# Patient Record
Sex: Male | Born: 1950 | Race: Black or African American | Hispanic: No | Smoking: Current every day smoker
Health system: Southern US, Community
[De-identification: ages and names within clinical notes are randomized; demographics above are authoritative.]

## PROBLEM LIST (undated history)

## (undated) ENCOUNTER — Emergency Department (HOSPITAL_COMMUNITY): Admission: EM | Payer: Medicare Other | Source: Home / Self Care

## (undated) DIAGNOSIS — Z992 Dependence on renal dialysis: Secondary | ICD-10-CM

## (undated) DIAGNOSIS — N186 End stage renal disease: Secondary | ICD-10-CM

## (undated) DIAGNOSIS — I10 Essential (primary) hypertension: Secondary | ICD-10-CM

## (undated) DIAGNOSIS — M1990 Unspecified osteoarthritis, unspecified site: Secondary | ICD-10-CM

## (undated) DIAGNOSIS — R0600 Dyspnea, unspecified: Secondary | ICD-10-CM

## (undated) DIAGNOSIS — I739 Peripheral vascular disease, unspecified: Secondary | ICD-10-CM

## (undated) DIAGNOSIS — K279 Peptic ulcer, site unspecified, unspecified as acute or chronic, without hemorrhage or perforation: Secondary | ICD-10-CM

## (undated) DIAGNOSIS — I639 Cerebral infarction, unspecified: Secondary | ICD-10-CM

## (undated) DIAGNOSIS — K219 Gastro-esophageal reflux disease without esophagitis: Secondary | ICD-10-CM

## (undated) DIAGNOSIS — R0609 Other forms of dyspnea: Secondary | ICD-10-CM

## (undated) DIAGNOSIS — Z9289 Personal history of other medical treatment: Secondary | ICD-10-CM

## (undated) DIAGNOSIS — E785 Hyperlipidemia, unspecified: Secondary | ICD-10-CM

## (undated) DIAGNOSIS — D649 Anemia, unspecified: Secondary | ICD-10-CM

## (undated) DIAGNOSIS — J449 Chronic obstructive pulmonary disease, unspecified: Secondary | ICD-10-CM

## (undated) DIAGNOSIS — Z87891 Personal history of nicotine dependence: Secondary | ICD-10-CM

## (undated) DIAGNOSIS — I5032 Chronic diastolic (congestive) heart failure: Secondary | ICD-10-CM

## (undated) DIAGNOSIS — H269 Unspecified cataract: Secondary | ICD-10-CM

## (undated) DIAGNOSIS — J45909 Unspecified asthma, uncomplicated: Secondary | ICD-10-CM

## (undated) HISTORY — DX: Peripheral vascular disease, unspecified: I73.9

## (undated) HISTORY — DX: Dyspnea, unspecified: R06.00

## (undated) HISTORY — DX: Personal history of nicotine dependence: Z87.891

## (undated) HISTORY — DX: Essential (primary) hypertension: I10

## (undated) HISTORY — PX: ARTERIOVENOUS GRAFT PLACEMENT: SUR1029

## (undated) HISTORY — PX: HERNIA REPAIR: SHX51

## (undated) HISTORY — DX: Other forms of dyspnea: R06.09

## (undated) HISTORY — DX: Hyperlipidemia, unspecified: E78.5

## (undated) HISTORY — DX: Unspecified osteoarthritis, unspecified site: M19.90

## (undated) HISTORY — DX: Chronic diastolic (congestive) heart failure: I50.32

## (undated) HISTORY — DX: Cerebral infarction, unspecified: I63.9

## (undated) HISTORY — PX: TOE AMPUTATION: SHX809

---

## 2000-10-20 ENCOUNTER — Encounter: Payer: Self-pay | Admitting: *Deleted

## 2000-10-20 ENCOUNTER — Ambulatory Visit (HOSPITAL_COMMUNITY): Admission: RE | Admit: 2000-10-20 | Discharge: 2000-10-20 | Payer: Self-pay | Admitting: *Deleted

## 2000-11-18 ENCOUNTER — Inpatient Hospital Stay (HOSPITAL_COMMUNITY): Admission: RE | Admit: 2000-11-18 | Discharge: 2000-11-18 | Payer: Self-pay | Admitting: *Deleted

## 2000-12-10 ENCOUNTER — Ambulatory Visit (HOSPITAL_COMMUNITY): Admission: RE | Admit: 2000-12-10 | Discharge: 2000-12-10 | Payer: Self-pay | Admitting: *Deleted

## 2000-12-10 HISTORY — PX: AV FISTULA PLACEMENT: SHX1204

## 2001-07-20 ENCOUNTER — Ambulatory Visit (HOSPITAL_COMMUNITY): Admission: RE | Admit: 2001-07-20 | Discharge: 2001-07-20 | Payer: Self-pay | Admitting: Nephrology

## 2002-06-14 ENCOUNTER — Ambulatory Visit (HOSPITAL_COMMUNITY): Admission: RE | Admit: 2002-06-14 | Discharge: 2002-06-14 | Payer: Self-pay | Admitting: Vascular Surgery

## 2002-06-14 ENCOUNTER — Encounter: Payer: Self-pay | Admitting: Vascular Surgery

## 2003-03-25 DIAGNOSIS — I639 Cerebral infarction, unspecified: Secondary | ICD-10-CM

## 2003-03-25 HISTORY — DX: Cerebral infarction, unspecified: I63.9

## 2005-01-13 ENCOUNTER — Ambulatory Visit: Payer: Self-pay | Admitting: Family Medicine

## 2005-01-30 ENCOUNTER — Ambulatory Visit: Payer: Self-pay | Admitting: Family Medicine

## 2005-03-21 ENCOUNTER — Ambulatory Visit: Payer: Self-pay | Admitting: Family Medicine

## 2005-05-08 ENCOUNTER — Ambulatory Visit: Payer: Self-pay | Admitting: Family Medicine

## 2005-05-15 ENCOUNTER — Ambulatory Visit: Payer: Self-pay | Admitting: Family Medicine

## 2005-12-29 ENCOUNTER — Ambulatory Visit: Payer: Self-pay | Admitting: Family Medicine

## 2006-01-07 ENCOUNTER — Ambulatory Visit: Payer: Self-pay | Admitting: Family Medicine

## 2006-04-29 ENCOUNTER — Ambulatory Visit: Payer: Self-pay | Admitting: Family Medicine

## 2006-05-01 ENCOUNTER — Ambulatory Visit: Payer: Self-pay | Admitting: Family Medicine

## 2006-06-11 ENCOUNTER — Ambulatory Visit: Payer: Self-pay | Admitting: Family Medicine

## 2007-11-08 ENCOUNTER — Ambulatory Visit: Payer: Self-pay | Admitting: Vascular Surgery

## 2008-01-11 ENCOUNTER — Ambulatory Visit: Payer: Self-pay | Admitting: Vascular Surgery

## 2008-07-22 ENCOUNTER — Ambulatory Visit: Payer: Self-pay | Admitting: Infectious Diseases

## 2008-07-22 ENCOUNTER — Inpatient Hospital Stay (HOSPITAL_COMMUNITY): Admission: EM | Admit: 2008-07-22 | Discharge: 2008-07-24 | Payer: Self-pay | Admitting: Infectious Diseases

## 2008-07-22 ENCOUNTER — Encounter: Payer: Self-pay | Admitting: Emergency Medicine

## 2008-07-22 DIAGNOSIS — I6350 Cerebral infarction due to unspecified occlusion or stenosis of unspecified cerebral artery: Secondary | ICD-10-CM | POA: Insufficient documentation

## 2008-07-24 ENCOUNTER — Ambulatory Visit: Payer: Self-pay | Admitting: Vascular Surgery

## 2008-07-24 ENCOUNTER — Encounter: Payer: Self-pay | Admitting: Internal Medicine

## 2008-07-26 ENCOUNTER — Encounter: Payer: Self-pay | Admitting: Internal Medicine

## 2008-08-10 ENCOUNTER — Telehealth: Payer: Self-pay | Admitting: *Deleted

## 2008-08-10 ENCOUNTER — Ambulatory Visit: Payer: Self-pay | Admitting: Internal Medicine

## 2008-08-10 ENCOUNTER — Encounter: Payer: Self-pay | Admitting: Internal Medicine

## 2008-08-10 DIAGNOSIS — G4700 Insomnia, unspecified: Secondary | ICD-10-CM

## 2008-08-24 ENCOUNTER — Telehealth: Payer: Self-pay | Admitting: Internal Medicine

## 2008-08-30 ENCOUNTER — Ambulatory Visit: Payer: Self-pay | Admitting: Gastroenterology

## 2008-09-13 ENCOUNTER — Ambulatory Visit: Payer: Self-pay | Admitting: Gastroenterology

## 2008-09-26 ENCOUNTER — Telehealth: Payer: Self-pay | Admitting: Internal Medicine

## 2008-09-27 ENCOUNTER — Telehealth: Payer: Self-pay | Admitting: Internal Medicine

## 2008-09-28 ENCOUNTER — Telehealth: Payer: Self-pay | Admitting: Internal Medicine

## 2008-10-05 ENCOUNTER — Telehealth: Payer: Self-pay | Admitting: Internal Medicine

## 2010-01-20 ENCOUNTER — Observation Stay (HOSPITAL_COMMUNITY): Admission: EM | Admit: 2010-01-20 | Discharge: 2010-01-21 | Payer: Self-pay | Admitting: Emergency Medicine

## 2010-05-22 NOTE — Discharge Summary (Signed)
  NAME:  [name], [name]              ACCOUNT NO.:  [id number]  MEDICAL RECORD NO.:  [id number]          PATIENT TYPE:  OBV  LOCATION:  A202                          FACILITY:  [facility]  PHYSICIAN:  Tyreona Panjwani L. Lendell [name], MDDATE OF BIRTH:  1950-10-17  DATE OF ADMISSION:  01/20/2010 DATE OF DISCHARGE:  10/31/2011LH                              DISCHARGE SUMMARY   DISCHARGE DIAGNOSES: 1. Anemia. 2. End-stage renal disease. 3. Gastroesophageal reflux disease. 4. Recent laparoscopic ventral hernia repair. 5. Hypertension. 6. Tobacco abuse.  DISCHARGE MEDICATIONS: 1. Tylenol 650 mg p.o. q.4 h. p.r.n. pain. 2. Stop BC powders. 3. Stop Aspirin. 4. Continue simvastatin 5 mg nightly. 5. KCl 20 mEq daily. 6. Multivitamin a day. 7. Sensipar 30 mg p.o. nightly. 8. Metoprolol XL 100 mg p.o. b.i.d. 9. Calcitriol 0.25 mcg daily. 10.Renagel 800 mg 4 tablets t.i.d. with meals. 11.Amlodipine 10 mg a day. 12.Lisinopril 30 mg a day. 13.Omeprazole 20 mg a day. 14.Xanax 0.5 mg p.o. q.4  h. p.r.n. anxiety.  CONDITION:  Stable.  ACTIVITY:  Ad lib.  CONSULTATIONS:  None.  PROCEDURES:  Transfusion of packed red blood cells.  FOLLOWUP:  Follow up with nephrologist.  LABORATORY DATA:  CBC on admission showed a hemoglobin of 6.2, hematocrit 18.7.  After transfusion, hemoglobin was 9.6, hematocrit 28.4.  Basic metabolic panel significant for glucose 147, sodium 133, potassium 3.5, chloride 95, BUN 20, creatinine 7.69, magnesium and phosphorus normal.  Iron 37, ferritin 1257, TIBC 213, B12 and folate were normal.  HISTORY AND HOSPITAL COURSE:  Please see H and P for details.  [name] is an unassigned 60 year old black male who gets dialysis in [place], [place].  He apparently had some outpatient labs drawn and was found to be anemic and told to present to the nearest emergency room.  He usually gets peritoneal dialysis, but has had a recent laparoscopic ventral hernia repair and is  currently getting hemodialysis. He had no evidence of ongoing active bleeding.  He was transfused 2 units of packed red blood cells.  Nephrology was consulted. He was dialyzed and subsequently sent home in stable condition.     Earl Kerstein L. Lendell Caprice, MD     CLS/MEDQ  D:  05/21/2010  T:  05/22/2010  Job:  [id number]  Electronically Signed by Crista Curb MD on 05/22/2010 03:56:23 PM

## 2010-06-05 LAB — DIFFERENTIAL
Basophils Absolute: 0.1 10*3/uL (ref 0.0–0.1)
Eosinophils Absolute: 0.4 10*3/uL (ref 0.0–0.7)
Lymphs Abs: 1.4 10*3/uL (ref 0.7–4.0)
Monocytes Absolute: 0.5 10*3/uL (ref 0.1–1.0)
Neutrophils Relative %: 73 % (ref 43–77)

## 2010-06-05 LAB — BASIC METABOLIC PANEL
BUN: 20 mg/dL (ref 6–23)
CO2: 27 mEq/L (ref 19–32)
Calcium: 8.5 mg/dL (ref 8.4–10.5)
Chloride: 95 mEq/L — ABNORMAL LOW (ref 96–112)
Creatinine, Ser: 7.69 mg/dL — ABNORMAL HIGH (ref 0.4–1.5)
GFR calc Af Amer: 9 mL/min — ABNORMAL LOW (ref >60)
GFR calc non Af Amer: 7 mL/min — ABNORMAL LOW (ref >60)
Glucose, Bld: 147 mg/dL — ABNORMAL HIGH (ref 70–99)
Potassium: 3.5 mEq/L (ref 3.5–5.1)
Sodium: 133 mEq/L — ABNORMAL LOW (ref 135–145)

## 2010-06-05 LAB — HEMOGLOBIN AND HEMATOCRIT, BLOOD: HCT: 28.4 % — ABNORMAL LOW (ref 39.0–52.0)

## 2010-06-05 LAB — CBC
HCT: 18.7 % — ABNORMAL LOW (ref 39.0–52.0)
Hemoglobin: 6.2 g/dL — CL (ref 13.0–17.0)
MCH: 30.5 pg (ref 26.0–34.0)
MCHC: 33.2 g/dL (ref 30.0–36.0)
MCV: 91.9 fL (ref 78.0–100.0)
Platelets: 124 10*3/uL — ABNORMAL LOW (ref 150–400)
RBC: 2.04 MIL/uL — ABNORMAL LOW (ref 4.22–5.81)
RDW: 20.6 % — ABNORMAL HIGH (ref 11.5–15.5)
WBC: 8.8 10*3/uL (ref 4.0–10.5)

## 2010-06-05 LAB — CROSSMATCH: Unit division: 0

## 2010-06-05 LAB — IRON AND TIBC: Saturation Ratios: 17 % — ABNORMAL LOW (ref 20–55)

## 2010-06-05 LAB — VITAMIN B12: Vitamin B-12: 1887 pg/mL — ABNORMAL HIGH (ref 211–911)

## 2010-06-05 LAB — MRSA PCR SCREENING: MRSA by PCR: NEGATIVE

## 2010-06-05 LAB — RETICULOCYTES
RBC.: 2.05 MIL/uL — ABNORMAL LOW (ref 4.22–5.81)
Retic Count, Absolute: 121 10*3/uL (ref 19.0–186.0)
Retic Ct Pct: 5.9 % — ABNORMAL HIGH (ref 0.4–3.1)

## 2010-06-05 LAB — HEPATITIS B SURFACE ANTIBODY,QUALITATIVE: Hep B S Ab: POSITIVE — AB

## 2010-06-05 LAB — MAGNESIUM: Magnesium: 1.8 mg/dL (ref 1.5–2.5)

## 2010-06-05 LAB — PHOSPHORUS: Phosphorus: 3.8 mg/dL (ref 2.3–4.6)

## 2010-06-05 LAB — FOLATE: Folate: >20 ng/mL

## 2010-06-05 LAB — HEPATITIS B SURFACE ANTIGEN: Hepatitis B Surface Ag: NEGATIVE

## 2010-06-05 LAB — ALT: ALT: 12 U/L (ref 0–53)

## 2010-06-28 ENCOUNTER — Other Ambulatory Visit (HOSPITAL_COMMUNITY): Payer: Self-pay | Admitting: Nephrology

## 2010-06-28 ENCOUNTER — Ambulatory Visit (HOSPITAL_COMMUNITY)
Admission: RE | Admit: 2010-06-28 | Discharge: 2010-06-28 | Disposition: A | Discharge status: Home / Self Care | Payer: Medicare Other | Source: Ambulatory Visit | Attending: Nephrology | Admitting: Nephrology

## 2010-06-28 DIAGNOSIS — K219 Gastro-esophageal reflux disease without esophagitis: Secondary | ICD-10-CM | POA: Insufficient documentation

## 2010-06-28 DIAGNOSIS — N186 End stage renal disease: Secondary | ICD-10-CM

## 2010-06-28 DIAGNOSIS — Y849 Medical procedure, unspecified as the cause of abnormal reaction of the patient, or of later complication, without mention of misadventure at the time of the procedure: Secondary | ICD-10-CM | POA: Insufficient documentation

## 2010-06-28 DIAGNOSIS — T82598A Other mechanical complication of other cardiac and vascular devices and implants, initial encounter: Secondary | ICD-10-CM | POA: Insufficient documentation

## 2010-06-28 DIAGNOSIS — E119 Type 2 diabetes mellitus without complications: Secondary | ICD-10-CM | POA: Insufficient documentation

## 2010-06-28 DIAGNOSIS — I120 Hypertensive chronic kidney disease with stage 5 chronic kidney disease or end stage renal disease: Secondary | ICD-10-CM | POA: Insufficient documentation

## 2010-06-28 DIAGNOSIS — D649 Anemia, unspecified: Secondary | ICD-10-CM | POA: Insufficient documentation

## 2010-07-02 LAB — LIPID PANEL
Cholesterol: 147 mg/dL (ref 0–200)
HDL: 33 mg/dL — ABNORMAL LOW (ref >39)
Triglycerides: 72 mg/dL (ref <150)

## 2010-07-02 LAB — CBC
MCHC: 33.5 g/dL (ref 30.0–36.0)
MCV: 88.5 fL (ref 78.0–100.0)
Platelets: 250 10*3/uL (ref 150–400)
Platelets: 282 10*3/uL (ref 150–400)
WBC: 5.2 10*3/uL (ref 4.0–10.5)
WBC: 6.1 10*3/uL (ref 4.0–10.5)

## 2010-07-02 LAB — HEPATIC FUNCTION PANEL
ALT: 18 U/L (ref 0–53)
AST: 25 U/L (ref 0–37)
Alkaline Phosphatase: 65 U/L (ref 39–117)
Bilirubin, Direct: 0.1 mg/dL (ref 0.0–0.3)
Total Protein: 6 g/dL (ref 6.0–8.3)

## 2010-07-02 LAB — BASIC METABOLIC PANEL
BUN: 48 mg/dL — ABNORMAL HIGH (ref 6–23)
BUN: 44 mg/dL — ABNORMAL HIGH (ref 6–23)
CO2: 30 mEq/L (ref 19–32)
Calcium: 8.1 mg/dL — ABNORMAL LOW (ref 8.4–10.5)
Calcium: 8.2 mg/dL — ABNORMAL LOW (ref 8.4–10.5)
Chloride: 98 mEq/L (ref 96–112)
Creatinine, Ser: 15.91 mg/dL — ABNORMAL HIGH (ref 0.4–1.5)
Creatinine, Ser: 16.54 mg/dL — ABNORMAL HIGH (ref 0.4–1.5)
Creatinine, Ser: 14.8 mg/dL — ABNORMAL HIGH (ref 0.4–1.5)
GFR calc non Af Amer: 3 mL/min — ABNORMAL LOW (ref >60)
Glucose, Bld: 75 mg/dL (ref 70–99)

## 2010-07-02 LAB — TSH: TSH: 1.2 u[IU]/mL (ref 0.350–4.500)

## 2010-07-02 LAB — PROTIME-INR
INR: 1.1 (ref 0.00–1.49)
Prothrombin Time: 14.1 seconds (ref 11.6–15.2)

## 2010-07-02 LAB — DIFFERENTIAL
Eosinophils Absolute: 0.4 10*3/uL (ref 0.0–0.7)
Lymphocytes Relative: 23 % (ref 12–46)
Lymphs Abs: 1.4 10*3/uL (ref 0.7–4.0)
Neutro Abs: 4 10*3/uL (ref 1.7–7.7)
Neutrophils Relative %: 65 % (ref 43–77)

## 2010-07-02 LAB — RENAL FUNCTION PANEL
Albumin: 2.3 g/dL — ABNORMAL LOW (ref 3.5–5.2)
Calcium: 7.8 mg/dL — ABNORMAL LOW (ref 8.4–10.5)
Chloride: 98 mEq/L (ref 96–112)
Creatinine, Ser: 15.76 mg/dL — ABNORMAL HIGH (ref 0.4–1.5)
GFR calc Af Amer: 4 mL/min — ABNORMAL LOW (ref >60)
GFR calc non Af Amer: 3 mL/min — ABNORMAL LOW (ref >60)
Sodium: 137 mEq/L (ref 135–145)

## 2010-07-23 DIAGNOSIS — I5032 Chronic diastolic (congestive) heart failure: Secondary | ICD-10-CM

## 2010-07-23 HISTORY — DX: Chronic diastolic (congestive) heart failure: I50.32

## 2010-07-31 ENCOUNTER — Inpatient Hospital Stay (HOSPITAL_COMMUNITY)
Admission: AD | Admit: 2010-07-31 | Discharge: 2010-08-02 | DRG: 291 | Disposition: A | Discharge status: Home / Self Care | Payer: Medicare Other | Source: Ambulatory Visit | Attending: Internal Medicine | Admitting: Internal Medicine

## 2010-07-31 ENCOUNTER — Emergency Department (HOSPITAL_COMMUNITY): Payer: Medicare Other

## 2010-07-31 ENCOUNTER — Inpatient Hospital Stay (HOSPITAL_COMMUNITY): Payer: Medicare Other

## 2010-07-31 DIAGNOSIS — I509 Heart failure, unspecified: Secondary | ICD-10-CM | POA: Diagnosis present

## 2010-07-31 DIAGNOSIS — E785 Hyperlipidemia, unspecified: Secondary | ICD-10-CM | POA: Diagnosis present

## 2010-07-31 DIAGNOSIS — J9600 Acute respiratory failure, unspecified whether with hypoxia or hypercapnia: Secondary | ICD-10-CM | POA: Diagnosis present

## 2010-07-31 DIAGNOSIS — Z992 Dependence on renal dialysis: Secondary | ICD-10-CM

## 2010-07-31 DIAGNOSIS — F17200 Nicotine dependence, unspecified, uncomplicated: Secondary | ICD-10-CM | POA: Diagnosis present

## 2010-07-31 DIAGNOSIS — I120 Hypertensive chronic kidney disease with stage 5 chronic kidney disease or end stage renal disease: Secondary | ICD-10-CM | POA: Diagnosis present

## 2010-07-31 DIAGNOSIS — I5031 Acute diastolic (congestive) heart failure: Principal | ICD-10-CM | POA: Diagnosis present

## 2010-07-31 DIAGNOSIS — Z8673 Personal history of transient ischemic attack (TIA), and cerebral infarction without residual deficits: Secondary | ICD-10-CM

## 2010-07-31 DIAGNOSIS — N186 End stage renal disease: Secondary | ICD-10-CM | POA: Diagnosis present

## 2010-07-31 DIAGNOSIS — D631 Anemia in chronic kidney disease: Secondary | ICD-10-CM | POA: Diagnosis present

## 2010-07-31 LAB — HEPATIC FUNCTION PANEL
ALT: 10 U/L (ref 0–53)
AST: 15 U/L (ref 0–37)
Albumin: 3.1 g/dL — ABNORMAL LOW (ref 3.5–5.2)
Alkaline Phosphatase: 81 U/L (ref 39–117)
Total Protein: 7.4 g/dL (ref 6.0–8.3)

## 2010-07-31 LAB — BASIC METABOLIC PANEL
BUN: 49 mg/dL — ABNORMAL HIGH (ref 6–23)
CO2: 31 mEq/L (ref 19–32)
Calcium: 10.3 mg/dL (ref 8.4–10.5)
Creatinine, Ser: 8.98 mg/dL — ABNORMAL HIGH (ref 0.4–1.5)
GFR calc Af Amer: 7 mL/min — ABNORMAL LOW (ref >60)
Glucose, Bld: 96 mg/dL (ref 70–99)

## 2010-07-31 LAB — BLOOD GAS, ARTERIAL
Acid-Base Excess: 1.5 mmol/L (ref 0.0–2.0)
Bicarbonate: 27.6 mEq/L — ABNORMAL HIGH (ref 20.0–24.0)
O2 Content: 100 L/min
O2 Saturation: 99.2 %
TCO2: 2.65 mmol/L (ref 0–100)
pO2, Arterial: 187 mmHg — ABNORMAL HIGH (ref 80.0–100.0)

## 2010-07-31 LAB — DIFFERENTIAL
Basophils Absolute: 0.1 10*3/uL (ref 0.0–0.1)
Basophils Relative: 2 % — ABNORMAL HIGH (ref 0–1)
Monocytes Absolute: 0.3 10*3/uL (ref 0.1–1.0)
Neutro Abs: 3.7 10*3/uL (ref 1.7–7.7)
Neutrophils Relative %: 65 % (ref 43–77)

## 2010-07-31 LAB — POCT CARDIAC MARKERS: Myoglobin, poc: 318 ng/mL (ref 12–200)

## 2010-07-31 LAB — CBC
Hemoglobin: 10.1 g/dL — ABNORMAL LOW (ref 13.0–17.0)
MCHC: 29.4 g/dL — ABNORMAL LOW (ref 30.0–36.0)

## 2010-07-31 LAB — CARDIAC PANEL(CRET KIN+CKTOT+MB+TROPI): CK, MB: 2.3 ng/mL (ref 0.3–4.0)

## 2010-08-01 ENCOUNTER — Inpatient Hospital Stay (HOSPITAL_COMMUNITY): Payer: Medicare Other

## 2010-08-01 LAB — BASIC METABOLIC PANEL
BUN: 36 mg/dL — ABNORMAL HIGH (ref 6–23)
Calcium: 10.6 mg/dL — ABNORMAL HIGH (ref 8.4–10.5)
GFR calc non Af Amer: 9 mL/min — ABNORMAL LOW (ref >60)
Potassium: 4.4 mEq/L (ref 3.5–5.1)
Sodium: 138 mEq/L (ref 135–145)

## 2010-08-01 LAB — CBC
HCT: 33.7 % — ABNORMAL LOW (ref 39.0–52.0)
MCHC: 30 g/dL (ref 30.0–36.0)
Platelets: 193 10*3/uL (ref 150–400)
RDW: 21.9 % — ABNORMAL HIGH (ref 11.5–15.5)
WBC: 3.7 10*3/uL — ABNORMAL LOW (ref 4.0–10.5)

## 2010-08-01 LAB — RENAL FUNCTION PANEL
Albumin: 3.2 g/dL — ABNORMAL LOW (ref 3.5–5.2)
Chloride: 98 mEq/L (ref 96–112)
GFR calc Af Amer: 10 mL/min — ABNORMAL LOW (ref >60)
GFR calc non Af Amer: 8 mL/min — ABNORMAL LOW (ref >60)
Phosphorus: 4.3 mg/dL (ref 2.3–4.6)
Potassium: 4.4 mEq/L (ref 3.5–5.1)

## 2010-08-01 LAB — DIFFERENTIAL
Basophils Absolute: 0 10*3/uL (ref 0.0–0.1)
Basophils Relative: 1 % (ref 0–1)
Eosinophils Relative: 0 % (ref 0–5)
Lymphocytes Relative: 13 % (ref 12–46)

## 2010-08-01 LAB — MAGNESIUM: Magnesium: 2.3 mg/dL (ref 1.5–2.5)

## 2010-08-02 ENCOUNTER — Inpatient Hospital Stay (HOSPITAL_COMMUNITY): Payer: Medicare Other

## 2010-08-03 NOTE — H&P (Signed)
NAME:  [name], [name] NO.:  [id number]  MEDICAL RECORD NO.:  [id number]           PATIENT TYPE:  I  LOCATION:  IC11                          FACILITY:  [facility]  PHYSICIAN:  Vania Rea, M.D. DATE OF BIRTH:  [birth year]  DATE OF ADMISSION:  07/31/2010 DATE OF DISCHARGE:  LH                             HISTORY & PHYSICAL   PRIMARY CARE PHYSICIAN:  Dorise Hiss, MD nephrologist in [place], [place].  CHIEF COMPLAINT:  Difficulty breathing since this morning.  HISTORY OF PRESENT ILLNESS:  This is a 60 year old African American gentleman with a history of end-stage renal disease, dialysis dependent, also a hypertension who gets dialyzed Mondays, Wednesdays and Fridays and according to his wife had a normal dialysis on Monday, was doing okay.  They both have upper respiratory infections for the past few days and she is noticed she has been having increasing coughing and shortness of breath since yesterday.  She woke early this morning to find him calling an ambulance and very short of breath.  He was brought to the emergency room by ambulance arriving at 4:30 a.m.  Investigations revealed markedly elevated blood pressure, respiratory distress requiring the support with non-rebreather.  He did get albuterol nebs and the Hospitalist Service was called to assist with management.  The patient is having too much distress to give a history, but there has been reportedly no history of fever, no chest pains, no nausea nor vomiting.  He has chronic mild lower extremity edema, but has not been having worse lower extremity swelling.  He has no past history of coronary artery disease.  He does smoke about half a pack of cigarettes per day.  PAST MEDICAL HISTORY: 1. End-stage renal disease, dialysis dependent.  Used to be on     peritoneal dialysis the last year when he had umbilical hernia     surgery and then he switched to hemodialysis. 2. Hypertension. 3.  Tobacco abuse. 4. Past history of right hemispheric stroke in 2005.  No residual     deficit. 5. He is status post umbilical hernia repair last year, status post     placement of right upper arm shunt.  MEDICATIONS:  Unable to obtain because the patient's current status and his wife does not know them; however, when he was discharged from this facility last October, his medications at that time included Tylenol, simvastatin, potassium, multivitamin, Sensipar, metoprolol 100 mg twice a day, calcitriol, Renagel, amlodipine, lisinopril, omeprazole and Xanax.  ALLERGIES:  No known drug allergies.  His wife reports past history of heavy alcohol abuse, long history of tobacco abuse now half-a-pack per day.  Denies illicit drug.  He is currently on disability.  FAMILY HISTORY:  Significant for multiple first-degree family relatives with throat cancer including brothers and father.  His mother had breast cancer.  REVIEW OF SYSTEMS:  Other than noted above, unable to obtain.  PHYSICAL EXAMINATION:  GENERAL:  A middle-aged African American gentleman, lying in the stretcher, appears somewhat lethargic, but arousable with deep pressure.  He is on 100% non-rebreather saturating at 100%.  He has dialysis catheter in his left upper  left subclavian area and note he also he has a nonfunctioning graft in his right upper arm and forearm. VITAL SIGNS:  Temperature is 99.9, his pulse is 73, respiration 26, blood pressure on initial presentation 215/99, currently 176/82. HEENT:  Pupils are round and equal.  Mucous membranes pale.  Anicteric. He is not cyanotic. NECK:  No cervical lymphadenopathy, has markedly distended external jugular veins. PULMONARY:  He has prolonged expiration and diffuse rhonchi bilaterally. No crackles are heard. CARDIOVASCULAR SYSTEM:  Regular rhythm, 2/6 systolic murmurs. ABDOMEN:  Distended, but soft and nontender. EXTREMITIES:  He has a trace edema bilaterally. CENTRAL  NERVOUS SYSTEM:  Within the limits of the exam.  He has no lateralizing signs.  LABORATORY DATA:  His white count is 5.3, hemoglobin 10.1, platelets 258.  He has a normal differential.  His cardiac enzymes; his myoglobin is elevated at 318, his troponin is undetectable, CK-MB 2.5.  His sodium is 143, potassium 5.0, chloride 101, CO2 31, glucose 96, BUN 49, creatinine 8.98, calcium 10.3.  His pro-B natriuretic peptide is greater than 35,000.  Portable chest x-ray shows pulmonary edema and small bilateral pulmonary effusion.  ASSESSMENT: 1. Acute respiratory failure. 2. Acute pulmonary edema causing the above. 3. Hypertensive emergency. 4. End-stage renal disease, dialysis dependent. 5. Chronic anemia due to end-stage renal disease, stable. 6. Tobacco abuse.  PLAN: 1. We will admit this gentleman to the intensive care for respiratory     support and contact Dr. Kristian Covey for emergent dialysis and at this     time, this had already been done. 2. We will check his ABG to see if there is any degree of CO2     retention, which causing his lethargy, but if he     seems to be having fatigue from over breathing, he may require     BiPAP or intubation for respiratory support. 3. We will use nitropaste to his chest and intravenous Vasotec for     control of his blood pressure. 4. Other plans as per orders.   Critical Care time: 60 minutes  Vania Rea, M.D.     LC/MEDQ  D:  07/31/2010  T:  07/31/2010  Job:  [id number]  cc:   Dorise Hiss, M.D. Fax: [phone]  Jorja Loa, M.D. Fax: [phone]  Electronically Signed by Vania Rea M.D. on 08/03/2010 12:22:37 AM

## 2010-08-03 NOTE — Discharge Summary (Signed)
NAME:  [name], [name] NO.:  [id number]  MEDICAL RECORD NO.:  [id number]           PATIENT TYPE:  I  LOCATION:  A307                          FACILITY:  [facility]  PHYSICIAN:  Hillery Aldo, M.D.   DATE OF BIRTH:  04-Sep-1950  DATE OF ADMISSION:  07/31/2010 DATE OF DISCHARGE:  05/11/2012LH                              DISCHARGE SUMMARY   PRIMARY CARE PHYSICIAN:  Dr. Lucianne Lei in [place].  DISCHARGE DIAGNOSES: 1. Acute respiratory failure secondary to acute diastolic congestive     heart failure and pulmonary edema. 2. End-stage renal disease. 3. Accelerated hypertension. 4. Tobacco abuse. 5. Cerebrovascular disease with history of stroke. 6. Dyslipidemia. 7. Chronic normocytic anemia of chronic disease. 8. History of coronary artery disease. 9. History of peripheral vascular disease. 10.History of hyperphosphatemia. 11.Gastroesophageal reflux disease.  DISCHARGE MEDICATIONS: 1. Aspirin 81 mg p.o. daily. 2. Epogen 10,000 units per mL, 8000 units IV q. Monday, Wednesday,     Friday with hemodialysis. 3. Robitussin DM 10 mL p.o. q.4 h p.r.n. cough. 4. Imdur 30 mg p.o. daily. 5. Acetaminophen 650 mg p.o. q.4 h p.r.n. pain. 6. Lisinopril 30 mg p.o. daily. 7. Metoprolol XL 100 mg p.o. b.i.d. 8. Xanax 0.5 mg p.o. q.4 h p.r.n. anxiety.  CONSULTATIONS:  Dr. Kristian Covey of Nephrology.  BRIEF ADMISSION HPI:  The patient is a 60 year old male with end-stage renal disease who presented to the hospital with a chief complaint of worsening shortness of breath progressing to respiratory distress prompting him to call an ambulance for evaluation in transport to the hospital.  Upon evaluation by the emergency department physicians, the patient was found to have accelerated hypertension and respiratory distress necessitating the use of a non-rebreather oxygen mask.  The patient subsequently was referred to the hospitalist service for further evaluation and  treatment.  For the full details, please see the dictated report done by Dr. Orvan Falconer.  PROCEDURES AND DIAGNOSTIC STUDIES: 1. Chest x-ray on Jul 31, 2010, showed the interval development of     congestive changes in the heart and lungs with pulmonary edema and     small bilateral pleural effusions. 2. Repeat chest x-ray on Aug 01, 2010, showed slight improvement in     pulmonary edema and small bilateral pleural effusions.  DISCHARGE LABORATORY VALUES:  White blood cell count was 3.7, hemoglobin 10.1, hematocrit 33.7, platelets 193, magnesium was 2.3.  Sodium was 138, potassium 4.4, chloride 98, bicarb 27, BUN 36, creatinine 6.66, glucose 163, calcium 10.6, albumin 3.2, and phosphorus 4.3.  HOSPITAL COURSE BY PROBLEM: 1. Acute respiratory failure secondary to acute diastolic congestive     heart failure and pulmonary edema:  The patient was admitted and     his congestive heart failure was felt to be due to diastolic     dysfunction in the setting of volume overload.  The patient was put     on Nitro paste, a non-rebreather oxygen mask, and monitored closely     in the ICU.  He underwent emergent hemodialysis with 4 liters of     ultrafiltrate removed.  His respiratory status stabilized post  dialysis and a repeat chest x-ray showed improvement in his     congestive failure.  The patient does have a recent echocardiogram     report from Jul 24, 2008, which shows grade 1 diastolic dysfunction.     An echocardiogram was not repeated during the course of this     hospital stay.  Given his rapid resolution of symptoms with     dialysis.  At this point, the patient is maintaining his oxygen     saturations on room air and will be discharged home after his     hemodialysis treatment today. 2. End-stage renal disease:  The patient was seen in consultation with     Dr. Kristian Covey and dialysis was performed on Jul 31, 2010 and again on     Aug 02, 2010 prior to discharge. 3. Accelerated  hypertension:  The patient's presenting blood pressure     was markedly elevated at 215/99.  The patient was put on Nitro     paste and IV Vasotec.  His blood pressure improved and was     significantly better after hemodialysis and resumption of his     normal antihypertensives.  The patient will be discharged on Imdur     as well. 4. Tobacco abuse:  The patient was maintained on nicotine patch while     in the hospital and has been encouraged to discontinue tobacco use. 5. Cerebrovascular disease with history of stroke:  The patient has     been stable.  He was put on low-dose aspirin therapy.  It is     unclear if he is taking this prior to admission. 6. Dyslipidemia:  The patient was maintained on a heart-healthy diet.     He should follow up with his primary care physician for further     surveillance and the initiation of pharmacologic therapy if     indicated. 7. Chronic normocytic anemia secondary to end-stage renal disease:     The patient was maintained on Procrit and his hemoglobin and     hematocrit have been stable.  The patient's other chronic medical problems have been stable.  DISPOSITION:  The patient is medically stable and be discharged home after hemodialysis today.  DISCHARGE INSTRUCTIONS:  Discontinue smoking.  Follow up with Dr. Lucianne Lei on Monday for your regularly scheduled hemodialysis treatment.  DISCHARGE DIET:  Heart-healthy, renal  CONDITION ON DISCHARGE:  Improved.  Time spent coordinating care for discharge and discharge instructions equals 35 minutes.     Hillery Aldo, M.D.     CR/MEDQ  D:  08/02/2010  T:  08/02/2010  Job:  [id number]  cc:   Dorise Hiss, M.D. Fax: [phone]  Electronically Signed by Hillery Aldo M.D. on 08/03/2010 04:29:54 PM

## 2010-08-06 NOTE — Assessment & Plan Note (Signed)
OFFICE VISIT   JAHMAI, FINELLI  DOB:  1951-02-05                                       01/11/2008  YQMVH#:[id number]   The patient returns today for lower extremity claudication symptoms.  I  saw him in August of this year for the same symptoms.  He has occasional  numbness in his feet at night, which I think is due to neuropathy.  He  has no history of nonhealing ulcers or infection, bur does have  claudication in both calves, right equal to left, after about 1/4 of a  block.  He has to rest about 10 minutes before proceeding.  He has  history of nonhealing ulcers.   EXAM:  Blood pressure 150/84, heart rate 66, respirations 14.  His lower  extremity pulses continue to be 3+ at the femoral level.  Popliteal  pulses and distal pulses are difficult to palpate.  Both feet are  adequately perfused with no evidence of infection or acute ischemia, and  no tenderness in the calf or foot.   His pressures in the feet today are noncompressible because of calcified  vessels bilaterally.  Left does seem to be worse than the right, but he  does seem to have adequate flow in his feet for viability.  He states  that his symptoms are equal right and left, and I do not think anything  needs to be done at this time unless his symptoms worsen and it becomes  a limb-salvage or limb-threatening situation.  He will return to see [place]  if that occurs.   Quita Skye Hart [place], M.D.  Electronically Signed   JDL/MEDQ  D:  01/11/2008  T:  01/12/2008  Job:  [id number]   cc:   Dorise Hiss, M.D.

## 2010-08-06 NOTE — Consult Note (Signed)
VASCULAR SURGERY CONSULTATION   JAYION, SCHNECK  DOB:  1950-12-08                                       11/08/2007  VWUJW#:[id number]   The patient is a 60 year old male patient with end-stage renal disease  currently on peroneal dialysis.  In the past he has had access in both  upper extremities.  Initially the left side followed by fistula the  right upper arm.  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  He has apparently been doing well on that.  He  has had chronic edema in the right upper extremity secondary to the  proximal vein occlusion.  He states that is no worse now and has been in  the past.  The fistula is still patent but the edema has not been  severe.  He does have discomfort in both legs at night, he states that  he describes as a like a tooth ache when his legs headache.  He has no  numbness in the feet at night.  Has no history of nonhealing ulcers or  infection.  Does have claudication in both calves beginning at about a  quarter of a block.  He must then rest for 10-20 minutes before resuming  his walking.  He states that this is limiting him somewhat he is able to  cope.   PAST MEDICAL HISTORY:  1. Hypertension.  2. End-stage renal disease on hemodialysis.  3. Negative coronary artery disease or diabetes.   SOCIAL HISTORY:  Married, has 3 children is retired.  Smokes a pack  cigarettes per day.  Does not use alcohol.   ALLERGIES:  None known.   PHYSICAL EXAM:  Blood pressure 132/78, heart rate 56, respirations are  18.  General:  He is a middle-aged male in no apparent stress alert and  x3.  Neck is supple 3+ carotid pulses palpable.  No bruits are audible.  Neurologic:  Normal with no palpable adenopathy in the neck.  Right  upper extremity has 1+ edema with functional AV  fistula in the right  upper arm with multiple areas of aneurysmal dilatation and very weak  flow in as one progresses up the arm.  Left arm has old thrombosed graft  in place.  Abdomen:  Soft, nontender with no masses.  Femoral pulses are  3+ on the right and 2+ on the left with no popliteal or distal pulses.  Both feet are warm, well-perfused.   Lower extremity arterial Dopplers done at the [facility]  revealed calcified vessels in the lower extremities with unable to  calculate ABIs.  He did have biphasic and triphasic flow with femoral  levels bilaterally.   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  His symptoms are not  disabling and has no other risk of limb loss at present time.  I do not  think his night symptoms are related to his vascular insufficiency.  He  does not feel symptoms are bad enough to warrant angiography and  possible procedures at this time he states.  If his symptoms worsen,  please let me know.  I do not  think anything needs to be done about the  edema in the right upper arm, which is chronic and the fistula is no  longer being utilized.   Quita Skye Hart [place], M.D.  Electronically Signed  JDL/MEDQ  D:  11/08/2007  T:  11/09/2007  Job:  [id number]   cc:   Dorise Hiss, M.D.

## 2010-08-06 NOTE — Procedures (Signed)
DUPLEX DEEP VENOUS EXAM - UPPER EXTREMITY   INDICATION:  Chronic right arm and neck edema.   HISTORY:  Edema:  Chronic right arm and neck edema.  Trauma/Surgery:  Patient has a right arm AV fistula.  Two years ago,  retroperitoneal dialysis access was placed secondary to a clot.  The  right arm AV fistula is still patent.  Pain:  Right arm pain.  PE:  Previous DVT:  Patient said he had a history of clot in his right arm  veins, which is why the dialysis access was placed two years ago.  Anticoagulants:  Other:   DUPLEX EXAM:                                             Bas/                IJV   SCV     AXV    BrachV  Ceph V                R  L  R   L   R  L   R   L   R  L  Thrombosis    o     +   o   o      o       o  Spontaneous   +     +   +   +      +       +  Phasic        +     o   +   o      o       o  Augmentation        o   +   o      o       o  Compressible  +     o   +   +      +       +  Competent     +     o   +   o      o       o  Legend:  + - yes  o - no  p - partial  D - decreased    IMPRESSION:  Right arm arteriovenous fistula is patent.  The right  subclavian vein at the cephalic vein origin is retrograde.  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   ___________________________________________  Quita Skye Hart [place], M.D.   MC/MEDQ  D:  11/08/2007  T:  11/08/2007  Job:  [id number]

## 2010-08-06 NOTE — Consult Note (Signed)
NAME:  [name], [name] NO.:  [id number]   MEDICAL RECORD NO.:  [id number]          PATIENT TYPE:  INP   LOCATION:  3003                         FACILITY:  [facility]   PHYSICIAN:  Maree Krabbe, M.D.DATE OF BIRTH:  1950-09-03   DATE OF CONSULTATION:  07/22/2008  DATE OF DISCHARGE:                                 CONSULTATION   REASON FOR CONSULTATION:  Management of peritoneal dialysis.   CHIEF COMPLAINT:  Slurred speech.   HISTORY OF PRESENT ILLNESS:  [name] is a pleasant 60 year old male  with end-stage renal disease who undergoes daily peritoneal dialysis at  home.  He has been on dialysis for approximately 7 years, and on  peritoneal dialysis for the last 3 years.  He sees Dr. Anne [name] every month  who is based up in [place].  His last appointment was 1 week ago.  He  stated today he woke up and noticed he was slurring his speech.  His  wife also noticed a right-sided facial droop, however, of note it has  seemed to progress over now to a left-sided facial droop.  Wife is very  clear when she states it started on the right side and is now on the  left.  When he went to bed the night before, he was completely normal.  He states he has been feeling well.  He denies any previous history of  stroke or coronary artery disease.  Upon finding these symptoms this  morning, he went to [facility], but was transferred to [facility] so the neurologic service could see him.  He has been admitted by  the internal medicine teaching service and we are consulted to manage  his peritoneal dialysis.  He completed his usual peritoneal dialysis  routine last night.   REVIEW OF SYSTEMS:  He denies nausea, vomiting, diarrhea, fevers,  weakness, sore throat, abdominal pain, chest pain, difficulty breathing,  headaches, and visual changes.  He does endorse a chronic dry cough.   PAST MEDICAL HISTORY:  1. 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  2. Hypertension.  3. Tobacco abuse.   HOME MEDICATIONS:  1. Sensipar 90 mg p.o. daily.  2. Rena-Vite 1 tablet p.o. daily.  3. Toprol ER 100 mg p.o. daily.  4. Zemplar 1 mcg p.o. daily.  5. Norvasc 10 mg p.o. daily.  6. Alprazolam 1 mg p.o. daily.  7. Renagel 4 tabs with each meal, exact dosing unknown.   ALLERGIES:  No known drug allergies.   FAMILY HISTORY:  Negative for renal disease and cerebrovascular  accident.   SOCIAL HISTORY:  He is married, with 3 healthy children.  He lives with  his wife.  He has smoked 1 pack per day for at least the last 30 years.  He denies alcohol or drugs.   PHYSICAL EXAMINATION:  VITAL SIGNS:  His temperature is 98.0, pulse 54,  respirations 20, blood pressure 139/74, oxygen saturation 99%.  GENERAL:  He  is well developed, well hydrated, in no acute distress, and  alert and oriented x3.  HEENT:  Left-sided facial droop.  His extraocular movements are intact.  His mucous membranes are moist.  His sclerae are hypervascular.  NECK:  No masses and full range of motion.  CV:  Regular rate and rhythm without murmur.  He has a 2+ left radial  pulse, but a diminished right radial pulse.  LUNGS:  Occasional faint end-expiratory wheezing.  Coarse breath sounds  throughout, but no crackles and has normal work of breathing.  ABDOMEN:  Soft, nontender, nondistended with positive bowel sounds.  He  has a dialysis catheter in place with no drainage, or discharge, or  surrounding erythema.  EXTREMITIES:  No edema in the lower extremities.  However, his right  upper extremity has multiple clots and postsurgical changes from  previous fistula and graft.  His right upper extremity is larger than  his left.  NEURO:  Left-sided facial droop, but is otherwise intact.  His left arm  does have 4/5 strength, but states this is chronic.   CT of the head shows no acute  hemorrhage.  It does show chronic small  vessel ischemic changes and a possible old left cerebral infarct.   LABORATORY DATA:  Sodium is 139, potassium 3.3, chloride 96, bicarb 29,  BUN 44, creatinine 14.8, glucose 129, and calcium of 7.9.  His INR is  1.1.  CBC revealed a white count of 6.1 and a hemoglobin of 12.5 with  platelets of 282.  Magnesium is pending.  Phosphorus is pending.  Liver  function tests including albumin are still pending.   ASSESSMENT AND PLAN:  This is a 60 year old male with a history of end-  stage renal disease, on daily peritoneal dialysis with an acute stroke.  1. End-stage renal disease.  We will continue his peritoneal dialysis      while he is in the hospital per his usual routine.  2. Secondary hyperparathyroidism.  Continue his Sensipar, Zemplar, and      Renagel per home dosing.  3. Hypertension.  His blood pressure right now is in the next upper      range.  Given his current stroke, we would not want to lower his      blood pressure acutely, anyhow blood pressure medications are held      for now.  I will restart them in the morning if blood pressure      elevates.  4. Cerebrovascular accident.  This is being followed per primary team      by Neurology.      Earl Garland, MD  Electronically Signed      Maree Krabbe, M.D.  Electronically Signed    LM/MEDQ  D:  07/22/2008  T:  07/23/2008  Job:  [id number]

## 2010-08-09 NOTE — Op Note (Signed)
[place]. [facility]  Patient:    [name], [name] Visit Number: [id number] MRN: [id number]          Service Type: DSU Location: [phone] Attending Physician:  Caralee Ates. Proc. Date: 11/17/00 Adm. Date:  11/17/2000 Disc. Date: 11/18/2000                             Operative Report  PREOPERATIVE DIAGNOSIS:  Thrombosed left forearm arteriovenous graft.  POSTOPERATIVE DIAGNOSIS:  Thrombosed left forearm arteriovenous graft plus left forearm arteriovenous graft infection.  OPERATION PERFORMED:  Removal of left forearm arteriovenous graft with vein patch angioplasty of left brachial artery.  SURGEON:  Caralee Ates, M.D.  Threasa HeadsMelvyn Neth.  ESTIMATED BLOOD LOSS:  20 cc.  DRAINS:  1/4 inch Penrose.  SPECIMENS:  Aerobic and anaerobic culture specimens.  COMPLICATIONS:  None.  ANESTHESIA:  MAC plus 15 cc of 1% lidocaine local.  INDICATIONS FOR PROCEDURE:  The patient is a 60 year old black male with end-stage renal disease who had been hemodialysis via left forearm AV loop graft.  He recently had a thrombectomy of his loop graft with revision of the venous limb to above elbow basilic vein; however, this has since thrombosed. He was scheduled for a thrombectomy today.  He had no signs of systemic infection or local wound infections.  DESCRIPTION OF PROCEDURE:  The patient was brought to the operating room and placed on the operating table in supine position with the left arm extended on an arm board.  Following adequate IV sedation, the left arm was prepped and draped circumferentially from the axilla to the hand.  The longitudinal incision on the upper arm medially was anesthetized with 1% lidocaine and the longitudinal incision was then reopened.  Upon dissecting down through the subcutaneous tissues, a fluid-filled pocket surrounding the graft with mucoid purulent-appearing material was entered and approximately 20 cc of this material was  expressed.  The graft was not incorporated at all and was freely mobile within its tunnel tract.  At this point it was recognized that the patient had a graft infection and we planned to remove the entire graft.  At this point the original transverse incision below the elbow was anesthetized with 1% lidocaine.  A transverse incision through the existing scar was made. The venous limb again was identified again freely mobile in the tunnel tract and the arterial limb of the graft was also identified and found to be unincorporated as well.  The venous limb was transected at this level and the distal outflow of the venous limb of the graft was pulled back through its tunnel and brought out into the lower wound.  Next the arterial limb of the graft was dissected circumferentially.  The brachial artery at this level was dissected proximally and distally and controlled with vessel loops.  The distal transverse incision which was from the original procedure was anesthetized with 1% lidocaine and opened as well.  The PTFE graft was pulled from its tunnel position in the subcutaneous plane without difficulty.  The graft was transected at this level.  Next, the remaining portion of AV graft was pulled back through its tunnel tract and brought out in the main incision on the anterior forearm.  The patient was then systemically heparinized.  A segment of the basilic vein was harvested distal to where the distal venous anastomosis was.  Proximally, the basilic vein was ligated and divided.  The  distal most portion of the vein graft where it was anastomosed to the basilic vein was discarded.  The remaining segment of basilic vein was harvested and then distally ligated.  This vein segment was placed in heparin saline solution.  Following an adequate three-minute circulation time for the heparin, proximal clamps were placed on the brachial artery and distally on the brachial artery.  The anastomosis was  taken down with sharp dissection removing the Prolene suture as well.  The resulting arterial wall defect edges were freshened.  There was excellent inflow seen flashing the clamps proximally and distally.  Next, the vein segment was opened longitudinally and then trimmed to the appropriate length.  The vein patch was then sewn into position with running 6-0 Prolene suture.  Prior to completion of the anastomosis, the brachial artery was flushed and back-bled.  The anastomosis was then completed.  Clamps were released.  There was minimal bleeding from the anastomotic suture line and the patient was noted to have a palpable radial pulse.  The patient was then given 20 mg of protamine and then hemostasis was achieved in all wounds.  A 1/4 inch Penrose drain was then passed through each tunnel of the arteriovenous graft which had been removed. The ends of the Penrose drain were brought out through the medial and lateral margins of the transverse incision.  A second segment of 1/4 inch Penrose drain was placed in the longitudinal incision on the upper arm.  The longitudinal and transverse incisions were closed with interrupted 3-0 nylon suture.  Sterile dry dressings were applied.  The patient was then awakened from anesthesia and transferred to the recovery room in stable condition.  The patient tolerated the procedure well.  There were no complications.  All sponge and needle counts were reported as correct. Attending Physician:  Caralee Ates. DD:  11/17/00 TD:  11/18/00 Job: [id number]

## 2010-08-09 NOTE — Op Note (Signed)
[place]. [facility]  Patient:    MANCIL, PFENNING                       MRN: [id number] Proc. Date: 10/20/00 Attending:  Larina Earthly, M.D.                           Operative Report  PREOPERATIVE DIAGNOSIS:  End-stage renal disease with occluded forearm loop arteriovenous Gore-Tex graft.  POSTOPERATIVE DIAGNOSIS:  End-stage renal disease with occluded forearm loop arteriovenous Gore-Tex graft.  OPERATION PERFORMED:  Thrombectomy and interposition jump graft revision to higher basilic vein of left forearm loop arteriovenous Gore-Tex graft.  SURGEON:  Larina Earthly, M.D.  ASSISTANT:  Maxwell Marion, RNFA  ANESTHESIA:  MAC.  COMPLICATIONS:  None.  DISPOSITION:  To recovery room stable.  DESCRIPTION OF PROCEDURE:  The patient was taken to the operating room and placed in supine position where the area of the left arm was prepped and draped in the usual sterile fashion.  Using local anesthesia, the incision was made longitudinally over the prior venous anastomosis.  The graft was opened near the venous anastomosis.  There was tight stenosis in the brachial artery. The vein itself was small caliber.  The graft was thrombectomized.  The arterialized plug was removed and good inflow was obtained.  The graft was flushed with heparinized saline and re-occluded.  A separate incision was made higher over elbow position over the basilic vein and this was of good caliber. The vein was occluded proximally and distally, opened with an 11 blade and extended longitudinally with Potts scissors.  A new interposition graft with 7 mm Gore-Tex was brought onto the field.  This was spatulated and sewn end-to-side to the vein with a running  6-0 Prolene suture.  This was then tunneled to the level of the prior venous anastomosis.  The graft was cut to the appropriate length and was sewn end-to-end to the old graft with a running 6-0 Prolene suture.  Clamps were removed  and good thrill was noted.  The wound was irrigated with saline.  Hemostasis electrocautery.  Wound was closed with 3-0 Vicryl in the subcutaneous and subcuticular tissue.  Benzoin and Steri-Strips were applied. DD:  10/20/00 TD:  10/20/00 Job: [id number]

## 2010-08-09 NOTE — Discharge Summary (Signed)
NAME:  [name], [name] NO.:  [id number]   MEDICAL RECORD NO.:  [id number]          PATIENT TYPE:  INP   LOCATION:  6711                         FACILITY:  [facility]   PHYSICIAN:  Alvester Morin, M.D.  DATE OF BIRTH:  1951-02-15   DATE OF ADMISSION:  07/22/2008  DATE OF DISCHARGE:  07/24/2008                               DISCHARGE SUMMARY   CONTINUITY DOCTOR:  Dr. Sherryll Burger, Outpatient Clinic.   CONSULTANT:  Nephrologist.   DISCHARGE DIAGNOSIS:  Acute stroke.   OTHER DIAGNOSES:  1. End-stage renal disease on peritoneal dialysis for the last 7      years.  2. Hypertension.   DISCHARGE MEDICATIONS:  1. Norvasc 10 mg by mouth daily.  2. Aspirin 325 p.o. daily.  3. Sensipar 90 mg p.o. daily.  4. Zemplar 1 mcg p.o. daily.  5. Nephro-Vite 1 tablet p.o. daily.  6. Zocor 5 mg by mouth daily.  7. Xanax 1 mg by mouth daily as needed.  8. Metoprolol 100 mg by mouth daily.   DISPOSITION AND FOLLOWUP:  [name] will follow at the Outpatient  Clinic with Dr. Sherryll Burger.  During that appointment, 2-D echo results need to  be followed up.  Also counseling regarding  stroke prevention,  particularly smoking cessation will be addressed again.   PROCEDURE PERFORMED:  1. Carotid Doppler preliminary report, no evidence of occlusion.  2. CT head negative for acute hemorrhage, scattered low density in the      preventricular and subcortical white matter.  This is non specific      finding and may represent chronic small vessel ischemic changes.      Possible old insult involving the left cerebral hemisphere.   CONSULTATION:  Nephrology for peritoneal dialysis.   HISTORY OF PRESENT ILLNESS:  This is a 60 year old with past medical  history of end-stage renal disease on peritoneal dialysis over the last  7 years, hypertension, who presents complaining of slurred speech.  He  relates that he wakes up earlier like around 8:30 a.m. with difficulty  speaking.  He denies any weakness,  numbness, tingling, fever, chills,  headache, and chest pain.  He denies prior episode.  He was not taking  aspirin.  He relates he had his dialysis the night prior to admission.  The patient is a current smoker of 1 pack per day for 25 years.  Denies  alcohol or recreational drugs.   PHYSICAL EXAMINATION:  VITAL SIGNS:  Temperature 98.0, blood pressure  139/74, pulse 54, respiration 20, and saturation 99% on 2 L.  GENERAL:  The patient is in no acute distress with slurred speech.  EYES:  Pupils equal and reactive to light.  Extraocular muscles intact,  anicteric.  ENT:  No tonsillar enlargement. Uvula central.  NECK:  No carotid bruits.  No thyromegaly.  RESPIRATION:  Bilateral rhonchi.  No wheezing, no crackles.  Good air  movement.  CARDIOVASCULAR:  S1 and S2.  Normal regular rhythm and rate.  No murmur.  No gallop.  GASTROINTESTINAL:  Bowel sounds are positive, soft, nondistended.  No  rigidity.  No hepatomegaly.  EXTREMITIES:  Pulse positive, no edema.  SKIN:  No rashes, no lymphadenopathy.  NEURO:  He was alert and oriented.  Left facial droop.  Cranial nerves  II through XII intact except as prior. Motor strength 5/5 on the right  upper extremity and bilateral lower extremity, 4/5 on the left upper  extremity.  Sensation grossly intact.  Cerebellar intact.  PSYCH:  Appropriate.   LABORATORY DATA:  Sodium 139, potassium 3.3, chloride 96, bicarb 29, BUN  44, creatinine 14, and glucose 129.  CBC, white blood cells 6.1,  hemoglobin 12.5, hematocrit  37.6, and platelets 282.  ANC 4.0, MCV  88.6, calcium 7.9, PTT 33, and INR 1.1.   HOSPITAL COURSE:  1. Acute stroke.  [name] was admitted to telemetry.  He was      started on daily ASA. Speech therapy was consulted and swallow      evaluation was performed and was normal.  Carotid Doppler      preliminary report was negative.  We ordered 2-D echo, the result      is pending.  Risk stratification was performed.  Lipid  profile,      hemoglobin A1c was obtained.  HDL was 33, LDL 100, TSH 1.2,      hemoglobin A1c 4.3.  The patient's neurologic deficit, slurred      speech, and left arm weakness totally resolved.  Smoking counseling      was provided.  Blood pressure was well controlled.  We avoid to      decrease his blood pressure during the first 24 hours of stroke      presentation.  The day after admission, we restarted his Norvasc      and metoprolol.  2. End-stage renal disease on peritoneal dialysis.  We consult renal      for peritoneal dialysis, hypokalemia will be repleted via doing      dialysis.  3. Hypertension.  His blood pressure on admission was 139/74.  We held      his blood pressure medication during the first 24 hour of admission      to avoid drop in the setting of an acute stroke.  We then restarted      his blood pressure medication.  4. Smoking.  Smoking counseling was provided.  Nicotine patch as      needed.   The patient was discharged in good condition.   LABORATORIES ON THE DAY OF DISCHARGE:  Sodium 137, potassium 3.6,  chloride 98, bicarb 29, glucose 121, BUN 47, and creatinine 15.      Hartley Barefoot, MD  Electronically Signed      Alvester Morin, M.D.  Electronically Signed    BR/MEDQ  D:  07/26/2008  T:  07/26/2008  Job:  [id number]

## 2010-08-09 NOTE — Op Note (Signed)
[place]. [facility]  Patient:    [name], [name] Visit Number: [id number] MRN: [id number]          Service Type: DSU Location: RCRM [id number] Attending Physician:  Caralee Ates Dictated by:   Caralee Ates, M.D. Proc. Date: 12/10/00 Admit Date:  12/10/2000                             Operative Report  PREOPERATIVE DIAGNOSIS:  End-stage renal disease.  POSTOPERATIVE DIAGNOSIS:  End-stage renal disease.  OPERATION PERFORMED:  Right brachiocephalic arteriovenous fistula.  SURGEON:  Caralee Ates, M.D.  ANESTHESIA:  MAC plus 1% lidocaine as local.  ESTIMATED BLOOD LOSS:  20 cc.  DRAINS:  None.  SPECIMENS:  None.  COMPLICATIONS:  None.  INDICATIONS FOR PROCEDURE:  The patient is a 60 year old black male with end-stage renal disease on hemodialysis.  He recently had a left brachiocephalic arteriovenous fistula placed that ultimately failed.  He currently is dialyzed through an Ash catheter. Today he is scheduled for elective brachiocephalic fistula on the right.  The risks, benefits and alternatives to the procedure were explained in detail to the patient and he agreed to proceed.  DESCRIPTION OF PROCEDURE:  The patient was brought to the operating room and placed on the operating table in supine position with the right arm extended on an arm board.  Following adequate intravenous sedation, the right arm was prepped and draped circumferentially from axilla to the hand.  An area of the anterior forearm just above the elbow was anesthetized with 1% lidocaine.  A small transverse incision was made.  The cephalic vein was identified in the base of the wound, dissected circumferentially and controlled with a vessel loop.  The vein was of adequate quality and of large caliber.  Next, the brachial artery was exposed medially, mobilized proximally and distally and encircled with vessel loops.  Next, the patient was systemically heparinized. Following  three-minute circulation time, the one large side branch of the cephalic vein was ligated with 3-0 tie and distally, the cephalic vein was ligated and divided with a Seraphim placed proximally to prevent back-bleeding. Next the vessel loops around the brachial artery were tightened to occlude flow.  The cephalic vein was then spatulated in preparation for anastomosis.  A longitudinal arteriotomy was performed on the brachial artery and end-to-side anastomosis was created with a running 7-0 Prolene suture. Prior to completion of the anastomosis the native vessels were flushed and vein was back-bled.  The anastomosis was then completed and the vessel loops were released followed by release of the Seraphim clamp and there was excellent flow in the brachial artery with a palpable radial pulse and excellent thrill in the graft.  As the patients blood pressure drifted down into the 70s, the thrill was somewhat diminished, however, still present. Apparently he has history of labile blood pressure.  Following this, hemostasis was achieved and the wound was closed in layers with 3 and 4-0 Vicryl suture.  Sterile dry dressings were applied.  The patient was then awakened from anesthesia and transferred to the recovery room in stable condition.  The patient tolerated the procedure well.  There were no complications.  All sponge and needle counts were reported as correct. Dictated by:   Caralee Ates, M.D. Attending Physician:  Caralee Ates. DD:  12/10/00 TD:  12/10/00 Job: [id number]

## 2010-08-12 NOTE — Consult Note (Signed)
NAME:  [name], [name] NO.:  [id number]  MEDICAL RECORD NO.:  [id number]           PATIENT TYPE:  LOCATION:                                 FACILITY:  PHYSICIAN:  Jorja Loa, M.D.DATE OF BIRTH:  1951/03/11  DATE OF CONSULTATION: DATE OF DISCHARGE:                                CONSULTATION   REASON FOR CONSULTATION:  CHF and end-stage renal disease for hemodialysis.  [name] is a patient who is 60 years old with end-stage renal disease on maintenance hemodialysis on Monday, Wednesday, Friday, his last dialysis was on Monday, presently came to the emergency room with complaints of shortness of breath, orthopnea, paroxysmal nocturnal dyspnea starting this a.m.  When the patient was evaluated, he was found to be in CHF, hence admitted for further dialysis.  According to the patient, he went to dialysis on Monday and he was dialyzed, however, after he went home, he was feeling thirsty.  He was drinking some fluid. According to the patient, he has also this chronic cough for more than 2 months and he has discussed with Dr. Anne Hahn.  According to the patient, he did not give him any treatment.  He said it is a hacking cough, is producing whitish sputum.  He denies any fevers, chills, or sweating.  PAST MEDICAL HISTORY: 1. He has history of end-stage renal disease, on maintenance     hemodialysis on Monday, Wednesday, and Friday, last dialysis was on     Monday. 2. History of hypertension. 3. History of CVA. 4. History of anemia with a GI bleeding. 5. History of GERD. 6. History of hyperphosphatemia. 7. History of CHF. 8. History of failed peritoneal dialysis. 9. History of coronary artery disease. 10.History of peripheral vascular disease.  MEDICATIONS: 1. Ventolin inhaler 5 mg every 6 hours. 2. He is also on aspirin 81 mg p.o. daily. 3. Vasotec 0.625 mg IV. 4. Lovenox 40 mg subcu. 5. Atrovent inhaler. 6. He is on NicoDerm every 24 hours. 7.  He is getting some Tylenol on p.r.n. basis. 8. Other medications are p.r.n. basis.  ALLERGIES:  No known allergy.  PAST SURGICAL HISTORY:  He is status post hernia repair and also stated above failed peritoneal dialysis secondary to infection and also recurrent loss of an access.  Recently, he had an episode where they were trying to create a fistula and all his blood vessels they tried were clotted, hence the attempt was stopped.  SOCIAL HISTORY:  He has history of smoking.  He has also history of alcohol abuse.  Recently, he said he is not drinking.  No history of drug use.  REVIEW OF SYSTEMS:  His main complaint seems to be shortness of breath, orthopnea since 4:00 a.m., and he also has this cough for some time which is with whitish sputum production.  No fevers, chills, or sweating.  He denies any chest pain.  He has a problem with constipation.  No history of diarrhea.  PHYSICAL EXAMINATION:  GENERAL:  The patient at this moment is on face mask O2. VITAL SIGNS:  His blood pressure is 180/70, pulse of 81. CHEST:  He  has bilateral inspiratory crackles and also some wheezing. HEART:  Regular rate and rhythm.  No murmur.  No S3. ABDOMEN:  Soft, positive bowel sounds. EXTREMITIES:  He has trace edema.  His blood gas pH is 7.27, pCO2 of 61.  His O2 saturation is 100, pO2 is 187.  White blood cell count is 5.7, hemoglobin 10.4, hematocrit is 34. His sodium is 143, potassium is 5, BUN is 49, creatinine is 8.9.  His BNP is more than 3500.  His troponin is less than 0.05.  He has a chest x-ray which basically shows congestive heart failure changes with small bilateral effusion.  ASSESSMENT: 1. Congestive heart failure, possibly noncompliant with fluid intake.     He said he has come only for dialysis and presently, he has     significant sign of fluid overload.  I am not sure whether they     have remove fluid on dialysis.  According to him, he did not see     Dr. Anne Hahn also for  sometime. 2. History of anemia secondary to gastrointestinal bleeding and also     secondary to chronic renal failure.  He is on Epogen, H and H     stable. 3. History of cough for sometime.  Etiology at this moment is not     clear. 4. History of pleural effusion. 5. History of hypertension.  His blood pressure was very high and     uncontrolled. 6. History of gastroesophageal reflux disease. 7. History of hyperphosphatemia. 8. History of degenerative joint disease. 9. History of cerebrovascular accident. 10.History of failed peritoneal dialysis. 11.History of respiratory acidosis. 12.The patient has history of smoking, possibly also might have     underlying chronic obstructive pulmonary disease.  RECOMMENDATIONS:  We will make arrangement for the patient to get dialysis today.  Possibly, we will try to get about 3.5 liters, and we will continue his other medication.  We will use 2k 2.5 calcium bath. We will also give him Epogen.  The patient is advised to cut down his fluid and salt intake.     Jorja Loa, M.D.     BB/MEDQ  D:  07/31/2010  T:  07/31/2010  Job:  [id number]  Electronically Signed by Jorja Loa M.D. on 08/12/2010 01:41:53 PM

## 2010-08-27 ENCOUNTER — Ambulatory Visit (INDEPENDENT_AMBULATORY_CARE_PROVIDER_SITE_OTHER): Payer: Medicare Other | Admitting: Vascular Surgery

## 2010-08-27 ENCOUNTER — Encounter (INDEPENDENT_AMBULATORY_CARE_PROVIDER_SITE_OTHER): Payer: Medicare Other

## 2010-08-27 DIAGNOSIS — Z01810 Encounter for preprocedural cardiovascular examination: Secondary | ICD-10-CM

## 2010-08-27 DIAGNOSIS — N186 End stage renal disease: Secondary | ICD-10-CM

## 2010-08-28 NOTE — H&P (Signed)
HISTORY AND PHYSICAL EXAMINATION  August 27, 2010  Re:  [name], [name]              DOB:  08/28/1950  NEW PATIENT CONSULTATION HISTORY AND PHYSICAL  CHIEF COMPLAINT:  Needs vascular access  HISTORY OF PRESENT ILLNESS:  This 60 year old male patient has been on hemodialysis since 1992.  He most recently had a right upper arm brachiocephalic AV fistula which has not worked in the past 3 years and had a period of peritoneal dialysis but now back on hemodialysis through a left IJ Diatek catheter.  He remotely had an infected left forearm graft removed, but has never had other access for the left upper extremity.  CHRONIC MEDICAL PROBLEMS: 1. Hypertension. 2. Hyperlipidemia. 3. End-stage renal disease dialyzed Monday, Wednesday, Friday. 4. History of ethanol abuse, stopped in 1992. 5. Negative for coronary artery disease, diabetes, COPD, or stroke.  SOCIAL HISTORY:  He is married and has 3 children.  He does not use tobacco or alcohol.  FAMILY HISTORY:  Negative for coronary artery disease, diabetes, or stroke.  REVIEW OF SYSTEMS:  He does have lower extremity claudication in both legs limiting him to walking less than 1 block.  He denies any rest pain, nonhealing ulcers, chest pain, dyspnea on exertion.  All other systems are negative on complete review of systems.  PHYSICAL EXAM:  Vital Signs:  Blood pressure 164/73, heart rate 96, respirations 20.  General:  He is a well-developed, well-nourished thin male in no apparent distress, alert and oriented x3.  HEENT:  Exam is normal for age.  EOMs intact.  Lungs:  Clear to auscultation.  No rhonchi or wheezing.  Cardiovascular:  Regular rhythm.  No murmurs. Carotid pulses are 3+, no audible bruits.  Abdomen:  Soft, nontender with no masses.  Musculoskeletal:  Free of major deformities. Neurologic:  Normal.  Skin:  Free of rashes.  Extremities:  Upper extremity exam on the right reveals 3+ brachial and radial  pulse with a thrombosed upper arm fistula, 2 pseudoaneurysms up to 2.5 to 3 cm in diameter which are thrombosed.  Left upper extremity has 3+ brachial, 2+ radial pulse with evidence of a previous incision in the left antecubital area where the brachial artery was repaired with vein patch after removal of infected graft many years ago.  He does have what appears to be an adequate cephalic vein in the upper arm.  Today, I ordered bilateral vein mapping which reveals the left upper arm cephalic vein to be of good caliber.  The left basilic vein system is occluded. The right basilic vein system is also borderline to possibly adequate caliber.  I think the best plan would to be to attempt a left upper arm brachiocephalic fistula which I have scheduled with Dr. Imogene Burn on Tuesday, June 12th.  The risks and benefits have been thoroughly discussed and the patient would like to proceed.    Quita Skye Hart Rochester, M.D. Electronically Signed  JDL/MEDQ  D:  08/27/2010  T:  08/28/2010  Job:  [id number]

## 2010-09-03 NOTE — Procedures (Unsigned)
CEPHALIC VEIN MAPPING  INDICATION:  Preoperative vein mapping.  HISTORY:  EXAM: The right cephalic vein has been used previously for an AV fistula site with the upper arm outflow vein demonstrating dampened flow and near- occlusion.  The right mid to distal forearm level cephalic vein is compressible with diameter measurements ranging from 0.24 to 0.26 cm.  The right basilic vein is compressible with diameter measurements ranging from 0.19 to 0.32 cm.  The left cephalic vein is compressible with diameter measurements ranging from 0.26 to 0.45 cm.  The left basilic vein appears to be totally occluded in the upper arm level.  See attached worksheet for all measurements.  IMPRESSION: 1. Patent right basilic and left cephalic veins noted as described     above. 2. Flow obstruction is noted in the right cephalic and left basilic     veins as described above and on the attached worksheet.  ___________________________________________ Quita Skye. Hart Rochester, M.D.  CH/MEDQ  D:  08/28/2010  T:  08/28/2010  Job:  [id number]

## 2010-09-10 ENCOUNTER — Ambulatory Visit (HOSPITAL_COMMUNITY): Payer: Medicare Other

## 2010-09-10 ENCOUNTER — Ambulatory Visit (HOSPITAL_COMMUNITY)
Admission: RE | Admit: 2010-09-10 | Discharge: 2010-09-10 | Disposition: A | Discharge status: Home / Self Care | Payer: Medicare Other | Source: Ambulatory Visit | Attending: Vascular Surgery | Admitting: Vascular Surgery

## 2010-09-10 DIAGNOSIS — Z8673 Personal history of transient ischemic attack (TIA), and cerebral infarction without residual deficits: Secondary | ICD-10-CM | POA: Insufficient documentation

## 2010-09-10 DIAGNOSIS — I120 Hypertensive chronic kidney disease with stage 5 chronic kidney disease or end stage renal disease: Secondary | ICD-10-CM | POA: Insufficient documentation

## 2010-09-10 DIAGNOSIS — N186 End stage renal disease: Secondary | ICD-10-CM | POA: Insufficient documentation

## 2010-09-10 DIAGNOSIS — Z01812 Encounter for preprocedural laboratory examination: Secondary | ICD-10-CM | POA: Insufficient documentation

## 2010-09-10 DIAGNOSIS — Z01818 Encounter for other preprocedural examination: Secondary | ICD-10-CM | POA: Insufficient documentation

## 2010-09-10 LAB — POCT I-STAT 4, (NA,K, GLUC, HGB,HCT)
Glucose, Bld: 80 mg/dL (ref 70–99)
Potassium: 4.3 mEq/L (ref 3.5–5.1)
Sodium: 138 mEq/L (ref 135–145)

## 2010-09-10 LAB — SURGICAL PCR SCREEN: MRSA, PCR: NEGATIVE

## 2010-09-11 NOTE — Op Note (Signed)
NAME:  [name], [name] NO.:  [id number]  MEDICAL RECORD NO.:  [id number]  LOCATION:  [facility]                         FACILITY:  [facility]  PHYSICIAN:  Fransisco Hertz, MD       DATE OF BIRTH:  [birth year]  DATE OF PROCEDURE:  09/10/2010 DATE OF DISCHARGE:  09/10/2010                              OPERATIVE REPORT   PROCEDURE:  Left brachiocephalic arteriovenous fistula.  PREOPERATIVE DIAGNOSIS:  End-stage renal disease requiring hemodialysis.  POSTOPERATIVE DIAGNOSIS:  End-stage renal disease requiring hemodialysis.  SURGEON:  Fransisco Hertz, MD  ASSISTANT:  Della Goo, PA-C  ANESTHESIA:  General.  FINDINGS:  A palpable thrill and a left radial signal at the end of the case.  SPECIMENS:  None.  ESTIMATED BLOOD LOSS:  Minimal.  INDICATIONS:  This is a 60 year old gentleman with a previous left forearm arteriovenous graft that had to be removed, then had patch angioplasty of brachial artery.  He was being evaluated by Dr. Hart Rochester for access options and based on the vein mapping it was felt his next option would be a left brachiocephalic arteriovenous fistula.  The patient is aware of the risks of this procedure include but are not limited to bleeding, infection, possible steal syndrome, possible ischemic monomelic neuropathy, possible nerve damage, possible failure to mature and possible need for additional procedure.  He is aware of these risks and agreed to proceed forward with this operation.  DESCRIPTION OF THE OPERATION:  After full informed written consent was obtained from the patient, he was brought back to the operating room and placed supine upon the operating table.  He was given IV antibiotics prior to induction.  After obtaining adequate anesthesia, he was prepped and draped in standard fashion for a left arm access procedure.  At this point, I turned my attention to his antecubitum.  Using a SonoSite I identified the brachial artery and the  cephalic vein.  There appeared to be also a nice sized cubital vein that drained into the cephalic vein further up in the arm.  Based on external exam at the antecubitum, this cubital vein was larger than the cephalic vein.  Hence the plan was to use a cubital vein as the outflow for this anastomosis.  At this point, I made a longitudinal incision between the position of the brachial artery and this cubital vein.  Using blunt dissection and electrocautery, I dissected through the subcutaneous tissue and also the fascia.  I dissected out the brachial artery proximally and distally and controlled it with vessel loops.  The brachial artery appeared to be about 4-5 mm in diameter externally.  At this point, I dissected the subcutaneous tissue to the cubital vein.  It was noted to be at least 3- 4 mm externally.  There was evidence of some sclerotic tissue around this vein.  I dissected the vein onto the forearm slightly and then clamped the forearm portion of the cubital vein.  Then transected the vein and then tied off the distal component with a suture ligature of 3- 0 Vicryl.  I then tested the more proximal aspect of this vein with serial dilators.  A 4-mm dilator passed through this vein  without any resistance easily.  At this point, I reset my exposure to visualize the brachial vein.  I placed the vein under tension proximally and distally with the vessel loops.  I then made arteriotomy with 11 blade and extended it with Potts scissor for about a 4-mm arteriotomy.  I then sewed the vein to the artery with a running stitch of 7-0 Prolene in an end-to-side configuration.  Prior to completing this anastomosis, I allowed all vessels to back bleed.  There was good backbleeding with no clots noted.  At this point, the anastomosis was completed in the usual fashion.  I then released all vessel loops and clamped.  Immediately, the venous outflow pressurized in the upper arm.  There was a  thrill that was palpable.  There was one bleeding side branch that was visualized.  This required repair with a single 7-0 Prolene stitch.  At this point, I irrigated out the arm and there was no more active bleeding noted.  At this point, I checked distally for a pulse.  There was no radial pulse but there was a dopplerable signal that did not change with compression of venous outflow.  The brachial artery proximal and distal to the anastomosis had multiphasic waveforms.  The venous outflow signature was consistent with a widely patent arteriovenous fistula.  Once again, there remained a strong thrill in the upper arm. At this point, the subcutaneous fistula in the antecubital incision was repaired with a running stitch of 3-0 Vicryl.  Skin was then repaired with a running subcuticular 4-0 Monocryl.  Skin was then reapproximated with Dermabond.  At this point, the patient was allowed to awaken without any problems and the plan being to discharge home.  COMPLICATIONS:  None.  CONDITION:  Stable.     Fransisco Hertz, MD     BLC/MEDQ  D:  09/10/2010  T:  09/10/2010  Job:  [id number]  Electronically Signed by Leonides Sake MD on 09/11/2010 09:20:39 AM

## 2010-10-09 ENCOUNTER — Ambulatory Visit: Payer: Medicare Other

## 2010-10-24 ENCOUNTER — Ambulatory Visit: Payer: Medicare Other

## 2010-11-04 ENCOUNTER — Encounter: Payer: Self-pay | Admitting: Physician Assistant

## 2010-11-07 ENCOUNTER — Ambulatory Visit (INDEPENDENT_AMBULATORY_CARE_PROVIDER_SITE_OTHER): Payer: Medicare Other | Admitting: Physician Assistant

## 2010-11-07 VITALS — BP 154/42 | HR 83 | Resp 16

## 2010-11-07 DIAGNOSIS — N186 End stage renal disease: Secondary | ICD-10-CM

## 2010-11-07 NOTE — Progress Notes (Signed)
Subjective:     Patient ID: [name], male   DOB: 28-May-1950, 60 y.o.   MRN: [id number]  HPI 60 Y/O BM who returns today after his Left brachiocephalic AVF on 09/10/10.  He has no complaints of steal symptoms as he denies numbness and has no motor or sensory deficit.  Review of Systems  See HPI    Objective:   Physical Exam Filed Vitals:   11/07/10 1427  BP: 154/42  Pulse: 83  Resp: 16   He has a 2+ palpable radial pulse on the left.  His incision is well healed and he has a good thrill in his fistula.    Assessment:     60 y/o BM who is s/p Left brachiocephalic AVF and it is developing nicely.    Plan:     May use AVF 12/11/10.  He will follow up with [place] on a PRN basis.     Attending MD is Dr. Darrick Penna

## 2010-12-20 ENCOUNTER — Other Ambulatory Visit (HOSPITAL_COMMUNITY): Payer: Self-pay | Admitting: Nephrology

## 2010-12-20 DIAGNOSIS — N186 End stage renal disease: Secondary | ICD-10-CM

## 2010-12-31 ENCOUNTER — Other Ambulatory Visit (HOSPITAL_COMMUNITY): Payer: Self-pay | Admitting: Nephrology

## 2010-12-31 ENCOUNTER — Ambulatory Visit (HOSPITAL_COMMUNITY)
Admission: RE | Admit: 2010-12-31 | Discharge: 2010-12-31 | Disposition: A | Discharge status: Home / Self Care | Payer: Medicare Other | Source: Ambulatory Visit | Attending: Nephrology | Admitting: Nephrology

## 2010-12-31 ENCOUNTER — Other Ambulatory Visit (HOSPITAL_COMMUNITY): Payer: Self-pay | Admitting: Interventional Radiology

## 2010-12-31 DIAGNOSIS — N186 End stage renal disease: Secondary | ICD-10-CM

## 2010-12-31 DIAGNOSIS — T82898A Other specified complication of vascular prosthetic devices, implants and grafts, initial encounter: Secondary | ICD-10-CM | POA: Insufficient documentation

## 2010-12-31 DIAGNOSIS — Y831 Surgical operation with implant of artificial internal device as the cause of abnormal reaction of the patient, or of later complication, without mention of misadventure at the time of the procedure: Secondary | ICD-10-CM | POA: Insufficient documentation

## 2010-12-31 MED ORDER — IOHEXOL 300 MG/ML  SOLN
100.0000 mL | Freq: Once | INTRAMUSCULAR | Status: AC | PRN
  Administered 2010-12-31: 65 mL via INTRAVENOUS

## 2011-01-07 ENCOUNTER — Ambulatory Visit (HOSPITAL_COMMUNITY): Payer: Medicare Other | Attending: Interventional Radiology

## 2011-01-13 ENCOUNTER — Other Ambulatory Visit (HOSPITAL_COMMUNITY): Payer: Self-pay | Admitting: Nephrology

## 2011-01-13 DIAGNOSIS — N186 End stage renal disease: Secondary | ICD-10-CM

## 2011-01-16 ENCOUNTER — Other Ambulatory Visit (HOSPITAL_COMMUNITY): Payer: Medicare Other

## 2011-01-17 ENCOUNTER — Other Ambulatory Visit (HOSPITAL_COMMUNITY): Payer: Self-pay | Admitting: Nephrology

## 2011-01-17 DIAGNOSIS — N186 End stage renal disease: Secondary | ICD-10-CM

## 2011-01-18 ENCOUNTER — Ambulatory Visit (HOSPITAL_COMMUNITY)
Admission: RE | Admit: 2011-01-18 | Discharge: 2011-01-18 | Disposition: A | Discharge status: Home / Self Care | Payer: Medicare Other | Source: Ambulatory Visit | Attending: Nephrology | Admitting: Nephrology

## 2011-01-18 ENCOUNTER — Other Ambulatory Visit (HOSPITAL_COMMUNITY): Payer: Self-pay | Admitting: Nephrology

## 2011-01-18 DIAGNOSIS — T82898A Other specified complication of vascular prosthetic devices, implants and grafts, initial encounter: Secondary | ICD-10-CM | POA: Insufficient documentation

## 2011-01-18 DIAGNOSIS — Y849 Medical procedure, unspecified as the cause of abnormal reaction of the patient, or of later complication, without mention of misadventure at the time of the procedure: Secondary | ICD-10-CM | POA: Insufficient documentation

## 2011-01-18 DIAGNOSIS — N186 End stage renal disease: Secondary | ICD-10-CM

## 2011-01-18 DIAGNOSIS — I120 Hypertensive chronic kidney disease with stage 5 chronic kidney disease or end stage renal disease: Secondary | ICD-10-CM | POA: Insufficient documentation

## 2011-01-18 DIAGNOSIS — Z992 Dependence on renal dialysis: Secondary | ICD-10-CM | POA: Insufficient documentation

## 2011-01-18 MED ORDER — IOHEXOL 300 MG/ML  SOLN
100.0000 mL | Freq: Once | INTRAMUSCULAR | Status: AC | PRN
  Administered 2011-01-18: 50 mL via INTRAVENOUS

## 2011-01-22 ENCOUNTER — Encounter (HOSPITAL_COMMUNITY): Payer: Self-pay

## 2011-01-23 ENCOUNTER — Ambulatory Visit (HOSPITAL_COMMUNITY): Admission: RE | Admit: 2011-01-23 | Payer: Medicare Other | Source: Ambulatory Visit

## 2011-01-24 ENCOUNTER — Other Ambulatory Visit (HOSPITAL_COMMUNITY): Payer: Self-pay | Admitting: Nephrology

## 2011-01-24 DIAGNOSIS — N186 End stage renal disease: Secondary | ICD-10-CM

## 2011-02-06 ENCOUNTER — Ambulatory Visit (HOSPITAL_COMMUNITY)
Admission: RE | Admit: 2011-02-06 | Discharge: 2011-02-06 | Disposition: A | Discharge status: Home / Self Care | Payer: Medicare Other | Source: Ambulatory Visit | Attending: Nephrology | Admitting: Nephrology

## 2011-02-06 DIAGNOSIS — N186 End stage renal disease: Secondary | ICD-10-CM

## 2011-02-06 DIAGNOSIS — Z452 Encounter for adjustment and management of vascular access device: Secondary | ICD-10-CM | POA: Insufficient documentation

## 2011-02-06 MED ORDER — CHLORHEXIDINE GLUCONATE 4 % EX LIQD
CUTANEOUS | Status: AC
  Filled 2011-02-06: qty 30

## 2011-04-06 ENCOUNTER — Encounter (HOSPITAL_COMMUNITY): Payer: Self-pay | Admitting: *Deleted

## 2011-04-06 ENCOUNTER — Emergency Department (HOSPITAL_COMMUNITY)
Admission: EM | Admit: 2011-04-06 | Discharge: 2011-04-06 | Disposition: A | Discharge status: Home / Self Care | Payer: Medicare Other | Attending: Emergency Medicine | Admitting: Emergency Medicine

## 2011-04-06 DIAGNOSIS — M259 Joint disorder, unspecified: Secondary | ICD-10-CM | POA: Insufficient documentation

## 2011-04-06 DIAGNOSIS — M25419 Effusion, unspecified shoulder: Secondary | ICD-10-CM | POA: Insufficient documentation

## 2011-04-06 DIAGNOSIS — M129 Arthropathy, unspecified: Secondary | ICD-10-CM | POA: Insufficient documentation

## 2011-04-06 DIAGNOSIS — M25559 Pain in unspecified hip: Secondary | ICD-10-CM | POA: Insufficient documentation

## 2011-04-06 DIAGNOSIS — M25569 Pain in unspecified knee: Secondary | ICD-10-CM | POA: Insufficient documentation

## 2011-04-06 DIAGNOSIS — E785 Hyperlipidemia, unspecified: Secondary | ICD-10-CM | POA: Insufficient documentation

## 2011-04-06 DIAGNOSIS — I129 Hypertensive chronic kidney disease with stage 1 through stage 4 chronic kidney disease, or unspecified chronic kidney disease: Secondary | ICD-10-CM | POA: Insufficient documentation

## 2011-04-06 DIAGNOSIS — Z992 Dependence on renal dialysis: Secondary | ICD-10-CM | POA: Insufficient documentation

## 2011-04-06 DIAGNOSIS — M25519 Pain in unspecified shoulder: Secondary | ICD-10-CM | POA: Insufficient documentation

## 2011-04-06 DIAGNOSIS — M1990 Unspecified osteoarthritis, unspecified site: Secondary | ICD-10-CM

## 2011-04-06 DIAGNOSIS — IMO0001 Reserved for inherently not codable concepts without codable children: Secondary | ICD-10-CM | POA: Insufficient documentation

## 2011-04-06 DIAGNOSIS — M549 Dorsalgia, unspecified: Secondary | ICD-10-CM | POA: Insufficient documentation

## 2011-04-06 DIAGNOSIS — N189 Chronic kidney disease, unspecified: Secondary | ICD-10-CM | POA: Insufficient documentation

## 2011-04-06 DIAGNOSIS — M79609 Pain in unspecified limb: Secondary | ICD-10-CM | POA: Insufficient documentation

## 2011-04-06 HISTORY — DX: Dependence on renal dialysis: Z99.2

## 2011-04-06 MED ORDER — HYDROCODONE-ACETAMINOPHEN 5-325 MG PO TABS
2.0000 | ORAL_TABLET | Freq: Once | ORAL | Status: AC
  Administered 2011-04-06: 2 via ORAL
  Filled 2011-04-06: qty 2

## 2011-04-06 MED ORDER — DEXAMETHASONE SODIUM PHOSPHATE 4 MG/ML IJ SOLN
8.0000 mg | Freq: Once | INTRAMUSCULAR | Status: AC
  Administered 2011-04-06: 8 mg via INTRAMUSCULAR
  Filled 2011-04-06: qty 2

## 2011-04-06 MED ORDER — DEXAMETHASONE 6 MG PO TABS: ORAL_TABLET | ORAL | Status: AC

## 2011-04-06 MED ORDER — ONDANSETRON HCL 4 MG PO TABS
4.0000 mg | ORAL_TABLET | Freq: Once | ORAL | Status: AC
  Administered 2011-04-06: 4 mg via ORAL
  Filled 2011-04-06: qty 1

## 2011-04-06 MED ORDER — HYDROCODONE-ACETAMINOPHEN 5-325 MG PO TABS: 1.0000 | ORAL_TABLET | ORAL | Status: AC | PRN

## 2011-04-06 NOTE — ED Notes (Signed)
Pt states was next door seeing family in the ED. Pt also states was having an anxiety attack and needed to walk out side. Pt finally in room.

## 2011-04-06 NOTE — ED Provider Notes (Signed)
Medical screening examination/treatment/procedure(s) were performed by non-physician practitioner and as supervising physician I was immediately available for consultation/collaboration.   Glynn Octave, MD 04/06/11 [phone]

## 2011-04-06 NOTE — ED Provider Notes (Signed)
History     CSN: [id number]  Arrival date & time 04/06/11  1542   First MD Initiated Contact with Patient 04/06/11 1704      Chief Complaint  Patient presents with  . Shoulder Pain  . Leg Pain    (Consider location/radiation/quality/duration/timing/severity/associated sxs/prior treatment) HPI Comments: Patient states he has had problems with pain in his legs and in particular his left shoulder for" years" he states that in the last few days the pain has gotten progressively worse to where he has problems getting in and out of the bed. He states he was told sometime back that he had" bone rubbing on bone" in several joints. He has been diagnosed with advanced arthritis. He is scheduled to see a pain specialist, but has not seen this person yet. He requests assistance with his pain.  The history is provided by the patient.    Past Medical History  Diagnosis Date  . Hypertension   . Hyperlipidemia   . Chronic kidney disease   . Arthritis   . Leg pain 10/29/2007    with walking  . Dialysis patient     Past Surgical History  Procedure Date  . Arteriovenous graft placement   . Av fistula placement 12/10/2000    Right brachiocephalic arteriovenous   . Hernia repair     Family History  Problem Relation Age of Onset  . Cancer Mother   . Cancer Father     History  Substance Use Topics  . Smoking status: Current Everyday Smoker -- 1.0 packs/day    Types: Cigarettes  . Smokeless tobacco: Not on file  . Alcohol Use: No      Review of Systems  Constitutional: Negative for activity change.       All ROS Neg except as noted in HPI  HENT: Negative for nosebleeds and neck pain.   Eyes: Negative for photophobia and discharge.  Respiratory: Negative for cough, shortness of breath and wheezing.   Cardiovascular: Negative for chest pain and palpitations.  Gastrointestinal: Negative for abdominal pain and blood in stool.  Genitourinary: Negative for dysuria, frequency and  hematuria.  Musculoskeletal: Positive for myalgias, back pain, joint swelling and arthralgias.  Skin: Negative.   Neurological: Negative for dizziness, seizures and speech difficulty.  Psychiatric/Behavioral: Negative for hallucinations and confusion. The patient is nervous/anxious.     Allergies  Review of patient's allergies indicates no known allergies.  Home Medications  No current outpatient prescriptions on file.  BP 115/65  Pulse 80  Temp(Src) 98.3 F (36.8 C) (Oral)  Resp 20  Ht 5\' 6"  (1.676 m)  Wt 157 lb (71.215 kg)  BMI 25.34 kg/m2  SpO2 96%  Physical Exam  Nursing note and vitals reviewed. Constitutional: He is oriented to person, place, and time. He appears well-developed and well-nourished.  Non-toxic appearance.  HENT:  Head: Normocephalic.  Right Ear: Tympanic membrane and external ear normal.  Left Ear: Tympanic membrane and external ear normal.  Eyes: EOM and lids are normal. Pupils are equal, round, and reactive to light.  Neck: Normal range of motion. Neck supple. Carotid bruit is not present.  Cardiovascular: Normal rate, regular rhythm, normal heart sounds, intact distal pulses and normal pulses.   Pulmonary/Chest: Breath sounds normal. No respiratory distress.  Abdominal: Soft. Bowel sounds are normal. There is no tenderness. There is no guarding.  Musculoskeletal: Normal range of motion.       There is crepitus with attempted range of motion of the left shoulder. There is  no dislocation or deformity. The joint is not hot. There is good range of motion of the right shoulder without pain. There is pain with attempted range of motion of both right and left hips and right and left knees. There is no effusion present. Dialysis graft in the right arm has good thrill.  Lymphadenopathy:       Head (right side): No submandibular adenopathy present.       Head (left side): No submandibular adenopathy present.    He has no cervical adenopathy.  Neurological: He is  alert and oriented to person, place, and time. He has normal strength. No cranial nerve deficit or sensory deficit.  Skin: Skin is warm and dry.  Psychiatric: His speech is normal. His mood appears anxious.    ED Course  Procedures (including critical care time) Pulse oximetry 96% on room air. Within normal limits by my interpretation. Labs Reviewed - No data to display No results found.   No diagnosis found.    MDM  I have reviewed nursing notes, vital signs, and all appropriate lab and imaging results for this patient. On 2 different occasions over a 40 minute period I went to visit the patient's wrong and he was not present. On the third visit to his room he was present. The patient states that he had a" panic attacks because he was sitting so long. He went to visit a family member who is also being seen on the acute side. He acknowledges that he did not let anyone know that he was leaving the room with the exception of his family member.  The patient has history of chronic joint pain he has been diagnosed with advanced arthritis. Prescription for Norco 5 mg Decadron 6 mg is given to the patient. Patient is scheduled to see a pain specialist by the end of the month.       Kathie Dike, [place] 04/06/11 1918

## 2011-04-06 NOTE — ED Notes (Signed)
Pt in room, asking when he will be seen by MD. Pt states has chronic pain. Has used OTC without success. Pt's c/o lt shoulder pain with decreased range of motion and rt hip pain.

## 2011-04-06 NOTE — ED Notes (Signed)
Pt c/o left shoulder pain and leg pain, pt states that he has been hurting "for years",

## 2011-06-17 ENCOUNTER — Emergency Department (HOSPITAL_COMMUNITY): Payer: Medicare Other

## 2011-06-17 ENCOUNTER — Encounter (HOSPITAL_COMMUNITY): Payer: Self-pay | Admitting: *Deleted

## 2011-06-17 ENCOUNTER — Emergency Department (HOSPITAL_COMMUNITY)
Admission: EM | Admit: 2011-06-17 | Discharge: 2011-06-17 | Disposition: A | Discharge status: Home / Self Care | Payer: Medicare Other | Attending: Emergency Medicine | Admitting: Emergency Medicine

## 2011-06-17 DIAGNOSIS — F17200 Nicotine dependence, unspecified, uncomplicated: Secondary | ICD-10-CM | POA: Insufficient documentation

## 2011-06-17 DIAGNOSIS — Z992 Dependence on renal dialysis: Secondary | ICD-10-CM | POA: Insufficient documentation

## 2011-06-17 DIAGNOSIS — E785 Hyperlipidemia, unspecified: Secondary | ICD-10-CM | POA: Insufficient documentation

## 2011-06-17 DIAGNOSIS — I120 Hypertensive chronic kidney disease with stage 5 chronic kidney disease or end stage renal disease: Secondary | ICD-10-CM | POA: Insufficient documentation

## 2011-06-17 DIAGNOSIS — L089 Local infection of the skin and subcutaneous tissue, unspecified: Secondary | ICD-10-CM | POA: Insufficient documentation

## 2011-06-17 DIAGNOSIS — N186 End stage renal disease: Secondary | ICD-10-CM | POA: Insufficient documentation

## 2011-06-17 MED ORDER — CLINDAMYCIN HCL 150 MG PO CAPS: 150.0000 mg | ORAL_CAPSULE | Freq: Four times a day (QID) | ORAL | Status: AC

## 2011-06-17 MED ORDER — CLINDAMYCIN HCL 150 MG PO CAPS
300.0000 mg | ORAL_CAPSULE | Freq: Once | ORAL | Status: AC
  Administered 2011-06-17: 300 mg via ORAL
  Filled 2011-06-17: qty 2

## 2011-06-17 NOTE — Discharge Instructions (Signed)
  Start the antibiotic prescription in the morning. Soak the left foot in warm water and clean. It well with soap and water 3 times a day. Call your podiatrist for a followup appointment in 2 days. Continue your regular dialysis treatments.     Skin Infections A skin infection usually develops as a result of disruption of the skin barrier.  CAUSES  A skin infection might occur following:  Trauma or an injury to the skin such as a cut or insect sting.   Inflammation (as in eczema).   Breaks in the skin between the toes (as in athlete's foot).   Swelling (edema).  SYMPTOMS  The legs are the most common site affected. Usually there is:  Redness.   Swelling.   Pain.   There may be red streaks in the area of the infection.  TREATMENT   Minor skin infections may be treated with topical antibiotics, but if the skin infection is severe, hospital care and intravenous (IV) antibiotic treatment may be needed.   Most often skin infections can be treated with oral antibiotic medicine as well as proper rest and elevation of the affected area until the infection improves.   If you are prescribed oral antibiotics, it is important to take them as directed and to take all the pills even if you feel better before you have finished all of the medicine.   You may apply warm compresses to the area for 20-30 minutes 4 times daily.  You might need a tetanus shot now if:  You have no idea when you had the last one.   You have never had a tetanus shot before.   Your wound had dirt in it.  If you need a tetanus shot and you decide not to get one, there is a rare chance of getting tetanus. Sickness from tetanus can be serious. If you get a tetanus shot, your arm may swell and become red and warm at the shot site. This is common and not a problem. SEEK MEDICAL CARE IF:  The pain and swelling from your infection do not improve within 2 days.  SEEK IMMEDIATE MEDICAL CARE IF:  You develop a fever,  chills, or other serious problems.  Document Released: 04/17/2004 Document Revised: 02/27/2011 Document Reviewed: 02/28/2008 [facility] Patient Information 2012 [place], [place].

## 2011-06-17 NOTE — ED Notes (Signed)
Patient complaining of left foot pain since "I had an ingrown toenail removed about 2 weeks ago." Left foot skin is dry and scaly. No noted skin breakdown noted on foot or toes. No noted swelling to left foot.

## 2011-06-17 NOTE — ED Notes (Signed)
C/o numbness to bilateral feet x 3 weeks since doctor removed a hang nail on left great toe 3 weeks ago

## 2011-06-17 NOTE — ED Provider Notes (Signed)
History  This chart was scribed for [name], MD by Bennett Scrape. This patient was seen in room APA06/APA06 and the patient's care was started at 8:56PM.  CSN: [id number]  Arrival date & time 06/17/11  1948   First MD Initiated Contact with Patient 06/17/11 2050      Chief Complaint  Patient presents with  . Numbness    to bilateral feet for 3 weeks    The history is provided by the patient. No language interpreter was used.    [name] is a 61 y.o. male who presents to the Emergency Department complaining of  Ingrown toenail, went to Dr. Adam Phenix and had it cut out the 12th of March, 3 to 4 months of bilateral foot numbness, saw him the week after and said it was healing fine, given OxyContin but doesn't help, HTN medications, pain in left foot since   Dr. Montez Morita is PCP  Past Medical History  Diagnosis Date  . Hypertension   . Hyperlipidemia   . Chronic kidney disease   . Arthritis   . Leg pain 10/29/2007    with walking  . Dialysis patient     Past Surgical History  Procedure Date  . Arteriovenous graft placement   . Av fistula placement 12/10/2000    Right brachiocephalic arteriovenous   . Hernia repair     Family History  Problem Relation Age of Onset  . Cancer Mother   . Cancer Father     History  Substance Use Topics  . Smoking status: Current Everyday Smoker -- 1.0 packs/day    Types: Cigarettes  . Smokeless tobacco: Not on file  . Alcohol Use: No      Review of Systems  A complete 10 system review of systems was obtained and is otherwise negative except as noted in the HPI.   Allergies  Review of patient's allergies indicates no known allergies.  Home Medications   Current Outpatient Rx  Name Route Sig Dispense Refill  . BC HEADACHE PO Oral Take 2 packets by mouth 2 (two) times daily as needed. For pain      Triage Vitals: BP 159/74  Pulse 96  Temp(Src) 97.9 F (36.6 C) (Oral)  Resp 16  Ht 5\' 6"  (1.676 m)  Wt 170  lb (77.111 kg)  BMI 27.44 kg/m2  SpO2 100%  Physical Exam  Nursing note and vitals reviewed. Constitutional: He is oriented to person, place, and time. He appears well-developed and well-nourished.  HENT:  Head: Normocephalic and atraumatic.  Eyes: Conjunctivae and EOM are normal. Pupils are equal, round, and reactive to light.  Neck: Normal range of motion and phonation normal. Neck supple.  Cardiovascular: Normal rate, regular rhythm, normal heart sounds and intact distal pulses.   Pulmonary/Chest: Effort normal and breath sounds normal. He exhibits no bony tenderness.  Abdominal: Soft. Normal appearance. There is no tenderness.  Musculoskeletal: Normal range of motion. He exhibits tenderness (Mild tenderneess of toes 2 through 4, first toe is moderately tender, red and slightly swollen; on the diatal tip there is a small scrab, the nail is intact, the cuticle is tender but not swollen).       Left arm has graft with good thrill  Neurological: He is alert and oriented to person, place, and time. He has normal strength. No cranial nerve deficit or sensory deficit. He exhibits normal muscle tone. Coordination normal.  Skin: Skin is warm, dry and intact.  Psychiatric: He has a normal mood  and affect. His behavior is normal. Judgment and thought content normal.    ED Course  Procedures (including critical care time)  DIAGNOSTIC STUDIES: Oxygen Saturation is 100% on room air, normal by my interpretation.    COORDINATION OF CARE: 9:01PM-Discussed x-ray and treatment plan with pt and pt agreed. Advised pt to soak the toe. Advised pt to discuss numbness symptoms with kidney specialist.   Labs Reviewed - No data to display Dg Toe Great Left  06/17/2011  *RADIOLOGY REPORT*  Clinical Data: Bilateral foot numbness; cannot feel toes.  Recently removed ingrown toenail at the left big toe.  LEFT GREAT TOE  Comparison: None.  Findings: There is no definite evidence of fracture or dislocation. There  is chronic apparent irregularity involving the plantar aspect of the distal first phalanx, though osteomyelitis is considered less likely.  Mild overlying soft tissue swelling and soft tissue disruption are noted.  Diffuse vascular calcifications are seen.  IMPRESSION:  1.  No definite evidence of fracture or dislocation. 2.  Chronic apparent irregularity involving the plantar aspect of the distal first phalanx, possibly reflecting chronic bony remodelling; osteomyelitis is considered less likely.  If there is significant clinical concern for osteomyelitis, MRI could be considered for further evaluation. 3.  Diffuse vascular calcifications seen.  Original Report Authenticated By: Tonia Ghent, M.D.     1. Toe infection       MDM  Nonspecific toe pain with redness. Most likely post operative infection. I doubt osteomyelitis,  ischemic toe, foot infection or metabolic instability.   I personally performed the services described in this documentation, which was scribed in my presence. The recorded information has been reviewed and considered.      Plan: Home Medications- Clindamycin; Home Treatments- warm soaks; Recommended follow up- f/u with his Podiatrist in 2-3 days  [name], MD 06/17/11 2202

## 2011-07-18 ENCOUNTER — Encounter: Payer: Self-pay | Admitting: Surgery

## 2011-07-21 ENCOUNTER — Ambulatory Visit (INDEPENDENT_AMBULATORY_CARE_PROVIDER_SITE_OTHER): Payer: Medicare Other | Admitting: Surgery

## 2011-07-21 ENCOUNTER — Other Ambulatory Visit: Payer: Self-pay

## 2011-07-21 ENCOUNTER — Encounter: Payer: Self-pay | Admitting: Surgery

## 2011-07-21 VITALS — BP 118/43 | HR 84 | Resp 18 | Ht 66.0 in | Wt 155.3 lb

## 2011-07-21 DIAGNOSIS — N186 End stage renal disease: Secondary | ICD-10-CM

## 2011-07-21 DIAGNOSIS — I70219 Atherosclerosis of native arteries of extremities with intermittent claudication, unspecified extremity: Secondary | ICD-10-CM | POA: Insufficient documentation

## 2011-07-21 NOTE — Progress Notes (Signed)
[facility] [place]   Patient name: [name] MRN: [id number] DOB: 01/01/1951 Sex: male   Referred by: Dr. Nichols  Reason for referral:  Chief Complaint  Patient presents with  . New Evaluation    06-05-2011 vascular studies bilateral lower extremities [place]   . PVD    HISTORY OF PRESENT ILLNESS: This is a visit for this patient. He has previously been seen in her office for end-stage renal disease. He was seen by Dr. Lawson in 2009 vascular disease but has not had this problem addressed since then. Approximately 3-4 weeks ago he had a ingrown toenail removed. This has not healed. This was his left great toe. He has been treated with antibiotics. The patient is on dialysis and dialyzes on Monday Wednesday and Friday he comes with vascular lab studies which showed calcified vessels and toe pressure of 26 on the left. He is medically managed for his hypertension and hyperlipidemia.  Past Medical History  Diagnosis Date  . Hypertension   . Hyperlipidemia   . Chronic kidney disease   . Arthritis   . Leg pain 10/29/2007    with walking  . Dialysis patient   . Stroke     Past Surgical History  Procedure Date  . Arteriovenous graft placement   . Av fistula placement 12/10/2000    Right brachiocephalic arteriovenous   . Hernia repair     History   Social History  . Marital Status: Married    Spouse Name: N/A    Number of Children: N/A  . Years of Education: N/A   Occupational History  . Not on file.   Social History Main Topics  . Smoking status: Former Smoker -- 1.0 packs/day for 40 years    Types: Cigarettes    Quit date: 03/25/2011  . Smokeless tobacco: Not on file  . Alcohol Use: No  . Drug Use: No  . Sexually Active:    Other Topics Concern  . Not on file   Social History Narrative  . No narrative on file    Family History  Problem Relation Age of Onset  . Cancer Mother   . Cancer Father     Allergies as of 07/21/2011    . (No Known Allergies)    Current Outpatient Prescriptions on File Prior to Visit  Medication Sig Dispense Refill  . amLODipine (NORVASC) 10 MG tablet Take 10 mg by mouth daily.      . Aspirin-Salicylamide-Caffeine (BC HEADACHE PO) Take 2 packets by mouth 2 (two) times daily as needed. For pain      . gabapentin (NEURONTIN) 100 MG capsule Take 100 mg by mouth 2 (two) times daily.      . isosorbide dinitrate (ISORDIL) 30 MG tablet Take 30 mg by mouth daily.      . lisinopril (PRINIVIL,ZESTRIL) 30 MG tablet Take 30 mg by mouth daily.      . metoprolol succinate (TOPROL-XL) 100 MG 24 hr tablet Take 100 mg by mouth 2 (two) times daily. Take with or immediately following a meal.      . omeprazole (PRILOSEC) 20 MG capsule Take 20 mg by mouth daily.      . simvastatin (ZOCOR) 5 MG tablet Take 5 mg by mouth at bedtime.         REVIEW OF SYSTEMS: Positive for pain in legs with walking or lying flat postural weakness and numbness in his leg  PHYSICAL EXAMINATION: General: The patient appears their stated age.    Vital signs are BP 118/43  Pulse 84  Resp 18  Ht 5' 6" (1.676 m)  Wt 155 lb 4.8 oz (70.444 kg)  BMI 25.07 kg/m2 HEENT:  No gross abnormalities Pulmonary: Respirations are non-labored Abdomen: Soft and non-tender  Musculoskeletal: There are no major deformities.   Neurologic: No focal weakness or paresthesias are detected, Skin: There are no ulcer or rashes noted. Psychiatric: The patient has normal affect. Cardiovascular: There is a regular rate and rhythm positive systolic ejection murmur. Palpable femoral pulses, left is 1+ right is 2+ nonpalpable popliteal and pedal pulses  Diagnostic Studies: None today I have reviewed his outside duplex   Assessment:  Peripheral vascular disease with ulceration Plan: Discussed with the patient the complex nature of this problem and that it could potentially lead to limb loss. I feel the first place to start his with a arteriogram. This is  been scheduled for Tuesday, May 7. I will plan on accessing the right groin and studying both legs. I'll intervene on the left leg if possible. The risks and benefits were discussed with the patient. He understands that if we are able to revascularize his leg that he may still require toe amputation. If he is unable to heal in the incision she could potentially end up with a higher amputation. All his questions were answered today.     V. Wells Angelica Wix IV, M.D. [facility] [place] Office: [phone] Pager:  [phone]   

## 2011-07-24 ENCOUNTER — Encounter (HOSPITAL_COMMUNITY): Payer: Self-pay | Admitting: Pharmacy Technician

## 2011-07-28 MED ORDER — SODIUM CHLORIDE 0.9 % IJ SOLN: 3.0000 mL | INTRAMUSCULAR | Status: DC | PRN

## 2011-07-29 ENCOUNTER — Telehealth: Payer: Self-pay | Admitting: Surgery

## 2011-07-29 ENCOUNTER — Encounter (HOSPITAL_COMMUNITY)
Admission: RE | Disposition: A | Discharge status: Home / Self Care | Payer: Self-pay | Source: Ambulatory Visit | Attending: Surgery

## 2011-07-29 ENCOUNTER — Encounter (HOSPITAL_COMMUNITY): Payer: Self-pay | Admitting: Physician Assistant

## 2011-07-29 ENCOUNTER — Ambulatory Visit (HOSPITAL_COMMUNITY)
Admission: RE | Admit: 2011-07-29 | Discharge: 2011-07-29 | Disposition: A | Discharge status: Home / Self Care | Payer: Medicare Other | Source: Ambulatory Visit | Attending: Surgery | Admitting: Surgery

## 2011-07-29 ENCOUNTER — Other Ambulatory Visit: Payer: Self-pay | Admitting: *Deleted

## 2011-07-29 DIAGNOSIS — I739 Peripheral vascular disease, unspecified: Secondary | ICD-10-CM

## 2011-07-29 DIAGNOSIS — Z01810 Encounter for preprocedural cardiovascular examination: Secondary | ICD-10-CM

## 2011-07-29 DIAGNOSIS — I120 Hypertensive chronic kidney disease with stage 5 chronic kidney disease or end stage renal disease: Secondary | ICD-10-CM | POA: Insufficient documentation

## 2011-07-29 DIAGNOSIS — N186 End stage renal disease: Secondary | ICD-10-CM | POA: Insufficient documentation

## 2011-07-29 DIAGNOSIS — Z992 Dependence on renal dialysis: Secondary | ICD-10-CM | POA: Insufficient documentation

## 2011-07-29 DIAGNOSIS — L98499 Non-pressure chronic ulcer of skin of other sites with unspecified severity: Secondary | ICD-10-CM

## 2011-07-29 DIAGNOSIS — M129 Arthropathy, unspecified: Secondary | ICD-10-CM | POA: Insufficient documentation

## 2011-07-29 DIAGNOSIS — L97509 Non-pressure chronic ulcer of other part of unspecified foot with unspecified severity: Secondary | ICD-10-CM | POA: Insufficient documentation

## 2011-07-29 DIAGNOSIS — I70219 Atherosclerosis of native arteries of extremities with intermittent claudication, unspecified extremity: Secondary | ICD-10-CM

## 2011-07-29 DIAGNOSIS — E785 Hyperlipidemia, unspecified: Secondary | ICD-10-CM | POA: Insufficient documentation

## 2011-07-29 DIAGNOSIS — Z8673 Personal history of transient ischemic attack (TIA), and cerebral infarction without residual deficits: Secondary | ICD-10-CM | POA: Insufficient documentation

## 2011-07-29 HISTORY — PX: OTHER SURGICAL HISTORY: SHX169

## 2011-07-29 HISTORY — PX: ABDOMINAL AORTAGRAM: SHX5454

## 2011-07-29 HISTORY — DX: End stage renal disease: N18.6

## 2011-07-29 HISTORY — DX: Anemia, unspecified: D64.9

## 2011-07-29 LAB — POCT I-STAT, CHEM 8
BUN: 33 mg/dL — ABNORMAL HIGH (ref 6–23)
Calcium, Ion: 1.25 mmol/L (ref 1.12–1.32)
HCT: 27 % — ABNORMAL LOW (ref 39.0–52.0)
Hemoglobin: 9.2 g/dL — ABNORMAL LOW (ref 13.0–17.0)
Sodium: 141 mEq/L (ref 135–145)
TCO2: 27 mmol/L (ref 0–100)

## 2011-07-29 SURGERY — ABDOMINAL AORTAGRAM
Anesthesia: LOCAL

## 2011-07-29 MED ORDER — FENTANYL CITRATE 0.05 MG/ML IJ SOLN
INTRAMUSCULAR | Status: AC
  Filled 2011-07-29: qty 2

## 2011-07-29 MED ORDER — MORPHINE SULFATE 4 MG/ML IJ SOLN
INTRAMUSCULAR | Status: AC
  Filled 2011-07-29: qty 1

## 2011-07-29 MED ORDER — MIDAZOLAM HCL 2 MG/2ML IJ SOLN
INTRAMUSCULAR | Status: AC
  Filled 2011-07-29: qty 2

## 2011-07-29 MED ORDER — LIDOCAINE HCL (PF) 1 % IJ SOLN
INTRAMUSCULAR | Status: AC
  Filled 2011-07-29: qty 30

## 2011-07-29 MED ORDER — MORPHINE SULFATE 10 MG/ML IJ SOLN
2.0000 mg | INTRAMUSCULAR | Status: DC | PRN
  Administered 2011-07-29: 4 mg via INTRAVENOUS

## 2011-07-29 MED ORDER — CLONIDINE HCL 0.1 MG PO TABS
0.1000 mg | ORAL_TABLET | Freq: Once | ORAL | Status: AC
  Administered 2011-07-29: 0.1 mg via ORAL
  Filled 2011-07-29 (×2): qty 1

## 2011-07-29 MED ORDER — HEPARIN (PORCINE) IN NACL 2-0.9 UNIT/ML-% IJ SOLN
INTRAMUSCULAR | Status: AC
  Filled 2011-07-29: qty 1000

## 2011-07-29 NOTE — Consult Note (Signed)
CARDIOLOGY CONSULT NOTE  Patient ID: [name], MRN: [id number], DOB/AGE: 08-18-1950 61 y.o. Admit date: 07/29/2011 Date of Consult: 07/29/2011  Primary Cardiologist: New to [place] Chief Complaint: leg pain Reason for Consult: pre-op clearance for left fem-pop bypass grafting  HPI: 60 y/o M with hx of HTN, ESRD, CVA presented to [facility] for planned PV angiography. He was initially seen by [facility] given complaints of non-healing ingrown toenail removal site. Vascular studies showed calcified vessels and toe pressure of 26 on the left. He was brought in for [facility] angio today demonstrating: Impression:  #1 no significant aortoiliac occlusive disease  #2 left common femoral stenosis  #3 occluded left superficial femoral artery with reconstitution of a diseased above-knee popliteal artery and three-vessel runoff  #4 occluded right superficial femoral artery with reconstitution of the above-knee popliteal artery  #5 the patient is not a candidate for endovascular repair. He will be further evaluated for a left femoral to below knee popliteal artery bypass graft We are asked to see the patient for pre-op cardic clearance for surgery some time next week. The plan is for the patient to be discharged today. From a cardiac standpoint, he does not routinely exert himself but the most activity he does is walk up the 22 steps to his friend's apartment. He goes get dyspneic requiring rest by the time he gets to the top. This is not new for him. He denies any CP, palpitations, LEE, orthopnea, PND or syncope. He has never had a heart cath. Post-PV procedure he feels well without complaints.  Past Medical History  Diagnosis Date  . Hypertension   . Hyperlipidemia   . ESRD (end stage renal disease)   . Arthritis   . Leg pain 10/29/2007    with walking  . Stroke   . Anemia   . Diastolic CHF 07/2010     Most Recent Cardiac Studies: 07/2008 2D Echo Study Conclusions Left ventricle: Wall thickness  was increased increased in a pattern of mild to moderate LVH. Systolic function was normal. The estimated ejection fraction was in the range of 50% to 55%. Doppler parameters are consistent with abnormal left ventricular relaxation (grade 1 diastolic dysfunction).   Surgical History:  Past Surgical History  Procedure Date  . Arteriovenous graft placement   . Av fistula placement 12/10/2000    Right brachiocephalic arteriovenous   . Hernia repair      Home Meds: Prior to Admission medications   Medication Sig Start Date End Date Taking? Authorizing Provider  amLODipine (NORVASC) 10 MG tablet Take 10 mg by mouth daily.   Yes Historical Provider, MD  Aspirin-Salicylamide-Caffeine (BC HEADACHE PO) Take 2 packets by mouth 2 (two) times daily as needed. For pain   Yes Historical Provider, MD  calcium acetate (PHOSLO) 667 MG capsule Take 667 mg by mouth See admin instructions. Take 2 tablets with meals and 1 tablet with snacks   Yes Historical Provider, MD  cinacalcet (SENSIPAR) 30 MG tablet Take 30 mg by mouth at bedtime.   Yes Historical Provider, MD  gabapentin (NEURONTIN) 100 MG capsule Take 100 mg by mouth 2 (two) times daily.   Yes Historical Provider, MD  Lanthanum Carbonate (FOSRENOL PO) Take 1,000-2,000 mg by mouth See admin instructions. Take 2000mg  with meals and 1000 mg with snacks   Yes Historical Provider, MD  lisinopril (PRINIVIL,ZESTRIL) 30 MG tablet Take 30 mg by mouth daily.   Yes Historical Provider, MD  metoprolol succinate (TOPROL-XL) 100 MG 24 hr  tablet Take 100 mg by mouth daily. Take with or immediately following a meal.   Yes Historical Provider, MD  multivitamin (RENA-VIT) TABS tablet Take 1 tablet by mouth daily.   Yes Historical Provider, MD  omeprazole (PRILOSEC) 20 MG capsule Take 20 mg by mouth daily.   Yes Historical Provider, MD  sevelamer (RENVELA) 800 MG tablet Take 800 mg by mouth 3 (three) times daily with meals.   Yes Historical Provider, MD    Inpatient  Medications:    . fentaNYL      . heparin      . lidocaine      . midazolam      . midazolam        Allergies: No Known Allergies  History   Social History  . Marital Status: Married    Spouse Name: N/A    Number of Children: N/A  . Years of Education: N/A   Occupational History  . Not on file.   Social History Main Topics  . Smoking status: Former Smoker -- 1.0 packs/day for 40 years    Types: Cigarettes    Quit date: 03/25/2011  . Smokeless tobacco: Not on file  . Alcohol Use: No  . Drug Use: No  . Sexually Active:    Other Topics Concern  . Not on file   Social History Narrative  . No narrative on file     Family History  Problem Relation Age of Onset  . Cancer Mother   . Cancer Father      Review of Systems: General: negative for chills, fever, night sweats or weight changes.  Cardiovascular: see above Dermatological: negative for rash Respiratory: negative for cough or wheezing Urologic: on HD MWF Abdominal: negative for nausea, vomiting, diarrhea, bright red blood per rectum, melena, or hematemesis Neurologic: negative for visual changes, syncope, or dizziness. +hx CVA 2005 All other systems reviewed and are otherwise negative except as noted above.  Labs: Hgb 9.2, Hct 27 (was 13.3/39 on prior Istat 08/2010 but one month prior in 07/2010 was 10.1/33.7)   Lab 07/29/11 0644  NA 141  K 4.8  CL 107  CO2 --  BUN 33*  CREATININE 8.30*  CALCIUM --  PROT --  BILITOT --  ALKPHOS --  ALT --  AST --  GLUCOSE 78   Radiology/Studies:  1. PV angio as above   EKG: NSR 66bpm 1st degree AVB, ICLBBB (QRS 108). Small TWI noted III, avF. Likely LVH criteria. This EKG appears different from his EKG 07/2010 when he had LVH with repol abnormality with deep TWI V4-V and TWI inferiorly  Physical Exam: Blood pressure 133/54, pulse 64, temperature 97.4 F (36.3 C), temperature source Oral, resp. rate 18, height 5\' 6"  (1.676 m), weight 155 lb (70.308 kg), SpO2  95.00%. General: Well developed, chronically ill appearing AAM in no acute distress. Head: Normocephalic, atraumatic, no xanthomas, nares are without discharge. Sclera are icteric and irises show arcus senilis. Neck: Negative for carotid bruits. JVD not elevated. Lungs: Clear bilaterally to auscultation without wheezes, rales, or rhonchi. Breathing is unlabored. Heart: RRR with normal S1 S2. Has a very soft systolic flow murmur. No rubs or gallops appreciated. Abdomen: Soft, non-tender, non-distended with normoactive bowel sounds. No hepatomegaly. No rebound/guarding. No obvious abdominal masses. Msk:  Strength appears normal for age. He has general thinness/atrophy of LE muscles. Extremities: No clubbing or cyanosis. No edema.  Distal pedal pulses are diminished bilaterally. Neuro: Alert and oriented X 3. Moves all extremities spontaneously.  Psych:  Responds to questions appropriately with a normal affect.   Assessment and Plan:   1. Peripheral vascular disease by PV angio today, for possible left femoral to below knee popliteal artery bypass graft - see below re: pre-op thoughts. 2. H/o ESRD - on HD MWF. 3. Hyperlipidemia - would recommend checking lipid panel/LFTs and initiating statin if appropriate. Can defer to outpt setting if patient is going home today. 4. HTN - BP currently controlled. 5. H/o diastolic CHF 07/2010 - euvolemic at present. 6. H/o anemia with decreased Hgb today - recommend this be followed. 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 Patient denies any overt bleeding.  See below regarding comprehensive thoughts. With known vascular disease, he is predisposed to a higher risk category for possible CAD so we will set up for a Lexiscan Myoview in our office this week to r/o underlying ischemia prior to clearing him. A progress note will be written with time/date of test and instructions once  test is scheduled (info will also be placed in discharge section).  Signed, Dayna Dunn PA-C 07/29/2011, 11:11 AM  No history of CAD but extensive vascular disease.  History of diastolic dysfunction.  Labile T waves on previous admissions.  Will arrange Lexiscan myovue prior to vascular surgery.  Charlton Haws 2:48 PM 07/29/2011

## 2011-07-29 NOTE — Progress Notes (Signed)
Pt's BP 206/88 after clonidine 0.1mg . MD Brabham notified of pt's BP and pt not will to stay for further orders or monitoring. RN advised pt & family member BP is unsafe to dc'd at this time. Pt & family member verbalized understanding. Pt signed AMA papers.

## 2011-07-29 NOTE — Telephone Encounter (Addendum)
Message copied by Rosalyn Charters on Tue Jul 29, 2011  3:08 PM ------      Message from: Melene Plan      Created: Tue Jul 29, 2011  9:59 AM                   ----- Message -----         From: [name], MD         Sent: 07/29/2011   8:01 AM           To: Almetta Lovely, RN            07/29/2011, the patient had the following procedures:             1.  ultrasound access right femoral artery       2.  abdominal aortogram       3.  bilateral lower extremity runoff       4.  second order catheterization            I need to see him in the office this coming Monday. He cannot be delayed. Prior to seeing me he needs bilateral lower extremity vein mapping and carotid Doppler study  SPOKE TO PT. AWARE OF APPT. 08-04-11 12:00 BAR

## 2011-07-29 NOTE — H&P (View-Only) (Signed)
[facility] [place]   Patient name: [name] MRN: [id number] DOB: [birth year] Sex: male   Referred by: Dr. Tanda Rockers  Reason for referral:  Chief Complaint  Patient presents with  . New Evaluation    06-05-2011 vascular studies bilateral lower extremities [place]   . PVD    HISTORY OF PRESENT ILLNESS: This is a visit for this patient. He has previously been seen in her office for end-stage renal disease. He was seen by Dr. Hart Rochester in 2009 vascular disease but has not had this problem addressed since then. Approximately 3-4 weeks ago he had a ingrown toenail removed. This has not healed. This was his left great toe. He has been treated with antibiotics. The patient is on dialysis and dialyzes on Monday Wednesday and Friday he comes with vascular lab studies which showed calcified vessels and toe pressure of 26 on the left. He is medically managed for his hypertension and hyperlipidemia.  Past Medical History  Diagnosis Date  . Hypertension   . Hyperlipidemia   . Chronic kidney disease   . Arthritis   . Leg pain 10/29/2007    with walking  . Dialysis patient   . Stroke     Past Surgical History  Procedure Date  . Arteriovenous graft placement   . Av fistula placement 12/10/2000    Right brachiocephalic arteriovenous   . Hernia repair     History   Social History  . Marital Status: Married    Spouse Name: N/A    Number of Children: N/A  . Years of Education: N/A   Occupational History  . Not on file.   Social History Main Topics  . Smoking status: Former Smoker -- 1.0 packs/day for 40 years    Types: Cigarettes    Quit date: 03/25/2011  . Smokeless tobacco: Not on file  . Alcohol Use: No  . Drug Use: No  . Sexually Active:    Other Topics Concern  . Not on file   Social History Narrative  . No narrative on file    Family History  Problem Relation Age of Onset  . Cancer Mother   . Cancer Father     Allergies as of 07/21/2011    . (No Known Allergies)    Current Outpatient Prescriptions on File Prior to Visit  Medication Sig Dispense Refill  . amLODipine (NORVASC) 10 MG tablet Take 10 mg by mouth daily.      . Aspirin-Salicylamide-Caffeine (BC HEADACHE PO) Take 2 packets by mouth 2 (two) times daily as needed. For pain      . gabapentin (NEURONTIN) 100 MG capsule Take 100 mg by mouth 2 (two) times daily.      . isosorbide dinitrate (ISORDIL) 30 MG tablet Take 30 mg by mouth daily.      Marland Kitchen lisinopril (PRINIVIL,ZESTRIL) 30 MG tablet Take 30 mg by mouth daily.      . metoprolol succinate (TOPROL-XL) 100 MG 24 hr tablet Take 100 mg by mouth 2 (two) times daily. Take with or immediately following a meal.      . omeprazole (PRILOSEC) 20 MG capsule Take 20 mg by mouth daily.      . simvastatin (ZOCOR) 5 MG tablet Take 5 mg by mouth at bedtime.         REVIEW OF SYSTEMS: Positive for pain in legs with walking or lying flat postural weakness and numbness in his leg  PHYSICAL EXAMINATION: General: The patient appears their stated age.  Vital signs are BP 118/43  Pulse 84  Resp 18  Ht 5\' 6"  (1.676 m)  Wt 155 lb 4.8 oz (70.444 kg)  BMI 25.07 kg/m2 HEENT:  No gross abnormalities Pulmonary: Respirations are non-labored Abdomen: Soft and non-tender  Musculoskeletal: There are no major deformities.   Neurologic: No focal weakness or paresthesias are detected, Skin: There are no ulcer or rashes noted. Psychiatric: The patient has normal affect. Cardiovascular: There is a regular rate and rhythm positive systolic ejection murmur. Palpable femoral pulses, left is 1+ right is 2+ nonpalpable popliteal and pedal pulses  Diagnostic Studies: None today I have reviewed his outside duplex   Assessment:  Peripheral vascular disease with ulceration Plan: Discussed with the patient the complex nature of this problem and that it could potentially lead to limb loss. I feel the first place to start his with a arteriogram. This is  been scheduled for Tuesday, May 7. I will plan on accessing the right groin and studying both legs. I'll intervene on the left leg if possible. The risks and benefits were discussed with the patient. He understands that if we are able to revascularize his leg that he may still require toe amputation. If he is unable to heal in the incision she could potentially end up with a higher amputation. All his questions were answered today.     Jorge Ny, M.D. [facility] [place] Office: [phone] Pager:  [phone]

## 2011-07-29 NOTE — Telephone Encounter (Addendum)
Message copied by Rosalyn Charters on Tue Jul 29, 2011  3:17 PM ------      Message from: Melene Plan      Created: Tue Jul 29, 2011  9:59 AM                   ----- Message -----         From: [name], MD         Sent: 07/29/2011   8:01 AM           To: Almetta Lovely, RN            07/29/2011, the patient had the following procedures:             1.  ultrasound access right femoral artery       2.  abdominal aortogram       3.  bilateral lower extremity runoff       4.  second order catheterization            I need to see him in the office this coming Monday. He cannot be delayed. Prior to seeing me he needs bilateral lower extremity vein mapping and carotid Doppler study  PT. AND WIFE AWARE OF APPT. 08-04-11

## 2011-07-29 NOTE — Progress Notes (Signed)
The patient is scheduled for Lexiscan Myoview 07/31/11 at 7:30am in our [place] office. (Unable to fit patient in until next week in [place] ofc). Per Dr. Eden Emms, he will also have an OV that same day with one of our extenders. Information and instructions were left in discharge section in Provider Navigator.  Esthela Brandner PA-C

## 2011-07-29 NOTE — Op Note (Signed)
[facility]  Patient name: [name] MRN: [id number] DOB: Oct 15, 1950 Sex: male  07/29/2011 Pre-operative Diagnosis: Left great toe ulcer Post-operative diagnosis:  Same Surgeon:  Jorge Ny Procedure Performed:  1.  ultrasound access right femoral artery  2.  abdominal aortogram  3.  bilateral lower extremity runoff  4.  second order catheterization    Indications:  The patient presents with a left great toe ulcer. Ultrasound revealed significantly decreased toe pressures he comes in today for further evaluation  Procedure:  The patient was identified in the holding area and taken to room 8.  The patient was then placed supine on the table and prepped and draped in the usual sterile fashion.  A time out was called.  Ultrasound was used to evaluate the right common femoral artery.  It was patent .  A digital ultrasound image was acquired. The right common femoral artery was then accessed under ultrasound guidance with an 18-gauge needle. A Benson wire was advanced into the aorta without resistance and a 5 French sheath was placed. Over the wire an omni-flush catheter was advanced to the level of L1 and an abdominal aortogram was performed. Next the catheter was pulled down to the aortic bifurcation and pelvic angiograms were performed in multiple obliquities. Next using the Omni flush catheter and a Glidewire and a straight catheter access was obtained into the left external iliac artery and left leg runoff was performed. right runoff was performed via retrograde sheath injections.  Findings:   Aortogram:  The visualized portions of the suprarenal abdominal aorta showed no significant stenosis. The renal arteries are not well evaluated. A densely calcified left kidney is visualized. The infrarenal abdominal aorta is heavily calcified but no stenosis is identified. Bilateral common and external iliac arteries are patent and severely calcified. There is  a high-grade stenosis at the origin of the right hypogastric artery.  Right Lower Extremity:  The right common femoral artery is patent throughout it's course right profunda femoral artery is patent. The right superficial femoral artery is occluded at its origin. There is reconstitution of the popliteal artery within the adductor canal. Diffuse tibial disease is identified. There is what looks like a high takeoff of the posterior tibial artery at the knee joint however due to the course of this vessel is also via collateral. The anterior tibial and peroneal artery are diseased but patent.  Left Lower Extremity:  The left common femoral artery is diffusely diseased and very calcified. The profunda femoral artery is patent throughout it's course. The superficial femoral artery is occluded it does reconstitute at the above-knee popliteal artery however there are 2 tandem stenoses within the above-knee popliteal artery. The below knee popliteal artery is patent throughout it's course and there is three-vessel runoff via a diseased tibial vessels  Intervention:  None performed today.  Impression:  #1  no significant aortoiliac occlusive disease  #2  left common femoral stenosis  #3  occluded left superficial femoral artery with reconstitution of a diseased above-knee popliteal artery and three-vessel runoff  #4  occluded right superficial femoral artery with reconstitution of the above-knee popliteal artery  #5  the patient is not a candidate for endovascular repair. He will be further evaluated for a left femoral to below knee popliteal artery bypass graft   V. Durene Cal, M.D. [facility] Office: [phone] Pager:  [phone]

## 2011-07-29 NOTE — Interval H&P Note (Signed)
History and Physical Interval Note:  07/29/2011 6:50 AM  [name]  has presented today for surgery, with the diagnosis of pvd  The various methods of treatment have been discussed with the patient and family. After consideration of risks, benefits and other options for treatment, the patient has consented to  Procedure(s) (LRB): ABDOMINAL AORTAGRAM (N/A) as a surgical intervention .  The patients' history has been reviewed, patient examined, no change in status, stable for surgery.  I have reviewed the patients' chart and labs.  Questions were answered to the patient's satisfaction.     Krishana Lutze IV, V. WELLS

## 2011-07-29 NOTE — Discharge Instructions (Signed)
You have a Stress Test scheduled on Thursday 07/31/11 at 7:30am [employer] in [place]. Your doctor has ordered this test to get a better idea of how your heart works.  Please arrive 15 minutes early for paperwork.   Location: [address] [place], [place] [address] [phone]  Instructions:  No food/drink after midnight the night before.   No caffeine/decaf products 24 hours before, including medicines such as Excedrin or Goody Powders. Call if there are any questions.   Wear comfortable clothes and shoes.   It is OK to take your morning meds with a sip of water EXCEPT for those types of medicines listed below or otherwise instructed.  Special Medication Instructions:  Beta blockers such as metoprolol (Lopressor/Toprol XL), atenolol (Tenormin), carvedilol (Coreg), nebivolol (Bystolic), propranolol (Inderal) should not be taken for 24 hours before the test.  Calcium channel blockers such as diltiazem (Cardizem) or verapmil (Calan) should not be taken for 24 hours before the test.  Remove nitroglycerin patches and do not take nitrate preparations such as Imdur/isosorbide the day of your test.  No Persantine/Theophylline or Aggrenox medicines should be used within 24 hours of the test.   What To Expect: The whole test will take several hours. When you arrive in the lab, the technician will inject a small amount of radioactive tracer into your arm through an IV while you are resting quietly. This helps [place] to form pictures of your heart. You will likely only feel a sting from the IV. After a waiting period, resting pictures will be obtained under a big camera. These are the "before" pictures.  Next, you will be prepped for the stress portion of the test. This may include either walking on a treadmill or receiving a medicine that helps to dilate blood vessels in your heart to simulate the effect of exercise on your heart. If you are walking on a treadmill, you will  walk at different paces to try to get your heart rate to a goal number that is based on your age. If your doctor has chosen the pharmacologic test, then you will receive a medicine through your IV that may cause temporary nausea, flushing, shortness of breath and sometimes chest discomfort or vomiting. This is typically short-lived and usually resolves quickly. Your blood pressure and heart rate will be monitored, and we will be watching your EKG on a computer screen for any changes. During this portion of the test, the radiologist will inject another small amount of radioactive tracer into your IV. After a waiting period, you will undergo a second set of pictures. These are the "after" pictures. The doctor will compare the before-and-after images to look for evidence of heart blockages or heart weakness.   --------------------  Groin Site Care Refer to this sheet in the next few weeks. These instructions provide you with information on caring for yourself after your procedure. Your caregiver may also give you more specific instructions. Your treatment has been planned according to current medical practices, but problems sometimes occur. Call your caregiver if you have any problems or questions after your procedure. HOME CARE INSTRUCTIONS  You may shower 24 hours after the procedure. Remove the bandage (dressing) and gently wash the site with plain soap and water. Gently pat the site dry.   Do not apply powder or lotion to the site.   Do not sit in a bathtub, swimming pool, or whirlpool for 5 to 7 days.   No bending, squatting, or lifting  anything over 10 pounds (4.5 kg) as directed by your caregiver.   Inspect the site at least twice daily.   Do not drive home if you are discharged the same day of the procedure. Have someone else drive you.   You may drive 24 hours after the procedure unless otherwise instructed by your caregiver.  What to expect:  Any bruising will usually fade within 1 to 2  weeks.   Blood that collects in the tissue (hematoma) may be painful to the touch. It should usually decrease in size and tenderness within 1 to 2 weeks.  SEEK IMMEDIATE MEDICAL CARE IF:  You have unusual pain at the groin site or down the affected leg.   You have redness, warmth, swelling, or pain at the groin site.   You have drainage (other than a small amount of blood on the dressing).   You have chills.   You have a fever or persistent symptoms for more than 72 hours.   You have a fever and your symptoms suddenly get worse.   Your leg becomes pale, cool, tingly, or numb.   You have heavy bleeding from the site. Hold pressure on the site.  Document Released: 04/12/2010 Document Revised: 02/27/2011 Document Reviewed: 04/12/2010 [facility] Patient Information 2012 [place], [place].

## 2011-07-29 NOTE — Progress Notes (Signed)
Pt's BP 198/90. MD Brabham notified. Waiting for additional orders. Will continue to monitor pt.

## 2011-07-30 ENCOUNTER — Other Ambulatory Visit (HOSPITAL_COMMUNITY): Payer: Self-pay | Admitting: Cardiovascular Disease

## 2011-07-30 DIAGNOSIS — R079 Chest pain, unspecified: Secondary | ICD-10-CM

## 2011-07-31 ENCOUNTER — Encounter: Payer: Self-pay | Admitting: *Deleted

## 2011-07-31 ENCOUNTER — Encounter (HOSPITAL_COMMUNITY): Payer: Self-pay | Admitting: Pharmacy Technician

## 2011-07-31 ENCOUNTER — Encounter: Payer: Self-pay | Admitting: Nurse Practitioner

## 2011-07-31 ENCOUNTER — Ambulatory Visit (INDEPENDENT_AMBULATORY_CARE_PROVIDER_SITE_OTHER)
Admission: RE | Admit: 2011-07-31 | Discharge: 2011-07-31 | Disposition: A | Discharge status: Home / Self Care | Payer: Medicare Other | Source: Ambulatory Visit | Attending: Nurse Practitioner | Admitting: Nurse Practitioner

## 2011-07-31 ENCOUNTER — Ambulatory Visit (HOSPITAL_BASED_OUTPATIENT_CLINIC_OR_DEPARTMENT_OTHER): Payer: Medicare Other | Admitting: Radiology

## 2011-07-31 ENCOUNTER — Ambulatory Visit (INDEPENDENT_AMBULATORY_CARE_PROVIDER_SITE_OTHER): Payer: Medicare Other | Admitting: Nurse Practitioner

## 2011-07-31 VITALS — BP 155/80 | HR 60 | Resp 20 | Ht 66.0 in | Wt 144.0 lb

## 2011-07-31 VITALS — BP 165/80 | HR 55 | Ht 66.0 in | Wt 147.0 lb

## 2011-07-31 DIAGNOSIS — R0989 Other specified symptoms and signs involving the circulatory and respiratory systems: Secondary | ICD-10-CM

## 2011-07-31 DIAGNOSIS — I739 Peripheral vascular disease, unspecified: Secondary | ICD-10-CM | POA: Insufficient documentation

## 2011-07-31 DIAGNOSIS — I639 Cerebral infarction, unspecified: Secondary | ICD-10-CM

## 2011-07-31 DIAGNOSIS — E785 Hyperlipidemia, unspecified: Secondary | ICD-10-CM

## 2011-07-31 DIAGNOSIS — I10 Essential (primary) hypertension: Secondary | ICD-10-CM

## 2011-07-31 DIAGNOSIS — I6350 Cerebral infarction due to unspecified occlusion or stenosis of unspecified cerebral artery: Secondary | ICD-10-CM

## 2011-07-31 DIAGNOSIS — R079 Chest pain, unspecified: Secondary | ICD-10-CM | POA: Insufficient documentation

## 2011-07-31 DIAGNOSIS — Z992 Dependence on renal dialysis: Secondary | ICD-10-CM | POA: Insufficient documentation

## 2011-07-31 DIAGNOSIS — I70219 Atherosclerosis of native arteries of extremities with intermittent claudication, unspecified extremity: Secondary | ICD-10-CM

## 2011-07-31 DIAGNOSIS — Z8679 Personal history of other diseases of the circulatory system: Secondary | ICD-10-CM | POA: Insufficient documentation

## 2011-07-31 DIAGNOSIS — Z87891 Personal history of nicotine dependence: Secondary | ICD-10-CM | POA: Insufficient documentation

## 2011-07-31 DIAGNOSIS — R5383 Other fatigue: Secondary | ICD-10-CM | POA: Insufficient documentation

## 2011-07-31 DIAGNOSIS — F17200 Nicotine dependence, unspecified, uncomplicated: Secondary | ICD-10-CM | POA: Insufficient documentation

## 2011-07-31 DIAGNOSIS — Z01810 Encounter for preprocedural cardiovascular examination: Secondary | ICD-10-CM

## 2011-07-31 DIAGNOSIS — N186 End stage renal disease: Secondary | ICD-10-CM

## 2011-07-31 DIAGNOSIS — R0609 Other forms of dyspnea: Secondary | ICD-10-CM | POA: Insufficient documentation

## 2011-07-31 DIAGNOSIS — I5032 Chronic diastolic (congestive) heart failure: Secondary | ICD-10-CM

## 2011-07-31 DIAGNOSIS — R5381 Other malaise: Secondary | ICD-10-CM | POA: Insufficient documentation

## 2011-07-31 DIAGNOSIS — R0602 Shortness of breath: Secondary | ICD-10-CM

## 2011-07-31 LAB — CBC WITH DIFFERENTIAL/PLATELET
Basophils Relative: 0.7 % (ref 0.0–3.0)
Eosinophils Relative: 4.6 % (ref 0.0–5.0)
Lymphocytes Relative: 28.5 % (ref 12.0–46.0)
MCV: 90.9 fl (ref 78.0–100.0)
Monocytes Absolute: 0.3 10*3/uL (ref 0.1–1.0)
Monocytes Relative: 5.9 % (ref 3.0–12.0)
Neutrophils Relative %: 60.3 % (ref 43.0–77.0)
Platelets: 183 10*3/uL (ref 150.0–400.0)
RBC: 3.4 Mil/uL — ABNORMAL LOW (ref 4.22–5.81)
WBC: 5.4 10*3/uL (ref 4.5–10.5)

## 2011-07-31 LAB — BASIC METABOLIC PANEL
BUN: 25 mg/dL — ABNORMAL HIGH (ref 6–23)
Chloride: 103 mEq/L (ref 96–112)
GFR: 8.93 mL/min — CL (ref >60.00)
Glucose, Bld: 78 mg/dL (ref 70–99)
Potassium: 5 mEq/L (ref 3.5–5.1)
Sodium: 139 mEq/L (ref 135–145)

## 2011-07-31 MED ORDER — ASPIRIN EC 81 MG PO TBEC: 81.0000 mg | DELAYED_RELEASE_TABLET | Freq: Every day | ORAL | Status: DC

## 2011-07-31 MED ORDER — REGADENOSON 0.4 MG/5ML IV SOLN
0.4000 mg | Freq: Once | INTRAVENOUS | Status: AC
  Administered 2011-07-31: 0.4 mg via INTRAVENOUS

## 2011-07-31 MED ORDER — TECHNETIUM TC 99M TETROFOSMIN IV KIT
10.0000 | PACK | Freq: Once | INTRAVENOUS | Status: AC | PRN
  Administered 2011-07-31: 10 via INTRAVENOUS

## 2011-07-31 MED ORDER — TECHNETIUM TC 99M TETROFOSMIN IV KIT
30.0000 | PACK | Freq: Once | INTRAVENOUS | Status: AC | PRN
  Administered 2011-07-31: 30 via INTRAVENOUS

## 2011-07-31 NOTE — Patient Instructions (Signed)
Your physician has requested that you have a cardiac catheterization. Cardiac catheterization is used to diagnose and/or treat various heart conditions. Doctors may recommend this procedure for a number of different reasons. The most common reason is to evaluate chest pain. Chest pain can be a symptom of coronary artery disease (CAD), and cardiac catheterization can show whether plaque is narrowing or blocking your heart's arteries. This procedure is also used to evaluate the valves, as well as measure the blood flow and oxygen levels in different parts of your heart. For further information please visit [email or web address] Please follow instruction sheet, as given.  Start taking Aspirin 81mg  daily.

## 2011-07-31 NOTE — Progress Notes (Signed)
Patient Name: [name] Date of Encounter: 07/31/2011  Primary Care Provider:  No primary provider on file. Primary Cardiologist:  P. Eden Emms, MD  Patient Profile  61 y/o male with history of peripheral vascular disease pending lower extremity bypass on the left who presents for preoperative evaluation.  Problem List   Past Medical History  Diagnosis Date  . Hypertension   . Hyperlipidemia   . ESRD (end stage renal disease)     a. MWF dialysis in [place] (followed by Dr. Kristian Covey)  . Arthritis   . Claudication   . Stroke 2005  . Anemia   . Chronic diastolic CHF (congestive heart failure) 07/2010    a. 07/2008 Echo EF 50-55%, mild-mod LVH, Gr 1 DD.  Marland Kitchen Dyspnea on exertion     a. 07/2011 Myoview: Inf infarct with peri-infarct ischemia, ef 44% (prelim reading)  . Peripheral vascular occlusive disease     a. 07/2011 Periph Angio: No signif Ao-illiac dzs, LCF stenosis, 100% LSFA w recon above knee pop and 3 vessel runoff, 100% RSFA w/ recon above knee --> pending L Fem to below Knee pop bypass.  Marland Kitchen History of tobacco abuse    Past Surgical History  Procedure Date  . Arteriovenous graft placement   . Av fistula placement 12/10/2000    Right brachiocephalic arteriovenous   . Hernia repair     Allergies  No Known Allergies  HPI  61 year old male with the above problem list.  He has a history of lower extremity claudication as well as poorly healing ingrown toenail.  He was recently evaluated by vascular surgery and underwent peripheral angiography revealing severe bilateral lower extremity disease and it is felt that he will require left lower extremity bypass as outlined above.  He was seen by our team in the hospital for preoperative clearance with recommendation for outpatient stress testing, which was performed this morning.  Preliminary review of the study by Dr. Myrtis Ser shows inferior fixed defect in with peri-infarct ischemia.  Patient has never undergone cardiac  catheterization.  He denies any history of chest pain however experiences dyspnea on exertion after walking only about 100 to 150 feet.    Home Medications  Prior to Admission medications   Medication Sig Start Date End Date Taking? Authorizing Provider  amLODipine (NORVASC) 10 MG tablet Take 10 mg by mouth daily.   Yes Historical Provider, MD  Aspirin-Salicylamide-Caffeine (BC HEADACHE PO) Take 2 packets by mouth 2 (two) times daily as needed. For pain   Yes Historical Provider, MD  calcium acetate (PHOSLO) 667 MG capsule Take 667 mg by mouth See admin instructions. Take 2 tablets with meals and 1 tablet with snacks   Yes Historical Provider, MD  cinacalcet (SENSIPAR) 30 MG tablet Take 30 mg by mouth at bedtime.   Yes Historical Provider, MD  gabapentin (NEURONTIN) 100 MG capsule Take 100 mg by mouth 2 (two) times daily.   Yes Historical Provider, MD  Lanthanum Carbonate (FOSRENOL PO) Take 1,000-2,000 mg by mouth See admin instructions. Take 2000mg  with meals and 1000 mg with snacks   Yes Historical Provider, MD  lisinopril (PRINIVIL,ZESTRIL) 30 MG tablet Take 30 mg by mouth daily.   Yes Historical Provider, MD  metoprolol succinate (TOPROL-XL) 100 MG 24 hr tablet Take 100 mg by mouth daily. Take with or immediately following a meal.   Yes Historical Provider, MD  multivitamin (RENA-VIT) TABS tablet Take 1 tablet by mouth daily.   Yes Historical Provider, MD  omeprazole (PRILOSEC) 20  MG capsule Take 20 mg by mouth daily.   Yes Historical Provider, MD  sevelamer (RENVELA) 800 MG tablet Take 800 mg by mouth 3 (three) times daily with meals.   Yes Historical Provider, MD  aspirin EC 81 MG tablet Take 1 tablet (81 mg total) by mouth daily. 07/31/11 07/30/12  Ok Anis, NP    Family History  Family History  Problem Relation Age of Onset  . Cancer Mother   . Cancer Father     Social History  History   Social History  . Marital Status: Married    Spouse Name: N/A    Number of  Children: N/A  . Years of Education: N/A   Occupational History  . Not on file.   Social History Main Topics  . Smoking status: Former Smoker -- 1.0 packs/day for 40 years    Types: Cigarettes    Quit date: 03/25/2011  . Smokeless tobacco: Not on file  . Alcohol Use: No  . Drug Use: No  . Sexually Active:    Other Topics Concern  . Not on file   Social History Narrative   Lives in [place].  His cousin helps him out and drives him to appts. He quit smoking earlier this year.     Review of Systems General:  No chills, fever, night sweats or weight changes.  Cardiovascular:  No chest pain, + dyspnea on exertion.  No edema, orthopnea, palpitations, paroxysmal nocturnal dyspnea.  He has bilat LE claudication. Dermatological: No rash, lesions/masses Respiratory: No cough, dyspnea Urologic: No hematuria, dysuria Abdominal:   No nausea, vomiting, diarrhea, bright red blood per rectum, melena, or hematemesis Neurologic:  No visual changes, wkns, changes in mental status. All other systems reviewed and are otherwise negative except as noted above.  Physical Exam  Blood pressure 155/80, pulse 60, resp. rate 20, height 5\' 6"  (1.676 m), weight 144 lb (65.318 kg).  General: Pleasant, NAD Psych: Normal affect. Neuro: Alert and oriented X 3. Moves all extremities spontaneously. HEENT: Normal  Neck: Supple without JVD.  He has a fairly loud right carotid bruit and a softer left carotid bruit. Lungs:  Resp regular and unlabored, diminished breath sounds right base otw CTA. Heart: RRR.  2/6 syst murmur heard throughout. Abdomen: Soft, non-tender, non-distended, BS + x 4.  Extremities: No clubbing, cyanosis or edema. R radial was 1+ at best.  Unable to easily palpate dp/pt's.  No abdominal or femoral bruits noted.  Accessory Clinical Findings  ECG - rsr, 1st deg avb, lvh  Assessment & Plan  1.  Dyspnea with abnormal stress test:  Patient presented for preoperative clearance and  underwent stress testing this morning which suggests prior inferior infarct and peri-infarct ischemia.  He has never had coronary angiography in the past.  After discussion with Dr. Myrtis Ser and Dr. Antoine Poche, we have arranged for an outpatient cardiac catheterization tomorrow at [facility].  Tomorrow is her usual dialysis day for Mr. Irizarry and he will undergo dialysis in the morning and presented to short stay for diagnostic cath.  We will obtain labs and chest x-ray today.  I have added aspirin 81 mg daily to his current regimen.  2.  Peripheral vascular disease:  As above, patient will require diagnostic cardiac catheterization prior to proceeding with lower extremity bypass surgery.  3.  Hypertension:  Continue home medications.  Blood pressure is elevated today.  4.  Carotid bruits:  I see he is already scheduled for carotid Dopplers on May 14.  5.  End stage renal disease: Continue Monday Wednesday Friday dialysis.  6.  Disposition: Followup with Dr. Eden Emms in approximately 2-3 weeks following catheterization.  Nicolasa Ducking, NP 07/31/2011, 1:03 PM

## 2011-07-31 NOTE — Progress Notes (Signed)
[facility] SITE 3 NUCLEAR MED [address] [place] [address] [phone]  Cardiology Nuclear Med Study  ARNEZ STONEKING is a 61 y.o. male     MRN : [id number]     DOB: [birth year]  Procedure Date: 07/31/2011  Nuclear Med Background Indication for Stress Test:  Evaluation for Ischemia, Patient seen in hospital on 07/29/11 with leg pain,Chronic DOE>Surgical Clearance for (L) Fem/Pop Bypass Graft  History:  ~10 yrs ago MPS([place]) per pt-no report; '10 Echo:EF=50-55% Cardiac Risk Factors: Claudication, CVA, History of Smoking-Started Back Smoking per patient today, Hypertension, Lipids and PVD  Symptoms:  DOE and Chronic Fatigue   Nuclear Pre-Procedure Caffeine/Decaff Intake:  5:00pm NPO After: 5:00pm   Lungs:  clear O2 Sat: 100% on room air. IV 0.9% NS with Angio Cath:  22g  IV Site: R Hand x 1, tolerated well IV Started by:  Irean Hong, RN  Chest Size (in):  40 Cup Size: n/a  Height: 5\' 6"  (1.676 m)  Weight:  147 lb (66.679 kg)  BMI:  Body mass index is 23.73 kg/(m^2). Tech Comments:  Held toprol x 24 hrs    Nuclear Med Study 1 or 2 day study: 1 day  Stress Test Type:  Lexiscan  Reading MD: Marca Ancona, MD  Order Authorizing Provider:  Charlton Haws, MD  Resting Radionuclide: Technetium 67m Tetrofosmin  Resting Radionuclide Dose: 11.0 mCi   Stress Radionuclide:  Technetium 30m Tetrofosmin  Stress Radionuclide Dose: 31.0 mCi           Stress Protocol Rest HR: 55 Stress HR: 71  Rest BP: 165/80 Stress BP: 123/56  Exercise Time (min): n/a METS: n/a   Predicted Max HR: 158 bpm % Max HR: 44.94 bpm Rate Pressure Product: 8733   Dose of Adenosine (mg):  n/a Dose of Lexiscan: 0.4 mg  Dose of Atropine (mg): n/a Dose of Dobutamine: n/a mcg/kg/min (at max HR)  Stress Test Technologist: Smiley Houseman, CMA-N  Nuclear Technologist:  Domenic Polite, CNMT     Rest Procedure:  Myocardial perfusion imaging was performed at rest 45 minutes following the  intravenous administration of Technetium 74m Tetrofosmin.  Rest ECG: LVH.  Stress Procedure:  The patient received IV Lexiscan 0.4 mg over 15-seconds.  Technetium 64m Tetrofosmin injected at 30-seconds.  There were no significant changes with Lexiscan bolus, but there were T-wave changes in recovery.  Quantitative spect images were obtained after a 45 minute delay.  Stress ECG: No significant change from baseline ECG  QPS Raw Data Images:  Normal; no motion artifact; normal heart/lung ratio. Stress Images:  Large, severe basal to mid inferior perfusion defect.  Rest Images:  Large, severe inferior perfusion defect.  Subtraction (SDS):  Primarily fixed large, severe inferior perfusion defect.  Transient Ischemic Dilatation (Normal <1.22):  1.03 Lung/Heart Ratio (Normal <0.45):  0.30  Quantitative Gated Spect Images QGS EDV:  202 ml QGS ESV:  114 ml  Impression Exercise Capacity:  Lexiscan with no exercise. BP Response:  Normal blood pressure response. Clinical Symptoms:  Abdominal cramping ECG Impression:  No significant ST segment change suggestive of ischemia. Comparison with Prior Nuclear Study: No images to compare  Overall Impression:  Abnormal stress nuclear study.  Large, severe primarily fixed basal to mid inferior perfusion defect suggests prior infarction with peri-infarction ischemia.   LV Ejection Fraction: 44%.  LV Wall Motion:  Inferior hypokinesis.   Lori Liew [employer]

## 2011-08-01 ENCOUNTER — Ambulatory Visit (HOSPITAL_COMMUNITY)
Admission: RE | Admit: 2011-08-01 | Discharge: 2011-08-01 | Disposition: A | Discharge status: Home / Self Care | Payer: Medicare Other | Source: Ambulatory Visit | Attending: Cardiology | Admitting: Cardiology

## 2011-08-01 ENCOUNTER — Encounter: Payer: Self-pay | Admitting: Surgery

## 2011-08-01 ENCOUNTER — Encounter (HOSPITAL_COMMUNITY)
Admission: RE | Disposition: A | Discharge status: Home / Self Care | Payer: Self-pay | Source: Ambulatory Visit | Attending: Cardiology

## 2011-08-01 DIAGNOSIS — I509 Heart failure, unspecified: Secondary | ICD-10-CM | POA: Insufficient documentation

## 2011-08-01 DIAGNOSIS — E785 Hyperlipidemia, unspecified: Secondary | ICD-10-CM | POA: Insufficient documentation

## 2011-08-01 DIAGNOSIS — I2510 Atherosclerotic heart disease of native coronary artery without angina pectoris: Secondary | ICD-10-CM

## 2011-08-01 DIAGNOSIS — Z992 Dependence on renal dialysis: Secondary | ICD-10-CM | POA: Insufficient documentation

## 2011-08-01 DIAGNOSIS — N186 End stage renal disease: Secondary | ICD-10-CM | POA: Insufficient documentation

## 2011-08-01 DIAGNOSIS — I5032 Chronic diastolic (congestive) heart failure: Secondary | ICD-10-CM | POA: Insufficient documentation

## 2011-08-01 DIAGNOSIS — I120 Hypertensive chronic kidney disease with stage 5 chronic kidney disease or end stage renal disease: Secondary | ICD-10-CM | POA: Insufficient documentation

## 2011-08-01 HISTORY — PX: LEFT HEART CATHETERIZATION WITH CORONARY ANGIOGRAM: SHX5451

## 2011-08-01 HISTORY — PX: CARDIAC CATHETERIZATION: SHX172

## 2011-08-01 LAB — PROTIME-INR
INR: 1.08 (ref 0.00–1.49)
Prothrombin Time: 14.2 seconds (ref 11.6–15.2)

## 2011-08-01 LAB — APTT: aPTT: 36 seconds (ref 24–37)

## 2011-08-01 SURGERY — LEFT HEART CATHETERIZATION WITH CORONARY ANGIOGRAM
Anesthesia: LOCAL

## 2011-08-01 MED ORDER — ASPIRIN 81 MG PO CHEW
324.0000 mg | CHEWABLE_TABLET | ORAL | Status: AC
  Administered 2011-08-01: 324 mg via ORAL

## 2011-08-01 MED ORDER — ACETAMINOPHEN 325 MG PO TABS: 650.0000 mg | ORAL_TABLET | ORAL | Status: DC | PRN

## 2011-08-01 MED ORDER — SODIUM CHLORIDE 0.9 % IV SOLN: 1.0000 mL/kg/h | INTRAVENOUS | Status: DC

## 2011-08-01 MED ORDER — HEPARIN (PORCINE) IN NACL 2-0.9 UNIT/ML-% IJ SOLN
INTRAMUSCULAR | Status: AC
  Filled 2011-08-01: qty 2000

## 2011-08-01 MED ORDER — FENTANYL CITRATE 0.05 MG/ML IJ SOLN
INTRAMUSCULAR | Status: AC
  Filled 2011-08-01: qty 2

## 2011-08-01 MED ORDER — NITROGLYCERIN 0.2 MG/ML ON CALL CATH LAB
INTRAVENOUS | Status: AC
  Filled 2011-08-01: qty 1

## 2011-08-01 MED ORDER — SODIUM CHLORIDE 0.9 % IJ SOLN: 3.0000 mL | INTRAMUSCULAR | Status: DC | PRN

## 2011-08-01 MED ORDER — SODIUM CHLORIDE 0.9 % IJ SOLN: 3.0000 mL | Freq: Two times a day (BID) | INTRAMUSCULAR | Status: DC

## 2011-08-01 MED ORDER — ONDANSETRON HCL 4 MG/2ML IJ SOLN: 4.0000 mg | Freq: Four times a day (QID) | INTRAMUSCULAR | Status: DC | PRN

## 2011-08-01 MED ORDER — MIDAZOLAM HCL 2 MG/2ML IJ SOLN
INTRAMUSCULAR | Status: AC
  Filled 2011-08-01: qty 2

## 2011-08-01 MED ORDER — SODIUM CHLORIDE 0.9 % IV SOLN
250.0000 mL | INTRAVENOUS | Status: DC | PRN
  Administered 2011-08-01: 10 mL/h via INTRAVENOUS

## 2011-08-01 MED ORDER — ASPIRIN 81 MG PO CHEW
CHEWABLE_TABLET | ORAL | Status: AC
  Administered 2011-08-01: 324 mg via ORAL
  Filled 2011-08-01: qty 4

## 2011-08-01 MED ORDER — LIDOCAINE HCL (PF) 1 % IJ SOLN
INTRAMUSCULAR | Status: AC
  Filled 2011-08-01: qty 30

## 2011-08-01 NOTE — H&P (View-Only) (Signed)
 Patient Name: [name] Date of Encounter: 07/31/2011  Primary Care Provider:  No primary provider on file. Primary Cardiologist:  P. Nishan, MD  Patient Profile  61 y/o male with history of peripheral vascular disease pending lower extremity bypass on the left who presents for preoperative evaluation.  Problem List   Past Medical History  Diagnosis Date  . Hypertension   . Hyperlipidemia   . ESRD (end stage renal disease)     a. MWF dialysis in [place] (followed by Dr. Befekadu)  . Arthritis   . Claudication   . Stroke 2005  . Anemia   . Chronic diastolic CHF (congestive heart failure) 07/2010    a. 07/2008 Echo EF 50-55%, mild-mod LVH, Gr 1 DD.  . Dyspnea on exertion     a. 07/2011 Myoview: Inf infarct with peri-infarct ischemia, ef 44% (prelim reading)  . Peripheral vascular occlusive disease     a. 07/2011 Periph Angio: No signif Ao-illiac dzs, LCF stenosis, 100% LSFA w recon above knee pop and 3 vessel runoff, 100% RSFA w/ recon above knee --> pending L Fem to below Knee pop bypass.  . History of tobacco abuse    Past Surgical History  Procedure Date  . Arteriovenous graft placement   . Av fistula placement 12/10/2000    Right brachiocephalic arteriovenous   . Hernia repair     Allergies  No Known Allergies  HPI  61-year-old male with the above problem list.  He has a history of lower extremity claudication as well as poorly healing ingrown toenail.  He was recently evaluated by vascular surgery and underwent peripheral angiography revealing severe bilateral lower extremity disease and it is felt that he will require left lower extremity bypass as outlined above.  He was seen by our team in the hospital for preoperative clearance with recommendation for outpatient stress testing, which was performed this morning.  Preliminary review of the study by Dr. Katz shows inferior fixed defect in with peri-infarct ischemia.  Patient has never undergone cardiac  catheterization.  He denies any history of chest pain however experiences dyspnea on exertion after walking only about 100 to 150 feet.    Home Medications  Prior to Admission medications   Medication Sig Start Date End Date Taking? Authorizing Provider  amLODipine (NORVASC) 10 MG tablet Take 10 mg by mouth daily.   Yes Historical Provider, MD  Aspirin-Salicylamide-Caffeine (BC HEADACHE PO) Take 2 packets by mouth 2 (two) times daily as needed. For pain   Yes Historical Provider, MD  calcium acetate (PHOSLO) 667 MG capsule Take 667 mg by mouth See admin instructions. Take 2 tablets with meals and 1 tablet with snacks   Yes Historical Provider, MD  cinacalcet (SENSIPAR) 30 MG tablet Take 30 mg by mouth at bedtime.   Yes Historical Provider, MD  gabapentin (NEURONTIN) 100 MG capsule Take 100 mg by mouth 2 (two) times daily.   Yes Historical Provider, MD  Lanthanum Carbonate (FOSRENOL PO) Take 1,000-2,000 mg by mouth See admin instructions. Take 2000mg with meals and 1000 mg with snacks   Yes Historical Provider, MD  lisinopril (PRINIVIL,ZESTRIL) 30 MG tablet Take 30 mg by mouth daily.   Yes Historical Provider, MD  metoprolol succinate (TOPROL-XL) 100 MG 24 hr tablet Take 100 mg by mouth daily. Take with or immediately following a meal.   Yes Historical Provider, MD  multivitamin (RENA-VIT) TABS tablet Take 1 tablet by mouth daily.   Yes Historical Provider, MD  omeprazole (PRILOSEC) 20   MG capsule Take 20 mg by mouth daily.   Yes Historical Provider, MD  sevelamer (RENVELA) 800 MG tablet Take 800 mg by mouth 3 (three) times daily with meals.   Yes Historical Provider, MD  aspirin EC 81 MG tablet Take 1 tablet (81 mg total) by mouth daily. 07/31/11 07/30/12  Roselie Cirigliano R Ahmoni Edge, NP    Family History  Family History  Problem Relation Age of Onset  . Cancer Mother   . Cancer Father     Social History  History   Social History  . Marital Status: Married    Spouse Name: N/A    Number of  Children: N/A  . Years of Education: N/A   Occupational History  . Not on file.   Social History Main Topics  . Smoking status: Former Smoker -- 1.0 packs/day for 40 years    Types: Cigarettes    Quit date: 03/25/2011  . Smokeless tobacco: Not on file  . Alcohol Use: No  . Drug Use: No  . Sexually Active:    Other Topics Concern  . Not on file   Social History Narrative   Lives in [place].  His cousin helps him out and drives him to appts. He quit smoking earlier this year.     Review of Systems General:  No chills, fever, night sweats or weight changes.  Cardiovascular:  No chest pain, + dyspnea on exertion.  No edema, orthopnea, palpitations, paroxysmal nocturnal dyspnea.  He has bilat LE claudication. Dermatological: No rash, lesions/masses Respiratory: No cough, dyspnea Urologic: No hematuria, dysuria Abdominal:   No nausea, vomiting, diarrhea, bright red blood per rectum, melena, or hematemesis Neurologic:  No visual changes, wkns, changes in mental status. All other systems reviewed and are otherwise negative except as noted above.  Physical Exam  Blood pressure 155/80, pulse 60, resp. rate 20, height 5' 6" (1.676 m), weight 144 lb (65.318 kg).  General: Pleasant, NAD Psych: Normal affect. Neuro: Alert and oriented X 3. Moves all extremities spontaneously. HEENT: Normal  Neck: Supple without JVD.  He has a fairly loud right carotid bruit and a softer left carotid bruit. Lungs:  Resp regular and unlabored, diminished breath sounds right base otw CTA. Heart: RRR.  2/6 syst murmur heard throughout. Abdomen: Soft, non-tender, non-distended, BS + x 4.  Extremities: No clubbing, cyanosis or edema. R radial was 1+ at best.  Unable to easily palpate dp/pt's.  No abdominal or femoral bruits noted.  Accessory Clinical Findings  ECG - rsr, 1st deg avb, lvh  Assessment & Plan  1.  Dyspnea with abnormal stress test:  Patient presented for preoperative clearance and  underwent stress testing this morning which suggests prior inferior infarct and peri-infarct ischemia.  He has never had coronary angiography in the past.  After discussion with Dr. Katz and Dr. Hochrein, we have arranged for an outpatient cardiac catheterization tomorrow at [facility].  Tomorrow is her usual dialysis day for Mr. Siefring and he will undergo dialysis in the morning and presented to short stay for diagnostic cath.  We will obtain labs and chest x-ray today.  I have added aspirin 81 mg daily to his current regimen.  2.  Peripheral vascular disease:  As above, patient will require diagnostic cardiac catheterization prior to proceeding with lower extremity bypass surgery.  3.  Hypertension:  Continue home medications.  Blood pressure is elevated today.  4.  Carotid bruits:  I see he is already scheduled for carotid Dopplers on May 14.    5.  End stage renal disease: Continue Monday Wednesday Friday dialysis.  6.  Disposition: Followup with Dr. Nishan in approximately 2-3 weeks following catheterization.  Alfreddie Consalvo, NP 07/31/2011, 1:03 PM   

## 2011-08-01 NOTE — Discharge Instructions (Signed)

## 2011-08-01 NOTE — CV Procedure (Signed)
  Cardiac Catheterization Procedure Note  Name: NIKO PENSON MRN: [id number] DOB: 1951-02-19  Procedure: Left Heart Cath, Selective Coronary Angiography, LV angiography  Indication:   Abnormal stress perfusion study.  Inferior infarct/ischemia  Procedural details: The right groin was prepped, draped, and anesthetized with 1% lidocaine. Using modified Seldinger technique, a 5 French sheath was introduced into the right femoral artery. Standard Judkins catheters were used for coronary angiography and left ventriculography. Catheter exchanges were performed over a guidewire. There were no immediate procedural complications. The patient was transferred to the post catheterization recovery area for further monitoring.  Procedural Findings:   Hemodynamics:     AO 162/64     LV 169/21   Coronary angiography:  Coronary dominance: Right  Left mainstem:   Heavily calcified.  Distal 50%.   Left anterior descending (LAD):   Heavy calcification.  Ostial 50%.  Large vessel wrapping the apex.   D1 moderate and normal.   Left circumflex (LCx):  Large vessel.  AV groove normal.  OM1 large and luminal irregularities.  OM2 moderate and normal.  PL large and branching and normal.  Right coronary artery (RCA):  Dominant.  Long subtotal chronic occlusion with TIMI 1 flow.  Distal vessel with scant left to right collaterals.  Left ventriculography: Left ventricular systolic function is decreased, LVEF is estimated at 50 with moderate inferior akinesis, there is no significant mitral regurgitation   Final Conclusions:  Single vessel obstructive CAD.  Moderate nonobstructive plaque elsewhere.  Mild LV dysfunction.  Recommendations: Medical management and risk factor modification.  The patient will be at acceptable risk for the planned vascular surgery according to ACC/AHA guidelines.  Artavious Trebilcock 08/01/2011, 3:10 PM

## 2011-08-01 NOTE — Interval H&P Note (Signed)
History and Physical Interval Note:  08/01/2011 2:44 PM  [name]  has presented today for surgery, with the diagnosis of Chest pain  The various methods of treatment have been discussed with the patient and family. After consideration of risks, benefits and other options for treatment, the patient has consented to  Procedure(s) (LRB): LEFT HEART CATHETERIZATION WITH CORONARY ANGIOGRAM (N/A) as a surgical intervention .  The patients' history has been reviewed, patient examined, no change in status, stable for surgery.  I have reviewed the patients' chart and labs.  Questions were answered to the patient's satisfaction.     [name]

## 2011-08-04 ENCOUNTER — Other Ambulatory Visit (INDEPENDENT_AMBULATORY_CARE_PROVIDER_SITE_OTHER): Payer: Medicare Other | Admitting: *Deleted

## 2011-08-04 ENCOUNTER — Other Ambulatory Visit: Payer: Self-pay | Admitting: *Deleted

## 2011-08-04 ENCOUNTER — Encounter (INDEPENDENT_AMBULATORY_CARE_PROVIDER_SITE_OTHER): Payer: Medicare Other | Admitting: *Deleted

## 2011-08-04 ENCOUNTER — Encounter: Payer: Self-pay | Admitting: Surgery

## 2011-08-04 ENCOUNTER — Ambulatory Visit (INDEPENDENT_AMBULATORY_CARE_PROVIDER_SITE_OTHER): Payer: Medicare Other | Admitting: Surgery

## 2011-08-04 ENCOUNTER — Encounter: Payer: Self-pay | Admitting: *Deleted

## 2011-08-04 VITALS — BP 142/53 | HR 74 | Temp 98.6°F | Resp 16 | Ht 66.0 in | Wt 152.4 lb

## 2011-08-04 DIAGNOSIS — Z01810 Encounter for preprocedural cardiovascular examination: Secondary | ICD-10-CM

## 2011-08-04 DIAGNOSIS — J90 Pleural effusion, not elsewhere classified: Secondary | ICD-10-CM

## 2011-08-04 DIAGNOSIS — I6529 Occlusion and stenosis of unspecified carotid artery: Secondary | ICD-10-CM

## 2011-08-04 DIAGNOSIS — I739 Peripheral vascular disease, unspecified: Secondary | ICD-10-CM

## 2011-08-04 DIAGNOSIS — I70219 Atherosclerosis of native arteries of extremities with intermittent claudication, unspecified extremity: Secondary | ICD-10-CM

## 2011-08-04 DIAGNOSIS — N186 End stage renal disease: Secondary | ICD-10-CM

## 2011-08-04 NOTE — Progress Notes (Signed)
[facility]   Patient name: [name] MRN: [id number] DOB: 1951/02/08 Sex: male     Chief Complaint  Patient presents with  . PVD    Follow up  . Carotid    lab work  . ESRD    HD Monday-Wednesday-Friday    HISTORY OF PRESENT ILLNESS: The patient comes back today for followup of his left great toe ulcer. Angiography revealed him to have an occluded left superficial femoral artery an extensive common femoral disease. He was sent for cardiac clearance. He is undergone cardiac angiography and deemed to be acceptable risk for bypass surgery. The patient has had no changes in his symptoms.  Past Medical History  Diagnosis Date  . Hypertension   . Hyperlipidemia   . ESRD (end stage renal disease)     a. MWF dialysis in [place] (followed by Dr. Kristian Covey)  . Arthritis   . Claudication   . Stroke 2005  . Anemia   . Chronic diastolic CHF (congestive heart failure) 07/2010    a. 07/2008 Echo EF 50-55%, mild-mod LVH, Gr 1 DD.  Marland Kitchen Dyspnea on exertion     a. 07/2011 Myoview: Inf infarct with peri-infarct ischemia, ef 44% (prelim reading)  . Peripheral vascular occlusive disease     a. 07/2011 Periph Angio: No signif Ao-illiac dzs, LCF stenosis, 100% LSFA w recon above knee pop and 3 vessel runoff, 100% RSFA w/ recon above knee --> pending L Fem to below Knee pop bypass.  Marland Kitchen History of tobacco abuse     Past Surgical History  Procedure Date  . Arteriovenous graft placement   . Av fistula placement 12/10/2000    Right brachiocephalic arteriovenous   . Hernia repair   . Aortagram 07/29/2011    Abdominal Aortagram  . Cardiac catheterization 08/01/11    Left heart catheterization    History   Social History  . Marital Status: Married    Spouse Name: N/A    Number of Children: N/A  . Years of Education: N/A   Occupational History  . Not on file.   Social History Main Topics  . Smoking status: Former Smoker -- 1.0 packs/day for 40 years    Types:  Cigarettes    Quit date: 03/25/2011  . Smokeless tobacco: Not on file  . Alcohol Use: No  . Drug Use: No  . Sexually Active:    Other Topics Concern  . Not on file   Social History Narrative   Lives in [place].  His cousin helps him out and drives him to appts. He quit smoking earlier this year.    Family History  Problem Relation Age of Onset  . Cancer Mother   . Cancer Father     Allergies as of 08/04/2011  . (No Known Allergies)    Current Outpatient Prescriptions on File Prior to Visit  Medication Sig Dispense Refill  . amLODipine (NORVASC) 10 MG tablet Take 10 mg by mouth daily.      Marland Kitchen aspirin EC 81 MG tablet Take 1 tablet (81 mg total) by mouth daily.  90 tablet  3  . Aspirin-Salicylamide-Caffeine (BC HEADACHE PO) Take 2 packets by mouth 2 (two) times daily as needed. For pain      . calcium acetate (PHOSLO) 667 MG capsule Take 667 mg by mouth See admin instructions. Take 2 tablets with meals and 1 tablet with snacks      . cinacalcet (SENSIPAR) 30 MG tablet Take 30 mg by mouth  at bedtime.      . gabapentin (NEURONTIN) 100 MG capsule Take 100 mg by mouth 2 (two) times daily.      . Lanthanum Carbonate (FOSRENOL PO) Take 1,000-2,000 mg by mouth See admin instructions. Take 2000mg  with meals and 1000 mg with snacks      . lisinopril (PRINIVIL,ZESTRIL) 30 MG tablet Take 30 mg by mouth daily.      . metoprolol succinate (TOPROL-XL) 100 MG 24 hr tablet Take 100 mg by mouth daily. Take with or immediately following a meal.      . multivitamin (RENA-VIT) TABS tablet Take 1 tablet by mouth daily.      Marland Kitchen omeprazole (PRILOSEC) 20 MG capsule Take 20 mg by mouth daily.      . sevelamer (RENVELA) 800 MG tablet Take 800 mg by mouth 3 (three) times daily with meals.         REVIEW OF SYSTEMS: No change from prior visit  PHYSICAL EXAMINATION:   Vital signs are BP 142/53  Pulse 74  Temp(Src) 98.6 F (37 C) (Oral)  Resp 16  Ht 5\' 6"  (1.676 m)  Wt 152 lb 6.4 oz (69.128 kg)  BMI  24.60 kg/m2  SpO2 99% General: The patient appears their stated age. HEENT:  No gross abnormalities Pulmonary:  Non labored breathing Abdomen: Soft and non-tender Musculoskeletal: There are no major deformities. Neurologic: No focal weakness or paresthesias are detected, Skin: There are no ulcer or rashes noted. The left great toe is edematous Psychiatric: The patient has normal affect. Cardiovascular: There is a regular rate and rhythm without significant murmur appreciated. Pedal pulses are not palpable   Diagnostic Studies Carotid ultrasound: No significant carotid stenosis Vein mapping: The left great saphenous vein ranges from 0.27 in the upper calf to 0.42 and the groin.  Assessment: Left great toe ulcer Plan: I believe the patient needs revascularization in order to achieve limb salvage do to the left great toe ulcer and ischemia. He is not a percutaneous candidate due to his common femoral disease. Vein mapping today reveals what I believe be an acceptable greater saphenous vein. I have recommended a left common femoral to below knee popliteal artery bypass graft with vein. He may require a femoral endarterectomy. I discussed the risks and benefits with the patient and his wife. These include the risk of infection, the risk of cardiopulmonary complications, bypass graft patency, and the recovery period. All his questions were answered today. His operation is been scheduled for Friday, May 24  V. Charlena Cross, M.D. [facility] Office: [phone] Pager:  [phone]

## 2011-08-05 ENCOUNTER — Ambulatory Visit (INDEPENDENT_AMBULATORY_CARE_PROVIDER_SITE_OTHER): Payer: Medicare Other | Admitting: Pulmonary Disease

## 2011-08-05 ENCOUNTER — Encounter: Payer: Self-pay | Admitting: Pulmonary Disease

## 2011-08-05 VITALS — BP 150/92 | HR 68 | Temp 98.5°F | Ht 66.0 in | Wt 157.0 lb

## 2011-08-05 DIAGNOSIS — J90 Pleural effusion, not elsewhere classified: Secondary | ICD-10-CM | POA: Insufficient documentation

## 2011-08-05 MED ORDER — PREDNISONE 10 MG PO TABS: ORAL_TABLET | ORAL | Status: DC

## 2011-08-05 NOTE — Progress Notes (Signed)
  Subjective:    Patient ID: [name], male    DOB: [birth year], 61 y.o.   MRN: [id number]  HPI The patient is a 61 year old male who I've been asked to see for a right pleural effusion.  The patient was recently noted to have on chest x-ray a small right pleural effusion tracking into the major fissure.  His last x-ray prior to this one was in June of 2012, and there was no effusion present at that time.  It should be noted the patient has underlying congestive heart failure and chronic renal failure that requires hemodialysis.  The patient has known dyspnea on exertion, but feels there has been no recent worsening of his symptoms.  He denies any right-sided chest pain, and has had no significant increase in lower extremity edema.  He denies any recent chest infection, and has no chest congestion or purulent mucus.  He states that his weights have been decreasing at hemodialysis.  He has a history of a negative PPD approximately one year ago.  He has no prior history of pleural effusions to his knowledge.   Review of Systems  Constitutional: Positive for unexpected weight change. Negative for fever.  HENT: Negative.  Negative for ear pain, nosebleeds, congestion, sore throat, rhinorrhea, sneezing, trouble swallowing, dental problem, postnasal drip and sinus pressure.   Eyes: Negative.  Negative for redness and itching.  Respiratory: Positive for shortness of breath. Negative for cough, chest tightness and wheezing.   Cardiovascular: Negative.  Negative for palpitations and leg swelling.  Gastrointestinal: Negative.  Negative for nausea and vomiting.  Genitourinary: Negative.  Negative for dysuria.  Musculoskeletal: Positive for arthralgias. Negative for joint swelling.  Skin: Negative.  Negative for rash.  Neurological: Negative.  Negative for headaches.  Hematological: Negative.  Does not bruise/bleed easily.  Psychiatric/Behavioral: Negative.  Negative for dysphoric mood. The patient is  not nervous/anxious.        Objective:   Physical Exam Constitutional: thin male, no acute distress  HENT:  Nares patent without discharge  Oropharynx without exudate, palate and uvula are normal  Eyes:  Perrla, eomi, no scleral icterus  Neck:  No JVD, no TMG  Cardiovascular:  Normal rate, regular rhythm, no rubs or gallops.  3/6 sem        Decreased distal pulses  Pulmonary :  Decreased bs right base, no stridor or respiratory distress   no rhonchi, or wheezing.  +crackles both bases.  Abdominal:  Soft, nondistended, bowel sounds present.  No tenderness noted.   Musculoskeletal:  mild lower extremity edema noted.  Lymph Nodes:  No cervical lymphadenopathy noted  Skin:  No cyanosis noted  Neurologic:  Alert, appropriate, moves all 4 extremities without obvious deficit.         Assessment & Plan:

## 2011-08-05 NOTE — Patient Instructions (Signed)
Will set up for drainage of fluid off right chest.  I will call you with results.

## 2011-08-05 NOTE — Assessment & Plan Note (Signed)
The patient has a right pleural effusion of unknown chronicity, and more than likely this is related to his congestive heart failure or his known chronic renal failure.  He denies any history to suggest thromboembolic disease or a pulmonary infection.  His effusion is on the small side, but I think it would be worth doing a thoracentesis to see if it is a transudate or exudate, and to see if there are any specific defining characteristics.  The patient is agreeable to this approach.

## 2011-08-06 ENCOUNTER — Encounter (HOSPITAL_COMMUNITY): Payer: Self-pay | Admitting: Pharmacy Technician

## 2011-08-06 ENCOUNTER — Encounter: Payer: Medicare Other | Admitting: Vascular Surgery

## 2011-08-08 ENCOUNTER — Encounter (HOSPITAL_COMMUNITY): Payer: Self-pay | Admitting: Pharmacy Technician

## 2011-08-11 ENCOUNTER — Encounter (HOSPITAL_COMMUNITY): Payer: Self-pay

## 2011-08-11 ENCOUNTER — Inpatient Hospital Stay (HOSPITAL_COMMUNITY): Admission: RE | Admit: 2011-08-11 | Discharge: 2011-08-11 | Payer: Medicare Other | Source: Ambulatory Visit

## 2011-08-11 NOTE — Progress Notes (Signed)
Confirmed lab appointment for 08/03/11 at 0840

## 2011-08-11 NOTE — Pre-Procedure Instructions (Signed)
[address] [facility]  08/11/2011   Your procedure is scheduled on:  Fri, May 24 @ 7:30 AM  Report to [facility] at 5:30 AM.  Call this number if you have problems the morning of surgery: [phone]   Remember:   Do not eat food:After Midnight.  May have clear liquids: up to 4 Hours before arrival.(until 1:30 AM)  Clear liquids include soda, tea, black coffee, apple or grape juice, broth,water  Take these medicines the morning of surgery with A SIP OF WATER: Amlodipine,Metoprolol,Omeprazole,and Gabapentin   Do not wear jewelry  Do not wear lotions, powders, or cologne.   Men may shave face and neck.  Do not bring valuables to the hospital.  Contacts, dentures or bridgework may not be worn into surgery.  Leave suitcase in the car. After surgery it may be brought to your room.  For patients admitted to the hospital, checkout time is 11:00 AM the day of discharge.   Special Instructions: CHG Shower Use Special Wash: 1/2 bottle night before surgery and 1/2 bottle morning of surgery.   Please read over the following fact sheets that you were given: Pain Booklet, Coughing and Deep Breathing, Blood Transfusion Information, MRSA Information and Surgical Site Infection Prevention

## 2011-08-11 NOTE — Progress Notes (Addendum)
Dr.Nishan is cardiologist with last visit on 07/29/11-results in epic  Stress test in epic from 07/31/11 Echo in epic from 07/2008 Heart cath in epic from 08/01/11  Sees Dr.Befakadu-urologist in [place]

## 2011-08-11 NOTE — Procedures (Unsigned)
VASCULAR LAB EXAM  INDICATION:  Preoperative lower extremity vein mapping.  HISTORY: Diabetes:  No. Cardiac:  Unknown. Hypertension:  Yes.  EXAM:  The bilateral lower extremity great saphenous veins were evaluated and appear patent and compressible.  IMPRESSION:  Patent bilateral great saphenous veins with diameter measurements shown on the following worksheet.  ___________________________________________ V. Charlena Cross, MD  EM/MEDQ  D:  08/05/2011  T:  08/05/2011  Job:  [id number]

## 2011-08-11 NOTE — Procedures (Unsigned)
CAROTID DUPLEX EXAM  INDICATION:  Preoperative exam for possible lower extremity bypass.  HISTORY: Diabetes:  No. Cardiac:  Unknown. Hypertension:  Yes. Smoking:  Yes. Previous Surgery:  No carotid intervention. CV History:  Currently asymptomatic. Amaurosis Fugax No, Paresthesias No, Hemiparesis No                                      RIGHT             LEFT Brachial systolic pressure: Brachial Doppler waveforms: Vertebral direction of flow:        Antegrade         Antegrade DUPLEX VELOCITIES (cm/sec) CCA peak systolic                   50                58 ECA peak systolic                   54                124 ICA peak systolic                   67                78 ICA end diastolic                   19                20 PLAQUE MORPHOLOGY:                  Mixed             Mixed PLAQUE AMOUNT:                      Minimal           Minimal PLAQUE LOCATION:                    CCA, ICA, ECA     CCA, ICA, ECA  IMPRESSION: 1. No hemodynamically significant stenosis of the right or left     internal carotid artery, with plaque formations as mentioned above. 2. Left external carotid artery stenosis. 3. Bilateral antegrade vertebral arteries.  ___________________________________________ V. Charlena Cross, MD  EM/MEDQ  D:  08/05/2011  T:  08/05/2011  Job:  [id number]

## 2011-08-12 ENCOUNTER — Encounter: Payer: Medicare Other | Admitting: Vascular Surgery

## 2011-08-12 ENCOUNTER — Inpatient Hospital Stay (HOSPITAL_COMMUNITY): Admission: RE | Admit: 2011-08-12 | Payer: Medicare Other | Source: Ambulatory Visit

## 2011-08-13 ENCOUNTER — Inpatient Hospital Stay (HOSPITAL_COMMUNITY): Payer: Medicare Other

## 2011-08-13 ENCOUNTER — Inpatient Hospital Stay (HOSPITAL_COMMUNITY)
Admission: EM | Admit: 2011-08-13 | Discharge: 2011-08-23 | DRG: 853 | Disposition: A | Discharge status: Home Health | Payer: Medicare Other | Attending: Internal Medicine | Admitting: Internal Medicine

## 2011-08-13 ENCOUNTER — Emergency Department (HOSPITAL_COMMUNITY): Payer: Medicare Other

## 2011-08-13 ENCOUNTER — Encounter (HOSPITAL_COMMUNITY): Payer: Self-pay | Admitting: Neurology

## 2011-08-13 ENCOUNTER — Other Ambulatory Visit: Payer: Self-pay

## 2011-08-13 DIAGNOSIS — Y832 Surgical operation with anastomosis, bypass or graft as the cause of abnormal reaction of the patient, or of later complication, without mention of misadventure at the time of the procedure: Secondary | ICD-10-CM | POA: Diagnosis not present

## 2011-08-13 DIAGNOSIS — I4891 Unspecified atrial fibrillation: Secondary | ICD-10-CM

## 2011-08-13 DIAGNOSIS — J189 Pneumonia, unspecified organism: Secondary | ICD-10-CM

## 2011-08-13 DIAGNOSIS — R6520 Severe sepsis without septic shock: Secondary | ICD-10-CM | POA: Diagnosis present

## 2011-08-13 DIAGNOSIS — D72829 Elevated white blood cell count, unspecified: Secondary | ICD-10-CM | POA: Diagnosis present

## 2011-08-13 DIAGNOSIS — K56609 Unspecified intestinal obstruction, unspecified as to partial versus complete obstruction: Secondary | ICD-10-CM

## 2011-08-13 DIAGNOSIS — Z79899 Other long term (current) drug therapy: Secondary | ICD-10-CM

## 2011-08-13 DIAGNOSIS — J9600 Acute respiratory failure, unspecified whether with hypoxia or hypercapnia: Secondary | ICD-10-CM | POA: Diagnosis not present

## 2011-08-13 DIAGNOSIS — N2581 Secondary hyperparathyroidism of renal origin: Secondary | ICD-10-CM | POA: Diagnosis present

## 2011-08-13 DIAGNOSIS — A419 Sepsis, unspecified organism: Principal | ICD-10-CM | POA: Diagnosis present

## 2011-08-13 DIAGNOSIS — I2510 Atherosclerotic heart disease of native coronary artery without angina pectoris: Secondary | ICD-10-CM

## 2011-08-13 DIAGNOSIS — F329 Major depressive disorder, single episode, unspecified: Secondary | ICD-10-CM | POA: Diagnosis not present

## 2011-08-13 DIAGNOSIS — D631 Anemia in chronic kidney disease: Secondary | ICD-10-CM | POA: Diagnosis present

## 2011-08-13 DIAGNOSIS — E861 Hypovolemia: Secondary | ICD-10-CM | POA: Diagnosis present

## 2011-08-13 DIAGNOSIS — Z87891 Personal history of nicotine dependence: Secondary | ICD-10-CM

## 2011-08-13 DIAGNOSIS — Z7982 Long term (current) use of aspirin: Secondary | ICD-10-CM

## 2011-08-13 DIAGNOSIS — M949 Disorder of cartilage, unspecified: Secondary | ICD-10-CM | POA: Diagnosis present

## 2011-08-13 DIAGNOSIS — G9341 Metabolic encephalopathy: Secondary | ICD-10-CM | POA: Diagnosis present

## 2011-08-13 DIAGNOSIS — I70219 Atherosclerosis of native arteries of extremities with intermittent claudication, unspecified extremity: Secondary | ICD-10-CM

## 2011-08-13 DIAGNOSIS — R7309 Other abnormal glucose: Secondary | ICD-10-CM | POA: Diagnosis not present

## 2011-08-13 DIAGNOSIS — R6521 Severe sepsis with septic shock: Secondary | ICD-10-CM | POA: Diagnosis present

## 2011-08-13 DIAGNOSIS — J90 Pleural effusion, not elsewhere classified: Secondary | ICD-10-CM

## 2011-08-13 DIAGNOSIS — I4892 Unspecified atrial flutter: Secondary | ICD-10-CM

## 2011-08-13 DIAGNOSIS — R140 Abdominal distension (gaseous): Secondary | ICD-10-CM | POA: Diagnosis present

## 2011-08-13 DIAGNOSIS — I5032 Chronic diastolic (congestive) heart failure: Secondary | ICD-10-CM

## 2011-08-13 DIAGNOSIS — I10 Essential (primary) hypertension: Secondary | ICD-10-CM | POA: Diagnosis present

## 2011-08-13 DIAGNOSIS — N186 End stage renal disease: Secondary | ICD-10-CM

## 2011-08-13 DIAGNOSIS — I509 Heart failure, unspecified: Secondary | ICD-10-CM | POA: Diagnosis present

## 2011-08-13 DIAGNOSIS — I120 Hypertensive chronic kidney disease with stage 5 chronic kidney disease or end stage renal disease: Secondary | ICD-10-CM | POA: Diagnosis present

## 2011-08-13 DIAGNOSIS — R5381 Other malaise: Secondary | ICD-10-CM | POA: Diagnosis not present

## 2011-08-13 DIAGNOSIS — I739 Peripheral vascular disease, unspecified: Secondary | ICD-10-CM

## 2011-08-13 DIAGNOSIS — Z8673 Personal history of transient ischemic attack (TIA), and cerebral infarction without residual deficits: Secondary | ICD-10-CM

## 2011-08-13 DIAGNOSIS — E785 Hyperlipidemia, unspecified: Secondary | ICD-10-CM | POA: Diagnosis present

## 2011-08-13 DIAGNOSIS — Z992 Dependence on renal dialysis: Secondary | ICD-10-CM

## 2011-08-13 DIAGNOSIS — M899 Disorder of bone, unspecified: Secondary | ICD-10-CM | POA: Diagnosis present

## 2011-08-13 DIAGNOSIS — A0472 Enterocolitis due to Clostridium difficile, not specified as recurrent: Secondary | ICD-10-CM

## 2011-08-13 DIAGNOSIS — M129 Arthropathy, unspecified: Secondary | ICD-10-CM | POA: Diagnosis present

## 2011-08-13 DIAGNOSIS — F3289 Other specified depressive episodes: Secondary | ICD-10-CM | POA: Diagnosis not present

## 2011-08-13 DIAGNOSIS — T82898A Other specified complication of vascular prosthetic devices, implants and grafts, initial encounter: Secondary | ICD-10-CM | POA: Diagnosis not present

## 2011-08-13 DIAGNOSIS — R45851 Suicidal ideations: Secondary | ICD-10-CM

## 2011-08-13 LAB — DIFFERENTIAL
Basophils Absolute: 0.1 K/uL (ref 0.0–0.1)
Basophils Relative: 1 % (ref 0–1)
Eosinophils Absolute: 0 K/uL (ref 0.0–0.7)
Eosinophils Relative: 0 % (ref 0–5)
Lymphocytes Relative: 10 % — ABNORMAL LOW (ref 12–46)
Lymphs Abs: 1.3 K/uL (ref 0.7–4.0)
Monocytes Absolute: 0.9 K/uL (ref 0.1–1.0)
Monocytes Relative: 7 % (ref 3–12)
Neutro Abs: 10.4 K/uL — ABNORMAL HIGH (ref 1.7–7.7)
Neutrophils Relative %: 82 % — ABNORMAL HIGH (ref 43–77)
WBC Morphology: INCREASED

## 2011-08-13 LAB — POCT I-STAT, CHEM 8
BUN: 76 mg/dL — ABNORMAL HIGH (ref 6–23)
HCT: 30 % — ABNORMAL LOW (ref 39.0–52.0)
Hemoglobin: 10.2 g/dL — ABNORMAL LOW (ref 13.0–17.0)
Sodium: 141 mEq/L (ref 135–145)
TCO2: 25 mmol/L (ref 0–100)

## 2011-08-13 LAB — COMPREHENSIVE METABOLIC PANEL
ALT: 12 U/L (ref 0–53)
Calcium: 9.3 mg/dL (ref 8.4–10.5)
GFR calc Af Amer: 6 mL/min — ABNORMAL LOW (ref >90)
Glucose, Bld: 112 mg/dL — ABNORMAL HIGH (ref 70–99)
Sodium: 144 mEq/L (ref 135–145)
Total Protein: 5.9 g/dL — ABNORMAL LOW (ref 6.0–8.3)

## 2011-08-13 LAB — CBC
Hemoglobin: 11.4 g/dL — ABNORMAL LOW (ref 13.0–17.0)
RBC: 4.03 MIL/uL — ABNORMAL LOW (ref 4.22–5.81)

## 2011-08-13 LAB — TYPE AND SCREEN
ABO/RH(D): O POS
Antibody Screen: NEGATIVE

## 2011-08-13 LAB — POCT I-STAT TROPONIN I: Troponin i, poc: 0.09 ng/mL (ref 0.00–0.08)

## 2011-08-13 LAB — CARDIAC PANEL(CRET KIN+CKTOT+MB+TROPI): CK, MB: 5.7 ng/mL — ABNORMAL HIGH (ref 0.3–4.0)

## 2011-08-13 LAB — LACTIC ACID, PLASMA: Lactic Acid, Venous: 4.7 mmol/L — ABNORMAL HIGH (ref 0.5–2.2)

## 2011-08-13 MED ORDER — SODIUM CHLORIDE 0.9 % IV BOLUS (SEPSIS)
1000.0000 mL | Freq: Once | INTRAVENOUS | Status: AC
  Administered 2011-08-13: 1000 mL via INTRAVENOUS

## 2011-08-13 MED ORDER — VASOPRESSIN 20 UNIT/ML IJ SOLN
0.0300 [IU]/min | INTRAVENOUS | Status: DC
  Administered 2011-08-13: 0.03 [IU]/min via INTRAVENOUS
  Filled 2011-08-13 (×2): qty 2.5

## 2011-08-13 MED ORDER — SODIUM CHLORIDE 0.9 % IV SOLN: 250.0000 mL | INTRAVENOUS | Status: DC | PRN

## 2011-08-13 MED ORDER — SODIUM CHLORIDE 0.9 % IV SOLN
INTRAVENOUS | Status: DC
  Administered 2011-08-14: 20 mL/h via INTRAVENOUS

## 2011-08-13 MED ORDER — NOREPINEPHRINE BITARTRATE 1 MG/ML IJ SOLN
2.0000 ug/min | Freq: Once | INTRAVENOUS | Status: AC
  Administered 2011-08-13: 20 ug/min via INTRAVENOUS
  Administered 2011-08-13: 35 ug/min via INTRAVENOUS
  Filled 2011-08-13: qty 4

## 2011-08-13 MED ORDER — DEXTROSE 5 % IV SOLN
1.0000 g | INTRAVENOUS | Status: DC
  Administered 2011-08-14 – 2011-08-22 (×9): 1 g via INTRAVENOUS
  Filled 2011-08-13 (×13): qty 1

## 2011-08-13 MED ORDER — SODIUM CHLORIDE 0.9 % IV BOLUS (SEPSIS): 25.0000 mL/kg | Freq: Once | INTRAVENOUS | Status: DC

## 2011-08-13 MED ORDER — VANCOMYCIN HCL IN DEXTROSE 1-5 GM/200ML-% IV SOLN: 1000.0000 mg | Freq: Once | INTRAVENOUS | Status: DC

## 2011-08-13 MED ORDER — PHENYLEPHRINE HCL 10 MG/ML IJ SOLN
30.0000 ug/min | INTRAMUSCULAR | Status: DC
  Administered 2011-08-13 (×2): 200 ug/min via INTRAVENOUS
  Filled 2011-08-13 (×5): qty 1

## 2011-08-13 MED ORDER — NOREPINEPHRINE BITARTRATE 1 MG/ML IJ SOLN
INTRAMUSCULAR | Status: AC
  Administered 2011-08-13: 4 mg
  Filled 2011-08-13: qty 4

## 2011-08-13 MED ORDER — IOHEXOL 300 MG/ML  SOLN: 20.0000 mL | INTRAMUSCULAR | Status: AC

## 2011-08-13 MED ORDER — METRONIDAZOLE IN NACL 5-0.79 MG/ML-% IV SOLN
500.0000 mg | Freq: Three times a day (TID) | INTRAVENOUS | Status: DC
  Administered 2011-08-14 – 2011-08-16 (×7): 500 mg via INTRAVENOUS
  Filled 2011-08-13 (×9): qty 100

## 2011-08-13 MED ORDER — FAMOTIDINE IN NACL 20-0.9 MG/50ML-% IV SOLN
20.0000 mg | Freq: Two times a day (BID) | INTRAVENOUS | Status: DC
  Administered 2011-08-13 – 2011-08-15 (×5): 20 mg via INTRAVENOUS
  Filled 2011-08-13 (×8): qty 50

## 2011-08-13 MED ORDER — METRONIDAZOLE IN NACL 5-0.79 MG/ML-% IV SOLN
500.0000 mg | Freq: Once | INTRAVENOUS | Status: AC
  Administered 2011-08-13: 500 mg via INTRAVENOUS
  Filled 2011-08-13: qty 100

## 2011-08-13 MED ORDER — HYDROCORTISONE SOD SUCCINATE 100 MG PF FOR IT USE
50.0000 mg | Freq: Four times a day (QID) | INTRAMUSCULAR | Status: DC
  Filled 2011-08-13 (×5): qty 0.5

## 2011-08-13 MED ORDER — VANCOMYCIN HCL 500 MG IV SOLR
500.0000 mg | INTRAVENOUS | Status: AC
  Filled 2011-08-13: qty 500

## 2011-08-13 MED ORDER — PIPERACILLIN-TAZOBACTAM 3.375 G IVPB
3.3750 g | Freq: Once | INTRAVENOUS | Status: AC
  Administered 2011-08-13: 3.375 g via INTRAVENOUS
  Filled 2011-08-13: qty 50

## 2011-08-13 MED ORDER — DEXTROSE 5 % IV SOLN: 2.0000 g | Freq: Once | INTRAVENOUS | Status: DC

## 2011-08-13 MED ORDER — DEXTROSE 5 % IV SOLN
INTRAVENOUS | Status: AC
  Filled 2011-08-13: qty 250

## 2011-08-13 MED ORDER — VANCOMYCIN HCL IN DEXTROSE 1-5 GM/200ML-% IV SOLN
1000.0000 mg | Freq: Once | INTRAVENOUS | Status: AC
  Administered 2011-08-13: 1000 mg via INTRAVENOUS
  Filled 2011-08-13: qty 200

## 2011-08-13 MED ORDER — SODIUM CHLORIDE 0.9 % IV SOLN: INTRAVENOUS | Status: DC

## 2011-08-13 MED ORDER — NOREPINEPHRINE BITARTRATE 1 MG/ML IJ SOLN
INTRAMUSCULAR | Status: AC
  Filled 2011-08-13: qty 4

## 2011-08-13 NOTE — Procedures (Signed)
Arterial Line Insertion Procedure Note [name] [id number] [birth year]  Procedure: Insertion of Central Venous Catheter Indications: blood pressure monitoring  Procedure Details Consent: Unable to obtain consent because of altered level of consciousness. Time Out: Verified patient identification, verified procedure, site/side was marked, verified correct patient position, special equipment/implants available, medications/allergies/relevent history reviewed, required imaging and test results available.  Performed  Maximum sterile technique was used including antiseptics, cap, gloves, gown, hand hygiene, mask and sheet. Skin prep: Chlorhexidine; local anesthetic administered A antimicrobial bonded/coated single lumen catheter was placed in the right femoral artery under ultrasound guideance.  Initially the left femoral artery was attempted but the wire could not be passed.    Evaluation Blood flow good Complications: No apparent complications Patient did tolerate procedure well.   Criag Wicklund 08/13/2011, 8:37 PM

## 2011-08-13 NOTE — Progress Notes (Signed)
ANTIBIOTIC CONSULT NOTE - INITIAL  Pharmacy Consult for vancomycin, cefepime, flagyl Indication: Sepsis  No Known Allergies  Patient Measurements:     Vital Signs: Temp: 98.4 F (36.9 C) (05/22 1628) Temp src: Oral (05/22 1628) BP: 84/25 mmHg (05/22 1645) Pulse Rate: 131  (05/22 1638) Intake/Output from previous day:   Intake/Output from this shift:    Labs:  Basename 08/13/11 1611 08/13/11 1530 08/13/11 1427  WBC -- -- 12.7*  HGB 10.2* -- 11.4*  PLT -- -- PLATELET CLUMPS NOTED ON SMEAR, COUNT APPEARS ADEQUATE  LABCREA -- -- --  CREATININE 9.60* 8.91* --   The CrCl is unknown because both a height and weight (above a minimum accepted value) are required for this calculation. No results found for this basename: VANCOTROUGH:2,VANCOPEAK:2,VANCORANDOM:2,GENTTROUGH:2,GENTPEAK:2,GENTRANDOM:2,TOBRATROUGH:2,TOBRAPEAK:2,TOBRARND:2,AMIKACINPEAK:2,AMIKACINTROU:2,AMIKACIN:2, in the last 72 hours   Microbiology: No results found for this or any previous visit (from the past 720 hour(s)).  Medical History: Past Medical History  Diagnosis Date  . Hyperlipidemia   . ESRD (end stage renal disease)     a. MWF dialysis in [place] (followed by Dr. Kristian Covey)  . Arthritis   . Claudication   . Stroke 2005  . Anemia   . Chronic diastolic CHF (congestive heart failure) 07/2010    a. 07/2008 Echo EF 50-55%, mild-mod LVH, Gr 1 DD.  Marland Kitchen Peripheral vascular occlusive disease     a. 07/2011 Periph Angio: No signif Ao-illiac dzs, LCF stenosis, 100% LSFA w recon above knee pop and 3 vessel runoff, 100% RSFA w/ recon above knee --> pending L Fem to below Knee pop bypass.  Marland Kitchen History of tobacco abuse   . Hypertension     takes Amlodipine,Metoprolol,and Prinivil daily  . Dyspnea on exertion     with exertion    Assessment: Patient is a 61 y.o M with ERSD (on HD MWF) who was found at home lethargic. He was hypotensive on arrival to the ED. To start broad abx for sepsis. Patient received vancomycin  1gm and zosyn 3.375gm IV in the ED at 1600.  Goal of Therapy:  Pre-HD vancomycin level= 15-25  Plan:  1) will give an additional vancomycin 500mg  IV x1 now (to get 1500mg  total), then 750mg  IV after each HD session (will enter when patient has dialysis) 2) cefepime 1gm q24h 3) flagyl 500mg  IV q8h 4) f/u with plan for dialysis    Braden Deloach P 08/13/2011,5:15 PM

## 2011-08-13 NOTE — ED Provider Notes (Signed)
2:53 PM  Pt seen on arrival with resident He is for altered mental status, found at home covered in stool He is awake/alert, but hypotensive and tachycardic He moves all extremities He is a dialysis patient, unclear last dialysis Will require central access due to hypotension His abdomen is rigid and concern for acute abdomen is high  3:37 PM BP improved Awake/alert D/w dr Vassie Loll, PCCM, will initiate code sepsis IV antibiotics ordered I have also consulted surgery  3:50 PM Spoke to dr Daphine Deutscher, surgery He is aware of patient and likely acute abdomen ,given his xray He is aware critical care to see patient He requests CT imaging if BP has improved 3:57 PM Critical care at bedside, will admit patient Pt will need CT imaging Pt awake/alert at this time, does not require airway management currently Family at bedside   Date: 08/13/2011  Rate: 132  Rhythm: sinus tachycardia  QRS Axis: normal  Intervals: normal  ST/T Wave abnormalities: ST elevations diffusely  Conduction Disutrbances:none  Narrative Interpretation:   Old EKG Reviewed: changes noted  CRITICAL CARE Performed by: Joya Gaskins   Total critical care time: 45  Critical care time was exclusive of separately billable procedures and treating other patients.  Critical care was necessary to treat or prevent imminent or life-threatening deterioration.  Critical care was time spent personally by me on the following activities: development of treatment plan with patient and/or surrogate as well as nursing, discussions with consultants, evaluation of patient's response to treatment, examination of patient, obtaining history from patient or surrogate, ordering and performing treatments and interventions, ordering and review of laboratory studies, ordering and review of radiographic studies, pulse oximetry and re-evaluation of patient's condition.     Joya Gaskins, MD 08/13/11 [phone]

## 2011-08-13 NOTE — ED Notes (Signed)
Pt needing central line placed, unable to obtain consent due to pt being lethargic. Central line being placed emergently.

## 2011-08-13 NOTE — ED Notes (Signed)
BP noted to be 66/39, Levophed increased to 40 mcg, Admitting MD aware, reporting give 4th liter of fluid

## 2011-08-13 NOTE — ED Notes (Addendum)
Pt tachycardic 130's, hypotensive 40 systolic, pt covered in stool, alert, not with it enough to answer questuions. Denying any pain, abdomen distended, taut. Airway intact. Maintaining airway, respirations 20 unlabored

## 2011-08-13 NOTE — H&P (Signed)
Name: [name] MRN: [id number] DOB: October 23, 1950    LOS: 0  Referring Provider:  EDP Reason for Referral:  Septic shock   PULMONARY / CRITICAL CARE MEDICINE  HPI:  61yo male with hx ESRD (MWF HD), CVA, PVD, HTN, R pleural effusion who was found at home 5/22 lethargic, covered in stool and vomit.  He was last seen normal 5/20 when he went for HD.  Per family he was in his usual state of health prior to Monday.  On arrival to ER was hypotensive with SBP 40's and PCCM called to admit for septic shock.   Past Medical History  Diagnosis Date  . Hyperlipidemia   . ESRD (end stage renal disease)     a. MWF dialysis in [place] (followed by Dr. Kristian Covey)  . Arthritis   . Claudication   . Stroke 2005  . Anemia   . Chronic diastolic CHF (congestive heart failure) 07/2010    a. 07/2008 Echo EF 50-55%, mild-mod LVH, Gr 1 DD.  Marland Kitchen Peripheral vascular occlusive disease     a. 07/2011 Periph Angio: No signif Ao-illiac dzs, LCF stenosis, 100% LSFA w recon above knee pop and 3 vessel runoff, 100% RSFA w/ recon above knee --> pending L Fem to below Knee pop bypass.  Marland Kitchen History of tobacco abuse   . Hypertension     takes Amlodipine,Metoprolol,and Prinivil daily  . Dyspnea on exertion     with exertion   Past Surgical History  Procedure Date  . Arteriovenous graft placement   . Av fistula placement 12/10/2000    Right brachiocephalic arteriovenous   . Hernia repair   . Aortagram 07/29/2011    Abdominal Aortagram  . Cardiac catheterization 08/01/11    Left heart catheterization   Prior to Admission medications   Medication Sig Start Date End Date Taking? Authorizing Provider  amLODipine (NORVASC) 10 MG tablet Take 10 mg by mouth daily.   Yes Historical Provider, MD  aspirin EC 81 MG tablet Take 1 tablet (81 mg total) by mouth daily. 07/31/11 07/30/12 Yes Ok Anis, NP  calcium acetate (PHOSLO) 667 MG capsule Take 667-1,334 mg by mouth 3 (three) times daily with meals. 2 caps with each meal,  1 cap with snacks   Yes Historical Provider, MD  cinacalcet (SENSIPAR) 30 MG tablet Take 30 mg by mouth at bedtime.   Yes Historical Provider, MD  gabapentin (NEURONTIN) 100 MG capsule Take 100 mg by mouth 2 (two) times daily.   Yes Historical Provider, MD  isosorbide mononitrate (IMDUR) 30 MG 24 hr tablet Take 30 mg by mouth daily.   Yes Historical Provider, MD  lanthanum (FOSRENOL) 1000 MG chewable tablet Chew 1,000-2,000 mg by mouth 2 (two) times daily with a meal. 2 tabs with each meal, 1 tab with snacks   Yes Historical Provider, MD  lisinopril (PRINIVIL,ZESTRIL) 30 MG tablet Take 30 mg by mouth daily.   Yes Historical Provider, MD  metoprolol succinate (TOPROL-XL) 100 MG 24 hr tablet Take 100 mg by mouth 2 (two) times daily. Take with or immediately following a meal.   Yes Historical Provider, MD  multivitamin (RENA-VIT) TABS tablet Take 1 tablet by mouth daily.   Yes Historical Provider, MD  oxyCODONE (OXY IR/ROXICODONE) 5 MG immediate release tablet Take 5-10 mg by mouth every 6 (six) hours as needed. For pain   Yes Historical Provider, MD  sevelamer (RENVELA) 800 MG tablet Take 800-2,400 mg by mouth 3 (three) times daily with meals. Take 4 tabs  with meals and 1 tab with snacks   Yes Historical Provider, MD   Allergies No Known Allergies  Family History Family History  Problem Relation Age of Onset  . Breast cancer Mother   . Cancer Father   . Anesthesia problems Neg Hx   . Hypotension Neg Hx   . Malignant hyperthermia Neg Hx   . Pseudochol deficiency Neg Hx    Social History  reports that he quit smoking about 4 months ago. His smoking use included Cigarettes. He has a 40 pack-year smoking history. He does not have any smokeless tobacco history on file. He reports that he does not drink alcohol or use illicit drugs.  Review Of Systems:  Per HPI.  Pt lethargic, unable to answer.     Vital Signs: Pulse Rate:  [130-132] 132  (05/22 1529) Resp:  [20] 20  (05/22 1445) BP:  (42-115)/(20-74) 115/74 mmHg (05/22 1529) SpO2:  [98 %] 98 % (05/22 1529)  Physical Examination: General: chronically ill appearing male, acutely ill  Neuro: lethargic, arousable, MAE  CV: s1s2 rrr, no m/r/g, tachy  PULM: resps even, mildly labored, scattered ronchi GI: abd distended, firm, -bs Extremities: cool, dry, no edema   Active Problems:  ESRD (end stage renal disease)  Septic shock  Abdominal distension  Dg Chest Port 1 View  08/13/2011  *RADIOLOGY REPORT*  Clinical Data: Unresponsive.  Shortness of breath and abdominal distention.  PORTABLE CHEST - 1 VIEW  Comparison: 07/31/2011.  Findings: Trachea is midline.  Heart size stable.  There is bilateral air space disease, right greater than left, with progression from 07/31/2011.  Associated right pleural effusion. There may be a tiny left pleural effusion.  IMPRESSION: Worsening bilateral air space disease with an associated right pleural effusion.  Question tiny left pleural effusion.  Congestive heart failure is likely.  Original Report Authenticated By: Reyes Ivan, M.D.   Dg Abd Portable 1v  08/13/2011  *RADIOLOGY REPORT*  Clinical Data: Unresponsive.  Shortness of breath and abdominal distention.  PORTABLE ABDOMEN - 1 VIEW  Comparison: None.  Findings: There is mild gaseous distention of small bowel loops. Some colonic gas and stool are seen.  Surgical skin staples overlie the left lower quadrant.  Peripherally calcified structure in the left paraspinal region has the shape of the left kidney.  Additional tubular calcifications are seen in the left upper quadrant.  There may be small calcifications in the right paraspinal region.  IMPRESSION:  1.  Mild small bowel distention with possible wall thickening.  CT abdomen pelvis with contrast would likely be helpful in further evaluation, as clinically indicated. 2.  Calcified structures in the left upper quadrant, and likely right paraspinal region, as discussed above.  These could  also be further assessed with CT.  Original Report Authenticated By: Reyes Ivan, M.D.   CBC    Component Value Date/Time   WBC 12.7* 08/13/2011 1427   RBC 4.03* 08/13/2011 1427   HGB 11.4* 08/13/2011 1427   HCT 34.7* 08/13/2011 1427   PLT PLATELET CLUMPS NOTED ON SMEAR, COUNT APPEARS ADEQUATE 08/13/2011 1427   MCV 86.1 08/13/2011 1427   MCH 28.3 08/13/2011 1427   MCHC 32.9 08/13/2011 1427   RDW 18.0* 08/13/2011 1427   LYMPHSABS 1.3 08/13/2011 1427   MONOABS 0.9 08/13/2011 1427   EOSABS 0.0 08/13/2011 1427   BASOSABS 0.1 08/13/2011 1427    BMET    Component Value Date/Time   NA 139 07/31/2011 1225   K 5.0 07/31/2011 1225  CL 103 07/31/2011 1225   CO2 22 07/31/2011 1225   GLUCOSE 78 07/31/2011 1225   BUN 25* 07/31/2011 1225   CREATININE 7.9* 07/31/2011 1225   CALCIUM 10.3 07/31/2011 1225   GFRNONAA 8* 08/01/2010 0407   GFRNONAA 9* 08/01/2010 0407   GFRAA  Value: 10        The eGFR has been calculated using the MDRD equation. This calculation has not been validated in all clinical situations. eGFR's persistently <60 mL/min signify possible Chronic Kidney Disease.* 08/01/2010 0407   GFRAA  Value: 10        The eGFR has been calculated using the MDRD equation. This calculation has not been validated in all clinical situations. eGFR's persistently <60 mL/min signify possible Chronic Kidney Disease.* 08/01/2010 0407     ASSESSMENT AND PLAN  PULMONARY  A:  Hx R pleural effusion - likely r/t chf.   P:   High risk resp failure in setting septic shock  with aggressive volume resuscitation and underlying CHF, ESRD Monitor resp status closely  F/u cxr  F/u abg  CARDIOVASCULAR No results found for this basename: TROPONINI:5,LATICACIDVEN:5, O2SATVEN:5,PROBNP:5 in the last 168 hours ECG:  Sinus tach  Lines:  R fem CVL (ER) 5/22>>>  A: septic shock Chronic diastolic chf  P:  EGDT  Volume resuscitation  Lactate, pct, cortisol pending  Add stress steroids  Pressors as needed for MAP >65 Will  likely need to change CVL to IJ or [place] for CVP monitoring   RENAL  A:  ESRD  P:   F/u chem  Will have renal f/u >> CVVHD when stable in ICU, no urgent indication currently ABG   GASTROINTESTINAL No results found for this basename: AST:5,ALT:5,ALKPHOS:5,BILITOT:5,PROT:5,ALBUMIN:5 in the last 168 hours  A:  Nausea, vomiting, acute abd on exam  P:   CT abd/pelvis stat  Surgery to see  NPO  Add flagyl to abx   HEMATOLOGIC  Lab 08/13/11 1427  HGB 11.4*  HCT 34.7*  PLT PLATELET CLUMPS NOTED ON SMEAR, COUNT APPEARS ADEQUATE  INR --  APTT --   A:  Hx anemia  P:  F/u cbc  Transfuse for hgb <7  INFECTIOUS  Lab 08/13/11 1427  WBC 12.7*  PROCALCITON --   Cultures: BCx2 5/22>>> Sputum 5/22>>> Antibiotics: Vanc 5/22>>> Cefepime 5/22>>> Flagyl 5/22>>>  A:  Septic shock, ?abd source P:   EGDT  abx as above  Pan culture  See GI  ENDOCRINE No results found for this basename: GLUCAP:5 in the last 168 hours A: No acute issue    P:   Monitor CBG  NEUROLOGIC  A:  AMS -  In setting septic shock  P:   Supportive care  Intubate if declines and unable to protect airway   BEST PRACTICE / DISPOSITION - Level of Care:  ICU - Primary Service:  PCCM - Consultants:  surgery - Code Status:  Full  - Diet:  NPO - DVT Px:  scd - GI Px:  pepcid - Skin Integrity:  intact   WHITEHEART,KATHRYN, NP 08/13/2011  4:10 PM Pager: [phone]  *Care during the described time interval was provided by me and/or other providers on the critical care team. I have reviewed this patient's available data, including medical history, events of note, physical examination and test results as part of my evaluation.  CC time 60 minutes  Attending Addendum:  I have seen the patient, discussed the issues, test results and plans with K. Whiteheart, NP. I agree with the Assessment  and Plans as ammended above.   Levy Pupa, MD, PhD 08/13/2011, 5:06 PM [facility] [phone] or if no answer [phone]

## 2011-08-13 NOTE — ED Provider Notes (Signed)
History     CSN: [id number]  Arrival date & time 08/13/11  1403   First MD Initiated Contact with Patient 08/13/11 1408      Chief Complaint  Patient presents with  . Altered Mental Status    (Consider location/radiation/quality/duration/timing/severity/associated sxs/prior treatment) HPI  Patient Is a 61 year old man with past medical history of congestive heart failure, CVA, and end stage renal disease on hemodialysis, hyperlipidemia, hypertension, who presents with altered mental status and abdominal pain.  Per E, patient is living alone at home, he hasn't been seen by anyone since Monday. Patient was found by his family member (possibly his cousin) to be covered in feces at home. Stool is brown and greenish in color, without blood it. There was also black colored vomitus all over the ground in his home per EMS. Patient missed his HD on mon or today. Per EMS, patient is alert, responds to questioning, follows commands, no neuro deficits noted. Unable to obtain BP per ems. HR 130's.  His course is worsening Nothing improves his symptoms He denies shortness of breath   Past Medical History  Diagnosis Date  . Hyperlipidemia   . ESRD (end stage renal disease)     a. MWF dialysis in [place] (followed by Dr. Kristian Covey)  . Arthritis   . Claudication   . Stroke 2005  . Anemia   . Chronic diastolic CHF (congestive heart failure) 07/2010    a. 07/2008 Echo EF 50-55%, mild-mod LVH, Gr 1 DD.  Marland Kitchen Peripheral vascular occlusive disease     a. 07/2011 Periph Angio: No signif Ao-illiac dzs, LCF stenosis, 100% LSFA w recon above knee pop and 3 vessel runoff, 100% RSFA w/ recon above knee --> pending L Fem to below Knee pop bypass.  Marland Kitchen History of tobacco abuse   . Hypertension     takes Amlodipine,Metoprolol,and Prinivil daily  . Dyspnea on exertion     with exertion    Past Surgical History  Procedure Date  . Arteriovenous graft placement   . Av fistula placement 12/10/2000    Right  brachiocephalic arteriovenous   . Hernia repair   . Aortagram 07/29/2011    Abdominal Aortagram  . Cardiac catheterization 08/01/11    Left heart catheterization    Family History  Problem Relation Age of Onset  . Breast cancer Mother   . Cancer Father   . Anesthesia problems Neg Hx   . Hypotension Neg Hx   . Malignant hyperthermia Neg Hx   . Pseudochol deficiency Neg Hx     History  Substance Use Topics  . Smoking status: Former Smoker -- 1.0 packs/day for 40 years    Types: Cigarettes    Quit date: 03/25/2011  . Smokeless tobacco: Not on file  . Alcohol Use: No      Review of Systems  Constitutional: Negative for fever, chills, activity change and appetite change.  HENT: Negative for ear pain, congestion, facial swelling, rhinorrhea, sneezing, mouth sores, dental problem and voice change.   Eyes: Negative for pain, discharge and visual disturbance.  Respiratory: Negative for apnea, cough, wheezing and stridor.   Cardiovascular: Negative for chest pain, palpitations and leg swelling.  Gastrointestinal: Positive for vomiting, abdominal pain and abdominal distention. Negative for diarrhea, constipation, blood in stool and anal bleeding.  Genitourinary: Negative for dysuria, urgency, frequency and hematuria.  Musculoskeletal: Negative for back pain, arthralgias and gait problem.  Skin: Positive for pallor. Negative for rash and wound.  Neurological: Negative for dizziness, tremors, seizures, syncope,  numbness and headaches.  Hematological: Negative for adenopathy.  Psychiatric/Behavioral: Negative for suicidal ideas, hallucinations, confusion and agitation.    Allergies  Review of patient's allergies indicates no known allergies.  Home Medications   Current Outpatient Rx  Name Route Sig Dispense Refill  . AMLODIPINE BESYLATE 10 MG PO TABS Oral Take 10 mg by mouth daily.    . ASPIRIN EC 81 MG PO TBEC Oral Take 1 tablet (81 mg total) by mouth daily. 90 tablet 3  .  CALCIUM ACETATE 667 MG PO CAPS Oral Take 667-1,334 mg by mouth 3 (three) times daily with meals. 2 caps with each meal, 1 cap with snacks    . CINACALCET HCL 30 MG PO TABS Oral Take 30 mg by mouth at bedtime.    Marland Kitchen GABAPENTIN 100 MG PO CAPS Oral Take 100 mg by mouth 2 (two) times daily.    . ISOSORBIDE MONONITRATE ER 30 MG PO TB24 Oral Take 30 mg by mouth daily.    Marland Kitchen LANTHANUM CARBONATE 1000 MG PO CHEW Oral Chew 1,000-2,000 mg by mouth 2 (two) times daily with a meal. 2 tabs with each meal, 1 tab with snacks    . LISINOPRIL 30 MG PO TABS Oral Take 30 mg by mouth daily.    Marland Kitchen METOPROLOL SUCCINATE ER 100 MG PO TB24 Oral Take 100 mg by mouth 2 (two) times daily. Take with or immediately following a meal.    . RENA-VITE PO TABS Oral Take 1 tablet by mouth daily.    . OXYCODONE HCL 5 MG PO TABS Oral Take 5-10 mg by mouth every 6 (six) hours as needed. For pain    . SEVELAMER CARBONATE 800 MG PO TABS Oral Take 800-2,400 mg by mouth 3 (three) times daily with meals. Take 4 tabs with meals and 1 tab with snacks      BP 115/74  Pulse 132  Resp 20  SpO2 98%  Physical Exam  Constitutional: Vital signs reviewed.  Not in acute distress and cooperative with exam.  Head: Normocephalic and atraumatic Ear: TM normal bilaterally Mouth: no erythema or exudates, MMM Eyes: PERRL, EOMI, conjunctivae normal, No scleral icterus.  Neck: Supple, Trachea midline normal ROM, No JVD, mass, thyromegaly. No carotid bruit present.  Cardiac: S1/S2, RRR, tachycardia. No murmurs, gallops or rubs Pulm: There is expiratory rhonchi on the right side. No rales, wheezing or rubs. Abd: Distended, tender diffusely, worse on the RUQ. no rebound pain, difficult to palpate internal organs due to the distension. BS present. Ext: No rashes or edema, difficult to feel DP/PT pulse bilaterally GU: no CVA tenderness Musculoskeletal: No joint deformities, erythema, or stiffness, ROM full and nontender. No tenderness over his spin or  back. Hematology: no cervical, inginal, or axillary adenopathy.  Neuro: alert and oriented to place, but not to time and person, cranial nerves II-XII grossly intact, muscle strength 5/5 in all extremeties, sensation to light touch intact.  Skin: intact, dry and No rash, cyanosis, or clubbing.  Psychiatric: Normal mood and affect. speech and behavior is normal. Judgment and thought content normal. Cognition and memory are normal.   ED Course  CENTRAL LINE Performed by: Joya Gaskins Authorized by: Joya Gaskins Consent: Written consent not obtained. The procedure was performed in an emergent situation. Patient identity confirmed: verbally with patient Time out: Immediately prior to procedure a "time out" was called to verify the correct patient, procedure, equipment, support staff and site/side marked as required. Indications: vascular access Anesthesia: local infiltration Local  anesthetic: lidocaine 1% without epinephrine Anesthetic total: 3 ml Patient sedated: no Preparation: skin prepped with ChloraPrep Skin prep agent dried: skin prep agent completely dried prior to procedure Sterile barriers: all five maximum sterile barriers used - cap, mask, sterile gown, sterile gloves, and large sterile sheet Hand hygiene: hand hygiene performed prior to central venous catheter insertion Location details: right femoral Site selection rationale: emergent need due to hypotension and poor vascular access Patient position: flat Catheter type: triple lumen Catheter size: 7 Fr Pre-procedure: landmarks identified Ultrasound guidance: no Number of attempts: 2 Successful placement: yes Post-procedure: line sutured and dressing applied Assessment: blood return through all parts and free fluid flow Patient tolerance: Patient tolerated the procedure well with no immediate complications. Comments: Initial skin puncture resulted in arterial puncture, needle removed and pressure held, no  hematoma 2nd skin puncture resulted in successful placement   (including critical care time)  Etiology for his AMS and abdominal pain is not clear currently. He may have an acute abdomen. Current blood pressure is 42/20 mmHg on admission. Central line is placed. Levophed IV was given in ED. Then his blood pressure increased to 88/50 mmHg at 3:47 PM.    Labs Reviewed  CBC - Abnormal; Notable for the following:    WBC 12.7 (*)    RBC 4.03 (*)    Hemoglobin 11.4 (*)    HCT 34.7 (*)    RDW 18.0 (*)    All other components within normal limits  DIFFERENTIAL - Abnormal; Notable for the following:    Neutrophils Relative 82 (*)    Lymphocytes Relative 10 (*)    Neutro Abs 10.4 (*)    All other components within normal limits  TYPE AND SCREEN  LACTIC ACID, PLASMA  CULTURE, BLOOD (ROUTINE X 2)  CULTURE, BLOOD (ROUTINE X 2)  COMPREHENSIVE METABOLIC PANEL  LIPASE, BLOOD  OCCULT BLOOD X 1 CARD TO LAB, STOOL  ABO/RH   Dg Chest Port 1 View  08/13/2011  *RADIOLOGY REPORT*  Clinical Data: Unresponsive.  Shortness of breath and abdominal distention.  PORTABLE CHEST - 1 VIEW  Comparison: 07/31/2011.  Findings: Trachea is midline.  Heart size stable.  There is bilateral air space disease, right greater than left, with progression from 07/31/2011.  Associated right pleural effusion. There may be a tiny left pleural effusion.  IMPRESSION: Worsening bilateral air space disease with an associated right pleural effusion.  Question tiny left pleural effusion.  Congestive heart failure is likely.  Original Report Authenticated By: Reyes Ivan, M.D.   Dg Abd Portable 1v  08/13/2011  *RADIOLOGY REPORT*  Clinical Data: Unresponsive.  Shortness of breath and abdominal distention.  PORTABLE ABDOMEN - 1 VIEW  Comparison: None.  Findings: There is mild gaseous distention of small bowel loops. Some colonic gas and stool are seen.  Surgical skin staples overlie the left lower quadrant.  Peripherally calcified  structure in the left paraspinal region has the shape of the left kidney.  Additional tubular calcifications are seen in the left upper quadrant.  There may be small calcifications in the right paraspinal region.  IMPRESSION:  1.  Mild small bowel distention with possible wall thickening.  CT abdomen pelvis with contrast would likely be helpful in further evaluation, as clinically indicated. 2.  Calcified structures in the left upper quadrant, and likely right paraspinal region, as discussed above.  These could also be further assessed with CT.  Original Report Authenticated By: Reyes Ivan, M.D.     No diagnosis  found.  MDM   Patient's abdomen X-ray showed mild small bowel distention with possible wall thickening. Will get CT-abdomen for further evaluation of his abdomen.  Patient has leukocytosis with WBC of 12.7. His CXR showed worsening bilateral air space disease. Patient is given IV Vancomycin and zosyn to cover possible HCAP.   Will consult to critical care service.     Lorretta Harp, MD 08/13/11 1605  I have personally seen and examined the patient.  I have discussed the plan of care with the resident.  I have reviewed the documentation on PMH/FH/Soc. History.  I have reviewed the documentation of the resident and agree.   Joya Gaskins, MD 08/13/11 [phone]

## 2011-08-13 NOTE — ED Notes (Signed)
Levophed increased to 25mcg/min

## 2011-08-13 NOTE — ED Notes (Signed)
EDP at bedside placing central line (femoral)

## 2011-08-13 NOTE — ED Notes (Signed)
Pt bp noted to be 84/25, 4th liter infused. Critical care notified. Reporting hang 5th liter. Pt maxed on levophed at 50 mcg

## 2011-08-13 NOTE — Progress Notes (Signed)
Nasal cannula o2 left at 3L, blood gas at 180. Pulse ox unable to pick up. Will continue to monitor for hypoxia.

## 2011-08-13 NOTE — ED Notes (Signed)
Per ems-Pt hasn't been seen by anyone since Monday, pt covered in feces, ems reporting feces and emesis all over home. Abdomen distended, tender abdomen. Dialysis patient on MWF, didn't go on mon or today. Pt alert, responds to questioning. Following commands, no neuro deficits noted. Pt has 18 gauge in foot. Unable to obtain BP per ems. HR 130's.

## 2011-08-13 NOTE — Consult Note (Signed)
Reason for Consult:ESRD Referring Physician: [name] is an 61 y.o. male with a history of ESRD followed at an outside dialysis unit who presents after being found down at home covered in feces and vomit.  He reportedly missed hemodialysis Monday and was last dialyzed Friday.  Patient is not able to answer specific questions from me.  Dialyzes at [facility] HD unit on MWF. Primary Nephrologist Dr. Fausto [name]. [place].   Past Medical History  Diagnosis Date  . Hyperlipidemia   . ESRD (end stage renal disease)     a. MWF dialysis in [place] (followed by Dr. Kristian [name])  . Arthritis   . Claudication   . Stroke 2005  . Anemia   . Chronic diastolic CHF (congestive heart failure) 07/2010    a. 07/2008 Echo EF 50-55%, mild-mod LVH, Gr 1 DD.  Marland Kitchen Peripheral vascular occlusive disease     a. 07/2011 Periph Angio: No signif Ao-illiac dzs, LCF stenosis, 100% LSFA w recon above knee pop and 3 vessel runoff, 100% RSFA w/ recon above knee --> pending L Fem to below Knee pop bypass.  Marland Kitchen History of tobacco abuse   . Hypertension     takes Amlodipine,Metoprolol,and Prinivil daily  . Dyspnea on exertion     with exertion    Past Surgical History  Procedure Date  . Arteriovenous graft placement   . Av fistula placement 12/10/2000    Right brachiocephalic arteriovenous   . Hernia repair   . Aortagram 07/29/2011    Abdominal Aortagram  . Cardiac catheterization 08/01/11    Left heart catheterization    Family History  Problem Relation Age of Onset  . Breast cancer Mother   . Cancer Father   . Anesthesia problems Neg Hx   . Hypotension Neg Hx   . Malignant hyperthermia Neg Hx   . Pseudochol deficiency Neg Hx     Social History:  reports that he quit smoking about 4 months ago. His smoking use included Cigarettes. He has a 40 pack-year smoking history. He does not have any smokeless tobacco history on file. He reports that he does not drink alcohol or use illicit drugs.  Allergies: No  Known Allergies  Medications:  Scheduled:   . ceFEPime (MAXIPIME) IV  1 g Intravenous Q24H  . dextrose      . dextrose      . famotidine (PEPCID) IV  20 mg Intravenous Q12H  . hydrocortisone sodium succinate  50 mg Intrathecal Q6H  . iohexol  20 mL Oral Q1 Hr x 2  . metronidazole  500 mg Intravenous Once  . metronidazole  500 mg Intravenous Q8H  . norepinephrine      . norepinephrine      . norepinephrine (LEVOPHED) Adult infusion  2-50 mcg/min Intravenous Once  . piperacillin-tazobactam (ZOSYN)  IV  3.375 g Intravenous Once  . sodium chloride  1,000 mL Intravenous Once  . sodium chloride  1,000 mL Intravenous Once  . sodium chloride  1,000 mL Intravenous Once  . sodium chloride  25 mL/kg Intravenous Once  . vancomycin  500 mg Intravenous NOW  . vancomycin  1,000 mg Intravenous Once  . vancomycin  1,000 mg Intravenous Once  . DISCONTD: ceFEPime (MAXIPIME) IV  2 g Intravenous Once    Results for orders placed during the hospital encounter of 08/13/11 (from the past 48 hour(s))  CBC     Status: Abnormal   Collection Time   08/13/11  2:27 PM  Component Value Range Comment   WBC 12.7 (*) 4.0 - 10.5 (K/uL)    RBC 4.03 (*) 4.22 - 5.81 (MIL/uL)    Hemoglobin 11.4 (*) 13.0 - 17.0 (g/dL)    HCT 40.9 (*) 81.1 - 52.0 (%)    MCV 86.1  78.0 - 100.0 (fL)    MCH 28.3  26.0 - 34.0 (pg)    MCHC 32.9  30.0 - 36.0 (g/dL)    RDW 91.4 (*) 78.2 - 15.5 (%)    Platelets    150 - 400 (K/uL)    Value: PLATELET CLUMPS NOTED ON SMEAR, COUNT APPEARS ADEQUATE  DIFFERENTIAL     Status: Abnormal   Collection Time   08/13/11  2:27 PM      Component Value Range Comment   Neutrophils Relative 82 (*) 43 - 77 (%)    Lymphocytes Relative 10 (*) 12 - 46 (%)    Monocytes Relative 7  3 - 12 (%)    Eosinophils Relative 0  0 - 5 (%)    Basophils Relative 1  0 - 1 (%)    Neutro Abs 10.4 (*) 1.7 - 7.7 (K/uL)    Lymphs Abs 1.3  0.7 - 4.0 (K/uL)    Monocytes Absolute 0.9  0.1 - 1.0 (K/uL)    Eosinophils  Absolute 0.0  0.0 - 0.7 (K/uL)    Basophils Absolute 0.1  0.0 - 0.1 (K/uL)    RBC Morphology TARGET CELLS      WBC Morphology INCREASED BANDS (>20% BANDS)     TYPE AND SCREEN     Status: Normal   Collection Time   08/13/11  2:38 PM      Component Value Range Comment   ABO/RH(D) O POS      Antibody Screen NEG      Sample Expiration 08/16/2011     ABO/RH     Status: Normal   Collection Time   08/13/11  2:38 PM      Component Value Range Comment   ABO/RH(D) O POS     OCCULT BLOOD, POC DEVICE     Status: Normal   Collection Time   08/13/11  3:07 PM      Component Value Range Comment   Fecal Occult Bld POSITIVE     COMPREHENSIVE METABOLIC PANEL     Status: Abnormal   Collection Time   08/13/11  3:30 PM      Component Value Range Comment   Sodium 144  135 - 145 (mEq/L)    Potassium 4.7  3.5 - 5.1 (mEq/L)    Chloride 95 (*) 96 - 112 (mEq/L)    CO2 22  19 - 32 (mEq/L)    Glucose, Bld 112 (*) 70 - 99 (mg/dL)    BUN 81 (*) 6 - 23 (mg/dL)    Creatinine, Ser 9.56 (*) 0.50 - 1.35 (mg/dL)    Calcium 9.3  8.4 - 10.5 (mg/dL)    Total Protein 5.9 (*) 6.0 - 8.3 (g/dL)    Albumin 2.2 (*) 3.5 - 5.2 (g/dL)    AST 39 (*) 0 - 37 (U/L)    ALT 12  0 - 53 (U/L)    Alkaline Phosphatase 64  39 - 117 (U/L)    Total Bilirubin 0.5  0.3 - 1.2 (mg/dL)    GFR calc non Af Amer 6 (*) >90 (mL/min)    GFR calc Af Amer 6 (*) >90 (mL/min)   LIPASE, BLOOD     Status: Abnormal   Collection  Time   08/13/11  3:30 PM      Component Value Range Comment   Lipase 5 (*) 11 - 59 (U/L)   POCT I-STAT TROPONIN I     Status: Abnormal   Collection Time   08/13/11  4:10 PM      Component Value Range Comment   Troponin i, poc 0.09 (*) 0.00 - 0.08 (ng/mL)    Comment 3            POCT I-STAT, CHEM 8     Status: Abnormal   Collection Time   08/13/11  4:11 PM      Component Value Range Comment   Sodium 141  135 - 145 (mEq/L)    Potassium 4.4  3.5 - 5.1 (mEq/L)    Chloride 105  96 - 112 (mEq/L)    BUN 76 (*) 6 - 23 (mg/dL)      Creatinine, Ser 4.09 (*) 0.50 - 1.35 (mg/dL)    Glucose, Bld 811 (*) 70 - 99 (mg/dL)    Calcium, Ion 9.14 (*) 1.12 - 1.32 (mmol/L)    TCO2 25  0 - 100 (mmol/L)    Hemoglobin 10.2 (*) 13.0 - 17.0 (g/dL)    HCT 78.2 (*) 95.6 - 52.0 (%)   LACTIC ACID, PLASMA     Status: Abnormal   Collection Time   08/13/11  4:21 PM      Component Value Range Comment   Lactic Acid, Venous 4.7 (*) 0.5 - 2.2 (mmol/L)   GLUCOSE, CAPILLARY     Status: Abnormal   Collection Time   08/13/11  6:37 PM      Component Value Range Comment   Glucose-Capillary 176 (*) 70 - 99 (mg/dL)     Ct Abdomen Pelvis Wo Contrast  08/13/2011  *RADIOLOGY REPORT*  Clinical Data: Abdominal pain.  Unresponsive.  CT ABDOMEN AND PELVIS WITHOUT CONTRAST  Technique:  Multidetector CT imaging of the abdomen and pelvis was performed following the standard protocol without intravenous contrast.  Comparison: None.  Findings: Lack of intravenous and oral contrast severely limits the exam.  Moderate bilateral pleural effusions and bibasilar consolidation. Coronary artery calcifications.  Aortic valvular calcifications.  Gallbladder is decompressed.  Liver is within normal limits.  Small amount of free fluid anterior to the liver.  The stomach is markedly distended.  Small bowel loops are also markedly distended.  Distal small bowel is decompressed.  There is a small amount of enteric contrast in the lower abdomen at the midline likely contained within a bowel loop.  Colon is also relatively decompressed.  Small amount of free fluid is seen layering in the pelvis.  The kidneys have a markedly abnormal appearance bilaterally.  The left kidney is enlarged and markedly deformed with areas of curvilinear calcification and mixed density.  Similar changes in the right kidney but to a lesser degree.  Underlying renal mass cannot be excluded.  Dense vascular calcifications are noted.  No obvious free intraperitoneal gas.  Metallic staples are seen in the anterior  abdominal wall to the left of midline in the lower abdomen.  Prominent degenerative changes of the hip joints there is a lytic area in  in the right pelvis, above the right acetabulum.  There is loss of cortex in the overlying bone.  There is also a lytic area in the posterior inferior right acetabulum on image 88.  These lytic areas are greater than one would expect with simple degenerative changes.  Aggressive process cannot be excluded.  Gas within venous structures near the right groin are likely iatrogenic.  IMPRESSION: Very limited exam.  Bilateral pleural effusions and bibasilar consolidation.  Small bowel obstruction pattern.  The transition point is in the lower abdomen.  There are lytic lesions about the right acetabulum as described. An aggressive process such as metastatic disease or myeloma cannot be excluded.  MRI may be helpful.  Markedly abnormal appearance of the kidneys.  Renal mass cannot be excluded.  Original Report Authenticated By: Donavan Burnet, M.D.   Dg Chest 1 View  08/13/2011  *RADIOLOGY REPORT*  Clinical Data: Pneumonia  CHEST - 1 VIEW  Comparison: 08/13/2011  Findings: Diffuse airspace disease throughout the right lung. Small right pleural effusion.  Minimal linear atelectasis at the left base.  Cardiomegaly.  Airspace disease on the left has improved.  IMPRESSION: Improved left airspace disease.  Stable airspace disease throughout the right lung with a right pleural effusion.  Original Report Authenticated By: Donavan Burnet, M.D.   Dg Chest Port 1 View  08/13/2011  *RADIOLOGY REPORT*  Clinical Data: Unresponsive.  Shortness of breath and abdominal distention.  PORTABLE CHEST - 1 VIEW  Comparison: 07/31/2011.  Findings: Trachea is midline.  Heart size stable.  There is bilateral air space disease, right greater than left, with progression from 07/31/2011.  Associated right pleural effusion. There may be a tiny left pleural effusion.  IMPRESSION: Worsening bilateral air space  disease with an associated right pleural effusion.  Question tiny left pleural effusion.  Congestive heart failure is likely.  Original Report Authenticated By: Reyes Ivan, M.D.   Dg Abd Portable 1v  08/13/2011  *RADIOLOGY REPORT*  Clinical Data: Unresponsive.  Shortness of breath and abdominal distention.  PORTABLE ABDOMEN - 1 VIEW  Comparison: None.  Findings: There is mild gaseous distention of small bowel loops. Some colonic gas and stool are seen.  Surgical skin staples overlie the left lower quadrant.  Peripherally calcified structure in the left paraspinal region has the shape of the left kidney.  Additional tubular calcifications are seen in the left upper quadrant.  There may be small calcifications in the right paraspinal region.  IMPRESSION:  1.  Mild small bowel distention with possible wall thickening.  CT abdomen pelvis with contrast would likely be helpful in further evaluation, as clinically indicated. 2.  Calcified structures in the left upper quadrant, and likely right paraspinal region, as discussed above.  These could also be further assessed with CT.  Original Report Authenticated By: Reyes Ivan, M.D.    ROS: Not obtainable Blood pressure 91/46, pulse 131, temperature 98.1 F (36.7 C), temperature source Oral, resp. rate 15, weight 71.215 kg (157 lb), SpO2 98.00%. General appearance: moderate distress Head: Normocephalic, without obvious abnormality, atraumatic Eyes: conjunctivae/corneas clear. PERRL, EOM's intact. Fundi benign. Ears: normal TM's and external ear canals both ears Nose: Nares normal. Septum midline. Mucosa normal. No drainage or sinus tenderness. Resp: comfortable resperations Chest wall:nontender Cardio: normal apical impulse and regularly irregular rhythm GI: distended, tight, tympanetic Extremities: no edema, redness or tenderness in the calves or thighs, left great toe with ischemic changes, AVF left functionin, AVF right clotted Skin: Skin  color, texture, turgor normal. No rashes or lesions Neurologic:agitated, uncomfortable  Assessment/Plan: 1 ESRD 2 Small Bowel Obstruction with hypovolemia due to GI losses 3 Shock 4. Pulmonary Edema/ right pleural effusion 5. Lytic lesion right pelvis 6  Peripheral Artery Disease  Will plan for intermittent dialysis in morning, if stable hemodynamics  Eddison Searls C  08/13/2011, 8:48 PM

## 2011-08-14 ENCOUNTER — Encounter (HOSPITAL_COMMUNITY): Payer: Self-pay | Admitting: Anesthesiology

## 2011-08-14 ENCOUNTER — Inpatient Hospital Stay (HOSPITAL_COMMUNITY): Payer: Medicare Other

## 2011-08-14 ENCOUNTER — Other Ambulatory Visit: Payer: Self-pay

## 2011-08-14 DIAGNOSIS — I4891 Unspecified atrial fibrillation: Secondary | ICD-10-CM

## 2011-08-14 DIAGNOSIS — J189 Pneumonia, unspecified organism: Secondary | ICD-10-CM

## 2011-08-14 DIAGNOSIS — A0472 Enterocolitis due to Clostridium difficile, not specified as recurrent: Secondary | ICD-10-CM | POA: Diagnosis present

## 2011-08-14 DIAGNOSIS — I4892 Unspecified atrial flutter: Secondary | ICD-10-CM

## 2011-08-14 DIAGNOSIS — K56609 Unspecified intestinal obstruction, unspecified as to partial versus complete obstruction: Secondary | ICD-10-CM | POA: Diagnosis present

## 2011-08-14 DIAGNOSIS — N186 End stage renal disease: Secondary | ICD-10-CM

## 2011-08-14 DIAGNOSIS — J90 Pleural effusion, not elsewhere classified: Secondary | ICD-10-CM

## 2011-08-14 DIAGNOSIS — I2510 Atherosclerotic heart disease of native coronary artery without angina pectoris: Secondary | ICD-10-CM | POA: Diagnosis present

## 2011-08-14 LAB — GLUCOSE, CAPILLARY: Glucose-Capillary: 78 mg/dL (ref 70–99)

## 2011-08-14 LAB — CARDIAC PANEL(CRET KIN+CKTOT+MB+TROPI)
CK, MB: 5.7 ng/mL — ABNORMAL HIGH (ref 0.3–4.0)
Relative Index: 1.4 (ref 0.0–2.5)
Troponin I: <0.3 ng/mL (ref <0.30)

## 2011-08-14 LAB — BASIC METABOLIC PANEL
BUN: 89 mg/dL — ABNORMAL HIGH (ref 6–23)
CO2: 25 mEq/L (ref 19–32)
Calcium: 9.2 mg/dL (ref 8.4–10.5)
Creatinine, Ser: 8.77 mg/dL — ABNORMAL HIGH (ref 0.50–1.35)
GFR calc non Af Amer: 6 mL/min — ABNORMAL LOW (ref >90)
Glucose, Bld: 92 mg/dL (ref 70–99)
Sodium: 141 mEq/L (ref 135–145)

## 2011-08-14 LAB — CBC
Platelets: 320 10*3/uL (ref 150–400)
RDW: 17.8 % — ABNORMAL HIGH (ref 11.5–15.5)
WBC: 8.8 10*3/uL (ref 4.0–10.5)

## 2011-08-14 LAB — PROTIME-INR
INR: 1.62 — ABNORMAL HIGH (ref 0.00–1.49)
Prothrombin Time: 19.5 seconds — ABNORMAL HIGH (ref 11.6–15.2)

## 2011-08-14 LAB — PHOSPHORUS: Phosphorus: 7.8 mg/dL — ABNORMAL HIGH (ref 2.3–4.6)

## 2011-08-14 LAB — FERRITIN: Ferritin: 3239 ng/mL — ABNORMAL HIGH (ref 22–322)

## 2011-08-14 LAB — IRON AND TIBC
Iron: 15 ug/dL — ABNORMAL LOW (ref 42–135)
Saturation Ratios: 19 % — ABNORMAL LOW (ref 20–55)
TIBC: 78 ug/dL — ABNORMAL LOW (ref 215–435)

## 2011-08-14 LAB — PRO B NATRIURETIC PEPTIDE: Pro B Natriuretic peptide (BNP): >70000 pg/mL — ABNORMAL HIGH (ref 0–125)

## 2011-08-14 LAB — FIBRINOGEN: Fibrinogen: 695 mg/dL — ABNORMAL HIGH (ref 204–475)

## 2011-08-14 LAB — CLOSTRIDIUM DIFFICILE BY PCR: Toxigenic C. Difficile by PCR: POSITIVE — AB

## 2011-08-14 MED ORDER — VANCOMYCIN HCL 1000 MG IV SOLR
750.0000 mg | Freq: Once | INTRAVENOUS | Status: AC
  Administered 2011-08-14: 750 mg via INTRAVENOUS
  Filled 2011-08-14: qty 750

## 2011-08-14 MED ORDER — ALBUMIN HUMAN 25 % IV SOLN
12.5000 g | Freq: Once | INTRAVENOUS | Status: AC
  Administered 2011-08-14: 12.5 g via INTRAVENOUS

## 2011-08-14 MED ORDER — METOPROLOL TARTRATE 1 MG/ML IV SOLN
5.0000 mg | INTRAVENOUS | Status: DC | PRN
  Filled 2011-08-14: qty 5

## 2011-08-14 MED ORDER — INSULIN ASPART 100 UNIT/ML ~~LOC~~ SOLN: 0.0000 [IU] | SUBCUTANEOUS | Status: DC

## 2011-08-14 MED ORDER — HYDROCORTISONE SOD SUCCINATE 100 MG IJ SOLR
50.0000 mg | Freq: Four times a day (QID) | INTRAMUSCULAR | Status: DC
  Administered 2011-08-14 (×2): 50 mg via INTRAVENOUS
  Filled 2011-08-14 (×6): qty 1

## 2011-08-14 MED ORDER — DARBEPOETIN ALFA-POLYSORBATE 60 MCG/0.3ML IJ SOLN
60.0000 ug | Freq: Once | INTRAMUSCULAR | Status: AC
  Administered 2011-08-14: 60 ug via INTRAVENOUS
  Filled 2011-08-14: qty 0.3

## 2011-08-14 NOTE — Clinical Social Work Psychosocial (Signed)
     Clinical Social Work Department BRIEF PSYCHOSOCIAL ASSESSMENT 08/14/2011  Patient:  [name], [name]     Account Number:  [id number]     Admit date:  08/13/2011  Clinical Social Worker:  Margaree Mackintosh  Date/Time:  08/14/2011 03:19 PM  Referred by:  Care Management  Date Referred:  08/14/2011 Referred for  Advanced Directives   Other Referral:   Interview type:  Family Other interview type:    PSYCHOSOCIAL DATA Living Status:  FAMILY Admitted from facility:   Level of care:   Primary support name:  Zollie Scale- Primary support relationship to patient:  SPOUSE Degree of support available:   Unknown.    CURRENT CONCERNS Current Concerns  Other - See comment   Other Concerns:    SOCIAL WORK ASSESSMENT / PLAN Clinical Social Worker recieved referral from [facility] indicating that there was a quesiton related to identifying a Management consultant for pt.  CSW met with pt's sister-[name] and brother in law-[name].  CSW introduced self, explained role, and provided support.  CSW reviewed HCPOA regulations in [place].  Sister and brother in law stated they understood that the pt's wife is the decision maker, followed by pt's son, and then pt's sibling.  Unless pt's wife and/or son decide they do not wish to be the decision maker.  CSW attempted to phone pt's wife, to review HCPOA, the number listed in the chart had a voicemail message that indicated it was a nother person.  CSW to be available as needed to assist.   Assessment/plan status:  Information/Referral to [facility] Other assessment/ plan:   Information/referral to community resources:   HCPOA    PATIENTS/FAMILYS RESPONSE TO PLAN OF CARE: Pt currenly unable to fully participate in assessment. Family appeared receptive to CSW intervention.

## 2011-08-14 NOTE — Consult Note (Signed)
CARDIOLOGY CONSULT NOTE      Primary Care Physician: Georgann Housekeeper, MD, MD Referring Physician:  Admit Date: 08/13/2011  Reason for consultation:  Atrial arrhythmias  [name] is a 61 y.o. male with a h/o multiple co morbidities including ESRD, CAD (medically managed), HTN, PVD, and prior stroke.  He presents after being found down.  He does not recall events leading up to his admission and history is therefore per the medical record. Per report, he was found at home 5/22 lethargic and covered in stool/ vomit.  He has been diagnosed with SBO and admitted to [facility] for septic shock.  He has been observed to have both atrial flutter and atrial fibrillation. He is making slow recovery.  Presently he is without complaint. Today, he denies symptoms of palpitations, chest pain, shortness of breath, or any other concerns.  Past Medical History  Diagnosis Date  . Hyperlipidemia   . ESRD (end stage renal disease)     a. MWF dialysis in [place] (followed by Dr. Kristian Covey)  . Arthritis   . Claudication   . Stroke 2005  . Anemia   . Chronic diastolic CHF (congestive heart failure) 07/2010    a. 07/2008 Echo EF 50-55%, mild-mod LVH, Gr 1 DD.  Marland Kitchen Peripheral vascular occlusive disease     a. 07/2011 Periph Angio: No signif Ao-illiac dzs, LCF stenosis, 100% LSFA w recon above knee pop and 3 vessel runoff, 100% RSFA w/ recon above knee --> pending L Fem to below Knee pop bypass.  Marland Kitchen History of tobacco abuse   . Hypertension     takes Amlodipine,Metoprolol,and Prinivil daily  . Dyspnea on exertion     with exertion   Past Surgical History  Procedure Date  . Arteriovenous graft placement   . Av fistula placement 12/10/2000    Right brachiocephalic arteriovenous   . Hernia repair   . Aortagram 07/29/2011    Abdominal Aortagram  . Cardiac catheterization 08/01/11    Left heart catheterization       . albumin human  12.5 g Intravenous Once  . ceFEPime (MAXIPIME) IV  1 g Intravenous Q24H  .  darbepoetin (ARANESP) injection - DIALYSIS  60 mcg Intravenous Once  . dextrose      . dextrose      . famotidine (PEPCID) IV  20 mg Intravenous Q12H  . insulin aspart  0-9 Units Subcutaneous Q4H  . iohexol  20 mL Oral Q1 Hr x 2  . metronidazole  500 mg Intravenous Once  . metronidazole  500 mg Intravenous Q8H  . norepinephrine      . norepinephrine (LEVOPHED) Adult infusion  2-50 mcg/min Intravenous Once  . piperacillin-tazobactam (ZOSYN)  IV  3.375 g Intravenous Once  . sodium chloride  25 mL/kg Intravenous Once  . vancomycin  500 mg Intravenous NOW  . vancomycin  750 mg Intravenous Once  . vancomycin  1,000 mg Intravenous Once  . DISCONTD: ceFEPime (MAXIPIME) IV  2 g Intravenous Once  . DISCONTD: hydrocortisone sod succinate (SOLU-CORTEF) injection  50 mg Intravenous Q6H  . DISCONTD: hydrocortisone sodium succinate  50 mg Intrathecal Q6H      . sodium chloride 20 mL/hr (08/14/11 0044)  . DISCONTD: sodium chloride    . DISCONTD: phenylephrine (NEO-SYNEPHRINE) Adult infusion 30 mcg/min (08/13/11 2300)  . DISCONTD: vasopressin (PITRESSIN) infusion - *FOR SHOCK* 0.03 Units/min (08/13/11 1905)    No Known Allergies  History   Social History  . Marital Status: Legally Separated    Spouse  Name: N/A    Number of Children: N/A  . Years of Education: N/A   Occupational History  . DISABLED    Social History Main Topics  . Smoking status: Former Smoker -- 1.0 packs/day for 40 years    Types: Cigarettes    Quit date: 03/25/2011  . Smokeless tobacco: Not on file  . Alcohol Use: No  . Drug Use: No  . Sexually Active: Not Currently   Other Topics Concern  . Not on file   Social History Narrative   Lives in [place].  His cousin helps him out and drives him to appts. He quit smoking earlier this year.    Family History  Problem Relation Age of Onset  . Breast cancer Mother   . Cancer Father   . Anesthesia problems Neg Hx   . Hypotension Neg Hx   . Malignant hyperthermia  Neg Hx   . Pseudochol deficiency Neg Hx     ROS-  Pt is ill and unable to provide  Physical Exam: Telemetry: Filed Vitals:   08/14/11 1441 08/14/11 1505 08/14/11 1535 08/14/11 1600  BP: 99/46 120/62 122/53   Pulse: 123 114    Temp:  98.1 F (36.7 C)  98.1 F (36.7 C)  TempSrc:    Oral  Resp: 17  13   Weight:   148 lb 5.9 oz (67.3 kg)   SpO2: 100% 100%      GEN- The patient is ill appearing, alert and oriented x 3 today.   Head- normocephalic, atraumatic Eyes-  Sclera clear, conjunctiva pink Ears- hearing intact Oropharynx- clear Neck- supple,   Lungs- Clear to ausculation bilaterally, normal work of breathing Heart- irregular rate and rhythm  GI- distended, hypoactive BS Extremities- no clubbing, cyanosis, +1 dependant edema MS-diffuse atrophy Skin- no rash or lesion Psych- confused, unable to provide full history Neuro- strength and sensation are intact  EKG:  Typical appearing atrial flutter, LVH with early repolarization  Labs:   Lab Results  Component Value Date   WBC 8.8 08/14/2011   HGB 9.0* 08/14/2011   HCT 27.4* 08/14/2011   MCV 84.8 08/14/2011   PLT 320 08/14/2011    Lab 08/14/11 0435 08/13/11 1530  NA 141 --  K 4.8 --  CL 96 --  CO2 25 --  BUN 89* --  CREATININE 8.77* --  CALCIUM 9.2 --  PROT -- 5.9*  BILITOT -- 0.5  ALKPHOS -- 64  ALT -- 12  AST -- 39*  GLUCOSE 92 --   Lab Results  Component Value Date   CKTOTAL 254* 08/14/2011   CKMB 4.4* 08/14/2011   TROPONINI <0.30 08/14/2011    Lab Results  Component Value Date   CHOL  Value: 147        ATP III CLASSIFICATION:  <200     mg/dL   Desirable  098-119  mg/dL   Borderline High  >=147    mg/dL   High        10/23/9560   Lab Results  Component Value Date   HDL 33* 07/22/2008   Lab Results  Component Value Date   LDLCALC  Value: 100        Total Cholesterol/HDL:CHD Risk Coronary Heart Disease Risk Table                     Men   Women  1/2 Average Risk   3.4   3.3  Average Risk       5.0  4.4   2 X Average Risk   9.6   7.1  3 X Average Risk  23.4   11.0        Use the calculated Patient Ratio above and the CHD Risk Table to determine the patient's CHD Risk.        ATP III CLASSIFICATION (LDL):  <100     mg/dL   Optimal  454-098  mg/dL   Near or Above                    Optimal  130-159  mg/dL   Borderline  119-147  mg/dL   High  >829     mg/dL   Very High* 07/27/2128   Lab Results  Component Value Date   TRIG 72 07/22/2008   Lab Results  Component Value Date   CHOLHDL 4.5 07/22/2008   No results found for this basename: LDLDIRECT      Echo: pending  Dr Lindaann Slough cath from 5/13 is reviewed  ASSESSMENT AND PLAN:  1.  Atrial flutter/ atrial fibrillation The patient presents with atrial flutter.  Presently, he is in afib.  This is new when compared to ekg 07/30/11 and is likely due to his acute medical illness.  He is presently reasonably rate controlled.  Given recent hypotension, I am reluctant to more aggressively rate control at this time.  If necessary cardizem can be used. He will require anticoagulation with coumadin when felt reasonable to initiate by the primary team. Echo is pending  2. CAD- recent cath is reviewed Continue medical therapy as hypotension resolves Echo is pending  3. Hypotension- improved with IVF  4. Other medical issues- per primary team   Hillis Range, MD 08/14/2011  5:34 PM

## 2011-08-14 NOTE — Progress Notes (Signed)
Name: [name] MRN: [id number] DOB: December 23, 1950    LOS: 1  Referring Provider:  EDP Reason for Referral:  Septic shock   PULMONARY / CRITICAL CARE MEDICINE  HPI:  61 yo male former smoker found at home with lethargy, covered in stool/vomit and admitted 08/13/2011 with acute encephalopathy, hypotension.  Last seen 5/20.  Found to have discrepancy between aline pressure readings and cuff pressure readings.  PCCM asked to admit with concern for septic shock. PMHx ESRD on HD, CVA, Diastolic CHF, PVD, HTN  Subjective: More alert.  Denies chest pain.  Still has abdominal discomfort.  Weaned off pressors overnight.  Developed A fib overnight.   Vital Signs: Temp:  [97.8 F (36.6 C)-98.4 F (36.9 C)] 98 F (36.7 C) (05/23 0755) Pulse Rate:  [47-133] 80  (05/23 1115) Resp:  [14-27] 15  (05/23 1115) BP: (42-131)/(10-86) 124/50 mmHg (05/23 1115) SpO2:  [93 %-100 %] 99 % (05/23 1115) Arterial Line BP: (94-133)/(43-75) 119/50 mmHg (05/23 1000) Weight:  [145 lb 15.1 oz (66.2 kg)-157 lb (71.215 kg)] 145 lb 15.1 oz (66.2 kg) (05/23 0400)   Intake/Output Summary (Last 24 hours) at 08/14/11 1213 Last data filed at 08/14/11 1000  Gross per 24 hour  Intake   1890 ml  Output   2350 ml  Net   -460 ml    Physical Examination: General - no distress HEENT- OG tube in place Cardiac - irregular, no murmur Chest - basilar rales, scattered rhonchi, no wheeze Abd - mild distention, soft, mild tenderness with palpation, decreased bowel sounds Ext - no edema Neuro - alert, follows simple commands  Ct Abdomen Pelvis Wo Contrast  08/13/2011  *RADIOLOGY REPORT*  Clinical Data: Abdominal pain.  Unresponsive.  CT ABDOMEN AND PELVIS WITHOUT CONTRAST  Technique:  Multidetector CT imaging of the abdomen and pelvis was performed following the standard protocol without intravenous contrast.  Comparison: None.  Findings: Lack of intravenous and oral contrast severely limits the exam.  Moderate bilateral  pleural effusions and bibasilar consolidation. Coronary artery calcifications.  Aortic valvular calcifications.  Gallbladder is decompressed.  Liver is within normal limits.  Small amount of free fluid anterior to the liver.  The stomach is markedly distended.  Small bowel loops are also markedly distended.  Distal small bowel is decompressed.  There is a small amount of enteric contrast in the lower abdomen at the midline likely contained within a bowel loop.  Colon is also relatively decompressed.  Small amount of free fluid is seen layering in the pelvis.  The kidneys have a markedly abnormal appearance bilaterally.  The left kidney is enlarged and markedly deformed with areas of curvilinear calcification and mixed density.  Similar changes in the right kidney but to a lesser degree.  Underlying renal mass cannot be excluded.  Dense vascular calcifications are noted.  No obvious free intraperitoneal gas.  Metallic staples are seen in the anterior abdominal wall to the left of midline in the lower abdomen.  Prominent degenerative changes of the hip joints there is a lytic area in  in the right pelvis, above the right acetabulum.  There is loss of cortex in the overlying bone.  There is also a lytic area in the posterior inferior right acetabulum on image 88.  These lytic areas are greater than one would expect with simple degenerative changes.  Aggressive process cannot be excluded.  Gas within venous structures near the right groin are likely iatrogenic.  IMPRESSION: Very limited exam.  Bilateral pleural effusions and  bibasilar consolidation.  Small bowel obstruction pattern.  The transition point is in the lower abdomen.  There are lytic lesions about the right acetabulum as described. An aggressive process such as metastatic disease or myeloma cannot be excluded.  MRI may be helpful.  Markedly abnormal appearance of the kidneys.  Renal mass cannot be excluded.  Original Report Authenticated By: Donavan Burnet,  M.D.   Dg Chest 1 View  08/13/2011  *RADIOLOGY REPORT*  Clinical Data: Pneumonia  CHEST - 1 VIEW  Comparison: 08/13/2011  Findings: Diffuse airspace disease throughout the right lung. Small right pleural effusion.  Minimal linear atelectasis at the left base.  Cardiomegaly.  Airspace disease on the left has improved.  IMPRESSION: Improved left airspace disease.  Stable airspace disease throughout the right lung with a right pleural effusion.  Original Report Authenticated By: Donavan Burnet, M.D.   Portable Chest Xray In Am  08/14/2011  *RADIOLOGY REPORT*  Clinical Data: Respiratory failure  PORTABLE CHEST - 1 VIEW  Comparison: Aug 13, 2011  Findings: There is cardiomegaly and pulmonary edema, as well as a moderate sized right pleural effusion.  Overall, the findings  have not significantly changed.  The nasogastric tube tip courses off the inferior film.  IMPRESSION: No significant change in cardiomegaly, pulmonary edema, moderate sized right pleural effusion.  Original Report Authenticated By: Brandon Melnick, M.D.   Dg Chest Port 1 View  08/13/2011  *RADIOLOGY REPORT*  Clinical Data: Unresponsive.  Shortness of breath and abdominal distention.  PORTABLE CHEST - 1 VIEW  Comparison: 07/31/2011.  Findings: Trachea is midline.  Heart size stable.  There is bilateral air space disease, right greater than left, with progression from 07/31/2011.  Associated right pleural effusion. There may be a tiny left pleural effusion.  IMPRESSION: Worsening bilateral air space disease with an associated right pleural effusion.  Question tiny left pleural effusion.  Congestive heart failure is likely.  Original Report Authenticated By: Reyes Ivan, M.D.   Dg Abd Portable 1v  08/13/2011  *RADIOLOGY REPORT*  Clinical Data: Unresponsive.  Shortness of breath and abdominal distention.  PORTABLE ABDOMEN - 1 VIEW  Comparison: None.  Findings: There is mild gaseous distention of small bowel loops. Some colonic gas and  stool are seen.  Surgical skin staples overlie the left lower quadrant.  Peripherally calcified structure in the left paraspinal region has the shape of the left kidney.  Additional tubular calcifications are seen in the left upper quadrant.  There may be small calcifications in the right paraspinal region.  IMPRESSION:  1.  Mild small bowel distention with possible wall thickening.  CT abdomen pelvis with contrast would likely be helpful in further evaluation, as clinically indicated. 2.  Calcified structures in the left upper quadrant, and likely right paraspinal region, as discussed above.  These could also be further assessed with CT.  Original Report Authenticated By: Reyes Ivan, M.D.   CBC    Component Value Date/Time   WBC 8.8 08/14/2011 0435   RBC 3.23* 08/14/2011 0435   HGB 9.0* 08/14/2011 0435   HCT 27.4* 08/14/2011 0435   PLT 320 08/14/2011 0435   MCV 84.8 08/14/2011 0435   MCH 27.9 08/14/2011 0435   MCHC 32.8 08/14/2011 0435   RDW 17.8* 08/14/2011 0435   LYMPHSABS 1.3 08/13/2011 1427   MONOABS 0.9 08/13/2011 1427   EOSABS 0.0 08/13/2011 1427   BASOSABS 0.1 08/13/2011 1427    BMET    Component Value Date/Time   NA  141 08/14/2011 0435   K 4.8 08/14/2011 0435   CL 96 08/14/2011 0435   CO2 25 08/14/2011 0435   GLUCOSE 92 08/14/2011 0435   BUN 89* 08/14/2011 0435   CREATININE 8.77* 08/14/2011 0435   CALCIUM 9.2 08/14/2011 0435   GFRNONAA 6* 08/14/2011 0435   GFRAA 7* 08/14/2011 0435    Lab Results  Component Value Date   ALT 12 08/13/2011   AST 39* 08/13/2011   ALKPHOS 64 08/13/2011   BILITOT 0.5 08/13/2011    ABG    Component Value Date/Time   PHART 7.278* 07/31/2010 0650   PCO2ART 61.0 CRITICAL RESULT CALLED TO, READ BACK BY AND VERIFIED WITH: RHONDA BUSS,RN BY PEVIANY LAWSON,RRT ON 07/31/2010. CRITICAL RESULT CALLED TO, READ BACK BY AND VERIFIED WITH: RHONDA BUSS,RN BY PEVIANY LAWSON,RRT ON 07/31/2010 AT 0707.* 07/31/2010 0650   PO2ART 187.0* 07/31/2010 0650   HCO3 27.6* 07/31/2010  0650   TCO2 25 08/13/2011 1611   O2SAT 99.2 07/31/2010 0650       ASSESSMENT AND PLAN  PULMONARY  Acute hypoxic respiratory failure.  Likely 2nd to PNA, pleural effusion. Plan: -f/u CXR -titrate oxygen to keep SpO2 > 92%   CARDIOVASCULAR  5/08 Myoview>>fixed basal to mid-inferior perfusion defect, EF 44% 5/10 cardiac cath>>Single vessel obstructive CAD. Moderate nonobstructive plaque elsewhere. Mild LV dysfunction.  Rt femoral CVL (ER) 5/22>>> Rt femoral Aline 5/22>>>  ?Septic shock>>marked difference between aline and cuff pressure readings.  Procalcitonin 103 from 5/22.  Quickly weaned off pressors after aline placed. Plan: -monitor BP off pressors -d/c solucortef  Chronic Systolic/diastolic CHF, PVD. Plan: -check Echo  New onset A fib 5/23. Plan: -consult cardiology -prn metoprolol   RENAL  ESRD on HD.  Gets outpt HD in [place] MWF. Plan: -HD per renal -f/u renal panel -goal negative fluid balance   GASTROINTESTINAL  5/22 CT abd/pelvis>>mod b/l pleural effusions, lytic lesion Rt pelvis and Rt acetabulum, SBO, ?renal mass  SBO. Plan: -keep NPO -f/u KUB -NG tube to suction -if no improvement may need surgical evaluation   HEMATOLOGY/ONCOLOGY  Anemia of chronic disease, renal failure, and critical illness. Plan: -f/u CBC -transfuse for Hb < 7  ?lytic lesions Rt pelvis, Rt acetabulum with ?renal mass. Plan: -will need MRI when more stable to further assess  INFECTIOUS  Cultures: BCx2 5/22>>> Sputum 5/22>>> C diff PCR 5/22>>POSITIVE  Antibiotics: Vanc 5/22>>> Cefepime 5/22>>> Flagyl 5/22>>>  Sepsis.  C dif positive, possible pneumonia. Plan: -D 2/x cefepime, vancomycin>>continue for now pending culture results -D 2/x flagyl for C diff   ENDOCRINE  Lab 08/13/11 1837  GLUCAP 176*   Hyperglycemia.  No hx of DM. Plan: -SSI   NEUROLOGIC  Acute metabolic encephalopathy.  Improved mental status 5/23. Plan: -monitor  clinically   BEST PRACTICE / DISPOSITION -Pepcid for SUP -SCD for DVT prophylaxis -Full code  Updated family at bedside.  Critical care time 40 minutes.  Coralyn Helling, MD 08/14/2011, 11:38 AM Pager:  [phone]

## 2011-08-14 NOTE — Progress Notes (Signed)
[facility] Progress Note  Subjective:  Says feels better Abd sore Not SOB Afib/flutter during the night getting prn lopressor Objective Off pressors Cuff pressures over 100 Filed Vitals:   08/14/11 0400 08/14/11 0500 08/14/11 0600 08/14/11 0755  BP:      Pulse:  108 47   Temp:    98 F (36.7 C)  TempSrc:    Oral  Resp: 19 21 20    Weight: 66.2 kg (145 lb 15.1 oz)     SpO2: 100% 93% 99%   BP 48/25  Pulse 47  Temp(Src) 98 F (36.7 C) (Oral)  Resp 20  Wt 66.2 kg (145 lb 15.1 oz)  SpO2 99% Physical Exam General: Older BM  NAD  OG tube in place Heart:Tachy 110 128 systolic Lungs:Rhonchous breath sounds Abdomen:Mod distension Fairly quiet Mildly tender Extremities:Cool; no edema whatsoever; left great toe a little dark/ischemic appearing Dialysis Access: Left upper arm AV fistula patent; non function AVF right  Additional Objective Labs: Basic Metabolic Panel:  Lab 08/14/11 1610 08/13/11 1611 08/13/11 1530  NA 141 141 144  K 4.8 4.4 4.7  CL 96 105 95*  CO2 25 -- 22  GLUCOSE 92 113* 112*  BUN 89* 76* 81*  CREATININE 8.77* 9.60* 8.91*  CALCIUM 9.2 -- 9.3  ALB -- -- --  PHOS 7.8* -- --   Liver Function Tests:  Lab 08/13/11 1530  AST 39*  ALT 12  ALKPHOS 64  BILITOT 0.5  PROT 5.9*  ALBUMIN 2.2*    Lab 08/13/11 1530  LIPASE 5*  AMYLASE --   Lab 08/14/11 0435 08/13/11 1611 08/13/11 1427  WBC 8.8 -- 12.7*  NEUTROABS -- -- 10.4*  HGB 9.0* 10.2* 11.4*  HCT 27.4* 30.0* 34.7*  MCV 84.8 -- 86.1  PLT 320 -- PLATELET CLUMPS NOTED ON SMEAR, COUNT APPEARS ADEQUATE  Cardiac Enzymes:  Lab 08/14/11 0435 08/13/11 2315  CKTOTAL 410* 614*  CKMB 5.7* 5.7*  CKMBINDEX -- --  TROPONINI <0.30 <0.30     Ref. Range 08/13/2011 16:21  Lactic Acid, Venous Latest Range: 0.5-2.2 mmol/L 4.7 (H)   Iron Studies: No results found for this basename: IRON,TIBC,TRANSFERRIN,FERRITIN in the last 72 hours Studies/Results: Ct Abdomen Pelvis Wo Contrast  08/13/2011   *RADIOLOGY REPORT*  Clinical Data: Abdominal pain.  Unresponsive.  CT ABDOMEN AND PELVIS WITHOUT CONTRAST  Technique:  Multidetector CT imaging of the abdomen and pelvis was performed following the standard protocol without intravenous contrast.  Comparison: None.  Findings: Lack of intravenous and oral contrast severely limits the exam.  Moderate bilateral pleural effusions and bibasilar consolidation. Coronary artery calcifications.  Aortic valvular calcifications.  Gallbladder is decompressed.  Liver is within normal limits.  Small amount of free fluid anterior to the liver.  The stomach is markedly distended.  Small bowel loops are also markedly distended.  Distal small bowel is decompressed.  There is a small amount of enteric contrast in the lower abdomen at the midline likely contained within a bowel loop.  Colon is also relatively decompressed.  Small amount of free fluid is seen layering in the pelvis.  The kidneys have a markedly abnormal appearance bilaterally.  The left kidney is enlarged and markedly deformed with areas of curvilinear calcification and mixed density.  Similar changes in the right kidney but to a lesser degree.  Underlying renal mass cannot be excluded.  Dense vascular calcifications are noted.  No obvious free intraperitoneal gas.  Metallic staples are seen in the anterior abdominal wall to the  left of midline in the lower abdomen.  Prominent degenerative changes of the hip joints there is a lytic area in  in the right pelvis, above the right acetabulum.  There is loss of cortex in the overlying bone.  There is also a lytic area in the posterior inferior right acetabulum on image 88.  These lytic areas are greater than one would expect with simple degenerative changes.  Aggressive process cannot be excluded.  Gas within venous structures near the right groin are likely iatrogenic.  IMPRESSION: Very limited exam.  Bilateral pleural effusions and bibasilar consolidation.  Small bowel  obstruction pattern.  The transition point is in the lower abdomen.  There are lytic lesions about the right acetabulum as described. An aggressive process such as metastatic disease or myeloma cannot be excluded.  MRI may be helpful.  Markedly abnormal appearance of the kidneys.  Renal mass cannot be excluded.  Original Report Authenticated By: Donavan Burnet, M.D.   Dg Chest 1 View  08/13/2011  *RADIOLOGY REPORT*  Clinical Data: Pneumonia  CHEST - 1 VIEW  Comparison: 08/13/2011  Findings: Diffuse airspace disease throughout the right lung. Small right pleural effusion.  Minimal linear atelectasis at the left base.  Cardiomegaly.  Airspace disease on the left has improved.  IMPRESSION: Improved left airspace disease.  Stable airspace disease throughout the right lung with a right pleural effusion.  Original Report Authenticated By: Donavan Burnet, M.D.  Medications:  . sodium chloride 20 mL/hr (08/14/11 0044)  . phenylephrine (NEO-SYNEPHRINE) Adult infusion 30 mcg/min (08/13/11 2300)  . vasopressin (PITRESSIN) infusion - *FOR SHOCK* 0.03 Units/min (08/13/11 1905)  . DISCONTD: sodium chloride     . ceFEPime (MAXIPIME) IV  1 g Intravenous Q24H  . dextrose      . dextrose      . famotidine (PEPCID) IV  20 mg Intravenous Q12H  . hydrocortisone sod succinate (SOLU-CORTEF) injection  50 mg Intravenous Q6H  . iohexol  20 mL Oral Q1 Hr x 2  . metronidazole  500 mg Intravenous Once  . metronidazole  500 mg Intravenous Q8H  . norepinephrine      . norepinephrine      . norepinephrine (LEVOPHED) Adult infusion  2-50 mcg/min Intravenous Once  . piperacillin-tazobactam (ZOSYN)  IV  3.375 g Intravenous Once  . sodium chloride  1,000 mL Intravenous Once  . sodium chloride  1,000 mL Intravenous Once  . sodium chloride  1,000 mL Intravenous Once  . sodium chloride  25 mL/kg Intravenous Once  . vancomycin  500 mg Intravenous NOW  . vancomycin  1,000 mg Intravenous Once  . vancomycin  1,000 mg Intravenous  Once  . DISCONTD: ceFEPime (MAXIPIME) IV  2 g Intravenous Once  . DISCONTD: hydrocortisone sodium succinate  50 mg Intrathecal Q6H    I  have reviewed scheduled and prn medications. Assessment/Plan:  1 ESRD - usual MWF [place].3 1/2 hours, EDW unk (Unit closed today [phone] - call for recs Friday).  Off pressors.  Will try conventional HD; CXR looks wet but he looks dry everywhere else.  1 liter goal and if any BP instability will keep vol even. 2 Small Bowel Obstruction with hypovolemia due to GI losses - per primary team 3 Shock - off pressors now; ATB's per primary service 4. Pulmonary Edema/ right pleural effusion - see #1 5. Lytic lesion right pelvis and abnormal appearance on CT of both kidneys - will need w/u 6 Peripheral Artery Disease 7. Anemia - no idea  of baseline Hb and cannot get info from HD unit.  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 AFib/flutter - per primary   08/14/2011,8:05 AM  LOS: 1 day           [facility] Progress Note  Subjective:   Objective Filed Vitals:   08/14/11 0400 08/14/11 0500 08/14/11 0600 08/14/11 0755  BP:      Pulse:  108 47   Temp:    98 F (36.7 C)  TempSrc:    Oral  Resp: 19 21 20    Weight: 66.2 kg (145 lb 15.1 oz)     SpO2: 100% 93% 99%    Physical Exam General: Heart: Lungs: Abdomen: Extremities: Dialysis Access:   Assessment/Plan: 1.  2. ESRD - 3. Anemia -  4. Secondary hyperparathyroidism -  5. HTN/volume -  6. Nutrition -   Additional Objective Labs: Basic Metabolic Panel:  Lab 08/14/11 1610 08/13/11 1611 08/13/11 1530  NA 141 141 144  K 4.8 4.4 4.7  CL 96 105 95*  CO2 25 -- 22  GLUCOSE 92 113* 112*  BUN 89* 76* 81*  CREATININE 8.77* 9.60* 8.91*  CALCIUM 9.2 -- 9.3  ALB -- -- --  PHOS 7.8* -- --   Liver Function Tests:  Lab 08/13/11 1530  AST 39*  ALT 12    ALKPHOS 64  BILITOT 0.5  PROT 5.9*  ALBUMIN 2.2*    Lab 08/13/11 1530  LIPASE 5*  AMYLASE --   No results found for this basename: AMMONIA:3 in the last 168 hours INR: No components found with this basename: INR3 CBC:  Lab 08/14/11 0435 08/13/11 1611 08/13/11 1427  WBC 8.8 -- 12.7*  NEUTROABS -- -- 10.4*  HGB 9.0* 10.2* 11.4*  HCT 27.4* 30.0* 34.7*  MCV 84.8 -- 86.1  PLT 320 -- PLATELET CLUMPS NOTED ON SMEAR, COUNT APPEARS ADEQUATE   Blood Culture No results found for this basename: sdes, specrequest, cult, reptstatus    Cardiac Enzymes:  Lab 08/14/11 0435 08/13/11 2315  CKTOTAL 410* 614*  CKMB 5.7* 5.7*  CKMBINDEX -- --  TROPONINI <0.30 <0.30   CBG:  Lab 08/13/11 1837  GLUCAP 176*   Iron Studies: No results found for this basename: IRON,TIBC,TRANSFERRIN,FERRITIN in the last 72 hours Studies/Results: Ct Abdomen Pelvis Wo Contrast  08/13/2011  *RADIOLOGY REPORT*  Clinical Data: Abdominal pain.  Unresponsive.  CT ABDOMEN AND PELVIS WITHOUT CONTRAST  Technique:  Multidetector CT imaging of the abdomen and pelvis was performed following the standard protocol without intravenous contrast.  Comparison: None.  Findings: Lack of intravenous and oral contrast severely limits the exam.  Moderate bilateral pleural effusions and bibasilar consolidation. Coronary artery calcifications.  Aortic valvular calcifications.  Gallbladder is decompressed.  Liver is within normal limits.  Small amount of free fluid anterior to the liver.  The stomach is markedly distended.  Small bowel loops are also markedly distended.  Distal small bowel is decompressed.  There is a small amount of enteric contrast in the lower abdomen at the midline likely contained within a bowel loop.  Colon is also relatively decompressed.  Small amount of free fluid is seen layering in the pelvis.  The kidneys have a markedly abnormal appearance bilaterally.  The left kidney is enlarged and markedly deformed with areas  of curvilinear calcification and mixed density.  Similar changes  in the right kidney but to a lesser degree.  Underlying renal mass cannot be excluded.  Dense vascular calcifications are noted.  No obvious free intraperitoneal gas.  Metallic staples are seen in the anterior abdominal wall to the left of midline in the lower abdomen.  Prominent degenerative changes of the hip joints there is a lytic area in  in the right pelvis, above the right acetabulum.  There is loss of cortex in the overlying bone.  There is also a lytic area in the posterior inferior right acetabulum on image 88.  These lytic areas are greater than one would expect with simple degenerative changes.  Aggressive process cannot be excluded.  Gas within venous structures near the right groin are likely iatrogenic.  IMPRESSION: Very limited exam.  Bilateral pleural effusions and bibasilar consolidation.  Small bowel obstruction pattern.  The transition point is in the lower abdomen.  There are lytic lesions about the right acetabulum as described. An aggressive process such as metastatic disease or myeloma cannot be excluded.  MRI may be helpful.  Markedly abnormal appearance of the kidneys.  Renal mass cannot be excluded.  Original Report Authenticated By: Donavan Burnet, M.D.   Dg Chest 1 View  08/13/2011  *RADIOLOGY REPORT*  Clinical Data: Pneumonia  CHEST - 1 VIEW  Comparison: 08/13/2011  Findings: Diffuse airspace disease throughout the right lung. Small right pleural effusion.  Minimal linear atelectasis at the left base.  Cardiomegaly.  Airspace disease on the left has improved.  IMPRESSION: Improved left airspace disease.  Stable airspace disease throughout the right lung with a right pleural effusion.  Original Report Authenticated By: Donavan Burnet, M.D.   Dg Chest Port 1 View  08/13/2011  *RADIOLOGY REPORT*  Clinical Data: Unresponsive.  Shortness of breath and abdominal distention.  PORTABLE CHEST - 1 VIEW  Comparison: 07/31/2011.   Findings: Trachea is midline.  Heart size stable.  There is bilateral air space disease, right greater than left, with progression from 07/31/2011.  Associated right pleural effusion. There may be a tiny left pleural effusion.  IMPRESSION: Worsening bilateral air space disease with an associated right pleural effusion.  Question tiny left pleural effusion.  Congestive heart failure is likely.  Original Report Authenticated By: [name], M.D.   Dg Abd Portable 1v  08/13/2011  *RADIOLOGY REPORT*  Clinical Data: Unresponsive.  Shortness of breath and abdominal distention.  PORTABLE ABDOMEN - 1 VIEW  Comparison: None.  Findings: There is mild gaseous distention of small bowel loops. Some colonic gas and stool are seen.  Surgical skin staples overlie the left lower quadrant.  Peripherally calcified structure in the left paraspinal region has the shape of the left kidney.  Additional tubular calcifications are seen in the left upper quadrant.  There may be small calcifications in the right paraspinal region.  IMPRESSION:  1.  Mild small bowel distention with possible wall thickening.  CT abdomen pelvis with contrast would likely be helpful in further evaluation, as clinically indicated. 2.  Calcified structures in the left upper quadrant, and likely right paraspinal region, as discussed above.  These could also be further assessed with CT.  Original Report Authenticated By: [name], M.D.   Medications:    . sodium chloride 20 mL/hr (08/14/11 0044)  . phenylephrine (NEO-SYNEPHRINE) Adult infusion 30 mcg/min (08/13/11 2300)  . vasopressin (PITRESSIN) infusion - *FOR SHOCK* 0.03 Units/min (08/13/11 1905)  . DISCONTD: sodium chloride        . ceFEPime (MAXIPIME)  IV  1 g Intravenous Q24H  . dextrose      . dextrose      . famotidine (PEPCID) IV  20 mg Intravenous Q12H  . hydrocortisone sod succinate (SOLU-CORTEF) injection  50 mg Intravenous Q6H  . iohexol  20 mL Oral Q1 Hr x 2  .  metronidazole  500 mg Intravenous Once  . metronidazole  500 mg Intravenous Q8H  . norepinephrine      . norepinephrine      . norepinephrine (LEVOPHED) Adult infusion  2-50 mcg/min Intravenous Once  . piperacillin-tazobactam (ZOSYN)  IV  3.375 g Intravenous Once  . sodium chloride  1,000 mL Intravenous Once  . sodium chloride  1,000 mL Intravenous Once  . sodium chloride  1,000 mL Intravenous Once  . sodium chloride  25 mL/kg Intravenous Once  . vancomycin  500 mg Intravenous NOW  . vancomycin  1,000 mg Intravenous Once  . vancomycin  1,000 mg Intravenous Once  . DISCONTD: ceFEPime (MAXIPIME) IV  2 g Intravenous Once  . DISCONTD: hydrocortisone sodium succinate  50 mg Intrathecal Q6H    I  have reviewed scheduled and prn medications.  @MBSIG @  08/14/2011,8:05 AM  LOS: 1 day

## 2011-08-14 NOTE — Progress Notes (Signed)
Patient in new afib, rate controlled. MD Deterding made aware. New PRN lopressor order. Will continue to monitor.  [name]

## 2011-08-15 ENCOUNTER — Inpatient Hospital Stay (HOSPITAL_COMMUNITY): Payer: Medicare Other

## 2011-08-15 ENCOUNTER — Encounter: Payer: Medicare Other | Admitting: Vascular Surgery

## 2011-08-15 ENCOUNTER — Ambulatory Visit (HOSPITAL_COMMUNITY): Admission: RE | Admit: 2011-08-15 | Payer: Medicare Other | Source: Ambulatory Visit | Admitting: Surgery

## 2011-08-15 ENCOUNTER — Encounter (HOSPITAL_COMMUNITY): Admission: RE | Payer: Self-pay | Source: Ambulatory Visit

## 2011-08-15 DIAGNOSIS — I517 Cardiomegaly: Secondary | ICD-10-CM

## 2011-08-15 DIAGNOSIS — A419 Sepsis, unspecified organism: Secondary | ICD-10-CM

## 2011-08-15 LAB — GLUCOSE, CAPILLARY
Glucose-Capillary: 94 mg/dL (ref 70–99)
Glucose-Capillary: 91 mg/dL (ref 70–99)

## 2011-08-15 LAB — CBC
Hemoglobin: 8.3 g/dL — ABNORMAL LOW (ref 13.0–17.0)
MCH: 27.7 pg (ref 26.0–34.0)
MCV: 86 fL (ref 78.0–100.0)
RBC: 3 MIL/uL — ABNORMAL LOW (ref 4.22–5.81)
WBC: 14.2 10*3/uL — ABNORMAL HIGH (ref 4.0–10.5)

## 2011-08-15 LAB — LACTATE DEHYDROGENASE, PLEURAL OR PERITONEAL FLUID: LD, Fluid: 155 U/L — ABNORMAL HIGH (ref 3–23)

## 2011-08-15 LAB — BODY FLUID CELL COUNT WITH DIFFERENTIAL
Lymphs, Fluid: 5 %
Neutrophil Count, Fluid: 39 % — ABNORMAL HIGH (ref 0–25)
Total Nucleated Cell Count, Fluid: 214 cu mm (ref 0–1000)

## 2011-08-15 LAB — GLUCOSE, SEROUS FLUID: Glucose, Fluid: <20 mg/dL

## 2011-08-15 LAB — RENAL FUNCTION PANEL
BUN: 56 mg/dL — ABNORMAL HIGH (ref 6–23)
CO2: 25 mEq/L (ref 19–32)
Chloride: 100 mEq/L (ref 96–112)
Creatinine, Ser: 5.96 mg/dL — ABNORMAL HIGH (ref 0.50–1.35)

## 2011-08-15 SURGERY — BYPASS GRAFT FEMORAL-POPLITEAL ARTERY
Anesthesia: General | Site: Leg Upper | Laterality: Left

## 2011-08-15 MED ORDER — SODIUM CHLORIDE 0.9 % IV SOLN
125.0000 mg | Freq: Once | INTRAVENOUS | Status: AC
  Administered 2011-08-16: 125 mg via INTRAVENOUS
  Filled 2011-08-15 (×2): qty 10

## 2011-08-15 MED ORDER — DARBEPOETIN ALFA-POLYSORBATE 60 MCG/0.3ML IJ SOLN
60.0000 ug | INTRAMUSCULAR | Status: DC
  Filled 2011-08-15: qty 0.3

## 2011-08-15 MED ORDER — SODIUM CHLORIDE 0.9 % IV SOLN
125.0000 mg | INTRAVENOUS | Status: DC
  Administered 2011-08-22: 125 mg via INTRAVENOUS
  Filled 2011-08-15 (×3): qty 10

## 2011-08-15 NOTE — Progress Notes (Signed)
Subjective: stable off pressors Still has OG tube with fair amount of drainage Abd not hurting  Objective Vital signs in last 24 hours: Filed Vitals:   08/15/11 0600 08/15/11 0700 08/15/11 0830 08/15/11 1134  BP:      Pulse: 58 85    Temp:   97.8 F (36.6 C) 98.5 F (36.9 C)  TempSrc:   Oral Oral  Resp: 21 16    Weight:      SpO2: 87% 100%     Weight change: -3.715 kg (-8 lb 3 oz)  Intake/Output Summary (Last 24 hours) at 08/15/11 1322 Last data filed at 08/15/11 1001  Gross per 24 hour  Intake    980 ml  Output    952 ml  Net     28 ml   Labs: Basic Metabolic Panel:  Lab 08/15/11 1610 08/14/11 0435 08/13/11 1611 08/13/11 1530  NA 142 141 141 144  K 4.2 4.8 4.4 4.7  CL 100 96 105 95*  CO2 25 25 -- 22  GLUCOSE 97 92 113* 112*  BUN 56* 89* 76* 81*  CREATININE 5.96* 8.77* 9.60* 8.91*  ALB -- -- -- --  CALCIUM 9.3 9.2 -- 9.3  PHOS 6.1* 7.8* -- --   Liver Function Tests:  Lab 08/15/11 0430 08/13/11 1530  AST -- 39*  ALT -- 12  ALKPHOS -- 64  BILITOT -- 0.5  PROT -- 5.9*  ALBUMIN 2.0* 2.2*    Lab 08/13/11 1530  LIPASE 5*  AMYLASE --   No results found for this basename: AMMONIA:3 in the last 168 hours CBC:  Lab 08/15/11 0430 08/14/11 0435 08/13/11 1611 08/13/11 1427  WBC 14.2* 8.8 -- 12.7*  NEUTROABS -- -- -- 10.4*  HGB 8.3* 9.0* 10.2* 11.4*  HCT 25.8* 27.4* 30.0* 34.7*  MCV 86.0 84.8 -- 86.1  PLT 270 320 -- PLATELET CLUMPS NOTED ON SMEAR, COUNT APPEARS ADEQUATE   PT/INR: @labrcntip (inr:5) Cardiac Enzymes:  Lab 08/14/11 1505 08/14/11 0435 08/13/11 2315  CKTOTAL 254* 410* 614*  CKMB 4.4* 5.7* 5.7*  CKMBINDEX -- -- --  TROPONINI <0.30 <0.30 <0.30   CBG:  Lab 08/15/11 0348 08/14/11 2348 08/14/11 1957 08/14/11 1546 08/13/11 1837  GLUCAP 94 97 89 78 176*    Iron Studies:  Lab 08/14/11 1221  IRON 15*  TIBC 78*  TRANSFERRIN --  FERRITIN 3239*    08/15/2011  *RADIOLOGY REPORT*  Clinical Data: Shortness of breath and pleural effusion.   PORTABLE CHEST - 1 VIEW  Comparison: 08/14/2011  Findings: The NG tube passes into the stomach although the distal tip position is not included on the film. The cardiopericardial silhouette is enlarged.  Vascular congestion with diffuse interstitial opacity is consistent with edema.  There is bibasilar atelectasis and right pleural effusion.  IMPRESSION: No substantial change.  Cardiomegaly with edema and right pleural effusion.  Original Report Authenticated By: ERIC A. MANSELL, M.D.   Results for [name], [name] (MRN [id number]) as of 08/15/2011 13:24  Ref. Range 08/13/2011 22:46  C difficile by pcr Latest Range: NEGATIVE  POSITIVE (A)    Medications:    . sodium chloride 20 mL/hr (08/14/11 0044)   . ceFEPime (MAXIPIME) IV  1 g Intravenous Q24H  . famotidine (PEPCID) IV  20 mg Intravenous Q12H  . insulin aspart  0-9 Units Subcutaneous Q4H  . metronidazole  500 mg Intravenous Q8H  . sodium chloride  25 mL/kg Intravenous Once  . vancomycin  500 mg Intravenous NOW  . vancomycin  750  mg Intravenous Once  . vancomycin  1,000 mg Intravenous Once    I  have reviewed scheduled and prn medications.  Physical Exam:  Blood pressure 121/45, pulse 85, temperature 98.5 F (36.9 C), temperature source Oral, resp. rate 16, weight 65.7 kg (144 lb 13.5 oz), SpO2 100.00%. General - no distress appears comfortable HEENT- OG tube in place draining Cardiac - irregular, no murmur afib on tele Chest - basilar rales, decreased bs Abd - distended, increased tympany, no bowel sounds  Ext - no edema whatsoever; ischemic appearing left great toe Neuro - alert, awake, follows commands  Assessment/Plan:   1.  ESRD - usual MWF [place].3 1/2 hours, EDW unk I have attempted to call his dialysis unit today and get no answer at the [phone] number. Off pressors. Next HD tomorrow then to MWF first of the week. No volume off 2 Small Bowel Obstruction with hypovolemia due to GI losses - per primary team; volume about  where needs to be now  3 Shock - off pressors now; ATB's per primary service  4. Pulmonary Edema/ right pleural effusion - CXR stable 5. Lytic lesion right pelvis and abnormal appearance on CT of both kidneys - will need w/u  6 Peripheral Artery Disease - keep eye on left toe 7. Anemia - no idea of baseline Hb and cannot get info from HD unit. 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 AFib/flutter - per primary 10. CDiff positive-on vanco and flagyl  Camille Bal, MD [facility] [phone] Pager 08/15/2011, 1:22 PM

## 2011-08-15 NOTE — Procedures (Signed)
Thoracentesis Procedure Note  Pre-operative Diagnosis: Right Pleural effusion  Post-operative Diagnosis: same  Indications: SOB  Procedure Details  Consent: Informed consent was obtained. Risks of the procedure were discussed including: infection, bleeding, pain, pneumothorax.  Under sterile conditions the patient was positioned. Betadine solution and sterile drapes were utilized.  1% buffered lidocaine was used to anesthetize the 7rib space. Fluid was obtained without any difficulties and minimal blood loss.  A dressing was applied to the wound and wound care instructions were provided.   Findings 800 ml of clear pleural fluid was obtained. A sample was sent to Pathology for cytogenetics, flow, and cell counts, as well as for infection analysis.  Complications:  None; patient tolerated the procedure well.          Condition: stable  Plan A follow up chest x-ray was ordered. Bed Rest for 2 hours.   Attending Attestation: I was present and scrubbed for the entire procedure.  Coralyn Helling, MD 08/15/2011, 3:58 PM Pager:  [phone]

## 2011-08-15 NOTE — Progress Notes (Signed)
*  PRELIMINARY RESULTS* Echocardiogram 2D Echocardiogram has been performed.  Katheren Puller 08/15/2011, 11:56 AM

## 2011-08-15 NOTE — Progress Notes (Signed)
Name: [name] MRN: [id number] DOB: 1950/06/23    LOS: 2  Referring Provider:  EDP Reason for Referral:  Septic shock   PULMONARY / CRITICAL CARE MEDICINE  HPI:  61 yo male former smoker found at home with lethargy, covered in stool/vomit and admitted 08/13/2011 with acute encephalopathy, hypotension.  Last seen 5/20.  Found to have discrepancy between aline pressure readings and cuff pressure readings.  PCCM asked to admit with concern for septic shock. PMHx ESRD on HD, CVA, Diastolic CHF, PVD, HTN  Subjective: Off pressors.  More alert.  Decreased cough.  Denies chest pain.  Still bloated, not passing flatus.  700 ml from OG tube.   Vital Signs: Temp:  [97.8 F (36.6 C)-98.9 F (37.2 C)] 97.8 F (36.6 C) (05/24 0830) Pulse Rate:  [30-138] 85  (05/24 0700) Resp:  [13-21] 16  (05/24 0700) BP: (83-124)/(37-62) 121/45 mmHg (05/23 1800) SpO2:  [87 %-100 %] 100 % (05/24 0700) Arterial Line BP: (93-165)/(35-65) 135/54 mmHg (05/24 0700) Weight:  [144 lb 13.5 oz (65.7 kg)-148 lb 13 oz (67.5 kg)] 144 lb 13.5 oz (65.7 kg) (05/24 0500)   Intake/Output Summary (Last 24 hours) at 08/15/11 0954 Last data filed at 08/15/11 0800  Gross per 24 hour  Intake    870 ml  Output    952 ml  Net    -82 ml    Physical Examination: General - no distress HEENT- OG tube in place Cardiac - irregular, no murmur Chest - basilar rales, scattered rhonchi, no wheeze Abd - distended, increased tympany, no bowel sounds Ext - no edema Neuro - alert, follows commands  Ct Abdomen Pelvis Wo Contrast  08/13/2011  *RADIOLOGY REPORT*  Clinical Data: Abdominal pain.  Unresponsive.  CT ABDOMEN AND PELVIS WITHOUT CONTRAST  Technique:  Multidetector CT imaging of the abdomen and pelvis was performed following the standard protocol without intravenous contrast.  Comparison: None.  Findings: Lack of intravenous and oral contrast severely limits the exam.  Moderate bilateral pleural effusions and bibasilar  consolidation. Coronary artery calcifications.  Aortic valvular calcifications.  Gallbladder is decompressed.  Liver is within normal limits.  Small amount of free fluid anterior to the liver.  The stomach is markedly distended.  Small bowel loops are also markedly distended.  Distal small bowel is decompressed.  There is a small amount of enteric contrast in the lower abdomen at the midline likely contained within a bowel loop.  Colon is also relatively decompressed.  Small amount of free fluid is seen layering in the pelvis.  The kidneys have a markedly abnormal appearance bilaterally.  The left kidney is enlarged and markedly deformed with areas of curvilinear calcification and mixed density.  Similar changes in the right kidney but to a lesser degree.  Underlying renal mass cannot be excluded.  Dense vascular calcifications are noted.  No obvious free intraperitoneal gas.  Metallic staples are seen in the anterior abdominal wall to the left of midline in the lower abdomen.  Prominent degenerative changes of the hip joints there is a lytic area in  in the right pelvis, above the right acetabulum.  There is loss of cortex in the overlying bone.  There is also a lytic area in the posterior inferior right acetabulum on image 88.  These lytic areas are greater than one would expect with simple degenerative changes.  Aggressive process cannot be excluded.  Gas within venous structures near the right groin are likely iatrogenic.  IMPRESSION: Very limited exam.  Bilateral  pleural effusions and bibasilar consolidation.  Small bowel obstruction pattern.  The transition point is in the lower abdomen.  There are lytic lesions about the right acetabulum as described. An aggressive process such as metastatic disease or myeloma cannot be excluded.  MRI may be helpful.  Markedly abnormal appearance of the kidneys.  Renal mass cannot be excluded.  Original Report Authenticated By: Donavan Burnet, M.D.   Dg Chest 1  View  08/13/2011  *RADIOLOGY REPORT*  Clinical Data: Pneumonia  CHEST - 1 VIEW  Comparison: 08/13/2011  Findings: Diffuse airspace disease throughout the right lung. Small right pleural effusion.  Minimal linear atelectasis at the left base.  Cardiomegaly.  Airspace disease on the left has improved.  IMPRESSION: Improved left airspace disease.  Stable airspace disease throughout the right lung with a right pleural effusion.  Original Report Authenticated By: Donavan Burnet, M.D.   Dg Chest Port 1 View  08/15/2011  *RADIOLOGY REPORT*  Clinical Data: Shortness of breath and pleural effusion.  PORTABLE CHEST - 1 VIEW  Comparison: 08/14/2011  Findings: The NG tube passes into the stomach although the distal tip position is not included on the film. The cardiopericardial silhouette is enlarged.  Vascular congestion with diffuse interstitial opacity is consistent with edema.  There is bibasilar atelectasis and right pleural effusion.  IMPRESSION: No substantial change.  Cardiomegaly with edema and right pleural effusion.  Original Report Authenticated By: ERIC A. MANSELL, M.D.   Portable Chest Xray In Am  08/14/2011  *RADIOLOGY REPORT*  Clinical Data: Respiratory failure  PORTABLE CHEST - 1 VIEW  Comparison: Aug 13, 2011  Findings: There is cardiomegaly and pulmonary edema, as well as a moderate sized right pleural effusion.  Overall, the findings  have not significantly changed.  The nasogastric tube tip courses off the inferior film.  IMPRESSION: No significant change in cardiomegaly, pulmonary edema, moderate sized right pleural effusion.  Original Report Authenticated By: Brandon Melnick, M.D.   Dg Chest Port 1 View  08/13/2011  *RADIOLOGY REPORT*  Clinical Data: Unresponsive.  Shortness of breath and abdominal distention.  PORTABLE CHEST - 1 VIEW  Comparison: 07/31/2011.  Findings: Trachea is midline.  Heart size stable.  There is bilateral air space disease, right greater than left, with progression from  07/31/2011.  Associated right pleural effusion. There may be a tiny left pleural effusion.  IMPRESSION: Worsening bilateral air space disease with an associated right pleural effusion.  Question tiny left pleural effusion.  Congestive heart failure is likely.  Original Report Authenticated By: Reyes Ivan, M.D.   Dg Abd Portable 1v  08/13/2011  *RADIOLOGY REPORT*  Clinical Data: Unresponsive.  Shortness of breath and abdominal distention.  PORTABLE ABDOMEN - 1 VIEW  Comparison: None.  Findings: There is mild gaseous distention of small bowel loops. Some colonic gas and stool are seen.  Surgical skin staples overlie the left lower quadrant.  Peripherally calcified structure in the left paraspinal region has the shape of the left kidney.  Additional tubular calcifications are seen in the left upper quadrant.  There may be small calcifications in the right paraspinal region.  IMPRESSION:  1.  Mild small bowel distention with possible wall thickening.  CT abdomen pelvis with contrast would likely be helpful in further evaluation, as clinically indicated. 2.  Calcified structures in the left upper quadrant, and likely right paraspinal region, as discussed above.  These could also be further assessed with CT.  Original Report Authenticated By: Reyes Ivan, M.D.  CBC    Component Value Date/Time   WBC 14.2* 08/15/2011 0430   RBC 3.00* 08/15/2011 0430   HGB 8.3* 08/15/2011 0430   HCT 25.8* 08/15/2011 0430   PLT 270 08/15/2011 0430   MCV 86.0 08/15/2011 0430   MCH 27.7 08/15/2011 0430   MCHC 32.2 08/15/2011 0430   RDW 17.8* 08/15/2011 0430   LYMPHSABS 1.3 08/13/2011 1427   MONOABS 0.9 08/13/2011 1427   EOSABS 0.0 08/13/2011 1427   BASOSABS 0.1 08/13/2011 1427    BMET    Component Value Date/Time   NA 142 08/15/2011 0430   K 4.2 08/15/2011 0430   CL 100 08/15/2011 0430   CO2 25 08/15/2011 0430   GLUCOSE 97 08/15/2011 0430   BUN 56* 08/15/2011 0430   CREATININE 5.96* 08/15/2011 0430   CALCIUM 9.3  08/15/2011 0430   GFRNONAA 9* 08/15/2011 0430   GFRAA 11* 08/15/2011 0430    Lab Results  Component Value Date   ALT 12 08/13/2011   AST 39* 08/13/2011   ALKPHOS 64 08/13/2011   BILITOT 0.5 08/13/2011   ASSESSMENT AND PLAN  PULMONARY  Acute hypoxic respiratory failure.  Likely 2nd to PNA, pleural effusion. Plan: -f/u CXR and decide if thoracentesis needed -titrate oxygen to keep SpO2 > 92%   CARDIOVASCULAR  5/08 Myoview>>fixed basal to mid-inferior perfusion defect, EF 44% 5/10 cardiac cath>>Single vessel obstructive CAD. Moderate nonobstructive plaque elsewhere. Mild LV dysfunction.  Rt femoral CVL (ER) 5/22>>> Rt femoral Aline 5/22>>>  ?Septic shock>>marked difference between aline and cuff pressure readings.  Procalcitonin 103 from 5/22.  Quickly weaned off pressors after aline placed. Plan: -monitor BP off pressors -keep in even fluid balance  Chronic Systolic/diastolic CHF, PVD. Plan: -check Echo  New onset A fib 5/23. Plan: -appreciate help from cardiology -prn metoprolol -will need to add coumadin when more stable   RENAL  ESRD on HD.  Gets outpt HD in [place] MWF. Plan: -HD per renal -f/u renal panel -goal even fluid balance   GASTROINTESTINAL  5/22 CT abd/pelvis>>mod b/l pleural effusions, lytic lesion Rt pelvis and Rt acetabulum, SBO, ?renal mass  SBO. Plan: -keep NPO -f/u KUB -OG tube to suction -if no improvement may need surgical evaluation   HEMATOLOGY/ONCOLOGY  Anemia of chronic disease, renal failure, and critical illness. Plan: -f/u CBC -transfuse for Hb < 7  ?lytic lesions Rt pelvis, Rt acetabulum with ?renal mass. Plan: -will need MRI when more stable to further assess  INFECTIOUS  Cultures: BCx2 5/22>>> Sputum 5/22>>> C diff PCR 5/22>>POSITIVE  Antibiotics: Vanc 5/22>>> Cefepime 5/22>>> Flagyl 5/22>>>  Sepsis.  C dif positive, possible pneumonia. Plan: -D 3/x cefepime, vancomycin>>continue for now pending culture  results -D 3/x flagyl for C diff -f/u procalcitonin 5/25   ENDOCRINE  Lab 08/15/11 0348 08/14/11 2348 08/14/11 1957 08/14/11 1546 08/13/11 1837  GLUCAP 94 97 89 78 176*   Hyperglycemia.  No hx of DM. Plan: -SSI   NEUROLOGIC  Acute metabolic encephalopathy.  Resolved 5/24.   BEST PRACTICE / DISPOSITION -Pepcid for SUP -SCD for DVT prophylaxis -Full code  Critical care time 40 minutes.  Coralyn Helling, MD 08/15/2011, 9:54 AM Pager:  [phone]

## 2011-08-15 NOTE — Progress Notes (Signed)
Patient unable to tolerate NG tube, projectile emesis due to gag reflex. Estimated 1.5L projectile emesis over 20 minutes.  OG tube able to be placed instead. Patient tolerated with 500cc thin brown output. Doree Albee

## 2011-08-15 NOTE — Progress Notes (Signed)
Patient ID: [name], male   DOB: May 20, 1950, 61 y.o.   MRN: [id number] Chart reviewed. Rate well controlled. Echo pending. Will follow.

## 2011-08-16 ENCOUNTER — Inpatient Hospital Stay (HOSPITAL_COMMUNITY): Payer: Medicare Other

## 2011-08-16 ENCOUNTER — Encounter (HOSPITAL_COMMUNITY): Payer: Self-pay | Admitting: *Deleted

## 2011-08-16 LAB — RENAL FUNCTION PANEL
GFR calc Af Amer: 8 mL/min — ABNORMAL LOW (ref >90)
GFR calc non Af Amer: 7 mL/min — ABNORMAL LOW (ref >90)
Glucose, Bld: 133 mg/dL — ABNORMAL HIGH (ref 70–99)
Phosphorus: 5.4 mg/dL — ABNORMAL HIGH (ref 2.3–4.6)
Potassium: 4.1 mEq/L (ref 3.5–5.1)
Sodium: 141 mEq/L (ref 135–145)

## 2011-08-16 LAB — CBC
Hemoglobin: 9.3 g/dL — ABNORMAL LOW (ref 13.0–17.0)
MCH: 27.6 pg (ref 26.0–34.0)
MCHC: 32.3 g/dL (ref 30.0–36.0)
MCV: 87.2 fL (ref 78.0–100.0)
Platelets: 247 10*3/uL (ref 150–400)
Platelets: 197 10*3/uL (ref 150–400)
RBC: 3.12 MIL/uL — ABNORMAL LOW (ref 4.22–5.81)
RBC: 3.28 MIL/uL — ABNORMAL LOW (ref 4.22–5.81)
RDW: 17.9 % — ABNORMAL HIGH (ref 11.5–15.5)
WBC: 21.8 10*3/uL — ABNORMAL HIGH (ref 4.0–10.5)

## 2011-08-16 LAB — MAGNESIUM: Magnesium: 2 mg/dL (ref 1.5–2.5)

## 2011-08-16 LAB — GLUCOSE, CAPILLARY
Glucose-Capillary: 100 mg/dL — ABNORMAL HIGH (ref 70–99)
Glucose-Capillary: 102 mg/dL — ABNORMAL HIGH (ref 70–99)
Glucose-Capillary: 117 mg/dL — ABNORMAL HIGH (ref 70–99)
Glucose-Capillary: 77 mg/dL (ref 70–99)
Glucose-Capillary: 83 mg/dL (ref 70–99)

## 2011-08-16 LAB — COMPREHENSIVE METABOLIC PANEL
Albumin: 1.9 g/dL — ABNORMAL LOW (ref 3.5–5.2)
Alkaline Phosphatase: 96 U/L (ref 39–117)
BUN: 69 mg/dL — ABNORMAL HIGH (ref 6–23)
CO2: 22 mEq/L (ref 19–32)
Chloride: 102 mEq/L (ref 96–112)
Creatinine, Ser: 7.27 mg/dL — ABNORMAL HIGH (ref 0.50–1.35)
GFR calc non Af Amer: 7 mL/min — ABNORMAL LOW (ref >90)
Potassium: 4.1 mEq/L (ref 3.5–5.1)
Total Bilirubin: 0.3 mg/dL (ref 0.3–1.2)

## 2011-08-16 LAB — PROCALCITONIN: Procalcitonin: 76.42 ng/mL

## 2011-08-16 LAB — CULTURE, BLOOD (ROUTINE X 2): Culture  Setup Time: 201305230215

## 2011-08-16 MED ORDER — DEXTROSE 10 % IV SOLN
INTRAVENOUS | Status: DC
  Administered 2011-08-16: 40 mL/h via INTRAVENOUS

## 2011-08-16 MED ORDER — METRONIDAZOLE 500 MG PO TABS
500.0000 mg | ORAL_TABLET | Freq: Three times a day (TID) | ORAL | Status: DC
  Administered 2011-08-16 – 2011-08-23 (×19): 500 mg via ORAL
  Filled 2011-08-16 (×24): qty 1

## 2011-08-16 MED ORDER — VANCOMYCIN HCL 1000 MG IV SOLR
750.0000 mg | Freq: Once | INTRAVENOUS | Status: AC
  Administered 2011-08-16: 750 mg via INTRAVENOUS
  Filled 2011-08-16 (×2): qty 750

## 2011-08-16 MED ORDER — DEXTROSE 50 % IV SOLN
INTRAVENOUS | Status: AC
  Administered 2011-08-16: 25 mL
  Filled 2011-08-16: qty 50

## 2011-08-16 MED ORDER — FAMOTIDINE 20 MG PO TABS
20.0000 mg | ORAL_TABLET | Freq: Every day | ORAL | Status: DC
  Administered 2011-08-16 – 2011-08-20 (×4): 20 mg via ORAL
  Filled 2011-08-16 (×6): qty 1

## 2011-08-16 NOTE — Progress Notes (Signed)
Called dr. Arlean Hopping after system clotted; also notified increased heart rate in the 130s-150s; and patient's attempts to get out of bed. MD ordered to recannulate pt and give a bolus of 500cc and repeat if heart rate stay in the 120's. MD also notified of clot noted at the insertion sites of both the arterial and venous needles.  recannulated pt but unable to  run tx; NS bolus not given, Dr. Arlean Hopping notified after another RN cannulated but was unsuccessful in resuming; MD ordered to take pt to his room and try again another time. Heart rate between 110-120.

## 2011-08-16 NOTE — Progress Notes (Signed)
eLink Physician-Brief Progress Note Patient Name: [name] DOB: 10/09/1950 MRN: [id number]  Date of Service  08/16/2011   HPI/Events of Note  cbg 68   eICU Interventions  Start D10   Intervention Category Intermediate Interventions: Other:  Thermon Zulauf V. 08/16/2011, 4:00 AM

## 2011-08-16 NOTE — Progress Notes (Signed)
Patient being transferred to room 6703 to a camera monitored room. Patient is alert, oriented to person and place.Confused to situation and time. Family present at bedside. Oriented to call light system. Will place alarm on bed.

## 2011-08-16 NOTE — Progress Notes (Signed)
Subjective: Pulled NG out, wants to eat. Formed stool last night. No complaints.  Objective Vital signs in last 24 hours: Filed Vitals:   08/16/11 0900 08/16/11 0940 08/16/11 0950 08/16/11 1100  BP:  54/37 90/49   Pulse: 38 128 42 64  Temp:      TempSrc:      Resp: 14 24 15 15   Height:      Weight:      SpO2: 85% 85% 80% 95%   Weight change: -2.1 kg (-4 lb 10.1 oz)  Intake/Output Summary (Last 24 hours) at 08/16/11 1159 Last data filed at 08/16/11 0900  Gross per 24 hour  Intake    780 ml  Output   1300 ml  Net   -520 ml   Labs: Basic Metabolic Panel:  Lab 08/16/11 1610 08/15/11 0430 08/14/11 0435 08/13/11 1611 08/13/11 1530  NA 143 142 141 141 144  K 4.1 4.2 4.8 4.4 4.7  CL 102 100 96 105 95*  CO2 22 25 25  -- 22  GLUCOSE 141* 97 92 113* 112*  BUN 69* 56* 89* 76* 81*  CREATININE 7.27* 5.96* 8.77* 9.60* 8.91*  ALB -- -- -- -- --  CALCIUM 9.2 9.3 9.2 -- 9.3  PHOS 5.8* 6.1* 7.8* -- --   Liver Function Tests:  Lab 08/16/11 0430 08/15/11 0430 08/13/11 1530  AST 19 -- 39*  ALT 10 -- 12  ALKPHOS 96 -- 64  BILITOT 0.3 -- 0.5  PROT 5.0* -- 5.9*  ALBUMIN 1.9* 2.0* 2.2*    Lab 08/13/11 1530  LIPASE 5*  AMYLASE --   No results found for this basename: AMMONIA:3 in the last 168 hours CBC:  Lab 08/16/11 0430 08/15/11 0430 08/14/11 0435 08/13/11 1611 08/13/11 1427  WBC 21.8* 14.2* 8.8 -- 12.7*  NEUTROABS -- -- -- -- 10.4*  HGB 8.6* 8.3* 9.0* 10.2* --  HCT 27.2* 25.8* 27.4* 30.0* --  MCV 87.2 86.0 84.8 -- 86.1  PLT 247 270 320 -- PLATELET CLUMPS NOTED ON SMEAR, COUNT APPEARS ADEQUATE   PT/INR: @labrcntip (inr:5) Cardiac Enzymes:  Lab 08/14/11 1505 08/14/11 0435 08/13/11 2315  CKTOTAL 254* 410* 614*  CKMB 4.4* 5.7* 5.7*  CKMBINDEX -- -- --  TROPONINI <0.30 <0.30 <0.30   CBG:  Lab 08/16/11 0722 08/16/11 0510 08/16/11 0350 08/15/11 2339 08/15/11 2020  GLUCAP 117* 102* 68* 83 91    Iron Studies:   Lab 08/14/11 1221  IRON 15*  TIBC 78*  TRANSFERRIN --    FERRITIN 3239*    08/15/2011  *RADIOLOGY REPORT*  Clinical Data: Shortness of breath and pleural effusion.  PORTABLE CHEST - 1 VIEW  Comparison: 08/14/2011  Findings: The NG tube passes into the stomach although the distal tip position is not included on the film. The cardiopericardial silhouette is enlarged.  Vascular congestion with diffuse interstitial opacity is consistent with edema.  There is bibasilar atelectasis and right pleural effusion.  IMPRESSION: No substantial change.  Cardiomegaly with edema and right pleural effusion.  Original Report Authenticated By: ERIC A. MANSELL, M.D.   Results for Earl, [name] (MRN [id number]) as of 08/15/2011 13:24  Ref. Range 08/13/2011 22:46  C difficile by pcr Latest Range: NEGATIVE  POSITIVE (A)    Medications:    . DISCONTD: sodium chloride 20 mL/hr (08/14/11 0044)  . DISCONTD: dextrose 40 mL/hr (08/16/11 0406)   . ceFEPime (MAXIPIME) IV  1 g Intravenous Q24H  . famotidine (PEPCID) IV  20 mg Intravenous Q12H  . insulin aspart  0-9  Units Subcutaneous Q4H  . metronidazole  500 mg Intravenous Q8H  . sodium chloride  25 mL/kg Intravenous Once  . vancomycin  500 mg Intravenous NOW  . vancomycin  750 mg Intravenous Once  . vancomycin  1,000 mg Intravenous Once    I  have reviewed scheduled and prn medications.  Physical Exam:  Blood pressure 90/49, pulse 64, temperature 98.4 F (36.9 C), temperature source Oral, resp. rate 15, height 5\' 6"  (1.676 m), weight 65.4 kg (144 lb 2.9 oz), SpO2 95.00%. General - no distress appears comfortable HEENT- OG tube in place draining Cardiac - irregular, no murmur afib on tele Chest - basilar rales, decreased bs Abd - distended, increased tympany, no bowel sounds  Ext - no edema whatsoever; ischemic appearing left great toe Neuro - alert, awake, follows commands  Assessment/Plan 1. ESRD - usual MWF [place].3 1/2 hours, EDW unknown. HD today upstairs, no fluid off, then HD MWF. Looks dry. 2. GI- no  evidence SBO by xray, nonspecific BGP, formed stool yesterday. Wants to eat, ordered renal clear liquids, adv as tolerated 3. Lytic lesion right pelvis and abnormal appearance on CT of both kidneys - will need w/u  4. Peripheral Artery Disease - keep eye on left toe 5. Anemia - no idea of baseline Hb and cannot get info from HD unit. 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 AFib/flutter - per primary 8. CDiff positive-on vanco and flagyl  Vinson Moselle  MD [facility] [phone] pgr    [phone] cell 08/16/2011, 12:02 PM

## 2011-08-16 NOTE — Progress Notes (Signed)
ANTIBIOTIC CONSULT NOTE - FOLLOW UP  Pharmacy Consult for Vancomycin, Cefepime Indication: sepsis/?PNA  No Known Allergies  Patient Measurements: Height: 5\' 6"  (167.6 cm) Weight: 144 lb 2.9 oz (65.4 kg) IBW/kg (Calculated) : 63.8   Vital Signs: Temp: 98.4 F (36.9 C) (05/25 0800) Temp src: Oral (05/25 0351) Pulse Rate: 91  (05/25 0700) Intake/Output from previous day: 05/24 0701 - 05/25 0700 In: 970 [I.V.:520; IV Piggyback:450] Out: 1200 [Emesis/NG output:1200] Intake/Output from this shift:    Labs:  Basename 08/16/11 0430 08/15/11 0430 08/14/11 0435  WBC 21.8* 14.2* 8.8  HGB 8.6* 8.3* 9.0*  PLT 247 270 320  LABCREA -- -- --  CREATININE 7.27* 5.96* 8.77*   Estimated Creatinine Clearance: 9.5 ml/min (by C-G formula based on Cr of 7.27).  Microbiology: Recent Results (from the past 720 hour(s))  CULTURE, BLOOD (ROUTINE X 2)     Status: Normal (Preliminary result)   Collection Time   08/13/11  3:15 PM      Component Value Range Status Comment   Specimen Description BLOOD CENTRAL LINE   Final    Special Requests BOTTLES DRAWN AEROBIC AND ANAEROBIC   Final    Culture  Setup Time [id number]   Final    Culture     Final    Value: GRAM POSITIVE RODS     Note: Gram Stain Report Called to,Read Back By and Verified With: GENIVEVE WROBLEWSKI @1150  08/15/11 BY KRAWS   Report Status PENDING   Incomplete   CULTURE, BLOOD (ROUTINE X 2)     Status: Normal (Preliminary result)   Collection Time   08/13/11  3:50 PM      Component Value Range Status Comment   Specimen Description BLOOD HAND RIGHT   Final    Special Requests BOTTLES DRAWN AEROBIC ONLY 5CC   Final    Culture  Setup Time [id number]   Final    Culture     Final    Value:        BLOOD CULTURE RECEIVED NO GROWTH TO DATE CULTURE WILL BE HELD FOR 5 DAYS BEFORE ISSUING A FINAL NEGATIVE REPORT   Report Status PENDING   Incomplete   MRSA PCR SCREENING     Status: Normal   Collection Time   08/13/11  6:50 PM   Component Value Range Status Comment   MRSA by PCR NEGATIVE  NEGATIVE  Final   CLOSTRIDIUM DIFFICILE BY PCR     Status: Abnormal   Collection Time   08/13/11 10:46 PM      Component Value Range Status Comment   C difficile by pcr POSITIVE (*) NEGATIVE  Final   BODY FLUID CULTURE     Status: Normal (Preliminary result)   Collection Time   08/15/11  5:58 PM      Component Value Range Status Comment   Specimen Description PLEURAL FLUID RIGHT   Final    Special Requests NONE   Final    Gram Stain     Final    Value: RARE WBC PRESENT,BOTH PMN AND MONONUCLEAR     NO ORGANISMS SEEN   Culture PENDING   Incomplete    Report Status PENDING   Incomplete     Anti-infectives     Start     Dose/Rate Route Frequency Ordered Stop   08/16/11 1100   metroNIDAZOLE (FLAGYL) tablet 500 mg        500 mg Oral 3 times per day 08/16/11 0943     08/14/11 1200  vancomycin (VANCOCIN) 750 mg in sodium chloride 0.9 % 150 mL IVPB        750 mg 150 mL/hr over 60 Minutes Intravenous  Once 08/14/11 0837 08/14/11 1439   08/14/11 0200   metroNIDAZOLE (FLAGYL) IVPB 500 mg  Status:  Discontinued        500 mg 100 mL/hr over 60 Minutes Intravenous Every 8 hours 08/13/11 1738 08/16/11 0943   08/13/11 1800   ceFEPIme (MAXIPIME) 1 g in dextrose 5 % 50 mL IVPB        1 g 100 mL/hr over 30 Minutes Intravenous Every 24 hours 08/13/11 1738     08/13/11 1745   vancomycin (VANCOCIN) 500 mg in sodium chloride 0.9 % 100 mL IVPB        500 mg 100 mL/hr over 60 Minutes Intravenous NOW 08/13/11 1738 08/14/11 1745   08/13/11 1700   vancomycin (VANCOCIN) IVPB 1000 mg/200 mL premix        1,000 mg 200 mL/hr over 60 Minutes Intravenous  Once 08/13/11 1656     08/13/11 1700   ceFEPIme (MAXIPIME) 2 g in dextrose 5 % 50 mL IVPB  Status:  Discontinued        2 g 100 mL/hr over 30 Minutes Intravenous  Once 08/13/11 1656 08/13/11 1741   08/13/11 1700   metroNIDAZOLE (FLAGYL) IVPB 500 mg        500 mg 100 mL/hr over 60 Minutes  Intravenous  Once 08/13/11 1656 08/13/11 1839   08/13/11 1545   vancomycin (VANCOCIN) IVPB 1000 mg/200 mL premix        1,000 mg 200 mL/hr over 60 Minutes Intravenous  Once 08/13/11 1536 08/13/11 1700   08/13/11 1530  piperacillin-tazobactam (ZOSYN) IVPB 3.375 g       3.375 g 12.5 mL/hr over 240 Minutes Intravenous  Once 08/13/11 1527 08/13/11 1956          Assessment: 61 yo M admitted with acute encephalopathy and hypotension 5/22.  Started on broad spectrum antibiotics for sepsis, possible PNA.  Found to have cdiff.  Procalcitonin slightly decreased (103>>76).  Patient is afebrile, but WBC continues to increase.  He has ESRD on a MWF schedule, although has been a little off of it, with HD on Thursday and to be dialyzed today again, then hope to get back on schedule on Monday.    Cx: 5/22 Cdiff--Pos 5/22 Blood--1/2 gram pos rods 5/22 Sputum--p 5/22 Urine--p 5/24 pleural fluid: NGTD  Abx: 5/22 Vanc >> 5/22 Flagyl (Cdiff, changed to po 5/25) >> 5/22 Cefepime >>  Goal of Therapy:  Pre-HD level 15-62mcg/mL  Plan:  1.  Vancomycin 750 mg IV after HD today 2.  Continue Cefepime 1 gm IV q 24hr 3.  F/up cultures, stool output (Cdiff and can add po vanc if worsens), HD schedule to schedule subsequent vancomycin doses  Rolland Porter, Pharm.D., BCPS Clinical Pharmacist Pager: [phone] 08/16/2011,10:37 AM

## 2011-08-16 NOTE — Procedures (Signed)
I was present at this dialysis session. I have reviewed the session itself and made appropriate changes.   Vinson Moselle, MD [employer] 08/16/2011, 1:55 PM

## 2011-08-16 NOTE — Progress Notes (Signed)
Name: [name] MRN: [id number] DOB: 12/26/1950    LOS: 3  Referring Provider:  EDP Reason for Referral:  Septic shock   PULMONARY / CRITICAL CARE MEDICINE  HPI:  61 yo male former smoker found at home with lethargy, covered in stool/vomit and admitted 08/13/2011 with acute encephalopathy, hypotension.  Last seen 5/20.  Found to have discrepancy between aline pressure readings and cuff pressure readings.  PCCM asked to admit with concern for septic shock. PMHx ESRD on HD, CVA, Diastolic CHF, PVD, HTN  Subjective: Had BM last night.  Pulled out OG tube.  Denies chest/abd pain.  Breathing better after thoracentesis.  Vital Signs: Temp:  [98.3 F (36.8 C)-98.5 F (36.9 C)] 98.4 F (36.9 C) (05/25 0800) Pulse Rate:  [58-138] 91  (05/25 0700) Resp:  [13-24] 13  (05/25 0700) SpO2:  [94 %-100 %] 97 % (05/25 0700) Arterial Line BP: (105-146)/(42-59) 111/47 mmHg (05/25 0600) Weight:  [144 lb 2.9 oz (65.4 kg)] 144 lb 2.9 oz (65.4 kg) (05/25 0433)   Intake/Output Summary (Last 24 hours) at 08/16/11 0929 Last data filed at 08/16/11 0700  Gross per 24 hour  Intake    930 ml  Output   1200 ml  Net   -270 ml    Physical Examination: General - no distress HEENT- no oral exudate Cardiac - irregular, no murmur Chest - scattered rhonchi, no wheeze Abd - soft, + bowel sounds, decreased distention Ext - no edema Neuro - alert, follows commands  Dg Chest Port 1 View  08/16/2011  *RADIOLOGY REPORT*  Clinical Data: Pneumonia.  Pleural effusion.  PORTABLE CHEST - 1 VIEW  Comparison: 08/15/2011  Findings: Nasogastric tube extends into the stomach.  Cardiomegaly noted with mild interstitial accentuation and increased obscuration of the right hemidiaphragm primarily ascribed to pleural effusion and associated passive atelectasis.  There is mild atelectasis at the left lung base along with a small left pleural effusion.  Lung volumes are slightly worsened compared to yesterday's exam.   Atherosclerotic calcification of the aortic arch noted. Glenohumeral degenerative findings noted.  IMPRESSION:  1.  Increase in apparent size of the right pleural effusion with associated passive atelectasis. 2.  Small left pleural effusion with left basilar atelectasis also noted. 3.  Cardiomegaly with minimal interstitial accentuation.  Original Report Authenticated By: Dellia Cloud, M.D.   Dg Chest Port 1 View  08/15/2011  *RADIOLOGY REPORT*  Clinical Data: Status post right thoracentesis.  PORTABLE CHEST - 1 VIEW  Comparison: Earlier the same day  Findings: 1556 hours.  Right pleural effusion has decreased in the interval.  No evidence for pneumothorax.  There is some subsegmental atelectasis at the right base. The NG tube passes into the stomach although the distal tip position is not included on the film. The cardiopericardial silhouette is enlarged.  Diffuse interstitial opacity is improved in the interval, suggesting resolving edema.  IMPRESSION: No evidence for pneumothorax after right thoracentesis.  Original Report Authenticated By: ERIC A. MANSELL, M.D.   Dg Chest Port 1 View  08/15/2011  *RADIOLOGY REPORT*  Clinical Data: Shortness of breath and pleural effusion.  PORTABLE CHEST - 1 VIEW  Comparison: 08/14/2011  Findings: The NG tube passes into the stomach although the distal tip position is not included on the film. The cardiopericardial silhouette is enlarged.  Vascular congestion with diffuse interstitial opacity is consistent with edema.  There is bibasilar atelectasis and right pleural effusion.  IMPRESSION: No substantial change.  Cardiomegaly with edema and right pleural  effusion.  Original Report Authenticated By: ERIC A. MANSELL, M.D.   Dg Abd Portable 1v  08/16/2011  *RADIOLOGY REPORT*  Clinical Data: Small bowel obstruction  PORTABLE ABDOMEN - 1 VIEW  Comparison: 08/15/2011  Findings: Vascular calcifications and rim calcified left renal lesions noted.  Nasogastric tube  extends into the stomach.  The right femoral line projects over the pelvis.  Left pelvic skin clips noted.  Currently most of the bowel is gasless.  The visualized loops of bowel do not demonstrate bowel dilatation.  Lumbar spondylosis is again noted. Pleural effusions noted at the lung bases, with cardiomegaly.  IMPRESSION:  1.  No dilated bowel is currently visible, although much of the bowel is gasless. 2.  Small right and smaller left pleural effusions. 3.  Calcified left renal cysts or masses. 4.  Vascular calcifications. 5.  Right femoral line tip projects over the right sacral ala.  Original Report Authenticated By: Dellia Cloud, M.D.   Dg Abd Portable 1v  08/15/2011  *RADIOLOGY REPORT*  Clinical Data: Small bowel obstruction.  PORTABLE ABDOMEN - 1 VIEW  Comparison: Plain films and CT abdomen and pelvis 08/13/2011.  Findings: New NG tube and right femoral catheter are in place. Surgical staples to the left of midline again noted.  Gas filled small bowel loops are again seen but appear less numerous and prominent.  Bilateral upper quadrant calcifications are noted and consistent with renal calcifications seen on CT.  IMPRESSION: Mild improvement small bowel obstruction.  Original Report Authenticated By: Bernadene Bell. D'ALESSIO, M.D.   CBC    Component Value Date/Time   WBC 21.8* 08/16/2011 0430   RBC 3.12* 08/16/2011 0430   HGB 8.6* 08/16/2011 0430   HCT 27.2* 08/16/2011 0430   PLT 247 08/16/2011 0430   MCV 87.2 08/16/2011 0430   MCH 27.6 08/16/2011 0430   MCHC 31.6 08/16/2011 0430   RDW 17.9* 08/16/2011 0430   LYMPHSABS 1.3 08/13/2011 1427   MONOABS 0.9 08/13/2011 1427   EOSABS 0.0 08/13/2011 1427   BASOSABS 0.1 08/13/2011 1427    BMET    Component Value Date/Time   NA 143 08/16/2011 0430   K 4.1 08/16/2011 0430   CL 102 08/16/2011 0430   CO2 22 08/16/2011 0430   GLUCOSE 141* 08/16/2011 0430   BUN 69* 08/16/2011 0430   CREATININE 7.27* 08/16/2011 0430   CALCIUM 9.2 08/16/2011 0430   GFRNONAA  7* 08/16/2011 0430   GFRAA 8* 08/16/2011 0430    Lab Results  Component Value Date   ALT 10 08/16/2011   AST 19 08/16/2011   ALKPHOS 96 08/16/2011   BILITOT 0.3 08/16/2011   ASSESSMENT AND PLAN  PULMONARY  Thoracentesis 5/24>>LDH 155, Protein 3.2, Glucose < 20, WBC 214 (39N, 5L, 57M, 0E)  Acute hypoxic respiratory failure.  Likely 2nd to PNA, pleural effusion. Plan: -f/u CXR -f/u pleural fluid cytology -titrate oxygen to keep SpO2 > 92%   CARDIOVASCULAR  5/08 Myoview>>fixed basal to mid-inferior perfusion defect, EF 44% 5/10 cardiac cath>>Single vessel obstructive CAD. Moderate nonobstructive plaque elsewhere. Mild LV dysfunction. 5/24 Echo>>EF 50%, inferior/septal hypokinesis  Rt femoral CVL (ER) 5/22>>> Rt femoral Aline 5/22>>>5/25  ?Septic shock>>marked difference between aline and cuff pressure readings.  Procalcitonin 103 from 5/22.  Quickly weaned off pressors after aline placed. Plan: -monitor BP off pressors -d/c aline -place peripheral IV and then d/c CVL  Chronic Systolic/diastolic CHF, PVD. Plan: -cardiology  New onset A fib 5/23. Plan: -appreciate help from cardiology -prn metoprolol -will need to  add coumadin when more stable   RENAL  ESRD on HD.  Gets outpt HD in [place] MWF. Plan: -HD per renal -f/u renal panel -goal negative fluid balance   GASTROINTESTINAL  5/22 CT abd/pelvis>>mod b/l pleural effusions, lytic lesion Rt pelvis and Rt acetabulum, SBO, ?renal mass  SBO.  Resolved 5/25. Plan: -advance diet as tolerated   HEMATOLOGY/ONCOLOGY  Anemia of chronic disease, renal failure, and critical illness. Plan: -f/u CBC -transfuse for Hb < 7  ?lytic lesions Rt pelvis, Rt acetabulum with ?renal mass. Plan: -will need MRI when more stable to further assess  INFECTIOUS  Cultures: BCx2 5/22>>> Sputum 5/22>>> C diff PCR 5/22>>POSITIVE Rt pleural fluid 5/24>>  Antibiotics: Vanc 5/22>>> Cefepime 5/22>>> Flagyl  5/22>>>  Procalcitonin 103>>76  Sepsis.  C dif positive, possible pneumonia. Plan: -D 4/x cefepime, vancomycin>>continue for now pending culture results -D 4/x flagyl for C diff>>change to po 5/25    ENDOCRINE  Lab 08/16/11 0722 08/16/11 0510 08/16/11 0350 08/15/11 2339 08/15/11 2020  GLUCAP 117* 102* 68* 83 91   Hyperglycemia.  No hx of DM, and blood sugars stable. Plan: -d/c SSI   NEUROLOGIC  Acute metabolic encephalopathy.  Resolved 5/24.   BEST PRACTICE / DISPOSITION -Pepcid for SUP -SCD for DVT prophylaxis -Full code -clear liquid diet -OOB  Transfer to tele renal floor.  Transfer to [facility] 5/26 and PCCM sign off.  Coralyn Helling, MD 08/16/2011, 9:29 AM Pager:  [phone]

## 2011-08-17 DIAGNOSIS — D649 Anemia, unspecified: Secondary | ICD-10-CM

## 2011-08-17 DIAGNOSIS — I2510 Atherosclerotic heart disease of native coronary artery without angina pectoris: Secondary | ICD-10-CM

## 2011-08-17 DIAGNOSIS — I70219 Atherosclerosis of native arteries of extremities with intermittent claudication, unspecified extremity: Secondary | ICD-10-CM

## 2011-08-17 DIAGNOSIS — N186 End stage renal disease: Secondary | ICD-10-CM

## 2011-08-17 LAB — RENAL FUNCTION PANEL
CO2: 25 mEq/L (ref 19–32)
Calcium: 9.3 mg/dL (ref 8.4–10.5)
Chloride: 101 mEq/L (ref 96–112)
GFR calc Af Amer: 9 mL/min — ABNORMAL LOW (ref >90)
GFR calc non Af Amer: 8 mL/min — ABNORMAL LOW (ref >90)
Glucose, Bld: 118 mg/dL — ABNORMAL HIGH (ref 70–99)
Potassium: 4 mEq/L (ref 3.5–5.1)
Sodium: 141 mEq/L (ref 135–145)

## 2011-08-17 LAB — CBC
HCT: 27.2 % — ABNORMAL LOW (ref 39.0–52.0)
MCHC: 31.6 g/dL (ref 30.0–36.0)
Platelets: 225 10*3/uL (ref 150–400)
RDW: 18 % — ABNORMAL HIGH (ref 11.5–15.5)
WBC: 32.2 10*3/uL — ABNORMAL HIGH (ref 4.0–10.5)

## 2011-08-17 MED ORDER — ASPIRIN 81 MG PO CHEW
CHEWABLE_TABLET | ORAL | Status: AC
  Filled 2011-08-17: qty 1

## 2011-08-17 MED ORDER — HEPARIN SODIUM (PORCINE) 1000 UNIT/ML DIALYSIS: 2000.0000 [IU] | INTRAMUSCULAR | Status: DC | PRN

## 2011-08-17 MED ORDER — METOPROLOL TARTRATE 25 MG/10 ML ORAL SUSPENSION
12.5000 mg | Freq: Two times a day (BID) | ORAL | Status: DC
  Administered 2011-08-17 – 2011-08-20 (×5): 12.5 mg via ORAL
  Filled 2011-08-17 (×8): qty 5

## 2011-08-17 MED ORDER — ASPIRIN EC 81 MG PO TBEC
81.0000 mg | DELAYED_RELEASE_TABLET | Freq: Every day | ORAL | Status: DC
  Administered 2011-08-17 – 2011-08-23 (×6): 81 mg via ORAL
  Filled 2011-08-17 (×7): qty 1

## 2011-08-17 NOTE — Progress Notes (Signed)
   Subjective: No chest pain or palpitations.   Objective: Blood pressure 119/90, pulse 107, temperature 98.1 F (36.7 C), temperature source Oral, resp. rate 20, height 5\' 6"  (1.676 m), weight 144 lb 10 oz (65.6 kg), SpO2 100.00%.  Intake/Output Summary (Last 24 hours) at 08/17/11 1013 Last data filed at 08/17/11 0900  Gross per 24 hour  Intake    210 ml  Output    -32 ml  Net    242 ml   Telemetry - Sinus rhythm, converted last evening.  Exam -   General - Chronically ill appearing, NAD.  Lungs - Decreased at bases with rhonchi.  Cardiac - RRR, no gallop.  Extremities - No pitting.  Lab Results -  Basic Metabolic Panel:  Lab 08/17/11 1478 08/16/11 1358 08/16/11 0430 08/14/11 0435  NA 141 141 143 --  K 4.0 4.1 4.1 --  CL 101 100 102 --  CO2 25 23 22  --  GLUCOSE 118* 133* 141* --  BUN 54* 71* 69* --  CREATININE 6.77* 7.77* 7.27* --  CALCIUM 9.3 9.2 9.2 --  MG -- -- 2.0 1.8    CBC:  Lab 08/17/11 0844 08/16/11 1358 08/16/11 0430  WBC 32.2* 25.6* 21.8*  HGB 8.6* 9.3* 8.6*  HCT 27.2* 28.8* 27.2*  MCV 87.5 87.8 87.2  PLT 225 197 247    Cardiac Enzymes:  Lab 08/14/11 1505 08/14/11 0435 08/13/11 2315  CKTOTAL 254* 410* 614*  CKMB 4.4* 5.7* 5.7*  CKMBINDEX -- -- --  TROPONINI <0.30 <0.30 <0.30    Current Medications    . ceFEPime (MAXIPIME) IV  1 g Intravenous Q24H  . darbepoetin (ARANESP) injection - DIALYSIS  60 mcg Intravenous Q Mon-HD  . famotidine  20 mg Oral QHS  . ferric gluconate (FERRLECIT/NULECIT) IV  125 mg Intravenous Q M,W,F-HD  . ferric gluconate (FERRLECIT/NULECIT) IV  125 mg Intravenous Once  . metroNIDAZOLE  500 mg Oral Q8H  . sodium chloride  25 mL/kg Intravenous Once  . vancomycin  750 mg Intravenous Once  . vancomycin  1,000 mg Intravenous Once    Assessment:  1. Atrial fibrillation, converted spontaneously last evening to sinus rhythm. Not on any standing rate control medications or anticoagulation now.  2. CAD, managed  medically, LVEF 50% with ISHK.  2. ESRD on HD.  3. PAD.  4. Acute on chronic anemia.  5. C. Diff positive, on antibiotics.  6. Recent sepsis, possible PNA.  Plan:  Would resume ASA and try low dose beta blocker if BP tolerates. Followup ECG. His candidacy for oral anticoagulant should be discussed with his primary cardiologist prior to initiation. Bleeding risk might exceed potential benefit.  Jonelle Sidle, M.D., F.A.C.C.

## 2011-08-17 NOTE — Progress Notes (Signed)
Subjective: No complaints.    Vital signs in last 24 hours: Filed Vitals:   08/16/11 2215 08/17/11 0602 08/17/11 0934 08/17/11 1400  BP: 89/39 97/56 119/90 98/48  Pulse: 102 100 107 82  Temp: 99.1 F (37.3 C) 98.7 F (37.1 C) 98.1 F (36.7 C) 98.4 F (36.9 C)  TempSrc: Oral Oral Oral Oral  Resp: 22 24 20 21   Height:      Weight: 65.6 kg (144 lb 10 oz)     SpO2:  100% 100% 97%   Weight change: -2.5 kg (-5 lb 8.2 oz)  Intake/Output Summary (Last 24 hours) at 08/17/11 1521 Last data filed at 08/17/11 1300  Gross per 24 hour  Intake    450 ml  Output    -32 ml  Net    482 ml   Labs: Basic Metabolic Panel:  Lab 08/17/11 1191 08/16/11 1358 08/16/11 0430 08/15/11 0430 08/14/11 0435 08/13/11 1611 08/13/11 1530  NA 141 141 143 142 141 141 144  K 4.0 4.1 4.1 4.2 4.8 4.4 4.7  CL 101 100 102 100 96 105 95*  CO2 25 23 22 25 25  -- 22  GLUCOSE 118* 133* 141* 97 92 113* 112*  BUN 54* 71* 69* 56* 89* 76* 81*  CREATININE 6.77* 7.77* 7.27* 5.96* 8.77* 9.60* 8.91*  ALB -- -- -- -- -- -- --  CALCIUM 9.3 9.2 9.2 9.3 9.2 -- 9.3  PHOS 4.4 5.4* 5.8* 6.1* 7.8* -- --   Liver Function Tests:  Lab 08/17/11 0844 08/16/11 1358 08/16/11 0430 08/13/11 1530  AST -- -- 19 39*  ALT -- -- 10 12  ALKPHOS -- -- 96 64  BILITOT -- -- 0.3 0.5  PROT -- -- 5.0* 5.9*  ALBUMIN 2.2* 2.0* 1.9* --    Lab 08/13/11 1530  LIPASE 5*  AMYLASE --   No results found for this basename: AMMONIA:3 in the last 168 hours CBC:  Lab 08/17/11 0844 08/16/11 1358 08/16/11 0430 08/15/11 0430 08/13/11 1427  WBC 32.2* 25.6* 21.8* 14.2* --  NEUTROABS -- -- -- -- 10.4*  HGB 8.6* 9.3* 8.6* 8.3* --  HCT 27.2* 28.8* 27.2* 25.8* --  MCV 87.5 87.8 87.2 86.0 --  PLT 225 197 247 270 --   PT/INR: @labrcntip (inr:5) Cardiac Enzymes:  Lab 08/14/11 1505 08/14/11 0435 08/13/11 2315  CKTOTAL 254* 410* 614*  CKMB 4.4* 5.7* 5.7*  CKMBINDEX -- -- --  TROPONINI <0.30 <0.30 <0.30   CBG:  Lab 08/16/11 1742 08/16/11 1151  08/16/11 0722 08/16/11 0510 08/16/11 0350  GLUCAP 77 100* 117* 102* 68*    Iron Studies:   Lab 08/14/11 1221  IRON 15*  TIBC 78*  TRANSFERRIN --  FERRITIN 3239*    08/15/2011  *RADIOLOGY REPORT*  Clinical Data: Shortness of breath and pleural effusion.  PORTABLE CHEST - 1 VIEW  Comparison: 08/14/2011  Findings: The NG tube passes into the stomach although the distal tip position is not included on the film. The cardiopericardial silhouette is enlarged.  Vascular congestion with diffuse interstitial opacity is consistent with edema.  There is bibasilar atelectasis and right pleural effusion.  IMPRESSION: No substantial change.  Cardiomegaly with edema and right pleural effusion.  Original Report Authenticated By: ERIC A. MANSELL, M.D.   Results for [name], [name] (MRN [id number]) as of 08/15/2011 13:24  Ref. Range 08/13/2011 22:46  C difficile by pcr Latest Range: NEGATIVE  POSITIVE (A)    Medications:   . ceFEPime (MAXIPIME) IV  1 g Intravenous Q24H  .  famotidine (PEPCID) IV  20 mg Intravenous Q12H  . insulin aspart  0-9 Units Subcutaneous Q4H  . metronidazole  500 mg Intravenous Q8H  . sodium chloride  25 mL/kg Intravenous Once  . vancomycin  500 mg Intravenous NOW  . vancomycin  750 mg Intravenous Once  . vancomycin  1,000 mg Intravenous Once    I  have reviewed scheduled and prn medications.  Physical Exam:  Blood pressure 98/48, pulse 82, temperature 98.4 F (36.9 C), temperature source Oral, resp. rate 21, height 5\' 6"  (1.676 m), weight 65.6 kg (144 lb 10 oz), SpO2 97.00%. General - no distress appears comfortable HEENT- OG tube in place draining Cardiac - irregular, no murmur afib on tele Chest - basilar rales, decreased bs Abd - distended, increased tympany, no bowel sounds  Ext - no edema whatsoever; ischemic appearing left great toe Neuro - alert, awake, follows commands  Assessment/Plan 1. ESRD / usual MWF [place]- L upper arm AVF is clotted. Pulling clots from it  yest mid-HD, and today there is no thrill or bruit.  Discussed with IR, they will attempt declot Monday am. HD written for Monday after procedure.  2. GI- no evidence SBO by xray, nonspecific BGP, formed stool yesterday. Wants to eat, ordered renal clear liquids, adv as tolerated 3. Leukocytosis- WBC up to 32K today.  Does not look ill, maybe response to Cdif.  4. Lytic lesion right pelvis and abnormal appearance on CT of both kidneys - will need w/u  5. Peripheral Artery Disease - keep eye on left toe 6. Anemia - no idea of baseline Hb and cannot get info from HD unit. 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 AFib/flutter - per primary 9. CDiff positive-on vanco and flagyl  Vinson Moselle  MD [facility] [phone] pgr    [phone] cell 08/17/2011, 3:21 PM

## 2011-08-17 NOTE — Progress Notes (Signed)
Patient ID: [name], male   DOB: 13-Nov-1950, 61 y.o.   MRN: [id number] Consulted by the Renal service to evaluate left arm fistula.  Reportedly,  pulling clots from fistula during last HD session.  Patient is being treated for C. Diff and possible pneumonia.  The fistula was declotted last year.  Exam:  Left arm fistula is patent with strong pulse.  Difficult to feel the upper portion of the cephalic vein.  Impression:  Left cephalic vein fistula is patent.  Will perform fistulogram on 5/27 if patient remains afebrile.  Will get consent from family.

## 2011-08-17 NOTE — Progress Notes (Signed)
Subjective: Patient transferred to IM from CCM. He had presented with encephalopathy, SIRS due to possible pneumonia in setting of ESRD, DM, CAD. He was resuscitated, treated with broad spectrum antibiotics and did improve: hemodynamically stable, return to baseline mental status. He has developed C. Diff colitis and did have a. Fib which has resolved. He is followed by nephrology and cardiology.  Patient has poor insight into his medical condition. He has no complaints at this time except for weakness and inability to walk, a problem for the past 4 months.  Objective: Lab: Lab Results  Component Value Date   WBC 32.2* 08/17/2011   HGB 8.6* 08/17/2011   HCT 27.2* 08/17/2011   MCV 87.5 08/17/2011   PLT 225 08/17/2011   BMET    Component Value Date/Time   NA 141 08/17/2011 0844   K 4.0 08/17/2011 0844   CL 101 08/17/2011 0844   CO2 25 08/17/2011 0844   GLUCOSE 118* 08/17/2011 0844   BUN 54* 08/17/2011 0844   CREATININE 6.77* 08/17/2011 0844   CALCIUM 9.3 08/17/2011 0844   GFRNONAA 8* 08/17/2011 0844   GFRAA 9* 08/17/2011 0844     Imaging: CXR 08/16/11 IMPRESSION:  1. Increase in apparent size of the right pleural effusion with  associated passive atelectasis.  2. Small left pleural effusion with left basilar atelectasis also  noted.  3. Cardiomegaly with minimal interstitial accentuation.   Physical Exam: Filed Vitals:   08/17/11 0934  BP: 119/90  Pulse: 107  Temp: 98.1 F (36.7 C)  Resp: 20    Intake/Output Summary (Last 24 hours) at 08/17/11 1132 Last data filed at 08/17/11 0900  Gross per 24 hour  Intake    210 ml  Output    -32 ml  Net    242 ml   Gen'l- chronically ill appearing AA man in no distress HEENT- C&S very muddy, arcus senilis noted, gross appearance of cataracts Neck- supple Cor - 2+ radial, good thrill AVaccess proximal right UE, RRR Pulm - normal respirations, w/o increased WOB, no rales Abd - protuberant, BS+, no guarding or rebound Ext- no  deformity Neuro - awake, alert. Poor insight to his illnesses.     Assessment/Plan: 1. Pulmonary - stable w/o respiratory difficulty. Good O2 sats. Persistent effusions  2. Cardiac - hemodynamically stable. He is in sinus rhythm. EF 50% No interventions planned.  3. Renal - ESRD on HD - will continue routine HD  4. GI - no evidence of SBO. C. Diff - on Vanc/Flagyl - see ID below.  5. Heme - anemia of chronic disease. Current Hgb stable and above transfusion threshold.  6. ID - presumed PNA and c. Diff. Day #5 Vanc/cefepime/flagyl Plan Continue present antibiotic regimen  7. Hyperglycemia -  CBG (last 3)   Basename 08/16/11 1742 08/16/11 1151 08/16/11 0722  GLUCAP 77 100* 117*    Stable CBGs - no chronic problem.  8. Neuro - metabolic encephalopathy - resolved.  9. Dispo - multiple medical problems, inability to walk, unsafe to live alone. Plan PT/OT eval  SNF   Illene Regulus 08/17/2011, 11:24 AM

## 2011-08-18 ENCOUNTER — Inpatient Hospital Stay (HOSPITAL_COMMUNITY): Payer: Medicare Other

## 2011-08-18 LAB — PATHOLOGIST SMEAR REVIEW: Tech Review: REACTIVE

## 2011-08-18 MED ORDER — IOHEXOL 300 MG/ML  SOLN
100.0000 mL | Freq: Once | INTRAMUSCULAR | Status: AC | PRN
  Administered 2011-08-18: 100 mL via INTRAVENOUS

## 2011-08-18 MED ORDER — HEPARIN SODIUM (PORCINE) 1000 UNIT/ML IJ SOLN
INTRAMUSCULAR | Status: AC | PRN
  Administered 2011-08-18: 3000 [IU] via INTRAVENOUS

## 2011-08-18 MED ORDER — ALTEPLASE 100 MG IV SOLR
2.0000 mg | Freq: Once | INTRAVENOUS | Status: AC
  Administered 2011-08-18: 2 mg
  Filled 2011-08-18: qty 2

## 2011-08-18 MED ORDER — VANCOMYCIN HCL 1000 MG IV SOLR
750.0000 mg | INTRAVENOUS | Status: DC
  Administered 2011-08-18: 750 mg via INTRAVENOUS
  Filled 2011-08-18 (×2): qty 750

## 2011-08-18 NOTE — Procedures (Signed)
Post-Procedure Note  Pre-operative Diagnosis: Clotted left arm dialysis fistula       Post-operative Diagnosis: Same   Indications: Needs HD  Procedure Details:   Left cephalic vein fistula declot performed with TPA, AngioJet, angioplasty and thrombectomy device.  Findings: Cephalic vein just above elbow was patent but occluded in upper arm.  Improved flow in vein at end of procedure with persistent thrombus in vein.      Complications: None     Condition: Stable  Plan: Try to use fistula tomorrow.  If the vein occludes again, will need a catheter and surgical consultation for new access.

## 2011-08-18 NOTE — Progress Notes (Signed)
  Maintaining SR on tele with PVCs. Likely not good coumadin candidate. Continue ASA.   We will sign-off. Call with questions.   Caprisha Bridgett,MD 10:20 AM

## 2011-08-18 NOTE — Evaluation (Signed)
Physical Therapy Evaluation Patient Details Name: [name] MRN: [id number] DOB: [birth year] Today's Date: 08/18/2011 Time: 4098-1191 PT Time Calculation (min): 28 min  PT Assessment / Plan / Recommendation Clinical Impression  Pt admitted to ICU with SIRS due to ? pneumonia. Now with encephalopathy with AMS impacting his safety with mobility. Will benefit from PT to further assess gait/balance as pt must be able to travel to HD 3x/week as an outpatient.    PT Assessment  Patient needs continued PT services    Follow Up Recommendations  Home health PT;Supervision/Assistance - 24 hour (due to decr cognition/safety)    Barriers to Discharge Decreased caregiver support (unknown)      lEquipment Recommendations  None recommended by PT    Recommendations for Other Services     Frequency Min 3X/week    Precautions / Restrictions Precautions Precautions: Fall Precaution Comments: ?orthostatic Restrictions Weight Bearing Restrictions: No   Pertinent Vitals/Pain ?orthostatic (lightheaded while standing/walking)      Mobility  Bed Mobility Bed Mobility: Rolling Right;Right Sidelying to Sit;Sitting - Scoot to Edge of Bed Rolling Right: 6: Modified independent (Device/Increase time);With rail Right Sidelying to Sit: 4: Min guard;With rails;HOB elevated (incr effort) Sitting - Scoot to Edge of Bed: 5: Supervision Transfers Transfers: Sit to Stand;Stand to Sit Sit to Stand: 4: Min guard;From bed (x2 (returned to sitting due to lightheadedness)) Stand to Sit: 4: Min guard;To chair/3-in-1;To bed (x 2) Details for Transfer Assistance: Pt required assist due to lightheadedness; improved with seated ankle pumps Ambulation/Gait Ambulation/Gait Assistance: 4: Min assist Ambulation Distance (Feet): 20 Feet Assistive device: 1 person hand held assist Ambulation/Gait Assistance Details: Pt looks down at his feet, will only briefly look forward (? lightheaded, pt would not state why);  no overt loss of balance, assist for safety Gait Pattern: Step-through pattern;Decreased stride length;Wide base of support    Exercises     PT Diagnosis: Difficulty walking;Altered mental status  PT Problem List: Decreased activity tolerance;Decreased balance;Decreased mobility;Decreased cognition;Decreased knowledge of use of DME;Decreased safety awareness;Cardiopulmonary status limiting activity PT Treatment Interventions: DME instruction;Gait training;Functional mobility training;Therapeutic activities;Balance training;Cognitive remediation;Patient/family education   PT Goals Acute Rehab PT Goals PT Goal Formulation: With patient Time For Goal Achievement: 08/25/11 Potential to Achieve Goals: Good Pt will go Supine/Side to Sit: with modified independence PT Goal: Supine/Side to Sit - Progress: Goal set today Pt will go Sit to Supine/Side: with modified independence PT Goal: Sit to Supine/Side - Progress: Goal set today Pt will go Sit to Stand: with modified independence PT Goal: Sit to Stand - Progress: Goal set today Pt will Ambulate: 51 - 150 feet;with supervision;with least restrictive assistive device PT Goal: Ambulate - Progress: Goal set today Additional Goals Additional Goal #1: Pt will participate in Berg vs DGI balance assessment to further assess fall risk and need for DME PT Goal: Additional Goal #1 - Progress: Goal set today  Visit Information  Last PT Received On: 08/18/11 Assistance Needed: +1 PT/OT Co-Evaluation/Treatment: Yes    Subjective Data  Subjective: Pt reports he does not have problems with his balance PTA Patient Stated Goal: To return home with wife   Prior Functioning  Home Living Lives With: Spouse Available Help at Discharge: Family (unclear hours available (wife not present, pt confused)) Type of Home: Apartment Home Access: Level entry Home Layout: One level Bathroom Shower/Tub: Tub/shower unit Home Adaptive Equipment: Hand-held shower  hose;Shower chair with back;Straight cane;Walker - rolling Prior Function Level of Independence: Independent (per pt, ?reliable) Driving: Yes  Comments: states he drove himself to HD Communication Communication: No difficulties    Cognition  Overall Cognitive Status: Impaired Area of Impairment: Memory;Awareness of errors Arousal/Alertness: Awake/alert Orientation Level: Time;Situation Behavior During Session: Flat affect Memory Deficits: pt changed his answer re: driving (first no and then stated he drives to grocery store and takes Zenaida Niece to HD) Awareness of Errors: Assistance required to identify errors made;Assistance required to correct errors made Awareness of Errors - Other Comments: pt trying to use the call bell to raise the volume, repeatedly pushing call bell despite education    Extremity/Trunk Assessment Right Lower Extremity Assessment RLE ROM/Strength/Tone: Within functional levels Left Lower Extremity Assessment LLE ROM/Strength/Tone: Within functional levels   Balance Balance Balance Assessed: Yes Dynamic Sitting Balance Dynamic Sitting - Balance Support: No upper extremity supported;Feet supported Dynamic Sitting - Level of Assistance: 5: Stand by assistance (near loss of balance leaning down to don Rt sock) Static Standing Balance Static Standing - Balance Support: No upper extremity supported Static Standing - Level of Assistance: 4: Min assist Rhomberg - Eyes Opened:  (attempted, unable to obtain position (immediately stepped))  End of Session PT - End of Session Equipment Utilized During Treatment: Gait belt Activity Tolerance: Treatment limited secondary to medical complications (Comment) (?orthostatic) Patient left: in chair;with call bell/phone within reach;with family/visitor present (in camera room) Nurse Communication: Mobility status (? need for chair alarm)   [name] 08/18/2011, 11:30 AM  Pager [phone]

## 2011-08-18 NOTE — Progress Notes (Signed)
[facility] ROUNDING NOTE   Subjective:   Interval History: none.  Objective:  Vital signs in last 24 hours:  Temp:  [98 F (36.7 C)-98.7 F (37.1 C)] 98 F (36.7 C) (05/27 0458) Pulse Rate:  [79-107] 79  (05/27 0458) Resp:  [20-21] 20  (05/27 0458) BP: (98-137)/(48-90) 120/59 mmHg (05/27 0458) SpO2:  [96 %-100 %] 96 % (05/27 0458) Weight:  [65.5 kg (144 lb 6.4 oz)] 65.5 kg (144 lb 6.4 oz) (05/26 2024)  Weight change: 2.6 kg (5 lb 11.7 oz) Filed Weights   08/16/11 1645 08/16/11 2215 08/17/11 2024  Weight: 62.9 kg (138 lb 10.7 oz) 65.6 kg (144 lb 10 oz) 65.5 kg (144 lb 6.4 oz)    Intake/Output: I/O last 3 completed shifts: In: 570 [P.O.:520; IV Piggyback:50] Out: -    Intake/Output this shift:     General - no distress appears comfortable  HEENT- OG tube in place draining  Cardiac - irregular, no murmur afib on tele  Chest - basilar rales, decreased bs  Abd - distended, increased tympany, no bowel sounds  Ext - no edema whatsoever; ischemic appearing left great toe  Neuro - alert, awake, follows commands    Basic Metabolic Panel:  Lab 08/17/11 1610 08/16/11 1358 08/16/11 0430 08/15/11 0430 08/14/11 0435  NA 141 141 143 142 141  K 4.0 4.1 4.1 4.2 4.8  CL 101 100 102 100 96  CO2 25 23 22 25 25   GLUCOSE 118* 133* 141* 97 92  BUN 54* 71* 69* 56* 89*  CREATININE 6.77* 7.77* 7.27* 5.96* 8.77*  CALCIUM 9.3 9.2 9.2 -- --  MG -- -- 2.0 -- 1.8  PHOS 4.4 5.4* 5.8* 6.1* 7.8*    Liver Function Tests:  Lab 08/17/11 0844 08/16/11 1358 08/16/11 0430 08/15/11 0430 08/13/11 1530  AST -- -- 19 -- 39*  ALT -- -- 10 -- 12  ALKPHOS -- -- 96 -- 64  BILITOT -- -- 0.3 -- 0.5  PROT -- -- 5.0* -- 5.9*  ALBUMIN 2.2* 2.0* 1.9* 2.0* 2.2*    Lab 08/13/11 1530  LIPASE 5*  AMYLASE --   No results found for this basename: AMMONIA:3 in the last 168 hours  CBC:  Lab 08/17/11 0844 08/16/11 1358 08/16/11 0430 08/15/11 0430 08/14/11 0435 08/13/11 1427  WBC 32.2*  25.6* 21.8* 14.2* 8.8 --  NEUTROABS -- -- -- -- -- 10.4*  HGB 8.6* 9.3* 8.6* 8.3* 9.0* --  HCT 27.2* 28.8* 27.2* 25.8* 27.4* --  MCV 87.5 87.8 87.2 86.0 84.8 --  PLT 225 197 247 270 320 --    Cardiac Enzymes:  Lab 08/14/11 1505 08/14/11 0435 08/13/11 2315  CKTOTAL 254* 410* 614*  CKMB 4.4* 5.7* 5.7*  CKMBINDEX -- -- --  TROPONINI <0.30 <0.30 <0.30    BNP: No components found with this basename: POCBNP:5  CBG:  Lab 08/16/11 1742 08/16/11 1151 08/16/11 0722 08/16/11 0510 08/16/11 0350  GLUCAP 77 100* 117* 102* 68*    Microbiology: Results for orders placed during the hospital encounter of 08/13/11  CULTURE, BLOOD (ROUTINE X 2)     Status: Normal   Collection Time   08/13/11  3:15 PM      Component Value Range Status Comment   Specimen Description BLOOD CENTRAL LINE   Final    Special Requests BOTTLES DRAWN AEROBIC AND ANAEROBIC   Final    Culture  Setup Time [id number]   Final    Culture     Final  Value: DIPHTHEROIDS(CORYNEBACTERIUM SPECIES)     Note: Standardized susceptibility testing for this organism is not available.     Note: Gram Stain Report Called to,Read Back By and Verified With: GENIVEVE WROBLEWSKI @1150  08/15/11 BY KRAWS   Report Status 08/16/2011 FINAL   Final   CULTURE, BLOOD (ROUTINE X 2)     Status: Normal (Preliminary result)   Collection Time   08/13/11  3:50 PM      Component Value Range Status Comment   Specimen Description BLOOD HAND RIGHT   Final    Special Requests BOTTLES DRAWN AEROBIC ONLY 5CC   Final    Culture  Setup Time [id number]   Final    Culture     Final    Value:        BLOOD CULTURE RECEIVED NO GROWTH TO DATE CULTURE WILL BE HELD FOR 5 DAYS BEFORE ISSUING A FINAL NEGATIVE REPORT   Report Status PENDING   Incomplete   MRSA PCR SCREENING     Status: Normal   Collection Time   08/13/11  6:50 PM      Component Value Range Status Comment   MRSA by PCR NEGATIVE  NEGATIVE  Final   CLOSTRIDIUM DIFFICILE BY PCR     Status:  Abnormal   Collection Time   08/13/11 10:46 PM      Component Value Range Status Comment   C difficile by pcr POSITIVE (*) NEGATIVE  Final   BODY FLUID CULTURE     Status: Normal (Preliminary result)   Collection Time   08/15/11  5:58 PM      Component Value Range Status Comment   Specimen Description PLEURAL FLUID RIGHT   Final    Special Requests NONE   Final    Gram Stain     Final    Value: RARE WBC PRESENT,BOTH PMN AND MONONUCLEAR     NO ORGANISMS SEEN   Culture NO GROWTH 1 DAY   Final    Report Status PENDING   Incomplete     Coagulation Studies: No results found for this basename: LABPROT:5,INR:5 in the last 72 hours  Urinalysis: No results found for this basename: COLORURINE:2,APPERANCEUR:2,LABSPEC:2,PHURINE:2,GLUCOSEU:2,HGBUR:2,BILIRUBINUR:2,KETONESUR:2,PROTEINUR:2,UROBILINOGEN:2,NITRITE:2,LEUKOCYTESUR:2 in the last 72 hours    Imaging: No results found.   Medications:        . aspirin      . aspirin EC  81 mg Oral Daily  . ceFEPime (MAXIPIME) IV  1 g Intravenous Q24H  . darbepoetin (ARANESP) injection - DIALYSIS  60 mcg Intravenous Q Mon-HD  . famotidine  20 mg Oral QHS  . ferric gluconate (FERRLECIT/NULECIT) IV  125 mg Intravenous Q M,W,F-HD  . metoprolol tartrate  12.5 mg Oral BID  . metroNIDAZOLE  500 mg Oral Q8H  . sodium chloride  25 mL/kg Intravenous Once  . vancomycin  1,000 mg Intravenous Once   heparin, metoprolol  Assessment/ Plan:  1. ESRD / usual MWF [place]- L upper arm AVF is clotted. Pulling clots from it yest mid-HD, and today there is no thrill or bruit. Discussed with IR, they will attempt declot Monday am. HD written for Monday after procedure.  2. GI- no evidence SBO by xray, nonspecific BGP, formed stool yesterday. Wants to eat, ordered renal clear liquids, adv as tolerated 3. Leukocytosis- WBC up to 32K today. Does not look ill, maybe response to Cdif.  4. Lytic lesion right pelvis and abnormal appearance on CT of both kidneys - will  need w/u  5. Peripheral Artery Disease - keep  eye on left toe 6. Anemia - no idea of baseline Hb and cannot get info from HD unit. 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 AFib/flutter - per primary 9. 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

## 2011-08-18 NOTE — Progress Notes (Signed)
Hemodialysis-Unable to cannulate. Pt will need perm cath insertion tomorrow with HD following per Dr. Hyman Hopes. Order written for consult to IR.

## 2011-08-18 NOTE — Evaluation (Signed)
Occupational Therapy Evaluation Patient Details Name: [name] MRN: [id number] DOB: [birth year] Today's Date: 08/18/2011 Time: 1003-1030 OT Time Calculation (min): 27 min  OT Assessment / Plan / Recommendation Clinical Impression  Pt. presented to ICU with SIRS due to ? pneumonia. Now with encephalopathy and AMS impacting his safety and function with ADLs. Pt. will benefit from skilled OT to increase functional independence to supervision level at D/C    OT Assessment  Patient needs continued OT Services    Follow Up Recommendations  Home health OT;Supervision/Assistance - 24 hour    Barriers to Discharge None;Other (comment) (Questionable help at home.)    Equipment Recommendations  None recommended by OT       Frequency  Min 2X/week    Precautions / Restrictions Precautions Precautions: Fall Precaution Comments: ?orthostatic (Pt. initially reports dizziness with standing) Restrictions Weight Bearing Restrictions: No       ADL  Eating/Feeding: Simulated;Set up Where Assessed - Eating/Feeding: Chair Grooming: Performed;Wash/dry face;Set up Where Assessed - Grooming: Supported sitting Upper Body Bathing: Simulated;Set up Where Assessed - Upper Body Bathing: Supported sitting Lower Body Bathing: Simulated;Minimal assistance Where Assessed - Lower Body Bathing: Unsupported sit to stand Upper Body Dressing: Performed;Minimal assistance (don gown) Where Assessed - Upper Body Dressing: Supported sitting Lower Body Dressing: Performed;Minimal assistance (Don socks assist with initiating Rt. sock due to reach limit) Where Assessed - Lower Body Dressing: Unsupported sit to stand Toilet Transfer: Simulated;Minimal assistance Toilet Transfer Method: Sit to stand;Stand pivot Toilet Transfer Equipment: Other (comment) Nurse, children's) Toileting - Clothing Manipulation and Hygiene: Simulated;Minimal assistance Where Assessed - Toileting Clothing Manipulation and Hygiene: Sit to  stand from 3-in-1 or toilet Tub/Shower Transfer Method: Not assessed Transfers/Ambulation Related to ADLs: Pt. min assist bil hand held ~15'  ADL Comments: Pt. completed bil LE don/doff socks with increased time and by crossing both feet over opposite knee to perform. Pt. with difficulty moving rt. foot over knee and needed assist reaching down to foot to initiate the donning of the sock.    OT Diagnosis: Generalized weakness;Cognitive deficits  OT Problem List: Decreased strength;Decreased activity tolerance;Impaired balance (sitting and/or standing);Decreased cognition;Decreased safety awareness;Decreased knowledge of use of DME or AE;Cardiopulmonary status limiting activity OT Treatment Interventions: Self-care/ADL training;Energy conservation;DME and/or AE instruction;Therapeutic activities;Patient/family education;Balance training;Cognitive remediation/compensation   OT Goals Acute Rehab OT Goals OT Goal Formulation: With patient Time For Goal Achievement: 08/25/11 Potential to Achieve Goals: Good ADL Goals Pt Will Perform Grooming: with set-up;with supervision;Standing at sink ADL Goal: Grooming - Progress: Goal set today Pt Will Perform Upper Body Dressing: with set-up;with supervision;Standing ADL Goal: Upper Body Dressing - Progress: Goal set today Pt Will Perform Lower Body Dressing: with set-up;with supervision;Sit to stand from bed ADL Goal: Lower Body Dressing - Progress: Goal set today Pt Will Transfer to Toilet: with supervision;with DME;Ambulation;3-in-1 ADL Goal: Toilet Transfer - Progress: Goal set today Additional ADL Goal #1: Pt. will utilize 2 energy conservation techniques with an ADL task to increase function ADL Goal: Additional Goal #1 - Progress: Goal set today  Visit Information  Last OT Received On: 08/18/11 Assistance Needed: +1 PT/OT Co-Evaluation/Treatment: Yes    Subjective Data  Subjective: "This is my sister" Patient Stated Goal: "I can get up"     Prior Functioning  Home Living Lives With: Spouse Available Help at Discharge: Family Type of Home: Apartment Home Access: Level entry Home Layout: One level Bathroom Shower/Tub: Engineer, manufacturing systems: Standard Home Adaptive Equipment: Hand-held shower hose;Shower chair with back;Straight cane;Walker -  rolling Prior Function Level of Independence: Independent (? how reliable pt. is) Able to Take Stairs?: Yes Driving: Yes Comments: states he drove himself to HD Communication Communication: No difficulties    Cognition  Overall Cognitive Status: Impaired Area of Impairment: Memory;Awareness of errors Arousal/Alertness: Awake/alert Orientation Level: Time;Situation Behavior During Session: Flat affect Memory Deficits: pt changed his answer re: driving (first no and then stated he drives to grocery store and takes Zenaida Niece to HD) Awareness of Errors: Assistance required to identify errors made;Assistance required to correct errors made Awareness of Errors - Other Comments: pt trying to use the call bell to raise the volume, repeatedly pushing call bell despite education Cognition - Other Comments: Pt. unable to recall red nurse call bell and stating the blue button was how to call the nurse. and education provided to pt. on safety.    Extremity/Trunk Assessment Right Upper Extremity Assessment RUE ROM/Strength/Tone: Within functional levels RUE Sensation: WFL - Light Touch RUE Coordination: WFL - gross/fine motor Left Upper Extremity Assessment LUE ROM/Strength/Tone: WFL for tasks assessed LUE Sensation: WFL - Light Touch LUE Coordination: WFL - gross/fine motor Right Lower Extremity Assessment RLE ROM/Strength/Tone: Within functional levels Left Lower Extremity Assessment LLE ROM/Strength/Tone: Within functional levels   Mobility Bed Mobility Bed Mobility: Rolling Right;Right Sidelying to Sit;Sitting - Scoot to Edge of Bed Rolling Right: 6: Modified independent  (Device/Increase time);With rail Right Sidelying to Sit: 4: Min guard;With rails;HOB elevated Sitting - Scoot to Edge of Bed: 5: Supervision Details for Bed Mobility Assistance: Min verbal cues for hand placement and technique to increase pt. mobility Transfers Transfers: Sit to Stand;Stand to Sit Sit to Stand: 4: Min guard;From bed Stand to Sit: 4: Min guard;To chair/3-in-1;To bed Details for Transfer Assistance: Pt required assist due to lightheadedness; improved with seated ankle pumps      Balance Balance Balance Assessed: Yes Dynamic Sitting Balance Dynamic Sitting - Balance Support: No upper extremity supported;Feet supported Dynamic Sitting - Level of Assistance: 5: Stand by assistance (near loss of balance leaning down to don Rt sock) Static Standing Balance Static Standing - Balance Support: No upper extremity supported Static Standing - Level of Assistance: 4: Min assist Rhomberg - Eyes Opened:  (attempted, unable to obtain position (immediately stepped))  End of Session OT - End of Session Equipment Utilized During Treatment: Gait belt Activity Tolerance: Patient tolerated treatment well Patient left: in chair;with call bell/phone within reach;with family/visitor present Nurse Communication: Mobility status   Kalel Harty, OTR/L Pager [phone] 08/18/2011, 11:58 AM

## 2011-08-18 NOTE — Progress Notes (Signed)
Patient ID: [name], male   DOB: [birth year], 61 y.o.   MRN: [id number] Evaluated left arm fistula with ultrasound.  The lower cephalic vein is patent but the mid and upper arm are occluded.  Patient is very confused.  Declot consent obtained from family yesterday. PE:  Lungs:  Clear ;  Heart: RRR; left arm:  Palpable left radial pulse. Assessment: Left cephalic vein fistula is occluded. Plan:  Attempt fistula declot and HD catheter if needed.

## 2011-08-18 NOTE — Progress Notes (Signed)
Clinical Child psychotherapist met with pt and pt's sister, Donise.  Pt presents as pleasantly confused.  Pt is able to state the current president, but does not know the month, the year,  where he lives, or phone numbers.  CSW inquired with sister about what level of supports are available at home; sister is unaware but will discuss with family.  CSW reviewed decision making regulations; sister agreeable.  CSW requested that sister obtain best number to contact wife so hospital staff can help pt and family coordinate care at time of dc.  CSW explained potential recommendations at time of dc.  Questions/concerns addressed.   Angelia Mould, MSW, LCSWA [phone] (For [phone] Unit CSW)

## 2011-08-18 NOTE — Progress Notes (Signed)
Hemodialysis- Pt arrived to HD post declot. AVG cannulated x3, clots pulled each time. Unable to run. Dr. Hyman Hopes notified of situation. Order to contact IR for perm cath tomorrow and make pt NPO p midnight. Telephone orders written. Pt transported back to room.

## 2011-08-18 NOTE — ED Notes (Signed)
Patient not sedated per MD request due to patients confusion and current mentation.

## 2011-08-18 NOTE — Progress Notes (Signed)
Subjective: Patient pleasantly confused, no apparent distress. Unable to provide any meaningful information. Nephrology note and internal medicine notes reviewed. Currently patient on antibiotics for C. Difficile/ pneumonia. Also has plans for declotting of fistula. Also apparently he was seen by cardiology for atrial fibrillation, remains in normal sinus rhythm. Plans at this time aspirin.  Objective: Vital signs in last 24 hours: Temp:  [97.6 F (36.4 C)-98.7 F (37.1 C)] 97.6 F (36.4 C) (05/27 1031) Pulse Rate:  [79-84] 84  (05/27 1031) Resp:  [20-21] 20  (05/27 1031) BP: (98-137)/(48-72) 130/64 mmHg (05/27 1031) SpO2:  [94 %-98 %] 94 % (05/27 1031) Weight:  [65.5 kg (144 lb 6.4 oz)] 65.5 kg (144 lb 6.4 oz) (05/26 2024) Weight change: 2.6 kg (5 lb 11.7 oz) Last BM Date: 08/17/11  Intake/Output from previous day: 05/26 0701 - 05/27 0700 In: 420 [P.O.:420] Out: -  Intake/Output this shift:    General appearance: Pleasant, no apparent distress Resp: Moderate air movement bilateral Cardio: regular rate and rhythm, S1, S2 normal, no murmur, click, rub or gallop Extremities: No thrill in fistula  Lab Results:  No results found for this or any previous visit (from the past 24 hour(s)).    Studies/Results: No results found.  Medications:  Prior to Admission:  Prescriptions prior to admission  Medication Sig Dispense Refill  . amLODipine (NORVASC) 10 MG tablet Take 10 mg by mouth daily.      Marland Kitchen aspirin EC 81 MG tablet Take 1 tablet (81 mg total) by mouth daily.  90 tablet  3  . calcium acetate (PHOSLO) 667 MG capsule Take 667-1,334 mg by mouth 3 (three) times daily with meals. 2 caps with each meal, 1 cap with snacks      . cinacalcet (SENSIPAR) 30 MG tablet Take 30 mg by mouth at bedtime.      . gabapentin (NEURONTIN) 100 MG capsule Take 100 mg by mouth 2 (two) times daily.      . isosorbide mononitrate (IMDUR) 30 MG 24 hr tablet Take 30 mg by mouth daily.      Marland Kitchen lanthanum  (FOSRENOL) 1000 MG chewable tablet Chew 1,000-2,000 mg by mouth 2 (two) times daily with a meal. 2 tabs with each meal, 1 tab with snacks      . lisinopril (PRINIVIL,ZESTRIL) 30 MG tablet Take 30 mg by mouth daily.      . metoprolol succinate (TOPROL-XL) 100 MG 24 hr tablet Take 100 mg by mouth 2 (two) times daily. Take with or immediately following a meal.      . multivitamin (RENA-VIT) TABS tablet Take 1 tablet by mouth daily.      Marland Kitchen oxyCODONE (OXY IR/ROXICODONE) 5 MG immediate release tablet Take 5-10 mg by mouth every 6 (six) hours as needed. For pain      . sevelamer (RENVELA) 800 MG tablet Take 800-2,400 mg by mouth 3 (three) times daily with meals. Take 4 tabs with meals and 1 tab with snacks       Scheduled:   . aspirin      . aspirin EC  81 mg Oral Daily  . ceFEPime (MAXIPIME) IV  1 g Intravenous Q24H  . darbepoetin (ARANESP) injection - DIALYSIS  60 mcg Intravenous Q Mon-HD  . famotidine  20 mg Oral QHS  . ferric gluconate (FERRLECIT/NULECIT) IV  125 mg Intravenous Q M,W,F-HD  . metoprolol tartrate  12.5 mg Oral BID  . metroNIDAZOLE  500 mg Oral Q8H  . sodium chloride  25 mL/kg  Intravenous Once  . vancomycin  750 mg Intravenous Q M,W,F-HD  . vancomycin  1,000 mg Intravenous Once   Continuous:   Assessment/Plan: End-stage renal disease, continue treatment per nephrology Day #5 for presumed pneumonia and C. difficile. Cognitive dysfunction, unknown baseline Leukocytosis, currently nontoxic in appearance, check CBC with differential Pleural effusion, x-ray suggests small. Recommend repeat followup x-ray Apparently resolved small bowel obstruction Lytic lesions on CAT scan, from 522 check spep, review records for age appropriate cancer screening.  LOS: 5 days   [name] D 08/18/2011, 11:21 AM

## 2011-08-19 ENCOUNTER — Encounter (HOSPITAL_COMMUNITY): Payer: Self-pay | Admitting: Radiology

## 2011-08-19 ENCOUNTER — Inpatient Hospital Stay (HOSPITAL_COMMUNITY): Payer: Medicare Other

## 2011-08-19 DIAGNOSIS — F0390 Unspecified dementia without behavioral disturbance: Secondary | ICD-10-CM

## 2011-08-19 DIAGNOSIS — A0472 Enterocolitis due to Clostridium difficile, not specified as recurrent: Secondary | ICD-10-CM

## 2011-08-19 DIAGNOSIS — D7289 Other specified disorders of white blood cells: Secondary | ICD-10-CM

## 2011-08-19 DIAGNOSIS — N186 End stage renal disease: Secondary | ICD-10-CM

## 2011-08-19 LAB — CBC
HCT: 25.5 % — ABNORMAL LOW (ref 39.0–52.0)
Hemoglobin: 8.1 g/dL — ABNORMAL LOW (ref 13.0–17.0)
MCH: 28 pg (ref 26.0–34.0)
MCV: 88.2 fL (ref 78.0–100.0)
Platelets: 237 10*3/uL (ref 150–400)
RBC: 2.89 MIL/uL — ABNORMAL LOW (ref 4.22–5.81)
WBC: 23.9 10*3/uL — ABNORMAL HIGH (ref 4.0–10.5)

## 2011-08-19 LAB — BODY FLUID CULTURE: Culture: NO GROWTH

## 2011-08-19 MED ORDER — MIDAZOLAM HCL 2 MG/2ML IJ SOLN
INTRAMUSCULAR | Status: AC
  Filled 2011-08-19: qty 4

## 2011-08-19 MED ORDER — VANCOMYCIN 50 MG/ML ORAL SOLUTION
250.0000 mg | Freq: Four times a day (QID) | ORAL | Status: DC
  Administered 2011-08-19 – 2011-08-23 (×11): 250 mg via ORAL
  Filled 2011-08-19 (×20): qty 5

## 2011-08-19 MED ORDER — ACETAMINOPHEN 325 MG PO TABS
650.0000 mg | ORAL_TABLET | Freq: Four times a day (QID) | ORAL | Status: DC | PRN
  Administered 2011-08-19 – 2011-08-22 (×3): 650 mg via ORAL
  Filled 2011-08-19: qty 1
  Filled 2011-08-19 (×2): qty 2

## 2011-08-19 MED ORDER — FENTANYL CITRATE 0.05 MG/ML IJ SOLN
INTRAMUSCULAR | Status: AC
  Filled 2011-08-19: qty 4

## 2011-08-19 NOTE — Progress Notes (Signed)
Subjective: Stable overnight, no increased wob.  Objective: Vital signs in last 24 hours: Blood pressure 137/76, pulse 92, temperature 98.9 F (37.2 C), temperature source Oral, resp. rate 16, height 5\' 6"  (1.676 m), weight 66 kg (145 lb 8.1 oz), SpO2 94.00%.  Intake/Output from previous day: 05/27 0701 - 05/28 0700 In: 270 [P.O.:120; IV Piggyback:150] Out: -    Physical Exam:  Thin male in nad Nose without purulence or discharge noted. Chest with basilar crackles, o/w clear Cor with cvr Alert, oriented, moves all 4.    Lab Results:  Basename 08/17/11 0844 08/16/11 1358  WBC 32.2* 25.6*  HGB 8.6* 9.3*  HCT 27.2* 28.8*  PLT 225 197   BMET  Basename 08/17/11 0844 08/16/11 1358  NA 141 141  K 4.0 4.1  CL 101 100  CO2 25 23  GLUCOSE 118* 133*  BUN 54* 71*  CREATININE 6.77* 7.77*  CALCIUM 9.3 9.2    Studies/Results: Ir Pta Venous Left  08/18/2011  *RADIOLOGY REPORT*  Clinical history:End-stage renal disease and clotted left upper extremity cephalic vein fistula.  PROCEDURE(S): DIALYSIS  FISTULA DECLOT; CEPHALIC VEIN ANGIOPLASTY; ULTRASOUND GUIDANCE FOR VASCULAR ACCESS  Physician: Rachelle Hora. Henn, MD  Medications:Heparin 3000 units, TPA 2 mg  Fluoroscopy time: 22.5 minutes  Contrast: 100 ml Omnipaque 300  Procedure:Informed consent was obtained for a declot procedure. The left upper arm was prepped and draped in a sterile fashion. Maximal barrier sterile technique was utilized including caps, mask, sterile gowns, sterile gloves, sterile drape, hand hygiene and skin antiseptic.  The skin was anesthetized with 1% lidocaine. The fistula was accessed using a 21 gauge needle towards the central venous system with ultrasound guidance.  Ultrasound images were obtained for documentation. Micropuncture catheter was placed. A 6-French vascular sheath was placed.  A Kumpe catheter was advanced in the central veins using a Glidewire.  Central venogram was performed.  The catheter was pulled  back as TPA was infused within the cephalic vein thrombus.  Fluoroscopic images were saved for documentation. A Bentson wire was placed centrally and the cephalic vein was treated with the AngioJet thrombectomy device. The cephalic vein was also angioplastied with a 6 mm x 40 mm balloon.  Thrombus was treated again with the AngioJet device and the PTD thrombectomy device.  Using ultrasound guidance, a 21 gauge needle was directed into the vein towards the arterial anastomosis.  Micropuncture dilator set was placed and a 6 [place] vascular sheath was placed. Although there was already flow from the arterial system, a Fogarty balloon was pulled through the vein in order to dislodge thrombus. There was improved flow within the fistula after using the Fogarty balloon.  The central cephalic vein was angioplastied with 6 mm x 40 mm balloon.  At this point, there was flow throughout the fistula.  However, the flow was only temporary.  The central cephalic vein and mid humeral cephalic vein required angioplasty multiple times in order to keep the cephalic vein patent.   At the end of the procedure, there was a pulse within the cephalic vein but poor outflow.  The vascular sheaths were removed with pursestring sutures.  Findings:The left cephalic vein fistula anastomosis is near the elbow.  The vein was patent near the arterial anastomosis but occluded in the mid and upper arm.  The central cephalic vein was also patent.  A large amount of the thrombus was removed using the AngioJet and mechanical thrombectomy device.  Eventually, there was some flow throughout the fistula after  balloon angioplasty of a tight stenosis in the central cephalic vein and recurrent stenosis in the mid humeral cephalic vein. At the end of the procedure, there was still a large amount of thrombus within the aneurysmal segment of the mid humeral cephalic vein.  Partial recanalization of the upper left arm cephalic vein.  Impression:Declot of the left  upper extremity fistula.  There is improved flow through the fistula but the outflow is tenuous and high risk to occlude again.  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  Discussed the declot procedure with Dr. Hyman Hopes.   Recommend trying the fistula for dialysis.  If the fistula occludes again, recommend a catheter and surgical consultation for a new access.  Original Report Authenticated By: Richarda Overlie, M.D.   Ir Av Dialysis Graft Declot  08/18/2011  *RADIOLOGY REPORT*  Clinical history:End-stage renal disease and clotted left upper extremity cephalic vein fistula.  PROCEDURE(S): DIALYSIS  FISTULA DECLOT; CEPHALIC VEIN ANGIOPLASTY; ULTRASOUND GUIDANCE FOR VASCULAR ACCESS  Physician: Rachelle Hora. Henn, MD  Medications:Heparin 3000 units, TPA 2 mg  Fluoroscopy time: 22.5 minutes  Contrast: 100 ml Omnipaque 300  Procedure:Informed consent was obtained for a declot procedure. The left upper arm was prepped and draped in a sterile fashion. Maximal barrier sterile technique was utilized including caps, mask, sterile gowns, sterile gloves, sterile drape, hand hygiene and skin antiseptic.  The skin was anesthetized with 1% lidocaine. The fistula was accessed using a 21 gauge needle towards the central venous system with ultrasound guidance.  Ultrasound images were obtained for documentation. Micropuncture catheter was placed. A 6-French vascular sheath was placed.  A Kumpe catheter was advanced in the central veins using a Glidewire.  Central venogram was performed.  The catheter was pulled back as TPA was infused within the cephalic vein thrombus.  Fluoroscopic images were saved for documentation. A Bentson wire was placed centrally and the cephalic vein was treated with the AngioJet thrombectomy device. The cephalic vein was also angioplastied with a 6 mm x 40 mm balloon.  Thrombus was treated again with the AngioJet device and the PTD thrombectomy device.  Using ultrasound guidance, a 21 gauge  needle was directed into the vein towards the arterial anastomosis.  Micropuncture dilator set was placed and a 6 [place] vascular sheath was placed. Although there was already flow from the arterial system, a Fogarty balloon was pulled through the vein in order to dislodge thrombus. There was improved flow within the fistula after using the Fogarty balloon.  The central cephalic vein was angioplastied with 6 mm x 40 mm balloon.  At this point, there was flow throughout the fistula.  However, the flow was only temporary.  The central cephalic vein and mid humeral cephalic vein required angioplasty multiple times in order to keep the cephalic vein patent.   At the end of the procedure, there was a pulse within the cephalic vein but poor outflow.  The vascular sheaths were removed with pursestring sutures.  Findings:The left cephalic vein fistula anastomosis is near the elbow.  The vein was patent near the arterial anastomosis but occluded in the mid and upper arm.  The central cephalic vein was also patent.  A large amount of the thrombus was removed using the AngioJet and mechanical thrombectomy device.  Eventually, there was some flow throughout the fistula after balloon angioplasty of a tight stenosis in the central cephalic vein and recurrent stenosis in the mid humeral cephalic vein. At the end of the procedure, there  was still a large amount of thrombus within the aneurysmal segment of the mid humeral cephalic vein.  Partial recanalization of the upper left arm cephalic vein.  Impression:Declot of the left upper extremity fistula.  There is improved flow through the fistula but the outflow is tenuous and high risk to occlude again.  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  Discussed the declot procedure with Dr. Hyman Hopes.   Recommend trying the fistula for dialysis.  If the fistula occludes again, recommend a catheter and surgical consultation for a new access.  Original Report  Authenticated By: Richarda Overlie, M.D.   Ir Angio Av Shunt Addl Access  08/18/2011  *RADIOLOGY REPORT*  Clinical history:End-stage renal disease and clotted left upper extremity cephalic vein fistula.  PROCEDURE(S): DIALYSIS  FISTULA DECLOT; CEPHALIC VEIN ANGIOPLASTY; ULTRASOUND GUIDANCE FOR VASCULAR ACCESS  Physician: Rachelle Hora. Henn, MD  Medications:Heparin 3000 units, TPA 2 mg  Fluoroscopy time: 22.5 minutes  Contrast: 100 ml Omnipaque 300  Procedure:Informed consent was obtained for a declot procedure. The left upper arm was prepped and draped in a sterile fashion. Maximal barrier sterile technique was utilized including caps, mask, sterile gowns, sterile gloves, sterile drape, hand hygiene and skin antiseptic.  The skin was anesthetized with 1% lidocaine. The fistula was accessed using a 21 gauge needle towards the central venous system with ultrasound guidance.  Ultrasound images were obtained for documentation. Micropuncture catheter was placed. A 6-French vascular sheath was placed.  A Kumpe catheter was advanced in the central veins using a Glidewire.  Central venogram was performed.  The catheter was pulled back as TPA was infused within the cephalic vein thrombus.  Fluoroscopic images were saved for documentation. A Bentson wire was placed centrally and the cephalic vein was treated with the AngioJet thrombectomy device. The cephalic vein was also angioplastied with a 6 mm x 40 mm balloon.  Thrombus was treated again with the AngioJet device and the PTD thrombectomy device.  Using ultrasound guidance, a 21 gauge needle was directed into the vein towards the arterial anastomosis.  Micropuncture dilator set was placed and a 6 [place] vascular sheath was placed. Although there was already flow from the arterial system, a Fogarty balloon was pulled through the vein in order to dislodge thrombus. There was improved flow within the fistula after using the Fogarty balloon.  The central cephalic vein was angioplastied  with 6 mm x 40 mm balloon.  At this point, there was flow throughout the fistula.  However, the flow was only temporary.  The central cephalic vein and mid humeral cephalic vein required angioplasty multiple times in order to keep the cephalic vein patent.   At the end of the procedure, there was a pulse within the cephalic vein but poor outflow.  The vascular sheaths were removed with pursestring sutures.  Findings:The left cephalic vein fistula anastomosis is near the elbow.  The vein was patent near the arterial anastomosis but occluded in the mid and upper arm.  The central cephalic vein was also patent.  A large amount of the thrombus was removed using the AngioJet and mechanical thrombectomy device.  Eventually, there was some flow throughout the fistula after balloon angioplasty of a tight stenosis in the central cephalic vein and recurrent stenosis in the mid humeral cephalic vein. At the end of the procedure, there was still a large amount of thrombus within the aneurysmal segment of the mid humeral cephalic vein.  Partial recanalization of the upper left arm cephalic  vein.  Impression:Declot of the left upper extremity fistula.  There is improved flow through the fistula but the outflow is tenuous and high risk to occlude again.  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  Discussed the declot procedure with Dr. Hyman Hopes.   Recommend trying the fistula for dialysis.  If the fistula occludes again, recommend a catheter and surgical consultation for a new access.  Original Report Authenticated By: Richarda Overlie, M.D.   Ir US Guide Vasc Access Left  08/18/2011  *RADIOLOGY REPORT*  Clinical history:End-stage renal disease and clotted left upper extremity cephalic vein fistula.  PROCEDURE(S): DIALYSIS  FISTULA DECLOT; CEPHALIC VEIN ANGIOPLASTY; ULTRASOUND GUIDANCE FOR VASCULAR ACCESS  Physician: Rachelle Hora. Henn, MD  Medications:Heparin 3000 units, TPA 2 mg  Fluoroscopy time: 22.5 minutes   Contrast: 100 ml Omnipaque 300  Procedure:Informed consent was obtained for a declot procedure. The left upper arm was prepped and draped in a sterile fashion. Maximal barrier sterile technique was utilized including caps, mask, sterile gowns, sterile gloves, sterile drape, hand hygiene and skin antiseptic.  The skin was anesthetized with 1% lidocaine. The fistula was accessed using a 21 gauge needle towards the central venous system with ultrasound guidance.  Ultrasound images were obtained for documentation. Micropuncture catheter was placed. A 6-French vascular sheath was placed.  A Kumpe catheter was advanced in the central veins using a Glidewire.  Central venogram was performed.  The catheter was pulled back as TPA was infused within the cephalic vein thrombus.  Fluoroscopic images were saved for documentation. A Bentson wire was placed centrally and the cephalic vein was treated with the AngioJet thrombectomy device. The cephalic vein was also angioplastied with a 6 mm x 40 mm balloon.  Thrombus was treated again with the AngioJet device and the PTD thrombectomy device.  Using ultrasound guidance, a 21 gauge needle was directed into the vein towards the arterial anastomosis.  Micropuncture dilator set was placed and a 6 [place] vascular sheath was placed. Although there was already flow from the arterial system, a Fogarty balloon was pulled through the vein in order to dislodge thrombus. There was improved flow within the fistula after using the Fogarty balloon.  The central cephalic vein was angioplastied with 6 mm x 40 mm balloon.  At this point, there was flow throughout the fistula.  However, the flow was only temporary.  The central cephalic vein and mid humeral cephalic vein required angioplasty multiple times in order to keep the cephalic vein patent.   At the end of the procedure, there was a pulse within the cephalic vein but poor outflow.  The vascular sheaths were removed with pursestring sutures.   Findings:The left cephalic vein fistula anastomosis is near the elbow.  The vein was patent near the arterial anastomosis but occluded in the mid and upper arm.  The central cephalic vein was also patent.  A large amount of the thrombus was removed using the AngioJet and mechanical thrombectomy device.  Eventually, there was some flow throughout the fistula after balloon angioplasty of a tight stenosis in the central cephalic vein and recurrent stenosis in the mid humeral cephalic vein. At the end of the procedure, there was still a large amount of thrombus within the aneurysmal segment of the mid humeral cephalic vein.  Partial recanalization of the upper left arm cephalic vein.  Impression:Declot of the left upper extremity fistula.  There is improved flow through the fistula but the outflow is tenuous and high risk to  occlude again.  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  Discussed the declot procedure with Dr. Hyman Hopes.   Recommend trying the fistula for dialysis.  If the fistula occludes again, recommend a catheter and surgical consultation for a new access.  Original Report Authenticated By: Richarda Overlie, M.D.    Assessment/Plan:  Pleural effusion: It is a transudate by LDH and mild exudate by total protein.  Cell count and diff are nonspecific.  His glucose is low in the fluid, but was also on the low end in serum at the time.  His culture and cytology were both unremarkable.  I suspect this is related to his known CHF and chronic renal failure, but if re-accumulates would retap vs ?pleurx catheter.  Nothing further to add.  Will sign off.    Barbaraann Share, M.D. 08/19/2011, 8:40 AM

## 2011-08-19 NOTE — Progress Notes (Signed)
[facility] ROUNDING NOTE   Subjective:   Interval History: none.  Objective:  Vital signs in last 24 hours:  Temp:  [97.4 F (36.3 C)-98.9 F (37.2 C)] 98.6 F (37 C) (05/28 0900) Pulse Rate:  [75-92] 89  (05/28 0900) Resp:  [9-18] 18  (05/28 0900) BP: (91-137)/(29-88) 130/72 mmHg (05/28 0900) SpO2:  [90 %-100 %] 96 % (05/28 0900) Weight:  [66 kg (145 lb 8.1 oz)-67.6 kg (149 lb 0.5 oz)] 66 kg (145 lb 8.1 oz) (05/27 2103)  Weight change: 2.1 kg (4 lb 10.1 oz) Filed Weights   08/17/11 2024 08/18/11 1541 08/18/11 2103  Weight: 65.5 kg (144 lb 6.4 oz) 67.6 kg (149 lb 0.5 oz) 66 kg (145 lb 8.1 oz)    Intake/Output: I/O last 3 completed shifts: In: 270 [P.O.:120; IV Piggyback:150] Out: -    Intake/Output this shift:     CVS- RRR RS- CTA ABD- BS present soft non-distended EXT- no edema   Basic Metabolic Panel:  Lab 08/17/11 1610 08/16/11 1358 08/16/11 0430 08/15/11 0430 08/14/11 0435  NA 141 141 143 142 141  K 4.0 4.1 4.1 4.2 4.8  CL 101 100 102 100 96  CO2 25 23 22 25 25   GLUCOSE 118* 133* 141* 97 92  BUN 54* 71* 69* 56* 89*  CREATININE 6.77* 7.77* 7.27* 5.96* 8.77*  CALCIUM 9.3 9.2 9.2 -- --  MG -- -- 2.0 -- 1.8  PHOS 4.4 5.4* 5.8* 6.1* 7.8*    Liver Function Tests:  Lab 08/17/11 0844 08/16/11 1358 08/16/11 0430 08/15/11 0430 08/13/11 1530  AST -- -- 19 -- 39*  ALT -- -- 10 -- 12  ALKPHOS -- -- 96 -- 64  BILITOT -- -- 0.3 -- 0.5  PROT -- -- 5.0* -- 5.9*  ALBUMIN 2.2* 2.0* 1.9* 2.0* 2.2*    Lab 08/13/11 1530  LIPASE 5*  AMYLASE --   No results found for this basename: AMMONIA:3 in the last 168 hours  CBC:  Lab 08/17/11 0844 08/16/11 1358 08/16/11 0430 08/15/11 0430 08/14/11 0435 08/13/11 1427  WBC 32.2* 25.6* 21.8* 14.2* 8.8 --  NEUTROABS -- -- -- -- -- 10.4*  HGB 8.6* 9.3* 8.6* 8.3* 9.0* --  HCT 27.2* 28.8* 27.2* 25.8* 27.4* --  MCV 87.5 87.8 87.2 86.0 84.8 --  PLT 225 197 247 270 320 --    Cardiac Enzymes:  Lab 08/14/11  1505 08/14/11 0435 08/13/11 2315  CKTOTAL 254* 410* 614*  CKMB 4.4* 5.7* 5.7*  CKMBINDEX -- -- --  TROPONINI <0.30 <0.30 <0.30    BNP: No components found with this basename: POCBNP:5  CBG:  Lab 08/16/11 1742 08/16/11 1151 08/16/11 0722 08/16/11 0510 08/16/11 0350  GLUCAP 77 100* 117* 102* 68*    Microbiology: Results for orders placed during the hospital encounter of 08/13/11  CULTURE, BLOOD (ROUTINE X 2)     Status: Normal   Collection Time   08/13/11  3:15 PM      Component Value Range Status Comment   Specimen Description BLOOD CENTRAL LINE   Final    Special Requests BOTTLES DRAWN AEROBIC AND ANAEROBIC   Final    Culture  Setup Time [id number]   Final    Culture     Final    Value: DIPHTHEROIDS(CORYNEBACTERIUM SPECIES)     Note: Standardized susceptibility testing for this organism is not available.     Note: Gram Stain Report Called to,Read Back By and Verified With: GENIVEVE WROBLEWSKI @1150  08/15/11 BY KRAWS  Report Status 08/16/2011 FINAL   Final   CULTURE, BLOOD (ROUTINE X 2)     Status: Normal (Preliminary result)   Collection Time   08/13/11  3:50 PM      Component Value Range Status Comment   Specimen Description BLOOD HAND RIGHT   Final    Special Requests BOTTLES DRAWN AEROBIC ONLY 5CC   Final    Culture  Setup Time [id number]   Final    Culture     Final    Value:        BLOOD CULTURE RECEIVED NO GROWTH TO DATE CULTURE WILL BE HELD FOR 5 DAYS BEFORE ISSUING A FINAL NEGATIVE REPORT   Report Status PENDING   Incomplete   MRSA PCR SCREENING     Status: Normal   Collection Time   08/13/11  6:50 PM      Component Value Range Status Comment   MRSA by PCR NEGATIVE  NEGATIVE  Final   CLOSTRIDIUM DIFFICILE BY PCR     Status: Abnormal   Collection Time   08/13/11 10:46 PM      Component Value Range Status Comment   C difficile by pcr POSITIVE (*) NEGATIVE  Final   BODY FLUID CULTURE     Status: Normal (Preliminary result)   Collection Time   08/15/11   5:58 PM      Component Value Range Status Comment   Specimen Description PLEURAL FLUID RIGHT   Final    Special Requests NONE   Final    Gram Stain     Final    Value: RARE WBC PRESENT,BOTH PMN AND MONONUCLEAR     NO ORGANISMS SEEN   Culture NO GROWTH 2 DAYS   Final    Report Status PENDING   Incomplete     Coagulation Studies: No results found for this basename: LABPROT:5,INR:5 in the last 72 hours  Urinalysis: No results found for this basename: COLORURINE:2,APPERANCEUR:2,LABSPEC:2,PHURINE:2,GLUCOSEU:2,HGBUR:2,BILIRUBINUR:2,KETONESUR:2,PROTEINUR:2,UROBILINOGEN:2,NITRITE:2,LEUKOCYTESUR:2 in the last 72 hours    Imaging: Ir Pta Venous Left  08/18/2011  *RADIOLOGY REPORT*  Clinical history:End-stage renal disease and clotted left upper extremity cephalic vein fistula.  PROCEDURE(S): DIALYSIS  FISTULA DECLOT; CEPHALIC VEIN ANGIOPLASTY; ULTRASOUND GUIDANCE FOR VASCULAR ACCESS  Physician: Rachelle Hora. Henn, MD  Medications:Heparin 3000 units, TPA 2 mg  Fluoroscopy time: 22.5 minutes  Contrast: 100 ml Omnipaque 300  Procedure:Informed consent was obtained for a declot procedure. The left upper arm was prepped and draped in a sterile fashion. Maximal barrier sterile technique was utilized including caps, mask, sterile gowns, sterile gloves, sterile drape, hand hygiene and skin antiseptic.  The skin was anesthetized with 1% lidocaine. The fistula was accessed using a 21 gauge needle towards the central venous system with ultrasound guidance.  Ultrasound images were obtained for documentation. Micropuncture catheter was placed. A 6-French vascular sheath was placed.  A Kumpe catheter was advanced in the central veins using a Glidewire.  Central venogram was performed.  The catheter was pulled back as TPA was infused within the cephalic vein thrombus.  Fluoroscopic images were saved for documentation. A Bentson wire was placed centrally and the cephalic vein was treated with the AngioJet thrombectomy device.  The cephalic vein was also angioplastied with a 6 mm x 40 mm balloon.  Thrombus was treated again with the AngioJet device and the PTD thrombectomy device.  Using ultrasound guidance, a 21 gauge needle was directed into the vein towards the arterial anastomosis.  Micropuncture dilator set was placed and a 6 [place] vascular sheath  was placed. Although there was already flow from the arterial system, a Fogarty balloon was pulled through the vein in order to dislodge thrombus. There was improved flow within the fistula after using the Fogarty balloon.  The central cephalic vein was angioplastied with 6 mm x 40 mm balloon.  At this point, there was flow throughout the fistula.  However, the flow was only temporary.  The central cephalic vein and mid humeral cephalic vein required angioplasty multiple times in order to keep the cephalic vein patent.   At the end of the procedure, there was a pulse within the cephalic vein but poor outflow.  The vascular sheaths were removed with pursestring sutures.  Findings:The left cephalic vein fistula anastomosis is near the elbow.  The vein was patent near the arterial anastomosis but occluded in the mid and upper arm.  The central cephalic vein was also patent.  A large amount of the thrombus was removed using the AngioJet and mechanical thrombectomy device.  Eventually, there was some flow throughout the fistula after balloon angioplasty of a tight stenosis in the central cephalic vein and recurrent stenosis in the mid humeral cephalic vein. At the end of the procedure, there was still a large amount of thrombus within the aneurysmal segment of the mid humeral cephalic vein.  Partial recanalization of the upper left arm cephalic vein.  Impression:Declot of the left upper extremity fistula.  There is improved flow through the fistula but the outflow is tenuous and high risk to occlude again.  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  Discussed the  declot procedure with Dr. Hyman Hopes.   Recommend trying the fistula for dialysis.  If the fistula occludes again, recommend a catheter and surgical consultation for a new access.  Original Report Authenticated By: Richarda Overlie, M.D.   Ir Av Dialysis Graft Declot  08/18/2011  *RADIOLOGY REPORT*  Clinical history:End-stage renal disease and clotted left upper extremity cephalic vein fistula.  PROCEDURE(S): DIALYSIS  FISTULA DECLOT; CEPHALIC VEIN ANGIOPLASTY; ULTRASOUND GUIDANCE FOR VASCULAR ACCESS  Physician: Rachelle Hora. Henn, MD  Medications:Heparin 3000 units, TPA 2 mg  Fluoroscopy time: 22.5 minutes  Contrast: 100 ml Omnipaque 300  Procedure:Informed consent was obtained for a declot procedure. The left upper arm was prepped and draped in a sterile fashion. Maximal barrier sterile technique was utilized including caps, mask, sterile gowns, sterile gloves, sterile drape, hand hygiene and skin antiseptic.  The skin was anesthetized with 1% lidocaine. The fistula was accessed using a 21 gauge needle towards the central venous system with ultrasound guidance.  Ultrasound images were obtained for documentation. Micropuncture catheter was placed. A 6-French vascular sheath was placed.  A Kumpe catheter was advanced in the central veins using a Glidewire.  Central venogram was performed.  The catheter was pulled back as TPA was infused within the cephalic vein thrombus.  Fluoroscopic images were saved for documentation. A Bentson wire was placed centrally and the cephalic vein was treated with the AngioJet thrombectomy device. The cephalic vein was also angioplastied with a 6 mm x 40 mm balloon.  Thrombus was treated again with the AngioJet device and the PTD thrombectomy device.  Using ultrasound guidance, a 21 gauge needle was directed into the vein towards the arterial anastomosis.  Micropuncture dilator set was placed and a 6 [place] vascular sheath was placed. Although there was already flow from the arterial system, a Fogarty  balloon was pulled through the vein in order to dislodge thrombus. There was improved  flow within the fistula after using the Fogarty balloon.  The central cephalic vein was angioplastied with 6 mm x 40 mm balloon.  At this point, there was flow throughout the fistula.  However, the flow was only temporary.  The central cephalic vein and mid humeral cephalic vein required angioplasty multiple times in order to keep the cephalic vein patent.   At the end of the procedure, there was a pulse within the cephalic vein but poor outflow.  The vascular sheaths were removed with pursestring sutures.  Findings:The left cephalic vein fistula anastomosis is near the elbow.  The vein was patent near the arterial anastomosis but occluded in the mid and upper arm.  The central cephalic vein was also patent.  A large amount of the thrombus was removed using the AngioJet and mechanical thrombectomy device.  Eventually, there was some flow throughout the fistula after balloon angioplasty of a tight stenosis in the central cephalic vein and recurrent stenosis in the mid humeral cephalic vein. At the end of the procedure, there was still a large amount of thrombus within the aneurysmal segment of the mid humeral cephalic vein.  Partial recanalization of the upper left arm cephalic vein.  Impression:Declot of the left upper extremity fistula.  There is improved flow through the fistula but the outflow is tenuous and high risk to occlude again.  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  Discussed the declot procedure with Dr. Hyman Hopes.   Recommend trying the fistula for dialysis.  If the fistula occludes again, recommend a catheter and surgical consultation for a new access.  Original Report Authenticated By: Richarda Overlie, M.D.   Ir Angio Av Shunt Addl Access  08/18/2011  *RADIOLOGY REPORT*  Clinical history:End-stage renal disease and clotted left upper extremity cephalic vein fistula.  PROCEDURE(S): DIALYSIS   FISTULA DECLOT; CEPHALIC VEIN ANGIOPLASTY; ULTRASOUND GUIDANCE FOR VASCULAR ACCESS  Physician: Rachelle Hora. Henn, MD  Medications:Heparin 3000 units, TPA 2 mg  Fluoroscopy time: 22.5 minutes  Contrast: 100 ml Omnipaque 300  Procedure:Informed consent was obtained for a declot procedure. The left upper arm was prepped and draped in a sterile fashion. Maximal barrier sterile technique was utilized including caps, mask, sterile gowns, sterile gloves, sterile drape, hand hygiene and skin antiseptic.  The skin was anesthetized with 1% lidocaine. The fistula was accessed using a 21 gauge needle towards the central venous system with ultrasound guidance.  Ultrasound images were obtained for documentation. Micropuncture catheter was placed. A 6-French vascular sheath was placed.  A Kumpe catheter was advanced in the central veins using a Glidewire.  Central venogram was performed.  The catheter was pulled back as TPA was infused within the cephalic vein thrombus.  Fluoroscopic images were saved for documentation. A Bentson wire was placed centrally and the cephalic vein was treated with the AngioJet thrombectomy device. The cephalic vein was also angioplastied with a 6 mm x 40 mm balloon.  Thrombus was treated again with the AngioJet device and the PTD thrombectomy device.  Using ultrasound guidance, a 21 gauge needle was directed into the vein towards the arterial anastomosis.  Micropuncture dilator set was placed and a 6 [place] vascular sheath was placed. Although there was already flow from the arterial system, a Fogarty balloon was pulled through the vein in order to dislodge thrombus. There was improved flow within the fistula after using the Fogarty balloon.  The central cephalic vein was angioplastied with 6 mm x 40 mm balloon.  At this point,  there was flow throughout the fistula.  However, the flow was only temporary.  The central cephalic vein and mid humeral cephalic vein required angioplasty multiple times in order  to keep the cephalic vein patent.   At the end of the procedure, there was a pulse within the cephalic vein but poor outflow.  The vascular sheaths were removed with pursestring sutures.  Findings:The left cephalic vein fistula anastomosis is near the elbow.  The vein was patent near the arterial anastomosis but occluded in the mid and upper arm.  The central cephalic vein was also patent.  A large amount of the thrombus was removed using the AngioJet and mechanical thrombectomy device.  Eventually, there was some flow throughout the fistula after balloon angioplasty of a tight stenosis in the central cephalic vein and recurrent stenosis in the mid humeral cephalic vein. At the end of the procedure, there was still a large amount of thrombus within the aneurysmal segment of the mid humeral cephalic vein.  Partial recanalization of the upper left arm cephalic vein.  Impression:Declot of the left upper extremity fistula.  There is improved flow through the fistula but the outflow is tenuous and high risk to occlude again.  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  Discussed the declot procedure with Dr. Hyman Hopes.   Recommend trying the fistula for dialysis.  If the fistula occludes again, recommend a catheter and surgical consultation for a new access.  Original Report Authenticated By: Richarda Overlie, M.D.   Ir US Guide Vasc Access Left  08/18/2011  *RADIOLOGY REPORT*  Clinical history:End-stage renal disease and clotted left upper extremity cephalic vein fistula.  PROCEDURE(S): DIALYSIS  FISTULA DECLOT; CEPHALIC VEIN ANGIOPLASTY; ULTRASOUND GUIDANCE FOR VASCULAR ACCESS  Physician: Rachelle Hora. Henn, MD  Medications:Heparin 3000 units, TPA 2 mg  Fluoroscopy time: 22.5 minutes  Contrast: 100 ml Omnipaque 300  Procedure:Informed consent was obtained for a declot procedure. The left upper arm was prepped and draped in a sterile fashion. Maximal barrier sterile technique was utilized including caps,  mask, sterile gowns, sterile gloves, sterile drape, hand hygiene and skin antiseptic.  The skin was anesthetized with 1% lidocaine. The fistula was accessed using a 21 gauge needle towards the central venous system with ultrasound guidance.  Ultrasound images were obtained for documentation. Micropuncture catheter was placed. A 6-French vascular sheath was placed.  A Kumpe catheter was advanced in the central veins using a Glidewire.  Central venogram was performed.  The catheter was pulled back as TPA was infused within the cephalic vein thrombus.  Fluoroscopic images were saved for documentation. A Bentson wire was placed centrally and the cephalic vein was treated with the AngioJet thrombectomy device. The cephalic vein was also angioplastied with a 6 mm x 40 mm balloon.  Thrombus was treated again with the AngioJet device and the PTD thrombectomy device.  Using ultrasound guidance, a 21 gauge needle was directed into the vein towards the arterial anastomosis.  Micropuncture dilator set was placed and a 6 [place] vascular sheath was placed. Although there was already flow from the arterial system, a Fogarty balloon was pulled through the vein in order to dislodge thrombus. There was improved flow within the fistula after using the Fogarty balloon.  The central cephalic vein was angioplastied with 6 mm x 40 mm balloon.  At this point, there was flow throughout the fistula.  However, the flow was only temporary.  The central cephalic vein and mid humeral cephalic vein required angioplasty multiple  times in order to keep the cephalic vein patent.   At the end of the procedure, there was a pulse within the cephalic vein but poor outflow.  The vascular sheaths were removed with pursestring sutures.  Findings:The left cephalic vein fistula anastomosis is near the elbow.  The vein was patent near the arterial anastomosis but occluded in the mid and upper arm.  The central cephalic vein was also patent.  A large amount of  the thrombus was removed using the AngioJet and mechanical thrombectomy device.  Eventually, there was some flow throughout the fistula after balloon angioplasty of a tight stenosis in the central cephalic vein and recurrent stenosis in the mid humeral cephalic vein. At the end of the procedure, there was still a large amount of thrombus within the aneurysmal segment of the mid humeral cephalic vein.  Partial recanalization of the upper left arm cephalic vein.  Impression:Declot of the left upper extremity fistula.  There is improved flow through the fistula but the outflow is tenuous and high risk to occlude again.  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  Discussed the declot procedure with Dr. Hyman Hopes.   Recommend trying the fistula for dialysis.  If the fistula occludes again, recommend a catheter and surgical consultation for a new access.  Original Report Authenticated By: Richarda Overlie, M.D.     Medications:        . alteplase  2 mg Intracatheter Once  . aspirin EC  81 mg Oral Daily  . ceFEPime (MAXIPIME) IV  1 g Intravenous Q24H  . darbepoetin (ARANESP) injection - DIALYSIS  60 mcg Intravenous Q Mon-HD  . famotidine  20 mg Oral QHS  . ferric gluconate (FERRLECIT/NULECIT) IV  125 mg Intravenous Q M,[name],F-HD  . metoprolol tartrate  12.5 mg Oral BID  . metroNIDAZOLE  500 mg Oral Q8H  . sodium chloride  25 mL/kg Intravenous Once  . vancomycin  250 mg Oral Q6H  . vancomycin  750 mg Intravenous Q M,[name],F-HD  . vancomycin  1,000 mg Intravenous Once   acetaminophen, heparin, heparin, iohexol, metoprolol  Assessment/ Plan:  1. ESRD / usual MWF [place]- L upper arm AVF is clotted.temporary dialysis catheter 2. GI- no evidence SBO by xray, nonspecific BGP, formed stool yesterday. Wants to eat, ordered renal clear liquids, adv as tolerated 3. Leukocytosis- WBC up to 32K today. Does not look ill, maybe response to Cdif.  4. Lytic lesion right pelvis and abnormal appearance  on CT of both kidneys - will need [name]/u  5. Peripheral Artery Disease - keep eye on left toe 6. Anemia - no idea of baseline Hb and cannot get info from HD unit. 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 AFib/flutter - per primary 9. CDiff positive-on vanco and flagyl Plan dialysis after catheter   LOS: 6 [name] @TODAY @11 :22 AM

## 2011-08-19 NOTE — Progress Notes (Signed)
Utilization review completed. Crosby Bevan RN, CCM  

## 2011-08-19 NOTE — Progress Notes (Addendum)
ANTIBIOTIC CONSULT NOTE - FOLLOW UP  Pharmacy Consult for Vancomycin, Cefepime Indication: sepsis/?PNA  No Known Allergies  Patient Measurements: Height: 5\' 6"  (167.6 cm) Weight: 145 lb 8.1 oz (66 kg) IBW/kg (Calculated) : 63.8   Vital Signs: Temp: 98.9 F (37.2 C) (05/28 0541) Temp src: Oral (05/28 0541) BP: 137/76 mmHg (05/28 0541) Pulse Rate: 92  (05/28 0541) Intake/Output from previous day: 05/27 0701 - 05/28 0700 In: 270 [P.O.:120; IV Piggyback:150] Out: -  Intake/Output from this shift:    Labs:  Basename 08/17/11 0844 08/16/11 1358  WBC 32.2* 25.6*  HGB 8.6* 9.3*  PLT 225 197  LABCREA -- --  CREATININE 6.77* 7.77*   Estimated Creatinine Clearance: 10.2 ml/min (by C-G formula based on Cr of 6.77).  Microbiology: Recent Results (from the past 720 hour(s))  CULTURE, BLOOD (ROUTINE X 2)     Status: Normal   Collection Time   08/13/11  3:15 PM      Component Value Range Status Comment   Specimen Description BLOOD CENTRAL LINE   Final    Special Requests BOTTLES DRAWN AEROBIC AND ANAEROBIC   Final    Culture  Setup Time [id number]   Final    Culture     Final    Value: DIPHTHEROIDS(CORYNEBACTERIUM SPECIES)     Note: Standardized susceptibility testing for this organism is not available.     Note: Gram Stain Report Called to,Read Back By and Verified With: GENIVEVE WROBLEWSKI @1150  08/15/11 BY KRAWS   Report Status 08/16/2011 FINAL   Final   CULTURE, BLOOD (ROUTINE X 2)     Status: Normal (Preliminary result)   Collection Time   08/13/11  3:50 PM      Component Value Range Status Comment   Specimen Description BLOOD HAND RIGHT   Final    Special Requests BOTTLES DRAWN AEROBIC ONLY 5CC   Final    Culture  Setup Time [id number]   Final    Culture     Final    Value:        BLOOD CULTURE RECEIVED NO GROWTH TO DATE CULTURE WILL BE HELD FOR 5 DAYS BEFORE ISSUING A FINAL NEGATIVE REPORT   Report Status PENDING   Incomplete   MRSA PCR SCREENING     Status:  Normal   Collection Time   08/13/11  6:50 PM      Component Value Range Status Comment   MRSA by PCR NEGATIVE  NEGATIVE  Final   CLOSTRIDIUM DIFFICILE BY PCR     Status: Abnormal   Collection Time   08/13/11 10:46 PM      Component Value Range Status Comment   C difficile by pcr POSITIVE (*) NEGATIVE  Final   BODY FLUID CULTURE     Status: Normal (Preliminary result)   Collection Time   08/15/11  5:58 PM      Component Value Range Status Comment   Specimen Description PLEURAL FLUID RIGHT   Final    Special Requests NONE   Final    Gram Stain     Final    Value: RARE WBC PRESENT,BOTH PMN AND MONONUCLEAR     NO ORGANISMS SEEN   Culture NO GROWTH 2 DAYS   Final    Report Status PENDING   Incomplete     Anti-infectives     Start     Dose/Rate Route Frequency Ordered Stop   08/19/11 1000   vancomycin (VANCOCIN) 50 mg/mL oral solution 250 mg  250 mg Oral 4 times per day 08/19/11 0843     08/18/11 1200   vancomycin (VANCOCIN) 750 mg in sodium chloride 0.9 % 150 mL IVPB        750 mg 150 mL/hr over 60 Minutes Intravenous Every M-W-F (Hemodialysis) 08/18/11 1006     08/16/11 2200   vancomycin (VANCOCIN) 750 mg in sodium chloride 0.9 % 150 mL IVPB        750 mg 150 mL/hr over 60 Minutes Intravenous  Once 08/16/11 1046 08/16/11 2342   08/16/11 1100   metroNIDAZOLE (FLAGYL) tablet 500 mg        500 mg Oral 3 times per day 08/16/11 0943     08/14/11 1200   vancomycin (VANCOCIN) 750 mg in sodium chloride 0.9 % 150 mL IVPB        750 mg 150 mL/hr over 60 Minutes Intravenous  Once 08/14/11 0837 08/14/11 1439   08/14/11 0200   metroNIDAZOLE (FLAGYL) IVPB 500 mg  Status:  Discontinued        500 mg 100 mL/hr over 60 Minutes Intravenous Every 8 hours 08/13/11 1738 08/16/11 0943   08/13/11 1800   ceFEPIme (MAXIPIME) 1 g in dextrose 5 % 50 mL IVPB        1 g 100 mL/hr over 30 Minutes Intravenous Every 24 hours 08/13/11 1738     08/13/11 1745   vancomycin (VANCOCIN) 500 mg in sodium  chloride 0.9 % 100 mL IVPB        500 mg 100 mL/hr over 60 Minutes Intravenous NOW 08/13/11 1738 08/14/11 1745   08/13/11 1700   vancomycin (VANCOCIN) IVPB 1000 mg/200 mL premix        1,000 mg 200 mL/hr over 60 Minutes Intravenous  Once 08/13/11 1656     08/13/11 1700   ceFEPIme (MAXIPIME) 2 g in dextrose 5 % 50 mL IVPB  Status:  Discontinued        2 g 100 mL/hr over 30 Minutes Intravenous  Once 08/13/11 1656 08/13/11 1741   08/13/11 1700   metroNIDAZOLE (FLAGYL) IVPB 500 mg        500 mg 100 mL/hr over 60 Minutes Intravenous  Once 08/13/11 1656 08/13/11 1839   08/13/11 1545   vancomycin (VANCOCIN) IVPB 1000 mg/200 mL premix        1,000 mg 200 mL/hr over 60 Minutes Intravenous  Once 08/13/11 1536 08/13/11 1700   08/13/11 1530  piperacillin-tazobactam (ZOSYN) IVPB 3.375 g       3.375 g 12.5 mL/hr over 240 Minutes Intravenous  Once 08/13/11 1527 08/13/11 1956          Assessment: Day # 7 Vancomycin and Cefepime in this 61 yo M admitted with acute encephalopathy and hypotension 5/22.  Started on broad spectrum antibiotics for sepsis, possible PNA.  Found to have cdiff.   Patient is afebrile.  WBC increased 32.2 on 5/26.  He has ESRD on a MWF schedule, although has been a little off schedule.  HD done 5/25 and Sun 5/27 x 3.5hrs.  He received Vancomycin on 5/25 and 5/27.   To have perm cath insertion today with HD today.  Cx: 5/22 Cdiff--Pos 5/22 Blood--1/2 gram pos rods 5/24 pleural fluid: NGTD   Abx: 5/22 Vanc >> 5/22 Flagyl (Cdiff, changed to po 5/25) >> 5/22 Cefepime >>  Goal of Therapy:  Pre-HD level 15-67mcg/mL  Plan:  1.  Vancomycin 750 mg IV after each HD . Will f/u if has HD today Tues.  08/19/11.  2.  Continue Cefepime 1 gm IV q 24hr 3.  F/up cultures  Noah Delaine, RPh Clinical Pharmacist 08/19/2011,9:09 AM

## 2011-08-19 NOTE — Progress Notes (Signed)
[name] is a 61 y.o. male admitted on 08/13/11 to the critical care service with septic shock. Septic work up was positive for c.difficile colitis(bllod cultures/pleural fluid culture negative so far.) on day of admission. He also had bibasilar pneumonia. He has continued to have leukocytosis. I have added oral vancomycin for c.diff today. He is on day 8 iv vanc/cefepime/flagyl. He needs better HD access. Appreciate renal/IR/cardiology. He denies any complaints today.(Patient erroneously assigned to [facility] at [facility], he says his PCP is Dr Montez Morita in [place]). I discussed with Dr Nehemiah Settle today.   1. Septic shock   2. Atrial fibrillation with RVR   3. C. difficile colitis   4. ESRD (end stage renal disease)   5. Pleural effusion   6. Pneumonia   7. SBO (small bowel obstruction)   8. Atherosclerosis of native arteries of the extremities with intermittent claudication   9. Atrial flutter   10. CAD (coronary artery disease)   11. End stage renal disease   12. Chronic diastolic CHF (congestive heart failure)   13. Peripheral vascular disease, unspecified     Past Medical History  Diagnosis Date  . Hyperlipidemia   . ESRD (end stage renal disease)     a. MWF dialysis in [place] (followed by Dr. Kristian Covey)  . Arthritis   . Claudication   . Stroke 2005  . Anemia   . Chronic diastolic CHF (congestive heart failure) 07/2010    a. 07/2008 Echo EF 50-55%, mild-mod LVH, Gr 1 DD.  Marland Kitchen Peripheral vascular occlusive disease     a. 07/2011 Periph Angio: No signif Ao-illiac dzs, LCF stenosis, 100% LSFA w recon above knee pop and 3 vessel runoff, 100% RSFA w/ recon above knee --> pending L Fem to below Knee pop bypass.  Marland Kitchen History of tobacco abuse   . Hypertension     takes Amlodipine,Metoprolol,and Prinivil daily  . Dyspnea on exertion     with exertion   Current Facility-Administered Medications  Medication Dose Route Frequency Provider Last Rate Last Dose  . acetaminophen (TYLENOL) tablet 650  mg  650 mg Oral Q6H PRN Katy Apo, MD   650 mg at 08/19/11 0900  . aspirin EC tablet 81 mg  81 mg Oral Daily Jonelle Sidle, MD   81 mg at 08/18/11 1610  . ceFEPIme (MAXIPIME) 1 g in dextrose 5 % 50 mL IVPB  1 g Intravenous Q24H Anh P Pham, PHARMD   1 g at 08/18/11 1800  . darbepoetin (ARANESP) injection 60 mcg  60 mcg Intravenous Q Mon-HD Sadie Haber, MD      . famotidine (PEPCID) tablet 20 mg  20 mg Oral QHS Coralyn Helling, MD   20 mg at 08/17/11 2228  . fentaNYL (SUBLIMAZE) 0.05 MG/ML injection           . ferric gluconate (NULECIT) 125 mg in sodium chloride 0.9 % 100 mL IVPB  125 mg Intravenous Q M,W,F-HD Sadie Haber, MD      . heparin injection 2,000 Units  2,000 Units Dialysis PRN Maree Krabbe, MD      . metoprolol (LOPRESSOR) injection 5 mg  5 mg Intravenous Q3H PRN Zigmund Gottron, MD      . metoprolol tartrate (LOPRESSOR) 25 mg/10 mL oral suspension 12.5 mg  12.5 mg Oral BID Jonelle Sidle, MD   12.5 mg at 08/18/11 9604  . metroNIDAZOLE (FLAGYL) tablet 500 mg  500 mg Oral Q8H Coralyn Helling, MD   500 mg  at 08/18/11 2108  . midazolam (VERSED) 2 MG/2ML injection           . sodium chloride 0.9 % bolus 1,780 mL  25 mL/kg Intravenous Once Bernadene Person, NP      . vancomycin (VANCOCIN) 50 mg/mL oral solution 250 mg  250 mg Oral Q6H Doyle Tegethoff, MD      . vancomycin (VANCOCIN) 750 mg in sodium chloride 0.9 % 150 mL IVPB  750 mg Intravenous Q M,W,F-HD Ival Bible, PHARMD   750 mg at 08/18/11 1443  . vancomycin (VANCOCIN) IVPB 1000 mg/200 mL premix  1,000 mg Intravenous Once Bernadene Person, NP       No Known Allergies Active Problems:  ESRD (end stage renal disease)  Hypertension  End stage renal disease  Peripheral vascular disease, unspecified  Pleural effusion  SBO (small bowel obstruction)  Pneumonia  C. difficile colitis  Atrial fibrillation with RVR  Atrial flutter  CAD (coronary artery disease)   Vital signs in last 24 hours: Temp:   [98 F (36.7 C)-98.9 F (37.2 C)] 98 F (36.7 C) (05/28 1400) Pulse Rate:  [81-92] 90  (05/28 1400) Resp:  [16-18] 18  (05/28 1400) BP: (94-137)/(54-76) 110/60 mmHg (05/28 1400) SpO2:  [94 %-98 %] 98 % (05/28 1400) Weight:  [66 kg (145 lb 8.1 oz)] 66 kg (145 lb 8.1 oz) (05/27 2103) Weight change: 2.1 kg (4 lb 10.1 oz) Last BM Date: 08/18/11  Intake/Output from previous day: 05/27 0701 - 05/28 0700 In: 270 [P.O.:120; IV Piggyback:150] Out: -  Intake/Output this shift: Total I/O In: 30 [P.O.:30] Out: -   Lab Results:  Basename 08/19/11 1200 08/17/11 0844  WBC 23.9* 32.2*  HGB 8.1* 8.6*  HCT 25.5* 27.2*  PLT 237 225   BMET  Basename 08/17/11 0844  NA 141  K 4.0  CL 101  CO2 25  GLUCOSE 118*  BUN 54*  CREATININE 6.77*  CALCIUM 9.3    Studies/Results: Ir Pta Venous Left  08/18/2011  *RADIOLOGY REPORT*  Clinical history:End-stage renal disease and clotted left upper extremity cephalic vein fistula.  PROCEDURE(S): DIALYSIS  FISTULA DECLOT; CEPHALIC VEIN ANGIOPLASTY; ULTRASOUND GUIDANCE FOR VASCULAR ACCESS  Physician: Rachelle Hora. Henn, MD  Medications:Heparin 3000 units, TPA 2 mg  Fluoroscopy time: 22.5 minutes  Contrast: 100 ml Omnipaque 300  Procedure:Informed consent was obtained for a declot procedure. The left upper arm was prepped and draped in a sterile fashion. Maximal barrier sterile technique was utilized including caps, mask, sterile gowns, sterile gloves, sterile drape, hand hygiene and skin antiseptic.  The skin was anesthetized with 1% lidocaine. The fistula was accessed using a 21 gauge needle towards the central venous system with ultrasound guidance.  Ultrasound images were obtained for documentation. Micropuncture catheter was placed. A 6-French vascular sheath was placed.  A Kumpe catheter was advanced in the central veins using a Glidewire.  Central venogram was performed.  The catheter was pulled back as TPA was infused within the cephalic vein thrombus.   Fluoroscopic images were saved for documentation. A Bentson wire was placed centrally and the cephalic vein was treated with the AngioJet thrombectomy device. The cephalic vein was also angioplastied with a 6 mm x 40 mm balloon.  Thrombus was treated again with the AngioJet device and the PTD thrombectomy device.  Using ultrasound guidance, a 21 gauge needle was directed into the vein towards the arterial anastomosis.  Micropuncture dilator set was placed and a 6 [place] vascular sheath was placed. Although  there was already flow from the arterial system, a Fogarty balloon was pulled through the vein in order to dislodge thrombus. There was improved flow within the fistula after using the Fogarty balloon.  The central cephalic vein was angioplastied with 6 mm x 40 mm balloon.  At this point, there was flow throughout the fistula.  However, the flow was only temporary.  The central cephalic vein and mid humeral cephalic vein required angioplasty multiple times in order to keep the cephalic vein patent.   At the end of the procedure, there was a pulse within the cephalic vein but poor outflow.  The vascular sheaths were removed with pursestring sutures.  Findings:The left cephalic vein fistula anastomosis is near the elbow.  The vein was patent near the arterial anastomosis but occluded in the mid and upper arm.  The central cephalic vein was also patent.  A large amount of the thrombus was removed using the AngioJet and mechanical thrombectomy device.  Eventually, there was some flow throughout the fistula after balloon angioplasty of a tight stenosis in the central cephalic vein and recurrent stenosis in the mid humeral cephalic vein. At the end of the procedure, there was still a large amount of thrombus within the aneurysmal segment of the mid humeral cephalic vein.  Partial recanalization of the upper left arm cephalic vein.  Impression:Declot of the left upper extremity fistula.  There is improved flow through  the fistula but the outflow is tenuous and high risk to occlude again.  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  Discussed the declot procedure with Dr. Hyman Hopes.   Recommend trying the fistula for dialysis.  If the fistula occludes again, recommend a catheter and surgical consultation for a new access.  Original Report Authenticated By: Richarda Overlie, M.D.   Ir Av Dialysis Graft Declot  08/18/2011  *RADIOLOGY REPORT*  Clinical history:End-stage renal disease and clotted left upper extremity cephalic vein fistula.  PROCEDURE(S): DIALYSIS  FISTULA DECLOT; CEPHALIC VEIN ANGIOPLASTY; ULTRASOUND GUIDANCE FOR VASCULAR ACCESS  Physician: Rachelle Hora. Henn, MD  Medications:Heparin 3000 units, TPA 2 mg  Fluoroscopy time: 22.5 minutes  Contrast: 100 ml Omnipaque 300  Procedure:Informed consent was obtained for a declot procedure. The left upper arm was prepped and draped in a sterile fashion. Maximal barrier sterile technique was utilized including caps, mask, sterile gowns, sterile gloves, sterile drape, hand hygiene and skin antiseptic.  The skin was anesthetized with 1% lidocaine. The fistula was accessed using a 21 gauge needle towards the central venous system with ultrasound guidance.  Ultrasound images were obtained for documentation. Micropuncture catheter was placed. A 6-French vascular sheath was placed.  A Kumpe catheter was advanced in the central veins using a Glidewire.  Central venogram was performed.  The catheter was pulled back as TPA was infused within the cephalic vein thrombus.  Fluoroscopic images were saved for documentation. A Bentson wire was placed centrally and the cephalic vein was treated with the AngioJet thrombectomy device. The cephalic vein was also angioplastied with a 6 mm x 40 mm balloon.  Thrombus was treated again with the AngioJet device and the PTD thrombectomy device.  Using ultrasound guidance, a 21 gauge needle was directed into the vein towards the arterial  anastomosis.  Micropuncture dilator set was placed and a 6 [place] vascular sheath was placed. Although there was already flow from the arterial system, a Fogarty balloon was pulled through the vein in order to dislodge thrombus. There was improved flow within the  fistula after using the Fogarty balloon.  The central cephalic vein was angioplastied with 6 mm x 40 mm balloon.  At this point, there was flow throughout the fistula.  However, the flow was only temporary.  The central cephalic vein and mid humeral cephalic vein required angioplasty multiple times in order to keep the cephalic vein patent.   At the end of the procedure, there was a pulse within the cephalic vein but poor outflow.  The vascular sheaths were removed with pursestring sutures.  Findings:The left cephalic vein fistula anastomosis is near the elbow.  The vein was patent near the arterial anastomosis but occluded in the mid and upper arm.  The central cephalic vein was also patent.  A large amount of the thrombus was removed using the AngioJet and mechanical thrombectomy device.  Eventually, there was some flow throughout the fistula after balloon angioplasty of a tight stenosis in the central cephalic vein and recurrent stenosis in the mid humeral cephalic vein. At the end of the procedure, there was still a large amount of thrombus within the aneurysmal segment of the mid humeral cephalic vein.  Partial recanalization of the upper left arm cephalic vein.  Impression:Declot of the left upper extremity fistula.  There is improved flow through the fistula but the outflow is tenuous and high risk to occlude again.  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  Discussed the declot procedure with Dr. Hyman Hopes.   Recommend trying the fistula for dialysis.  If the fistula occludes again, recommend a catheter and surgical consultation for a new access.  Original Report Authenticated By: Richarda Overlie, M.D.   Ir Angio Av Shunt Addl  Access  08/18/2011  *RADIOLOGY REPORT*  Clinical history:End-stage renal disease and clotted left upper extremity cephalic vein fistula.  PROCEDURE(S): DIALYSIS  FISTULA DECLOT; CEPHALIC VEIN ANGIOPLASTY; ULTRASOUND GUIDANCE FOR VASCULAR ACCESS  Physician: Rachelle Hora. Henn, MD  Medications:Heparin 3000 units, TPA 2 mg  Fluoroscopy time: 22.5 minutes  Contrast: 100 ml Omnipaque 300  Procedure:Informed consent was obtained for a declot procedure. The left upper arm was prepped and draped in a sterile fashion. Maximal barrier sterile technique was utilized including caps, mask, sterile gowns, sterile gloves, sterile drape, hand hygiene and skin antiseptic.  The skin was anesthetized with 1% lidocaine. The fistula was accessed using a 21 gauge needle towards the central venous system with ultrasound guidance.  Ultrasound images were obtained for documentation. Micropuncture catheter was placed. A 6-French vascular sheath was placed.  A Kumpe catheter was advanced in the central veins using a Glidewire.  Central venogram was performed.  The catheter was pulled back as TPA was infused within the cephalic vein thrombus.  Fluoroscopic images were saved for documentation. A Bentson wire was placed centrally and the cephalic vein was treated with the AngioJet thrombectomy device. The cephalic vein was also angioplastied with a 6 mm x 40 mm balloon.  Thrombus was treated again with the AngioJet device and the PTD thrombectomy device.  Using ultrasound guidance, a 21 gauge needle was directed into the vein towards the arterial anastomosis.  Micropuncture dilator set was placed and a 6 [place] vascular sheath was placed. Although there was already flow from the arterial system, a Fogarty balloon was pulled through the vein in order to dislodge thrombus. There was improved flow within the fistula after using the Fogarty balloon.  The central cephalic vein was angioplastied with 6 mm x 40 mm balloon.  At this point, there was flow  throughout the fistula.  However, the flow was only temporary.  The central cephalic vein and mid humeral cephalic vein required angioplasty multiple times in order to keep the cephalic vein patent.   At the end of the procedure, there was a pulse within the cephalic vein but poor outflow.  The vascular sheaths were removed with pursestring sutures.  Findings:The left cephalic vein fistula anastomosis is near the elbow.  The vein was patent near the arterial anastomosis but occluded in the mid and upper arm.  The central cephalic vein was also patent.  A large amount of the thrombus was removed using the AngioJet and mechanical thrombectomy device.  Eventually, there was some flow throughout the fistula after balloon angioplasty of a tight stenosis in the central cephalic vein and recurrent stenosis in the mid humeral cephalic vein. At the end of the procedure, there was still a large amount of thrombus within the aneurysmal segment of the mid humeral cephalic vein.  Partial recanalization of the upper left arm cephalic vein.  Impression:Declot of the left upper extremity fistula.  There is improved flow through the fistula but the outflow is tenuous and high risk to occlude again.  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  Discussed the declot procedure with Dr. Hyman Hopes.   Recommend trying the fistula for dialysis.  If the fistula occludes again, recommend a catheter and surgical consultation for a new access.  Original Report Authenticated By: Richarda Overlie, M.D.   Ir US Guide Vasc Access Left  08/18/2011  *RADIOLOGY REPORT*  Clinical history:End-stage renal disease and clotted left upper extremity cephalic vein fistula.  PROCEDURE(S): DIALYSIS  FISTULA DECLOT; CEPHALIC VEIN ANGIOPLASTY; ULTRASOUND GUIDANCE FOR VASCULAR ACCESS  Physician: Rachelle Hora. Henn, MD  Medications:Heparin 3000 units, TPA 2 mg  Fluoroscopy time: 22.5 minutes  Contrast: 100 ml Omnipaque 300  Procedure:Informed consent  was obtained for a declot procedure. The left upper arm was prepped and draped in a sterile fashion. Maximal barrier sterile technique was utilized including caps, mask, sterile gowns, sterile gloves, sterile drape, hand hygiene and skin antiseptic.  The skin was anesthetized with 1% lidocaine. The fistula was accessed using a 21 gauge needle towards the central venous system with ultrasound guidance.  Ultrasound images were obtained for documentation. Micropuncture catheter was placed. A 6-French vascular sheath was placed.  A Kumpe catheter was advanced in the central veins using a Glidewire.  Central venogram was performed.  The catheter was pulled back as TPA was infused within the cephalic vein thrombus.  Fluoroscopic images were saved for documentation. A Bentson wire was placed centrally and the cephalic vein was treated with the AngioJet thrombectomy device. The cephalic vein was also angioplastied with a 6 mm x 40 mm balloon.  Thrombus was treated again with the AngioJet device and the PTD thrombectomy device.  Using ultrasound guidance, a 21 gauge needle was directed into the vein towards the arterial anastomosis.  Micropuncture dilator set was placed and a 6 [place] vascular sheath was placed. Although there was already flow from the arterial system, a Fogarty balloon was pulled through the vein in order to dislodge thrombus. There was improved flow within the fistula after using the Fogarty balloon.  The central cephalic vein was angioplastied with 6 mm x 40 mm balloon.  At this point, there was flow throughout the fistula.  However, the flow was only temporary.  The central cephalic vein and mid humeral cephalic vein required angioplasty multiple times in order to  keep the cephalic vein patent.   At the end of the procedure, there was a pulse within the cephalic vein but poor outflow.  The vascular sheaths were removed with pursestring sutures.  Findings:The left cephalic vein fistula anastomosis is near  the elbow.  The vein was patent near the arterial anastomosis but occluded in the mid and upper arm.  The central cephalic vein was also patent.  A large amount of the thrombus was removed using the AngioJet and mechanical thrombectomy device.  Eventually, there was some flow throughout the fistula after balloon angioplasty of a tight stenosis in the central cephalic vein and recurrent stenosis in the mid humeral cephalic vein. At the end of the procedure, there was still a large amount of thrombus within the aneurysmal segment of the mid humeral cephalic vein.  Partial recanalization of the upper left arm cephalic vein.  Impression:Declot of the left upper extremity fistula.  There is improved flow through the fistula but the outflow is tenuous and high risk to occlude again.  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  Discussed the declot procedure with Dr. Hyman Hopes.   Recommend trying the fistula for dialysis.  If the fistula occludes again, recommend a catheter and surgical consultation for a new access.  Original Report Authenticated By: Richarda Overlie, M.D.    Medications: I have reviewed the patient's current medications.   Physical exam GENERAL- alert HEAD- normal atraumatic, no neck masses, normal thyroid, no jvd RESPIRATORY- appears well, vitals normal, no respiratory distress, acyanotic, normal RR, ear and throat exam is normal, neck free of mass or lymphadenopathy, chest clear, no wheezing, crepitations, rhonchi, normal symmetric air entry CVS- regular rate and rhythm, S1, S2 normal, no murmur, click, rub or gallop ABDOMEN- abdomen is soft without significant tenderness, masses, organomegaly or guarding NEURO- Grossly normal EXTREMITIES- extremities normal, atraumatic, no cyanosis or edema  Plan  *  ESRD (end stage renal disease)- HD qMWF per renal. To get temporary cath today( still has infection hence no permanent cath). *  Hypertension- controlled. *  Pleural  effusion- s/p drainage. No evidence of infection. *  SBO (small bowel obstruction)- resolved. * Pneumonia- day 8 abx- ?complete 10 days. * C. difficile colitis- day 8 flgyl, day 1 vanc. ?coplete 2 weeks. Persistent leukocytosis worrisome.  * Atrial fibrillation with RVR/ Atrial flutter/ CAD (coronary artery disease)- rate controlled on lopressor. Not anticoagulated?- has dementia/compliance issues * disposition- eventually home with home health services. * dvt prophylaxis- scds.   Selvin Yun 08/19/2011 4:09 PM Pager: [phone].

## 2011-08-19 NOTE — H&P (Addendum)
[name] is an 61 y.o. male.   Chief Complaint: left arm fistula declot 08/18/11; re clotted again now Scheduled now for [facility] cath placement in IR  HPI: ESRD; CVA; HTN; CHF; PVD  Past Medical History  Diagnosis Date  . Hyperlipidemia   . ESRD (end stage renal disease)     a. MWF dialysis in [place] (followed by Dr. Kristian Covey)  . Arthritis   . Claudication   . Stroke 2005  . Anemia   . Chronic diastolic CHF (congestive heart failure) 07/2010    a. 07/2008 Echo EF 50-55%, mild-mod LVH, Gr 1 DD.  Marland Kitchen Peripheral vascular occlusive disease     a. 07/2011 Periph Angio: No signif Ao-illiac dzs, LCF stenosis, 100% LSFA w recon above knee pop and 3 vessel runoff, 100% RSFA w/ recon above knee --> pending L Fem to below Knee pop bypass.  Marland Kitchen History of tobacco abuse   . Hypertension     takes Amlodipine,Metoprolol,and Prinivil daily  . Dyspnea on exertion     with exertion    Past Surgical History  Procedure Date  . Arteriovenous graft placement   . Av fistula placement 12/10/2000    Right brachiocephalic arteriovenous   . Hernia repair   . Aortagram 07/29/2011    Abdominal Aortagram  . Cardiac catheterization 08/01/11    Left heart catheterization    Family History  Problem Relation Age of Onset  . Breast cancer Mother   . Cancer Father   . Anesthesia problems Neg Hx   . Hypotension Neg Hx   . Malignant hyperthermia Neg Hx   . Pseudochol deficiency Neg Hx    Social History:  reports that he quit smoking about 4 months ago. His smoking use included Cigarettes. He has a 40 pack-year smoking history. He does not have any smokeless tobacco history on file. He reports that he does not drink alcohol or use illicit drugs.  Allergies: No Known Allergies  Medications Prior to Admission  Medication Sig Dispense Refill  . amLODipine (NORVASC) 10 MG tablet Take 10 mg by mouth daily.      Marland Kitchen aspirin EC 81 MG tablet Take 1 tablet (81 mg total) by mouth daily.  90 tablet  3  . calcium  acetate (PHOSLO) 667 MG capsule Take 667-1,334 mg by mouth 3 (three) times daily with meals. 2 caps with each meal, 1 cap with snacks      . cinacalcet (SENSIPAR) 30 MG tablet Take 30 mg by mouth at bedtime.      . gabapentin (NEURONTIN) 100 MG capsule Take 100 mg by mouth 2 (two) times daily.      . isosorbide mononitrate (IMDUR) 30 MG 24 hr tablet Take 30 mg by mouth daily.      Marland Kitchen lanthanum (FOSRENOL) 1000 MG chewable tablet Chew 1,000-2,000 mg by mouth 2 (two) times daily with a meal. 2 tabs with each meal, 1 tab with snacks      . lisinopril (PRINIVIL,ZESTRIL) 30 MG tablet Take 30 mg by mouth daily.      . metoprolol succinate (TOPROL-XL) 100 MG 24 hr tablet Take 100 mg by mouth 2 (two) times daily. Take with or immediately following a meal.      . multivitamin (RENA-VIT) TABS tablet Take 1 tablet by mouth daily.      Marland Kitchen oxyCODONE (OXY IR/ROXICODONE) 5 MG immediate release tablet Take 5-10 mg by mouth every 6 (six) hours as needed. For pain      . sevelamer (  RENVELA) 800 MG tablet Take 800-2,400 mg by mouth 3 (three) times daily with meals. Take 4 tabs with meals and 1 tab with snacks        No results found for this or any previous visit (from the past 48 hour(s)). Ir Pta Venous Left  08/18/2011  *RADIOLOGY REPORT*  Clinical history:End-stage renal disease and clotted left upper extremity cephalic vein fistula.  PROCEDURE(S): DIALYSIS  FISTULA DECLOT; CEPHALIC VEIN ANGIOPLASTY; ULTRASOUND GUIDANCE FOR VASCULAR ACCESS  Physician: Rachelle Hora. Henn, MD  Medications:Heparin 3000 units, TPA 2 mg  Fluoroscopy time: 22.5 minutes  Contrast: 100 ml Omnipaque 300  Procedure:Informed consent was obtained for a declot procedure. The left upper arm was prepped and draped in a sterile fashion. Maximal barrier sterile technique was utilized including caps, mask, sterile gowns, sterile gloves, sterile drape, hand hygiene and skin antiseptic.  The skin was anesthetized with 1% lidocaine. The fistula was accessed using  a 21 gauge needle towards the central venous system with ultrasound guidance.  Ultrasound images were obtained for documentation. Micropuncture catheter was placed. A 6-French vascular sheath was placed.  A Kumpe catheter was advanced in the central veins using a Glidewire.  Central venogram was performed.  The catheter was pulled back as TPA was infused within the cephalic vein thrombus.  Fluoroscopic images were saved for documentation. A Bentson wire was placed centrally and the cephalic vein was treated with the AngioJet thrombectomy device. The cephalic vein was also angioplastied with a 6 mm x 40 mm balloon.  Thrombus was treated again with the AngioJet device and the PTD thrombectomy device.  Using ultrasound guidance, a 21 gauge needle was directed into the vein towards the arterial anastomosis.  Micropuncture dilator set was placed and a 6 [place] vascular sheath was placed. Although there was already flow from the arterial system, a Fogarty balloon was pulled through the vein in order to dislodge thrombus. There was improved flow within the fistula after using the Fogarty balloon.  The central cephalic vein was angioplastied with 6 mm x 40 mm balloon.  At this point, there was flow throughout the fistula.  However, the flow was only temporary.  The central cephalic vein and mid humeral cephalic vein required angioplasty multiple times in order to keep the cephalic vein patent.   At the end of the procedure, there was a pulse within the cephalic vein but poor outflow.  The vascular sheaths were removed with pursestring sutures.  Findings:The left cephalic vein fistula anastomosis is near the elbow.  The vein was patent near the arterial anastomosis but occluded in the mid and upper arm.  The central cephalic vein was also patent.  A large amount of the thrombus was removed using the AngioJet and mechanical thrombectomy device.  Eventually, there was some flow throughout the fistula after balloon angioplasty  of a tight stenosis in the central cephalic vein and recurrent stenosis in the mid humeral cephalic vein. At the end of the procedure, there was still a large amount of thrombus within the aneurysmal segment of the mid humeral cephalic vein.  Partial recanalization of the upper left arm cephalic vein.  Impression:Declot of the left upper extremity fistula.  There is improved flow through the fistula but the outflow is tenuous and high risk to occlude again.  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  Discussed the declot procedure with Dr. Hyman Hopes.   Recommend trying the fistula for dialysis.  If the fistula occludes again, recommend a  catheter and surgical consultation for a new access.  Original Report Authenticated By: Richarda Overlie, M.D.   Ir Av Dialysis Graft Declot  08/18/2011  *RADIOLOGY REPORT*  Clinical history:End-stage renal disease and clotted left upper extremity cephalic vein fistula.  PROCEDURE(S): DIALYSIS  FISTULA DECLOT; CEPHALIC VEIN ANGIOPLASTY; ULTRASOUND GUIDANCE FOR VASCULAR ACCESS  Physician: Rachelle Hora. Henn, MD  Medications:Heparin 3000 units, TPA 2 mg  Fluoroscopy time: 22.5 minutes  Contrast: 100 ml Omnipaque 300  Procedure:Informed consent was obtained for a declot procedure. The left upper arm was prepped and draped in a sterile fashion. Maximal barrier sterile technique was utilized including caps, mask, sterile gowns, sterile gloves, sterile drape, hand hygiene and skin antiseptic.  The skin was anesthetized with 1% lidocaine. The fistula was accessed using a 21 gauge needle towards the central venous system with ultrasound guidance.  Ultrasound images were obtained for documentation. Micropuncture catheter was placed. A 6-French vascular sheath was placed.  A Kumpe catheter was advanced in the central veins using a Glidewire.  Central venogram was performed.  The catheter was pulled back as TPA was infused within the cephalic vein thrombus.  Fluoroscopic images  were saved for documentation. A Bentson wire was placed centrally and the cephalic vein was treated with the AngioJet thrombectomy device. The cephalic vein was also angioplastied with a 6 mm x 40 mm balloon.  Thrombus was treated again with the AngioJet device and the PTD thrombectomy device.  Using ultrasound guidance, a 21 gauge needle was directed into the vein towards the arterial anastomosis.  Micropuncture dilator set was placed and a 6 [place] vascular sheath was placed. Although there was already flow from the arterial system, a Fogarty balloon was pulled through the vein in order to dislodge thrombus. There was improved flow within the fistula after using the Fogarty balloon.  The central cephalic vein was angioplastied with 6 mm x 40 mm balloon.  At this point, there was flow throughout the fistula.  However, the flow was only temporary.  The central cephalic vein and mid humeral cephalic vein required angioplasty multiple times in order to keep the cephalic vein patent.   At the end of the procedure, there was a pulse within the cephalic vein but poor outflow.  The vascular sheaths were removed with pursestring sutures.  Findings:The left cephalic vein fistula anastomosis is near the elbow.  The vein was patent near the arterial anastomosis but occluded in the mid and upper arm.  The central cephalic vein was also patent.  A large amount of the thrombus was removed using the AngioJet and mechanical thrombectomy device.  Eventually, there was some flow throughout the fistula after balloon angioplasty of a tight stenosis in the central cephalic vein and recurrent stenosis in the mid humeral cephalic vein. At the end of the procedure, there was still a large amount of thrombus within the aneurysmal segment of the mid humeral cephalic vein.  Partial recanalization of the upper left arm cephalic vein.  Impression:Declot of the left upper extremity fistula.  There is improved flow through the fistula but the  outflow is tenuous and high risk to occlude again.  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  Discussed the declot procedure with Dr. Hyman Hopes.   Recommend trying the fistula for dialysis.  If the fistula occludes again, recommend a catheter and surgical consultation for a new access.  Original Report Authenticated By: Richarda Overlie, M.D.   Ir Angio Av Shunt Addl Access  08/18/2011  *  RADIOLOGY REPORT*  Clinical history:End-stage renal disease and clotted left upper extremity cephalic vein fistula.  PROCEDURE(S): DIALYSIS  FISTULA DECLOT; CEPHALIC VEIN ANGIOPLASTY; ULTRASOUND GUIDANCE FOR VASCULAR ACCESS  Physician: Rachelle Hora. Henn, MD  Medications:Heparin 3000 units, TPA 2 mg  Fluoroscopy time: 22.5 minutes  Contrast: 100 ml Omnipaque 300  Procedure:Informed consent was obtained for a declot procedure. The left upper arm was prepped and draped in a sterile fashion. Maximal barrier sterile technique was utilized including caps, mask, sterile gowns, sterile gloves, sterile drape, hand hygiene and skin antiseptic.  The skin was anesthetized with 1% lidocaine. The fistula was accessed using a 21 gauge needle towards the central venous system with ultrasound guidance.  Ultrasound images were obtained for documentation. Micropuncture catheter was placed. A 6-French vascular sheath was placed.  A Kumpe catheter was advanced in the central veins using a Glidewire.  Central venogram was performed.  The catheter was pulled back as TPA was infused within the cephalic vein thrombus.  Fluoroscopic images were saved for documentation. A Bentson wire was placed centrally and the cephalic vein was treated with the AngioJet thrombectomy device. The cephalic vein was also angioplastied with a 6 mm x 40 mm balloon.  Thrombus was treated again with the AngioJet device and the PTD thrombectomy device.  Using ultrasound guidance, a 21 gauge needle was directed into the vein towards the arterial anastomosis.   Micropuncture dilator set was placed and a 6 [place] vascular sheath was placed. Although there was already flow from the arterial system, a Fogarty balloon was pulled through the vein in order to dislodge thrombus. There was improved flow within the fistula after using the Fogarty balloon.  The central cephalic vein was angioplastied with 6 mm x 40 mm balloon.  At this point, there was flow throughout the fistula.  However, the flow was only temporary.  The central cephalic vein and mid humeral cephalic vein required angioplasty multiple times in order to keep the cephalic vein patent.   At the end of the procedure, there was a pulse within the cephalic vein but poor outflow.  The vascular sheaths were removed with pursestring sutures.  Findings:The left cephalic vein fistula anastomosis is near the elbow.  The vein was patent near the arterial anastomosis but occluded in the mid and upper arm.  The central cephalic vein was also patent.  A large amount of the thrombus was removed using the AngioJet and mechanical thrombectomy device.  Eventually, there was some flow throughout the fistula after balloon angioplasty of a tight stenosis in the central cephalic vein and recurrent stenosis in the mid humeral cephalic vein. At the end of the procedure, there was still a large amount of thrombus within the aneurysmal segment of the mid humeral cephalic vein.  Partial recanalization of the upper left arm cephalic vein.  Impression:Declot of the left upper extremity fistula.  There is improved flow through the fistula but the outflow is tenuous and high risk to occlude again.  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  Discussed the declot procedure with Dr. Hyman Hopes.   Recommend trying the fistula for dialysis.  If the fistula occludes again, recommend a catheter and surgical consultation for a new access.  Original Report Authenticated By: Richarda Overlie, M.D.   Ir US Guide Vasc Access  Left  08/18/2011  *RADIOLOGY REPORT*  Clinical history:End-stage renal disease and clotted left upper extremity cephalic vein fistula.  PROCEDURE(S): DIALYSIS  FISTULA DECLOT; CEPHALIC VEIN ANGIOPLASTY; ULTRASOUND GUIDANCE  FOR VASCULAR ACCESS  Physician: Rachelle Hora. Henn, MD  Medications:Heparin 3000 units, TPA 2 mg  Fluoroscopy time: 22.5 minutes  Contrast: 100 ml Omnipaque 300  Procedure:Informed consent was obtained for a declot procedure. The left upper arm was prepped and draped in a sterile fashion. Maximal barrier sterile technique was utilized including caps, mask, sterile gowns, sterile gloves, sterile drape, hand hygiene and skin antiseptic.  The skin was anesthetized with 1% lidocaine. The fistula was accessed using a 21 gauge needle towards the central venous system with ultrasound guidance.  Ultrasound images were obtained for documentation. Micropuncture catheter was placed. A 6-French vascular sheath was placed.  A Kumpe catheter was advanced in the central veins using a Glidewire.  Central venogram was performed.  The catheter was pulled back as TPA was infused within the cephalic vein thrombus.  Fluoroscopic images were saved for documentation. A Bentson wire was placed centrally and the cephalic vein was treated with the AngioJet thrombectomy device. The cephalic vein was also angioplastied with a 6 mm x 40 mm balloon.  Thrombus was treated again with the AngioJet device and the PTD thrombectomy device.  Using ultrasound guidance, a 21 gauge needle was directed into the vein towards the arterial anastomosis.  Micropuncture dilator set was placed and a 6 [place] vascular sheath was placed. Although there was already flow from the arterial system, a Fogarty balloon was pulled through the vein in order to dislodge thrombus. There was improved flow within the fistula after using the Fogarty balloon.  The central cephalic vein was angioplastied with 6 mm x 40 mm balloon.  At this point, there was flow  throughout the fistula.  However, the flow was only temporary.  The central cephalic vein and mid humeral cephalic vein required angioplasty multiple times in order to keep the cephalic vein patent.   At the end of the procedure, there was a pulse within the cephalic vein but poor outflow.  The vascular sheaths were removed with pursestring sutures.  Findings:The left cephalic vein fistula anastomosis is near the elbow.  The vein was patent near the arterial anastomosis but occluded in the mid and upper arm.  The central cephalic vein was also patent.  A large amount of the thrombus was removed using the AngioJet and mechanical thrombectomy device.  Eventually, there was some flow throughout the fistula after balloon angioplasty of a tight stenosis in the central cephalic vein and recurrent stenosis in the mid humeral cephalic vein. At the end of the procedure, there was still a large amount of thrombus within the aneurysmal segment of the mid humeral cephalic vein.  Partial recanalization of the upper left arm cephalic vein.  Impression:Declot of the left upper extremity fistula.  There is improved flow through the fistula but the outflow is tenuous and high risk to occlude again.  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  Discussed the declot procedure with Dr. Hyman Hopes.   Recommend trying the fistula for dialysis.  If the fistula occludes again, recommend a catheter and surgical consultation for a new access.  Original Report Authenticated By: Richarda Overlie, M.D.    Review of Systems  Constitutional: Negative for fever and chills.  Respiratory: Negative for cough.   Cardiovascular: Negative for chest pain.  Gastrointestinal: Negative for nausea and vomiting.  Neurological: Negative for dizziness and headaches.    Blood pressure 130/72, pulse 89, temperature 98.6 F (37 C), temperature source Oral, resp. rate 18, height 5\' 6"  (1.676 m), weight  145 lb 8.1 oz (66 kg), SpO2  96.00%. Physical Exam  Constitutional: He is oriented to person, place, and time. He appears well-developed and well-nourished.  Cardiovascular: Normal rate, regular rhythm and normal heart sounds.   Respiratory: Effort normal and breath sounds normal.  GI: There is no tenderness.  Musculoskeletal: Normal range of motion.  Neurological: He is alert and oriented to person, place, and time.  Skin: Skin is warm.  Psychiatric: He has a normal mood and affect. His behavior is normal.       Pt able to consent today; alert and oriented;  Has had slight confusion yesterday     Assessment/Plan Left arm fistula declot 08/18/11; successful. Clotted again today; need PC per Dr Lowella Dandy note Pt scheduled for [facility] placement for dialysis today. WBC 32.2 5/26; afeb await cbc result now If wbc still high we will not be able to perform tunnelled cath; maybe temp cath.... I will notify Dr Hyman Hopes if necessary.  TURPIN,PAMELA A 08/19/2011, 11:34 AM   Once in IR, pt refused catheter placement. He says he does not want hemodialysis. He is familiar with the catheter as he has had one in the past. He seems resolute, somewhat taciturn, not answering all questions. We will postpone placement until pt is agreeable. Called to Dr. Hyman Hopes.

## 2011-08-19 NOTE — Progress Notes (Signed)
Physical Therapy Treatment Patient Details Name: [name] MRN: [id number] DOB: 1951/03/16 Today's Date: 08/19/2011 Time: 4098-1191 PT Time Calculation (min): 24 min  PT Assessment / Plan / Recommendation Comments on Treatment Session  Pt's mentation continues to be a problem for initiation and follow through of tasks.  Gait can be used as a challenge for balance, but pt unable to follow direction enough to complete the BERG or DGI yet.    Follow Up Recommendations  Home health PT;Supervision/Assistance - 24 hour    Barriers to Discharge        Equipment Recommendations  Other (comment) (Still TBD)    Recommendations for Other Services    Frequency Min 3X/week   Plan Discharge plan remains appropriate    Precautions / Restrictions Precautions Precautions: Fall Restrictions Weight Bearing Restrictions: No   Pertinent Vitals/Pain Unable to get SaO2 reading on multiple attempts     Mobility  Bed Mobility Bed Mobility: Right Sidelying to Sit;Sitting - Scoot to Edge of Bed Right Sidelying to Sit: 4: Min assist Sitting - Scoot to [place] of Bed: 4: Min guard Details for Bed Mobility Assistance: vc/tc's to help pt initiate/ hand placement;  assist to help pt follow through Transfers Transfers: Sit to Stand;Stand to Sit Sit to Stand: 4: Min guard;From bed Stand to Sit: 4: Min guard;To chair/3-in-1;To bed Details for Transfer Assistance: close supervision due to pt not initiating movement and mildly unsteady with transition to sit Ambulation/Gait Ambulation/Gait Assistance: 4: Min guard Ambulation Distance (Feet): 180 Feet (with 3 standin grest breaks) Assistive device: Other (Comment) (rail on R or infrequent balance checks) Ambulation/Gait Assistance Details: unsteady gait; short step length consistently looking down unless cued to look up and or scan his envirionment.  Consistently  reaches for the rail unless dued to try not and then is more guarded.  Scanning makes him  mildly more unsteady and he does not want to scan for long. Gait Pattern: Step-through pattern;Decreased step length - right;Decreased step length - left;Decreased stride length;Wide base of support Stairs: No Wheelchair Mobility Wheelchair Mobility: No    Exercises     PT Diagnosis:    PT Problem List:   PT Treatment Interventions:     PT Goals Acute Rehab PT Goals Time For Goal Achievement: 08/25/11 Potential to Achieve Goals: Good PT Goal: Supine/Side to Sit - Progress: Progressing toward goal PT Goal: Sit to Stand - Progress: Progressing toward goal PT Goal: Ambulate - Progress: Progressing toward goal Additional Goals PT Goal: Additional Goal #1 - Progress: Not met  Visit Information  Last PT Received On: 08/19/11 Assistance Needed: +1    Subjective Data  Subjective: Pt just kept saying "shew...shew" as if fatigued Patient Stated Goal: To return home with wife   Cognition  Overall Cognitive Status: Impaired Area of Impairment: Memory;Awareness of errors Arousal/Alertness: Awake/alert Orientation Level: Disoriented to;Situation Behavior During Session: Flat affect    Balance  Dynamic Sitting Balance Dynamic Sitting - Balance Support: No upper extremity supported;Feet supported Dynamic Sitting - Level of Assistance: 5: Stand by assistance  End of Session PT - End of Session Activity Tolerance: Patient tolerated treatment well Patient left: in chair;with call bell/phone within reach Nurse Communication: Mobility status    Mammie Meras, Eliseo Gum 08/19/2011, 10:23 AM  08/19/2011  [place], PT [phone] (pager)

## 2011-08-19 NOTE — Progress Notes (Signed)
Occupational Therapy Treatment Patient Details Name: [name] MRN: [id number] DOB: 1950/10/25 Today's Date: 08/19/2011 Time: 7829-5621 OT Time Calculation (min): 10 min  OT Assessment / Plan / Recommendation Comments on Treatment Session Pt. limited today by dizziness after bending forward to don bil socks and returning to upright sitting position. BP taken of 99/50 and HR 81. RN notified of pt. return to supine per pt. request.    Follow Up Recommendations  Home health OT;Supervision/Assistance - 24 hour       Equipment Recommendations  None recommended by OT       Frequency Min 2X/week   Plan Discharge plan remains appropriate    Precautions / Restrictions Precautions Precautions: Fall Precaution Comments: ?orthostatic Restrictions Weight Bearing Restrictions: No       ADL  Lower Body Dressing: Performed;Set up;Supervision/safety Where Assessed - Lower Body Dressing: Unsupported sit to stand ADL Comments: Pt. presented sitting EOB and completed donning of bil socks by crossing both feet over opposite knees. Much faster in completing task than yesterday and pt.'s mentation improved today. Pt. still benefiting from increased time to complete task. Pt. reported dizziness after bending forward to complete donning of socks and BP taken of 99/50 sitting EOB. Pt. asked to return to supine position due to dizziness. RN notified.      OT Goals Acute Rehab OT Goals OT Goal Formulation: With patient Time For Goal Achievement: 08/25/11 Potential to Achieve Goals: Good ADL Goals Pt Will Perform Grooming: with set-up;with supervision;Standing at sink ADL Goal: Grooming - Progress: Progressing toward goals Pt Will Perform Lower Body Dressing: with set-up;with supervision;Sit to stand from bed ADL Goal: Lower Body Dressing - Progress: Progressing toward goals  Visit Information  Last OT Received On: 08/19/11 Assistance Needed: +1          Cognition  Overall Cognitive  Status: Impaired Area of Impairment: Memory;Awareness of errors Arousal/Alertness: Awake/alert Orientation Level: Disoriented to;Situation Behavior During Session: Flat affect Awareness of Errors: Assistance required to identify errors made;Assistance required to correct errors made    Mobility Bed Mobility Bed Mobility: Sit to Supine Sit to Supine: 6: Modified independent (Device/Increase time);With rail Transfers Transfers: Not assessed         End of Session OT - End of Session Activity Tolerance: Treatment limited secondary to medical complications (Comment) (dizziness) Patient left: in bed;with call bell/phone within reach Nurse Communication: Other (comment) (dizziness sitting EOB)   Jodye Scali, OTR/L Pager [phone] 08/19/2011, 2:30 PM

## 2011-08-19 NOTE — Progress Notes (Signed)
Pt c/o pain to graft site, soreness. Has no orders for pain meds. Dr Nehemiah Settle saw pt yesterday, he was notified. Dr Nehemiah Settle not primary MD, he states hospitalist MD will be taking over as primary, did receive order for tylenol.

## 2011-08-20 ENCOUNTER — Inpatient Hospital Stay (HOSPITAL_COMMUNITY): Payer: Medicare Other

## 2011-08-20 DIAGNOSIS — D7289 Other specified disorders of white blood cells: Secondary | ICD-10-CM

## 2011-08-20 DIAGNOSIS — A0472 Enterocolitis due to Clostridium difficile, not specified as recurrent: Secondary | ICD-10-CM

## 2011-08-20 DIAGNOSIS — N186 End stage renal disease: Secondary | ICD-10-CM

## 2011-08-20 DIAGNOSIS — F0390 Unspecified dementia without behavioral disturbance: Secondary | ICD-10-CM

## 2011-08-20 LAB — CULTURE, BLOOD (ROUTINE X 2): Culture  Setup Time: 201305230215

## 2011-08-20 MED ORDER — VANCOMYCIN HCL 1000 MG IV SOLR: 750.0000 mg | INTRAVENOUS | Status: DC

## 2011-08-20 MED ORDER — METOPROLOL TARTRATE 12.5 MG HALF TABLET
12.5000 mg | ORAL_TABLET | Freq: Two times a day (BID) | ORAL | Status: DC
  Administered 2011-08-21 – 2011-08-22 (×3): 12.5 mg via ORAL
  Filled 2011-08-20 (×9): qty 1

## 2011-08-20 NOTE — Progress Notes (Signed)
ANTIBIOTIC CONSULT NOTE - FOLLOW UP  Pharmacy Consult for Vancomycin, Cefepime Indication: sepsis/?PNA  No Known Allergies  Patient Measurements: Height: 5\' 6"  (167.6 cm) Weight: 145 lb 8.1 oz (66 kg) IBW/kg (Calculated) : 63.8   Vital Signs: Temp: 98.3 F (36.8 C) (05/29 1000) Temp src: Oral (05/29 1000) BP: 142/68 mmHg (05/29 1000) Pulse Rate: 86  (05/29 1000) Intake/Output from previous day: 05/28 0701 - 05/29 0700 In: 1470 [P.O.:1470] Out: -  Intake/Output from this shift:    Labs:  Basename 08/19/11 1200  WBC 23.9*  HGB 8.1*  PLT 237  [facility]CREA --  CREATININE --   Estimated Creatinine Clearance: 10.2 ml/min (by C-G formula based on Cr of 6.77).  Assessment: Day # 8 Vancomycin and Cefepime in this 61 yo M for PNA/ sepsis. . Patient is afebrile.  WBC 23.9 on 5/28.  Nurse reports patient refused temporary catheter placement yesterday and refusing hemodialysis.   He last received Vancomycin on 5/27 and hemodialysis not done that day, thus he is still covered by that dose. If not hemodialyzed he will not need another Vancomycin dose for another 5-7days.  MD noted possible plan of 10 days Vanc/Cefepime thus should not need another vancomycin dose unless HD resumed.  Cx: 5/22 Cdiff--Pos 5/22 Blood--1/2 gram pos rods (Diphtheroids) 5/24 pleural fluid: Neg/final   Goal of Therapy:  Pre-HD level 15-70mcg/mL Or random Vanc level (trough)15-20 prior to next Vanc dose if no HD.  Plan:  1.  Hold Vancomycin dose for now. Will follow up plans for HD.  If continues Vanc beyond 10 days and no HD we will plan to check random Vanc level 6/1 or 6/2. 2.  Continue Cefepime 1 gm IV q 24hr 3.  F/up cultures  [name], RPh Clinical Pharmacist 08/20/2011,10:54 AM

## 2011-08-20 NOTE — Progress Notes (Signed)
08-20-11 1401 Earl Oelkers Graves-Bigelow, RN,BSN [phone] Pt was discussed in the long length of stay meeting today.   

## 2011-08-20 NOTE — Progress Notes (Signed)
[facility] ROUNDING NOTE   Subjective:   Interval History: none.  Objective:  Vital signs in last 24 hours:  Temp:  [97.8 F (36.6 C)-98.5 F (36.9 C)] 98.3 F (36.8 C) (05/29 1000) Pulse Rate:  [77-90] 86  (05/29 1000) Resp:  [18] 18  (05/29 1000) BP: (100-142)/(54-68) 142/68 mmHg (05/29 1000) SpO2:  [95 %-98 %] 96 % (05/29 1000) Weight:  [66 kg (145 lb 8.1 oz)] 66 kg (145 lb 8.1 oz) (05/28 2023)  Weight change: -1.6 kg (-3 lb 8.4 oz) Filed Weights   08/18/11 1541 08/18/11 2103 08/19/11 2023  Weight: 67.6 kg (149 lb 0.5 oz) 66 kg (145 lb 8.1 oz) 66 kg (145 lb 8.1 oz)    Intake/Output: I/O last 3 completed shifts: In: 1470 [P.O.:1470] Out: -    Intake/Output this shift:     CVS- RRR RS- CTA ABD- BS present soft non-distended EXT- no edema   Basic Metabolic Panel:  Lab 08/17/11 4540 08/16/11 1358 08/16/11 0430 08/15/11 0430 08/14/11 0435  NA 141 141 143 142 141  K 4.0 4.1 4.1 4.2 4.8  CL 101 100 102 100 96  CO2 25 23 22 25 25   GLUCOSE 118* 133* 141* 97 92  BUN 54* 71* 69* 56* 89*  CREATININE 6.77* 7.77* 7.27* 5.96* 8.77*  CALCIUM 9.3 9.2 9.2 -- --  MG -- -- 2.0 -- 1.8  PHOS 4.4 5.4* 5.8* 6.1* 7.8*    Liver Function Tests:  Lab 08/17/11 0844 08/16/11 1358 08/16/11 0430 08/15/11 0430 08/13/11 1530  AST -- -- 19 -- 39*  ALT -- -- 10 -- 12  ALKPHOS -- -- 96 -- 64  BILITOT -- -- 0.3 -- 0.5  PROT -- -- 5.0* -- 5.9*  ALBUMIN 2.2* 2.0* 1.9* 2.0* 2.2*    Lab 08/13/11 1530  LIPASE 5*  AMYLASE --   No results found for this basename: AMMONIA:3 in the last 168 hours  CBC:  Lab 08/19/11 1200 08/17/11 0844 08/16/11 1358 08/16/11 0430 08/15/11 0430 08/13/11 1427  WBC 23.9* 32.2* 25.6* 21.8* 14.2* --  NEUTROABS -- -- -- -- -- 10.4*  HGB 8.1* 8.6* 9.3* 8.6* 8.3* --  HCT 25.5* 27.2* 28.8* 27.2* 25.8* --  MCV 88.2 87.5 87.8 87.2 86.0 --  PLT 237 225 197 247 270 --    Cardiac Enzymes:  Lab 08/14/11 1505 08/14/11 0435 08/13/11 2315    CKTOTAL 254* 410* 614*  CKMB 4.4* 5.7* 5.7*  CKMBINDEX -- -- --  TROPONINI <0.30 <0.30 <0.30    BNP: No components found with this basename: POCBNP:5  CBG:  Lab 08/16/11 1742 08/16/11 1151 08/16/11 0722 08/16/11 0510 08/16/11 0350  GLUCAP 77 100* 117* 102* 68*    Microbiology: Results for orders placed during the hospital encounter of 08/13/11  CULTURE, BLOOD (ROUTINE X 2)     Status: Normal   Collection Time   08/13/11  3:15 PM      Component Value Range Status Comment   Specimen Description BLOOD CENTRAL LINE   Final    Special Requests BOTTLES DRAWN AEROBIC AND ANAEROBIC   Final    Culture  Setup Time [id number]   Final    Culture     Final    Value: DIPHTHEROIDS(CORYNEBACTERIUM SPECIES)     Note: Standardized susceptibility testing for this organism is not available.     Note: Gram Stain Report Called to,Read Back By and Verified With: GENIVEVE WROBLEWSKI @1150  08/15/11 BY KRAWS   Report Status 08/16/2011 FINAL  Final   CULTURE, BLOOD (ROUTINE X 2)     Status: Normal   Collection Time   08/13/11  3:50 PM      Component Value Range Status Comment   Specimen Description BLOOD HAND RIGHT   Final    Special Requests BOTTLES DRAWN AEROBIC ONLY 5CC   Final    Culture  Setup Time [id number]   Final    Culture NO GROWTH 5 DAYS   Final    Report Status 08/20/2011 FINAL   Final   MRSA PCR SCREENING     Status: Normal   Collection Time   08/13/11  6:50 PM      Component Value Range Status Comment   MRSA by PCR NEGATIVE  NEGATIVE  Final   CLOSTRIDIUM DIFFICILE BY PCR     Status: Abnormal   Collection Time   08/13/11 10:46 PM      Component Value Range Status Comment   C difficile by pcr POSITIVE (*) NEGATIVE  Final   BODY FLUID CULTURE     Status: Normal   Collection Time   08/15/11  5:58 PM      Component Value Range Status Comment   Specimen Description PLEURAL FLUID RIGHT   Final    Special Requests NONE   Final    Gram Stain     Final    Value: RARE WBC  PRESENT,BOTH PMN AND MONONUCLEAR     NO ORGANISMS SEEN   Culture NO GROWTH 3 DAYS   Final    Report Status 08/19/2011 FINAL   Final     Coagulation Studies:  Basename 08/19/11 1200  LABPROT 18.8*  INR 1.54*    Urinalysis: No results found for this basename: COLORURINE:2,APPERANCEUR:2,LABSPEC:2,PHURINE:2,GLUCOSEU:2,HGBUR:2,BILIRUBINUR:2,KETONESUR:2,PROTEINUR:2,UROBILINOGEN:2,NITRITE:2,LEUKOCYTESUR:2 in the last 72 hours    Imaging: Ir Pta Venous Left  08/18/2011  *RADIOLOGY REPORT*  Clinical history:End-stage renal disease and clotted left upper extremity cephalic vein fistula.  PROCEDURE(S): DIALYSIS  FISTULA DECLOT; CEPHALIC VEIN ANGIOPLASTY; ULTRASOUND GUIDANCE FOR VASCULAR ACCESS  Physician: Rachelle Hora. Henn, MD  Medications:Heparin 3000 units, TPA 2 mg  Fluoroscopy time: 22.5 minutes  Contrast: 100 ml Omnipaque 300  Procedure:Informed consent was obtained for a declot procedure. The left upper arm was prepped and draped in a sterile fashion. Maximal barrier sterile technique was utilized including caps, mask, sterile gowns, sterile gloves, sterile drape, hand hygiene and skin antiseptic.  The skin was anesthetized with 1% lidocaine. The fistula was accessed using a 21 gauge needle towards the central venous system with ultrasound guidance.  Ultrasound images were obtained for documentation. Micropuncture catheter was placed. A 6-French vascular sheath was placed.  A Kumpe catheter was advanced in the central veins using a Glidewire.  Central venogram was performed.  The catheter was pulled back as TPA was infused within the cephalic vein thrombus.  Fluoroscopic images were saved for documentation. A Bentson wire was placed centrally and the cephalic vein was treated with the AngioJet thrombectomy device. The cephalic vein was also angioplastied with a 6 mm x 40 mm balloon.  Thrombus was treated again with the AngioJet device and the PTD thrombectomy device.  Using ultrasound guidance, a 21 gauge  needle was directed into the vein towards the arterial anastomosis.  Micropuncture dilator set was placed and a 6 [place] vascular sheath was placed. Although there was already flow from the arterial system, a Fogarty balloon was pulled through the vein in order to dislodge thrombus. There was improved flow within the fistula after using the Fogarty balloon.  The central cephalic vein was angioplastied with 6 mm x 40 mm balloon.  At this point, there was flow throughout the fistula.  However, the flow was only temporary.  The central cephalic vein and mid humeral cephalic vein required angioplasty multiple times in order to keep the cephalic vein patent.   At the end of the procedure, there was a pulse within the cephalic vein but poor outflow.  The vascular sheaths were removed with pursestring sutures.  Findings:The left cephalic vein fistula anastomosis is near the elbow.  The vein was patent near the arterial anastomosis but occluded in the mid and upper arm.  The central cephalic vein was also patent.  A large amount of the thrombus was removed using the AngioJet and mechanical thrombectomy device.  Eventually, there was some flow throughout the fistula after balloon angioplasty of a tight stenosis in the central cephalic vein and recurrent stenosis in the mid humeral cephalic vein. At the end of the procedure, there was still a large amount of thrombus within the aneurysmal segment of the mid humeral cephalic vein.  Partial recanalization of the upper left arm cephalic vein.  Impression:Declot of the left upper extremity fistula.  There is improved flow through the fistula but the outflow is tenuous and high risk to occlude again.  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  Discussed the declot procedure with Dr. Hyman Hopes.   Recommend trying the fistula for dialysis.  If the fistula occludes again, recommend a catheter and surgical consultation for a new access.  Original Report  Authenticated By: Richarda Overlie, M.D.   Ir Av Dialysis Graft Declot  08/18/2011  *RADIOLOGY REPORT*  Clinical history:End-stage renal disease and clotted left upper extremity cephalic vein fistula.  PROCEDURE(S): DIALYSIS  FISTULA DECLOT; CEPHALIC VEIN ANGIOPLASTY; ULTRASOUND GUIDANCE FOR VASCULAR ACCESS  Physician: Rachelle Hora. Henn, MD  Medications:Heparin 3000 units, TPA 2 mg  Fluoroscopy time: 22.5 minutes  Contrast: 100 ml Omnipaque 300  Procedure:Informed consent was obtained for a declot procedure. The left upper arm was prepped and draped in a sterile fashion. Maximal barrier sterile technique was utilized including caps, mask, sterile gowns, sterile gloves, sterile drape, hand hygiene and skin antiseptic.  The skin was anesthetized with 1% lidocaine. The fistula was accessed using a 21 gauge needle towards the central venous system with ultrasound guidance.  Ultrasound images were obtained for documentation. Micropuncture catheter was placed. A 6-French vascular sheath was placed.  A Kumpe catheter was advanced in the central veins using a Glidewire.  Central venogram was performed.  The catheter was pulled back as TPA was infused within the cephalic vein thrombus.  Fluoroscopic images were saved for documentation. A Bentson wire was placed centrally and the cephalic vein was treated with the AngioJet thrombectomy device. The cephalic vein was also angioplastied with a 6 mm x 40 mm balloon.  Thrombus was treated again with the AngioJet device and the PTD thrombectomy device.  Using ultrasound guidance, a 21 gauge needle was directed into the vein towards the arterial anastomosis.  Micropuncture dilator set was placed and a 6 [place] vascular sheath was placed. Although there was already flow from the arterial system, a Fogarty balloon was pulled through the vein in order to dislodge thrombus. There was improved flow within the fistula after using the Fogarty balloon.  The central cephalic vein was angioplastied  with 6 mm x 40 mm balloon.  At this point, there was flow throughout the fistula.  However, the flow  was only temporary.  The central cephalic vein and mid humeral cephalic vein required angioplasty multiple times in order to keep the cephalic vein patent.   At the end of the procedure, there was a pulse within the cephalic vein but poor outflow.  The vascular sheaths were removed with pursestring sutures.  Findings:The left cephalic vein fistula anastomosis is near the elbow.  The vein was patent near the arterial anastomosis but occluded in the mid and upper arm.  The central cephalic vein was also patent.  A large amount of the thrombus was removed using the AngioJet and mechanical thrombectomy device.  Eventually, there was some flow throughout the fistula after balloon angioplasty of a tight stenosis in the central cephalic vein and recurrent stenosis in the mid humeral cephalic vein. At the end of the procedure, there was still a large amount of thrombus within the aneurysmal segment of the mid humeral cephalic vein.  Partial recanalization of the upper left arm cephalic vein.  Impression:Declot of the left upper extremity fistula.  There is improved flow through the fistula but the outflow is tenuous and high risk to occlude again.  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  Discussed the declot procedure with Dr. Hyman Hopes.   Recommend trying the fistula for dialysis.  If the fistula occludes again, recommend a catheter and surgical consultation for a new access.  Original Report Authenticated By: Richarda Overlie, M.D.   Ir Angio Av Shunt Addl Access  08/18/2011  *RADIOLOGY REPORT*  Clinical history:End-stage renal disease and clotted left upper extremity cephalic vein fistula.  PROCEDURE(S): DIALYSIS  FISTULA DECLOT; CEPHALIC VEIN ANGIOPLASTY; ULTRASOUND GUIDANCE FOR VASCULAR ACCESS  Physician: Rachelle Hora. Henn, MD  Medications:Heparin 3000 units, TPA 2 mg  Fluoroscopy time: 22.5 minutes   Contrast: 100 ml Omnipaque 300  Procedure:Informed consent was obtained for a declot procedure. The left upper arm was prepped and draped in a sterile fashion. Maximal barrier sterile technique was utilized including caps, mask, sterile gowns, sterile gloves, sterile drape, hand hygiene and skin antiseptic.  The skin was anesthetized with 1% lidocaine. The fistula was accessed using a 21 gauge needle towards the central venous system with ultrasound guidance.  Ultrasound images were obtained for documentation. Micropuncture catheter was placed. A 6-French vascular sheath was placed.  A Kumpe catheter was advanced in the central veins using a Glidewire.  Central venogram was performed.  The catheter was pulled back as TPA was infused within the cephalic vein thrombus.  Fluoroscopic images were saved for documentation. A Bentson wire was placed centrally and the cephalic vein was treated with the AngioJet thrombectomy device. The cephalic vein was also angioplastied with a 6 mm x 40 mm balloon.  Thrombus was treated again with the AngioJet device and the PTD thrombectomy device.  Using ultrasound guidance, a 21 gauge needle was directed into the vein towards the arterial anastomosis.  Micropuncture dilator set was placed and a 6 [place] vascular sheath was placed. Although there was already flow from the arterial system, a Fogarty balloon was pulled through the vein in order to dislodge thrombus. There was improved flow within the fistula after using the Fogarty balloon.  The central cephalic vein was angioplastied with 6 mm x 40 mm balloon.  At this point, there was flow throughout the fistula.  However, the flow was only temporary.  The central cephalic vein and mid humeral cephalic vein required angioplasty multiple times in order to keep the cephalic vein patent.  At the end of the procedure, there was a pulse within the cephalic vein but poor outflow.  The vascular sheaths were removed with pursestring sutures.   Findings:The left cephalic vein fistula anastomosis is near the elbow.  The vein was patent near the arterial anastomosis but occluded in the mid and upper arm.  The central cephalic vein was also patent.  A large amount of the thrombus was removed using the AngioJet and mechanical thrombectomy device.  Eventually, there was some flow throughout the fistula after balloon angioplasty of a tight stenosis in the central cephalic vein and recurrent stenosis in the mid humeral cephalic vein. At the end of the procedure, there was still a large amount of thrombus within the aneurysmal segment of the mid humeral cephalic vein.  Partial recanalization of the upper left arm cephalic vein.  Impression:Declot of the left upper extremity fistula.  There is improved flow through the fistula but the outflow is tenuous and high risk to occlude again.  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  Discussed the declot procedure with Dr. Hyman Hopes.   Recommend trying the fistula for dialysis.  If the fistula occludes again, recommend a catheter and surgical consultation for a new access.  Original Report Authenticated By: Richarda Overlie, M.D.   Ir US Guide Vasc Access Left  08/18/2011  *RADIOLOGY REPORT*  Clinical history:End-stage renal disease and clotted left upper extremity cephalic vein fistula.  PROCEDURE(S): DIALYSIS  FISTULA DECLOT; CEPHALIC VEIN ANGIOPLASTY; ULTRASOUND GUIDANCE FOR VASCULAR ACCESS  Physician: Rachelle Hora. Henn, MD  Medications:Heparin 3000 units, TPA 2 mg  Fluoroscopy time: 22.5 minutes  Contrast: 100 ml Omnipaque 300  Procedure:Informed consent was obtained for a declot procedure. The left upper arm was prepped and draped in a sterile fashion. Maximal barrier sterile technique was utilized including caps, mask, sterile gowns, sterile gloves, sterile drape, hand hygiene and skin antiseptic.  The skin was anesthetized with 1% lidocaine. The fistula was accessed using a 21 gauge needle towards  the central venous system with ultrasound guidance.  Ultrasound images were obtained for documentation. Micropuncture catheter was placed. A 6-French vascular sheath was placed.  A Kumpe catheter was advanced in the central veins using a Glidewire.  Central venogram was performed.  The catheter was pulled back as TPA was infused within the cephalic vein thrombus.  Fluoroscopic images were saved for documentation. A Bentson wire was placed centrally and the cephalic vein was treated with the AngioJet thrombectomy device. The cephalic vein was also angioplastied with a 6 mm x 40 mm balloon.  Thrombus was treated again with the AngioJet device and the PTD thrombectomy device.  Using ultrasound guidance, a 21 gauge needle was directed into the vein towards the arterial anastomosis.  Micropuncture dilator set was placed and a 6 [place] vascular sheath was placed. Although there was already flow from the arterial system, a Fogarty balloon was pulled through the vein in order to dislodge thrombus. There was improved flow within the fistula after using the Fogarty balloon.  The central cephalic vein was angioplastied with 6 mm x 40 mm balloon.  At this point, there was flow throughout the fistula.  However, the flow was only temporary.  The central cephalic vein and mid humeral cephalic vein required angioplasty multiple times in order to keep the cephalic vein patent.   At the end of the procedure, there was a pulse within the cephalic vein but poor outflow.  The vascular sheaths were removed with pursestring sutures.  Findings:The left cephalic vein fistula anastomosis is near the elbow.  The vein was patent near the arterial anastomosis but occluded in the mid and upper arm.  The central cephalic vein was also patent.  A large amount of the thrombus was removed using the AngioJet and mechanical thrombectomy device.  Eventually, there was some flow throughout the fistula after balloon angioplasty of a tight stenosis in the  central cephalic vein and recurrent stenosis in the mid humeral cephalic vein. At the end of the procedure, there was still a large amount of thrombus within the aneurysmal segment of the mid humeral cephalic vein.  Partial recanalization of the upper left arm cephalic vein.  Impression:Declot of the left upper extremity fistula.  There is improved flow through the fistula but the outflow is tenuous and high risk to occlude again.  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  Discussed the declot procedure with Dr. Hyman Hopes.   Recommend trying the fistula for dialysis.  If the fistula occludes again, recommend a catheter and surgical consultation for a new access.  Original Report Authenticated By: Richarda Overlie, M.D.     Medications:        . aspirin EC  81 mg Oral Daily  . ceFEPime (MAXIPIME) IV  1 g Intravenous Q24H  . darbepoetin (ARANESP) injection - DIALYSIS  60 mcg Intravenous Q Mon-HD  . famotidine  20 mg Oral QHS  . fentaNYL      . ferric gluconate (FERRLECIT/NULECIT) IV  125 mg Intravenous Q M,W,F-HD  . metoprolol tartrate  12.5 mg Oral BID  . metroNIDAZOLE  500 mg Oral Q8H  . midazolam      . sodium chloride  25 mL/kg Intravenous Once  . vancomycin  250 mg Oral Q6H  . DISCONTD: vancomycin  750 mg Intravenous Q M,W,F-HD  . DISCONTD: vancomycin  750 mg Intravenous Q Mon-1800  . DISCONTD: vancomycin  1,000 mg Intravenous Once   acetaminophen, heparin, metoprolol  Assessment/ Plan:  1. ESRD / usual MWF [place]- L upper arm AVF is clotted.temporary dialysis catheter 2. GI- no evidence SBO by xray, nonspecific BGP, formed stool yesterday. Wants to eat, ordered renal clear liquids, adv as tolerated 3. Leukocytosis- WBC up to 32K today. Does not look ill, maybe response to Cdif.  4. Lytic lesion right pelvis and abnormal appearance on CT of both kidneys - will need w/u  5. Peripheral Artery Disease - keep eye on left toe 6. Anemia - no idea of baseline Hb and  cannot get info from HD unit. 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 AFib/flutter - per primary 9. CDiff positive-on vanco and flagyl Plan dialysis after catheter. Patient refused catheter yesterday. Now is agreeable      LOS: 7 Abygail Galeno W @TODAY @11 :39 AM

## 2011-08-20 NOTE — Procedures (Signed)
Left IJ 23cm Hemosplit HD catheter No complication No blood loss. See complete dictation in [facility].

## 2011-08-20 NOTE — Progress Notes (Signed)
DAILY PROGRESS NOTE                              [facility]  SUBJECTIVE: Feels better today, waiting on his dialysis catheter be placed. Restarted dialysis.  OBJECTIVE: BP 142/68  Pulse 86  Temp(Src) 98.3 F (36.8 C) (Oral)  Resp 18  Ht 5\' 6"  (1.676 m)  Wt 66 kg (145 lb 8.1 oz)  BMI 23.48 kg/m2  SpO2 96%  Intake/Output Summary (Last 24 hours) at 08/20/11 1132 Last data filed at 08/20/11 0604  Gross per 24 hour  Intake   1440 ml  Output      0 ml  Net   1440 ml                      Weight change: -1.6 kg (-3 lb 8.4 oz) Physical Exam: General: Alert and awake oriented x3 not in any acute distress. HEENT: anicteric sclera, pupils equal reactive to light and accommodation CVS: S1-S2 heard, no murmur rubs or gallops Chest: clear to auscultation bilaterally, no wheezing rales or rhonchi Abdomen:  normal bowel sounds, soft, nontender, nondistended, no organomegaly Neuro: Cranial nerves II-XII intact, no focal neurological deficits Extremities: no cyanosis, no clubbing or edema noted bilaterally   Lab Results: No results found for this basename: NA:2,K:2,CL:2,CO2:2,GLUCOSE:2,BUN:2,CREATININE:2,CALCIUM:2,MG:2,PHOS:2 in the last 72 hours No results found for this basename: AST:2,ALT:2,ALKPHOS:2,BILITOT:2,PROT:2,ALBUMIN:2 in the last 72 hours No results found for this basename: LIPASE:2,AMYLASE:2 in the last 72 hours  Basename 08/19/11 1200  WBC 23.9*  NEUTROABS --  HGB 8.1*  HCT 25.5*  MCV 88.2  PLT 237   No results found for this basename: CKTOTAL:3,CKMB:3,CKMBINDEX:3,TROPONINI:3 in the last 72 hours No components found with this basename: POCBNP:3 No results found for this basename: DDIMER:2 in the last 72 hours No results found for this basename: HGBA1C:2 in the last 72 hours No results found for this basename: CHOL:2,HDL:2,LDLCALC:2,TRIG:2,CHOLHDL:2,LDLDIRECT:2 in the last 72 hours No results found for this basename:  TSH,T4TOTAL,FREET3,T3FREE,THYROIDAB in the last 72 hours No results found for this basename: VITAMINB12:2,FOLATE:2,FERRITIN:2,TIBC:2,IRON:2,RETICCTPCT:2 in the last 72 hours  Micro Results: Recent Results (from the past 240 hour(s))  CULTURE, BLOOD (ROUTINE X 2)     Status: Normal   Collection Time   08/13/11  3:15 PM      Component Value Range Status Comment   Specimen Description BLOOD CENTRAL LINE   Final    Special Requests BOTTLES DRAWN AEROBIC AND ANAEROBIC   Final    Culture  Setup Time [id number]   Final    Culture     Final    Value: DIPHTHEROIDS(CORYNEBACTERIUM SPECIES)     Note: Standardized susceptibility testing for this organism is not available.     Note: Gram Stain Report Called to,Read Back By and Verified With: GENIVEVE WROBLEWSKI @1150  08/15/11 BY KRAWS   Report Status 08/16/2011 FINAL   Final   CULTURE, BLOOD (ROUTINE X 2)     Status: Normal   Collection Time   08/13/11  3:50 PM      Component Value Range Status Comment   Specimen Description BLOOD HAND RIGHT   Final    Special Requests BOTTLES DRAWN AEROBIC ONLY 5CC   Final    Culture  Setup Time [id number]   Final    Culture NO GROWTH 5 DAYS   Final    Report Status 08/20/2011 FINAL   Final   MRSA PCR  SCREENING     Status: Normal   Collection Time   08/13/11  6:50 PM      Component Value Range Status Comment   MRSA by PCR NEGATIVE  NEGATIVE  Final   CLOSTRIDIUM DIFFICILE BY PCR     Status: Abnormal   Collection Time   08/13/11 10:46 PM      Component Value Range Status Comment   C difficile by pcr POSITIVE (*) NEGATIVE  Final   BODY FLUID CULTURE     Status: Normal   Collection Time   08/15/11  5:58 PM      Component Value Range Status Comment   Specimen Description PLEURAL FLUID RIGHT   Final    Special Requests NONE   Final    Gram Stain     Final    Value: RARE WBC PRESENT,BOTH PMN AND MONONUCLEAR     NO ORGANISMS SEEN   Culture NO GROWTH 3 DAYS   Final    Report Status 08/19/2011 FINAL    Final     Studies/Results: Ct Abdomen Pelvis Wo Contrast  08/13/2011  *RADIOLOGY REPORT*  Clinical Data: Abdominal pain.  Unresponsive.  CT ABDOMEN AND PELVIS WITHOUT CONTRAST  Technique:  Multidetector CT imaging of the abdomen and pelvis was performed following the standard protocol without intravenous contrast.  Comparison: None.  Findings: Lack of intravenous and oral contrast severely limits the exam.  Moderate bilateral pleural effusions and bibasilar consolidation. Coronary artery calcifications.  Aortic valvular calcifications.  Gallbladder is decompressed.  Liver is within normal limits.  Small amount of free fluid anterior to the liver.  The stomach is markedly distended.  Small bowel loops are also markedly distended.  Distal small bowel is decompressed.  There is a small amount of enteric contrast in the lower abdomen at the midline likely contained within a bowel loop.  Colon is also relatively decompressed.  Small amount of free fluid is seen layering in the pelvis.  The kidneys have a markedly abnormal appearance bilaterally.  The left kidney is enlarged and markedly deformed with areas of curvilinear calcification and mixed density.  Similar changes in the right kidney but to a lesser degree.  Underlying renal mass cannot be excluded.  Dense vascular calcifications are noted.  No obvious free intraperitoneal gas.  Metallic staples are seen in the anterior abdominal wall to the left of midline in the lower abdomen.  Prominent degenerative changes of the hip joints there is a lytic area in  in the right pelvis, above the right acetabulum.  There is loss of cortex in the overlying bone.  There is also a lytic area in the posterior inferior right acetabulum on image 88.  These lytic areas are greater than one would expect with simple degenerative changes.  Aggressive process cannot be excluded.  Gas within venous structures near the right groin are likely iatrogenic.  IMPRESSION: Very limited exam.   Bilateral pleural effusions and bibasilar consolidation.  Small bowel obstruction pattern.  The transition point is in the lower abdomen.  There are lytic lesions about the right acetabulum as described. An aggressive process such as metastatic disease or myeloma cannot be excluded.  MRI may be helpful.  Markedly abnormal appearance of the kidneys.  Renal mass cannot be excluded.  Original Report Authenticated By: Donavan Burnet, M.D.   Dg Chest 1 View  08/13/2011  *RADIOLOGY REPORT*  Clinical Data: Pneumonia  CHEST - 1 VIEW  Comparison: 08/13/2011  Findings: Diffuse airspace disease throughout the  right lung. Small right pleural effusion.  Minimal linear atelectasis at the left base.  Cardiomegaly.  Airspace disease on the left has improved.  IMPRESSION: Improved left airspace disease.  Stable airspace disease throughout the right lung with a right pleural effusion.  Original Report Authenticated By: Donavan Burnet, M.D.   Dg Chest 2 View  07/31/2011  *RADIOLOGY REPORT*  Clinical Data: Dyspnea on exertion  CHEST - 2 VIEW  Comparison: 09/10/2010  Findings: There heart size is mildly enlarged.  There is a moderate-sized right pleural effusion the left mid airspace consolidation involving the right lower lobe.  Left lung appears clear.  IMPRESSION:  1. Right pleural effusion and right lower lobe airspace consolidation.  These results will be called to the ordering clinician or representative by the Radiologist Assistant, and communication documented in the PACS Dashboard.  Original Report Authenticated By: Rosealee Albee, M.D.   Ir Pta Venous Left  08/18/2011  *RADIOLOGY REPORT*  Clinical history:End-stage renal disease and clotted left upper extremity cephalic vein fistula.  PROCEDURE(S): DIALYSIS  FISTULA DECLOT; CEPHALIC VEIN ANGIOPLASTY; ULTRASOUND GUIDANCE FOR VASCULAR ACCESS  Physician: Rachelle Hora. Henn, MD  Medications:Heparin 3000 units, TPA 2 mg  Fluoroscopy time: 22.5 minutes  Contrast: 100 ml  Omnipaque 300  Procedure:Informed consent was obtained for a declot procedure. The left upper arm was prepped and draped in a sterile fashion. Maximal barrier sterile technique was utilized including caps, mask, sterile gowns, sterile gloves, sterile drape, hand hygiene and skin antiseptic.  The skin was anesthetized with 1% lidocaine. The fistula was accessed using a 21 gauge needle towards the central venous system with ultrasound guidance.  Ultrasound images were obtained for documentation. Micropuncture catheter was placed. A 6-French vascular sheath was placed.  A Kumpe catheter was advanced in the central veins using a Glidewire.  Central venogram was performed.  The catheter was pulled back as TPA was infused within the cephalic vein thrombus.  Fluoroscopic images were saved for documentation. A Bentson wire was placed centrally and the cephalic vein was treated with the AngioJet thrombectomy device. The cephalic vein was also angioplastied with a 6 mm x 40 mm balloon.  Thrombus was treated again with the AngioJet device and the PTD thrombectomy device.  Using ultrasound guidance, a 21 gauge needle was directed into the vein towards the arterial anastomosis.  Micropuncture dilator set was placed and a 6 [place] vascular sheath was placed. Although there was already flow from the arterial system, a Fogarty balloon was pulled through the vein in order to dislodge thrombus. There was improved flow within the fistula after using the Fogarty balloon.  The central cephalic vein was angioplastied with 6 mm x 40 mm balloon.  At this point, there was flow throughout the fistula.  However, the flow was only temporary.  The central cephalic vein and mid humeral cephalic vein required angioplasty multiple times in order to keep the cephalic vein patent.   At the end of the procedure, there was a pulse within the cephalic vein but poor outflow.  The vascular sheaths were removed with pursestring sutures.  Findings:The left  cephalic vein fistula anastomosis is near the elbow.  The vein was patent near the arterial anastomosis but occluded in the mid and upper arm.  The central cephalic vein was also patent.  A large amount of the thrombus was removed using the AngioJet and mechanical thrombectomy device.  Eventually, there was some flow throughout the fistula after balloon angioplasty of a tight stenosis in  the central cephalic vein and recurrent stenosis in the mid humeral cephalic vein. At the end of the procedure, there was still a large amount of thrombus within the aneurysmal segment of the mid humeral cephalic vein.  Partial recanalization of the upper left arm cephalic vein.  Impression:Declot of the left upper extremity fistula.  There is improved flow through the fistula but the outflow is tenuous and high risk to occlude again.  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  Discussed the declot procedure with Dr. Hyman Hopes.   Recommend trying the fistula for dialysis.  If the fistula occludes again, recommend a catheter and surgical consultation for a new access.  Original Report Authenticated By: Richarda Overlie, M.D.   Ir Av Dialysis Graft Declot  08/18/2011  *RADIOLOGY REPORT*  Clinical history:End-stage renal disease and clotted left upper extremity cephalic vein fistula.  PROCEDURE(S): DIALYSIS  FISTULA DECLOT; CEPHALIC VEIN ANGIOPLASTY; ULTRASOUND GUIDANCE FOR VASCULAR ACCESS  Physician: Rachelle Hora. Henn, MD  Medications:Heparin 3000 units, TPA 2 mg  Fluoroscopy time: 22.5 minutes  Contrast: 100 ml Omnipaque 300  Procedure:Informed consent was obtained for a declot procedure. The left upper arm was prepped and draped in a sterile fashion. Maximal barrier sterile technique was utilized including caps, mask, sterile gowns, sterile gloves, sterile drape, hand hygiene and skin antiseptic.  The skin was anesthetized with 1% lidocaine. The fistula was accessed using a 21 gauge needle towards the central venous  system with ultrasound guidance.  Ultrasound images were obtained for documentation. Micropuncture catheter was placed. A 6-French vascular sheath was placed.  A Kumpe catheter was advanced in the central veins using a Glidewire.  Central venogram was performed.  The catheter was pulled back as TPA was infused within the cephalic vein thrombus.  Fluoroscopic images were saved for documentation. A Bentson wire was placed centrally and the cephalic vein was treated with the AngioJet thrombectomy device. The cephalic vein was also angioplastied with a 6 mm x 40 mm balloon.  Thrombus was treated again with the AngioJet device and the PTD thrombectomy device.  Using ultrasound guidance, a 21 gauge needle was directed into the vein towards the arterial anastomosis.  Micropuncture dilator set was placed and a 6 [place] vascular sheath was placed. Although there was already flow from the arterial system, a Fogarty balloon was pulled through the vein in order to dislodge thrombus. There was improved flow within the fistula after using the Fogarty balloon.  The central cephalic vein was angioplastied with 6 mm x 40 mm balloon.  At this point, there was flow throughout the fistula.  However, the flow was only temporary.  The central cephalic vein and mid humeral cephalic vein required angioplasty multiple times in order to keep the cephalic vein patent.   At the end of the procedure, there was a pulse within the cephalic vein but poor outflow.  The vascular sheaths were removed with pursestring sutures.  Findings:The left cephalic vein fistula anastomosis is near the elbow.  The vein was patent near the arterial anastomosis but occluded in the mid and upper arm.  The central cephalic vein was also patent.  A large amount of the thrombus was removed using the AngioJet and mechanical thrombectomy device.  Eventually, there was some flow throughout the fistula after balloon angioplasty of a tight stenosis in the central cephalic  vein and recurrent stenosis in the mid humeral cephalic vein. At the end of the procedure, there was still a large amount of thrombus  within the aneurysmal segment of the mid humeral cephalic vein.  Partial recanalization of the upper left arm cephalic vein.  Impression:Declot of the left upper extremity fistula.  There is improved flow through the fistula but the outflow is tenuous and high risk to occlude again.  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  Discussed the declot procedure with Dr. Hyman Hopes.   Recommend trying the fistula for dialysis.  If the fistula occludes again, recommend a catheter and surgical consultation for a new access.  Original Report Authenticated By: Richarda Overlie, M.D.   Ir Angio Av Shunt Addl Access  08/18/2011  *RADIOLOGY REPORT*  Clinical history:End-stage renal disease and clotted left upper extremity cephalic vein fistula.  PROCEDURE(S): DIALYSIS  FISTULA DECLOT; CEPHALIC VEIN ANGIOPLASTY; ULTRASOUND GUIDANCE FOR VASCULAR ACCESS  Physician: Rachelle Hora. Henn, MD  Medications:Heparin 3000 units, TPA 2 mg  Fluoroscopy time: 22.5 minutes  Contrast: 100 ml Omnipaque 300  Procedure:Informed consent was obtained for a declot procedure. The left upper arm was prepped and draped in a sterile fashion. Maximal barrier sterile technique was utilized including caps, mask, sterile gowns, sterile gloves, sterile drape, hand hygiene and skin antiseptic.  The skin was anesthetized with 1% lidocaine. The fistula was accessed using a 21 gauge needle towards the central venous system with ultrasound guidance.  Ultrasound images were obtained for documentation. Micropuncture catheter was placed. A 6-French vascular sheath was placed.  A Kumpe catheter was advanced in the central veins using a Glidewire.  Central venogram was performed.  The catheter was pulled back as TPA was infused within the cephalic vein thrombus.  Fluoroscopic images were saved for documentation. A Bentson wire  was placed centrally and the cephalic vein was treated with the AngioJet thrombectomy device. The cephalic vein was also angioplastied with a 6 mm x 40 mm balloon.  Thrombus was treated again with the AngioJet device and the PTD thrombectomy device.  Using ultrasound guidance, a 21 gauge needle was directed into the vein towards the arterial anastomosis.  Micropuncture dilator set was placed and a 6 [place] vascular sheath was placed. Although there was already flow from the arterial system, a Fogarty balloon was pulled through the vein in order to dislodge thrombus. There was improved flow within the fistula after using the Fogarty balloon.  The central cephalic vein was angioplastied with 6 mm x 40 mm balloon.  At this point, there was flow throughout the fistula.  However, the flow was only temporary.  The central cephalic vein and mid humeral cephalic vein required angioplasty multiple times in order to keep the cephalic vein patent.   At the end of the procedure, there was a pulse within the cephalic vein but poor outflow.  The vascular sheaths were removed with pursestring sutures.  Findings:The left cephalic vein fistula anastomosis is near the elbow.  The vein was patent near the arterial anastomosis but occluded in the mid and upper arm.  The central cephalic vein was also patent.  A large amount of the thrombus was removed using the AngioJet and mechanical thrombectomy device.  Eventually, there was some flow throughout the fistula after balloon angioplasty of a tight stenosis in the central cephalic vein and recurrent stenosis in the mid humeral cephalic vein. At the end of the procedure, there was still a large amount of thrombus within the aneurysmal segment of the mid humeral cephalic vein.  Partial recanalization of the upper left arm cephalic vein.  Impression:Declot of the left upper extremity  fistula.  There is improved flow through the fistula but the outflow is tenuous and high risk to occlude  again.  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  Discussed the declot procedure with Dr. Hyman Hopes.   Recommend trying the fistula for dialysis.  If the fistula occludes again, recommend a catheter and surgical consultation for a new access.  Original Report Authenticated By: Richarda Overlie, M.D.   Ir US Guide Vasc Access Left  08/18/2011  *RADIOLOGY REPORT*  Clinical history:End-stage renal disease and clotted left upper extremity cephalic vein fistula.  PROCEDURE(S): DIALYSIS  FISTULA DECLOT; CEPHALIC VEIN ANGIOPLASTY; ULTRASOUND GUIDANCE FOR VASCULAR ACCESS  Physician: Rachelle Hora. Henn, MD  Medications:Heparin 3000 units, TPA 2 mg  Fluoroscopy time: 22.5 minutes  Contrast: 100 ml Omnipaque 300  Procedure:Informed consent was obtained for a declot procedure. The left upper arm was prepped and draped in a sterile fashion. Maximal barrier sterile technique was utilized including caps, mask, sterile gowns, sterile gloves, sterile drape, hand hygiene and skin antiseptic.  The skin was anesthetized with 1% lidocaine. The fistula was accessed using a 21 gauge needle towards the central venous system with ultrasound guidance.  Ultrasound images were obtained for documentation. Micropuncture catheter was placed. A 6-French vascular sheath was placed.  A Kumpe catheter was advanced in the central veins using a Glidewire.  Central venogram was performed.  The catheter was pulled back as TPA was infused within the cephalic vein thrombus.  Fluoroscopic images were saved for documentation. A Bentson wire was placed centrally and the cephalic vein was treated with the AngioJet thrombectomy device. The cephalic vein was also angioplastied with a 6 mm x 40 mm balloon.  Thrombus was treated again with the AngioJet device and the PTD thrombectomy device.  Using ultrasound guidance, a 21 gauge needle was directed into the vein towards the arterial anastomosis.  Micropuncture dilator set was placed and a 6 [place]  vascular sheath was placed. Although there was already flow from the arterial system, a Fogarty balloon was pulled through the vein in order to dislodge thrombus. There was improved flow within the fistula after using the Fogarty balloon.  The central cephalic vein was angioplastied with 6 mm x 40 mm balloon.  At this point, there was flow throughout the fistula.  However, the flow was only temporary.  The central cephalic vein and mid humeral cephalic vein required angioplasty multiple times in order to keep the cephalic vein patent.   At the end of the procedure, there was a pulse within the cephalic vein but poor outflow.  The vascular sheaths were removed with pursestring sutures.  Findings:The left cephalic vein fistula anastomosis is near the elbow.  The vein was patent near the arterial anastomosis but occluded in the mid and upper arm.  The central cephalic vein was also patent.  A large amount of the thrombus was removed using the AngioJet and mechanical thrombectomy device.  Eventually, there was some flow throughout the fistula after balloon angioplasty of a tight stenosis in the central cephalic vein and recurrent stenosis in the mid humeral cephalic vein. At the end of the procedure, there was still a large amount of thrombus within the aneurysmal segment of the mid humeral cephalic vein.  Partial recanalization of the upper left arm cephalic vein.  Impression:Declot of the left upper extremity fistula.  There is improved flow through the fistula but the outflow is tenuous and high risk to occlude again.  Residual thrombus within the aneurysmal  segment of the cephalic vein.  Discussed the declot procedure with Dr. Hyman Hopes.   Recommend trying the fistula for dialysis.  If the fistula occludes again, recommend a catheter and surgical consultation for a new access.  Original Report Authenticated By: Richarda Overlie, M.D.   Dg Chest Port 1 View  08/16/2011  *RADIOLOGY REPORT*  Clinical Data: Pneumonia.  Pleural  effusion.  PORTABLE CHEST - 1 VIEW  Comparison: 08/15/2011  Findings: Nasogastric tube extends into the stomach.  Cardiomegaly noted with mild interstitial accentuation and increased obscuration of the right hemidiaphragm primarily ascribed to pleural effusion and associated passive atelectasis.  There is mild atelectasis at the left lung base along with a small left pleural effusion.  Lung volumes are slightly worsened compared to yesterday's exam.  Atherosclerotic calcification of the aortic arch noted. Glenohumeral degenerative findings noted.  IMPRESSION:  1.  Increase in apparent size of the right pleural effusion with associated passive atelectasis. 2.  Small left pleural effusion with left basilar atelectasis also noted. 3.  Cardiomegaly with minimal interstitial accentuation.  Original Report Authenticated By: Dellia Cloud, M.D.   Dg Chest Port 1 View  08/15/2011  *RADIOLOGY REPORT*  Clinical Data: Status post right thoracentesis.  PORTABLE CHEST - 1 VIEW  Comparison: Earlier the same day  Findings: 1556 hours.  Right pleural effusion has decreased in the interval.  No evidence for pneumothorax.  There is some subsegmental atelectasis at the right base. The NG tube passes into the stomach although the distal tip position is not included on the film. The cardiopericardial silhouette is enlarged.  Diffuse interstitial opacity is improved in the interval, suggesting resolving edema.  IMPRESSION: No evidence for pneumothorax after right thoracentesis.  Original Report Authenticated By: ERIC A. MANSELL, M.D.   Dg Chest Port 1 View  08/15/2011  *RADIOLOGY REPORT*  Clinical Data: Shortness of breath and pleural effusion.  PORTABLE CHEST - 1 VIEW  Comparison: 08/14/2011  Findings: The NG tube passes into the stomach although the distal tip position is not included on the film. The cardiopericardial silhouette is enlarged.  Vascular congestion with diffuse interstitial opacity is consistent with edema.   There is bibasilar atelectasis and right pleural effusion.  IMPRESSION: No substantial change.  Cardiomegaly with edema and right pleural effusion.  Original Report Authenticated By: ERIC A. MANSELL, M.D.   Portable Chest Xray In Am  08/14/2011  *RADIOLOGY REPORT*  Clinical Data: Respiratory failure  PORTABLE CHEST - 1 VIEW  Comparison: Aug 13, 2011  Findings: There is cardiomegaly and pulmonary edema, as well as a moderate sized right pleural effusion.  Overall, the findings  have not significantly changed.  The nasogastric tube tip courses off the inferior film.  IMPRESSION: No significant change in cardiomegaly, pulmonary edema, moderate sized right pleural effusion.  Original Report Authenticated By: Brandon Melnick, M.D.   Dg Chest Port 1 View  08/13/2011  *RADIOLOGY REPORT*  Clinical Data: Unresponsive.  Shortness of breath and abdominal distention.  PORTABLE CHEST - 1 VIEW  Comparison: 07/31/2011.  Findings: Trachea is midline.  Heart size stable.  There is bilateral air space disease, right greater than left, with progression from 07/31/2011.  Associated right pleural effusion. There may be a tiny left pleural effusion.  IMPRESSION: Worsening bilateral air space disease with an associated right pleural effusion.  Question tiny left pleural effusion.  Congestive heart failure is likely.  Original Report Authenticated By: Reyes Ivan, M.D.   Dg Abd Portable 1v  08/16/2011  *RADIOLOGY  REPORT*  Clinical Data: Small bowel obstruction  PORTABLE ABDOMEN - 1 VIEW  Comparison: 08/15/2011  Findings: Vascular calcifications and rim calcified left renal lesions noted.  Nasogastric tube extends into the stomach.  The right femoral line projects over the pelvis.  Left pelvic skin clips noted.  Currently most of the bowel is gasless.  The visualized loops of bowel do not demonstrate bowel dilatation.  Lumbar spondylosis is again noted. Pleural effusions noted at the lung bases, with cardiomegaly.  IMPRESSION:   1.  No dilated bowel is currently visible, although much of the bowel is gasless. 2.  Small right and smaller left pleural effusions. 3.  Calcified left renal cysts or masses. 4.  Vascular calcifications. 5.  Right femoral line tip projects over the right sacral ala.  Original Report Authenticated By: Dellia Cloud, M.D.   Dg Abd Portable 1v  08/15/2011  *RADIOLOGY REPORT*  Clinical Data: Small bowel obstruction.  PORTABLE ABDOMEN - 1 VIEW  Comparison: Plain films and CT abdomen and pelvis 08/13/2011.  Findings: New NG tube and right femoral catheter are in place. Surgical staples to the left of midline again noted.  Gas filled small bowel loops are again seen but appear less numerous and prominent.  Bilateral upper quadrant calcifications are noted and consistent with renal calcifications seen on CT.  IMPRESSION: Mild improvement small bowel obstruction.  Original Report Authenticated By: Bernadene Bell. Maricela Curet, M.D.   Dg Abd Portable 1v  08/13/2011  *RADIOLOGY REPORT*  Clinical Data: Unresponsive.  Shortness of breath and abdominal distention.  PORTABLE ABDOMEN - 1 VIEW  Comparison: None.  Findings: There is mild gaseous distention of small bowel loops. Some colonic gas and stool are seen.  Surgical skin staples overlie the left lower quadrant.  Peripherally calcified structure in the left paraspinal region has the shape of the left kidney.  Additional tubular calcifications are seen in the left upper quadrant.  There may be small calcifications in the right paraspinal region.  IMPRESSION:  1.  Mild small bowel distention with possible wall thickening.  CT abdomen pelvis with contrast would likely be helpful in further evaluation, as clinically indicated. 2.  Calcified structures in the left upper quadrant, and likely right paraspinal region, as discussed above.  These could also be further assessed with CT.  Original Report Authenticated By: Reyes Ivan, M.D.   Medications: Scheduled Meds:    . aspirin EC  81 mg Oral Daily  . ceFEPime (MAXIPIME) IV  1 g Intravenous Q24H  . darbepoetin (ARANESP) injection - DIALYSIS  60 mcg Intravenous Q Mon-HD  . famotidine  20 mg Oral QHS  . fentaNYL      . ferric gluconate (FERRLECIT/NULECIT) IV  125 mg Intravenous Q M,W,F-HD  . metoprolol tartrate  12.5 mg Oral BID  . metroNIDAZOLE  500 mg Oral Q8H  . midazolam      . sodium chloride  25 mL/kg Intravenous Once  . vancomycin  250 mg Oral Q6H  . DISCONTD: vancomycin  750 mg Intravenous Q M,W,F-HD  . DISCONTD: vancomycin  750 mg Intravenous Q Mon-1800  . DISCONTD: vancomycin  1,000 mg Intravenous Once   Continuous Infusions:  PRN Meds:.acetaminophen, heparin, metoprolol  ASSESSMENT & PLAN: Active Problems:  ESRD (end stage renal disease)  Hypertension  End stage renal disease  Peripheral vascular disease, unspecified  Pleural effusion  SBO (small bowel obstruction)  Pneumonia  C. difficile colitis  Atrial fibrillation with RVR  Atrial flutter  CAD (coronary artery disease)  Sepsis -Patient admitted with septic shock to the ICU with the need of support of vasopressors. -Weaned quickly off of the vasopressors, his ejection fraction about 50% with inferior/septal hypokinesis. -Likely secondary to pneumonia and C. difficile colitis, resolved now.  C. difficile colitis -Positive C. difficile PCR. Patient is on Flagyl, vancomycin added yesterday. -Patient has no bowel movements for the past 3 days. I will evaluate KUB as he has mildly distended abdomen. -Patient also on vancomycin and cefepime for pneumonia.  End stage renal disease -On hemodialysis, appreciate nephrology help. Patient to get dialysis catheter today by interventional radiology.  Atrial fibrillation/flutter with RVR -This is new onset abdomen the ICU with a blood pressure was low. -Currently sinus rhythm, and his rate is controlled. -Initially Coumadin recommended, but final recommendation is to start  aspirin. -Patient to discuss the benefits of anticoagulations with his primary care physician.  Hypertension -Controlled, continue preadmission medications.  Pleural effusion -Pleural effusion was tapped while patient in the ICU. -According to the LDH and this is his transudative process likely secondary to his renal/heart disease. -Pulmonary was following, recommended no further management  Disposition -Patient agreed to place dialysis catheter today. -Likely home in 1-2 days, if it's okay with nephrology.    LOS: 7 days   Becky Berberian A 08/20/2011, 11:32 AM

## 2011-08-20 NOTE — Progress Notes (Signed)
Patient now agreeable for tunneled HD catheter placement - has been NPO - consent in chart.  IR contacted to make sure done later today.

## 2011-08-20 NOTE — Progress Notes (Signed)
OT Cancellation Note  Treatment cancelled today due to patient receiving procedure or test. Will check back another time.  Dub Maclellan A OTR/L [phone] 08/20/2011, 3:22 PM

## 2011-08-20 NOTE — Progress Notes (Signed)
1730- RN to bedside after patient returned from IR.  Patient refusing medications, refusing telemetry, agitated and telling nurse "do not touch me". NT to bedside for vitals ... NT reported to RN that patient stated he wanted to kill himself.  RN returned to bedside and asked patient if he wanted to kill himself.  Patient stated "yes".  Patient did not elaborate on why he felt this way, and when RN asked if he had a plan he said "no."  Suicide precautions initiated, MD notified. Sitter to bedside.  08/20/2011 Morley Kos

## 2011-08-20 NOTE — Progress Notes (Signed)
   Notified by nursing staff that [name] is having suicidal ideations.  Apparently he does not have a plan to hurt himself or other people.  Sitter at bedside for safety.  I consulted psychiatry for further evaluation for his suicidal ideation, they likely to see him in the morning.  Clint Lipps Pager: [phone] 08/20/2011, 5:42 PM

## 2011-08-20 NOTE — Progress Notes (Signed)
Palliative Medicine Consult requested from Dr Hyman Hopes for goals of care discussion as patient was not wanting to continue HD; spoke with patient at bedside and Dr Hyman Hopes - at this time patient is declining to meet with PMT and agreeable to continuing with HD - patient stated the plan is for cath placement today at 4 pm; - spoke with Dr Hyman Hopes who states consult not necessary at this time - after discussion with Dr Hyman Hopes- patient's sister Jacobey Gura returned call to PMT and confirms patients wants to continue HD and not wanting to meet with PMT -  PMT will sign off at this time- please re-consult if team can be of service in the future.  Thank You.   Valente David, RN 08/20/2011, 2:02 PM Palliative Medicine Team RN Liaison [phone]

## 2011-08-21 DIAGNOSIS — D7289 Other specified disorders of white blood cells: Secondary | ICD-10-CM

## 2011-08-21 DIAGNOSIS — A0472 Enterocolitis due to Clostridium difficile, not specified as recurrent: Secondary | ICD-10-CM

## 2011-08-21 DIAGNOSIS — N186 End stage renal disease: Secondary | ICD-10-CM

## 2011-08-21 DIAGNOSIS — F0390 Unspecified dementia without behavioral disturbance: Secondary | ICD-10-CM

## 2011-08-21 LAB — PROTEIN ELECTROPHORESIS, SERUM
Alpha-1-Globulin: 11.1 % — ABNORMAL HIGH (ref 2.9–4.9)
Alpha-2-Globulin: 16.8 % — ABNORMAL HIGH (ref 7.1–11.8)
Beta Globulin: 3.5 % — ABNORMAL LOW (ref 4.7–7.2)
M-Spike, %: NOT DETECTED g/dL
Total Protein ELP: 4.3 g/dL — ABNORMAL LOW (ref 6.0–8.3)

## 2011-08-21 MED ORDER — VANCOMYCIN HCL 500 MG IV SOLR
500.0000 mg | INTRAVENOUS | Status: AC
  Administered 2011-08-21: 500 mg via INTRAVENOUS
  Filled 2011-08-21: qty 500

## 2011-08-21 MED ORDER — FAMOTIDINE 40 MG PO TABS
40.0000 mg | ORAL_TABLET | Freq: Every day | ORAL | Status: DC
  Administered 2011-08-21 – 2011-08-22 (×2): 40 mg via ORAL
  Filled 2011-08-21 (×3): qty 1

## 2011-08-21 MED FILL — Heparin Sodium (Porcine) Inj 1000 Unit/ML: INTRAMUSCULAR | Qty: 10 | Status: AC

## 2011-08-21 NOTE — Consult Note (Addendum)
Clinical Social Work Department CLINICAL SOCIAL WORK PSYCHIATRY SERVICE LINE ASSESSMENT 08/21/2011  Patient:  [name] [facility]  Account:  [id number]  Admit Date:  08/13/2011  Clinical Social Worker:  Ashley Jacobs, LCSW  Date/Time:  08/21/2011 09:38 AM Referred by:  Physician  Date referred:  08/21/2011 Reason for Referral  Behavioral Health Issues   Presenting Symptoms/Problems (In the person's/family's own words):   Patient endorses suicidal ideation without plan nor intent. Patient placed on suicide percaustions and consult for suicidal ideation and protocol/assessment of severity and intent  Admitted to hospital for:  ESRD; CVA; HTN; CHF; PVD     Abuse/Neglect/Trauma Comments:   Patient very guarded unable to assess appropriately.   Psychiatric History (check all that apply)  Denies history   Psychiatric medications:  Patient reports he has taken medication in the past however unknown for what diagnosis or medication   Current Mental Health Hospitalizations/Previous Mental Health History:   unknown patient would not disclose information   Current provider:   Dialysis outpatient  PCP  no Psych MD or counseling   Place and Date:   unknown   Current Medications:   Scheduled Meds:   . aspirin EC  81 mg Oral Daily  . ceFEPime (MAXIPIME) IV  1 g Intravenous Q24H  . darbepoetin (ARANESP) injection - DIALYSIS  60 mcg Intravenous Q Mon-HD  . famotidine  20 mg Oral QHS  . ferric gluconate (FERRLECIT/NULECIT) IV  125 mg Intravenous Q M,W,F-HD  . metoprolol tartrate  12.5 mg Oral BID  . metroNIDAZOLE  500 mg Oral Q8H  . sodium chloride  25 mL/kg Intravenous Once  . vancomycin  250 mg Oral Q6H  . vancomycin  500 mg Intravenous To Hemo  . DISCONTD: metoprolol tartrate  12.5 mg Oral BID  . DISCONTD: vancomycin  750 mg Intravenous Q M,W,F-HD  . DISCONTD: vancomycin  750 mg Intravenous Q Mon-1800  . DISCONTD: vancomycin  1,000 mg Intravenous Once   Continuous Infusions:  PRN  Meds:.acetaminophen, heparin, metoprolol   Previous Impatient Admission/Date/Reason:   unknown   Emotional Health / Current Symptoms    Suicide/Self Harm  None reported   Suicide attempt in the past:   On 08/20/2011 patient endorses thoughts of killing himself and sucide intent.  At the current time 08/21/2011 patient does not endorse any thoughts or passive thoughts.  When asked if he said something out of frustration, he reports yes and he was nothing trying to kill himself.   Other harmful behavior:   noncompliance at times   Psychotic/Dissociative Symptoms  Confusion   Other Psychotic/Dissociative Symptoms:   Patient reports to this writer he knows something is wrong with him.  When asked what he means by that statement he reports:  Something medically and mentally is wrong with me.  Sitter also reports he has been acting confused still this morning    Attention/Behavioral Symptoms  Inattentive  Withdrawn   Other Attention / Behavioral Symptoms:   Patient lying on back in bed.  Does not make eye contact but seldomly.  Agreeable to assessment, but does not participate. He is very guarded.  Unclear if he is confused which is why he is guarded, but patient only speaks minimally.    Cognitive Impairment  Impairment due to current medical condition/treatment (stroke,reaction to medication,reaction to infections,etc)   Other Cognitive Impairment:    Mood and Adjustment  Guarded  Confused  Flat    Stress, Anxiety, Trauma, Any Recent Loss/Stressor  Other - See comment  Anxiety (frequency):   Phobia (specify):   Compulsive behavior (specify):   Obsessive behavior (specify):   Other:   Patient reports he gets tired of living this way at times with all his medical problems and dialysis.  reports he has lived this way for a while and it just gets exhausting.  He would not discuss anything else in further detail.   Substance Abuse/Use  None   SBIRT completed (please refer  for detailed history):  N  Self-reported substance use:   unknown, patient did not disclose   Urinary Drug Screen Completed:  N Alcohol level:   NA    Environmental/Housing/Living Arrangement  Stable housing   Who is in the home:   other family members   Emergency contact:  Patient sisiter: Donise per CSW assessment   Financial  Medicare  Medicaid   Patient's Strengths and Goals (patient's own words):   Patient would not disclose   Clinical Social Worker's Interpretive Summary:   Assessment was limited due to patient no cooperative nor able to give accurate information.  Unclear of baseline at this time. Patient denies any SI at this time without plan, intent, or thoughts per his report.  He was very internally preoccupied staring at wall and when asked what he was thinking about he just closed his eyes.  Sitter still to remain until full assessment can be conducted.  Frustration seems to be the reason for endorsing SI.  Patient was not verbally or physically aggressive and he was very calm but guarded and did not want to participate   Disposition:  Recommend Psych CSW continuing to support while in hospital.  Awaiting Psych review of assessment and patient and will also follow up with recommendations.  Patient asked if he wanted to begin medication for mood and depression and he was agreeable.  Also discussed counseling and he did not respond at this time.  Ashley Jacobs, MSW LCSW [phone]

## 2011-08-21 NOTE — Progress Notes (Signed)
61yo male has been on vancomycin since admission but has been refusing HD, has rec'd two doses of vanc since last HD; now in HD, will give vancomcyin 500mg  IV x1 and continue to monitor.  Vernard Gambles, PharmD, BCPS 08/21/2011 2:15 AM

## 2011-08-21 NOTE — Progress Notes (Signed)
DAILY PROGRESS NOTE                              [facility]AL [facility]  SUBJECTIVE: Feels better today, he is confused and disoriented.  OBJECTIVE: BP 123/69  Pulse 98  Temp(Src) 97.8 F (36.6 C) (Oral)  Resp 18  Ht 5\' 6"  (1.676 m)  Wt 68.9 kg (151 lb 14.4 oz)  BMI 24.52 kg/m2  SpO2 95%  Intake/Output Summary (Last 24 hours) at 08/21/11 0952 Last data filed at 08/21/11 0930  Gross per 24 hour  Intake      0 ml  Output     97 ml  Net    -97 ml                      Weight change: 2.9 kg (6 lb 6.3 oz) Physical Exam: General: Alert and awake, but disoriented. not in any acute distress. HEENT: anicteric sclera, pupils equal reactive to light and accommodation CVS: S1-S2 heard, no murmur rubs or gallops Chest: clear to auscultation bilaterally, no wheezing rales or rhonchi Abdomen:  normal bowel sounds, soft, nontender, nondistended, no organomegaly Neuro: Cranial nerves II-XII intact, no focal neurological deficits Extremities: no cyanosis, no clubbing or edema noted bilaterally   Lab Results: No results found for this basename: NA:2,K:2,CL:2,CO2:2,GLUCOSE:2,BUN:2,CREATININE:2,CALCIUM:2,MG:2,PHOS:2 in the last 72 hours No results found for this basename: AST:2,ALT:2,ALKPHOS:2,BILITOT:2,PROT:2,ALBUMIN:2 in the last 72 hours No results found for this basename: LIPASE:2,AMYLASE:2 in the last 72 hours  Basename 08/19/11 1200  WBC 23.9*  NEUTROABS --  HGB 8.1*  HCT 25.5*  MCV 88.2  PLT 237   No results found for this basename: CKTOTAL:3,CKMB:3,CKMBINDEX:3,TROPONINI:3 in the last 72 hours No components found with this basename: POCBNP:3 No results found for this basename: DDIMER:2 in the last 72 hours No results found for this basename: HGBA1C:2 in the last 72 hours No results found for this basename: CHOL:2,HDL:2,LDLCALC:2,TRIG:2,CHOLHDL:2,LDLDIRECT:2 in the last 72 hours No results found for this basename: TSH,T4TOTAL,FREET3,T3FREE,THYROIDAB in the  last 72 hours No results found for this basename: VITAMINB12:2,FOLATE:2,FERRITIN:2,TIBC:2,IRON:2,RETICCTPCT:2 in the last 72 hours  Micro Results: Recent Results (from the past 240 hour(s))  CULTURE, BLOOD (ROUTINE X 2)     Status: Normal   Collection Time   08/13/11  3:15 PM      Component Value Range Status Comment   Specimen Description BLOOD CENTRAL LINE   Final    Special Requests BOTTLES DRAWN AEROBIC AND ANAEROBIC   Final    Culture  Setup Time [id number]   Final    Culture     Final    Value: DIPHTHEROIDS(CORYNEBACTERIUM SPECIES)     Note: Standardized susceptibility testing for this organism is not available.     Note: Gram Stain Report Called to,Read Back By and Verified With: GENIVEVE WROBLEWSKI @1150  08/15/11 BY KRAWS   Report Status 08/16/2011 FINAL   Final   CULTURE, BLOOD (ROUTINE X 2)     Status: Normal   Collection Time   08/13/11  3:50 PM      Component Value Range Status Comment   Specimen Description BLOOD HAND RIGHT   Final    Special Requests BOTTLES DRAWN AEROBIC ONLY 5CC   Final    Culture  Setup Time [id number]   Final    Culture NO GROWTH 5 DAYS   Final    Report Status 08/20/2011 FINAL   Final   MRSA PCR SCREENING  Status: Normal   Collection Time   08/13/11  6:50 PM      Component Value Range Status Comment   MRSA by PCR NEGATIVE  NEGATIVE  Final   CLOSTRIDIUM DIFFICILE BY PCR     Status: Abnormal   Collection Time   08/13/11 10:46 PM      Component Value Range Status Comment   C difficile by pcr POSITIVE (*) NEGATIVE  Final   BODY FLUID CULTURE     Status: Normal   Collection Time   08/15/11  5:58 PM      Component Value Range Status Comment   Specimen Description PLEURAL FLUID RIGHT   Final    Special Requests NONE   Final    Gram Stain     Final    Value: RARE WBC PRESENT,BOTH PMN AND MONONUCLEAR     NO ORGANISMS SEEN   Culture NO GROWTH 3 DAYS   Final    Report Status 08/19/2011 FINAL   Final     Studies/Results: Ct Abdomen  Pelvis Wo Contrast  08/13/2011  *RADIOLOGY REPORT*  Clinical Data: Abdominal pain.  Unresponsive.  CT ABDOMEN AND PELVIS WITHOUT CONTRAST  Technique:  Multidetector CT imaging of the abdomen and pelvis was performed following the standard protocol without intravenous contrast.  Comparison: None.  Findings: Lack of intravenous and oral contrast severely limits the exam.  Moderate bilateral pleural effusions and bibasilar consolidation. Coronary artery calcifications.  Aortic valvular calcifications.  Gallbladder is decompressed.  Liver is within normal limits.  Small amount of free fluid anterior to the liver.  The stomach is markedly distended.  Small bowel loops are also markedly distended.  Distal small bowel is decompressed.  There is a small amount of enteric contrast in the lower abdomen at the midline likely contained within a bowel loop.  Colon is also relatively decompressed.  Small amount of free fluid is seen layering in the pelvis.  The kidneys have a markedly abnormal appearance bilaterally.  The left kidney is enlarged and markedly deformed with areas of curvilinear calcification and mixed density.  Similar changes in the right kidney but to a lesser degree.  Underlying renal mass cannot be excluded.  Dense vascular calcifications are noted.  No obvious free intraperitoneal gas.  Metallic staples are seen in the anterior abdominal wall to the left of midline in the lower abdomen.  Prominent degenerative changes of the hip joints there is a lytic area in  in the right pelvis, above the right acetabulum.  There is loss of cortex in the overlying bone.  There is also a lytic area in the posterior inferior right acetabulum on image 88.  These lytic areas are greater than one would expect with simple degenerative changes.  Aggressive process cannot be excluded.  Gas within venous structures near the right groin are likely iatrogenic.  IMPRESSION: Very limited exam.  Bilateral pleural effusions and bibasilar  consolidation.  Small bowel obstruction pattern.  The transition point is in the lower abdomen.  There are lytic lesions about the right acetabulum as described. An aggressive process such as metastatic disease or myeloma cannot be excluded.  MRI may be helpful.  Markedly abnormal appearance of the kidneys.  Renal mass cannot be excluded.  Original Report Authenticated By: Donavan Burnet, M.D.   Dg Chest 1 View  08/13/2011  *RADIOLOGY REPORT*  Clinical Data: Pneumonia  CHEST - 1 VIEW  Comparison: 08/13/2011  Findings: Diffuse airspace disease throughout the right lung. Small right pleural  effusion.  Minimal linear atelectasis at the left base.  Cardiomegaly.  Airspace disease on the left has improved.  IMPRESSION: Improved left airspace disease.  Stable airspace disease throughout the right lung with a right pleural effusion.  Original Report Authenticated By: Donavan Burnet, M.D.   Dg Chest 2 View  07/31/2011  *RADIOLOGY REPORT*  Clinical Data: Dyspnea on exertion  CHEST - 2 VIEW  Comparison: 09/10/2010  Findings: There heart size is mildly enlarged.  There is a moderate-sized right pleural effusion the left mid airspace consolidation involving the right lower lobe.  Left lung appears clear.  IMPRESSION:  1. Right pleural effusion and right lower lobe airspace consolidation.  These results will be called to the ordering clinician or representative by the Radiologist Assistant, and communication documented in the PACS Dashboard.  Original Report Authenticated By: Rosealee Albee, M.D.   Ir Pta Venous Left  08/18/2011  *RADIOLOGY REPORT*  Clinical history:End-stage renal disease and clotted left upper extremity cephalic vein fistula.  PROCEDURE(S): DIALYSIS  FISTULA DECLOT; CEPHALIC VEIN ANGIOPLASTY; ULTRASOUND GUIDANCE FOR VASCULAR ACCESS  Physician: Rachelle Hora. Henn, MD  Medications:Heparin 3000 units, TPA 2 mg  Fluoroscopy time: 22.5 minutes  Contrast: 100 ml Omnipaque 300  Procedure:Informed consent was  obtained for a declot procedure. The left upper arm was prepped and draped in a sterile fashion. Maximal barrier sterile technique was utilized including caps, mask, sterile gowns, sterile gloves, sterile drape, hand hygiene and skin antiseptic.  The skin was anesthetized with 1% lidocaine. The fistula was accessed using a 21 gauge needle towards the central venous system with ultrasound guidance.  Ultrasound images were obtained for documentation. Micropuncture catheter was placed. A 6-French vascular sheath was placed.  A Kumpe catheter was advanced in the central veins using a Glidewire.  Central venogram was performed.  The catheter was pulled back as TPA was infused within the cephalic vein thrombus.  Fluoroscopic images were saved for documentation. A Bentson wire was placed centrally and the cephalic vein was treated with the AngioJet thrombectomy device. The cephalic vein was also angioplastied with a 6 mm x 40 mm balloon.  Thrombus was treated again with the AngioJet device and the PTD thrombectomy device.  Using ultrasound guidance, a 21 gauge needle was directed into the vein towards the arterial anastomosis.  Micropuncture dilator set was placed and a 6 [place] vascular sheath was placed. Although there was already flow from the arterial system, a Fogarty balloon was pulled through the vein in order to dislodge thrombus. There was improved flow within the fistula after using the Fogarty balloon.  The central cephalic vein was angioplastied with 6 mm x 40 mm balloon.  At this point, there was flow throughout the fistula.  However, the flow was only temporary.  The central cephalic vein and mid humeral cephalic vein required angioplasty multiple times in order to keep the cephalic vein patent.   At the end of the procedure, there was a pulse within the cephalic vein but poor outflow.  The vascular sheaths were removed with pursestring sutures.  Findings:The left cephalic vein fistula anastomosis is near the  elbow.  The vein was patent near the arterial anastomosis but occluded in the mid and upper arm.  The central cephalic vein was also patent.  A large amount of the thrombus was removed using the AngioJet and mechanical thrombectomy device.  Eventually, there was some flow throughout the fistula after balloon angioplasty of a tight stenosis in the central cephalic vein and  recurrent stenosis in the mid humeral cephalic vein. At the end of the procedure, there was still a large amount of thrombus within the aneurysmal segment of the mid humeral cephalic vein.  Partial recanalization of the upper left arm cephalic vein.  Impression:Declot of the left upper extremity fistula.  There is improved flow through the fistula but the outflow is tenuous and high risk to occlude again.  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  Discussed the declot procedure with Dr. Hyman Hopes.   Recommend trying the fistula for dialysis.  If the fistula occludes again, recommend a catheter and surgical consultation for a new access.  Original Report Authenticated By: Richarda Overlie, M.D.   Ir Av Dialysis Graft Declot  08/18/2011  *RADIOLOGY REPORT*  Clinical history:End-stage renal disease and clotted left upper extremity cephalic vein fistula.  PROCEDURE(S): DIALYSIS  FISTULA DECLOT; CEPHALIC VEIN ANGIOPLASTY; ULTRASOUND GUIDANCE FOR VASCULAR ACCESS  Physician: Rachelle Hora. Henn, MD  Medications:Heparin 3000 units, TPA 2 mg  Fluoroscopy time: 22.5 minutes  Contrast: 100 ml Omnipaque 300  Procedure:Informed consent was obtained for a declot procedure. The left upper arm was prepped and draped in a sterile fashion. Maximal barrier sterile technique was utilized including caps, mask, sterile gowns, sterile gloves, sterile drape, hand hygiene and skin antiseptic.  The skin was anesthetized with 1% lidocaine. The fistula was accessed using a 21 gauge needle towards the central venous system with ultrasound guidance.  Ultrasound  images were obtained for documentation. Micropuncture catheter was placed. A 6-French vascular sheath was placed.  A Kumpe catheter was advanced in the central veins using a Glidewire.  Central venogram was performed.  The catheter was pulled back as TPA was infused within the cephalic vein thrombus.  Fluoroscopic images were saved for documentation. A Bentson wire was placed centrally and the cephalic vein was treated with the AngioJet thrombectomy device. The cephalic vein was also angioplastied with a 6 mm x 40 mm balloon.  Thrombus was treated again with the AngioJet device and the PTD thrombectomy device.  Using ultrasound guidance, a 21 gauge needle was directed into the vein towards the arterial anastomosis.  Micropuncture dilator set was placed and a 6 [place] vascular sheath was placed. Although there was already flow from the arterial system, a Fogarty balloon was pulled through the vein in order to dislodge thrombus. There was improved flow within the fistula after using the Fogarty balloon.  The central cephalic vein was angioplastied with 6 mm x 40 mm balloon.  At this point, there was flow throughout the fistula.  However, the flow was only temporary.  The central cephalic vein and mid humeral cephalic vein required angioplasty multiple times in order to keep the cephalic vein patent.   At the end of the procedure, there was a pulse within the cephalic vein but poor outflow.  The vascular sheaths were removed with pursestring sutures.  Findings:The left cephalic vein fistula anastomosis is near the elbow.  The vein was patent near the arterial anastomosis but occluded in the mid and upper arm.  The central cephalic vein was also patent.  A large amount of the thrombus was removed using the AngioJet and mechanical thrombectomy device.  Eventually, there was some flow throughout the fistula after balloon angioplasty of a tight stenosis in the central cephalic vein and recurrent stenosis in the mid humeral  cephalic vein. At the end of the procedure, there was still a large amount of thrombus within the aneurysmal segment of  the mid humeral cephalic vein.  Partial recanalization of the upper left arm cephalic vein.  Impression:Declot of the left upper extremity fistula.  There is improved flow through the fistula but the outflow is tenuous and high risk to occlude again.  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  Discussed the declot procedure with Dr. Hyman Hopes.   Recommend trying the fistula for dialysis.  If the fistula occludes again, recommend a catheter and surgical consultation for a new access.  Original Report Authenticated By: Richarda Overlie, M.D.   Ir Angio Av Shunt Addl Access  08/18/2011  *RADIOLOGY REPORT*  Clinical history:End-stage renal disease and clotted left upper extremity cephalic vein fistula.  PROCEDURE(S): DIALYSIS  FISTULA DECLOT; CEPHALIC VEIN ANGIOPLASTY; ULTRASOUND GUIDANCE FOR VASCULAR ACCESS  Physician: Rachelle Hora. Henn, MD  Medications:Heparin 3000 units, TPA 2 mg  Fluoroscopy time: 22.5 minutes  Contrast: 100 ml Omnipaque 300  Procedure:Informed consent was obtained for a declot procedure. The left upper arm was prepped and draped in a sterile fashion. Maximal barrier sterile technique was utilized including caps, mask, sterile gowns, sterile gloves, sterile drape, hand hygiene and skin antiseptic.  The skin was anesthetized with 1% lidocaine. The fistula was accessed using a 21 gauge needle towards the central venous system with ultrasound guidance.  Ultrasound images were obtained for documentation. Micropuncture catheter was placed. A 6-French vascular sheath was placed.  A Kumpe catheter was advanced in the central veins using a Glidewire.  Central venogram was performed.  The catheter was pulled back as TPA was infused within the cephalic vein thrombus.  Fluoroscopic images were saved for documentation. A Bentson wire was placed centrally and the cephalic vein was  treated with the AngioJet thrombectomy device. The cephalic vein was also angioplastied with a 6 mm x 40 mm balloon.  Thrombus was treated again with the AngioJet device and the PTD thrombectomy device.  Using ultrasound guidance, a 21 gauge needle was directed into the vein towards the arterial anastomosis.  Micropuncture dilator set was placed and a 6 [place] vascular sheath was placed. Although there was already flow from the arterial system, a Fogarty balloon was pulled through the vein in order to dislodge thrombus. There was improved flow within the fistula after using the Fogarty balloon.  The central cephalic vein was angioplastied with 6 mm x 40 mm balloon.  At this point, there was flow throughout the fistula.  However, the flow was only temporary.  The central cephalic vein and mid humeral cephalic vein required angioplasty multiple times in order to keep the cephalic vein patent.   At the end of the procedure, there was a pulse within the cephalic vein but poor outflow.  The vascular sheaths were removed with pursestring sutures.  Findings:The left cephalic vein fistula anastomosis is near the elbow.  The vein was patent near the arterial anastomosis but occluded in the mid and upper arm.  The central cephalic vein was also patent.  A large amount of the thrombus was removed using the AngioJet and mechanical thrombectomy device.  Eventually, there was some flow throughout the fistula after balloon angioplasty of a tight stenosis in the central cephalic vein and recurrent stenosis in the mid humeral cephalic vein. At the end of the procedure, there was still a large amount of thrombus within the aneurysmal segment of the mid humeral cephalic vein.  Partial recanalization of the upper left arm cephalic vein.  Impression:Declot of the left upper extremity fistula.  There is improved  flow through the fistula but the outflow is tenuous and high risk to occlude again.  Residual thrombus within the aneurysmal  segment of the cephalic vein.  Discussed the declot procedure with Dr. Hyman Hopes.   Recommend trying the fistula for dialysis.  If the fistula occludes again, recommend a catheter and surgical consultation for a new access.  Original Report Authenticated By: Richarda Overlie, M.D.   Ir US Guide Vasc Access Left  08/18/2011  *RADIOLOGY REPORT*  Clinical history:End-stage renal disease and clotted left upper extremity cephalic vein fistula.  PROCEDURE(S): DIALYSIS  FISTULA DECLOT; CEPHALIC VEIN ANGIOPLASTY; ULTRASOUND GUIDANCE FOR VASCULAR ACCESS  Physician: Rachelle Hora. Henn, MD  Medications:Heparin 3000 units, TPA 2 mg  Fluoroscopy time: 22.5 minutes  Contrast: 100 ml Omnipaque 300  Procedure:Informed consent was obtained for a declot procedure. The left upper arm was prepped and draped in a sterile fashion. Maximal barrier sterile technique was utilized including caps, mask, sterile gowns, sterile gloves, sterile drape, hand hygiene and skin antiseptic.  The skin was anesthetized with 1% lidocaine. The fistula was accessed using a 21 gauge needle towards the central venous system with ultrasound guidance.  Ultrasound images were obtained for documentation. Micropuncture catheter was placed. A 6-French vascular sheath was placed.  A Kumpe catheter was advanced in the central veins using a Glidewire.  Central venogram was performed.  The catheter was pulled back as TPA was infused within the cephalic vein thrombus.  Fluoroscopic images were saved for documentation. A Bentson wire was placed centrally and the cephalic vein was treated with the AngioJet thrombectomy device. The cephalic vein was also angioplastied with a 6 mm x 40 mm balloon.  Thrombus was treated again with the AngioJet device and the PTD thrombectomy device.  Using ultrasound guidance, a 21 gauge needle was directed into the vein towards the arterial anastomosis.  Micropuncture dilator set was placed and a 6 [place] vascular sheath was placed. Although there was  already flow from the arterial system, a Fogarty balloon was pulled through the vein in order to dislodge thrombus. There was improved flow within the fistula after using the Fogarty balloon.  The central cephalic vein was angioplastied with 6 mm x 40 mm balloon.  At this point, there was flow throughout the fistula.  However, the flow was only temporary.  The central cephalic vein and mid humeral cephalic vein required angioplasty multiple times in order to keep the cephalic vein patent.   At the end of the procedure, there was a pulse within the cephalic vein but poor outflow.  The vascular sheaths were removed with pursestring sutures.  Findings:The left cephalic vein fistula anastomosis is near the elbow.  The vein was patent near the arterial anastomosis but occluded in the mid and upper arm.  The central cephalic vein was also patent.  A large amount of the thrombus was removed using the AngioJet and mechanical thrombectomy device.  Eventually, there was some flow throughout the fistula after balloon angioplasty of a tight stenosis in the central cephalic vein and recurrent stenosis in the mid humeral cephalic vein. At the end of the procedure, there was still a large amount of thrombus within the aneurysmal segment of the mid humeral cephalic vein.  Partial recanalization of the upper left arm cephalic vein.  Impression:Declot of the left upper extremity fistula.  There is improved flow through the fistula but the outflow is tenuous and high risk to occlude again.  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  Discussed the declot procedure with Dr. Hyman Hopes.   Recommend trying the fistula for dialysis.  If the fistula occludes again, recommend a catheter and surgical consultation for a new access.  Original Report Authenticated By: Richarda Overlie, M.D.   Dg Chest Port 1 View  08/16/2011  *RADIOLOGY REPORT*  Clinical Data: Pneumonia.  Pleural effusion.  PORTABLE CHEST - 1 VIEW  Comparison:  08/15/2011  Findings: Nasogastric tube extends into the stomach.  Cardiomegaly noted with mild interstitial accentuation and increased obscuration of the right hemidiaphragm primarily ascribed to pleural effusion and associated passive atelectasis.  There is mild atelectasis at the left lung base along with a small left pleural effusion.  Lung volumes are slightly worsened compared to yesterday's exam.  Atherosclerotic calcification of the aortic arch noted. Glenohumeral degenerative findings noted.  IMPRESSION:  1.  Increase in apparent size of the right pleural effusion with associated passive atelectasis. 2.  Small left pleural effusion with left basilar atelectasis also noted. 3.  Cardiomegaly with minimal interstitial accentuation.  Original Report Authenticated By: Dellia Cloud, M.D.   Dg Chest Port 1 View  08/15/2011  *RADIOLOGY REPORT*  Clinical Data: Status post right thoracentesis.  PORTABLE CHEST - 1 VIEW  Comparison: Earlier the same day  Findings: 1556 hours.  Right pleural effusion has decreased in the interval.  No evidence for pneumothorax.  There is some subsegmental atelectasis at the right base. The NG tube passes into the stomach although the distal tip position is not included on the film. The cardiopericardial silhouette is enlarged.  Diffuse interstitial opacity is improved in the interval, suggesting resolving edema.  IMPRESSION: No evidence for pneumothorax after right thoracentesis.  Original Report Authenticated By: ERIC A. MANSELL, M.D.   Dg Chest Port 1 View  08/15/2011  *RADIOLOGY REPORT*  Clinical Data: Shortness of breath and pleural effusion.  PORTABLE CHEST - 1 VIEW  Comparison: 08/14/2011  Findings: The NG tube passes into the stomach although the distal tip position is not included on the film. The cardiopericardial silhouette is enlarged.  Vascular congestion with diffuse interstitial opacity is consistent with edema.  There is bibasilar atelectasis and right  pleural effusion.  IMPRESSION: No substantial change.  Cardiomegaly with edema and right pleural effusion.  Original Report Authenticated By: ERIC A. MANSELL, M.D.   Portable Chest Xray In Am  08/14/2011  *RADIOLOGY REPORT*  Clinical Data: Respiratory failure  PORTABLE CHEST - 1 VIEW  Comparison: Aug 13, 2011  Findings: There is cardiomegaly and pulmonary edema, as well as a moderate sized right pleural effusion.  Overall, the findings  have not significantly changed.  The nasogastric tube tip courses off the inferior film.  IMPRESSION: No significant change in cardiomegaly, pulmonary edema, moderate sized right pleural effusion.  Original Report Authenticated By: Brandon Melnick, M.D.   Dg Chest Port 1 View  08/13/2011  *RADIOLOGY REPORT*  Clinical Data: Unresponsive.  Shortness of breath and abdominal distention.  PORTABLE CHEST - 1 VIEW  Comparison: 07/31/2011.  Findings: Trachea is midline.  Heart size stable.  There is bilateral air space disease, right greater than left, with progression from 07/31/2011.  Associated right pleural effusion. There may be a tiny left pleural effusion.  IMPRESSION: Worsening bilateral air space disease with an associated right pleural effusion.  Question tiny left pleural effusion.  Congestive heart failure is likely.  Original Report Authenticated By: Reyes Ivan, M.D.   Dg Abd Portable 1v  08/16/2011  *RADIOLOGY REPORT*  Clinical Data: Small bowel  obstruction  PORTABLE ABDOMEN - 1 VIEW  Comparison: 08/15/2011  Findings: Vascular calcifications and rim calcified left renal lesions noted.  Nasogastric tube extends into the stomach.  The right femoral line projects over the pelvis.  Left pelvic skin clips noted.  Currently most of the bowel is gasless.  The visualized loops of bowel do not demonstrate bowel dilatation.  Lumbar spondylosis is again noted. Pleural effusions noted at the lung bases, with cardiomegaly.  IMPRESSION:  1.  No dilated bowel is currently  visible, although much of the bowel is gasless. 2.  Small right and smaller left pleural effusions. 3.  Calcified left renal cysts or masses. 4.  Vascular calcifications. 5.  Right femoral line tip projects over the right sacral ala.  Original Report Authenticated By: Dellia Cloud, M.D.   Dg Abd Portable 1v  08/15/2011  *RADIOLOGY REPORT*  Clinical Data: Small bowel obstruction.  PORTABLE ABDOMEN - 1 VIEW  Comparison: Plain films and CT abdomen and pelvis 08/13/2011.  Findings: New NG tube and right femoral catheter are in place. Surgical staples to the left of midline again noted.  Gas filled small bowel loops are again seen but appear less numerous and prominent.  Bilateral upper quadrant calcifications are noted and consistent with renal calcifications seen on CT.  IMPRESSION: Mild improvement small bowel obstruction.  Original Report Authenticated By: Bernadene Bell. Maricela Curet, M.D.   Dg Abd Portable 1v  08/13/2011  *RADIOLOGY REPORT*  Clinical Data: Unresponsive.  Shortness of breath and abdominal distention.  PORTABLE ABDOMEN - 1 VIEW  Comparison: None.  Findings: There is mild gaseous distention of small bowel loops. Some colonic gas and stool are seen.  Surgical skin staples overlie the left lower quadrant.  Peripherally calcified structure in the left paraspinal region has the shape of the left kidney.  Additional tubular calcifications are seen in the left upper quadrant.  There may be small calcifications in the right paraspinal region.  IMPRESSION:  1.  Mild small bowel distention with possible wall thickening.  CT abdomen pelvis with contrast would likely be helpful in further evaluation, as clinically indicated. 2.  Calcified structures in the left upper quadrant, and likely right paraspinal region, as discussed above.  These could also be further assessed with CT.  Original Report Authenticated By: Reyes Ivan, M.D.   Medications: Scheduled Meds:    . aspirin EC  81 mg Oral Daily    . ceFEPime (MAXIPIME) IV  1 g Intravenous Q24H  . darbepoetin (ARANESP) injection - DIALYSIS  60 mcg Intravenous Q Mon-HD  . famotidine  20 mg Oral QHS  . ferric gluconate (FERRLECIT/NULECIT) IV  125 mg Intravenous Q M,W,F-HD  . metoprolol tartrate  12.5 mg Oral BID  . metroNIDAZOLE  500 mg Oral Q8H  . sodium chloride  25 mL/kg Intravenous Once  . vancomycin  250 mg Oral Q6H  . vancomycin  500 mg Intravenous To Hemo  . DISCONTD: metoprolol tartrate  12.5 mg Oral BID  . DISCONTD: vancomycin  750 mg Intravenous Q M,W,F-HD  . DISCONTD: vancomycin  750 mg Intravenous Q Mon-1800  . DISCONTD: vancomycin  1,000 mg Intravenous Once   Continuous Infusions:  PRN Meds:.acetaminophen, heparin, metoprolol  ASSESSMENT & PLAN: Active Problems:  ESRD (end stage renal disease)  Hypertension  End stage renal disease  Peripheral vascular disease, unspecified  Pleural effusion  SBO (small bowel obstruction)  Pneumonia  C. difficile colitis  Atrial fibrillation with RVR  Atrial flutter  CAD (coronary artery  disease)   Sepsis -Patient admitted with septic shock to the ICU with the need of support of vasopressors. -Weaned quickly off of the vasopressors, his ejection fraction about 50% with inferior/septal hypokinesis. -Likely secondary to pneumonia and C. difficile colitis, resolved now.  C. difficile colitis -Positive C. difficile PCR. Patient is on Flagyl, oral vancomycin added yesterday. -Patient has slightly distended abdomen, KUB was done no air-fluid level or ileus.  -Patient did have bowel movement on the 27th -Patient also on IV vancomycin and cefepime for pneumonia.  End stage renal disease -On hemodialysis, appreciate nephrology help.  -He had his dialysis catheter placed yesterday by interventional radiology on dialysis restarted.  Atrial fibrillation/flutter with RVR -This is new onset abdomen the ICU with a blood pressure was low. -Currently sinus rhythm, and his rate is  controlled. -Initially Coumadin recommended, but final recommendation from cardiology is to start aspirin. -Patient to discuss the benefits of anticoagulations with his primary care physician.  Hypertension -Controlled, continue preadmission medications.  Pleural effusion -Pleural effusion was tapped while patient in the ICU. -According to the LDH and this is his transudative process likely secondary to his renal/heart disease. -Pulmonary was following, recommended no further management  Disposition -Patient dialysis restarted, likely to be discharged in the morning if it's okay with nephrology -Had suicidal ideation per nursing staff, I am not sure if he is serious. He is confused and disoriented. -Anyway psychiatry was consulted yesterday to evaluate home.    LOS: 8 days   Ninetta Adelstein A 08/21/2011, 9:52 AM

## 2011-08-21 NOTE — Progress Notes (Signed)
PT Cancellation Note    Treatment cancelled today due to patient's refusal to participate  08/21/2011  [facility], PT [phone] (pager)

## 2011-08-21 NOTE — Progress Notes (Signed)
[facility] ROUNDING NOTE   Subjective:   Interval History: none.  Objective:  Vital signs in last 24 hours:  Temp:  [97.8 F (36.6 C)-98.8 F (37.1 C)] 97.8 F (36.6 C) (05/30 0400) Pulse Rate:  [61-104] 98  (05/30 0500) Resp:  [16-20] 18  (05/30 0500) BP: (96-149)/(32-78) 123/69 mmHg (05/30 0500) SpO2:  [94 %-98 %] 95 % (05/30 0500) Weight:  [68.9 kg (151 lb 14.4 oz)] 68.9 kg (151 lb 14.4 oz) (05/30 0000)  Weight change: 2.9 kg (6 lb 6.3 oz) Filed Weights   08/18/11 2103 08/19/11 2023 08/21/11 0000  Weight: 66 kg (145 lb 8.1 oz) 66 kg (145 lb 8.1 oz) 68.9 kg (151 lb 14.4 oz)    Intake/Output: I/O last 3 completed shifts: In: 960 [P.O.:960] Out: 97 [Other:97]   Intake/Output this shift:     CVS- RRR RS- CTA ABD- BS present soft non-distended EXT- no edema   Basic Metabolic Panel:  Lab 08/17/11 1610 08/16/11 1358 08/16/11 0430 08/15/11 0430  NA 141 141 143 142  K 4.0 4.1 4.1 4.2  CL 101 100 102 100  CO2 25 23 22 25   GLUCOSE 118* 133* 141* 97  BUN 54* 71* 69* 56*  CREATININE 6.77* 7.77* 7.27* 5.96*  CALCIUM 9.3 9.2 9.2 --  MG -- -- 2.0 --  PHOS 4.4 5.4* 5.8* 6.1*    Liver Function Tests:  Lab 08/17/11 0844 08/16/11 1358 08/16/11 0430 08/15/11 0430  AST -- -- 19 --  ALT -- -- 10 --  ALKPHOS -- -- 96 --  BILITOT -- -- 0.3 --  PROT -- -- 5.0* --  ALBUMIN 2.2* 2.0* 1.9* 2.0*   No results found for this basename: LIPASE:5,AMYLASE:5 in the last 168 hours No results found for this basename: AMMONIA:3 in the last 168 hours  CBC:  Lab 08/19/11 1200 08/17/11 0844 08/16/11 1358 08/16/11 0430 08/15/11 0430  WBC 23.9* 32.2* 25.6* 21.8* 14.2*  NEUTROABS -- -- -- -- --  HGB 8.1* 8.6* 9.3* 8.6* 8.3*  HCT 25.5* 27.2* 28.8* 27.2* 25.8*  MCV 88.2 87.5 87.8 87.2 86.0  PLT 237 225 197 247 270    Cardiac Enzymes:  Lab 08/14/11 1505  CKTOTAL 254*  CKMB 4.4*  CKMBINDEX --  TROPONINI <0.30    BNP: No components found with this basename:  POCBNP:5  CBG:  Lab 08/16/11 1742 08/16/11 1151 08/16/11 0722 08/16/11 0510 08/16/11 0350  GLUCAP 77 100* 117* 102* 68*    Microbiology: Results for orders placed during the hospital encounter of 08/13/11  CULTURE, BLOOD (ROUTINE X 2)     Status: Normal   Collection Time   08/13/11  3:15 PM      Component Value Range Status Comment   Specimen Description BLOOD CENTRAL LINE   Final    Special Requests BOTTLES DRAWN AEROBIC AND ANAEROBIC   Final    Culture  Setup Time [id number]   Final    Culture     Final    Value: DIPHTHEROIDS(CORYNEBACTERIUM SPECIES)     Note: Standardized susceptibility testing for this organism is not available.     Note: Gram Stain Report Called to,Read Back By and Verified With: GENIVEVE WROBLEWSKI @1150  08/15/11 BY KRAWS   Report Status 08/16/2011 FINAL   Final   CULTURE, BLOOD (ROUTINE X 2)     Status: Normal   Collection Time   08/13/11  3:50 PM      Component Value Range Status Comment   Specimen Description BLOOD  HAND RIGHT   Final    Special Requests BOTTLES DRAWN AEROBIC ONLY 5CC   Final    Culture  Setup Time [id number]   Final    Culture NO GROWTH 5 DAYS   Final    Report Status 08/20/2011 FINAL   Final   MRSA PCR SCREENING     Status: Normal   Collection Time   08/13/11  6:50 PM      Component Value Range Status Comment   MRSA by PCR NEGATIVE  NEGATIVE  Final   CLOSTRIDIUM DIFFICILE BY PCR     Status: Abnormal   Collection Time   08/13/11 10:46 PM      Component Value Range Status Comment   C difficile by pcr POSITIVE (*) NEGATIVE  Final   BODY FLUID CULTURE     Status: Normal   Collection Time   08/15/11  5:58 PM      Component Value Range Status Comment   Specimen Description PLEURAL FLUID RIGHT   Final    Special Requests NONE   Final    Gram Stain     Final    Value: RARE WBC PRESENT,BOTH PMN AND MONONUCLEAR     NO ORGANISMS SEEN   Culture NO GROWTH 3 DAYS   Final    Report Status 08/19/2011 FINAL   Final     Coagulation  Studies:  Basename 08/19/11 1200  LABPROT 18.8*  INR 1.54*    Urinalysis: No results found for this basename: COLORURINE:2,APPERANCEUR:2,LABSPEC:2,PHURINE:2,GLUCOSEU:2,HGBUR:2,BILIRUBINUR:2,KETONESUR:2,PROTEINUR:2,UROBILINOGEN:2,NITRITE:2,LEUKOCYTESUR:2 in the last 72 hours    Imaging: Dg Abd 1 View  08/20/2011  *RADIOLOGY REPORT*  Clinical Data: Abdominal distention.  ABDOMEN - 1 VIEW  Comparison: Plain film the abdomen 08/16/2011 and CT abdomen and pelvis 08/13/2011.  Findings: There are some scattered gas filled but nondilated loops of small bowel.  Small amount of gas is seen in the ascending colon.  NG tube is no longer visualized.  Calcified cystic lesion in the left upper quadrant as seen on CT noted.  Atherosclerotic vascular disease is also noted.  IMPRESSION: Some increase in gas filled but nondilated loops of small bowel could be due to progressive small bowel obstruction.  Original Report Authenticated By: Bernadene Bell. Maricela Curet, M.D.   Ir Fluoro Guide Cv Line Left  08/20/2011  *RADIOLOGY REPORT*  Clinical data: End-stage renal disease.  Occluded arm graft.  Needs access for hemodialysis.  TUNNELED HEMODIALYSIS CATHETER PLACEMENT WITH ULTRASOUND AND FLUOROSCOPIC GUIDANCE:  Comparison: None  Technique: The procedure, risks, benefits, and alternatives were explained to the patient.  Questions regarding the procedure were encouraged and answered.  The patient understands and consents to the procedure. The patient was on adequate   antibiotic coverage, so no additional preprocedural antibiotics were administered. Ultrasound suggested occlusion of the right IJ vein at the thoracic inlet.    Patency of the left IJ vein was confirmed with ultrasound with image documentation. An appropriate skin site was determined. Region was prepped using maximum barrier technique including cap and mask, sterile gown, sterile gloves, large sterile sheet, and Chlorhexidine   as cutaneous antisepsis. The region was  infiltrated locally with 1% lidocaine. Under real-time ultrasound guidance, the left IJ vein was accessed with a 21 gauge micropuncture needle; the needle tip within the vein was confirmed with ultrasound image documentation.   Needle exchanged over the 018 guidewire for transitional dilator, which allowed advancement of a Benson wire into the IVC. Over this, an MPA catheter was advanced. A Hemosplit 23 hemodialysis catheter was  tunneled from the right anterior chest wall approach to the left IJ dermatotomy site. The MPA catheter was exchanged over an Amplatz wire for serial vascular dilators which allow placement of a peel-away sheath, through which the catheter was advanced under intermittent fluoroscopy, positioned with its tips in the proximal and midright atrium. Spot chest radiograph confirms good catheter position. No pneumothorax. Catheter was flushed and primed per protocol. Catheter secured externally with O Prolene sutures. The left IJ   dermatotomy site was closed with Dermabond. No immediate complication.  IMPRESSION:  1. Technically successful placement of tunneled left IJ hemodialysis catheter with ultrasound and fluoroscopic guidance. Ready for routine use.  Access management: Remains approachable for percutaneous intervention as needed.  Original Report Authenticated By: Osa Craver, M.D.   Ir US Guide Vasc Access Left  08/20/2011  *RADIOLOGY REPORT*  Clinical data: End-stage renal disease.  Occluded arm graft.  Needs access for hemodialysis.  TUNNELED HEMODIALYSIS CATHETER PLACEMENT WITH ULTRASOUND AND FLUOROSCOPIC GUIDANCE:  Comparison: None  Technique: The procedure, risks, benefits, and alternatives were explained to the patient.  Questions regarding the procedure were encouraged and answered.  The patient understands and consents to the procedure. The patient was on adequate   antibiotic coverage, so no additional preprocedural antibiotics were administered. Ultrasound suggested  occlusion of the right IJ vein at the thoracic inlet.    Patency of the left IJ vein was confirmed with ultrasound with image documentation. An appropriate skin site was determined. Region was prepped using maximum barrier technique including cap and mask, sterile gown, sterile gloves, large sterile sheet, and Chlorhexidine   as cutaneous antisepsis. The region was infiltrated locally with 1% lidocaine. Under real-time ultrasound guidance, the left IJ vein was accessed with a 21 gauge micropuncture needle; the needle tip within the vein was confirmed with ultrasound image documentation.   Needle exchanged over the 018 guidewire for transitional dilator, which allowed advancement of a Benson wire into the IVC. Over this, an MPA catheter was advanced. A Hemosplit 23 hemodialysis catheter was tunneled from the right anterior chest wall approach to the left IJ dermatotomy site. The MPA catheter was exchanged over an Amplatz wire for serial vascular dilators which allow placement of a peel-away sheath, through which the catheter was advanced under intermittent fluoroscopy, positioned with its tips in the proximal and midright atrium. Spot chest radiograph confirms good catheter position. No pneumothorax. Catheter was flushed and primed per protocol. Catheter secured externally with O Prolene sutures. The left IJ   dermatotomy site was closed with Dermabond. No immediate complication.  IMPRESSION:  1. Technically successful placement of tunneled left IJ hemodialysis catheter with ultrasound and fluoroscopic guidance. Ready for routine use.  Access management: Remains approachable for percutaneous intervention as needed.  Original Report Authenticated By: Osa Craver, M.D.     Medications:        . aspirin EC  81 mg Oral Daily  . ceFEPime (MAXIPIME) IV  1 g Intravenous Q24H  . darbepoetin (ARANESP) injection - DIALYSIS  60 mcg Intravenous Q Mon-HD  . famotidine  20 mg Oral QHS  . ferric gluconate  (FERRLECIT/NULECIT) IV  125 mg Intravenous Q M,W,F-HD  . metoprolol tartrate  12.5 mg Oral BID  . metroNIDAZOLE  500 mg Oral Q8H  . sodium chloride  25 mL/kg Intravenous Once  . vancomycin  250 mg Oral Q6H  . vancomycin  500 mg Intravenous To Hemo  . DISCONTD: metoprolol tartrate  12.5 mg Oral BID  .  DISCONTD: vancomycin  750 mg Intravenous Q M,W,F-HD  . DISCONTD: vancomycin  750 mg Intravenous Q Mon-1800  . DISCONTD: vancomycin  1,000 mg Intravenous Once   acetaminophen, heparin, metoprolol  Assessment/ Plan:  1. ESRD / usual MWF [place]- L upper arm AVF is clotted.temporary dialysis catheter. Will need [facility] consult for new AVF 2. GI- stable 3. Leukocytosis-CDiff and iscemic limb appears chronic no overt infection 4. Lytic lesion right pelvis and abnormal appearance on CT of both kidneys - will need w/u eventually 5. Peripheral Artery Disease - keep eye on left toe 6. Anemia - no idea of baseline Hb and cannot get info from HD unit. 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 AFib/flutter - per primary 9. CDiff positive-on vanco and flagyl Received dialysis yesterday    LOS: 8 [name] W @TODAY @9 :14 AM

## 2011-08-22 ENCOUNTER — Inpatient Hospital Stay (HOSPITAL_COMMUNITY): Payer: Medicare Other

## 2011-08-22 DIAGNOSIS — N186 End stage renal disease: Secondary | ICD-10-CM

## 2011-08-22 DIAGNOSIS — A413 Sepsis due to Hemophilus influenzae: Secondary | ICD-10-CM

## 2011-08-22 DIAGNOSIS — A0472 Enterocolitis due to Clostridium difficile, not specified as recurrent: Secondary | ICD-10-CM

## 2011-08-22 DIAGNOSIS — F329 Major depressive disorder, single episode, unspecified: Secondary | ICD-10-CM

## 2011-08-22 DIAGNOSIS — F3289 Other specified depressive episodes: Secondary | ICD-10-CM

## 2011-08-22 DIAGNOSIS — F0390 Unspecified dementia without behavioral disturbance: Secondary | ICD-10-CM

## 2011-08-22 LAB — RENAL FUNCTION PANEL
Albumin: 1.9 g/dL — ABNORMAL LOW (ref 3.5–5.2)
Chloride: 96 mEq/L (ref 96–112)
GFR calc Af Amer: 11 mL/min — ABNORMAL LOW (ref >90)
Glucose, Bld: 109 mg/dL — ABNORMAL HIGH (ref 70–99)
Phosphorus: 5.3 mg/dL — ABNORMAL HIGH (ref 2.3–4.6)
Potassium: 3.2 mEq/L — ABNORMAL LOW (ref 3.5–5.1)
Sodium: 135 mEq/L (ref 135–145)

## 2011-08-22 LAB — CBC
HCT: 22.5 % — ABNORMAL LOW (ref 39.0–52.0)
Hemoglobin: 7.2 g/dL — ABNORMAL LOW (ref 13.0–17.0)
MCHC: 32 g/dL (ref 30.0–36.0)
RDW: 18.9 % — ABNORMAL HIGH (ref 11.5–15.5)
WBC: 15.1 10*3/uL — ABNORMAL HIGH (ref 4.0–10.5)

## 2011-08-22 NOTE — Progress Notes (Signed)
ANTIBIOTIC CONSULT NOTE - FOLLOW UP  Pharmacy Consult for Vancomycin + Cefepime Indication: Empiric treatment for PNA/sepsis  No Known Allergies  Patient Measurements: Height: 5\' 6"  (167.6 cm) Weight: 151 lb 3.8 oz (68.6 kg) IBW/kg (Calculated) : 63.8   Vital Signs: Temp: 97.3 F (36.3 C) (05/31 0608) Temp src: Oral (05/31 0608) BP: 133/61 mmHg (05/31 0730) Pulse Rate: 105  (05/31 0730) Intake/Output from previous day: 05/30 0701 - 05/31 0700 In: 660 [P.O.:560] Out: 1 [Stool:1] Intake/Output from this shift:    Labs:  Basename 08/22/11 0629 08/19/11 1200  WBC 15.1* 23.9*  HGB 7.2* 8.1*  PLT 206 237  LABCREA -- --  CREATININE -- --   Estimated Creatinine Clearance: 10.2 ml/min (by C-G formula based on Cr of 6.77). No results found for this basename: VANCOTROUGH:2,VANCOPEAK:2,VANCORANDOM:2,GENTTROUGH:2,GENTPEAK:2,GENTRANDOM:2,TOBRATROUGH:2,TOBRAPEAK:2,TOBRARND:2,AMIKACINPEAK:2,AMIKACINTROU:2,AMIKACIN:2, in the last 72 hours   Microbiology: Recent Results (from the past 720 hour(s))  CULTURE, BLOOD (ROUTINE X 2)     Status: Normal   Collection Time   08/13/11  3:15 PM      Component Value Range Status Comment   Specimen Description BLOOD CENTRAL LINE   Final    Special Requests BOTTLES DRAWN AEROBIC AND ANAEROBIC   Final    Culture  Setup Time [id number]   Final    Culture     Final    Value: DIPHTHEROIDS(CORYNEBACTERIUM SPECIES)     Note: Standardized susceptibility testing for this organism is not available.     Note: Gram Stain Report Called to,Read Back By and Verified With: GENIVEVE WROBLEWSKI @1150  08/15/11 BY KRAWS   Report Status 08/16/2011 FINAL   Final   CULTURE, BLOOD (ROUTINE X 2)     Status: Normal   Collection Time   08/13/11  3:50 PM      Component Value Range Status Comment   Specimen Description BLOOD HAND RIGHT   Final    Special Requests BOTTLES DRAWN AEROBIC ONLY 5CC   Final    Culture  Setup Time [id number]   Final    Culture NO GROWTH  5 DAYS   Final    Report Status 08/20/2011 FINAL   Final   MRSA PCR SCREENING     Status: Normal   Collection Time   08/13/11  6:50 PM      Component Value Range Status Comment   MRSA by PCR NEGATIVE  NEGATIVE  Final   CLOSTRIDIUM DIFFICILE BY PCR     Status: Abnormal   Collection Time   08/13/11 10:46 PM      Component Value Range Status Comment   C difficile by pcr POSITIVE (*) NEGATIVE  Final   BODY FLUID CULTURE     Status: Normal   Collection Time   08/15/11  5:58 PM      Component Value Range Status Comment   Specimen Description PLEURAL FLUID RIGHT   Final    Special Requests NONE   Final    Gram Stain     Final    Value: RARE WBC PRESENT,BOTH PMN AND MONONUCLEAR     NO ORGANISMS SEEN   Culture NO GROWTH 3 DAYS   Final    Report Status 08/19/2011 FINAL   Final     Anti-infectives     Start     Dose/Rate Route Frequency Ordered Stop   08/25/11 1800   vancomycin (VANCOCIN) 750 mg in sodium chloride 0.9 % 150 mL IVPB  Status:  Discontinued        750 mg 150  mL/hr over 60 Minutes Intravenous Every Mon (1800) 08/20/11 1035 08/20/11 1046   08/21/11 0215   vancomycin (VANCOCIN) 500 mg in sodium chloride 0.9 % 100 mL IVPB        500 mg 100 mL/hr over 60 Minutes Intravenous To Hemodialysis 08/21/11 0213 08/21/11 0400   08/19/11 1000   vancomycin (VANCOCIN) 50 mg/mL oral solution 250 mg        250 mg Oral 4 times per day 08/19/11 0843     08/18/11 1200   vancomycin (VANCOCIN) 750 mg in sodium chloride 0.9 % 150 mL IVPB  Status:  Discontinued        750 mg 150 mL/hr over 60 Minutes Intravenous Every M-W-F (Hemodialysis) 08/18/11 1006 08/20/11 1030   08/16/11 2200   vancomycin (VANCOCIN) 750 mg in sodium chloride 0.9 % 150 mL IVPB        750 mg 150 mL/hr over 60 Minutes Intravenous  Once 08/16/11 1046 08/16/11 2342   08/16/11 1100   metroNIDAZOLE (FLAGYL) tablet 500 mg        500 mg Oral 3 times per day 08/16/11 0943     08/14/11 1200   vancomycin (VANCOCIN) 750 mg in sodium  chloride 0.9 % 150 mL IVPB        750 mg 150 mL/hr over 60 Minutes Intravenous  Once 08/14/11 0837 08/14/11 1439   08/14/11 0200   metroNIDAZOLE (FLAGYL) IVPB 500 mg  Status:  Discontinued        500 mg 100 mL/hr over 60 Minutes Intravenous Every 8 hours 08/13/11 1738 08/16/11 0943   08/13/11 1800   ceFEPIme (MAXIPIME) 1 g in dextrose 5 % 50 mL IVPB        1 g 100 mL/hr over 30 Minutes Intravenous Every 24 hours 08/13/11 1738     08/13/11 1745   vancomycin (VANCOCIN) 500 mg in sodium chloride 0.9 % 100 mL IVPB        500 mg 100 mL/hr over 60 Minutes Intravenous NOW 08/13/11 1738 08/14/11 1745   08/13/11 1700   vancomycin (VANCOCIN) IVPB 1000 mg/200 mL premix  Status:  Discontinued        1,000 mg 200 mL/hr over 60 Minutes Intravenous  Once 08/13/11 1656 08/20/11 1031   08/13/11 1700   ceFEPIme (MAXIPIME) 2 g in dextrose 5 % 50 mL IVPB  Status:  Discontinued        2 g 100 mL/hr over 30 Minutes Intravenous  Once 08/13/11 1656 08/13/11 1741   08/13/11 1700   metroNIDAZOLE (FLAGYL) IVPB 500 mg        500 mg 100 mL/hr over 60 Minutes Intravenous  Once 08/13/11 1656 08/13/11 1839   08/13/11 1545   vancomycin (VANCOCIN) IVPB 1000 mg/200 mL premix        1,000 mg 200 mL/hr over 60 Minutes Intravenous  Once 08/13/11 1536 08/13/11 1700   08/13/11 1530  piperacillin-tazobactam (ZOSYN) IVPB 3.375 g       3.375 g 12.5 mL/hr over 240 Minutes Intravenous  Once 08/13/11 1527 08/13/11 1956          Assessment: 61 y.o. M with ESRD on Vancomycin + Cefepime D#10 (5/22 >> current) for empiric PNA/sepsis coverage. The patient was loaded with Vancomycin on 5/22. He received HD sessions on 5/23, 5/25, and 5/30 and Vancomycin maintenance doses on 5/23, 5/25, 5/27, 5/30. Due to the extra dose given on 5/27 -- the patient will not need an additional maintenance dose after his HD session  today.   Antibiotic LOT was discussed with Dr. Arthor Captain today. Per his count, today is D#9 and he would like to  continue antibiotic treatment for at least 10 days. With a concurrent CDiff infection -- it is important to limit broad spectrum antibiotic use when able.   Goal of Therapy:  Pre-HD Vancomycin level of 15-25 mcg/ml  Plan:  1. No Vancomycin needed after HD session today 2. If Vancomycin continues -- will check a pre-HD level prior to next session to access further doses 3. Continue Cefepime 1g IV every 24 hours 4. Will continue to follow HD schedule/duration, culture results, LOT, and antibiotic de-escalation plans   Georgina Pillion, PharmD, BCPS Clinical Pharmacist Pager: [phone] 08/22/2011 8:39 AM

## 2011-08-22 NOTE — Procedures (Signed)
I have seen and examined this patient and agree with the plan of care seen on dialysis  [facility] W 08/22/2011, 9:29 AM

## 2011-08-22 NOTE — Progress Notes (Signed)
[facility] ROUNDING NOTE   Subjective:   Interval History: none.  Objective:  Vital signs in last 24 hours:  Temp:  [97.3 F (36.3 C)-99 F (37.2 C)] 97.3 F (36.3 C) (05/31 0608) Pulse Rate:  [92-109] 109  (05/31 0840) Resp:  [16-21] 17  (05/31 0840) BP: (84-148)/(47-80) 129/63 mmHg (05/31 0840) SpO2:  [96 %-98 %] 97 % (05/31 0608) Weight:  [67.7 kg (149 lb 4 oz)-68.6 kg (151 lb 3.8 oz)] 68.6 kg (151 lb 3.8 oz) (05/31 1610)  Weight change: -1.2 kg (-2 lb 10.3 oz) Filed Weights   08/21/11 0000 08/21/11 2029 08/22/11 0608  Weight: 68.9 kg (151 lb 14.4 oz) 67.7 kg (149 lb 4 oz) 68.6 kg (151 lb 3.8 oz)    Intake/Output: I/O last 3 completed shifts: In: 660 [P.O.:560; Other:100] Out: 71 [Other:97; Stool:1]   Intake/Output this shift:     CVS- RRR RS- CTA ABD- BS present soft non-distended EXT- no edema   Basic Metabolic Panel:  Lab 08/22/11 9604 08/17/11 0844 08/16/11 1358 08/16/11 0430  NA 135 141 141 143  K 3.2* 4.0 4.1 4.1  CL 96 101 100 102  CO2 24 25 23 22   GLUCOSE 109* 118* 133* 141*  BUN 33* 54* 71* 69*  CREATININE 5.87* 6.77* 7.77* 7.27*  CALCIUM 9.0 9.3 9.2 --  MG -- -- -- 2.0  PHOS 5.3* 4.4 5.4* 5.8*    Liver Function Tests:  Lab 08/22/11 0615 08/17/11 0844 08/16/11 1358 08/16/11 0430  AST -- -- -- 19  ALT -- -- -- 10  ALKPHOS -- -- -- 96  BILITOT -- -- -- 0.3  PROT -- -- -- 5.0*  ALBUMIN 1.9* 2.2* 2.0* 1.9*   No results found for this basename: LIPASE:5,AMYLASE:5 in the last 168 hours No results found for this basename: AMMONIA:3 in the last 168 hours  CBC:  Lab 08/22/11 0629 08/19/11 1200 08/17/11 0844 08/16/11 1358 08/16/11 0430  WBC 15.1* 23.9* 32.2* 25.6* 21.8*  NEUTROABS -- -- -- -- --  HGB 7.2* 8.1* 8.6* 9.3* 8.6*  HCT 22.5* 25.5* 27.2* 28.8* 27.2*  MCV 89.6 88.2 87.5 87.8 87.2  PLT 206 237 225 197 247    Cardiac Enzymes: No results found for this basename: CKTOTAL:5,CKMB:5,CKMBINDEX:5,TROPONINI:5 in the last  168 hours  BNP: No components found with this basename: POCBNP:5  CBG:  Lab 08/16/11 1742 08/16/11 1151 08/16/11 0722 08/16/11 0510 08/16/11 0350  GLUCAP 77 100* 117* 102* 68*    Microbiology: Results for orders placed during the hospital encounter of 08/13/11  CULTURE, BLOOD (ROUTINE X 2)     Status: Normal   Collection Time   08/13/11  3:15 PM      Component Value Range Status Comment   Specimen Description BLOOD CENTRAL LINE   Final    Special Requests BOTTLES DRAWN AEROBIC AND ANAEROBIC   Final    Culture  Setup Time [id number]   Final    Culture     Final    Value: DIPHTHEROIDS(CORYNEBACTERIUM SPECIES)     Note: Standardized susceptibility testing for this organism is not available.     Note: Gram Stain Report Called to,Read Back By and Verified With: GENIVEVE WROBLEWSKI @1150  08/15/11 BY KRAWS   Report Status 08/16/2011 FINAL   Final   CULTURE, BLOOD (ROUTINE X 2)     Status: Normal   Collection Time   08/13/11  3:50 PM      Component Value Range Status Comment   Specimen Description  BLOOD HAND RIGHT   Final    Special Requests BOTTLES DRAWN AEROBIC ONLY [facility]   Final    Culture  Setup Time [id number]   Final    Culture NO GROWTH 5 DAYS   Final    Report Status 08/20/2011 FINAL   Final   MRSA PCR SCREENING     Status: Normal   Collection Time   08/13/11  6:50 PM      Component Value Range Status Comment   MRSA by PCR NEGATIVE  NEGATIVE  Final   CLOSTRIDIUM DIFFICILE BY PCR     Status: Abnormal   Collection Time   08/13/11 10:46 PM      Component Value Range Status Comment   C difficile by pcr POSITIVE (*) NEGATIVE  Final   BODY FLUID CULTURE     Status: Normal   Collection Time   08/15/11  5:58 PM      Component Value Range Status Comment   Specimen Description PLEURAL FLUID RIGHT   Final    Special Requests NONE   Final    Gram Stain     Final    Value: RARE WBC PRESENT,BOTH PMN AND MONONUCLEAR     NO ORGANISMS SEEN   Culture NO GROWTH 3 DAYS   Final     Report Status 08/19/2011 FINAL   Final     Coagulation Studies:  Basename 08/19/11 1200  LABPROT 18.8*  INR 1.54*    Urinalysis: No results found for this basename: COLORURINE:2,APPERANCEUR:2,LABSPEC:2,PHURINE:2,GLUCOSEU:2,HGBUR:2,BILIRUBINUR:2,KETONESUR:2,PROTEINUR:2,UROBILINOGEN:2,NITRITE:2,LEUKOCYTESUR:2 in the last 72 hours    Imaging: Dg Abd 1 View  08/20/2011  *RADIOLOGY REPORT*  Clinical Data: Abdominal distention.  ABDOMEN - 1 VIEW  Comparison: Plain film the abdomen 08/16/2011 and CT abdomen and pelvis 08/13/2011.  Findings: There are some scattered gas filled but nondilated loops of small bowel.  Small amount of gas is seen in the ascending colon.  NG tube is no longer visualized.  Calcified cystic lesion in the left upper quadrant as seen on CT noted.  Atherosclerotic vascular disease is also noted.  IMPRESSION: Some increase in gas filled but nondilated loops of small bowel could be due to progressive small bowel obstruction.  Original Report Authenticated By: Bernadene Bell. Maricela Curet, M.D.   Ir Fluoro Guide Cv Line Left  08/20/2011  *RADIOLOGY REPORT*  Clinical data: End-stage renal disease.  Occluded arm graft.  Needs access for hemodialysis.  TUNNELED HEMODIALYSIS CATHETER PLACEMENT WITH ULTRASOUND AND FLUOROSCOPIC GUIDANCE:  Comparison: None  Technique: The procedure, risks, benefits, and alternatives were explained to the patient.  Questions regarding the procedure were encouraged and answered.  The patient understands and consents to the procedure. The patient was on adequate   antibiotic coverage, so no additional preprocedural antibiotics were administered. Ultrasound suggested occlusion of the right IJ vein at the thoracic inlet.    Patency of the left IJ vein was confirmed with ultrasound with image documentation. An appropriate skin site was determined. Region was prepped using maximum barrier technique including cap and mask, sterile gown, sterile gloves, large sterile sheet,  and Chlorhexidine   as cutaneous antisepsis. The region was infiltrated locally with 1% lidocaine. Under real-time ultrasound guidance, the left IJ vein was accessed with a 21 gauge micropuncture needle; the needle tip within the vein was confirmed with ultrasound image documentation.   Needle exchanged over the 018 guidewire for transitional dilator, which allowed advancement of a Benson wire into the IVC. Over this, an MPA catheter was advanced. A Hemosplit 23 hemodialysis catheter  was tunneled from the right anterior chest wall approach to the left IJ dermatotomy site. The MPA catheter was exchanged over an Amplatz wire for serial vascular dilators which allow placement of a peel-away sheath, through which the catheter was advanced under intermittent fluoroscopy, positioned with its tips in the proximal and midright atrium. Spot chest radiograph confirms good catheter position. No pneumothorax. Catheter was flushed and primed per protocol. Catheter secured externally with O Prolene sutures. The left IJ   dermatotomy site was closed with Dermabond. No immediate complication.  IMPRESSION:  1. Technically successful placement of tunneled left IJ hemodialysis catheter with ultrasound and fluoroscopic guidance. Ready for routine use.  Access management: Remains approachable for percutaneous intervention as needed.  Original Report Authenticated By: Osa Craver, M.D.   Ir US Guide Vasc Access Left  08/20/2011  *RADIOLOGY REPORT*  Clinical data: End-stage renal disease.  Occluded arm graft.  Needs access for hemodialysis.  TUNNELED HEMODIALYSIS CATHETER PLACEMENT WITH ULTRASOUND AND FLUOROSCOPIC GUIDANCE:  Comparison: None  Technique: The procedure, risks, benefits, and alternatives were explained to the patient.  Questions regarding the procedure were encouraged and answered.  The patient understands and consents to the procedure. The patient was on adequate   antibiotic coverage, so no additional  preprocedural antibiotics were administered. Ultrasound suggested occlusion of the right IJ vein at the thoracic inlet.    Patency of the left IJ vein was confirmed with ultrasound with image documentation. An appropriate skin site was determined. Region was prepped using maximum barrier technique including cap and mask, sterile gown, sterile gloves, large sterile sheet, and Chlorhexidine   as cutaneous antisepsis. The region was infiltrated locally with 1% lidocaine. Under real-time ultrasound guidance, the left IJ vein was accessed with a 21 gauge micropuncture needle; the needle tip within the vein was confirmed with ultrasound image documentation.   Needle exchanged over the 018 guidewire for transitional dilator, which allowed advancement of a Benson wire into the IVC. Over this, an MPA catheter was advanced. A Hemosplit 23 hemodialysis catheter was tunneled from the right anterior chest wall approach to the left IJ dermatotomy site. The MPA catheter was exchanged over an Amplatz wire for serial vascular dilators which allow placement of a peel-away sheath, through which the catheter was advanced under intermittent fluoroscopy, positioned with its tips in the proximal and midright atrium. Spot chest radiograph confirms good catheter position. No pneumothorax. Catheter was flushed and primed per protocol. Catheter secured externally with O Prolene sutures. The left IJ   dermatotomy site was closed with Dermabond. No immediate complication.  IMPRESSION:  1. Technically successful placement of tunneled left IJ hemodialysis catheter with ultrasound and fluoroscopic guidance. Ready for routine use.  Access management: Remains approachable for percutaneous intervention as needed.  Original Report Authenticated By: Osa Craver, M.D.     Medications:        . aspirin EC  81 mg Oral Daily  . ceFEPime (MAXIPIME) IV  1 g Intravenous Q24H  . darbepoetin (ARANESP) injection - DIALYSIS  60 mcg  Intravenous Q Mon-HD  . famotidine  40 mg Oral QHS  . ferric gluconate (FERRLECIT/NULECIT) IV  125 mg Intravenous Q M,W,F-HD  . metoprolol tartrate  12.5 mg Oral BID  . metroNIDAZOLE  500 mg Oral Q8H  . sodium chloride  25 mL/kg Intravenous Once  . vancomycin  250 mg Oral Q6H  . DISCONTD: famotidine  20 mg Oral QHS   acetaminophen, heparin, metoprolol  Assessment/ Plan:  1.  ESRD / usual MWF [place]- L upper arm AVF is clotted.temporary dialysis catheter.  VVS consult for new AVF consulted 2. GI- stable 3. Leukocytosis-CDiff and iscemic limb appears chronic no overt infection 4. Lytic lesion right pelvis and abnormal appearance on CT of both kidneys - will need w/u eventually 5. Peripheral Artery Disease - keep eye on left toe 6. Anemia - no idea of baseline Hb and cannot get info from HD unit. Check iron studies and start Darbe gave 60 yesterday - since still cannot get hold of his unit will start 60/week. 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 AFib/flutter - per primary 9. CDiff positive-on vanco and flagyl Received dialysis today      LOS: 9 Cherylynn Liszewski W @TODAY @9 :26 AM

## 2011-08-22 NOTE — Consult Note (Signed)
Patient Identification:  [name] Date of Evaluation:  08/22/2011 Reason for Consult: Evaluate suicidal ideatin  Referring Provider: Dr. Arthor Captain    Past Psychiatric History:Pt developed ESRD and refused to have a fistula implanted and family insisted to have is placed.  Past Medical History:     Past Medical History  Diagnosis Date  . Hyperlipidemia   . ESRD (end stage renal disease)     a. MWF dialysis in [place] (followed by Dr. Kristian Covey)  . Arthritis   . Claudication   . Stroke 2005  . Anemia   . Chronic diastolic CHF (congestive heart failure) 07/2010    a. 07/2008 Echo EF 50-55%, mild-mod LVH, Gr 1 DD.  Marland Kitchen Peripheral vascular occlusive disease     a. 07/2011 Periph Angio: No signif Ao-illiac dzs, LCF stenosis, 100% LSFA w recon above knee pop and 3 vessel runoff, 100% RSFA w/ recon above knee --> pending L Fem to below Knee pop bypass.  Marland Kitchen History of tobacco abuse   . Hypertension     takes Amlodipine,Metoprolol,and Prinivil daily  . Dyspnea on exertion     with exertion       Past Surgical History  Procedure Date  . Arteriovenous graft placement   . Av fistula placement 12/10/2000    Right brachiocephalic arteriovenous   . Hernia repair   . Aortagram 07/29/2011    Abdominal Aortagram  . Cardiac catheterization 08/01/11    Left heart catheterization    Allergies: No Known Allergies  Current Medications:  Prior to Admission medications   Medication Sig Start Date End Date Taking? Authorizing Provider  amLODipine (NORVASC) 10 MG tablet Take 10 mg by mouth daily.   Yes Historical Provider, MD  aspirin EC 81 MG tablet Take 1 tablet (81 mg total) by mouth daily. 07/31/11 07/30/12 Yes Ok Anis, NP  calcium acetate (PHOSLO) 667 MG capsule Take 667-1,334 mg by mouth 3 (three) times daily with meals. 2 caps with each meal, 1 cap with snacks   Yes Historical Provider, MD  cinacalcet (SENSIPAR) 30 MG tablet Take 30 mg by mouth at bedtime.   Yes Historical Provider, MD    gabapentin (NEURONTIN) 100 MG capsule Take 100 mg by mouth 2 (two) times daily.   Yes Historical Provider, MD  isosorbide mononitrate (IMDUR) 30 MG 24 hr tablet Take 30 mg by mouth daily.   Yes Historical Provider, MD  lanthanum (FOSRENOL) 1000 MG chewable tablet Chew 1,000-2,000 mg by mouth 2 (two) times daily with a meal. 2 tabs with each meal, 1 tab with snacks   Yes Historical Provider, MD  lisinopril (PRINIVIL,ZESTRIL) 30 MG tablet Take 30 mg by mouth daily.   Yes Historical Provider, MD  metoprolol succinate (TOPROL-XL) 100 MG 24 hr tablet Take 100 mg by mouth 2 (two) times daily. Take with or immediately following a meal.   Yes Historical Provider, MD  multivitamin (RENA-VIT) TABS tablet Take 1 tablet by mouth daily.   Yes Historical Provider, MD  oxyCODONE (OXY IR/ROXICODONE) 5 MG immediate release tablet Take 5-10 mg by mouth every 6 (six) hours as needed. For pain   Yes Historical Provider, MD  sevelamer (RENVELA) 800 MG tablet Take 800-2,400 mg by mouth 3 (three) times daily with meals. Take 4 tabs with meals and 1 tab with snacks   Yes Historical Provider, MD    Social History:    reports that he quit smoking about 4 months ago. His smoking use included Cigarettes. He has a 40  pack-year smoking history. He does not have any smokeless tobacco history on file. He reports that he does not drink alcohol or use illicit drugs.   Family History:    Family History  Problem Relation Age of Onset  . Breast cancer Mother   . Cancer Father   . Anesthesia problems Neg Hx   . Hypotension Neg Hx   . Malignant hyperthermia Neg Hx   . 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  AXIS IV economic problems, other psychosocial or environmental problems, problems related to social environment and problems with primary support group  AXIS V 61-70 mild symptoms     Assessment/Plan: RN provides recent developments.  Pt was admitted for fistula placement and hemodialysis.  He refused surgical procedure.  Family insisted to continue with procedure  It was placed in his neck.  Then pt refused hemodialysis.   The Pt is sitting up in bed.  He is talking about his recent diagnosis and says he wants to go to hemodialysis in am.  He denies any thoughts of suicide.  He says a community MD began his dialysis but had a special way to do it -"He put all the fluid in my stomach and then took it out, "  "It was embalming fluid"  He denied he had peritoneal dialysis but is is difficult to follow his train of thought. He says his mood is good and his appetite is improving.  He is sleeping well.  His presentation is  A contradiction to all hx that proceeded him. He is in good spirits and says after hemodialysis he wants to go home.  RECOMMENDATION:  1. Continue hemodialysis per schedule. 2. Will follow pt in am.  Burnetta Kohls J. Ferol Luz, MD Psychiatrist 08/22/2011 1:09 AM

## 2011-08-22 NOTE — Progress Notes (Signed)
DAILY PROGRESS NOTE                              [facility]  SUBJECTIVE: Feels better today, he is confused and disoriented.  OBJECTIVE: BP 105/52  Pulse 103  Temp(Src) 98 F (36.7 C) (Oral)  Resp 17  Ht 5\' 6"  (1.676 m)  Wt 68.6 kg (151 lb 3.8 oz)  BMI 24.41 kg/m2  SpO2 98%  Intake/Output Summary (Last 24 hours) at 08/22/11 1356 Last data filed at 08/22/11 1100  Gross per 24 hour  Intake    580 ml  Output   1022 ml  Net   -442 ml                      Weight change: -1.2 kg (-2 lb 10.3 oz) Physical Exam: General: Alert and awake, but disoriented. not in any acute distress. HEENT: anicteric sclera, pupils equal reactive to light and accommodation CVS: S1-S2 heard, no murmur rubs or gallops Chest: clear to auscultation bilaterally, no wheezing rales or rhonchi Abdomen:  normal bowel sounds, soft, nontender, nondistended, no organomegaly Neuro: Cranial nerves II-XII intact, no focal neurological deficits Extremities: no cyanosis, no clubbing or edema noted bilaterally   Lab Results:  Basename 08/22/11 0615  NA 135  K 3.2*  CL 96  CO2 24  GLUCOSE 109*  BUN 33*  CREATININE 5.87*  CALCIUM 9.0  MG --  PHOS 5.3*    Basename 08/22/11 0615  AST --  ALT --  ALKPHOS --  BILITOT --  PROT --  ALBUMIN 1.9*   No results found for this basename: LIPASE:2,AMYLASE:2 in the last 72 hours  Basename 08/22/11 0629  WBC 15.1*  NEUTROABS --  HGB 7.2*  HCT 22.5*  MCV 89.6  PLT 206   No results found for this basename: CKTOTAL:3,CKMB:3,CKMBINDEX:3,TROPONINI:3 in the last 72 hours No components found with this basename: POCBNP:3 No results found for this basename: DDIMER:2 in the last 72 hours No results found for this basename: HGBA1C:2 in the last 72 hours No results found for this basename: CHOL:2,HDL:2,LDLCALC:2,TRIG:2,CHOLHDL:2,LDLDIRECT:2 in the last 72 hours No results found for this basename: TSH,T4TOTAL,FREET3,T3FREE,THYROIDAB in  the last 72 hours No results found for this basename: VITAMINB12:2,FOLATE:2,FERRITIN:2,TIBC:2,IRON:2,RETICCTPCT:2 in the last 72 hours  Micro Results: Recent Results (from the past 240 hour(s))  CULTURE, BLOOD (ROUTINE X 2)     Status: Normal   Collection Time   08/13/11  3:15 PM      Component Value Range Status Comment   Specimen Description BLOOD CENTRAL LINE   Final    Special Requests BOTTLES DRAWN AEROBIC AND ANAEROBIC   Final    Culture  Setup Time [id number]   Final    Culture     Final    Value: DIPHTHEROIDS(CORYNEBACTERIUM SPECIES)     Note: Standardized susceptibility testing for this organism is not available.     Note: Gram Stain Report Called to,Read Back By and Verified With: GENIVEVE WROBLEWSKI @1150  08/15/11 BY KRAWS   Report Status 08/16/2011 FINAL   Final   CULTURE, BLOOD (ROUTINE X 2)     Status: Normal   Collection Time   08/13/11  3:50 PM      Component Value Range Status Comment   Specimen Description BLOOD HAND RIGHT   Final    Special Requests BOTTLES DRAWN AEROBIC ONLY 5CC   Final    Culture  Setup  Time [id number]   Final    Culture NO GROWTH 5 DAYS   Final    Report Status 08/20/2011 FINAL   Final   MRSA PCR SCREENING     Status: Normal   Collection Time   08/13/11  6:50 PM      Component Value Range Status Comment   MRSA by PCR NEGATIVE  NEGATIVE  Final   CLOSTRIDIUM DIFFICILE BY PCR     Status: Abnormal   Collection Time   08/13/11 10:46 PM      Component Value Range Status Comment   C difficile by pcr POSITIVE (*) NEGATIVE  Final   BODY FLUID CULTURE     Status: Normal   Collection Time   08/15/11  5:58 PM      Component Value Range Status Comment   Specimen Description PLEURAL FLUID RIGHT   Final    Special Requests NONE   Final    Gram Stain     Final    Value: RARE WBC PRESENT,BOTH PMN AND MONONUCLEAR     NO ORGANISMS SEEN   Culture NO GROWTH 3 DAYS   Final    Report Status 08/19/2011 FINAL   Final     Studies/Results: Ct Abdomen  Pelvis Wo Contrast  08/13/2011  *RADIOLOGY REPORT*  Clinical Data: Abdominal pain.  Unresponsive.  CT ABDOMEN AND PELVIS WITHOUT CONTRAST  Technique:  Multidetector CT imaging of the abdomen and pelvis was performed following the standard protocol without intravenous contrast.  Comparison: None.  Findings: Lack of intravenous and oral contrast severely limits the exam.  Moderate bilateral pleural effusions and bibasilar consolidation. Coronary artery calcifications.  Aortic valvular calcifications.  Gallbladder is decompressed.  Liver is within normal limits.  Small amount of free fluid anterior to the liver.  The stomach is markedly distended.  Small bowel loops are also markedly distended.  Distal small bowel is decompressed.  There is a small amount of enteric contrast in the lower abdomen at the midline likely contained within a bowel loop.  Colon is also relatively decompressed.  Small amount of free fluid is seen layering in the pelvis.  The kidneys have a markedly abnormal appearance bilaterally.  The left kidney is enlarged and markedly deformed with areas of curvilinear calcification and mixed density.  Similar changes in the right kidney but to a lesser degree.  Underlying renal mass cannot be excluded.  Dense vascular calcifications are noted.  No obvious free intraperitoneal gas.  Metallic staples are seen in the anterior abdominal wall to the left of midline in the lower abdomen.  Prominent degenerative changes of the hip joints there is a lytic area in  in the right pelvis, above the right acetabulum.  There is loss of cortex in the overlying bone.  There is also a lytic area in the posterior inferior right acetabulum on image 88.  These lytic areas are greater than one would expect with simple degenerative changes.  Aggressive process cannot be excluded.  Gas within venous structures near the right groin are likely iatrogenic.  IMPRESSION: Very limited exam.  Bilateral pleural effusions and bibasilar  consolidation.  Small bowel obstruction pattern.  The transition point is in the lower abdomen.  There are lytic lesions about the right acetabulum as described. An aggressive process such as metastatic disease or myeloma cannot be excluded.  MRI may be helpful.  Markedly abnormal appearance of the kidneys.  Renal mass cannot be excluded.  Original Report Authenticated By: Donavan Burnet, M.D.  Dg Chest 1 View  08/13/2011  *RADIOLOGY REPORT*  Clinical Data: Pneumonia  CHEST - 1 VIEW  Comparison: 08/13/2011  Findings: Diffuse airspace disease throughout the right lung. Small right pleural effusion.  Minimal linear atelectasis at the left base.  Cardiomegaly.  Airspace disease on the left has improved.  IMPRESSION: Improved left airspace disease.  Stable airspace disease throughout the right lung with a right pleural effusion.  Original Report Authenticated By: Donavan Burnet, M.D.   Dg Chest 2 View  07/31/2011  *RADIOLOGY REPORT*  Clinical Data: Dyspnea on exertion  CHEST - 2 VIEW  Comparison: 09/10/2010  Findings: There heart size is mildly enlarged.  There is a moderate-sized right pleural effusion the left mid airspace consolidation involving the right lower lobe.  Left lung appears clear.  IMPRESSION:  1. Right pleural effusion and right lower lobe airspace consolidation.  These results will be called to the ordering clinician or representative by the Radiologist Assistant, and communication documented in the PACS Dashboard.  Original Report Authenticated By: Rosealee Albee, M.D.   Ir Pta Venous Left  08/18/2011  *RADIOLOGY REPORT*  Clinical history:End-stage renal disease and clotted left upper extremity cephalic vein fistula.  PROCEDURE(S): DIALYSIS  FISTULA DECLOT; CEPHALIC VEIN ANGIOPLASTY; ULTRASOUND GUIDANCE FOR VASCULAR ACCESS  Physician: Rachelle Hora. Henn, MD  Medications:Heparin 3000 units, TPA 2 mg  Fluoroscopy time: 22.5 minutes  Contrast: 100 ml Omnipaque 300  Procedure:Informed consent was  obtained for a declot procedure. The left upper arm was prepped and draped in a sterile fashion. Maximal barrier sterile technique was utilized including caps, mask, sterile gowns, sterile gloves, sterile drape, hand hygiene and skin antiseptic.  The skin was anesthetized with 1% lidocaine. The fistula was accessed using a 21 gauge needle towards the central venous system with ultrasound guidance.  Ultrasound images were obtained for documentation. Micropuncture catheter was placed. A 6-French vascular sheath was placed.  A Kumpe catheter was advanced in the central veins using a Glidewire.  Central venogram was performed.  The catheter was pulled back as TPA was infused within the cephalic vein thrombus.  Fluoroscopic images were saved for documentation. A Bentson wire was placed centrally and the cephalic vein was treated with the AngioJet thrombectomy device. The cephalic vein was also angioplastied with a 6 mm x 40 mm balloon.  Thrombus was treated again with the AngioJet device and the PTD thrombectomy device.  Using ultrasound guidance, a 21 gauge needle was directed into the vein towards the arterial anastomosis.  Micropuncture dilator set was placed and a 6 [place] vascular sheath was placed. Although there was already flow from the arterial system, a Fogarty balloon was pulled through the vein in order to dislodge thrombus. There was improved flow within the fistula after using the Fogarty balloon.  The central cephalic vein was angioplastied with 6 mm x 40 mm balloon.  At this point, there was flow throughout the fistula.  However, the flow was only temporary.  The central cephalic vein and mid humeral cephalic vein required angioplasty multiple times in order to keep the cephalic vein patent.   At the end of the procedure, there was a pulse within the cephalic vein but poor outflow.  The vascular sheaths were removed with pursestring sutures.  Findings:The left cephalic vein fistula anastomosis is near the  elbow.  The vein was patent near the arterial anastomosis but occluded in the mid and upper arm.  The central cephalic vein was also patent.  A large amount of  the thrombus was removed using the AngioJet and mechanical thrombectomy device.  Eventually, there was some flow throughout the fistula after balloon angioplasty of a tight stenosis in the central cephalic vein and recurrent stenosis in the mid humeral cephalic vein. At the end of the procedure, there was still a large amount of thrombus within the aneurysmal segment of the mid humeral cephalic vein.  Partial recanalization of the upper left arm cephalic vein.  Impression:Declot of the left upper extremity fistula.  There is improved flow through the fistula but the outflow is tenuous and high risk to occlude again.  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  Discussed the declot procedure with Dr. Hyman Hopes.   Recommend trying the fistula for dialysis.  If the fistula occludes again, recommend a catheter and surgical consultation for a new access.  Original Report Authenticated By: Richarda Overlie, M.D.   Ir Av Dialysis Graft Declot  08/18/2011  *RADIOLOGY REPORT*  Clinical history:End-stage renal disease and clotted left upper extremity cephalic vein fistula.  PROCEDURE(S): DIALYSIS  FISTULA DECLOT; CEPHALIC VEIN ANGIOPLASTY; ULTRASOUND GUIDANCE FOR VASCULAR ACCESS  Physician: Rachelle Hora. Henn, MD  Medications:Heparin 3000 units, TPA 2 mg  Fluoroscopy time: 22.5 minutes  Contrast: 100 ml Omnipaque 300  Procedure:Informed consent was obtained for a declot procedure. The left upper arm was prepped and draped in a sterile fashion. Maximal barrier sterile technique was utilized including caps, mask, sterile gowns, sterile gloves, sterile drape, hand hygiene and skin antiseptic.  The skin was anesthetized with 1% lidocaine. The fistula was accessed using a 21 gauge needle towards the central venous system with ultrasound guidance.  Ultrasound  images were obtained for documentation. Micropuncture catheter was placed. A 6-French vascular sheath was placed.  A Kumpe catheter was advanced in the central veins using a Glidewire.  Central venogram was performed.  The catheter was pulled back as TPA was infused within the cephalic vein thrombus.  Fluoroscopic images were saved for documentation. A Bentson wire was placed centrally and the cephalic vein was treated with the AngioJet thrombectomy device. The cephalic vein was also angioplastied with a 6 mm x 40 mm balloon.  Thrombus was treated again with the AngioJet device and the PTD thrombectomy device.  Using ultrasound guidance, a 21 gauge needle was directed into the vein towards the arterial anastomosis.  Micropuncture dilator set was placed and a 6 [place] vascular sheath was placed. Although there was already flow from the arterial system, a Fogarty balloon was pulled through the vein in order to dislodge thrombus. There was improved flow within the fistula after using the Fogarty balloon.  The central cephalic vein was angioplastied with 6 mm x 40 mm balloon.  At this point, there was flow throughout the fistula.  However, the flow was only temporary.  The central cephalic vein and mid humeral cephalic vein required angioplasty multiple times in order to keep the cephalic vein patent.   At the end of the procedure, there was a pulse within the cephalic vein but poor outflow.  The vascular sheaths were removed with pursestring sutures.  Findings:The left cephalic vein fistula anastomosis is near the elbow.  The vein was patent near the arterial anastomosis but occluded in the mid and upper arm.  The central cephalic vein was also patent.  A large amount of the thrombus was removed using the AngioJet and mechanical thrombectomy device.  Eventually, there was some flow throughout the fistula after balloon angioplasty of a tight stenosis in  the central cephalic vein and recurrent stenosis in the mid humeral  cephalic vein. At the end of the procedure, there was still a large amount of thrombus within the aneurysmal segment of the mid humeral cephalic vein.  Partial recanalization of the upper left arm cephalic vein.  Impression:Declot of the left upper extremity fistula.  There is improved flow through the fistula but the outflow is tenuous and high risk to occlude again.  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  Discussed the declot procedure with Dr. Hyman Hopes.   Recommend trying the fistula for dialysis.  If the fistula occludes again, recommend a catheter and surgical consultation for a new access.  Original Report Authenticated By: Richarda Overlie, M.D.   Ir Angio Av Shunt Addl Access  08/18/2011  *RADIOLOGY REPORT*  Clinical history:End-stage renal disease and clotted left upper extremity cephalic vein fistula.  PROCEDURE(S): DIALYSIS  FISTULA DECLOT; CEPHALIC VEIN ANGIOPLASTY; ULTRASOUND GUIDANCE FOR VASCULAR ACCESS  Physician: Rachelle Hora. Henn, MD  Medications:Heparin 3000 units, TPA 2 mg  Fluoroscopy time: 22.5 minutes  Contrast: 100 ml Omnipaque 300  Procedure:Informed consent was obtained for a declot procedure. The left upper arm was prepped and draped in a sterile fashion. Maximal barrier sterile technique was utilized including caps, mask, sterile gowns, sterile gloves, sterile drape, hand hygiene and skin antiseptic.  The skin was anesthetized with 1% lidocaine. The fistula was accessed using a 21 gauge needle towards the central venous system with ultrasound guidance.  Ultrasound images were obtained for documentation. Micropuncture catheter was placed. A 6-French vascular sheath was placed.  A Kumpe catheter was advanced in the central veins using a Glidewire.  Central venogram was performed.  The catheter was pulled back as TPA was infused within the cephalic vein thrombus.  Fluoroscopic images were saved for documentation. A Bentson wire was placed centrally and the cephalic vein was  treated with the AngioJet thrombectomy device. The cephalic vein was also angioplastied with a 6 mm x 40 mm balloon.  Thrombus was treated again with the AngioJet device and the PTD thrombectomy device.  Using ultrasound guidance, a 21 gauge needle was directed into the vein towards the arterial anastomosis.  Micropuncture dilator set was placed and a 6 [place] vascular sheath was placed. Although there was already flow from the arterial system, a Fogarty balloon was pulled through the vein in order to dislodge thrombus. There was improved flow within the fistula after using the Fogarty balloon.  The central cephalic vein was angioplastied with 6 mm x 40 mm balloon.  At this point, there was flow throughout the fistula.  However, the flow was only temporary.  The central cephalic vein and mid humeral cephalic vein required angioplasty multiple times in order to keep the cephalic vein patent.   At the end of the procedure, there was a pulse within the cephalic vein but poor outflow.  The vascular sheaths were removed with pursestring sutures.  Findings:The left cephalic vein fistula anastomosis is near the elbow.  The vein was patent near the arterial anastomosis but occluded in the mid and upper arm.  The central cephalic vein was also patent.  A large amount of the thrombus was removed using the AngioJet and mechanical thrombectomy device.  Eventually, there was some flow throughout the fistula after balloon angioplasty of a tight stenosis in the central cephalic vein and recurrent stenosis in the mid humeral cephalic vein. At the end of the procedure, there was still a large amount of  thrombus within the aneurysmal segment of the mid humeral cephalic vein.  Partial recanalization of the upper left arm cephalic vein.  Impression:Declot of the left upper extremity fistula.  There is improved flow through the fistula but the outflow is tenuous and high risk to occlude again.  Residual thrombus within the aneurysmal  segment of the cephalic vein.  Discussed the declot procedure with Dr. Hyman Hopes.   Recommend trying the fistula for dialysis.  If the fistula occludes again, recommend a catheter and surgical consultation for a new access.  Original Report Authenticated By: Richarda Overlie, M.D.   Ir US Guide Vasc Access Left  08/18/2011  *RADIOLOGY REPORT*  Clinical history:End-stage renal disease and clotted left upper extremity cephalic vein fistula.  PROCEDURE(S): DIALYSIS  FISTULA DECLOT; CEPHALIC VEIN ANGIOPLASTY; ULTRASOUND GUIDANCE FOR VASCULAR ACCESS  Physician: Rachelle Hora. Henn, MD  Medications:Heparin 3000 units, TPA 2 mg  Fluoroscopy time: 22.5 minutes  Contrast: 100 ml Omnipaque 300  Procedure:Informed consent was obtained for a declot procedure. The left upper arm was prepped and draped in a sterile fashion. Maximal barrier sterile technique was utilized including caps, mask, sterile gowns, sterile gloves, sterile drape, hand hygiene and skin antiseptic.  The skin was anesthetized with 1% lidocaine. The fistula was accessed using a 21 gauge needle towards the central venous system with ultrasound guidance.  Ultrasound images were obtained for documentation. Micropuncture catheter was placed. A 6-French vascular sheath was placed.  A Kumpe catheter was advanced in the central veins using a Glidewire.  Central venogram was performed.  The catheter was pulled back as TPA was infused within the cephalic vein thrombus.  Fluoroscopic images were saved for documentation. A Bentson wire was placed centrally and the cephalic vein was treated with the AngioJet thrombectomy device. The cephalic vein was also angioplastied with a 6 mm x 40 mm balloon.  Thrombus was treated again with the AngioJet device and the PTD thrombectomy device.  Using ultrasound guidance, a 21 gauge needle was directed into the vein towards the arterial anastomosis.  Micropuncture dilator set was placed and a 6 [place] vascular sheath was placed. Although there was  already flow from the arterial system, a Fogarty balloon was pulled through the vein in order to dislodge thrombus. There was improved flow within the fistula after using the Fogarty balloon.  The central cephalic vein was angioplastied with 6 mm x 40 mm balloon.  At this point, there was flow throughout the fistula.  However, the flow was only temporary.  The central cephalic vein and mid humeral cephalic vein required angioplasty multiple times in order to keep the cephalic vein patent.   At the end of the procedure, there was a pulse within the cephalic vein but poor outflow.  The vascular sheaths were removed with pursestring sutures.  Findings:The left cephalic vein fistula anastomosis is near the elbow.  The vein was patent near the arterial anastomosis but occluded in the mid and upper arm.  The central cephalic vein was also patent.  A large amount of the thrombus was removed using the AngioJet and mechanical thrombectomy device.  Eventually, there was some flow throughout the fistula after balloon angioplasty of a tight stenosis in the central cephalic vein and recurrent stenosis in the mid humeral cephalic vein. At the end of the procedure, there was still a large amount of thrombus within the aneurysmal segment of the mid humeral cephalic vein.  Partial recanalization of the upper left arm cephalic vein.  Impression:Declot of the left  upper extremity fistula.  There is improved flow through the fistula but the outflow is tenuous and high risk to occlude again.  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  Discussed the declot procedure with Dr. Hyman Hopes.   Recommend trying the fistula for dialysis.  If the fistula occludes again, recommend a catheter and surgical consultation for a new access.  Original Report Authenticated By: Richarda Overlie, M.D.   Dg Chest Port 1 View  08/16/2011  *RADIOLOGY REPORT*  Clinical Data: Pneumonia.  Pleural effusion.  PORTABLE CHEST - 1 VIEW  Comparison:  08/15/2011  Findings: Nasogastric tube extends into the stomach.  Cardiomegaly noted with mild interstitial accentuation and increased obscuration of the right hemidiaphragm primarily ascribed to pleural effusion and associated passive atelectasis.  There is mild atelectasis at the left lung base along with a small left pleural effusion.  Lung volumes are slightly worsened compared to yesterday's exam.  Atherosclerotic calcification of the aortic arch noted. Glenohumeral degenerative findings noted.  IMPRESSION:  1.  Increase in apparent size of the right pleural effusion with associated passive atelectasis. 2.  Small left pleural effusion with left basilar atelectasis also noted. 3.  Cardiomegaly with minimal interstitial accentuation.  Original Report Authenticated By: Dellia Cloud, M.D.   Dg Chest Port 1 View  08/15/2011  *RADIOLOGY REPORT*  Clinical Data: Status post right thoracentesis.  PORTABLE CHEST - 1 VIEW  Comparison: Earlier the same day  Findings: 1556 hours.  Right pleural effusion has decreased in the interval.  No evidence for pneumothorax.  There is some subsegmental atelectasis at the right base. The NG tube passes into the stomach although the distal tip position is not included on the film. The cardiopericardial silhouette is enlarged.  Diffuse interstitial opacity is improved in the interval, suggesting resolving edema.  IMPRESSION: No evidence for pneumothorax after right thoracentesis.  Original Report Authenticated By: ERIC A. MANSELL, M.D.   Dg Chest Port 1 View  08/15/2011  *RADIOLOGY REPORT*  Clinical Data: Shortness of breath and pleural effusion.  PORTABLE CHEST - 1 VIEW  Comparison: 08/14/2011  Findings: The NG tube passes into the stomach although the distal tip position is not included on the film. The cardiopericardial silhouette is enlarged.  Vascular congestion with diffuse interstitial opacity is consistent with edema.  There is bibasilar atelectasis and right  pleural effusion.  IMPRESSION: No substantial change.  Cardiomegaly with edema and right pleural effusion.  Original Report Authenticated By: ERIC A. MANSELL, M.D.   Portable Chest Xray In Am  08/14/2011  *RADIOLOGY REPORT*  Clinical Data: Respiratory failure  PORTABLE CHEST - 1 VIEW  Comparison: Aug 13, 2011  Findings: There is cardiomegaly and pulmonary edema, as well as a moderate sized right pleural effusion.  Overall, the findings  have not significantly changed.  The nasogastric tube tip courses off the inferior film.  IMPRESSION: No significant change in cardiomegaly, pulmonary edema, moderate sized right pleural effusion.  Original Report Authenticated By: Brandon Melnick, M.D.   Dg Chest Port 1 View  08/13/2011  *RADIOLOGY REPORT*  Clinical Data: Unresponsive.  Shortness of breath and abdominal distention.  PORTABLE CHEST - 1 VIEW  Comparison: 07/31/2011.  Findings: Trachea is midline.  Heart size stable.  There is bilateral air space disease, right greater than left, with progression from 07/31/2011.  Associated right pleural effusion. There may be a tiny left pleural effusion.  IMPRESSION: Worsening bilateral air space disease with an associated right pleural effusion.  Question tiny  left pleural effusion.  Congestive heart failure is likely.  Original Report Authenticated By: Reyes Ivan, M.D.   Dg Abd Portable 1v  08/16/2011  *RADIOLOGY REPORT*  Clinical Data: Small bowel obstruction  PORTABLE ABDOMEN - 1 VIEW  Comparison: 08/15/2011  Findings: Vascular calcifications and rim calcified left renal lesions noted.  Nasogastric tube extends into the stomach.  The right femoral line projects over the pelvis.  Left pelvic skin clips noted.  Currently most of the bowel is gasless.  The visualized loops of bowel do not demonstrate bowel dilatation.  Lumbar spondylosis is again noted. Pleural effusions noted at the lung bases, with cardiomegaly.  IMPRESSION:  1.  No dilated bowel is currently  visible, although much of the bowel is gasless. 2.  Small right and smaller left pleural effusions. 3.  Calcified left renal cysts or masses. 4.  Vascular calcifications. 5.  Right femoral line tip projects over the right sacral ala.  Original Report Authenticated By: Dellia Cloud, M.D.   Dg Abd Portable 1v  08/15/2011  *RADIOLOGY REPORT*  Clinical Data: Small bowel obstruction.  PORTABLE ABDOMEN - 1 VIEW  Comparison: Plain films and CT abdomen and pelvis 08/13/2011.  Findings: New NG tube and right femoral catheter are in place. Surgical staples to the left of midline again noted.  Gas filled small bowel loops are again seen but appear less numerous and prominent.  Bilateral upper quadrant calcifications are noted and consistent with renal calcifications seen on CT.  IMPRESSION: Mild improvement small bowel obstruction.  Original Report Authenticated By: Bernadene Bell. Maricela Curet, M.D.   Dg Abd Portable 1v  08/13/2011  *RADIOLOGY REPORT*  Clinical Data: Unresponsive.  Shortness of breath and abdominal distention.  PORTABLE ABDOMEN - 1 VIEW  Comparison: None.  Findings: There is mild gaseous distention of small bowel loops. Some colonic gas and stool are seen.  Surgical skin staples overlie the left lower quadrant.  Peripherally calcified structure in the left paraspinal region has the shape of the left kidney.  Additional tubular calcifications are seen in the left upper quadrant.  There may be small calcifications in the right paraspinal region.  IMPRESSION:  1.  Mild small bowel distention with possible wall thickening.  CT abdomen pelvis with contrast would likely be helpful in further evaluation, as clinically indicated. 2.  Calcified structures in the left upper quadrant, and likely right paraspinal region, as discussed above.  These could also be further assessed with CT.  Original Report Authenticated By: Reyes Ivan, M.D.   Medications: Scheduled Meds:    . aspirin EC  81 mg Oral Daily    . ceFEPime (MAXIPIME) IV  1 g Intravenous Q24H  . darbepoetin (ARANESP) injection - DIALYSIS  60 mcg Intravenous Q Mon-HD  . famotidine  40 mg Oral QHS  . ferric gluconate (FERRLECIT/NULECIT) IV  125 mg Intravenous Q M,W,F-HD  . metoprolol tartrate  12.5 mg Oral BID  . metroNIDAZOLE  500 mg Oral Q8H  . sodium chloride  25 mL/kg Intravenous Once  . vancomycin  250 mg Oral Q6H  . DISCONTD: famotidine  20 mg Oral QHS   Continuous Infusions:  PRN Meds:.acetaminophen, heparin, metoprolol  ASSESSMENT & PLAN: Active Problems:  ESRD (end stage renal disease)  Hypertension  End stage renal disease  Peripheral vascular disease, unspecified  Pleural effusion  SBO (small bowel obstruction)  Pneumonia  C. difficile colitis  Atrial fibrillation with RVR  Atrial flutter  CAD (coronary artery disease)   Sepsis -Patient  admitted with septic shock to the ICU with the need of support of vasopressors. -Weaned quickly off of the vasopressors, his ejection fraction about 50% with inferior/septal hypokinesis. -Likely secondary to pneumonia and C. difficile colitis, resolved now.  C. difficile colitis -Positive C. difficile PCR. Patient is on Flagyl, oral vancomycin added on 08/19/2011. -Patient has slightly distended abdomen, KUB was done no air-fluid level or ileus.  -Patient also on IV vancomycin and cefepime for pneumonia, this is day 9 we'll discontinue the morning.  End stage renal disease -On hemodialysis, appreciate nephrology help.  -He had his dialysis catheter placed yesterday by interventional radiology on dialysis restarted.  Atrial fibrillation/flutter with RVR -This is new onset abdomen the ICU with a blood pressure was low. -Currently sinus rhythm, and his rate is controlled. -Initially Coumadin recommended, but final recommendation from cardiology is to start aspirin. -Patient to discuss the benefits of anticoagulations with his primary care  physician.  Hypertension -Controlled, continue preadmission medications.  Pleural effusion -Pleural effusion was tapped while patient in the ICU. -According to the LDH and this is his transudative process likely secondary to his renal/heart disease. -Pulmonary was following, recommended no further management  Anemia -Acute on chronic anemia, secondary to his chronic kidney disease. -Transfuse 2 units of packed RBCs was dialysis exam.  Disposition -Patient dialysis restarted, likely to be discharged when it's okay with nephrology. -PT/OT recommended a home with home health services.    LOS: 9 days   Lecretia Buczek A 08/22/2011, 1:56 PM

## 2011-08-22 NOTE — Progress Notes (Signed)
Occupational Therapy Treatment Patient Details Name: [name] MRN: [id number] DOB: December 22, 1950 Today's Date: 08/22/2011 Time: 1350-1402 OT Time Calculation (min): 12 min  OT Assessment / Plan / Recommendation Comments on Treatment Session Pt limited this session by low hgb. Only agreeable to sit up in chair. Con't to recommend 24/7 A and HHOT. Pt may need snf. Will determine based on progress.    Follow Up Recommendations  Home health OT;Supervision/Assistance - 24 hour    Barriers to Discharge       Equipment Recommendations  None recommended by OT    Recommendations for Other Services    Frequency Min 2X/week   Plan Discharge plan remains appropriate    Precautions / Restrictions Precautions Precautions: Fall   Pertinent Vitals/Pain C/o B foot pain which pt could not rate.    ADL  Toilet Transfer: Simulated;Maximal assistance Toilet Transfer Method: Stand pivot Toilet Transfer Equipment: Other (comment) (to recliner.) ADL Comments: RN asked if OT would limit activity to just OOB to chair due to low hgb and no plans to transfuse until tomorrow.    OT Diagnosis:    OT Problem List:   OT Treatment Interventions:     OT Goals ADL Goals ADL Goal: Additional Goal #1 - Progress: Not progressing  Visit Information  Last OT Received On: 08/15/11 Assistance Needed: +2    Subjective Data  Subjective: My feet feel real bad.   Prior Functioning       Cognition  Overall Cognitive Status: Impaired Area of Impairment: Awareness of errors Arousal/Alertness: Awake/alert Orientation Level: Appears intact for tasks assessed Behavior During Session: [facility] for tasks performed Awareness of Errors: Assistance required to identify errors made;Assistance required to correct errors made    Mobility Bed Mobility Bed Mobility: Supine to Sit Supine to Sit: 4: Min assist;HOB elevated;With rails Details for Bed Mobility Assistance: min VCs for hand placement, sequencing and  technique. Transfers Sit to Stand: 3: Mod assist;From bed;From elevated surface Stand to Sit: 4: Min assist;With upper extremity assist;With armrests;To chair/3-in-1 Details for Transfer Assistance: Pt required extensive assist to rise with B knees buckling. Max VCs for hand placement and technique. Pt also had difficulty initiating movement and shifting weight from one leg to the other.   Exercises    Balance Dynamic Sitting Balance Dynamic Sitting - Balance Support: No upper extremity supported;Feet supported Static Standing Balance Static Standing - Balance Support: Bilateral upper extremity supported Static Standing - Level of Assistance: 3: Mod assist  End of Session OT - End of Session Activity Tolerance: Patient limited by fatigue Patient left: in chair;with call bell/phone within reach;with family/visitor present   [name] A OTR/L [phone] 08/22/2011, 2:13 PM

## 2011-08-22 NOTE — Progress Notes (Signed)
Physical Therapy Treatment Patient Details Name: [name] MRN: [id number] DOB: [birth year] Today's Date: 08/22/2011 Time: 0865-7846 PT Time Calculation (min): 23 min  PT Assessment / Plan / Recommendation Comments on Treatment Session  mentation is still decreased though pt able to focus and participate in therapy today.    Follow Up Recommendations  Skilled nursing facility (unless he has 24 hour assist)    Barriers to Discharge        Equipment Recommendations  Defer to next venue    Recommendations for Other Services    Frequency Min 3X/week   Plan Discharge plan needs to be updated    Precautions / Restrictions Precautions Precautions: Fall   Pertinent Vitals/Pain     Mobility  Bed Mobility Bed Mobility: Sit to Supine Supine to Sit: 4: Min assist;HOB elevated;With rails Sit to Supine: 4: Min assist Details for Bed Mobility Assistance: vc's to redirect to task; assist with LE's Transfers Transfers: Sit to Stand;Stand to Sit Sit to Stand: 3: Mod assist;From bed;From elevated surface Stand to Sit: 4: Min assist;With upper extremity assist;With armrests;To chair/3-in-1 Details for Transfer Assistance: vc/tc's for hand placement;  assist to come forward Ambulation/Gait Ambulation/Gait Assistance: 4: Min assist;Other (comment) (degrading to Mod A with fatigue/pain) Ambulation Distance (Feet): 60 Feet Assistive device: Rolling walker Ambulation/Gait Assistance Details: antalgic gait; vc's to keep him inside the RW; stability assist Gait Pattern: Step-through pattern;Decreased step length - right;Decreased step length - left;Decreased stride length;Wide base of support Stairs: No Wheelchair Mobility Wheelchair Mobility: No    Exercises     PT Diagnosis:    PT Problem List:   PT Treatment Interventions:     PT Goals Acute Rehab PT Goals Time For Goal Achievement: 08/25/11 Potential to Achieve Goals: Good PT Goal: Sit to Supine/Side - Progress: Progressing  toward goal PT Goal: Sit to Stand - Progress: Progressing toward goal PT Goal: Ambulate - Progress: Progressing toward goal  Visit Information  Last PT Received On: 08/22/11 Assistance Needed: +1    Subjective Data  Subjective: pt reporting his feet hurting with WB   Cognition  Overall Cognitive Status: Impaired Area of Impairment: Awareness of errors Arousal/Alertness: Awake/alert Orientation Level: Appears intact for tasks assessed Behavior During Session: [facility] for tasks performed Awareness of Errors: Assistance required to identify errors made;Assistance required to correct errors made    Balance  Dynamic Sitting Balance Dynamic Sitting - Balance Support: No upper extremity supported;Feet supported Static Standing Balance Static Standing - Balance Support: Bilateral upper extremity supported Static Standing - Level of Assistance: 3: Mod assist  End of Session PT - End of Session Activity Tolerance: Patient tolerated treatment well Patient left: in bed;with call bell/phone within reach;with family/visitor present Psychiatrist) Nurse Communication: Mobility status    [name], [name] 08/22/2011, 3:53 PM  08/22/2011  [name], PT [phone] (pager)

## 2011-08-22 NOTE — Progress Notes (Signed)
   CARE MANAGEMENT NOTE 08/22/2011  Patient:  [name], [name]   Account Number:  [id number]  Date Initiated:  08/14/2011  Documentation initiated by:  [facility]  Subjective/Objective Assessment:   Admitted with sepsis - found down.  Lives alone.  HD patient     Action/Plan:   Anticipated DC Date:  08/14/2011   Anticipated DC Plan:  HOME/SELF CARE      DC Planning Services  CM consult      [facility] Choice  HOME HEALTH   Choice offered to / List presented to:  C-5 Sibling        HH arranged  HH-1 RN  HH-2 PT  HH-3 OT  HH-4 NURSE'S AIDE      HH agency  [facility]   Status of service:  In process, will continue to follow Medicare Important Message given?   (If response is "NO", the following Medicare IM given date fields will be blank) Date Medicare IM given:   Date Additional Medicare IM given:    Discharge Disposition:    Per UR Regulation:  Reviewed for med. necessity/level of care/duration of stay  If discussed at Long Length of Stay Meetings, dates discussed:   08/20/2011    Comments:  Contact listes as [name] - sister - at [phone] -  08/22/2011 [address] RN, [place] [phone] Per Dr. Arthor Captain plan discharge to home with RN, PT, OT and aide. The patient will be going to live with his sister upon discharge. The patient to continue HD as outpatient.  Spoke with Tobi Bastos Renal PA to advise of plan discharge to home and HD at [facility].  Call to sister [name]: [phone] Discussed plan to discharge tomorrow, verified patient to live with her upon discharge. She noted the patient will be staying with her at [address] [facility] [address] [place]     Phone: cell # [phone]   home: [phone] Discussed choice for home care agency to provide RN, PT, OT and aide. [facility] selected. They can provide transportation to the dialysis center.  [facility] Debbie: [phone] referral for home RN, PT, OT, aide.  08-20-11 [address] [name], [place] [phone] Pt was  discussed in the long length of stay meeting today.   08-14-11 1:22pm [name], RNBSN [phone] UR Completed - attempted to  call contact number - a male voice answered stated this is a contact number - it is work - and they will give him a message to call.  08-14-11 2:52pm [name], RNBSN [phone] Sister - [name] on unit in patients room.  States patient used to live with her.  Has been on own since March - can live again with sister if needs to on discharge.  Has listed wife as contact - [phone] sister states legally separated.   Also listed is [name] - [phone] -   SW notified to identify contact and legal next of kin. Staff state anther male by earlier - identified as cousisn - stating she is only next of kin???

## 2011-08-23 ENCOUNTER — Inpatient Hospital Stay (HOSPITAL_COMMUNITY): Payer: Medicare Other

## 2011-08-23 DIAGNOSIS — N186 End stage renal disease: Secondary | ICD-10-CM

## 2011-08-23 DIAGNOSIS — A413 Sepsis due to Hemophilus influenzae: Secondary | ICD-10-CM

## 2011-08-23 DIAGNOSIS — F0390 Unspecified dementia without behavioral disturbance: Secondary | ICD-10-CM

## 2011-08-23 DIAGNOSIS — A0472 Enterocolitis due to Clostridium difficile, not specified as recurrent: Secondary | ICD-10-CM

## 2011-08-23 LAB — RENAL FUNCTION PANEL
BUN: 22 mg/dL (ref 6–23)
CO2: 27 mEq/L (ref 19–32)
Calcium: 8.8 mg/dL (ref 8.4–10.5)
Creatinine, Ser: 4.87 mg/dL — ABNORMAL HIGH (ref 0.50–1.35)
GFR calc Af Amer: 13 mL/min — ABNORMAL LOW (ref >90)
Glucose, Bld: 111 mg/dL — ABNORMAL HIGH (ref 70–99)
Phosphorus: 4.8 mg/dL — ABNORMAL HIGH (ref 2.3–4.6)
Sodium: 137 mEq/L (ref 135–145)

## 2011-08-23 LAB — CBC
Hemoglobin: 6.6 g/dL — CL (ref 13.0–17.0)
MCH: 28.6 pg (ref 26.0–34.0)
MCHC: 31.6 g/dL (ref 30.0–36.0)
MCV: 90.5 fL (ref 78.0–100.0)
RBC: 2.31 MIL/uL — ABNORMAL LOW (ref 4.22–5.81)

## 2011-08-23 MED ORDER — OXYCODONE HCL 5 MG PO TABS: 5.0000 mg | ORAL_TABLET | Freq: Four times a day (QID) | ORAL | Status: DC | PRN

## 2011-08-23 MED ORDER — METRONIDAZOLE 500 MG PO TABS: 500.0000 mg | ORAL_TABLET | Freq: Three times a day (TID) | ORAL | Status: AC

## 2011-08-23 MED ORDER — METRONIDAZOLE 500 MG PO TABS: 500.0000 mg | ORAL_TABLET | Freq: Three times a day (TID) | ORAL | Status: DC

## 2011-08-23 NOTE — Discharge Summary (Signed)
Physician Discharge Summary  [name] ZOX:[id number] DOB: 1950-07-30 DOA: 08/13/2011  PCP: Toma Deiters, MD, MD  Admit date: 08/13/2011 Discharge date: 08/23/2011  Recommendations for Outpatient Follow-up:  1. Home health services including PT/OT. 2. Followup with primary care physician, nephrologist in your vascular surgeon.  Discharge Diagnoses:  Active Problems:  ESRD (end stage renal disease)  Hypertension  End stage renal disease  Peripheral vascular disease, unspecified  Pleural effusion  SBO (small bowel obstruction)  Pneumonia  C. difficile colitis  Atrial fibrillation with RVR  Atrial flutter  CAD (coronary artery disease)   1. Sepsis  Discharge Condition: Stable  Diet recommendation: Renal diet  History of present illness:  61yo male with hx ESRD (MWF HD), CVA, PVD, HTN, R pleural effusion who was found at home 5/22 lethargic, covered in stool and vomit. He was last seen normal 5/20 when he went for HD. Per family he was in his usual state of health prior to Monday. On arrival to ER was hypotensive with SBP 40's and PCCM called to admit for septic shock.    Hospital Course:   1. Severe septic shock: Patient admitted with septic shock to the ICU, his procalcitonin was over 70, patient initially needed vasopressors. After aggressive hydration with IV fluids he was weaned quickly off the vasopressors. On please note that his ejection fraction is 50% with inferior septal hypokinesis on echocardiogram done in the hospital. The sepsis is likely secondary to pneumonia and C. difficile colitis. This is resolved completely before patient was transferred out of the ICU to telemetry bed.  2. C. difficile colitis patient tested positive for C. difficile PCR, he was placed on Flagyl. This is likely what complicated the sepsis. Patient did not develop ileus or megacolon. But because severity of the infection he oral vancomycin was added on 08/19/2011. Patient was followed  radiographically by x-rays and clinically by serial examinations. He was doing much better and the diarrhea resolved. At the time of discharge decision was made for him to get Flagyl for 7 more days.  3. End-stage renal disease: Patient is getting his dialysis on Monday, Wednesday and Friday. He had declotting procedure for his left cephalic vein dialysis fistula which was unsuccessful and showed the patient still have a clot in there. Several left IJ HD catheter was placed on 08/20/2011. Patient started dialysis through that axis. Patient eventually is going to need permanent HD axis as outpatient. Nephrology was following during this hospital stay.  4. Atrial flutter/fibrillation with RVR: This is new onset, happened in the ICU when his blood pressure was low, patient was converted to normal sinus rhythm. Cardiology was consulted while he was in the hospital and they did recommend Coumadin initially. But because patient other comorbidities they did recommend aspirin at the time of discharge he is to follow as his primary care physician/cardiologist for further recommendations. Currently he is in normal sinus rhythm.  5. Hypertension: This is controlled his preadmission medication was continued throughout the hospital stay.  6. Pleural effusion: He had right-sided pleural effusion, this was tapped when he was in the ICU with removal of 800 mL of clear fluids. According to the LDH ratio this is transudative process, likely secondary to his renal/heart disease. Pulmonary was following and recommended no further management.  7. Anemia: Acute on chronic anemia, he does have chronic anemia secondary to his ESRD. He was getting his IV iron supplements and Aranesp with hemodialysis. He had total of 2 units of RBCs transfused during  hemodialysis.  8. Peripheral vascular disease: Patient follows with Dr. Myra Gianotti, he had a recent aortogram done on 07/29/2011, Dr. Myra Gianotti recommended, and followup with him for  consideration of left femoral to below knee popliteal artery bypass graft. According to the previous notes patient likely needs Myoview stress test before his vascular surgery.  9. Lytic bony lesion: At the time of admission CT scan of abdomen and pelvis was done, and he didn't showed a lytic bony lesion in the left acetabulum. This is his outpatient followup, likely MRI. Per radiologist reading this can be secondary to degenerative disease but aggressive process like metastasis or multiple myeloma cannot be excluded. Serum protein electrophoresis in the hospital showed indeterminate result, immunofixation electrophoresis was sent to rule out MGUS or early myeloma. Results were pending at the time of discharge.  10. Disposition: Because of patient deconditioning and lengthy stay in the hospital PT/OT was ordered, they both recommended a home health service. The patient ambulating very well and he can safely be discharged home.  Procedures:  Arterial line placement done by Dr. Max Fickle on 08/13/2011  Right-sided thoracentesis with removal of 800 mL of clear pleural fluid done by Dr. Craige Cotta on 08/15/2011.  Left cephalic vein fistula declotting done by Dr. Lowella Dandy on 08/18/2011.  Left IJ 23 cm hemo-split HD catheter placed by Dr. Deanne Coffer on 08/20/2011  Consultations:  Interventional radiology.  Critical care medicine.  Nephrology.  Discharge Exam: Filed Vitals:   08/23/11 1230  BP: 120/62  Pulse: 92  Temp:   Resp: 24   Filed Vitals:   08/23/11 1130 08/23/11 1145 08/23/11 1200 08/23/11 1230  BP: 132/61 127/64 165/66 120/62  Pulse: 93 92 91 92  Temp: 97.8 F (36.6 C) 98.5 F (36.9 C) 97.9 F (36.6 C)   TempSrc: Oral Oral Oral   Resp: 20 20 18 24   Height:      Weight:      SpO2:       Physical Exam:  General: Alert and awake, but disoriented. not in any acute distress.  HEENT: anicteric sclera, pupils equal reactive to light and accommodation  CVS: S1-S2 heard, no murmur  rubs or gallops  Chest: clear to auscultation bilaterally, no wheezing rales or rhonchi  Abdomen: normal bowel sounds, soft, nontender, nondistended, no organomegaly  Neuro: Cranial nerves II-XII intact, no focal neurological deficits  Extremities: no cyanosis, no clubbing or edema noted bilaterally  Discharge Instructions  Discharge Orders    Future Orders Please Complete By Expires   Diet - low sodium heart healthy      Increase activity slowly        Medication List  As of 08/23/2011 12:42 PM   TAKE these medications         amLODipine 10 MG tablet   Commonly known as: NORVASC   Take 10 mg by mouth daily.      aspirin EC 81 MG tablet   Take 1 tablet (81 mg total) by mouth daily.      calcium acetate 667 MG capsule   Commonly known as: PHOSLO   Take 667-1,334 mg by mouth 3 (three) times daily with meals. 2 caps with each meal, 1 cap with snacks      cinacalcet 30 MG tablet   Commonly known as: SENSIPAR   Take 30 mg by mouth at bedtime.      gabapentin 100 MG capsule   Commonly known as: NEURONTIN   Take 100 mg by mouth 2 (two) times daily.  isosorbide mononitrate 30 MG 24 hr tablet   Commonly known as: IMDUR   Take 30 mg by mouth daily.      lanthanum 1000 MG chewable tablet   Commonly known as: FOSRENOL   Chew 1,000-2,000 mg by mouth 2 (two) times daily with a meal. 2 tabs with each meal, 1 tab with snacks      lisinopril 30 MG tablet   Commonly known as: PRINIVIL,ZESTRIL   Take 30 mg by mouth daily.      metoprolol succinate 100 MG 24 hr tablet   Commonly known as: TOPROL-XL   Take 100 mg by mouth 2 (two) times daily. Take with or immediately following a meal.      metroNIDAZOLE 500 MG tablet   Commonly known as: FLAGYL   Take 1 tablet (500 mg total) by mouth every 8 (eight) hours.      multivitamin Tabs tablet   Take 1 tablet by mouth daily.      oxyCODONE 5 MG immediate release tablet   Commonly known as: Oxy IR/ROXICODONE   Take 1-2 tablets (5-10  mg total) by mouth every 6 (six) hours as needed for pain.      sevelamer 800 MG tablet   Commonly known as: RENVELA   Take 800-2,400 mg by mouth 3 (three) times daily with meals. Take 4 tabs with meals and 1 tab with snacks              The results of significant diagnostics from this hospitalization (including imaging, microbiology, ancillary and laboratory) are listed below for reference.    Significant Diagnostic Studies: Ct Abdomen Pelvis Wo Contrast  08/13/2011  *RADIOLOGY REPORT*  Clinical Data: Abdominal pain.  Unresponsive.  CT ABDOMEN AND PELVIS WITHOUT CONTRAST  Technique:  Multidetector CT imaging of the abdomen and pelvis was performed following the standard protocol without intravenous contrast.  Comparison: None.  Findings: Lack of intravenous and oral contrast severely limits the exam.  Moderate bilateral pleural effusions and bibasilar consolidation. Coronary artery calcifications.  Aortic valvular calcifications.  Gallbladder is decompressed.  Liver is within normal limits.  Small amount of free fluid anterior to the liver.  The stomach is markedly distended.  Small bowel loops are also markedly distended.  Distal small bowel is decompressed.  There is a small amount of enteric contrast in the lower abdomen at the midline likely contained within a bowel loop.  Colon is also relatively decompressed.  Small amount of free fluid is seen layering in the pelvis.  The kidneys have a markedly abnormal appearance bilaterally.  The left kidney is enlarged and markedly deformed with areas of curvilinear calcification and mixed density.  Similar changes in the right kidney but to a lesser degree.  Underlying renal mass cannot be excluded.  Dense vascular calcifications are noted.  No obvious free intraperitoneal gas.  Metallic staples are seen in the anterior abdominal wall to the left of midline in the lower abdomen.  Prominent degenerative changes of the hip joints there is a lytic area in   in the right pelvis, above the right acetabulum.  There is loss of cortex in the overlying bone.  There is also a lytic area in the posterior inferior right acetabulum on image 88.  These lytic areas are greater than one would expect with simple degenerative changes.  Aggressive process cannot be excluded.  Gas within venous structures near the right groin are likely iatrogenic.  IMPRESSION: Very limited exam.  Bilateral pleural effusions and bibasilar  consolidation.  Small bowel obstruction pattern.  The transition point is in the lower abdomen.  There are lytic lesions about the right acetabulum as described. An aggressive process such as metastatic disease or myeloma cannot be excluded.  MRI may be helpful.  Markedly abnormal appearance of the kidneys.  Renal mass cannot be excluded.  Original Report Authenticated By: Donavan Burnet, M.D.   Dg Chest 1 View  08/13/2011  *RADIOLOGY REPORT*  Clinical Data: Pneumonia  CHEST - 1 VIEW  Comparison: 08/13/2011  Findings: Diffuse airspace disease throughout the right lung. Small right pleural effusion.  Minimal linear atelectasis at the left base.  Cardiomegaly.  Airspace disease on the left has improved.  IMPRESSION: Improved left airspace disease.  Stable airspace disease throughout the right lung with a right pleural effusion.  Original Report Authenticated By: Donavan Burnet, M.D.   Dg Chest 2 View  07/31/2011  *RADIOLOGY REPORT*  Clinical Data: Dyspnea on exertion  CHEST - 2 VIEW  Comparison: 09/10/2010  Findings: There heart size is mildly enlarged.  There is a moderate-sized right pleural effusion the left mid airspace consolidation involving the right lower lobe.  Left lung appears clear.  IMPRESSION:  1. Right pleural effusion and right lower lobe airspace consolidation.  These results will be called to the ordering clinician or representative by the Radiologist Assistant, and communication documented in the PACS Dashboard.  Original Report Authenticated By:  Rosealee Albee, M.D.   Dg Abd 1 View  08/20/2011  *RADIOLOGY REPORT*  Clinical Data: Abdominal distention.  ABDOMEN - 1 VIEW  Comparison: Plain film the abdomen 08/16/2011 and CT abdomen and pelvis 08/13/2011.  Findings: There are some scattered gas filled but nondilated loops of small bowel.  Small amount of gas is seen in the ascending colon.  NG tube is no longer visualized.  Calcified cystic lesion in the left upper quadrant as seen on CT noted.  Atherosclerotic vascular disease is also noted.  IMPRESSION: Some increase in gas filled but nondilated loops of small bowel could be due to progressive small bowel obstruction.  Original Report Authenticated By: Bernadene Bell. Maricela Curet, M.D.   Ir Pta Venous Left  08/18/2011  *RADIOLOGY REPORT*  Clinical history:End-stage renal disease and clotted left upper extremity cephalic vein fistula.  PROCEDURE(S): DIALYSIS  FISTULA DECLOT; CEPHALIC VEIN ANGIOPLASTY; ULTRASOUND GUIDANCE FOR VASCULAR ACCESS  Physician: Rachelle Hora. Henn, MD  Medications:Heparin 3000 units, TPA 2 mg  Fluoroscopy time: 22.5 minutes  Contrast: 100 ml Omnipaque 300  Procedure:Informed consent was obtained for a declot procedure. The left upper arm was prepped and draped in a sterile fashion. Maximal barrier sterile technique was utilized including caps, mask, sterile gowns, sterile gloves, sterile drape, hand hygiene and skin antiseptic.  The skin was anesthetized with 1% lidocaine. The fistula was accessed using a 21 gauge needle towards the central venous system with ultrasound guidance.  Ultrasound images were obtained for documentation. Micropuncture catheter was placed. A 6-French vascular sheath was placed.  A Kumpe catheter was advanced in the central veins using a Glidewire.  Central venogram was performed.  The catheter was pulled back as TPA was infused within the cephalic vein thrombus.  Fluoroscopic images were saved for documentation. A Bentson wire was placed centrally and the cephalic  vein was treated with the AngioJet thrombectomy device. The cephalic vein was also angioplastied with a 6 mm x 40 mm balloon.  Thrombus was treated again with the AngioJet device and the PTD thrombectomy device.  Using ultrasound  guidance, a 21 gauge needle was directed into the vein towards the arterial anastomosis.  Micropuncture dilator set was placed and a 6 [place] vascular sheath was placed. Although there was already flow from the arterial system, a Fogarty balloon was pulled through the vein in order to dislodge thrombus. There was improved flow within the fistula after using the Fogarty balloon.  The central cephalic vein was angioplastied with 6 mm x 40 mm balloon.  At this point, there was flow throughout the fistula.  However, the flow was only temporary.  The central cephalic vein and mid humeral cephalic vein required angioplasty multiple times in order to keep the cephalic vein patent.   At the end of the procedure, there was a pulse within the cephalic vein but poor outflow.  The vascular sheaths were removed with pursestring sutures.  Findings:The left cephalic vein fistula anastomosis is near the elbow.  The vein was patent near the arterial anastomosis but occluded in the mid and upper arm.  The central cephalic vein was also patent.  A large amount of the thrombus was removed using the AngioJet and mechanical thrombectomy device.  Eventually, there was some flow throughout the fistula after balloon angioplasty of a tight stenosis in the central cephalic vein and recurrent stenosis in the mid humeral cephalic vein. At the end of the procedure, there was still a large amount of thrombus within the aneurysmal segment of the mid humeral cephalic vein.  Partial recanalization of the upper left arm cephalic vein.  Impression:Declot of the left upper extremity fistula.  There is improved flow through the fistula but the outflow is tenuous and high risk to occlude again.  Residual thrombus within the  aneurysmal segment of the cephalic vein.  Discussed the declot procedure with Dr. Hyman Hopes.   Recommend trying the fistula for dialysis.  If the fistula occludes again, recommend a catheter and surgical consultation for a new access.  Original Report Authenticated By: Richarda Overlie, M.D.   Ir Fluoro Guide Cv Line Left  08/20/2011  *RADIOLOGY REPORT*  Clinical data: End-stage renal disease.  Occluded arm graft.  Needs access for hemodialysis.  TUNNELED HEMODIALYSIS CATHETER PLACEMENT WITH ULTRASOUND AND FLUOROSCOPIC GUIDANCE:  Comparison: None  Technique: The procedure, risks, benefits, and alternatives were explained to the patient.  Questions regarding the procedure were encouraged and answered.  The patient understands and consents to the procedure. The patient was on adequate   antibiotic coverage, so no additional preprocedural antibiotics were administered. Ultrasound suggested occlusion of the right IJ vein at the thoracic inlet.    Patency of the left IJ vein was confirmed with ultrasound with image documentation. An appropriate skin site was determined. Region was prepped using maximum barrier technique including cap and mask, sterile gown, sterile gloves, large sterile sheet, and Chlorhexidine   as cutaneous antisepsis. The region was infiltrated locally with 1% lidocaine. Under real-time ultrasound guidance, the left IJ vein was accessed with a 21 gauge micropuncture needle; the needle tip within the vein was confirmed with ultrasound image documentation.   Needle exchanged over the 018 guidewire for transitional dilator, which allowed advancement of a Benson wire into the IVC. Over this, an MPA catheter was advanced. A Hemosplit 23 hemodialysis catheter was tunneled from the right anterior chest wall approach to the left IJ dermatotomy site. The MPA catheter was exchanged over an Amplatz wire for serial vascular dilators which allow placement of a peel-away sheath, through which the catheter was advanced under  intermittent fluoroscopy, positioned  with its tips in the proximal and midright atrium. Spot chest radiograph confirms good catheter position. No pneumothorax. Catheter was flushed and primed per protocol. Catheter secured externally with O Prolene sutures. The left IJ   dermatotomy site was closed with Dermabond. No immediate complication.  IMPRESSION:  1. Technically successful placement of tunneled left IJ hemodialysis catheter with ultrasound and fluoroscopic guidance. Ready for routine use.  Access management: Remains approachable for percutaneous intervention as needed.  Original Report Authenticated By: Osa Craver, M.D.   Ir Av Dialysis Graft Declot  08/18/2011  *RADIOLOGY REPORT*  Clinical history:End-stage renal disease and clotted left upper extremity cephalic vein fistula.  PROCEDURE(S): DIALYSIS  FISTULA DECLOT; CEPHALIC VEIN ANGIOPLASTY; ULTRASOUND GUIDANCE FOR VASCULAR ACCESS  Physician: Rachelle Hora. Henn, MD  Medications:Heparin 3000 units, TPA 2 mg  Fluoroscopy time: 22.5 minutes  Contrast: 100 ml Omnipaque 300  Procedure:Informed consent was obtained for a declot procedure. The left upper arm was prepped and draped in a sterile fashion. Maximal barrier sterile technique was utilized including caps, mask, sterile gowns, sterile gloves, sterile drape, hand hygiene and skin antiseptic.  The skin was anesthetized with 1% lidocaine. The fistula was accessed using a 21 gauge needle towards the central venous system with ultrasound guidance.  Ultrasound images were obtained for documentation. Micropuncture catheter was placed. A 6-French vascular sheath was placed.  A Kumpe catheter was advanced in the central veins using a Glidewire.  Central venogram was performed.  The catheter was pulled back as TPA was infused within the cephalic vein thrombus.  Fluoroscopic images were saved for documentation. A Bentson wire was placed centrally and the cephalic vein was treated with the AngioJet  thrombectomy device. The cephalic vein was also angioplastied with a 6 mm x 40 mm balloon.  Thrombus was treated again with the AngioJet device and the PTD thrombectomy device.  Using ultrasound guidance, a 21 gauge needle was directed into the vein towards the arterial anastomosis.  Micropuncture dilator set was placed and a 6 [place] vascular sheath was placed. Although there was already flow from the arterial system, a Fogarty balloon was pulled through the vein in order to dislodge thrombus. There was improved flow within the fistula after using the Fogarty balloon.  The central cephalic vein was angioplastied with 6 mm x 40 mm balloon.  At this point, there was flow throughout the fistula.  However, the flow was only temporary.  The central cephalic vein and mid humeral cephalic vein required angioplasty multiple times in order to keep the cephalic vein patent.   At the end of the procedure, there was a pulse within the cephalic vein but poor outflow.  The vascular sheaths were removed with pursestring sutures.  Findings:The left cephalic vein fistula anastomosis is near the elbow.  The vein was patent near the arterial anastomosis but occluded in the mid and upper arm.  The central cephalic vein was also patent.  A large amount of the thrombus was removed using the AngioJet and mechanical thrombectomy device.  Eventually, there was some flow throughout the fistula after balloon angioplasty of a tight stenosis in the central cephalic vein and recurrent stenosis in the mid humeral cephalic vein. At the end of the procedure, there was still a large amount of thrombus within the aneurysmal segment of the mid humeral cephalic vein.  Partial recanalization of the upper left arm cephalic vein.  Impression:Declot of the left upper extremity fistula.  There is improved flow through the fistula but the  outflow is tenuous and high risk to occlude again.  Residual thrombus within the aneurysmal segment of the cephalic  vein.  Discussed the declot procedure with Dr. Hyman Hopes.   Recommend trying the fistula for dialysis.  If the fistula occludes again, recommend a catheter and surgical consultation for a new access.  Original Report Authenticated By: Richarda Overlie, M.D.   Ir Angio Av Shunt Addl Access  08/18/2011  *RADIOLOGY REPORT*  Clinical history:End-stage renal disease and clotted left upper extremity cephalic vein fistula.  PROCEDURE(S): DIALYSIS  FISTULA DECLOT; CEPHALIC VEIN ANGIOPLASTY; ULTRASOUND GUIDANCE FOR VASCULAR ACCESS  Physician: Rachelle Hora. Henn, MD  Medications:Heparin 3000 units, TPA 2 mg  Fluoroscopy time: 22.5 minutes  Contrast: 100 ml Omnipaque 300  Procedure:Informed consent was obtained for a declot procedure. The left upper arm was prepped and draped in a sterile fashion. Maximal barrier sterile technique was utilized including caps, mask, sterile gowns, sterile gloves, sterile drape, hand hygiene and skin antiseptic.  The skin was anesthetized with 1% lidocaine. The fistula was accessed using a 21 gauge needle towards the central venous system with ultrasound guidance.  Ultrasound images were obtained for documentation. Micropuncture catheter was placed. A 6-French vascular sheath was placed.  A Kumpe catheter was advanced in the central veins using a Glidewire.  Central venogram was performed.  The catheter was pulled back as TPA was infused within the cephalic vein thrombus.  Fluoroscopic images were saved for documentation. A Bentson wire was placed centrally and the cephalic vein was treated with the AngioJet thrombectomy device. The cephalic vein was also angioplastied with a 6 mm x 40 mm balloon.  Thrombus was treated again with the AngioJet device and the PTD thrombectomy device.  Using ultrasound guidance, a 21 gauge needle was directed into the vein towards the arterial anastomosis.  Micropuncture dilator set was placed and a 6 [place] vascular sheath was placed. Although there was already flow from the  arterial system, a Fogarty balloon was pulled through the vein in order to dislodge thrombus. There was improved flow within the fistula after using the Fogarty balloon.  The central cephalic vein was angioplastied with 6 mm x 40 mm balloon.  At this point, there was flow throughout the fistula.  However, the flow was only temporary.  The central cephalic vein and mid humeral cephalic vein required angioplasty multiple times in order to keep the cephalic vein patent.   At the end of the procedure, there was a pulse within the cephalic vein but poor outflow.  The vascular sheaths were removed with pursestring sutures.  Findings:The left cephalic vein fistula anastomosis is near the elbow.  The vein was patent near the arterial anastomosis but occluded in the mid and upper arm.  The central cephalic vein was also patent.  A large amount of the thrombus was removed using the AngioJet and mechanical thrombectomy device.  Eventually, there was some flow throughout the fistula after balloon angioplasty of a tight stenosis in the central cephalic vein and recurrent stenosis in the mid humeral cephalic vein. At the end of the procedure, there was still a large amount of thrombus within the aneurysmal segment of the mid humeral cephalic vein.  Partial recanalization of the upper left arm cephalic vein.  Impression:Declot of the left upper extremity fistula.  There is improved flow through the fistula but the outflow is tenuous and high risk to occlude again.  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  Discussed the declot procedure with  Dr. Hyman Hopes.   Recommend trying the fistula for dialysis.  If the fistula occludes again, recommend a catheter and surgical consultation for a new access.  Original Report Authenticated By: Richarda Overlie, M.D.   Ir US Guide Vasc Access Left  08/20/2011  *RADIOLOGY REPORT*  Clinical data: End-stage renal disease.  Occluded arm graft.  Needs access for hemodialysis.   TUNNELED HEMODIALYSIS CATHETER PLACEMENT WITH ULTRASOUND AND FLUOROSCOPIC GUIDANCE:  Comparison: None  Technique: The procedure, risks, benefits, and alternatives were explained to the patient.  Questions regarding the procedure were encouraged and answered.  The patient understands and consents to the procedure. The patient was on adequate   antibiotic coverage, so no additional preprocedural antibiotics were administered. Ultrasound suggested occlusion of the right IJ vein at the thoracic inlet.    Patency of the left IJ vein was confirmed with ultrasound with image documentation. An appropriate skin site was determined. Region was prepped using maximum barrier technique including cap and mask, sterile gown, sterile gloves, large sterile sheet, and Chlorhexidine   as cutaneous antisepsis. The region was infiltrated locally with 1% lidocaine. Under real-time ultrasound guidance, the left IJ vein was accessed with a 21 gauge micropuncture needle; the needle tip within the vein was confirmed with ultrasound image documentation.   Needle exchanged over the 018 guidewire for transitional dilator, which allowed advancement of a Benson wire into the IVC. Over this, an MPA catheter was advanced. A Hemosplit 23 hemodialysis catheter was tunneled from the right anterior chest wall approach to the left IJ dermatotomy site. The MPA catheter was exchanged over an Amplatz wire for serial vascular dilators which allow placement of a peel-away sheath, through which the catheter was advanced under intermittent fluoroscopy, positioned with its tips in the proximal and midright atrium. Spot chest radiograph confirms good catheter position. No pneumothorax. Catheter was flushed and primed per protocol. Catheter secured externally with O Prolene sutures. The left IJ   dermatotomy site was closed with Dermabond. No immediate complication.  IMPRESSION:  1. Technically successful placement of tunneled left IJ hemodialysis catheter with  ultrasound and fluoroscopic guidance. Ready for routine use.  Access management: Remains approachable for percutaneous intervention as needed.  Original Report Authenticated By: Osa Craver, M.D.   Ir US Guide Vasc Access Left  08/18/2011  *RADIOLOGY REPORT*  Clinical history:End-stage renal disease and clotted left upper extremity cephalic vein fistula.  PROCEDURE(S): DIALYSIS  FISTULA DECLOT; CEPHALIC VEIN ANGIOPLASTY; ULTRASOUND GUIDANCE FOR VASCULAR ACCESS  Physician: Rachelle Hora. Henn, MD  Medications:Heparin 3000 units, TPA 2 mg  Fluoroscopy time: 22.5 minutes  Contrast: 100 ml Omnipaque 300  Procedure:Informed consent was obtained for a declot procedure. The left upper arm was prepped and draped in a sterile fashion. Maximal barrier sterile technique was utilized including caps, mask, sterile gowns, sterile gloves, sterile drape, hand hygiene and skin antiseptic.  The skin was anesthetized with 1% lidocaine. The fistula was accessed using a 21 gauge needle towards the central venous system with ultrasound guidance.  Ultrasound images were obtained for documentation. Micropuncture catheter was placed. A 6-French vascular sheath was placed.  A Kumpe catheter was advanced in the central veins using a Glidewire.  Central venogram was performed.  The catheter was pulled back as TPA was infused within the cephalic vein thrombus.  Fluoroscopic images were saved for documentation. A Bentson wire was placed centrally and the cephalic vein was treated with the AngioJet thrombectomy device. The cephalic vein was also angioplastied with a 6 mm x  40 mm balloon.  Thrombus was treated again with the AngioJet device and the PTD thrombectomy device.  Using ultrasound guidance, a 21 gauge needle was directed into the vein towards the arterial anastomosis.  Micropuncture dilator set was placed and a 6 [place] vascular sheath was placed. Although there was already flow from the arterial system, a Fogarty balloon was  pulled through the vein in order to dislodge thrombus. There was improved flow within the fistula after using the Fogarty balloon.  The central cephalic vein was angioplastied with 6 mm x 40 mm balloon.  At this point, there was flow throughout the fistula.  However, the flow was only temporary.  The central cephalic vein and mid humeral cephalic vein required angioplasty multiple times in order to keep the cephalic vein patent.   At the end of the procedure, there was a pulse within the cephalic vein but poor outflow.  The vascular sheaths were removed with pursestring sutures.  Findings:The left cephalic vein fistula anastomosis is near the elbow.  The vein was patent near the arterial anastomosis but occluded in the mid and upper arm.  The central cephalic vein was also patent.  A large amount of the thrombus was removed using the AngioJet and mechanical thrombectomy device.  Eventually, there was some flow throughout the fistula after balloon angioplasty of a tight stenosis in the central cephalic vein and recurrent stenosis in the mid humeral cephalic vein. At the end of the procedure, there was still a large amount of thrombus within the aneurysmal segment of the mid humeral cephalic vein.  Partial recanalization of the upper left arm cephalic vein.  Impression:Declot of the left upper extremity fistula.  There is improved flow through the fistula but the outflow is tenuous and high risk to occlude again.  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  Discussed the declot procedure with Dr. Hyman Hopes.   Recommend trying the fistula for dialysis.  If the fistula occludes again, recommend a catheter and surgical consultation for a new access.  Original Report Authenticated By: Richarda Overlie, M.D.   Dg Chest Port 1 View  08/16/2011  *RADIOLOGY REPORT*  Clinical Data: Pneumonia.  Pleural effusion.  PORTABLE CHEST - 1 VIEW  Comparison: 08/15/2011  Findings: Nasogastric tube extends into the  stomach.  Cardiomegaly noted with mild interstitial accentuation and increased obscuration of the right hemidiaphragm primarily ascribed to pleural effusion and associated passive atelectasis.  There is mild atelectasis at the left lung base along with a small left pleural effusion.  Lung volumes are slightly worsened compared to yesterday's exam.  Atherosclerotic calcification of the aortic arch noted. Glenohumeral degenerative findings noted.  IMPRESSION:  1.  Increase in apparent size of the right pleural effusion with associated passive atelectasis. 2.  Small left pleural effusion with left basilar atelectasis also noted. 3.  Cardiomegaly with minimal interstitial accentuation.  Original Report Authenticated By: Dellia Cloud, M.D.   Dg Chest Port 1 View  08/15/2011  *RADIOLOGY REPORT*  Clinical Data: Status post right thoracentesis.  PORTABLE CHEST - 1 VIEW  Comparison: Earlier the same day  Findings: 1556 hours.  Right pleural effusion has decreased in the interval.  No evidence for pneumothorax.  There is some subsegmental atelectasis at the right base. The NG tube passes into the stomach although the distal tip position is not included on the film. The cardiopericardial silhouette is enlarged.  Diffuse interstitial opacity is improved in the interval, suggesting resolving edema.  IMPRESSION: No  evidence for pneumothorax after right thoracentesis.  Original Report Authenticated By: ERIC A. MANSELL, M.D.   Dg Chest Port 1 View  08/15/2011  *RADIOLOGY REPORT*  Clinical Data: Shortness of breath and pleural effusion.  PORTABLE CHEST - 1 VIEW  Comparison: 08/14/2011  Findings: The NG tube passes into the stomach although the distal tip position is not included on the film. The cardiopericardial silhouette is enlarged.  Vascular congestion with diffuse interstitial opacity is consistent with edema.  There is bibasilar atelectasis and right pleural effusion.  IMPRESSION: No substantial change.   Cardiomegaly with edema and right pleural effusion.  Original Report Authenticated By: ERIC A. MANSELL, M.D.   Portable Chest Xray In Am  08/14/2011  *RADIOLOGY REPORT*  Clinical Data: Respiratory failure  PORTABLE CHEST - 1 VIEW  Comparison: Aug 13, 2011  Findings: There is cardiomegaly and pulmonary edema, as well as a moderate sized right pleural effusion.  Overall, the findings  have not significantly changed.  The nasogastric tube tip courses off the inferior film.  IMPRESSION: No significant change in cardiomegaly, pulmonary edema, moderate sized right pleural effusion.  Original Report Authenticated By: Brandon Melnick, M.D.   Dg Chest Port 1 View  08/13/2011  *RADIOLOGY REPORT*  Clinical Data: Unresponsive.  Shortness of breath and abdominal distention.  PORTABLE CHEST - 1 VIEW  Comparison: 07/31/2011.  Findings: Trachea is midline.  Heart size stable.  There is bilateral air space disease, right greater than left, with progression from 07/31/2011.  Associated right pleural effusion. There may be a tiny left pleural effusion.  IMPRESSION: Worsening bilateral air space disease with an associated right pleural effusion.  Question tiny left pleural effusion.  Congestive heart failure is likely.  Original Report Authenticated By: Reyes Ivan, M.D.   Dg Abd Portable 1v  08/16/2011  *RADIOLOGY REPORT*  Clinical Data: Small bowel obstruction  PORTABLE ABDOMEN - 1 VIEW  Comparison: 08/15/2011  Findings: Vascular calcifications and rim calcified left renal lesions noted.  Nasogastric tube extends into the stomach.  The right femoral line projects over the pelvis.  Left pelvic skin clips noted.  Currently most of the bowel is gasless.  The visualized loops of bowel do not demonstrate bowel dilatation.  Lumbar spondylosis is again noted. Pleural effusions noted at the lung bases, with cardiomegaly.  IMPRESSION:  1.  No dilated bowel is currently visible, although much of the bowel is gasless. 2.  Small  right and smaller left pleural effusions. 3.  Calcified left renal cysts or masses. 4.  Vascular calcifications. 5.  Right femoral line tip projects over the right sacral ala.  Original Report Authenticated By: Dellia Cloud, M.D.   Dg Abd Portable 1v  08/15/2011  *RADIOLOGY REPORT*  Clinical Data: Small bowel obstruction.  PORTABLE ABDOMEN - 1 VIEW  Comparison: Plain films and CT abdomen and pelvis 08/13/2011.  Findings: New NG tube and right femoral catheter are in place. Surgical staples to the left of midline again noted.  Gas filled small bowel loops are again seen but appear less numerous and prominent.  Bilateral upper quadrant calcifications are noted and consistent with renal calcifications seen on CT.  IMPRESSION: Mild improvement small bowel obstruction.  Original Report Authenticated By: Bernadene Bell. Maricela Curet, M.D.   Dg Abd Portable 1v  08/13/2011  *RADIOLOGY REPORT*  Clinical Data: Unresponsive.  Shortness of breath and abdominal distention.  PORTABLE ABDOMEN - 1 VIEW  Comparison: None.  Findings: There is mild gaseous distention of small bowel loops. Some  colonic gas and stool are seen.  Surgical skin staples overlie the left lower quadrant.  Peripherally calcified structure in the left paraspinal region has the shape of the left kidney.  Additional tubular calcifications are seen in the left upper quadrant.  There may be small calcifications in the right paraspinal region.  IMPRESSION:  1.  Mild small bowel distention with possible wall thickening.  CT abdomen pelvis with contrast would likely be helpful in further evaluation, as clinically indicated. 2.  Calcified structures in the left upper quadrant, and likely right paraspinal region, as discussed above.  These could also be further assessed with CT.  Original Report Authenticated By: Reyes Ivan, M.D.    Microbiology: Recent Results (from the past 240 hour(s))  CULTURE, BLOOD (ROUTINE X 2)     Status: Normal   Collection  Time   08/13/11  3:15 PM      Component Value Range Status Comment   Specimen Description BLOOD CENTRAL LINE   Final    Special Requests BOTTLES DRAWN AEROBIC AND ANAEROBIC   Final    Culture  Setup Time [id number]   Final    Culture     Final    Value: DIPHTHEROIDS(CORYNEBACTERIUM SPECIES)     Note: Standardized susceptibility testing for this organism is not available.     Note: Gram Stain Report Called to,Read Back By and Verified With: GENIVEVE WROBLEWSKI @1150  08/15/11 BY KRAWS   Report Status 08/16/2011 FINAL   Final   CULTURE, BLOOD (ROUTINE X 2)     Status: Normal   Collection Time   08/13/11  3:50 PM      Component Value Range Status Comment   Specimen Description BLOOD HAND RIGHT   Final    Special Requests BOTTLES DRAWN AEROBIC ONLY 5CC   Final    Culture  Setup Time [id number]   Final    Culture NO GROWTH 5 DAYS   Final    Report Status 08/20/2011 FINAL   Final   MRSA PCR SCREENING     Status: Normal   Collection Time   08/13/11  6:50 PM      Component Value Range Status Comment   MRSA by PCR NEGATIVE  NEGATIVE  Final   CLOSTRIDIUM DIFFICILE BY PCR     Status: Abnormal   Collection Time   08/13/11 10:46 PM      Component Value Range Status Comment   C difficile by pcr POSITIVE (*) NEGATIVE  Final   BODY FLUID CULTURE     Status: Normal   Collection Time   08/15/11  5:58 PM      Component Value Range Status Comment   Specimen Description PLEURAL FLUID RIGHT   Final    Special Requests NONE   Final    Gram Stain     Final    Value: RARE WBC PRESENT,BOTH PMN AND MONONUCLEAR     NO ORGANISMS SEEN   Culture NO GROWTH 3 DAYS   Final    Report Status 08/19/2011 FINAL   Final      Labs: Basic Metabolic Panel:  Lab 08/23/11 4696 08/22/11 0615 08/17/11 0844 08/16/11 1358  NA 137 135 141 141  K 2.9* 3.2* 4.0 4.1  CL 98 96 101 100  CO2 27 24 25 23   GLUCOSE 111* 109* 118* 133*  BUN 22 33* 54* 71*  CREATININE 4.87* 5.87* 6.77* 7.77*  CALCIUM 8.8 9.0 9.3 9.2  MG  -- -- -- --  PHOS 4.8*  5.3* 4.4 5.4*   Liver Function Tests:  Lab 08/23/11 1100 08/22/11 0615 08/17/11 0844 08/16/11 1358  AST -- -- -- --  ALT -- -- -- --  ALKPHOS -- -- -- --  BILITOT -- -- -- --  PROT -- -- -- --  ALBUMIN 1.8* 1.9* 2.2* 2.0*   No results found for this basename: LIPASE:5,AMYLASE:5 in the last 168 hours No results found for this basename: AMMONIA:5 in the last 168 hours CBC:  Lab 08/23/11 1100 08/22/11 0629 08/19/11 1200 08/17/11 0844 08/16/11 1358  WBC 15.1* 15.1* 23.9* 32.2* 25.6*  NEUTROABS -- -- -- -- --  HGB 6.6* 7.2* 8.1* 8.6* 9.3*  HCT 20.9* 22.5* 25.5* 27.2* 28.8*  MCV 90.5 89.6 88.2 87.5 87.8  PLT 218 206 237 225 197   Cardiac Enzymes: No results found for this basename: CKTOTAL:5,CKMB:5,CKMBINDEX:5,TROPONINI:5 in the last 168 hours BNP: BNP (last 3 results)  Basename 08/13/11 2315  PROBNP >70000.0*   CBG:  Lab 08/16/11 1742  GLUCAP 77    Time coordinating discharge: 40 minutes  Signed:  Clint Lipps, MD  [facility] 08/23/2011, 12:42 PM

## 2011-08-23 NOTE — Progress Notes (Signed)
[facility] ROUNDING NOTE   Subjective:   Interval History: none.  Objective:  Vital signs in last 24 hours:  Temp:  [97.8 F (36.6 C)-100 F (37.8 C)] 98.5 F (36.9 C) (06/01 0950) Pulse Rate:  [93-103] 96  (06/01 0950) Resp:  [17-18] 18  (06/01 0950) BP: (93-132)/(48-64) 118/48 mmHg (06/01 0950) SpO2:  [92 %-98 %] 93 % (06/01 0950) Weight:  [68 kg (149 lb 14.6 oz)] 68 kg (149 lb 14.6 oz) (06/01 0538)  Weight change: 0.9 kg (1 lb 15.8 oz) Filed Weights   08/22/11 0608 08/22/11 0935 08/23/11 0538  Weight: 68.6 kg (151 lb 3.8 oz) 68.6 kg (151 lb 3.8 oz) 68 kg (149 lb 14.6 oz)    Intake/Output: I/O last 3 completed shifts: In: 390 [P.O.:240; Other:100; IV Piggyback:50] Out: 1021 [Other:1020; Stool:1]   Intake/Output this shift:  Total I/O In: 60 [P.O.:60] Out: 1 [Stool:1]  CVS- RRR RS- CTA ABD- BS present soft non-distended EXT- no edema   Basic Metabolic Panel:  Lab 08/22/11 6237 08/17/11 0844 08/16/11 1358  NA 135 141 141  K 3.2* 4.0 4.1  CL 96 101 100  CO2 24 25 23   GLUCOSE 109* 118* 133*  BUN 33* 54* 71*  CREATININE 5.87* 6.77* 7.77*  CALCIUM 9.0 9.3 9.2  MG -- -- --  PHOS 5.3* 4.4 5.4*    Liver Function Tests:  Lab 08/22/11 0615 08/17/11 0844 08/16/11 1358  AST -- -- --  ALT -- -- --  ALKPHOS -- -- --  BILITOT -- -- --  PROT -- -- --  ALBUMIN 1.9* 2.2* 2.0*   No results found for this basename: LIPASE:5,AMYLASE:5 in the last 168 hours No results found for this basename: AMMONIA:3 in the last 168 hours  CBC:  Lab 08/22/11 0629 08/19/11 1200 08/17/11 0844 08/16/11 1358  WBC 15.1* 23.9* 32.2* 25.6*  NEUTROABS -- -- -- --  HGB 7.2* 8.1* 8.6* 9.3*  HCT 22.5* 25.5* 27.2* 28.8*  MCV 89.6 88.2 87.5 87.8  PLT 206 237 225 197    Cardiac Enzymes: No results found for this basename: CKTOTAL:5,CKMB:5,CKMBINDEX:5,TROPONINI:5 in the last 168 hours  BNP: No components found with this basename: POCBNP:5  CBG:  Lab 08/16/11 1742  08/16/11 1151  GLUCAP 77 100*    Microbiology: Results for orders placed during the hospital encounter of 08/13/11  CULTURE, BLOOD (ROUTINE X 2)     Status: Normal   Collection Time   08/13/11  3:15 PM      Component Value Range Status Comment   Specimen Description BLOOD CENTRAL LINE   Final    Special Requests BOTTLES DRAWN AEROBIC AND ANAEROBIC   Final    Culture  Setup Time [id number]   Final    Culture     Final    Value: DIPHTHEROIDS(CORYNEBACTERIUM SPECIES)     Note: Standardized susceptibility testing for this organism is not available.     Note: Gram Stain Report Called to,Read Back By and Verified With: GENIVEVE WROBLEWSKI @1150  08/15/11 BY KRAWS   Report Status 08/16/2011 FINAL   Final   CULTURE, BLOOD (ROUTINE X 2)     Status: Normal   Collection Time   08/13/11  3:50 PM      Component Value Range Status Comment   Specimen Description BLOOD HAND RIGHT   Final    Special Requests BOTTLES DRAWN AEROBIC ONLY [facility]   Final    Culture  Setup Time [id number]   Final    Culture  NO GROWTH 5 DAYS   Final    Report Status 08/20/2011 FINAL   Final   MRSA PCR SCREENING     Status: Normal   Collection Time   08/13/11  6:50 PM      Component Value Range Status Comment   MRSA by PCR NEGATIVE  NEGATIVE  Final   CLOSTRIDIUM DIFFICILE BY PCR     Status: Abnormal   Collection Time   08/13/11 10:46 PM      Component Value Range Status Comment   C difficile by pcr POSITIVE (*) NEGATIVE  Final   BODY FLUID CULTURE     Status: Normal   Collection Time   08/15/11  5:58 PM      Component Value Range Status Comment   Specimen Description PLEURAL FLUID RIGHT   Final    Special Requests NONE   Final    Gram Stain     Final    Value: RARE WBC PRESENT,BOTH PMN AND MONONUCLEAR     NO ORGANISMS SEEN   Culture NO GROWTH 3 DAYS   Final    Report Status 08/19/2011 FINAL   Final     Coagulation Studies: No results found for this basename: LABPROT:5,INR:5 in the last 72  hours  Urinalysis: No results found for this basename: COLORURINE:2,APPERANCEUR:2,LABSPEC:2,PHURINE:2,GLUCOSEU:2,HGBUR:2,BILIRUBINUR:2,KETONESUR:2,PROTEINUR:2,UROBILINOGEN:2,NITRITE:2,LEUKOCYTESUR:2 in the last 72 hours    Imaging: No results found.   Medications:        . aspirin EC  81 mg Oral Daily  . ceFEPime (MAXIPIME) IV  1 g Intravenous Q24H  . darbepoetin (ARANESP) injection - DIALYSIS  60 mcg Intravenous Q Mon-HD  . famotidine  40 mg Oral QHS  . ferric gluconate (FERRLECIT/NULECIT) IV  125 mg Intravenous Q M,W,F-HD  . metoprolol tartrate  12.5 mg Oral BID  . metroNIDAZOLE  500 mg Oral Q8H  . sodium chloride  25 mL/kg Intravenous Once  . vancomycin  250 mg Oral Q6H   acetaminophen, heparin, metoprolol  Assessment/ Plan:  1. ESRD / usual MWF [place]- L upper arm AVF is clotted.temporary dialysis catheter. VVS consult for new AVF consulted 2. GI- stable 3. Leukocytosis-CDiff and iscemic limb appears chronic no overt infection 4. Lytic lesion right pelvis and abnormal appearance on CT of both kidneys - will need w/u eventually 5. Peripheral Artery Disease - keep eye on left toe 6. Anemia - no idea of baseline Hb and cannot get info from HD unit. Check iron studies and start Darbe gave 60 yesterday - since still cannot get hold of his unit will start 60/week. 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 AFib/flutter - per primary 9. CDiff positive-on vanco and flagyl Received dialysis Friday      LOS: 10 [name] W @TODAY @10 :28 AM

## 2011-08-23 NOTE — Progress Notes (Signed)
Received panic hgb  Of 6.6. MD had already ordered for pt to receive 2 units prbcs and pt receiving at present time

## 2011-08-23 NOTE — Progress Notes (Signed)
   CARE MANAGEMENT NOTE 08/23/2011  Patient:  HAVEN, FOSS   Account Number:  [id number]  Date Initiated:  08/14/2011  Documentation initiated by:  [facility]  Subjective/Objective Assessment:   Admitted with sepsis - found down.  Lives alone.  HD patient     Action/Plan:   Anticipated DC Date:  08/14/2011   Anticipated DC Plan:  HOME/SELF CARE      DC Planning Services  CM consult      [facility] Choice  HOME HEALTH   Choice offered to / List presented to:  C-5 Sibling        HH arranged  HH-1 RN  HH-2 PT  HH-3 OT  HH-4 NURSE'S AIDE      HH agency  [facility]   Status of service:  Completed, signed off Medicare Important Message given?   (If response is "NO", the following Medicare IM given date fields will be blank) Date Medicare IM given:   Date Additional Medicare IM given:    Discharge Disposition:  HOME W HOME HEALTH SERVICES  Per UR Regulation:  Reviewed for med. necessity/level of care/duration of stay  If discussed at Long Length of Stay Meetings, dates discussed:   08/20/2011    Comments:  08/23/2011 1430 Contacted [facility] to make aware of pt's scheduled d/c today. Isidoro Donning RN CCM Case Mgmt phone [phone]  Contact listes as Lyonel Morejon - sister - at [phone] -  08/22/2011 [address] RN, [place] [phone] Per Dr. Arthor Captain plan discharge to home with RN, PT, OT and aide. The patient will be going to live with his sister upon discharge. The patient to continue HD as outpatient.  Spoke with Tobi Bastos Renal PA to advise of plan discharge to home and HD at [facility].  Call to sister Jimmie: [phone] Discussed plan to discharge tomorrow, verified patient to live with her upon discharge. She noted the patient will be staying with her at [address] [facility] [address], [place]     Phone: cell # [phone]   home: [phone] Discussed choice for home care agency to provide RN, PT, OT and aide. [facility] selected. They can provide transportation to the  dialysis center.  [facility] Debbie: [phone] referral for home RN, PT, OT, aide.  08-20-11 [address], [place] [phone] Pt was discussed in the long length of stay meeting today.   08-14-11 1:22pm Avie Arenas, RNBSN [phone] UR Completed - attempted to  call contact number - a male voice answered stated this is a contact number - it is work - and they will give him a message to call.  08-14-11 2:52pm Avie Arenas, RNBSN [phone] Sister - Jimmie on unit in patients room.  States patient used to live with her.  Has been on own since March - can live again with sister if needs to on discharge.  Has listed wife as contact - [phone] sister states legally separated.   Also listed is Ronney Asters - [phone] -   SW notified to identify contact and legal next of kin. Staff state anther male by earlier - identified as cousisn - stating she is only next of kin???

## 2011-08-23 NOTE — Progress Notes (Signed)
Discusses discharge instructions and medications with patient. All questions answered. Steele Berg RN

## 2011-08-24 LAB — TYPE AND SCREEN
ABO/RH(D): O POS
Antibody Screen: NEGATIVE
Unit division: 0

## 2011-08-26 ENCOUNTER — Emergency Department (HOSPITAL_COMMUNITY): Payer: Medicare Other

## 2011-08-26 ENCOUNTER — Inpatient Hospital Stay (HOSPITAL_COMMUNITY)
Admission: EM | Admit: 2011-08-26 | Discharge: 2011-09-16 | DRG: 853 | Disposition: A | Discharge status: Other Acute Inpatient Hospital | Payer: Medicare Other | Attending: Family Medicine | Admitting: Family Medicine

## 2011-08-26 ENCOUNTER — Telehealth: Payer: Self-pay | Admitting: Surgery

## 2011-08-26 ENCOUNTER — Encounter (HOSPITAL_COMMUNITY): Payer: Self-pay | Admitting: Emergency Medicine

## 2011-08-26 DIAGNOSIS — K56609 Unspecified intestinal obstruction, unspecified as to partial versus complete obstruction: Secondary | ICD-10-CM

## 2011-08-26 DIAGNOSIS — I5032 Chronic diastolic (congestive) heart failure: Secondary | ICD-10-CM | POA: Diagnosis present

## 2011-08-26 DIAGNOSIS — G9340 Encephalopathy, unspecified: Secondary | ICD-10-CM

## 2011-08-26 DIAGNOSIS — G9341 Metabolic encephalopathy: Secondary | ICD-10-CM | POA: Diagnosis present

## 2011-08-26 DIAGNOSIS — N186 End stage renal disease: Secondary | ICD-10-CM | POA: Diagnosis present

## 2011-08-26 DIAGNOSIS — A0472 Enterocolitis due to Clostridium difficile, not specified as recurrent: Secondary | ICD-10-CM | POA: Diagnosis present

## 2011-08-26 DIAGNOSIS — N2581 Secondary hyperparathyroidism of renal origin: Secondary | ICD-10-CM | POA: Diagnosis present

## 2011-08-26 DIAGNOSIS — R6521 Severe sepsis with septic shock: Secondary | ICD-10-CM

## 2011-08-26 DIAGNOSIS — Z992 Dependence on renal dialysis: Secondary | ICD-10-CM

## 2011-08-26 DIAGNOSIS — I4891 Unspecified atrial fibrillation: Secondary | ICD-10-CM

## 2011-08-26 DIAGNOSIS — K5900 Constipation, unspecified: Secondary | ICD-10-CM | POA: Diagnosis present

## 2011-08-26 DIAGNOSIS — R0603 Acute respiratory distress: Secondary | ICD-10-CM

## 2011-08-26 DIAGNOSIS — R6520 Severe sepsis without septic shock: Secondary | ICD-10-CM

## 2011-08-26 DIAGNOSIS — I998 Other disorder of circulatory system: Secondary | ICD-10-CM | POA: Diagnosis present

## 2011-08-26 DIAGNOSIS — J690 Pneumonitis due to inhalation of food and vomit: Secondary | ICD-10-CM | POA: Diagnosis present

## 2011-08-26 DIAGNOSIS — J189 Pneumonia, unspecified organism: Secondary | ICD-10-CM | POA: Diagnosis present

## 2011-08-26 DIAGNOSIS — R5381 Other malaise: Secondary | ICD-10-CM | POA: Diagnosis present

## 2011-08-26 DIAGNOSIS — I2510 Atherosclerotic heart disease of native coronary artery without angina pectoris: Secondary | ICD-10-CM | POA: Diagnosis present

## 2011-08-26 DIAGNOSIS — I70219 Atherosclerosis of native arteries of extremities with intermittent claudication, unspecified extremity: Secondary | ICD-10-CM

## 2011-08-26 DIAGNOSIS — D62 Acute posthemorrhagic anemia: Secondary | ICD-10-CM | POA: Diagnosis not present

## 2011-08-26 DIAGNOSIS — I509 Heart failure, unspecified: Secondary | ICD-10-CM | POA: Diagnosis present

## 2011-08-26 DIAGNOSIS — Z87891 Personal history of nicotine dependence: Secondary | ICD-10-CM

## 2011-08-26 DIAGNOSIS — J90 Pleural effusion, not elsewhere classified: Secondary | ICD-10-CM

## 2011-08-26 DIAGNOSIS — E785 Hyperlipidemia, unspecified: Secondary | ICD-10-CM

## 2011-08-26 DIAGNOSIS — N189 Chronic kidney disease, unspecified: Secondary | ICD-10-CM | POA: Diagnosis present

## 2011-08-26 DIAGNOSIS — Z7982 Long term (current) use of aspirin: Secondary | ICD-10-CM

## 2011-08-26 DIAGNOSIS — I70269 Atherosclerosis of native arteries of extremities with gangrene, unspecified extremity: Secondary | ICD-10-CM | POA: Diagnosis present

## 2011-08-26 DIAGNOSIS — I959 Hypotension, unspecified: Secondary | ICD-10-CM

## 2011-08-26 DIAGNOSIS — R4182 Altered mental status, unspecified: Secondary | ICD-10-CM

## 2011-08-26 DIAGNOSIS — D631 Anemia in chronic kidney disease: Secondary | ICD-10-CM | POA: Diagnosis present

## 2011-08-26 DIAGNOSIS — E162 Hypoglycemia, unspecified: Secondary | ICD-10-CM | POA: Diagnosis not present

## 2011-08-26 DIAGNOSIS — I10 Essential (primary) hypertension: Secondary | ICD-10-CM

## 2011-08-26 DIAGNOSIS — J9601 Acute respiratory failure with hypoxia: Secondary | ICD-10-CM

## 2011-08-26 DIAGNOSIS — I739 Peripheral vascular disease, unspecified: Secondary | ICD-10-CM | POA: Diagnosis present

## 2011-08-26 DIAGNOSIS — J9600 Acute respiratory failure, unspecified whether with hypoxia or hypercapnia: Secondary | ICD-10-CM | POA: Diagnosis present

## 2011-08-26 DIAGNOSIS — E876 Hypokalemia: Secondary | ICD-10-CM | POA: Diagnosis present

## 2011-08-26 DIAGNOSIS — A419 Sepsis, unspecified organism: Principal | ICD-10-CM | POA: Diagnosis present

## 2011-08-26 LAB — POCT I-STAT, CHEM 8
BUN: 25 mg/dL — ABNORMAL HIGH (ref 6–23)
Creatinine, Ser: 5.8 mg/dL — ABNORMAL HIGH (ref 0.50–1.35)
Potassium: 3.2 mEq/L — ABNORMAL LOW (ref 3.5–5.1)
Sodium: 139 mEq/L (ref 135–145)

## 2011-08-26 LAB — CBC
MCH: 28.3 pg (ref 26.0–34.0)
MCHC: 30.9 g/dL (ref 30.0–36.0)
MCV: 91.4 fL (ref 78.0–100.0)
Platelets: 280 10*3/uL (ref 150–400)
RDW: 17.8 % — ABNORMAL HIGH (ref 11.5–15.5)

## 2011-08-26 LAB — COMPREHENSIVE METABOLIC PANEL
ALT: 10 U/L (ref 0–53)
Calcium: 9.6 mg/dL (ref 8.4–10.5)
GFR calc Af Amer: 12 mL/min — ABNORMAL LOW (ref >90)
Glucose, Bld: 139 mg/dL — ABNORMAL HIGH (ref 70–99)
Sodium: 139 mEq/L (ref 135–145)
Total Protein: 6.1 g/dL (ref 6.0–8.3)

## 2011-08-26 LAB — DIFFERENTIAL
Basophils Absolute: 0.1 10*3/uL (ref 0.0–0.1)
Basophils Relative: 0 % (ref 0–1)
Eosinophils Absolute: 0 10*3/uL (ref 0.0–0.7)
Eosinophils Relative: 0 % (ref 0–5)
Lymphs Abs: 0.6 10*3/uL — ABNORMAL LOW (ref 0.7–4.0)

## 2011-08-26 LAB — POCT I-STAT TROPONIN I

## 2011-08-26 MED ORDER — ROCURONIUM BROMIDE 50 MG/5ML IV SOLN
INTRAVENOUS | Status: AC
  Filled 2011-08-26: qty 2

## 2011-08-26 MED ORDER — ETOMIDATE 2 MG/ML IV SOLN
INTRAVENOUS | Status: AC
  Administered 2011-08-26: 20 mg via INTRAVENOUS
  Filled 2011-08-26: qty 20

## 2011-08-26 MED ORDER — ROCURONIUM BROMIDE 50 MG/5ML IV SOLN
INTRAVENOUS | Status: AC
  Administered 2011-08-26: 50 mg via INTRAVENOUS
  Filled 2011-08-26: qty 2

## 2011-08-26 MED ORDER — LIDOCAINE HCL (CARDIAC) 20 MG/ML IV SOLN
INTRAVENOUS | Status: AC
  Filled 2011-08-26: qty 5

## 2011-08-26 MED ORDER — FENTANYL BOLUS VIA INFUSION
50.0000 ug | Freq: Four times a day (QID) | INTRAVENOUS | Status: DC | PRN
  Filled 2011-08-26: qty 100

## 2011-08-26 MED ORDER — SODIUM CHLORIDE 0.9 % IV SOLN
50.0000 ug/h | INTRAVENOUS | Status: DC
  Administered 2011-08-26: 50 ug/h via INTRAVENOUS
  Filled 2011-08-26: qty 50

## 2011-08-26 MED ORDER — SUCCINYLCHOLINE CHLORIDE 20 MG/ML IJ SOLN
INTRAMUSCULAR | Status: AC
  Filled 2011-08-26: qty 10

## 2011-08-26 MED ORDER — ETOMIDATE 2 MG/ML IV SOLN
INTRAVENOUS | Status: AC
  Filled 2011-08-26: qty 20

## 2011-08-26 MED ORDER — MIDAZOLAM BOLUS VIA INFUSION
1.0000 mg | INTRAVENOUS | Status: DC | PRN
  Filled 2011-08-26: qty 2

## 2011-08-26 MED ORDER — SODIUM CHLORIDE 0.9 % IV SOLN
2.0000 mg/h | INTRAVENOUS | Status: DC
  Administered 2011-08-26: 4 mg/h via INTRAVENOUS
  Filled 2011-08-26: qty 10

## 2011-08-26 NOTE — ED Notes (Signed)
500 cc bolus going in.

## 2011-08-26 NOTE — ED Notes (Signed)
Per EMS- pt c/o sob and weakness. Pt reports he was having a hard time breathing and is a dialysis patient. Goes to dialysis MWF. NKA. Last seen normal was may 20th, according to neighbor.

## 2011-08-26 NOTE — Telephone Encounter (Addendum)
Message copied by Rosalyn Charters on Tue Aug 26, 2011  4:09 PM ------      Message from: Phillips Odor      Created: Tue Aug 26, 2011  1:49 PM      Regarding: needs cardiology appt for clearance pre-op fem-pop bypass      Contact: [phone]       Per Dr. Myra Gianotti, pt needs cardiac clearance, and to get rescheduled for his left fem-pop bypass surgery.  Pt. States he thinks he [place], but doesn't remember name.  Also, pt. Dialyzes in [place] on M-W-F.  Please sched a cardiology appt on a Tues. or Thurs.  Alternate number if you can't reach pt. Is [phone] (sister-Jimmie)  Can you let me know of appt., so I can follow up w/ that office, to get surgery scheduled?   NOTIFIED SISTER, JIMMIE, OF APPT. WITH [facility], SCOTT WEAVER, ON 09-02-11 2:20PM

## 2011-08-26 NOTE — ED Notes (Signed)
PT sedated; blood being drawn. 2 L IV NS bolusing in.

## 2011-08-26 NOTE — ED Notes (Signed)
NG tube inserted; initial suction of green, brown material  500 cc.

## 2011-08-27 ENCOUNTER — Other Ambulatory Visit: Payer: Self-pay

## 2011-08-27 ENCOUNTER — Inpatient Hospital Stay (HOSPITAL_COMMUNITY): Payer: Medicare Other

## 2011-08-27 DIAGNOSIS — K5900 Constipation, unspecified: Secondary | ICD-10-CM | POA: Insufficient documentation

## 2011-08-27 DIAGNOSIS — J9600 Acute respiratory failure, unspecified whether with hypoxia or hypercapnia: Secondary | ICD-10-CM

## 2011-08-27 DIAGNOSIS — A419 Sepsis, unspecified organism: Principal | ICD-10-CM | POA: Diagnosis present

## 2011-08-27 DIAGNOSIS — J9601 Acute respiratory failure with hypoxia: Secondary | ICD-10-CM | POA: Diagnosis present

## 2011-08-27 DIAGNOSIS — K56609 Unspecified intestinal obstruction, unspecified as to partial versus complete obstruction: Secondary | ICD-10-CM

## 2011-08-27 DIAGNOSIS — G9340 Encephalopathy, unspecified: Secondary | ICD-10-CM

## 2011-08-27 DIAGNOSIS — G9341 Metabolic encephalopathy: Secondary | ICD-10-CM | POA: Diagnosis present

## 2011-08-27 DIAGNOSIS — Z01818 Encounter for other preprocedural examination: Secondary | ICD-10-CM

## 2011-08-27 DIAGNOSIS — R6520 Severe sepsis without septic shock: Secondary | ICD-10-CM

## 2011-08-27 DIAGNOSIS — Y95 Nosocomial condition: Secondary | ICD-10-CM | POA: Diagnosis present

## 2011-08-27 DIAGNOSIS — A0472 Enterocolitis due to Clostridium difficile, not specified as recurrent: Secondary | ICD-10-CM

## 2011-08-27 DIAGNOSIS — R112 Nausea with vomiting, unspecified: Secondary | ICD-10-CM | POA: Insufficient documentation

## 2011-08-27 DIAGNOSIS — R6521 Severe sepsis with septic shock: Secondary | ICD-10-CM | POA: Diagnosis present

## 2011-08-27 DIAGNOSIS — R4182 Altered mental status, unspecified: Secondary | ICD-10-CM

## 2011-08-27 LAB — CARBOXYHEMOGLOBIN
Carboxyhemoglobin: 1.1 % (ref 0.5–1.5)
Carboxyhemoglobin: 1.3 % (ref 0.5–1.5)
Carboxyhemoglobin: 1.6 % — ABNORMAL HIGH (ref 0.5–1.5)
Methemoglobin: 1 % (ref 0.0–1.5)
Methemoglobin: 1 % (ref 0.0–1.5)
Methemoglobin: 1.1 % (ref 0.0–1.5)
Methemoglobin: 1.1 % (ref 0.0–1.5)
Total hemoglobin: 10.6 g/dL — ABNORMAL LOW (ref 13.5–18.0)
Total hemoglobin: 7.2 g/dL — ABNORMAL LOW (ref 13.5–18.0)
Total hemoglobin: 8.5 g/dL — ABNORMAL LOW (ref 13.5–18.0)
Total hemoglobin: 9.4 g/dL — ABNORMAL LOW (ref 13.5–18.0)

## 2011-08-27 LAB — CARDIAC PANEL(CRET KIN+CKTOT+MB+TROPI)
CK, MB: 3.2 ng/mL (ref 0.3–4.0)
CK, MB: 3.3 ng/mL (ref 0.3–4.0)
CK, MB: 2 ng/mL (ref 0.3–4.0)
Relative Index: INVALID (ref 0.0–2.5)
Total CK: 56 U/L (ref 7–232)
Total CK: 28 U/L (ref 7–232)
Troponin I: 0.44 ng/mL (ref <0.30)
Troponin I: 0.71 ng/mL (ref <0.30)
Troponin I: 0.77 ng/mL (ref <0.30)

## 2011-08-27 LAB — BASIC METABOLIC PANEL
CO2: 26 mEq/L (ref 19–32)
CO2: 25 mEq/L (ref 19–32)
Calcium: 8.3 mg/dL — ABNORMAL LOW (ref 8.4–10.5)
Chloride: 102 mEq/L (ref 96–112)
Chloride: 101 mEq/L (ref 96–112)
Potassium: 4.3 mEq/L (ref 3.5–5.1)
Sodium: 141 mEq/L (ref 135–145)
Sodium: 139 mEq/L (ref 135–145)

## 2011-08-27 LAB — PROCALCITONIN
Procalcitonin: 15.17 ng/mL
Procalcitonin: 12.92 ng/mL

## 2011-08-27 LAB — CBC
Hemoglobin: 8.1 g/dL — ABNORMAL LOW (ref 13.0–17.0)
Platelets: 230 10*3/uL (ref 150–400)
RBC: 2.89 MIL/uL — ABNORMAL LOW (ref 4.22–5.81)
WBC: 5.1 10*3/uL (ref 4.0–10.5)

## 2011-08-27 LAB — POCT I-STAT 3, ART BLOOD GAS (G3+)
pCO2 arterial: 50.1 mmHg — ABNORMAL HIGH (ref 35.0–45.0)
pH, Arterial: 7.401 (ref 7.350–7.450)

## 2011-08-27 LAB — GLUCOSE, CAPILLARY
Glucose-Capillary: 63 mg/dL — ABNORMAL LOW (ref 70–99)
Glucose-Capillary: 114 mg/dL — ABNORMAL HIGH (ref 70–99)
Glucose-Capillary: 101 mg/dL — ABNORMAL HIGH (ref 70–99)
Glucose-Capillary: 95 mg/dL (ref 70–99)

## 2011-08-27 LAB — PROTIME-INR
INR: 1.38 (ref 0.00–1.49)
Prothrombin Time: 17.2 seconds — ABNORMAL HIGH (ref 11.6–15.2)

## 2011-08-27 LAB — CORTISOL: Cortisol, Plasma: 21.5 ug/dL

## 2011-08-27 LAB — LACTIC ACID, PLASMA: Lactic Acid, Venous: 2 mmol/L (ref 0.5–2.2)

## 2011-08-27 MED ORDER — VANCOMYCIN HCL IN DEXTROSE 1-5 GM/200ML-% IV SOLN
1000.0000 mg | Freq: Once | INTRAVENOUS | Status: AC
  Administered 2011-08-27: 1000 mg via INTRAVENOUS
  Filled 2011-08-27: qty 200

## 2011-08-27 MED ORDER — FAMOTIDINE IN NACL 20-0.9 MG/50ML-% IV SOLN
20.0000 mg | Freq: Two times a day (BID) | INTRAVENOUS | Status: DC
  Administered 2011-08-27 – 2011-08-28 (×5): 20 mg via INTRAVENOUS
  Filled 2011-08-27 (×7): qty 50

## 2011-08-27 MED ORDER — VANCOMYCIN HCL 1000 MG IV SOLR
750.0000 mg | INTRAVENOUS | Status: DC
  Filled 2011-08-27: qty 750

## 2011-08-27 MED ORDER — IOHEXOL 300 MG/ML  SOLN
100.0000 mL | Freq: Once | INTRAMUSCULAR | Status: AC | PRN
  Administered 2011-08-27: 100 mL via INTRAVENOUS

## 2011-08-27 MED ORDER — VANCOMYCIN 50 MG/ML ORAL SOLUTION
250.0000 mg | Freq: Four times a day (QID) | ORAL | Status: DC
  Filled 2011-08-27 (×5): qty 5

## 2011-08-27 MED ORDER — POTASSIUM CHLORIDE 10 MEQ/50ML IV SOLN
INTRAVENOUS | Status: AC
  Filled 2011-08-27: qty 50

## 2011-08-27 MED ORDER — SODIUM CHLORIDE 0.9 % IV SOLN
500.0000 mg | INTRAVENOUS | Status: AC
  Administered 2011-08-27: 500 mg via INTRAVENOUS
  Filled 2011-08-27: qty 500

## 2011-08-27 MED ORDER — IOHEXOL 300 MG/ML  SOLN
40.0000 mL | Freq: Once | INTRAMUSCULAR | Status: AC | PRN
  Administered 2011-08-27: 40 mL via ORAL

## 2011-08-27 MED ORDER — SODIUM CHLORIDE 0.9 % IV BOLUS (SEPSIS): 500.0000 mL | INTRAVENOUS | Status: DC | PRN

## 2011-08-27 MED ORDER — VANCOMYCIN HCL 500 MG IV SOLR
500.0000 mg | Freq: Three times a day (TID) | Status: DC
  Administered 2011-08-27 – 2011-08-28 (×3): 500 mg via RECTAL
  Filled 2011-08-27 (×10): qty 500

## 2011-08-27 MED ORDER — METRONIDAZOLE IN NACL 5-0.79 MG/ML-% IV SOLN
500.0000 mg | Freq: Once | INTRAVENOUS | Status: AC
  Administered 2011-08-27: 500 mg via INTRAVENOUS
  Filled 2011-08-27: qty 100

## 2011-08-27 MED ORDER — DOBUTAMINE IN D5W 4-5 MG/ML-% IV SOLN
2.5000 ug/kg/min | INTRAVENOUS | Status: DC | PRN
  Administered 2011-08-27: 2.5 ug/kg/min via INTRAVENOUS
  Filled 2011-08-27: qty 250

## 2011-08-27 MED ORDER — CHLORHEXIDINE GLUCONATE 0.12 % MT SOLN
OROMUCOSAL | Status: AC
  Administered 2011-08-27: 15 mL
  Filled 2011-08-27: qty 15

## 2011-08-27 MED ORDER — SODIUM CHLORIDE 0.9 % IV SOLN
250.0000 mL | INTRAVENOUS | Status: DC | PRN
  Administered 2011-08-30 – 2011-08-31 (×2): 250 mL via INTRAVENOUS

## 2011-08-27 MED ORDER — SODIUM CHLORIDE 0.9 % IV SOLN
250.0000 mg | Freq: Two times a day (BID) | INTRAVENOUS | Status: AC
  Administered 2011-08-27 – 2011-09-03 (×13): 250 mg via INTRAVENOUS
  Filled 2011-08-27 (×15): qty 250

## 2011-08-27 MED ORDER — SODIUM CHLORIDE 0.9 % IV SOLN
INTRAVENOUS | Status: DC
  Administered 2011-08-27: 03:00:00 via INTRAVENOUS

## 2011-08-27 MED ORDER — NOREPINEPHRINE BITARTRATE 1 MG/ML IJ SOLN
2.0000 ug/min | INTRAVENOUS | Status: DC | PRN
  Administered 2011-08-27: 10 ug/min via INTRAVENOUS
  Filled 2011-08-27 (×2): qty 16

## 2011-08-27 MED ORDER — SODIUM CHLORIDE 0.9 % IV SOLN
500.0000 mg | Freq: Once | INTRAVENOUS | Status: AC
  Administered 2011-08-27: 500 mg via INTRAVENOUS
  Filled 2011-08-27: qty 500

## 2011-08-27 MED ORDER — SODIUM CHLORIDE 0.9 % IV SOLN
0.0300 [IU]/min | INTRAVENOUS | Status: DC | PRN
  Administered 2011-08-27: 0.03 [IU]/min via INTRAVENOUS
  Filled 2011-08-27: qty 2.5

## 2011-08-27 MED ORDER — POTASSIUM CHLORIDE 10 MEQ/50ML IV SOLN
10.0000 meq | INTRAVENOUS | Status: AC
  Administered 2011-08-27 (×4): 10 meq via INTRAVENOUS
  Filled 2011-08-27: qty 150
  Filled 2011-08-27: qty 50

## 2011-08-27 MED ORDER — METRONIDAZOLE IN NACL 5-0.79 MG/ML-% IV SOLN
500.0000 mg | Freq: Three times a day (TID) | INTRAVENOUS | Status: DC
  Administered 2011-08-27 – 2011-09-06 (×29): 500 mg via INTRAVENOUS
  Filled 2011-08-27 (×33): qty 100

## 2011-08-27 MED ORDER — SODIUM CHLORIDE 0.9 % IV SOLN
2.0000 mg/h | INTRAVENOUS | Status: DC
  Administered 2011-08-27: 1 mg/h via INTRAVENOUS
  Filled 2011-08-27: qty 10

## 2011-08-27 MED ORDER — SODIUM CHLORIDE 0.9 % IV SOLN
50.0000 ug/h | INTRAVENOUS | Status: DC
  Filled 2011-08-27: qty 50

## 2011-08-27 MED ORDER — SODIUM CHLORIDE 0.9 % IV BOLUS (SEPSIS)
500.0000 mL | Freq: Once | INTRAVENOUS | Status: AC
  Administered 2011-08-27: 500 mL via INTRAVENOUS

## 2011-08-27 MED ORDER — FENTANYL BOLUS VIA INFUSION
50.0000 ug | Freq: Four times a day (QID) | INTRAVENOUS | Status: DC | PRN
  Filled 2011-08-27: qty 100

## 2011-08-27 MED ORDER — METRONIDAZOLE IN NACL 5-0.79 MG/ML-% IV SOLN
INTRAVENOUS | Status: AC
  Filled 2011-08-27: qty 100

## 2011-08-27 MED ORDER — MIDAZOLAM HCL 2 MG/2ML IJ SOLN: 2.0000 mg | INTRAMUSCULAR | Status: DC | PRN

## 2011-08-27 MED ORDER — SODIUM CHLORIDE 0.9 % IV BOLUS (SEPSIS)
25.0000 mL/kg | Freq: Once | INTRAVENOUS | Status: AC
  Administered 2011-08-27: 1675 mL via INTRAVENOUS

## 2011-08-27 MED ORDER — EPINEPHRINE HCL 0.1 MG/ML IJ SOLN
INTRAMUSCULAR | Status: AC
  Filled 2011-08-27: qty 10

## 2011-08-27 NOTE — H&P (Signed)
Name: [name] MRN: [id number] DOB: 1950/04/03    LOS: 1  Referring Provider:  Norlene Campbell EDP Reason for Referral:  Severe Sepsis, Respiratory failure  PULMONARY / CRITICAL CARE MEDICINE  HPI:  This is a 61 y/o male with peripheral vascular disease and ESRD who was recently hospitalized for severe sepsis due to c.diff who was admitted through the [facility] ED on 08/26/11 with respiratory failure and sepsis.  His family states that he had been doing well since his d/c home on 6/1, but developed constipation on 6/3.  On 6/4 he developed nausea and vomiting in the evening followed by respiratory distress and confusion.  He was intubated in the ED for respiratory failure.  He last went to HD on 6/3.  Past Medical History  Diagnosis Date  . Hyperlipidemia   . ESRD (end stage renal disease)     a. MWF dialysis in [place] (followed by Dr. Kristian Covey)  . Arthritis   . Claudication   . Stroke 2005  . Anemia   . Chronic diastolic CHF (congestive heart failure) 07/2010    a. 07/2008 Echo EF 50-55%, mild-mod LVH, Gr 1 DD.  Marland Kitchen Peripheral vascular occlusive disease     a. 07/2011 Periph Angio: No signif Ao-illiac dzs, LCF stenosis, 100% LSFA w recon above knee pop and 3 vessel runoff, 100% RSFA w/ recon above knee --> pending L Fem to below Knee pop bypass.  Marland Kitchen History of tobacco abuse   . Hypertension     takes Amlodipine,Metoprolol,and Prinivil daily  . Dyspnea on exertion     with exertion   Past Surgical History  Procedure Date  . Arteriovenous graft placement   . Av fistula placement 12/10/2000    Right brachiocephalic arteriovenous   . Hernia repair   . Aortagram 07/29/2011    Abdominal Aortagram  . Cardiac catheterization 08/01/11    Left heart catheterization   Prior to Admission medications   Medication Sig Start Date End Date Taking? Authorizing Provider  amLODipine (NORVASC) 10 MG tablet Take 10 mg by mouth daily.   Yes Historical Provider, MD  aspirin EC 81 MG tablet Take 1 tablet (81 mg  total) by mouth daily. 07/31/11 07/30/12 Yes Ok Anis, NP  calcitRIOL (ROCALTROL) 0.25 MCG capsule Take 0.25 mcg by mouth daily.   Yes Historical Provider, MD  calcium acetate (PHOSLO) 667 MG capsule Take 667-1,334 mg by mouth 3 (three) times daily with meals. 2 caps with each meal, 1 cap with snacks   Yes Historical Provider, MD  cinacalcet (SENSIPAR) 30 MG tablet Take 30 mg by mouth at bedtime.   Yes Historical Provider, MD  gabapentin (NEURONTIN) 100 MG capsule Take 100 mg by mouth 2 (two) times daily.   Yes Historical Provider, MD  isosorbide mononitrate (IMDUR) 30 MG 24 hr tablet Take 30 mg by mouth daily.   Yes Historical Provider, MD  lanthanum (FOSRENOL) 1000 MG chewable tablet Chew 1,000-2,000 mg by mouth 3 (three) times daily with meals. 2 tabs with each meal, 1 tab with snacks   Yes Historical Provider, MD  lisinopril (PRINIVIL,ZESTRIL) 30 MG tablet Take 30 mg by mouth daily.   Yes Historical Provider, MD  metoprolol succinate (TOPROL-XL) 100 MG 24 hr tablet Take 100 mg by mouth 2 (two) times daily. Take with or immediately following a meal.   Yes Historical Provider, MD  metroNIDAZOLE (FLAGYL) 500 MG tablet Take 1 tablet (500 mg total) by mouth every 8 (eight) hours. 08/23/11 09/02/11 Yes Clydia Llano, MD  multivitamin (RENA-VIT) TABS tablet Take 1 tablet by mouth daily.   Yes Historical Provider, MD  oxyCODONE (OXY IR/ROXICODONE) 5 MG immediate release tablet Take 1-2 tablets (5-10 mg total) by mouth every 6 (six) hours as needed for pain. 08/23/11  Yes Clydia Llano, MD  sevelamer (RENVELA) 800 MG tablet Take 800-2,400 mg by mouth 3 (three) times daily with meals. Take 4 tabs with meals and 1 tab with snacks   Yes Historical Provider, MD  simvastatin (ZOCOR) 5 MG tablet Take 5 mg by mouth at bedtime.   Yes Historical Provider, MD   Allergies No Known Allergies  Family History Family History  Problem Relation Age of Onset  . Breast cancer Mother   . Cancer Father   . Anesthesia  problems Neg Hx   . Hypotension Neg Hx   . Malignant hyperthermia Neg Hx   . Pseudochol deficiency Neg Hx    Social History  reports that he quit smoking about 5 months ago. His smoking use included Cigarettes. He has a 40 pack-year smoking history. He does not have any smokeless tobacco history on file. He reports that he does not drink alcohol or use illicit drugs.  Review Of Systems:  Cannot obtain due to intubation  Brief patient description:  61 y/o male with ESRD and severe sepsis likely from aspiration pneumonia and possibly SBO vs. Perforated bowel vs. Recurrent c.diff colitis.  Events Since Admission: 6/5 CT abdomen >>  Current Status:  Vital Signs: Temp:  [97.6 F (36.4 C)] 97.6 F (36.4 C) (06/04 2248) Pulse Rate:  [82-91] 82  (06/05 0056) Resp:  [17-39] 18  (06/05 0056) BP: (76-113)/(36-64) 113/57 mmHg (06/05 0056) SpO2:  [96 %-100 %] 97 % (06/05 0056)  Physical Examination: Gen: sedated on vent, no distress HEENT: NCAT, EOMi, OP clear, ETT in place PULM: Insp crackles on L, diminished on R CV: Tachy, no mgr, no JVD AB: No bowel sounds, tender diffusely and distended Ext: cool feet bilaterally, edema noted in legs, no clubbing, no cyanosis Derm: sacral skin breakdown Neuro: sedated on vent, moves all four extremities well   Active Problems:  Chronic diastolic CHF (congestive heart failure)  ESRD (end stage renal disease)  C. difficile colitis  CAD (coronary artery disease)  Acute respiratory failure with hypoxia  Septic shock  Nausea and vomiting  Constipation  Hospital-acquired pneumonia   ASSESSMENT AND PLAN  PULMONARY  Lab 08/27/11 0001  PHART 7.401  PCO2ART 50.1*  PO2ART 284.0*  HCO3 31.1*  O2SAT 100.0   Ventilator Settings:    CXR:  6/4 Bilateral heterogeneous opacities ETT:  6/4 >>  A:  Acute respiratory failure due to likely aspiration vs. HAP P:   -full vent support, TVol 6cc/kg IBW -daily ABG/SBT/WUA/CXR  CARDIOVASCULAR  Lab  08/27/11 0008 08/26/11 2305  TROPONINI -- --  LATICACIDVEN 2.0 --  PROBNP -- 25900.0*   ECG:  NSR, IVCD, no clear ST wave changes Lines:  6/5 R Fem CVL >> 6/5 R Fem A-line >>  A: Septic shock P:  -given bolus of one liter in ED -would hold off on more fluids based on ESRD -cannot measure CVP given obstructed upper ext veins -start sepsis protocol/EGDT -see antibiotics below -rule out for MI given CAD -tele  RENAL  Lab 08/26/11 2315 08/26/11 2306 08/23/11 1100 08/22/11 0615  NA 139 139 137 135  K 3.2* 3.2* -- --  CL 95* 93* 98 96  CO2 -- 29 27 24   BUN 25* 26* 22 33*  CREATININE 5.80* 5.51* 4.87* 5.87*  CALCIUM -- 9.6 8.8 9.0  MG -- -- -- --  PHOS -- -- 4.8* 5.3*   Intake/Output    None    Foley:    A:  ESRD, last outpatient HD 6/3; no immediate HD needs 6/5 P:   -consult renal 6/5 in AM  GASTROINTESTINAL  Lab 08/26/11 2306 08/23/11 1100 08/22/11 0615  AST 22 -- --  ALT 10 -- --  ALKPHOS 73 -- --  BILITOT 0.4 -- --  PROT 6.1 -- --  ALBUMIN 2.1* 1.8* 1.9*    A:  Nausea, vomiting, constipation and abdominal exam worrisome for SBO vs. Perforated bowel, also with C.diff P:   -OG tube -CT abdomen with contrast now -surgery consult if CT abdomen indicates surgical issue  HEMATOLOGIC  Lab 08/26/11 2315 08/26/11 2306 08/23/11 1100 08/22/11 0629  HGB 10.2* 8.9* 6.6* 7.2*  HCT 30.0* 28.8* 20.9* 22.5*  PLT -- 280 218 206  INR -- -- -- --  APTT -- -- -- --   A:  Chronic anemia, no acute issues P:  -monitor CBC  INFECTIOUS  Lab 08/26/11 2329 08/26/11 2306 08/23/11 1100 08/22/11 0629  WBC -- 13.6* 15.1* 15.1*  PROCALCITON 15.17 -- -- --   Cultures: 6/4 Blood >> 6/4 tracheal culture >>  Antibiotics: 6/5 Imipenem (HAP/abdominal sepsis)>> 6/5 Vanc (HAP) >> 6/5 Oral Vanc (c.diff) >> 6/5 Flagyl (c.diff) >>  A:  Known c.diff diagnosed 07/2011 admission, plan was for flagyl to finish on 6/8 P:   -add back oral vanc -continue flagyl -would continue  for at least 7 more days after d/c IV vanc/imi  A: HAP vs. Aspiration pneumonia P: -vanc/imi -respiratory culture  ENDOCRINE No results found for this basename: GLUCAP:5 in the last 168 hours A:  No acute issues   P:   -monitor cbg  NEUROLOGIC  A:  Encephalopathy due to sepsis P:   -supportive care -fentanyl gtt on vent -prn versed -treat sepsis  BEST PRACTICE / DISPOSITION Level of Care:  ICU Primary Service:  PCCM Consultants:   Code Status:  Full per family discussion 6/4, would continue to address Diet:  NPO DVT Px:  scd GI Px:  pepcid Skin Integrity: sacral pressure wound noted on admission Social / Family:  Notified at bedside 6/4  Total CC time 60 minutes.  Max Fickle, M.D. Pulmonary and Critical Care Medicine [facility] Pager: [phone]  08/27/2011, 12:58 AM

## 2011-08-27 NOTE — Progress Notes (Signed)
Name: [name] MRN: [id number] DOB: 05-24-1950    LOS: 1  Referring Provider:  Norlene Campbell EDP Reason for Referral:  Severe Sepsis, Respiratory failure  PULMONARY / CRITICAL CARE MEDICINE  Brief patient description:  61 y/o male with ESRD and severe sepsis likely from aspiration pneumonia and possibly SBO vs. Perforated bowel vs. Recurrent c.diff colitis. Admitted 6/5  Events Since Admission: 6/5 CT abdomen >> Concerning for early closed loop obstruction with compartmentalized small bowel loops. Marked rectal wall thickening  Current Status: Remains intubated on the vent Currently on norepi, vasopressin, dobuta  Vital Signs: Temp:  [94.1 F (34.5 C)-97.6 F (36.4 C)] 94.1 F (34.5 C) (06/05 0600) Pulse Rate:  [71-95] 95  (06/05 0700) Resp:  [17-39] 30  (06/05 0700) BP: (43-137)/(14-80) 137/80 mmHg (06/05 0420) SpO2:  [90 %-100 %] 91 % (06/05 0700) Arterial Line BP: (79-147)/(60-76) 126/74 mmHg (06/05 0515) FiO2 (%):  [50 %-100 %] 80.1 % (06/05 0700) Weight:  [67 kg (147 lb 11.3 oz)-71.1 kg (156 lb 12 oz)] 71.1 kg (156 lb 12 oz) (06/05 0600)  Physical Examination: Gen: sedated on vent, no distress HEENT: NCAT, EOMi, OP clear, ETT in place PULM: Insp crackles on L, diminished on R CV: Tachy, no mgr, no JVD AB: No bowel sounds, tender diffusely and distended Ext: cool feet bilaterally, edema noted in legs, no clubbing, no cyanosis Derm: sacral skin breakdown Neuro: sedated on vent, moves all four extremities well   Principal Problem:  *Septic shock Active Problems:  Chronic diastolic CHF (congestive heart failure)  ESRD (end stage renal disease)  C. difficile colitis  CAD (coronary artery disease)  Acute respiratory failure with hypoxia  Nausea and vomiting  Constipation  Hospital-acquired pneumonia  Acute encephalopathy   ASSESSMENT AND PLAN  PULMONARY  Lab 08/27/11 0615 08/27/11 0242 08/27/11 0001  PHART -- -- 7.401  PCO2ART -- -- 50.1*  PO2ART -- --  284.0*  HCO3 -- -- 31.1*  O2SAT 53.5 55.1 100.0   Ventilator Settings:  Vent Mode:  [-] PRVC FiO2 (%):  [50 %-100 %] 80.1 % Set Rate:  [18 bmp] 18 bmp Vt Set:  [390 mL-500 mL] 390 mL PEEP:  [5 cmH20] 5 cmH20 Plateau Pressure:  [23 cmH20-28 cmH20] 28 cmH20 CXR:  6/5: increased bilateral airspace disease L > R ETT:  6/4 >>  A:  Acute respiratory failure due to likely aspiration vs. HAP P:   -full vent support, 6cc/kg -daily WUA  CARDIOVASCULAR  Lab 08/27/11 0250 08/27/11 0008 08/26/11 2305  TROPONINI 0.44* -- --  LATICACIDVEN -- 2.0 --  PROBNP -- -- 25900.0*    Lab 08/27/11 0249 08/26/11 2329  PROCALCITON 12.92 15.17   ECG:  NSR, IVCD, no clear ST wave changes Lines:  6/5 R Fem CVL >> 6/5 R Fem A-line >> L Subclavian tunneled HD cath ?>>>  A: Septic shock P:  -cannot measure CVP d/t CVL in fem.  -start sepsis protocol/EGDT -see antibiotics below -Continue to trend CE -tele  RENAL  Lab 08/27/11 0301 08/26/11 2315 08/26/11 2306 08/23/11 1100 08/22/11 0615  NA 141 139 139 137 135  K 3.0* 3.2* -- -- --  CL 102 95* 93* 98 96  CO2 26 -- 29 27 24   BUN 24* 25* 26* 22 33*  CREATININE 5.10* 5.80* 5.51* 4.87* 5.87*  CALCIUM 8.1* -- 9.6 8.8 9.0  MG -- -- -- -- --  PHOS -- -- -- 4.8* 5.3*   Intake/Output      06/04 0701 -  06/05 0700 06/05 0701 - 06/06 0700   I.V. (mL/kg) 513.2 (7.2)    Blood 15    IV Piggyback 150    Total Intake(mL/kg) 678.2 (9.5)    Net +678.2          Foley:    A:  ESRD, last outpatient HD 6/3; no immediate HD needs 6/5 P:   -notified renal that pt admitted, will likely need CVVHD  A: hypokalemia P: - replace - recheck BMP after HD today  GASTROINTESTINAL  Lab 08/26/11 2306 08/23/11 1100 08/22/11 0615  AST 22 -- --  ALT 10 -- --  ALKPHOS 73 -- --  BILITOT 0.4 -- --  PROT 6.1 -- --  ALBUMIN 2.1* 1.8* 1.9*    A:  Nausea, vomiting, constipation and abdominal exam worrisome for SBO vs. Perforated bowel, also with C.diff-- CT 6/5  shows possible closed loop obstruction P:   -OG tube -NPO -CCS consult today  HEMATOLOGIC  Lab 08/27/11 0301 08/26/11 2315 08/26/11 2306 08/23/11 1100 08/22/11 0629  HGB 8.1* 10.2* 8.9* 6.6* 7.2*  HCT 26.4* 30.0* 28.8* 20.9* 22.5*  PLT 230 -- 280 218 206  INR 1.38 -- -- -- --  APTT 35 -- -- -- --   A:  Chronic anemia, no acute issues P:  -monitor CBC  INFECTIOUS  Lab 08/27/11 0301 08/27/11 0249 08/26/11 2329 08/26/11 2306 08/23/11 1100 08/22/11 0629  WBC 5.1 -- -- 13.6* 15.1* 15.1*  PROCALCITON -- 12.92 15.17 -- -- --   Cultures: 6/4 Blood >> 6/4 tracheal culture >>  Antibiotics: 6/5 Imipenem (HAP/abdominal sepsis)>> 6/5 Vanc (HAP) >> 6/5 Oral Vanc (c.diff) >> 6/5 Flagyl (c.diff) >>  A:  Known c.diff diagnosed 07/2011 admission, plan was for flagyl to finish on 6/8 P:   -continue oral vanco -continue flagyl -would continue for at least 7 more days after d/c IV vanc/imi  A: HAP vs. Aspiration pneumonia P: -vanc/imipenem -respiratory culture pending  ENDOCRINE  Lab 08/27/11 0417 08/27/11 0152  GLUCAP 91 63*   A:  No acute issues   P:   -monitor cbg  NEUROLOGIC  A:  Encephalopathy due to sepsis P:   -supportive care -fentanyl gtt on vent -prn versed -treat sepsis  BEST PRACTICE / DISPOSITION Level of Care:  ICU Primary Service:  PCCM Consultants:   Code Status:  Full per family discussion 6/4, will continue to address Diet:  NPO DVT Px:  scd GI Px:  pepcid Skin Integrity: sacral pressure wound noted on admission Social / Family:  Notified at bedside 6/4 by Dr Kendrick Fries  Levy Pupa, MD, PhD 08/27/2011, 8:48 AM [facility] [phone] or if no answer [phone]

## 2011-08-27 NOTE — Consult Note (Signed)
Reason for Consult:?SBO/peritonitis Referring Physician: Dr. Davy Pique is an 61 y.o. male.  HPI: Patient is a 61 year old male who was recently in the hospital for C.diff who represented with signs of sepsis.  He developed MS changes and respiratory distress and became intubated.  Per CCM he likely has pneumonia as well.  A CT of the abdomen was performed due to distention which showed findings concerning for an early closed loop obstruction with compartmentalized small bowel loops and diffuse and marked rectal wall thickening.  We were asked to see the patient regarding these findings.  The patient has an OGT in place but no NGT at present.  Patient is also an ESRD with a perm-cath in place.  Past Medical History  Diagnosis Date  . Hyperlipidemia   . ESRD (end stage renal disease)     a. MWF dialysis in [place] (followed by Dr. Kristian Covey)  . Arthritis   . Claudication   . Stroke 2005  . Anemia   . Chronic diastolic CHF (congestive heart failure) 07/2010    a. 07/2008 Echo EF 50-55%, mild-mod LVH, Gr 1 DD.  Marland Kitchen Peripheral vascular occlusive disease     a. 07/2011 Periph Angio: No signif Ao-illiac dzs, LCF stenosis, 100% LSFA w recon above knee pop and 3 vessel runoff, 100% RSFA w/ recon above knee --> pending L Fem to below Knee pop bypass.  Marland Kitchen History of tobacco abuse   . Hypertension     takes Amlodipine,Metoprolol,and Prinivil daily  . Dyspnea on exertion     with exertion    Past Surgical History  Procedure Date  . Arteriovenous graft placement   . Av fistula placement 12/10/2000    Right brachiocephalic arteriovenous   . Hernia repair   . Aortagram 07/29/2011    Abdominal Aortagram  . Cardiac catheterization 08/01/11    Left heart catheterization    Family History  Problem Relation Age of Onset  . Breast cancer Mother   . Cancer Father   . Anesthesia problems Neg Hx   . Hypotension Neg Hx   . Malignant hyperthermia Neg Hx   . Pseudochol deficiency Neg Hx      Social History:  reports that he quit smoking about 5 months ago. His smoking use included Cigarettes. He has a 40 pack-year smoking history. He does not have any smokeless tobacco history on file. He reports that he does not drink alcohol or use illicit drugs.  Allergies: No Known Allergies  Medications: I have reviewed the patient's current medications.  Results for orders placed during the hospital encounter of 08/26/11 (from the past 48 hour(s))  PRO B NATRIURETIC PEPTIDE     Status: Abnormal   Collection Time   08/26/11 11:05 PM      Component Value Range Comment   Pro B Natriuretic peptide (BNP) 25900.0 (*) 0 - 125 (pg/mL)   CBC     Status: Abnormal   Collection Time   08/26/11 11:06 PM      Component Value Range Comment   WBC 13.6 (*) 4.0 - 10.5 (K/uL)    RBC 3.15 (*) 4.22 - 5.81 (MIL/uL)    Hemoglobin 8.9 (*) 13.0 - 17.0 (g/dL)    HCT 29.5 (*) 62.1 - 52.0 (%)    MCV 91.4  78.0 - 100.0 (fL)    MCH 28.3  26.0 - 34.0 (pg)    MCHC 30.9  30.0 - 36.0 (g/dL)    RDW 30.8 (*) 65.7 - 15.5 (%)  Platelets 280  150 - 400 (K/uL)   DIFFERENTIAL     Status: Abnormal   Collection Time   08/26/11 11:06 PM      Component Value Range Comment   Neutrophils Relative 92 (*) 43 - 77 (%)    Neutro Abs 12.5 (*) 1.7 - 7.7 (K/uL)    Lymphocytes Relative 5 (*) 12 - 46 (%)    Lymphs Abs 0.6 (*) 0.7 - 4.0 (K/uL)    Monocytes Relative 3  3 - 12 (%)    Monocytes Absolute 0.4  0.1 - 1.0 (K/uL)    Eosinophils Relative 0  0 - 5 (%)    Eosinophils Absolute 0.0  0.0 - 0.7 (K/uL)    Basophils Relative 0  0 - 1 (%)    Basophils Absolute 0.1  0.0 - 0.1 (K/uL)   COMPREHENSIVE METABOLIC PANEL     Status: Abnormal   Collection Time   08/26/11 11:06 PM      Component Value Range Comment   Sodium 139  135 - 145 (mEq/L)    Potassium 3.2 (*) 3.5 - 5.1 (mEq/L)    Chloride 93 (*) 96 - 112 (mEq/L)    CO2 29  19 - 32 (mEq/L)    Glucose, Bld 139 (*) 70 - 99 (mg/dL)    BUN 26 (*) 6 - 23 (mg/dL)    Creatinine, Ser  1.47 (*) 0.50 - 1.35 (mg/dL)    Calcium 9.6  8.4 - 10.5 (mg/dL)    Total Protein 6.1  6.0 - 8.3 (g/dL)    Albumin 2.1 (*) 3.5 - 5.2 (g/dL)    AST 22  0 - 37 (U/L)    ALT 10  0 - 53 (U/L)    Alkaline Phosphatase 73  39 - 117 (U/L)    Total Bilirubin 0.4  0.3 - 1.2 (mg/dL)    GFR calc non Af Amer 10 (*) >90 (mL/min)    GFR calc Af Amer 12 (*) >90 (mL/min)   POCT I-STAT TROPONIN I     Status: Normal   Collection Time   08/26/11 11:13 PM      Component Value Range Comment   Troponin i, poc 0.00  0.00 - 0.08 (ng/mL)    Comment 3            POCT I-STAT, CHEM 8     Status: Abnormal   Collection Time   08/26/11 11:15 PM      Component Value Range Comment   Sodium 139  135 - 145 (mEq/L)    Potassium 3.2 (*) 3.5 - 5.1 (mEq/L)    Chloride 95 (*) 96 - 112 (mEq/L)    BUN 25 (*) 6 - 23 (mg/dL)    Creatinine, Ser 8.29 (*) 0.50 - 1.35 (mg/dL)    Glucose, Bld 562 (*) 70 - 99 (mg/dL)    Calcium, Ion 1.30  1.12 - 1.32 (mmol/L)    TCO2 28  0 - 100 (mmol/L)    Hemoglobin 10.2 (*) 13.0 - 17.0 (g/dL)    HCT 86.5 (*) 78.4 - 52.0 (%)   PROCALCITONIN     Status: Normal   Collection Time   08/26/11 11:29 PM      Component Value Range Comment   Procalcitonin 15.17     POCT I-STAT 3, BLOOD GAS (G3+)     Status: Abnormal   Collection Time   08/27/11 12:01 AM      Component Value Range Comment   pH, Arterial 7.401  7.350 -  7.450     pCO2 arterial 50.1 (*) 35.0 - 45.0 (mmHg)    pO2, Arterial 284.0 (*) 80.0 - 100.0 (mmHg)    Bicarbonate 31.1 (*) 20.0 - 24.0 (mEq/L)    TCO2 33  0 - 100 (mmol/L)    O2 Saturation 100.0      Acid-Base Excess 6.0 (*) 0.0 - 2.0 (mmol/L)    Collection site RADIAL, ALLEN'S TEST ACCEPTABLE      Drawn by Operator      Sample type ARTERIAL     LACTIC ACID, PLASMA     Status: Normal   Collection Time   08/27/11 12:08 AM      Component Value Range Comment   Lactic Acid, Venous 2.0  0.5 - 2.2 (mmol/L)   POCT GASTRIC OCCULT BLOOD     Status: Abnormal   Collection Time   08/27/11 12:16  AM      Component Value Range Comment   Occult Blood, Gastric POSITIVE (*) NEGATIVE    GLUCOSE, CAPILLARY     Status: Abnormal   Collection Time   08/27/11  1:52 AM      Component Value Range Comment   Glucose-Capillary 63 (*) 70 - 99 (mg/dL)    Comment 1 Documented in Chart      Comment 2 Notify RN     CARBOXYHEMOGLOBIN     Status: Abnormal   Collection Time   08/27/11  2:42 AM      Component Value Range Comment   Total hemoglobin 7.2 (*) 13.5 - 18.0 (g/dL)    O2 Saturation 29.5      Carboxyhemoglobin 1.6 (*) 0.5 - 1.5 (%)    Methemoglobin 1.1  0.0 - 1.5 (%)   PROCALCITONIN     Status: Normal   Collection Time   08/27/11  2:49 AM      Component Value Range Comment   Procalcitonin 12.92     CARDIAC PANEL(CRET KIN+CKTOT+MB+TROPI)     Status: Abnormal   Collection Time   08/27/11  2:50 AM      Component Value Range Comment   Total CK 56  7 - 232 (U/L)    CK, MB 3.2  0.3 - 4.0 (ng/mL)    Troponin I 0.44 (*) <0.30 (ng/mL)    Relative Index RELATIVE INDEX IS INVALID  0.0 - 2.5    MRSA PCR SCREENING     Status: Normal   Collection Time   08/27/11  2:55 AM      Component Value Range Comment   MRSA by PCR NEGATIVE  NEGATIVE    CBC     Status: Abnormal   Collection Time   08/27/11  3:01 AM      Component Value Range Comment   WBC 5.1  4.0 - 10.5 (K/uL)    RBC 2.89 (*) 4.22 - 5.81 (MIL/uL)    Hemoglobin 8.1 (*) 13.0 - 17.0 (g/dL) DELTA CHECK NOTED   HCT 26.4 (*) 39.0 - 52.0 (%)    MCV 91.3  78.0 - 100.0 (fL)    MCH 28.0  26.0 - 34.0 (pg)    MCHC 30.7  30.0 - 36.0 (g/dL)    RDW 62.1 (*) 30.8 - 15.5 (%)    Platelets 230  150 - 400 (K/uL)   BASIC METABOLIC PANEL     Status: Abnormal   Collection Time   08/27/11  3:01 AM      Component Value Range Comment   Sodium 141  135 - 145 (mEq/L)  Potassium 3.0 (*) 3.5 - 5.1 (mEq/L)    Chloride 102  96 - 112 (mEq/L)    CO2 26  19 - 32 (mEq/L)    Glucose, Bld 111 (*) 70 - 99 (mg/dL)    BUN 24 (*) 6 - 23 (mg/dL)    Creatinine, Ser 4.09 (*) 0.50  - 1.35 (mg/dL)    Calcium 8.1 (*) 8.4 - 10.5 (mg/dL)    GFR calc non Af Amer 11 (*) >90 (mL/min)    GFR calc Af Amer 13 (*) >90 (mL/min)   FIBRINOGEN     Status: Abnormal   Collection Time   08/27/11  3:01 AM      Component Value Range Comment   Fibrinogen 508 (*) 204 - 475 (mg/dL)   APTT     Status: Normal   Collection Time   08/27/11  3:01 AM      Component Value Range Comment   aPTT 35  24 - 37 (seconds)   PROTIME-INR     Status: Abnormal   Collection Time   08/27/11  3:01 AM      Component Value Range Comment   Prothrombin Time 17.2 (*) 11.6 - 15.2 (seconds)    INR 1.38  0.00 - 1.49    TYPE AND SCREEN     Status: Normal (Preliminary result)   Collection Time   08/27/11  3:15 AM      Component Value Range Comment   ABO/RH(D) O POS      Antibody Screen NEG      Sample Expiration 08/30/2011      Unit Number [id number]      Blood Component Type RED CELLS,LR      Unit division 00      Status of Unit ISSUED      Transfusion Status OK TO TRANSFUSE      Crossmatch Result Compatible     GLUCOSE, CAPILLARY     Status: Normal   Collection Time   08/27/11  4:17 AM      Component Value Range Comment   Glucose-Capillary 91  70 - 99 (mg/dL)   PREPARE RBC (CROSSMATCH)     Status: Normal   Collection Time   08/27/11  5:00 AM      Component Value Range Comment   Order Confirmation ORDER PROCESSED BY BLOOD BANK     CARBOXYHEMOGLOBIN     Status: Abnormal   Collection Time   08/27/11  6:15 AM      Component Value Range Comment   Total hemoglobin 8.5 (*) 13.5 - 18.0 (g/dL)    O2 Saturation 95.6      Carboxyhemoglobin 1.3  0.5 - 1.5 (%)    Methemoglobin 1.0  0.0 - 1.5 (%)   CARBOXYHEMOGLOBIN     Status: Abnormal   Collection Time   08/27/11  8:15 AM      Component Value Range Comment   Total hemoglobin 9.4 (*) 13.5 - 18.0 (g/dL)    O2 Saturation 21.3      Carboxyhemoglobin 1.6 (*) 0.5 - 1.5 (%)    Methemoglobin 1.0  0.0 - 1.5 (%)   GLUCOSE, CAPILLARY     Status: Abnormal   Collection Time    08/27/11  8:47 AM      Component Value Range Comment   Glucose-Capillary 114 (*) 70 - 99 (mg/dL)     Ct Abdomen Pelvis W Contrast  08/27/2011  *RADIOLOGY REPORT*  Clinical Data: Abdominal pain.  Septic.  CT ABDOMEN AND PELVIS WITH  CONTRAST  Technique:  Multidetector CT imaging of the abdomen and pelvis was performed following the standard protocol during bolus administration of intravenous contrast.  Contrast: 40mL OMNIPAQUE IOHEXOL 300 MG/ML  SOLN, OMNIPAQUE IOHEXOL 300 MG/ML  SOLN  Comparison: CT scan 08/13/2011.  Findings: The lung bases demonstrate bilateral pleural effusions and bilateral infiltrates.  The liver is unremarkable and stable.  No focal lesions or intrahepatic biliary dilatation.  Gallstones and gallbladder sludge are suspected.  The pancreas is grossly normal.  The spleen is normal in size.  No focal lesions.  The adrenal glands demonstrate mild nodular enlargement suggesting nodular hyperplasia.  The kidneys demonstrate multiple bilateral rim calcified cysts. The right kidney is small and scarred.  The stomach is distended with contrast.  No obvious abnormality. There is a cluster of small bowel loops in the right mid abdomen which demonstrate mild distention.  Findings suspicious for an early closed loop obstruction or possible compartmentalized small bowel. No pneumatosis or significant wall thickening.  There is enhancement of the mucosa and the vascular pedicle demonstrates enhancement.  The remaining small bowel loops are normal in caliber. The colon is grossly normal.  There is diffuse and fairly marked rectal wall thickening.  The aorta demonstrates advanced atherosclerotic changes.  No focal aneurysm or dissection.  The major branch vessels demonstrate severe atherosclerotic calcifications.  The bladder is grossly normal.  The prostate gland and seminal vesicles are unremarkable.  There is a small amount of free pelvic fluid.  Examination of the bony structures demonstrates  stable lytic lesions.  IMPRESSION:  1.  Findings concerning for an early closed loop obstruction with compartmentalized small bowel loops.  Recommend surgical consultation. 2.  Diffuse and marked rectal wall thickening.  This could be infectious or inflammatory.  3.  Severe atherosclerotic changes involving the aorta and branch vessels. 4.  Bilateral infiltrates and effusions. 5.  Severe cystic kidney disease.  Original Report Authenticated By: P. Loralie Champagne, M.D.   Dg Chest Portable 1 View  08/27/2011  *RADIOLOGY REPORT*  Clinical Data: Line placement.  PORTABLE CHEST - 1 VIEW  Comparison: 08/26/2011.  Findings: The support apparatus is in good position.  The endotracheal tube is now 4.6 cm above the carina.  Interval progressive airspace consolidation in the left lung could reflect worsening edema or infiltrate.  Small effusions are stable.  IMPRESSION:  1.  Support apparatus in good position without complicating features. 2.  Worsening airspace consolidation, left greater than right could reflect worsening edema or infiltrate. 3.  Stable small effusions.  Original Report Authenticated By: P. Loralie Champagne, M.D.   Dg Chest Port 1 View  08/26/2011  *RADIOLOGY REPORT*  Clinical Data: Endotracheal tube placement.  PORTABLE CHEST - 1 VIEW  Comparison: 08/16/2011.  Findings: The endotracheal tube is right at the right mainstem bronchus and should be retracted 3 cm.  The dialysis catheter is stable.  The NG tube is in the stomach.  There is pulmonary edema, areas of atelectasis and a right pleural effusion.  IMPRESSION: The endotracheal tube is at the right mainstem bronchus and should be retracted 3 cm.  Original Report Authenticated By: P. Loralie Champagne, M.D.    Review of Systems  Unable to perform ROS: intubated   Blood pressure 118/57, pulse 107, temperature 98.8 F (37.1 C), temperature source Oral, resp. rate 28, height 5' 6.14" (1.68 m), weight 156 lb 12 oz (71.1 kg), SpO2 95.00%. Physical Exam   Vitals reviewed. Constitutional: He appears well-nourished. No distress.  HENT:  Head: Normocephalic and atraumatic.  Neck: Neck supple.  Cardiovascular: Normal rate and regular rhythm.   Respiratory: Breath sounds normal. No respiratory distress. He has no wheezes.  GI: He exhibits distension (palpable mass noted just left on umbilicus, scar also present just left on umbilicus but inferior to mass).       No bowel sounds present  Musculoskeletal: He exhibits no edema.  Neurological:       Patient intubated and sedated  Skin: Skin is warm and dry.  Psychiatric:       Patient intubated and sedated    Assessment/Plan: 1.  ?SBO with poss inflammatory vs infectious process of the rectum: patient is currently intubated and sedated with sepsis.  Sepsis may be from multiple sources and work up is still in process but abdominal findings could be a contributor.  Has OGT in place but likely needs NGT to better decompress.  Recommend strict NPO for now.  Will discuss with Dr. Derrell Lolling as to next surgical step for patient.  Currently on vanc, primaxin and flagyl. 2.  Poss Pneumonia: being treated with abx and followed by CCM 3.  ESRD: on HD via perm-cath, poss source for sepsis as well 4.  Resp Failure: on vent  Reneshia Zuccaro 08/27/2011, 9:26 AM

## 2011-08-27 NOTE — Progress Notes (Signed)
eLink Physician-Brief Progress Note Patient Name: [name] DOB: [birth year] MRN: [id number]  Date of Service  08/27/2011   HPI/Events of Note  Patient on sepsis protocol with COOX of less than 70 and Hgb of 8.   eICU Interventions  Plan: Transfuse 1 unit of pRBCs per protocol COOX following transfusion   Intervention Category Major Interventions: Sepsis - evaluation and management  Analyah Mcconnon 08/27/2011, 4:26 AM

## 2011-08-27 NOTE — Care Management Note (Signed)
    Page 1 of 2   09/16/2011     11:28:15 AM   CARE MANAGEMENT NOTE 09/16/2011  Patient:  [name], [name]   Account Number:  [id number]  Date Initiated:  08/27/2011  Documentation initiated by:  [facility]  Subjective/Objective Assessment:   Admitted with resp failure - intubated.  Lived at home with sister and assistance with [facility] on last discharge.     Action/Plan:   PT/OT evals   Anticipated DC Date:  09/16/2011   Anticipated DC Plan:  HOME W HOME HEALTH SERVICES      DC Planning Services  CM consult      PAC Choice  IP REHAB   Choice offered to / List presented to:             [facility] agency  [facility]   Status of service:  Completed, signed off Medicare Important Message given?   (If response is "NO", the following Medicare IM given date fields will be blank) Date Medicare IM given:   Date Additional Medicare IM given:    Discharge Disposition:  IP REHAB FACILITY  Per UR Regulation:  Reviewed for med. necessity/level of care/duration of stay  If discussed at Long Length of Stay Meetings, dates discussed:    Comments:  Contact - Parv Manthey - sister - at [phone] [place] [facility] [address] [place]     Phone: cell # [phone]   home: [phone]  09/11/11- 1000- Donn Pierini RN, BSN [phone] UR completed, pt hgb down 6.3 transfused. Pt had been active with [facility] PTA for RN/PT/OT per PT/OT evals notes recommending SNF placement at discharge- CSW following for placement needs. Pt will most likely need wound vac at discharge  08-29-11 1:30pm Avie Arenas, RNBSN - [phone] patient still on vent - Not sure if patient at this time will be weanable or will need a trach.  Suggested to physician that patient would be a good Ltach candidate. Will continue to follow for needs.

## 2011-08-27 NOTE — ED Notes (Signed)
Oral contrast given down NG tube; no difficulty or resistance noted.

## 2011-08-27 NOTE — Consult Note (Signed)
[name] is an 61 y.o. male referred by Dr Delton Coombes   Chief Complaint: ESRD and need for renal replacement therapy in setting of sepsis HPI: 60yo BM admitted yest with sepsis syndrome from either PNA, bowel obstruction or even his HD cath.  Just DC'd following sepsis syndrome from C. Diff.  Currently pt is sedated and intubated and on three pressors.  Only receiving 60cc/hr of fluids.  Last HD ?  Trying to get info from his unit.  Normally dialyses MWF at [facility].  Past Medical History  Diagnosis Date  . Hyperlipidemia   . ESRD (end stage renal disease)     a. MWF dialysis in [place] (followed by Dr. Kristian Covey)  . Arthritis   . Claudication   . Stroke 2005  . Anemia   . Chronic diastolic CHF (congestive heart failure) 07/2010    a. 07/2008 Echo EF 50-55%, mild-mod LVH, Gr 1 DD.  Marland Kitchen Peripheral vascular occlusive disease     a. 07/2011 Periph Angio: No signif Ao-illiac dzs, LCF stenosis, 100% LSFA w recon above knee pop and 3 vessel runoff, 100% RSFA w/ recon above knee --> pending L Fem to below Knee pop bypass.  Marland Kitchen History of tobacco abuse   . Hypertension     takes Amlodipine,Metoprolol,and Prinivil daily  . Dyspnea on exertion     with exertion    Past Surgical History  Procedure Date  . Arteriovenous graft placement   . Av fistula placement 12/10/2000    Right brachiocephalic arteriovenous   . Hernia repair   . Aortagram 07/29/2011    Abdominal Aortagram  . Cardiac catheterization 08/01/11    Left heart catheterization    Family History  Problem Relation Age of Onset  . Breast cancer Mother   . Cancer Father   . Anesthesia problems Neg Hx   . Hypotension Neg Hx   . Malignant hyperthermia Neg Hx   . Pseudochol deficiency Neg Hx    Social History:  reports that he quit smoking about 5 months ago. His smoking use included Cigarettes. He has a 40 pack-year smoking history. He does not have any smokeless tobacco history on file. He reports that he does not drink alcohol  or use illicit drugs.  Allergies: No Known Allergies  Medications Prior to Admission  Medication Sig Dispense Refill  . amLODipine (NORVASC) 10 MG tablet Take 10 mg by mouth daily.      Marland Kitchen aspirin EC 81 MG tablet Take 1 tablet (81 mg total) by mouth daily.  90 tablet  3  . calcitRIOL (ROCALTROL) 0.25 MCG capsule Take 0.25 mcg by mouth daily.      . calcium acetate (PHOSLO) 667 MG capsule Take 667-1,334 mg by mouth 3 (three) times daily with meals. 2 caps with each meal, 1 cap with snacks      . cinacalcet (SENSIPAR) 30 MG tablet Take 30 mg by mouth at bedtime.      . gabapentin (NEURONTIN) 100 MG capsule Take 100 mg by mouth 2 (two) times daily.      . isosorbide mononitrate (IMDUR) 30 MG 24 hr tablet Take 30 mg by mouth daily.      Marland Kitchen lanthanum (FOSRENOL) 1000 MG chewable tablet Chew 1,000-2,000 mg by mouth 3 (three) times daily with meals. 2 tabs with each meal, 1 tab with snacks      . lisinopril (PRINIVIL,ZESTRIL) 30 MG tablet Take 30 mg by mouth daily.      . metoprolol succinate (TOPROL-XL) 100  MG 24 hr tablet Take 100 mg by mouth 2 (two) times daily. Take with or immediately following a meal.      . metroNIDAZOLE (FLAGYL) 500 MG tablet Take 1 tablet (500 mg total) by mouth every 8 (eight) hours.  20 tablet  0  . multivitamin (RENA-VIT) TABS tablet Take 1 tablet by mouth daily.      Marland Kitchen oxyCODONE (OXY IR/ROXICODONE) 5 MG immediate release tablet Take 1-2 tablets (5-10 mg total) by mouth every 6 (six) hours as needed for pain.  30 tablet  0  . sevelamer (RENVELA) 800 MG tablet Take 800-2,400 mg by mouth 3 (three) times daily with meals. Take 4 tabs with meals and 1 tab with snacks      . simvastatin (ZOCOR) 5 MG tablet Take 5 mg by mouth at bedtime.         Lab Results: UA: not done  [facility] 08/27/11 0301 08/26/11 2315 08/26/11 2306  WBC 5.1 -- 13.6*  HGB 8.1* 10.2* 8.9*  HCT 26.4* 30.0* 28.8*  PLT 230 -- 280   BMET  Basename 08/27/11 0301 08/26/11 2315 08/26/11 2306  NA 141 139  139  K 3.0* 3.2* 3.2*  CL 102 95* 93*  CO2 26 -- 29  GLUCOSE 111* 137* 139*  BUN 24* 25* 26*  CREATININE 5.10* 5.80* 5.51*  CALCIUM 8.1* -- 9.6  PHOS -- -- --   LFT  Basename 08/26/11 2306  PROT 6.1  ALBUMIN 2.1*  AST 22  ALT 10  ALKPHOS 73  BILITOT 0.4  BILIDIR --  IBILI --   Ct Abdomen Pelvis W Contrast  08/27/2011  *RADIOLOGY REPORT*  Clinical Data: Abdominal pain.  Septic.  CT ABDOMEN AND PELVIS WITH CONTRAST  Technique:  Multidetector CT imaging of the abdomen and pelvis was performed following the standard protocol during bolus administration of intravenous contrast.  Contrast: 40mL OMNIPAQUE IOHEXOL 300 MG/ML  SOLN, OMNIPAQUE IOHEXOL 300 MG/ML  SOLN  Comparison: CT scan 08/13/2011.  Findings: The lung bases demonstrate bilateral pleural effusions and bilateral infiltrates.  The liver is unremarkable and stable.  No focal lesions or intrahepatic biliary dilatation.  Gallstones and gallbladder sludge are suspected.  The pancreas is grossly normal.  The spleen is normal in size.  No focal lesions.  The adrenal glands demonstrate mild nodular enlargement suggesting nodular hyperplasia.  The kidneys demonstrate multiple bilateral rim calcified cysts. The right kidney is small and scarred.  The stomach is distended with contrast.  No obvious abnormality. There is a cluster of small bowel loops in the right mid abdomen which demonstrate mild distention.  Findings suspicious for an early closed loop obstruction or possible compartmentalized small bowel. No pneumatosis or significant wall thickening.  There is enhancement of the mucosa and the vascular pedicle demonstrates enhancement.  The remaining small bowel loops are normal in caliber. The colon is grossly normal.  There is diffuse and fairly marked rectal wall thickening.  The aorta demonstrates advanced atherosclerotic changes.  No focal aneurysm or dissection.  The major branch vessels demonstrate severe atherosclerotic  calcifications.  The bladder is grossly normal.  The prostate gland and seminal vesicles are unremarkable.  There is a small amount of free pelvic fluid.  Examination of the bony structures demonstrates stable lytic lesions.  IMPRESSION:  1.  Findings concerning for an early closed loop obstruction with compartmentalized small bowel loops.  Recommend surgical consultation. 2.  Diffuse and marked rectal wall thickening.  This could be infectious or inflammatory.  3.  Severe atherosclerotic changes involving the aorta and branch vessels. 4.  Bilateral infiltrates and effusions. 5.  Severe cystic kidney disease.  Original Report Authenticated By: P. Loralie Champagne, M.D.   Dg Chest Portable 1 View  08/27/2011  *RADIOLOGY REPORT*  Clinical Data: Line placement.  PORTABLE CHEST - 1 VIEW  Comparison: 08/26/2011.  Findings: The support apparatus is in good position.  The endotracheal tube is now 4.6 cm above the carina.  Interval progressive airspace consolidation in the left lung could reflect worsening edema or infiltrate.  Small effusions are stable.  IMPRESSION:  1.  Support apparatus in good position without complicating features. 2.  Worsening airspace consolidation, left greater than right could reflect worsening edema or infiltrate. 3.  Stable small effusions.  Original Report Authenticated By: P. Loralie Champagne, M.D.   Dg Chest Port 1 View  08/26/2011  *RADIOLOGY REPORT*  Clinical Data: Endotracheal tube placement.  PORTABLE CHEST - 1 VIEW  Comparison: 08/16/2011.  Findings: The endotracheal tube is right at the right mainstem bronchus and should be retracted 3 cm.  The dialysis catheter is stable.  The NG tube is in the stomach.  There is pulmonary edema, areas of atelectasis and a right pleural effusion.  IMPRESSION: The endotracheal tube is at the right mainstem bronchus and should be retracted 3 cm.  Original Report Authenticated By: P. Loralie Champagne, M.D.    ROS: Unable to obtain as pt  intubated  PHYSICAL EXAM: Blood pressure 118/57, pulse 107, temperature 98.8 F (37.1 C), temperature source Oral, resp. rate 28, height 5' 6.14" (1.68 m), weight 71.1 kg (156 lb 12 oz), SpO2 95.00%. HEENT: PERRL NECK:Lt IJ perm cath  Rt Subclavian central line LUNGS:Bil crackles Lt > Rt CARDIAC:tachy reg no rub WUJ:WJXB BS +distention EXT:no edema  Failed dialysis access in both arms NEURO:Sedated  Assessment: 1. Septic shock from either PNA, Bowel obstruction or HD catheter 2. ESRD 3. PNA PLAN: 1.  No urgent need for dialysis now.  Hourly fluid requirements are not high.  Will anticipate need for RRT in the AM.  If BP better then could try HD, If not the CVVHD 2. Obtain info from [place] 3.  Has received 4 runs of KCL with a fifth planned, will DC 5th run 4. Daily labs   Sydell Prowell T 08/27/2011, 11:12 AM

## 2011-08-27 NOTE — Consult Note (Signed)
I have examined this patient twice this morning, once while he was sedated and once 30 minutes after all sedation was discontinued. I have discussed his care with Dr. Levy Pupa. I have reviewed his CT scan with Dr. Darl Pikes in radiology.  This patient is septic on 3 pressors. He is ventilator dependent. He has end-stage renal disease and has a subclavian dialysis  catheter on the left.. He has significant pneumonia. He was recently treated for C. Difficile at a recent hospitalization.  Physical exam off all sedation reveals that he opens his eyes and acknowledges my presence but will not follow commands or squeeze my hand. Abdomen is distended. There are a couple of scars present one on the left and the drain site on the right from unknown previous surgery. There is no hernia of the abdominal wall or the inguinal areas. The abdomen is distended and firm but I cannot elicit any peritoneal signs by direct percussion or rebound on either side. There are no localizing findings. Bowel sounds absent.  His CT scan shows oral contrast in the stomach and proximal duodenum but he was scanned before it got much further. There is an area of slightly dilated small bowel in the central abdomen. No significant wall thickening. The bowel is well perfused and does not appear ischemic radiographically. There is no abscess or free air. The rectal wall is somewhat thickened consistent with his recent episode of C. Difficile colitis.  Assessment: Abdominal distention. Etiology of this might simply be ileus. Cannot rule out small bowel obstruction I do not think that he has ischemic bowel or a closed loop obstruction or peritonitis  Medically.  Difficult exam.   Septic shock on 3 pressors. General anesthesia and expiratory laparotomy in this setting  would carry a very high mortality, perhaps as high as 75%  Source of sepsis is unknown, although pneumonia and his central line are possibilities.  Plan: At this point in time  I do not plan to operate on him, although I will need to follow him very carefully. Abdominal x-rays will be ordered for tomorrow. Laparotomy will be offered if there is any change in his abdominal exam or any radiographic evidence of perforation or true obstruction.  Prognosis is guarded at best.   Angelia Mould. Derrell Lolling, M.D., [facility] General and Minimally invasive Surgery Breast and Colorectal Surgery Office:   [phone] Pager:   [phone]

## 2011-08-27 NOTE — Procedures (Signed)
Central Venous Catheter Insertion Procedure Note DELONTAE LAMM [id number] 06-Feb-1951  Procedure: Insertion of Central Venous Catheter Indications: Assessment of intravascular volume and Drug and/or fluid administration  Procedure Details Consent: Risks of procedure as well as the alternatives and risks of each were explained to the (patient/caregiver).  Consent for procedure obtained. Time Out: Verified patient identification, verified procedure, site/side was marked, verified correct patient position, special equipment/implants available, medications/allergies/relevent history reviewed, required imaging and test results available.  Performed  Maximum sterile technique was used including antiseptics, cap, gloves, gown, hand hygiene, mask and sheet. Skin prep: Chlorhexidine; local anesthetic administered A antimicrobial bonded/coated triple lumen catheter was placed in the right femoral vein due to patient being a dialysis patient using the Seldinger technique. Ultrasound was used for vessel identification.  Evaluation Blood flow good Complications: No apparent complications Patient did tolerate procedure well. Chest X-ray ordered to verify placement.  CXR: not indicated.  Yassin Scales 08/27/2011, 1:16 AM

## 2011-08-27 NOTE — Procedures (Signed)
Arterial Line Insertion Procedure Note [name] [id number] 1950/09/26  Procedure: Insertion of Central Venous Catheter Indications: Frequent blood sampling and blood pressure monitoring  Procedure Details Consent: Risks of procedure as well as the alternatives and risks of each were explained to the (patient/caregiver).  Consent for procedure obtained. Time Out: Verified patient identification, verified procedure, site/side was marked, verified correct patient position, special equipment/implants available, medications/allergies/relevent history reviewed, required imaging and test results available.  Performed  Maximum sterile technique was used including antiseptics, cap, gloves, gown, hand hygiene, mask and sheet. Skin prep: Chlorhexidine; local anesthetic administered A  single lumen arterial catheter was placed in the right femoral artery under ultrasound guidance due to patient being a dialysis patient using the Seldinger technique.  Evaluation Blood flow good Complications: No apparent complications Patient did tolerate procedure well. Chest X-ray ordered to verify placement.  CXR: not indicated.  [name] 08/27/2011, 1:17 AM

## 2011-08-27 NOTE — Progress Notes (Signed)
ANTIBIOTIC CONSULT NOTE - INITIAL  Pharmacy Consult for vancomcycin iv & po, imipenem, flagyl Indication: sepsis  No Known Allergies  Patient Measurements: Height: 5' 6.14" (168 cm) Weight: 147 lb 11.3 oz (67 kg) IBW/kg (Calculated) : 64.13  Adjusted Body Weight:   Vital Signs: Temp: 96.8 F (36 C) (06/05 0102) Temp src: Rectal (06/05 0102) BP: 86/43 mmHg (06/05 0120) Pulse Rate: 82  (06/05 0056) Intake/Output from previous day:   Intake/Output from this shift:    Labs:  Basename 08/26/11 2315 08/26/11 2306  WBC -- 13.6*  HGB 10.2* 8.9*  PLT -- 280  LABCREA -- --  CREATININE 5.80* 5.51*   Estimated Creatinine Clearance: 12 ml/min (by C-G formula based on Cr of 5.8). No results found for this basename: VANCOTROUGH:2,VANCOPEAK:2,VANCORANDOM:2,GENTTROUGH:2,GENTPEAK:2,GENTRANDOM:2,TOBRATROUGH:2,TOBRAPEAK:2,TOBRARND:2,AMIKACINPEAK:2,AMIKACINTROU:2,AMIKACIN:2, in the last 72 hours   Microbiology: Recent Results (from the past 720 hour(s))  CULTURE, BLOOD (ROUTINE X 2)     Status: Normal   Collection Time   08/13/11  3:15 PM      Component Value Range Status Comment   Specimen Description BLOOD CENTRAL LINE   Final    Special Requests BOTTLES DRAWN AEROBIC AND ANAEROBIC   Final    Culture  Setup Time [id number]   Final    Culture     Final    Value: DIPHTHEROIDS(CORYNEBACTERIUM SPECIES)     Note: Standardized susceptibility testing for this organism is not available.     Note: Gram Stain Report Called to,Read Back By and Verified With: GENIVEVE WROBLEWSKI @1150  08/15/11 BY KRAWS   Report Status 08/16/2011 FINAL   Final   CULTURE, BLOOD (ROUTINE X 2)     Status: Normal   Collection Time   08/13/11  3:50 PM      Component Value Range Status Comment   Specimen Description BLOOD HAND RIGHT   Final    Special Requests BOTTLES DRAWN AEROBIC ONLY 5CC   Final    Culture  Setup Time [id number]   Final    Culture NO GROWTH 5 DAYS   Final    Report Status 08/20/2011  FINAL   Final   MRSA PCR SCREENING     Status: Normal   Collection Time   08/13/11  6:50 PM      Component Value Range Status Comment   MRSA by PCR NEGATIVE  NEGATIVE  Final   CLOSTRIDIUM DIFFICILE BY PCR     Status: Abnormal   Collection Time   08/13/11 10:46 PM      Component Value Range Status Comment   C difficile by pcr POSITIVE (*) NEGATIVE  Final   BODY FLUID CULTURE     Status: Normal   Collection Time   08/15/11  5:58 PM      Component Value Range Status Comment   Specimen Description PLEURAL FLUID RIGHT   Final    Special Requests NONE   Final    Gram Stain     Final    Value: RARE WBC PRESENT,BOTH PMN AND MONONUCLEAR     NO ORGANISMS SEEN   Culture NO GROWTH 3 DAYS   Final    Report Status 08/19/2011 FINAL   Final     Medical History: Past Medical History  Diagnosis Date  . Hyperlipidemia   . ESRD (end stage renal disease)     a. MWF dialysis in [place] (followed by Dr. Kristian Covey)  . Arthritis   . Claudication   . Stroke 2005  . Anemia   . Chronic diastolic CHF (  congestive heart failure) 07/2010    a. 07/2008 Echo EF 50-55%, mild-mod LVH, Gr 1 DD.  Marland Kitchen Peripheral vascular occlusive disease     a. 07/2011 Periph Angio: No signif Ao-illiac dzs, LCF stenosis, 100% LSFA w recon above knee pop and 3 vessel runoff, 100% RSFA w/ recon above knee --> pending L Fem to below Knee pop bypass.  Marland Kitchen History of tobacco abuse   . Hypertension     takes Amlodipine,Metoprolol,and Prinivil daily  . Dyspnea on exertion     with exertion    Medications:  Prescriptions prior to admission  Medication Sig Dispense Refill  . amLODipine (NORVASC) 10 MG tablet Take 10 mg by mouth daily.      Marland Kitchen aspirin EC 81 MG tablet Take 1 tablet (81 mg total) by mouth daily.  90 tablet  3  . calcitRIOL (ROCALTROL) 0.25 MCG capsule Take 0.25 mcg by mouth daily.      . calcium acetate (PHOSLO) 667 MG capsule Take 667-1,334 mg by mouth 3 (three) times daily with meals. 2 caps with each meal, 1 cap with snacks       . cinacalcet (SENSIPAR) 30 MG tablet Take 30 mg by mouth at bedtime.      . gabapentin (NEURONTIN) 100 MG capsule Take 100 mg by mouth 2 (two) times daily.      . isosorbide mononitrate (IMDUR) 30 MG 24 hr tablet Take 30 mg by mouth daily.      Marland Kitchen lanthanum (FOSRENOL) 1000 MG chewable tablet Chew 1,000-2,000 mg by mouth 3 (three) times daily with meals. 2 tabs with each meal, 1 tab with snacks      . lisinopril (PRINIVIL,ZESTRIL) 30 MG tablet Take 30 mg by mouth daily.      . metoprolol succinate (TOPROL-XL) 100 MG 24 hr tablet Take 100 mg by mouth 2 (two) times daily. Take with or immediately following a meal.      . metroNIDAZOLE (FLAGYL) 500 MG tablet Take 1 tablet (500 mg total) by mouth every 8 (eight) hours.  20 tablet  0  . multivitamin (RENA-VIT) TABS tablet Take 1 tablet by mouth daily.      Marland Kitchen oxyCODONE (OXY IR/ROXICODONE) 5 MG immediate release tablet Take 1-2 tablets (5-10 mg total) by mouth every 6 (six) hours as needed for pain.  30 tablet  0  . sevelamer (RENVELA) 800 MG tablet Take 800-2,400 mg by mouth 3 (three) times daily with meals. Take 4 tabs with meals and 1 tab with snacks      . simvastatin (ZOCOR) 5 MG tablet Take 5 mg by mouth at bedtime.       Assessment: 61 yo with PVD and ESRD dc home on 6/1 after severe sepsis due to diff. Doing well until 6/4 when he developed n/v resp distress and confusion. Last HD 6/3  Goal of Therapy:  Vancomycin trough level 15-20 mcg/ml Renal adjust imipenem  Plan: give an additional 500 mg vancomycin (total 1500 mg tonight) then 750mg  after each HD session. Vancomycin 250mg  q6 hours. Imipenem 250 mg q12h. Flagyl 500 mg iv q8h  (f/u HD schedule once established. Will assume M-W-F for now.   Janice Coffin 08/27/2011,2:43 AM

## 2011-08-27 NOTE — Progress Notes (Signed)
CRITICAL VALUE ALERT  Critical value received:  Troponin 0.44  Date of notification:  08/27/2011  Time of notification:  02:50  Critical value read back:yes  Nurse who received alert:  Carlyon Prows RN  MD notified (1st page):  Dr. Bea Laura. Deterding   Time of first page:  02:50  MD notified (2nd page):  Time of second page:  Responding MD:  Dr. Bea Laura Deterding   Time MD responded:  02:50

## 2011-08-27 NOTE — ED Notes (Addendum)
Consent for central line obtained. MD at bedside to place.

## 2011-08-27 NOTE — Progress Notes (Signed)
UR Completed.  Ksean Vale Jane [phone] 08/27/2011  

## 2011-08-27 NOTE — ED Provider Notes (Addendum)
History     CSN: [id number]  Arrival date & time 08/26/11  2244   First MD Initiated Contact with Patient 08/26/11 2304      Chief Complaint  Patient presents with  . Shortness of Breath    (Consider location/radiation/quality/duration/timing/severity/associated sxs/prior treatment) HPI 61 yo male presents to the ER via EMS with report of confusion and shortness of breath.  EMS reports pt was at a home, unclear if it was the patient's residence, and people in the home reported pt's breathing was labored.  Pt was confused on scene, improved slightly with albuterol neb in route, but now with increased respiratory rate and confusion in the ED.  Pt reported ESRD, had dialysis yesterday, due tomorrow.  No other history able to be obtained from patient.  Records show patient recently admitted with similar presentation with severe sepsis, d/c 4 days ago.   Past Medical History  Diagnosis Date  . Hyperlipidemia   . ESRD (end stage renal disease)     a. MWF dialysis in [place] (followed by Dr. Kristian Covey)  . Arthritis   . Claudication   . Stroke 2005  . Anemia   . Chronic diastolic CHF (congestive heart failure) 07/2010    a. 07/2008 Echo EF 50-55%, mild-mod LVH, Gr 1 DD.  Marland Kitchen Peripheral vascular occlusive disease     a. 07/2011 Periph Angio: No signif Ao-illiac dzs, LCF stenosis, 100% LSFA w recon above knee pop and 3 vessel runoff, 100% RSFA w/ recon above knee --> pending L Fem to below Knee pop bypass.  Marland Kitchen History of tobacco abuse   . Hypertension     takes Amlodipine,Metoprolol,and Prinivil daily  . Dyspnea on exertion     with exertion    Past Surgical History  Procedure Date  . Arteriovenous graft placement   . Av fistula placement 12/10/2000    Right brachiocephalic arteriovenous   . Hernia repair   . Aortagram 07/29/2011    Abdominal Aortagram  . Cardiac catheterization 08/01/11    Left heart catheterization    Family History  Problem Relation Age of Onset  . Breast cancer  Mother   . Cancer Father   . Anesthesia problems Neg Hx   . Hypotension Neg Hx   . Malignant hyperthermia Neg Hx   . Pseudochol deficiency Neg Hx     History  Substance Use Topics  . Smoking status: Former Smoker -- 1.0 packs/day for 40 years    Types: Cigarettes    Quit date: 03/25/2011  . Smokeless tobacco: Not on file  . Alcohol Use: No      Review of Systems  Unable to perform ROS: Mental status change    Allergies  Review of patient's allergies indicates no known allergies.  Home Medications  No current outpatient prescriptions on file.  BP 60/14  Pulse 72  Temp(Src) 96.8 F (36 C) (Rectal)  Resp 18  Ht 5' 6.14" (1.68 m)  Wt 147 lb 11.3 oz (67 kg)  BMI 23.74 kg/m2  SpO2 92%  Physical Exam  Nursing note and vitals reviewed. Constitutional:       Pt hypotensive, tachypneic, confused and barely responsive to voice, very ill appearing  HENT:  Head: Normocephalic and atraumatic.  Eyes: Pupils are equal, round, and reactive to light.  Neck: Normal range of motion. Neck supple. JVD present. No tracheal deviation present. No thyromegaly present.  Cardiovascular: Normal rate, regular rhythm, normal heart sounds and intact distal pulses.  Exam reveals no gallop and no friction  rub.   No murmur heard. Pulmonary/Chest: No stridor. He is in respiratory distress. He has no wheezes. He has rales. He exhibits no tenderness.       Tachypnea.  Hemodialysis catheter in left chest  Abdominal: Soft. He exhibits distension and mass. There is no tenderness. There is no rebound and no guarding.       Firm mass to left of midline abd scar.  Decreased bowel sounds  Musculoskeletal: He exhibits edema. He exhibits no tenderness.  Lymphadenopathy:    He has no cervical adenopathy.  Neurological:       Responds sluggishly to voice  Skin: Skin is dry. No rash noted. No erythema. No pallor.    ED Course  Procedures (including critical care time)  CRITICAL CARE Performed by:  Olivia Mackie   Total critical care time: 75  Critical care time was exclusive of separately billable procedures and treating other patients.  Critical care was necessary to treat or prevent imminent or life-threatening deterioration.  Critical care was time spent personally by me on the following activities: development of treatment plan with patient and/or surrogate as well as nursing, discussions with consultants, evaluation of patient's response to treatment, examination of patient, obtaining history from patient or surrogate, ordering and performing treatments and interventions, ordering and review of laboratory studies, ordering and review of radiographic studies, pulse oximetry and re-evaluation of patient's condition.  Pt was intubated by [facility] resident, Dr Purvis Sheffield under my direct supervision and direction.  Pt with copious amounts of green fluid noted during intubation, concern for aspiration.  D/w Dr Deterding of critical care, Dr Kendrick Fries with critical care to admit.  Labs Reviewed  PRO B NATRIURETIC PEPTIDE - Abnormal; Notable for the following:    Pro B Natriuretic peptide (BNP) 25900.0 (*)    All other components within normal limits  CBC - Abnormal; Notable for the following:    WBC 13.6 (*)    RBC 3.15 (*)    Hemoglobin 8.9 (*)    HCT 28.8 (*)    RDW 17.8 (*)    All other components within normal limits  DIFFERENTIAL - Abnormal; Notable for the following:    Neutrophils Relative 92 (*)    Neutro Abs 12.5 (*)    Lymphocytes Relative 5 (*)    Lymphs Abs 0.6 (*)    All other components within normal limits  COMPREHENSIVE METABOLIC PANEL - Abnormal; Notable for the following:    Potassium 3.2 (*)    Chloride 93 (*)    Glucose, Bld 139 (*)    BUN 26 (*)    Creatinine, Ser 5.51 (*)    Albumin 2.1 (*)    GFR calc non Af Amer 10 (*)    GFR calc Af Amer 12 (*)    All other components within normal limits  POCT I-STAT, CHEM 8 - Abnormal; Notable for the  following:    Potassium 3.2 (*)    Chloride 95 (*)    BUN 25 (*)    Creatinine, Ser 5.80 (*)    Glucose, Bld 137 (*)    Hemoglobin 10.2 (*)    HCT 30.0 (*)    All other components within normal limits  POCT I-STAT 3, BLOOD GAS (G3+) - Abnormal; Notable for the following:    pCO2 arterial 50.1 (*)    pO2, Arterial 284.0 (*)    Bicarbonate 31.1 (*)    Acid-Base Excess 6.0 (*)    All other components within normal limits  POCT GASTRIC OCCULT BLOOD - Abnormal; Notable for the following:    Occult Blood, Gastric POSITIVE (*)    All other components within normal limits  GLUCOSE, CAPILLARY - Abnormal; Notable for the following:    Glucose-Capillary 63 (*)    All other components within normal limits  CBC - Abnormal; Notable for the following:    RBC 2.89 (*)    Hemoglobin 8.1 (*) DELTA CHECK NOTED   HCT 26.4 (*)    RDW 17.8 (*)    All other components within normal limits  BASIC METABOLIC PANEL - Abnormal; Notable for the following:    Potassium 3.0 (*)    Glucose, Bld 111 (*)    BUN 24 (*)    Creatinine, Ser 5.10 (*)    Calcium 8.1 (*)    GFR calc non Af Amer 11 (*)    GFR calc Af Amer 13 (*)    All other components within normal limits  CARDIAC PANEL(CRET KIN+CKTOT+MB+TROPI) - Abnormal; Notable for the following:    Troponin I 0.44 (*)    All other components within normal limits  FIBRINOGEN - Abnormal; Notable for the following:    Fibrinogen 508 (*)    All other components within normal limits  PROTIME-INR - Abnormal; Notable for the following:    Prothrombin Time 17.2 (*)    All other components within normal limits  CARBOXYHEMOGLOBIN - Abnormal; Notable for the following:    Total hemoglobin 7.2 (*)    Carboxyhemoglobin 1.6 (*)    All other components within normal limits  POCT I-STAT TROPONIN I  LACTIC ACID, PLASMA  PROCALCITONIN  PROCALCITONIN  TYPE AND SCREEN  APTT  CULTURE, BLOOD (ROUTINE X 2)  CULTURE, BLOOD (ROUTINE X 2)  CULTURE, RESPIRATORY  BLOOD  GAS, ARTERIAL  CULTURE, BLOOD (ROUTINE X 2)  CULTURE, BLOOD (ROUTINE X 2)  CORTISOL  MRSA PCR SCREENING   Dg Chest Portable 1 View  08/27/2011  *RADIOLOGY REPORT*  Clinical Data: Line placement.  PORTABLE CHEST - 1 VIEW  Comparison: 08/26/2011.  Findings: The support apparatus is in good position.  The endotracheal tube is now 4.6 cm above the carina.  Interval progressive airspace consolidation in the left lung could reflect worsening edema or infiltrate.  Small effusions are stable.  IMPRESSION:  1.  Support apparatus in good position without complicating features. 2.  Worsening airspace consolidation, left greater than right could reflect worsening edema or infiltrate. 3.  Stable small effusions.  Original Report Authenticated By: P. Loralie Champagne, M.D.   Dg Chest Port 1 View  08/26/2011  *RADIOLOGY REPORT*  Clinical Data: Endotracheal tube placement.  PORTABLE CHEST - 1 VIEW  Comparison: 08/16/2011.  Findings: The endotracheal tube is right at the right mainstem bronchus and should be retracted 3 cm.  The dialysis catheter is stable.  The NG tube is in the stomach.  There is pulmonary edema, areas of atelectasis and a right pleural effusion.  IMPRESSION: The endotracheal tube is at the right mainstem bronchus and should be retracted 3 cm.  Original Report Authenticated By: P. Loralie Champagne, M.D.     1. Altered mental status   2. Hypotension   3. Respiratory distress   4. Acute encephalopathy   5. Acute respiratory failure with hypoxia   6. C. difficile colitis   7. End stage renal disease   8. SBO (small bowel obstruction)   9. Septic shock      Date: 08/26/2011  Rate: 106  Rhythm: sinus tachycardia  QRS Axis: normal  Intervals: normal  ST/T Wave abnormalities: normal  Conduction Disutrbances:none  Narrative Interpretation:   Old EKG Reviewed: none available    MDM  61 yo male with ams, respiratory distress with recent d/c after sepsis.  Pt intubated emergently due to ams  and concern for aspiration, with aspiration most likely already occurred.  To be admitted to ICU.       Olivia Mackie, MD 08/27/11 0865  Olivia Mackie, MD 09/26/11 7846  Olivia Mackie, MD 09/26/11 [phone]

## 2011-08-27 NOTE — ED Notes (Signed)
Pt continues to make no efforts against vent; appears comfortable. RN will continue to monitor.

## 2011-08-27 NOTE — ED Notes (Signed)
All pT belongings; including medications sent to unit in patient belongings bag

## 2011-08-27 NOTE — ED Notes (Signed)
Pt covered with warm blankets. 

## 2011-08-28 ENCOUNTER — Inpatient Hospital Stay (HOSPITAL_COMMUNITY): Payer: Medicare Other

## 2011-08-28 DIAGNOSIS — J189 Pneumonia, unspecified organism: Secondary | ICD-10-CM

## 2011-08-28 DIAGNOSIS — N186 End stage renal disease: Secondary | ICD-10-CM

## 2011-08-28 LAB — COMPREHENSIVE METABOLIC PANEL
AST: 17 U/L (ref 0–37)
Albumin: 1.4 g/dL — ABNORMAL LOW (ref 3.5–5.2)
Alkaline Phosphatase: 50 U/L (ref 39–117)
BUN: 29 mg/dL — ABNORMAL HIGH (ref 6–23)
Potassium: 3.6 mEq/L (ref 3.5–5.1)
Total Protein: 4.6 g/dL — ABNORMAL LOW (ref 6.0–8.3)

## 2011-08-28 LAB — POCT I-STAT 3, ART BLOOD GAS (G3+)
Acid-Base Excess: 1 mmol/L (ref 0.0–2.0)
Acid-Base Excess: 1 mmol/L (ref 0.0–2.0)
Bicarbonate: 27.3 meq/L — ABNORMAL HIGH (ref 20.0–24.0)
Bicarbonate: 26.9 mEq/L — ABNORMAL HIGH (ref 20.0–24.0)
O2 Saturation: 96 %
Patient temperature: 97.7
Patient temperature: 97.7
TCO2: 29 mmol/L (ref 0–100)
pCO2 arterial: 50 mmHg — ABNORMAL HIGH (ref 35.0–45.0)
pH, Arterial: 7.342 — ABNORMAL LOW (ref 7.350–7.450)
pH, Arterial: 7.356 (ref 7.350–7.450)
pO2, Arterial: 86 mmHg (ref 80.0–100.0)

## 2011-08-28 LAB — TYPE AND SCREEN: Unit division: 0

## 2011-08-28 LAB — CBC
HCT: 23.7 % — ABNORMAL LOW (ref 39.0–52.0)
MCV: 90.8 fL (ref 78.0–100.0)
RBC: 2.61 MIL/uL — ABNORMAL LOW (ref 4.22–5.81)
WBC: 7.8 10*3/uL (ref 4.0–10.5)

## 2011-08-28 LAB — GLUCOSE, CAPILLARY
Glucose-Capillary: 100 mg/dL — ABNORMAL HIGH (ref 70–99)
Glucose-Capillary: 108 mg/dL — ABNORMAL HIGH (ref 70–99)
Glucose-Capillary: 51 mg/dL — ABNORMAL LOW (ref 70–99)
Glucose-Capillary: 75 mg/dL (ref 70–99)
Glucose-Capillary: 81 mg/dL (ref 70–99)
Glucose-Capillary: 82 mg/dL (ref 70–99)
Glucose-Capillary: 90 mg/dL (ref 70–99)

## 2011-08-28 LAB — CLOSTRIDIUM DIFFICILE BY PCR: Toxigenic C. Difficile by PCR: POSITIVE — AB

## 2011-08-28 MED ORDER — VANCOMYCIN HCL 500 MG IV SOLR
500.0000 mg | Freq: Four times a day (QID) | Status: DC
  Administered 2011-08-28 – 2011-08-31 (×9): 500 mg via RECTAL
  Filled 2011-08-28 (×23): qty 500

## 2011-08-28 MED ORDER — VANCOMYCIN HCL 1000 MG IV SOLR
750.0000 mg | Freq: Once | INTRAVENOUS | Status: AC
  Administered 2011-08-28: 750 mg via INTRAVENOUS
  Filled 2011-08-28: qty 750

## 2011-08-28 MED ORDER — DEXTROSE 50 % IV SOLN
INTRAVENOUS | Status: AC
  Administered 2011-08-28: 50 mL
  Filled 2011-08-28: qty 50

## 2011-08-28 MED ORDER — DARBEPOETIN ALFA-POLYSORBATE 100 MCG/0.5ML IJ SOLN
100.0000 ug | INTRAMUSCULAR | Status: DC
  Administered 2011-08-28: 100 ug via INTRAVENOUS
  Filled 2011-08-28: qty 0.5

## 2011-08-28 MED ORDER — DARBEPOETIN ALFA-POLYSORBATE 100 MCG/0.5ML IJ SOLN
INTRAMUSCULAR | Status: AC
  Filled 2011-08-28: qty 0.5

## 2011-08-28 NOTE — Progress Notes (Signed)
S:intubated   Denies abd pain O:BP 113/63  Pulse 80  Temp(Src) 97.7 F (36.5 C) (Oral)  Resp 17  Ht 5' 6.14" (1.68 m)  Wt 75.7 kg (166 lb 14.2 oz)  BMI 26.82 kg/m2  SpO2 100%  Intake/Output Summary (Last 24 hours) at 08/28/11 0738 Last data filed at 08/28/11 0600  Gross per 24 hour  Intake  945.5 ml  Output      0 ml  Net  945.5 ml   Weight change: 8.7 kg (19 lb 2.9 oz) ZOX:[id number] CVS:RRR Resp:bil crackles Abd:no BS + distention,  Only minimal tenderness Ext:no edema NEURO:Follows simple commands      . chlorhexidine      . chlorhexidine      . famotidine (PEPCID) IV  20 mg Intravenous Q12H  . imipenem-cilastatin  250 mg Intravenous Q12H  . lidocaine (cardiac) 100 mg/14ml      . metroNIDAZOLE      . metronidazole  500 mg Intravenous Q8H  . potassium chloride  10 mEq Intravenous Q1 Hr x 4  . potassium chloride      . rocuronium      . succinylcholine      . vancomycin (VANCOCIN) rectal ENEMA  500 mg Rectal Q8H  . vancomycin  750 mg Intravenous Q M,W,F-HD  . DISCONTD: vancomycin  250 mg Oral Q6H  . DISCONTD: vancomycin  750 mg Intravenous Q M,W,F-HD   Ct Abdomen Pelvis W Contrast  08/27/2011  *RADIOLOGY REPORT*  Clinical Data: Abdominal pain.  Septic.  CT ABDOMEN AND PELVIS WITH CONTRAST  Technique:  Multidetector CT imaging of the abdomen and pelvis was performed following the standard protocol during bolus administration of intravenous contrast.  Contrast: 40mL OMNIPAQUE IOHEXOL 300 MG/ML  SOLN, OMNIPAQUE IOHEXOL 300 MG/ML  SOLN  Comparison: CT scan 08/13/2011.  Findings: The lung bases demonstrate bilateral pleural effusions and bilateral infiltrates.  The liver is unremarkable and stable.  No focal lesions or intrahepatic biliary dilatation.  Gallstones and gallbladder sludge are suspected.  The pancreas is grossly normal.  The spleen is normal in size.  No focal lesions.  The adrenal glands demonstrate mild nodular enlargement suggesting nodular hyperplasia.   The kidneys demonstrate multiple bilateral rim calcified cysts. The right kidney is small and scarred.  The stomach is distended with contrast.  No obvious abnormality. There is a cluster of small bowel loops in the right mid abdomen which demonstrate mild distention.  Findings suspicious for an early closed loop obstruction or possible compartmentalized small bowel. No pneumatosis or significant wall thickening.  There is enhancement of the mucosa and the vascular pedicle demonstrates enhancement.  The remaining small bowel loops are normal in caliber. The colon is grossly normal.  There is diffuse and fairly marked rectal wall thickening.  The aorta demonstrates advanced atherosclerotic changes.  No focal aneurysm or dissection.  The major branch vessels demonstrate severe atherosclerotic calcifications.  The bladder is grossly normal.  The prostate gland and seminal vesicles are unremarkable.  There is a small amount of free pelvic fluid.  Examination of the bony structures demonstrates stable lytic lesions.  IMPRESSION:  1.  Findings concerning for an early closed loop obstruction with compartmentalized small bowel loops.  Recommend surgical consultation. 2.  Diffuse and marked rectal wall thickening.  This could be infectious or inflammatory.  3.  Severe atherosclerotic changes involving the aorta and branch vessels. 4.  Bilateral infiltrates and effusions. 5.  Severe cystic kidney disease.  Original Report Authenticated By:  Cyndie Chime, M.D.   Dg Chest Port 1 View  08/28/2011  *RADIOLOGY REPORT*  Clinical Data: Intubated patient.  PORTABLE CHEST - 1 VIEW  Comparison: Portable chest 08/27/2011.  Findings: Support tubes and lines are unchanged.  Extensive bilateral airspace disease persists.  Aeration in the right chest has worsened.  Right greater than left pleural effusions noted.  IMPRESSION: Extensive bilateral airspace disease shows worsening on the right.  Original Report Authenticated By: Bernadene Bell. D'ALESSIO, M.D.   Dg Chest Portable 1 View  08/27/2011  *RADIOLOGY REPORT*  Clinical Data: Line placement.  PORTABLE CHEST - 1 VIEW  Comparison: 08/26/2011.  Findings: The support apparatus is in good position.  The endotracheal tube is now 4.6 cm above the carina.  Interval progressive airspace consolidation in the left lung could reflect worsening edema or infiltrate.  Small effusions are stable.  IMPRESSION:  1.  Support apparatus in good position without complicating features. 2.  Worsening airspace consolidation, left greater than right could reflect worsening edema or infiltrate. 3.  Stable small effusions.  Original Report Authenticated By: P. Loralie Champagne, M.D.   Dg Chest Port 1 View  08/26/2011  *RADIOLOGY REPORT*  Clinical Data: Endotracheal tube placement.  PORTABLE CHEST - 1 VIEW  Comparison: 08/16/2011.  Findings: The endotracheal tube is right at the right mainstem bronchus and should be retracted 3 cm.  The dialysis catheter is stable.  The NG tube is in the stomach.  There is pulmonary edema, areas of atelectasis and a right pleural effusion.  IMPRESSION: The endotracheal tube is at the right mainstem bronchus and should be retracted 3 cm.  Original Report Authenticated By: P. Loralie Champagne, M.D.   Dg Abd Portable 2v  08/28/2011  *RADIOLOGY REPORT*  Clinical Data: Abdominal distention.  Question ileus or small bowel obstruction.  PORTABLE ABDOMEN - 2 VIEW  Comparison: CT abdomen pelvis 08/27/2011 and plain film of the abdomen 08/20/2011.  Findings: The abdomen is nearly gasless.  NG tube and right groin catheter are in place.  Calcified cystic lesion in the left upper quadrant as seen on CT scan again noted.  Multiple calcifications in the spleen are also again seen.  IMPRESSION: The abdomen is nearly gasless making assessment of small bowel difficult.  No definite acute finding.  Original Report Authenticated By: Bernadene Bell. Maricela Curet, M.D.   BMET    Component Value Date/Time   NA 139  08/28/2011 0540   K 3.6 08/28/2011 0540   CL 102 08/28/2011 0540   CO2 26 08/28/2011 0540   GLUCOSE 83 08/28/2011 0540   BUN 29* 08/28/2011 0540   CREATININE 6.09* 08/28/2011 0540   CALCIUM 8.6 08/28/2011 0540   GFRNONAA 9* 08/28/2011 0540   GFRAA 10* 08/28/2011 0540   CBC    Component Value Date/Time   WBC 7.8 08/28/2011 0540   RBC 2.61* 08/28/2011 0540   HGB 7.5* 08/28/2011 0540   HCT 23.7* 08/28/2011 0540   PLT 153 08/28/2011 0540   MCV 90.8 08/28/2011 0540   MCH 28.7 08/28/2011 0540   MCHC 31.6 08/28/2011 0540   RDW 17.6* 08/28/2011 0540   LYMPHSABS 0.6* 08/26/2011 2306   MONOABS 0.4 08/26/2011 2306   EOSABS 0.0 08/26/2011 2306   BASOSABS 0.1 08/26/2011 2306     Assessment: 1. Sepsis Syndrome 2. PNA 3. Bowel obstruction vs ileus 4. Hypotension, improved 5. ESRD  Plan: 1.  Will try HD today as his BP is better and fluid requirements are low.  If unable to  tolerate HD then would need CVVHD 2. Cont empiric AB 3. Await cultures 4. Recheck labs in AM    Thelia Tanksley T

## 2011-08-28 NOTE — Progress Notes (Addendum)
Subjective: Requiring less pressor support, just on levaphed now.   More arousable. Denies abdominal pain at rest. Denies abdominal tenderness during exam and palpation. No stools overnight.  Abdominal x-rays this morning showed a featureless abdomen. No free air noted. No evidence of obstruction noted. Suspect fluid-filled bowel loops are present. NG tube  looks well positioned.  Labs noted. Normal WBC,suggesting immune compromised state.  Objective: Vital signs in last 24 hours: Temp:  [97.7 F (36.5 C)-103 F (39.4 C)] 97.7 F (36.5 C) (06/06 0410) Pulse Rate:  [80-120] 81  (06/06 0430) Resp:  [14-30] 18  (06/06 0329) BP: (105-124)/(40-63) 113/63 mmHg (06/06 0329) SpO2:  [89 %-100 %] 100 % (06/06 0430) Arterial Line BP: (90-160)/(41-79) 114/63 mmHg (06/06 0430) FiO2 (%):  [49.4 %-100 %] 49.4 % (06/06 0430) Weight:  [166 lb 14.2 oz (75.7 kg)] 166 lb 14.2 oz (75.7 kg) (06/06 0500)    Intake/Output from previous day: 06/05 0701 - 06/06 0700 In: 847.8 [I.V.:347.8; IV Piggyback:500] Out: -  Intake/Output this shift: Total I/O In: 360.7 [I.V.:110.7; IV Piggyback:250] Out: -   General appearance: intubated. Lethargic. Arousable. Does follow a few commands this morning. Answers simple questions regarding pain. Skin warm and dry. GI: abdomen is somewhat distended and firm. No bowel sounds. No guarding. No rebound. No mass.  Lab Results:   Basename 08/28/11 0540 08/27/11 0301  WBC 7.8 5.1  HGB 7.5* 8.1*  HCT 23.7* 26.4*  PLT 153 230   BMET  Basename 08/27/11 1600 08/27/11 0301  NA 139 141  K 4.3 3.0*  CL 101 102  CO2 25 26  GLUCOSE 112* 111*  BUN 26* 24*  CREATININE 5.51* 5.10*  CALCIUM 8.3* 8.1*   PT/INR  Basename 08/27/11 0301  LABPROT 17.2*  INR 1.38   ABG  Basename 08/28/11 0535 08/27/11 0001  PHART 7.342* 7.401  HCO3 27.3* 31.1*    Studies/Results: Ct Abdomen Pelvis W Contrast  08/27/2011  *RADIOLOGY REPORT*  Clinical Data: Abdominal pain.   Septic.  CT ABDOMEN AND PELVIS WITH CONTRAST  Technique:  Multidetector CT imaging of the abdomen and pelvis was performed following the standard protocol during bolus administration of intravenous contrast.  Contrast: 40mL OMNIPAQUE IOHEXOL 300 MG/ML  SOLN, OMNIPAQUE IOHEXOL 300 MG/ML  SOLN  Comparison: CT scan 08/13/2011.  Findings: The lung bases demonstrate bilateral pleural effusions and bilateral infiltrates.  The liver is unremarkable and stable.  No focal lesions or intrahepatic biliary dilatation.  Gallstones and gallbladder sludge are suspected.  The pancreas is grossly normal.  The spleen is normal in size.  No focal lesions.  The adrenal glands demonstrate mild nodular enlargement suggesting nodular hyperplasia.  The kidneys demonstrate multiple bilateral rim calcified cysts. The right kidney is small and scarred.  The stomach is distended with contrast.  No obvious abnormality. There is a cluster of small bowel loops in the right mid abdomen which demonstrate mild distention.  Findings suspicious for an early closed loop obstruction or possible compartmentalized small bowel. No pneumatosis or significant wall thickening.  There is enhancement of the mucosa and the vascular pedicle demonstrates enhancement.  The remaining small bowel loops are normal in caliber. The colon is grossly normal.  There is diffuse and fairly marked rectal wall thickening.  The aorta demonstrates advanced atherosclerotic changes.  No focal aneurysm or dissection.  The major branch vessels demonstrate severe atherosclerotic calcifications.  The bladder is grossly normal.  The prostate gland and seminal vesicles are unremarkable.  There is a small  amount of free pelvic fluid.  Examination of the bony structures demonstrates stable lytic lesions.  IMPRESSION:  1.  Findings concerning for an early closed loop obstruction with compartmentalized small bowel loops.  Recommend surgical consultation. 2.  Diffuse and marked rectal  wall thickening.  This could be infectious or inflammatory.  3.  Severe atherosclerotic changes involving the aorta and branch vessels. 4.  Bilateral infiltrates and effusions. 5.  Severe cystic kidney disease.  Original Report Authenticated By: P. Loralie Champagne, M.D.   Dg Chest Portable 1 View  08/27/2011  *RADIOLOGY REPORT*  Clinical Data: Line placement.  PORTABLE CHEST - 1 VIEW  Comparison: 08/26/2011.  Findings: The support apparatus is in good position.  The endotracheal tube is now 4.6 cm above the carina.  Interval progressive airspace consolidation in the left lung could reflect worsening edema or infiltrate.  Small effusions are stable.  IMPRESSION:  1.  Support apparatus in good position without complicating features. 2.  Worsening airspace consolidation, left greater than right could reflect worsening edema or infiltrate. 3.  Stable small effusions.  Original Report Authenticated By: P. Loralie Champagne, M.D.   Dg Chest Port 1 View  08/26/2011  *RADIOLOGY REPORT*  Clinical Data: Endotracheal tube placement.  PORTABLE CHEST - 1 VIEW  Comparison: 08/16/2011.  Findings: The endotracheal tube is right at the right mainstem bronchus and should be retracted 3 cm.  The dialysis catheter is stable.  The NG tube is in the stomach.  There is pulmonary edema, areas of atelectasis and a right pleural effusion.  IMPRESSION: The endotracheal tube is at the right mainstem bronchus and should be retracted 3 cm.  Original Report Authenticated By: P. Loralie Champagne, M.D.    Anti-infectives: Anti-infectives     Start     Dose/Rate Route Frequency Ordered Stop   08/29/11 1200   vancomycin (VANCOCIN) 750 mg in sodium chloride 0.9 % 150 mL IVPB        750 mg 150 mL/hr over 60 Minutes Intravenous Every M-W-F (Hemodialysis) 08/27/11 1157     08/27/11 2200  imipenem-cilastatin (PRIMAXIN) 250 mg in sodium chloride 0.9 % 100 mL IVPB       250 mg 200 mL/hr over 30 Minutes Intravenous Every 12 hours 08/27/11 0255       08/27/11 1500   vancomycin (VANCOCIN) 500 mg in sodium chloride irrigation 0.9 % 100 mL ENEMA     Comments: Retain for 1 hour      500 mg Rectal Every 8 hours 08/27/11 1404     08/27/11 1200   metroNIDAZOLE (FLAGYL) IVPB 500 mg        500 mg 100 mL/hr over 60 Minutes Intravenous Every 8 hours 08/27/11 0255     08/27/11 1200   vancomycin (VANCOCIN) 750 mg in sodium chloride 0.9 % 150 mL IVPB  Status:  Discontinued        750 mg 150 mL/hr over 60 Minutes Intravenous Every M-W-F (Hemodialysis) 08/27/11 0255 08/27/11 1157   08/27/11 0600   vancomycin (VANCOCIN) 50 mg/mL oral solution 250 mg  Status:  Discontinued        250 mg Oral 4 times per day 08/27/11 0255 08/27/11 1404   08/27/11 0400   vancomycin (VANCOCIN) 500 mg in sodium chloride 0.9 % 100 mL IVPB        500 mg 100 mL/hr over 60 Minutes Intravenous NOW 08/27/11 0255 08/27/11 0530   08/27/11 0230  imipenem-cilastatin (PRIMAXIN) 500 mg in sodium chloride 0.9 % 100  mL IVPB       500 mg 200 mL/hr over 30 Minutes Intravenous  Once 08/27/11 0220 08/27/11 0350   08/27/11 0230   metroNIDAZOLE (FLAGYL) IVPB 500 mg        500 mg 100 mL/hr over 60 Minutes Intravenous  Once 08/27/11 0220 08/27/11 0343   08/27/11 0230   vancomycin (VANCOCIN) IVPB 1000 mg/200 mL premix        1,000 mg 200 mL/hr over 60 Minutes Intravenous  Once 08/27/11 0220 08/27/11 0348   08/27/11 0129   metroNIDAZOLE (FLAGYL) 5-0.79 MG/ML-% IVPB     Comments: PREYER, ELIZABETH: cabinet override         08/27/11 0129 08/27/11 1329          Assessment/Plan:  Abdominal distention. Suspect this is ileus. Intra-abdominal sepsis seems unlikely given clinical, and radiographic evaluation at this point. Exam this morning is somewhat more reassuring. Will need to be followed closely. Continue NG.  Septic shock, improving with less pressor support required. Pneumonia seems to be the likely source. Recent C. Difficile colitis is also in the picture. Central line sepsis  is always a possibility.  End-stage renal disease.  Prognosis guarded.   LOS: 2 days    Happy Begeman M. Derrell Lolling, M.D., [facility] General and Minimally invasive Surgery Breast and Colorectal Surgery Office:   [phone] Pager:   [phone]  08/28/2011

## 2011-08-28 NOTE — Significant Event (Signed)
CRITICAL VALUE ALERT  Critical value received:  Positive C. Diff  Date of notification:  08/28/11  Time of notification:  1000  Critical value read back:yes  Nurse who received alert:  Orma Flaming, RN  MD notified (1st page):  [place]    Time of first page:  1000  MD notified (2nd page):  Time of second page:  Responding MD:  Delton Coombes  Time MD responded:  1000

## 2011-08-28 NOTE — Progress Notes (Signed)
Name: [name] MRN: [id number] DOB: [birth year]    LOS: 2  Referring Provider:  Norlene Campbell EDP Reason for Referral:  Severe Sepsis, Respiratory failure  PULMONARY / CRITICAL CARE MEDICINE  Brief patient description:  61 y/o male with ESRD and severe sepsis likely from aspiration pneumonia and possibly SBO vs. Perforated bowel vs. Recurrent c.diff colitis. Admitted 6/5  Events Since Admission: 6/5 CT abdomen >> Concerning for early closed loop obstruction with compartmentalized small bowel loops. Marked rectal wall thickening  Current Status: Remains intubated on the vent minimal amount of levophed and Dobutamine    Vital Signs: Temp:  [97.7 F (36.5 C)-103 F (39.4 C)] 97.7 F (36.5 C) (06/06 0813) Pulse Rate:  [77-120] 80  (06/06 0715) Resp:  [14-30] 17  (06/06 0730) BP: (105-124)/(40-63) 113/63 mmHg (06/06 0329) SpO2:  [92 %-100 %] 100 % (06/06 0730) Arterial Line BP: (81-160)/(37-79) 124/47 mmHg (06/06 0730) FiO2 (%):  [49.4 %-100 %] 49.9 % (06/06 0730) Weight:  [75.7 kg (166 lb 14.2 oz)] 75.7 kg (166 lb 14.2 oz) (06/06 0500)  Physical Examination: Gen: elderly male intubated HEENT: ETT, OGT PULM: Insp crackles on L CV: Tachy, no mgr, no JVD AB: No bowel sounds, tender diffusely and distended Ext: edema noted in legs,  Derm: sacral skin breakdown Neuro: GCS 10T, no focal deficits    Principal Problem:  *Septic shock Active Problems:  Chronic diastolic CHF (congestive heart failure)  ESRD (end stage renal disease)  C. difficile colitis  CAD (coronary artery disease)  Acute respiratory failure with hypoxia  Nausea and vomiting  Constipation  Hospital-acquired pneumonia  Acute encephalopathy   ASSESSMENT AND PLAN  PULMONARY  Lab 08/28/11 0535 08/27/11 2345 08/27/11 0815 08/27/11 0615 08/27/11 0242 08/27/11 0001  PHART 7.342* -- -- -- -- 7.401  PCO2ART 50.0* -- -- -- -- 50.1*  PO2ART 86.0 -- -- -- -- 284.0*  HCO3 27.3* -- -- -- -- 31.1*  O2SAT 96.0  76.5 59.0 53.5 55.1 --   Ventilator Settings:  Vent Mode:  [-] PRVC FiO2 (%):  [49.4 %-100 %] 49.9 % Set Rate:  [18 bmp] 18 bmp Vt Set:  [390 mL] 390 mL PEEP:  [10 cmH20] 10 cmH20 Plateau Pressure:  [22 cmH20-24 cmH20] 22 cmH20 CXR:  6/6: increased bilateral airspace disease L > R ETT:  6/4 >>  A:  Acute respiratory failure due to likely aspiration vs. HAP P:   -full vent support, 6cc/kg -daily WUA, and SBT -avoid benzos if possible -fentanyl drip  -rx PNA as below  CARDIOVASCULAR  Lab 08/28/11 0540 08/27/11 1600 08/27/11 0930 08/27/11 0250 08/27/11 0008 08/26/11 2305  TROPONINI -- 0.77* 0.71* 0.44* -- --  LATICACIDVEN -- -- -- -- 2.0 --  PROBNP 21860.0* -- 19652.0* -- -- 25900.0*    Lab 08/28/11 0540 08/27/11 0249 08/26/11 2329  PROCALCITON 29.66 12.92 15.17   ECG:  NSR, IVCD, no clear ST wave changes Lines:  6/5 R Fem CVL >> 6/5 R Fem A-line >> L Subclavian tunneled HD cath ?>>>  A: Septic shock- improving P:  -cannot measure CVP d/t CVL in fem.  -see antibiotics below -wean dobutamine to off, continue to decrease levophed to SBP > 90 -Continue to trend BNP -tele  RENAL  Lab 08/28/11 0540 08/27/11 1600 08/27/11 0301 08/26/11 2315 08/26/11 2306 08/23/11 1100 08/22/11 0615  NA 139 139 141 139 139 -- --  K 3.6 4.3 -- -- -- -- --  CL 102 101 102 95* 93* -- --  CO2  26 25 26  -- 29 27 --  BUN 29* 26* 24* 25* 26* -- --  CREATININE 6.09* 5.51* 5.10* 5.80* 5.51* -- --  CALCIUM 8.6 8.3* 8.1* -- 9.6 8.8 --  MG -- -- -- -- -- -- --  PHOS 5.9* -- -- -- -- 4.8* 5.3*   Intake/Output      06/05 0701 - 06/06 0700 06/06 0701 - 06/07 0700   I.V. (mL/kg) 395.5 (5.2)    Blood     IV Piggyback 600    Total Intake(mL/kg) 995.5 (13.2)    Net +995.5          Foley:    A:  ESRD, last outpatient HD 6/3;  P:   -Renal will try HD today 6/6, if does not tolerate will do CRRT  A: hypokalemia-resolved P: - f/u BMP  GASTROINTESTINAL  Lab 08/28/11 0540 08/26/11 2306  08/23/11 1100 08/22/11 0615  AST 17 22 -- --  ALT 8 10 -- --  ALKPHOS 50 73 -- --  BILITOT 0.5 0.4 -- --  PROT 4.6* 6.1 -- --  ALBUMIN 1.4* 2.1* 1.8* 1.9*    A:  Nausea, vomiting, constipation,  C.diff-- CT 6/5 shows possible closed loop obstruction P:   -OG tube -NPO -CCS >> no surgical intervention at this time, following  HEMATOLOGIC  Lab 08/28/11 0540 08/27/11 0301 08/26/11 2315 08/26/11 2306 08/23/11 1100 08/22/11 0629  HGB 7.5* 8.1* 10.2* 8.9* 6.6* --  HCT 23.7* 26.4* 30.0* 28.8* 20.9* --  PLT 153 230 -- 280 218 206  INR -- 1.38 -- -- -- --  APTT -- 35 -- -- -- --   A:  Chronic anemia P:  -monitor CBC  INFECTIOUS  Lab 08/28/11 0540 08/27/11 0301 08/27/11 0249 08/26/11 2329 08/26/11 2306 08/23/11 1100 08/22/11 0629  WBC 7.8 5.1 -- -- 13.6* 15.1* 15.1*  PROCALCITON 29.66 -- 12.92 15.17 -- -- --   Cultures: 6/4 Blood >> 6/4 tracheal culture >> 6/6 repeat C.Diff >>> positive Antibiotics: 6/5 Imipenem (HAP/abdominal sepsis)>> 6/5 Vanc (HAP) >> 6/5 Rectal Vanc (c.diff) >> 6/5 Flagyl (c.diff) >>  A:  Known c.diff diagnosed 07/2011 admission, plan was for flagyl to finish on 6/8 P:   -continue rectal vanco -continue flagyl -would continue for at least 7 more days after d/c IV vanc/imipenem  A: HAP vs. Aspiration pneumonia P: -vanc/imipenem -respiratory culture pending  ENDOCRINE  Lab 08/28/11 0405 08/28/11 0016 08/27/11 1945 08/27/11 1633 08/27/11 1223  GLUCAP 82 100* 103* 95 101*   A:  No acute issues   P:   -monitor cbg  NEUROLOGIC  A:  Encephalopathy due to sepsis P:   -supportive care -fentanyl gtt on vent -prn versed   BEST PRACTICE / DISPOSITION Level of Care:  ICU Primary Service:  PCCM Consultants:   Code Status:  Full per family discussion 6/4, will continue to address Diet:  NPO DVT Px:  scd GI Px:  pepcid Skin Integrity: sacral pressure wound noted on admission Social / Family:  Discussed w family at bedside  CC time 45  minutes  Levy Pupa, MD, PhD 08/28/2011, 12:18 PM [facility] [phone] or if no answer [phone]

## 2011-08-28 NOTE — Progress Notes (Signed)
ANTIBIOTIC CONSULT NOTE - INITIAL  Pharmacy Consult for vancomcycin iv, imipenem, flagyl Indication: sepsis - CDiff and HCAP  No Known Allergies  Patient Measurements: Height: 5' 6.14" (168 cm) Weight: 166 lb 14.2 oz (75.7 kg) IBW/kg (Calculated) : 64.13     Vital Signs: Temp: 97.7 F (36.5 C) (06/06 1207) Temp src: Oral (06/06 1207) BP: 113/63 mmHg (06/06 0329) Pulse Rate: 85  (06/06 0900) Intake/Output from previous day: 06/05 0701 - 06/06 0700 In: 1008 [I.V.:408; IV Piggyback:600] Out: -  Intake/Output from this shift: Total I/O In: 30.6 [I.V.:30.6] Out: -   Labs:  Basename 08/28/11 0540 08/27/11 1600 08/27/11 0301 08/26/11 2315 08/26/11 2306  WBC 7.8 -- 5.1 -- 13.6*  HGB 7.5* -- 8.1* 10.2* --  PLT 153 -- 230 -- 280  LABCREA -- -- -- -- --  CREATININE 6.09* 5.51* 5.10* -- --   Estimated Creatinine Clearance: 11.4 ml/min (by C-G formula based on Cr of 6.09). No results found for this basename: VANCOTROUGH:2,VANCOPEAK:2,VANCORANDOM:2,GENTTROUGH:2,GENTPEAK:2,GENTRANDOM:2,TOBRATROUGH:2,TOBRAPEAK:2,TOBRARND:2,AMIKACINPEAK:2,AMIKACINTROU:2,AMIKACIN:2, in the last 72 hours   Microbiology: Recent Results (from the past 720 hour(s))  CULTURE, BLOOD (ROUTINE X 2)     Status: Normal   Collection Time   08/13/11  3:15 PM      Component Value Range Status Comment   Specimen Description BLOOD CENTRAL LINE   Final    Special Requests BOTTLES DRAWN AEROBIC AND ANAEROBIC   Final    Culture  Setup Time [id number]   Final    Culture     Final    Value: DIPHTHEROIDS(CORYNEBACTERIUM SPECIES)     Note: Standardized susceptibility testing for this organism is not available.     Note: Gram Stain Report Called to,Read Back By and Verified With: GENIVEVE WROBLEWSKI @1150  08/15/11 BY KRAWS   Report Status 08/16/2011 FINAL   Final   CULTURE, BLOOD (ROUTINE X 2)     Status: Normal   Collection Time   08/13/11  3:50 PM      Component Value Range Status Comment   Specimen  Description BLOOD HAND RIGHT   Final    Special Requests BOTTLES DRAWN AEROBIC ONLY 5CC   Final    Culture  Setup Time [id number]   Final    Culture NO GROWTH 5 DAYS   Final    Report Status 08/20/2011 FINAL   Final   MRSA PCR SCREENING     Status: Normal   Collection Time   08/13/11  6:50 PM      Component Value Range Status Comment   MRSA by PCR NEGATIVE  NEGATIVE  Final   CLOSTRIDIUM DIFFICILE BY PCR     Status: Abnormal   Collection Time   08/13/11 10:46 PM      Component Value Range Status Comment   C difficile by pcr POSITIVE (*) NEGATIVE  Final   BODY FLUID CULTURE     Status: Normal   Collection Time   08/15/11  5:58 PM      Component Value Range Status Comment   Specimen Description PLEURAL FLUID RIGHT   Final    Special Requests NONE   Final    Gram Stain     Final    Value: RARE WBC PRESENT,BOTH PMN AND MONONUCLEAR     NO ORGANISMS SEEN   Culture NO GROWTH 3 DAYS   Final    Report Status 08/19/2011 FINAL   Final   CULTURE, BLOOD (ROUTINE X 2)     Status: Normal (Preliminary result)   Collection Time  08/26/11 11:49 PM      Component Value Range Status Comment   Specimen Description BLOOD LEFT ARM   Final    Special Requests BOTTLES DRAWN AEROBIC ONLY 2CC   Final    Culture  Setup Time [id number]   Final    Culture     Final    Value:        BLOOD CULTURE RECEIVED NO GROWTH TO DATE CULTURE WILL BE HELD FOR 5 DAYS BEFORE ISSUING A FINAL NEGATIVE REPORT   Report Status PENDING   Incomplete   CULTURE, BLOOD (ROUTINE X 2)     Status: Normal (Preliminary result)   Collection Time   08/26/11 11:56 PM      Component Value Range Status Comment   Specimen Description BLOOD LEFT HAND   Final    Special Requests BOTTLES DRAWN AEROBIC ONLY 3CC   Final    Culture  Setup Time [id number]   Final    Culture     Final    Value:        BLOOD CULTURE RECEIVED NO GROWTH TO DATE CULTURE WILL BE HELD FOR 5 DAYS BEFORE ISSUING A FINAL NEGATIVE REPORT   Report Status PENDING    Incomplete   MRSA PCR SCREENING     Status: Normal   Collection Time   08/27/11  2:55 AM      Component Value Range Status Comment   MRSA by PCR NEGATIVE  NEGATIVE  Final   CLOSTRIDIUM DIFFICILE BY PCR     Status: Abnormal   Collection Time   08/27/11  8:54 PM      Component Value Range Status Comment   C difficile by pcr POSITIVE (*) NEGATIVE  Final    Assessment: 62 yo with PVD and ESRD dc home on 6/1 after severe sepsis due to diff. Doing well until 6/4 when he developed n/v resp distress and confusion. Last HD 6/3  Goal of Therapy:  Vancomycin trough level 15-20 mcg/ml Renal adjust imipenem  Plan: HD ordered for today - will give vancomycin 750mg  after HD.  Continue same imipenem and flagyl dose. Will continue to monitor HD tolerance and cultures.  Celedonio Miyamoto, PharmD, BCPS Clinical Pharmacist Pager [phone]  08/28/2011,1:05 PM

## 2011-08-28 NOTE — Progress Notes (Signed)
INITIAL ADULT NUTRITION ASSESSMENT Date: 08/28/2011   Time: 9:48 AM  Reason for Assessment: VDRF  ASSESSMENT: Male 61 y.o.  Dx: Septic shock  Hx:  Past Medical History  Diagnosis Date  . Hyperlipidemia   . ESRD (end stage renal disease)     a. MWF dialysis in [place] (followed by Dr. Kristian Covey)  . Arthritis   . Claudication   . Stroke 2005  . Anemia   . Chronic diastolic CHF (congestive heart failure) 07/2010    a. 07/2008 Echo EF 50-55%, mild-mod LVH, Gr 1 DD.  Marland Kitchen Peripheral vascular occlusive disease     a. 07/2011 Periph Angio: No signif Ao-illiac dzs, LCF stenosis, 100% LSFA w recon above knee pop and 3 vessel runoff, 100% RSFA w/ recon above knee --> pending L Fem to below Knee pop bypass.  Marland Kitchen History of tobacco abuse   . Hypertension     takes Amlodipine,Metoprolol,and Prinivil daily  . Dyspnea on exertion     with exertion    Related Meds:  Scheduled Meds:   . chlorhexidine      . darbepoetin (ARANESP) injection - DIALYSIS  100 mcg Intravenous Q Thu-HD  . famotidine (PEPCID) IV  20 mg Intravenous Q12H  . imipenem-cilastatin  250 mg Intravenous Q12H  . lidocaine (cardiac) 100 mg/86ml      . metroNIDAZOLE      . metronidazole  500 mg Intravenous Q8H  . potassium chloride  10 mEq Intravenous Q1 Hr x 4  . potassium chloride      . rocuronium      . succinylcholine      . vancomycin (VANCOCIN) rectal ENEMA  500 mg Rectal Q8H  . vancomycin  750 mg Intravenous Q M,W,F-HD  . DISCONTD: vancomycin  250 mg Oral Q6H  . DISCONTD: vancomycin  750 mg Intravenous Q M,W,F-HD   Continuous Infusions:   . sodium chloride 100 mL/hr at 08/27/11 0230  . fentaNYL infusion INTRAVENOUS 100 mcg/hr (08/28/11 0000)  . midazolam (VERSED) infusion 1 mg/hr (08/27/11 1640)  . vasopressin (PITRESSIN) infusion - *FOR SHOCK* 0.03 Units/min (08/27/11 0645)   PRN Meds:.sodium chloride, DOBUTamine, fentaNYL, midazolam, norepinephrine (LEVOPHED) Adult infusion, sodium chloride, vasopressin  (PITRESSIN) infusion - *FOR SHOCK*   Ht: 5' 6.14" (168 cm)  Wt: 166 lb 14.2 oz (75.7 kg)  Ideal Wt: 64.5 kg % Ideal Wt: 117%  Wt Readings from Last 15 Encounters:  08/28/11 166 lb 14.2 oz (75.7 kg)  08/23/11 147 lb 11.3 oz (67 kg)  08/11/11 157 lb (71.215 kg)  08/05/11 157 lb (71.215 kg)  08/04/11 152 lb 6.4 oz (69.128 kg)  08/01/11 134 lb (60.782 kg)  08/01/11 134 lb (60.782 kg)  07/31/11 144 lb (65.318 kg)  07/31/11 147 lb (66.679 kg)  07/29/11 155 lb (70.308 kg)  07/29/11 155 lb (70.308 kg)  07/21/11 155 lb 4.8 oz (70.444 kg)  06/17/11 170 lb (77.111 kg)  04/06/11 157 lb (71.215 kg)  08/10/08 178 lb 12.8 oz (81.103 kg)    Usual Wt: 135-155 lb % Usual Wt: ~115%  Suspect weight fluctuates with fluid status given ESRD.  Body mass index is 26.82 kg/(m^2).  Food/Nutrition Related Hx: unknown  Labs:  CMP     Component Value Date/Time   NA 139 08/28/2011 0540   K 3.6 08/28/2011 0540   CL 102 08/28/2011 0540   CO2 26 08/28/2011 0540   GLUCOSE 83 08/28/2011 0540   BUN 29* 08/28/2011 0540   CREATININE 6.09* 08/28/2011 0540   CALCIUM  8.6 08/28/2011 0540   PROT 4.6* 08/28/2011 0540   ALBUMIN 1.4* 08/28/2011 0540   AST 17 08/28/2011 0540   ALT 8 08/28/2011 0540   ALKPHOS 50 08/28/2011 0540   BILITOT 0.5 08/28/2011 0540   GFRNONAA 9* 08/28/2011 0540   GFRAA 10* 08/28/2011 0540     CBG (last 3)   Basename 08/28/11 0813 08/28/11 0405 08/28/11 0016  GLUCAP 90 82 100*      Intake/Output Summary (Last 24 hours) at 08/28/11 1002 Last data filed at 08/28/11 0936  Gross per 24 hour  Intake  868.2 ml  Output      0 ml  Net  868.2 ml     Diet Order: NPO  Supplements/Tube Feeding:  None  IVF:    sodium chloride Last Rate: 100 mL/hr at 08/27/11 0230  fentaNYL infusion INTRAVENOUS Last Rate: 100 mcg/hr (08/28/11 0000)  midazolam (VERSED) infusion Last Rate: 1 mg/hr (08/27/11 1640)  vasopressin (PITRESSIN) infusion - *FOR SHOCK* Last Rate: 0.03 Units/min (08/27/11 0645)    Estimated  Nutritional Needs:   Kcal: 1750 Protein: 75-90 grams Fluid: 1.2 liters  Patient with severe sepsis likely from aspiration pneumonia.  Found to have recurrent C. Diff colitis.  Abdominal distention suspected to be related to ileus per CCS.  NG tube to remain in place.  Per Renal, plans are to try HD today, if does not tolerate will do CRRT.  Noted guarded prognosis.    NUTRITION DIAGNOSIS: -Inadequate oral intake (NI-2.1).  Status: Ongoing  RELATED TO: inability to eat  AS EVIDENCED BY: NPO status  MONITORING/EVALUATION(Goals): Goal:  Intake to meet 90-100% of estimated nutrition needs.  Enteral route is preferred.  Recommend start enteral nutrition at 10 ml/h to maintain gut function within the next 24 hours.  Monitor for ability to begin enteral nutrition, labs, weight trend, renal function, overall goals of care.  EDUCATION NEEDS: -Education not appropriate at this time  INTERVENTION: When able to start enteral nutrition, recommend Nepro via NG tube at 10 ml/h, increase by 10 ml every 4 hours to goal rate of 40 ml/h to provide 1728 kcals, 78 grams protein, 698 ml free water daily.    Dietitian #:  [phone]  DOCUMENTATION CODES Per approved criteria  -Not Applicable    Hettie Holstein 08/28/2011, 9:48 AM

## 2011-08-29 LAB — GLUCOSE, CAPILLARY
Glucose-Capillary: 75 mg/dL (ref 70–99)
Glucose-Capillary: 128 mg/dL — ABNORMAL HIGH (ref 70–99)
Glucose-Capillary: 90 mg/dL (ref 70–99)

## 2011-08-29 LAB — COMPREHENSIVE METABOLIC PANEL
Alkaline Phosphatase: 60 U/L (ref 39–117)
BUN: 13 mg/dL (ref 6–23)
Creatinine, Ser: 3.6 mg/dL — ABNORMAL HIGH (ref 0.50–1.35)
GFR calc Af Amer: 19 mL/min — ABNORMAL LOW (ref >90)
Glucose, Bld: 75 mg/dL (ref 70–99)
Potassium: 3.7 mEq/L (ref 3.5–5.1)
Total Protein: 5.4 g/dL — ABNORMAL LOW (ref 6.0–8.3)

## 2011-08-29 LAB — CBC
HCT: 26.9 % — ABNORMAL LOW (ref 39.0–52.0)
Platelets: 168 10*3/uL (ref 150–400)
RBC: 2.94 MIL/uL — ABNORMAL LOW (ref 4.22–5.81)
RDW: 17.4 % — ABNORMAL HIGH (ref 11.5–15.5)
WBC: 11.2 10*3/uL — ABNORMAL HIGH (ref 4.0–10.5)

## 2011-08-29 MED ORDER — VANCOMYCIN HCL 1000 MG IV SOLR
750.0000 mg | INTRAVENOUS | Status: DC
  Administered 2011-08-30: 750 mg via INTRAVENOUS
  Filled 2011-08-29: qty 750

## 2011-08-29 MED ORDER — DEXTROSE-NACL 5-0.9 % IV SOLN
INTRAVENOUS | Status: DC
  Administered 2011-08-29: 08:00:00 via INTRAVENOUS

## 2011-08-29 MED ORDER — FAMOTIDINE IN NACL 20-0.9 MG/50ML-% IV SOLN
20.0000 mg | INTRAVENOUS | Status: DC
  Administered 2011-08-29 – 2011-09-01 (×4): 20 mg via INTRAVENOUS
  Filled 2011-08-29 (×5): qty 50

## 2011-08-29 MED ORDER — DEXTROSE 50 % IV SOLN
INTRAVENOUS | Status: AC
  Administered 2011-08-29: 09:00:00
  Filled 2011-08-29: qty 50

## 2011-08-29 NOTE — Procedures (Signed)
Extubation Procedure Note  Patient Details:   Name: [name] DOB: Sep 22, 1950 MRN: [id number]   Airway Documentation:    Evaluation  O2 sats: stable throughout Complications: No apparent complications Patient did tolerate procedure well. Bilateral Breath Sounds: Clear;Diminished Suctioning: Airway;Oral Yes pt able to vocalize after extubation.  Pt extubated at this time per MD order and tolerated well. Pt placed on 3L [place]. Pt able to breathe around deflated cuff.  VC of 1.0 L/min. No complications noted, No stridor noted. Pt has strong adequate cough and able to vocalize. VS stable: HR 94, RR 18, BP 140/60, SpO2 100% on 3L.  RT will continue to monitor.    Loyal Jacobson [facility] 08/29/2011, 3:21 PM

## 2011-08-29 NOTE — Progress Notes (Signed)
Subjective: Sedation has been withdrawn, and he is much more awake and alert and communicative. Remains intubated. Off all pressors. OG tube with bilious drainage.  C. Difficile positive, receiving vancomycin enemas.  Being treated for pneumonia. Pulmonary status apparently improving somewhat.  ESRD.  Received hemodialysis last mite and tolerated this.  Objective: Vital signs in last 24 hours: Temp:  [97.7 F (36.5 C)-98.5 F (36.9 C)] 98.5 F (36.9 C) (06/07 0000) Pulse Rate:  [67-94] 87  (06/07 0310) Resp:  [3-25] 19  (06/07 0310) BP: (106-145)/(43-54) 111/47 mmHg (06/07 0310) SpO2:  [95 %-100 %] 100 % (06/07 0310) Arterial Line BP: (81-167)/(38-79) 118/54 mmHg (06/07 0300) FiO2 (%):  [39.6 %-70 %] 40 % (06/07 0310) Weight:  [161 lb 6 oz (73.2 kg)-165 lb 9.1 oz (75.1 kg)] 161 lb 6 oz (73.2 kg) (06/06 2019)    Intake/Output from previous day: 06/06 0701 - 06/07 0700 In: 644.4 [I.V.:394.4; IV Piggyback:250] Out: 1000  Intake/Output this shift: Total I/O In: 429.6 [I.V.:179.6; IV Piggyback:250] Out: 1000 [Other:1000]  General appearance: alert. Answers questions appropriately. Mild agitation. Skin warm and dry. GI: abdomen distended but soft and nontender. Bowel sounds present. No mass or hernia detected.  Lab Results:  Results for orders placed during the hospital encounter of 08/26/11 (from the past 24 hour(s))  GLUCOSE, CAPILLARY     Status: Normal   Collection Time   08/28/11  8:13 AM      Component Value Range   Glucose-Capillary 90  70 - 99 (mg/dL)  GLUCOSE, CAPILLARY     Status: Normal   Collection Time   08/28/11 12:06 PM      Component Value Range   Glucose-Capillary 81  70 - 99 (mg/dL)  POCT I-STAT 3, BLOOD GAS (G3+)     Status: Abnormal   Collection Time   08/28/11  2:54 PM      Component Value Range   pH, Arterial 7.356  7.350 - 7.450    pCO2 arterial 47.9 (*) 35.0 - 45.0 (mmHg)   pO2, Arterial 81.0  80.0 - 100.0 (mmHg)   Bicarbonate 26.9 (*) 20.0 -  24.0 (mEq/L)   TCO2 28  0 - 100 (mmol/L)   O2 Saturation 96.0     Acid-Base Excess 1.0  0.0 - 2.0 (mmol/L)   Patient temperature 97.7 F     Collection site ARTERIAL LINE     Drawn by Operator     Sample type ARTERIAL    GLUCOSE, CAPILLARY     Status: Normal   Collection Time   08/28/11  4:02 PM      Component Value Range   Glucose-Capillary 76  70 - 99 (mg/dL)   Comment 1 Notify RN    GLUCOSE, CAPILLARY     Status: Abnormal   Collection Time   08/28/11  8:14 PM      Component Value Range   Glucose-Capillary 51 (*) 70 - 99 (mg/dL)   Comment 1 Documented in Chart     Comment 2 Notify RN    GLUCOSE, CAPILLARY     Status: Abnormal   Collection Time   08/28/11  8:46 PM      Component Value Range   Glucose-Capillary 108 (*) 70 - 99 (mg/dL)  GLUCOSE, CAPILLARY     Status: Normal   Collection Time   08/28/11 11:52 PM      Component Value Range   Glucose-Capillary 75  70 - 99 (mg/dL)  COMPREHENSIVE METABOLIC PANEL     Status: Abnormal  Collection Time   08/29/11  4:29 AM      Component Value Range   Sodium 138  135 - 145 (mEq/L)   Potassium 3.7  3.5 - 5.1 (mEq/L)   Chloride 101  96 - 112 (mEq/L)   CO2 27  19 - 32 (mEq/L)   Glucose, Bld 75  70 - 99 (mg/dL)   BUN 13  6 - 23 (mg/dL)   Creatinine, Ser 1.61 (*) 0.50 - 1.35 (mg/dL)   Calcium 8.7  8.4 - 09.6 (mg/dL)   Total Protein 5.4 (*) 6.0 - 8.3 (g/dL)   Albumin 1.7 (*) 3.5 - 5.2 (g/dL)   AST 31  0 - 37 (U/L)   ALT 8  0 - 53 (U/L)   Alkaline Phosphatase 60  39 - 117 (U/L)   Total Bilirubin 0.5  0.3 - 1.2 (mg/dL)   GFR calc non Af Amer 17 (*) >90 (mL/min)   GFR calc Af Amer 19 (*) >90 (mL/min)  CBC     Status: Abnormal   Collection Time   08/29/11  4:29 AM      Component Value Range   WBC 11.2 (*) 4.0 - 10.5 (K/uL)   RBC 2.94 (*) 4.22 - 5.81 (MIL/uL)   Hemoglobin 8.4 (*) 13.0 - 17.0 (g/dL)   HCT 04.5 (*) 40.9 - 52.0 (%)   MCV 91.5  78.0 - 100.0 (fL)   MCH 28.6  26.0 - 34.0 (pg)   MCHC 31.2  30.0 - 36.0 (g/dL)   RDW 81.1 (*) 91.4  - 15.5 (%)   Platelets 168  150 - 400 (K/uL)  GLUCOSE, CAPILLARY     Status: Normal   Collection Time   08/29/11  4:40 AM      Component Value Range   Glucose-Capillary 75  70 - 99 (mg/dL)     Studies/Results: @RISRSLT24 @     . darbepoetin      . darbepoetin (ARANESP) injection - DIALYSIS  100 mcg Intravenous Q Thu-HD  . dextrose      . famotidine (PEPCID) IV  20 mg Intravenous Q12H  . imipenem-cilastatin  250 mg Intravenous Q12H  . metronidazole  500 mg Intravenous Q8H  . vancomycin (VANCOCIN) rectal ENEMA  500 mg Rectal Q6H  . vancomycin  750 mg Intravenous Q M,W,F-HD  . vancomycin  750 mg Intravenous Once in dialysis  . DISCONTD: vancomycin (VANCOCIN) rectal ENEMA  500 mg Rectal Q8H     Assessment/Plan:  Abdominal distention. I think this is a simple ileus which should resolve as his sepsis and colitis resolves. Doubt small bowel obstruction.There is no indication for acute surgical intervention. There is no evidence for an abdominal source for his sepsis.  Recommend continued OG or NG drainage until he begins to have spontaneous GI function.  Will sign off. We'll be happy to see again in the future if any need arises.  Septic shock. This is most likely due to pneumonia and possibly C. Difficile colitis. Central line sepsis seems less likely. In any event this is resolving with appropriate treatment and further supports the supposition that there is no untreated abdominal sepsis  C. Difficile colitis. Under treatment  ESRD on dialysis  Chronic diastolic congestive heart failure    LOS: 3 days    Prynce Jacober M. Derrell Lolling, M.D., [facility] General and Minimally invasive Surgery Breast and Colorectal Surgery Office:   [phone] Pager:   [phone]  08/29/2011  . .prob

## 2011-08-29 NOTE — Progress Notes (Signed)
S:intubated   CO Abd pain  Tolerated HD well yest O:BP 111/47  Pulse 94  Temp(Src) 98.5 F (36.9 C) (Oral)  Resp 19  Ht 5' 6.14" (1.68 m)  Wt 73.2 kg (161 lb 6 oz)  BMI 25.94 kg/m2  SpO2 98%  Intake/Output Summary (Last 24 hours) at 08/29/11 0749 Last data filed at 08/29/11 0600  Gross per 24 hour  Intake  811.9 ml  Output   1400 ml  Net -588.1 ml   Weight change: -0.6 kg (-1 lb 5.2 oz) ZOX:[id number] CVS:RRR Resp:bil crackles JXB:JYNW BS + distention, There is a firmness in mid-abd that was not there yest Ext:no edema NEURO:Follows  commands      . darbepoetin      . darbepoetin (ARANESP) injection - DIALYSIS  100 mcg Intravenous Q Thu-HD  . dextrose      . famotidine (PEPCID) IV  20 mg Intravenous Q12H  . imipenem-cilastatin  250 mg Intravenous Q12H  . metronidazole  500 mg Intravenous Q8H  . vancomycin (VANCOCIN) rectal ENEMA  500 mg Rectal Q6H  . vancomycin  750 mg Intravenous Q M,W,F-HD  . vancomycin  750 mg Intravenous Once in dialysis  . DISCONTD: vancomycin (VANCOCIN) rectal ENEMA  500 mg Rectal Q8H   Dg Chest Port 1 View  08/28/2011  *RADIOLOGY REPORT*  Clinical Data: Intubated patient.  PORTABLE CHEST - 1 VIEW  Comparison: Portable chest 08/27/2011.  Findings: Support tubes and lines are unchanged.  Extensive bilateral airspace disease persists.  Aeration in the right chest has worsened.  Right greater than left pleural effusions noted.  IMPRESSION: Extensive bilateral airspace disease shows worsening on the right.  Original Report Authenticated By: Bernadene Bell. Maricela Curet, M.D.   Dg Abd Portable 2v  08/28/2011  *RADIOLOGY REPORT*  Clinical Data: Abdominal distention.  Question ileus or small bowel obstruction.  PORTABLE ABDOMEN - 2 VIEW  Comparison: CT abdomen pelvis 08/27/2011 and plain film of the abdomen 08/20/2011.  Findings: The abdomen is nearly gasless.  NG tube and right groin catheter are in place.  Calcified cystic lesion in the left upper quadrant as seen  on CT scan again noted.  Multiple calcifications in the spleen are also again seen.  IMPRESSION: The abdomen is nearly gasless making assessment of small bowel difficult.  No definite acute finding.  Original Report Authenticated By: Bernadene Bell. Maricela Curet, M.D.   BMET    Component Value Date/Time   NA 138 08/29/2011 0429   K 3.7 08/29/2011 0429   CL 101 08/29/2011 0429   CO2 27 08/29/2011 0429   GLUCOSE 75 08/29/2011 0429   BUN 13 08/29/2011 0429   CREATININE 3.60* 08/29/2011 0429   CALCIUM 8.7 08/29/2011 0429   GFRNONAA 17* 08/29/2011 0429   GFRAA 19* 08/29/2011 0429   CBC    Component Value Date/Time   WBC 11.2* 08/29/2011 0429   RBC 2.94* 08/29/2011 0429   HGB 8.4* 08/29/2011 0429   HCT 26.9* 08/29/2011 0429   PLT 168 08/29/2011 0429   MCV 91.5 08/29/2011 0429   MCH 28.6 08/29/2011 0429   MCHC 31.2 08/29/2011 0429   RDW 17.4* 08/29/2011 0429   LYMPHSABS 0.6* 08/26/2011 2306   MONOABS 0.4 08/26/2011 2306   EOSABS 0.0 08/26/2011 2306   BASOSABS 0.1 08/26/2011 2306     Assessment: 1. Sepsis Syndrome 2. PNA 3. C Diff colitis 4. Hypotension, improved now off pressors 5. ESRD  Plan: 1.  HD again in AM 2.  Try to extubate 3. Recheck labs  in am    Reagann Dolce T

## 2011-08-29 NOTE — Progress Notes (Signed)
Name: [name] MRN: [id number] DOB: [birth year]    LOS: 3  Referring Provider:  Norlene Campbell EDP Reason for Referral:  Severe Sepsis, Respiratory failure  PULMONARY / CRITICAL CARE MEDICINE  Brief patient description:  61 y/o male with ESRD and severe sepsis likely from aspiration pneumonia and possibly SBO vs. Perforated bowel vs. Recurrent c.diff colitis. Admitted 6/5  Events Since Admission: 6/5 CT abdomen >> Concerning for early closed loop obstruction with compartmentalized small bowel loops. Marked rectal wall thickening  Current Status: Remains intubated, off pressors now. Tolerated HD yesterday 6/6 Tolerating PSV this am   Vital Signs: Temp:  [97.7 F (36.5 C)-98.5 F (36.9 C)] 98.5 F (36.9 C) (06/07 0000) Pulse Rate:  [67-94] 94  (06/07 0600) Resp:  [0-25] 19  (06/07 0600) BP: (106-145)/(43-54) 111/47 mmHg (06/07 0310) SpO2:  [95 %-100 %] 98 % (06/07 0600) Arterial Line BP: (97-167)/(40-79) 141/60 mmHg (06/07 0600) FiO2 (%):  [39.6 %-70 %] 40.4 % (06/07 0600) Weight:  [73.2 kg (161 lb 6 oz)-75.1 kg (165 lb 9.1 oz)] 73.2 kg (161 lb 6 oz) (06/06 2019)  Physical Examination: Gen: elderly male intubated HEENT: ETT, OGT PULM: Scattered rhonchi  CV: Tachy, no mgr, no JVD AB: No bowel sounds, tender diffusely and distended Ext: generalized edema   Derm: sacral skin breakdown Neuro: Awake, nods appropriately. Follows commands     Principal Problem:  *Septic shock Active Problems:  Chronic diastolic CHF (congestive heart failure)  ESRD (end stage renal disease)  C. difficile colitis  CAD (coronary artery disease)  Acute respiratory failure with hypoxia  Nausea and vomiting  Constipation  Hospital-acquired pneumonia  Acute encephalopathy   ASSESSMENT AND PLAN  PULMONARY  Lab 08/28/11 1454 08/28/11 0535 08/27/11 2345 08/27/11 0815 08/27/11 0615 08/27/11 0001  PHART 7.356 7.342* -- -- -- 7.401  PCO2ART 47.9* 50.0* -- -- -- 50.1*  PO2ART 81.0 86.0 -- -- --  284.0*  HCO3 26.9* 27.3* -- -- -- 31.1*  O2SAT 96.0 96.0 76.5 59.0 53.5 --   Ventilator Settings:  Vent Mode:  [-] PRVC FiO2 (%):  [39.6 %-70 %] 40.4 % Set Rate:  [18 bmp] 18 bmp Vt Set:  [390 mL] 390 mL PEEP:  [5 cmH20-8 cmH20] 5 cmH20 Plateau Pressure:  [16 cmH20] 16 cmH20 CXR:  6/7: persistent B infiltrates, L > R ETT:  6/4 >>  A:  Acute respiratory failure due to likely aspiration vs. HAP P:   -full vent support, 6cc/kg -daily WUA, and SBT- hopefully extubate today  -avoid benzos if possible -fentanyl drip  -rx PNA as below  CARDIOVASCULAR  Lab 08/28/11 0540 08/27/11 1600 08/27/11 0930 08/27/11 0250 08/27/11 0008 08/26/11 2305  TROPONINI -- 0.77* 0.71* 0.44* -- --  LATICACIDVEN -- -- -- -- 2.0 --  PROBNP 21860.0* -- 19652.0* -- -- 25900.0*    Lab 08/28/11 0540 08/27/11 0249 08/26/11 2329  PROCALCITON 29.66 12.92 15.17   ECG:  NSR, IVCD, no clear ST wave changes Lines:  6/5 R Fem CVL >> 6/5 R Fem A-line >> L Subclavian tunneled HD cath ?>>>  A: Septic shock- Resolved  P:  -cannot measure CVP d/t CVL in fem.  -see antibiotics below -tele  RENAL  Lab 08/29/11 0429 08/28/11 0540 08/27/11 1600 08/27/11 0301 08/26/11 2315 08/26/11 2306 08/23/11 1100  NA 138 139 139 141 139 -- --  K 3.7 3.6 -- -- -- -- --  CL 101 102 101 102 95* -- --  CO2 27 26 25 26  -- 29 --  BUN 13 29* 26* 24* 25* -- --  CREATININE 3.60* 6.09* 5.51* 5.10* 5.80* -- --  CALCIUM 8.7 8.6 8.3* 8.1* -- 9.6 --  MG -- -- -- -- -- -- --  PHOS -- 5.9* -- -- -- -- 4.8*   Intake/Output      06/06 0701 - 06/07 0700 06/07 0701 - 06/08 0700   I.V. (mL/kg) 461.9 (6.3)    IV Piggyback 350    Total Intake(mL/kg) 811.9 (11.1)    Emesis/NG output 400    Other 1000    Total Output 1400    Net -588.1          Foley:    A:  ESRD, last outpatient HD 6/3; tolerated 6/6 P:   -HD and volume removal per renal  A: hypokalemia-resolved P: - f/u BMP  GASTROINTESTINAL  Lab 08/29/11 0429 08/28/11 0540  08/26/11 2306 08/23/11 1100  AST 31 17 22  --  ALT 8 8 10  --  ALKPHOS 60 50 73 --  BILITOT 0.5 0.5 0.4 --  PROT 5.4* 4.6* 6.1 --  ALBUMIN 1.7* 1.4* 2.1* 1.8*    A:  Nausea, vomiting, constipation,  C.diff-- CT 6/5 shows possible closed loop obstruction- Surgery does not feel there is a needed surgical intervention 6/7 P:   -OG tube -ABX as below for C.Diff -NPO until has GI function (per CCS) -CCS >> no surgical intervention at this time (signed off 6/7)  HEMATOLOGIC  Lab 08/29/11 0429 08/28/11 0540 08/27/11 0301 08/26/11 2315 08/26/11 2306 08/23/11 1100  HGB 8.4* 7.5* 8.1* 10.2* 8.9* --  HCT 26.9* 23.7* 26.4* 30.0* 28.8* --  PLT 168 153 230 -- 280 218  INR -- -- 1.38 -- -- --  APTT -- -- 35 -- -- --   A:  Chronic anemia P:  -monitor CBC  INFECTIOUS  Lab 08/29/11 0429 08/28/11 0540 08/27/11 0301 08/27/11 0249 08/26/11 2329 08/26/11 2306 08/23/11 1100  WBC 11.2* 7.8 5.1 -- -- 13.6* 15.1*  PROCALCITON -- 29.66 -- 12.92 15.17 -- --   Cultures: 6/4 Blood >> 6/4 tracheal culture >> 6/6 repeat C.Diff >>> positive  Antibiotics: 6/5 Imipenem (HAP/abdominal sepsis)>> 6/5 Vanc (HAP) >> 6/5 Rectal Vanc (c.diff) >> 6/5 Flagyl (c.diff) >>  A:  Known c.diff diagnosed 07/2011 admission, plan was for flagyl to finish on 6/8. Remains C. Diff + 6/6 P:   -continue rectal vanco -continue flagyl -would continue for at least 7 more days after d/c IV vanc/imipenem  A: HAP vs. Aspiration pneumonia P: -vanc/imipenem -respiratory culture pending >> no sample obtained as of 6/7  ENDOCRINE  Lab 08/29/11 0440 08/28/11 2352 08/28/11 2046 08/28/11 2014 08/28/11 1602  GLUCAP 75 75 108* 51* 76   A:  Intermittent hypoglycemia   P:   -monitor cbg -Change KVO fluid to D5NS   NEUROLOGIC  A:  Encephalopathy due to sepsis, following commands 6/7 P:   -supportive care -fentanyl gtt on vent -prn versed   BEST PRACTICE / DISPOSITION Level of Care:  ICU Primary Service:   PCCM Consultants:  Renal Code Status:  Full per family discussion 6/4, will continue to address Diet:  NPO DVT Px:  scd GI Px:  pepcid Skin Integrity: sacral pressure wound noted on admission Social / Family:  No family available    Levy Pupa, MD, PhD 08/29/2011, 2:52 PM [facility] [phone] or if no answer [phone]

## 2011-08-29 NOTE — Progress Notes (Signed)
ANTIBIOTIC CONSULT NOTE - INITIAL  Pharmacy Consult for vancomcycin iv, imipenem, flagyl Indication: sepsis - CDiff and HCAP  No Known Allergies  Patient Measurements: Height: 5' 6.14" (168 cm) Weight: 161 lb 6 oz (73.2 kg) IBW/kg (Calculated) : 64.13     Vital Signs: Temp: 98 F (36.7 C) (06/07 0824) Temp src: Oral (06/07 0824) BP: 143/55 mmHg (06/07 0819) Pulse Rate: 88  (06/07 0819) Intake/Output from previous day: 06/06 0701 - 06/07 0700 In: 834.4 [I.V.:484.4; IV Piggyback:350] Out: 1400 [Emesis/NG output:400] Intake/Output from this shift: Total I/O In: 22.5 [I.V.:22.5] Out: -   Labs:  Basename 08/29/11 0429 08/28/11 0540 08/27/11 1600 08/27/11 0301  WBC 11.2* 7.8 -- 5.1  HGB 8.4* 7.5* -- 8.1*  PLT 168 153 -- 230  LABCREA -- -- -- --  CREATININE 3.60* 6.09* 5.51* --   Estimated Creatinine Clearance: 19.3 ml/min (by C-G formula based on Cr of 3.6). No results found for this basename: VANCOTROUGH:2,VANCOPEAK:2,VANCORANDOM:2,GENTTROUGH:2,GENTPEAK:2,GENTRANDOM:2,TOBRATROUGH:2,TOBRAPEAK:2,TOBRARND:2,AMIKACINPEAK:2,AMIKACINTROU:2,AMIKACIN:2, in the last 72 hours   Microbiology: Recent Results (from the past 720 hour(s))  CULTURE, BLOOD (ROUTINE X 2)     Status: Normal   Collection Time   08/13/11  3:15 PM      Component Value Range Status Comment   Specimen Description BLOOD CENTRAL LINE   Final    Special Requests BOTTLES DRAWN AEROBIC AND ANAEROBIC   Final    Culture  Setup Time [id number]   Final    Culture     Final    Value: DIPHTHEROIDS(CORYNEBACTERIUM SPECIES)     Note: Standardized susceptibility testing for this organism is not available.     Note: Gram Stain Report Called to,Read Back By and Verified With: GENIVEVE WROBLEWSKI @1150  08/15/11 BY KRAWS   Report Status 08/16/2011 FINAL   Final   CULTURE, BLOOD (ROUTINE X 2)     Status: Normal   Collection Time   08/13/11  3:50 PM      Component Value Range Status Comment   Specimen Description BLOOD  HAND RIGHT   Final    Special Requests BOTTLES DRAWN AEROBIC ONLY 5CC   Final    Culture  Setup Time [id number]   Final    Culture NO GROWTH 5 DAYS   Final    Report Status 08/20/2011 FINAL   Final   MRSA PCR SCREENING     Status: Normal   Collection Time   08/13/11  6:50 PM      Component Value Range Status Comment   MRSA by PCR NEGATIVE  NEGATIVE  Final   CLOSTRIDIUM DIFFICILE BY PCR     Status: Abnormal   Collection Time   08/13/11 10:46 PM      Component Value Range Status Comment   C difficile by pcr POSITIVE (*) NEGATIVE  Final   BODY FLUID CULTURE     Status: Normal   Collection Time   08/15/11  5:58 PM      Component Value Range Status Comment   Specimen Description PLEURAL FLUID RIGHT   Final    Special Requests NONE   Final    Gram Stain     Final    Value: RARE WBC PRESENT,BOTH PMN AND MONONUCLEAR     NO ORGANISMS SEEN   Culture NO GROWTH 3 DAYS   Final    Report Status 08/19/2011 FINAL   Final   CULTURE, BLOOD (ROUTINE X 2)     Status: Normal (Preliminary result)   Collection Time   08/26/11 11:49 PM  Component Value Range Status Comment   Specimen Description BLOOD LEFT ARM   Final    Special Requests BOTTLES DRAWN AEROBIC ONLY 2CC   Final    Culture  Setup Time [id number]   Final    Culture     Final    Value:        BLOOD CULTURE RECEIVED NO GROWTH TO DATE CULTURE WILL BE HELD FOR 5 DAYS BEFORE ISSUING A FINAL NEGATIVE REPORT   Report Status PENDING   Incomplete   CULTURE, BLOOD (ROUTINE X 2)     Status: Normal (Preliminary result)   Collection Time   08/26/11 11:56 PM      Component Value Range Status Comment   Specimen Description BLOOD LEFT HAND   Final    Special Requests BOTTLES DRAWN AEROBIC ONLY 3CC   Final    Culture  Setup Time [id number]   Final    Culture     Final    Value:        BLOOD CULTURE RECEIVED NO GROWTH TO DATE CULTURE WILL BE HELD FOR 5 DAYS BEFORE ISSUING A FINAL NEGATIVE REPORT   Report Status PENDING   Incomplete   MRSA PCR  SCREENING     Status: Normal   Collection Time   08/27/11  2:55 AM      Component Value Range Status Comment   MRSA by PCR NEGATIVE  NEGATIVE  Final   CLOSTRIDIUM DIFFICILE BY PCR     Status: Abnormal   Collection Time   08/27/11  8:54 PM      Component Value Range Status Comment   C difficile by pcr POSITIVE (*) NEGATIVE  Final    Assessment: 61 yo with PVD and ESRD dc home on 6/1 after severe sepsis due to diff. Doing well until 6/4 when he developed n/v resp distress and confusion.  Tolerated HD session 6/5.  Vancomycin dose given 6/5.  Goal of Therapy:  Vancomycin trough level 15-20 mcg/ml Renal adjust imipenem  Plan: Next HD session will be 6/8 - will give vancomycin 750mg  after HD.  Continue same imipenem and flagyl dose. Will continue to monitor HD tolerance and cultures.  Celedonio Miyamoto, PharmD, BCPS Clinical Pharmacist Pager [phone]  08/29/2011,9:40 AM

## 2011-08-30 ENCOUNTER — Inpatient Hospital Stay (HOSPITAL_COMMUNITY): Payer: Medicare Other

## 2011-08-30 DIAGNOSIS — I5032 Chronic diastolic (congestive) heart failure: Secondary | ICD-10-CM

## 2011-08-30 DIAGNOSIS — J90 Pleural effusion, not elsewhere classified: Secondary | ICD-10-CM

## 2011-08-30 LAB — GLUCOSE, CAPILLARY: Glucose-Capillary: 82 mg/dL (ref 70–99)

## 2011-08-30 LAB — RENAL FUNCTION PANEL
CO2: 25 mEq/L (ref 19–32)
GFR calc Af Amer: 14 mL/min — ABNORMAL LOW (ref >90)
Glucose, Bld: 81 mg/dL (ref 70–99)
Potassium: 3.6 mEq/L (ref 3.5–5.1)
Sodium: 140 mEq/L (ref 135–145)

## 2011-08-30 LAB — CBC
HCT: 26.7 % — ABNORMAL LOW (ref 39.0–52.0)
Hemoglobin: 8.6 g/dL — ABNORMAL LOW (ref 13.0–17.0)
RBC: 2.97 MIL/uL — ABNORMAL LOW (ref 4.22–5.81)
WBC: 11 10*3/uL — ABNORMAL HIGH (ref 4.0–10.5)

## 2011-08-30 MED ORDER — DOXERCALCIFEROL 4 MCG/2ML IV SOLN
INTRAVENOUS | Status: AC
  Administered 2011-08-30: 6 ug via INTRAVENOUS
  Filled 2011-08-30: qty 4

## 2011-08-30 MED ORDER — DEXTROSE-NACL 5-0.45 % IV SOLN
INTRAVENOUS | Status: DC
  Administered 2011-08-30: 05:00:00 via INTRAVENOUS

## 2011-08-30 MED ORDER — AMLODIPINE BESYLATE 10 MG PO TABS
10.0000 mg | ORAL_TABLET | Freq: Every day | ORAL | Status: DC
  Administered 2011-09-01 – 2011-09-16 (×15): 10 mg via ORAL
  Filled 2011-08-30 (×19): qty 1

## 2011-08-30 MED ORDER — RENA-VITE PO TABS
1.0000 | ORAL_TABLET | Freq: Every day | ORAL | Status: DC
  Administered 2011-08-30 – 2011-09-15 (×16): 1 via ORAL
  Filled 2011-08-30 (×18): qty 1

## 2011-08-30 MED ORDER — OXYCODONE HCL 5 MG PO TABS
5.0000 mg | ORAL_TABLET | Freq: Four times a day (QID) | ORAL | Status: DC | PRN
  Administered 2011-08-30 – 2011-09-13 (×23): 5 mg via ORAL
  Filled 2011-08-30 (×20): qty 1

## 2011-08-30 MED ORDER — CHLORHEXIDINE GLUCONATE 0.12 % MT SOLN
15.0000 mL | Freq: Two times a day (BID) | OROMUCOSAL | Status: DC
  Administered 2011-08-31 – 2011-09-16 (×23): 15 mL via OROMUCOSAL
  Filled 2011-08-30 (×38): qty 15

## 2011-08-30 MED ORDER — DOXERCALCIFEROL 4 MCG/2ML IV SOLN
6.0000 ug | INTRAVENOUS | Status: DC
  Administered 2011-08-30: 6 ug via INTRAVENOUS
  Filled 2011-08-30: qty 4

## 2011-08-30 MED ORDER — CINACALCET HCL 30 MG PO TABS
60.0000 mg | ORAL_TABLET | Freq: Every day | ORAL | Status: DC
  Administered 2011-08-31 – 2011-09-16 (×15): 60 mg via ORAL
  Filled 2011-08-30 (×19): qty 2

## 2011-08-30 MED ORDER — METOPROLOL TARTRATE 100 MG PO TABS
100.0000 mg | ORAL_TABLET | Freq: Two times a day (BID) | ORAL | Status: DC
  Administered 2011-08-30 – 2011-09-16 (×30): 100 mg via ORAL
  Filled 2011-08-30 (×36): qty 1

## 2011-08-30 MED ORDER — BIOTENE DRY MOUTH MT LIQD
15.0000 mL | Freq: Two times a day (BID) | OROMUCOSAL | Status: DC
  Administered 2011-08-31 – 2011-09-13 (×17): 15 mL via OROMUCOSAL

## 2011-08-30 MED ORDER — DEXTROSE-NACL 5-0.45 % IV SOLN: INTRAVENOUS | Status: DC

## 2011-08-30 MED ORDER — DEXTROSE-NACL 5-0.45 % IV SOLN
INTRAVENOUS | Status: DC
  Administered 2011-08-31: 04:00:00 via INTRAVENOUS

## 2011-08-30 NOTE — Progress Notes (Signed)
Name: [name] MRN: [id number] DOB: 28-Jul-1950    LOS: 4  Referring Provider:  Norlene Campbell EDP Reason for Referral:  Severe Sepsis, Respiratory failure  PULMONARY / CRITICAL CARE MEDICINE  Brief patient description:  61 y/o male with ESRD and severe sepsis likely from aspiration pneumonia and possibly SBO vs. Perforated bowel vs. Recurrent c.diff colitis. Admitted 6/5  Events Since Admission: 6/5 CT abdomen >> Concerning for early closed loop obstruction with compartmentalized small bowel loops. Marked rectal wall thickening  Current Status: Extubated 6/7.  For HD today.    Vital Signs: Temp:  [98.7 F (37.1 C)-99.5 F (37.5 C)] 98.7 F (37.1 C) (06/08 1200) Pulse Rate:  [86-131] 94  (06/08 0700) Resp:  [0-26] 20  (06/08 0700) SpO2:  [99 %-100 %] 100 % (06/08 0700) Arterial Line BP: (137-169)/(51-70) 163/67 mmHg (06/08 0700) FiO2 (%):  [39.6 %-39.9 %] 39.9 % (06/07 1500) Weight:  [160 lb 0.9 oz (72.6 kg)] 160 lb 0.9 oz (72.6 kg) (06/08 0351)  Physical Examination: Gen: elderly male, NAD  HEENT: awake, alert  PULM: Resps even non labored on [place], scattered rhonchi CV: Tachy, no mgr, no JVD AB: hypoactive bowel sounds, tender diffusely and distended although improved  Ext: generalized edema   Derm: sacral skin breakdown Neuro: Awake, alert, appropriate   Principal Problem:  *Septic shock Active Problems:  Chronic diastolic CHF (congestive heart failure)  ESRD (end stage renal disease)  C. difficile colitis  CAD (coronary artery disease)  Acute respiratory failure with hypoxia  Nausea and vomiting  Constipation  Hospital-acquired pneumonia  Acute encephalopathy   ASSESSMENT AND PLAN  PULMONARY  Lab 08/28/11 1454 08/28/11 0535 08/27/11 2345 08/27/11 0815 08/27/11 0615 08/27/11 0001  PHART 7.356 7.342* -- -- -- 7.401  PCO2ART 47.9* 50.0* -- -- -- 50.1*  PO2ART 81.0 86.0 -- -- -- 284.0*  HCO3 26.9* 27.3* -- -- -- 31.1*  O2SAT 96.0 96.0 76.5 59.0 53.5 --    Ventilator Settings:   CXR:  6/7: persistent B infiltrates, L > R ETT:  6/4 >>6/7  A:  Acute respiratory failure due to likely aspiration vs. HAP - s/p extubation 6/7 P:   -pulmonary hygiene  -avoid benzos if possible -rx PNA as below  CARDIOVASCULAR  Lab 08/28/11 0540 08/27/11 1600 08/27/11 0930 08/27/11 0250 08/27/11 0008 08/26/11 2305  TROPONINI -- 0.77* 0.71* 0.44* -- --  LATICACIDVEN -- -- -- -- 2.0 --  PROBNP 21860.0* -- 19652.0* -- -- 25900.0*    Lab 08/28/11 0540 08/27/11 0249 08/26/11 2329  PROCALCITON 29.66 12.92 15.17   ECG:  NSR, IVCD, no clear ST wave changes Lines:  6/5 R Fem CVL >> 6/5 R Fem A-line >>6/8 L Subclavian tunneled HD cath ?>>>  A: Septic shock- Resolved  P:  -see antibiotics below -tele  RENAL  Lab 08/30/11 0340 08/29/11 0429 08/28/11 0540 08/27/11 1600 08/27/11 0301  NA 140 138 139 139 141  K 3.6 3.7 -- -- --  CL 103 101 102 101 102  CO2 25 27 26 25 26   BUN 18 13 29* 26* 24*  CREATININE 4.67* 3.60* 6.09* 5.51* 5.10*  CALCIUM 8.9 8.7 8.6 8.3* 8.1*  MG -- -- -- -- --  PHOS 3.8 -- 5.9* -- --   Intake/Output      06/07 0701 - 06/08 0700 06/08 0701 - 06/09 0700   I.V. (mL/kg) 692.5 (9.5)    IV Piggyback 500    Total Intake(mL/kg) 1192.5 (16.4)    Emesis/NG output  Other     Total Output     Net +1192.5          Foley:    A:  ESRD, last outpatient HD 6/3; tolerated 6/6 P:   -HD and volume removal per renal - for HD 6/8  A: hypokalemia-resolved P: - f/u BMP  GASTROINTESTINAL  Lab 08/30/11 0340 08/29/11 0429 08/28/11 0540 08/26/11 2306  AST -- 31 17 22   ALT -- 8 8 10   ALKPHOS -- 60 50 73  BILITOT -- 0.5 0.5 0.4  PROT -- 5.4* 4.6* 6.1  ALBUMIN 1.7* 1.7* 1.4* 2.1*    A:  Nausea, vomiting, constipation,  C.diff-- CT 6/5 shows possible closed loop obstruction- Surgery does not feel there is a needed surgical intervention  P:   -OG tube -ABX as below for C.Diff -NPO until has GI function (per CCS) - attempt PO  meds via tube and clamp -CCS >> no surgical intervention at this time (signed off 6/7) -Vanc enemas  HEMATOLOGIC  Lab 08/30/11 0340 08/29/11 0429 08/28/11 0540 08/27/11 0301 08/26/11 2315 08/26/11 2306  HGB 8.6* 8.4* 7.5* 8.1* 10.2* --  HCT 26.7* 26.9* 23.7* 26.4* 30.0* --  PLT 143* 168 153 230 -- 280  INR -- -- -- 1.38 -- --  APTT -- -- -- 35 -- --   A:  Chronic anemia P:  -monitor CBC  INFECTIOUS  Lab 08/30/11 0340 08/29/11 0429 08/28/11 0540 08/27/11 0301 08/27/11 0249 08/26/11 2329 08/26/11 2306  WBC 11.0* 11.2* 7.8 5.1 -- -- 13.6*  PROCALCITON -- -- 29.66 -- 12.92 15.17 --   Cultures: 6/4 Blood >> 6/4 tracheal culture >> 6/6 repeat C.Diff >>> positive  Antibiotics: 6/5 Imipenem (HAP/abdominal sepsis)>> 6/5 Vanc (HAP) >> 6/5 Rectal Vanc (c.diff) >> 6/5 Flagyl (c.diff) >>  A:  Known c.diff diagnosed 07/2011 admission, plan was for flagyl to finish on 6/8. Remains C. Diff + 6/6 P:   -continue rectal vanco -continue flagyl -would continue for at least 7 more days after d/c IV vanc/imipenem  A: HAP vs. Aspiration pneumonia P: -vanc/imipenem -respiratory culture pending >> no sample obtained as of 6/8 -trend pct   ENDOCRINE  Lab 08/30/11 0343 08/30/11 0029 08/29/11 1559 08/29/11 1156 08/29/11 0911  GLUCAP 78 67* 79 90 128*   A:  Intermittent hypoglycemia   P:   -monitor cbg -Cont D5NS until he can tol diet   NEUROLOGIC  A:  Encephalopathy due to sepsis, - improved  P:   -supportive care -pt/ot    BEST PRACTICE / DISPOSITION Level of Care:  ICU Primary Service:  PCCM Consultants:  Renal Code Status:  Full per family discussion 6/4, will continue to address Diet:  NPO DVT Px:  scd GI Px:  pepcid Skin Integrity: sacral pressure wound noted on admission Social / Family:  No family available    Danford Bad, NP 08/30/2011  1:45 PM Pager: [phone]  *Care during the described time interval was provided by me and/or other providers on  the critical care team. I have reviewed this patient's available data, including medical history, events of note, physical examination and test results as part of my evaluation.  Patient seen and examined, agree with above note.  I dictated the care and orders written for this patient under my direction.  Koren Bound, M.D. [phone]

## 2011-08-30 NOTE — Progress Notes (Signed)
Physical Therapy Cancellation Note: order received, chart reviewed, pt with femoral art line and discussed with RN at 0800 who had order to remove line but on return at 1155 line still in due to variation in pressures reported and pt to receive dialysis. Fem art line not currently able to be removed and requested to hold currently. Will attempt next date. Thanks Delaney Meigs, PT [phone]

## 2011-08-30 NOTE — Progress Notes (Signed)
eLink Physician-Brief Progress Note Patient Name: [name] DOB: 1950/10/08 MRN: [id number]  Date of Service  08/30/2011   HPI/Events of Note     eICU Interventions  Resume  oxycodone at 1/2 home dose   Intervention Category Intermediate Interventions: Pain - evaluation and management  Tanea Moga V. 08/30/2011, 9:00 PM

## 2011-08-30 NOTE — Progress Notes (Signed)
S:intubated   CO Abd pain  Tolerated HD well yest O:BP 145/60  Pulse 94  Temp(Src) 99.5 F (37.5 C) (Oral)  Resp 20  Ht 5' 6.14" (1.68 m)  Wt 72.6 kg (160 lb 0.9 oz)  BMI 25.72 kg/m2  SpO2 100%  Intake/Output Summary (Last 24 hours) at 08/30/11 1610 Last data filed at 08/30/11 0700  Gross per 24 hour  Intake   1170 ml  Output      0 ml  Net   1170 ml   Weight change: -2.5 kg (-5 lb 8.2 oz) RUE:[id number] CVS:RRR Resp:bil crackles YNW:GNFA BS + distention, There is a firmness in mid-abd that was not there yest Ext:no edema NEURO:Follows  commands      . darbepoetin (ARANESP) injection - DIALYSIS  100 mcg Intravenous Q Thu-HD  . dextrose      . famotidine (PEPCID) IV  20 mg Intravenous Q24H  . imipenem-cilastatin  250 mg Intravenous Q12H  . metronidazole  500 mg Intravenous Q8H  . vancomycin (VANCOCIN) rectal ENEMA  500 mg Rectal Q6H  . vancomycin  750 mg Intravenous Q T,Th,Sa-HD  . DISCONTD: famotidine (PEPCID) IV  20 mg Intravenous Q12H  . DISCONTD: vancomycin  750 mg Intravenous Q M,W,F-HD   No results found. BMET    Component Value Date/Time   NA 140 08/30/2011 0340   K 3.6 08/30/2011 0340   CL 103 08/30/2011 0340   CO2 25 08/30/2011 0340   GLUCOSE 81 08/30/2011 0340   BUN 18 08/30/2011 0340   CREATININE 4.67* 08/30/2011 0340   CALCIUM 8.9 08/30/2011 0340   GFRNONAA 12* 08/30/2011 0340   GFRAA 14* 08/30/2011 0340   CBC    Component Value Date/Time   WBC 11.0* 08/30/2011 0340   RBC 2.97* 08/30/2011 0340   HGB 8.6* 08/30/2011 0340   HCT 26.7* 08/30/2011 0340   PLT 143* 08/30/2011 0340   MCV 89.9 08/30/2011 0340   MCH 29.0 08/30/2011 0340   MCHC 32.2 08/30/2011 0340   RDW 17.3* 08/30/2011 0340   LYMPHSABS 0.6* 08/26/2011 2306   MONOABS 0.4 08/26/2011 2306   EOSABS 0.0 08/26/2011 2306   BASOSABS 0.1 08/26/2011 2306     Assessment: 1. Sepsis Syndrome 2. PNA 3. C Diff colitis 4.HTN 5. ESRD  Plan: 1.  HD today 2.  Resume hectorol 3. If plan to feed then renal diet 2280 4 Will try to  remove more volume today to help with BP and resume BP meds 5 DC IV fluids   Orhan Mayorga T

## 2011-08-31 DIAGNOSIS — I4891 Unspecified atrial fibrillation: Secondary | ICD-10-CM

## 2011-08-31 LAB — CBC
MCH: 28.3 pg (ref 26.0–34.0)
Platelets: 133 10*3/uL — ABNORMAL LOW (ref 150–400)
RBC: 2.76 MIL/uL — ABNORMAL LOW (ref 4.22–5.81)
WBC: 7.8 10*3/uL (ref 4.0–10.5)

## 2011-08-31 LAB — GLUCOSE, CAPILLARY
Glucose-Capillary: 86 mg/dL (ref 70–99)
Glucose-Capillary: 88 mg/dL (ref 70–99)

## 2011-08-31 LAB — BASIC METABOLIC PANEL
CO2: 27 mEq/L (ref 19–32)
Calcium: 8.5 mg/dL (ref 8.4–10.5)
GFR calc Af Amer: 24 mL/min — ABNORMAL LOW (ref >90)
Sodium: 141 mEq/L (ref 135–145)

## 2011-08-31 LAB — PROCALCITONIN: Procalcitonin: 22.19 ng/mL

## 2011-08-31 MED ORDER — HEPARIN SODIUM (PORCINE) 1000 UNIT/ML DIALYSIS: 20.0000 [IU]/kg | INTRAMUSCULAR | Status: DC | PRN

## 2011-08-31 MED ORDER — VANCOMYCIN 50 MG/ML ORAL SOLUTION
125.0000 mg | Freq: Four times a day (QID) | ORAL | Status: DC
  Administered 2011-08-31 – 2011-09-16 (×51): 125 mg via ORAL
  Filled 2011-08-31 (×68): qty 2.5

## 2011-08-31 NOTE — Progress Notes (Signed)
eLink Physician-Brief Progress Note Patient Name: [name] DOB: 1950/09/27 MRN: [id number]  Date of Service  08/31/2011   HPI/Events of Note     eICU Interventions  Dc redundant sepsis orders Change vanc to po   Intervention Category Intermediate Interventions: Other:  Lilienne Weins V. 08/31/2011, 6:21 PM

## 2011-08-31 NOTE — Evaluation (Signed)
Physical Therapy Evaluation Patient Details Name: [name] MRN: [id number] DOB: 08-Jun-1950 Today's Date: 08/31/2011 Time: 5621-3086 PT Time Calculation (min): 30 min  PT Assessment / Plan / Recommendation Clinical Impression  Patient is a 61 yo male admitted with septic shock, respiratory failure, CHF, and acute encephalopathy. Patient is very weak with decreased mobility.  Painful left foot also impacting mobility.  Patient currently lives alone (per patient).  At this point, would recommend ST-SNF for continued therapy at discharge (patient would need to be at Mod I level and able to go to HD as OP).  Will follow patient for strengthening, mobility.    PT Assessment  Patient needs continued PT services    Follow Up Recommendations  Skilled nursing facility    Barriers to Discharge Decreased caregiver support      lEquipment Recommendations  Defer to next venue    Recommendations for Other Services     Frequency Min 3X/week    Precautions / Restrictions Precautions Precautions: Fall Restrictions Weight Bearing Restrictions: No         Mobility  Bed Mobility Bed Mobility: Supine to Sit;Sitting - Scoot to Edge of Bed;Sit to Supine Supine to Sit: 1: +2 Total assist;HOB flat Supine to Sit: Patient Percentage: 50% Sitting - Scoot to Edge of Bed: 3: Mod assist Sit to Supine: 1: +2 Total assist;HOB flat Sit to Supine: Patient Percentage: 20% Details for Bed Mobility Assistance: Patient requiring assist to move LE's off bed and raise trunk off bed.  Cues for use of extremities to assist with transitions.  Transfers Transfers: Sit to Stand;Stand to Sit Sit to Stand: 1: +2 Total assist;With upper extremity assist;From bed Sit to Stand: Patient Percentage: 60% Stand to Sit: 1: +2 Total assist;To bed Stand to Sit: Patient Percentage: 50% Details for Transfer Assistance: LE's weak - unable to reach fully upright position.  Able to remain in standing with +2 total assist for  15 seconds.  Patient attempted standing x2.    Exercises     PT Diagnosis: Difficulty walking;Generalized weakness;Acute pain  PT Problem List: Decreased strength;Decreased activity tolerance;Decreased balance;Decreased mobility;Decreased cognition;Decreased knowledge of use of DME;Pain PT Treatment Interventions: DME instruction;Gait training;Functional mobility training;Therapeutic activities;Therapeutic exercise;Cognitive remediation;Patient/family education   PT Goals Acute Rehab PT Goals PT Goal Formulation: With patient Time For Goal Achievement: 09/14/11 Potential to Achieve Goals: Good Pt will go Supine/Side to Sit: with supervision PT Goal: Supine/Side to Sit - Progress: Goal set today Pt will go Sit to Supine/Side: with supervision PT Goal: Sit to Supine/Side - Progress: Goal set today Pt will go Sit to Stand: with min assist;with upper extremity assist PT Goal: Sit to Stand - Progress: Goal set today Pt will Transfer Bed to Chair/Chair to Bed: with min assist PT Transfer Goal: Bed to Chair/Chair to Bed - Progress: Goal set today Pt will Ambulate: 16 - 50 feet;with mod assist;with least restrictive assistive device PT Goal: Ambulate - Progress: Goal set today  Visit Information  Last PT Received On: 08/31/11 Assistance Needed: +2    Subjective Data  Subjective: Patient c/o pain in left foot.  States foot has been painful and getting worse over past 3 months. Patient Stated Goal: To return home   Prior Functioning  Home Living Lives With: Alone Available Help at Discharge: Friend(s);Family;Available PRN/intermittently Type of Home: Apartment Home Access: Level entry Home Layout: One level Bathroom Shower/Tub: Engineer, manufacturing systems: Standard Home Adaptive Equipment: Shower chair with back;Bedside commode/3-in-1;Straight cane;Hand-held shower hose Prior Function Level  of Independence: Independent Able to Take Stairs?: Yes Driving: No Vocation: On  disability Communication Communication: No difficulties    Cognition  Overall Cognitive Status: Impaired Area of Impairment: Awareness of errors;Memory Arousal/Alertness: Awake/alert Orientation Level: Appears intact for tasks assessed Behavior During Session: [facility] for tasks performed Memory Deficits: some of patient's answers to questions differ from prior admission.  Also states last admission was in December (actually May) Awareness of Errors: Assistance required to identify errors made    Extremity/Trunk Assessment Right Upper Extremity Assessment RUE ROM/Strength/Tone: Deficits RUE ROM/Strength/Tone Deficits: General weakness - grossly 3+/5 RUE Sensation: WFL - Light Touch Left Upper Extremity Assessment LUE ROM/Strength/Tone: WFL for tasks assessed LUE Sensation: WFL - Light Touch Right Lower Extremity Assessment RLE ROM/Strength/Tone: Deficits RLE ROM/Strength/Tone Deficits: Weakness - grossly 4-/5 Left Lower Extremity Assessment LLE ROM/Strength/Tone: Deficits;Unable to fully assess;Due to pain LLE ROM/Strength/Tone Deficits: Weakness - grossly 3+/5.  DF/PF 2/5.  Painful left foot with darkening of all toes (? decreased bloodflow to toes?)   Balance Balance Balance Assessed: Yes Static Sitting Balance Static Sitting - Balance Support: Bilateral upper extremity supported;Feet supported Static Sitting - Level of Assistance: 4: Min assist Static Sitting - Comment/# of Minutes: Able to maintain midline posture.  Patient with flexed trunk and forward head.  End of Session PT - End of Session Equipment Utilized During Treatment: Gait belt Activity Tolerance: Patient limited by fatigue;Patient limited by pain Patient left: in bed;with call bell/phone within reach;with bed alarm set Nurse Communication: Mobility status (Pain in left foot)   Vena Austria 08/31/2011, 2:57 PM Durenda Hurt. Renaldo Fiddler, [facility] Acute Rehab Services Pager [phone]

## 2011-08-31 NOTE — Progress Notes (Signed)
Name: [name] MRN: [id number] DOB: [birth year]    LOS: 5  Referring Provider:  Norlene Campbell EDP Reason for Referral:  Severe Sepsis, Respiratory failure  PULMONARY / CRITICAL CARE MEDICINE  Brief patient description:  61 y/o male with ESRD and severe sepsis likely from aspiration pneumonia and possibly SBO vs. Perforated bowel vs. Recurrent c.diff colitis. Admitted 6/5  Events Since Admission: 6/5 CT abdomen >> Concerning for early closed loop obstruction with compartmentalized small bowel loops. Marked rectal wall thickening  Current Status: No acute change.  Asking for something to eat.  C/o L foot pain.    Vital Signs: Temp:  [98 F (36.7 C)-99.7 F (37.6 C)] 99.4 F (37.4 C) (06/09 1225) Pulse Rate:  [76-93] 76  (06/09 1100) Resp:  [15-33] 26  (06/09 1100) BP: (94-137)/(19-49) 101/35 mmHg (06/09 1100) SpO2:  [93 %-100 %] 95 % (06/09 1100) Arterial Line BP: (140-155)/(51-62) 148/53 mmHg (06/09 0800) Weight:  [154 lb 1.6 oz (69.9 kg)-160 lb 11.5 oz (72.9 kg)] 154 lb 1.6 oz (69.9 kg) (06/09 0500)  Physical Examination: Gen: elderly male, NAD  HEENT: awake, alert, appropriate  PULM: Resps even non labored on [place], scattered rhonchi, diminished bases  CV: Tachy, no mgr, no JVD AB:+ bowel sounds, mildly tender diffusely and distended although improved  Ext: generalized edema, L foot swollen, tender, warm, toes slightly discolored.  Derm: sacral skin breakdown Neuro: Awake, alert, appropriate   Principal Problem:  *Septic shock Active Problems:  Chronic diastolic CHF (congestive heart failure)  ESRD (end stage renal disease)  C. difficile colitis  CAD (coronary artery disease)  Acute respiratory failure with hypoxia  Nausea and vomiting  Constipation  Hospital-acquired pneumonia  Acute encephalopathy   ASSESSMENT AND PLAN  PULMONARY  Lab 08/28/11 1454 08/28/11 0535 08/27/11 2345 08/27/11 0815 08/27/11 0615 08/27/11 0001  PHART 7.356 7.342* -- -- -- 7.401    PCO2ART 47.9* 50.0* -- -- -- 50.1*  PO2ART 81.0 86.0 -- -- -- 284.0*  HCO3 26.9* 27.3* -- -- -- 31.1*  O2SAT 96.0 96.0 76.5 59.0 53.5 --   Ventilator Settings:   CXR:  6/7: persistent B infiltrates, L > R ETT:  6/4 >>6/7  A:  Acute respiratory failure due to likely aspiration vs. HAP - s/p extubation 6/7 P:   -pulmonary hygiene  -avoid benzos if possible -rx PNA as below  CARDIOVASCULAR  Lab 08/28/11 0540 08/27/11 1600 08/27/11 0930 08/27/11 0250 08/27/11 0008 08/26/11 2305  TROPONINI -- 0.77* 0.71* 0.44* -- --  LATICACIDVEN -- -- -- -- 2.0 --  PROBNP 21860.0* -- 19652.0* -- -- 25900.0*    Lab 08/31/11 0442 08/28/11 0540 08/27/11 0249 08/26/11 2329  PROCALCITON 22.19 29.66 12.92 15.17   ECG:  NSR, IVCD, no clear ST wave changes Lines:  6/5 R Fem CVL >> 6/5 R Fem A-line >>6/8 L Subclavian tunneled HD cath ?>>>  A: Septic shock- Resolved  P:  -see antibiotics below -tele  RENAL  Lab 08/31/11 0442 08/30/11 0340 08/29/11 0429 08/28/11 0540 08/27/11 1600  NA 141 140 138 139 139  K 3.6 3.6 -- -- --  CL 108 103 101 102 101  CO2 27 25 27 26 25   BUN 10 18 13  29* 26*  CREATININE 3.05* 4.67* 3.60* 6.09* 5.51*  CALCIUM 8.5 8.9 8.7 8.6 8.3*  MG -- -- -- -- --  PHOS -- 3.8 -- 5.9* --   Intake/Output      06/08 0701 - 06/09 0700 06/09 0701 - 06/10 0700  P.O. 60    I.V. (mL/kg) 1295 (18.5) 280 (4)   IV Piggyback 450    Total Intake(mL/kg) 1805 (25.8) 280 (4)   Other 3000    Total Output 3000    Net -1195 +280        Stool Occurrence 3 x     Foley:    A:  ESRD, last outpatient HD 6/3; tolerated 6/6 P:   -HD and volume removal per renal -HD  6/8  A: hypokalemia-resolved P: - f/u BMP  GASTROINTESTINAL  Lab 08/30/11 0340 08/29/11 0429 08/28/11 0540 08/26/11 2306  AST -- 31 17 22   ALT -- 8 8 10   ALKPHOS -- 60 50 73  BILITOT -- 0.5 0.5 0.4  PROT -- 5.4* 4.6* 6.1  ALBUMIN 1.7* 1.7* 1.4* 2.1*    A:  Nausea, vomiting, constipation,  C.diff-- CT 6/5 shows  possible closed loop obstruction- Surgery does not feel there is a needed surgical intervention  P:   -ABX as below for C.Diff -CCS >> no surgical intervention at this time (signed off 6/7) -Cont Vanc enemas -6/9 pos bowel sounds, decreased distension, asking for food, will start full liquid diet   HEMATOLOGIC  Lab 08/31/11 0442 08/30/11 0340 08/29/11 0429 08/28/11 0540 08/27/11 0301  HGB 7.8* 8.6* 8.4* 7.5* 8.1*  HCT 24.7* 26.7* 26.9* 23.7* 26.4*  PLT 133* 143* 168 153 230  INR -- -- -- -- 1.38  APTT -- -- -- -- 35   A:  Chronic anemia P:  -monitor CBC  INFECTIOUS  Lab 08/31/11 0442 08/30/11 0340 08/29/11 0429 08/28/11 0540 08/27/11 0301 08/27/11 0249 08/26/11 2329  WBC 7.8 11.0* 11.2* 7.8 5.1 -- --  PROCALCITON 22.19 -- -- 29.66 -- 12.92 15.17   Cultures: 6/4 Blood >> 6/4 tracheal culture >> 6/6 repeat C.Diff >>> positive  Antibiotics: 6/5 Imipenem (HAP/abdominal sepsis)>> 6/5 Vanc (HAP) >> 6/5 Rectal Vanc (c.diff) >> 6/5 Flagyl (c.diff) >>  A:  Known c.diff diagnosed 07/2011 admission, plan was for flagyl to finish on 6/8. Remains C. Diff + 6/6 P:   -continue rectal vanco -continue flagyl -would continue for at least 7 more days after d/c IV vanc/imipenem  A: HAP vs. Aspiration pneumonia P: -vanc/imipenem -respiratory culture pending >> no sample obtained as of 6/8 -trend pct   ENDOCRINE  Lab 08/31/11 1219 08/31/11 0841 08/31/11 0418 08/31/11 0003 08/30/11 1930  GLUCAP 83 86 86 94 82   A:  Intermittent hypoglycemia   P:   -monitor cbg -decrease D5NS and d/c if tol diet   NEUROLOGIC  A:  Encephalopathy due to sepsis, - improved  P:   -supportive care -pt/ot    BEST PRACTICE / DISPOSITION Level of Care:  ICU Primary Service:  PCCM Consultants:  Renal Code Status:  Full per family discussion 6/4, will continue to address Diet:  NPO DVT Px:  scd GI Px:  pepcid Skin Integrity: sacral pressure wound noted on admission Social / Family:  No family  available   Will tx to SDU 6/9 and ask [facility] to assume care 6/10.   Danford Bad, NP 08/31/2011  1:11 PM Pager: [phone]  Will transfer to SDU with [facility] to pick up in AM.  PCCM will sign off, please call back if needed.  Patient seen and examined, agree with above note.  I dictated the care and orders written for this patient under my direction.  Koren Bound, M.D. [phone]

## 2011-08-31 NOTE — Progress Notes (Signed)
S:  Extubated yesterday, 1800 in / 3000 out with HD, bp drops to 90's at lowest. No complaints, feeling better, no diarrhea, cough or SOB.  O:BP 108/21  Pulse 76  Temp(Src) 99.2 F (37.3 C) (Oral)  Resp 26  Ht 5' 6.14" (1.68 m)  Wt 69.9 kg (154 lb 1.6 oz)  BMI 24.77 kg/m2  SpO2 93%  Intake/Output Summary (Last 24 hours) at 08/31/11 1102 Last data filed at 08/31/11 0900  Gross per 24 hour  Intake   1765 ml  Output   3000 ml  Net  -1235 ml   Weight change: 0.3 kg (10.6 oz) ZOX:[id number] CVS:RRR Resp: clear bilat JXB:JYNW BS , soft Ext:no edema NEURO:Follows  commands      . amLODipine  10 mg Oral Daily  . antiseptic oral rinse  15 mL Mouth Rinse q12n4p  . chlorhexidine  15 mL Mouth Rinse BID  . cinacalcet  60 mg Oral Q breakfast  . darbepoetin (ARANESP) injection - DIALYSIS  100 mcg Intravenous Q Thu-HD  . doxercalciferol  6 mcg Intravenous Q T,Th,Sa-HD  . famotidine (PEPCID) IV  20 mg Intravenous Q24H  . imipenem-cilastatin  250 mg Intravenous Q12H  . metoprolol tartrate  100 mg Oral BID  . metronidazole  500 mg Intravenous Q8H  . multivitamin  1 tablet Oral QHS  . vancomycin (VANCOCIN) rectal ENEMA  500 mg Rectal Q6H  . vancomycin  750 mg Intravenous Q T,Th,Sa-HD   Dg Chest Port 1 View  08/30/2011  *RADIOLOGY REPORT*  Clinical Data: Check endotracheal tube placement.  Pneumonia.  PORTABLE CHEST - 1 VIEW  Comparison: Portable chest 08/28/2011.  Findings: The patient has been extubated.  The NG tube was removed as well.  A left IJ dialysis catheter is stable.  There is slight improved aeration of the left lung.  Right-sided airspace disease is not significantly changed.  Bilateral pleural effusions are evident, right greater than left.  Mild cardiomegaly is stable. Pancreatic calcifications are again noted.  IMPRESSION:  1.  Slight improved aeration of the left lung. 2.  Right-sided airspace disease is stable. 3.  Slight increase in bilateral pleural effusions, right  greater than left. 4.  The patient has been extubated.  The NG tube was removed as well. 5.  The appearance remains concerning for bilateral pneumonia.  Original Report Authenticated By: Jamesetta Orleans. MATTERN, M.D.   BMET    Component Value Date/Time   NA 141 08/31/2011 0442   K 3.6 08/31/2011 0442   CL 108 08/31/2011 0442   CO2 27 08/31/2011 0442   GLUCOSE 91 08/31/2011 0442   BUN 10 08/31/2011 0442   CREATININE 3.05* 08/31/2011 0442   CALCIUM 8.5 08/31/2011 0442   GFRNONAA 20* 08/31/2011 0442   GFRAA 24* 08/31/2011 0442   CBC    Component Value Date/Time   WBC 7.8 08/31/2011 0442   RBC 2.76* 08/31/2011 0442   HGB 7.8* 08/31/2011 0442   HCT 24.7* 08/31/2011 0442   PLT 133* 08/31/2011 0442   MCV 89.5 08/31/2011 0442   MCH 28.3 08/31/2011 0442   MCHC 31.6 08/31/2011 0442   RDW 17.1* 08/31/2011 0442   LYMPHSABS 0.6* 08/26/2011 2306   MONOABS 0.4 08/26/2011 2306   EOSABS 0.0 08/26/2011 2306   BASOSABS 0.1 08/26/2011 2306     Assessment: 1. Sepsis Syndrome 2. PNA- improving 3. C Diff colitis 4 .HTN- better 5. ESRD / MWF [name] L IJ cath - HD yesterday, again Monday  Plan: 1.  HD tomorrow  to get back on schedule    Earl Moselle  MD [facility] [phone] pgr    [phone] cell 08/31/2011, 11:08 AM

## 2011-08-31 NOTE — Progress Notes (Signed)
Nursing: Pt c/o worsening pain in L foot 10/10. Toes are black, pedal pulse is negative via doppler, posterior tibial pulse is positive via doppler. Extremity cool. Dr. Vassie Loll notified in [place]. BLE dopplers have been ordered for tomorrow. Will continue to closely monitor. Alva Garnet, Deane Melick Louise

## 2011-09-01 ENCOUNTER — Encounter: Payer: Medicare Other | Admitting: Physician Assistant

## 2011-09-01 ENCOUNTER — Inpatient Hospital Stay (HOSPITAL_COMMUNITY): Payer: Medicare Other

## 2011-09-01 DIAGNOSIS — M79609 Pain in unspecified limb: Secondary | ICD-10-CM

## 2011-09-01 DIAGNOSIS — N186 End stage renal disease: Secondary | ICD-10-CM

## 2011-09-01 DIAGNOSIS — N2581 Secondary hyperparathyroidism of renal origin: Secondary | ICD-10-CM

## 2011-09-01 DIAGNOSIS — A414 Sepsis due to anaerobes: Secondary | ICD-10-CM

## 2011-09-01 LAB — BASIC METABOLIC PANEL
BUN: 18 mg/dL (ref 6–23)
CO2: 23 mEq/L (ref 19–32)
Calcium: 8.5 mg/dL (ref 8.4–10.5)
Creatinine, Ser: 4.3 mg/dL — ABNORMAL HIGH (ref 0.50–1.35)
Glucose, Bld: 80 mg/dL (ref 70–99)

## 2011-09-01 LAB — CBC
HCT: 26 % — ABNORMAL LOW (ref 39.0–52.0)
Hemoglobin: 8 g/dL — ABNORMAL LOW (ref 13.0–17.0)
MCH: 27.8 pg (ref 26.0–34.0)
MCV: 90.3 fL (ref 78.0–100.0)
RBC: 2.88 MIL/uL — ABNORMAL LOW (ref 4.22–5.81)

## 2011-09-01 MED ORDER — DOXERCALCIFEROL 4 MCG/2ML IV SOLN
6.0000 ug | INTRAVENOUS | Status: DC
  Administered 2011-09-01 – 2011-09-15 (×7): 6 ug via INTRAVENOUS
  Filled 2011-09-01 (×7): qty 4

## 2011-09-01 MED ORDER — DOXERCALCIFEROL 4 MCG/2ML IV SOLN
INTRAVENOUS | Status: AC
  Administered 2011-09-01: 6 ug via INTRAVENOUS
  Filled 2011-09-01: qty 4

## 2011-09-01 MED ORDER — OXYCODONE HCL 5 MG PO TABS
ORAL_TABLET | ORAL | Status: AC
  Administered 2011-09-01: 5 mg via ORAL
  Filled 2011-09-01: qty 1

## 2011-09-01 MED ORDER — MORPHINE SULFATE 4 MG/ML IJ SOLN
3.0000 mg | INTRAMUSCULAR | Status: DC | PRN
  Administered 2011-09-01 – 2011-09-13 (×28): 3 mg via INTRAVENOUS
  Filled 2011-09-01 (×25): qty 1

## 2011-09-01 NOTE — Procedures (Signed)
Patient seen on Hemodialysis. QB 300, UF goal 3.1L Treatment adjusted as needed.  Earl Bills MD [facility]. Office # [phone] Pager # [phone] 9:01 AM

## 2011-09-01 NOTE — Progress Notes (Signed)
Pt off unit

## 2011-09-01 NOTE — Progress Notes (Signed)
Subjective: Feeling better; no CP, no SOB, denies abdominal pain, nausea and vomiting. Patient reports some pain on his LLE (especially his toes). Still having diarrhea.  Objective: Vital signs in last 24 hours: Temp:  [97.9 F (36.6 C)-100 F (37.8 C)] 98 F (36.7 C) (06/10 1257) Pulse Rate:  [73-92] 85  (06/10 1257) Resp:  [15-28] 17  (06/10 1257) BP: (67-171)/(23-81) 146/75 mmHg (06/10 1257) SpO2:  [91 %-98 %] 91 % (06/10 1257) Weight:  [69.7 kg (153 lb 10.6 oz)-72.5 kg (159 lb 13.3 oz)] 69.7 kg (153 lb 10.6 oz) (06/10 1215) Weight change: -2.5 kg (-5 lb 8.2 oz) Last BM Date: 08/31/11  Intake/Output from previous day: 06/09 0701 - 06/10 0700 In: 1610 [P.O.:620; I.V.:790; IV Piggyback:200] Out: -  Total I/O In: 60 [P.O.:60] Out: 2500 [Other:2500]   Physical Exam: General: Alert, awake, oriented x3, in no acute distress; having HD HEENT: No bruits, no goiter. Heart: Regular rate and rhythm, without murmurs, rubs, gallops. Lungs: Clear to auscultation bilaterally. Abdomen: Soft, nontender, nondistended, positive bowel sounds. Extremities: No edema; decrease pedal pulses on hie LLE; also with black discoloration on his left toes. Neuro: Grossly intact, nonfocal.   Lab Results: Basic Metabolic Panel:  Basename 09/01/11 0552 08/31/11 0442 08/30/11 0340  NA 139 141 --  K 3.7 3.6 --  CL 105 108 --  CO2 23 27 --  GLUCOSE 80 91 --  BUN 18 10 --  CREATININE 4.30* 3.05* --  CALCIUM 8.5 8.5 --  MG -- -- --  PHOS -- -- 3.8   Liver Function Tests:  Basename 08/30/11 0340  AST --  ALT --  ALKPHOS --  BILITOT --  PROT --  ALBUMIN 1.7*   CBC:  Basename 09/01/11 0552 08/31/11 0442  WBC 7.2 7.8  NEUTROABS -- --  HGB 8.0* 7.8*  HCT 26.0* 24.7*  MCV 90.3 89.5  PLT 129* 133*   CBG:  Basename 09/01/11 0737 09/01/11 0359 09/01/11 0026 08/31/11 2022 08/31/11 1635 08/31/11 1219  GLUCAP 82 78 84 88 102* 83    Misc. Labs:  Recent Results (from the past 240  hour(s))  CULTURE, BLOOD (ROUTINE X 2)     Status: Normal (Preliminary result)   Collection Time   08/26/11 11:49 PM      Component Value Range Status Comment   Specimen Description BLOOD LEFT ARM   Final    Special Requests BOTTLES DRAWN AEROBIC ONLY 2CC   Final    Culture  Setup Time [id number]   Final    Culture     Final    Value:        BLOOD CULTURE RECEIVED NO GROWTH TO DATE CULTURE WILL BE HELD FOR 5 DAYS BEFORE ISSUING A FINAL NEGATIVE REPORT   Report Status PENDING   Incomplete   CULTURE, BLOOD (ROUTINE X 2)     Status: Normal (Preliminary result)   Collection Time   08/26/11 11:56 PM      Component Value Range Status Comment   Specimen Description BLOOD LEFT HAND   Final    Special Requests BOTTLES DRAWN AEROBIC ONLY 3CC   Final    Culture  Setup Time [id number]   Final    Culture     Final    Value:        BLOOD CULTURE RECEIVED NO GROWTH TO DATE CULTURE WILL BE HELD FOR 5 DAYS BEFORE ISSUING A FINAL NEGATIVE REPORT   Report Status PENDING   Incomplete   MRSA PCR  SCREENING     Status: Normal   Collection Time   08/27/11  2:55 AM      Component Value Range Status Comment   MRSA by PCR NEGATIVE  NEGATIVE  Final   CLOSTRIDIUM DIFFICILE BY PCR     Status: Abnormal   Collection Time   08/27/11  8:54 PM      Component Value Range Status Comment   C difficile by pcr POSITIVE (*) NEGATIVE  Final     Studies/Results: No results found.  Medications: Scheduled Meds:   . amLODipine  10 mg Oral Daily  . antiseptic oral rinse  15 mL Mouth Rinse q12n4p  . chlorhexidine  15 mL Mouth Rinse BID  . cinacalcet  60 mg Oral Q breakfast  . darbepoetin (ARANESP) injection - DIALYSIS  100 mcg Intravenous Q Thu-HD  . doxercalciferol  6 mcg Intravenous Q M,W,F-HD  . famotidine (PEPCID) IV  20 mg Intravenous Q24H  . imipenem-cilastatin  250 mg Intravenous Q12H  . metoprolol tartrate  100 mg Oral BID  . metronidazole  500 mg Intravenous Q8H  . multivitamin  1 tablet Oral QHS  .  vancomycin  125 mg Oral Q6H  . DISCONTD: doxercalciferol  6 mcg Intravenous Q T,Th,Sa-HD  . DISCONTD: vancomycin (VANCOCIN) rectal ENEMA  500 mg Rectal Q6H  . DISCONTD: vancomycin  750 mg Intravenous Q T,Th,Sa-HD   Continuous Infusions:   . DISCONTD: dextrose 5 % and 0.45% NaCl 50 mL/hr at 08/31/11 0400  . DISCONTD: vasopressin (PITRESSIN) infusion - *FOR SHOCK* 0.03 Units/min (08/27/11 0645)   PRN Meds:.sodium chloride, heparin, morphine injection, oxyCODONE, DISCONTD: DOBUTamine, DISCONTD: norepinephrine (LEVOPHED) Adult infusion, DISCONTD: sodium chloride, DISCONTD: vasopressin (PITRESSIN) infusion - *FOR SHOCK*  Assessment/Plan: 1-Septic shock: due to C. Diff infection and HCAP. Improving and no longer on septic shock. Will continue IV primaxin for HCAP (final day is 09/03/11) and will continue PO vancomycin and IV flagyl for his C. Diff  2-Chronic diastolic CHF (congestive heart failure): compensated; continue HD for volume.  3-ESRD (end stage renal disease):continue HD per renal service.  4-C. difficile colitis:continue vanc PO and flagyl IV. Still with diarrhea  5-Acute respiratory failure with hypoxia: resolved.   6-Nausea and vomiting: 2/2 #4; continue PRN antiemetics.  7-Acute encephalopathy: due to #4 and #1; now resolved and mentation back to baseline.  8-Left foot pain: with some black discoloration on his toes; concerns for gangrene. Will check ABI; adjust pain meds and depending results ask vascular surgery to evaluate on him.  9-SHPT: Continue sensipar and hecterol    LOS: 6 days   Kacee Sukhu [facility] [phone]  09/01/2011, 1:15 PM

## 2011-09-01 NOTE — Evaluation (Signed)
Occupational Therapy Evaluation Patient Details Name: [name] MRN: [id number] DOB: 1950-09-25 Today's Date: 09/01/2011 Time: 7829-5621 OT Time Calculation (min): 32 min  OT Assessment / Plan / Recommendation Clinical Impression  61 yr old male admitted for sepsis and respiratory failure.  Pt presents with increased dependence for basic selfcare tasks and functional transfers.  Feel pt will benefit from acute OT to help increase strength and overall ADL function in order to return to PLOF.  Feel pt will likely need SNF for follow-up therapy secondary to not having 24 hour supervision at discharge.      OT Assessment  Patient needs continued OT Services    Follow Up Recommendations  Skilled nursing facility    Barriers to Discharge Decreased caregiver support    Equipment Recommendations  None recommended by OT       Frequency  Min 2X/week    Precautions / Restrictions Precautions Precautions: Fall Restrictions Weight Bearing Restrictions: No   Pertinent Vitals/Pain Pain 4/10 in the right foot with weightbearing.  Meds brought by nursing and pt provided with walker to help with keeping weight off of the LE for transfers and mobility.    ADL  Eating/Feeding: Simulated;Independent Where Assessed - Eating/Feeding: Edge of bed Grooming: Simulated;Set up Where Assessed - Grooming: Unsupported sitting Upper Body Bathing: Simulated;Set up Where Assessed - Upper Body Bathing: Unsupported sitting Lower Body Bathing: Simulated;Minimal assistance Where Assessed - Lower Body Bathing: Supported sit to stand Upper Body Dressing: Simulated;Supervision/safety Where Assessed - Upper Body Dressing: Unsupported sitting Lower Body Dressing: Simulated;Maximal assistance Where Assessed - Lower Body Dressing: Sopported sit to stand Toilet Transfer: Simulated;Minimal assistance Toilet Transfer Method: Stand pivot Toilet Transfer Equipment: Bedside commode Toileting - Clothing  Manipulation and Hygiene: Simulated;Moderate assistance Where Assessed - Toileting Clothing Manipulation and Hygiene: Sit to stand from 3-in-1 or toilet Tub/Shower Transfer Method: Not assessed Transfers/Ambulation Related to ADLs: Pt utilized a RW for ambulating to the window from his bed with min assist and mod demonstrational cueing for correct usage to keep some weight off of his left foot.   ADL Comments: Pt with decreased ability to doon either sock secondary to decreased flexibilty and increased foot pain.    OT Diagnosis: Generalized weakness;Acute pain  OT Problem List: Decreased strength;Decreased activity tolerance;Impaired balance (sitting and/or standing);Decreased knowledge of use of DME or AE;Pain OT Treatment Interventions: Self-care/ADL training;Therapeutic activities;DME and/or AE instruction;Balance training;Patient/family education   OT Goals Acute Rehab OT Goals OT Goal Formulation: With patient ADL Goals Pt Will Perform Grooming: with supervision;Standing at sink ADL Goal: Grooming - Progress: Goal set today Pt Will Perform Lower Body Dressing: with supervision;Sit to stand from bed ADL Goal: Lower Body Dressing - Progress: Goal set today Pt Will Transfer to Toilet: with supervision;with DME;3-in-1 ADL Goal: Toilet Transfer - Progress: Goal set today Additional ADL Goal #1: Pt will transition from supine to sitting with supervision in preparation for selfcare tasks. ADL Goal: Additional Goal #1 - Progress: Goal set today  Visit Information  Last OT Received On: 09/01/11    Subjective Data  Subjective: "It j;ust burns if i try to put my foot down" Patient Stated Goal: Get his foot to stop hurting.   Prior Functioning  Home Living Lives With: Alone Available Help at Discharge: Friend(s);Family;Available PRN/intermittently Type of Home: Apartment Home Access: Level entry Home Layout: One level Bathroom Shower/Tub: Engineer, manufacturing systems:  Standard Home Adaptive Equipment: Shower chair with back;Bedside commode/3-in-1;Straight cane;Hand-held shower hose Prior Function Level of Independence: Independent  Able to Take Stairs?: Yes Driving: No Vocation: On disability Communication Communication: No difficulties Dominant Hand: Right    Cognition  Overall Cognitive Status: Appears within functional limits for tasks assessed/performed Arousal/Alertness: Awake/alert Orientation Level: Appears intact for tasks assessed Behavior During Session: [facility] for tasks performed Cognition - Other Comments: Cognition to be further assessed in context of function.  Able to answer questions appropriately and follow multi step commands.    Extremity/Trunk Assessment Right Upper Extremity Assessment RUE ROM/Strength/Tone: Within functional levels RUE Sensation: WFL - Light Touch RUE Coordination: WFL - gross/fine motor Left Upper Extremity Assessment LUE ROM/Strength/Tone: Within functional levels LUE Sensation: WFL - Light Touch LUE Coordination: WFL - gross/fine motor Trunk Assessment Trunk Assessment: Normal   Mobility Bed Mobility Bed Mobility: Rolling Right;Right Sidelying to Sit Rolling Right: 4: Min assist Right Sidelying to Sit: 4: Min assist;HOB flat;With rails Transfers Transfers: Sit to Stand Sit to Stand: 4: Min assist;Without upper extremity assist;From bed Stand to Sit: 4: Min assist;To bed;With upper extremity assist      Balance Static Standing Balance Static Standing - Balance Support: Right upper extremity supported;Left upper extremity supported Static Standing - Level of Assistance: 4: Min assist  End of Session OT - End of Session Activity Tolerance: Patient limited by pain Patient left: in bed;with call bell/phone within reach   [facility] 09/01/2011, 3:23 PM

## 2011-09-01 NOTE — Progress Notes (Signed)
Received from  2115  Alert oriented x3  At present  Time   Per  Report   Pt has  Intermittent  Confusion  At  Bedtime   So  Placed  In  Camera  Room.  Left  Foot  Cool to  Touch toes black   Elink  Aware  Non functioning grafts  bil  Arms        Right fem tlc  [id number]  .  Stage 2  Wound to coccyx  With mepilex  Cover.

## 2011-09-01 NOTE — Progress Notes (Signed)
PT Cancellation Note  Treatment cancelled today due to patient's refusal to participate. Pt had HD this morning and requested PT return tomorrow.  [name] Bo[place], [place] [phone]  09/01/2011, 1:43 PM

## 2011-09-01 NOTE — Progress Notes (Signed)
Patient ID: [name], male   DOB: 07-09-1950, 61 y.o.   MRN: [id number]  [facility] Progress Note    Subjective:   Reports to be feeling overall better but still has frequent watery diarrhea. Not hypotensive/tachycardic overnight.   Objective:   BP 171/77  Pulse 82  Temp(Src) 99 F (37.2 C) (Oral)  Resp 17  Ht 5' 6.14" (1.68 m)  Wt 70.4 kg (155 lb 3.3 oz)  BMI 24.94 kg/m2  SpO2 91%  Physical Exam: UXL:[id number] resting on HD GUY:QIHKV RRR, normal S1 and S2  Resp:CTA bilaterally, no rales/rhonchi QQV:ZDGL, flat, NT, BS normal Ext:No LE edema  Labs: BMET  Lab 09/01/11 0552 08/31/11 0442 08/30/11 0340 08/29/11 0429 08/28/11 0540 08/27/11 1600 08/27/11 0301  NA 139 141 140 138 139 139 141  K 3.7 3.6 3.6 3.7 3.6 4.3 3.0*  CL 105 108 103 101 102 101 102  CO2 23 27 25 27 26 25 26   GLUCOSE 80 91 81 75 83 112* 111*  BUN 18 10 18 13  29* 26* 24*  CREATININE 4.30* 3.05* 4.67* 3.60* 6.09* 5.51* 5.10*  ALB -- -- -- -- -- -- --  CALCIUM 8.5 8.5 8.9 8.7 8.6 8.3* 8.1*  PHOS -- -- 3.8 -- 5.9* -- --   CBC  Lab 09/01/11 0552 08/31/11 0442 08/30/11 0340 08/29/11 0429 08/26/11 2306  WBC 7.2 7.8 11.0* 11.2* --  NEUTROABS -- -- -- -- 12.5*  HGB 8.0* 7.8* 8.6* 8.4* --  HCT 26.0* 24.7* 26.7* 26.9* --  MCV 90.3 89.5 89.9 91.5 --  PLT 129* 133* 143* 168 --     Medications:      . amLODipine  10 mg Oral Daily  . antiseptic oral rinse  15 mL Mouth Rinse q12n4p  . chlorhexidine  15 mL Mouth Rinse BID  . cinacalcet  60 mg Oral Q breakfast  . darbepoetin (ARANESP) injection - DIALYSIS  100 mcg Intravenous Q Thu-HD  . doxercalciferol  6 mcg Intravenous Q T,Th,Sa-HD  . famotidine (PEPCID) IV  20 mg Intravenous Q24H  . imipenem-cilastatin  250 mg Intravenous Q12H  . metoprolol tartrate  100 mg Oral BID  . metronidazole  500 mg Intravenous Q8H  . multivitamin  1 tablet Oral QHS  . vancomycin  125 mg Oral Q6H  . DISCONTD: vancomycin (VANCOCIN) rectal ENEMA  500  mg Rectal Q6H  . DISCONTD: vancomycin  750 mg Intravenous Q T,Th,Sa-HD     Assessment/ Plan:   1. Sepsis Syndrome with C diff colitis/PNA: Off pressors/extubated and stepped down to regular floor yesterday. Tolerating HD without problems at this time via Left IJ tunneled HD catheter. 2. PNA: improving clinically on Primaxin 3. C Diff colitis: no clear improvement as yet on PO vanco/IV flagyl 4. ESRD: continue MWF HD here with daily evaluations for acute HD needs 5. SHPT: On sensipar and hectorol    Zetta Bills, MD 09/01/2011, 9:02 AM

## 2011-09-01 NOTE — Progress Notes (Signed)
ANTIBIOTIC CONSULT NOTE - FOLLOW UP  Pharmacy Consult for Primaxin and Flagyl Indication: PNA; Cdiff  No Known Allergies  Patient Measurements: Height: 5' 6.14" (168 cm) Weight: 159 lb 13.3 oz (72.5 kg) IBW/kg (Calculated) : 64.13  Adjusted Body Weight:   Vital Signs: Temp: 99 F (37.2 C) (06/10 0815) Temp src: Oral (06/10 0815) BP: 125/67 mmHg (06/10 0930) Pulse Rate: 78  (06/10 0930) Intake/Output from previous day: 06/09 0701 - 06/10 0700 In: 1610 [P.O.:620; I.V.:790; IV Piggyback:200] Out: -  Intake/Output from this shift: Total I/O In: 60 [P.O.:60] Out: -   Labs:  Basename 09/01/11 0552 08/31/11 0442 08/30/11 0340  WBC 7.2 7.8 11.0*  HGB 8.0* 7.8* 8.6*  PLT 129* 133* 143*  LABCREA -- -- --  CREATININE 4.30* 3.05* 4.67*   Estimated Creatinine Clearance: 16.1 ml/min (by C-G formula based on Cr of 4.3). No results found for this basename: VANCOTROUGH:2,VANCOPEAK:2,VANCORANDOM:2,GENTTROUGH:2,GENTPEAK:2,GENTRANDOM:2,TOBRATROUGH:2,TOBRAPEAK:2,TOBRARND:2,AMIKACINPEAK:2,AMIKACINTROU:2,AMIKACIN:2, in the last 72 hours   Microbiology: Recent Results (from the past 720 hour(s))  CULTURE, BLOOD (ROUTINE X 2)     Status: Normal   Collection Time   08/13/11  3:15 PM      Component Value Range Status Comment   Specimen Description BLOOD CENTRAL LINE   Final    Special Requests BOTTLES DRAWN AEROBIC AND ANAEROBIC   Final    Culture  Setup Time [id number]   Final    Culture     Final    Value: DIPHTHEROIDS(CORYNEBACTERIUM SPECIES)     Note: Standardized susceptibility testing for this organism is not available.     Note: Gram Stain Report Called to,Read Back By and Verified With: GENIVEVE WROBLEWSKI @1150  08/15/11 BY KRAWS   Report Status 08/16/2011 FINAL   Final   CULTURE, BLOOD (ROUTINE X 2)     Status: Normal   Collection Time   08/13/11  3:50 PM      Component Value Range Status Comment   Specimen Description BLOOD HAND RIGHT   Final    Special Requests BOTTLES  DRAWN AEROBIC ONLY 5CC   Final    Culture  Setup Time [id number]   Final    Culture NO GROWTH 5 DAYS   Final    Report Status 08/20/2011 FINAL   Final   MRSA PCR SCREENING     Status: Normal   Collection Time   08/13/11  6:50 PM      Component Value Range Status Comment   MRSA by PCR NEGATIVE  NEGATIVE  Final   CLOSTRIDIUM DIFFICILE BY PCR     Status: Abnormal   Collection Time   08/13/11 10:46 PM      Component Value Range Status Comment   C difficile by pcr POSITIVE (*) NEGATIVE  Final   BODY FLUID CULTURE     Status: Normal   Collection Time   08/15/11  5:58 PM      Component Value Range Status Comment   Specimen Description PLEURAL FLUID RIGHT   Final    Special Requests NONE   Final    Gram Stain     Final    Value: RARE WBC PRESENT,BOTH PMN AND MONONUCLEAR     NO ORGANISMS SEEN   Culture NO GROWTH 3 DAYS   Final    Report Status 08/19/2011 FINAL   Final   CULTURE, BLOOD (ROUTINE X 2)     Status: Normal (Preliminary result)   Collection Time   08/26/11 11:49 PM      Component Value Range Status  Comment   Specimen Description BLOOD LEFT ARM   Final    Special Requests BOTTLES DRAWN AEROBIC ONLY 2CC   Final    Culture  Setup Time [id number]   Final    Culture     Final    Value:        BLOOD CULTURE RECEIVED NO GROWTH TO DATE CULTURE WILL BE HELD FOR 5 DAYS BEFORE ISSUING A FINAL NEGATIVE REPORT   Report Status PENDING   Incomplete   CULTURE, BLOOD (ROUTINE X 2)     Status: Normal (Preliminary result)   Collection Time   08/26/11 11:56 PM      Component Value Range Status Comment   Specimen Description BLOOD LEFT HAND   Final    Special Requests BOTTLES DRAWN AEROBIC ONLY 3CC   Final    Culture  Setup Time [id number]   Final    Culture     Final    Value:        BLOOD CULTURE RECEIVED NO GROWTH TO DATE CULTURE WILL BE HELD FOR 5 DAYS BEFORE ISSUING A FINAL NEGATIVE REPORT   Report Status PENDING   Incomplete   MRSA PCR SCREENING     Status: Normal   Collection Time    08/27/11  2:55 AM      Component Value Range Status Comment   MRSA by PCR NEGATIVE  NEGATIVE  Final   CLOSTRIDIUM DIFFICILE BY PCR     Status: Abnormal   Collection Time   08/27/11  8:54 PM      Component Value Range Status Comment   C difficile by pcr POSITIVE (*) NEGATIVE  Final     Anti-infectives     Start     Dose/Rate Route Frequency Ordered Stop   08/31/11 1830   vancomycin (VANCOCIN) 50 mg/mL oral solution 125 mg        125 mg Oral 4 times per day 08/31/11 1821     08/30/11 1200   vancomycin (VANCOCIN) 750 mg in sodium chloride 0.9 % 150 mL IVPB  Status:  Discontinued        750 mg 150 mL/hr over 60 Minutes Intravenous Every T-Th-Sa (Hemodialysis) 08/29/11 0939 08/31/11 1357   08/29/11 1200   vancomycin (VANCOCIN) 750 mg in sodium chloride 0.9 % 150 mL IVPB  Status:  Discontinued        750 mg 150 mL/hr over 60 Minutes Intravenous Every M-W-F (Hemodialysis) 08/27/11 1157 08/29/11 0939   08/28/11 1530   vancomycin (VANCOCIN) 750 mg in sodium chloride 0.9 % 150 mL IVPB        750 mg 150 mL/hr over 60 Minutes Intravenous Once in dialysis 08/28/11 1301 08/28/11 2014   08/28/11 1200   vancomycin (VANCOCIN) 500 mg in sodium chloride irrigation 0.9 % 100 mL ENEMA  Status:  Discontinued     Comments: Retain for 1 hour      500 mg Rectal 4 times per day 08/28/11 1136 08/31/11 1821   08/27/11 2200   imipenem-cilastatin (PRIMAXIN) 250 mg in sodium chloride 0.9 % 100 mL IVPB        250 mg 200 mL/hr over 30 Minutes Intravenous Every 12 hours 08/27/11 0255 09/03/11 2359   08/27/11 1500   vancomycin (VANCOCIN) 500 mg in sodium chloride irrigation 0.9 % 100 mL ENEMA  Status:  Discontinued     Comments: Retain for 1 hour      500 mg Rectal Every 8 hours 08/27/11 1404 08/28/11 1136  08/27/11 1200   metroNIDAZOLE (FLAGYL) IVPB 500 mg        500 mg 100 mL/hr over 60 Minutes Intravenous Every 8 hours 08/27/11 0255     08/27/11 1200   vancomycin (VANCOCIN) 750 mg in sodium chloride 0.9 %  150 mL IVPB  Status:  Discontinued        750 mg 150 mL/hr over 60 Minutes Intravenous Every M-W-F (Hemodialysis) 08/27/11 0255 08/27/11 1157   08/27/11 0600   vancomycin (VANCOCIN) 50 mg/mL oral solution 250 mg  Status:  Discontinued        250 mg Oral 4 times per day 08/27/11 0255 08/27/11 1404   08/27/11 0400   vancomycin (VANCOCIN) 500 mg in sodium chloride 0.9 % 100 mL IVPB        500 mg 100 mL/hr over 60 Minutes Intravenous NOW 08/27/11 0255 08/27/11 0530   08/27/11 0230   imipenem-cilastatin (PRIMAXIN) 500 mg in sodium chloride 0.9 % 100 mL IVPB        500 mg 200 mL/hr over 30 Minutes Intravenous  Once 08/27/11 0220 08/27/11 0350   08/27/11 0230   metroNIDAZOLE (FLAGYL) IVPB 500 mg        500 mg 100 mL/hr over 60 Minutes Intravenous  Once 08/27/11 0220 08/27/11 0343   08/27/11 0230   vancomycin (VANCOCIN) IVPB 1000 mg/200 mL premix        1,000 mg 200 mL/hr over 60 Minutes Intravenous  Once 08/27/11 0220 08/27/11 0348   08/27/11 0129   metroNIDAZOLE (FLAGYL) 5-0.79 MG/ML-% IVPB     Comments: PREYER, ELIZABETH: cabinet override         08/27/11 0129 08/27/11 1329          Assessment: 60yom with ESRD on HD on Primaxin Day 6 for HAP and Flagyl IV and Vancomycin PO Day 6 for Cdiff. Vancomycin IV was discontinued on 6/9. Patient's Tmax 100, WBC wnl, blood cultures NGTD. Primaxin and Flagyl are appropriately dosed for HD. Primaxin end date entered as 6/12. Flagyl and Vancomycin PO should be continued at least 1 week past completion of HAP antibiotics.   Plan:  1. Continue Primaxin 250mg  IV q12h - end date 6/12 2. Continue Flagyl 500mg  IV q8h 3. Monitor vitals, diarrhea and adjust as indicated  Cleon Dew [phone] 09/01/2011,9:54 AM

## 2011-09-02 ENCOUNTER — Ambulatory Visit: Payer: Medicare Other | Admitting: Physician Assistant

## 2011-09-02 DIAGNOSIS — N2581 Secondary hyperparathyroidism of renal origin: Secondary | ICD-10-CM

## 2011-09-02 DIAGNOSIS — N186 End stage renal disease: Secondary | ICD-10-CM

## 2011-09-02 DIAGNOSIS — M79609 Pain in unspecified limb: Secondary | ICD-10-CM

## 2011-09-02 DIAGNOSIS — L98499 Non-pressure chronic ulcer of skin of other sites with unspecified severity: Secondary | ICD-10-CM

## 2011-09-02 DIAGNOSIS — I739 Peripheral vascular disease, unspecified: Secondary | ICD-10-CM

## 2011-09-02 DIAGNOSIS — A414 Sepsis due to anaerobes: Secondary | ICD-10-CM

## 2011-09-02 DIAGNOSIS — J9600 Acute respiratory failure, unspecified whether with hypoxia or hypercapnia: Secondary | ICD-10-CM

## 2011-09-02 LAB — CULTURE, BLOOD (ROUTINE X 2)
Culture  Setup Time: 201306050347
Culture: NO GROWTH

## 2011-09-02 LAB — GLUCOSE, CAPILLARY: Glucose-Capillary: 84 mg/dL (ref 70–99)

## 2011-09-02 MED ORDER — HEPARIN SODIUM (PORCINE) 1000 UNIT/ML DIALYSIS: 40.0000 [IU]/kg | INTRAMUSCULAR | Status: DC | PRN

## 2011-09-02 MED ORDER — FAMOTIDINE 20 MG PO TABS
20.0000 mg | ORAL_TABLET | Freq: Every day | ORAL | Status: DC
  Administered 2011-09-02 – 2011-09-08 (×7): 20 mg via ORAL
  Filled 2011-09-02 (×8): qty 1

## 2011-09-02 MED ORDER — NEPRO/CARBSTEADY PO LIQD
237.0000 mL | ORAL | Status: DC
  Administered 2011-09-04 – 2011-09-11 (×6): 237 mL via ORAL
  Filled 2011-09-02 (×3): qty 237

## 2011-09-02 NOTE — Progress Notes (Addendum)
[facility] [place]  Patient name: [name] MRN: [id number] DOB: Mar 04, 1951 Sex: male  Chief Complaint   Patient presents with   .  PVD     Follow up   .  Carotid     lab work   .  ESRD     HD Monday-Wednesday-Friday    HISTORY OF PRESENT ILLNESS:  The patient comes back today for followup of his left great toe ulcer. Angiography revealed him to have an occluded left superficial femoral artery an extensive common femoral disease. He was sent for cardiac clearance. He has undergone cardiac angiography and deemed to be acceptable risk for bypass surgery. The patient has had no changes in his symptoms.    He was admitted through the [facility] ED on 08/26/11 with respiratory failure and sepsis. His family states that he had been doing well since his d/c home on 6/1, but developed constipation on 6/3. On 6/4 he developed nausea and vomiting in the evening followed by respiratory distress and confusion. He was intubated in the ED for respiratory failure. He last went to HD on 6/3.  Feeling better; no CP, no SOB, denies abdominal pain, nausea and vomiting. Still complaining of pain on his left foot (especially his toes). No diarrhea in 24 hours.    Septic shock: due to C. Diff infection and HCAP. Improving and no longer on septic shock. Will continue IV primaxin for HCAP (final day is 09/03/11) and will continue PO vancomycin and IV flagyl for his C. Diff for now; once primaxin is finish will continue 10 more days using PO vancomycin.   We are consulted to re-evaluate Left Le extremity and possible surgery due to occluded left superficial femoral artery an extensive common femoral disease.     Past Medical History   Diagnosis  Date   .  Hypertension    .  Hyperlipidemia    .  ESRD (end stage renal disease)      a. MWF dialysis in [place] (followed by Dr. Kristian Covey)   .  Arthritis    .  Claudication    .  Stroke  2005   .  Anemia    .  Chronic diastolic CHF (congestive heart  failure)  07/2010     a. 07/2008 Echo EF 50-55%, mild-mod LVH, Gr 1 DD.   Marland Kitchen  Dyspnea on exertion      a. 07/2011 Myoview: Inf infarct with peri-infarct ischemia, ef 44% (prelim reading)   .  Peripheral vascular occlusive disease      a. 07/2011 Periph Angio: No signif Ao-illiac dzs, LCF stenosis, 100% LSFA w recon above knee pop and 3 vessel runoff, 100% RSFA w/ recon above knee --> pending L Fem to below Knee pop bypass.   Marland Kitchen  History of tobacco abuse     Past Surgical History   Procedure  Date   .  Arteriovenous graft placement    .  Av fistula placement  12/10/2000     Right brachiocephalic arteriovenous   .  Hernia repair    .  Aortagram  07/29/2011     Abdominal Aortagram   .  Cardiac catheterization  08/01/11     Left heart catheterization    History    Social History   .  Marital Status:  Married     Spouse Name:  N/A     Number of Children:  N/A   .  Years of Education:  N/A  Occupational History   .  Not on file.    Social History Main Topics   .  Smoking status:  Former Smoker -- 1.0 packs/day for 40 years     Types:  Cigarettes     Quit date:  03/25/2011   .  Smokeless tobacco:  Not on file   .  Alcohol Use:  No   .  Drug Use:  No   .  Sexually Active:     Other Topics  Concern   .  Not on file    Social History Narrative    Lives in [place]. His cousin helps him out and drives him to appts. He quit smoking earlier this year.    Family History   Problem  Relation  Age of Onset   .  Cancer  Mother    .  Cancer  Father     Allergies as of 08/04/2011   .  (No Known Allergies)    Current Outpatient Prescriptions on File Prior to Visit   Medication  Sig  Dispense  Refill   .  amLODipine (NORVASC) 10 MG tablet  Take 10 mg by mouth daily.     Marland Kitchen  aspirin EC 81 MG tablet  Take 1 tablet (81 mg total) by mouth daily.  90 tablet  3   .  Aspirin-Salicylamide-Caffeine (BC HEADACHE PO)  Take 2 packets by mouth 2 (two) times daily as needed. For pain     .  calcium  acetate (PHOSLO) 667 MG capsule  Take 667 mg by mouth See admin instructions. Take 2 tablets with meals and 1 tablet with snacks     .  cinacalcet (SENSIPAR) 30 MG tablet  Take 30 mg by mouth at bedtime.     .  gabapentin (NEURONTIN) 100 MG capsule  Take 100 mg by mouth 2 (two) times daily.     .  Lanthanum Carbonate (FOSRENOL PO)  Take 1,000-2,000 mg by mouth See admin instructions. Take 2000mg  with meals and 1000 mg with snacks     .  lisinopril (PRINIVIL,ZESTRIL) 30 MG tablet  Take 30 mg by mouth daily.     .  metoprolol succinate (TOPROL-XL) 100 MG 24 hr tablet  Take 100 mg by mouth daily. Take with or immediately following a meal.     .  multivitamin (RENA-VIT) TABS tablet  Take 1 tablet by mouth daily.     Marland Kitchen  omeprazole (PRILOSEC) 20 MG capsule  Take 20 mg by mouth daily.     .  sevelamer (RENVELA) 800 MG tablet  Take 800 mg by mouth 3 (three) times daily with meals.      REVIEW OF SYSTEMS:  No change from prior visit  PHYSICAL EXAMINATION:  Vital signs are BP 142/53  Pulse 74  Temp(Src) 98.6 F (37 C) (Oral)  Resp 16  Ht 5\' 6"  (1.676 m)  Wt 152 lb 6.4 oz (69.128 kg)  BMI 24.60 kg/m2  SpO2 99%  General: The patient appears their stated age.  HEENT: No gross abnormalities  Pulmonary: Non labored breathing  Abdomen: Soft and non-tender  Musculoskeletal: There are no major deformities.  Neurologic: No focal weakness or paresthesias are detected,  Skin: There are no ulcer or rashes noted. The left great toe is -4th digits are black and cool to touch.  He has no sensation in the left foot and no proprioception. Psychiatric: The patient has normal affect.  Cardiovascular: There is a regular rate and rhythm without significant  murmur appreciated. Pedal pulses are not palpable.  Left DP is weak via doppler, no PT present.  Right DP/PT present via doppler.   Diagnostic Studies  Carotid ultrasound: No significant carotid stenosis  Vein mapping: The left great saphenous vein ranges from  0.27 in the upper calf to 0.42 and the groin.  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 Plan:  I believe the patient needs revascularization in order to achieve limb salvage do to the left great toe -4th digit gangrene and ischemia. He is not a percutaneous candidate due to his common femoral disease. Vein mapping today reveals what I believe be an acceptable greater saphenous vein. Dr. Myra Gianotti had recommended a left common femoral to below knee popliteal artery bypass graft with vein. He may require a femoral endarterectomy during a consult on 08-04-2011.  The pending ABI will be discussed and a treatment  plan will be formed.  COLLINS, EMMA MAUREEN  PA-C VVS  Agree with above. The patient has diminished left femoral pulse with dry gangrene of the left first second and third toes. There is currently no erythema or drainage. Based on his previous arteriogram, performed on 07/29/11,  he has an occluded superficial femoral artery on the left and also disease in the common femoral artery. He was scheduled for left femoropopliteal bypass grafting by Dr. Durene Cal. This was canceled for cardiac clearance. She actually was scheduled to see someone in the [place] office today. He was admitted on 08/27/2011 with severe sepsis and respiratory failure his sepsis is secondary to C. Difficile infection. We were notified of his admission.  I have contacted  cardiology and they will determine if he needs further workup or if this has already been completed. I was unable to find any clearance under chart review in EPIC. I do not think that the left foot is the source of his sepsis. I will notify Dr. Myra Gianotti of the patient's admission.  Di Kindle. Edilia Bo, MD, FACS [facility] [phone] 09/02/2011

## 2011-09-02 NOTE — Progress Notes (Signed)
Physical Therapy Treatment Patient Details Name: [name] MRN: [id number] DOB: 08/08/1950 Today's Date: 09/02/2011 Time: 4332-9518 PT Time Calculation (min): 18 min  PT Assessment / Plan / Recommendation Comments on Treatment Session  Pt primarily limited by foot pain.      Follow Up Recommendations  Skilled nursing facility    Barriers to Discharge        Equipment Recommendations  Defer to next venue    Recommendations for Other Services    Frequency Min 2X/week   Plan Discharge plan remains appropriate;Frequency needs to be updated    Precautions / Restrictions Precautions Precautions: Fall   Pertinent Vitals/Pain N/A    Mobility  Bed Mobility Supine to Sit: 4: Min assist;HOB elevated;With rails Sitting - Scoot to Edge of Bed: 4: Min assist Sit to Supine: 4: Min guard Details for Bed Mobility Assistance: Assist to raise trunk off bed.  Pt able to perform sitting scoot toward [facility] with min A and pt able to tolerate some wt bearing on feet.    Exercises General Exercises - Lower Extremity Long Arc Quad: AAROM;Both;10 reps;Seated Hip ABduction/ADduction: 10 reps;Seated;Both (Isometric) Hip Flexion/Marching: AAROM;10 reps;Both;Seated   PT Diagnosis:    PT Problem List:   PT Treatment Interventions:     PT Goals Acute Rehab PT Goals PT Goal: Supine/Side to Sit - Progress: Progressing toward goal PT Goal: Sit to Supine/Side - Progress: Progressing toward goal PT Goal: Sit to Stand - Progress: Not progressing PT Transfer Goal: Bed to Chair/Chair to Bed - Progress: Not progressing PT Goal: Ambulate - Progress: Not progressing  Visit Information  Last PT Received On: 09/02/11 Assistance Needed: +2    Subjective Data  Subjective: Pt c/o pain in both feet.   Cognition  Overall Cognitive Status: Appears within functional limits for tasks assessed/performed Arousal/Alertness: Awake/alert Orientation Level: Appears intact for tasks assessed Behavior During  Session: [facility] for tasks performed    Balance  Static Sitting Balance Static Sitting - Balance Support: No upper extremity supported;Feet supported Static Sitting - Level of Assistance: 7: Independent Dynamic Sitting Balance Dynamic Sitting - Balance Support: Feet supported;No upper extremity supported Dynamic Sitting - Level of Assistance: 5: Stand by assistance  End of Session PT - End of Session Activity Tolerance: Patient limited by pain Patient left: in bed;with call bell/phone within reach    [facility] 09/02/2011, 1:14 PM  [facility] PT [phone]

## 2011-09-02 NOTE — Progress Notes (Signed)
Nutrition Follow-up  Extubated on 6/7. CCS signed off with decisions for no surgical interventions for possible closed loop obstruction. Continues to have frequent diarrhea, c-diff positive.  Pt refusing meals. Advanced to renal diet on 6/9. Does not follow renal diet PTA.  Diet Order:  Renal 60 - 70  Meds: Scheduled Meds:   . amLODipine  10 mg Oral Daily  . antiseptic oral rinse  15 mL Mouth Rinse q12n4p  . chlorhexidine  15 mL Mouth Rinse BID  . cinacalcet  60 mg Oral Q breakfast  . darbepoetin (ARANESP) injection - DIALYSIS  100 mcg Intravenous Q Thu-HD  . doxercalciferol  6 mcg Intravenous Q M,W,F-HD  . famotidine  20 mg Oral QHS  . imipenem-cilastatin  250 mg Intravenous Q12H  . metoprolol tartrate  100 mg Oral BID  . metronidazole  500 mg Intravenous Q8H  . multivitamin  1 tablet Oral QHS  . vancomycin  125 mg Oral Q6H  . DISCONTD: doxercalciferol  6 mcg Intravenous Q T,Th,Sa-HD  . DISCONTD: famotidine (PEPCID) IV  20 mg Intravenous Q24H   Continuous Infusions:  PRN Meds:.sodium chloride, heparin, morphine injection, oxyCODONE  Labs:  CMP     Component Value Date/Time   NA 139 09/01/2011 0552   K 3.7 09/01/2011 0552   CL 105 09/01/2011 0552   CO2 23 09/01/2011 0552   GLUCOSE 80 09/01/2011 0552   BUN 18 09/01/2011 0552   CREATININE 4.30* 09/01/2011 0552   CALCIUM 8.5 09/01/2011 0552   PROT 5.4* 08/29/2011 0429   ALBUMIN 1.7* 08/30/2011 0340   AST 31 08/29/2011 0429   ALT 8 08/29/2011 0429   ALKPHOS 60 08/29/2011 0429   BILITOT 0.5 08/29/2011 0429   GFRNONAA 13* 09/01/2011 0552   GFRAA 16* 09/01/2011 0552   Phosphorus  Date/Time Value Range Status  08/30/2011  3:40 AM 3.8  2.3-4.6 (mg/dL) Final     Intake/Output Summary (Last 24 hours) at 09/02/11 0935 Last data filed at 09/02/11 0842  Gross per 24 hour  Intake 902.67 ml  Output   2500 ml  Net -1597.33 ml   Ht: 5' 6.14" (168 cm)  Weight Status:  69.8 kg, wt down 5.9 kg x 5 days  Body mass index is 24.84 kg/(m^2). Weight  WNL.  Re-estimated needs:  2000 - 2200 kcal, 75 - 90 grams protein,  1.2 liters daily  Nutrition Dx:  Inadequate oral intake r/t food preferences AEB pt report and meal refusals.  Goal:  Intake to meet 90-100% of estimated nutrition needs. Unmet.  Intervention:  Liberalize diet. Add Nepro Shake daily. RD to continue to follow nutrition care plan.  Monitor:  Weights, labs, PO Intake, I/O's  [name] Pager #:  [phone]

## 2011-09-02 NOTE — Progress Notes (Signed)
VASCULAR LAB PRELIMINARY  ARTERIAL  ABI completed:    RIGHT    LEFT    PRESSURE WAVEFORM  PRESSURE WAVEFORM  BRACHIAL 174 Triphasic BRACHIAL 196 Triphasic  DP >300 Dampened Monophasic DP >300 Dampened Monophasic         PT >300 Dampened Monophasic PT >300 Biphasic                  RIGHT LEFT  ABI Not ascertained due to probable calcification Not ascertained due to probable calcification     Isabel Freese D, RVS 09/02/2011, 3:44 PM

## 2011-09-02 NOTE — Progress Notes (Signed)
Patient ID: [name], male   DOB: 01/09/1951, 61 y.o.   MRN: [id number]   [facility] Progress Note    Subjective:   Reports improvement in his diarrhea however also states that she has not been eating due to apprehension of having diarrhea. Continues to have discomfort with his left foot.    Objective:   BP 150/58  Pulse 72  Temp(Src) 98.7 F (37.1 C) (Oral)  Resp 18  Ht 5\' 6"  (1.676 m)  Wt 69.8 kg (153 lb 14.1 oz)  BMI 24.84 kg/m2  SpO2 95%  Physical Exam: Gen: Comfortably resting in bed, watching television CVS: Pulse regular in rate and rhythm, heart sounds S1 and S2 normal Resp: Clear to auscultation bilaterally, no rales/rhonchi Abd: Soft, flat, nontender and bowel sounds are normal Ext: No lower extremity edema. Left foot medial 3 digits with prominent ischemic changes-dusky and cold to touch. Minimally palpable left dorsalis pedis compared to right.  Labs: BMET  Lab 09/01/11 0552 08/31/11 0442 08/30/11 0340 08/29/11 0429 08/28/11 0540 08/27/11 1600 08/27/11 0301  NA 139 141 140 138 139 139 141  K 3.7 3.6 3.6 3.7 3.6 4.3 3.0*  CL 105 108 103 101 102 101 102  CO2 23 27 25 27 26 25 26   GLUCOSE 80 91 81 75 83 112* 111*  BUN 18 10 18 13  29* 26* 24*  CREATININE 4.30* 3.05* 4.67* 3.60* 6.09* 5.51* 5.10*  ALB -- -- -- -- -- -- --  CALCIUM 8.5 8.5 8.9 8.7 8.6 8.3* 8.1*  PHOS -- -- 3.8 -- 5.9* -- --   CBC  Lab 09/01/11 0552 08/31/11 0442 08/30/11 0340 08/29/11 0429 08/26/11 2306  WBC 7.2 7.8 11.0* 11.2* --  NEUTROABS -- -- -- -- 12.5*  HGB 8.0* 7.8* 8.6* 8.4* --  HCT 26.0* 24.7* 26.7* 26.9* --  MCV 90.3 89.5 89.9 91.5 --  PLT 129* 133* 143* 168 --     Medications:      . amLODipine  10 mg Oral Daily  . antiseptic oral rinse  15 mL Mouth Rinse q12n4p  . chlorhexidine  15 mL Mouth Rinse BID  . cinacalcet  60 mg Oral Q breakfast  . darbepoetin (ARANESP) injection - DIALYSIS  100 mcg Intravenous Q Thu-HD  . doxercalciferol  6 mcg Intravenous  Q M,W,F-HD  . famotidine  20 mg Oral QHS  . feeding supplement (NEPRO CARB STEADY)  237 mL Oral Q24H  . imipenem-cilastatin  250 mg Intravenous Q12H  . metoprolol tartrate  100 mg Oral BID  . metronidazole  500 mg Intravenous Q8H  . multivitamin  1 tablet Oral QHS  . vancomycin  125 mg Oral Q6H  . DISCONTD: doxercalciferol  6 mcg Intravenous Q T,Th,Sa-HD  . DISCONTD: famotidine (PEPCID) IV  20 mg Intravenous Q24H     Assessment/ Plan:   1. Sepsis Syndrome with C diff colitis/PNA: Improved on antibiotic therapy with Primaxin, vancomycin and metronidazole. 2.C Diff colitis: no clear improvement as yet on PO vanco/IV flagyl  3. 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

## 2011-09-02 NOTE — Progress Notes (Signed)
Subjective: Feeling better; no CP, no SOB, denies abdominal pain, nausea and vomiting. Still complaining of pain on his left foot (especially his toes). No diarrhea in 24 hours.  Objective: Vital signs in last 24 hours: Temp:  [98.7 F (37.1 C)-99.2 F (37.3 C)] 98.7 F (37.1 C) (06/11 0841) Pulse Rate:  [69-75] 72  (06/11 0841) Resp:  [18-20] 18  (06/11 0841) BP: (150-178)/(58-78) 150/58 mmHg (06/11 0841) SpO2:  [94 %-95 %] 95 % (06/11 0841) Weight:  [69.8 kg (153 lb 14.1 oz)] 69.8 kg (153 lb 14.1 oz) (06/10 2204) Weight change: 2.1 kg (4 lb 10.1 oz) Last BM Date: 09/01/11  Intake/Output from previous day: 06/10 0701 - 06/11 0700 In: 922.7 [P.O.:300; I.V.:422.7; IV Piggyback:200] Out: 2500  Total I/O In: 40 [P.O.:40] Out: -    Physical Exam: General: Alert, awake, oriented x3, in no acute distress; having HD HEENT: No bruits, no goiter. Heart: Regular rate and rhythm, without murmurs, rubs, gallops. Lungs: Clear to auscultation bilaterally. Abdomen: Soft, nontender, nondistended, positive bowel sounds. Extremities: No edema; decrease pedal pulses on his LLE; also with black discoloration on his left toes (first, second and third toe; gangrene appearance).. Neuro: Grossly intact, nonfocal.   Lab Results: Basic Metabolic Panel:  Basename 09/01/11 0552 08/31/11 0442  NA 139 141  K 3.7 3.6  CL 105 108  CO2 23 27  GLUCOSE 80 91  BUN 18 10  CREATININE 4.30* 3.05*  CALCIUM 8.5 8.5  MG -- --  PHOS -- --   CBC:  Basename 09/01/11 0552 08/31/11 0442  WBC 7.2 7.8  NEUTROABS -- --  HGB 8.0* 7.8*  HCT 26.0* 24.7*  MCV 90.3 89.5  PLT 129* 133*   CBG:  Basename 09/01/11 0737 09/01/11 0359 09/01/11 0026 08/31/11 2022 08/31/11 1635 08/31/11 1219  GLUCAP 82 78 84 88 102* 83    Misc. Labs:  Recent Results (from the past 240 hour(s))  CULTURE, BLOOD (ROUTINE X 2)     Status: Normal   Collection Time   08/26/11 11:49 PM      Component Value Range Status Comment   Specimen Description BLOOD LEFT ARM   Final    Special Requests BOTTLES DRAWN AEROBIC ONLY [facility]   Final    Culture  Setup Time [id number]   Final    Culture NO GROWTH 5 DAYS   Final    Report Status 09/02/2011 FINAL   Final   CULTURE, BLOOD (ROUTINE X 2)     Status: Normal   Collection Time   08/26/11 11:56 PM      Component Value Range Status Comment   Specimen Description BLOOD LEFT HAND   Final    Special Requests BOTTLES DRAWN AEROBIC ONLY [facility]   Final    Culture  Setup Time [id number]   Final    Culture NO GROWTH 5 DAYS   Final    Report Status 09/02/2011 FINAL   Final   MRSA PCR SCREENING     Status: Normal   Collection Time   08/27/11  2:55 AM      Component Value Range Status Comment   MRSA by PCR NEGATIVE  NEGATIVE  Final   CLOSTRIDIUM DIFFICILE BY PCR     Status: Abnormal   Collection Time   08/27/11  8:54 PM      Component Value Range Status Comment   C difficile by pcr POSITIVE (*) NEGATIVE  Final     Studies/Results: No results found.  Medications: Scheduled Meds:    .  amLODipine  10 mg Oral Daily  . antiseptic oral rinse  15 mL Mouth Rinse q12n4p  . chlorhexidine  15 mL Mouth Rinse BID  . cinacalcet  60 mg Oral Q breakfast  . darbepoetin (ARANESP) injection - DIALYSIS  100 mcg Intravenous Q Thu-HD  . doxercalciferol  6 mcg Intravenous Q M,W,F-HD  . famotidine  20 mg Oral QHS  . feeding supplement (NEPRO CARB STEADY)  237 mL Oral Q24H  . imipenem-cilastatin  250 mg Intravenous Q12H  . metoprolol tartrate  100 mg Oral BID  . metronidazole  500 mg Intravenous Q8H  . multivitamin  1 tablet Oral QHS  . vancomycin  125 mg Oral Q6H  . DISCONTD: famotidine (PEPCID) IV  20 mg Intravenous Q24H   Continuous Infusions:  PRN Meds:.sodium chloride, heparin, morphine injection, oxyCODONE, DISCONTD: heparin  Assessment/Plan: 1-Septic shock: due to C. Diff infection and HCAP. Improving and no longer on septic shock. Will continue IV primaxin for HCAP (final day is  09/03/11) and will continue PO vancomycin and IV flagyl for his C. Diff for now; once primaxin is finish will continue 10 more days using PO vancomycin.  2-Chronic diastolic CHF (congestive heart failure): compensated; continue HD for volume.  3-ESRD (end stage renal disease):continue HD per renal service.  4-C. difficile colitis:continue vanc PO and flagyl IV. No diarrhea in the last 24hours. Will treat with 10 days of PO vancomycin after primaxin therapy is over (last dose of primaxin on 09/03/11)  5-Acute respiratory failure with hypoxia: resolved. Patient will finish abx's as outlined above.  6-Nausea and vomiting: 2/2 #4; continue PRN antiemetics.  7-Acute encephalopathy: due to #4 and #1; now resolved and mentation back to baseline.  8-Left foot pain: with some black discoloration on his toes; concerns for gangrene. Will follow ABI and follow recommendations from [facility] (Dr. Edilia Bo will see patient today).  9-SHPT: Continue sensipar and hecterol  10-Deconditioning: per PT recommendations; patient will need SNF at discharge. Will ask social worker to help with discharge plan.    LOS: 7 days   Deral Schellenberg [facility] [phone]  09/02/2011, 1:21 PM

## 2011-09-03 ENCOUNTER — Inpatient Hospital Stay (HOSPITAL_COMMUNITY): Payer: Medicare Other

## 2011-09-03 DIAGNOSIS — A414 Sepsis due to anaerobes: Secondary | ICD-10-CM

## 2011-09-03 DIAGNOSIS — N2581 Secondary hyperparathyroidism of renal origin: Secondary | ICD-10-CM

## 2011-09-03 DIAGNOSIS — N186 End stage renal disease: Secondary | ICD-10-CM

## 2011-09-03 DIAGNOSIS — I70269 Atherosclerosis of native arteries of extremities with gangrene, unspecified extremity: Secondary | ICD-10-CM

## 2011-09-03 LAB — RENAL FUNCTION PANEL
Albumin: 1.7 g/dL — ABNORMAL LOW (ref 3.5–5.2)
BUN: 20 mg/dL (ref 6–23)
CO2: 24 mEq/L (ref 19–32)
Chloride: 101 mEq/L (ref 96–112)
Creatinine, Ser: 4.75 mg/dL — ABNORMAL HIGH (ref 0.50–1.35)
GFR calc non Af Amer: 12 mL/min — ABNORMAL LOW (ref >90)
Potassium: 4.2 mEq/L (ref 3.5–5.1)

## 2011-09-03 LAB — CBC
HCT: 28.1 % — ABNORMAL LOW (ref 39.0–52.0)
MCV: 90.1 fL (ref 78.0–100.0)
Platelets: 159 10*3/uL (ref 150–400)
RBC: 3.12 MIL/uL — ABNORMAL LOW (ref 4.22–5.81)
RDW: 17.9 % — ABNORMAL HIGH (ref 11.5–15.5)
WBC: 7.4 10*3/uL (ref 4.0–10.5)

## 2011-09-03 LAB — GLUCOSE, CAPILLARY
Glucose-Capillary: 53 mg/dL — ABNORMAL LOW (ref 70–99)
Glucose-Capillary: 113 mg/dL — ABNORMAL HIGH (ref 70–99)

## 2011-09-03 MED ORDER — DARBEPOETIN ALFA-POLYSORBATE 100 MCG/0.5ML IJ SOLN
INTRAMUSCULAR | Status: AC
  Filled 2011-09-03: qty 0.5

## 2011-09-03 MED ORDER — GLUCOSE 40 % PO GEL
ORAL | Status: AC
  Administered 2011-09-03: 37.5 g
  Filled 2011-09-03: qty 1

## 2011-09-03 MED ORDER — DARBEPOETIN ALFA-POLYSORBATE 100 MCG/0.5ML IJ SOLN
100.0000 ug | INTRAMUSCULAR | Status: DC
  Administered 2011-09-03 – 2011-09-10 (×2): 100 ug via INTRAVENOUS
  Filled 2011-09-03: qty 0.5

## 2011-09-03 MED ORDER — MORPHINE SULFATE 2 MG/ML IJ SOLN
INTRAMUSCULAR | Status: AC
  Administered 2011-09-03: 2 mg via INTRAVENOUS
  Filled 2011-09-03: qty 1

## 2011-09-03 MED ORDER — KETOROLAC TROMETHAMINE 15 MG/ML IJ SOLN
15.0000 mg | Freq: Once | INTRAMUSCULAR | Status: AC
  Administered 2011-09-03: 15 mg via INTRAVENOUS
  Filled 2011-09-03: qty 1

## 2011-09-03 MED ORDER — DOXERCALCIFEROL 4 MCG/2ML IV SOLN
INTRAVENOUS | Status: AC
  Filled 2011-09-03: qty 4

## 2011-09-03 NOTE — Progress Notes (Signed)
Patient ID: CHET GREENLEY, male   DOB: 1950/07/19, 61 y.o.   MRN: [id number]  [facility] Progress Note    Subjective:   Reports to be feeling better with improvement of diarrhea and appetite   Objective:   BP 135/18  Pulse 72  Temp 97.8 F (36.6 C) (Oral)  Resp 22  Ht 5\' 6"  (1.676 m)  Wt 71.9 kg (158 lb 8.2 oz)  BMI 25.58 kg/m2  SpO2 91%  Intake/Output Summary (Last 24 hours) at 09/03/11 1052 Last data filed at 09/03/11 0936  Gross per 24 hour  Intake    480 ml  Output      0 ml  Net    480 ml   Weight change: -2.7 kg (-5 lb 15.2 oz)  Physical Exam: WUJ:[id number] on HD AOZ:HYQMV RRR, normal s1 and S2  Resp:CTA bilaterally, no rales/wheeze HQI:ONGE, flat, minimal LLQ tenderness Ext:No LE edema- ischemic medial 3 digits/forefoot  Imaging: No results found.  Labs: BMET  Lab 09/03/11 1011 09/01/11 0552 08/31/11 0442 08/30/11 0340 08/29/11 0429 08/28/11 0540 08/27/11 1600  NA 137 139 141 140 138 139 139  K 4.2 3.7 3.6 3.6 3.7 3.6 4.3  CL 101 105 108 103 101 102 101  CO2 24 23 27 25 27 26 25   GLUCOSE 51* 80 91 81 75 83 112*  BUN 20 18 10 18 13  29* 26*  CREATININE 4.75* 4.30* 3.05* 4.67* 3.60* 6.09* 5.51*  ALB -- -- -- -- -- -- --  CALCIUM 8.1* 8.5 8.5 8.9 8.7 8.6 8.3*  PHOS 4.1 -- -- 3.8 -- 5.9* --   CBC  Lab 09/03/11 1011 09/01/11 0552 08/31/11 0442 08/30/11 0340  WBC 7.4 7.2 7.8 11.0*  NEUTROABS -- -- -- --  HGB 8.8* 8.0* 7.8* 8.6*  HCT 28.1* 26.0* 24.7* 26.7*  MCV 90.1 90.3 89.5 89.9  PLT 159 129* 133* 143*    Medications:      . amLODipine  10 mg Oral Daily  . antiseptic oral rinse  15 mL Mouth Rinse q12n4p  . chlorhexidine  15 mL Mouth Rinse BID  . cinacalcet  60 mg Oral Q breakfast  . darbepoetin (ARANESP) injection - DIALYSIS  100 mcg Intravenous Q Thu-HD  . doxercalciferol  6 mcg Intravenous Q M,W,F-HD  . famotidine  20 mg Oral QHS  . feeding supplement (NEPRO CARB STEADY)  237 mL Oral Q24H  . imipenem-cilastatin  250 mg  Intravenous Q12H  . metoprolol tartrate  100 mg Oral BID  . metronidazole  500 mg Intravenous Q8H  . multivitamin  1 tablet Oral QHS  . vancomycin  125 mg Oral Q6H     Assessment/ Plan:   1. Sepsis Syndrome with C diff colitis/PNA: Improved on antibiotic therapy with Primaxin, vancomycin and metronidazole.  2.C Diff colitis: no clear improvement as yet on PO vanco/IV flagyl  3. ESRD: continue MWF HD here with daily evaluations for acute HD needs  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Left foot ischemia: await ABIs and further evaluation/management by [facility]- anticipated plans for revascularization vs amputation   Zetta Bills, MD 09/03/2011, 10:52 AM

## 2011-09-03 NOTE — Procedures (Signed)
Patient seen on Hemodialysis. QB400, UF goal 2.8L Treatment adjusted as needed.  Zetta Bills MD [facility]. Office # [phone] Pager # [phone] 10:55 AM

## 2011-09-03 NOTE — Clinical Social Work Placement (Signed)
    Clinical Social Work Department CLINICAL SOCIAL WORK PLACEMENT NOTE 09/03/2011  Patient:  [name], [name]  Account Number:  [id number] Admit date:  08/26/2011  Clinical Social Worker:  Genelle Bal, LCSW  Date/time:  09/03/2011 02:43 AM  Clinical Social Work is seeking post-discharge placement for this patient at the following level of care:   SKILLED NURSING   (*CSW will update this form in Epic as items are completed)   09/02/2011  Patient/family provided with [facility] Department of Clinical Social Work's list of facilities offering this level of care within the geographic area requested by the patient (or if unable, by the patient's family).  09/02/2011  Patient/family informed of their freedom to choose among providers that offer the needed level of care, that participate in Medicare, Medicaid or managed care program needed by the patient, have an available bed and are willing to accept the patient.    Patient/family informed of [facility]' ownership interest in [facility], as well as of the fact that they are under no obligation to receive care at this facility.  PASARR submitted to [employer] on 09/03/2011 PASARR number received from [employer] on   FL2 transmitted to all facilities in geographic area requested by pt/family on 09/03/11  FL2 transmitted to all facilities within larger geographic area on   Patient informed that his/her managed care company has contracts with or will negotiate with  certain facilities, including the following:     Patient/family informed of bed offers received: 09/04/11   Patient chooses bed at  Physician recommends and patient chooses bed at    Patient to be transferred to  on   Patient to be transferred to facility by   The following physician request were entered in Epic:   Additional Comments:

## 2011-09-03 NOTE — Clinical Social Work Psychosocial (Addendum)
    Clinical Social Work Department BRIEF PSYCHOSOCIAL ASSESSMENT 09/03/2011  Patient:  [name], [name]     Account Number:  [id number]     Admit date:  08/26/2011  Clinical Social Worker:  Delmer Islam  Date/Time:  09/03/2011 02:26 AM  Referred by:  Physician  Date Referred:  09/02/2011 Referred for  SNF Placement   Other Referral:   Interview type:  Patient Other interview type:    PSYCHOSOCIAL DATA Living Status:  ALONE Admitted from facility:   Level of care:   Primary support name:   Primary support relationship to patient:   Degree of support available:   Patient reported that he has 3 children (2 sons and 1 dau.) and a granddaughter who are supportive.    CURRENT CONCERNS Current Concerns  Post-Acute Placement   Other Concerns:    SOCIAL WORK ASSESSMENT / PLAN On 09/02/11 CSW talked with patient at the bedside regarding discharge plans and MD/PT recommendation for short-term rehab due to needing assistance with help with strengthening and mobility and also due to decreased caregiver support. Patient acknowledges that he lives alone and although he has a supportive family, there is no one to be with him during the day. CSW explained short-term rehab in a facility and patient voiced understanding and agreed to ST rehab prior to returning home. He lives in [place] and prefers a facility in [place]. Patient reported that he goes to dialysis in [place] and rides a [facility], but will soon change to the dialysis center in [place].   Assessment/plan status:  Psychosocial Support/Ongoing Assessment of Needs Other assessment/ plan:   Information/referral to community resources:   Patient given skilled facility list for [facility] on 09/02/11    PATIENT'S/FAMILY'S RESPONSE TO PLAN OF CARE: Patient was agreeable to talking with CSW regarding discharge planning and is agreeable to short-term rehab prior to returning home as he lives alone. CSW advised patient  that we will follow-up with bed offers so that he can choose the facility he wants to receive rehab.

## 2011-09-03 NOTE — Progress Notes (Signed)
Subjective: Patient states that he has a very poor appetite. He is requesting cheese and crackers, stating that this is the only food for which he has a desire. He denies any abdominal pain or any other complaints at this time. His only concern is with his left lower extremity. Objective: Filed Vitals:   09/03/11 1230 09/03/11 1300 09/03/11 1330 09/03/11 1400  BP: 133/60 126/57 125/57 158/66  Pulse: 73 75 69 70  Temp:    98.2 F (36.8 C)  TempSrc:    Oral  Resp: 16 13 26 14   Height:      Weight:    69.4 kg (153 lb)  SpO2:    96%   Weight change: -2.7 kg (-5 lb 15.2 oz)  Intake/Output Summary (Last 24 hours) at 09/03/11 1607 Last data filed at 09/03/11 1400  Gross per 24 hour  Intake    360 ml  Output   2000 ml  Net  -1640 ml    General: Alert, awake, oriented x3, in no acute distress.  HEENT: [place]/AT PEERL, EOMI Neck: Trachea midline,  no masses, no thyromegal,y no JVD, no carotid bruit OROPHARYNX:  Moist, No exudate/ erythema/lesions.  Heart: Regular rate and rhythm, without murmurs, rubs, gallops, PMI non-displaced, no heaves or thrills on palpation.  Lungs: Clear to auscultation, no wheezing or rhonchi noted. No increased vocal fremitus resonant to percussion  Abdomen: Soft, nontender, nondistended, positive bowel sounds, no masses no hepatosplenomegaly noted..  Neuro: No focal neurological deficits noted  Musculoskeletal: No warm swelling or erythema around joints, no spinal tenderness noted. Extremities: There is a blister noted at the base of the fourth toe on the left lower extremity. The left great toe and remainder digits are cool to touch and are black in appearance. I'm unable to palpate any pulses on the left foot. Right foot is warm to touch appear   Lab Results:  [facility] 09/03/11 1011 09/01/11 0552  NA 137 139  K 4.2 3.7  CL 101 105  CO2 24 23  GLUCOSE 51* 80  BUN 20 18  CREATININE 4.75* 4.30*  CALCIUM 8.1* 8.5  MG -- --  PHOS 4.1 --    Basename  09/03/11 1011  AST --  ALT --  ALKPHOS --  BILITOT --  PROT --  ALBUMIN 1.7*   No results found for this basename: LIPASE:2,AMYLASE:2 in the last 72 hours  Basename 09/03/11 1011 09/01/11 0552  WBC 7.4 7.2  NEUTROABS -- --  HGB 8.8* 8.0*  HCT 28.1* 26.0*  MCV 90.1 90.3  PLT 159 129*   No results found for this basename: CKTOTAL:3,CKMB:3,CKMBINDEX:3,TROPONINI:3 in the last 72 hours No components found with this basename: POCBNP:3 No results found for this basename: DDIMER:2 in the last 72 hours No results found for this basename: HGBA1C:2 in the last 72 hours No results found for this basename: CHOL:2,HDL:2,LDLCALC:2,TRIG:2,CHOLHDL:2,LDLDIRECT:2 in the last 72 hours No results found for this basename: TSH,T4TOTAL,FREET3,T3FREE,THYROIDAB in the last 72 hours No results found for this basename: VITAMINB12:2,FOLATE:2,FERRITIN:2,TIBC:2,IRON:2,RETICCTPCT:2 in the last 72 hours  Micro Results: Recent Results (from the past 240 hour(s))  CULTURE, BLOOD (ROUTINE X 2)     Status: Normal   Collection Time   08/26/11 11:49 PM      Component Value Range Status Comment   Specimen Description BLOOD LEFT ARM   Final    Special Requests BOTTLES DRAWN AEROBIC ONLY [facility]   Final    Culture  Setup Time [id number]   Final    Culture NO  GROWTH 5 DAYS   Final    Report Status 09/02/2011 FINAL   Final   CULTURE, BLOOD (ROUTINE X 2)     Status: Normal   Collection Time   08/26/11 11:56 PM      Component Value Range Status Comment   Specimen Description BLOOD LEFT HAND   Final    Special Requests BOTTLES DRAWN AEROBIC ONLY [facility]   Final    Culture  Setup Time [id number]   Final    Culture NO GROWTH 5 DAYS   Final    Report Status 09/02/2011 FINAL   Final   MRSA PCR SCREENING     Status: Normal   Collection Time   08/27/11  2:55 AM      Component Value Range Status Comment   MRSA by PCR NEGATIVE  NEGATIVE Final   CLOSTRIDIUM DIFFICILE BY PCR     Status: Abnormal   Collection Time   08/27/11  8:54  PM      Component Value Range Status Comment   C difficile by pcr POSITIVE (*) NEGATIVE Final     Studies/Results: Ct Abdomen Pelvis Wo Contrast  08/13/2011  *RADIOLOGY REPORT*  Clinical Data: Abdominal pain.  Unresponsive.  CT ABDOMEN AND PELVIS WITHOUT CONTRAST  Technique:  Multidetector CT imaging of the abdomen and pelvis was performed following the standard protocol without intravenous contrast.  Comparison: None.  Findings: Lack of intravenous and oral contrast severely limits the exam.  Moderate bilateral pleural effusions and bibasilar consolidation. Coronary artery calcifications.  Aortic valvular calcifications.  Gallbladder is decompressed.  Liver is within normal limits.  Small amount of free fluid anterior to the liver.  The stomach is markedly distended.  Small bowel loops are also markedly distended.  Distal small bowel is decompressed.  There is a small amount of enteric contrast in the lower abdomen at the midline likely contained within a bowel loop.  Colon is also relatively decompressed.  Small amount of free fluid is seen layering in the pelvis.  The kidneys have a markedly abnormal appearance bilaterally.  The left kidney is enlarged and markedly deformed with areas of curvilinear calcification and mixed density.  Similar changes in the right kidney but to a lesser degree.  Underlying renal mass cannot be excluded.  Dense vascular calcifications are noted.  No obvious free intraperitoneal gas.  Metallic staples are seen in the anterior abdominal wall to the left of midline in the lower abdomen.  Prominent degenerative changes of the hip joints there is a lytic area in  in the right pelvis, above the right acetabulum.  There is loss of cortex in the overlying bone.  There is also a lytic area in the posterior inferior right acetabulum on image 88.  These lytic areas are greater than one would expect with simple degenerative changes.  Aggressive process cannot be excluded.  Gas within  venous structures near the right groin are likely iatrogenic.  IMPRESSION: Very limited exam.  Bilateral pleural effusions and bibasilar consolidation.  Small bowel obstruction pattern.  The transition point is in the lower abdomen.  There are lytic lesions about the right acetabulum as described. An aggressive process such as metastatic disease or myeloma cannot be excluded.  MRI may be helpful.  Markedly abnormal appearance of the kidneys.  Renal mass cannot be excluded.  Original Report Authenticated By: Donavan Burnet, M.D.   Dg Chest 1 View  08/13/2011  *RADIOLOGY REPORT*  Clinical Data: Pneumonia  CHEST - 1 VIEW  Comparison: 08/13/2011  Findings: Diffuse airspace disease throughout the right lung. Small right pleural effusion.  Minimal linear atelectasis at the left base.  Cardiomegaly.  Airspace disease on the left has improved.  IMPRESSION: Improved left airspace disease.  Stable airspace disease throughout the right lung with a right pleural effusion.  Original Report Authenticated By: Donavan Burnet, M.D.   Dg Abd 1 View  08/20/2011  *RADIOLOGY REPORT*  Clinical Data: Abdominal distention.  ABDOMEN - 1 VIEW  Comparison: Plain film the abdomen 08/16/2011 and CT abdomen and pelvis 08/13/2011.  Findings: There are some scattered gas filled but nondilated loops of small bowel.  Small amount of gas is seen in the ascending colon.  NG tube is no longer visualized.  Calcified cystic lesion in the left upper quadrant as seen on CT noted.  Atherosclerotic vascular disease is also noted.  IMPRESSION: Some increase in gas filled but nondilated loops of small bowel could be due to progressive small bowel obstruction.  Original Report Authenticated By: Bernadene Bell. Maricela Curet, M.D.   Ct Abdomen Pelvis W Contrast  08/27/2011  *RADIOLOGY REPORT*  Clinical Data: Abdominal pain.  Septic.  CT ABDOMEN AND PELVIS WITH CONTRAST  Technique:  Multidetector CT imaging of the abdomen and pelvis was performed following the  standard protocol during bolus administration of intravenous contrast.  Contrast: 40mL OMNIPAQUE IOHEXOL 300 MG/ML  SOLN, OMNIPAQUE IOHEXOL 300 MG/ML  SOLN  Comparison: CT scan 08/13/2011.  Findings: The lung bases demonstrate bilateral pleural effusions and bilateral infiltrates.  The liver is unremarkable and stable.  No focal lesions or intrahepatic biliary dilatation.  Gallstones and gallbladder sludge are suspected.  The pancreas is grossly normal.  The spleen is normal in size.  No focal lesions.  The adrenal glands demonstrate mild nodular enlargement suggesting nodular hyperplasia.  The kidneys demonstrate multiple bilateral rim calcified cysts. The right kidney is small and scarred.  The stomach is distended with contrast.  No obvious abnormality. There is a cluster of small bowel loops in the right mid abdomen which demonstrate mild distention.  Findings suspicious for an early closed loop obstruction or possible compartmentalized small bowel. No pneumatosis or significant wall thickening.  There is enhancement of the mucosa and the vascular pedicle demonstrates enhancement.  The remaining small bowel loops are normal in caliber. The colon is grossly normal.  There is diffuse and fairly marked rectal wall thickening.  The aorta demonstrates advanced atherosclerotic changes.  No focal aneurysm or dissection.  The major branch vessels demonstrate severe atherosclerotic calcifications.  The bladder is grossly normal.  The prostate gland and seminal vesicles are unremarkable.  There is a small amount of free pelvic fluid.  Examination of the bony structures demonstrates stable lytic lesions.  IMPRESSION:  1.  Findings concerning for an early closed loop obstruction with compartmentalized small bowel loops.  Recommend surgical consultation. 2.  Diffuse and marked rectal wall thickening.  This could be infectious or inflammatory.  3.  Severe atherosclerotic changes involving the aorta and branch vessels.  4.  Bilateral infiltrates and effusions. 5.  Severe cystic kidney disease.  Original Report Authenticated By: P. Loralie Champagne, M.D.   Ir Pta Venous Left  08/18/2011  *RADIOLOGY REPORT*  Clinical history:End-stage renal disease and clotted left upper extremity cephalic vein fistula.  PROCEDURE(S): DIALYSIS  FISTULA DECLOT; CEPHALIC VEIN ANGIOPLASTY; ULTRASOUND GUIDANCE FOR VASCULAR ACCESS  Physician: Rachelle Hora. Henn, MD  Medications:Heparin 3000 units, TPA 2 mg  Fluoroscopy time: 22.5 minutes  Contrast: 100  ml Omnipaque 300  Procedure:Informed consent was obtained for a declot procedure. The left upper arm was prepped and draped in a sterile fashion. Maximal barrier sterile technique was utilized including caps, mask, sterile gowns, sterile gloves, sterile drape, hand hygiene and skin antiseptic.  The skin was anesthetized with 1% lidocaine. The fistula was accessed using a 21 gauge needle towards the central venous system with ultrasound guidance.  Ultrasound images were obtained for documentation. Micropuncture catheter was placed. A 6-French vascular sheath was placed.  A Kumpe catheter was advanced in the central veins using a Glidewire.  Central venogram was performed.  The catheter was pulled back as TPA was infused within the cephalic vein thrombus.  Fluoroscopic images were saved for documentation. A Bentson wire was placed centrally and the cephalic vein was treated with the AngioJet thrombectomy device. The cephalic vein was also angioplastied with a 6 mm x 40 mm balloon.  Thrombus was treated again with the AngioJet device and the PTD thrombectomy device.  Using ultrasound guidance, a 21 gauge needle was directed into the vein towards the arterial anastomosis.  Micropuncture dilator set was placed and a 6 [place] vascular sheath was placed. Although there was already flow from the arterial system, a Fogarty balloon was pulled through the vein in order to dislodge thrombus. There was improved flow within  the fistula after using the Fogarty balloon.  The central cephalic vein was angioplastied with 6 mm x 40 mm balloon.  At this point, there was flow throughout the fistula.  However, the flow was only temporary.  The central cephalic vein and mid humeral cephalic vein required angioplasty multiple times in order to keep the cephalic vein patent.   At the end of the procedure, there was a pulse within the cephalic vein but poor outflow.  The vascular sheaths were removed with pursestring sutures.  Findings:The left cephalic vein fistula anastomosis is near the elbow.  The vein was patent near the arterial anastomosis but occluded in the mid and upper arm.  The central cephalic vein was also patent.  A large amount of the thrombus was removed using the AngioJet and mechanical thrombectomy device.  Eventually, there was some flow throughout the fistula after balloon angioplasty of a tight stenosis in the central cephalic vein and recurrent stenosis in the mid humeral cephalic vein. At the end of the procedure, there was still a large amount of thrombus within the aneurysmal segment of the mid humeral cephalic vein.  Partial recanalization of the upper left arm cephalic vein.  Impression:Declot of the left upper extremity fistula.  There is improved flow through the fistula but the outflow is tenuous and high risk to occlude again.  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  Discussed the declot procedure with Dr. Hyman Hopes.   Recommend trying the fistula for dialysis.  If the fistula occludes again, recommend a catheter and surgical consultation for a new access.  Original Report Authenticated By: Richarda Overlie, M.D.   Ir Fluoro Guide Cv Line Left  08/20/2011  *RADIOLOGY REPORT*  Clinical data: End-stage renal disease.  Occluded arm graft.  Needs access for hemodialysis.  TUNNELED HEMODIALYSIS CATHETER PLACEMENT WITH ULTRASOUND AND FLUOROSCOPIC GUIDANCE:  Comparison: None  Technique: The procedure,  risks, benefits, and alternatives were explained to the patient.  Questions regarding the procedure were encouraged and answered.  The patient understands and consents to the procedure. The patient was on adequate   antibiotic coverage, so no additional preprocedural antibiotics were  administered. Ultrasound suggested occlusion of the right IJ vein at the thoracic inlet.    Patency of the left IJ vein was confirmed with ultrasound with image documentation. An appropriate skin site was determined. Region was prepped using maximum barrier technique including cap and mask, sterile gown, sterile gloves, large sterile sheet, and Chlorhexidine   as cutaneous antisepsis. The region was infiltrated locally with 1% lidocaine. Under real-time ultrasound guidance, the left IJ vein was accessed with a 21 gauge micropuncture needle; the needle tip within the vein was confirmed with ultrasound image documentation.   Needle exchanged over the 018 guidewire for transitional dilator, which allowed advancement of a Benson wire into the IVC. Over this, an MPA catheter was advanced. A Hemosplit 23 hemodialysis catheter was tunneled from the right anterior chest wall approach to the left IJ dermatotomy site. The MPA catheter was exchanged over an Amplatz wire for serial vascular dilators which allow placement of a peel-away sheath, through which the catheter was advanced under intermittent fluoroscopy, positioned with its tips in the proximal and midright atrium. Spot chest radiograph confirms good catheter position. No pneumothorax. Catheter was flushed and primed per protocol. Catheter secured externally with O Prolene sutures. The left IJ   dermatotomy site was closed with Dermabond. No immediate complication.  IMPRESSION:  1. Technically successful placement of tunneled left IJ hemodialysis catheter with ultrasound and fluoroscopic guidance. Ready for routine use.  Access management: Remains approachable for percutaneous  intervention as needed.  Original Report Authenticated By: Osa Craver, M.D.   Ir Av Dialysis Graft Declot  08/18/2011  *RADIOLOGY REPORT*  Clinical history:End-stage renal disease and clotted left upper extremity cephalic vein fistula.  PROCEDURE(S): DIALYSIS  FISTULA DECLOT; CEPHALIC VEIN ANGIOPLASTY; ULTRASOUND GUIDANCE FOR VASCULAR ACCESS  Physician: Rachelle Hora. Henn, MD  Medications:Heparin 3000 units, TPA 2 mg  Fluoroscopy time: 22.5 minutes  Contrast: 100 ml Omnipaque 300  Procedure:Informed consent was obtained for a declot procedure. The left upper arm was prepped and draped in a sterile fashion. Maximal barrier sterile technique was utilized including caps, mask, sterile gowns, sterile gloves, sterile drape, hand hygiene and skin antiseptic.  The skin was anesthetized with 1% lidocaine. The fistula was accessed using a 21 gauge needle towards the central venous system with ultrasound guidance.  Ultrasound images were obtained for documentation. Micropuncture catheter was placed. A 6-French vascular sheath was placed.  A Kumpe catheter was advanced in the central veins using a Glidewire.  Central venogram was performed.  The catheter was pulled back as TPA was infused within the cephalic vein thrombus.  Fluoroscopic images were saved for documentation. A Bentson wire was placed centrally and the cephalic vein was treated with the AngioJet thrombectomy device. The cephalic vein was also angioplastied with a 6 mm x 40 mm balloon.  Thrombus was treated again with the AngioJet device and the PTD thrombectomy device.  Using ultrasound guidance, a 21 gauge needle was directed into the vein towards the arterial anastomosis.  Micropuncture dilator set was placed and a 6 [place] vascular sheath was placed. Although there was already flow from the arterial system, a Fogarty balloon was pulled through the vein in order to dislodge thrombus. There was improved flow within the fistula after using the Fogarty  balloon.  The central cephalic vein was angioplastied with 6 mm x 40 mm balloon.  At this point, there was flow throughout the fistula.  However, the flow was only temporary.  The central cephalic vein and mid humeral cephalic  vein required angioplasty multiple times in order to keep the cephalic vein patent.   At the end of the procedure, there was a pulse within the cephalic vein but poor outflow.  The vascular sheaths were removed with pursestring sutures.  Findings:The left cephalic vein fistula anastomosis is near the elbow.  The vein was patent near the arterial anastomosis but occluded in the mid and upper arm.  The central cephalic vein was also patent.  A large amount of the thrombus was removed using the AngioJet and mechanical thrombectomy device.  Eventually, there was some flow throughout the fistula after balloon angioplasty of a tight stenosis in the central cephalic vein and recurrent stenosis in the mid humeral cephalic vein. At the end of the procedure, there was still a large amount of thrombus within the aneurysmal segment of the mid humeral cephalic vein.  Partial recanalization of the upper left arm cephalic vein.  Impression:Declot of the left upper extremity fistula.  There is improved flow through the fistula but the outflow is tenuous and high risk to occlude again.  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  Discussed the declot procedure with Dr. Hyman Hopes.   Recommend trying the fistula for dialysis.  If the fistula occludes again, recommend a catheter and surgical consultation for a new access.  Original Report Authenticated By: Richarda Overlie, M.D.   Ir Angio Av Shunt Addl Access  08/18/2011  *RADIOLOGY REPORT*  Clinical history:End-stage renal disease and clotted left upper extremity cephalic vein fistula.  PROCEDURE(S): DIALYSIS  FISTULA DECLOT; CEPHALIC VEIN ANGIOPLASTY; ULTRASOUND GUIDANCE FOR VASCULAR ACCESS  Physician: Rachelle Hora. Henn, MD  Medications:Heparin  3000 units, TPA 2 mg  Fluoroscopy time: 22.5 minutes  Contrast: 100 ml Omnipaque 300  Procedure:Informed consent was obtained for a declot procedure. The left upper arm was prepped and draped in a sterile fashion. Maximal barrier sterile technique was utilized including caps, mask, sterile gowns, sterile gloves, sterile drape, hand hygiene and skin antiseptic.  The skin was anesthetized with 1% lidocaine. The fistula was accessed using a 21 gauge needle towards the central venous system with ultrasound guidance.  Ultrasound images were obtained for documentation. Micropuncture catheter was placed. A 6-French vascular sheath was placed.  A Kumpe catheter was advanced in the central veins using a Glidewire.  Central venogram was performed.  The catheter was pulled back as TPA was infused within the cephalic vein thrombus.  Fluoroscopic images were saved for documentation. A Bentson wire was placed centrally and the cephalic vein was treated with the AngioJet thrombectomy device. The cephalic vein was also angioplastied with a 6 mm x 40 mm balloon.  Thrombus was treated again with the AngioJet device and the PTD thrombectomy device.  Using ultrasound guidance, a 21 gauge needle was directed into the vein towards the arterial anastomosis.  Micropuncture dilator set was placed and a 6 [place] vascular sheath was placed. Although there was already flow from the arterial system, a Fogarty balloon was pulled through the vein in order to dislodge thrombus. There was improved flow within the fistula after using the Fogarty balloon.  The central cephalic vein was angioplastied with 6 mm x 40 mm balloon.  At this point, there was flow throughout the fistula.  However, the flow was only temporary.  The central cephalic vein and mid humeral cephalic vein required angioplasty multiple times in order to keep the cephalic vein patent.   At the end of the procedure, there was a pulse within the  cephalic vein but poor outflow.  The  vascular sheaths were removed with pursestring sutures.  Findings:The left cephalic vein fistula anastomosis is near the elbow.  The vein was patent near the arterial anastomosis but occluded in the mid and upper arm.  The central cephalic vein was also patent.  A large amount of the thrombus was removed using the AngioJet and mechanical thrombectomy device.  Eventually, there was some flow throughout the fistula after balloon angioplasty of a tight stenosis in the central cephalic vein and recurrent stenosis in the mid humeral cephalic vein. At the end of the procedure, there was still a large amount of thrombus within the aneurysmal segment of the mid humeral cephalic vein.  Partial recanalization of the upper left arm cephalic vein.  Impression:Declot of the left upper extremity fistula.  There is improved flow through the fistula but the outflow is tenuous and high risk to occlude again.  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  Discussed the declot procedure with Dr. Hyman Hopes.   Recommend trying the fistula for dialysis.  If the fistula occludes again, recommend a catheter and surgical consultation for a new access.  Original Report Authenticated By: Richarda Overlie, M.D.   Ir US Guide Vasc Access Left  08/20/2011  *RADIOLOGY REPORT*  Clinical data: End-stage renal disease.  Occluded arm graft.  Needs access for hemodialysis.  TUNNELED HEMODIALYSIS CATHETER PLACEMENT WITH ULTRASOUND AND FLUOROSCOPIC GUIDANCE:  Comparison: None  Technique: The procedure, risks, benefits, and alternatives were explained to the patient.  Questions regarding the procedure were encouraged and answered.  The patient understands and consents to the procedure. The patient was on adequate   antibiotic coverage, so no additional preprocedural antibiotics were administered. Ultrasound suggested occlusion of the right IJ vein at the thoracic inlet.    Patency of the left IJ vein was confirmed with ultrasound with  image documentation. An appropriate skin site was determined. Region was prepped using maximum barrier technique including cap and mask, sterile gown, sterile gloves, large sterile sheet, and Chlorhexidine   as cutaneous antisepsis. The region was infiltrated locally with 1% lidocaine. Under real-time ultrasound guidance, the left IJ vein was accessed with a 21 gauge micropuncture needle; the needle tip within the vein was confirmed with ultrasound image documentation.   Needle exchanged over the 018 guidewire for transitional dilator, which allowed advancement of a Benson wire into the IVC. Over this, an MPA catheter was advanced. A Hemosplit 23 hemodialysis catheter was tunneled from the right anterior chest wall approach to the left IJ dermatotomy site. The MPA catheter was exchanged over an Amplatz wire for serial vascular dilators which allow placement of a peel-away sheath, through which the catheter was advanced under intermittent fluoroscopy, positioned with its tips in the proximal and midright atrium. Spot chest radiograph confirms good catheter position. No pneumothorax. Catheter was flushed and primed per protocol. Catheter secured externally with O Prolene sutures. The left IJ   dermatotomy site was closed with Dermabond. No immediate complication.  IMPRESSION:  1. Technically successful placement of tunneled left IJ hemodialysis catheter with ultrasound and fluoroscopic guidance. Ready for routine use.  Access management: Remains approachable for percutaneous intervention as needed.  Original Report Authenticated By: Osa Craver, M.D.   Ir US Guide Vasc Access Left  08/18/2011  *RADIOLOGY REPORT*  Clinical history:End-stage renal disease and clotted left upper extremity cephalic vein fistula.  PROCEDURE(S): DIALYSIS  FISTULA DECLOT; CEPHALIC VEIN ANGIOPLASTY; ULTRASOUND GUIDANCE FOR VASCULAR ACCESS  Physician: Rachelle Hora. Henn, MD  Medications:Heparin 3000 units, TPA 2 mg  Fluoroscopy time:  22.5 minutes  Contrast: 100 ml Omnipaque 300  Procedure:Informed consent was obtained for a declot procedure. The left upper arm was prepped and draped in a sterile fashion. Maximal barrier sterile technique was utilized including caps, mask, sterile gowns, sterile gloves, sterile drape, hand hygiene and skin antiseptic.  The skin was anesthetized with 1% lidocaine. The fistula was accessed using a 21 gauge needle towards the central venous system with ultrasound guidance.  Ultrasound images were obtained for documentation. Micropuncture catheter was placed. A 6-French vascular sheath was placed.  A Kumpe catheter was advanced in the central veins using a Glidewire.  Central venogram was performed.  The catheter was pulled back as TPA was infused within the cephalic vein thrombus.  Fluoroscopic images were saved for documentation. A Bentson wire was placed centrally and the cephalic vein was treated with the AngioJet thrombectomy device. The cephalic vein was also angioplastied with a 6 mm x 40 mm balloon.  Thrombus was treated again with the AngioJet device and the PTD thrombectomy device.  Using ultrasound guidance, a 21 gauge needle was directed into the vein towards the arterial anastomosis.  Micropuncture dilator set was placed and a 6 [place] vascular sheath was placed. Although there was already flow from the arterial system, a Fogarty balloon was pulled through the vein in order to dislodge thrombus. There was improved flow within the fistula after using the Fogarty balloon.  The central cephalic vein was angioplastied with 6 mm x 40 mm balloon.  At this point, there was flow throughout the fistula.  However, the flow was only temporary.  The central cephalic vein and mid humeral cephalic vein required angioplasty multiple times in order to keep the cephalic vein patent.   At the end of the procedure, there was a pulse within the cephalic vein but poor outflow.  The vascular sheaths were removed with  pursestring sutures.  Findings:The left cephalic vein fistula anastomosis is near the elbow.  The vein was patent near the arterial anastomosis but occluded in the mid and upper arm.  The central cephalic vein was also patent.  A large amount of the thrombus was removed using the AngioJet and mechanical thrombectomy device.  Eventually, there was some flow throughout the fistula after balloon angioplasty of a tight stenosis in the central cephalic vein and recurrent stenosis in the mid humeral cephalic vein. At the end of the procedure, there was still a large amount of thrombus within the aneurysmal segment of the mid humeral cephalic vein.  Partial recanalization of the upper left arm cephalic vein.  Impression:Declot of the left upper extremity fistula.  There is improved flow through the fistula but the outflow is tenuous and high risk to occlude again.  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  Discussed the declot procedure with Dr. Hyman Hopes.   Recommend trying the fistula for dialysis.  If the fistula occludes again, recommend a catheter and surgical consultation for a new access.  Original Report Authenticated By: Richarda Overlie, M.D.   Dg Chest Port 1 View  08/30/2011  *RADIOLOGY REPORT*  Clinical Data: Check endotracheal tube placement.  Pneumonia.  PORTABLE CHEST - 1 VIEW  Comparison: Portable chest 08/28/2011.  Findings: The patient has been extubated.  The NG tube was removed as well.  A left IJ dialysis catheter is stable.  There is slight improved aeration of the left lung.  Right-sided  airspace disease is not significantly changed.  Bilateral pleural effusions are evident, right greater than left.  Mild cardiomegaly is stable. Pancreatic calcifications are again noted.  IMPRESSION:  1.  Slight improved aeration of the left lung. 2.  Right-sided airspace disease is stable. 3.  Slight increase in bilateral pleural effusions, right greater than left. 4.  The patient has been  extubated.  The NG tube was removed as well. 5.  The appearance remains concerning for bilateral pneumonia.  Original Report Authenticated By: Jamesetta Orleans. MATTERN, M.D.   Dg Chest Port 1 View  08/28/2011  *RADIOLOGY REPORT*  Clinical Data: Intubated patient.  PORTABLE CHEST - 1 VIEW  Comparison: Portable chest 08/27/2011.  Findings: Support tubes and lines are unchanged.  Extensive bilateral airspace disease persists.  Aeration in the right chest has worsened.  Right greater than left pleural effusions noted.  IMPRESSION: Extensive bilateral airspace disease shows worsening on the right.  Original Report Authenticated By: Bernadene Bell. D'ALESSIO, M.D.   Dg Chest Portable 1 View  08/27/2011  *RADIOLOGY REPORT*  Clinical Data: Line placement.  PORTABLE CHEST - 1 VIEW  Comparison: 08/26/2011.  Findings: The support apparatus is in good position.  The endotracheal tube is now 4.6 cm above the carina.  Interval progressive airspace consolidation in the left lung could reflect worsening edema or infiltrate.  Small effusions are stable.  IMPRESSION:  1.  Support apparatus in good position without complicating features. 2.  Worsening airspace consolidation, left greater than right could reflect worsening edema or infiltrate. 3.  Stable small effusions.  Original Report Authenticated By: P. Loralie Champagne, M.D.   Dg Chest Port 1 View  08/26/2011  *RADIOLOGY REPORT*  Clinical Data: Endotracheal tube placement.  PORTABLE CHEST - 1 VIEW  Comparison: 08/16/2011.  Findings: The endotracheal tube is right at the right mainstem bronchus and should be retracted 3 cm.  The dialysis catheter is stable.  The NG tube is in the stomach.  There is pulmonary edema, areas of atelectasis and a right pleural effusion.  IMPRESSION: The endotracheal tube is at the right mainstem bronchus and should be retracted 3 cm.  Original Report Authenticated By: P. Loralie Champagne, M.D.   Dg Chest Port 1 View  08/16/2011  *RADIOLOGY REPORT*   Clinical Data: Pneumonia.  Pleural effusion.  PORTABLE CHEST - 1 VIEW  Comparison: 08/15/2011  Findings: Nasogastric tube extends into the stomach.  Cardiomegaly noted with mild interstitial accentuation and increased obscuration of the right hemidiaphragm primarily ascribed to pleural effusion and associated passive atelectasis.  There is mild atelectasis at the left lung base along with a small left pleural effusion.  Lung volumes are slightly worsened compared to yesterday's exam.  Atherosclerotic calcification of the aortic arch noted. Glenohumeral degenerative findings noted.  IMPRESSION:  1.  Increase in apparent size of the right pleural effusion with associated passive atelectasis. 2.  Small left pleural effusion with left basilar atelectasis also noted. 3.  Cardiomegaly with minimal interstitial accentuation.  Original Report Authenticated By: Dellia Cloud, M.D.   Dg Chest Port 1 View  08/15/2011  *RADIOLOGY REPORT*  Clinical Data: Status post right thoracentesis.  PORTABLE CHEST - 1 VIEW  Comparison: Earlier the same day  Findings: 1556 hours.  Right pleural effusion has decreased in the interval.  No evidence for pneumothorax.  There is some subsegmental atelectasis at the right base. The NG tube passes into the stomach although the distal tip position is not included on the film. The cardiopericardial silhouette  is enlarged.  Diffuse interstitial opacity is improved in the interval, suggesting resolving edema.  IMPRESSION: No evidence for pneumothorax after right thoracentesis.  Original Report Authenticated By: ERIC A. MANSELL, M.D.   Dg Chest Port 1 View  08/15/2011  *RADIOLOGY REPORT*  Clinical Data: Shortness of breath and pleural effusion.  PORTABLE CHEST - 1 VIEW  Comparison: 08/14/2011  Findings: The NG tube passes into the stomach although the distal tip position is not included on the film. The cardiopericardial silhouette is enlarged.  Vascular congestion with diffuse interstitial  opacity is consistent with edema.  There is bibasilar atelectasis and right pleural effusion.  IMPRESSION: No substantial change.  Cardiomegaly with edema and right pleural effusion.  Original Report Authenticated By: ERIC A. MANSELL, M.D.   Portable Chest Xray In Am  08/14/2011  *RADIOLOGY REPORT*  Clinical Data: Respiratory failure  PORTABLE CHEST - 1 VIEW  Comparison: Aug 13, 2011  Findings: There is cardiomegaly and pulmonary edema, as well as a moderate sized right pleural effusion.  Overall, the findings  have not significantly changed.  The nasogastric tube tip courses off the inferior film.  IMPRESSION: No significant change in cardiomegaly, pulmonary edema, moderate sized right pleural effusion.  Original Report Authenticated By: Brandon Melnick, M.D.   Dg Chest Port 1 View  08/13/2011  *RADIOLOGY REPORT*  Clinical Data: Unresponsive.  Shortness of breath and abdominal distention.  PORTABLE CHEST - 1 VIEW  Comparison: 07/31/2011.  Findings: Trachea is midline.  Heart size stable.  There is bilateral air space disease, right greater than left, with progression from 07/31/2011.  Associated right pleural effusion. There may be a tiny left pleural effusion.  IMPRESSION: Worsening bilateral air space disease with an associated right pleural effusion.  Question tiny left pleural effusion.  Congestive heart failure is likely.  Original Report Authenticated By: Reyes Ivan, M.D.   Dg Abd Portable 1v  08/16/2011  *RADIOLOGY REPORT*  Clinical Data: Small bowel obstruction  PORTABLE ABDOMEN - 1 VIEW  Comparison: 08/15/2011  Findings: Vascular calcifications and rim calcified left renal lesions noted.  Nasogastric tube extends into the stomach.  The right femoral line projects over the pelvis.  Left pelvic skin clips noted.  Currently most of the bowel is gasless.  The visualized loops of bowel do not demonstrate bowel dilatation.  Lumbar spondylosis is again noted. Pleural effusions noted at the lung  bases, with cardiomegaly.  IMPRESSION:  1.  No dilated bowel is currently visible, although much of the bowel is gasless. 2.  Small right and smaller left pleural effusions. 3.  Calcified left renal cysts or masses. 4.  Vascular calcifications. 5.  Right femoral line tip projects over the right sacral ala.  Original Report Authenticated By: Dellia Cloud, M.D.   Dg Abd Portable 1v  08/15/2011  *RADIOLOGY REPORT*  Clinical Data: Small bowel obstruction.  PORTABLE ABDOMEN - 1 VIEW  Comparison: Plain films and CT abdomen and pelvis 08/13/2011.  Findings: New NG tube and right femoral catheter are in place. Surgical staples to the left of midline again noted.  Gas filled small bowel loops are again seen but appear less numerous and prominent.  Bilateral upper quadrant calcifications are noted and consistent with renal calcifications seen on CT.  IMPRESSION: Mild improvement small bowel obstruction.  Original Report Authenticated By: Bernadene Bell. Maricela Curet, M.D.   Dg Abd Portable 1v  08/13/2011  *RADIOLOGY REPORT*  Clinical Data: Unresponsive.  Shortness of breath and abdominal distention.  PORTABLE ABDOMEN -  1 VIEW  Comparison: None.  Findings: There is mild gaseous distention of small bowel loops. Some colonic gas and stool are seen.  Surgical skin staples overlie the left lower quadrant.  Peripherally calcified structure in the left paraspinal region has the shape of the left kidney.  Additional tubular calcifications are seen in the left upper quadrant.  There may be small calcifications in the right paraspinal region.  IMPRESSION:  1.  Mild small bowel distention with possible wall thickening.  CT abdomen pelvis with contrast would likely be helpful in further evaluation, as clinically indicated. 2.  Calcified structures in the left upper quadrant, and likely right paraspinal region, as discussed above.  These could also be further assessed with CT.  Original Report Authenticated By: Reyes Ivan,  M.D.   Dg Abd Portable 2v  08/28/2011  *RADIOLOGY REPORT*  Clinical Data: Abdominal distention.  Question ileus or small bowel obstruction.  PORTABLE ABDOMEN - 2 VIEW  Comparison: CT abdomen pelvis 08/27/2011 and plain film of the abdomen 08/20/2011.  Findings: The abdomen is nearly gasless.  NG tube and right groin catheter are in place.  Calcified cystic lesion in the left upper quadrant as seen on CT scan again noted.  Multiple calcifications in the spleen are also again seen.  IMPRESSION: The abdomen is nearly gasless making assessment of small bowel difficult.  No definite acute finding.  Original Report Authenticated By: Bernadene Bell. Maricela Curet, M.D.    Medications: I have reviewed the patient's current medications. Scheduled Meds:   . amLODipine  10 mg Oral Daily  . antiseptic oral rinse  15 mL Mouth Rinse q12n4p  . chlorhexidine  15 mL Mouth Rinse BID  . cinacalcet  60 mg Oral Q breakfast  . darbepoetin      . darbepoetin (ARANESP) injection - DIALYSIS  100 mcg Intravenous Q Wed-HD  . doxercalciferol      . doxercalciferol  6 mcg Intravenous Q M,W,F-HD  . famotidine  20 mg Oral QHS  . feeding supplement (NEPRO CARB STEADY)  237 mL Oral Q24H  . imipenem-cilastatin  250 mg Intravenous Q12H  . ketorolac  15 mg Intravenous Once  . metoprolol tartrate  100 mg Oral BID  . metronidazole  500 mg Intravenous Q8H  . morphine      . multivitamin  1 tablet Oral QHS  . vancomycin  125 mg Oral Q6H  . DISCONTD: darbepoetin (ARANESP) injection - DIALYSIS  100 mcg Intravenous Q Thu-HD   Continuous Infusions:  PRN Meds:.sodium chloride, heparin, morphine injection, oxyCODONE Assessment/Plan: Patient Active Hospital Problem List: Septic shock (08/27/2011)   Assessment: Septic shock was associated with fulminant C. difficile infection and hospital-acquired pneumonia. The patient is fully recovered from his septic shock is no hemodynamically stable. The patient had a regimen of IV Flagyl and by mouth  vancomycin which based on the patient's clinical condition would lend itself to discontinue the Flagyl. However the patient is not taking anything in by mouth with any consistency and thus unable to really evaluate whether or not he still having diarrhea associated with condition in light of this I will continue the IV Flagyl until the patient's appetite has improved to the extent of her able to evaluate whether or not the patient will continue to have diarrhea.    Chronic diastolic CHF (congestive heart failure) (07/23/2010)   Assessment: Clinically well compensated     ESRD (end stage renal disease) ()   Assessment: Hemodialysis dependent and tolerating well by mouth  C. difficile colitis (08/14/2011)   Assessment: Patient is on Flagyl and vancomycin we'll continue for now    CAD (coronary artery disease) (08/14/2011)   Assessment: No chest pain.     Acute respiratory failure with hypoxia (08/27/2011)   Assessment: Resolved     Nausea and vomiting (08/27/2011)   Assessment: Resolved    Hospital-acquired pneumonia (08/27/2011)   Assessment: Pt currently on day # 8 of primaxin. Will D/C after today's dose.  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 He is followed by Dr. Myra Gianotti and was seen in consultation by Dr. Edilia Bo. He will likely require surgery on this admission. [facility]ology has been consulted by Dr. Edilia Bo for cardiac clearance.      LOS: 8 days

## 2011-09-04 DIAGNOSIS — A414 Sepsis due to anaerobes: Secondary | ICD-10-CM

## 2011-09-04 DIAGNOSIS — N186 End stage renal disease: Secondary | ICD-10-CM

## 2011-09-04 DIAGNOSIS — N2581 Secondary hyperparathyroidism of renal origin: Secondary | ICD-10-CM

## 2011-09-04 DIAGNOSIS — I70269 Atherosclerosis of native arteries of extremities with gangrene, unspecified extremity: Secondary | ICD-10-CM

## 2011-09-04 MED ORDER — HEPARIN SODIUM (PORCINE) 1000 UNIT/ML DIALYSIS
40.0000 [IU]/kg | INTRAMUSCULAR | Status: DC | PRN
  Administered 2011-09-05: 2800 [IU] via INTRAVENOUS_CENTRAL
  Filled 2011-09-04: qty 3

## 2011-09-04 NOTE — Progress Notes (Signed)
Patient ID: [name], male   DOB: 03/07/1951, 61 y.o.   MRN: [id number]   [facility] Progress Note    Subjective:   Reports to be feeling fair and inquires about amputation options   Objective:   BP 159/76  Pulse 85  Temp 97.6 F (36.4 C) (Oral)  Resp 18  Ht 5\' 6"  (1.676 m)  Wt 69.4 kg (153 lb)  BMI 24.69 kg/m2  SpO2 96%  Intake/Output Summary (Last 24 hours) at 09/04/11 1107 Last data filed at 09/04/11 0900  Gross per 24 hour  Intake    360 ml  Output   2000 ml  Net  -1640 ml   Weight change: 2.1 kg (4 lb 10.1 oz)  Physical Exam: WUJ:[id number] OOB in recliner AOZ:HYQMV RRR, normal S1 and S2  Resp:CTA bilaterally, no rales HQI:ONGE, flat, NT, BS normal Ext:No LE edema. Left forefoot ischemic with digital gangrene  Imaging: No results found.  Labs: BMET  Lab 09/03/11 1011 09/01/11 0552 08/31/11 0442 08/30/11 0340 08/29/11 0429  NA 137 139 141 140 138  K 4.2 3.7 3.6 3.6 3.7  CL 101 105 108 103 101  CO2 24 23 27 25 27   GLUCOSE 51* 80 91 81 75  BUN 20 18 10 18 13   CREATININE 4.75* 4.30* 3.05* 4.67* 3.60*  ALB -- -- -- -- --  CALCIUM 8.1* 8.5 8.5 8.9 8.7  PHOS 4.1 -- -- 3.8 --   CBC  Lab 09/03/11 1011 09/01/11 0552 08/31/11 0442 08/30/11 0340  WBC 7.4 7.2 7.8 11.0*  NEUTROABS -- -- -- --  HGB 8.8* 8.0* 7.8* 8.6*  HCT 28.1* 26.0* 24.7* 26.7*  MCV 90.1 90.3 89.5 89.9  PLT 159 129* 133* 143*    Medications:      . amLODipine  10 mg Oral Daily  . antiseptic oral rinse  15 mL Mouth Rinse q12n4p  . chlorhexidine  15 mL Mouth Rinse BID  . cinacalcet  60 mg Oral Q breakfast  . darbepoetin      . darbepoetin (ARANESP) injection - DIALYSIS  100 mcg Intravenous Q Wed-HD  . dextrose      . doxercalciferol      . doxercalciferol  6 mcg Intravenous Q M,W,F-HD  . famotidine  20 mg Oral QHS  . feeding supplement (NEPRO CARB STEADY)  237 mL Oral Q24H  . imipenem-cilastatin  250 mg Intravenous Q12H  . ketorolac  15 mg Intravenous Once    . metoprolol tartrate  100 mg Oral BID  . metronidazole  500 mg Intravenous Q8H  . morphine      . multivitamin  1 tablet Oral QHS  . vancomycin  125 mg Oral Q6H  . DISCONTD: darbepoetin (ARANESP) injection - DIALYSIS  100 mcg Intravenous Q Thu-HD     Assessment/ Plan:   1. Sepsis Syndrome with C diff colitis/PNA: Improved on antibiotic therapy with Primaxin, vancomycin and metronidazole.  2.C Diff colitis: Resolved on IV flagyl and PO vancomycin  3. 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

## 2011-09-04 NOTE — Progress Notes (Signed)
Nutrition Follow-up  Pt reports intake is better as is diarrhea. Drinking Nepro, states he enjoys this. Discussed diet liberalization with Dr. Allena Katz, in agreement to change to Low Sodium diet.  Diet Order:  Renal 60 - 70  Supplement: Nepro Shake PO daily  Meds: Scheduled Meds:   . amLODipine  10 mg Oral Daily  . antiseptic oral rinse  15 mL Mouth Rinse q12n4p  . chlorhexidine  15 mL Mouth Rinse BID  . cinacalcet  60 mg Oral Q breakfast  . darbepoetin      . darbepoetin (ARANESP) injection - DIALYSIS  100 mcg Intravenous Q Wed-HD  . dextrose      . doxercalciferol      . doxercalciferol  6 mcg Intravenous Q M,W,F-HD  . famotidine  20 mg Oral QHS  . feeding supplement (NEPRO CARB STEADY)  237 mL Oral Q24H  . imipenem-cilastatin  250 mg Intravenous Q12H  . ketorolac  15 mg Intravenous Once  . metoprolol tartrate  100 mg Oral BID  . metronidazole  500 mg Intravenous Q8H  . morphine      . multivitamin  1 tablet Oral QHS  . vancomycin  125 mg Oral Q6H  . DISCONTD: darbepoetin (ARANESP) injection - DIALYSIS  100 mcg Intravenous Q Thu-HD   Continuous Infusions:  PRN Meds:.sodium chloride, heparin, morphine injection, oxyCODONE  Labs:  CMP     Component Value Date/Time   NA 137 09/03/2011 1011   K 4.2 09/03/2011 1011   CL 101 09/03/2011 1011   CO2 24 09/03/2011 1011   GLUCOSE 51* 09/03/2011 1011   BUN 20 09/03/2011 1011   CREATININE 4.75* 09/03/2011 1011   CALCIUM 8.1* 09/03/2011 1011   PROT 5.4* 08/29/2011 0429   ALBUMIN 1.7* 09/03/2011 1011   AST 31 08/29/2011 0429   ALT 8 08/29/2011 0429   ALKPHOS 60 08/29/2011 0429   BILITOT 0.5 08/29/2011 0429   GFRNONAA 12* 09/03/2011 1011   GFRAA 14* 09/03/2011 1011     Intake/Output Summary (Last 24 hours) at 09/04/11 1021 Last data filed at 09/03/11 1756  Gross per 24 hour  Intake    120 ml  Output   2000 ml  Net  -1880 ml   Weight Status:  69.4 kg - stable  Estimated needs:  2000 - 2200 kcal, 75 - 90 grams protein, 1.2 liters  daily  Nutrition Dx:  Inadequate oral intake r/t food preferences AEB pt report and meal refusals.  Goal:  Intake to meet 90-100% of estimated nutrition needs. Unmet.  Intervention:  Liberalize diet to Low Sodium, per MD. RD to continue to follow.  Monitor:  Weights, labs, PO intake, I/O's  [name] Pager #:  [phone]

## 2011-09-04 NOTE — Progress Notes (Signed)
Physical Therapy Treatment Patient Details Name: [name] MRN: [id number] DOB: 11-20-1950 Today's Date: 09/04/2011 Time: 4098-1191 PT Time Calculation (min): 16 min  PT Assessment / Plan / Recommendation Comments on Treatment Session  pt presents with Sepsis, Aspiration PNA, Resp Failure, and c-diff.  pt notes pain in L foot limits all mobility.  Will continue to follow.      Follow Up Recommendations  Skilled nursing facility    Barriers to Discharge        Equipment Recommendations  Defer to next venue    Recommendations for Other Services    Frequency Min 2X/week   Plan Discharge plan remains appropriate;Frequency remains appropriate    Precautions / Restrictions Precautions Precautions: Fall Restrictions Weight Bearing Restrictions: No   Pertinent Vitals/Pain L foot 8/10.  RN made aware.      Mobility  Bed Mobility Bed Mobility: Rolling Right;Right Sidelying to Sit Rolling Right: 4: Min assist Right Sidelying to Sit: 3: Mod assist;With rails Sitting - Scoot to Edge of Bed: 4: Min assist Details for Bed Mobility Assistance: Assist to raise trunk off bed.  Pt able to perform sitting scoot toward [facility] with min A and pt able to tolerate some wt bearing on feet. Transfers Transfers: Sit to Stand;Stand to Sit;Stand Pivot Transfers Sit to Stand: From bed;1: +2 Total assist;With upper extremity assist Sit to Stand: Patient Percentage: 80% Stand to Sit: 4: Min assist;To chair/3-in-1 Stand Pivot Transfers: 1: +2 Total assist Stand Pivot Transfers: Patient Percentage: 80% Details for Transfer Assistance: Mod verbal cues for hand placement and technique. Pt. not putting weight through left LE Ambulation/Gait Ambulation/Gait Assistance: Not tested (comment) Stairs: No Wheelchair Mobility Wheelchair Mobility: No    Exercises     PT Diagnosis:    PT Problem List:   PT Treatment Interventions:     PT Goals Acute Rehab PT Goals Time For Goal Achievement:  09/14/11 PT Goal: Supine/Side to Sit - Progress: Progressing toward goal PT Goal: Sit to Stand - Progress: Progressing toward goal PT Transfer Goal: Bed to Chair/Chair to Bed - Progress: Progressing toward goal  Visit Information  Last PT Received On: 09/04/11 Assistance Needed: +2 PT/OT Co-Evaluation/Treatment: Yes    Subjective Data  Subjective: Doctor says they may take that leg off.     Cognition  Overall Cognitive Status: Impaired Area of Impairment: Safety/judgement;Following commands Arousal/Alertness: Awake/alert Orientation Level: Appears intact for tasks assessed Behavior During Session: [facility] for tasks performed Following Commands: Follows multi-step commands inconsistently Safety/Judgement: Decreased safety judgement for tasks assessed;Decreased awareness of safety precautions    Balance  Balance Balance Assessed: No  End of Session PT - End of Session Equipment Utilized During Treatment: Gait belt Activity Tolerance: Patient limited by pain Patient left: in chair;with call bell/phone within reach Nurse Communication: Mobility status    Sunny Schlein, [place] [phone] 09/04/2011, 9:11 AM

## 2011-09-04 NOTE — Progress Notes (Addendum)
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 There is currently no erythema or drainage. 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  Filed Vitals:   09/04/11 0925  BP: 159/76  Pulse: 85  Temp: 97.6 F (36.4 C)  Resp: 18    Physical Exam: Pulmonary: Non labored breathing  Abdomen: Soft and non-tender  Musculoskeletal: There are no major deformities.  Neurologic: No focal weakness or paresthesias are detected,  Skin: There are no ulcer or rashes noted. The left great toe is -4th digits are black and cool to touch. He has no sensation in the left foot and no proprioception.  Psychiatric: The patient has normal affect.  Cardiovascular: There is a regular rate and rhythm without significant murmur appreciated. Pedal pulses are not palpable. Left DP is weak via doppler, no PT present. Right DP/PT present via doppler.    CBC    Component Value Date/Time   WBC 7.4 09/03/2011 1011   RBC 3.12* 09/03/2011 1011   HGB 8.8* 09/03/2011 1011   HCT 28.1* 09/03/2011 1011   PLT 159 09/03/2011 1011   MCV 90.1 09/03/2011 1011   MCH 28.2 09/03/2011 1011   MCHC 31.3 09/03/2011 1011   RDW 17.9* 09/03/2011 1011   LYMPHSABS 0.6* 08/26/2011 2306   MONOABS 0.4 08/26/2011 2306   EOSABS 0.0 08/26/2011 2306   BASOSABS 0.1 08/26/2011 2306    BMET    Component Value Date/Time   NA 137 09/03/2011 1011   K 4.2 09/03/2011 1011   CL 101 09/03/2011 1011   CO2 24 09/03/2011 1011   GLUCOSE 51* 09/03/2011 1011   BUN 20 09/03/2011 1011   CREATININE 4.75* 09/03/2011 1011   CALCIUM 8.1* 09/03/2011 1011   GFRNONAA 12* 09/03/2011 1011   GFRAA 14* 09/03/2011 1011    INR    Component Value Date/Time   INR 1.38 08/27/2011 0301     Intake/Output Summary (Last 24 hours) at 09/04/11  1212 Last data filed at 09/04/11 0900  Gross per 24 hour  Intake    360 ml  Output   2000 ml  Net  -1640 ml     Assessment/Plan:Dr. Myra Gianotti will plan on Left Fem-pop bypass with or without transmetatarsal amputation early next week.     Lianne Cure, PA-C [facility] [phone] 09/04/2011 12:12 PM    I agree with the above I will plan for left fem-pop bypass early next week.  He may need a TMA at that time or shortly therafter.  I will see the patient on Monday when I return and make that decision.  Please call if his condition changes before then   Durene Cal

## 2011-09-04 NOTE — Progress Notes (Signed)
Subjective: Patient states that his appetite has improved. He is unsure if he had a consultation with Cardiology at any time to evaluate for perioperative risk. Pt denies any further diarrhea.  Interval History: Vascular surgery plans left Fem-pop bypass early next week.  Objective: Filed Vitals:   09/04/11 0447 09/04/11 0925 09/04/11 1325 09/04/11 1807  BP: 133/70 159/76 124/82 123/76  Pulse: 70 85 77 72  Temp: 98.8 F (37.1 C) 97.6 F (36.4 C) 97.3 F (36.3 C) 97.8 F (36.6 C)  TempSrc: Oral Oral Oral Oral  Resp: 18 18 18 18   Height:      Weight:      SpO2: 98% 96% 97% 98%   Weight change: 2.1 kg (4 lb 10.1 oz)  Intake/Output Summary (Last 24 hours) at 09/04/11 1901 Last data filed at 09/04/11 1810  Gross per 24 hour  Intake 2570.33 ml  Output      0 ml  Net 2570.33 ml    General: Alert, awake, oriented x3, in no acute distress.  HEENT: [place]/AT PEERL, EOMI Neck: Trachea midline,  no masses, no thyromegal,y no JVD, no carotid bruit OROPHARYNX:  Moist, No exudate/ erythema/lesions.  Heart: Regular rate and rhythm, without murmurs, rubs, gallops, PMI non-displaced, no heaves or thrills on palpation.  Lungs: Clear to auscultation, no wheezing or rhonchi noted. No increased vocal fremitus resonant to percussion  Abdomen: Soft, nontender, nondistended, positive bowel sounds, no masses no hepatosplenomegaly noted..  Neuro: No focal neurological deficits noted  Musculoskeletal: No warm swelling or erythema around joints, no spinal tenderness noted. Extremities: Left foot is cool to touch and there is no drainage from foot.   Lab Results:  Basename 09/03/11 1011  NA 137  K 4.2  CL 101  CO2 24  GLUCOSE 51*  BUN 20  CREATININE 4.75*  CALCIUM 8.1*  MG --  PHOS 4.1    Basename 09/03/11 1011  AST --  ALT --  ALKPHOS --  BILITOT --  PROT --  ALBUMIN 1.7*   No results found for this basename: LIPASE:2,AMYLASE:2 in the last 72 hours  Basename 09/03/11 1011  WBC 7.4   NEUTROABS --  HGB 8.8*  HCT 28.1*  MCV 90.1  PLT 159   No results found for this basename: CKTOTAL:3,CKMB:3,CKMBINDEX:3,TROPONINI:3 in the last 72 hours No components found with this basename: POCBNP:3 No results found for this basename: DDIMER:2 in the last 72 hours No results found for this basename: HGBA1C:2 in the last 72 hours No results found for this basename: CHOL:2,HDL:2,LDLCALC:2,TRIG:2,CHOLHDL:2,LDLDIRECT:2 in the last 72 hours No results found for this basename: TSH,T4TOTAL,FREET3,T3FREE,THYROIDAB in the last 72 hours No results found for this basename: VITAMINB12:2,FOLATE:2,FERRITIN:2,TIBC:2,IRON:2,RETICCTPCT:2 in the last 72 hours  Micro Results: Recent Results (from the past 240 hour(s))  CULTURE, BLOOD (ROUTINE X 2)     Status: Normal   Collection Time   08/26/11 11:49 PM      Component Value Range Status Comment   Specimen Description BLOOD LEFT ARM   Final    Special Requests BOTTLES DRAWN AEROBIC ONLY [facility]   Final    Culture  Setup Time [id number]   Final    Culture NO GROWTH 5 DAYS   Final    Report Status 09/02/2011 FINAL   Final   CULTURE, BLOOD (ROUTINE X 2)     Status: Normal   Collection Time   08/26/11 11:56 PM      Component Value Range Status Comment   Specimen Description BLOOD LEFT HAND   Final  Special Requests BOTTLES DRAWN AEROBIC ONLY [facility]   Final    Culture  Setup Time [id number]   Final    Culture NO GROWTH 5 DAYS   Final    Report Status 09/02/2011 FINAL   Final   MRSA PCR SCREENING     Status: Normal   Collection Time   08/27/11  2:55 AM      Component Value Range Status Comment   MRSA by PCR NEGATIVE  NEGATIVE Final   CLOSTRIDIUM DIFFICILE BY PCR     Status: Abnormal   Collection Time   08/27/11  8:54 PM      Component Value Range Status Comment   C difficile by pcr POSITIVE (*) NEGATIVE Final     Studies/Results: Ct Abdomen Pelvis Wo Contrast  08/13/2011  *RADIOLOGY REPORT*  Clinical Data: Abdominal pain.  Unresponsive.  CT ABDOMEN  AND PELVIS WITHOUT CONTRAST  Technique:  Multidetector CT imaging of the abdomen and pelvis was performed following the standard protocol without intravenous contrast.  Comparison: None.  Findings: Lack of intravenous and oral contrast severely limits the exam.  Moderate bilateral pleural effusions and bibasilar consolidation. Coronary artery calcifications.  Aortic valvular calcifications.  Gallbladder is decompressed.  Liver is within normal limits.  Small amount of free fluid anterior to the liver.  The stomach is markedly distended.  Small bowel loops are also markedly distended.  Distal small bowel is decompressed.  There is a small amount of enteric contrast in the lower abdomen at the midline likely contained within a bowel loop.  Colon is also relatively decompressed.  Small amount of free fluid is seen layering in the pelvis.  The kidneys have a markedly abnormal appearance bilaterally.  The left kidney is enlarged and markedly deformed with areas of curvilinear calcification and mixed density.  Similar changes in the right kidney but to a lesser degree.  Underlying renal mass cannot be excluded.  Dense vascular calcifications are noted.  No obvious free intraperitoneal gas.  Metallic staples are seen in the anterior abdominal wall to the left of midline in the lower abdomen.  Prominent degenerative changes of the hip joints there is a lytic area in  in the right pelvis, above the right acetabulum.  There is loss of cortex in the overlying bone.  There is also a lytic area in the posterior inferior right acetabulum on image 88.  These lytic areas are greater than one would expect with simple degenerative changes.  Aggressive process cannot be excluded.  Gas within venous structures near the right groin are likely iatrogenic.  IMPRESSION: Very limited exam.  Bilateral pleural effusions and bibasilar consolidation.  Small bowel obstruction pattern.  The transition point is in the lower abdomen.  There are  lytic lesions about the right acetabulum as described. An aggressive process such as metastatic disease or myeloma cannot be excluded.  MRI may be helpful.  Markedly abnormal appearance of the kidneys.  Renal mass cannot be excluded.  Original Report Authenticated By: Donavan Burnet, M.D.   Dg Chest 1 View  08/13/2011  *RADIOLOGY REPORT*  Clinical Data: Pneumonia  CHEST - 1 VIEW  Comparison: 08/13/2011  Findings: Diffuse airspace disease throughout the right lung. Small right pleural effusion.  Minimal linear atelectasis at the left base.  Cardiomegaly.  Airspace disease on the left has improved.  IMPRESSION: Improved left airspace disease.  Stable airspace disease throughout the right lung with a right pleural effusion.  Original Report Authenticated By: Donavan Burnet, M.D.  Dg Abd 1 View  08/20/2011  *RADIOLOGY REPORT*  Clinical Data: Abdominal distention.  ABDOMEN - 1 VIEW  Comparison: Plain film the abdomen 08/16/2011 and CT abdomen and pelvis 08/13/2011.  Findings: There are some scattered gas filled but nondilated loops of small bowel.  Small amount of gas is seen in the ascending colon.  NG tube is no longer visualized.  Calcified cystic lesion in the left upper quadrant as seen on CT noted.  Atherosclerotic vascular disease is also noted.  IMPRESSION: Some increase in gas filled but nondilated loops of small bowel could be due to progressive small bowel obstruction.  Original Report Authenticated By: Bernadene Bell. Maricela Curet, M.D.   Ct Abdomen Pelvis W Contrast  08/27/2011  *RADIOLOGY REPORT*  Clinical Data: Abdominal pain.  Septic.  CT ABDOMEN AND PELVIS WITH CONTRAST  Technique:  Multidetector CT imaging of the abdomen and pelvis was performed following the standard protocol during bolus administration of intravenous contrast.  Contrast: 40mL OMNIPAQUE IOHEXOL 300 MG/ML  SOLN, OMNIPAQUE IOHEXOL 300 MG/ML  SOLN  Comparison: CT scan 08/13/2011.  Findings: The lung bases demonstrate bilateral  pleural effusions and bilateral infiltrates.  The liver is unremarkable and stable.  No focal lesions or intrahepatic biliary dilatation.  Gallstones and gallbladder sludge are suspected.  The pancreas is grossly normal.  The spleen is normal in size.  No focal lesions.  The adrenal glands demonstrate mild nodular enlargement suggesting nodular hyperplasia.  The kidneys demonstrate multiple bilateral rim calcified cysts. The right kidney is small and scarred.  The stomach is distended with contrast.  No obvious abnormality. There is a cluster of small bowel loops in the right mid abdomen which demonstrate mild distention.  Findings suspicious for an early closed loop obstruction or possible compartmentalized small bowel. No pneumatosis or significant wall thickening.  There is enhancement of the mucosa and the vascular pedicle demonstrates enhancement.  The remaining small bowel loops are normal in caliber. The colon is grossly normal.  There is diffuse and fairly marked rectal wall thickening.  The aorta demonstrates advanced atherosclerotic changes.  No focal aneurysm or dissection.  The major branch vessels demonstrate severe atherosclerotic calcifications.  The bladder is grossly normal.  The prostate gland and seminal vesicles are unremarkable.  There is a small amount of free pelvic fluid.  Examination of the bony structures demonstrates stable lytic lesions.  IMPRESSION:  1.  Findings concerning for an early closed loop obstruction with compartmentalized small bowel loops.  Recommend surgical consultation. 2.  Diffuse and marked rectal wall thickening.  This could be infectious or inflammatory.  3.  Severe atherosclerotic changes involving the aorta and branch vessels. 4.  Bilateral infiltrates and effusions. 5.  Severe cystic kidney disease.  Original Report Authenticated By: P. Loralie Champagne, M.D.   Ir Pta Venous Left  08/18/2011  *RADIOLOGY REPORT*  Clinical history:End-stage renal disease and clotted  left upper extremity cephalic vein fistula.  PROCEDURE(S): DIALYSIS  FISTULA DECLOT; CEPHALIC VEIN ANGIOPLASTY; ULTRASOUND GUIDANCE FOR VASCULAR ACCESS  Physician: Rachelle Hora. Henn, MD  Medications:Heparin 3000 units, TPA 2 mg  Fluoroscopy time: 22.5 minutes  Contrast: 100 ml Omnipaque 300  Procedure:Informed consent was obtained for a declot procedure. The left upper arm was prepped and draped in a sterile fashion. Maximal barrier sterile technique was utilized including caps, mask, sterile gowns, sterile gloves, sterile drape, hand hygiene and skin antiseptic.  The skin was anesthetized with 1% lidocaine. The fistula was accessed using a 21 gauge needle towards the  central venous system with ultrasound guidance.  Ultrasound images were obtained for documentation. Micropuncture catheter was placed. A 6-French vascular sheath was placed.  A Kumpe catheter was advanced in the central veins using a Glidewire.  Central venogram was performed.  The catheter was pulled back as TPA was infused within the cephalic vein thrombus.  Fluoroscopic images were saved for documentation. A Bentson wire was placed centrally and the cephalic vein was treated with the AngioJet thrombectomy device. The cephalic vein was also angioplastied with a 6 mm x 40 mm balloon.  Thrombus was treated again with the AngioJet device and the PTD thrombectomy device.  Using ultrasound guidance, a 21 gauge needle was directed into the vein towards the arterial anastomosis.  Micropuncture dilator set was placed and a 6 [place] vascular sheath was placed. Although there was already flow from the arterial system, a Fogarty balloon was pulled through the vein in order to dislodge thrombus. There was improved flow within the fistula after using the Fogarty balloon.  The central cephalic vein was angioplastied with 6 mm x 40 mm balloon.  At this point, there was flow throughout the fistula.  However, the flow was only temporary.  The central cephalic vein and mid  humeral cephalic vein required angioplasty multiple times in order to keep the cephalic vein patent.   At the end of the procedure, there was a pulse within the cephalic vein but poor outflow.  The vascular sheaths were removed with pursestring sutures.  Findings:The left cephalic vein fistula anastomosis is near the elbow.  The vein was patent near the arterial anastomosis but occluded in the mid and upper arm.  The central cephalic vein was also patent.  A large amount of the thrombus was removed using the AngioJet and mechanical thrombectomy device.  Eventually, there was some flow throughout the fistula after balloon angioplasty of a tight stenosis in the central cephalic vein and recurrent stenosis in the mid humeral cephalic vein. At the end of the procedure, there was still a large amount of thrombus within the aneurysmal segment of the mid humeral cephalic vein.  Partial recanalization of the upper left arm cephalic vein.  Impression:Declot of the left upper extremity fistula.  There is improved flow through the fistula but the outflow is tenuous and high risk to occlude again.  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  Discussed the declot procedure with Dr. Hyman Hopes.   Recommend trying the fistula for dialysis.  If the fistula occludes again, recommend a catheter and surgical consultation for a new access.  Original Report Authenticated By: Richarda Overlie, M.D.   Ir Fluoro Guide Cv Line Left  08/20/2011  *RADIOLOGY REPORT*  Clinical data: End-stage renal disease.  Occluded arm graft.  Needs access for hemodialysis.  TUNNELED HEMODIALYSIS CATHETER PLACEMENT WITH ULTRASOUND AND FLUOROSCOPIC GUIDANCE:  Comparison: None  Technique: The procedure, risks, benefits, and alternatives were explained to the patient.  Questions regarding the procedure were encouraged and answered.  The patient understands and consents to the procedure. The patient was on adequate   antibiotic coverage, so no  additional preprocedural antibiotics were administered. Ultrasound suggested occlusion of the right IJ vein at the thoracic inlet.    Patency of the left IJ vein was confirmed with ultrasound with image documentation. An appropriate skin site was determined. Region was prepped using maximum barrier technique including cap and mask, sterile gown, sterile gloves, large sterile sheet, and Chlorhexidine   as cutaneous antisepsis. The region was  infiltrated locally with 1% lidocaine. Under real-time ultrasound guidance, the left IJ vein was accessed with a 21 gauge micropuncture needle; the needle tip within the vein was confirmed with ultrasound image documentation.   Needle exchanged over the 018 guidewire for transitional dilator, which allowed advancement of a Benson wire into the IVC. Over this, an MPA catheter was advanced. A Hemosplit 23 hemodialysis catheter was tunneled from the right anterior chest wall approach to the left IJ dermatotomy site. The MPA catheter was exchanged over an Amplatz wire for serial vascular dilators which allow placement of a peel-away sheath, through which the catheter was advanced under intermittent fluoroscopy, positioned with its tips in the proximal and midright atrium. Spot chest radiograph confirms good catheter position. No pneumothorax. Catheter was flushed and primed per protocol. Catheter secured externally with O Prolene sutures. The left IJ   dermatotomy site was closed with Dermabond. No immediate complication.  IMPRESSION:  1. Technically successful placement of tunneled left IJ hemodialysis catheter with ultrasound and fluoroscopic guidance. Ready for routine use.  Access management: Remains approachable for percutaneous intervention as needed.  Original Report Authenticated By: Osa Craver, M.D.   Ir Av Dialysis Graft Declot  08/18/2011  *RADIOLOGY REPORT*  Clinical history:End-stage renal disease and clotted left upper extremity cephalic vein fistula.   PROCEDURE(S): DIALYSIS  FISTULA DECLOT; CEPHALIC VEIN ANGIOPLASTY; ULTRASOUND GUIDANCE FOR VASCULAR ACCESS  Physician: Rachelle Hora. Henn, MD  Medications:Heparin 3000 units, TPA 2 mg  Fluoroscopy time: 22.5 minutes  Contrast: 100 ml Omnipaque 300  Procedure:Informed consent was obtained for a declot procedure. The left upper arm was prepped and draped in a sterile fashion. Maximal barrier sterile technique was utilized including caps, mask, sterile gowns, sterile gloves, sterile drape, hand hygiene and skin antiseptic.  The skin was anesthetized with 1% lidocaine. The fistula was accessed using a 21 gauge needle towards the central venous system with ultrasound guidance.  Ultrasound images were obtained for documentation. Micropuncture catheter was placed. A 6-French vascular sheath was placed.  A Kumpe catheter was advanced in the central veins using a Glidewire.  Central venogram was performed.  The catheter was pulled back as TPA was infused within the cephalic vein thrombus.  Fluoroscopic images were saved for documentation. A Bentson wire was placed centrally and the cephalic vein was treated with the AngioJet thrombectomy device. The cephalic vein was also angioplastied with a 6 mm x 40 mm balloon.  Thrombus was treated again with the AngioJet device and the PTD thrombectomy device.  Using ultrasound guidance, a 21 gauge needle was directed into the vein towards the arterial anastomosis.  Micropuncture dilator set was placed and a 6 [place] vascular sheath was placed. Although there was already flow from the arterial system, a Fogarty balloon was pulled through the vein in order to dislodge thrombus. There was improved flow within the fistula after using the Fogarty balloon.  The central cephalic vein was angioplastied with 6 mm x 40 mm balloon.  At this point, there was flow throughout the fistula.  However, the flow was only temporary.  The central cephalic vein and mid humeral cephalic vein required angioplasty  multiple times in order to keep the cephalic vein patent.   At the end of the procedure, there was a pulse within the cephalic vein but poor outflow.  The vascular sheaths were removed with pursestring sutures.  Findings:The left cephalic vein fistula anastomosis is near the elbow.  The vein was patent near the arterial anastomosis but occluded  in the mid and upper arm.  The central cephalic vein was also patent.  A large amount of the thrombus was removed using the AngioJet and mechanical thrombectomy device.  Eventually, there was some flow throughout the fistula after balloon angioplasty of a tight stenosis in the central cephalic vein and recurrent stenosis in the mid humeral cephalic vein. At the end of the procedure, there was still a large amount of thrombus within the aneurysmal segment of the mid humeral cephalic vein.  Partial recanalization of the upper left arm cephalic vein.  Impression:Declot of the left upper extremity fistula.  There is improved flow through the fistula but the outflow is tenuous and high risk to occlude again.  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  Discussed the declot procedure with Dr. Hyman Hopes.   Recommend trying the fistula for dialysis.  If the fistula occludes again, recommend a catheter and surgical consultation for a new access.  Original Report Authenticated By: Richarda Overlie, M.D.   Ir Angio Av Shunt Addl Access  08/18/2011  *RADIOLOGY REPORT*  Clinical history:End-stage renal disease and clotted left upper extremity cephalic vein fistula.  PROCEDURE(S): DIALYSIS  FISTULA DECLOT; CEPHALIC VEIN ANGIOPLASTY; ULTRASOUND GUIDANCE FOR VASCULAR ACCESS  Physician: Rachelle Hora. Henn, MD  Medications:Heparin 3000 units, TPA 2 mg  Fluoroscopy time: 22.5 minutes  Contrast: 100 ml Omnipaque 300  Procedure:Informed consent was obtained for a declot procedure. The left upper arm was prepped and draped in a sterile fashion. Maximal barrier sterile technique was  utilized including caps, mask, sterile gowns, sterile gloves, sterile drape, hand hygiene and skin antiseptic.  The skin was anesthetized with 1% lidocaine. The fistula was accessed using a 21 gauge needle towards the central venous system with ultrasound guidance.  Ultrasound images were obtained for documentation. Micropuncture catheter was placed. A 6-French vascular sheath was placed.  A Kumpe catheter was advanced in the central veins using a Glidewire.  Central venogram was performed.  The catheter was pulled back as TPA was infused within the cephalic vein thrombus.  Fluoroscopic images were saved for documentation. A Bentson wire was placed centrally and the cephalic vein was treated with the AngioJet thrombectomy device. The cephalic vein was also angioplastied with a 6 mm x 40 mm balloon.  Thrombus was treated again with the AngioJet device and the PTD thrombectomy device.  Using ultrasound guidance, a 21 gauge needle was directed into the vein towards the arterial anastomosis.  Micropuncture dilator set was placed and a 6 [place] vascular sheath was placed. Although there was already flow from the arterial system, a Fogarty balloon was pulled through the vein in order to dislodge thrombus. There was improved flow within the fistula after using the Fogarty balloon.  The central cephalic vein was angioplastied with 6 mm x 40 mm balloon.  At this point, there was flow throughout the fistula.  However, the flow was only temporary.  The central cephalic vein and mid humeral cephalic vein required angioplasty multiple times in order to keep the cephalic vein patent.   At the end of the procedure, there was a pulse within the cephalic vein but poor outflow.  The vascular sheaths were removed with pursestring sutures.  Findings:The left cephalic vein fistula anastomosis is near the elbow.  The vein was patent near the arterial anastomosis but occluded in the mid and upper arm.  The central cephalic vein was also  patent.  A large amount of the thrombus was removed using the AngioJet  and mechanical thrombectomy device.  Eventually, there was some flow throughout the fistula after balloon angioplasty of a tight stenosis in the central cephalic vein and recurrent stenosis in the mid humeral cephalic vein. At the end of the procedure, there was still a large amount of thrombus within the aneurysmal segment of the mid humeral cephalic vein.  Partial recanalization of the upper left arm cephalic vein.  Impression:Declot of the left upper extremity fistula.  There is improved flow through the fistula but the outflow is tenuous and high risk to occlude again.  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  Discussed the declot procedure with Dr. Hyman Hopes.   Recommend trying the fistula for dialysis.  If the fistula occludes again, recommend a catheter and surgical consultation for a new access.  Original Report Authenticated By: Richarda Overlie, M.D.   Ir US Guide Vasc Access Left  08/20/2011  *RADIOLOGY REPORT*  Clinical data: End-stage renal disease.  Occluded arm graft.  Needs access for hemodialysis.  TUNNELED HEMODIALYSIS CATHETER PLACEMENT WITH ULTRASOUND AND FLUOROSCOPIC GUIDANCE:  Comparison: None  Technique: The procedure, risks, benefits, and alternatives were explained to the patient.  Questions regarding the procedure were encouraged and answered.  The patient understands and consents to the procedure. The patient was on adequate   antibiotic coverage, so no additional preprocedural antibiotics were administered. Ultrasound suggested occlusion of the right IJ vein at the thoracic inlet.    Patency of the left IJ vein was confirmed with ultrasound with image documentation. An appropriate skin site was determined. Region was prepped using maximum barrier technique including cap and mask, sterile gown, sterile gloves, large sterile sheet, and Chlorhexidine   as cutaneous antisepsis. The region was  infiltrated locally with 1% lidocaine. Under real-time ultrasound guidance, the left IJ vein was accessed with a 21 gauge micropuncture needle; the needle tip within the vein was confirmed with ultrasound image documentation.   Needle exchanged over the 018 guidewire for transitional dilator, which allowed advancement of a Benson wire into the IVC. Over this, an MPA catheter was advanced. A Hemosplit 23 hemodialysis catheter was tunneled from the right anterior chest wall approach to the left IJ dermatotomy site. The MPA catheter was exchanged over an Amplatz wire for serial vascular dilators which allow placement of a peel-away sheath, through which the catheter was advanced under intermittent fluoroscopy, positioned with its tips in the proximal and midright atrium. Spot chest radiograph confirms good catheter position. No pneumothorax. Catheter was flushed and primed per protocol. Catheter secured externally with O Prolene sutures. The left IJ   dermatotomy site was closed with Dermabond. No immediate complication.  IMPRESSION:  1. Technically successful placement of tunneled left IJ hemodialysis catheter with ultrasound and fluoroscopic guidance. Ready for routine use.  Access management: Remains approachable for percutaneous intervention as needed.  Original Report Authenticated By: Osa Craver, M.D.   Ir US Guide Vasc Access Left  08/18/2011  *RADIOLOGY REPORT*  Clinical history:End-stage renal disease and clotted left upper extremity cephalic vein fistula.  PROCEDURE(S): DIALYSIS  FISTULA DECLOT; CEPHALIC VEIN ANGIOPLASTY; ULTRASOUND GUIDANCE FOR VASCULAR ACCESS  Physician: Rachelle Hora. Henn, MD  Medications:Heparin 3000 units, TPA 2 mg  Fluoroscopy time: 22.5 minutes  Contrast: 100 ml Omnipaque 300  Procedure:Informed consent was obtained for a declot procedure. The left upper arm was prepped and draped in a sterile fashion. Maximal barrier sterile technique was utilized including caps, mask, sterile  gowns, sterile gloves, sterile drape, hand hygiene and  skin antiseptic.  The skin was anesthetized with 1% lidocaine. The fistula was accessed using a 21 gauge needle towards the central venous system with ultrasound guidance.  Ultrasound images were obtained for documentation. Micropuncture catheter was placed. A 6-French vascular sheath was placed.  A Kumpe catheter was advanced in the central veins using a Glidewire.  Central venogram was performed.  The catheter was pulled back as TPA was infused within the cephalic vein thrombus.  Fluoroscopic images were saved for documentation. A Bentson wire was placed centrally and the cephalic vein was treated with the AngioJet thrombectomy device. The cephalic vein was also angioplastied with a 6 mm x 40 mm balloon.  Thrombus was treated again with the AngioJet device and the PTD thrombectomy device.  Using ultrasound guidance, a 21 gauge needle was directed into the vein towards the arterial anastomosis.  Micropuncture dilator set was placed and a 6 [place] vascular sheath was placed. Although there was already flow from the arterial system, a Fogarty balloon was pulled through the vein in order to dislodge thrombus. There was improved flow within the fistula after using the Fogarty balloon.  The central cephalic vein was angioplastied with 6 mm x 40 mm balloon.  At this point, there was flow throughout the fistula.  However, the flow was only temporary.  The central cephalic vein and mid humeral cephalic vein required angioplasty multiple times in order to keep the cephalic vein patent.   At the end of the procedure, there was a pulse within the cephalic vein but poor outflow.  The vascular sheaths were removed with pursestring sutures.  Findings:The left cephalic vein fistula anastomosis is near the elbow.  The vein was patent near the arterial anastomosis but occluded in the mid and upper arm.  The central cephalic vein was also patent.  A large amount of the thrombus  was removed using the AngioJet and mechanical thrombectomy device.  Eventually, there was some flow throughout the fistula after balloon angioplasty of a tight stenosis in the central cephalic vein and recurrent stenosis in the mid humeral cephalic vein. At the end of the procedure, there was still a large amount of thrombus within the aneurysmal segment of the mid humeral cephalic vein.  Partial recanalization of the upper left arm cephalic vein.  Impression:Declot of the left upper extremity fistula.  There is improved flow through the fistula but the outflow is tenuous and high risk to occlude again.  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  Discussed the declot procedure with Dr. Hyman Hopes.   Recommend trying the fistula for dialysis.  If the fistula occludes again, recommend a catheter and surgical consultation for a new access.  Original Report Authenticated By: Richarda Overlie, M.D.   Dg Chest Port 1 View  08/30/2011  *RADIOLOGY REPORT*  Clinical Data: Check endotracheal tube placement.  Pneumonia.  PORTABLE CHEST - 1 VIEW  Comparison: Portable chest 08/28/2011.  Findings: The patient has been extubated.  The NG tube was removed as well.  A left IJ dialysis catheter is stable.  There is slight improved aeration of the left lung.  Right-sided airspace disease is not significantly changed.  Bilateral pleural effusions are evident, right greater than left.  Mild cardiomegaly is stable. Pancreatic calcifications are again noted.  IMPRESSION:  1.  Slight improved aeration of the left lung. 2.  Right-sided airspace disease is stable. 3.  Slight increase in bilateral pleural effusions, right greater than left. 4.  The patient has been  extubated.  The NG tube was removed as well. 5.  The appearance remains concerning for bilateral pneumonia.  Original Report Authenticated By: Jamesetta Orleans. MATTERN, M.D.   Dg Chest Port 1 View  08/28/2011  *RADIOLOGY REPORT*  Clinical Data: Intubated patient.   PORTABLE CHEST - 1 VIEW  Comparison: Portable chest 08/27/2011.  Findings: Support tubes and lines are unchanged.  Extensive bilateral airspace disease persists.  Aeration in the right chest has worsened.  Right greater than left pleural effusions noted.  IMPRESSION: Extensive bilateral airspace disease shows worsening on the right.  Original Report Authenticated By: Bernadene Bell. D'ALESSIO, M.D.   Dg Chest Portable 1 View  08/27/2011  *RADIOLOGY REPORT*  Clinical Data: Line placement.  PORTABLE CHEST - 1 VIEW  Comparison: 08/26/2011.  Findings: The support apparatus is in good position.  The endotracheal tube is now 4.6 cm above the carina.  Interval progressive airspace consolidation in the left lung could reflect worsening edema or infiltrate.  Small effusions are stable.  IMPRESSION:  1.  Support apparatus in good position without complicating features. 2.  Worsening airspace consolidation, left greater than right could reflect worsening edema or infiltrate. 3.  Stable small effusions.  Original Report Authenticated By: P. Loralie Champagne, M.D.   Dg Chest Port 1 View  08/26/2011  *RADIOLOGY REPORT*  Clinical Data: Endotracheal tube placement.  PORTABLE CHEST - 1 VIEW  Comparison: 08/16/2011.  Findings: The endotracheal tube is right at the right mainstem bronchus and should be retracted 3 cm.  The dialysis catheter is stable.  The NG tube is in the stomach.  There is pulmonary edema, areas of atelectasis and a right pleural effusion.  IMPRESSION: The endotracheal tube is at the right mainstem bronchus and should be retracted 3 cm.  Original Report Authenticated By: P. Loralie Champagne, M.D.   Dg Chest Port 1 View  08/16/2011  *RADIOLOGY REPORT*  Clinical Data: Pneumonia.  Pleural effusion.  PORTABLE CHEST - 1 VIEW  Comparison: 08/15/2011  Findings: Nasogastric tube extends into the stomach.  Cardiomegaly noted with mild interstitial accentuation and increased obscuration of the right hemidiaphragm primarily  ascribed to pleural effusion and associated passive atelectasis.  There is mild atelectasis at the left lung base along with a small left pleural effusion.  Lung volumes are slightly worsened compared to yesterday's exam.  Atherosclerotic calcification of the aortic arch noted. Glenohumeral degenerative findings noted.  IMPRESSION:  1.  Increase in apparent size of the right pleural effusion with associated passive atelectasis. 2.  Small left pleural effusion with left basilar atelectasis also noted. 3.  Cardiomegaly with minimal interstitial accentuation.  Original Report Authenticated By: Dellia Cloud, M.D.   Dg Chest Port 1 View  08/15/2011  *RADIOLOGY REPORT*  Clinical Data: Status post right thoracentesis.  PORTABLE CHEST - 1 VIEW  Comparison: Earlier the same day  Findings: 1556 hours.  Right pleural effusion has decreased in the interval.  No evidence for pneumothorax.  There is some subsegmental atelectasis at the right base. The NG tube passes into the stomach although the distal tip position is not included on the film. The cardiopericardial silhouette is enlarged.  Diffuse interstitial opacity is improved in the interval, suggesting resolving edema.  IMPRESSION: No evidence for pneumothorax after right thoracentesis.  Original Report Authenticated By: ERIC A. MANSELL, M.D.   Dg Chest Port 1 View  08/15/2011  *RADIOLOGY REPORT*  Clinical Data: Shortness of breath and pleural effusion.  PORTABLE CHEST - 1 VIEW  Comparison: 08/14/2011  Findings: The NG tube passes into the stomach although the distal tip position is not included on the film. The cardiopericardial silhouette is enlarged.  Vascular congestion with diffuse interstitial opacity is consistent with edema.  There is bibasilar atelectasis and right pleural effusion.  IMPRESSION: No substantial change.  Cardiomegaly with edema and right pleural effusion.  Original Report Authenticated By: ERIC A. MANSELL, M.D.   Portable Chest Xray In  Am  08/14/2011  *RADIOLOGY REPORT*  Clinical Data: Respiratory failure  PORTABLE CHEST - 1 VIEW  Comparison: Aug 13, 2011  Findings: There is cardiomegaly and pulmonary edema, as well as a moderate sized right pleural effusion.  Overall, the findings  have not significantly changed.  The nasogastric tube tip courses off the inferior film.  IMPRESSION: No significant change in cardiomegaly, pulmonary edema, moderate sized right pleural effusion.  Original Report Authenticated By: Brandon Melnick, M.D.   Dg Chest Port 1 View  08/13/2011  *RADIOLOGY REPORT*  Clinical Data: Unresponsive.  Shortness of breath and abdominal distention.  PORTABLE CHEST - 1 VIEW  Comparison: 07/31/2011.  Findings: Trachea is midline.  Heart size stable.  There is bilateral air space disease, right greater than left, with progression from 07/31/2011.  Associated right pleural effusion. There may be a tiny left pleural effusion.  IMPRESSION: Worsening bilateral air space disease with an associated right pleural effusion.  Question tiny left pleural effusion.  Congestive heart failure is likely.  Original Report Authenticated By: Reyes Ivan, M.D.   Dg Abd Portable 1v  08/16/2011  *RADIOLOGY REPORT*  Clinical Data: Small bowel obstruction  PORTABLE ABDOMEN - 1 VIEW  Comparison: 08/15/2011  Findings: Vascular calcifications and rim calcified left renal lesions noted.  Nasogastric tube extends into the stomach.  The right femoral line projects over the pelvis.  Left pelvic skin clips noted.  Currently most of the bowel is gasless.  The visualized loops of bowel do not demonstrate bowel dilatation.  Lumbar spondylosis is again noted. Pleural effusions noted at the lung bases, with cardiomegaly.  IMPRESSION:  1.  No dilated bowel is currently visible, although much of the bowel is gasless. 2.  Small right and smaller left pleural effusions. 3.  Calcified left renal cysts or masses. 4.  Vascular calcifications. 5.  Right femoral line  tip projects over the right sacral ala.  Original Report Authenticated By: Dellia Cloud, M.D.   Dg Abd Portable 1v  08/15/2011  *RADIOLOGY REPORT*  Clinical Data: Small bowel obstruction.  PORTABLE ABDOMEN - 1 VIEW  Comparison: Plain films and CT abdomen and pelvis 08/13/2011.  Findings: New NG tube and right femoral catheter are in place. Surgical staples to the left of midline again noted.  Gas filled small bowel loops are again seen but appear less numerous and prominent.  Bilateral upper quadrant calcifications are noted and consistent with renal calcifications seen on CT.  IMPRESSION: Mild improvement small bowel obstruction.  Original Report Authenticated By: Bernadene Bell. Maricela Curet, M.D.   Dg Abd Portable 1v  08/13/2011  *RADIOLOGY REPORT*  Clinical Data: Unresponsive.  Shortness of breath and abdominal distention.  PORTABLE ABDOMEN - 1 VIEW  Comparison: None.  Findings: There is mild gaseous distention of small bowel loops. Some colonic gas and stool are seen.  Surgical skin staples overlie the left lower quadrant.  Peripherally calcified structure in the left paraspinal region has the shape of the left kidney.  Additional tubular calcifications are seen in the left upper quadrant.  There may be  small calcifications in the right paraspinal region.  IMPRESSION:  1.  Mild small bowel distention with possible wall thickening.  CT abdomen pelvis with contrast would likely be helpful in further evaluation, as clinically indicated. 2.  Calcified structures in the left upper quadrant, and likely right paraspinal region, as discussed above.  These could also be further assessed with CT.  Original Report Authenticated By: Reyes Ivan, M.D.   Dg Abd Portable 2v  08/28/2011  *RADIOLOGY REPORT*  Clinical Data: Abdominal distention.  Question ileus or small bowel obstruction.  PORTABLE ABDOMEN - 2 VIEW  Comparison: CT abdomen pelvis 08/27/2011 and plain film of the abdomen 08/20/2011.  Findings: The  abdomen is nearly gasless.  NG tube and right groin catheter are in place.  Calcified cystic lesion in the left upper quadrant as seen on CT scan again noted.  Multiple calcifications in the spleen are also again seen.  IMPRESSION: The abdomen is nearly gasless making assessment of small bowel difficult.  No definite acute finding.  Original Report Authenticated By: Bernadene Bell. Maricela Curet, M.D.    Medications: I have reviewed the patient's current medications. Scheduled Meds:    . amLODipine  10 mg Oral Daily  . antiseptic oral rinse  15 mL Mouth Rinse q12n4p  . chlorhexidine  15 mL Mouth Rinse BID  . cinacalcet  60 mg Oral Q breakfast  . darbepoetin      . darbepoetin (ARANESP) injection - DIALYSIS  100 mcg Intravenous Q Wed-HD  . doxercalciferol      . doxercalciferol  6 mcg Intravenous Q M,W,F-HD  . famotidine  20 mg Oral QHS  . feeding supplement (NEPRO CARB STEADY)  237 mL Oral Q24H  . imipenem-cilastatin  250 mg Intravenous Q12H  . metoprolol tartrate  100 mg Oral BID  . metronidazole  500 mg Intravenous Q8H  . multivitamin  1 tablet Oral QHS  . vancomycin  125 mg Oral Q6H   Continuous Infusions:  PRN Meds:.sodium chloride, heparin, heparin, morphine injection, oxyCODONE Assessment/Plan: Patient Active Hospital Problem List: Septic shock (08/27/2011)   Assessment: Septic shock was associated with fulminant C. difficile infection and hospital-acquired pneumonia. The patient is fully recovered from his septic shock is no hemodynamically stable.diarrhea.    Chronic diastolic CHF (congestive heart failure) (07/23/2010)   Assessment: Clinically well compensated     ESRD (end stage renal disease) ()   Assessment: Hemodialysis dependent and tolerating well by mouth     C. difficile colitis (08/14/2011)   Assessment:  The patient had a regimen of IV Flagyl and by mouth vancomycin which based on the patient's clinical condition would lend itself to discontinue the Flagyl. The patient has  had no further diarrhea and thus I will discontinue the IV flagyl and continue the oral vancomycin.   CAD (coronary artery disease) (08/14/2011)   Assessment: No chest pain. Will speak with [facility] and/ Cardiology about perioperative evaluation.    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    LOS: 9 days

## 2011-09-04 NOTE — Progress Notes (Signed)
ANTIBIOTIC CONSULT NOTE - FOLLOW UP  Pharmacy Consult for Flagyl Indication: Cdiff  No Known Allergies  Patient Measurements: Height: 5\' 6"  (167.6 cm) Weight: 153 lb (69.4 kg) IBW/kg (Calculated) : 63.8  Adjusted Body Weight:   Vital Signs: Temp: 98.8 F (37.1 C) (06/13 0447) Temp src: Oral (06/13 0447) BP: 133/70 mmHg (06/13 0447) Pulse Rate: 70  (06/13 0447) Intake/Output from previous day: 06/12 0701 - 06/13 0700 In: 240 [P.O.:240] Out: 2000  Intake/Output from this shift:    Labs:  Basename 09/03/11 1011  WBC 7.4  HGB 8.8*  PLT 159  LABCREA --  CREATININE 4.75*   Estimated Creatinine Clearance: 14.6 ml/min (by C-G formula based on Cr of 4.75). No results found for this basename: VANCOTROUGH:2,VANCOPEAK:2,VANCORANDOM:2,GENTTROUGH:2,GENTPEAK:2,GENTRANDOM:2,TOBRATROUGH:2,TOBRAPEAK:2,TOBRARND:2,AMIKACINPEAK:2,AMIKACINTROU:2,AMIKACIN:2, in the last 72 hours   Microbiology: Recent Results (from the past 720 hour(s))  CULTURE, BLOOD (ROUTINE X 2)     Status: Normal   Collection Time   08/13/11  3:15 PM      Component Value Range Status Comment   Specimen Description BLOOD CENTRAL LINE   Final    Special Requests BOTTLES DRAWN AEROBIC AND ANAEROBIC   Final    Culture  Setup Time [id number]   Final    Culture     Final    Value: DIPHTHEROIDS(CORYNEBACTERIUM SPECIES)     Note: Standardized susceptibility testing for this organism is not available.     Note: Gram Stain Report Called to,Read Back By and Verified With: [name] @1150  08/15/11 BY KRAWS   Report Status 08/16/2011 FINAL   Final   CULTURE, BLOOD (ROUTINE X 2)     Status: Normal   Collection Time   08/13/11  3:50 PM      Component Value Range Status Comment   Specimen Description BLOOD HAND RIGHT   Final    Special Requests BOTTLES DRAWN AEROBIC ONLY 5CC   Final    Culture  Setup Time [id number]   Final    Culture NO GROWTH 5 DAYS   Final    Report Status 08/20/2011 FINAL   Final   MRSA  PCR SCREENING     Status: Normal   Collection Time   08/13/11  6:50 PM      Component Value Range Status Comment   MRSA by PCR NEGATIVE  NEGATIVE Final   CLOSTRIDIUM DIFFICILE BY PCR     Status: Abnormal   Collection Time   08/13/11 10:46 PM      Component Value Range Status Comment   C difficile by pcr POSITIVE (*) NEGATIVE Final   BODY FLUID CULTURE     Status: Normal   Collection Time   08/15/11  5:58 PM      Component Value Range Status Comment   Specimen Description PLEURAL FLUID RIGHT   Final    Special Requests NONE   Final    Gram Stain     Final    Value: RARE WBC PRESENT,BOTH PMN AND MONONUCLEAR     NO ORGANISMS SEEN   Culture NO GROWTH 3 DAYS   Final    Report Status 08/19/2011 FINAL   Final   CULTURE, BLOOD (ROUTINE X 2)     Status: Normal   Collection Time   08/26/11 11:49 PM      Component Value Range Status Comment   Specimen Description BLOOD LEFT ARM   Final    Special Requests BOTTLES DRAWN AEROBIC ONLY [place]   Final    Culture  Setup Time [id number]  Final    Culture NO GROWTH 5 DAYS   Final    Report Status 09/02/2011 FINAL   Final   CULTURE, BLOOD (ROUTINE X 2)     Status: Normal   Collection Time   08/26/11 11:56 PM      Component Value Range Status Comment   Specimen Description BLOOD LEFT HAND   Final    Special Requests BOTTLES DRAWN AEROBIC ONLY [facility]   Final    Culture  Setup Time [id number]   Final    Culture NO GROWTH 5 DAYS   Final    Report Status 09/02/2011 FINAL   Final   MRSA PCR SCREENING     Status: Normal   Collection Time   08/27/11  2:55 AM      Component Value Range Status Comment   MRSA by PCR NEGATIVE  NEGATIVE Final   CLOSTRIDIUM DIFFICILE BY PCR     Status: Abnormal   Collection Time   08/27/11  8:54 PM      Component Value Range Status Comment   C difficile by pcr POSITIVE (*) NEGATIVE Final     Anti-infectives     Start     Dose/Rate Route Frequency Ordered Stop   08/31/11 1830   vancomycin (VANCOCIN) 50 mg/mL oral solution 125  mg        125 mg Oral 4 times per day 08/31/11 1821     08/30/11 1200   vancomycin (VANCOCIN) 750 mg in sodium chloride 0.9 % 150 mL IVPB  Status:  Discontinued        750 mg 150 mL/hr over 60 Minutes Intravenous Every T-Th-Sa (Hemodialysis) 08/29/11 0939 08/31/11 1357   08/29/11 1200   vancomycin (VANCOCIN) 750 mg in sodium chloride 0.9 % 150 mL IVPB  Status:  Discontinued        750 mg 150 mL/hr over 60 Minutes Intravenous Every M-W-F (Hemodialysis) 08/27/11 1157 08/29/11 0939   08/28/11 1530   vancomycin (VANCOCIN) 750 mg in sodium chloride 0.9 % 150 mL IVPB        750 mg 150 mL/hr over 60 Minutes Intravenous Once in dialysis 08/28/11 1301 08/28/11 2014   08/28/11 1200   vancomycin (VANCOCIN) 500 mg in sodium chloride irrigation 0.9 % 100 mL ENEMA  Status:  Discontinued     Comments: Retain for 1 hour      500 mg Rectal 4 times per day 08/28/11 1136 08/31/11 1821   08/27/11 2200   imipenem-cilastatin (PRIMAXIN) 250 mg in sodium chloride 0.9 % 100 mL IVPB        250 mg 200 mL/hr over 30 Minutes Intravenous Every 12 hours 08/27/11 0255 09/03/11 2359   08/27/11 1500   vancomycin (VANCOCIN) 500 mg in sodium chloride irrigation 0.9 % 100 mL ENEMA  Status:  Discontinued     Comments: Retain for 1 hour      500 mg Rectal Every 8 hours 08/27/11 1404 08/28/11 1136   08/27/11 1200   metroNIDAZOLE (FLAGYL) IVPB 500 mg        500 mg 100 mL/hr over 60 Minutes Intravenous Every 8 hours 08/27/11 0255     08/27/11 1200   vancomycin (VANCOCIN) 750 mg in sodium chloride 0.9 % 150 mL IVPB  Status:  Discontinued        750 mg 150 mL/hr over 60 Minutes Intravenous Every M-W-F (Hemodialysis) 08/27/11 0255 08/27/11 1157   08/27/11 0600   vancomycin (VANCOCIN) 50 mg/mL oral solution 250 mg  Status:  Discontinued        250 mg Oral 4 times per day 08/27/11 0255 08/27/11 1404   08/27/11 0400   vancomycin (VANCOCIN) 500 mg in sodium chloride 0.9 % 100 mL IVPB        500 mg 100 mL/hr over 60 Minutes  Intravenous NOW 08/27/11 0255 08/27/11 0530   08/27/11 0230   imipenem-cilastatin (PRIMAXIN) 500 mg in sodium chloride 0.9 % 100 mL IVPB        500 mg 200 mL/hr over 30 Minutes Intravenous  Once 08/27/11 0220 08/27/11 0350   08/27/11 0230   metroNIDAZOLE (FLAGYL) IVPB 500 mg        500 mg 100 mL/hr over 60 Minutes Intravenous  Once 08/27/11 0220 08/27/11 0343   08/27/11 0230   vancomycin (VANCOCIN) IVPB 1000 mg/200 mL premix        1,000 mg 200 mL/hr over 60 Minutes Intravenous  Once 08/27/11 0220 08/27/11 0348   08/27/11 0129   metroNIDAZOLE (FLAGYL) 5-0.79 MG/ML-% IVPB     Comments: PREYER, ELIZABETH: cabinet override         08/27/11 0129 08/27/11 1329          Assessment: 60yom with ESRD on HD on Flagyl IV Day 9 and Vancomycin PO/Enema Day 9 for positive Cdiff. Patient completed 8 days of PNA therapy (Primaxin) on 6/12. Patient is afebrile and WBC wnl. Flagyl is appropriately dosed for HD. Plan is to treat with Cdiff antibiotics for 10 days after completion of Primaxin.   Plan:  1. Continue Flagyl 500mg  IV q8h - consider changing to oral regimen if clinically appropriate 2. Pharmacy will sign off now that PNA antibiotics complete and Flagyl dose will remain the same for ESRD - please reconsult if additional assistance is needed.    [name] [phone] 09/04/2011,9:42 AM

## 2011-09-04 NOTE — Progress Notes (Signed)
Occupational Therapy Treatment Patient Details Name: [name] MRN: [id number] DOB: 09/13/1950 Today's Date: 09/04/2011 Time: 4098-1191 OT Time Calculation (min): 14 min  OT Assessment / Plan / Recommendation Comments on Treatment Session Pt. presents with sepsis and left LE pain limiting functional mobility. Pt. with improvement in functional mobility today with OOB ADLs    Follow Up Recommendations  Skilled nursing facility       Equipment Recommendations  Defer to next venue       Frequency Min 2X/week   Plan Discharge plan remains appropriate    Precautions / Restrictions Precautions Precautions: Fall Restrictions Weight Bearing Restrictions: No   Pertinent Vitals/Pain 8/10  Left LE   ADL  Grooming: Performed;Wash/dry face;Wash/dry hands;Set up Where Assessed - Grooming: Unsupported sitting Lower Body Dressing: Performed;Moderate assistance (With donning pants due to pt. unable to reach down to feet) Where Assessed - Lower Body Dressing: Sopported sit to stand Toilet Transfer: Simulated;+2 Total assistance Toilet Transfer: Patient Percentage: 70% Toilet Transfer Method: Stand pivot Acupuncturist:  Nurse, children's) Equipment Used: Rolling walker Transfers/Ambulation Related to ADLs: Assist to move RW provided during transfer ADL Comments: Pt. needing assist to reach bil feet to don pants. Pt. educated on crossing foot over opposite knee to complete and also the use of AE to complete LB dressing       OT Goals Acute Rehab OT Goals OT Goal Formulation: With patient Time For Goal Achievement: 09/15/11 Potential to Achieve Goals: Good ADL Goals Pt Will Perform Grooming: with supervision;Standing at sink ADL Goal: Grooming - Progress: Progressing toward goals Pt Will Perform Upper Body Dressing: with set-up;with supervision;Standing ADL Goal: Upper Body Dressing - Progress: Progressing toward goals Pt Will Perform Lower Body Dressing: with  supervision;Sit to stand from bed ADL Goal: Lower Body Dressing - Progress: Progressing toward goals Pt Will Transfer to Toilet: with supervision;with DME;3-in-1 ADL Goal: Toilet Transfer - Progress: Progressing toward goals Additional ADL Goal #1: Pt will transition from supine to sitting with supervision in preparation for selfcare tasks. ADL Goal: Additional Goal #1 - Progress: Progressing toward goals  Visit Information  Last OT Received On: 09/04/11 Assistance Needed: +2 PT/OT Co-Evaluation/Treatment: Yes          Cognition  Overall Cognitive Status: Impaired Area of Impairment: Safety/judgement;Following commands Arousal/Alertness: Awake/alert Orientation Level: Appears intact for tasks assessed Behavior During Session: [facility] for tasks performed Following Commands: Follows multi-step commands inconsistently Safety/Judgement: Decreased safety judgement for tasks assessed;Decreased awareness of safety precautions    Mobility Bed Mobility Bed Mobility: Rolling Right;Right Sidelying to Sit Rolling Right: 4: Min assist Right Sidelying to Sit: 3: Mod assist;With rails Sitting - Scoot to Edge of Bed: 4: Min assist Details for Bed Mobility Assistance: Assist to raise trunk off bed.  Pt able to perform sitting scoot toward [facility] with min A and pt able to tolerate some wt bearing on feet. Transfers Transfers: Sit to Stand Sit to Stand: From bed;1: +2 Total assist;With upper extremity assist Sit to Stand: Patient Percentage: 80% Stand to Sit: 4: Min assist;To chair/3-in-1 Details for Transfer Assistance: Mod verbal cues for hand placement and technique. Pt. not putting weight through left LE         End of Session OT - End of Session Equipment Utilized During Treatment: Gait belt Activity Tolerance: Patient limited by pain Patient left: in chair;with call bell/phone within reach;with nursing in room Nurse Communication: Mobility status;Other (comment) (transfer technique)    Sofhia Ulibarri, OTR/L Pager [phone] 09/04/2011, 9:07 AM

## 2011-09-05 ENCOUNTER — Inpatient Hospital Stay (HOSPITAL_COMMUNITY): Payer: Medicare Other

## 2011-09-05 DIAGNOSIS — A0472 Enterocolitis due to Clostridium difficile, not specified as recurrent: Secondary | ICD-10-CM

## 2011-09-05 DIAGNOSIS — N186 End stage renal disease: Secondary | ICD-10-CM

## 2011-09-05 DIAGNOSIS — I70269 Atherosclerosis of native arteries of extremities with gangrene, unspecified extremity: Secondary | ICD-10-CM

## 2011-09-05 LAB — RENAL FUNCTION PANEL
BUN: 14 mg/dL (ref 6–23)
CO2: 27 mEq/L (ref 19–32)
Chloride: 100 mEq/L (ref 96–112)
Creatinine, Ser: 4.36 mg/dL — ABNORMAL HIGH (ref 0.50–1.35)
GFR calc non Af Amer: 13 mL/min — ABNORMAL LOW (ref >90)
Potassium: 3.6 mEq/L (ref 3.5–5.1)

## 2011-09-05 LAB — CBC
HCT: 27 % — ABNORMAL LOW (ref 39.0–52.0)
MCV: 89.1 fL (ref 78.0–100.0)
RBC: 3.03 MIL/uL — ABNORMAL LOW (ref 4.22–5.81)
WBC: 7.3 10*3/uL (ref 4.0–10.5)

## 2011-09-05 MED ORDER — OXYCODONE HCL 5 MG PO TABS
ORAL_TABLET | ORAL | Status: AC
  Administered 2011-09-05: 5 mg via ORAL
  Filled 2011-09-05: qty 1

## 2011-09-05 MED ORDER — DOXERCALCIFEROL 4 MCG/2ML IV SOLN
INTRAVENOUS | Status: AC
  Administered 2011-09-05: 6 ug via INTRAVENOUS
  Filled 2011-09-05: qty 4

## 2011-09-05 MED ORDER — SODIUM CHLORIDE 0.9 % IJ SOLN
10.0000 mL | Freq: Two times a day (BID) | INTRAMUSCULAR | Status: DC
  Administered 2011-09-05 – 2011-09-07 (×4): 10 mL
  Administered 2011-09-07: 3 mL
  Administered 2011-09-08: 10 mL
  Administered 2011-09-10: 20 mL
  Administered 2011-09-10 – 2011-09-11 (×2): 10 mL
  Administered 2011-09-11: 20 mL
  Administered 2011-09-12: 40 mL
  Administered 2011-09-12: 30 mL
  Administered 2011-09-13: 10 mL
  Filled 2011-09-05: qty 20
  Filled 2011-09-05: qty 30
  Filled 2011-09-05: qty 10
  Filled 2011-09-05: qty 30
  Filled 2011-09-05: qty 20

## 2011-09-05 MED ORDER — SODIUM CHLORIDE 0.9 % IJ SOLN
10.0000 mL | INTRAMUSCULAR | Status: DC | PRN
  Administered 2011-09-05: 10 mL
  Administered 2011-09-05: 20 mL
  Administered 2011-09-05 – 2011-09-08 (×3): 10 mL
  Administered 2011-09-09: 20 mL
  Administered 2011-09-14: 30 mL
  Administered 2011-09-14 – 2011-09-16 (×9): 10 mL
  Filled 2011-09-05: qty 20
  Filled 2011-09-05: qty 10
  Filled 2011-09-05: qty 20

## 2011-09-05 NOTE — Progress Notes (Signed)
  [facility] Progress Note    Subjective:   Reports to be feeling fair and tolerating HD without problems   Objective:   BP 95/43  Pulse 69  Temp 98.7 F (37.1 C) (Oral)  Resp 13  Ht 5\' 6"  (1.676 m)  Wt 71.6 kg (157 lb 13.6 oz)  BMI 25.48 kg/m2  SpO2 96%  Intake/Output Summary (Last 24 hours) at 09/05/11 0952 Last data filed at 09/05/11 0600  Gross per 24 hour  Intake 2710.33 ml  Output      0 ml  Net 2710.33 ml   Weight change: 0.5 kg (1 lb 1.6 oz)  Physical Exam: [id number] resting on HD XBJ:YNWGN RRR, normal s1 and S2  Resp:CTA bilaterally, No rales/rhonchi FAO:ZHYQ, flat, NT, BS normal Ext:No LE edema- ischemic left LE digits noted  Imaging: No results found.  Labs: BMET  Lab 09/05/11 0616 09/03/11 1011 09/01/11 0552 08/31/11 0442 08/30/11 0340  NA 136 137 139 141 140  K 3.6 4.2 3.7 3.6 3.6  CL 100 101 105 108 103  CO2 27 24 23 27 25   GLUCOSE 87 51* 80 91 81  BUN 14 20 18 10 18   CREATININE 4.36* 4.75* 4.30* 3.05* 4.67*  ALB -- -- -- -- --  CALCIUM 7.9* 8.1* 8.5 8.5 8.9  PHOS 3.9 4.1 -- -- 3.8   CBC  Lab 09/05/11 0616 09/03/11 1011 09/01/11 0552 08/31/11 0442  WBC 7.3 7.4 7.2 7.8  NEUTROABS -- -- -- --  HGB 8.5* 8.8* 8.0* 7.8*  HCT 27.0* 28.1* 26.0* 24.7*  MCV 89.1 90.1 90.3 89.5  PLT 190 159 129* 133*    Medications:      . amLODipine  10 mg Oral Daily  . antiseptic oral rinse  15 mL Mouth Rinse q12n4p  . chlorhexidine  15 mL Mouth Rinse BID  . cinacalcet  60 mg Oral Q breakfast  . darbepoetin (ARANESP) injection - DIALYSIS  100 mcg Intravenous Q Wed-HD  . doxercalciferol  6 mcg Intravenous Q M,W,F-HD  . famotidine  20 mg Oral QHS  . feeding supplement (NEPRO CARB STEADY)  237 mL Oral Q24H  . metoprolol tartrate  100 mg Oral BID  . metronidazole  500 mg Intravenous Q8H  . multivitamin  1 tablet Oral QHS  . vancomycin  125 mg Oral Q6H     Assessment/ Plan:   1. Sepsis Syndrome with C diff colitis/PNA: Improved  on antibiotic therapy with Primaxin, vancomycin and metronidazole.  2.C Diff colitis: Resolved on IV flagyl and PO vancomycin  3. 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 May need reconsult   Zetta Bills, MD 09/05/2011, 9:52 AM

## 2011-09-05 NOTE — Procedures (Signed)
Patient seen on Hemodialysis. QB 400, UF goal 3.5L Treatment adjusted as needed.  Zetta Bills MD [facility]. Office # [phone] Pager # [phone] 9:56 AM

## 2011-09-05 NOTE — Progress Notes (Signed)
Subjective: Patient states that his appetite has improved. He has had no further diarrhea. He has no complaints today.   Interval History: Vascular surgery plans left Fem-pop bypass early next week. Information noted from vascular surgery stating that the patient has been deemed by cardiology to be an acceptable risk for vascular surgery.  Objective: Filed Vitals:   09/05/11 0900 09/05/11 0930 09/05/11 0958 09/05/11 1410  BP: 117/48 95/43 137/74 126/64  Pulse: 66 69 64 77  Temp:   97.3 F (36.3 C) 98.4 F (36.9 C)  TempSrc:   Oral   Resp:   18 18  Height:      Weight:   69 kg (152 lb 1.9 oz)   SpO2:   92% 94%   Weight change: 0.5 kg (1 lb 1.6 oz)  Intake/Output Summary (Last 24 hours) at 09/05/11 1654 Last data filed at 09/05/11 0958  Gross per 24 hour  Intake    500 ml  Output   2700 ml  Net  -2200 ml    General: Alert, awake, oriented x3, in no acute distress.  HEENT: Cylinder/AT PEERL, EOMI Neck: Trachea midline,  no masses, no thyromegal,y no JVD, no carotid bruit OROPHARYNX:  Moist, No exudate/ erythema/lesions.  Heart: Regular rate and rhythm, without murmurs, rubs, gallops, PMI non-displaced, no heaves or thrills on palpation.  Lungs: Clear to auscultation, no wheezing or rhonchi noted. No increased vocal fremitus resonant to percussion  Abdomen: Soft, nontender, nondistended, positive bowel sounds, no masses no hepatosplenomegaly noted..  Neuro: No focal neurological deficits noted  Musculoskeletal: No warm swelling or erythema around joints, no spinal tenderness noted. Extremities: Left foot is cool to touch and there is no drainage from foot.   Lab Results:  [facility] 09/05/11 0616 09/03/11 1011  NA 136 137  K 3.6 4.2  CL 100 101  CO2 27 24  GLUCOSE 87 51*  BUN 14 20  CREATININE 4.36* 4.75*  CALCIUM 7.9* 8.1*  MG -- --  PHOS 3.9 4.1    Basename 09/05/11 0616 09/03/11 1011  AST -- --  ALT -- --  ALKPHOS -- --  BILITOT -- --  PROT -- --  ALBUMIN 1.5*  1.7*   No results found for this basename: LIPASE:2,AMYLASE:2 in the last 72 hours  Basename 09/05/11 0616 09/03/11 1011  WBC 7.3 7.4  NEUTROABS -- --  HGB 8.5* 8.8*  HCT 27.0* 28.1*  MCV 89.1 90.1  PLT 190 159   No results found for this basename: CKTOTAL:3,CKMB:3,CKMBINDEX:3,TROPONINI:3 in the last 72 hours No components found with this basename: POCBNP:3 No results found for this basename: DDIMER:2 in the last 72 hours No results found for this basename: HGBA1C:2 in the last 72 hours No results found for this basename: CHOL:2,HDL:2,LDLCALC:2,TRIG:2,CHOLHDL:2,LDLDIRECT:2 in the last 72 hours No results found for this basename: TSH,T4TOTAL,FREET3,T3FREE,THYROIDAB in the last 72 hours No results found for this basename: VITAMINB12:2,FOLATE:2,FERRITIN:2,TIBC:2,IRON:2,RETICCTPCT:2 in the last 72 hours  Micro Results: Recent Results (from the past 240 hour(s))  CULTURE, BLOOD (ROUTINE X 2)     Status: Normal   Collection Time   08/26/11 11:49 PM      Component Value Range Status Comment   Specimen Description BLOOD LEFT ARM   Final    Special Requests BOTTLES DRAWN AEROBIC ONLY [facility]   Final    Culture  Setup Time [id number]   Final    Culture NO GROWTH 5 DAYS   Final    Report Status 09/02/2011 FINAL   Final   CULTURE,  BLOOD (ROUTINE X 2)     Status: Normal   Collection Time   08/26/11 11:56 PM      Component Value Range Status Comment   Specimen Description BLOOD LEFT HAND   Final    Special Requests BOTTLES DRAWN AEROBIC ONLY [facility]   Final    Culture  Setup Time [id number]   Final    Culture NO GROWTH 5 DAYS   Final    Report Status 09/02/2011 FINAL   Final   MRSA PCR SCREENING     Status: Normal   Collection Time   08/27/11  2:55 AM      Component Value Range Status Comment   MRSA by PCR NEGATIVE  NEGATIVE Final   CLOSTRIDIUM DIFFICILE BY PCR     Status: Abnormal   Collection Time   08/27/11  8:54 PM      Component Value Range Status Comment   C difficile by pcr POSITIVE (*)  NEGATIVE Final     Studies/Results: Ct Abdomen Pelvis Wo Contrast  08/13/2011  *RADIOLOGY REPORT*  Clinical Data: Abdominal pain.  Unresponsive.  CT ABDOMEN AND PELVIS WITHOUT CONTRAST  Technique:  Multidetector CT imaging of the abdomen and pelvis was performed following the standard protocol without intravenous contrast.  Comparison: None.  Findings: Lack of intravenous and oral contrast severely limits the exam.  Moderate bilateral pleural effusions and bibasilar consolidation. Coronary artery calcifications.  Aortic valvular calcifications.  Gallbladder is decompressed.  Liver is within normal limits.  Small amount of free fluid anterior to the liver.  The stomach is markedly distended.  Small bowel loops are also markedly distended.  Distal small bowel is decompressed.  There is a small amount of enteric contrast in the lower abdomen at the midline likely contained within a bowel loop.  Colon is also relatively decompressed.  Small amount of free fluid is seen layering in the pelvis.  The kidneys have a markedly abnormal appearance bilaterally.  The left kidney is enlarged and markedly deformed with areas of curvilinear calcification and mixed density.  Similar changes in the right kidney but to a lesser degree.  Underlying renal mass cannot be excluded.  Dense vascular calcifications are noted.  No obvious free intraperitoneal gas.  Metallic staples are seen in the anterior abdominal wall to the left of midline in the lower abdomen.  Prominent degenerative changes of the hip joints there is a lytic area in  in the right pelvis, above the right acetabulum.  There is loss of cortex in the overlying bone.  There is also a lytic area in the posterior inferior right acetabulum on image 88.  These lytic areas are greater than one would expect with simple degenerative changes.  Aggressive process cannot be excluded.  Gas within venous structures near the right groin are likely iatrogenic.  IMPRESSION: Very  limited exam.  Bilateral pleural effusions and bibasilar consolidation.  Small bowel obstruction pattern.  The transition point is in the lower abdomen.  There are lytic lesions about the right acetabulum as described. An aggressive process such as metastatic disease or myeloma cannot be excluded.  MRI may be helpful.  Markedly abnormal appearance of the kidneys.  Renal mass cannot be excluded.  Original Report Authenticated By: Donavan Burnet, M.D.   Dg Chest 1 View  08/13/2011  *RADIOLOGY REPORT*  Clinical Data: Pneumonia  CHEST - 1 VIEW  Comparison: 08/13/2011  Findings: Diffuse airspace disease throughout the right lung. Small right pleural effusion.  Minimal linear atelectasis  at the left base.  Cardiomegaly.  Airspace disease on the left has improved.  IMPRESSION: Improved left airspace disease.  Stable airspace disease throughout the right lung with a right pleural effusion.  Original Report Authenticated By: Donavan Burnet, M.D.   Dg Abd 1 View  08/20/2011  *RADIOLOGY REPORT*  Clinical Data: Abdominal distention.  ABDOMEN - 1 VIEW  Comparison: Plain film the abdomen 08/16/2011 and CT abdomen and pelvis 08/13/2011.  Findings: There are some scattered gas filled but nondilated loops of small bowel.  Small amount of gas is seen in the ascending colon.  NG tube is no longer visualized.  Calcified cystic lesion in the left upper quadrant as seen on CT noted.  Atherosclerotic vascular disease is also noted.  IMPRESSION: Some increase in gas filled but nondilated loops of small bowel could be due to progressive small bowel obstruction.  Original Report Authenticated By: Bernadene Bell. Maricela Curet, M.D.   Ct Abdomen Pelvis W Contrast  08/27/2011  *RADIOLOGY REPORT*  Clinical Data: Abdominal pain.  Septic.  CT ABDOMEN AND PELVIS WITH CONTRAST  Technique:  Multidetector CT imaging of the abdomen and pelvis was performed following the standard protocol during bolus administration of intravenous contrast.  Contrast:  40mL OMNIPAQUE IOHEXOL 300 MG/ML  SOLN, OMNIPAQUE IOHEXOL 300 MG/ML  SOLN  Comparison: CT scan 08/13/2011.  Findings: The lung bases demonstrate bilateral pleural effusions and bilateral infiltrates.  The liver is unremarkable and stable.  No focal lesions or intrahepatic biliary dilatation.  Gallstones and gallbladder sludge are suspected.  The pancreas is grossly normal.  The spleen is normal in size.  No focal lesions.  The adrenal glands demonstrate mild nodular enlargement suggesting nodular hyperplasia.  The kidneys demonstrate multiple bilateral rim calcified cysts. The right kidney is small and scarred.  The stomach is distended with contrast.  No obvious abnormality. There is a cluster of small bowel loops in the right mid abdomen which demonstrate mild distention.  Findings suspicious for an early closed loop obstruction or possible compartmentalized small bowel. No pneumatosis or significant wall thickening.  There is enhancement of the mucosa and the vascular pedicle demonstrates enhancement.  The remaining small bowel loops are normal in caliber. The colon is grossly normal.  There is diffuse and fairly marked rectal wall thickening.  The aorta demonstrates advanced atherosclerotic changes.  No focal aneurysm or dissection.  The major branch vessels demonstrate severe atherosclerotic calcifications.  The bladder is grossly normal.  The prostate gland and seminal vesicles are unremarkable.  There is a small amount of free pelvic fluid.  Examination of the bony structures demonstrates stable lytic lesions.  IMPRESSION:  1.  Findings concerning for an early closed loop obstruction with compartmentalized small bowel loops.  Recommend surgical consultation. 2.  Diffuse and marked rectal wall thickening.  This could be infectious or inflammatory.  3.  Severe atherosclerotic changes involving the aorta and branch vessels. 4.  Bilateral infiltrates and effusions. 5.  Severe cystic kidney disease.   Original Report Authenticated By: P. Loralie Champagne, M.D.   Ir Pta Venous Left  08/18/2011  *RADIOLOGY REPORT*  Clinical history:End-stage renal disease and clotted left upper extremity cephalic vein fistula.  PROCEDURE(S): DIALYSIS  FISTULA DECLOT; CEPHALIC VEIN ANGIOPLASTY; ULTRASOUND GUIDANCE FOR VASCULAR ACCESS  Physician: Rachelle Hora. Henn, MD  Medications:Heparin 3000 units, TPA 2 mg  Fluoroscopy time: 22.5 minutes  Contrast: 100 ml Omnipaque 300  Procedure:Informed consent was obtained for a declot procedure. The left upper arm was prepped and  draped in a sterile fashion. Maximal barrier sterile technique was utilized including caps, mask, sterile gowns, sterile gloves, sterile drape, hand hygiene and skin antiseptic.  The skin was anesthetized with 1% lidocaine. The fistula was accessed using a 21 gauge needle towards the central venous system with ultrasound guidance.  Ultrasound images were obtained for documentation. Micropuncture catheter was placed. A 6-French vascular sheath was placed.  A Kumpe catheter was advanced in the central veins using a Glidewire.  Central venogram was performed.  The catheter was pulled back as TPA was infused within the cephalic vein thrombus.  Fluoroscopic images were saved for documentation. A Bentson wire was placed centrally and the cephalic vein was treated with the AngioJet thrombectomy device. The cephalic vein was also angioplastied with a 6 mm x 40 mm balloon.  Thrombus was treated again with the AngioJet device and the PTD thrombectomy device.  Using ultrasound guidance, a 21 gauge needle was directed into the vein towards the arterial anastomosis.  Micropuncture dilator set was placed and a 6 [place] vascular sheath was placed. Although there was already flow from the arterial system, a Fogarty balloon was pulled through the vein in order to dislodge thrombus. There was improved flow within the fistula after using the Fogarty balloon.  The central cephalic vein was  angioplastied with 6 mm x 40 mm balloon.  At this point, there was flow throughout the fistula.  However, the flow was only temporary.  The central cephalic vein and mid humeral cephalic vein required angioplasty multiple times in order to keep the cephalic vein patent.   At the end of the procedure, there was a pulse within the cephalic vein but poor outflow.  The vascular sheaths were removed with pursestring sutures.  Findings:The left cephalic vein fistula anastomosis is near the elbow.  The vein was patent near the arterial anastomosis but occluded in the mid and upper arm.  The central cephalic vein was also patent.  A large amount of the thrombus was removed using the AngioJet and mechanical thrombectomy device.  Eventually, there was some flow throughout the fistula after balloon angioplasty of a tight stenosis in the central cephalic vein and recurrent stenosis in the mid humeral cephalic vein. At the end of the procedure, there was still a large amount of thrombus within the aneurysmal segment of the mid humeral cephalic vein.  Partial recanalization of the upper left arm cephalic vein.  Impression:Declot of the left upper extremity fistula.  There is improved flow through the fistula but the outflow is tenuous and high risk to occlude again.  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  Discussed the declot procedure with Dr. Hyman Hopes.   Recommend trying the fistula for dialysis.  If the fistula occludes again, recommend a catheter and surgical consultation for a new access.  Original Report Authenticated By: Richarda Overlie, M.D.   Ir Fluoro Guide Cv Line Left  08/20/2011  *RADIOLOGY REPORT*  Clinical data: End-stage renal disease.  Occluded arm graft.  Needs access for hemodialysis.  TUNNELED HEMODIALYSIS CATHETER PLACEMENT WITH ULTRASOUND AND FLUOROSCOPIC GUIDANCE:  Comparison: None  Technique: The procedure, risks, benefits, and alternatives were explained to the patient.  Questions  regarding the procedure were encouraged and answered.  The patient understands and consents to the procedure. The patient was on adequate   antibiotic coverage, so no additional preprocedural antibiotics were administered. Ultrasound suggested occlusion of the right IJ vein at the thoracic inlet.    Patency of the  left IJ vein was confirmed with ultrasound with image documentation. An appropriate skin site was determined. Region was prepped using maximum barrier technique including cap and mask, sterile gown, sterile gloves, large sterile sheet, and Chlorhexidine   as cutaneous antisepsis. The region was infiltrated locally with 1% lidocaine. Under real-time ultrasound guidance, the left IJ vein was accessed with a 21 gauge micropuncture needle; the needle tip within the vein was confirmed with ultrasound image documentation.   Needle exchanged over the 018 guidewire for transitional dilator, which allowed advancement of a Benson wire into the IVC. Over this, an MPA catheter was advanced. A Hemosplit 23 hemodialysis catheter was tunneled from the right anterior chest wall approach to the left IJ dermatotomy site. The MPA catheter was exchanged over an Amplatz wire for serial vascular dilators which allow placement of a peel-away sheath, through which the catheter was advanced under intermittent fluoroscopy, positioned with its tips in the proximal and midright atrium. Spot chest radiograph confirms good catheter position. No pneumothorax. Catheter was flushed and primed per protocol. Catheter secured externally with O Prolene sutures. The left IJ   dermatotomy site was closed with Dermabond. No immediate complication.  IMPRESSION:  1. Technically successful placement of tunneled left IJ hemodialysis catheter with ultrasound and fluoroscopic guidance. Ready for routine use.  Access management: Remains approachable for percutaneous intervention as needed.  Original Report Authenticated By: Osa Craver, M.D.     Ir Av Dialysis Graft Declot  08/18/2011  *RADIOLOGY REPORT*  Clinical history:End-stage renal disease and clotted left upper extremity cephalic vein fistula.  PROCEDURE(S): DIALYSIS  FISTULA DECLOT; CEPHALIC VEIN ANGIOPLASTY; ULTRASOUND GUIDANCE FOR VASCULAR ACCESS  Physician: Rachelle Hora. Henn, MD  Medications:Heparin 3000 units, TPA 2 mg  Fluoroscopy time: 22.5 minutes  Contrast: 100 ml Omnipaque 300  Procedure:Informed consent was obtained for a declot procedure. The left upper arm was prepped and draped in a sterile fashion. Maximal barrier sterile technique was utilized including caps, mask, sterile gowns, sterile gloves, sterile drape, hand hygiene and skin antiseptic.  The skin was anesthetized with 1% lidocaine. The fistula was accessed using a 21 gauge needle towards the central venous system with ultrasound guidance.  Ultrasound images were obtained for documentation. Micropuncture catheter was placed. A 6-French vascular sheath was placed.  A Kumpe catheter was advanced in the central veins using a Glidewire.  Central venogram was performed.  The catheter was pulled back as TPA was infused within the cephalic vein thrombus.  Fluoroscopic images were saved for documentation. A Bentson wire was placed centrally and the cephalic vein was treated with the AngioJet thrombectomy device. The cephalic vein was also angioplastied with a 6 mm x 40 mm balloon.  Thrombus was treated again with the AngioJet device and the PTD thrombectomy device.  Using ultrasound guidance, a 21 gauge needle was directed into the vein towards the arterial anastomosis.  Micropuncture dilator set was placed and a 6 [place] vascular sheath was placed. Although there was already flow from the arterial system, a Fogarty balloon was pulled through the vein in order to dislodge thrombus. There was improved flow within the fistula after using the Fogarty balloon.  The central cephalic vein was angioplastied with 6 mm x 40 mm balloon.  At this  point, there was flow throughout the fistula.  However, the flow was only temporary.  The central cephalic vein and mid humeral cephalic vein required angioplasty multiple times in order to keep the cephalic vein patent.   At the end  of the procedure, there was a pulse within the cephalic vein but poor outflow.  The vascular sheaths were removed with pursestring sutures.  Findings:The left cephalic vein fistula anastomosis is near the elbow.  The vein was patent near the arterial anastomosis but occluded in the mid and upper arm.  The central cephalic vein was also patent.  A large amount of the thrombus was removed using the AngioJet and mechanical thrombectomy device.  Eventually, there was some flow throughout the fistula after balloon angioplasty of a tight stenosis in the central cephalic vein and recurrent stenosis in the mid humeral cephalic vein. At the end of the procedure, there was still a large amount of thrombus within the aneurysmal segment of the mid humeral cephalic vein.  Partial recanalization of the upper left arm cephalic vein.  Impression:Declot of the left upper extremity fistula.  There is improved flow through the fistula but the outflow is tenuous and high risk to occlude again.  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  Discussed the declot procedure with Dr. Hyman Hopes.   Recommend trying the fistula for dialysis.  If the fistula occludes again, recommend a catheter and surgical consultation for a new access.  Original Report Authenticated By: Richarda Overlie, M.D.   Ir Angio Av Shunt Addl Access  08/18/2011  *RADIOLOGY REPORT*  Clinical history:End-stage renal disease and clotted left upper extremity cephalic vein fistula.  PROCEDURE(S): DIALYSIS  FISTULA DECLOT; CEPHALIC VEIN ANGIOPLASTY; ULTRASOUND GUIDANCE FOR VASCULAR ACCESS  Physician: Rachelle Hora. Henn, MD  Medications:Heparin 3000 units, TPA 2 mg  Fluoroscopy time: 22.5 minutes  Contrast: 100 ml Omnipaque 300   Procedure:Informed consent was obtained for a declot procedure. The left upper arm was prepped and draped in a sterile fashion. Maximal barrier sterile technique was utilized including caps, mask, sterile gowns, sterile gloves, sterile drape, hand hygiene and skin antiseptic.  The skin was anesthetized with 1% lidocaine. The fistula was accessed using a 21 gauge needle towards the central venous system with ultrasound guidance.  Ultrasound images were obtained for documentation. Micropuncture catheter was placed. A 6-French vascular sheath was placed.  A Kumpe catheter was advanced in the central veins using a Glidewire.  Central venogram was performed.  The catheter was pulled back as TPA was infused within the cephalic vein thrombus.  Fluoroscopic images were saved for documentation. A Bentson wire was placed centrally and the cephalic vein was treated with the AngioJet thrombectomy device. The cephalic vein was also angioplastied with a 6 mm x 40 mm balloon.  Thrombus was treated again with the AngioJet device and the PTD thrombectomy device.  Using ultrasound guidance, a 21 gauge needle was directed into the vein towards the arterial anastomosis.  Micropuncture dilator set was placed and a 6 [place] vascular sheath was placed. Although there was already flow from the arterial system, a Fogarty balloon was pulled through the vein in order to dislodge thrombus. There was improved flow within the fistula after using the Fogarty balloon.  The central cephalic vein was angioplastied with 6 mm x 40 mm balloon.  At this point, there was flow throughout the fistula.  However, the flow was only temporary.  The central cephalic vein and mid humeral cephalic vein required angioplasty multiple times in order to keep the cephalic vein patent.   At the end of the procedure, there was a pulse within the cephalic vein but poor outflow.  The vascular sheaths were removed with pursestring sutures.  Findings:The left cephalic  vein  fistula anastomosis is near the elbow.  The vein was patent near the arterial anastomosis but occluded in the mid and upper arm.  The central cephalic vein was also patent.  A large amount of the thrombus was removed using the AngioJet and mechanical thrombectomy device.  Eventually, there was some flow throughout the fistula after balloon angioplasty of a tight stenosis in the central cephalic vein and recurrent stenosis in the mid humeral cephalic vein. At the end of the procedure, there was still a large amount of thrombus within the aneurysmal segment of the mid humeral cephalic vein.  Partial recanalization of the upper left arm cephalic vein.  Impression:Declot of the left upper extremity fistula.  There is improved flow through the fistula but the outflow is tenuous and high risk to occlude again.  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  Discussed the declot procedure with Dr. Hyman Hopes.   Recommend trying the fistula for dialysis.  If the fistula occludes again, recommend a catheter and surgical consultation for a new access.  Original Report Authenticated By: Richarda Overlie, M.D.   Ir US Guide Vasc Access Left  08/20/2011  *RADIOLOGY REPORT*  Clinical data: End-stage renal disease.  Occluded arm graft.  Needs access for hemodialysis.  TUNNELED HEMODIALYSIS CATHETER PLACEMENT WITH ULTRASOUND AND FLUOROSCOPIC GUIDANCE:  Comparison: None  Technique: The procedure, risks, benefits, and alternatives were explained to the patient.  Questions regarding the procedure were encouraged and answered.  The patient understands and consents to the procedure. The patient was on adequate   antibiotic coverage, so no additional preprocedural antibiotics were administered. Ultrasound suggested occlusion of the right IJ vein at the thoracic inlet.    Patency of the left IJ vein was confirmed with ultrasound with image documentation. An appropriate skin site was determined. Region was prepped using  maximum barrier technique including cap and mask, sterile gown, sterile gloves, large sterile sheet, and Chlorhexidine   as cutaneous antisepsis. The region was infiltrated locally with 1% lidocaine. Under real-time ultrasound guidance, the left IJ vein was accessed with a 21 gauge micropuncture needle; the needle tip within the vein was confirmed with ultrasound image documentation.   Needle exchanged over the 018 guidewire for transitional dilator, which allowed advancement of a Benson wire into the IVC. Over this, an MPA catheter was advanced. A Hemosplit 23 hemodialysis catheter was tunneled from the right anterior chest wall approach to the left IJ dermatotomy site. The MPA catheter was exchanged over an Amplatz wire for serial vascular dilators which allow placement of a peel-away sheath, through which the catheter was advanced under intermittent fluoroscopy, positioned with its tips in the proximal and midright atrium. Spot chest radiograph confirms good catheter position. No pneumothorax. Catheter was flushed and primed per protocol. Catheter secured externally with O Prolene sutures. The left IJ   dermatotomy site was closed with Dermabond. No immediate complication.  IMPRESSION:  1. Technically successful placement of tunneled left IJ hemodialysis catheter with ultrasound and fluoroscopic guidance. Ready for routine use.  Access management: Remains approachable for percutaneous intervention as needed.  Original Report Authenticated By: Osa Craver, M.D.   Ir US Guide Vasc Access Left  08/18/2011  *RADIOLOGY REPORT*  Clinical history:End-stage renal disease and clotted left upper extremity cephalic vein fistula.  PROCEDURE(S): DIALYSIS  FISTULA DECLOT; CEPHALIC VEIN ANGIOPLASTY; ULTRASOUND GUIDANCE FOR VASCULAR ACCESS  Physician: Rachelle Hora. Henn, MD  Medications:Heparin 3000 units, TPA 2 mg  Fluoroscopy time: 22.5 minutes  Contrast: 100 ml Omnipaque 300  Procedure:Informed consent was obtained for  a declot procedure. The left upper arm was prepped and draped in a sterile fashion. Maximal barrier sterile technique was utilized including caps, mask, sterile gowns, sterile gloves, sterile drape, hand hygiene and skin antiseptic.  The skin was anesthetized with 1% lidocaine. The fistula was accessed using a 21 gauge needle towards the central venous system with ultrasound guidance.  Ultrasound images were obtained for documentation. Micropuncture catheter was placed. A 6-French vascular sheath was placed.  A Kumpe catheter was advanced in the central veins using a Glidewire.  Central venogram was performed.  The catheter was pulled back as TPA was infused within the cephalic vein thrombus.  Fluoroscopic images were saved for documentation. A Bentson wire was placed centrally and the cephalic vein was treated with the AngioJet thrombectomy device. The cephalic vein was also angioplastied with a 6 mm x 40 mm balloon.  Thrombus was treated again with the AngioJet device and the PTD thrombectomy device.  Using ultrasound guidance, a 21 gauge needle was directed into the vein towards the arterial anastomosis.  Micropuncture dilator set was placed and a 6 [place] vascular sheath was placed. Although there was already flow from the arterial system, a Fogarty balloon was pulled through the vein in order to dislodge thrombus. There was improved flow within the fistula after using the Fogarty balloon.  The central cephalic vein was angioplastied with 6 mm x 40 mm balloon.  At this point, there was flow throughout the fistula.  However, the flow was only temporary.  The central cephalic vein and mid humeral cephalic vein required angioplasty multiple times in order to keep the cephalic vein patent.   At the end of the procedure, there was a pulse within the cephalic vein but poor outflow.  The vascular sheaths were removed with pursestring sutures.  Findings:The left cephalic vein fistula anastomosis is near the elbow.  The  vein was patent near the arterial anastomosis but occluded in the mid and upper arm.  The central cephalic vein was also patent.  A large amount of the thrombus was removed using the AngioJet and mechanical thrombectomy device.  Eventually, there was some flow throughout the fistula after balloon angioplasty of a tight stenosis in the central cephalic vein and recurrent stenosis in the mid humeral cephalic vein. At the end of the procedure, there was still a large amount of thrombus within the aneurysmal segment of the mid humeral cephalic vein.  Partial recanalization of the upper left arm cephalic vein.  Impression:Declot of the left upper extremity fistula.  There is improved flow through the fistula but the outflow is tenuous and high risk to occlude again.  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  Discussed the declot procedure with Dr. Hyman Hopes.   Recommend trying the fistula for dialysis.  If the fistula occludes again, recommend a catheter and surgical consultation for a new access.  Original Report Authenticated By: Richarda Overlie, M.D.   Dg Chest Port 1 View  08/30/2011  *RADIOLOGY REPORT*  Clinical Data: Check endotracheal tube placement.  Pneumonia.  PORTABLE CHEST - 1 VIEW  Comparison: Portable chest 08/28/2011.  Findings: The patient has been extubated.  The NG tube was removed as well.  A left IJ dialysis catheter is stable.  There is slight improved aeration of the left lung.  Right-sided airspace disease is not significantly changed.  Bilateral pleural effusions are evident, right greater than left.  Mild  cardiomegaly is stable. Pancreatic calcifications are again noted.  IMPRESSION:  1.  Slight improved aeration of the left lung. 2.  Right-sided airspace disease is stable. 3.  Slight increase in bilateral pleural effusions, right greater than left. 4.  The patient has been extubated.  The NG tube was removed as well. 5.  The appearance remains concerning for bilateral  pneumonia.  Original Report Authenticated By: Jamesetta Orleans. MATTERN, M.D.   Dg Chest Port 1 View  08/28/2011  *RADIOLOGY REPORT*  Clinical Data: Intubated patient.  PORTABLE CHEST - 1 VIEW  Comparison: Portable chest 08/27/2011.  Findings: Support tubes and lines are unchanged.  Extensive bilateral airspace disease persists.  Aeration in the right chest has worsened.  Right greater than left pleural effusions noted.  IMPRESSION: Extensive bilateral airspace disease shows worsening on the right.  Original Report Authenticated By: Bernadene Bell. D'ALESSIO, M.D.   Dg Chest Portable 1 View  08/27/2011  *RADIOLOGY REPORT*  Clinical Data: Line placement.  PORTABLE CHEST - 1 VIEW  Comparison: 08/26/2011.  Findings: The support apparatus is in good position.  The endotracheal tube is now 4.6 cm above the carina.  Interval progressive airspace consolidation in the left lung could reflect worsening edema or infiltrate.  Small effusions are stable.  IMPRESSION:  1.  Support apparatus in good position without complicating features. 2.  Worsening airspace consolidation, left greater than right could reflect worsening edema or infiltrate. 3.  Stable small effusions.  Original Report Authenticated By: P. Loralie Champagne, M.D.   Dg Chest Port 1 View  08/26/2011  *RADIOLOGY REPORT*  Clinical Data: Endotracheal tube placement.  PORTABLE CHEST - 1 VIEW  Comparison: 08/16/2011.  Findings: The endotracheal tube is right at the right mainstem bronchus and should be retracted 3 cm.  The dialysis catheter is stable.  The NG tube is in the stomach.  There is pulmonary edema, areas of atelectasis and a right pleural effusion.  IMPRESSION: The endotracheal tube is at the right mainstem bronchus and should be retracted 3 cm.  Original Report Authenticated By: P. Loralie Champagne, M.D.   Dg Chest Port 1 View  08/16/2011  *RADIOLOGY REPORT*  Clinical Data: Pneumonia.  Pleural effusion.  PORTABLE CHEST - 1 VIEW  Comparison: 08/15/2011   Findings: Nasogastric tube extends into the stomach.  Cardiomegaly noted with mild interstitial accentuation and increased obscuration of the right hemidiaphragm primarily ascribed to pleural effusion and associated passive atelectasis.  There is mild atelectasis at the left lung base along with a small left pleural effusion.  Lung volumes are slightly worsened compared to yesterday's exam.  Atherosclerotic calcification of the aortic arch noted. Glenohumeral degenerative findings noted.  IMPRESSION:  1.  Increase in apparent size of the right pleural effusion with associated passive atelectasis. 2.  Small left pleural effusion with left basilar atelectasis also noted. 3.  Cardiomegaly with minimal interstitial accentuation.  Original Report Authenticated By: Dellia Cloud, M.D.   Dg Chest Port 1 View  08/15/2011  *RADIOLOGY REPORT*  Clinical Data: Status post right thoracentesis.  PORTABLE CHEST - 1 VIEW  Comparison: Earlier the same day  Findings: 1556 hours.  Right pleural effusion has decreased in the interval.  No evidence for pneumothorax.  There is some subsegmental atelectasis at the right base. The NG tube passes into the stomach although the distal tip position is not included on the film. The cardiopericardial silhouette is enlarged.  Diffuse interstitial opacity is improved in the interval, suggesting resolving edema.  IMPRESSION: No evidence  for pneumothorax after right thoracentesis.  Original Report Authenticated By: ERIC A. MANSELL, M.D.   Dg Chest Port 1 View  08/15/2011  *RADIOLOGY REPORT*  Clinical Data: Shortness of breath and pleural effusion.  PORTABLE CHEST - 1 VIEW  Comparison: 08/14/2011  Findings: The NG tube passes into the stomach although the distal tip position is not included on the film. The cardiopericardial silhouette is enlarged.  Vascular congestion with diffuse interstitial opacity is consistent with edema.  There is bibasilar atelectasis and right pleural effusion.   IMPRESSION: No substantial change.  Cardiomegaly with edema and right pleural effusion.  Original Report Authenticated By: ERIC A. MANSELL, M.D.   Portable Chest Xray In Am  08/14/2011  *RADIOLOGY REPORT*  Clinical Data: Respiratory failure  PORTABLE CHEST - 1 VIEW  Comparison: Aug 13, 2011  Findings: There is cardiomegaly and pulmonary edema, as well as a moderate sized right pleural effusion.  Overall, the findings  have not significantly changed.  The nasogastric tube tip courses off the inferior film.  IMPRESSION: No significant change in cardiomegaly, pulmonary edema, moderate sized right pleural effusion.  Original Report Authenticated By: Brandon Melnick, M.D.   Dg Chest Port 1 View  08/13/2011  *RADIOLOGY REPORT*  Clinical Data: Unresponsive.  Shortness of breath and abdominal distention.  PORTABLE CHEST - 1 VIEW  Comparison: 07/31/2011.  Findings: Trachea is midline.  Heart size stable.  There is bilateral air space disease, right greater than left, with progression from 07/31/2011.  Associated right pleural effusion. There may be a tiny left pleural effusion.  IMPRESSION: Worsening bilateral air space disease with an associated right pleural effusion.  Question tiny left pleural effusion.  Congestive heart failure is likely.  Original Report Authenticated By: Reyes Ivan, M.D.   Dg Abd Portable 1v  08/16/2011  *RADIOLOGY REPORT*  Clinical Data: Small bowel obstruction  PORTABLE ABDOMEN - 1 VIEW  Comparison: 08/15/2011  Findings: Vascular calcifications and rim calcified left renal lesions noted.  Nasogastric tube extends into the stomach.  The right femoral line projects over the pelvis.  Left pelvic skin clips noted.  Currently most of the bowel is gasless.  The visualized loops of bowel do not demonstrate bowel dilatation.  Lumbar spondylosis is again noted. Pleural effusions noted at the lung bases, with cardiomegaly.  IMPRESSION:  1.  No dilated bowel is currently visible, although much  of the bowel is gasless. 2.  Small right and smaller left pleural effusions. 3.  Calcified left renal cysts or masses. 4.  Vascular calcifications. 5.  Right femoral line tip projects over the right sacral ala.  Original Report Authenticated By: Dellia Cloud, M.D.   Dg Abd Portable 1v  08/15/2011  *RADIOLOGY REPORT*  Clinical Data: Small bowel obstruction.  PORTABLE ABDOMEN - 1 VIEW  Comparison: Plain films and CT abdomen and pelvis 08/13/2011.  Findings: New NG tube and right femoral catheter are in place. Surgical staples to the left of midline again noted.  Gas filled small bowel loops are again seen but appear less numerous and prominent.  Bilateral upper quadrant calcifications are noted and consistent with renal calcifications seen on CT.  IMPRESSION: Mild improvement small bowel obstruction.  Original Report Authenticated By: Bernadene Bell. Maricela Curet, M.D.   Dg Abd Portable 1v  08/13/2011  *RADIOLOGY REPORT*  Clinical Data: Unresponsive.  Shortness of breath and abdominal distention.  PORTABLE ABDOMEN - 1 VIEW  Comparison: None.  Findings: There is mild gaseous distention of small bowel loops. Some colonic  gas and stool are seen.  Surgical skin staples overlie the left lower quadrant.  Peripherally calcified structure in the left paraspinal region has the shape of the left kidney.  Additional tubular calcifications are seen in the left upper quadrant.  There may be small calcifications in the right paraspinal region.  IMPRESSION:  1.  Mild small bowel distention with possible wall thickening.  CT abdomen pelvis with contrast would likely be helpful in further evaluation, as clinically indicated. 2.  Calcified structures in the left upper quadrant, and likely right paraspinal region, as discussed above.  These could also be further assessed with CT.  Original Report Authenticated By: Reyes Ivan, M.D.   Dg Abd Portable 2v  08/28/2011  *RADIOLOGY REPORT*  Clinical Data: Abdominal distention.   Question ileus or small bowel obstruction.  PORTABLE ABDOMEN - 2 VIEW  Comparison: CT abdomen pelvis 08/27/2011 and plain film of the abdomen 08/20/2011.  Findings: The abdomen is nearly gasless.  NG tube and right groin catheter are in place.  Calcified cystic lesion in the left upper quadrant as seen on CT scan again noted.  Multiple calcifications in the spleen are also again seen.  IMPRESSION: The abdomen is nearly gasless making assessment of small bowel difficult.  No definite acute finding.  Original Report Authenticated By: Bernadene Bell. Maricela Curet, M.D.    Medications: I have reviewed the patient's current medications. Scheduled Meds:    . amLODipine  10 mg Oral Daily  . antiseptic oral rinse  15 mL Mouth Rinse q12n4p  . chlorhexidine  15 mL Mouth Rinse BID  . cinacalcet  60 mg Oral Q breakfast  . darbepoetin (ARANESP) injection - DIALYSIS  100 mcg Intravenous Q Wed-HD  . doxercalciferol  6 mcg Intravenous Q M,W,F-HD  . famotidine  20 mg Oral QHS  . feeding supplement (NEPRO CARB STEADY)  237 mL Oral Q24H  . metoprolol tartrate  100 mg Oral BID  . metronidazole  500 mg Intravenous Q8H  . multivitamin  1 tablet Oral QHS  . sodium chloride  10-40 mL Intracatheter Q12H  . vancomycin  125 mg Oral Q6H   Continuous Infusions:  PRN Meds:.sodium chloride, heparin, heparin, morphine injection, oxyCODONE, sodium chloride Assessment/Plan: Patient Active Hospital Problem List: Septic shock (08/27/2011)   Assessment: Septic shock was associated with fulminant C. difficile infection and hospital-acquired pneumonia. The patient is fully recovered from his septic shock is no hemodynamically stable.diarrhea.    Chronic diastolic CHF (congestive heart failure) (07/23/2010)   Assessment: Clinically well compensated     ESRD (end stage renal disease) ()   Assessment: Hemodialysis dependent and tolerating well by mouth     C. difficile colitis (08/14/2011)   Assessment:  The patient had 5 days of IV  Flagyl and  6 days of by mouth vancomycin . Based on clinical progression, Flagyl has ben discontinued and patient continues to improve.  Will complete a total of 14 days of Vancomycin.  CAD (coronary artery disease) (08/14/2011)   Assessment: No chest pain. Information noted regarding cardiology evaluation.    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    LOS: 10 days

## 2011-09-05 NOTE — Progress Notes (Signed)
[facility] progress Note:  Pt had pre-op card cath and pre-op clearance by cardiology per Dr. Estanislado Spire note on 08/04/11:  He is undergone cardiac angiography and deemed to be acceptable risk for bypass surgery. The patient has had no changes in his symptoms.  Dr. Edilia Bo also contacted [facility] on 09/02/11 and they had told him that [name] was cleared for surgery.   Sharlon Pfohl J 12:48 PM 09/05/2011

## 2011-09-06 DIAGNOSIS — I70269 Atherosclerosis of native arteries of extremities with gangrene, unspecified extremity: Secondary | ICD-10-CM

## 2011-09-06 DIAGNOSIS — N186 End stage renal disease: Secondary | ICD-10-CM

## 2011-09-06 DIAGNOSIS — A0472 Enterocolitis due to Clostridium difficile, not specified as recurrent: Secondary | ICD-10-CM

## 2011-09-06 NOTE — Progress Notes (Signed)
Subjective: Patient no complaints today. He's had no further diarrhea.   Interval History: Vascular surgery plans left Fem-pop bypass early next week. Information noted from vascular surgery stating that the patient has been deemed by cardiology to be an acceptable risk for vascular surgery.  Objective: Filed Vitals:   09/05/11 2059 09/06/11 0444 09/06/11 1000 09/06/11 1528  BP: 149/72 131/59 156/67 141/75  Pulse: 67 63 67 60  Temp: 98.1 F (36.7 C) 97.7 F (36.5 C) 98.2 F (36.8 C) 97.3 F (36.3 C)  TempSrc: Oral Oral Oral Oral  Resp: 18 18 20 16   Height: 5\' 6"  (1.676 m)     Weight: 68.9 kg (151 lb 14.4 oz)     SpO2: 95% 98% 95% 96%   Weight change: -3.4 kg (-7 lb 7.9 oz)  Intake/Output Summary (Last 24 hours) at 09/06/11 1601 Last data filed at 09/06/11 1035  Gross per 24 hour  Intake    420 ml  Output      0 ml  Net    420 ml    General: Alert, awake, oriented x3, in no acute distress.  HEENT: [place]/AT PEERL, EOMI Neck: Trachea midline,  no masses, no thyromegal,y no JVD, no carotid bruit OROPHARYNX:  Moist, No exudate/ erythema/lesions.  Heart: Regular rate and rhythm, without murmurs, rubs, gallops, PMI non-displaced, no heaves or thrills on palpation.  Lungs: Clear to auscultation, no wheezing or rhonchi noted. No increased vocal fremitus resonant to percussion  Abdomen: Soft, nontender, nondistended, positive bowel sounds, no masses no hepatosplenomegaly noted..  Neuro: No focal neurological deficits noted  Musculoskeletal: No warm swelling or erythema around joints, no spinal tenderness noted. Extremities: Left foot is cool to touch and there is no drainage from foot.   Lab Results:  [facility] 09/05/11 0616  NA 136  K 3.6  CL 100  CO2 27  GLUCOSE 87  BUN 14  CREATININE 4.36*  CALCIUM 7.9*  MG --  PHOS 3.9    Basename 09/05/11 0616  AST --  ALT --  ALKPHOS --  BILITOT --  PROT --  ALBUMIN 1.5*   No results found for this basename:  LIPASE:2,AMYLASE:2 in the last 72 hours  Basename 09/05/11 0616  WBC 7.3  NEUTROABS --  HGB 8.5*  HCT 27.0*  MCV 89.1  PLT 190   No results found for this basename: CKTOTAL:3,CKMB:3,CKMBINDEX:3,TROPONINI:3 in the last 72 hours No components found with this basename: POCBNP:3 No results found for this basename: DDIMER:2 in the last 72 hours No results found for this basename: HGBA1C:2 in the last 72 hours No results found for this basename: CHOL:2,HDL:2,LDLCALC:2,TRIG:2,CHOLHDL:2,LDLDIRECT:2 in the last 72 hours No results found for this basename: TSH,T4TOTAL,FREET3,T3FREE,THYROIDAB in the last 72 hours No results found for this basename: VITAMINB12:2,FOLATE:2,FERRITIN:2,TIBC:2,IRON:2,RETICCTPCT:2 in the last 72 hours  Micro Results: Recent Results (from the past 240 hour(s))  CLOSTRIDIUM DIFFICILE BY PCR     Status: Abnormal   Collection Time   08/27/11  8:54 PM      Component Value Range Status Comment   C difficile by pcr POSITIVE (*) NEGATIVE Final     Studies/Results: Ct Abdomen Pelvis Wo Contrast  08/13/2011  *RADIOLOGY REPORT*  Clinical Data: Abdominal pain.  Unresponsive.  CT ABDOMEN AND PELVIS WITHOUT CONTRAST  Technique:  Multidetector CT imaging of the abdomen and pelvis was performed following the standard protocol without intravenous contrast.  Comparison: None.  Findings: Lack of intravenous and oral contrast severely limits the exam.  Moderate bilateral pleural effusions and bibasilar  consolidation. Coronary artery calcifications.  Aortic valvular calcifications.  Gallbladder is decompressed.  Liver is within normal limits.  Small amount of free fluid anterior to the liver.  The stomach is markedly distended.  Small bowel loops are also markedly distended.  Distal small bowel is decompressed.  There is a small amount of enteric contrast in the lower abdomen at the midline likely contained within a bowel loop.  Colon is also relatively decompressed.  Small amount of free fluid  is seen layering in the pelvis.  The kidneys have a markedly abnormal appearance bilaterally.  The left kidney is enlarged and markedly deformed with areas of curvilinear calcification and mixed density.  Similar changes in the right kidney but to a lesser degree.  Underlying renal mass cannot be excluded.  Dense vascular calcifications are noted.  No obvious free intraperitoneal gas.  Metallic staples are seen in the anterior abdominal wall to the left of midline in the lower abdomen.  Prominent degenerative changes of the hip joints there is a lytic area in  in the right pelvis, above the right acetabulum.  There is loss of cortex in the overlying bone.  There is also a lytic area in the posterior inferior right acetabulum on image 88.  These lytic areas are greater than one would expect with simple degenerative changes.  Aggressive process cannot be excluded.  Gas within venous structures near the right groin are likely iatrogenic.  IMPRESSION: Very limited exam.  Bilateral pleural effusions and bibasilar consolidation.  Small bowel obstruction pattern.  The transition point is in the lower abdomen.  There are lytic lesions about the right acetabulum as described. An aggressive process such as metastatic disease or myeloma cannot be excluded.  MRI may be helpful.  Markedly abnormal appearance of the kidneys.  Renal mass cannot be excluded.  Original Report Authenticated By: Donavan Burnet, M.D.   Dg Chest 1 View  08/13/2011  *RADIOLOGY REPORT*  Clinical Data: Pneumonia  CHEST - 1 VIEW  Comparison: 08/13/2011  Findings: Diffuse airspace disease throughout the right lung. Small right pleural effusion.  Minimal linear atelectasis at the left base.  Cardiomegaly.  Airspace disease on the left has improved.  IMPRESSION: Improved left airspace disease.  Stable airspace disease throughout the right lung with a right pleural effusion.  Original Report Authenticated By: Donavan Burnet, M.D.   Dg Abd 1  View  08/20/2011  *RADIOLOGY REPORT*  Clinical Data: Abdominal distention.  ABDOMEN - 1 VIEW  Comparison: Plain film the abdomen 08/16/2011 and CT abdomen and pelvis 08/13/2011.  Findings: There are some scattered gas filled but nondilated loops of small bowel.  Small amount of gas is seen in the ascending colon.  NG tube is no longer visualized.  Calcified cystic lesion in the left upper quadrant as seen on CT noted.  Atherosclerotic vascular disease is also noted.  IMPRESSION: Some increase in gas filled but nondilated loops of small bowel could be due to progressive small bowel obstruction.  Original Report Authenticated By: Bernadene Bell. Maricela Curet, M.D.   Ct Abdomen Pelvis W Contrast  08/27/2011  *RADIOLOGY REPORT*  Clinical Data: Abdominal pain.  Septic.  CT ABDOMEN AND PELVIS WITH CONTRAST  Technique:  Multidetector CT imaging of the abdomen and pelvis was performed following the standard protocol during bolus administration of intravenous contrast.  Contrast: 40mL OMNIPAQUE IOHEXOL 300 MG/ML  SOLN, OMNIPAQUE IOHEXOL 300 MG/ML  SOLN  Comparison: CT scan 08/13/2011.  Findings: The lung bases demonstrate bilateral pleural  effusions and bilateral infiltrates.  The liver is unremarkable and stable.  No focal lesions or intrahepatic biliary dilatation.  Gallstones and gallbladder sludge are suspected.  The pancreas is grossly normal.  The spleen is normal in size.  No focal lesions.  The adrenal glands demonstrate mild nodular enlargement suggesting nodular hyperplasia.  The kidneys demonstrate multiple bilateral rim calcified cysts. The right kidney is small and scarred.  The stomach is distended with contrast.  No obvious abnormality. There is a cluster of small bowel loops in the right mid abdomen which demonstrate mild distention.  Findings suspicious for an early closed loop obstruction or possible compartmentalized small bowel. No pneumatosis or significant wall thickening.  There is enhancement of the  mucosa and the vascular pedicle demonstrates enhancement.  The remaining small bowel loops are normal in caliber. The colon is grossly normal.  There is diffuse and fairly marked rectal wall thickening.  The aorta demonstrates advanced atherosclerotic changes.  No focal aneurysm or dissection.  The major branch vessels demonstrate severe atherosclerotic calcifications.  The bladder is grossly normal.  The prostate gland and seminal vesicles are unremarkable.  There is a small amount of free pelvic fluid.  Examination of the bony structures demonstrates stable lytic lesions.  IMPRESSION:  1.  Findings concerning for an early closed loop obstruction with compartmentalized small bowel loops.  Recommend surgical consultation. 2.  Diffuse and marked rectal wall thickening.  This could be infectious or inflammatory.  3.  Severe atherosclerotic changes involving the aorta and branch vessels. 4.  Bilateral infiltrates and effusions. 5.  Severe cystic kidney disease.  Original Report Authenticated By: P. Loralie Champagne, M.D.   Ir Pta Venous Left  08/18/2011  *RADIOLOGY REPORT*  Clinical history:End-stage renal disease and clotted left upper extremity cephalic vein fistula.  PROCEDURE(S): DIALYSIS  FISTULA DECLOT; CEPHALIC VEIN ANGIOPLASTY; ULTRASOUND GUIDANCE FOR VASCULAR ACCESS  Physician: Rachelle Hora. Henn, MD  Medications:Heparin 3000 units, TPA 2 mg  Fluoroscopy time: 22.5 minutes  Contrast: 100 ml Omnipaque 300  Procedure:Informed consent was obtained for a declot procedure. The left upper arm was prepped and draped in a sterile fashion. Maximal barrier sterile technique was utilized including caps, mask, sterile gowns, sterile gloves, sterile drape, hand hygiene and skin antiseptic.  The skin was anesthetized with 1% lidocaine. The fistula was accessed using a 21 gauge needle towards the central venous system with ultrasound guidance.  Ultrasound images were obtained for documentation. Micropuncture catheter was placed.  A 6-French vascular sheath was placed.  A Kumpe catheter was advanced in the central veins using a Glidewire.  Central venogram was performed.  The catheter was pulled back as TPA was infused within the cephalic vein thrombus.  Fluoroscopic images were saved for documentation. A Bentson wire was placed centrally and the cephalic vein was treated with the AngioJet thrombectomy device. The cephalic vein was also angioplastied with a 6 mm x 40 mm balloon.  Thrombus was treated again with the AngioJet device and the PTD thrombectomy device.  Using ultrasound guidance, a 21 gauge needle was directed into the vein towards the arterial anastomosis.  Micropuncture dilator set was placed and a 6 [place] vascular sheath was placed. Although there was already flow from the arterial system, a Fogarty balloon was pulled through the vein in order to dislodge thrombus. There was improved flow within the fistula after using the Fogarty balloon.  The central cephalic vein was angioplastied with 6 mm x 40 mm balloon.  At this point, there was  flow throughout the fistula.  However, the flow was only temporary.  The central cephalic vein and mid humeral cephalic vein required angioplasty multiple times in order to keep the cephalic vein patent.   At the end of the procedure, there was a pulse within the cephalic vein but poor outflow.  The vascular sheaths were removed with pursestring sutures.  Findings:The left cephalic vein fistula anastomosis is near the elbow.  The vein was patent near the arterial anastomosis but occluded in the mid and upper arm.  The central cephalic vein was also patent.  A large amount of the thrombus was removed using the AngioJet and mechanical thrombectomy device.  Eventually, there was some flow throughout the fistula after balloon angioplasty of a tight stenosis in the central cephalic vein and recurrent stenosis in the mid humeral cephalic vein. At the end of the procedure, there was still a large amount  of thrombus within the aneurysmal segment of the mid humeral cephalic vein.  Partial recanalization of the upper left arm cephalic vein.  Impression:Declot of the left upper extremity fistula.  There is improved flow through the fistula but the outflow is tenuous and high risk to occlude again.  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  Discussed the declot procedure with Dr. Hyman Hopes.   Recommend trying the fistula for dialysis.  If the fistula occludes again, recommend a catheter and surgical consultation for a new access.  Original Report Authenticated By: Richarda Overlie, M.D.   Ir Fluoro Guide Cv Line Left  08/20/2011  *RADIOLOGY REPORT*  Clinical data: End-stage renal disease.  Occluded arm graft.  Needs access for hemodialysis.  TUNNELED HEMODIALYSIS CATHETER PLACEMENT WITH ULTRASOUND AND FLUOROSCOPIC GUIDANCE:  Comparison: None  Technique: The procedure, risks, benefits, and alternatives were explained to the patient.  Questions regarding the procedure were encouraged and answered.  The patient understands and consents to the procedure. The patient was on adequate   antibiotic coverage, so no additional preprocedural antibiotics were administered. Ultrasound suggested occlusion of the right IJ vein at the thoracic inlet.    Patency of the left IJ vein was confirmed with ultrasound with image documentation. An appropriate skin site was determined. Region was prepped using maximum barrier technique including cap and mask, sterile gown, sterile gloves, large sterile sheet, and Chlorhexidine   as cutaneous antisepsis. The region was infiltrated locally with 1% lidocaine. Under real-time ultrasound guidance, the left IJ vein was accessed with a 21 gauge micropuncture needle; the needle tip within the vein was confirmed with ultrasound image documentation.   Needle exchanged over the 018 guidewire for transitional dilator, which allowed advancement of a Benson wire into the IVC. Over this, an  MPA catheter was advanced. A Hemosplit 23 hemodialysis catheter was tunneled from the right anterior chest wall approach to the left IJ dermatotomy site. The MPA catheter was exchanged over an Amplatz wire for serial vascular dilators which allow placement of a peel-away sheath, through which the catheter was advanced under intermittent fluoroscopy, positioned with its tips in the proximal and midright atrium. Spot chest radiograph confirms good catheter position. No pneumothorax. Catheter was flushed and primed per protocol. Catheter secured externally with O Prolene sutures. The left IJ   dermatotomy site was closed with Dermabond. No immediate complication.  IMPRESSION:  1. Technically successful placement of tunneled left IJ hemodialysis catheter with ultrasound and fluoroscopic guidance. Ready for routine use.  Access management: Remains approachable for percutaneous intervention as needed.  Original Report Authenticated  By: D. DANIEL HASSELL III, M.D.   Ir Av Dialysis Graft Declot  08/18/2011  *RADIOLOGY REPORT*  Clinical history:End-stage renal disease and clotted left upper extremity cephalic vein fistula.  PROCEDURE(S): DIALYSIS  FISTULA DECLOT; CEPHALIC VEIN ANGIOPLASTY; ULTRASOUND GUIDANCE FOR VASCULAR ACCESS  Physician: Rachelle Hora. Henn, MD  Medications:Heparin 3000 units, TPA 2 mg  Fluoroscopy time: 22.5 minutes  Contrast: 100 ml Omnipaque 300  Procedure:Informed consent was obtained for a declot procedure. The left upper arm was prepped and draped in a sterile fashion. Maximal barrier sterile technique was utilized including caps, mask, sterile gowns, sterile gloves, sterile drape, hand hygiene and skin antiseptic.  The skin was anesthetized with 1% lidocaine. The fistula was accessed using a 21 gauge needle towards the central venous system with ultrasound guidance.  Ultrasound images were obtained for documentation. Micropuncture catheter was placed. A 6-French vascular sheath was placed.  A Kumpe  catheter was advanced in the central veins using a Glidewire.  Central venogram was performed.  The catheter was pulled back as TPA was infused within the cephalic vein thrombus.  Fluoroscopic images were saved for documentation. A Bentson wire was placed centrally and the cephalic vein was treated with the AngioJet thrombectomy device. The cephalic vein was also angioplastied with a 6 mm x 40 mm balloon.  Thrombus was treated again with the AngioJet device and the PTD thrombectomy device.  Using ultrasound guidance, a 21 gauge needle was directed into the vein towards the arterial anastomosis.  Micropuncture dilator set was placed and a 6 [place] vascular sheath was placed. Although there was already flow from the arterial system, a Fogarty balloon was pulled through the vein in order to dislodge thrombus. There was improved flow within the fistula after using the Fogarty balloon.  The central cephalic vein was angioplastied with 6 mm x 40 mm balloon.  At this point, there was flow throughout the fistula.  However, the flow was only temporary.  The central cephalic vein and mid humeral cephalic vein required angioplasty multiple times in order to keep the cephalic vein patent.   At the end of the procedure, there was a pulse within the cephalic vein but poor outflow.  The vascular sheaths were removed with pursestring sutures.  Findings:The left cephalic vein fistula anastomosis is near the elbow.  The vein was patent near the arterial anastomosis but occluded in the mid and upper arm.  The central cephalic vein was also patent.  A large amount of the thrombus was removed using the AngioJet and mechanical thrombectomy device.  Eventually, there was some flow throughout the fistula after balloon angioplasty of a tight stenosis in the central cephalic vein and recurrent stenosis in the mid humeral cephalic vein. At the end of the procedure, there was still a large amount of thrombus within the aneurysmal segment of  the mid humeral cephalic vein.  Partial recanalization of the upper left arm cephalic vein.  Impression:Declot of the left upper extremity fistula.  There is improved flow through the fistula but the outflow is tenuous and high risk to occlude again.  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  Discussed the declot procedure with Dr. Hyman Hopes.   Recommend trying the fistula for dialysis.  If the fistula occludes again, recommend a catheter and surgical consultation for a new access.  Original Report Authenticated By: Richarda Overlie, M.D.   Ir Angio Av Shunt Addl Access  08/18/2011  *RADIOLOGY REPORT*  Clinical history:End-stage renal disease and clotted left  upper extremity cephalic vein fistula.  PROCEDURE(S): DIALYSIS  FISTULA DECLOT; CEPHALIC VEIN ANGIOPLASTY; ULTRASOUND GUIDANCE FOR VASCULAR ACCESS  Physician: Rachelle Hora. Henn, MD  Medications:Heparin 3000 units, TPA 2 mg  Fluoroscopy time: 22.5 minutes  Contrast: 100 ml Omnipaque 300  Procedure:Informed consent was obtained for a declot procedure. The left upper arm was prepped and draped in a sterile fashion. Maximal barrier sterile technique was utilized including caps, mask, sterile gowns, sterile gloves, sterile drape, hand hygiene and skin antiseptic.  The skin was anesthetized with 1% lidocaine. The fistula was accessed using a 21 gauge needle towards the central venous system with ultrasound guidance.  Ultrasound images were obtained for documentation. Micropuncture catheter was placed. A 6-French vascular sheath was placed.  A Kumpe catheter was advanced in the central veins using a Glidewire.  Central venogram was performed.  The catheter was pulled back as TPA was infused within the cephalic vein thrombus.  Fluoroscopic images were saved for documentation. A Bentson wire was placed centrally and the cephalic vein was treated with the AngioJet thrombectomy device. The cephalic vein was also angioplastied with a 6 mm x 40 mm balloon.   Thrombus was treated again with the AngioJet device and the PTD thrombectomy device.  Using ultrasound guidance, a 21 gauge needle was directed into the vein towards the arterial anastomosis.  Micropuncture dilator set was placed and a 6 [place] vascular sheath was placed. Although there was already flow from the arterial system, a Fogarty balloon was pulled through the vein in order to dislodge thrombus. There was improved flow within the fistula after using the Fogarty balloon.  The central cephalic vein was angioplastied with 6 mm x 40 mm balloon.  At this point, there was flow throughout the fistula.  However, the flow was only temporary.  The central cephalic vein and mid humeral cephalic vein required angioplasty multiple times in order to keep the cephalic vein patent.   At the end of the procedure, there was a pulse within the cephalic vein but poor outflow.  The vascular sheaths were removed with pursestring sutures.  Findings:The left cephalic vein fistula anastomosis is near the elbow.  The vein was patent near the arterial anastomosis but occluded in the mid and upper arm.  The central cephalic vein was also patent.  A large amount of the thrombus was removed using the AngioJet and mechanical thrombectomy device.  Eventually, there was some flow throughout the fistula after balloon angioplasty of a tight stenosis in the central cephalic vein and recurrent stenosis in the mid humeral cephalic vein. At the end of the procedure, there was still a large amount of thrombus within the aneurysmal segment of the mid humeral cephalic vein.  Partial recanalization of the upper left arm cephalic vein.  Impression:Declot of the left upper extremity fistula.  There is improved flow through the fistula but the outflow is tenuous and high risk to occlude again.  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  Discussed the declot procedure with Dr. Hyman Hopes.   Recommend trying the fistula for  dialysis.  If the fistula occludes again, recommend a catheter and surgical consultation for a new access.  Original Report Authenticated By: Richarda Overlie, M.D.   Ir US Guide Vasc Access Left  08/20/2011  *RADIOLOGY REPORT*  Clinical data: End-stage renal disease.  Occluded arm graft.  Needs access for hemodialysis.  TUNNELED HEMODIALYSIS CATHETER PLACEMENT WITH ULTRASOUND AND FLUOROSCOPIC GUIDANCE:  Comparison: None  Technique: The procedure, risks, benefits,  and alternatives were explained to the patient.  Questions regarding the procedure were encouraged and answered.  The patient understands and consents to the procedure. The patient was on adequate   antibiotic coverage, so no additional preprocedural antibiotics were administered. Ultrasound suggested occlusion of the right IJ vein at the thoracic inlet.    Patency of the left IJ vein was confirmed with ultrasound with image documentation. An appropriate skin site was determined. Region was prepped using maximum barrier technique including cap and mask, sterile gown, sterile gloves, large sterile sheet, and Chlorhexidine   as cutaneous antisepsis. The region was infiltrated locally with 1% lidocaine. Under real-time ultrasound guidance, the left IJ vein was accessed with a 21 gauge micropuncture needle; the needle tip within the vein was confirmed with ultrasound image documentation.   Needle exchanged over the 018 guidewire for transitional dilator, which allowed advancement of a Benson wire into the IVC. Over this, an MPA catheter was advanced. A Hemosplit 23 hemodialysis catheter was tunneled from the right anterior chest wall approach to the left IJ dermatotomy site. The MPA catheter was exchanged over an Amplatz wire for serial vascular dilators which allow placement of a peel-away sheath, through which the catheter was advanced under intermittent fluoroscopy, positioned with its tips in the proximal and midright atrium. Spot chest radiograph confirms  good catheter position. No pneumothorax. Catheter was flushed and primed per protocol. Catheter secured externally with O Prolene sutures. The left IJ   dermatotomy site was closed with Dermabond. No immediate complication.  IMPRESSION:  1. Technically successful placement of tunneled left IJ hemodialysis catheter with ultrasound and fluoroscopic guidance. Ready for routine use.  Access management: Remains approachable for percutaneous intervention as needed.  Original Report Authenticated By: Osa Craver, M.D.   Ir US Guide Vasc Access Left  08/18/2011  *RADIOLOGY REPORT*  Clinical history:End-stage renal disease and clotted left upper extremity cephalic vein fistula.  PROCEDURE(S): DIALYSIS  FISTULA DECLOT; CEPHALIC VEIN ANGIOPLASTY; ULTRASOUND GUIDANCE FOR VASCULAR ACCESS  Physician: Rachelle Hora. Henn, MD  Medications:Heparin 3000 units, TPA 2 mg  Fluoroscopy time: 22.5 minutes  Contrast: 100 ml Omnipaque 300  Procedure:Informed consent was obtained for a declot procedure. The left upper arm was prepped and draped in a sterile fashion. Maximal barrier sterile technique was utilized including caps, mask, sterile gowns, sterile gloves, sterile drape, hand hygiene and skin antiseptic.  The skin was anesthetized with 1% lidocaine. The fistula was accessed using a 21 gauge needle towards the central venous system with ultrasound guidance.  Ultrasound images were obtained for documentation. Micropuncture catheter was placed. A 6-French vascular sheath was placed.  A Kumpe catheter was advanced in the central veins using a Glidewire.  Central venogram was performed.  The catheter was pulled back as TPA was infused within the cephalic vein thrombus.  Fluoroscopic images were saved for documentation. A Bentson wire was placed centrally and the cephalic vein was treated with the AngioJet thrombectomy device. The cephalic vein was also angioplastied with a 6 mm x 40 mm balloon.  Thrombus was treated again with the  AngioJet device and the PTD thrombectomy device.  Using ultrasound guidance, a 21 gauge needle was directed into the vein towards the arterial anastomosis.  Micropuncture dilator set was placed and a 6 [place] vascular sheath was placed. Although there was already flow from the arterial system, a Fogarty balloon was pulled through the vein in order to dislodge thrombus. There was improved flow within the fistula after using the Fogarty  balloon.  The central cephalic vein was angioplastied with 6 mm x 40 mm balloon.  At this point, there was flow throughout the fistula.  However, the flow was only temporary.  The central cephalic vein and mid humeral cephalic vein required angioplasty multiple times in order to keep the cephalic vein patent.   At the end of the procedure, there was a pulse within the cephalic vein but poor outflow.  The vascular sheaths were removed with pursestring sutures.  Findings:The left cephalic vein fistula anastomosis is near the elbow.  The vein was patent near the arterial anastomosis but occluded in the mid and upper arm.  The central cephalic vein was also patent.  A large amount of the thrombus was removed using the AngioJet and mechanical thrombectomy device.  Eventually, there was some flow throughout the fistula after balloon angioplasty of a tight stenosis in the central cephalic vein and recurrent stenosis in the mid humeral cephalic vein. At the end of the procedure, there was still a large amount of thrombus within the aneurysmal segment of the mid humeral cephalic vein.  Partial recanalization of the upper left arm cephalic vein.  Impression:Declot of the left upper extremity fistula.  There is improved flow through the fistula but the outflow is tenuous and high risk to occlude again.  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  Discussed the declot procedure with Dr. Hyman Hopes.   Recommend trying the fistula for dialysis.  If the fistula occludes again,  recommend a catheter and surgical consultation for a new access.  Original Report Authenticated By: Richarda Overlie, M.D.   Dg Chest Port 1 View  08/30/2011  *RADIOLOGY REPORT*  Clinical Data: Check endotracheal tube placement.  Pneumonia.  PORTABLE CHEST - 1 VIEW  Comparison: Portable chest 08/28/2011.  Findings: The patient has been extubated.  The NG tube was removed as well.  A left IJ dialysis catheter is stable.  There is slight improved aeration of the left lung.  Right-sided airspace disease is not significantly changed.  Bilateral pleural effusions are evident, right greater than left.  Mild cardiomegaly is stable. Pancreatic calcifications are again noted.  IMPRESSION:  1.  Slight improved aeration of the left lung. 2.  Right-sided airspace disease is stable. 3.  Slight increase in bilateral pleural effusions, right greater than left. 4.  The patient has been extubated.  The NG tube was removed as well. 5.  The appearance remains concerning for bilateral pneumonia.  Original Report Authenticated By: Jamesetta Orleans. MATTERN, M.D.   Dg Chest Port 1 View  08/28/2011  *RADIOLOGY REPORT*  Clinical Data: Intubated patient.  PORTABLE CHEST - 1 VIEW  Comparison: Portable chest 08/27/2011.  Findings: Support tubes and lines are unchanged.  Extensive bilateral airspace disease persists.  Aeration in the right chest has worsened.  Right greater than left pleural effusions noted.  IMPRESSION: Extensive bilateral airspace disease shows worsening on the right.  Original Report Authenticated By: Bernadene Bell. D'ALESSIO, M.D.   Dg Chest Portable 1 View  08/27/2011  *RADIOLOGY REPORT*  Clinical Data: Line placement.  PORTABLE CHEST - 1 VIEW  Comparison: 08/26/2011.  Findings: The support apparatus is in good position.  The endotracheal tube is now 4.6 cm above the carina.  Interval progressive airspace consolidation in the left lung could reflect worsening edema or infiltrate.  Small effusions are stable.  IMPRESSION:  1.   Support apparatus in good position without complicating features. 2.  Worsening airspace consolidation, left greater than  right could reflect worsening edema or infiltrate. 3.  Stable small effusions.  Original Report Authenticated By: P. Loralie Champagne, M.D.   Dg Chest Port 1 View  08/26/2011  *RADIOLOGY REPORT*  Clinical Data: Endotracheal tube placement.  PORTABLE CHEST - 1 VIEW  Comparison: 08/16/2011.  Findings: The endotracheal tube is right at the right mainstem bronchus and should be retracted 3 cm.  The dialysis catheter is stable.  The NG tube is in the stomach.  There is pulmonary edema, areas of atelectasis and a right pleural effusion.  IMPRESSION: The endotracheal tube is at the right mainstem bronchus and should be retracted 3 cm.  Original Report Authenticated By: P. Loralie Champagne, M.D.   Dg Chest Port 1 View  08/16/2011  *RADIOLOGY REPORT*  Clinical Data: Pneumonia.  Pleural effusion.  PORTABLE CHEST - 1 VIEW  Comparison: 08/15/2011  Findings: Nasogastric tube extends into the stomach.  Cardiomegaly noted with mild interstitial accentuation and increased obscuration of the right hemidiaphragm primarily ascribed to pleural effusion and associated passive atelectasis.  There is mild atelectasis at the left lung base along with a small left pleural effusion.  Lung volumes are slightly worsened compared to yesterday's exam.  Atherosclerotic calcification of the aortic arch noted. Glenohumeral degenerative findings noted.  IMPRESSION:  1.  Increase in apparent size of the right pleural effusion with associated passive atelectasis. 2.  Small left pleural effusion with left basilar atelectasis also noted. 3.  Cardiomegaly with minimal interstitial accentuation.  Original Report Authenticated By: Dellia Cloud, M.D.   Dg Chest Port 1 View  08/15/2011  *RADIOLOGY REPORT*  Clinical Data: Status post right thoracentesis.  PORTABLE CHEST - 1 VIEW  Comparison: Earlier the same day  Findings: 1556  hours.  Right pleural effusion has decreased in the interval.  No evidence for pneumothorax.  There is some subsegmental atelectasis at the right base. The NG tube passes into the stomach although the distal tip position is not included on the film. The cardiopericardial silhouette is enlarged.  Diffuse interstitial opacity is improved in the interval, suggesting resolving edema.  IMPRESSION: No evidence for pneumothorax after right thoracentesis.  Original Report Authenticated By: ERIC A. MANSELL, M.D.   Dg Chest Port 1 View  08/15/2011  *RADIOLOGY REPORT*  Clinical Data: Shortness of breath and pleural effusion.  PORTABLE CHEST - 1 VIEW  Comparison: 08/14/2011  Findings: The NG tube passes into the stomach although the distal tip position is not included on the film. The cardiopericardial silhouette is enlarged.  Vascular congestion with diffuse interstitial opacity is consistent with edema.  There is bibasilar atelectasis and right pleural effusion.  IMPRESSION: No substantial change.  Cardiomegaly with edema and right pleural effusion.  Original Report Authenticated By: ERIC A. MANSELL, M.D.   Portable Chest Xray In Am  08/14/2011  *RADIOLOGY REPORT*  Clinical Data: Respiratory failure  PORTABLE CHEST - 1 VIEW  Comparison: Aug 13, 2011  Findings: There is cardiomegaly and pulmonary edema, as well as a moderate sized right pleural effusion.  Overall, the findings  have not significantly changed.  The nasogastric tube tip courses off the inferior film.  IMPRESSION: No significant change in cardiomegaly, pulmonary edema, moderate sized right pleural effusion.  Original Report Authenticated By: Brandon Melnick, M.D.   Dg Chest Port 1 View  08/13/2011  *RADIOLOGY REPORT*  Clinical Data: Unresponsive.  Shortness of breath and abdominal distention.  PORTABLE CHEST - 1 VIEW  Comparison: 07/31/2011.  Findings: Trachea is midline.  Heart size  stable.  There is bilateral air space disease, right greater than left,  with progression from 07/31/2011.  Associated right pleural effusion. There may be a tiny left pleural effusion.  IMPRESSION: Worsening bilateral air space disease with an associated right pleural effusion.  Question tiny left pleural effusion.  Congestive heart failure is likely.  Original Report Authenticated By: Reyes Ivan, M.D.   Dg Abd Portable 1v  08/16/2011  *RADIOLOGY REPORT*  Clinical Data: Small bowel obstruction  PORTABLE ABDOMEN - 1 VIEW  Comparison: 08/15/2011  Findings: Vascular calcifications and rim calcified left renal lesions noted.  Nasogastric tube extends into the stomach.  The right femoral line projects over the pelvis.  Left pelvic skin clips noted.  Currently most of the bowel is gasless.  The visualized loops of bowel do not demonstrate bowel dilatation.  Lumbar spondylosis is again noted. Pleural effusions noted at the lung bases, with cardiomegaly.  IMPRESSION:  1.  No dilated bowel is currently visible, although much of the bowel is gasless. 2.  Small right and smaller left pleural effusions. 3.  Calcified left renal cysts or masses. 4.  Vascular calcifications. 5.  Right femoral line tip projects over the right sacral ala.  Original Report Authenticated By: Dellia Cloud, M.D.   Dg Abd Portable 1v  08/15/2011  *RADIOLOGY REPORT*  Clinical Data: Small bowel obstruction.  PORTABLE ABDOMEN - 1 VIEW  Comparison: Plain films and CT abdomen and pelvis 08/13/2011.  Findings: New NG tube and right femoral catheter are in place. Surgical staples to the left of midline again noted.  Gas filled small bowel loops are again seen but appear less numerous and prominent.  Bilateral upper quadrant calcifications are noted and consistent with renal calcifications seen on CT.  IMPRESSION: Mild improvement small bowel obstruction.  Original Report Authenticated By: Bernadene Bell. Maricela Curet, M.D.   Dg Abd Portable 1v  08/13/2011  *RADIOLOGY REPORT*  Clinical Data: Unresponsive.  Shortness  of breath and abdominal distention.  PORTABLE ABDOMEN - 1 VIEW  Comparison: None.  Findings: There is mild gaseous distention of small bowel loops. Some colonic gas and stool are seen.  Surgical skin staples overlie the left lower quadrant.  Peripherally calcified structure in the left paraspinal region has the shape of the left kidney.  Additional tubular calcifications are seen in the left upper quadrant.  There may be small calcifications in the right paraspinal region.  IMPRESSION:  1.  Mild small bowel distention with possible wall thickening.  CT abdomen pelvis with contrast would likely be helpful in further evaluation, as clinically indicated. 2.  Calcified structures in the left upper quadrant, and likely right paraspinal region, as discussed above.  These could also be further assessed with CT.  Original Report Authenticated By: Reyes Ivan, M.D.   Dg Abd Portable 2v  08/28/2011  *RADIOLOGY REPORT*  Clinical Data: Abdominal distention.  Question ileus or small bowel obstruction.  PORTABLE ABDOMEN - 2 VIEW  Comparison: CT abdomen pelvis 08/27/2011 and plain film of the abdomen 08/20/2011.  Findings: The abdomen is nearly gasless.  NG tube and right groin catheter are in place.  Calcified cystic lesion in the left upper quadrant as seen on CT scan again noted.  Multiple calcifications in the spleen are also again seen.  IMPRESSION: The abdomen is nearly gasless making assessment of small bowel difficult.  No definite acute finding.  Original Report Authenticated By: Bernadene Bell. Maricela Curet, M.D.    Medications: I have reviewed the patient's current  medications. Scheduled Meds:    . amLODipine  10 mg Oral Daily  . antiseptic oral rinse  15 mL Mouth Rinse q12n4p  . chlorhexidine  15 mL Mouth Rinse BID  . cinacalcet  60 mg Oral Q breakfast  . darbepoetin (ARANESP) injection - DIALYSIS  100 mcg Intravenous Q Wed-HD  . doxercalciferol  6 mcg Intravenous Q M,W,F-HD  . famotidine  20 mg Oral QHS    . feeding supplement (NEPRO CARB STEADY)  237 mL Oral Q24H  . metoprolol tartrate  100 mg Oral BID  . multivitamin  1 tablet Oral QHS  . sodium chloride  10-40 mL Intracatheter Q12H  . vancomycin  125 mg Oral Q6H  . DISCONTD: metronidazole  500 mg Intravenous Q8H   Continuous Infusions:  PRN Meds:.sodium chloride, heparin, heparin, morphine injection, oxyCODONE, sodium chloride Assessment/Plan: Patient Active Hospital Problem List: Septic shock (08/27/2011)   Assessment: Septic shock was associated with fulminant C. difficile infection and hospital-acquired pneumonia. The patient is fully recovered from his septic shock is no hemodynamically stable.diarrhea.    Chronic diastolic CHF (congestive heart failure) (07/23/2010)   Assessment: Clinically well compensated     ESRD (end stage renal disease) ()   Assessment: Hemodialysis dependent and tolerating well by mouth     C. difficile colitis (08/14/2011)   Assessment:  The patient had 5 days of IV Flagyl and  7/14 days of by mouth vancomycin . Based on clinical progression, Flagyl has ben discontinued and patient continues to improve.  Will complete a total of 14 days of Vancomycin.  CAD (coronary artery disease) (08/14/2011)   Assessment: No chest pain. Information noted regarding cardiology evaluation.    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    LOS: 11 days

## 2011-09-06 NOTE — Progress Notes (Signed)
Patient ID: [name], male   DOB: 03-Apr-1950, 61 y.o.   MRN: [id number]   [facility] Progress Note    Subjective:   No acute issues overnight- Diarrhea remains resolved   Objective:   BP 131/59  Pulse 63  Temp 97.7 F (36.5 C) (Oral)  Resp 18  Ht 5\' 6"  (1.676 m)  Wt 68.9 kg (151 lb 14.4 oz)  BMI 24.52 kg/m2  SpO2 98%  Physical Exam: WUJ:[id number] on recliner AOZ:HYQMV RRR, normal S1 and S2  Resp:CTA bilaterally, no rales/rhonchi HQI:ONGE, flat, NT, BS normal XBM:WUXL LE forefoot ischemia noted- no LE edema  Labs: BMET  Lab 09/05/11 0616 09/03/11 1011 09/01/11 0552 08/31/11 0442  NA 136 137 139 141  K 3.6 4.2 3.7 3.6  CL 100 101 105 108  CO2 27 24 23 27   GLUCOSE 87 51* 80 91  BUN 14 20 18 10   CREATININE 4.36* 4.75* 4.30* 3.05*  ALB -- -- -- --  CALCIUM 7.9* 8.1* 8.5 8.5  PHOS 3.9 4.1 -- --   CBC  Lab 09/05/11 0616 09/03/11 1011 09/01/11 0552 08/31/11 0442  WBC 7.3 7.4 7.2 7.8  NEUTROABS -- -- -- --  HGB 8.5* 8.8* 8.0* 7.8*  HCT 27.0* 28.1* 26.0* 24.7*  MCV 89.1 90.1 90.3 89.5  PLT 190 159 129* 133*    Medications:      . amLODipine  10 mg Oral Daily  . antiseptic oral rinse  15 mL Mouth Rinse q12n4p  . chlorhexidine  15 mL Mouth Rinse BID  . cinacalcet  60 mg Oral Q breakfast  . darbepoetin (ARANESP) injection - DIALYSIS  100 mcg Intravenous Q Wed-HD  . doxercalciferol  6 mcg Intravenous Q M,W,F-HD  . famotidine  20 mg Oral QHS  . feeding supplement (NEPRO CARB STEADY)  237 mL Oral Q24H  . metoprolol tartrate  100 mg Oral BID  . multivitamin  1 tablet Oral QHS  . sodium chloride  10-40 mL Intracatheter Q12H  . vancomycin  125 mg Oral Q6H  . DISCONTD: metronidazole  500 mg Intravenous Q8H     Assessment/ Plan:   1. Sepsis Syndrome with C diff colitis/PNA: Improved on antibiotic therapy with Primaxin, vancomycin and metronidazole- now on PO vanco only for C diff.  2.C Diff colitis: Resolved and now on PO vancomycin  3.  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    Zetta Bills, MD 09/06/2011, 10:11 AM

## 2011-09-07 DIAGNOSIS — A0472 Enterocolitis due to Clostridium difficile, not specified as recurrent: Secondary | ICD-10-CM

## 2011-09-07 DIAGNOSIS — K5900 Constipation, unspecified: Secondary | ICD-10-CM

## 2011-09-07 DIAGNOSIS — I70269 Atherosclerosis of native arteries of extremities with gangrene, unspecified extremity: Secondary | ICD-10-CM

## 2011-09-07 DIAGNOSIS — N186 End stage renal disease: Secondary | ICD-10-CM

## 2011-09-07 MED ORDER — SENNOSIDES-DOCUSATE SODIUM 8.6-50 MG PO TABS
1.0000 | ORAL_TABLET | Freq: Every evening | ORAL | Status: DC | PRN
  Filled 2011-09-07: qty 1

## 2011-09-07 MED ORDER — HEPARIN SODIUM (PORCINE) 1000 UNIT/ML DIALYSIS
2000.0000 [IU] | INTRAMUSCULAR | Status: DC | PRN
  Administered 2011-09-08: 2000 [IU] via INTRAVENOUS_CENTRAL
  Filled 2011-09-07: qty 2

## 2011-09-07 NOTE — Progress Notes (Signed)
Subjective:  No complaints.   Objective:    Vital signs in last 24 hours: Filed Vitals:   09/06/11 1800 09/06/11 2115 09/07/11 0457 09/07/11 0939  BP: 163/87 126/60 164/81 176/106  Pulse: 61 65 65 63  Temp: 98.3 F (36.8 C) 98.8 F (37.1 C) 98.9 F (37.2 C) 97.9 F (36.6 C)  TempSrc: Oral Oral Oral Oral  Resp: 16 18 16 16   Height:      Weight:  70.1 kg (154 lb 8.7 oz)    SpO2: 96% 98% 99% 95%   Weight change: 1.1 kg (2 lb 6.8 oz)  Intake/Output Summary (Last 24 hours) at 09/07/11 1606 Last data filed at 09/06/11 1800  Gross per 24 hour  Intake    340 ml  Output      0 ml  Net    340 ml   Labs: Basic Metabolic Panel:  Lab 09/05/11 4132 09/03/11 1011 09/01/11 0552  NA 136 137 139  K 3.6 4.2 3.7  CL 100 101 105  CO2 27 24 23   GLUCOSE 87 51* 80  BUN 14 20 18   CREATININE 4.36* 4.75* 4.30*  ALB -- -- --  CALCIUM 7.9* 8.1* 8.5  PHOS 3.9 4.1 --   Liver Function Tests:  Lab 09/05/11 0616 09/03/11 1011  AST -- --  ALT -- --  ALKPHOS -- --  BILITOT -- --  PROT -- --  ALBUMIN 1.5* 1.7*   No results found for this basename: LIPASE:3,AMYLASE:3 in the last 168 hours No results found for this basename: AMMONIA:3 in the last 168 hours CBC:  Lab 09/05/11 0616 09/03/11 1011 09/01/11 0552  WBC 7.3 7.4 7.2  NEUTROABS -- -- --  HGB 8.5* 8.8* 8.0*  HCT 27.0* 28.1* 26.0*  MCV 89.1 90.1 90.3  PLT 190 159 129*   Cardiac Enzymes: No results found for this basename: CKTOTAL:5,CKMB:5,CKMBINDEX:5,TROPONINI:5 in the last 168 hours CBG:  Lab 09/04/11 0741 09/03/11 2132 09/03/11 1602 09/03/11 1554 09/03/11 1528  GLUCAP 95 113* 68* 76 53*    Iron Studies: No results found for this basename: IRON:30,TIBC:30,SATURATION RATIOS:30,TRANSFERRIN:30,FERRITIN:30 in the last 168 hours  Physical Exam:  Blood pressure 176/106, pulse 63, temperature 97.9 F (36.6 C), temperature source Oral, resp. rate 16, height 5\' 6"  (1.676 m), weight 70.1 kg (154 lb 8.7 oz), SpO2  95.00%.  GMW:[id number] on recliner  IHK:VQQVZ RRR, normal S1 and S2  Resp:CTA bilaterally, no rales/rhonchi  DGL:OVFI, flat, NT, BS normal  EPP:IRJJ LE forefoot ischemia noted- no LE edema  Impression/Plan 1. Sepsis Syndrome with C diff colitis/PNA: Improved on antibiotic therapy with Primaxin, vancomycin and metronidazole- now on PO vanco only for C diff.  2. C Diff colitis: Resolved and now on PO vancomycin  3. ESRD: continue MWF HD. Dialysis tomorrow.  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 Left foot ischemia: await further management by [facility]- anticipated plans for revascularization +//- TM amputation per Dr. Almon Hercules  MD [facility] [phone] pgr    [phone] cell 09/07/2011, 4:06 PM

## 2011-09-07 NOTE — Progress Notes (Signed)
Subjective: Patient no complaints today. He's had no further diarrhea. As a matter of fact the patient's not had a bowel movement in 2 days.   Interval History: Vascular surgery plans left Fem-pop bypass early next week. Information noted from vascular surgery stating that the patient has been deemed by cardiology to be an acceptable risk for vascular surgery.  Objective: Filed Vitals:   09/06/11 2115 09/07/11 0457 09/07/11 0939 09/07/11 1743  BP: 126/60 164/81 176/106 175/76  Pulse: 65 65 63 61  Temp: 98.8 F (37.1 C) 98.9 F (37.2 C) 97.9 F (36.6 C) 98.6 F (37 C)  TempSrc: Oral Oral Oral Oral  Resp: 18 16 16 16   Height:      Weight: 70.1 kg (154 lb 8.7 oz)     SpO2: 98% 99% 95% 92%   Weight change: 1.1 kg (2 lb 6.8 oz)  Intake/Output Summary (Last 24 hours) at 09/07/11 1905 Last data filed at 09/07/11 1800  Gross per 24 hour  Intake    220 ml  Output      0 ml  Net    220 ml    General: Alert, awake, oriented x3, in no acute distress.  HEENT: [place]/AT PEERL, EOMI Neck: Trachea midline,  no masses, no thyromegal,y no JVD, no carotid bruit OROPHARYNX:  Moist, No exudate/ erythema/lesions.  Heart: Regular rate and rhythm, without murmurs, rubs, gallops, PMI non-displaced, no heaves or thrills on palpation.  Lungs: Clear to auscultation, no wheezing or rhonchi noted. No increased vocal fremitus resonant to percussion  Abdomen: Soft, nontender, nondistended, positive bowel sounds, no masses no hepatosplenomegaly noted..  Neuro: No focal neurological deficits noted  Musculoskeletal: No warm swelling or erythema around joints, no spinal tenderness noted. Extremities: Left foot is cool to touch and there is no drainage from foot.   Lab Results:  [facility] 09/05/11 0616  NA 136  K 3.6  CL 100  CO2 27  GLUCOSE 87  BUN 14  CREATININE 4.36*  CALCIUM 7.9*  MG --  PHOS 3.9    Basename 09/05/11 0616  AST --  ALT --  ALKPHOS --  BILITOT --  PROT --  ALBUMIN 1.5*    No results found for this basename: LIPASE:2,AMYLASE:2 in the last 72 hours  Basename 09/05/11 0616  WBC 7.3  NEUTROABS --  HGB 8.5*  HCT 27.0*  MCV 89.1  PLT 190   No results found for this basename: CKTOTAL:3,CKMB:3,CKMBINDEX:3,TROPONINI:3 in the last 72 hours No components found with this basename: POCBNP:3 No results found for this basename: DDIMER:2 in the last 72 hours No results found for this basename: HGBA1C:2 in the last 72 hours No results found for this basename: CHOL:2,HDL:2,LDLCALC:2,TRIG:2,CHOLHDL:2,LDLDIRECT:2 in the last 72 hours No results found for this basename: TSH,T4TOTAL,FREET3,T3FREE,THYROIDAB in the last 72 hours No results found for this basename: VITAMINB12:2,FOLATE:2,FERRITIN:2,TIBC:2,IRON:2,RETICCTPCT:2 in the last 72 hours  Micro Results: No results found for this or any previous visit (from the past 240 hour(s)).  Studies/Results: Ct Abdomen Pelvis Wo Contrast  08/13/2011  *RADIOLOGY REPORT*  Clinical Data: Abdominal pain.  Unresponsive.  CT ABDOMEN AND PELVIS WITHOUT CONTRAST  Technique:  Multidetector CT imaging of the abdomen and pelvis was performed following the standard protocol without intravenous contrast.  Comparison: None.  Findings: Lack of intravenous and oral contrast severely limits the exam.  Moderate bilateral pleural effusions and bibasilar consolidation. Coronary artery calcifications.  Aortic valvular calcifications.  Gallbladder is decompressed.  Liver is within normal limits.  Small amount of free fluid anterior  to the liver.  The stomach is markedly distended.  Small bowel loops are also markedly distended.  Distal small bowel is decompressed.  There is a small amount of enteric contrast in the lower abdomen at the midline likely contained within a bowel loop.  Colon is also relatively decompressed.  Small amount of free fluid is seen layering in the pelvis.  The kidneys have a markedly abnormal appearance bilaterally.  The left kidney  is enlarged and markedly deformed with areas of curvilinear calcification and mixed density.  Similar changes in the right kidney but to a lesser degree.  Underlying renal mass cannot be excluded.  Dense vascular calcifications are noted.  No obvious free intraperitoneal gas.  Metallic staples are seen in the anterior abdominal wall to the left of midline in the lower abdomen.  Prominent degenerative changes of the hip joints there is a lytic area in  in the right pelvis, above the right acetabulum.  There is loss of cortex in the overlying bone.  There is also a lytic area in the posterior inferior right acetabulum on image 88.  These lytic areas are greater than one would expect with simple degenerative changes.  Aggressive process cannot be excluded.  Gas within venous structures near the right groin are likely iatrogenic.  IMPRESSION: Very limited exam.  Bilateral pleural effusions and bibasilar consolidation.  Small bowel obstruction pattern.  The transition point is in the lower abdomen.  There are lytic lesions about the right acetabulum as described. An aggressive process such as metastatic disease or myeloma cannot be excluded.  MRI may be helpful.  Markedly abnormal appearance of the kidneys.  Renal mass cannot be excluded.  Original Report Authenticated By: Donavan Burnet, M.D.   Dg Chest 1 View  08/13/2011  *RADIOLOGY REPORT*  Clinical Data: Pneumonia  CHEST - 1 VIEW  Comparison: 08/13/2011  Findings: Diffuse airspace disease throughout the right lung. Small right pleural effusion.  Minimal linear atelectasis at the left base.  Cardiomegaly.  Airspace disease on the left has improved.  IMPRESSION: Improved left airspace disease.  Stable airspace disease throughout the right lung with a right pleural effusion.  Original Report Authenticated By: Donavan Burnet, M.D.   Dg Abd 1 View  08/20/2011  *RADIOLOGY REPORT*  Clinical Data: Abdominal distention.  ABDOMEN - 1 VIEW  Comparison: Plain film the  abdomen 08/16/2011 and CT abdomen and pelvis 08/13/2011.  Findings: There are some scattered gas filled but nondilated loops of small bowel.  Small amount of gas is seen in the ascending colon.  NG tube is no longer visualized.  Calcified cystic lesion in the left upper quadrant as seen on CT noted.  Atherosclerotic vascular disease is also noted.  IMPRESSION: Some increase in gas filled but nondilated loops of small bowel could be due to progressive small bowel obstruction.  Original Report Authenticated By: Bernadene Bell. Maricela Curet, M.D.   Ct Abdomen Pelvis W Contrast  08/27/2011  *RADIOLOGY REPORT*  Clinical Data: Abdominal pain.  Septic.  CT ABDOMEN AND PELVIS WITH CONTRAST  Technique:  Multidetector CT imaging of the abdomen and pelvis was performed following the standard protocol during bolus administration of intravenous contrast.  Contrast: 40mL OMNIPAQUE IOHEXOL 300 MG/ML  SOLN, OMNIPAQUE IOHEXOL 300 MG/ML  SOLN  Comparison: CT scan 08/13/2011.  Findings: The lung bases demonstrate bilateral pleural effusions and bilateral infiltrates.  The liver is unremarkable and stable.  No focal lesions or intrahepatic biliary dilatation.  Gallstones and gallbladder sludge are  suspected.  The pancreas is grossly normal.  The spleen is normal in size.  No focal lesions.  The adrenal glands demonstrate mild nodular enlargement suggesting nodular hyperplasia.  The kidneys demonstrate multiple bilateral rim calcified cysts. The right kidney is small and scarred.  The stomach is distended with contrast.  No obvious abnormality. There is a cluster of small bowel loops in the right mid abdomen which demonstrate mild distention.  Findings suspicious for an early closed loop obstruction or possible compartmentalized small bowel. No pneumatosis or significant wall thickening.  There is enhancement of the mucosa and the vascular pedicle demonstrates enhancement.  The remaining small bowel loops are normal in caliber. The colon  is grossly normal.  There is diffuse and fairly marked rectal wall thickening.  The aorta demonstrates advanced atherosclerotic changes.  No focal aneurysm or dissection.  The major branch vessels demonstrate severe atherosclerotic calcifications.  The bladder is grossly normal.  The prostate gland and seminal vesicles are unremarkable.  There is a small amount of free pelvic fluid.  Examination of the bony structures demonstrates stable lytic lesions.  IMPRESSION:  1.  Findings concerning for an early closed loop obstruction with compartmentalized small bowel loops.  Recommend surgical consultation. 2.  Diffuse and marked rectal wall thickening.  This could be infectious or inflammatory.  3.  Severe atherosclerotic changes involving the aorta and branch vessels. 4.  Bilateral infiltrates and effusions. 5.  Severe cystic kidney disease.  Original Report Authenticated By: P. Loralie Champagne, M.D.   Ir Pta Venous Left  08/18/2011  *RADIOLOGY REPORT*  Clinical history:End-stage renal disease and clotted left upper extremity cephalic vein fistula.  PROCEDURE(S): DIALYSIS  FISTULA DECLOT; CEPHALIC VEIN ANGIOPLASTY; ULTRASOUND GUIDANCE FOR VASCULAR ACCESS  Physician: Rachelle Hora. Henn, MD  Medications:Heparin 3000 units, TPA 2 mg  Fluoroscopy time: 22.5 minutes  Contrast: 100 ml Omnipaque 300  Procedure:Informed consent was obtained for a declot procedure. The left upper arm was prepped and draped in a sterile fashion. Maximal barrier sterile technique was utilized including caps, mask, sterile gowns, sterile gloves, sterile drape, hand hygiene and skin antiseptic.  The skin was anesthetized with 1% lidocaine. The fistula was accessed using a 21 gauge needle towards the central venous system with ultrasound guidance.  Ultrasound images were obtained for documentation. Micropuncture catheter was placed. A 6-French vascular sheath was placed.  A Kumpe catheter was advanced in the central veins using a Glidewire.  Central  venogram was performed.  The catheter was pulled back as TPA was infused within the cephalic vein thrombus.  Fluoroscopic images were saved for documentation. A Bentson wire was placed centrally and the cephalic vein was treated with the AngioJet thrombectomy device. The cephalic vein was also angioplastied with a 6 mm x 40 mm balloon.  Thrombus was treated again with the AngioJet device and the PTD thrombectomy device.  Using ultrasound guidance, a 21 gauge needle was directed into the vein towards the arterial anastomosis.  Micropuncture dilator set was placed and a 6 [place] vascular sheath was placed. Although there was already flow from the arterial system, a Fogarty balloon was pulled through the vein in order to dislodge thrombus. There was improved flow within the fistula after using the Fogarty balloon.  The central cephalic vein was angioplastied with 6 mm x 40 mm balloon.  At this point, there was flow throughout the fistula.  However, the flow was only temporary.  The central cephalic vein and mid humeral cephalic vein required angioplasty multiple times  in order to keep the cephalic vein patent.   At the end of the procedure, there was a pulse within the cephalic vein but poor outflow.  The vascular sheaths were removed with pursestring sutures.  Findings:The left cephalic vein fistula anastomosis is near the elbow.  The vein was patent near the arterial anastomosis but occluded in the mid and upper arm.  The central cephalic vein was also patent.  A large amount of the thrombus was removed using the AngioJet and mechanical thrombectomy device.  Eventually, there was some flow throughout the fistula after balloon angioplasty of a tight stenosis in the central cephalic vein and recurrent stenosis in the mid humeral cephalic vein. At the end of the procedure, there was still a large amount of thrombus within the aneurysmal segment of the mid humeral cephalic vein.  Partial recanalization of the upper left  arm cephalic vein.  Impression:Declot of the left upper extremity fistula.  There is improved flow through the fistula but the outflow is tenuous and high risk to occlude again.  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  Discussed the declot procedure with Dr. Hyman Hopes.   Recommend trying the fistula for dialysis.  If the fistula occludes again, recommend a catheter and surgical consultation for a new access.  Original Report Authenticated By: Richarda Overlie, M.D.   Ir Fluoro Guide Cv Line Left  08/20/2011  *RADIOLOGY REPORT*  Clinical data: End-stage renal disease.  Occluded arm graft.  Needs access for hemodialysis.  TUNNELED HEMODIALYSIS CATHETER PLACEMENT WITH ULTRASOUND AND FLUOROSCOPIC GUIDANCE:  Comparison: None  Technique: The procedure, risks, benefits, and alternatives were explained to the patient.  Questions regarding the procedure were encouraged and answered.  The patient understands and consents to the procedure. The patient was on adequate   antibiotic coverage, so no additional preprocedural antibiotics were administered. Ultrasound suggested occlusion of the right IJ vein at the thoracic inlet.    Patency of the left IJ vein was confirmed with ultrasound with image documentation. An appropriate skin site was determined. Region was prepped using maximum barrier technique including cap and mask, sterile gown, sterile gloves, large sterile sheet, and Chlorhexidine   as cutaneous antisepsis. The region was infiltrated locally with 1% lidocaine. Under real-time ultrasound guidance, the left IJ vein was accessed with a 21 gauge micropuncture needle; the needle tip within the vein was confirmed with ultrasound image documentation.   Needle exchanged over the 018 guidewire for transitional dilator, which allowed advancement of a Benson wire into the IVC. Over this, an MPA catheter was advanced. A Hemosplit 23 hemodialysis catheter was tunneled from the right anterior chest wall  approach to the left IJ dermatotomy site. The MPA catheter was exchanged over an Amplatz wire for serial vascular dilators which allow placement of a peel-away sheath, through which the catheter was advanced under intermittent fluoroscopy, positioned with its tips in the proximal and midright atrium. Spot chest radiograph confirms good catheter position. No pneumothorax. Catheter was flushed and primed per protocol. Catheter secured externally with O Prolene sutures. The left IJ   dermatotomy site was closed with Dermabond. No immediate complication.  IMPRESSION:  1. Technically successful placement of tunneled left IJ hemodialysis catheter with ultrasound and fluoroscopic guidance. Ready for routine use.  Access management: Remains approachable for percutaneous intervention as needed.  Original Report Authenticated By: Osa Craver, M.D.   Ir Av Dialysis Graft Declot  08/18/2011  *RADIOLOGY REPORT*  Clinical history:End-stage renal disease and clotted  left upper extremity cephalic vein fistula.  PROCEDURE(S): DIALYSIS  FISTULA DECLOT; CEPHALIC VEIN ANGIOPLASTY; ULTRASOUND GUIDANCE FOR VASCULAR ACCESS  Physician: Rachelle Hora. Henn, MD  Medications:Heparin 3000 units, TPA 2 mg  Fluoroscopy time: 22.5 minutes  Contrast: 100 ml Omnipaque 300  Procedure:Informed consent was obtained for a declot procedure. The left upper arm was prepped and draped in a sterile fashion. Maximal barrier sterile technique was utilized including caps, mask, sterile gowns, sterile gloves, sterile drape, hand hygiene and skin antiseptic.  The skin was anesthetized with 1% lidocaine. The fistula was accessed using a 21 gauge needle towards the central venous system with ultrasound guidance.  Ultrasound images were obtained for documentation. Micropuncture catheter was placed. A 6-French vascular sheath was placed.  A Kumpe catheter was advanced in the central veins using a Glidewire.  Central venogram was performed.  The catheter was  pulled back as TPA was infused within the cephalic vein thrombus.  Fluoroscopic images were saved for documentation. A Bentson wire was placed centrally and the cephalic vein was treated with the AngioJet thrombectomy device. The cephalic vein was also angioplastied with a 6 mm x 40 mm balloon.  Thrombus was treated again with the AngioJet device and the PTD thrombectomy device.  Using ultrasound guidance, a 21 gauge needle was directed into the vein towards the arterial anastomosis.  Micropuncture dilator set was placed and a 6 [place] vascular sheath was placed. Although there was already flow from the arterial system, a Fogarty balloon was pulled through the vein in order to dislodge thrombus. There was improved flow within the fistula after using the Fogarty balloon.  The central cephalic vein was angioplastied with 6 mm x 40 mm balloon.  At this point, there was flow throughout the fistula.  However, the flow was only temporary.  The central cephalic vein and mid humeral cephalic vein required angioplasty multiple times in order to keep the cephalic vein patent.   At the end of the procedure, there was a pulse within the cephalic vein but poor outflow.  The vascular sheaths were removed with pursestring sutures.  Findings:The left cephalic vein fistula anastomosis is near the elbow.  The vein was patent near the arterial anastomosis but occluded in the mid and upper arm.  The central cephalic vein was also patent.  A large amount of the thrombus was removed using the AngioJet and mechanical thrombectomy device.  Eventually, there was some flow throughout the fistula after balloon angioplasty of a tight stenosis in the central cephalic vein and recurrent stenosis in the mid humeral cephalic vein. At the end of the procedure, there was still a large amount of thrombus within the aneurysmal segment of the mid humeral cephalic vein.  Partial recanalization of the upper left arm cephalic vein.  Impression:Declot of  the left upper extremity fistula.  There is improved flow through the fistula but the outflow is tenuous and high risk to occlude again.  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  Discussed the declot procedure with Dr. Hyman Hopes.   Recommend trying the fistula for dialysis.  If the fistula occludes again, recommend a catheter and surgical consultation for a new access.  Original Report Authenticated By: Richarda Overlie, M.D.   Ir Angio Av Shunt Addl Access  08/18/2011  *RADIOLOGY REPORT*  Clinical history:End-stage renal disease and clotted left upper extremity cephalic vein fistula.  PROCEDURE(S): DIALYSIS  FISTULA DECLOT; CEPHALIC VEIN ANGIOPLASTY; ULTRASOUND GUIDANCE FOR VASCULAR ACCESS  Physician: Rachelle Hora. Lowella Dandy, MD  Medications:Heparin 3000 units, TPA 2 mg  Fluoroscopy time: 22.5 minutes  Contrast: 100 ml Omnipaque 300  Procedure:Informed consent was obtained for a declot procedure. The left upper arm was prepped and draped in a sterile fashion. Maximal barrier sterile technique was utilized including caps, mask, sterile gowns, sterile gloves, sterile drape, hand hygiene and skin antiseptic.  The skin was anesthetized with 1% lidocaine. The fistula was accessed using a 21 gauge needle towards the central venous system with ultrasound guidance.  Ultrasound images were obtained for documentation. Micropuncture catheter was placed. A 6-French vascular sheath was placed.  A Kumpe catheter was advanced in the central veins using a Glidewire.  Central venogram was performed.  The catheter was pulled back as TPA was infused within the cephalic vein thrombus.  Fluoroscopic images were saved for documentation. A Bentson wire was placed centrally and the cephalic vein was treated with the AngioJet thrombectomy device. The cephalic vein was also angioplastied with a 6 mm x 40 mm balloon.  Thrombus was treated again with the AngioJet device and the PTD thrombectomy device.  Using ultrasound guidance, a  21 gauge needle was directed into the vein towards the arterial anastomosis.  Micropuncture dilator set was placed and a 6 [place] vascular sheath was placed. Although there was already flow from the arterial system, a Fogarty balloon was pulled through the vein in order to dislodge thrombus. There was improved flow within the fistula after using the Fogarty balloon.  The central cephalic vein was angioplastied with 6 mm x 40 mm balloon.  At this point, there was flow throughout the fistula.  However, the flow was only temporary.  The central cephalic vein and mid humeral cephalic vein required angioplasty multiple times in order to keep the cephalic vein patent.   At the end of the procedure, there was a pulse within the cephalic vein but poor outflow.  The vascular sheaths were removed with pursestring sutures.  Findings:The left cephalic vein fistula anastomosis is near the elbow.  The vein was patent near the arterial anastomosis but occluded in the mid and upper arm.  The central cephalic vein was also patent.  A large amount of the thrombus was removed using the AngioJet and mechanical thrombectomy device.  Eventually, there was some flow throughout the fistula after balloon angioplasty of a tight stenosis in the central cephalic vein and recurrent stenosis in the mid humeral cephalic vein. At the end of the procedure, there was still a large amount of thrombus within the aneurysmal segment of the mid humeral cephalic vein.  Partial recanalization of the upper left arm cephalic vein.  Impression:Declot of the left upper extremity fistula.  There is improved flow through the fistula but the outflow is tenuous and high risk to occlude again.  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  Discussed the declot procedure with Dr. Hyman Hopes.   Recommend trying the fistula for dialysis.  If the fistula occludes again, recommend a catheter and surgical consultation for a new access.  Original Report  Authenticated By: Richarda Overlie, M.D.   Ir US Guide Vasc Access Left  08/20/2011  *RADIOLOGY REPORT*  Clinical data: End-stage renal disease.  Occluded arm graft.  Needs access for hemodialysis.  TUNNELED HEMODIALYSIS CATHETER PLACEMENT WITH ULTRASOUND AND FLUOROSCOPIC GUIDANCE:  Comparison: None  Technique: The procedure, risks, benefits, and alternatives were explained to the patient.  Questions regarding the procedure were encouraged and answered.  The patient understands and consents to the procedure. The  patient was on adequate   antibiotic coverage, so no additional preprocedural antibiotics were administered. Ultrasound suggested occlusion of the right IJ vein at the thoracic inlet.    Patency of the left IJ vein was confirmed with ultrasound with image documentation. An appropriate skin site was determined. Region was prepped using maximum barrier technique including cap and mask, sterile gown, sterile gloves, large sterile sheet, and Chlorhexidine   as cutaneous antisepsis. The region was infiltrated locally with 1% lidocaine. Under real-time ultrasound guidance, the left IJ vein was accessed with a 21 gauge micropuncture needle; the needle tip within the vein was confirmed with ultrasound image documentation.   Needle exchanged over the 018 guidewire for transitional dilator, which allowed advancement of a Benson wire into the IVC. Over this, an MPA catheter was advanced. A Hemosplit 23 hemodialysis catheter was tunneled from the right anterior chest wall approach to the left IJ dermatotomy site. The MPA catheter was exchanged over an Amplatz wire for serial vascular dilators which allow placement of a peel-away sheath, through which the catheter was advanced under intermittent fluoroscopy, positioned with its tips in the proximal and midright atrium. Spot chest radiograph confirms good catheter position. No pneumothorax. Catheter was flushed and primed per protocol. Catheter secured externally with O  Prolene sutures. The left IJ   dermatotomy site was closed with Dermabond. No immediate complication.  IMPRESSION:  1. Technically successful placement of tunneled left IJ hemodialysis catheter with ultrasound and fluoroscopic guidance. Ready for routine use.  Access management: Remains approachable for percutaneous intervention as needed.  Original Report Authenticated By: Osa Craver, M.D.   Ir US Guide Vasc Access Left  08/18/2011  *RADIOLOGY REPORT*  Clinical history:End-stage renal disease and clotted left upper extremity cephalic vein fistula.  PROCEDURE(S): DIALYSIS  FISTULA DECLOT; CEPHALIC VEIN ANGIOPLASTY; ULTRASOUND GUIDANCE FOR VASCULAR ACCESS  Physician: Rachelle Hora. Henn, MD  Medications:Heparin 3000 units, TPA 2 mg  Fluoroscopy time: 22.5 minutes  Contrast: 100 ml Omnipaque 300  Procedure:Informed consent was obtained for a declot procedure. The left upper arm was prepped and draped in a sterile fashion. Maximal barrier sterile technique was utilized including caps, mask, sterile gowns, sterile gloves, sterile drape, hand hygiene and skin antiseptic.  The skin was anesthetized with 1% lidocaine. The fistula was accessed using a 21 gauge needle towards the central venous system with ultrasound guidance.  Ultrasound images were obtained for documentation. Micropuncture catheter was placed. A 6-French vascular sheath was placed.  A Kumpe catheter was advanced in the central veins using a Glidewire.  Central venogram was performed.  The catheter was pulled back as TPA was infused within the cephalic vein thrombus.  Fluoroscopic images were saved for documentation. A Bentson wire was placed centrally and the cephalic vein was treated with the AngioJet thrombectomy device. The cephalic vein was also angioplastied with a 6 mm x 40 mm balloon.  Thrombus was treated again with the AngioJet device and the PTD thrombectomy device.  Using ultrasound guidance, a 21 gauge needle was directed into the vein  towards the arterial anastomosis.  Micropuncture dilator set was placed and a 6 [place] vascular sheath was placed. Although there was already flow from the arterial system, a Fogarty balloon was pulled through the vein in order to dislodge thrombus. There was improved flow within the fistula after using the Fogarty balloon.  The central cephalic vein was angioplastied with 6 mm x 40 mm balloon.  At this point, there was flow throughout the fistula.  However, the flow was only temporary.  The central cephalic vein and mid humeral cephalic vein required angioplasty multiple times in order to keep the cephalic vein patent.   At the end of the procedure, there was a pulse within the cephalic vein but poor outflow.  The vascular sheaths were removed with pursestring sutures.  Findings:The left cephalic vein fistula anastomosis is near the elbow.  The vein was patent near the arterial anastomosis but occluded in the mid and upper arm.  The central cephalic vein was also patent.  A large amount of the thrombus was removed using the AngioJet and mechanical thrombectomy device.  Eventually, there was some flow throughout the fistula after balloon angioplasty of a tight stenosis in the central cephalic vein and recurrent stenosis in the mid humeral cephalic vein. At the end of the procedure, there was still a large amount of thrombus within the aneurysmal segment of the mid humeral cephalic vein.  Partial recanalization of the upper left arm cephalic vein.  Impression:Declot of the left upper extremity fistula.  There is improved flow through the fistula but the outflow is tenuous and high risk to occlude again.  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  Discussed the declot procedure with Dr. Hyman Hopes.   Recommend trying the fistula for dialysis.  If the fistula occludes again, recommend a catheter and surgical consultation for a new access.  Original Report Authenticated By: Richarda Overlie, M.D.   Dg  Chest Port 1 View  08/30/2011  *RADIOLOGY REPORT*  Clinical Data: Check endotracheal tube placement.  Pneumonia.  PORTABLE CHEST - 1 VIEW  Comparison: Portable chest 08/28/2011.  Findings: The patient has been extubated.  The NG tube was removed as well.  A left IJ dialysis catheter is stable.  There is slight improved aeration of the left lung.  Right-sided airspace disease is not significantly changed.  Bilateral pleural effusions are evident, right greater than left.  Mild cardiomegaly is stable. Pancreatic calcifications are again noted.  IMPRESSION:  1.  Slight improved aeration of the left lung. 2.  Right-sided airspace disease is stable. 3.  Slight increase in bilateral pleural effusions, right greater than left. 4.  The patient has been extubated.  The NG tube was removed as well. 5.  The appearance remains concerning for bilateral pneumonia.  Original Report Authenticated By: Jamesetta Orleans. MATTERN, M.D.   Dg Chest Port 1 View  08/28/2011  *RADIOLOGY REPORT*  Clinical Data: Intubated patient.  PORTABLE CHEST - 1 VIEW  Comparison: Portable chest 08/27/2011.  Findings: Support tubes and lines are unchanged.  Extensive bilateral airspace disease persists.  Aeration in the right chest has worsened.  Right greater than left pleural effusions noted.  IMPRESSION: Extensive bilateral airspace disease shows worsening on the right.  Original Report Authenticated By: Bernadene Bell. D'ALESSIO, M.D.   Dg Chest Portable 1 View  08/27/2011  *RADIOLOGY REPORT*  Clinical Data: Line placement.  PORTABLE CHEST - 1 VIEW  Comparison: 08/26/2011.  Findings: The support apparatus is in good position.  The endotracheal tube is now 4.6 cm above the carina.  Interval progressive airspace consolidation in the left lung could reflect worsening edema or infiltrate.  Small effusions are stable.  IMPRESSION:  1.  Support apparatus in good position without complicating features. 2.  Worsening airspace consolidation, left greater than right  could reflect worsening edema or infiltrate. 3.  Stable small effusions.  Original Report Authenticated By: P. Loralie Champagne, M.D.   Dg Chest [place]  1 View  08/26/2011  *RADIOLOGY REPORT*  Clinical Data: Endotracheal tube placement.  PORTABLE CHEST - 1 VIEW  Comparison: 08/16/2011.  Findings: The endotracheal tube is right at the right mainstem bronchus and should be retracted 3 cm.  The dialysis catheter is stable.  The NG tube is in the stomach.  There is pulmonary edema, areas of atelectasis and a right pleural effusion.  IMPRESSION: The endotracheal tube is at the right mainstem bronchus and should be retracted 3 cm.  Original Report Authenticated By: P. Loralie Champagne, M.D.   Dg Chest Port 1 View  08/16/2011  *RADIOLOGY REPORT*  Clinical Data: Pneumonia.  Pleural effusion.  PORTABLE CHEST - 1 VIEW  Comparison: 08/15/2011  Findings: Nasogastric tube extends into the stomach.  Cardiomegaly noted with mild interstitial accentuation and increased obscuration of the right hemidiaphragm primarily ascribed to pleural effusion and associated passive atelectasis.  There is mild atelectasis at the left lung base along with a small left pleural effusion.  Lung volumes are slightly worsened compared to yesterday's exam.  Atherosclerotic calcification of the aortic arch noted. Glenohumeral degenerative findings noted.  IMPRESSION:  1.  Increase in apparent size of the right pleural effusion with associated passive atelectasis. 2.  Small left pleural effusion with left basilar atelectasis also noted. 3.  Cardiomegaly with minimal interstitial accentuation.  Original Report Authenticated By: Dellia Cloud, M.D.   Dg Chest Port 1 View  08/15/2011  *RADIOLOGY REPORT*  Clinical Data: Status post right thoracentesis.  PORTABLE CHEST - 1 VIEW  Comparison: Earlier the same day  Findings: 1556 hours.  Right pleural effusion has decreased in the interval.  No evidence for pneumothorax.  There is some subsegmental  atelectasis at the right base. The NG tube passes into the stomach although the distal tip position is not included on the film. The cardiopericardial silhouette is enlarged.  Diffuse interstitial opacity is improved in the interval, suggesting resolving edema.  IMPRESSION: No evidence for pneumothorax after right thoracentesis.  Original Report Authenticated By: ERIC A. MANSELL, M.D.   Dg Chest Port 1 View  08/15/2011  *RADIOLOGY REPORT*  Clinical Data: Shortness of breath and pleural effusion.  PORTABLE CHEST - 1 VIEW  Comparison: 08/14/2011  Findings: The NG tube passes into the stomach although the distal tip position is not included on the film. The cardiopericardial silhouette is enlarged.  Vascular congestion with diffuse interstitial opacity is consistent with edema.  There is bibasilar atelectasis and right pleural effusion.  IMPRESSION: No substantial change.  Cardiomegaly with edema and right pleural effusion.  Original Report Authenticated By: ERIC A. MANSELL, M.D.   Portable Chest Xray In Am  08/14/2011  *RADIOLOGY REPORT*  Clinical Data: Respiratory failure  PORTABLE CHEST - 1 VIEW  Comparison: Aug 13, 2011  Findings: There is cardiomegaly and pulmonary edema, as well as a moderate sized right pleural effusion.  Overall, the findings  have not significantly changed.  The nasogastric tube tip courses off the inferior film.  IMPRESSION: No significant change in cardiomegaly, pulmonary edema, moderate sized right pleural effusion.  Original Report Authenticated By: Brandon Melnick, M.D.   Dg Chest Port 1 View  08/13/2011  *RADIOLOGY REPORT*  Clinical Data: Unresponsive.  Shortness of breath and abdominal distention.  PORTABLE CHEST - 1 VIEW  Comparison: 07/31/2011.  Findings: Trachea is midline.  Heart size stable.  There is bilateral air space disease, right greater than left, with progression from 07/31/2011.  Associated right pleural effusion. There may be a tiny  left pleural effusion.   IMPRESSION: Worsening bilateral air space disease with an associated right pleural effusion.  Question tiny left pleural effusion.  Congestive heart failure is likely.  Original Report Authenticated By: Reyes Ivan, M.D.   Dg Abd Portable 1v  08/16/2011  *RADIOLOGY REPORT*  Clinical Data: Small bowel obstruction  PORTABLE ABDOMEN - 1 VIEW  Comparison: 08/15/2011  Findings: Vascular calcifications and rim calcified left renal lesions noted.  Nasogastric tube extends into the stomach.  The right femoral line projects over the pelvis.  Left pelvic skin clips noted.  Currently most of the bowel is gasless.  The visualized loops of bowel do not demonstrate bowel dilatation.  Lumbar spondylosis is again noted. Pleural effusions noted at the lung bases, with cardiomegaly.  IMPRESSION:  1.  No dilated bowel is currently visible, although much of the bowel is gasless. 2.  Small right and smaller left pleural effusions. 3.  Calcified left renal cysts or masses. 4.  Vascular calcifications. 5.  Right femoral line tip projects over the right sacral ala.  Original Report Authenticated By: Dellia Cloud, M.D.   Dg Abd Portable 1v  08/15/2011  *RADIOLOGY REPORT*  Clinical Data: Small bowel obstruction.  PORTABLE ABDOMEN - 1 VIEW  Comparison: Plain films and CT abdomen and pelvis 08/13/2011.  Findings: New NG tube and right femoral catheter are in place. Surgical staples to the left of midline again noted.  Gas filled small bowel loops are again seen but appear less numerous and prominent.  Bilateral upper quadrant calcifications are noted and consistent with renal calcifications seen on CT.  IMPRESSION: Mild improvement small bowel obstruction.  Original Report Authenticated By: Bernadene Bell. Maricela Curet, M.D.   Dg Abd Portable 1v  08/13/2011  *RADIOLOGY REPORT*  Clinical Data: Unresponsive.  Shortness of breath and abdominal distention.  PORTABLE ABDOMEN - 1 VIEW  Comparison: None.  Findings: There is mild gaseous  distention of small bowel loops. Some colonic gas and stool are seen.  Surgical skin staples overlie the left lower quadrant.  Peripherally calcified structure in the left paraspinal region has the shape of the left kidney.  Additional tubular calcifications are seen in the left upper quadrant.  There may be small calcifications in the right paraspinal region.  IMPRESSION:  1.  Mild small bowel distention with possible wall thickening.  CT abdomen pelvis with contrast would likely be helpful in further evaluation, as clinically indicated. 2.  Calcified structures in the left upper quadrant, and likely right paraspinal region, as discussed above.  These could also be further assessed with CT.  Original Report Authenticated By: Reyes Ivan, M.D.   Dg Abd Portable 2v  08/28/2011  *RADIOLOGY REPORT*  Clinical Data: Abdominal distention.  Question ileus or small bowel obstruction.  PORTABLE ABDOMEN - 2 VIEW  Comparison: CT abdomen pelvis 08/27/2011 and plain film of the abdomen 08/20/2011.  Findings: The abdomen is nearly gasless.  NG tube and right groin catheter are in place.  Calcified cystic lesion in the left upper quadrant as seen on CT scan again noted.  Multiple calcifications in the spleen are also again seen.  IMPRESSION: The abdomen is nearly gasless making assessment of small bowel difficult.  No definite acute finding.  Original Report Authenticated By: Bernadene Bell. Maricela Curet, M.D.    Medications: I have reviewed the patient's current medications. Scheduled Meds:    . amLODipine  10 mg Oral Daily  . antiseptic oral rinse  15 mL Mouth Rinse q12n4p  .  chlorhexidine  15 mL Mouth Rinse BID  . cinacalcet  60 mg Oral Q breakfast  . darbepoetin (ARANESP) injection - DIALYSIS  100 mcg Intravenous Q Wed-HD  . doxercalciferol  6 mcg Intravenous Q M,W,F-HD  . famotidine  20 mg Oral QHS  . feeding supplement (NEPRO CARB STEADY)  237 mL Oral Q24H  . metoprolol tartrate  100 mg Oral BID  .  multivitamin  1 tablet Oral QHS  . sodium chloride  10-40 mL Intracatheter Q12H  . vancomycin  125 mg Oral Q6H   Continuous Infusions:  PRN Meds:.sodium chloride, heparin, morphine injection, oxyCODONE, sodium chloride, DISCONTD: heparin, DISCONTD: heparin Assessment/Plan: Patient Active Hospital Problem List: Septic shock (08/27/2011)   Assessment: Septic shock was associated with fulminant C. difficile infection and hospital-acquired pneumonia. The patient is fully recovered from his septic shock is no hemodynamically stable.diarrhea.    Chronic diastolic CHF (congestive heart failure) (07/23/2010)   Assessment: Clinically well compensated     ESRD (end stage renal disease) ()   Assessment: Hemodialysis dependent and tolerating well by mouth     C. difficile colitis (08/14/2011)   Assessment:  The patient had 5 days of IV Flagyl and  8/14 days of by mouth vancomycin . Based on clinical progression, Flagyl has ben discontinued and patient continues to improve.  Will complete a total of 14 days of Vancomycin.  CAD (coronary artery disease) (08/14/2011)   Assessment: No chest pain. Information noted regarding cardiology evaluation.    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 Vascular surgery to identify exact date.  Constipation (09/06/2028)   Assessment: Patient has had constipation a few days. In light of the patient's diarrhea that he had a C. difficile I am hesitant to order laxatives for this patient. I will give the patient Senokot daily and when necessary basis.   LOS: 12 days

## 2011-09-08 ENCOUNTER — Inpatient Hospital Stay (HOSPITAL_COMMUNITY): Payer: Medicare Other

## 2011-09-08 DIAGNOSIS — I70269 Atherosclerosis of native arteries of extremities with gangrene, unspecified extremity: Secondary | ICD-10-CM

## 2011-09-08 DIAGNOSIS — A0472 Enterocolitis due to Clostridium difficile, not specified as recurrent: Secondary | ICD-10-CM

## 2011-09-08 DIAGNOSIS — N186 End stage renal disease: Secondary | ICD-10-CM

## 2011-09-08 DIAGNOSIS — K5900 Constipation, unspecified: Secondary | ICD-10-CM

## 2011-09-08 MED ORDER — VANCOMYCIN HCL IN DEXTROSE 1-5 GM/200ML-% IV SOLN
1000.0000 mg | INTRAVENOUS | Status: AC
  Administered 2011-09-09: 1000 mg via INTRAVENOUS
  Filled 2011-09-08 (×2): qty 200

## 2011-09-08 MED ORDER — MIDAZOLAM HCL 2 MG/2ML IJ SOLN: 1.0000 mg | INTRAMUSCULAR | Status: DC | PRN

## 2011-09-08 MED ORDER — OXYCODONE HCL 5 MG PO TABS
ORAL_TABLET | ORAL | Status: AC
  Administered 2011-09-08: 5 mg via ORAL
  Filled 2011-09-08: qty 1

## 2011-09-08 MED ORDER — POLYETHYLENE GLYCOL 3350 17 G PO PACK
17.0000 g | PACK | Freq: Once | ORAL | Status: AC
  Administered 2011-09-08: 17 g via ORAL
  Filled 2011-09-08: qty 1

## 2011-09-08 MED ORDER — DOXERCALCIFEROL 4 MCG/2ML IV SOLN
INTRAVENOUS | Status: AC
  Administered 2011-09-08: 6 ug via INTRAVENOUS
  Filled 2011-09-08: qty 4

## 2011-09-08 MED ORDER — MORPHINE SULFATE 4 MG/ML IJ SOLN
INTRAMUSCULAR | Status: AC
  Administered 2011-09-08: 3 mg via INTRAVENOUS
  Filled 2011-09-08: qty 1

## 2011-09-08 MED ORDER — FENTANYL CITRATE 0.05 MG/ML IJ SOLN: 50.0000 ug | INTRAMUSCULAR | Status: DC | PRN

## 2011-09-08 NOTE — Progress Notes (Signed)
Subjective:  No complaints. Plan is for fem pop bypass tomorrow.  Already had HD this AM, tol well.   Objective:    Vital signs in last 24 hours: Filed Vitals:   09/08/11 0900 09/08/11 0918 09/08/11 0925 09/08/11 1007  BP: 135/75 133/74 154/82 140/63  Pulse: 69 68 98 78  Temp:    98.8 F (37.1 C)  TempSrc:    Oral  Resp:    18  Height:      Weight:   68.8 kg (151 lb 10.8 oz)   SpO2:    93%   Weight change: 0.2 kg (7.1 oz)  Intake/Output Summary (Last 24 hours) at 09/08/11 1308 Last data filed at 09/08/11 0925  Gross per 24 hour  Intake    220 ml  Output   2850 ml  Net  -2630 ml   Labs: Basic Metabolic Panel:  Lab 09/05/11 1610 09/03/11 1011  NA 136 137  K 3.6 4.2  CL 100 101  CO2 27 24  GLUCOSE 87 51*  BUN 14 20  CREATININE 4.36* 4.75*  ALB -- --  CALCIUM 7.9* 8.1*  PHOS 3.9 4.1   Liver Function Tests:  Lab 09/05/11 0616 09/03/11 1011  AST -- --  ALT -- --  ALKPHOS -- --  BILITOT -- --  PROT -- --  ALBUMIN 1.5* 1.7*   No results found for this basename: LIPASE:3,AMYLASE:3 in the last 168 hours No results found for this basename: AMMONIA:3 in the last 168 hours CBC:  Lab 09/05/11 0616 09/03/11 1011  WBC 7.3 7.4  NEUTROABS -- --  HGB 8.5* 8.8*  HCT 27.0* 28.1*  MCV 89.1 90.1  PLT 190 159   Cardiac Enzymes: No results found for this basename: CKTOTAL:5,CKMB:5,CKMBINDEX:5,TROPONINI:5 in the last 168 hours CBG:  Lab 09/04/11 0741 09/03/11 2132 09/03/11 1602 09/03/11 1554 09/03/11 1528  GLUCAP 95 113* 68* 76 53*    Iron Studies: No results found for this basename: IRON:30,TIBC:30,SATURATION RATIOS:30,TRANSFERRIN:30,FERRITIN:30 in the last 168 hours  Physical Exam:  Blood pressure 140/63, pulse 78, temperature 98.8 F (37.1 C), temperature source Oral, resp. rate 18, height 5\' 6"  (1.676 m), weight 68.8 kg (151 lb 10.8 oz), SpO2 93.00%.  RUE:[id number] in bed NWG:NFAOZ RRR, normal S1 and S2  Resp:CTA bilaterally, no rales/rhonchi  HYQ:MVHQ, flat,  NT, BS normal  ION:GEXB LE forefoot ischemia noted- no LE edema  Impression/Plan 1. Sepsis Syndrome with C diff colitis/PNA: Improved on antibiotic therapy with Primaxin, vancomycin and metronidazole- now on PO vanco only for C diff.  2. C Diff colitis: Resolved and now on PO vancomycin  3. ESRD: continue MWF HD. 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  Phos is 3.9 5. Left foot ischemia: await further management by [facility]- anticipated fempop tomorrow   [name]

## 2011-09-08 NOTE — Progress Notes (Signed)
PT/OT Cancellation Note  Treatment cancelled today due to patient receiving procedure or test at HD. Will re-attempt as time allows.  Alonzo Loving, OTR/L Pager [phone] 09/08/2011, 8:10 AM

## 2011-09-08 NOTE — Progress Notes (Signed)
[facility]  Subjective  -  No complaints today No diarrhea   Physical Exam:  Ischemic changes to all toes on left foot Gen:  NAD CV:  RRR Abd: soft  .Assessment/Plan:  Left foot ischemia  The patients bypass has been delayed secondary to sepsis form C Diff which has now resolved.  The patient has had progression of the ischemia on his left foot, now involving all of his toes.   I discussed proceeding with left femoral popliteal bypass tomorrow and likely a TMA later in the week.  We discussed the risks and benefits, including the risk of wound complications, infection, cardiopulmonary complications.  He is eager to proceed.  Jordann Grime IV, V. WELLS 09/08/2011 8:23 AM --  Filed Vitals:   09/08/11 0730  BP: 155/77  Pulse: 68  Temp:   Resp:     Intake/Output Summary (Last 24 hours) at 09/08/11 0823 Last data filed at 09/07/11 1800  Gross per 24 hour  Intake    220 ml  Output      0 ml  Net    220 ml     Laboratory CBC    Component Value Date/Time   WBC 7.3 09/05/2011 0616   HGB 8.5* 09/05/2011 0616   HCT 27.0* 09/05/2011 0616   PLT 190 09/05/2011 0616    BMET    Component Value Date/Time   NA 136 09/05/2011 0616   K 3.6 09/05/2011 0616   CL 100 09/05/2011 0616   CO2 27 09/05/2011 0616   GLUCOSE 87 09/05/2011 0616   BUN 14 09/05/2011 0616   CREATININE 4.36* 09/05/2011 0616   CALCIUM 7.9* 09/05/2011 0616   GFRNONAA 13* 09/05/2011 0616   GFRAA 15* 09/05/2011 0616    COAG Lab Results  Component Value Date   INR 1.38 08/27/2011   INR 1.54* 08/19/2011   INR 1.62* 08/13/2011   No results found for this basename: PTT    Antibiotics Anti-infectives     Start     Dose/Rate Route Frequency Ordered Stop   09/09/11 0000   vancomycin (VANCOCIN) IVPB 1000 mg/200 mL premix        1,000 mg 200 mL/hr over 60 Minutes Intravenous To Surgery 09/08/11 0823 09/10/11 0000   08/31/11 1830   vancomycin (VANCOCIN) 50 mg/mL oral solution 125 mg        125 mg Oral 4 times per day 08/31/11 1821     08/30/11 1200   vancomycin (VANCOCIN) 750 mg in sodium chloride 0.9 % 150 mL IVPB  Status:  Discontinued        750 mg 150 mL/hr over 60 Minutes Intravenous Every T-Th-Sa (Hemodialysis) 08/29/11 0939 08/31/11 1357   08/29/11 1200   vancomycin (VANCOCIN) 750 mg in sodium chloride 0.9 % 150 mL IVPB  Status:  Discontinued        750 mg 150 mL/hr over 60 Minutes Intravenous Every M-W-F (Hemodialysis) 08/27/11 1157 08/29/11 0939   08/28/11 1530   vancomycin (VANCOCIN) 750 mg in sodium chloride 0.9 % 150 mL IVPB        750 mg 150 mL/hr over 60 Minutes Intravenous Once in dialysis 08/28/11 1301 08/28/11 2014   08/28/11 1200   vancomycin (VANCOCIN) 500 mg in sodium chloride irrigation 0.9 % 100 mL ENEMA  Status:  Discontinued     Comments: Retain for 1 hour      500 mg Rectal 4 times per day 08/28/11 1136 08/31/11 1821   08/27/11 2200     imipenem-cilastatin (PRIMAXIN) 250 mg in sodium chloride 0.9 % 100 mL IVPB        250 mg 200 mL/hr over 30 Minutes Intravenous Every 12 hours 08/27/11 0255 09/03/11 2359   08/27/11 1500   vancomycin (VANCOCIN) 500 mg in sodium chloride irrigation 0.9 % 100 mL ENEMA  Status:  Discontinued     Comments: Retain for 1 hour      500 mg Rectal Every 8 hours 08/27/11 1404 08/28/11 1136   08/27/11 1200   metroNIDAZOLE (FLAGYL) IVPB 500 mg  Status:  Discontinued        500 mg 100 mL/hr over 60 Minutes Intravenous Every 8 hours 08/27/11 0255 09/06/11 0825   08/27/11 1200   vancomycin (VANCOCIN) 750 mg in sodium chloride 0.9 % 150 mL IVPB  Status:  Discontinued        750 mg 150 mL/hr over 60 Minutes Intravenous Every M-W-F (Hemodialysis) 08/27/11 0255 08/27/11 1157   08/27/11 0600   vancomycin (VANCOCIN) 50 mg/mL oral solution 250 mg  Status:  Discontinued        250 mg Oral 4 times per day 08/27/11 0255 08/27/11 1404   08/27/11 0400   vancomycin (VANCOCIN) 500 mg in sodium chloride 0.9 % 100 mL IVPB        500 mg 100  mL/hr over 60 Minutes Intravenous NOW 08/27/11 0255 08/27/11 0530   08/27/11 0230   imipenem-cilastatin (PRIMAXIN) 500 mg in sodium chloride 0.9 % 100 mL IVPB        500 mg 200 mL/hr over 30 Minutes Intravenous  Once 08/27/11 0220 08/27/11 0350   08/27/11 0230   metroNIDAZOLE (FLAGYL) IVPB 500 mg        500 mg 100 mL/hr over 60 Minutes Intravenous  Once 08/27/11 0220 08/27/11 0343   08/27/11 0230   vancomycin (VANCOCIN) IVPB 1000 mg/200 mL premix        1,000 mg 200 mL/hr over 60 Minutes Intravenous  Once 08/27/11 0220 08/27/11 0348   08/27/11 0129   metroNIDAZOLE (FLAGYL) 5-0.79 MG/ML-% IVPB     Comments: PREYER, ELIZABETH: cabinet override         08/27/11 0129 08/27/11 1329           V. Wells Sianne Tejada IV, M.D. [facility] Office: [phone] Pager:  [phone]  

## 2011-09-08 NOTE — Progress Notes (Signed)
Subjective: Patient no complaints today. He's scheduled for bypass tomorrow. Patient has had no bowel movement since 09/05/2011  Interval History: Vascular surgery plans left Fem-pop bypass tomorrow. Information noted from vascular surgery stating that the patient has been deemed by cardiology to be an acceptable risk for vascular surgery.  Objective: Filed Vitals:   09/08/11 0918 09/08/11 0925 09/08/11 1007 09/08/11 1818  BP: 133/74 154/82 140/63 137/58  Pulse: 68 98 78 67  Temp:   98.8 F (37.1 C) 98.8 F (37.1 C)  TempSrc:   Oral Oral  Resp:   18 18  Height:      Weight:  68.8 kg (151 lb 10.8 oz)    SpO2:   93% 93%   Weight change: 0.2 kg (7.1 oz)  Intake/Output Summary (Last 24 hours) at 09/08/11 1946 Last data filed at 09/08/11 1700  Gross per 24 hour  Intake    240 ml  Output   2850 ml  Net  -2610 ml    General: Alert, awake, oriented x3, in no acute distress.  HEENT: [place]/AT PEER[place], EOMI Neck: Trachea midline,  no masses, no thyromegal,y no JVD, no carotid bruit OROPHARYNX:  Moist, No exudate/ erythema/lesions.  Heart: Regular rate and rhythm, without murmurs, rubs, gallops, PMI non-displaced, no heaves or thrills on palpation.  Lungs: Clear to auscultation, no wheezing or rhonchi noted. No increased vocal fremitus resonant to percussion  Abdomen: Soft, nontender, nondistended, positive bowel sounds, no masses no hepatosplenomegaly noted..  Neuro: No focal neurological deficits noted  Musculoskeletal: No warm swelling or erythema around joints, no spinal tenderness noted. Extremities: Left foot is cool to touch and there is no drainage from foot.   Lab Results: No results found for this basename: NA:2,K:2,CL:2,CO2:2,GLUCOSE:2,BUN:2,CREATININE:2,CALCIUM:2,MG:2,PHOS:2 in the last 72 hours No results found for this basename: AST:2,ALT:2,ALKPHOS:2,BILITOT:2,PROT:2,ALBUMIN:2 in the last 72 hours No results found for this basename: LIPASE:2,AMYLASE:2 in the last 72 hours No  results found for this basename: WBC:2,NEUTROABS:2,HGB:2,HCT:2,MCV:2,PLT:2 in the last 72 hours No results found for this basename: CKTOTAL:3,CKMB:3,CKMBINDEX:3,TROPONINI:3 in the last 72 hours No components found with this basename: POCBNP:3 No results found for this basename: DDIMER:2 in the last 72 hours No results found for this basename: HGBA1C:2 in the last 72 hours No results found for this basename: CHOL:2,HDL:2,LDLCALC:2,TRIG:2,CHOLHDL:2,LDLDIRECT:2 in the last 72 hours No results found for this basename: TSH,T4TOTAL,FREET3,T3FREE,THYROIDAB in the last 72 hours No results found for this basename: VITAMINB12:2,FOLATE:2,FERRITIN:2,TIBC:2,IRON:2,RETICCTPCT:2 in the last 72 hours  Micro Results: No results found for this or any previous visit (from the past 240 hour(s)).  Studies/Results: Ct Abdomen Pelvis Wo Contrast  08/13/2011  *RADIOLOGY REPORT*  Clinical Data: Abdominal pain.  Unresponsive.  CT ABDOMEN AND PELVIS WITHOUT CONTRAST  Technique:  Multidetector CT imaging of the abdomen and pelvis was performed following the standard protocol without intravenous contrast.  Comparison: None.  Findings: Lack of intravenous and oral contrast severely limits the exam.  Moderate bilateral pleural effusions and bibasilar consolidation. Coronary artery calcifications.  Aortic valvular calcifications.  Gallbladder is decompressed.  Liver is within normal limits.  Small amount of free fluid anterior to the liver.  The stomach is markedly distended.  Small bowel loops are also markedly distended.  Distal small bowel is decompressed.  There is a small amount of enteric contrast in the lower abdomen at the midline likely contained within a bowel loop.  Colon is also relatively decompressed.  Small amount of free fluid is seen layering in the pelvis.  The kidneys have a markedly abnormal appearance  bilaterally.  The left kidney is enlarged and markedly deformed with areas of curvilinear calcification and mixed  density.  Similar changes in the right kidney but to a lesser degree.  Underlying renal mass cannot be excluded.  Dense vascular calcifications are noted.  No obvious free intraperitoneal gas.  Metallic staples are seen in the anterior abdominal wall to the left of midline in the lower abdomen.  Prominent degenerative changes of the hip joints there is a lytic area in  in the right pelvis, above the right acetabulum.  There is loss of cortex in the overlying bone.  There is also a lytic area in the posterior inferior right acetabulum on image 88.  These lytic areas are greater than one would expect with simple degenerative changes.  Aggressive process cannot be excluded.  Gas within venous structures near the right groin are likely iatrogenic.  IMPRESSION: Very limited exam.  Bilateral pleural effusions and bibasilar consolidation.  Small bowel obstruction pattern.  The transition point is in the lower abdomen.  There are lytic lesions about the right acetabulum as described. An aggressive process such as metastatic disease or myeloma cannot be excluded.  MRI may be helpful.  Markedly abnormal appearance of the kidneys.  Renal mass cannot be excluded.  Original Report Authenticated By: Donavan Burnet, M.D.   Dg Chest 1 View  08/13/2011  *RADIOLOGY REPORT*  Clinical Data: Pneumonia  CHEST - 1 VIEW  Comparison: 08/13/2011  Findings: Diffuse airspace disease throughout the right lung. Small right pleural effusion.  Minimal linear atelectasis at the left base.  Cardiomegaly.  Airspace disease on the left has improved.  IMPRESSION: Improved left airspace disease.  Stable airspace disease throughout the right lung with a right pleural effusion.  Original Report Authenticated By: Donavan Burnet, M.D.   Dg Abd 1 View  08/20/2011  *RADIOLOGY REPORT*  Clinical Data: Abdominal distention.  ABDOMEN - 1 VIEW  Comparison: Plain film the abdomen 08/16/2011 and CT abdomen and pelvis 08/13/2011.  Findings: There are some  scattered gas filled but nondilated loops of small bowel.  Small amount of gas is seen in the ascending colon.  NG tube is no longer visualized.  Calcified cystic lesion in the left upper quadrant as seen on CT noted.  Atherosclerotic vascular disease is also noted.  IMPRESSION: Some increase in gas filled but nondilated loops of small bowel could be due to progressive small bowel obstruction.  Original Report Authenticated By: Bernadene Bell. Maricela Curet, M.D.   Ct Abdomen Pelvis W Contrast  08/27/2011  *RADIOLOGY REPORT*  Clinical Data: Abdominal pain.  Septic.  CT ABDOMEN AND PELVIS WITH CONTRAST  Technique:  Multidetector CT imaging of the abdomen and pelvis was performed following the standard protocol during bolus administration of intravenous contrast.  Contrast: 40mL OMNIPAQUE IOHEXOL 300 MG/ML  SOLN, OMNIPAQUE IOHEXOL 300 MG/ML  SOLN  Comparison: CT scan 08/13/2011.  Findings: The lung bases demonstrate bilateral pleural effusions and bilateral infiltrates.  The liver is unremarkable and stable.  No focal lesions or intrahepatic biliary dilatation.  Gallstones and gallbladder sludge are suspected.  The pancreas is grossly normal.  The spleen is normal in size.  No focal lesions.  The adrenal glands demonstrate mild nodular enlargement suggesting nodular hyperplasia.  The kidneys demonstrate multiple bilateral rim calcified cysts. The right kidney is small and scarred.  The stomach is distended with contrast.  No obvious abnormality. There is a cluster of small bowel loops in the right mid abdomen which demonstrate  mild distention.  Findings suspicious for an early closed loop obstruction or possible compartmentalized small bowel. No pneumatosis or significant wall thickening.  There is enhancement of the mucosa and the vascular pedicle demonstrates enhancement.  The remaining small bowel loops are normal in caliber. The colon is grossly normal.  There is diffuse and fairly marked rectal wall thickening.   The aorta demonstrates advanced atherosclerotic changes.  No focal aneurysm or dissection.  The major branch vessels demonstrate severe atherosclerotic calcifications.  The bladder is grossly normal.  The prostate gland and seminal vesicles are unremarkable.  There is a small amount of free pelvic fluid.  Examination of the bony structures demonstrates stable lytic lesions.  IMPRESSION:  1.  Findings concerning for an early closed loop obstruction with compartmentalized small bowel loops.  Recommend surgical consultation. 2.  Diffuse and marked rectal wall thickening.  This could be infectious or inflammatory.  3.  Severe atherosclerotic changes involving the aorta and branch vessels. 4.  Bilateral infiltrates and effusions. 5.  Severe cystic kidney disease.  Original Report Authenticated By: P. Loralie Champagne, M.D.   Ir Pta Venous Left  08/18/2011  *RADIOLOGY REPORT*  Clinical history:End-stage renal disease and clotted left upper extremity cephalic vein fistula.  PROCEDURE(S): DIALYSIS  FISTULA DECLOT; CEPHALIC VEIN ANGIOPLASTY; ULTRASOUND GUIDANCE FOR VASCULAR ACCESS  Physician: Rachelle Hora. Henn, MD  Medications:Heparin 3000 units, TPA 2 mg  Fluoroscopy time: 22.5 minutes  Contrast: 100 ml Omnipaque 300  Procedure:Informed consent was obtained for a declot procedure. The left upper arm was prepped and draped in a sterile fashion. Maximal barrier sterile technique was utilized including caps, mask, sterile gowns, sterile gloves, sterile drape, hand hygiene and skin antiseptic.  The skin was anesthetized with 1% lidocaine. The fistula was accessed using a 21 gauge needle towards the central venous system with ultrasound guidance.  Ultrasound images were obtained for documentation. Micropuncture catheter was placed. A 6-French vascular sheath was placed.  A Kumpe catheter was advanced in the central veins using a Glidewire.  Central venogram was performed.  The catheter was pulled back as TPA was infused within the  cephalic vein thrombus.  Fluoroscopic images were saved for documentation. A Bentson wire was placed centrally and the cephalic vein was treated with the AngioJet thrombectomy device. The cephalic vein was also angioplastied with a 6 mm x 40 mm balloon.  Thrombus was treated again with the AngioJet device and the PTD thrombectomy device.  Using ultrasound guidance, a 21 gauge needle was directed into the vein towards the arterial anastomosis.  Micropuncture dilator set was placed and a 6 [place] vascular sheath was placed. Although there was already flow from the arterial system, a Fogarty balloon was pulled through the vein in order to dislodge thrombus. There was improved flow within the fistula after using the Fogarty balloon.  The central cephalic vein was angioplastied with 6 mm x 40 mm balloon.  At this point, there was flow throughout the fistula.  However, the flow was only temporary.  The central cephalic vein and mid humeral cephalic vein required angioplasty multiple times in order to keep the cephalic vein patent.   At the end of the procedure, there was a pulse within the cephalic vein but poor outflow.  The vascular sheaths were removed with pursestring sutures.  Findings:The left cephalic vein fistula anastomosis is near the elbow.  The vein was patent near the arterial anastomosis but occluded in the mid and upper arm.  The central cephalic vein was also  patent.  A large amount of the thrombus was removed using the AngioJet and mechanical thrombectomy device.  Eventually, there was some flow throughout the fistula after balloon angioplasty of a tight stenosis in the central cephalic vein and recurrent stenosis in the mid humeral cephalic vein. At the end of the procedure, there was still a large amount of thrombus within the aneurysmal segment of the mid humeral cephalic vein.  Partial recanalization of the upper left arm cephalic vein.  Impression:Declot of the left upper extremity fistula.  There is  improved flow through the fistula but the outflow is tenuous and high risk to occlude again.  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  Discussed the declot procedure with Dr. Hyman Hopes.   Recommend trying the fistula for dialysis.  If the fistula occludes again, recommend a catheter and surgical consultation for a new access.  Original Report Authenticated By: Richarda Overlie, M.D.   Ir Fluoro Guide Cv Line Left  08/20/2011  *RADIOLOGY REPORT*  Clinical data: End-stage renal disease.  Occluded arm graft.  Needs access for hemodialysis.  TUNNELED HEMODIALYSIS CATHETER PLACEMENT WITH ULTRASOUND AND FLUOROSCOPIC GUIDANCE:  Comparison: None  Technique: The procedure, risks, benefits, and alternatives were explained to the patient.  Questions regarding the procedure were encouraged and answered.  The patient understands and consents to the procedure. The patient was on adequate   antibiotic coverage, so no additional preprocedural antibiotics were administered. Ultrasound suggested occlusion of the right IJ vein at the thoracic inlet.    Patency of the left IJ vein was confirmed with ultrasound with image documentation. An appropriate skin site was determined. Region was prepped using maximum barrier technique including cap and mask, sterile gown, sterile gloves, large sterile sheet, and Chlorhexidine   as cutaneous antisepsis. The region was infiltrated locally with 1% lidocaine. Under real-time ultrasound guidance, the left IJ vein was accessed with a 21 gauge micropuncture needle; the needle tip within the vein was confirmed with ultrasound image documentation.   Needle exchanged over the 018 guidewire for transitional dilator, which allowed advancement of a Benson wire into the IVC. Over this, an MPA catheter was advanced. A Hemosplit 23 hemodialysis catheter was tunneled from the right anterior chest wall approach to the left IJ dermatotomy site. The MPA catheter was exchanged over an Amplatz  wire for serial vascular dilators which allow placement of a peel-away sheath, through which the catheter was advanced under intermittent fluoroscopy, positioned with its tips in the proximal and midright atrium. Spot chest radiograph confirms good catheter position. No pneumothorax. Catheter was flushed and primed per protocol. Catheter secured externally with O Prolene sutures. The left IJ   dermatotomy site was closed with Dermabond. No immediate complication.  IMPRESSION:  1. Technically successful placement of tunneled left IJ hemodialysis catheter with ultrasound and fluoroscopic guidance. Ready for routine use.  Access management: Remains approachable for percutaneous intervention as needed.  Original Report Authenticated By: Osa Craver, M.D.   Ir Av Dialysis Graft Declot  08/18/2011  *RADIOLOGY REPORT*  Clinical history:End-stage renal disease and clotted left upper extremity cephalic vein fistula.  PROCEDURE(S): DIALYSIS  FISTULA DECLOT; CEPHALIC VEIN ANGIOPLASTY; ULTRASOUND GUIDANCE FOR VASCULAR ACCESS  Physician: Rachelle Hora. Henn, MD  Medications:Heparin 3000 units, TPA 2 mg  Fluoroscopy time: 22.5 minutes  Contrast: 100 ml Omnipaque 300  Procedure:Informed consent was obtained for a declot procedure. The left upper arm was prepped and draped in a sterile fashion. Maximal barrier sterile technique was utilized  including caps, mask, sterile gowns, sterile gloves, sterile drape, hand hygiene and skin antiseptic.  The skin was anesthetized with 1% lidocaine. The fistula was accessed using a 21 gauge needle towards the central venous system with ultrasound guidance.  Ultrasound images were obtained for documentation. Micropuncture catheter was placed. A 6-French vascular sheath was placed.  A Kumpe catheter was advanced in the central veins using a Glidewire.  Central venogram was performed.  The catheter was pulled back as TPA was infused within the cephalic vein thrombus.  Fluoroscopic images were  saved for documentation. A Bentson wire was placed centrally and the cephalic vein was treated with the AngioJet thrombectomy device. The cephalic vein was also angioplastied with a 6 mm x 40 mm balloon.  Thrombus was treated again with the AngioJet device and the PTD thrombectomy device.  Using ultrasound guidance, a 21 gauge needle was directed into the vein towards the arterial anastomosis.  Micropuncture dilator set was placed and a 6 [place] vascular sheath was placed. Although there was already flow from the arterial system, a Fogarty balloon was pulled through the vein in order to dislodge thrombus. There was improved flow within the fistula after using the Fogarty balloon.  The central cephalic vein was angioplastied with 6 mm x 40 mm balloon.  At this point, there was flow throughout the fistula.  However, the flow was only temporary.  The central cephalic vein and mid humeral cephalic vein required angioplasty multiple times in order to keep the cephalic vein patent.   At the end of the procedure, there was a pulse within the cephalic vein but poor outflow.  The vascular sheaths were removed with pursestring sutures.  Findings:The left cephalic vein fistula anastomosis is near the elbow.  The vein was patent near the arterial anastomosis but occluded in the mid and upper arm.  The central cephalic vein was also patent.  A large amount of the thrombus was removed using the AngioJet and mechanical thrombectomy device.  Eventually, there was some flow throughout the fistula after balloon angioplasty of a tight stenosis in the central cephalic vein and recurrent stenosis in the mid humeral cephalic vein. At the end of the procedure, there was still a large amount of thrombus within the aneurysmal segment of the mid humeral cephalic vein.  Partial recanalization of the upper left arm cephalic vein.  Impression:Declot of the left upper extremity fistula.  There is improved flow through the fistula but the outflow  is tenuous and high risk to occlude again.  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  Discussed the declot procedure with Dr. Hyman Hopes.   Recommend trying the fistula for dialysis.  If the fistula occludes again, recommend a catheter and surgical consultation for a new access.  Original Report Authenticated By: Richarda Overlie, M.D.   Ir Angio Av Shunt Addl Access  08/18/2011  *RADIOLOGY REPORT*  Clinical history:End-stage renal disease and clotted left upper extremity cephalic vein fistula.  PROCEDURE(S): DIALYSIS  FISTULA DECLOT; CEPHALIC VEIN ANGIOPLASTY; ULTRASOUND GUIDANCE FOR VASCULAR ACCESS  Physician: Rachelle Hora. Henn, MD  Medications:Heparin 3000 units, TPA 2 mg  Fluoroscopy time: 22.5 minutes  Contrast: 100 ml Omnipaque 300  Procedure:Informed consent was obtained for a declot procedure. The left upper arm was prepped and draped in a sterile fashion. Maximal barrier sterile technique was utilized including caps, mask, sterile gowns, sterile gloves, sterile drape, hand hygiene and skin antiseptic.  The skin was anesthetized with 1% lidocaine. The fistula was accessed  using a 21 gauge needle towards the central venous system with ultrasound guidance.  Ultrasound images were obtained for documentation. Micropuncture catheter was placed. A 6-French vascular sheath was placed.  A Kumpe catheter was advanced in the central veins using a Glidewire.  Central venogram was performed.  The catheter was pulled back as TPA was infused within the cephalic vein thrombus.  Fluoroscopic images were saved for documentation. A Bentson wire was placed centrally and the cephalic vein was treated with the AngioJet thrombectomy device. The cephalic vein was also angioplastied with a 6 mm x 40 mm balloon.  Thrombus was treated again with the AngioJet device and the PTD thrombectomy device.  Using ultrasound guidance, a 21 gauge needle was directed into the vein towards the arterial anastomosis.  Micropuncture  dilator set was placed and a 6 [place] vascular sheath was placed. Although there was already flow from the arterial system, a Fogarty balloon was pulled through the vein in order to dislodge thrombus. There was improved flow within the fistula after using the Fogarty balloon.  The central cephalic vein was angioplastied with 6 mm x 40 mm balloon.  At this point, there was flow throughout the fistula.  However, the flow was only temporary.  The central cephalic vein and mid humeral cephalic vein required angioplasty multiple times in order to keep the cephalic vein patent.   At the end of the procedure, there was a pulse within the cephalic vein but poor outflow.  The vascular sheaths were removed with pursestring sutures.  Findings:The left cephalic vein fistula anastomosis is near the elbow.  The vein was patent near the arterial anastomosis but occluded in the mid and upper arm.  The central cephalic vein was also patent.  A large amount of the thrombus was removed using the AngioJet and mechanical thrombectomy device.  Eventually, there was some flow throughout the fistula after balloon angioplasty of a tight stenosis in the central cephalic vein and recurrent stenosis in the mid humeral cephalic vein. At the end of the procedure, there was still a large amount of thrombus within the aneurysmal segment of the mid humeral cephalic vein.  Partial recanalization of the upper left arm cephalic vein.  Impression:Declot of the left upper extremity fistula.  There is improved flow through the fistula but the outflow is tenuous and high risk to occlude again.  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  Discussed the declot procedure with Dr. Hyman Hopes.   Recommend trying the fistula for dialysis.  If the fistula occludes again, recommend a catheter and surgical consultation for a new access.  Original Report Authenticated By: Richarda Overlie, M.D.   Ir US Guide Vasc Access Left  08/20/2011  *RADIOLOGY  REPORT*  Clinical data: End-stage renal disease.  Occluded arm graft.  Needs access for hemodialysis.  TUNNELED HEMODIALYSIS CATHETER PLACEMENT WITH ULTRASOUND AND FLUOROSCOPIC GUIDANCE:  Comparison: None  Technique: The procedure, risks, benefits, and alternatives were explained to the patient.  Questions regarding the procedure were encouraged and answered.  The patient understands and consents to the procedure. The patient was on adequate   antibiotic coverage, so no additional preprocedural antibiotics were administered. Ultrasound suggested occlusion of the right IJ vein at the thoracic inlet.    Patency of the left IJ vein was confirmed with ultrasound with image documentation. An appropriate skin site was determined. Region was prepped using maximum barrier technique including cap and mask, sterile gown, sterile gloves, large sterile sheet, and Chlorhexidine  as cutaneous antisepsis. The region was infiltrated locally with 1% lidocaine. Under real-time ultrasound guidance, the left IJ vein was accessed with a 21 gauge micropuncture needle; the needle tip within the vein was confirmed with ultrasound image documentation.   Needle exchanged over the 018 guidewire for transitional dilator, which allowed advancement of a Benson wire into the IVC. Over this, an MPA catheter was advanced. A Hemosplit 23 hemodialysis catheter was tunneled from the right anterior chest wall approach to the left IJ dermatotomy site. The MPA catheter was exchanged over an Amplatz wire for serial vascular dilators which allow placement of a peel-away sheath, through which the catheter was advanced under intermittent fluoroscopy, positioned with its tips in the proximal and midright atrium. Spot chest radiograph confirms good catheter position. No pneumothorax. Catheter was flushed and primed per protocol. Catheter secured externally with O Prolene sutures. The left IJ   dermatotomy site was closed with Dermabond. No immediate  complication.  IMPRESSION:  1. Technically successful placement of tunneled left IJ hemodialysis catheter with ultrasound and fluoroscopic guidance. Ready for routine use.  Access management: Remains approachable for percutaneous intervention as needed.  Original Report Authenticated By: Osa Craver, M.D.   Ir US Guide Vasc Access Left  08/18/2011  *RADIOLOGY REPORT*  Clinical history:End-stage renal disease and clotted left upper extremity cephalic vein fistula.  PROCEDURE(S): DIALYSIS  FISTULA DECLOT; CEPHALIC VEIN ANGIOPLASTY; ULTRASOUND GUIDANCE FOR VASCULAR ACCESS  Physician: Rachelle Hora. Henn, MD  Medications:Heparin 3000 units, TPA 2 mg  Fluoroscopy time: 22.5 minutes  Contrast: 100 ml Omnipaque 300  Procedure:Informed consent was obtained for a declot procedure. The left upper arm was prepped and draped in a sterile fashion. Maximal barrier sterile technique was utilized including caps, mask, sterile gowns, sterile gloves, sterile drape, hand hygiene and skin antiseptic.  The skin was anesthetized with 1% lidocaine. The fistula was accessed using a 21 gauge needle towards the central venous system with ultrasound guidance.  Ultrasound images were obtained for documentation. Micropuncture catheter was placed. A 6-French vascular sheath was placed.  A Kumpe catheter was advanced in the central veins using a Glidewire.  Central venogram was performed.  The catheter was pulled back as TPA was infused within the cephalic vein thrombus.  Fluoroscopic images were saved for documentation. A Bentson wire was placed centrally and the cephalic vein was treated with the AngioJet thrombectomy device. The cephalic vein was also angioplastied with a 6 mm x 40 mm balloon.  Thrombus was treated again with the AngioJet device and the PTD thrombectomy device.  Using ultrasound guidance, a 21 gauge needle was directed into the vein towards the arterial anastomosis.  Micropuncture dilator set was placed and a 6 [place]  vascular sheath was placed. Although there was already flow from the arterial system, a Fogarty balloon was pulled through the vein in order to dislodge thrombus. There was improved flow within the fistula after using the Fogarty balloon.  The central cephalic vein was angioplastied with 6 mm x 40 mm balloon.  At this point, there was flow throughout the fistula.  However, the flow was only temporary.  The central cephalic vein and mid humeral cephalic vein required angioplasty multiple times in order to keep the cephalic vein patent.   At the end of the procedure, there was a pulse within the cephalic vein but poor outflow.  The vascular sheaths were removed with pursestring sutures.  Findings:The left cephalic vein fistula anastomosis is near the elbow.  The vein was  patent near the arterial anastomosis but occluded in the mid and upper arm.  The central cephalic vein was also patent.  A large amount of the thrombus was removed using the AngioJet and mechanical thrombectomy device.  Eventually, there was some flow throughout the fistula after balloon angioplasty of a tight stenosis in the central cephalic vein and recurrent stenosis in the mid humeral cephalic vein. At the end of the procedure, there was still a large amount of thrombus within the aneurysmal segment of the mid humeral cephalic vein.  Partial recanalization of the upper left arm cephalic vein.  Impression:Declot of the left upper extremity fistula.  There is improved flow through the fistula but the outflow is tenuous and high risk to occlude again.  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  Discussed the declot procedure with Dr. Hyman Hopes.   Recommend trying the fistula for dialysis.  If the fistula occludes again, recommend a catheter and surgical consultation for a new access.  Original Report Authenticated By: Richarda Overlie, M.D.   Dg Chest Port 1 View  08/30/2011  *RADIOLOGY REPORT*  Clinical Data: Check endotracheal  tube placement.  Pneumonia.  PORTABLE CHEST - 1 VIEW  Comparison: Portable chest 08/28/2011.  Findings: The patient has been extubated.  The NG tube was removed as well.  A left IJ dialysis catheter is stable.  There is slight improved aeration of the left lung.  Right-sided airspace disease is not significantly changed.  Bilateral pleural effusions are evident, right greater than left.  Mild cardiomegaly is stable. Pancreatic calcifications are again noted.  IMPRESSION:  1.  Slight improved aeration of the left lung. 2.  Right-sided airspace disease is stable. 3.  Slight increase in bilateral pleural effusions, right greater than left. 4.  The patient has been extubated.  The NG tube was removed as well. 5.  The appearance remains concerning for bilateral pneumonia.  Original Report Authenticated By: Jamesetta Orleans. MATTERN, M.D.   Dg Chest Port 1 View  08/28/2011  *RADIOLOGY REPORT*  Clinical Data: Intubated patient.  PORTABLE CHEST - 1 VIEW  Comparison: Portable chest 08/27/2011.  Findings: Support tubes and lines are unchanged.  Extensive bilateral airspace disease persists.  Aeration in the right chest has worsened.  Right greater than left pleural effusions noted.  IMPRESSION: Extensive bilateral airspace disease shows worsening on the right.  Original Report Authenticated By: Bernadene Bell. D'ALESSIO, M.D.   Dg Chest Portable 1 View  08/27/2011  *RADIOLOGY REPORT*  Clinical Data: Line placement.  PORTABLE CHEST - 1 VIEW  Comparison: 08/26/2011.  Findings: The support apparatus is in good position.  The endotracheal tube is now 4.6 cm above the carina.  Interval progressive airspace consolidation in the left lung could reflect worsening edema or infiltrate.  Small effusions are stable.  IMPRESSION:  1.  Support apparatus in good position without complicating features. 2.  Worsening airspace consolidation, left greater than right could reflect worsening edema or infiltrate. 3.  Stable small effusions.  Original  Report Authenticated By: P. Loralie Champagne, M.D.   Dg Chest Port 1 View  08/26/2011  *RADIOLOGY REPORT*  Clinical Data: Endotracheal tube placement.  PORTABLE CHEST - 1 VIEW  Comparison: 08/16/2011.  Findings: The endotracheal tube is right at the right mainstem bronchus and should be retracted 3 cm.  The dialysis catheter is stable.  The NG tube is in the stomach.  There is pulmonary edema, areas of atelectasis and a right pleural effusion.  IMPRESSION: The endotracheal  tube is at the right mainstem bronchus and should be retracted 3 cm.  Original Report Authenticated By: P. Loralie Champagne, M.D.   Dg Chest Port 1 View  08/16/2011  *RADIOLOGY REPORT*  Clinical Data: Pneumonia.  Pleural effusion.  PORTABLE CHEST - 1 VIEW  Comparison: 08/15/2011  Findings: Nasogastric tube extends into the stomach.  Cardiomegaly noted with mild interstitial accentuation and increased obscuration of the right hemidiaphragm primarily ascribed to pleural effusion and associated passive atelectasis.  There is mild atelectasis at the left lung base along with a small left pleural effusion.  Lung volumes are slightly worsened compared to yesterday's exam.  Atherosclerotic calcification of the aortic arch noted. Glenohumeral degenerative findings noted.  IMPRESSION:  1.  Increase in apparent size of the right pleural effusion with associated passive atelectasis. 2.  Small left pleural effusion with left basilar atelectasis also noted. 3.  Cardiomegaly with minimal interstitial accentuation.  Original Report Authenticated By: Dellia Cloud, M.D.   Dg Chest Port 1 View  08/15/2011  *RADIOLOGY REPORT*  Clinical Data: Status post right thoracentesis.  PORTABLE CHEST - 1 VIEW  Comparison: Earlier the same day  Findings: 1556 hours.  Right pleural effusion has decreased in the interval.  No evidence for pneumothorax.  There is some subsegmental atelectasis at the right base. The NG tube passes into the stomach although the distal  tip position is not included on the film. The cardiopericardial silhouette is enlarged.  Diffuse interstitial opacity is improved in the interval, suggesting resolving edema.  IMPRESSION: No evidence for pneumothorax after right thoracentesis.  Original Report Authenticated By: ERIC A. MANSELL, M.D.   Dg Chest Port 1 View  08/15/2011  *RADIOLOGY REPORT*  Clinical Data: Shortness of breath and pleural effusion.  PORTABLE CHEST - 1 VIEW  Comparison: 08/14/2011  Findings: The NG tube passes into the stomach although the distal tip position is not included on the film. The cardiopericardial silhouette is enlarged.  Vascular congestion with diffuse interstitial opacity is consistent with edema.  There is bibasilar atelectasis and right pleural effusion.  IMPRESSION: No substantial change.  Cardiomegaly with edema and right pleural effusion.  Original Report Authenticated By: ERIC A. MANSELL, M.D.   Portable Chest Xray In Am  08/14/2011  *RADIOLOGY REPORT*  Clinical Data: Respiratory failure  PORTABLE CHEST - 1 VIEW  Comparison: Aug 13, 2011  Findings: There is cardiomegaly and pulmonary edema, as well as a moderate sized right pleural effusion.  Overall, the findings  have not significantly changed.  The nasogastric tube tip courses off the inferior film.  IMPRESSION: No significant change in cardiomegaly, pulmonary edema, moderate sized right pleural effusion.  Original Report Authenticated By: Brandon Melnick, M.D.   Dg Chest Port 1 View  08/13/2011  *RADIOLOGY REPORT*  Clinical Data: Unresponsive.  Shortness of breath and abdominal distention.  PORTABLE CHEST - 1 VIEW  Comparison: 07/31/2011.  Findings: Trachea is midline.  Heart size stable.  There is bilateral air space disease, right greater than left, with progression from 07/31/2011.  Associated right pleural effusion. There may be a tiny left pleural effusion.  IMPRESSION: Worsening bilateral air space disease with an associated right pleural effusion.   Question tiny left pleural effusion.  Congestive heart failure is likely.  Original Report Authenticated By: Reyes Ivan, M.D.   Dg Abd Portable 1v  08/16/2011  *RADIOLOGY REPORT*  Clinical Data: Small bowel obstruction  PORTABLE ABDOMEN - 1 VIEW  Comparison: 08/15/2011  Findings: Vascular calcifications and rim calcified  left renal lesions noted.  Nasogastric tube extends into the stomach.  The right femoral line projects over the pelvis.  Left pelvic skin clips noted.  Currently most of the bowel is gasless.  The visualized loops of bowel do not demonstrate bowel dilatation.  Lumbar spondylosis is again noted. Pleural effusions noted at the lung bases, with cardiomegaly.  IMPRESSION:  1.  No dilated bowel is currently visible, although much of the bowel is gasless. 2.  Small right and smaller left pleural effusions. 3.  Calcified left renal cysts or masses. 4.  Vascular calcifications. 5.  Right femoral line tip projects over the right sacral ala.  Original Report Authenticated By: Dellia Cloud, M.D.   Dg Abd Portable 1v  08/15/2011  *RADIOLOGY REPORT*  Clinical Data: Small bowel obstruction.  PORTABLE ABDOMEN - 1 VIEW  Comparison: Plain films and CT abdomen and pelvis 08/13/2011.  Findings: New NG tube and right femoral catheter are in place. Surgical staples to the left of midline again noted.  Gas filled small bowel loops are again seen but appear less numerous and prominent.  Bilateral upper quadrant calcifications are noted and consistent with renal calcifications seen on CT.  IMPRESSION: Mild improvement small bowel obstruction.  Original Report Authenticated By: Bernadene Bell. Maricela Curet, M.D.   Dg Abd Portable 1v  08/13/2011  *RADIOLOGY REPORT*  Clinical Data: Unresponsive.  Shortness of breath and abdominal distention.  PORTABLE ABDOMEN - 1 VIEW  Comparison: None.  Findings: There is mild gaseous distention of small bowel loops. Some colonic gas and stool are seen.  Surgical skin staples  overlie the left lower quadrant.  Peripherally calcified structure in the left paraspinal region has the shape of the left kidney.  Additional tubular calcifications are seen in the left upper quadrant.  There may be small calcifications in the right paraspinal region.  IMPRESSION:  1.  Mild small bowel distention with possible wall thickening.  CT abdomen pelvis with contrast would likely be helpful in further evaluation, as clinically indicated. 2.  Calcified structures in the left upper quadrant, and likely right paraspinal region, as discussed above.  These could also be further assessed with CT.  Original Report Authenticated By: Reyes Ivan, M.D.   Dg Abd Portable 2v  08/28/2011  *RADIOLOGY REPORT*  Clinical Data: Abdominal distention.  Question ileus or small bowel obstruction.  PORTABLE ABDOMEN - 2 VIEW  Comparison: CT abdomen pelvis 08/27/2011 and plain film of the abdomen 08/20/2011.  Findings: The abdomen is nearly gasless.  NG tube and right groin catheter are in place.  Calcified cystic lesion in the left upper quadrant as seen on CT scan again noted.  Multiple calcifications in the spleen are also again seen.  IMPRESSION: The abdomen is nearly gasless making assessment of small bowel difficult.  No definite acute finding.  Original Report Authenticated By: Bernadene Bell. Maricela Curet, M.D.    Medications: I have reviewed the patient's current medications. Scheduled Meds:    . amLODipine  10 mg Oral Daily  . antiseptic oral rinse  15 mL Mouth Rinse q12n4p  . chlorhexidine  15 mL Mouth Rinse BID  . cinacalcet  60 mg Oral Q breakfast  . darbepoetin (ARANESP) injection - DIALYSIS  100 mcg Intravenous Q Wed-HD  . doxercalciferol  6 mcg Intravenous Q M,W,F-HD  . famotidine  20 mg Oral QHS  . feeding supplement (NEPRO CARB STEADY)  237 mL Oral Q24H  . metoprolol tartrate  100 mg Oral BID  . multivitamin  1 tablet Oral QHS  . sodium chloride  10-40 mL Intracatheter Q12H  . vancomycin  125 mg  Oral Q6H  . vancomycin  1,000 mg Intravenous To OR   Continuous Infusions:  PRN Meds:.sodium chloride, heparin, morphine injection, oxyCODONE, senna-docusate, sodium chloride Assessment/Plan: Patient Active Hospital Problem List: Septic shock (08/27/2011)   Assessment: Septic shock was associated with fulminant C. difficile infection and hospital-acquired pneumonia. The patient is fully recovered from his septic shock is no hemodynamically stable.diarrhea.    Chronic diastolic CHF (congestive heart failure) (07/23/2010)   Assessment: Clinically well compensated     ESRD (end stage renal disease) ()   Assessment: Hemodialysis dependent and tolerating well by mouth     C. difficile colitis (08/14/2011)   Assessment:  The patient had 5 days of IV Flagyl and  9/14 days of by mouth vancomycin . Based on clinical progression, Flagyl has ben discontinued and patient continues to improve.  Will complete a total of 14 days of Vancomycin.  CAD (coronary artery disease) (08/14/2011)   Assessment: No chest pain. Information noted regarding cardiology evaluation.    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  Constipation (09/06/2028)   Assessment: Patient has had constipation a few days. We'll give a dose of MiraLax tonight the    LOS: 13 days

## 2011-09-08 NOTE — Progress Notes (Signed)
09/08/2011 Agree with OT note;   Thanks,  [place], [place] [phone]

## 2011-09-09 ENCOUNTER — Encounter (HOSPITAL_COMMUNITY): Payer: Self-pay | Admitting: Anesthesiology

## 2011-09-09 ENCOUNTER — Encounter (HOSPITAL_COMMUNITY)
Admission: EM | Disposition: A | Discharge status: Other Acute Inpatient Hospital | Payer: Self-pay | Source: Home / Self Care | Attending: Internal Medicine

## 2011-09-09 ENCOUNTER — Inpatient Hospital Stay (HOSPITAL_COMMUNITY): Payer: Medicare Other | Admitting: Anesthesiology

## 2011-09-09 DIAGNOSIS — I743 Embolism and thrombosis of arteries of the lower extremities: Secondary | ICD-10-CM

## 2011-09-09 DIAGNOSIS — I70269 Atherosclerosis of native arteries of extremities with gangrene, unspecified extremity: Secondary | ICD-10-CM

## 2011-09-09 DIAGNOSIS — A0472 Enterocolitis due to Clostridium difficile, not specified as recurrent: Secondary | ICD-10-CM

## 2011-09-09 DIAGNOSIS — K5900 Constipation, unspecified: Secondary | ICD-10-CM

## 2011-09-09 DIAGNOSIS — N186 End stage renal disease: Secondary | ICD-10-CM

## 2011-09-09 HISTORY — PX: FEMORAL-POPLITEAL BYPASS GRAFT: SHX937

## 2011-09-09 LAB — CBC
MCV: 89.7 fL (ref 78.0–100.0)
Platelets: 250 10*3/uL (ref 150–400)
RDW: 19 % — ABNORMAL HIGH (ref 11.5–15.5)
WBC: 9.4 10*3/uL (ref 4.0–10.5)

## 2011-09-09 LAB — PREPARE RBC (CROSSMATCH)

## 2011-09-09 LAB — GLUCOSE, CAPILLARY: Glucose-Capillary: 89 mg/dL (ref 70–99)

## 2011-09-09 SURGERY — BYPASS GRAFT FEMORAL-POPLITEAL ARTERY
Anesthesia: General | Site: Leg Lower | Laterality: Left

## 2011-09-09 MED ORDER — ALBUMIN HUMAN 5 % IV SOLN
12.5000 g | Freq: Once | INTRAVENOUS | Status: AC
  Administered 2011-09-09: 12.5 g via INTRAVENOUS

## 2011-09-09 MED ORDER — FENTANYL CITRATE 0.05 MG/ML IJ SOLN
INTRAMUSCULAR | Status: DC | PRN
  Administered 2011-09-09 (×3): 50 ug via INTRAVENOUS
  Administered 2011-09-09: 100 ug via INTRAVENOUS
  Administered 2011-09-09 (×2): 50 ug via INTRAVENOUS

## 2011-09-09 MED ORDER — ROCURONIUM BROMIDE 100 MG/10ML IV SOLN
INTRAVENOUS | Status: DC | PRN
  Administered 2011-09-09: 10 mg via INTRAVENOUS
  Administered 2011-09-09: 40 mg via INTRAVENOUS

## 2011-09-09 MED ORDER — 0.9 % SODIUM CHLORIDE (POUR BTL) OPTIME
TOPICAL | Status: DC | PRN
  Administered 2011-09-09 (×2): 1000 mL

## 2011-09-09 MED ORDER — SODIUM CHLORIDE 0.9 % IR SOLN
Status: DC | PRN
  Administered 2011-09-09: 15:00:00

## 2011-09-09 MED ORDER — PROPOFOL 10 MG/ML IV EMUL
INTRAVENOUS | Status: DC | PRN
  Administered 2011-09-09: 90 mg via INTRAVENOUS

## 2011-09-09 MED ORDER — ALBUMIN HUMAN 5 % IV SOLN
INTRAVENOUS | Status: AC
  Filled 2011-09-09: qty 250

## 2011-09-09 MED ORDER — SODIUM CHLORIDE 0.9 % IV SOLN
INTRAVENOUS | Status: DC | PRN
  Administered 2011-09-09: 15:00:00 via INTRAVENOUS

## 2011-09-09 MED ORDER — HEMOSTATIC AGENTS (NO CHARGE) OPTIME
TOPICAL | Status: DC | PRN
  Administered 2011-09-09: 1 via TOPICAL

## 2011-09-09 MED ORDER — HEPARIN SODIUM (PORCINE) 1000 UNIT/ML IJ SOLN
INTRAMUSCULAR | Status: DC | PRN
  Administered 2011-09-09: 2000 [IU] via INTRAVENOUS
  Administered 2011-09-09: 1000 [IU] via INTRAVENOUS
  Administered 2011-09-09: 7000 [IU] via INTRAVENOUS

## 2011-09-09 MED ORDER — LIDOCAINE HCL (CARDIAC) 20 MG/ML IV SOLN
INTRAVENOUS | Status: DC | PRN
  Administered 2011-09-09: 80 mg via INTRAVENOUS

## 2011-09-09 MED ORDER — NEOSTIGMINE METHYLSULFATE 1 MG/ML IJ SOLN
INTRAMUSCULAR | Status: DC | PRN
  Administered 2011-09-09: 3 mg via INTRAVENOUS

## 2011-09-09 MED ORDER — MIDAZOLAM HCL 5 MG/5ML IJ SOLN
INTRAMUSCULAR | Status: DC | PRN
  Administered 2011-09-09: 2 mg via INTRAVENOUS

## 2011-09-09 MED ORDER — PROTAMINE SULFATE 10 MG/ML IV SOLN
INTRAVENOUS | Status: DC | PRN
  Administered 2011-09-09: 50 mg via INTRAVENOUS

## 2011-09-09 MED ORDER — GLYCOPYRROLATE 0.2 MG/ML IJ SOLN
INTRAMUSCULAR | Status: DC | PRN
  Administered 2011-09-09: 0.4 mg via INTRAVENOUS

## 2011-09-09 MED ORDER — EPHEDRINE SULFATE 50 MG/ML IJ SOLN
INTRAMUSCULAR | Status: DC | PRN
  Administered 2011-09-09 (×2): 10 mg via INTRAVENOUS
  Administered 2011-09-09: 15 mg via INTRAVENOUS
  Administered 2011-09-09: 5 mg via INTRAVENOUS

## 2011-09-09 SURGICAL SUPPLY — 73 items
BAG SNAP BAND KOVER 36X36 (MISCELLANEOUS) ×2 IMPLANT
BANDAGE ELASTIC 4 VELCRO ST LF (GAUZE/BANDAGES/DRESSINGS) IMPLANT
BANDAGE ESMARK 6X9 LF (GAUZE/BANDAGES/DRESSINGS) ×1 IMPLANT
BNDG ESMARK 6X9 LF (GAUZE/BANDAGES/DRESSINGS) ×2
CANISTER SUCTION 2500CC (MISCELLANEOUS) ×2 IMPLANT
CANISTER WOUND CARE 500ML ATS (WOUND CARE) ×2 IMPLANT
CLIP TI MEDIUM 24 (CLIP) ×2 IMPLANT
CLIP TI WIDE RED SMALL 24 (CLIP) ×4 IMPLANT
CLOTH BEACON ORANGE TIMEOUT ST (SAFETY) ×2 IMPLANT
COVER SURGICAL LIGHT HANDLE (MISCELLANEOUS) ×6 IMPLANT
CUFF TOURNIQUET SINGLE 24IN (TOURNIQUET CUFF) IMPLANT
CUFF TOURNIQUET SINGLE 34IN LL (TOURNIQUET CUFF) ×2 IMPLANT
CUFF TOURNIQUET SINGLE 44IN (TOURNIQUET CUFF) IMPLANT
DERMABOND ADVANCED (GAUZE/BANDAGES/DRESSINGS) ×1
DERMABOND ADVANCED .7 DNX12 (GAUZE/BANDAGES/DRESSINGS) ×1 IMPLANT
DRAIN CHANNEL 15F RND FF W/TCR (WOUND CARE) ×2 IMPLANT
DRAPE INCISE IOBAN 66X45 STRL (DRAPES) ×2 IMPLANT
DRAPE WARM FLUID 44X44 (DRAPE) ×2 IMPLANT
DRAPE X-RAY CASS 24X20 (DRAPES) IMPLANT
DRSG COVADERM 4X10 (GAUZE/BANDAGES/DRESSINGS) IMPLANT
DRSG COVADERM 4X8 (GAUZE/BANDAGES/DRESSINGS) IMPLANT
DRSG EMULSION OIL 3X3 NADH (GAUZE/BANDAGES/DRESSINGS) ×2 IMPLANT
DRSG VAC ATS SM SENSATRAC (GAUZE/BANDAGES/DRESSINGS) ×2 IMPLANT
ELECT CAUTERY BLADE 6.4 (BLADE) ×2 IMPLANT
ELECT REM PT RETURN 9FT ADLT (ELECTROSURGICAL) ×4
ELECTRODE REM PT RTRN 9FT ADLT (ELECTROSURGICAL) ×2 IMPLANT
EVACUATOR SILICONE 100CC (DRAIN) ×2 IMPLANT
GAUZE SPONGE 4X4 16PLY XRAY LF (GAUZE/BANDAGES/DRESSINGS) ×2 IMPLANT
GLOVE BIO SURGEON STRL SZ 6.5 (GLOVE) ×2 IMPLANT
GLOVE BIOGEL PI IND STRL 7.0 (GLOVE) ×2 IMPLANT
GLOVE BIOGEL PI IND STRL 7.5 (GLOVE) ×2 IMPLANT
GLOVE BIOGEL PI INDICATOR 7.0 (GLOVE) ×2
GLOVE BIOGEL PI INDICATOR 7.5 (GLOVE) ×2
GLOVE ECLIPSE 6.5 STRL STRAW (GLOVE) ×4 IMPLANT
GLOVE SURG SS PI 7.5 STRL IVOR (GLOVE) ×4 IMPLANT
GOWN PREVENTION PLUS XXLARGE (GOWN DISPOSABLE) ×2 IMPLANT
GOWN STRL NON-REIN LRG LVL3 (GOWN DISPOSABLE) ×4 IMPLANT
HEMOSTAT SNOW SURGICEL 2X4 (HEMOSTASIS) ×4 IMPLANT
HEMOSTAT SURGICEL 2X14 (HEMOSTASIS) IMPLANT
KIT BASIN OR (CUSTOM PROCEDURE TRAY) ×2 IMPLANT
KIT ROOM TURNOVER OR (KITS) ×2 IMPLANT
MARKER GRAFT CORONARY BYPASS (MISCELLANEOUS) IMPLANT
NS IRRIG 1000ML POUR BTL (IV SOLUTION) ×4 IMPLANT
PACK PERIPHERAL VASCULAR (CUSTOM PROCEDURE TRAY) ×2 IMPLANT
PAD ARMBOARD 7.5X6 YLW CONV (MISCELLANEOUS) ×4 IMPLANT
PADDING CAST COTTON 6X4 STRL (CAST SUPPLIES) ×2 IMPLANT
PENCIL BUTTON HOLSTER BLD 10FT (ELECTRODE) ×2 IMPLANT
SET COLLECT BLD 21X3/4 12 (NEEDLE) ×2 IMPLANT
SNAP KAP ×2 IMPLANT
SPONGE GAUZE 4X4 12PLY (GAUZE/BANDAGES/DRESSINGS) ×2 IMPLANT
SPONGE LAP 18X18 X RAY DECT (DISPOSABLE) ×2 IMPLANT
STAPLER VISISTAT 35W (STAPLE) IMPLANT
STOPCOCK 4 WAY LG BORE MALE ST (IV SETS) ×2 IMPLANT
SUT ETHILON 3 0 PS 1 (SUTURE) ×2 IMPLANT
SUT PROLENE 5 0 C 1 24 (SUTURE) ×10 IMPLANT
SUT PROLENE 6 0 BV (SUTURE) ×6 IMPLANT
SUT PROLENE 7 0 BV 1 (SUTURE) ×4 IMPLANT
SUT PROLENE 7 0 BV1 MDA (SUTURE) ×4 IMPLANT
SUT SILK 2 0 SH (SUTURE) ×2 IMPLANT
SUT SILK 3 0 (SUTURE) ×2
SUT SILK 3-0 18XBRD TIE 12 (SUTURE) ×2 IMPLANT
SUT VIC AB 2-0 CT1 27 (SUTURE) ×4
SUT VIC AB 2-0 CT1 TAPERPNT 27 (SUTURE) ×4 IMPLANT
SUT VIC AB 3-0 SH 27 (SUTURE) ×4
SUT VIC AB 3-0 SH 27X BRD (SUTURE) ×4 IMPLANT
SUT VICRYL 4-0 PS2 18IN ABS (SUTURE) ×2 IMPLANT
TAPE CLOTH SURG 4X10 WHT LF (GAUZE/BANDAGES/DRESSINGS) ×2 IMPLANT
TOWEL OR 17X24 6PK STRL BLUE (TOWEL DISPOSABLE) ×4 IMPLANT
TOWEL OR 17X26 10 PK STRL BLUE (TOWEL DISPOSABLE) ×6 IMPLANT
TRAY FOLEY CATH 14FRSI W/METER (CATHETERS) ×2 IMPLANT
TUBING EXTENTION W/L.L. (IV SETS) ×2 IMPLANT
UNDERPAD 30X30 INCONTINENT (UNDERPADS AND DIAPERS) ×2 IMPLANT
WATER STERILE IRR 1000ML POUR (IV SOLUTION) ×2 IMPLANT

## 2011-09-09 NOTE — Progress Notes (Signed)
Dr. Steele Sizer made aware upon pt arrival of low SBP in 80's, rec'd order for 2 IV doses of Albumin if needed, second iv dose albumin infusing at this time, last BP 102/39, will cont to assess, pt also has not had one consistent reading of pulse ox since he has been in pacu, attempts have been made to obtain reading on different monitors, all fingers and toes, on his forehead, nose, and bil. Ears, pt appears to be in no resp distress, RR are wnl, will cont to assess.

## 2011-09-09 NOTE — Anesthesia Preprocedure Evaluation (Signed)
Anesthesia Evaluation  Patient identified by MRN, date of birth, ID band Patient awake    Reviewed: Allergy & Precautions, H&P , NPO status , Patient's Chart, lab work & pertinent test results  History of Anesthesia Complications Negative for: history of anesthetic complications  Airway Mallampati: II  Neck ROM: Full    Dental  (+) Edentulous Upper and Edentulous Lower   Pulmonary shortness of breath, pneumonia ,  + rhonchi         Cardiovascular hypertension, + CAD, + Peripheral Vascular Disease and +CHF + dysrhythmias Rhythm:Regular Rate:Normal + Systolic murmurs    Neuro/Psych    GI/Hepatic   Endo/Other  negative endocrine ROS  Renal/GU CRF, ESRF and DialysisRenal disease     Musculoskeletal   Abdominal   Peds  Hematology negative hematology ROS (+)   Anesthesia Other Findings   Reproductive/Obstetrics                           Anesthesia Physical Anesthesia Plan  ASA: III  Anesthesia Plan: General   Post-op Pain Management:    Induction: Intravenous  Airway Management Planned: Oral ETT  Additional Equipment:   Intra-op Plan:   Post-operative Plan: Extubation in OR  Informed Consent: I have reviewed the patients History and Physical, chart, labs and discussed the procedure including the risks, benefits and alternatives for the proposed anesthesia with the patient or authorized representative who has indicated his/her understanding and acceptance.   Dental advisory given  Plan Discussed with: CRNA and Surgeon  Anesthesia Plan Comments:         Anesthesia Quick Evaluation

## 2011-09-09 NOTE — Brief Op Note (Signed)
08/26/2011 - 09/09/2011  10:47 PM  PATIENT:  [name]  61 y.o. male  PRE-OPERATIVE DIAGNOSIS:  pvd  POST-OPERATIVE DIAGNOSIS:  Ischemic left leg  PROCEDURE:  Procedure(s) (LRB): BYPASS GRAFT FEMORAL-POPLITEAL ARTERY (Left)  SURGEON:  Surgeon(s) and Role:    [name], MD - Primary  PHYSICIAN ASSISTANT:   ASSISTANTS:[name], P.A. ANESTHESIA:   general  EBL:  Total I/O In: 200 [I.V.:200] Out: 175 [Blood:175]  BLOOD ADMINISTERED:none  DRAINS: 15 french Blake  LOCAL MEDICATIONS USED:  NONE  SPECIMEN:  Source of Specimen:  ileofemoral plaque, inguinal lymph node  DISPOSITION OF SPECIMEN:  PATHOLOGY  COUNTS:  YES  TOURNIQUET:   Total Tourniquet Time Documented: Thigh (Left) - 28 minutes  DICTATION: .Reubin Milan Dictation  PLAN OF CARE: Admit to inpatient   PATIENT DISPOSITION:  PACU - hemodynamically stable.   Delay start of Pharmacological VTE agent (>24hrs) due to surgical blood loss or risk of bleeding: yes

## 2011-09-09 NOTE — Interval H&P Note (Signed)
History and Physical Interval Note:  09/09/2011 2:57 PM  [name]  has presented today for surgery, with the diagnosis of pvd  The various methods of treatment have been discussed with the patient and family. After consideration of risks, benefits and other options for treatment, the patient has consented to  Procedure(s) (LRB): BYPASS GRAFT FEMORAL-POPLITEAL ARTERY (Left) as a surgical intervention .  The patient's history has been reviewed, patient examined, no change in status, stable for surgery.  I have reviewed the patients' chart and labs.  Questions were answered to the patient's satisfaction.     Nancie Bocanegra IV, V. WELLS

## 2011-09-09 NOTE — H&P (View-Only) (Signed)
[facility]  Subjective  -  No complaints today No diarrhea   Physical Exam:  Ischemic changes to all toes on left foot Gen:  NAD CV:  RRR Abd: soft  .Assessment/Plan:  Left foot ischemia  The patients bypass has been delayed secondary to sepsis form C Diff which has now resolved.  The patient has had progression of the ischemia on his left foot, now involving all of his toes.   I discussed proceeding with left femoral popliteal bypass tomorrow and likely a TMA later in the week.  We discussed the risks and benefits, including the risk of wound complications, infection, cardiopulmonary complications.  He is eager to proceed.  Regenia Erck IV, V. WELLS 09/08/2011 8:23 AM --  Filed Vitals:   09/08/11 0730  BP: 155/77  Pulse: 68  Temp:   Resp:     Intake/Output Summary (Last 24 hours) at 09/08/11 0823 Last data filed at 09/07/11 1800  Gross per 24 hour  Intake    220 ml  Output      0 ml  Net    220 ml     Laboratory CBC    Component Value Date/Time   WBC 7.3 09/05/2011 0616   HGB 8.5* 09/05/2011 0616   HCT 27.0* 09/05/2011 0616   PLT 190 09/05/2011 0616    BMET    Component Value Date/Time   NA 136 09/05/2011 0616   K 3.6 09/05/2011 0616   CL 100 09/05/2011 0616   CO2 27 09/05/2011 0616   GLUCOSE 87 09/05/2011 0616   BUN 14 09/05/2011 0616   CREATININE 4.36* 09/05/2011 0616   CALCIUM 7.9* 09/05/2011 0616   GFRNONAA 13* 09/05/2011 0616   GFRAA 15* 09/05/2011 0616    COAG Lab Results  Component Value Date   INR 1.38 08/27/2011   INR 1.54* 08/19/2011   INR 1.62* 08/13/2011   No results found for this basename: PTT    Antibiotics Anti-infectives     Start     Dose/Rate Route Frequency Ordered Stop   09/09/11 0000   vancomycin (VANCOCIN) IVPB 1000 mg/200 mL premix        1,000 mg 200 mL/hr over 60 Minutes Intravenous To Surgery 09/08/11 0823 09/10/11 0000   08/31/11 1830   vancomycin (VANCOCIN) 50 mg/mL oral solution 125 mg        125 mg Oral 4 times per day 08/31/11 1821     08/30/11 1200   vancomycin (VANCOCIN) 750 mg in sodium chloride 0.9 % 150 mL IVPB  Status:  Discontinued        750 mg 150 mL/hr over 60 Minutes Intravenous Every T-Th-Sa (Hemodialysis) 08/29/11 0939 08/31/11 1357   08/29/11 1200   vancomycin (VANCOCIN) 750 mg in sodium chloride 0.9 % 150 mL IVPB  Status:  Discontinued        750 mg 150 mL/hr over 60 Minutes Intravenous Every M-W-F (Hemodialysis) 08/27/11 1157 08/29/11 0939   08/28/11 1530   vancomycin (VANCOCIN) 750 mg in sodium chloride 0.9 % 150 mL IVPB        750 mg 150 mL/hr over 60 Minutes Intravenous Once in dialysis 08/28/11 1301 08/28/11 2014   08/28/11 1200   vancomycin (VANCOCIN) 500 mg in sodium chloride irrigation 0.9 % 100 mL ENEMA  Status:  Discontinued     Comments: Retain for 1 hour      500 mg Rectal 4 times per day 08/28/11 1136 08/31/11 1821   08/27/11 2200  imipenem-cilastatin (PRIMAXIN) 250 mg in sodium chloride 0.9 % 100 mL IVPB        250 mg 200 mL/hr over 30 Minutes Intravenous Every 12 hours 08/27/11 0255 09/03/11 2359   08/27/11 1500   vancomycin (VANCOCIN) 500 mg in sodium chloride irrigation 0.9 % 100 mL ENEMA  Status:  Discontinued     Comments: Retain for 1 hour      500 mg Rectal Every 8 hours 08/27/11 1404 08/28/11 1136   08/27/11 1200   metroNIDAZOLE (FLAGYL) IVPB 500 mg  Status:  Discontinued        500 mg 100 mL/hr over 60 Minutes Intravenous Every 8 hours 08/27/11 0255 09/06/11 0825   08/27/11 1200   vancomycin (VANCOCIN) 750 mg in sodium chloride 0.9 % 150 mL IVPB  Status:  Discontinued        750 mg 150 mL/hr over 60 Minutes Intravenous Every M-W-F (Hemodialysis) 08/27/11 0255 08/27/11 1157   08/27/11 0600   vancomycin (VANCOCIN) 50 mg/mL oral solution 250 mg  Status:  Discontinued        250 mg Oral 4 times per day 08/27/11 0255 08/27/11 1404   08/27/11 0400   vancomycin (VANCOCIN) 500 mg in sodium chloride 0.9 % 100 mL IVPB        500 mg 100  mL/hr over 60 Minutes Intravenous NOW 08/27/11 0255 08/27/11 0530   08/27/11 0230   imipenem-cilastatin (PRIMAXIN) 500 mg in sodium chloride 0.9 % 100 mL IVPB        500 mg 200 mL/hr over 30 Minutes Intravenous  Once 08/27/11 0220 08/27/11 0350   08/27/11 0230   metroNIDAZOLE (FLAGYL) IVPB 500 mg        500 mg 100 mL/hr over 60 Minutes Intravenous  Once 08/27/11 0220 08/27/11 0343   08/27/11 0230   vancomycin (VANCOCIN) IVPB 1000 mg/200 mL premix        1,000 mg 200 mL/hr over 60 Minutes Intravenous  Once 08/27/11 0220 08/27/11 0348   08/27/11 0129   metroNIDAZOLE (FLAGYL) 5-0.79 MG/ML-% IVPB     Comments: PREYER, ELIZABETH: cabinet override         08/27/11 0129 08/27/11 1329           V. Charlena Cross, M.D. [facility] Office: [phone] Pager:  [phone]

## 2011-09-09 NOTE — Brief Op Note (Addendum)
08/26/2011 - 09/09/2011  9:26 PM  PATIENT:  [name]  61 y.o. male  PRE-OPERATIVE DIAGNOSIS:  pvd  POST-OPERATIVE DIAGNOSIS:  Ischemic left leg  PROCEDURE:  Procedure(s) (LRB): left CFA endarterectomy with vein patch angioplasty; BYPASS GRAFT FEMORAL-POPLITEAL ARTERY (Left) with NRGSV from ipsilateral leg  SURGEON:  Surgeon(s) and Role:    [name], MD - Primary  PHYSICIAN ASSISTANT: Della Goo, [facility]  ANESTHESIA:   general  EBL:  Total I/O In: 200 [I.V.:200] Out: 175 [Blood:175]  BLOOD ADMINISTERED:none  DRAINS: JP drain x 1 : Incisional wound vac to left groin  SPECIMEN:  Source of Specimen:  inguinal lymph nodes left groin  DISPOSITION OF SPECIMEN:  PATHOLOGY  COUNTS:  YES  TOURNIQUET:   Total Tourniquet Time Documented: Thigh (Left) - 28 minutes  DICTATION: .Reubin Milan Dictation  PLAN OF CARE: Admit to inpatient   PATIENT DISPOSITION:  PACU - hemodynamically stable.   Delay start of Pharmacological VTE agent (>24hrs) due to surgical blood loss or risk of bleeding: yes

## 2011-09-09 NOTE — Transfer of Care (Signed)
Immediate Anesthesia Transfer of Care Note  Patient: [name]  Procedure(s) Performed: Procedure(s) (LRB): BYPASS GRAFT FEMORAL-POPLITEAL ARTERY (Left)  Patient Location: PACU  Anesthesia Type: General  Level of Consciousness: awake, alert , oriented and patient cooperative  Airway & Oxygen Therapy: Patient Spontanous Breathing and Patient connected to face mask oxygen  Post-op Assessment: Report given to PACU RN, Post -op Vital signs reviewed and stable and Patient moving all extremities X 4  Post vital signs: Reviewed and stable  Complications: No apparent anesthesia complications

## 2011-09-09 NOTE — Preoperative (Signed)
Beta Blockers   Reason not to administer Beta Blockers:Lopressor 1000 today.

## 2011-09-09 NOTE — Progress Notes (Signed)
PT Cancellation Note  Treatment cancelled today due to patient receiving procedure or test.  Noted plan  for Fem-Pop Bypass today; Will follow up postop;   [name] [facility] 09/09/2011, 8:51 AM

## 2011-09-09 NOTE — Progress Notes (Signed)
Subjective:  No complaints. Plan is for fem pop bypass today.    Objective:    Vital signs in last 24 hours: Filed Vitals:   09/08/11 1818 09/08/11 2033 09/09/11 0447 09/09/11 1000  BP: 137/58 148/73 155/57 131/57  Pulse: 67 66 65 68  Temp: 98.8 F (37.1 C) 98.8 F (37.1 C) 98.7 F (37.1 C) 98.9 F (37.2 C)  TempSrc: Oral Oral Oral Oral  Resp: 18 18 18 20   Height:      Weight:  69.6 kg (153 lb 7 oz)    SpO2: 93% 96% 100% 97%   Weight change: -1.5 kg (-3 lb 4.9 oz)  Intake/Output Summary (Last 24 hours) at 09/09/11 1110 Last data filed at 09/09/11 0900  Gross per 24 hour  Intake    360 ml  Output      0 ml  Net    360 ml   Labs: Basic Metabolic Panel:  Lab 09/05/11 4098 09/03/11 1011  NA 136 137  K 3.6 4.2  CL 100 101  CO2 27 24  GLUCOSE 87 51*  BUN 14 20  CREATININE 4.36* 4.75*  ALB -- --  CALCIUM 7.9* 8.1*  PHOS 3.9 4.1   Liver Function Tests:  Lab 09/05/11 0616 09/03/11 1011  AST -- --  ALT -- --  ALKPHOS -- --  BILITOT -- --  PROT -- --  ALBUMIN 1.5* 1.7*   No results found for this basename: LIPASE:3,AMYLASE:3 in the last 168 hours No results found for this basename: AMMONIA:3 in the last 168 hours CBC:  Lab 09/09/11 0617 09/05/11 0616 09/03/11 1011  WBC 9.4 7.3 7.4  NEUTROABS -- -- --  HGB 8.3* 8.5* 8.8*  HCT 26.9* 27.0* 28.1*  MCV 89.7 89.1 90.1  PLT 250 190 159   Cardiac Enzymes: No results found for this basename: CKTOTAL:5,CKMB:5,CKMBINDEX:5,TROPONINI:5 in the last 168 hours CBG:  Lab 09/09/11 0446 09/09/11 0035 09/04/11 0741 09/03/11 2132 09/03/11 1602  GLUCAP 83 89 95 113* 68*    Iron Studies: No results found for this basename: IRON:30,TIBC:30,SATURATION RATIOS:30,TRANSFERRIN:30,FERRITIN:30 in the last 168 hours  Physical Exam:  Blood pressure 131/57, pulse 68, temperature 98.9 F (37.2 C), temperature source Oral, resp. rate 20, height 5\' 6"  (1.676 m), weight 69.6 kg (153 lb 7 oz), SpO2 97.00%.  JXB:JYNWGNFAOZH in  bed YQM:VHQIO RRR, normal S1 and S2  Resp:CTA bilaterally, no rales/rhonchi  NGE:XBMW, flat, NT, BS normal  UXL:KGMW LE forefoot ischemia noted- no LE edema  Impression/Plan 1. Sepsis Syndrome with C diff colitis/PNA: Improved - now on PO vanco only for C diff.  2. C Diff colitis:  on PO vancomycin  3. ESRD: continue MWF HD, next tomorrow via PC.  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  Phos is 3.9 5.   Left foot ischemia: management by [facility]- anticipated fempop today with mid foot amp later in the week   Ronan Duecker A

## 2011-09-09 NOTE — Progress Notes (Signed)
Agree with above.  Aniel Hubble, OTR/L Pager: [phone] 09/09/2011 . 

## 2011-09-09 NOTE — Progress Notes (Signed)
Subjective:  Patient is a 61 y/o gentleman with end-stage renal disease, status post CVA, peripheral vascular disease, hypertension, who was recently hospitalized for her C. difficile colitis and septic shock.  On 62 the patient developed nausea and vomiting in eating, respiratory distress and confusion. He was intubated in the emergency room for respiratory failure and admitted to the pulmonary critical care.  The patient was used for hospital acquired pneumonia with a total of 8 days of Primaxin and has completed his therapy.  The patient was found to have positive C. difficile and was treated with dual therapy including vancomycin and Flagyl by IV. The patient had significant clinical improvement endoscopy IV Flagyl was discontinued and the patient is currently completing his course of oral vancomycin therapy.   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 The patient would had an echo today that femoral popliteal bypass which was delayed secondary to septic shock. During this hospitalization he was evaluated by vascular surgery and they have proceeded with femoral-popliteal bypass scheduled for this afternoon. Please note the patient had already been evaluated by cardiologist and outpatient who essentially cleared him for vascular to proceed with the surgery.  Objective: Filed Vitals:   09/08/11 2033 09/09/11 0447 09/09/11 1000 09/09/11 1359  BP: 148/73 155/57 131/57 144/69  Pulse: 66 65 68 69  Temp: 98.8 F (37.1 C) 98.7 F (37.1 C) 98.9 F (37.2 C) 98.8 F (37.1 C)  TempSrc: Oral Oral Oral Oral  Resp: 18 18 20 20   Height:      Weight: 69.6 kg (153 lb 7 oz)     SpO2: 96% 100% 97% 100%   Weight change: -1.5 kg (-3 lb 4.9 oz)  Intake/Output Summary (Last 24 hours) at 09/09/11 1839 Last data filed at 09/09/11 1736  Gross per 24 hour  Intake    620 ml  Output     25 ml  Net    595 ml      General: Alert, awake, oriented x3, in no acute.  HEENT: [place]/AT PEERL, EOMI Neck: Trachea midline,  no masses, no thyromegal,y no JVD, no carotid bruit OROPHARYNX:  Moist, No exudate/ erythema/lesions.  Heart: Regular rate and rhythm, without murmurs, rubs, gallops, PMI non-displaced, no heaves or thrills on palpation.  Lungs: Clear to auscultation, no wheezing or rhonchi noted. No increased vocal fremitus resonant to percussion  Abdomen: Soft, nontender, nondistended, positive bowel sounds, no masses no hepatosplenomegaly noted..  Neuro: No focal neurological deficits noted  Musculoskeletal: No warm swelling or erythema around joints, no spinal tenderness noted. Extremities: Left foot is cool to touch and there is no drainage from foot.   Lab Results: No results found for this basename: NA:2,K:2,CL:2,CO2:2,GLUCOSE:2,BUN:2,CREATININE:2,CALCIUM:2,MG:2,PHOS:2 in the last 72 hours No results found for this basename: AST:2,ALT:2,ALKPHOS:2,BILITOT:2,PROT:2,ALBUMIN:2 in the last 72 hours No results found for this basename: LIPASE:2,AMYLASE:2 in the last 72 hours  Basename 09/09/11 0617  WBC 9.4  NEUTROABS --  HGB 8.3*  HCT 26.9*  MCV 89.7  PLT 250   No results found for this basename: CKTOTAL:3,CKMB:3,CKMBINDEX:3,TROPONINI:3 in the last 72 hours No components found with this basename: POCBNP:3 No results found for this basename: DDIMER:2 in the last 72 hours No results found for this basename: HGBA1C:2 in the last 72 hours No results found for this basename: CHOL:2,HDL:2,LDLCALC:2,TRIG:2,CHOLHDL:2,LDLDIRECT:2 in the last 72 hours No results found for this basename: TSH,T4TOTAL,FREET3,T3FREE,THYROIDAB in the last 72 hours No results  found for this basename: VITAMINB12:2,FOLATE:2,FERRITIN:2,TIBC:2,IRON:2,RETICCTPCT:2 in the last 72 hours  Micro Results: No results found for this or any previous visit (from the past 240 hour(s)).  Studies/Results: Ct Abdomen Pelvis Wo  Contrast  08/13/2011  *RADIOLOGY REPORT*  Clinical Data: Abdominal pain.  Unresponsive.  CT ABDOMEN AND PELVIS WITHOUT CONTRAST  Technique:  Multidetector CT imaging of the abdomen and pelvis was performed following the standard protocol without intravenous contrast.  Comparison: None.  Findings: Lack of intravenous and oral contrast severely limits the exam.  Moderate bilateral pleural effusions and bibasilar consolidation. Coronary artery calcifications.  Aortic valvular calcifications.  Gallbladder is decompressed.  Liver is within normal limits.  Small amount of free fluid anterior to the liver.  The stomach is markedly distended.  Small bowel loops are also markedly distended.  Distal small bowel is decompressed.  There is a small amount of enteric contrast in the lower abdomen at the midline likely contained within a bowel loop.  Colon is also relatively decompressed.  Small amount of free fluid is seen layering in the pelvis.  The kidneys have a markedly abnormal appearance bilaterally.  The left kidney is enlarged and markedly deformed with areas of curvilinear calcification and mixed density.  Similar changes in the right kidney but to a lesser degree.  Underlying renal mass cannot be excluded.  Dense vascular calcifications are noted.  No obvious free intraperitoneal gas.  Metallic staples are seen in the anterior abdominal wall to the left of midline in the lower abdomen.  Prominent degenerative changes of the hip joints there is a lytic area in  in the right pelvis, above the right acetabulum.  There is loss of cortex in the overlying bone.  There is also a lytic area in the posterior inferior right acetabulum on image 88.  These lytic areas are greater than one would expect with simple degenerative changes.  Aggressive process cannot be excluded.  Gas within venous structures near the right groin are likely iatrogenic.  IMPRESSION: Very limited exam.  Bilateral pleural effusions and bibasilar  consolidation.  Small bowel obstruction pattern.  The transition point is in the lower abdomen.  There are lytic lesions about the right acetabulum as described. An aggressive process such as metastatic disease or myeloma cannot be excluded.  MRI may be helpful.  Markedly abnormal appearance of the kidneys.  Renal mass cannot be excluded.  Original Report Authenticated By: Donavan Burnet, M.D.   Dg Chest 1 View  08/13/2011  *RADIOLOGY REPORT*  Clinical Data: Pneumonia  CHEST - 1 VIEW  Comparison: 08/13/2011  Findings: Diffuse airspace disease throughout the right lung. Small right pleural effusion.  Minimal linear atelectasis at the left base.  Cardiomegaly.  Airspace disease on the left has improved.  IMPRESSION: Improved left airspace disease.  Stable airspace disease throughout the right lung with a right pleural effusion.  Original Report Authenticated By: Donavan Burnet, M.D.   Dg Abd 1 View  08/20/2011  *RADIOLOGY REPORT*  Clinical Data: Abdominal distention.  ABDOMEN - 1 VIEW  Comparison: Plain film the abdomen 08/16/2011 and CT abdomen and pelvis 08/13/2011.  Findings: There are some scattered gas filled but nondilated loops of small bowel.  Small amount of gas is seen in the ascending colon.  NG tube is no longer visualized.  Calcified cystic lesion in the left upper quadrant as seen on CT noted.  Atherosclerotic vascular disease is also noted.  IMPRESSION: Some increase in gas filled but nondilated loops of small bowel  could be due to progressive small bowel obstruction.  Original Report Authenticated By: Bernadene Bell. Maricela Curet, M.D.   Ct Abdomen Pelvis W Contrast  08/27/2011  *RADIOLOGY REPORT*  Clinical Data: Abdominal pain.  Septic.  CT ABDOMEN AND PELVIS WITH CONTRAST  Technique:  Multidetector CT imaging of the abdomen and pelvis was performed following the standard protocol during bolus administration of intravenous contrast.  Contrast: 40mL OMNIPAQUE IOHEXOL 300 MG/ML  SOLN, OMNIPAQUE  IOHEXOL 300 MG/ML  SOLN  Comparison: CT scan 08/13/2011.  Findings: The lung bases demonstrate bilateral pleural effusions and bilateral infiltrates.  The liver is unremarkable and stable.  No focal lesions or intrahepatic biliary dilatation.  Gallstones and gallbladder sludge are suspected.  The pancreas is grossly normal.  The spleen is normal in size.  No focal lesions.  The adrenal glands demonstrate mild nodular enlargement suggesting nodular hyperplasia.  The kidneys demonstrate multiple bilateral rim calcified cysts. The right kidney is small and scarred.  The stomach is distended with contrast.  No obvious abnormality. There is a cluster of small bowel loops in the right mid abdomen which demonstrate mild distention.  Findings suspicious for an early closed loop obstruction or possible compartmentalized small bowel. No pneumatosis or significant wall thickening.  There is enhancement of the mucosa and the vascular pedicle demonstrates enhancement.  The remaining small bowel loops are normal in caliber. The colon is grossly normal.  There is diffuse and fairly marked rectal wall thickening.  The aorta demonstrates advanced atherosclerotic changes.  No focal aneurysm or dissection.  The major branch vessels demonstrate severe atherosclerotic calcifications.  The bladder is grossly normal.  The prostate gland and seminal vesicles are unremarkable.  There is a small amount of free pelvic fluid.  Examination of the bony structures demonstrates stable lytic lesions.  IMPRESSION:  1.  Findings concerning for an early closed loop obstruction with compartmentalized small bowel loops.  Recommend surgical consultation. 2.  Diffuse and marked rectal wall thickening.  This could be infectious or inflammatory.  3.  Severe atherosclerotic changes involving the aorta and branch vessels. 4.  Bilateral infiltrates and effusions. 5.  Severe cystic kidney disease.  Original Report Authenticated By: P. Loralie Champagne, M.D.    Ir Pta Venous Left  08/18/2011  *RADIOLOGY REPORT*  Clinical history:End-stage renal disease and clotted left upper extremity cephalic vein fistula.  PROCEDURE(S): DIALYSIS  FISTULA DECLOT; CEPHALIC VEIN ANGIOPLASTY; ULTRASOUND GUIDANCE FOR VASCULAR ACCESS  Physician: Rachelle Hora. Henn, MD  Medications:Heparin 3000 units, TPA 2 mg  Fluoroscopy time: 22.5 minutes  Contrast: 100 ml Omnipaque 300  Procedure:Informed consent was obtained for a declot procedure. The left upper arm was prepped and draped in a sterile fashion. Maximal barrier sterile technique was utilized including caps, mask, sterile gowns, sterile gloves, sterile drape, hand hygiene and skin antiseptic.  The skin was anesthetized with 1% lidocaine. The fistula was accessed using a 21 gauge needle towards the central venous system with ultrasound guidance.  Ultrasound images were obtained for documentation. Micropuncture catheter was placed. A 6-French vascular sheath was placed.  A Kumpe catheter was advanced in the central veins using a Glidewire.  Central venogram was performed.  The catheter was pulled back as TPA was infused within the cephalic vein thrombus.  Fluoroscopic images were saved for documentation. A Bentson wire was placed centrally and the cephalic vein was treated with the AngioJet thrombectomy device. The cephalic vein was also angioplastied with a 6 mm x 40 mm balloon.  Thrombus was treated  again with the AngioJet device and the PTD thrombectomy device.  Using ultrasound guidance, a 21 gauge needle was directed into the vein towards the arterial anastomosis.  Micropuncture dilator set was placed and a 6 [place] vascular sheath was placed. Although there was already flow from the arterial system, a Fogarty balloon was pulled through the vein in order to dislodge thrombus. There was improved flow within the fistula after using the Fogarty balloon.  The central cephalic vein was angioplastied with 6 mm x 40 mm balloon.  At this point,  there was flow throughout the fistula.  However, the flow was only temporary.  The central cephalic vein and mid humeral cephalic vein required angioplasty multiple times in order to keep the cephalic vein patent.   At the end of the procedure, there was a pulse within the cephalic vein but poor outflow.  The vascular sheaths were removed with pursestring sutures.  Findings:The left cephalic vein fistula anastomosis is near the elbow.  The vein was patent near the arterial anastomosis but occluded in the mid and upper arm.  The central cephalic vein was also patent.  A large amount of the thrombus was removed using the AngioJet and mechanical thrombectomy device.  Eventually, there was some flow throughout the fistula after balloon angioplasty of a tight stenosis in the central cephalic vein and recurrent stenosis in the mid humeral cephalic vein. At the end of the procedure, there was still a large amount of thrombus within the aneurysmal segment of the mid humeral cephalic vein.  Partial recanalization of the upper left arm cephalic vein.  Impression:Declot of the left upper extremity fistula.  There is improved flow through the fistula but the outflow is tenuous and high risk to occlude again.  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  Discussed the declot procedure with Dr. Hyman Hopes.   Recommend trying the fistula for dialysis.  If the fistula occludes again, recommend a catheter and surgical consultation for a new access.  Original Report Authenticated By: Richarda Overlie, M.D.   Ir Fluoro Guide Cv Line Left  08/20/2011  *RADIOLOGY REPORT*  Clinical data: End-stage renal disease.  Occluded arm graft.  Needs access for hemodialysis.  TUNNELED HEMODIALYSIS CATHETER PLACEMENT WITH ULTRASOUND AND FLUOROSCOPIC GUIDANCE:  Comparison: None  Technique: The procedure, risks, benefits, and alternatives were explained to the patient.  Questions regarding the procedure were encouraged and answered.   The patient understands and consents to the procedure. The patient was on adequate   antibiotic coverage, so no additional preprocedural antibiotics were administered. Ultrasound suggested occlusion of the right IJ vein at the thoracic inlet.    Patency of the left IJ vein was confirmed with ultrasound with image documentation. An appropriate skin site was determined. Region was prepped using maximum barrier technique including cap and mask, sterile gown, sterile gloves, large sterile sheet, and Chlorhexidine   as cutaneous antisepsis. The region was infiltrated locally with 1% lidocaine. Under real-time ultrasound guidance, the left IJ vein was accessed with a 21 gauge micropuncture needle; the needle tip within the vein was confirmed with ultrasound image documentation.   Needle exchanged over the 018 guidewire for transitional dilator, which allowed advancement of a Benson wire into the IVC. Over this, an MPA catheter was advanced. A Hemosplit 23 hemodialysis catheter was tunneled from the right anterior chest wall approach to the left IJ dermatotomy site. The MPA catheter was exchanged over an Amplatz wire for serial vascular dilators which allow placement of  a peel-away sheath, through which the catheter was advanced under intermittent fluoroscopy, positioned with its tips in the proximal and midright atrium. Spot chest radiograph confirms good catheter position. No pneumothorax. Catheter was flushed and primed per protocol. Catheter secured externally with O Prolene sutures. The left IJ   dermatotomy site was closed with Dermabond. No immediate complication.  IMPRESSION:  1. Technically successful placement of tunneled left IJ hemodialysis catheter with ultrasound and fluoroscopic guidance. Ready for routine use.  Access management: Remains approachable for percutaneous intervention as needed.  Original Report Authenticated By: Osa Craver, M.D.   Ir Av Dialysis Graft Declot  08/18/2011   *RADIOLOGY REPORT*  Clinical history:End-stage renal disease and clotted left upper extremity cephalic vein fistula.  PROCEDURE(S): DIALYSIS  FISTULA DECLOT; CEPHALIC VEIN ANGIOPLASTY; ULTRASOUND GUIDANCE FOR VASCULAR ACCESS  Physician: Rachelle Hora. Henn, MD  Medications:Heparin 3000 units, TPA 2 mg  Fluoroscopy time: 22.5 minutes  Contrast: 100 ml Omnipaque 300  Procedure:Informed consent was obtained for a declot procedure. The left upper arm was prepped and draped in a sterile fashion. Maximal barrier sterile technique was utilized including caps, mask, sterile gowns, sterile gloves, sterile drape, hand hygiene and skin antiseptic.  The skin was anesthetized with 1% lidocaine. The fistula was accessed using a 21 gauge needle towards the central venous system with ultrasound guidance.  Ultrasound images were obtained for documentation. Micropuncture catheter was placed. A 6-French vascular sheath was placed.  A Kumpe catheter was advanced in the central veins using a Glidewire.  Central venogram was performed.  The catheter was pulled back as TPA was infused within the cephalic vein thrombus.  Fluoroscopic images were saved for documentation. A Bentson wire was placed centrally and the cephalic vein was treated with the AngioJet thrombectomy device. The cephalic vein was also angioplastied with a 6 mm x 40 mm balloon.  Thrombus was treated again with the AngioJet device and the PTD thrombectomy device.  Using ultrasound guidance, a 21 gauge needle was directed into the vein towards the arterial anastomosis.  Micropuncture dilator set was placed and a 6 [place] vascular sheath was placed. Although there was already flow from the arterial system, a Fogarty balloon was pulled through the vein in order to dislodge thrombus. There was improved flow within the fistula after using the Fogarty balloon.  The central cephalic vein was angioplastied with 6 mm x 40 mm balloon.  At this point, there was flow throughout the fistula.   However, the flow was only temporary.  The central cephalic vein and mid humeral cephalic vein required angioplasty multiple times in order to keep the cephalic vein patent.   At the end of the procedure, there was a pulse within the cephalic vein but poor outflow.  The vascular sheaths were removed with pursestring sutures.  Findings:The left cephalic vein fistula anastomosis is near the elbow.  The vein was patent near the arterial anastomosis but occluded in the mid and upper arm.  The central cephalic vein was also patent.  A large amount of the thrombus was removed using the AngioJet and mechanical thrombectomy device.  Eventually, there was some flow throughout the fistula after balloon angioplasty of a tight stenosis in the central cephalic vein and recurrent stenosis in the mid humeral cephalic vein. At the end of the procedure, there was still a large amount of thrombus within the aneurysmal segment of the mid humeral cephalic vein.  Partial recanalization of the upper left arm cephalic vein.  Impression:Declot of the  left upper extremity fistula.  There is improved flow through the fistula but the outflow is tenuous and high risk to occlude again.  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  Discussed the declot procedure with Dr. Hyman Hopes.   Recommend trying the fistula for dialysis.  If the fistula occludes again, recommend a catheter and surgical consultation for a new access.  Original Report Authenticated By: Richarda Overlie, M.D.   Ir Angio Av Shunt Addl Access  08/18/2011  *RADIOLOGY REPORT*  Clinical history:End-stage renal disease and clotted left upper extremity cephalic vein fistula.  PROCEDURE(S): DIALYSIS  FISTULA DECLOT; CEPHALIC VEIN ANGIOPLASTY; ULTRASOUND GUIDANCE FOR VASCULAR ACCESS  Physician: Rachelle Hora. Henn, MD  Medications:Heparin 3000 units, TPA 2 mg  Fluoroscopy time: 22.5 minutes  Contrast: 100 ml Omnipaque 300  Procedure:Informed consent was obtained for a declot  procedure. The left upper arm was prepped and draped in a sterile fashion. Maximal barrier sterile technique was utilized including caps, mask, sterile gowns, sterile gloves, sterile drape, hand hygiene and skin antiseptic.  The skin was anesthetized with 1% lidocaine. The fistula was accessed using a 21 gauge needle towards the central venous system with ultrasound guidance.  Ultrasound images were obtained for documentation. Micropuncture catheter was placed. A 6-French vascular sheath was placed.  A Kumpe catheter was advanced in the central veins using a Glidewire.  Central venogram was performed.  The catheter was pulled back as TPA was infused within the cephalic vein thrombus.  Fluoroscopic images were saved for documentation. A Bentson wire was placed centrally and the cephalic vein was treated with the AngioJet thrombectomy device. The cephalic vein was also angioplastied with a 6 mm x 40 mm balloon.  Thrombus was treated again with the AngioJet device and the PTD thrombectomy device.  Using ultrasound guidance, a 21 gauge needle was directed into the vein towards the arterial anastomosis.  Micropuncture dilator set was placed and a 6 [place] vascular sheath was placed. Although there was already flow from the arterial system, a Fogarty balloon was pulled through the vein in order to dislodge thrombus. There was improved flow within the fistula after using the Fogarty balloon.  The central cephalic vein was angioplastied with 6 mm x 40 mm balloon.  At this point, there was flow throughout the fistula.  However, the flow was only temporary.  The central cephalic vein and mid humeral cephalic vein required angioplasty multiple times in order to keep the cephalic vein patent.   At the end of the procedure, there was a pulse within the cephalic vein but poor outflow.  The vascular sheaths were removed with pursestring sutures.  Findings:The left cephalic vein fistula anastomosis is near the elbow.  The vein was  patent near the arterial anastomosis but occluded in the mid and upper arm.  The central cephalic vein was also patent.  A large amount of the thrombus was removed using the AngioJet and mechanical thrombectomy device.  Eventually, there was some flow throughout the fistula after balloon angioplasty of a tight stenosis in the central cephalic vein and recurrent stenosis in the mid humeral cephalic vein. At the end of the procedure, there was still a large amount of thrombus within the aneurysmal segment of the mid humeral cephalic vein.  Partial recanalization of the upper left arm cephalic vein.  Impression:Declot of the left upper extremity fistula.  There is improved flow through the fistula but the outflow is tenuous and high risk to occlude again.  Residual thrombus  within the aneurysmal segment of the cephalic vein.  Discussed the declot procedure with Dr. Hyman Hopes.   Recommend trying the fistula for dialysis.  If the fistula occludes again, recommend a catheter and surgical consultation for a new access.  Original Report Authenticated By: Richarda Overlie, M.D.   Ir US Guide Vasc Access Left  08/20/2011  *RADIOLOGY REPORT*  Clinical data: End-stage renal disease.  Occluded arm graft.  Needs access for hemodialysis.  TUNNELED HEMODIALYSIS CATHETER PLACEMENT WITH ULTRASOUND AND FLUOROSCOPIC GUIDANCE:  Comparison: None  Technique: The procedure, risks, benefits, and alternatives were explained to the patient.  Questions regarding the procedure were encouraged and answered.  The patient understands and consents to the procedure. The patient was on adequate   antibiotic coverage, so no additional preprocedural antibiotics were administered. Ultrasound suggested occlusion of the right IJ vein at the thoracic inlet.    Patency of the left IJ vein was confirmed with ultrasound with image documentation. An appropriate skin site was determined. Region was prepped using maximum barrier technique including cap and mask, sterile  gown, sterile gloves, large sterile sheet, and Chlorhexidine   as cutaneous antisepsis. The region was infiltrated locally with 1% lidocaine. Under real-time ultrasound guidance, the left IJ vein was accessed with a 21 gauge micropuncture needle; the needle tip within the vein was confirmed with ultrasound image documentation.   Needle exchanged over the 018 guidewire for transitional dilator, which allowed advancement of a Benson wire into the IVC. Over this, an MPA catheter was advanced. A Hemosplit 23 hemodialysis catheter was tunneled from the right anterior chest wall approach to the left IJ dermatotomy site. The MPA catheter was exchanged over an Amplatz wire for serial vascular dilators which allow placement of a peel-away sheath, through which the catheter was advanced under intermittent fluoroscopy, positioned with its tips in the proximal and midright atrium. Spot chest radiograph confirms good catheter position. No pneumothorax. Catheter was flushed and primed per protocol. Catheter secured externally with O Prolene sutures. The left IJ   dermatotomy site was closed with Dermabond. No immediate complication.  IMPRESSION:  1. Technically successful placement of tunneled left IJ hemodialysis catheter with ultrasound and fluoroscopic guidance. Ready for routine use.  Access management: Remains approachable for percutaneous intervention as needed.  Original Report Authenticated By: Osa Craver, M.D.   Ir US Guide Vasc Access Left  08/18/2011  *RADIOLOGY REPORT*  Clinical history:End-stage renal disease and clotted left upper extremity cephalic vein fistula.  PROCEDURE(S): DIALYSIS  FISTULA DECLOT; CEPHALIC VEIN ANGIOPLASTY; ULTRASOUND GUIDANCE FOR VASCULAR ACCESS  Physician: Rachelle Hora. Henn, MD  Medications:Heparin 3000 units, TPA 2 mg  Fluoroscopy time: 22.5 minutes  Contrast: 100 ml Omnipaque 300  Procedure:Informed consent was obtained for a declot procedure. The left upper arm was prepped and  draped in a sterile fashion. Maximal barrier sterile technique was utilized including caps, mask, sterile gowns, sterile gloves, sterile drape, hand hygiene and skin antiseptic.  The skin was anesthetized with 1% lidocaine. The fistula was accessed using a 21 gauge needle towards the central venous system with ultrasound guidance.  Ultrasound images were obtained for documentation. Micropuncture catheter was placed. A 6-French vascular sheath was placed.  A Kumpe catheter was advanced in the central veins using a Glidewire.  Central venogram was performed.  The catheter was pulled back as TPA was infused within the cephalic vein thrombus.  Fluoroscopic images were saved for documentation. A Bentson wire was placed centrally and the cephalic vein was treated with the  AngioJet thrombectomy device. The cephalic vein was also angioplastied with a 6 mm x 40 mm balloon.  Thrombus was treated again with the AngioJet device and the PTD thrombectomy device.  Using ultrasound guidance, a 21 gauge needle was directed into the vein towards the arterial anastomosis.  Micropuncture dilator set was placed and a 6 [place] vascular sheath was placed. Although there was already flow from the arterial system, a Fogarty balloon was pulled through the vein in order to dislodge thrombus. There was improved flow within the fistula after using the Fogarty balloon.  The central cephalic vein was angioplastied with 6 mm x 40 mm balloon.  At this point, there was flow throughout the fistula.  However, the flow was only temporary.  The central cephalic vein and mid humeral cephalic vein required angioplasty multiple times in order to keep the cephalic vein patent.   At the end of the procedure, there was a pulse within the cephalic vein but poor outflow.  The vascular sheaths were removed with pursestring sutures.  Findings:The left cephalic vein fistula anastomosis is near the elbow.  The vein was patent near the arterial anastomosis but  occluded in the mid and upper arm.  The central cephalic vein was also patent.  A large amount of the thrombus was removed using the AngioJet and mechanical thrombectomy device.  Eventually, there was some flow throughout the fistula after balloon angioplasty of a tight stenosis in the central cephalic vein and recurrent stenosis in the mid humeral cephalic vein. At the end of the procedure, there was still a large amount of thrombus within the aneurysmal segment of the mid humeral cephalic vein.  Partial recanalization of the upper left arm cephalic vein.  Impression:Declot of the left upper extremity fistula.  There is improved flow through the fistula but the outflow is tenuous and high risk to occlude again.  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  Discussed the declot procedure with Dr. Hyman Hopes.   Recommend trying the fistula for dialysis.  If the fistula occludes again, recommend a catheter and surgical consultation for a new access.  Original Report Authenticated By: Richarda Overlie, M.D.   Dg Chest Port 1 View  08/30/2011  *RADIOLOGY REPORT*  Clinical Data: Check endotracheal tube placement.  Pneumonia.  PORTABLE CHEST - 1 VIEW  Comparison: Portable chest 08/28/2011.  Findings: The patient has been extubated.  The NG tube was removed as well.  A left IJ dialysis catheter is stable.  There is slight improved aeration of the left lung.  Right-sided airspace disease is not significantly changed.  Bilateral pleural effusions are evident, right greater than left.  Mild cardiomegaly is stable. Pancreatic calcifications are again noted.  IMPRESSION:  1.  Slight improved aeration of the left lung. 2.  Right-sided airspace disease is stable. 3.  Slight increase in bilateral pleural effusions, right greater than left. 4.  The patient has been extubated.  The NG tube was removed as well. 5.  The appearance remains concerning for bilateral pneumonia.  Original Report Authenticated By: Jamesetta Orleans. MATTERN, M.D.   Dg Chest Port 1 View  08/28/2011  *RADIOLOGY REPORT*  Clinical Data: Intubated patient.  PORTABLE CHEST - 1 VIEW  Comparison: Portable chest 08/27/2011.  Findings: Support tubes and lines are unchanged.  Extensive bilateral airspace disease persists.  Aeration in the right chest has worsened.  Right greater than left pleural effusions noted.  IMPRESSION: Extensive bilateral airspace disease shows worsening on the right.  Original Report Authenticated By: Bernadene Bell. D'ALESSIO, M.D.   Dg Chest Portable 1 View  08/27/2011  *RADIOLOGY REPORT*  Clinical Data: Line placement.  PORTABLE CHEST - 1 VIEW  Comparison: 08/26/2011.  Findings: The support apparatus is in good position.  The endotracheal tube is now 4.6 cm above the carina.  Interval progressive airspace consolidation in the left lung could reflect worsening edema or infiltrate.  Small effusions are stable.  IMPRESSION:  1.  Support apparatus in good position without complicating features. 2.  Worsening airspace consolidation, left greater than right could reflect worsening edema or infiltrate. 3.  Stable small effusions.  Original Report Authenticated By: P. Loralie Champagne, M.D.   Dg Chest Port 1 View  08/26/2011  *RADIOLOGY REPORT*  Clinical Data: Endotracheal tube placement.  PORTABLE CHEST - 1 VIEW  Comparison: 08/16/2011.  Findings: The endotracheal tube is right at the right mainstem bronchus and should be retracted 3 cm.  The dialysis catheter is stable.  The NG tube is in the stomach.  There is pulmonary edema, areas of atelectasis and a right pleural effusion.  IMPRESSION: The endotracheal tube is at the right mainstem bronchus and should be retracted 3 cm.  Original Report Authenticated By: P. Loralie Champagne, M.D.   Dg Chest Port 1 View  08/16/2011  *RADIOLOGY REPORT*  Clinical Data: Pneumonia.  Pleural effusion.  PORTABLE CHEST - 1 VIEW  Comparison: 08/15/2011  Findings: Nasogastric tube extends into the stomach.  Cardiomegaly  noted with mild interstitial accentuation and increased obscuration of the right hemidiaphragm primarily ascribed to pleural effusion and associated passive atelectasis.  There is mild atelectasis at the left lung base along with a small left pleural effusion.  Lung volumes are slightly worsened compared to yesterday's exam.  Atherosclerotic calcification of the aortic arch noted. Glenohumeral degenerative findings noted.  IMPRESSION:  1.  Increase in apparent size of the right pleural effusion with associated passive atelectasis. 2.  Small left pleural effusion with left basilar atelectasis also noted. 3.  Cardiomegaly with minimal interstitial accentuation.  Original Report Authenticated By: Dellia Cloud, M.D.   Dg Chest Port 1 View  08/15/2011  *RADIOLOGY REPORT*  Clinical Data: Status post right thoracentesis.  PORTABLE CHEST - 1 VIEW  Comparison: Earlier the same day  Findings: 1556 hours.  Right pleural effusion has decreased in the interval.  No evidence for pneumothorax.  There is some subsegmental atelectasis at the right base. The NG tube passes into the stomach although the distal tip position is not included on the film. The cardiopericardial silhouette is enlarged.  Diffuse interstitial opacity is improved in the interval, suggesting resolving edema.  IMPRESSION: No evidence for pneumothorax after right thoracentesis.  Original Report Authenticated By: ERIC A. MANSELL, M.D.   Dg Chest Port 1 View  08/15/2011  *RADIOLOGY REPORT*  Clinical Data: Shortness of breath and pleural effusion.  PORTABLE CHEST - 1 VIEW  Comparison: 08/14/2011  Findings: The NG tube passes into the stomach although the distal tip position is not included on the film. The cardiopericardial silhouette is enlarged.  Vascular congestion with diffuse interstitial opacity is consistent with edema.  There is bibasilar atelectasis and right pleural effusion.  IMPRESSION: No substantial change.  Cardiomegaly with edema and  right pleural effusion.  Original Report Authenticated By: ERIC A. MANSELL, M.D.   Portable Chest Xray In Am  08/14/2011  *RADIOLOGY REPORT*  Clinical Data: Respiratory failure  PORTABLE CHEST - 1 VIEW  Comparison: Aug 13, 2011  Findings: There  is cardiomegaly and pulmonary edema, as well as a moderate sized right pleural effusion.  Overall, the findings  have not significantly changed.  The nasogastric tube tip courses off the inferior film.  IMPRESSION: No significant change in cardiomegaly, pulmonary edema, moderate sized right pleural effusion.  Original Report Authenticated By: Brandon Melnick, M.D.   Dg Chest Port 1 View  08/13/2011  *RADIOLOGY REPORT*  Clinical Data: Unresponsive.  Shortness of breath and abdominal distention.  PORTABLE CHEST - 1 VIEW  Comparison: 07/31/2011.  Findings: Trachea is midline.  Heart size stable.  There is bilateral air space disease, right greater than left, with progression from 07/31/2011.  Associated right pleural effusion. There may be a tiny left pleural effusion.  IMPRESSION: Worsening bilateral air space disease with an associated right pleural effusion.  Question tiny left pleural effusion.  Congestive heart failure is likely.  Original Report Authenticated By: Reyes Ivan, M.D.   Dg Abd Portable 1v  08/16/2011  *RADIOLOGY REPORT*  Clinical Data: Small bowel obstruction  PORTABLE ABDOMEN - 1 VIEW  Comparison: 08/15/2011  Findings: Vascular calcifications and rim calcified left renal lesions noted.  Nasogastric tube extends into the stomach.  The right femoral line projects over the pelvis.  Left pelvic skin clips noted.  Currently most of the bowel is gasless.  The visualized loops of bowel do not demonstrate bowel dilatation.  Lumbar spondylosis is again noted. Pleural effusions noted at the lung bases, with cardiomegaly.  IMPRESSION:  1.  No dilated bowel is currently visible, although much of the bowel is gasless. 2.  Small right and smaller left pleural  effusions. 3.  Calcified left renal cysts or masses. 4.  Vascular calcifications. 5.  Right femoral line tip projects over the right sacral ala.  Original Report Authenticated By: Dellia Cloud, M.D.   Dg Abd Portable 1v  08/15/2011  *RADIOLOGY REPORT*  Clinical Data: Small bowel obstruction.  PORTABLE ABDOMEN - 1 VIEW  Comparison: Plain films and CT abdomen and pelvis 08/13/2011.  Findings: New NG tube and right femoral catheter are in place. Surgical staples to the left of midline again noted.  Gas filled small bowel loops are again seen but appear less numerous and prominent.  Bilateral upper quadrant calcifications are noted and consistent with renal calcifications seen on CT.  IMPRESSION: Mild improvement small bowel obstruction.  Original Report Authenticated By: Bernadene Bell. Maricela Curet, M.D.   Dg Abd Portable 1v  08/13/2011  *RADIOLOGY REPORT*  Clinical Data: Unresponsive.  Shortness of breath and abdominal distention.  PORTABLE ABDOMEN - 1 VIEW  Comparison: None.  Findings: There is mild gaseous distention of small bowel loops. Some colonic gas and stool are seen.  Surgical skin staples overlie the left lower quadrant.  Peripherally calcified structure in the left paraspinal region has the shape of the left kidney.  Additional tubular calcifications are seen in the left upper quadrant.  There may be small calcifications in the right paraspinal region.  IMPRESSION:  1.  Mild small bowel distention with possible wall thickening.  CT abdomen pelvis with contrast would likely be helpful in further evaluation, as clinically indicated. 2.  Calcified structures in the left upper quadrant, and likely right paraspinal region, as discussed above.  These could also be further assessed with CT.  Original Report Authenticated By: Reyes Ivan, M.D.   Dg Abd Portable 2v  08/28/2011  *RADIOLOGY REPORT*  Clinical Data: Abdominal distention.  Question ileus or small bowel obstruction.  PORTABLE ABDOMEN -  2  VIEW  Comparison: CT abdomen pelvis 08/27/2011 and plain film of the abdomen 08/20/2011.  Findings: The abdomen is nearly gasless.  NG tube and right groin catheter are in place.  Calcified cystic lesion in the left upper quadrant as seen on CT scan again noted.  Multiple calcifications in the spleen are also again seen.  IMPRESSION: The abdomen is nearly gasless making assessment of small bowel difficult.  No definite acute finding.  Original Report Authenticated By: Bernadene Bell. Maricela Curet, M.D.    Medications: I have reviewed the patient's current medications. Scheduled Meds:    . amLODipine  10 mg Oral Daily  . antiseptic oral rinse  15 mL Mouth Rinse q12n4p  . chlorhexidine  15 mL Mouth Rinse BID  . cinacalcet  60 mg Oral Q breakfast  . darbepoetin (ARANESP) injection - DIALYSIS  100 mcg Intravenous Q Wed-HD  . doxercalciferol  6 mcg Intravenous Q M,W,F-HD  . famotidine  20 mg Oral QHS  . feeding supplement (NEPRO CARB STEADY)  237 mL Oral Q24H  . metoprolol tartrate  100 mg Oral BID  . multivitamin  1 tablet Oral QHS  . polyethylene glycol  17 g Oral Once  . sodium chloride  10-40 mL Intracatheter Q12H  . vancomycin  125 mg Oral Q6H  . vancomycin  1,000 mg Intravenous To OR   Continuous Infusions:  PRN Meds:.sodium chloride, 0.9 % irrigation (POUR BTL), fentaNYL, heparin 6000 unit irrigation, heparin, midazolam, morphine injection, oxyCODONE, senna-docusate, sodium chloride Assessment/Plan: Patient Active Hospital Problem List: Septic shock (08/27/2011)   Assessment: Septic shock was associated with fulminant C. difficile infection and hospital-acquired pneumonia. The patient is fully recovered from his septic shock is no hemodynamically stable.diarrhea.    Chronic diastolic CHF (congestive heart failure) (07/23/2010)   Assessment: Clinically well compensated     ESRD (end stage renal disease) ()   Assessment: Hemodialysis dependent and tolerating well by mouth     C. difficile  colitis (08/14/2011)   Assessment:  The patient had 5 days of IV Flagyl and  10/14 days of by mouth vancomycin . Based on clinical progression, Flagyl has ben discontinued and patient continues to improve.  Will complete a total of 14 days of Vancomycin.  CAD (coronary artery disease) (08/14/2011)   Assessment: No chest pain. Information noted regarding cardiology evaluation.    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  Constipation (09/06/2028)   Assessment: Patient has had constipation a few days despite Miralax and Senakot.    LOS: 14 days

## 2011-09-10 ENCOUNTER — Inpatient Hospital Stay (HOSPITAL_COMMUNITY): Payer: Medicare Other

## 2011-09-10 ENCOUNTER — Encounter (HOSPITAL_COMMUNITY): Payer: Self-pay | Admitting: Surgery

## 2011-09-10 DIAGNOSIS — I70269 Atherosclerosis of native arteries of extremities with gangrene, unspecified extremity: Secondary | ICD-10-CM

## 2011-09-10 DIAGNOSIS — A0472 Enterocolitis due to Clostridium difficile, not specified as recurrent: Secondary | ICD-10-CM

## 2011-09-10 DIAGNOSIS — I998 Other disorder of circulatory system: Secondary | ICD-10-CM | POA: Diagnosis present

## 2011-09-10 DIAGNOSIS — N186 End stage renal disease: Secondary | ICD-10-CM

## 2011-09-10 DIAGNOSIS — N2581 Secondary hyperparathyroidism of renal origin: Secondary | ICD-10-CM

## 2011-09-10 DIAGNOSIS — N189 Chronic kidney disease, unspecified: Secondary | ICD-10-CM | POA: Diagnosis present

## 2011-09-10 LAB — RENAL FUNCTION PANEL
CO2: 24 mEq/L (ref 19–32)
Calcium: 7.9 mg/dL — ABNORMAL LOW (ref 8.4–10.5)
GFR calc Af Amer: 13 mL/min — ABNORMAL LOW (ref >90)
GFR calc non Af Amer: 11 mL/min — ABNORMAL LOW (ref >90)
Glucose, Bld: 77 mg/dL (ref 70–99)
Phosphorus: 4.8 mg/dL — ABNORMAL HIGH (ref 2.3–4.6)
Potassium: 3.9 mEq/L (ref 3.5–5.1)
Sodium: 136 mEq/L (ref 135–145)

## 2011-09-10 LAB — CBC
HCT: 23.4 % — ABNORMAL LOW (ref 39.0–52.0)
HCT: 24 % — ABNORMAL LOW (ref 39.0–52.0)
Hemoglobin: 7.3 g/dL — ABNORMAL LOW (ref 13.0–17.0)
Hemoglobin: 7.4 g/dL — ABNORMAL LOW (ref 13.0–17.0)
MCH: 28 pg (ref 26.0–34.0)
MCHC: 30.8 g/dL (ref 30.0–36.0)
MCV: 89.3 fL (ref 78.0–100.0)
MCV: 90.9 fL (ref 78.0–100.0)
RDW: 19 % — ABNORMAL HIGH (ref 11.5–15.5)
RDW: 19.1 % — ABNORMAL HIGH (ref 11.5–15.5)
WBC: 13.3 10*3/uL — ABNORMAL HIGH (ref 4.0–10.5)

## 2011-09-10 LAB — POCT I-STAT 4, (NA,K, GLUC, HGB,HCT)
Glucose, Bld: 79 mg/dL (ref 70–99)
HCT: 29 % — ABNORMAL LOW (ref 39.0–52.0)
Sodium: 138 mEq/L (ref 135–145)

## 2011-09-10 LAB — BASIC METABOLIC PANEL
BUN: 15 mg/dL (ref 6–23)
Chloride: 102 mEq/L (ref 96–112)
Creatinine, Ser: 4.91 mg/dL — ABNORMAL HIGH (ref 0.50–1.35)
GFR calc Af Amer: 13 mL/min — ABNORMAL LOW (ref >90)
Glucose, Bld: 83 mg/dL (ref 70–99)

## 2011-09-10 MED ORDER — DOCUSATE SODIUM 100 MG PO CAPS
100.0000 mg | ORAL_CAPSULE | Freq: Every day | ORAL | Status: DC
  Administered 2011-09-10 – 2011-09-16 (×6): 100 mg via ORAL
  Filled 2011-09-10 (×6): qty 1

## 2011-09-10 MED ORDER — HYDRALAZINE HCL 20 MG/ML IJ SOLN
10.0000 mg | INTRAMUSCULAR | Status: DC | PRN
  Filled 2011-09-10: qty 0.5

## 2011-09-10 MED ORDER — SODIUM CHLORIDE 0.9 % IJ SOLN
INTRAMUSCULAR | Status: AC
  Filled 2011-09-10: qty 10

## 2011-09-10 MED ORDER — ONDANSETRON HCL 4 MG/2ML IJ SOLN: 4.0000 mg | Freq: Four times a day (QID) | INTRAMUSCULAR | Status: DC | PRN

## 2011-09-10 MED ORDER — DOXERCALCIFEROL 4 MCG/2ML IV SOLN
INTRAVENOUS | Status: AC
  Administered 2011-09-10: 6 ug via INTRAVENOUS
  Filled 2011-09-10: qty 4

## 2011-09-10 MED ORDER — MEPERIDINE HCL 25 MG/ML IJ SOLN: 6.2500 mg | INTRAMUSCULAR | Status: DC | PRN

## 2011-09-10 MED ORDER — OXYCODONE HCL 5 MG PO TABS
ORAL_TABLET | ORAL | Status: AC
  Administered 2011-09-10: 5 mg via ORAL
  Filled 2011-09-10: qty 1

## 2011-09-10 MED ORDER — ACETAMINOPHEN 650 MG RE SUPP: 325.0000 mg | RECTAL | Status: DC | PRN

## 2011-09-10 MED ORDER — DEXTROSE 5 % IV SOLN
1.5000 g | Freq: Two times a day (BID) | INTRAVENOUS | Status: AC
  Administered 2011-09-10 (×2): 1.5 g via INTRAVENOUS
  Filled 2011-09-10 (×2): qty 1.5

## 2011-09-10 MED ORDER — LABETALOL HCL 5 MG/ML IV SOLN
10.0000 mg | INTRAVENOUS | Status: DC | PRN
  Filled 2011-09-10: qty 4

## 2011-09-10 MED ORDER — PHENOL 1.4 % MT LIQD: 1.0000 | OROMUCOSAL | Status: DC | PRN

## 2011-09-10 MED ORDER — HYDROMORPHONE HCL PF 1 MG/ML IJ SOLN: 0.2500 mg | INTRAMUSCULAR | Status: DC | PRN

## 2011-09-10 MED ORDER — PROMETHAZINE HCL 25 MG/ML IJ SOLN: 6.2500 mg | INTRAMUSCULAR | Status: DC | PRN

## 2011-09-10 MED ORDER — FAMOTIDINE 20 MG PO TABS
20.0000 mg | ORAL_TABLET | Freq: Every day | ORAL | Status: DC
  Filled 2011-09-10: qty 1

## 2011-09-10 MED ORDER — METOPROLOL TARTRATE 1 MG/ML IV SOLN: 2.0000 mg | INTRAVENOUS | Status: DC | PRN

## 2011-09-10 MED ORDER — DARBEPOETIN ALFA-POLYSORBATE 100 MCG/0.5ML IJ SOLN
INTRAMUSCULAR | Status: AC
  Administered 2011-09-10: 100 ug via INTRAVENOUS
  Filled 2011-09-10: qty 0.5

## 2011-09-10 MED ORDER — KETOROLAC TROMETHAMINE 30 MG/ML IJ SOLN: 15.0000 mg | Freq: Once | INTRAMUSCULAR | Status: DC | PRN

## 2011-09-10 MED ORDER — ACETAMINOPHEN 325 MG PO TABS
325.0000 mg | ORAL_TABLET | ORAL | Status: DC | PRN
  Administered 2011-09-13 – 2011-09-14 (×2): 650 mg via ORAL
  Filled 2011-09-10 (×2): qty 2

## 2011-09-10 MED ORDER — SIMVASTATIN 5 MG PO TABS
5.0000 mg | ORAL_TABLET | Freq: Every day | ORAL | Status: DC
  Administered 2011-09-10 – 2011-09-15 (×6): 5 mg via ORAL
  Filled 2011-09-10 (×7): qty 1

## 2011-09-10 NOTE — Progress Notes (Signed)
MEDICATION RELATED CONSULT NOTE - INITIAL   Pharmacy Consult for Antiobiotic Adjustment  Indication: Post-op Zinacef/Vanc No Known Allergies Patient Measurements: Height: 5\' 6"  (167.6 cm) Weight: 157 lb 10.1 oz (71.5 kg) IBW/kg (Calculated) : 63.8  Labs:  Basename 09/10/11 0730 09/10/11 0500 09/09/11 0617  WBC 11.6* 13.3* 9.4  HGB 7.4* 7.3* 8.3*  HCT 24.0* 23.4* 26.9*  PLT 205 206 250  APTT -- -- --  CREATININE 5.12* 4.91* --  LABCREA -- -- --  CREATININE 5.12* 4.91* --  CREAT24HRUR -- -- --  MG -- -- --  PHOS 4.8* -- --  ALBUMIN 1.9* -- --  PROT -- -- --  ALBUMIN 1.9* -- --  AST -- -- --  ALT -- -- --  ALKPHOS -- -- --  BILITOT -- -- --  BILIDIR -- -- --  IBILI -- -- --   Estimated Creatinine Clearance: 13.5 ml/min (by C-G formula based on Cr of 5.12).  Assessment: 1 YOM s/p L iliofemoral endarterectomy/L fem pop bypass on 6/18 to receive Zinacef 1.5g IV q12 x2 doses and Vanc 1g IV x1. ESRD-HD MWF. In dialysis today. Patient also with Cdiff- on po vancomycin.   WBC 11.6, Afebrile   Plan:  Doses ok- no further adjustment needed.  Pharmacy will sign off.   Fayne Norrie 09/10/2011,9:08 AM

## 2011-09-10 NOTE — Procedures (Signed)
Patient was seen on dialysis and the procedure was supervised.  BFR 300  Via PC BP is  116/63.   Patient appears to be tolerating treatment well  Loucile Posner A 09/10/2011

## 2011-09-10 NOTE — Progress Notes (Signed)
09/10/2011 0330  Pt had a 7 beat run of v tach and I am unable to doppler his L DP. His L PT is still dopplerable. Dr. Myra Gianotti notified about v tach and DP pulse. No new orders received. Will continue to monitor.  [place] Laisa Larrick, RN

## 2011-09-10 NOTE — Anesthesia Postprocedure Evaluation (Signed)
  Anesthesia Post-op Note  Patient: [name]  Procedure(s) Performed: Procedure(s) (LRB): BYPASS GRAFT FEMORAL-POPLITEAL ARTERY (Left)  Patient Location: PACU  Anesthesia Type: General  Level of Consciousness: awake and alert   Airway and Oxygen Therapy: Patient Spontanous Breathing and Patient connected to nasal cannula oxygen  Post-op Pain: mild  Post-op Assessment: Post-op Vital signs reviewed and Patient's Cardiovascular Status Stable  Post-op Vital Signs: stable  Complications: No apparent anesthesia complications

## 2011-09-10 NOTE — Progress Notes (Signed)
  [name], Earl Yonts Brynn   OTR/L Pager: [phone] Office: [phone] .  

## 2011-09-10 NOTE — Progress Notes (Signed)
[facility] [place] TEAM 1 - Stepdown/ICU TEAM  Interim history: This is a 61 y/o male with peripheral vascular disease and ESRD who was recently hospitalized for severe sepsis due to c.diff.  He was readmitted through the [facility] ED on 08/26/11 with respiratory failure and sepsis. His family states that he had been doing well since his d/c home on 6/1, but developed constipation on 6/3. On 6/4 he developed nausea and vomiting in the evening followed by respiratory distress and confusion. He was intubated in the ED for respiratory failure with associated encephalopathy. He last went to HD on 6/3. Etiology for respiratory failure was felt likely related to aspiration versus healthcare acquired pneumonia by pulmonary critical care medicine. He was extubated on 08/29/2011. CT scan performed 08/27/2011 demonstrated a possible closed loop bowel obstruction. Patient was evaluated by surgery and found to have no indications for surgical intervention. The abnormal CT scan findings were likely related to severe ileus from recalcitrant C. difficile colitis. Patient is currently being treated with oral vancomycin with Flagyl and previously this admission received vancomycin enemas. Because of concerns of possible intra-abdominal sepsis and for treatment of healthcare acquired pneumonia patient was started on imipenem and intravenous vancomycin-both of which have been discontinued. Patient improved enough to transfer back to the general medical floor under the care of the hospitalist service. Once patient's initial encephalopathy improved he was complaining of left foot pain and was found to have black discoloration of his toes that was concerning for gangrene. Because of these findings vascular surgery was consulted. Workup did reveal ischemia of the left lower extremity and he subsequently underwent left common femoral artery endarterectomy with vein patch angioplasty and a left femoral to below-the-knee popliteal  artery bypass graft with saphenous vein on 09/09/2011. Patient had previously been scheduled for revascularization procedure prior to this admission. He was aware that he would eventually need transmetatarsal amputation of the left foot after successful revascularization. Because of vascular procedures performed patient was transferred to the step down unit immediately postoperative from the postanesthesia care unit.  Subjective: Today patient is examined during hemodialysis treatment. He has no specific complaints.  He is alert and conversant.  He denies f/c, sob, n/v, or abdom pain.   Objective: Blood pressure 123/48, pulse 82, temperature 99 F (37.2 C), temperature source Oral, resp. rate 17, height 5\' 6"  (1.676 m), weight 70.5 kg (155 lb 6.8 oz), SpO2 96.00%.  Intake/Output from previous day: 06/18 0701 - 06/19 0700 In: 800 [I.V.:800] Out: 260 [Drains:60; Blood:200] Intake/Output this shift: Total I/O In: -  Out: 1015 [Other:1015]  General appearance: cooperative, appears older than stated age and no distress Resp: clear to auscultation bilaterally Cardio: regular rate and sinus rhythm, S1, S2 normal, no murmur, click, rub or gallop GI: soft, non-tender; bowel sounds normal; no masses,  no organomegaly Extremities: no signif LE edema - dry gangrenous toes on L foot Neurologic: Grossly normal  Lab Results:  Basename 09/10/11 0730 09/10/11 0500  WBC 11.6* 13.3*  HGB 7.4* 7.3*  HCT 24.0* 23.4*  PLT 205 206   BMET  Basename 09/10/11 0730 09/10/11 0500  NA 136 137  K 3.9 4.0  CL 99 102  CO2 24 25  GLUCOSE 77 83  BUN 15 15  CREATININE 5.12* 4.91*  CALCIUM 7.9* 8.0*   Medications: I have reviewed the patient's current medications.  Assessment/Plan:  Septic shock *Resolved *Multifactorial etiology related to healthcare acquired pneumonia as well as recalcitrant C. difficile colitis  C. difficile  colitis- recalcitrant *Continue oral vancomycin *Avoid Nexium to avoid  precipitating C. Difficile colitis *Minimize other antibiotic therapy as able  Acute respiratory failure with hypoxia/Hospital-acquired pneumonia *Briefly required intubation during the initial portion of hospitalization *Now tolerating nasal cannula oxygen  *Last chest x-ray 08/29/2011 demonstrated possible bilateral pneumonia so we'll repeat a chest x-ray today *Currently on Zinacef but this appears to have been ordered not for pneumonia but for empiric postoperative coverage after vascular surgery  Peripheral vascular occlusive disease/Ischemia of left foot *Status post revascularization procedure 09/09/2011 *Plans are to proceed with transmetatarsal amputation of left foot on 09/11/2011 *Management per vascular team  CKD (chronic kidney disease) stage V requiring chronic dialysis *Nephrology managing *Dialyzes on Monday Wednesday and Friday  Metabolic encephalopathy *Resolved and related to acute septic illness  Anemia of chronic renal failure - baseline hgb 9-10 *Has received 2 units of packed red blood cells this admission *Given history of coronary artery disease would like to keep hemoglobin greater than or equal to 8.0 *Continue Aranesp  Chronic diastolic CHF (congestive heart failure) *compensated *Keep blood pressure controled *Acute severe exacerbations best treated by urgent dialysis  Hypertension *Continue Norvasc and metoprolol *Blood pressure well controlled *Was on Prinivil as well at home and this is currently on hold since patient's post septic blood pressures are beginning to return to baseline  Atrial fibrillation *Maintaining sinus rhythm  CAD (coronary artery disease) *Continue aspirin, beta blocker and statin *Blood pressure just now rebounding but hopefully can resume Imdur soon  Disposition *Remain in step down to planned article procedure 09/11/2011   LOS: 15 days   Junious Silk, ANP pager [phone]  [facility]  1 [email or web address] Password: [email or web address]  09/10/2011, 12:18 PM  I have personally examined this patient and reviewed the entire database. I have reviewed the above note, made any necessary editorial changes, and agree with its content.  Lonia Blood, MD [facility]

## 2011-09-10 NOTE — Progress Notes (Signed)
Patient back from dialysis, and VVS.  Patient is alert and oriented with c/o pain left leg (will get pain medication for patient).  Left peroneal pulse and PT dopplerable. Left foot/toes still black and painful.  Will continue to monitor.

## 2011-09-10 NOTE — Progress Notes (Signed)
Subjective:  Seen on HD, s/p fem-pop yesterday.  Complaining of operative pain    Objective:    Vital signs in last 24 hours: Filed Vitals:   09/10/11 0730 09/10/11 0800 09/10/11 0830 09/10/11 0900  BP: 113/42 113/66 106/32 126/63  Pulse: 86 80 79 81  Temp:      TempSrc:      Resp: 10 13 16    Height:      Weight:      SpO2:       Weight change: 2.7 kg (5 lb 15.2 oz)  Intake/Output Summary (Last 24 hours) at 09/10/11 0946 Last data filed at 09/10/11 0400  Gross per 24 hour  Intake    800 ml  Output    260 ml  Net    540 ml   Labs: Basic Metabolic Panel:  Lab 09/10/11 4098 09/10/11 0500 09/05/11 0616 09/03/11 1011  NA 136 137 136 137  K 3.9 4.0 3.6 4.2  CL 99 102 100 101  CO2 24 25 27 24   GLUCOSE 77 83 87 51*  BUN 15 15 14 20   CREATININE 5.12* 4.91* 4.36* 4.75*  ALB -- -- -- --  CALCIUM 7.9* 8.0* 7.9* 8.1*  PHOS 4.8* -- 3.9 4.1   Liver Function Tests:  Lab 09/10/11 0730 09/05/11 0616 09/03/11 1011  AST -- -- --  ALT -- -- --  ALKPHOS -- -- --  BILITOT -- -- --  PROT -- -- --  ALBUMIN 1.9* 1.5* 1.7*   No results found for this basename: LIPASE:3,AMYLASE:3 in the last 168 hours No results found for this basename: AMMONIA:3 in the last 168 hours CBC:  Lab 09/10/11 0730 09/10/11 0500 09/09/11 0617 09/05/11 0616  WBC 11.6* 13.3* 9.4 7.3  NEUTROABS -- -- -- --  HGB 7.4* 7.3* 8.3* 8.5*  HCT 24.0* 23.4* 26.9* 27.0*  MCV 90.9 89.3 89.7 89.1  PLT 205 206 250 190   Cardiac Enzymes: No results found for this basename: CKTOTAL:5,CKMB:5,CKMBINDEX:5,TROPONINI:5 in the last 168 hours CBG:  Lab 09/09/11 0446 09/09/11 0035 09/04/11 0741 09/03/11 2132 09/03/11 1602  GLUCAP 83 89 95 113* 68*    Iron Studies: No results found for this basename: IRON:30,TIBC:30,SATURATION RATIOS:30,TRANSFERRIN:30,FERRITIN:30 in the last 168 hours  Physical Exam:  Blood pressure 126/63, pulse 81, temperature 97.4 F (36.3 C), temperature source Oral, resp. rate 16, height 5\' 6"  (1.676  m), weight 71.5 kg (157 lb 10.1 oz), SpO2 98.00%.  JXB:JYNWGNFAOZH in bed YQM:VHQIO RRR, normal S1 and S2  Resp:CTA bilaterally, no rales/rhonchi  NGE:XBMW, flat, NT, BS normal  UXL:KGMW LE forefoot ischemia noted- no LE edema  Impression/Plan 1. Sepsis Syndrome with C diff colitis/PNA: Improved - now on PO vanco only for C diff.  2. C Diff colitis:  on PO vancomycin  3. ESRD: continue MWF HD, receiving HD today.  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  Phos is 3.9 5. HTN/volume- well controlled with HD 6.  Anemia- hgb low/stable- on aranesp 7.   Left foot ischemia: management by VVS- s/p fem-pop with mid foot amp later anticipated. 8. .  Dispo- search in process for SNF   [name] A

## 2011-09-10 NOTE — Clinical Social Work Note (Signed)
CSW followed up by phone with SNF's in [place] regarding bed offers and messages left. CSW contacted by [facility] to advise that bed offer made, however patient will need to change dialysis center to [facility]. Information shared with patient and he is willing to change centers. CSW will continue to monitor patient progress.  Genelle Bal, MSW, LCSW [phone]

## 2011-09-10 NOTE — Progress Notes (Signed)
PT/OT Cancellation Note  Treatment cancelled today due to pt currently in HD.  Will try another time.    Sunny Schlein, [place] [phone] 09/10/2011, 7:59 AM

## 2011-09-10 NOTE — OR Nursing (Signed)
Late entry: Pre procedural time-out completed by Sara Chu RN, but was documented under Time-out. Late entry to correct documentation.

## 2011-09-10 NOTE — Op Note (Signed)
[facility] [place]  Patient name: [name] MRN: [id number] DOB: Apr 28, 1950 Sex: male  08/26/2011 - 09/09/2011 Pre-operative Diagnosis: Ischemic left foot Post-operative diagnosis:  Same Surgeon:  Jorge Ny Assistants:  Della Goo Procedure:   Left iliofemoral endarterectomy   Left femoral to below knee popliteal artery bypass graft with ipsilateral non-reversed translocated greater saphenous vein Anesthesia:  Gen. Blood Loss:  See anesthesia record Specimens:  Left inguinal lymph node   Left femoral plaque  Findings:  Extensive plaque extending into the profunda femoral common femoral and distal external iliac artery which was removed with endarterectomy. The vein was of adequate caliber. At its smallest diameter it was approximately 3 mm. I spatulated the vein to function as a patch and the common femoral artery.  Indications:  The patient was initially seen with ischemic changes to his left toe. He was scheduled for left femoral-popliteal bypass graft however he developed sepsis from Escherichia coli which required a prolonged hospital stay as well as a recurrent admission. Therefore his operation has been delayed. He now has ischemic changes to most of the left forefoot. He understands that he will require transmetatarsal amputation if he has successful revascularization. All his questions are answered prior to his operation.  Procedure:  The patient was identified in the holding area and taken to [facility] OR ROOM 16  The patient was then placed supine on the table. general anesthesia was administered.  The patient was prepped and draped in the usual sterile fashion.  A time out was called and antibiotics were administered.  Ultrasound was used to map the course of the saphenous vein in the left leg. It appeared to be compressible and of adequate caliber. A longitudinal incision was made in the left groin. This was further opened with cautery. There was  extensive lymphadenopathy in the left groin. Lymph nodes were sent to pathology. The left common femoral artery was exposed throughout it's course. I also isolated superficial femoral and profunda femoral arteries. There were 2 profunda branches. The distal external iliac artery was also isolated. The plaque extended up into the distal external iliac artery. I then made a longitudinal below knee incision. I was careful to protect the saphenous vein within this incision. Saphenous vein was fully mobilized and protected. I then opened the fascia and exposed the below knee popliteal artery. The artery was mildly diseased with some soft plaque. I then proceeded with mobilization of the saphenous vein. Branches were divided between 3-0 silk ties and metal clips. Once adequate length of the vein was obtained it was removed. The distal limb was ligated with a 2-0 silk tie. The saphenofemoral junction was oversewn with a 5-0 Prolene in 2 layers. The vein was then prepared on the back table. It distended nicely. It was marked with an ink pen for orientation. A subsartorial tunnel was then created. At this point the patient was fully heparinized. After the heparin had circulated the distal external iliac artery was occluded with a Hanley clamp. The profunda femoral artery was occluded with a baby Gregory clamp. I then used an 11 blade to make an arteriotomy which was extended with Potts scissors. The arteriotomy went from the mid common femoral artery down onto 4 mm of the profunda femoral artery. There was extensive plaque which was circumferential in the common femoral artery. An elevator was used to perform endarterectomy. This extended into the profundofemoral artery for approximately 2 mm. A good distal endpoint was obtained. 7-0 Prolene  tacking sutures were placed to make sure there was no flap present. I then proceeded to perform endarterectomy within the proximal common femoral artery as well as the external iliac  artery. All of the plaque was able to be successfully removed. I then examined the endarterectomized artery and made sure all of the potential embolic debris was removed. The vein was then brought onto the field. The proximal end was spatulated to function as a patch for closure of the common femoral artery and proximal anastomosis. This was approximately a 3 cm anastomosis. I then performed the anastomosis with 5-0 Prolene in a running fashion. Once the anastomosis was completed the clamps released. This was hemostatic. I then used a valvulotome to lyse the valves. Good pulsatile flow was obtained following lysis of the valves. Proper vein orientation was maintained in the vein was brought through the previously created tunnel. At this point a tourniquet was placed on the mid thigh. The leg was exsanguinated with an Esmarch and the tourniquet was inflated for performance of the distal anastomosis. A #11 blade was used to make an arteriotomy in the below knee popliteal artery. This was extended longitudinally with Potts scissors. The leg was straightened and the vein graft cut to the appropriate length. It was then spatulated to fit the size of the arteriotomy. A running anastomosis was then created with 6-0 Prolene. Prior to completion the tourniquet was let down. The appropriate flushing maneuvers were performed. The anastomosis was completed and blood flow was reestablished to the left leg. I interrogated the posterior tibial and anterior tibial artery with Doppler. They had Doppler dependent signals which were biphasic. At this point I reversed the heparin with protamine. Because of the lymphatic drainage from the vein harvest site as well as within the left groin I elected to place a drain within the vein harvest site. A 15 Blake drain was brought out through a separate stab incision and sewn in with a 3-0 nylon. The vein harvest incisions were closed with 2 layers of Vicryl suture. The skin was closed with 4-0  Vicryl. Dermabond placed on the wound. The below knee incision was closed in a layer of 2-0 Vicryl on the fascia a subcutaneous layer of 3-0 Vicryl and 4-0 Vicryl the skin. The groin was closed with multiple layers of 2 and 3-0 Vicryl. Fluoroscopy was used to close the skin. I did place an incisional VAC on the left groin incision. Dermabond placed on the remaining incisions. Patient was successfully extubated and taken to recovery room in stable condition.   Disposition:  To PACU in stable condition.   Juleen China, M.D. [facility] [place] Office: [phone] Pager:  [phone]

## 2011-09-10 NOTE — Progress Notes (Signed)
[facility] [place]  Subjective  - POD #1  Status post left iliofemoral endarterectomy and left femoral to below knee popliteal artery bypass graft with ipsilateral transposed non-reversed saphenous vein   The patient has minimal complaints of pain this morning.   Physical Exam:  All his incisions look appropriate. He has biphasic Doppler signals in the posterior tibial and anterior tibial arteries.  JP with minimal drainage  Left groin wound VAC in place.  Assessment/Plan:  POD #1  Doing very well status post left leg bypass. He will require transmetatarsal education on the left to be done later this week, likely on Friday.  Bray Vickerman IV, V. WELLS 09/10/2011 7:55 AM --  Filed Vitals:   09/10/11 0730  BP: 113/42  Pulse: 86  Temp:   Resp: 10    Intake/Output Summary (Last 24 hours) at 09/10/11 0755 Last data filed at 09/10/11 0400  Gross per 24 hour  Intake    800 ml  Output    260 ml  Net    540 ml     Laboratory CBC    Component Value Date/Time   WBC 11.6* 09/10/2011 0730   HGB 7.4* 09/10/2011 0730   HCT 24.0* 09/10/2011 0730   PLT 205 09/10/2011 0730    BMET    Component Value Date/Time   NA 137 09/10/2011 0500   K 4.0 09/10/2011 0500   CL 102 09/10/2011 0500   CO2 25 09/10/2011 0500   GLUCOSE 83 09/10/2011 0500   BUN 15 09/10/2011 0500   CREATININE 4.91* 09/10/2011 0500   CALCIUM 8.0* 09/10/2011 0500   GFRNONAA 11* 09/10/2011 0500   GFRAA 13* 09/10/2011 0500    COAG Lab Results  Component Value Date   INR 1.38 08/27/2011   INR 1.54* 08/19/2011   INR 1.62* 08/13/2011   No results found for this basename: PTT    Antibiotics Anti-infectives     Start     Dose/Rate Route Frequency Ordered Stop   09/10/11 0200   cefUROXime (ZINACEF) 1.5 g in dextrose 5 % 50 mL IVPB        1.5 g 100 mL/hr over 30 Minutes Intravenous Every 12 hours 09/10/11 0018 09/11/11 0159   09/09/11 0500   vancomycin (VANCOCIN) IVPB 1000 mg/200 mL premix        1,000 mg 200 mL/hr over 60 Minutes Intravenous To Surgery 09/08/11 0823 09/09/11 1543   08/31/11 1830   vancomycin (VANCOCIN) 50 mg/mL oral solution 125 mg        125 mg Oral 4 times per day 08/31/11 1821     08/30/11 1200   vancomycin (VANCOCIN) 750 mg in sodium chloride 0.9 % 150 mL IVPB  Status:  Discontinued        750 mg 150 mL/hr over 60 Minutes Intravenous Every T-Th-Sa (Hemodialysis) 08/29/11 0939 08/31/11 1357   08/29/11 1200   vancomycin (VANCOCIN) 750 mg in sodium chloride 0.9 % 150 mL IVPB  Status:  Discontinued        750 mg 150 mL/hr over 60 Minutes Intravenous Every M-W-F (Hemodialysis) 08/27/11 1157 08/29/11 0939   08/28/11 1530   vancomycin (VANCOCIN) 750 mg in sodium chloride 0.9 % 150 mL IVPB        750 mg 150 mL/hr over 60 Minutes Intravenous Once in dialysis 08/28/11 1301 08/28/11 2014   08/28/11 1200   vancomycin (VANCOCIN) 500 mg in sodium chloride irrigation 0.9 % 100 mL ENEMA  Status:  Discontinued  Comments: Retain for 1 hour      500 mg Rectal 4 times per day 08/28/11 1136 08/31/11 1821   08/27/11 2200   imipenem-cilastatin (PRIMAXIN) 250 mg in sodium chloride 0.9 % 100 mL IVPB        250 mg 200 mL/hr over 30 Minutes Intravenous Every 12 hours 08/27/11 0255 09/03/11 2359   08/27/11 1500   vancomycin (VANCOCIN) 500 mg in sodium chloride irrigation 0.9 % 100 mL ENEMA  Status:  Discontinued     Comments: Retain for 1 hour      500 mg Rectal Every 8 hours 08/27/11 1404 08/28/11 1136   08/27/11 1200   metroNIDAZOLE (FLAGYL) IVPB 500 mg  Status:  Discontinued        500 mg 100 mL/hr over 60 Minutes Intravenous Every 8 hours 08/27/11 0255 09/06/11 0825   08/27/11 1200   vancomycin (VANCOCIN) 750 mg in sodium chloride 0.9 % 150 mL IVPB  Status:  Discontinued        750 mg 150 mL/hr over 60 Minutes Intravenous Every M-W-F (Hemodialysis) 08/27/11 0255 08/27/11 1157   08/27/11 0600   vancomycin (VANCOCIN) 50 mg/mL oral solution 250 mg  Status:  Discontinued          250 mg Oral 4 times per day 08/27/11 0255 08/27/11 1404   08/27/11 0400   vancomycin (VANCOCIN) 500 mg in sodium chloride 0.9 % 100 mL IVPB        500 mg 100 mL/hr over 60 Minutes Intravenous NOW 08/27/11 0255 08/27/11 0530   08/27/11 0230   imipenem-cilastatin (PRIMAXIN) 500 mg in sodium chloride 0.9 % 100 mL IVPB        500 mg 200 mL/hr over 30 Minutes Intravenous  Once 08/27/11 0220 08/27/11 0350   08/27/11 0230   metroNIDAZOLE (FLAGYL) IVPB 500 mg        500 mg 100 mL/hr over 60 Minutes Intravenous  Once 08/27/11 0220 08/27/11 0343   08/27/11 0230   vancomycin (VANCOCIN) IVPB 1000 mg/200 mL premix        1,000 mg 200 mL/hr over 60 Minutes Intravenous  Once 08/27/11 0220 08/27/11 0348   08/27/11 0129   metroNIDAZOLE (FLAGYL) 5-0.79 MG/ML-% IVPB     Comments: PREYER, ELIZABETH: cabinet override         08/27/11 0129 08/27/11 1329           V. Charlena Cross, M.D. [facility] [place] Office: [phone] Pager:  [phone]

## 2011-09-11 DIAGNOSIS — N2581 Secondary hyperparathyroidism of renal origin: Secondary | ICD-10-CM

## 2011-09-11 DIAGNOSIS — A0472 Enterocolitis due to Clostridium difficile, not specified as recurrent: Secondary | ICD-10-CM

## 2011-09-11 DIAGNOSIS — N186 End stage renal disease: Secondary | ICD-10-CM

## 2011-09-11 DIAGNOSIS — I70269 Atherosclerosis of native arteries of extremities with gangrene, unspecified extremity: Secondary | ICD-10-CM

## 2011-09-11 LAB — CBC
Hemoglobin: 6.3 g/dL — CL (ref 13.0–17.0)
MCH: 28.5 pg (ref 26.0–34.0)
MCH: 27.8 pg (ref 26.0–34.0)
MCHC: 31.2 g/dL (ref 30.0–36.0)
MCHC: 31.5 g/dL (ref 30.0–36.0)
MCV: 91.4 fL (ref 78.0–100.0)
MCV: 88.3 fL (ref 78.0–100.0)
Platelets: 186 10*3/uL (ref 150–400)
RBC: 2.21 MIL/uL — ABNORMAL LOW (ref 4.22–5.81)
RDW: 18.2 % — ABNORMAL HIGH (ref 11.5–15.5)

## 2011-09-11 LAB — BASIC METABOLIC PANEL
BUN: 8 mg/dL (ref 6–23)
CO2: 27 mEq/L (ref 19–32)
Calcium: 8.5 mg/dL (ref 8.4–10.5)
Creatinine, Ser: 3.41 mg/dL — ABNORMAL HIGH (ref 0.50–1.35)
GFR calc non Af Amer: 18 mL/min — ABNORMAL LOW (ref >90)
Glucose, Bld: 79 mg/dL (ref 70–99)
Sodium: 135 mEq/L (ref 135–145)

## 2011-09-11 MED ORDER — SODIUM CHLORIDE 0.9 % IJ SOLN
INTRAMUSCULAR | Status: AC
  Filled 2011-09-11: qty 10

## 2011-09-11 MED ORDER — NEPRO/CARBSTEADY PO LIQD
237.0000 mL | Freq: Two times a day (BID) | ORAL | Status: DC
  Administered 2011-09-11 – 2011-09-13 (×3): 237 mL via ORAL
  Filled 2011-09-11 (×13): qty 237

## 2011-09-11 MED ORDER — SODIUM CHLORIDE 0.9 % IJ SOLN
INTRAMUSCULAR | Status: AC
  Administered 2011-09-11: 10 mL
  Filled 2011-09-11: qty 10

## 2011-09-11 MED ORDER — HEPARIN SODIUM (PORCINE) 1000 UNIT/ML DIALYSIS
20.0000 [IU]/kg | INTRAMUSCULAR | Status: DC | PRN
  Filled 2011-09-11: qty 2

## 2011-09-11 MED ORDER — COLLAGENASE 250 UNIT/GM EX OINT
TOPICAL_OINTMENT | Freq: Every day | CUTANEOUS | Status: DC
  Administered 2011-09-11 – 2011-09-15 (×5): via TOPICAL
  Filled 2011-09-11: qty 30

## 2011-09-11 MED ORDER — GLYCERIN (LAXATIVE) 2.1 G RE SUPP
1.0000 | RECTAL | Status: DC | PRN
  Administered 2011-09-11: 1 via RECTAL
  Filled 2011-09-11: qty 1

## 2011-09-11 MED ORDER — CEFAZOLIN SODIUM 1-5 GM-% IV SOLN
1.0000 g | INTRAVENOUS | Status: AC
  Administered 2011-09-11: 1 g via INTRAVENOUS
  Filled 2011-09-11: qty 50

## 2011-09-11 NOTE — Progress Notes (Addendum)
09/11/2011 0510  Received Critical Lab value hemoglobin 6.3,  Pt has not had bowel movement since 09/04/2011, and he has a pressure ulcer to his sacrum. Tama Gander, NP notified. New orders received to transfuse 2 units RBCs, administer a suppository, and a wound care consult was ordered.  Will continue to monitor.   [place] Delores Thelen, RN

## 2011-09-11 NOTE — Progress Notes (Signed)
Nutrition Follow-up  Patient is S/P left common femoral artery endarterectomy with vein patch angioplasty and a left femoral to below-the-knee popliteal artery bypass graft with saphenous vein on 09/09/2011.   Pressure ulcer is healing.  PT following for hydrotherapy.  Diet Order:  Renal 80/90-2-2 Supplements:  Nepro PO once daily.  Previous diet prior to above procedure was Low sodium.  MD and RD had decided patient would benefit from a liberalized diet.  However, when diet was advanced post procedure, Renal diet was re-ordered.  Intake of meals has been poor.   Meds: Scheduled Meds:   . amLODipine  10 mg Oral Daily  . antiseptic oral rinse  15 mL Mouth Rinse q12n4p  . cefUROXime (ZINACEF)  IV  1.5 g Intravenous Q12H  . chlorhexidine  15 mL Mouth Rinse BID  . cinacalcet  60 mg Oral Q breakfast  . collagenase   Topical Daily  . darbepoetin (ARANESP) injection - DIALYSIS  100 mcg Intravenous Q Wed-HD  . docusate sodium  100 mg Oral Daily  . doxercalciferol  6 mcg Intravenous Q M,W,F-HD  . feeding supplement (NEPRO CARB STEADY)  237 mL Oral Q24H  . metoprolol tartrate  100 mg Oral BID  . multivitamin  1 tablet Oral QHS  . simvastatin  5 mg Oral QHS  . sodium chloride  10-40 mL Intracatheter Q12H  . sodium chloride      . sodium chloride      . vancomycin  125 mg Oral Q6H  . DISCONTD: famotidine  20 mg Oral QHS   Continuous Infusions:  PRN Meds:.sodium chloride, acetaminophen, acetaminophen, Glycerin (Adult), heparin, heparin, hydrALAZINE, labetalol, metoprolol, morphine injection, ondansetron, oxyCODONE, phenol, senna-docusate, sodium chloride  Labs:  CMP     Component Value Date/Time   NA 135 09/11/2011 0410   K 4.3 09/11/2011 0410   CL 101 09/11/2011 0410   CO2 27 09/11/2011 0410   GLUCOSE 79 09/11/2011 0410   BUN 8 09/11/2011 0410   CREATININE 3.41* 09/11/2011 0410   CALCIUM 8.5 09/11/2011 0410   PROT 5.4* 08/29/2011 0429   ALBUMIN 1.9* 09/10/2011 0730   AST 31 08/29/2011 0429   ALT 8 08/29/2011 0429   ALKPHOS 60 08/29/2011 0429   BILITOT 0.5 08/29/2011 0429   GFRNONAA 18* 09/11/2011 0410   GFRAA 21* 09/11/2011 0410   Phosphorus  Date/Time Value Range Status  09/10/2011  7:30 AM 4.8* 2.3 - 4.6 mg/dL Final  06/30/8117  1:47 AM 3.9  2.3 - 4.6 mg/dL Final  11/20/5619 30:86 AM 4.1  2.3 - 4.6 mg/dL Final   Sodium  Date/Time Value Range Status  09/11/2011  4:10 AM 135  135 - 145 mEq/L Final  09/10/2011  7:30 AM 136  135 - 145 mEq/L Final  09/10/2011  5:00 AM 137  135 - 145 mEq/L Final   Potassium  Date/Time Value Range Status  09/11/2011  4:10 AM 4.3  3.5 - 5.1 mEq/L Final  09/10/2011  7:30 AM 3.9  3.5 - 5.1 mEq/L Final  09/10/2011  5:00 AM 4.0  3.5 - 5.1 mEq/L Final     Intake/Output Summary (Last 24 hours) at 09/11/11 1152 Last data filed at 09/11/11 0900  Gross per 24 hour  Intake  752.5 ml  Output     50 ml  Net  702.5 ml    Weight Status:  71.5 kg up from 69.4 kg on 6/13  Re-estimated needs:  2000-2200 kcals, 85-95 grams protein, 1.2 liters fluid daily.  Protein needs are  higher due to recent procedure and upcoming procedure to promote healing.  Nutrition Dx:  Inadequate oral intake, ongoing.  Goal:  Intake to meet 90-100% of estimated nutrition needs. Unmet.  Intervention:    Recommend liberalizing diet to Low Sodium as previously discussed with Renal MD.  Increase Nepro to BID to increase intake of protein and kcals.  Monitor:  PO intake, labs, weight trend.   Joaquin Courts, RD, CNSC, LDN Pager #:  [phone]

## 2011-09-11 NOTE — Progress Notes (Signed)
prbc unit complete, no reaction, pt tolerated well. [name]

## 2011-09-11 NOTE — Progress Notes (Signed)
Hydrotherapy Evaluation   09/11/11 1131  Subjective Assessment  Subjective "What do you want me to do?"  Patient and Family Stated Goals Get better  Date of Onset (Present on admission)  Evaluation and Treatment  Evaluation and Treatment Procedures Explained to Patient/Family Yes  Evaluation and Treatment Procedures agreed to  Pressure Ulcer 08/27/11 Stage II -  Partial thickness loss of dermis presenting as a shallow open ulcer with a red, pink wound bed without slough.  Date First Assessed/Time First Assessed: 08/27/11 0800   Location: Sacrum  Staging: Stage II -  Partial thickness loss of dermis presenting as a shallow open ulcer with a red, pink wound bed without slough.  State of Healing Early/partial granulation  Site / Wound Assessment Yellow;Pink;Red;Black  % Wound base Red or Granulating 30%  % Wound base Yellow 60%  % Wound base Black 10%  Wound Length (cm) 8 cm  Wound Width (cm) 4 cm  Wound Depth (cm) 0.6 cm  Drainage Amount Minimal  Drainage Description Other (Comment) (tan)  Treatment Hydrotherapy (Pulse lavage);Debridement (Selective);Packing (Saline gauze)  Dressing Type Moist to dry;Barrier Film (skin prep);ABD;Tape dressing  Dressing Changed  Hydrotherapy  Pulsed Lavage with Suction (psi) 8 psi  Pulsed Lavage with Suction - Normal Saline Used 1000 mL  Pulsed Lavage Tip Tip with splash shield  Pulsed lavage therapy - wound location sacrum  Selective Debridement  Selective Debridement - Location sacrum  Selective Debridement - Tools Used Forceps;Scissors  Selective Debridement - Tissue Removed yellow slough  Wound Therapy - Assess/Plan/Recommendations  Wound Therapy - Clinical Statement Pt present with unstageable sacral wound which can benefit from hydrotherapy to debride necrotic tissue and promote wound healing.  Wound Therapy - Functional Problem List Decr mobility  Factors Delaying/Impairing Wound Healing Immobility;Multiple medical problems;Polypharmacy    Hydrotherapy Plan Debridement;Dressing change;Patient/family education;Pulsatile lavage with suction  Wound Therapy - Frequency 6X / week  Wound Therapy - Follow Up Recommendations Home health RN  Wound Therapy Goals - Improve the function of patient's integumentary system by progressing the wound(s) through the phases of wound healing by:  Decrease Necrotic Tissue to 25  Decrease Necrotic Tissue - Progress Goal set today  Increase Granulation Tissue to 75  Increase Granulation Tissue - Progress Goal set today    [facility] PT [phone]

## 2011-09-11 NOTE — Progress Notes (Signed)
CRITICAL VALUE ALERT  Critical value received:  Hemoglobin 6.3  Date of notification:  09/11/2011   Time of notification:  0450  Critical value read back:yes  Nurse who received alert:  [place] Troi Bechtold, RN  MD notified (1st page):  Tama Gander, NP  Time of first page:  0500  MD notified (2nd page):  Time of second page:  Responding MD:  Clearence Ped, NP  Time MD responded:  0505   [place] Hymie Gorr, RN

## 2011-09-11 NOTE — Progress Notes (Signed)
Utilization review completed.  

## 2011-09-11 NOTE — Progress Notes (Signed)
Clinical Social Work  CSW and [employer] were discussing [name]. Per CM, LTAC felt patient would be appropriate for LTAC if patient was interested. CSW met with patient who stated he would prefer to go to SNF [facility]) in order to be closer to home. Patient is aware that he would need to change dialysis centers and is agreeable to this plan. CSW informed CM of patient's desires. CSW will continue to follow.  [place], [place] [phone]

## 2011-09-11 NOTE — Progress Notes (Signed)
OT Cancellation Note  Treatment cancelled today due to medical issues with patient which prohibited therapy (hgb 6.3) OT to continue to follow acutely and check back later today.  Harrel Carina [facility] OTR/L Pager: [phone]  09/11/2011, 7:51 AM

## 2011-09-11 NOTE — Progress Notes (Addendum)
[facility] [place]  Progress Note Bypass Surgery  Date of Surgery: 08/26/2011 - 09/09/2011  Procedure(s): Left BYPASS GRAFT FEMORAL-POPLITEAL ARTERY Surgeon: Surgeon(s): [name], MD  2 Days Post-Op  History of Present Illness  [name] is a 61 y.o. male who is S/P Procedure(s):left BYPASS GRAFT FEMORAL-POPLITEAL ARTERY  The patient's pre-op symptoms of pain in foot are Improved . Patients pain is well controlled.    VASC. LAB Studies:        ABI: pending   Significant Diagnostic Studies: CBC Lab Results  Component Value Date   WBC 8.7 09/11/2011   HGB 6.3* 09/11/2011   HCT 20.2* 09/11/2011   MCV 91.4 09/11/2011   PLT 195 09/11/2011    BMET     Component Value Date/Time   NA 135 09/11/2011 0410   K 4.3 09/11/2011 0410   CL 101 09/11/2011 0410   CO2 27 09/11/2011 0410   GLUCOSE 79 09/11/2011 0410   BUN 8 09/11/2011 0410   CREATININE 3.41* 09/11/2011 0410   CALCIUM 8.5 09/11/2011 0410   GFRNONAA 18* 09/11/2011 0410   GFRAA 21* 09/11/2011 0410    COAG Lab Results  Component Value Date   INR 1.38 08/27/2011   INR 1.54* 08/19/2011   INR 1.62* 08/13/2011   No results found for this basename: PTT    Physical Examination  BP Readings from Last 3 Encounters:  09/11/11 136/63  09/11/11 136/63  08/23/11 122/68   Temp Readings from Last 3 Encounters:  09/11/11 98.4 F (36.9 C) Oral  09/11/11 98.4 F (36.9 C) Oral  08/23/11 98.1 F (36.7 C) Oral   SpO2 Readings from Last 3 Encounters:  09/11/11 98%  09/11/11 98%  08/23/11 92%   Pulse Readings from Last 3 Encounters:  09/10/11 100  09/10/11 100  08/23/11 103    Pt is A&O x 3 left lower extremity: Incision/s is/are clean,dry.intact, and  healing without hematoma, erythema or drainage Limb is warm; with good color Decreased sensation top of left foot Toes 2-4 demarcating on left Right great toe with some demarcating - good sensation  Right Dorsalis Pedis pulse is monophasic by  Doppler RightPosterior tibial pulse is  weak and monophasic by Doppler  Left Dorsalis Pedis pulse is monophasic by Doppler LeftPosterior tibial pulse is  absent   Assessment/Plan: Pt. Doing well Post-op pain is controlled Wounds are healing well PT/OT for ambulation Continue wound care as ordered JP drain with 50cc overnight Hold on DVT prophylaxis sec to acute blood loss anemia and elevated PT/PTT Acute on chronic anemia - 2 UPC today Repeat labs in am Possible TMA tomorrow  [place] [phone] 09/11/2011 7:39 AM   Agree with above For Left TMA tomorrow  Durene Cal

## 2011-09-11 NOTE — Consult Note (Addendum)
WOC consult Note Reason for Consult: Consult requested for sacrum wound.  Vascular team following for assessment and plan of care to groin wound, will not plan to follow this site or negative pressure to area unless re-consulted.  Wound type: Sacrum pressure ulcer noted as stage 2,  present on admission on 6/4.  Pt states "I've had boils there before that I had to lance and they drained pus."  Pt currently on Vancomycin p.o.  Wound appears to have declined at this time. Now unstageable.  Multiple systemic factors can impair healing; ESRD, immobility, sepsis, C-diff and difficult to keep wound from soiling with stool, albumin 2.1. Pressure Ulcer POA: Yes Measurement:7X4X.3cm Opening is a pinhole in middle of wound. Wound bed:60%yellow, 40% red Drainage (amount, consistency, odor) Mod tan drainage, some slight odor.   Periwound: Intact skin surrounding. Dressing procedure/placement/frequency: Hydrotherapy by physical therapy to assist with removal of non-viable tissue.  Discussed plan of care with [place], [place] from [facility].  Expect wound to increase in size and depth as nonviable tissue is removed.  Santyl for chemical debridement.  Air mattress to decrease pressure.  Optimize nutrition.  Cammie Mcgee, RN, MSN, [employer]  [phone]

## 2011-09-11 NOTE — Progress Notes (Signed)
Physical Therapy Treatment Patient Details Name: MAT STUARD MRN: [id number] DOB: 01/14/1951 Today's Date: 09/11/2011 Time: 4098-1191 PT Time Calculation (min): 27 min  PT Assessment / Plan / Recommendation Comments on Treatment Session  Patient present with sepsis, PNA, Resp failure and c-diff. Patient able to tolerate increased activity today. Patients Hgb from 6.3 to 7.6 this afternoon. Patient with no complaints of lightheadedness or dizziness.     Follow Up Recommendations  Skilled nursing facility    Barriers to Discharge        Equipment Recommendations  Defer to next venue    Recommendations for Other Services    Frequency Min 2X/week   Plan Discharge plan remains appropriate;Frequency remains appropriate    Precautions / Restrictions Precautions Precautions: Fall Precaution Comments: wound vac, jp drain, iv in Rt thigh Restrictions Weight Bearing Restrictions: No   Pertinent Vitals/Pain Patient reports 10/10 Left leg pain    Mobility  Bed Mobility Supine to Sit: 1: +2 Total assist;With rails Supine to Sit: Patient Percentage: 40% Sitting - Scoot to Edge of Bed: 3: Mod assist Details for Bed Mobility Assistance: A required to position LEs and to lift shoulders/trunk into sitting position. Cues for safe technique throughout.  Transfers Sit to Stand: 1: +2 Total assist;From elevated surface;With upper extremity assist;From bed Sit to Stand: Patient Percentage: 50% Stand to Sit: To chair/3-in-1;1: +2 Total assist;With upper extremity assist Stand to Sit: Patient Percentage: 50% Stand Pivot Transfers: 1: +2 Total assist;With armrests Stand Pivot Transfers: Patient Percentage: 50% Details for Transfer Assistance: Patient able to transfer to recliner utilizing pivotal hops. Patient requiring tactile and verbal cues for postioning and pivoting towards right side shifting hips towards recliner. A to initiate stand and to ensure balance. Patient also requiring cues for  posture and to control descent onto recliner.     Exercises     PT Diagnosis:    PT Problem List:   PT Treatment Interventions:     PT Goals Acute Rehab PT Goals PT Goal: Supine/Side to Sit - Progress: Progressing toward goal PT Goal: Sit to Stand - Progress: Progressing toward goal PT Transfer Goal: Bed to Chair/Chair to Bed - Progress: Progressing toward goal  Visit Information  Last PT Received On: 09/11/11 Assistance Needed: +2    Subjective Data      Cognition  Overall Cognitive Status: Impaired Area of Impairment: Safety/judgement Arousal/Alertness: Awake/alert Orientation Level: Appears intact for tasks assessed Behavior During Session: [facility] for tasks performed Following Commands: Follows one step commands consistently Safety/Judgement: Impulsive    Balance  Static Sitting Balance Static Sitting - Balance Support: Feet supported;Bilateral upper extremity supported Static Sitting - Level of Assistance: 5: Stand by assistance Static Sitting - Comment/# of Minutes: Patient able to sit EOB in prep for standing with cues for postioning to maintain upright balance.   End of Session PT - End of Session Equipment Utilized During Treatment: Gait belt Activity Tolerance: Patient tolerated treatment well Patient left: in chair;with call bell/phone within reach Nurse Communication: Mobility status    Deadrick Stidd, Adline Potter 09/11/2011, 2:21 PM

## 2011-09-11 NOTE — Progress Notes (Signed)
09/11/11 0555  Pt pre transfusion temperature 100.1. Schorr, NP notified. Orders to proceed with transfusion. Will continue to monitor.   [name], RN

## 2011-09-11 NOTE — Progress Notes (Signed)
Occupational Therapy Treatment Patient Details Name: [name] MRN: [id number] DOB: April 01, 1950 Today's Date: 09/11/2011 Time: 4098-1191 OT Time Calculation (min): 27 min  OT Assessment / Plan / Recommendation Comments on Treatment Session Pt progressing this session from supine<> sitting EOB. PT receiving blood today with Hgb increase 7.3     Follow Up Recommendations  Skilled nursing facility    Barriers to Discharge       Equipment Recommendations  Defer to next venue    Recommendations for Other Services    Frequency Min 2X/week   Plan Discharge plan remains appropriate    Precautions / Restrictions Precautions Precautions: Fall Precaution Comments: wound vac, jp drain, iv in Rt thigh Restrictions Weight Bearing Restrictions: No   Pertinent Vitals/Pain 10 out 10 RN Carole getting pain medication at end of session    ADL  Eating/Feeding: Performed;Supervision/safety Where Assessed - Eating/Feeding: Chair Grooming: Performed;Wash/dry face;Set up Where Assessed - Grooming: Supine, head of bed up Lower Body Dressing: Performed;+1 Total assistance Where Assessed - Lower Body Dressing: Supine, head of bed up Toilet Transfer: Simulated;+2 Total assistance Toilet Transfer: Patient Percentage: 50% Toilet Transfer Method: Sit to stand Toilet Transfer Equipment: Raised toilet seat with arms (or 3-in-1 over toilet) Equipment Used: Gait belt ADL Comments: Pt supine on arrival asleep with lunch on bedside table. PT agreeable to OOB to chair. Pt required facilitation of weight shift and multimodal cueing for transfers.      OT Goals Acute Rehab OT Goals OT Goal Formulation: With patient Time For Goal Achievement: 09/25/11 Potential to Achieve Goals: Good ADL Goals Pt Will Perform Grooming: with supervision;Standing at sink ADL Goal: Grooming - Progress: Progressing toward goals Pt Will Perform Upper Body Dressing: with set-up;with supervision;Standing ADL Goal: Upper  Body Dressing - Progress: Progressing toward goals Pt Will Perform Lower Body Dressing: with supervision;Sit to stand from bed ADL Goal: Lower Body Dressing - Progress: Not progressing Pt Will Transfer to Toilet: with supervision;with DME;3-in-1 ADL Goal: Toilet Transfer - Progress: Progressing toward goals Additional ADL Goal #1: Pt will transition from supine to sitting with supervision in preparation for selfcare tasks. ADL Goal: Additional Goal #1 - Progress: Progressing toward goals  Visit Information  Last OT Received On: 09/11/11 Assistance Needed: +2 PT/OT Co-Evaluation/Treatment: Yes               Cognition  Overall Cognitive Status: Impaired Area of Impairment: Safety/judgement Arousal/Alertness: Awake/alert Orientation Level: Appears intact for tasks assessed Behavior During Session: [facility] for tasks performed Following Commands: Follows one step commands consistently Safety/Judgement: Impulsive    Mobility Bed Mobility Supine to Sit: 1: +2 Total assist;With rails Supine to Sit: Patient Percentage: 40% Sitting - Scoot to Edge of Bed: 3: Mod assist Details for Bed Mobility Assistance: A required to position LEs and to lift shoulders/trunk into sitting position. Cues for safe technique throughout.  Transfers Sit to Stand: 1: +2 Total assist;From elevated surface;With upper extremity assist;From bed Sit to Stand: Patient Percentage: 50% Stand to Sit: To chair/3-in-1;1: +2 Total assist;With upper extremity assist Stand to Sit: Patient Percentage: 50% Details for Transfer Assistance: Patient able to transfer to recliner utilizing pivotal hops. Patient requiring tactile and verbal cues for postioning and pivoting towards right side shifting hips towards recliner. A to initiate stand and to ensure balance. Patient also requiring cues for posture and to control descent onto recliner.    Exercises    Balance Static Sitting Balance Static Sitting - Balance Support: Feet  supported;Bilateral upper extremity supported  Static Sitting - Level of Assistance: 5: Stand by assistance Static Sitting - Comment/# of Minutes: Patient able to sit EOB in prep for standing with cues for postioning to maintain upright balance.   End of Session OT - End of Session Activity Tolerance: Patient tolerated treatment well Patient left: in chair;with call bell/phone within reach Nurse Communication: Mobility status   Harrel Carina [facility] 09/11/2011, 2:24 PM Pager: [phone]

## 2011-09-11 NOTE — Progress Notes (Signed)
[facility] [place] TEAM 1 - Stepdown/ICU TEAM  Interim history: This is a 61 y/o male with peripheral vascular disease and ESRD who was recently hospitalized for severe sepsis due to c.diff.  He was readmitted through the [facility] ED on 08/26/11 with respiratory failure and sepsis. His family states that he had been doing well since his d/c home on 6/1, but developed constipation on 6/3. On 6/4 he developed nausea and vomiting in the evening followed by respiratory distress and confusion. He was intubated in the ED for respiratory failure with associated encephalopathy. He last went to HD on 6/3. Etiology for respiratory failure was felt likely related to aspiration versus healthcare acquired pneumonia by pulmonary critical care medicine. He was extubated on 08/29/2011. CT scan performed 08/27/2011 demonstrated a possible closed loop bowel obstruction. Patient was evaluated by surgery and found to have no indications for surgical intervention. The abnormal CT scan findings were likely related to severe ileus from recalcitrant C. difficile colitis. Patient is currently being treated with oral vancomycin with Flagyl and previously this admission received vancomycin enemas. Because of concerns of possible intra-abdominal sepsis and for treatment of healthcare acquired pneumonia patient was started on imipenem and intravenous vancomycin-both of which have been discontinued. Patient improved enough to transfer back to the general medical floor under the care of the hospitalist service. Once patient's initial encephalopathy improved he was complaining of left foot pain and was found to have black discoloration of his toes that was concerning for gangrene. Because of these findings vascular surgery was consulted. Workup did reveal ischemia of the left lower extremity and he subsequently underwent left common femoral artery endarterectomy with vein patch angioplasty and a left femoral to below-the-knee popliteal  artery bypass graft with saphenous vein on 09/09/2011. Patient had previously been scheduled for revascularization procedure prior to this admission. He was aware that he would possibly need transmetatarsal amputation of the left foot after successful revascularization. Because of vascular procedures performed patient was transferred to the step down unit immediately postoperative from the postanesthesia care unit.  Subjective: Alert and endorses feels much better today. No specific complaints.  Objective: Blood pressure 133/55, pulse 100, temperature 98.8 F (37.1 C), temperature source Oral, resp. rate 16, height 5\' 6"  (1.676 m), weight 71.5 kg (157 lb 10.1 oz), SpO2 98.00%.  Intake/Output from previous day: 06/19 0701 - 06/20 0700 In: 432.5 [I.V.:360; Blood:12.5] Out: 1065 [Drains:50] Intake/Output this shift: Total I/O In: 320 [P.O.:240; I.V.:80] Out: 0   General appearance: cooperative, appears older than stated age and no distress Resp: clear to auscultation bilaterally Cardio: regular rate and sinus rhythm, S1, S2 normal, no murmur, click, rub or gallop GI: soft, non-tender; bowel sounds normal; no masses,  no organomegaly Extremities: no signif LE edema - dry gangrenous toes on L foot Neurologic: Grossly normal  Lab Results:  Basename 09/11/11 0410 09/10/11 0730  WBC 8.7 11.6*  HGB 6.3* 7.4*  HCT 20.2* 24.0*  PLT 195 205   BMET  Basename 09/11/11 0410 09/10/11 0730  NA 135 136  K 4.3 3.9  CL 101 99  CO2 27 24  GLUCOSE 79 77  BUN 8 15  CREATININE 3.41* 5.12*  CALCIUM 8.5 7.9*   Medications: I have reviewed the patient's current medications.  Assessment/Plan:  Septic shock *Resolved *Multifactorial etiology related to healthcare acquired pneumonia as well as recalcitrant C. difficile colitis  C. difficile colitis- recalcitrant *Continue oral vancomycin *Avoid Nexium to avoid precipitating C. Difficile colitis *Minimize other antibiotic therapy  as  able  Acute respiratory failure with hypoxia/Hospital-acquired pneumonia *Briefly required intubation during the initial portion of hospitalization *Now tolerating nasal cannula oxygen  *Last chest x-ray 08/29/2011 demonstrated possible bilateral pneumonia so repeat chest x-ray 09/10/2011 shows no evidence of pneumonia *Currently on Zinacef but this appears to have been ordered not for pneumonia but for empiric postoperative coverage after vascular surgery  Peripheral vascular occlusive disease/Ischemia of left foot *Status post revascularization procedure 09/09/2011 *Vascular surgery continues to monitor regarding the possibility of a left transmetatarsal amputation *Management per vascular team  CKD (chronic kidney disease) stage V requiring chronic dialysis *Nephrology managing *Dialyzes on Monday Wednesday and Friday  Metabolic encephalopathy *Resolved and related to acute septic illness  Anemia of chronic renal failure - baseline hgb 9-10 *Has received 2 units of packed red blood cells so far this this admission *Given history of coronary artery disease would like to keep hemoglobin greater than or equal to 8.0 *Hemoglobin today has decreased further to 6.3. On-call staff ordered 2 units of packed red blood cells overnight the patient not scheduled for dialysis again until tomorrow so we'll go ahead and give 1 unit now and defer to renal weather from a respiratory and renal standpoint he is stable enough to receive the second unit of blood. *Repeat CBC at 12 PM is 7/6; repeat again in the morning with dialysis *Continue Aranesp  Chronic diastolic CHF (congestive heart failure) *compensated *Keep blood pressure controled *Acute severe exacerbations best treated by urgent dialysis  Hypertension *Continue Norvasc and metoprolol *Blood pressure well controlled *Was on Prinivil as well at home and this is currently on hold since patient's post septic blood pressures are still  somewhat low but are beginning to return to baseline  Atrial fibrillation *Maintaining sinus rhythm  CAD (coronary artery disease) *Continue aspirin, beta blocker and statin *Blood pressure just now rebounding but hopefully can resume Imdur soon  Disposition *Remain in step down to planned vascular procedure 09/11/2011   LOS: 16 days   Junious Silk, ANP pager [phone]  [facility] [email or web address] Password: [email or web address]  09/11/2011, 11:42 AM  I have examined the patient and reviewed the chart. Will likely need another Unit of blood tomorrow esp as he is having surgery. [facility] Will need to consult them tomorrow.   Calvert Cantor, MD [phone]

## 2011-09-11 NOTE — Progress Notes (Deleted)
Physical Therapy Treatment Patient Details Name: [name] MRN: [id number] DOB: [birth year] Today's Date: 09/11/2011 Time: 4098-1191 PT Time Calculation (min): 27 min  PT Assessment / Plan / Recommendation Comments on Treatment Session  Patient present with sepsis, PNA, Resp failure and c-diff. Patient able to tolerate increased activity today. Patients Hgb from 6.3 to 7.6 this afternoon. Patient with no complaints of lightheadedness or dizziness.     Follow Up Recommendations  Skilled nursing facility    Barriers to Discharge        Equipment Recommendations  Defer to next venue    Recommendations for Other Services    Frequency Min 2X/week   Plan Discharge plan remains appropriate;Frequency remains appropriate    Precautions / Restrictions Precautions Precautions: Fall Precaution Comments: Patient with multiple lines and tubes.  Restrictions Weight Bearing Restrictions: No   Pertinent Vitals/Pain     Mobility  Bed Mobility Supine to Sit: 1: +2 Total assist;With rails Supine to Sit: Patient Percentage: 40% Sitting - Scoot to Edge of Bed: 3: Mod assist Details for Bed Mobility Assistance: A required to position LEs and to lift shoulders/trunk into sitting position. Cues for safe technique throughout.  Transfers Sit to Stand: 1: +2 Total assist;From elevated surface;With upper extremity assist;From bed Sit to Stand: Patient Percentage: 50% Stand to Sit: To chair/3-in-1;1: +2 Total assist;With upper extremity assist Stand to Sit: Patient Percentage: 50% Stand Pivot Transfers: 1: +2 Total assist;With armrests Stand Pivot Transfers: Patient Percentage: 50% Details for Transfer Assistance: Patient able to transfer to recliner utilizing pivotal hops. Patient requiring tactile and verbal cues for postioning and pivoting towards right side shifting hips towards recliner. A to initiate stand and to ensure balance. Patient also requiring cues for posture and to control descent  onto recliner.     Exercises     PT Diagnosis:    PT Problem List:   PT Treatment Interventions:     PT Goals Acute Rehab PT Goals PT Goal: Supine/Side to Sit - Progress: Progressing toward goal PT Goal: Sit to Stand - Progress: Progressing toward goal PT Transfer Goal: Bed to Chair/Chair to Bed - Progress: Progressing toward goal  Visit Information  Last PT Received On: 09/11/11 Assistance Needed: +2    Subjective Data      Cognition  Overall Cognitive Status: Impaired Area of Impairment: Safety/judgement Arousal/Alertness: Awake/alert Orientation Level: Appears intact for tasks assessed Behavior During Session: [facility] for tasks performed Following Commands: Follows one step commands consistently Safety/Judgement: Impulsive    Balance  Static Sitting Balance Static Sitting - Balance Support: No upper extremity supported;Feet supported Static Sitting - Level of Assistance: 5: Stand by assistance Static Sitting - Comment/# of Minutes: Patient able to sit EOB in prep for standing with cues for postioning to maintain upright balance.   End of Session PT - End of Session Equipment Utilized During Treatment: Gait belt Activity Tolerance: Patient tolerated treatment well Patient left: in chair;with call bell/phone within reach Nurse Communication: Mobility status    Fredrich Birks 09/11/2011, 2:19 PM  09/11/2011 Fredrich Birks PTA [phone] pager [phone] office

## 2011-09-11 NOTE — OR Nursing (Signed)
[name]  Did all documentation on OR record after 1920 and she did the verification  at the end of the surgical procedure

## 2011-09-11 NOTE — Progress Notes (Signed)
09/11/2011 Fredrich Birks PTA [phone] pager [phone] office

## 2011-09-11 NOTE — Progress Notes (Signed)
Subjective:  Had HD yesterday, removed 1 liter, tolerated well.  Plan is for midfoot amp ?when  Objective:    Vital signs in last 24 hours: Filed Vitals:   09/11/11 0543 09/11/11 0629 09/11/11 0730 09/11/11 0830  BP: 129/52 139/54 136/63 133/55  Pulse:      Temp: 100.1 F (37.8 C) 98.1 F (36.7 C) 98.4 F (36.9 C) 98.8 F (37.1 C)  TempSrc: Oral Oral Oral Oral  Resp: 11 16 19 16   Height:      Weight:      SpO2: 98%   98%   Weight change: -1 kg (-2 lb 3.3 oz)  Intake/Output Summary (Last 24 hours) at 09/11/11 1011 Last data filed at 09/11/11 0900  Gross per 24 hour  Intake  752.5 ml  Output   1065 ml  Net -312.5 ml   Labs: Basic Metabolic Panel:  Lab 09/11/11 1610 09/10/11 0730 09/10/11 0500 09/09/11 1504 09/05/11 0616  NA 135 136 137 138 136  K 4.3 3.9 4.0 3.8 3.6  CL 101 99 102 -- 100  CO2 27 24 25  -- 27  GLUCOSE 79 77 83 79 87  BUN 8 15 15  -- 14  CREATININE 3.41* 5.12* 4.91* -- 4.36*  ALB -- -- -- -- --  CALCIUM 8.5 7.9* 8.0* -- 7.9*  PHOS -- 4.8* -- -- 3.9   Liver Function Tests:  Lab 09/10/11 0730 09/05/11 0616  AST -- --  ALT -- --  ALKPHOS -- --  BILITOT -- --  PROT -- --  ALBUMIN 1.9* 1.5*   No results found for this basename: LIPASE:3,AMYLASE:3 in the last 168 hours No results found for this basename: AMMONIA:3 in the last 168 hours CBC:  Lab 09/11/11 0410 09/10/11 0730 09/10/11 0500 09/09/11 1504 09/09/11 0617  WBC 8.7 11.6* 13.3* -- 9.4  NEUTROABS -- -- -- -- --  HGB 6.3* 7.4* 7.3* 9.9* --  HCT 20.2* 24.0* 23.4* 29.0* --  MCV 91.4 90.9 89.3 -- 89.7  PLT 195 205 206 -- 250   Cardiac Enzymes: No results found for this basename: CKTOTAL:5,CKMB:5,CKMBINDEX:5,TROPONINI:5 in the last 168 hours CBG:  Lab 09/09/11 0446 09/09/11 0035  GLUCAP 83 89    Iron Studies: No results found for this basename: IRON:30,TIBC:30,SATURATION RATIOS:30,TRANSFERRIN:30,FERRITIN:30 in the last 168 hours  Physical Exam:  Blood pressure 133/55, pulse 100,  temperature 98.8 F (37.1 C), temperature source Oral, resp. rate 16, height 5\' 6"  (1.676 m), weight 71.5 kg (157 lb 10.1 oz), SpO2 98.00%.  RUE:[id number] in bed NWG:NFAOZ RRR, normal S1 and S2  Resp:CTA bilaterally, no rales/rhonchi  HYQ:MVHQ, flat, NT, BS normal  ION:GEXB LE forefoot ischemia noted- no LE edema  Impression/Plan   1. Sepsis Syndrome with C diff colitis/PNA: Improved - now on PO vanco only for C diff.  2. C Diff colitis:  on PO vancomycin  3. ESRD: continue MWF HD via PC, next treatment planned for tomorrow.  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  Phos is 4.8 5. HTN/volume- well controlled with HD 6.  Anemia- hgb low- on aranesp.  Needs transfusion.  I am OK with transfuse one unit today and I can give second with HD tomorrow 7.   Left foot ischemia: management by [facility]- s/p fem-pop with mid foot amp later anticipated.  Plan on doing HD first shift tomorrow unless that will interfere with procedure planned.  8. .  Dispo- search in process for SNF   Samyukta Cura A

## 2011-09-12 ENCOUNTER — Inpatient Hospital Stay (HOSPITAL_COMMUNITY): Payer: Medicare Other | Admitting: Anesthesiology

## 2011-09-12 ENCOUNTER — Encounter (HOSPITAL_COMMUNITY): Payer: Self-pay | Admitting: Anesthesiology

## 2011-09-12 ENCOUNTER — Inpatient Hospital Stay (HOSPITAL_COMMUNITY): Payer: Medicare Other

## 2011-09-12 ENCOUNTER — Encounter (HOSPITAL_COMMUNITY)
Admission: EM | Disposition: A | Discharge status: Other Acute Inpatient Hospital | Payer: Self-pay | Source: Home / Self Care | Attending: Internal Medicine

## 2011-09-12 DIAGNOSIS — N2581 Secondary hyperparathyroidism of renal origin: Secondary | ICD-10-CM

## 2011-09-12 DIAGNOSIS — N186 End stage renal disease: Secondary | ICD-10-CM

## 2011-09-12 DIAGNOSIS — Z48812 Encounter for surgical aftercare following surgery on the circulatory system: Secondary | ICD-10-CM

## 2011-09-12 DIAGNOSIS — A0472 Enterocolitis due to Clostridium difficile, not specified as recurrent: Secondary | ICD-10-CM

## 2011-09-12 DIAGNOSIS — I70269 Atherosclerosis of native arteries of extremities with gangrene, unspecified extremity: Secondary | ICD-10-CM

## 2011-09-12 HISTORY — PX: AMPUTATION: SHX166

## 2011-09-12 LAB — RENAL FUNCTION PANEL
Albumin: 1.6 g/dL — ABNORMAL LOW (ref 3.5–5.2)
Calcium: 8.6 mg/dL (ref 8.4–10.5)
GFR calc Af Amer: 15 mL/min — ABNORMAL LOW (ref >90)
GFR calc non Af Amer: 13 mL/min — ABNORMAL LOW (ref >90)
Phosphorus: 4.3 mg/dL (ref 2.3–4.6)
Sodium: 138 mEq/L (ref 135–145)

## 2011-09-12 LAB — PROTIME-INR
INR: 1.3 (ref 0.00–1.49)
Prothrombin Time: 16.4 seconds — ABNORMAL HIGH (ref 11.6–15.2)

## 2011-09-12 LAB — CBC
MCHC: 31.8 g/dL (ref 30.0–36.0)
RDW: 18.5 % — ABNORMAL HIGH (ref 11.5–15.5)

## 2011-09-12 SURGERY — AMPUTATION BELOW KNEE
Anesthesia: General | Site: Foot | Laterality: Left

## 2011-09-12 MED ORDER — ONDANSETRON HCL 4 MG/2ML IJ SOLN
INTRAMUSCULAR | Status: DC | PRN
  Administered 2011-09-12: 4 mg via INTRAVENOUS

## 2011-09-12 MED ORDER — BACITRACIN ZINC 500 UNIT/GM EX OINT
TOPICAL_OINTMENT | CUTANEOUS | Status: DC | PRN
  Administered 2011-09-12: 1 via TOPICAL

## 2011-09-12 MED ORDER — 0.9 % SODIUM CHLORIDE (POUR BTL) OPTIME
TOPICAL | Status: DC | PRN
  Administered 2011-09-12: 1000 mL

## 2011-09-12 MED ORDER — BACITRACIN ZINC 500 UNIT/GM EX OINT
TOPICAL_OINTMENT | CUTANEOUS | Status: AC
  Filled 2011-09-12: qty 15

## 2011-09-12 MED ORDER — FENTANYL CITRATE 0.05 MG/ML IJ SOLN
INTRAMUSCULAR | Status: DC | PRN
  Administered 2011-09-12 (×5): 50 ug via INTRAVENOUS

## 2011-09-12 MED ORDER — LIDOCAINE HCL (CARDIAC) 20 MG/ML IV SOLN
INTRAVENOUS | Status: DC | PRN
  Administered 2011-09-12: 70 mg via INTRAVENOUS

## 2011-09-12 MED ORDER — CEFAZOLIN SODIUM 1-5 GM-% IV SOLN
INTRAVENOUS | Status: DC | PRN
  Administered 2011-09-12: 2 g via INTRAVENOUS

## 2011-09-12 MED ORDER — SODIUM CHLORIDE 0.9 % IV SOLN
INTRAVENOUS | Status: DC | PRN
  Administered 2011-09-12: 14:00:00 via INTRAVENOUS

## 2011-09-12 MED ORDER — PROPOFOL 10 MG/ML IV EMUL
INTRAVENOUS | Status: DC | PRN
  Administered 2011-09-12: 160 mg via INTRAVENOUS

## 2011-09-12 MED ORDER — HYDROMORPHONE HCL PF 1 MG/ML IJ SOLN
0.2500 mg | INTRAMUSCULAR | Status: DC | PRN
  Administered 2011-09-12 (×2): 0.5 mg via INTRAVENOUS

## 2011-09-12 MED ORDER — CEFAZOLIN SODIUM 1-5 GM-% IV SOLN
INTRAVENOUS | Status: AC
  Filled 2011-09-12: qty 50

## 2011-09-12 MED ORDER — PHENYLEPHRINE HCL 10 MG/ML IJ SOLN
INTRAMUSCULAR | Status: DC | PRN
  Administered 2011-09-12 (×4): 80 ug via INTRAVENOUS

## 2011-09-12 MED ORDER — MIDAZOLAM HCL 5 MG/5ML IJ SOLN
INTRAMUSCULAR | Status: DC | PRN
  Administered 2011-09-12 (×2): 1 mg via INTRAVENOUS

## 2011-09-12 MED ORDER — HYDROMORPHONE HCL PF 1 MG/ML IJ SOLN
INTRAMUSCULAR | Status: AC
  Filled 2011-09-12: qty 1

## 2011-09-12 MED ORDER — MORPHINE SULFATE 4 MG/ML IJ SOLN
INTRAMUSCULAR | Status: AC
  Administered 2011-09-12: 3 mg via INTRAVENOUS
  Filled 2011-09-12: qty 1

## 2011-09-12 MED ORDER — ONDANSETRON HCL 4 MG/2ML IJ SOLN: 4.0000 mg | Freq: Four times a day (QID) | INTRAMUSCULAR | Status: DC | PRN

## 2011-09-12 SURGICAL SUPPLY — 53 items
BANDAGE ELASTIC 4 VELCRO ST LF (GAUZE/BANDAGES/DRESSINGS) ×2 IMPLANT
BANDAGE ELASTIC 6 VELCRO ST LF (GAUZE/BANDAGES/DRESSINGS) IMPLANT
BANDAGE ESMARK 6X9 LF (GAUZE/BANDAGES/DRESSINGS) IMPLANT
BANDAGE GAUZE ELAST BULKY 4 IN (GAUZE/BANDAGES/DRESSINGS) ×2 IMPLANT
BNDG COHESIVE 6X5 TAN STRL LF (GAUZE/BANDAGES/DRESSINGS) IMPLANT
BNDG ESMARK 6X9 LF (GAUZE/BANDAGES/DRESSINGS)
CANISTER SUCTION 2500CC (MISCELLANEOUS) ×2 IMPLANT
CLIP TI MEDIUM 6 (CLIP) IMPLANT
CLOTH BEACON ORANGE TIMEOUT ST (SAFETY) ×2 IMPLANT
COVER SURGICAL LIGHT HANDLE (MISCELLANEOUS) ×4 IMPLANT
CUFF TOURNIQUET SINGLE 34IN LL (TOURNIQUET CUFF) IMPLANT
CUFF TOURNIQUET SINGLE 44IN (TOURNIQUET CUFF) IMPLANT
DRAIN CHANNEL 19F RND (DRAIN) IMPLANT
DRAPE ORTHO SPLIT 77X108 STRL (DRAPES) ×2
DRAPE PROXIMA HALF (DRAPES) ×2 IMPLANT
DRAPE SURG ORHT 6 SPLT 77X108 (DRAPES) ×2 IMPLANT
DRSG ADAPTIC 3X8 NADH LF (GAUZE/BANDAGES/DRESSINGS) ×2 IMPLANT
ELECT REM PT RETURN 9FT ADLT (ELECTROSURGICAL) ×2
ELECTRODE REM PT RTRN 9FT ADLT (ELECTROSURGICAL) ×1 IMPLANT
EVACUATOR SILICONE 100CC (DRAIN) IMPLANT
GAUZE SPONGE 4X4 16PLY XRAY LF (GAUZE/BANDAGES/DRESSINGS) ×2 IMPLANT
GLOVE BIOGEL PI IND STRL 7.0 (GLOVE) ×1 IMPLANT
GLOVE BIOGEL PI IND STRL 7.5 (GLOVE) ×1 IMPLANT
GLOVE BIOGEL PI INDICATOR 7.0 (GLOVE) ×1
GLOVE BIOGEL PI INDICATOR 7.5 (GLOVE) ×1
GLOVE SURG SS PI 7.5 STRL IVOR (GLOVE) ×4 IMPLANT
GOWN PREVENTION PLUS XXLARGE (GOWN DISPOSABLE) ×2 IMPLANT
GOWN STRL NON-REIN LRG LVL3 (GOWN DISPOSABLE) ×4 IMPLANT
KIT BASIN OR (CUSTOM PROCEDURE TRAY) ×2 IMPLANT
KIT ROOM TURNOVER OR (KITS) ×2 IMPLANT
NS IRRIG 1000ML POUR BTL (IV SOLUTION) ×2 IMPLANT
PACK GENERAL/GYN (CUSTOM PROCEDURE TRAY) ×2 IMPLANT
PAD ARMBOARD 7.5X6 YLW CONV (MISCELLANEOUS) ×6 IMPLANT
PADDING CAST COTTON 6X4 STRL (CAST SUPPLIES) IMPLANT
SAW GIGLI STERILE 20 (MISCELLANEOUS) IMPLANT
SPONGE GAUZE 4X4 12PLY (GAUZE/BANDAGES/DRESSINGS) ×2 IMPLANT
STAPLER VISISTAT 35W (STAPLE) IMPLANT
STOCKINETTE IMPERVIOUS LG (DRAPES) ×2 IMPLANT
SUT BONE WAX W31G (SUTURE) IMPLANT
SUT ETHILON 2 0 FS 18 (SUTURE) ×6 IMPLANT
SUT ETHILON 3 0 PS 1 (SUTURE) IMPLANT
SUT SILK 0 TIES 10X30 (SUTURE) IMPLANT
SUT SILK 2 0 (SUTURE)
SUT SILK 2 0 SH CR/8 (SUTURE) IMPLANT
SUT SILK 2-0 18XBRD TIE 12 (SUTURE) IMPLANT
SUT SILK 3 0 (SUTURE)
SUT SILK 3-0 18XBRD TIE 12 (SUTURE) IMPLANT
SUT VIC AB 2-0 CT1 18 (SUTURE) ×2 IMPLANT
TAPE UMBILICAL COTTON 1/8X30 (MISCELLANEOUS) IMPLANT
TOWEL OR 17X24 6PK STRL BLUE (TOWEL DISPOSABLE) ×2 IMPLANT
TOWEL OR 17X26 10 PK STRL BLUE (TOWEL DISPOSABLE) ×2 IMPLANT
UNDERPAD 30X30 INCONTINENT (UNDERPADS AND DIAPERS) ×2 IMPLANT
WATER STERILE IRR 1000ML POUR (IV SOLUTION) ×2 IMPLANT

## 2011-09-12 NOTE — Progress Notes (Signed)
OT Cancellation Note  Treatment cancelled today due to:  Pt currently at HD and OT to continue to check back and follow acutely.    Harrel Carina [place]   OTR/L Pager: [phone] Office: [phone] .

## 2011-09-12 NOTE — Op Note (Signed)
[facility] [place]  Patient name: [name] MRN: [id number] DOB: Dec 18, 1950 Sex: male  08/26/2011 - 09/12/2011 Pre-operative Diagnosis: Left foot gangrene Post-operative diagnosis:  Same Surgeon:  Jorge Ny Assistants:  none Procedure:   Left trans-metatarsal amputation Anesthesia:  general Blood Loss:  See anesthesia record Specimens:  Left forefoot  Findings:  Good tissue at amputation site.  No residual infection  Indications:  The patient is recently s/p left leg bypass for an ischemic foot with gangrene of the left forefoot.  The tissue has clearly demarcated.  He comes in today for amputation.  Procedure:  The patient was identified in the holding area and taken to [facility] OR ROOM 16  The patient was then placed supine on the table. general anesthesia was administered.  The patient was prepped and draped in the usual sterile fashion.  A time out was called and antibiotics were administered.  A fishmouth incision was made on the forefoot so as to remove all non-viable tissue.  I extended the flap posteriorly.  Cautery was used to divide the tissue down to the bone on all five digits at the level of the metatarsal bone.  A periosteal elevator was used to elevate the periosteum.  An oscillating saw was used to divide all five metatarsal bones.  The forefoot was sent as a specimen.  The tissue had adequate perfusion.  The wound was irrigated and hemostasis was achieved.  The fascia was closed with interrupted 2-0 vicryl, and the skin was closed with 3-0 nylon.  Sterile dressings were applied.  There were no complications   Disposition:  To PACU in stable condition.   Juleen China, M.D. [facility] [place] Office: [phone] Pager:  [phone]

## 2011-09-12 NOTE — Progress Notes (Signed)
[facility] [place] TEAM 1 - Stepdown/ICU TEAM  Interim history: This is a 61 y/o male with peripheral vascular disease and ESRD who was recently hospitalized for severe sepsis due to c.diff.  He was readmitted through the [facility] ED on 08/26/11 with respiratory failure and sepsis. His family states that he had been doing well since his d/c home on 6/1, but developed constipation on 6/3. On 6/4 he developed nausea and vomiting in the evening followed by respiratory distress and confusion. He was intubated in the ED for respiratory failure with associated encephalopathy. He last went to HD on 6/3. Etiology for respiratory failure was felt likely related to aspiration versus healthcare acquired pneumonia by pulmonary critical care medicine. He was extubated on 08/29/2011. CT scan performed 08/27/2011 demonstrated a possible closed loop bowel obstruction. Patient was evaluated by surgery and found to have no indications for surgical intervention. The abnormal CT scan findings were likely related to severe ileus from recalcitrant C. difficile colitis. Patient is currently being treated with oral vancomycin with Flagyl and previously this admission received vancomycin enemas. Because of concerns of possible intra-abdominal sepsis and for treatment of healthcare acquired pneumonia patient was started on imipenem and intravenous vancomycin-both of which have been discontinued. Patient improved enough to transfer back to the general medical floor under the care of the hospitalist service. Once patient's initial encephalopathy improved he was complaining of left foot pain and was found to have black discoloration of his toes that was concerning for gangrene. Because of these findings vascular surgery was consulted. Workup did reveal ischemia of the left lower extremity and he subsequently underwent left common femoral artery endarterectomy with vein patch angioplasty and a left femoral to below-the-knee popliteal  artery bypass graft with saphenous vein on 09/09/2011. Patient had previously been scheduled for revascularization procedure prior to this admission. He was aware that he would possibly need transmetatarsal amputation of the left foot after successful revascularization. Because of vascular procedures performed patient was transferred to the step down unit immediately postoperative from the postanesthesia care unit.  Subjective: Evaluating during hemodialysis treatment. States feeling better every day especially after receiving blood yesterday and today. States toes on that foot are quite tender to the touch.  Objective: Blood pressure 155/73, pulse 74, temperature 97.3 F (36.3 C), temperature source Oral, resp. rate 20, height 5\' 6"  (1.676 m), weight 76.7 kg (169 lb 1.5 oz), SpO2 95.00%.  Intake/Output from previous day: 06/20 0701 - 06/21 0700 In: 820 [P.O.:480; I.V.:90; Blood:250] Out: 100 [Drains:100] Intake/Output this shift: Total I/O In: 550 [I.V.:200; Blood:350] Out: -   General appearance: cooperative, appears older than stated age and no distress Resp: clear to auscultation bilaterally Cardio: regular rate and sinus rhythm, S1, S2 normal, no murmur, click, rub or gallop GI: soft, non-tender; bowel sounds normal; no masses,  no organomegaly Extremities: no signif LE edema - dry gangrenous toes on L foot Neurologic: Grossly normal  Lab Results:  Basename 09/12/11 0410 09/11/11 1153  WBC 9.7 9.5  HGB 7.8* 7.6*  HCT 24.5* 24.1*  PLT 190 186   BMET  Basename 09/12/11 0410 09/11/11 0410  NA 138 135  K 4.4 4.3  CL 102 101  CO2 26 27  GLUCOSE 106* 79  BUN 13 8  CREATININE 4.57* 3.41*  CALCIUM 8.6 8.5   Medications: I have reviewed the patient's current medications.  Assessment/Plan:  Septic shock *Resolved *Multifactorial etiology related to healthcare acquired pneumonia as well as recalcitrant C. difficile colitis  C.  difficile colitis- recalcitrant *Continue  oral vancomycin *Avoid Nexium to avoid precipitating C. Difficile colitis *Minimize other antibiotic therapy as able  Acute respiratory failure with hypoxia/Hospital-acquired pneumonia *Briefly required intubation during the initial portion of hospitalization *Now tolerating nasal cannula oxygen  *Last chest x-ray 08/29/2011 demonstrated possible bilateral pneumonia so repeat chest x-ray 09/10/2011 shows no evidence of pneumonia *Currently on Zinacef but this appears to have been ordered not for pneumonia but for empiric postoperative coverage after vascular surgery  Peripheral vascular occlusive disease/Ischemia of left foot *Status post revascularization procedure 09/09/2011 *For transmetatarsal amputation on the left today 09/12/2011 *Management per vascular team  CKD (chronic kidney disease) stage V requiring chronic dialysis *Nephrology managing *Dialyzes on Monday Wednesday and Friday  Metabolic encephalopathy *Resolved and related to acute septic illness  Anemia of chronic renal failure - baseline hgb 9-10 *Receiving blood today during hemodialysis *Has received 5 units of packed red blood cells so far this this admission *Given history of coronary artery disease would like to keep hemoglobin greater than or equal to 8.0 *Continue Aranesp  Chronic diastolic CHF (congestive heart failure) *compensated *Keep blood pressure controled *Acute severe exacerbations best treated by urgent dialysis  Hypertension *Continue Norvasc and metoprolol *Blood pressure well controlled *Was on Prinivil as well at home and this is currently on hold since patient's post septic blood pressures are still somewhat low but are beginning to return to baseline  Atrial fibrillation *Maintaining sinus rhythm  CAD (coronary artery disease) *Continue aspirin, beta blocker and statin *Blood pressure just now rebounding but hopefully can resume Imdur soon  Disposition *Remain in step down    *Patient would like to continue rehabilitation at the [facility] skilled nursing facility   LOS: 17 days   Junious Silk, ANP pager [phone]  [facility] [email or web address] Password: [email or web address]  09/12/2011, 11:24 AM  I have examined the patient and reviewed the chart. I agree with the above note.   Calvert Cantor, MD [phone]

## 2011-09-12 NOTE — Transfer of Care (Signed)
Immediate Anesthesia Transfer of Care Note  Patient: [name]  Procedure(s) Performed: Procedure(s) (LRB): AMPUTATION BELOW KNEE (Left)  Patient Location: PACU  Anesthesia Type: General  Level of Consciousness: awake, patient cooperative and confused  Airway & Oxygen Therapy: Patient Spontanous Breathing and Patient connected to nasal cannula oxygen  Post-op Assessment: Report given to PACU RN and Post -op Vital signs reviewed and stable  Post vital signs: Reviewed and stable  Complications: No apparent anesthesia complications

## 2011-09-12 NOTE — Preoperative (Signed)
Beta Blockers   Reason not to administer Beta Blockers:Not Applicable 

## 2011-09-12 NOTE — Anesthesia Postprocedure Evaluation (Signed)
  Anesthesia Post-op Note  Patient: [name]  Procedure(s) Performed: Procedure(s) (LRB): AMPUTATION BELOW KNEE (Left)  Patient Location: PACU  Anesthesia Type: General  Level of Consciousness: awake, alert  and oriented  Airway and Oxygen Therapy: Patient Spontanous Breathing and Patient connected to nasal cannula oxygen  Post-op Pain: mild  Post-op Assessment: Post-op Vital signs reviewed  Post-op Vital Signs: Reviewed  Complications: No apparent anesthesia complications

## 2011-09-12 NOTE — Procedures (Signed)
Patient was seen on dialysis and the procedure was supervised.  BFR 400  Via PC BP is  161/58.   Patient appears to be tolerating treatment well  Makalia Bare A 09/12/2011

## 2011-09-12 NOTE — Interval H&P Note (Signed)
History and Physical Interval Note:  09/12/2011 3:13 PM  [name]  has presented today for surgery, with the diagnosis of PVD WITH GANGRENE  The various methods of treatment have been discussed with the patient and family. After consideration of risks, benefits and other options for treatment, the patient has consented to  Procedure(s) (LRB): Trans-metatarsal amputation (Left) as a surgical intervention .  The patient's history has been reviewed, patient examined, no change in status, stable for surgery.  I have reviewed the patients' chart and labs.  Questions were answered to the patient's satisfaction.     Sommer Spickard IV, V. WELLS

## 2011-09-12 NOTE — Progress Notes (Signed)
Clinical Social Work  CSW reviewed chart which stated patient is not ready to dc. CSW called SNF [facility]) and updated facility on patient's dc plans. CSW will continue to follow.  [place], [place] [phone]

## 2011-09-12 NOTE — Anesthesia Procedure Notes (Signed)
Procedure Name: LMA Insertion Date/Time: 09/12/2011 3:07 PM Performed by: Leona Singleton A Pre-anesthesia Checklist: Patient identified Patient Re-evaluated:Patient Re-evaluated prior to inductionOxygen Delivery Method: Circle system utilized Preoxygenation: Pre-oxygenation with 100% oxygen Intubation Type: IV induction LMA: LMA with gastric port inserted LMA Size: 4.0 Tube type: Oral Number of attempts: 1 Placement Confirmation: positive ETCO2 and breath sounds checked- equal and bilateral Tube secured with: Tape Dental Injury: Teeth and Oropharynx as per pre-operative assessment

## 2011-09-12 NOTE — Anesthesia Preprocedure Evaluation (Signed)
Anesthesia Evaluation  Patient identified by MRN, date of birth, ID band Patient awake    Reviewed: Allergy & Precautions, H&P , NPO status , Patient's Chart, lab work & pertinent test results  Airway Mallampati: II  Neck ROM: full    Dental   Pulmonary shortness of breath, former smoker         Cardiovascular hypertension, + CAD and +CHF + dysrhythmias     Neuro/Psych CVA    GI/Hepatic   Endo/Other    Renal/GU ESRF and DialysisRenal disease     Musculoskeletal   Abdominal   Peds  Hematology   Anesthesia Other Findings   Reproductive/Obstetrics                           Anesthesia Physical Anesthesia Plan  ASA: III  Anesthesia Plan: General   Post-op Pain Management:    Induction: Intravenous  Airway Management Planned: LMA  Additional Equipment:   Intra-op Plan:   Post-operative Plan:   Informed Consent: I have reviewed the patients History and Physical, chart, labs and discussed the procedure including the risks, benefits and alternatives for the proposed anesthesia with the patient or authorized representative who has indicated his/her understanding and acceptance.     Plan Discussed with: CRNA and Surgeon  Anesthesia Plan Comments:         Anesthesia Quick Evaluation

## 2011-09-12 NOTE — Progress Notes (Signed)
Hydrotherapy Cancellation Note  Pt at HD this AM and now down for surgery.  Nursing has changed sacral dressing.  Will resume hydrotherapy tomorrow.  [employer] PT [phone]

## 2011-09-12 NOTE — Progress Notes (Signed)
Subjective:  Seen on HD.  No particular complaints.  Due for midfoot amp today after HD  Objective:    Vital signs in last 24 hours: Filed Vitals:   09/12/11 0905 09/12/11 0937 09/12/11 1001 09/12/11 1030  BP: 169/78 165/77 149/77 158/74  Pulse: 82 74 70 74  Temp: 97.3 F (36.3 C)     TempSrc: Oral     Resp: 15 17 13 13   Height:      Weight:      SpO2:       Weight change: 6.2 kg (13 lb 10.7 oz)  Intake/Output Summary (Last 24 hours) at 09/12/11 1050 Last data filed at 09/12/11 0905  Gross per 24 hour  Intake    800 ml  Output    100 ml  Net    700 ml   Labs: Basic Metabolic Panel:  Lab 09/12/11 1610 09/11/11 0410 09/10/11 0730 09/10/11 0500 09/09/11 1504  NA 138 135 136 137 138  K 4.4 4.3 3.9 4.0 3.8  CL 102 101 99 102 --  CO2 26 27 24 25  --  GLUCOSE 106* 79 77 83 79  BUN 13 8 15 15  --  CREATININE 4.57* 3.41* 5.12* 4.91* --  ALB -- -- -- -- --  CALCIUM 8.6 8.5 7.9* 8.0* --  PHOS 4.3 -- 4.8* -- --   Liver Function Tests:  Lab 09/12/11 0410 09/10/11 0730  AST -- --  ALT -- --  ALKPHOS -- --  BILITOT -- --  PROT -- --  ALBUMIN 1.6* 1.9*   No results found for this basename: LIPASE:3,AMYLASE:3 in the last 168 hours No results found for this basename: AMMONIA:3 in the last 168 hours CBC:  Lab 09/12/11 0410 09/11/11 1153 09/11/11 0410 09/10/11 0730  WBC 9.7 9.5 8.7 11.6*  NEUTROABS -- -- -- --  HGB 7.8* 7.6* 6.3* 7.4*  HCT 24.5* 24.1* 20.2* 24.0*  MCV 88.8 88.3 91.4 90.9  PLT 190 186 195 205   Cardiac Enzymes: No results found for this basename: CKTOTAL:5,CKMB:5,CKMBINDEX:5,TROPONINI:5 in the last 168 hours CBG:  Lab 09/09/11 0446 09/09/11 0035  GLUCAP 83 89    Iron Studies: No results found for this basename: IRON:30,TIBC:30,SATURATION RATIOS:30,TRANSFERRIN:30,FERRITIN:30 in the last 168 hours  Physical Exam:  Blood pressure 158/74, pulse 74, temperature 97.3 F (36.3 C), temperature source Oral, resp. rate 13, height 5\' 6"  (1.676 m), weight 76.7  kg (169 lb 1.5 oz), SpO2 95.00%.  RUE:AVWUJWJXBJY in bed NWG:NFAOZ RRR, normal S1 and S2  Resp:CTA bilaterally, no rales/rhonchi  HYQ:MVHQ, flat, NT, BS normal  ION:GEXB LE forefoot ischemia noted- no LE edema  Impression/Plan   1. Sepsis Syndrome with C diff colitis/PNA: Improved - now on PO vanco only for C diff.  2. C Diff colitis:  on PO vancomycin  3. ESRD: continue MWF HD via PC.  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  Phos is 4.3 5. HTN/volume-  controlled with HD 6.  Anemia- hgb low- on aranesp.  Got total of 2 units yesterday and today.  7.   Left foot ischemia: management by [facility]- s/p fem-pop with mid foot amp today.  Continue HD MWF. 8. .  Dispo- search in process for SNF, ?LTAC   Jani Ploeger A

## 2011-09-12 NOTE — Progress Notes (Addendum)
VASCULAR LAB PRELIMINARY  ARTERIAL  ABI completed:    RIGHT    LEFT    PRESSURE WAVEFORM  PRESSURE WAVEFORM  BRACHIAL  Biphasic-HD access BRACHIAL 150 Biphasic  DP  Dampened monophasic DP  Dampened monophasic  AT   AT    PT  Dampened monophasic   Monophasic  PER   PER    GREAT TOE  NA GREAT TOE  NA    RIGHT LEFT  ABI     Unable to obtain right brachial pressure due to HD access. Unable to obtain bilateral lower extremity pressures due to noncompressible vessels, therefore ABIs were unobtainable bilaterally. Refer to table above for waveform characteristics.  Malachy Moan, RDMS, RDCS 09/12/2011, 1:34 PM

## 2011-09-12 NOTE — H&P (View-Only) (Signed)
[facility]  Progress Note Bypass Surgery  Date of Surgery: 08/26/2011 - 09/09/2011  Procedure(s): Left BYPASS GRAFT FEMORAL-POPLITEAL ARTERY Surgeon: Surgeon(s): Maryjean Corpening W Jamarques Pinedo, MD  2 Days Post-Op  History of Present Illness  [name] is a 60 y.o. male who is S/P Procedure(s):left BYPASS GRAFT FEMORAL-POPLITEAL ARTERY  The patient's pre-op symptoms of pain in foot are Improved . Patients pain is well controlled.    VASC. LAB Studies:        ABI: pending   Significant Diagnostic Studies: CBC Lab Results  Component Value Date   WBC 8.7 09/11/2011   HGB 6.3* 09/11/2011   HCT 20.2* 09/11/2011   MCV 91.4 09/11/2011   PLT 195 09/11/2011    BMET     Component Value Date/Time   NA 135 09/11/2011 0410   K 4.3 09/11/2011 0410   CL 101 09/11/2011 0410   CO2 27 09/11/2011 0410   GLUCOSE 79 09/11/2011 0410   BUN 8 09/11/2011 0410   CREATININE 3.41* 09/11/2011 0410   CALCIUM 8.5 09/11/2011 0410   GFRNONAA 18* 09/11/2011 0410   GFRAA 21* 09/11/2011 0410    COAG Lab Results  Component Value Date   INR 1.38 08/27/2011   INR 1.54* 08/19/2011   INR 1.62* 08/13/2011   No results found for this basename: PTT    Physical Examination  BP Readings from Last 3 Encounters:  09/11/11 136/63  09/11/11 136/63  08/23/11 122/68   Temp Readings from Last 3 Encounters:  09/11/11 98.4 F (36.9 C) Oral  09/11/11 98.4 F (36.9 C) Oral  08/23/11 98.1 F (36.7 C) Oral   SpO2 Readings from Last 3 Encounters:  09/11/11 98%  09/11/11 98%  08/23/11 92%   Pulse Readings from Last 3 Encounters:  09/10/11 100  09/10/11 100  08/23/11 103    Pt is A&O x 3 left lower extremity: Incision/s is/are clean,dry.intact, and  healing without hematoma, erythema or drainage Limb is warm; with good color Decreased sensation top of left foot Toes 2-4 demarcating on left Right great toe with some demarcating - good sensation  Right Dorsalis Pedis pulse is monophasic by  Doppler RightPosterior tibial pulse is  weak and monophasic by Doppler  Left Dorsalis Pedis pulse is monophasic by Doppler LeftPosterior tibial pulse is  absent   Assessment/Plan: Pt. Doing well Post-op pain is controlled Wounds are healing well PT/OT for ambulation Continue wound care as ordered JP drain with 50cc overnight Hold on DVT prophylaxis sec to acute blood loss anemia and elevated PT/PTT Acute on chronic anemia - 2 UPC today Repeat labs in am Possible TMA tomorrow  ROCZNIAK,REGINA [phone] 09/11/2011 7:39 AM   Agree with above For Left TMA tomorrow  Wells Libbey Duce 

## 2011-09-12 NOTE — Interval H&P Note (Signed)
History and Physical Interval Note:  09/12/2011 2:11 PM  [name]  has presented today for surgery, with the diagnosis of PVD WITH GANGRENE  The various methods of treatment have been discussed with the patient and family. After consideration of risks, benefits and other options for treatment, the patient has consented to  Procedure(s) (LRB): AMPUTATION BELOW KNEE (Left) as a surgical intervention .  The patient's history has been reviewed, patient examined, no change in status, stable for surgery.  I have reviewed the patients' chart and labs.  Questions were answered to the patient's satisfaction.     [name] IV, V. WELLS

## 2011-09-13 DIAGNOSIS — N2581 Secondary hyperparathyroidism of renal origin: Secondary | ICD-10-CM

## 2011-09-13 DIAGNOSIS — I70269 Atherosclerosis of native arteries of extremities with gangrene, unspecified extremity: Secondary | ICD-10-CM

## 2011-09-13 DIAGNOSIS — A0472 Enterocolitis due to Clostridium difficile, not specified as recurrent: Secondary | ICD-10-CM

## 2011-09-13 DIAGNOSIS — N186 End stage renal disease: Secondary | ICD-10-CM

## 2011-09-13 LAB — TYPE AND SCREEN
ABO/RH(D): O POS
Antibody Screen: NEGATIVE
Unit division: 0

## 2011-09-13 MED ORDER — ISOSORBIDE MONONITRATE ER 30 MG PO TB24
30.0000 mg | ORAL_TABLET | Freq: Every day | ORAL | Status: DC
  Administered 2011-09-13 – 2011-09-16 (×4): 30 mg via ORAL
  Filled 2011-09-13 (×4): qty 1

## 2011-09-13 MED ORDER — HYDROMORPHONE HCL PF 1 MG/ML IJ SOLN
1.0000 mg | INTRAMUSCULAR | Status: DC | PRN
  Administered 2011-09-13 – 2011-09-15 (×2): 2 mg via INTRAVENOUS
  Filled 2011-09-13 (×2): qty 2

## 2011-09-13 MED ORDER — BISACODYL 5 MG PO TBEC: 5.0000 mg | DELAYED_RELEASE_TABLET | Freq: Every day | ORAL | Status: DC | PRN

## 2011-09-13 MED ORDER — OXYCODONE-ACETAMINOPHEN 5-325 MG PO TABS
1.0000 | ORAL_TABLET | ORAL | Status: AC | PRN
  Administered 2011-09-13 – 2011-09-15 (×11): 2 via ORAL
  Filled 2011-09-13 (×11): qty 2

## 2011-09-13 MED ORDER — MORPHINE SULFATE 4 MG/ML IJ SOLN: 4.0000 mg | INTRAMUSCULAR | Status: DC | PRN

## 2011-09-13 MED ORDER — HEPARIN SODIUM (PORCINE) 1000 UNIT/ML DIALYSIS
2000.0000 [IU] | INTRAMUSCULAR | Status: DC | PRN
  Filled 2011-09-13: qty 2

## 2011-09-13 MED ORDER — HYDROMORPHONE HCL PF 1 MG/ML IJ SOLN
1.0000 mg | INTRAMUSCULAR | Status: DC | PRN
  Administered 2011-09-13 (×3): 1 mg via INTRAVENOUS
  Filled 2011-09-13 (×3): qty 1

## 2011-09-13 NOTE — Progress Notes (Deleted)
Subjective:  Had left foot TMA yesterday, +postop pain, no other complaints. In good spirits.   Objective:    Vital signs in last 24 hours: Filed Vitals:   09/13/11 0035 09/13/11 0300 09/13/11 0435 09/13/11 0757  BP: 127/57 148/66 139/62 143/79  Pulse: 85 86 89 115  Temp: 98.6 F (37 C)  99.1 F (37.3 C)   TempSrc: Oral  Oral   Resp: 17 14 21 16   Height:      Weight:   69.1 kg (152 lb 5.4 oz)   SpO2: 97% 100% 98% 100%   Weight change: -3.4 kg (-7 lb 7.9 oz)  Intake/Output Summary (Last 24 hours) at 09/13/11 0932 Last data filed at 09/13/11 0400  Gross per 24 hour  Intake    640 ml  Output   3408 ml  Net  -2768 ml   Labs: Basic Metabolic Panel:  Lab 09/12/11 1610 09/11/11 0410 09/10/11 0730 09/10/11 0500 09/09/11 1504  NA 138 135 136 137 138  K 4.4 4.3 3.9 4.0 3.8  CL 102 101 99 102 --  CO2 26 27 24 25  --  GLUCOSE 106* 79 77 83 79  BUN 13 8 15 15  --  CREATININE 4.57* 3.41* 5.12* 4.91* --  ALB -- -- -- -- --  CALCIUM 8.6 8.5 7.9* 8.0* --  PHOS 4.3 -- 4.8* -- --   Liver Function Tests:  Lab 09/12/11 0410 09/10/11 0730  AST -- --  ALT -- --  ALKPHOS -- --  BILITOT -- --  PROT -- --  ALBUMIN 1.6* 1.9*   No results found for this basename: LIPASE:3,AMYLASE:3 in the last 168 hours No results found for this basename: AMMONIA:3 in the last 168 hours CBC:  Lab 09/12/11 0410 09/11/11 1153 09/11/11 0410 09/10/11 0730  WBC 9.7 9.5 8.7 11.6*  NEUTROABS -- -- -- --  HGB 7.8* 7.6* 6.3* 7.4*  HCT 24.5* 24.1* 20.2* 24.0*  MCV 88.8 88.3 91.4 90.9  PLT 190 186 195 205   Cardiac Enzymes: No results found for this basename: CKTOTAL:5,CKMB:5,CKMBINDEX:5,TROPONINI:5 in the last 168 hours CBG:  Lab 09/09/11 0446 09/09/11 0035  GLUCAP 83 89    Iron Studies: No results found for this basename: IRON:30,TIBC:30,SATURATION RATIOS:30,TRANSFERRIN:30,FERRITIN:30 in the last 168 hours  Physical Exam:  Blood pressure 143/79, pulse 115, temperature 99.1 F (37.3 C), temperature  source Oral, resp. rate 16, height 5\' 6"  (1.676 m), weight 69.1 kg (152 lb 5.4 oz), SpO2 100.00%.  RUE:AVWUJWJXBJY in bed NWG:NFAOZ RRR, normal S1 and S2  Resp:CTA bilaterally, no rales/rhonchi  HYQ:MVHQ, flat, NT, BS normal  ION:GEXB LE forefoot ischemia noted- no LE edema  Impression/Plan   1. Sepsis Syndrome with C diff colitis/PNA: Improved - now on PO vanco only for C diff.  2. C Diff colitis:  on PO vancomycin  3. ESRD: continue MWF HD via [facility].  Regular unit is [facility]. Has L IJ cath for access. HD orders written for Monday. 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  Phos is 4.3 5. HTN/volume-  controlled with HD 6.  Anemia- hgb low- on aranesp.  Got total of 2 units yesterday and today.  7.   Left foot ischemia: management by [facility]- s/p fem-pop, now with L TMA done 6/22 POD #1. 8. .  Dispo- search in process for SNF, ?LTAC   Doss Cybulski D

## 2011-09-13 NOTE — Evaluation (Signed)
Physical Therapy Re-Evaluation Patient Details Name: [name] MRN: [id number] DOB: 31-Jan-1951 Today's Date: 09/13/2011 Time: 1011-1026 PT Time Calculation (min): 15 min  PT Assessment / Plan / Recommendation Clinical Impression  Patient is now s/p transmetatarsal amputation left foot on 09/12/11.  Patient is now NWB LLE.  Was able to transfer bed to chair today.  Recommendations and goals remain appropriate for patient.  Will continue to provide PT services acutely to address functional mobility and patient education.    PT Assessment  Patient needs continued PT services    Follow Up Recommendations  Skilled nursing facility    Barriers to Discharge Decreased caregiver support      lEquipment Recommendations  Defer to next venue    Recommendations for Other Services     Frequency Min 2X/week    Precautions / Restrictions Precautions Precautions: Fall Precaution Comments: wound vac, jp drain, iv in Rt thigh Restrictions Weight Bearing Restrictions: Yes LLE Weight Bearing: Non weight bearing   Pertinent Vitals/Pain Pain limiting factor with mobility.      Mobility  Bed Mobility Bed Mobility: Rolling Right;Right Sidelying to Sit Rolling Right: 3: Mod assist Right Sidelying to Sit: 3: Mod assist;With rails;HOB flat Details for Bed Mobility Assistance: Verbal and tactile cues for technique for transitions. Transfers Transfers: Sit to Stand;Stand to Sit;Stand Pivot Transfers Sit to Stand: 1: +2 Total assist;With upper extremity assist;From bed Stand to Sit: 1: +2 Total assist;With upper extremity assist;With armrests;To chair/3-in-1 Stand Pivot Transfers: 1: +2 Total assist;With armrests Details for Transfer Assistance: Patient attempted standing x2 from bed.  On second attempt, able to hop on RLE to transfer to recliner with RW, NWB on LLE. Ambulation/Gait Ambulation/Gait Assistance: Not tested (comment)    Exercises     PT Diagnosis: Difficulty  walking;Generalized weakness;Acute pain  PT Problem List: Decreased strength;Decreased activity tolerance;Decreased balance;Decreased mobility;Decreased cognition;Decreased knowledge of use of DME;Decreased knowledge of precautions;Pain PT Treatment Interventions: DME instruction;Gait training;Functional mobility training;Therapeutic activities;Therapeutic exercise;Balance training;Cognitive remediation;Patient/family education   PT Goals Acute Rehab PT Goals PT Goal Formulation: With patient Time For Goal Achievement: 09/27/11 Potential to Achieve Goals: Good Pt will go Supine/Side to Sit: with supervision PT Goal: Supine/Side to Sit - Progress: Goal set today Pt will go Sit to Supine/Side: with supervision PT Goal: Sit to Supine/Side - Progress: Goal set today Pt will go Sit to Stand: with min assist;with upper extremity assist PT Goal: Sit to Stand - Progress: Goal set today Pt will Transfer Bed to Chair/Chair to Bed: with min assist PT Transfer Goal: Bed to Chair/Chair to Bed - Progress: Goal set today Pt will Ambulate: 1 - 15 feet;with min assist;with rolling walker PT Goal: Ambulate - Progress: Goal set today  Visit Information  Last PT Received On: 09/13/11 Assistance Needed: +2    Subjective Data  Subjective: Patient c/o pain from left foot amputation. Patient Stated Goal: To decrease pain.  To return home   Prior Functioning  Home Living Lives With: Alone Available Help at Discharge: Friend(s);Family;Available PRN/intermittently Type of Home: Apartment Home Access: Level entry Home Layout: One level Bathroom Shower/Tub: Engineer, manufacturing systems: Standard Home Adaptive Equipment: Shower chair with back;Bedside commode/3-in-1;Straight cane;Hand-held shower hose Prior Function Level of Independence: Independent Able to Take Stairs?: Yes Driving: No Vocation: On disability Communication Communication: No difficulties Dominant Hand: Right    Cognition   Overall Cognitive Status: Impaired Area of Impairment: Safety/judgement Arousal/Alertness: Awake/alert Orientation Level: Appears intact for tasks assessed Behavior During Session: [facility] for tasks performed  Following Commands: Follows one step commands consistently Safety/Judgement: Decreased awareness of safety precautions    Extremity/Trunk Assessment Right Upper Extremity Assessment RUE ROM/Strength/Tone: WFL for tasks assessed RUE ROM/Strength/Tone Deficits: General weakness - grossly 3+/5 Left Upper Extremity Assessment LUE ROM/Strength/Tone: WFL for tasks assessed Right Lower Extremity Assessment RLE ROM/Strength/Tone: Deficits RLE ROM/Strength/Tone Deficits: Weakness - grossly 4-/5 Left Lower Extremity Assessment LLE ROM/Strength/Tone: Deficits;Unable to fully assess;Due to pain LLE ROM/Strength/Tone Deficits: Weakness - grossly 3+/5; Painful foot from surgery   Balance    End of Session PT - End of Session Equipment Utilized During Treatment: Gait belt Activity Tolerance: Patient limited by fatigue;Patient limited by pain Patient left: in chair;with call bell/phone within reach Nurse Communication: Mobility status;Weight bearing status   Vena Austria 09/13/2011, 12:57 PM Durenda Hurt. Renaldo Fiddler, [facility] Acute Rehab Services Pager [phone]

## 2011-09-13 NOTE — Progress Notes (Signed)
Subjective:  S/p mid foot amputation yesterday.   No particular complaints.  Looks brighter than has looked.   Objective:    Vital signs in last 24 hours: Filed Vitals:   09/13/11 0035 09/13/11 0300 09/13/11 0435 09/13/11 0757  BP: 127/57 148/66 139/62 143/79  Pulse: 85 86 89 115  Temp: 98.6 F (37 C)  99.1 F (37.3 C)   TempSrc: Oral  Oral   Resp: 17 14 21 16   Height:      Weight:   69.1 kg (152 lb 5.4 oz)   SpO2: 97% 100% 98% 100%   Weight change: -3.4 kg (-7 lb 7.9 oz)  Intake/Output Summary (Last 24 hours) at 09/13/11 0934 Last data filed at 09/13/11 0400  Gross per 24 hour  Intake    640 ml  Output   3408 ml  Net  -2768 ml   Labs: Basic Metabolic Panel:  Lab 09/12/11 8295 09/11/11 0410 09/10/11 0730 09/10/11 0500 09/09/11 1504  NA 138 135 136 137 138  K 4.4 4.3 3.9 4.0 3.8  CL 102 101 99 102 --  CO2 26 27 24 25  --  GLUCOSE 106* 79 77 83 79  BUN 13 8 15 15  --  CREATININE 4.57* 3.41* 5.12* 4.91* --  ALB -- -- -- -- --  CALCIUM 8.6 8.5 7.9* 8.0* --  PHOS 4.3 -- 4.8* -- --   Liver Function Tests:  Lab 09/12/11 0410 09/10/11 0730  AST -- --  ALT -- --  ALKPHOS -- --  BILITOT -- --  PROT -- --  ALBUMIN 1.6* 1.9*   No results found for this basename: LIPASE:3,AMYLASE:3 in the last 168 hours No results found for this basename: AMMONIA:3 in the last 168 hours CBC:  Lab 09/12/11 0410 09/11/11 1153 09/11/11 0410 09/10/11 0730  WBC 9.7 9.5 8.7 11.6*  NEUTROABS -- -- -- --  HGB 7.8* 7.6* 6.3* 7.4*  HCT 24.5* 24.1* 20.2* 24.0*  MCV 88.8 88.3 91.4 90.9  PLT 190 186 195 205   Cardiac Enzymes: No results found for this basename: CKTOTAL:5,CKMB:5,CKMBINDEX:5,TROPONINI:5 in the last 168 hours CBG:  Lab 09/09/11 0446 09/09/11 0035  GLUCAP 83 89    Iron Studies: No results found for this basename: IRON:30,TIBC:30,SATURATION RATIOS:30,TRANSFERRIN:30,FERRITIN:30 in the last 168 hours  Physical Exam:  Blood pressure 143/79, pulse 115, temperature 99.1 F (37.3  C), temperature source Oral, resp. rate 16, height 5\' 6"  (1.676 m), weight 69.1 kg (152 lb 5.4 oz), SpO2 100.00%.  AOZ:HYQMVHQIONG in bed EXB:MWUXL RRR, normal S1 and S2  Resp:CTA bilaterally, no rales/rhonchi  KGM:WNUU, flat, NT, BS normal  Ext:s/p mid foot amp  no LE edema  Impression/Plan   1. Sepsis Syndrome with C diff colitis/PNA: Improved - now on PO vanco only for C diff.  2. PVD-  S/p fempop and mid foot amputation yesterday.  Per [facility] 3. ESRD: continue MWF HD via [facility].  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  Phos is 4.3 5. HTN/volume-  controlled with HD and meds 6.  Anemia- hgb low- on aranesp.  Got total of 2 units 6/20 and 6/21.  7.   Dispo- search in process for SNF, ?LTAC.  Could maybe take out femoral triple lumen cath, no IV meds that I can see other than PRN dilauded.    Ellese Julius A

## 2011-09-13 NOTE — Progress Notes (Addendum)
Physical Therapy Wound Treatment Patient Details  Name: [name] MRN: [id number] Date of Birth: 11-29-1950  Today's Date: 09/13/2011 Time:9:53  - 10:11    Subjective  Subjective: It's (sacrum) not hurting today.  Pain Score: Pain Score:   9  Wound Assessment  Pressure Ulcer 08/27/11 Stage II -  Partial thickness loss of dermis presenting as a shallow open ulcer with a red, pink wound bed without slough. (Active)  State of Healing Early/partial granulation 09/13/2011 12:14 PM  Site / Wound Assessment Black;Pink;Yellow 09/13/2011 12:14 PM  % Wound base Red or Granulating 30% 09/13/2011 12:14 PM  % Wound base Yellow 60% 09/13/2011 12:14 PM  % Wound base Black 10% 09/13/2011 12:14 PM  % Wound base Other (Comment) 0% 09/13/2011 12:14 PM  Drainage Amount Moderate 09/13/2011 12:14 PM  Drainage Description Purulent 09/13/2011 12:14 PM  Treatment Hydrotherapy (Pulse lavage) 09/13/2011 12:14 PM  Dressing Type ABD;Gauze (Comment);Tape dressing 09/13/2011 12:14 PM  Dressing Changed 09/13/2011 12:14 PM         Hydrotherapy Pulsed lavage therapy - wound location: Sacrum Pulsed Lavage with Suction (psi): 8 psi Pulsed Lavage with Suction - Normal Saline Used: 1000 mL Pulsed Lavage Tip: Tip with splash shield   Wound Assessment and Plan  Wound Therapy - Assess/Plan/Recommendations Wound Therapy - Clinical Statement: Slow improvement continues. Wound Therapy - Functional Problem List: Decreased mobility Factors Delaying/Impairing Wound Healing: Immobility;Multiple medical problems;Polypharmacy Hydrotherapy Plan: Debridement;Dressing change;Patient/family education;Pulsatile lavage with suction Wound Therapy - Frequency: 6X / week Wound Therapy - Follow Up Recommendations: Home health RN  Wound Therapy Goals- Improve the function of patient's integumentary system by progressing the wound(s) through the phases of wound healing (inflammation - proliferation - remodeling) by: Decrease Necrotic  Tissue - Progress: Progressing toward goal Increase Granulation Tissue - Progress: Progressing toward goal  Goals will be updated until maximal potential achieved or discharge criteria met.  Discharge criteria: when goals achieved, discharge from hospital, MD decision/surgical intervention, no progress towards goals, refusal/missing three consecutive treatments without notification or medical reason.  Vena Austria 09/13/2011, 12:24 PM Durenda Hurt. Renaldo Fiddler, [facility] Acute Rehab Services Pager [phone]

## 2011-09-13 NOTE — Progress Notes (Addendum)
[facility] OF [place]  Progress Note Bypass Surgery  Date of Surgery: 08/26/2011 - 09/12/2011  Procedure(s): AMPUTATION BELOW KNEE Surgeon: Surgeon(s): [name], MD  1 Day Post-Op  History of Present Illness  [name] is a 61 y.o. male who is S/P Procedure(s): AMPUTATION  left transmetatarsal .  The patient's pre-op symptoms of pain are Improved . Patients pain is well controlled.  S/P Fem-pop by pass with wound vac at left groin incision site.      Imaging: No results found.  Significant Diagnostic Studies: CBC Lab Results  Component Value Date   WBC 9.7 09/12/2011   HGB 7.8* 09/12/2011   HCT 24.5* 09/12/2011   MCV 88.8 09/12/2011   PLT 190 09/12/2011    BMET     Component Value Date/Time   NA 138 09/12/2011 0410   K 4.4 09/12/2011 0410   CL 102 09/12/2011 0410   CO2 26 09/12/2011 0410   GLUCOSE 106* 09/12/2011 0410   BUN 13 09/12/2011 0410   CREATININE 4.57* 09/12/2011 0410   CALCIUM 8.6 09/12/2011 0410   GFRNONAA 13* 09/12/2011 0410   GFRAA 15* 09/12/2011 0410    COAG Lab Results  Component Value Date   INR 1.30 09/12/2011   INR 1.38 08/27/2011   INR 1.54* 08/19/2011   No results found for this basename: PTT    Physical Examination  BP Readings from Last 3 Encounters:  09/13/11 139/62  09/13/11 139/62  09/13/11 139/62   Temp Readings from Last 3 Encounters:  09/13/11 99.1 F (37.3 C) Oral  09/13/11 99.1 F (37.3 C) Oral  09/13/11 99.1 F (37.3 C) Oral   SpO2 Readings from Last 3 Encounters:  09/13/11 98%  09/13/11 98%  09/13/11 98%   Pulse Readings from Last 3 Encounters:  09/13/11 89  09/13/11 89  09/13/11 89    Pt is A&O x 3 left lower extremity: Incision/s is/are clean,dry.intact, and  clean, dry, intact without hematoma, erythema or drainage Limb is warm; with good color Left peroneal doppler mono phasic  Left fore foot dressing intact clean and dry.   Assessment/Plan: Pt. Doing well Post-op pain is  controlled Wounds are clean, dry, intact No out put per wound vac in past shift since surgery. PT/OT for ambulation Continue wound care as ordered Transfer to 2000  Clinton Gallant [facility] [phone] 09/13/2011 7:42 AM   Agree with above  Will look at wound in am   Durene Cal

## 2011-09-13 NOTE — Progress Notes (Signed)
[facility] [place] TEAM 1 - Stepdown/ICU TEAM  Interim history: This is a 61 y/o male with peripheral vascular disease and ESRD who was recently hospitalized for severe sepsis due to c.diff.  He was readmitted through the [facility] ED on 08/26/11 with respiratory failure and sepsis. His family states that he had been doing well since his d/c home on 6/1, but developed constipation on 6/3. On 6/4 he developed nausea and vomiting in the evening followed by respiratory distress and confusion. He was intubated in the ED for respiratory failure with associated encephalopathy. He last went to HD on 6/3. Etiology for respiratory failure was felt likely related to aspiration versus healthcare acquired pneumonia by pulmonary critical care medicine. He was extubated on 08/29/2011. CT scan performed 08/27/2011 demonstrated a possible closed loop bowel obstruction. Patient was evaluated by surgery and found to have no indications for surgical intervention. The abnormal CT scan findings were likely related to severe ileus from recalcitrant C. difficile colitis. Patient is currently being treated with oral vancomycin with Flagyl and previously this admission received vancomycin enemas. Because of concerns of possible intra-abdominal sepsis and for treatment of healthcare acquired pneumonia patient was started on imipenem and intravenous vancomycin-both of which have been discontinued. Patient improved enough to transfer back to the general medical floor under the care of the hospitalist service. Once patient's initial encephalopathy improved he was complaining of left foot pain and was found to have black discoloration of his toes that was concerning for gangrene. Because of these findings vascular surgery was consulted. Workup did reveal ischemia of the left lower extremity and he subsequently underwent left common femoral artery endarterectomy with vein patch angioplasty and a left femoral to below-the-knee popliteal  artery bypass graft with saphenous vein on 09/09/2011. Patient had previously been scheduled for revascularization procedure prior to this admission. He was aware that he would possibly need transmetatarsal amputation of the left foot after successful revascularization. Because of vascular procedures performed patient was transferred to the step down unit immediately postoperative from the postanesthesia care unit.  Subjective: C/o pain in foot. Quite concerned about this - no other complaints.   Objective: Blood pressure 164/82, pulse 82, temperature 98.1 F (36.7 C), temperature source Oral, resp. rate 16, height 5\' 6"  (1.676 m), weight 69.1 kg (152 lb 5.4 oz), SpO2 94.00%.  Intake/Output from previous day: 06/21 0701 - 06/22 0700 In: 1190 [P.O.:340; I.V.:500; Blood:350] Out: 3408 [Blood:100] Intake/Output this shift:    General appearance: cooperative, appears older than stated age and no distress Resp: clear to auscultation bilaterally Cardio: regular rate and sinus rhythm, S1, S2 normal, no murmur, click, rub or gallop GI: soft, non-tender; bowel sounds normal; no masses,  no organomegaly Extremities: no signif LE edema - dry gangrenous toes on L foot Neurologic: Grossly normal  Lab Results:  Basename 09/12/11 0410 09/11/11 1153  WBC 9.7 9.5  HGB 7.8* 7.6*  HCT 24.5* 24.1*  PLT 190 186   BMET  Basename 09/12/11 0410 09/11/11 0410  NA 138 135  K 4.4 4.3  CL 102 101  CO2 26 27  GLUCOSE 106* 79  BUN 13 8  CREATININE 4.57* 3.41*  CALCIUM 8.6 8.5   Medications: I have reviewed the patient's current medications.  Assessment/Plan:  Septic shock *Resolved *Multifactorial etiology related to healthcare acquired pneumonia as well as recalcitrant C. difficile colitis  C. difficile colitis- recalcitrant *Continue oral vancomycin *Avoid Nexium to avoid precipitating C. Difficile colitis *Minimize other antibiotic therapy as able  Acute respiratory failure with  hypoxia/Hospital-acquired pneumonia *Briefly required intubation during the initial portion of hospitalization *Now tolerating nasal cannula oxygen  *Last chest x-ray 08/29/2011 demonstrated possible bilateral pneumonia so repeat chest x-ray 09/10/2011 shows no evidence of pneumonia *Currently on Zinacef but this appears to have been ordered not for pneumonia but for empiric postoperative coverage after vascular surgery  Peripheral vascular occlusive disease/Ischemia of left foot *Status post revascularization procedure 09/09/2011 *s/p transmetatarsal amputation - 09/12/2011  CKD (chronic kidney disease) stage V requiring chronic dialysis *Nephrology managing *Dialyzes on Monday Wednesday and Friday  Metabolic encephalopathy *Resolved and related to acute septic illness  Anemia of chronic renal failure - baseline hgb 9-10 *Receiving blood today during hemodialysis *Has received 5 units of packed red blood cells so far this this admission *Given history of coronary artery disease would like to keep hemoglobin greater than or equal to 8.0 *Continue Aranesp  Chronic diastolic CHF (congestive heart failure) *compensated *Keep blood pressure controled *Acute severe exacerbations best treated by urgent dialysis  Hypertension *Continue Norvasc and metoprolol *Blood pressure elevated likely due to pain  Atrial fibrillation *Maintaining sinus rhythm  CAD (coronary artery disease) *Continue aspirin, beta blocker and statin *Blood pressure just now rebounding- resume Imdur   Disposition *Patient would like to continue rehabilitation at the [facility] skilled nursing facility   LOS: 18 days  09/13/2011, 5:20 PM  Calvert Cantor, MD [phone]

## 2011-09-13 NOTE — Progress Notes (Signed)
Pt tx 2000 per MD order, pt VSS, pt verbalized understanding of tx, report called to tx RN, all questions answered

## 2011-09-14 DIAGNOSIS — N2581 Secondary hyperparathyroidism of renal origin: Secondary | ICD-10-CM

## 2011-09-14 DIAGNOSIS — N186 End stage renal disease: Secondary | ICD-10-CM

## 2011-09-14 DIAGNOSIS — A0472 Enterocolitis due to Clostridium difficile, not specified as recurrent: Secondary | ICD-10-CM

## 2011-09-14 DIAGNOSIS — I70269 Atherosclerosis of native arteries of extremities with gangrene, unspecified extremity: Secondary | ICD-10-CM

## 2011-09-14 LAB — CBC
HCT: 26.7 % — ABNORMAL LOW (ref 39.0–52.0)
MCHC: 31.8 g/dL (ref 30.0–36.0)
MCV: 89.6 fL (ref 78.0–100.0)
Platelets: 192 10*3/uL (ref 150–400)
RDW: 17.7 % — ABNORMAL HIGH (ref 11.5–15.5)
WBC: 10.1 10*3/uL (ref 4.0–10.5)

## 2011-09-14 NOTE — Progress Notes (Signed)
[facility] Progress Note  09/14/2011  Interval history:  This is a 61 y/o male with peripheral vascular disease and ESRD who was recently hospitalized for severe sepsis due to c.diff. He was readmitted through the [facility] ED on 08/26/11 with respiratory failure and sepsis. His family states that he had been doing well since his d/c home on 6/1, but developed constipation on 6/3. On 6/4 he developed nausea and vomiting in the evening followed by respiratory distress and confusion. He was intubated in the ED for respiratory failure with associated encephalopathy. He last went to HD on 6/3. Etiology for respiratory failure was felt likely related to aspiration versus healthcare acquired pneumonia by pulmonary critical care medicine. He was extubated on 08/29/2011. CT scan performed 08/27/2011 demonstrated a possible closed loop bowel obstruction. Patient was evaluated by surgery and found to have no indications for surgical intervention. The abnormal CT scan findings were likely related to severe ileus from recalcitrant C. difficile colitis. Patient is currently being treated with oral vancomycin with Flagyl and previously this admission received vancomycin enemas. Because of concerns of possible intra-abdominal sepsis and for treatment of healthcare acquired pneumonia patient was started on imipenem and intravenous vancomycin-both of which have been discontinued. Patient improved enough to transfer back to the general medical floor under the care of the hospitalist service. Once patient's initial encephalopathy improved he was complaining of left foot pain and was found to have black discoloration of his toes that was concerning for gangrene. Because of these findings vascular surgery was consulted. Workup did reveal ischemia of the left lower extremity and he subsequently underwent left common femoral artery endarterectomy with vein patch angioplasty and a left femoral to below-the-knee popliteal artery bypass  graft with saphenous vein on 09/09/2011. Patient had previously been scheduled for revascularization procedure prior to this admission. He was aware that he would possibly need transmetatarsal amputation of the left foot after successful revascularization. Because of vascular procedures performed patient was transferred to the step down unit immediately postoperative from the postanesthesia care unit.   Subjective: Pt received from team 1, Reviewed chart and notes.  Pt ate some breakfast this morning, last bm was 2 days ago, denies constipation and diarrhea, reports some pain in foot but otherwise no complaints  Objective:  Vital signs in last 24 hours: Filed Vitals:   09/13/11 0757 09/13/11 1212 09/13/11 2254 09/13/11 2300  BP: 143/79 164/82 94/54 147/87  Pulse: 115 82 79   Temp: 98.9 F (37.2 C) 98.1 F (36.7 C) 98.3 F (36.8 C)   TempSrc: Oral Oral Oral   Resp: 16 16 18    Height:      Weight:      SpO2: 100% 94% 90%    Weight change:   Intake/Output Summary (Last 24 hours) at 09/14/11 0835 Last data filed at 09/13/11 0900  Gross per 24 hour  Intake      0 ml  Output      0 ml  Net      0 ml   Lab Results  Component Value Date   HGBA1C  Value: 4.3 (NOTE) The ADA recommends the following therapeutic goal for glycemic control related to Hgb A1c measurement: Goal of therapy: <6.5 Hgb A1c  Reference: American Diabetes Association: Clinical Practice Recommendations 2010, Diabetes Care, 2010, 33: (Suppl  1).* 07/22/2008   Lab Results  Component Value Date   [facility]  Value: 100        Total Cholesterol/HDL:CHD Risk Coronary Heart Disease Risk Table  Men   Women  1/2 Average Risk   3.4   3.3  Average Risk       5.0   4.4  2 X Average Risk   9.6   7.1  3 X Average Risk  23.4   11.0        Use the calculated Patient Ratio above and the CHD Risk Table to determine the patient's CHD Risk.        ATP III CLASSIFICATION (LDL):  <100     mg/dL   Optimal  161-096  mg/dL    Near or Above                    Optimal  130-159  mg/dL   Borderline  045-409  mg/dL   High  >811     mg/dL   Very High* 11/22/4780   CREATININE 4.57* 09/12/2011    Review of Systems As above, otherwise all reviewed and reported negative  Physical Exam General appearance: cooperative, appears older than stated age and no distress  Resp: clear to auscultation bilaterally  Cardio: regular rate and sinus rhythm, S1, S2 normal, no murmur, click, rub or gallop  GI: soft, non-tender; bowel sounds normal; no masses, no organomegaly  Extremities: no signif LE edema - wound L foot clean, dry  Neurologic: Grossly normal   Lab Results: Results for orders placed during the hospital encounter of 08/26/11 (from the past 24 hour(s))  GLUCOSE, CAPILLARY     Status: Normal   Collection Time   09/13/11  4:17 PM      Component Value Range   Glucose-Capillary 72  70 - 99 mg/dL   Comment 1 Notify RN      Micro Results: No results found for this or any previous visit (from the past 240 hour(s)).  Medications:  Scheduled Meds:   . amLODipine  10 mg Oral Daily  . antiseptic oral rinse  15 mL Mouth Rinse q12n4p  . chlorhexidine  15 mL Mouth Rinse BID  . cinacalcet  60 mg Oral Q breakfast  . collagenase   Topical Daily  . darbepoetin (ARANESP) injection - DIALYSIS  100 mcg Intravenous Q Wed-HD  . docusate sodium  100 mg Oral Daily  . doxercalciferol  6 mcg Intravenous Q M,W,F-HD  . feeding supplement (NEPRO CARB STEADY)  237 mL Oral BID BM  . isosorbide mononitrate  30 mg Oral Daily  . metoprolol tartrate  100 mg Oral BID  . multivitamin  1 tablet Oral QHS  . simvastatin  5 mg Oral QHS  . sodium chloride  10-40 mL Intracatheter Q12H  . vancomycin  125 mg Oral Q6H   Continuous Infusions:  PRN Meds:.sodium chloride, acetaminophen, acetaminophen, bisacodyl, Glycerin (Adult), heparin, heparin, heparin, hydrALAZINE, HYDROmorphone (DILAUDID) injection, labetalol, metoprolol, ondansetron,  oxyCODONE-acetaminophen, phenol, senna-docusate, sodium chloride, DISCONTD:  HYDROmorphone (DILAUDID) injection  Assessment/Plan: Septic shock  *Resolved  *Multifactorial etiology related to healthcare acquired pneumonia as well as recalcitrant C. difficile colitis   C. difficile colitis- recalcitrant  *Continue oral vancomycin QID *Avoid Nexium to avoid precipitating C. Difficile colitis  *Minimize other antibiotic therapy as able   Acute respiratory failure with hypoxia/Hospital-acquired pneumonia  *Briefly required intubation during the initial portion of hospitalization  *Now tolerating nasal cannula oxygen  *Last chest x-ray 08/29/2011 demonstrated possible bilateral pneumonia and repeated chest x-ray 09/10/2011 shows no evidence of pneumonia   Peripheral vascular occlusive disease/Ischemia of left foot  *Status post revascularization procedure 09/09/2011  *Post op  s/p left transmetatarsal amputation  *Management per vascular team   CKD (chronic kidney disease) stage V requiring chronic dialysis  *Nephrology managing  *Dialyzes on Monday Wednesday and Friday   Metabolic encephalopathy  *Resolved and related to acute septic illness   Anemia of chronic renal failure - baseline hgb 9-10  *Has received 5 units of packed red blood cells so far this this admission  *Given history of coronary artery disease would like to keep hemoglobin greater than or equal to 8.0  *Continue Aranesp  *check CBC this am  Chronic diastolic CHF (congestive heart failure)  *compensated  *Keep blood pressure controled  *Acute severe exacerbations best treated by urgent dialysis   Hypertension  *Continue Norvasc and metoprolol  *Blood pressure well controlled  *Was on Prinivil as well at home and this is currently on hold   Atrial fibrillation  *Maintaining sinus rhythm   CAD (coronary artery disease)  *Continue aspirin, beta blocker, imdur and statin    Disposition  *Patient would like  to continue rehabilitation at the [facility] skilled nursing facility   LOS: 19 days   Darril Patriarca 09/14/2011, 8:35 AM  Cleora Fleet, MD, CDE, FAAFP [facility] [place], [place]  [phone]

## 2011-09-14 NOTE — Progress Notes (Addendum)
[facility] OF [place]  Progress Note Bypass Surgery  Date of Surgery: 08/26/2011 - 09/12/2011  Procedure(s): AMPUTATION BELOW KNEE Surgeon: Surgeon(s): [name], MD  2 Days Post-Op  History of Present Illness  [name] is a 61 y.o. male who is S/P Procedure(s): AMPUTATION transmetatarsal AND FEM-POP BY PASS  left.  The patient's pre-op symptoms of Pain are Improved . Patients pain is well controlled.       Imaging: No results found.  Significant Diagnostic Studies: CBC Lab Results  Component Value Date   WBC 9.7 09/12/2011   HGB 7.8* 09/12/2011   HCT 24.5* 09/12/2011   MCV 88.8 09/12/2011   PLT 190 09/12/2011    BMET     Component Value Date/Time   NA 138 09/12/2011 0410   K 4.4 09/12/2011 0410   CL 102 09/12/2011 0410   CO2 26 09/12/2011 0410   GLUCOSE 106* 09/12/2011 0410   BUN 13 09/12/2011 0410   CREATININE 4.57* 09/12/2011 0410   CALCIUM 8.6 09/12/2011 0410   GFRNONAA 13* 09/12/2011 0410   GFRAA 15* 09/12/2011 0410    COAG Lab Results  Component Value Date   INR 1.30 09/12/2011   INR 1.38 08/27/2011   INR 1.54* 08/19/2011   No results found for this basename: PTT    Physical Examination  BP Readings from Last 3 Encounters:  09/13/11 147/87  09/13/11 147/87  09/13/11 147/87   Temp Readings from Last 3 Encounters:  09/13/11 98.3 F (36.8 C) Oral  09/13/11 98.3 F (36.8 C) Oral  09/13/11 98.3 F (36.8 C) Oral   SpO2 Readings from Last 3 Encounters:  09/13/11 90%  09/13/11 90%  09/13/11 90%   Pulse Readings from Last 3 Encounters:  09/13/11 79  09/13/11 79  09/13/11 79    Pt is A&O x 3 left lower extremity: Incision/s is/are clean,dry.intact, and  healing without hematoma, erythema or drainage.  Transmetatarsal incision appears healthy.  No erythema or edema.  No active drainage or bleeding.  Mod dry blood on post-op dressing.  Clean dry dressing applied today.  Groin incision clean and dry on the left.  I  discontinued the wound vac at bedside. Limb is warm; with good color    Assessment/Plan: Pt. Doing well Post-op pain is controlled Wounds are clean, dry, intact or healing well. Continue wound care as ordered QD left foot dressing changes. Right femoral IV access.  Question any other access?  Will discuss this with Dr. Diamond Nickel, EMMA [facility] [phone] 09/14/2011 9:17 AM   I agree with the above. The patient was seen and examined today. His incisions are healing nicely. The transmetatarsal amputation site looks healthy. His wound VAC was removed today. I will continue with mobilization. Hopefully he can be discharged in the near future. He may require inpatient rehabilitation.  Durene Cal

## 2011-09-14 NOTE — Progress Notes (Signed)
Subjective:  "just sore".  Moved to 2000  Objective:    Vital signs in last 24 hours: Filed Vitals:   09/13/11 0757 09/13/11 1212 09/13/11 2254 09/13/11 2300  BP: 143/79 164/82 94/54 147/87  Pulse: 115 82 79   Temp: 98.9 F (37.2 C) 98.1 F (36.7 C) 98.3 F (36.8 C)   TempSrc: Oral Oral Oral   Resp: 16 16 18    Height:      Weight:      SpO2: 100% 94% 90%    Weight change:   Intake/Output Summary (Last 24 hours) at 09/14/11 0843 Last data filed at 09/13/11 0900  Gross per 24 hour  Intake      0 ml  Output      0 ml  Net      0 ml   Labs: Basic Metabolic Panel:  Lab 09/12/11 4098 09/11/11 0410 09/10/11 0730 09/10/11 0500 09/09/11 1504  NA 138 135 136 137 138  K 4.4 4.3 3.9 4.0 3.8  CL 102 101 99 102 --  CO2 26 27 24 25  --  GLUCOSE 106* 79 77 83 79  BUN 13 8 15 15  --  CREATININE 4.57* 3.41* 5.12* 4.91* --  ALB -- -- -- -- --  CALCIUM 8.6 8.5 7.9* 8.0* --  PHOS 4.3 -- 4.8* -- --   Liver Function Tests:  Lab 09/12/11 0410 09/10/11 0730  AST -- --  ALT -- --  ALKPHOS -- --  BILITOT -- --  PROT -- --  ALBUMIN 1.6* 1.9*   No results found for this basename: LIPASE:3,AMYLASE:3 in the last 168 hours No results found for this basename: AMMONIA:3 in the last 168 hours CBC:  Lab 09/12/11 0410 09/11/11 1153 09/11/11 0410 09/10/11 0730  WBC 9.7 9.5 8.7 11.6*  NEUTROABS -- -- -- --  HGB 7.8* 7.6* 6.3* 7.4*  HCT 24.5* 24.1* 20.2* 24.0*  MCV 88.8 88.3 91.4 90.9  PLT 190 186 195 205   Cardiac Enzymes: No results found for this basename: CKTOTAL:5,CKMB:5,CKMBINDEX:5,TROPONINI:5 in the last 168 hours CBG:  Lab 09/13/11 1617 09/09/11 0446 09/09/11 0035  GLUCAP 72 83 89    Iron Studies: No results found for this basename: IRON:30,TIBC:30,SATURATION RATIOS:30,TRANSFERRIN:30,FERRITIN:30 in the last 168 hours  Physical Exam:  Blood pressure 147/87, pulse 79, temperature 98.3 F (36.8 C), temperature source Oral, resp. rate 18, height 5\' 6"  (1.676 m), weight 69.1 kg  (152 lb 5.4 oz), SpO2 90.00%.  JXB:JYNWGNFAOZH in bed YQM:VHQIO RRR, normal S1 and S2  Resp:CTA bilaterally, no rales/rhonchi  NGE:XBMW, flat, NT, BS normal  Ext:s/p mid foot amp  no LE edema  Impression/Plan   1. Sepsis Syndrome with C diff colitis/PNA: Improved - now on PO vanco only for C diff.  2. PVD-  S/p fempop and mid foot amputation yesterday.  They are hopeful for healing.    Per [facility] 3. ESRD: continue MWF HD via [facility] (apparently no other options?).  Regular unit is [facility].  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  Phos is 4.3 5. HTN/volume-  controlled with HD and meds 6.  Anemia- hgb low- on aranesp.  Got total of 2 units 6/20 and 6/21. Continue to follow 7.   Dispo- search in process for SNF, ?[facility].  Could maybe take out femoral triple lumen ?  no IV meds that I can see other than PRN dilauded. I am concerned about infection.    [name] A

## 2011-09-15 ENCOUNTER — Inpatient Hospital Stay (HOSPITAL_COMMUNITY): Payer: Medicare Other

## 2011-09-15 DIAGNOSIS — I70269 Atherosclerosis of native arteries of extremities with gangrene, unspecified extremity: Secondary | ICD-10-CM

## 2011-09-15 DIAGNOSIS — A0472 Enterocolitis due to Clostridium difficile, not specified as recurrent: Secondary | ICD-10-CM

## 2011-09-15 DIAGNOSIS — S98919A Complete traumatic amputation of unspecified foot, level unspecified, initial encounter: Secondary | ICD-10-CM

## 2011-09-15 DIAGNOSIS — N186 End stage renal disease: Secondary | ICD-10-CM

## 2011-09-15 DIAGNOSIS — N2581 Secondary hyperparathyroidism of renal origin: Secondary | ICD-10-CM

## 2011-09-15 LAB — RENAL FUNCTION PANEL
BUN: 21 mg/dL (ref 6–23)
CO2: 27 mEq/L (ref 19–32)
Chloride: 100 mEq/L (ref 96–112)
Creatinine, Ser: 5.69 mg/dL — ABNORMAL HIGH (ref 0.50–1.35)

## 2011-09-15 LAB — CBC
HCT: 25.1 % — ABNORMAL LOW (ref 39.0–52.0)
Hemoglobin: 8 g/dL — ABNORMAL LOW (ref 13.0–17.0)
MCH: 28.3 pg (ref 26.0–34.0)
MCV: 88.7 fL (ref 78.0–100.0)
RBC: 2.83 MIL/uL — ABNORMAL LOW (ref 4.22–5.81)

## 2011-09-15 LAB — RETICULOCYTES: Retic Ct Pct: 3.3 % — ABNORMAL HIGH (ref 0.4–3.1)

## 2011-09-15 MED ORDER — GABAPENTIN 100 MG PO CAPS
100.0000 mg | ORAL_CAPSULE | ORAL | Status: DC
  Filled 2011-09-15: qty 1

## 2011-09-15 MED ORDER — GABAPENTIN 100 MG PO CAPS
200.0000 mg | ORAL_CAPSULE | ORAL | Status: DC
  Administered 2011-09-15: 200 mg via ORAL
  Filled 2011-09-15: qty 2

## 2011-09-15 MED ORDER — DOXERCALCIFEROL 4 MCG/2ML IV SOLN
INTRAVENOUS | Status: AC
  Administered 2011-09-15: 6 ug via INTRAVENOUS
  Filled 2011-09-15: qty 4

## 2011-09-15 MED ORDER — HYDROMORPHONE HCL PF 1 MG/ML IJ SOLN
INTRAMUSCULAR | Status: AC
  Administered 2011-09-15: 1 mg
  Filled 2011-09-15: qty 1

## 2011-09-15 MED ORDER — SACCHAROMYCES BOULARDII 250 MG PO CAPS
250.0000 mg | ORAL_CAPSULE | Freq: Two times a day (BID) | ORAL | Status: DC
  Administered 2011-09-15 – 2011-09-16 (×3): 250 mg via ORAL
  Filled 2011-09-15 (×4): qty 1

## 2011-09-15 MED ORDER — HYDROCODONE-ACETAMINOPHEN 5-325 MG PO TABS
1.0000 | ORAL_TABLET | ORAL | Status: DC | PRN
  Administered 2011-09-16 (×2): 2 via ORAL
  Filled 2011-09-15 (×2): qty 2

## 2011-09-15 NOTE — Progress Notes (Signed)
OT Cancellation Note  Treatment cancelled today due to patient receiving procedure or test. Pt currently in HD. Will check back another time  Detria Cummings A OTR/L [phone] 09/15/2011, 11:41 AM

## 2011-09-15 NOTE — Progress Notes (Signed)
[facility] Progress Note  09/15/2011 Interval history:  This is a 61 y/o male with peripheral vascular disease and ESRD who was recently hospitalized for severe sepsis due to c.diff. He was readmitted through the [facility] ED on 08/26/11 with respiratory failure and sepsis. His family states that he had been doing well since his d/c home on 6/1, but developed constipation on 6/3. On 6/4 he developed nausea and vomiting in the evening followed by respiratory distress and confusion. He was intubated in the ED for respiratory failure with associated encephalopathy. He last went to HD on 6/3. Etiology for respiratory failure was felt likely related to aspiration versus healthcare acquired pneumonia by pulmonary critical care medicine. He was extubated on 08/29/2011. CT scan performed 08/27/2011 demonstrated a possible closed loop bowel obstruction. Patient was evaluated by surgery and found to have no indications for surgical intervention. The abnormal CT scan findings were likely related to severe ileus from recalcitrant C. difficile colitis. Patient is currently being treated with oral vancomycin with Flagyl and previously this admission received vancomycin enemas. Because of concerns of possible intra-abdominal sepsis and for treatment of healthcare acquired pneumonia patient was started on imipenem and intravenous vancomycin-both of which have been discontinued. Patient improved enough to transfer back to the general medical floor under the care of the hospitalist service. Once patient's initial encephalopathy improved he was complaining of left foot pain and was found to have black discoloration of his toes that was concerning for gangrene. Because of these findings vascular surgery was consulted. Workup did reveal ischemia of the left lower extremity and he subsequently underwent left common femoral artery endarterectomy with vein patch angioplasty and a left femoral to below-the-knee popliteal artery bypass  graft with saphenous vein on 09/09/2011. Patient had previously been scheduled for revascularization procedure prior to this admission. He was aware that he would possibly need transmetatarsal amputation of the left foot after successful revascularization. Because of vascular procedures performed patient was transferred to the step down unit immediately postoperative from the postanesthesia care unit and subsequently transferred to medical floor.  He is being evaluated for inpatient rehabilitation.     Subjective: Pt reports that he is excited at the opportunity to possibly go to rehab.  He says he still has pain in left foot, but controlled with pain medications.    Objective:  Vital signs in last 24 hours: Filed Vitals:   09/15/11 0930 09/15/11 1000 09/15/11 1030 09/15/11 1053  BP: 161/54 166/76 161/76 166/71  Pulse: 80 85 83 79  Temp:    98.6 F (37 C)  TempSrc:    Oral  Resp: 18 23 23 15   Height:      Weight:    61.6 kg (135 lb 12.9 oz)  SpO2:    94%   Weight change:   Intake/Output Summary (Last 24 hours) at 09/15/11 1310 Last data filed at 09/15/11 1053  Gross per 24 hour  Intake    240 ml  Output   2971 ml  Net  -2731 ml   Lab Results  Component Value Date   HGBA1C  Value: 4.3 (NOTE) The ADA recommends the following therapeutic goal for glycemic control related to Hgb A1c measurement: Goal of therapy: <6.5 Hgb A1c  Reference: American Diabetes Association: Clinical Practice Recommendations 2010, Diabetes Care, 2010, 33: (Suppl  1).* 07/22/2008   Lab Results  Component Value Date   [facility]  Value: 100        Total Cholesterol/HDL:CHD Risk Coronary Heart Disease Risk  Table                     Men   Women  1/2 Average Risk   3.4   3.3  Average Risk       5.0   4.4  2 X Average Risk   9.6   7.1  3 X Average Risk  23.4   11.0        Use the calculated Patient Ratio above and the CHD Risk Table to determine the patient's CHD Risk.        ATP III CLASSIFICATION (LDL):  <100     mg/dL    Optimal  045-409  mg/dL   Near or Above                    Optimal  130-159  mg/dL   Borderline  811-914  mg/dL   High  >782     mg/dL   Very High* 11/27/6211   CREATININE 5.69* 09/15/2011    Review of Systems As above, otherwise all reviewed and reported negative  Physical Exam General appearance: cooperative, appears older than stated age and no distress  Resp: clear to auscultation bilaterally  Cardio: regular rate and sinus rhythm, S1, S2 normal, no murmur, click, rub or gallop  GI: soft, non-tender; bowel sounds normal; no masses, no organomegaly  Extremities: no signif LE edema - wound L foot clean, dry  Neurologic: Grossly normal  Lab Results: Results for orders placed during the hospital encounter of 08/26/11 (from the past 24 hour(s))  RENAL FUNCTION PANEL     Status: Abnormal   Collection Time   09/15/11  6:57 AM      Component Value Range   Sodium 138  135 - 145 mEq/L   Potassium 3.9  3.5 - 5.1 mEq/L   Chloride 100  96 - 112 mEq/L   CO2 27  19 - 32 mEq/L   Glucose, Bld 81  70 - 99 mg/dL   BUN 21  6 - 23 mg/dL   Creatinine, Ser 0.86 (*) 0.50 - 1.35 mg/dL   Calcium 8.1 (*) 8.4 - 10.5 mg/dL   Phosphorus 4.5  2.3 - 4.6 mg/dL   Albumin 1.5 (*) 3.5 - 5.2 g/dL   GFR calc non Af Amer 10 (*) >90 mL/min   GFR calc Af Amer 11 (*) >90 mL/min  CBC     Status: Abnormal   Collection Time   09/15/11  6:57 AM      Component Value Range   WBC 8.4  4.0 - 10.5 K/uL   RBC 2.83 (*) 4.22 - 5.81 MIL/uL   Hemoglobin 8.0 (*) 13.0 - 17.0 g/dL   HCT 57.8 (*) 46.9 - 62.9 %   MCV 88.7  78.0 - 100.0 fL   MCH 28.3  26.0 - 34.0 pg   MCHC 31.9  30.0 - 36.0 g/dL   RDW 52.8 (*) 41.3 - 24.4 %   Platelets 179  150 - 400 K/uL   Micro Results: No results found for this or any previous visit (from the past 240 hour(s)).  Medications:  Scheduled Meds:   . amLODipine  10 mg Oral Daily  . antiseptic oral rinse  15 mL Mouth Rinse q12n4p  . chlorhexidine  15 mL Mouth Rinse BID  . cinacalcet  60 mg  Oral Q breakfast  . collagenase   Topical Daily  . darbepoetin (ARANESP) injection - DIALYSIS  100 mcg Intravenous Q Wed-HD  .  docusate sodium  100 mg Oral Daily  . doxercalciferol  6 mcg Intravenous Q M,W,F-HD  . feeding supplement (NEPRO CARB STEADY)  237 mL Oral BID BM  . HYDROmorphone      . isosorbide mononitrate  30 mg Oral Daily  . metoprolol tartrate  100 mg Oral BID  . multivitamin  1 tablet Oral QHS  . simvastatin  5 mg Oral QHS  . sodium chloride  10-40 mL Intracatheter Q12H  . vancomycin  125 mg Oral Q6H   Continuous Infusions:  PRN Meds:.sodium chloride, acetaminophen, acetaminophen, bisacodyl, Glycerin (Adult), heparin, heparin, heparin, hydrALAZINE, HYDROmorphone (DILAUDID) injection, labetalol, metoprolol, ondansetron, oxyCODONE-acetaminophen, phenol, senna-docusate, sodium chloride  Assessment/Plan:  Septic shock  *Resolved  *Multifactorial etiology related to healthcare acquired pneumonia as well as recalcitrant C. difficile colitis   C. difficile colitis- recalcitrant  *Continue oral vancomycin QID 14 days *Avoid Nexium to avoid precipitating C. Difficile colitis  *Minimize other antibiotic therapy as able   Acute respiratory failure with hypoxia/Hospital-acquired pneumonia  *Briefly required intubation during the initial portion of hospitalization  *Now tolerating nasal cannula oxygen  *Last chest x-ray 08/29/2011 demonstrated possible bilateral pneumonia and repeated chest x-ray 09/10/2011 shows no evidence of pneumonia   Peripheral vascular occlusive disease/Ischemia of left foot  *Status post revascularization procedure 09/09/2011  *Post op s/p left transmetatarsal amputation  *Management per vascular team   CKD (chronic kidney disease) stage V requiring chronic dialysis  *Nephrology managing  *Dialyzes on Monday Wednesday and Friday   Metabolic encephalopathy  *Resolved and related to acute septic illness   Anemia of chronic renal failure -  baseline hgb 9-10  *Has received 5 units of packed red blood cells so far this this admission  *Given history of coronary artery disease would like to keep hemoglobin greater than or equal to 8.0  *Continue Aranesp  *following CVC, currently holding around 8  Chronic diastolic CHF (congestive heart failure)  *compensated  *Keep blood pressure controlled  *Acute severe exacerbations best treated by urgent dialysis   Hypertension  *Continue Norvasc and metoprolol  *Blood pressure well controlled  *Was on Prinivil as well at home and this is currently on hold   Atrial fibrillation  *Maintaining sinus rhythm   CAD (coronary artery disease)  *Continue aspirin, beta blocker, imdur and statin   Disposition  *Patient would like to continue rehabilitation, under consideration of inpatient rehab CIR [facility] today.   LOS: 20 days   Zay Yeargan JohnsonMD 09/15/2011, 1:10 PM  Cleora Fleet, MD, CDE, FAAFP [facility] [place], [place]  [phone]

## 2011-09-15 NOTE — Progress Notes (Signed)
Per Rehab MD consult today, patient is appropriate for CIR. Patient is extremely motivated to return home. Discussed that patient would need supervision level of care after short CIR stay. Will contact patient's family to discuss this and see what they can provide. Will f/u in am. Toni Amend adm coordinator [phone].

## 2011-09-15 NOTE — Progress Notes (Signed)
Patient referred to this CSW today- per report from previous CSW patient has refused LTAC referral and is hopeful for CIR. Patient was faxed out and was offered SNF bed at [facility] if he agrees and if this available at time of d/c this would still be an option. Patient receiving a pulsatile lavage treatment at this time- this CSW will f/u with patient at a later time. Reece Levy, MSW, Theresia Majors [phone]

## 2011-09-15 NOTE — Progress Notes (Addendum)
MEDICATION RELATED CONSULT NOTE - INITIAL   Pharmacy Consult for Weaning of Pain Meds Indication: Post-op L trans-metatarsal amputation on 6/21  No Known Allergies  Patient Measurements: Height: 5\' 6"  (167.6 cm) Weight: 135 lb 12.9 oz (61.6 kg) IBW/kg (Calculated) : 63.8   Vital Signs: Temp: 98.4 F (36.9 C) (06/24 1424) Temp src: Oral (06/24 1424) BP: 160/69 mmHg (06/24 1424) Pulse Rate: 80  (06/24 1424) Intake/Output from previous day: 06/23 0701 - 06/24 0700 In: 360 [P.O.:360] Out: -  Intake/Output from this shift: Total I/O In: -  Out: 2971 [Other:2971]  Labs:  [facility] 09/15/11 0657 09/14/11 1100  WBC 8.4 10.1  HGB 8.0* 8.5*  HCT 25.1* 26.7*  PLT 179 192  APTT -- --  CREATININE 5.69* --  LABCREA -- --  CREATININE 5.69* --  CREAT24HRUR -- --  MG -- --  PHOS 4.5 --  ALBUMIN 1.5* --  PROT -- --  ALBUMIN 1.5* --  AST -- --  ALT -- --  ALKPHOS -- --  BILITOT -- --  BILIDIR -- --  IBILI -- --   Estimated Creatinine Clearance: 11.7 ml/min (by C-G formula based on Cr of 5.69).   Microbiology: Recent Results (from the past 720 hour(s))  CULTURE, BLOOD (ROUTINE X 2)     Status: Normal   Collection Time   08/26/11 11:49 PM      Component Value Range Status Comment   Specimen Description BLOOD LEFT ARM   Final    Special Requests BOTTLES DRAWN AEROBIC ONLY [facility]   Final    Culture  Setup Time [id number]   Final    Culture NO GROWTH 5 DAYS   Final    Report Status 09/02/2011 FINAL   Final   CULTURE, BLOOD (ROUTINE X 2)     Status: Normal   Collection Time   08/26/11 11:56 PM      Component Value Range Status Comment   Specimen Description BLOOD LEFT HAND   Final    Special Requests BOTTLES DRAWN AEROBIC ONLY [facility]   Final    Culture  Setup Time [id number]   Final    Culture NO GROWTH 5 DAYS   Final    Report Status 09/02/2011 FINAL   Final   MRSA PCR SCREENING     Status: Normal   Collection Time   08/27/11  2:55 AM      Component Value Range Status  Comment   MRSA by PCR NEGATIVE  NEGATIVE Final   CLOSTRIDIUM DIFFICILE BY PCR     Status: Abnormal   Collection Time   08/27/11  8:54 PM      Component Value Range Status Comment   C difficile by pcr POSITIVE (*) NEGATIVE Final     Medical History: Past Medical History  Diagnosis Date  . Hyperlipidemia   . ESRD (end stage renal disease)     a. MWF dialysis in [place] (followed by Dr. Kristian Covey)  . Arthritis   . Claudication   . Stroke 2005  . Anemia   . Chronic diastolic CHF (congestive heart failure) 07/2010    a. 07/2008 Echo EF 50-55%, mild-mod LVH, Gr 1 DD.  Marland Kitchen Peripheral vascular occlusive disease     a. 07/2011 Periph Angio: No signif Ao-illiac dzs, LCF stenosis, 100% LSFA w recon above knee pop and 3 vessel runoff, 100% RSFA w/ recon above knee --> pending L Fem to below Knee pop bypass.  Marland Kitchen History of tobacco abuse   . Hypertension  takes Amlodipine,Metoprolol,and Prinivil daily  . Dyspnea on exertion     with exertion    Medications:  Scheduled:    . amLODipine  10 mg Oral Daily  . antiseptic oral rinse  15 mL Mouth Rinse q12n4p  . chlorhexidine  15 mL Mouth Rinse BID  . cinacalcet  60 mg Oral Q breakfast  . collagenase   Topical Daily  . darbepoetin (ARANESP) injection - DIALYSIS  100 mcg Intravenous Q Wed-HD  . docusate sodium  100 mg Oral Daily  . doxercalciferol  6 mcg Intravenous Q M,W,F-HD  . feeding supplement (NEPRO CARB STEADY)  237 mL Oral BID BM  . HYDROmorphone      . isosorbide mononitrate  30 mg Oral Daily  . metoprolol tartrate  100 mg Oral BID  . multivitamin  1 tablet Oral QHS  . saccharomyces boulardii  250 mg Oral BID  . simvastatin  5 mg Oral QHS  . sodium chloride  10-40 mL Intracatheter Q12H  . vancomycin  125 mg Oral Q6H    Assessment: 61 y.o. M with ESRD and history of drug abuse started on pain medications for pain-management s/p L trans-metatarsal amputation on 6/21. Pharmacy was consulted to help wean pain medications while still  maintaining adequate pain control.   Upon discussion with the patient, he states that he has been on "all sorts" of pain medications in the past. Most recently, prior to admission, he was taking OxyIR 5-10 mg every 6 hours as needed. The patient describes his pain as "burning and stabbing" and persistent. He states that when he realizes that he isn't hurting -- that's when it starts again.  The patient's pain regimen has been weaned significantly over the course of the past couple of days. Morphine & OxyIR were d/ced on 6/22, dilaudid IV was d/ced today. We will continue to try to wean his pain medications while attempting to maintain adequate pain control. His pain may be neuropathic in nature -- so we will plan to add gabapentin tonight (once a day dosing for ESRD) and switch from percocet to norco tomorrow morning. Eventually, if gabapentin appears to help (the dose can be titrated up) -- we can consider switching from norco to tramadol as this would be less prone to exacerbate his history of drug abuse.  Goal of Therapy:  Adequate pain control while minimizing pain medications  Plan:  1. Gabapentin 100 mg at bedtime on T/Th/Sat/Sun and 200 mg at bedtime on M/W/F 2. Starting on 6/25 -- will d/c Percocet and start Norco 5/325 1-2 tabs q4h prn 3. Will continue to follow pain management and adjust meds as needed.  Georgina Pillion, PharmD, BCPS Clinical Pharmacist Pager: [phone] 09/15/2011 4:33 PM

## 2011-09-15 NOTE — Consult Note (Signed)
Physical Medicine and Rehabilitation Consult Reason for Consult: Deconditioning/status post femoropopliteal bypass Referring Physician: [facility]   HPI: [name] is a 61 y.o. right-handed male with history of peripheral vascular disease as well end stage renal disease with hemodialysis. Recently hospitalized for severe sepsis due to C. difficile complicated by respiratory failure and sepsis. He was recently discharged home 08/23/2011. Presented with ischemic left foot on 09/08/2011. He has been followed by vascular surgery with plan for femoropopliteal bypass but delayed due to recent C. difficile. Underwent femoropopliteal bypass 09/09/2011 per Dr. Myra Gianotti. Ongoing ischemic changes to left lower extremity and ultimately underwent left transmetatarsal amputation 09/12/2011 per Dr. Myra Gianotti. Postoperative pain management as well as anemia 7.8. Hemodialysis ongoing as per renal services. Physical therapy evaluation completed 09/13/2011. M.D. has requested physical medicine rehabilitation consult to consider inpatient rehabilitation services   Review of Systems  Constitutional: Positive for malaise/fatigue.  Cardiovascular: Positive for leg swelling.  All other systems reviewed and are negative.   Past Medical History  Diagnosis Date  . Hyperlipidemia   . ESRD (end stage renal disease)     a. MWF dialysis in [place] (followed by Dr. Kristian Covey)  . Arthritis   . Claudication   . Stroke 2005  . Anemia   . Chronic diastolic CHF (congestive heart failure) 07/2010    a. 07/2008 Echo EF 50-55%, mild-mod LVH, Gr 1 DD.  Marland Kitchen Peripheral vascular occlusive disease     a. 07/2011 Periph Angio: No signif Ao-illiac dzs, LCF stenosis, 100% LSFA w recon above knee pop and 3 vessel runoff, 100% RSFA w/ recon above knee --> pending L Fem to below Knee pop bypass.  Marland Kitchen History of tobacco abuse   . Hypertension     takes Amlodipine,Metoprolol,and Prinivil daily  . Dyspnea on exertion     with exertion   Past  Surgical History  Procedure Date  . Arteriovenous graft placement   . Av fistula placement 12/10/2000    Right brachiocephalic arteriovenous   . Hernia repair   . Aortagram 07/29/2011    Abdominal Aortagram  . Cardiac catheterization 08/01/11    Left heart catheterization  . Femoral-popliteal bypass graft 09/09/2011    Procedure: BYPASS GRAFT FEMORAL-POPLITEAL ARTERY;  Surgeon: [name], MD;  Location: [facility] OR;  Service: Vascular;  Laterality: Left;   Family History  Problem Relation Age of Onset  . Breast cancer Mother   . Cancer Father   . Anesthesia problems Neg Hx   . Hypotension Neg Hx   . Malignant hyperthermia Neg Hx   . Pseudochol deficiency Neg Hx    Social History:  reports that he quit smoking about 5 months ago. His smoking use included Cigarettes. He has a 40 pack-year smoking history. He does not have any smokeless tobacco history on file. He reports that he does not drink alcohol or use illicit drugs. Allergies: No Known Allergies Medications Prior to Admission  Medication Sig Dispense Refill  . amLODipine (NORVASC) 10 MG tablet Take 10 mg by mouth daily.      Marland Kitchen aspirin EC 81 MG tablet Take 1 tablet (81 mg total) by mouth daily.  90 tablet  3  . calcitRIOL (ROCALTROL) 0.25 MCG capsule Take 0.25 mcg by mouth daily.      . calcium acetate (PHOSLO) 667 MG capsule Take 667-1,334 mg by mouth 3 (three) times daily with meals. 2 caps with each meal, 1 cap with snacks      . cinacalcet (SENSIPAR) 30 MG tablet Take 30  mg by mouth at bedtime.      . gabapentin (NEURONTIN) 100 MG capsule Take 100 mg by mouth 2 (two) times daily.      . isosorbide mononitrate (IMDUR) 30 MG 24 hr tablet Take 30 mg by mouth daily.      Marland Kitchen lanthanum (FOSRENOL) 1000 MG chewable tablet Chew 1,000-2,000 mg by mouth 3 (three) times daily with meals. 2 tabs with each meal, 1 tab with snacks      . lisinopril (PRINIVIL,ZESTRIL) 30 MG tablet Take 30 mg by mouth daily.      . metoprolol succinate  (TOPROL-XL) 100 MG 24 hr tablet Take 100 mg by mouth 2 (two) times daily. Take with or immediately following a meal.      . metroNIDAZOLE (FLAGYL) 500 MG tablet Take 1 tablet (500 mg total) by mouth every 8 (eight) hours.  20 tablet  0  . multivitamin (RENA-VIT) TABS tablet Take 1 tablet by mouth daily.      Marland Kitchen oxyCODONE (OXY IR/ROXICODONE) 5 MG immediate release tablet Take 1-2 tablets (5-10 mg total) by mouth every 6 (six) hours as needed for pain.  30 tablet  0  . sevelamer (RENVELA) 800 MG tablet Take 800-2,400 mg by mouth 3 (three) times daily with meals. Take 4 tabs with meals and 1 tab with snacks      . simvastatin (ZOCOR) 5 MG tablet Take 5 mg by mouth at bedtime.        Home: Home Living Lives With: Alone Available Help at Discharge: Friend(s);Family;Available PRN/intermittently Type of Home: Apartment Home Access: Level entry Home Layout: One level Bathroom Shower/Tub: Engineer, manufacturing systems: Standard Home Adaptive Equipment: Shower chair with back;Bedside commode/3-in-1;Straight cane;Hand-held shower hose  Functional History: Prior Function Able to Take Stairs?: Yes Driving: No Vocation: On disability Functional Status:  Mobility: Bed Mobility Bed Mobility: Rolling Right;Right Sidelying to Sit Rolling Right: 3: Mod assist Right Sidelying to Sit: 3: Mod assist;With rails;HOB flat Supine to Sit: 1: +2 Total assist;With rails Supine to Sit: Patient Percentage: 40% Sitting - Scoot to Edge of Bed: 3: Mod assist Sit to Supine: 4: Min guard Sit to Supine: Patient Percentage: 20% Transfers Transfers: Sit to Stand;Stand to [employer] Transfers Sit to Stand: 1: +2 Total assist;With upper extremity assist;From bed Sit to Stand: Patient Percentage: 50% Stand to Sit: 1: +2 Total assist;With upper extremity assist;With armrests;To chair/3-in-1 Stand to Sit: Patient Percentage: 50% Stand Pivot Transfers: 1: +2 Total assist;With armrests Stand Pivot Transfers:  Patient Percentage: 50% Ambulation/Gait Ambulation/Gait Assistance: Not tested (comment) Stairs: No Wheelchair Mobility Wheelchair Mobility: No  ADL: ADL Eating/Feeding: Performed;Supervision/safety Where Assessed - Eating/Feeding: Chair Grooming: Performed;Wash/dry face;Set up Where Assessed - Grooming: Supine, head of bed up Upper Body Bathing: Simulated;Set up Where Assessed - Upper Body Bathing: Unsupported sitting Lower Body Bathing: Simulated;Minimal assistance Where Assessed - Lower Body Bathing: Supported sit to stand Upper Body Dressing: Simulated;Supervision/safety Where Assessed - Upper Body Dressing: Unsupported sitting Lower Body Dressing: Performed;+1 Total assistance Where Assessed - Lower Body Dressing: Supine, head of bed up Toilet Transfer: Simulated;+2 Total assistance Toilet Transfer Method: Sit to stand Toilet Transfer Equipment: Raised toilet seat with arms (or 3-in-1 over toilet) Tub/Shower Transfer Method: Not assessed Equipment Used: Gait belt Transfers/Ambulation Related to ADLs: Assist to move RW provided during transfer ADL Comments: Pt supine on arrival asleep with lunch on bedside table. PT agreeable to OOB to chair. Pt required facilitation of weight shift and multimodal cueing for transfers.   Cognition:  Cognition Arousal/Alertness: Awake/alert Orientation Level: Oriented X4 Cognition Overall Cognitive Status: Impaired Area of Impairment: Safety/judgement Arousal/Alertness: Awake/alert Orientation Level: Appears intact for tasks assessed Behavior During Session: [facility] for tasks performed Memory Deficits: some of patient's answers to questions differ from prior admission.  Also states last admission was in December (actually May) Following Commands: Follows one step commands consistently Safety/Judgement: Decreased awareness of safety precautions Awareness of Errors: Assistance required to identify errors made Cognition - Other Comments:  Cognition to be further assessed in context of function.  Able to answer questions appropriately and follow multi step commands.  Blood pressure 165/80, pulse 77, temperature 98.8 F (37.1 C), temperature source Oral, resp. rate 13, height 5\' 6"  (1.676 m), weight 69.1 kg (152 lb 5.4 oz), SpO2 92.00%. Physical Exam  Vitals reviewed. Constitutional: He is oriented to person, place, and time.       Frail elderly male.  HENT:  Head: Normocephalic.  Neck: Neck supple. No thyromegaly present.  Cardiovascular: Normal rate.  An irregularly irregular rhythm present.  Occasional extrasystoles are present.  Pulmonary/Chest: Breath sounds normal. He has no wheezes.  Abdominal: He exhibits no distension. There is no tenderness.  Neurological: He is alert and oriented to person, place, and time.  Skin:       Surgical site clean dry and dressed  Psychiatric:       Mood is flat but appropriate  motor strength is 5/5 in bilateral bicep, tricep, grip, deltoid Right lower extremity is 5/5 in the hip flexor knee extensor and ankle dorsiflexor Left lower extremity is 4/5 in the hip flexor knee extensor and 2 minus at the ankle dorsiflexor plantar  flexor Sensation is reduced in the right foot to light touch however left foot was not tested due to recent surgery Left transmetatarsal amputation site nontender to palpation Patient is comfortable and expressing no outward signs of pain  Results for orders placed during the hospital encounter of 08/26/11 (from the past 24 hour(s))  CBC     Status: Abnormal   Collection Time   09/14/11 11:00 AM      Component Value Range   WBC 10.1  4.0 - 10.5 K/uL   RBC 2.98 (*) 4.22 - 5.81 MIL/uL   Hemoglobin 8.5 (*) 13.0 - 17.0 g/dL   HCT 19.1 (*) 47.8 - 29.5 %   MCV 89.6  78.0 - 100.0 fL   MCH 28.5  26.0 - 34.0 pg   MCHC 31.8  30.0 - 36.0 g/dL   RDW 62.1 (*) 30.8 - 65.7 %   Platelets 192  150 - 400 K/uL  CBC     Status: Abnormal   Collection Time   09/15/11  6:57  AM      Component Value Range   WBC 8.4  4.0 - 10.5 K/uL   RBC 2.83 (*) 4.22 - 5.81 MIL/uL   Hemoglobin 8.0 (*) 13.0 - 17.0 g/dL   HCT 84.6 (*) 96.2 - 95.2 %   MCV 88.7  78.0 - 100.0 fL   MCH 28.3  26.0 - 34.0 pg   MCHC 31.9  30.0 - 36.0 g/dL   RDW 84.1 (*) 32.4 - 40.1 %   Platelets 179  150 - 400 K/uL   No results found.  Assessment/Plan: Diagnosis: left transmetatarsal" due to peripheral vascular disease 1. Does the need for close, 24 hr/day medical supervision in concert with the patient's rehab needs make it unreasonable for this patient to be served in a less intensive setting? Yes  2. Co-Morbidities requiring supervision/potential complications: chronic kidney disease on dialysis, hypertension, C. Difficile, atrial fibrillation 3. Due to bowel management, safety, skin/wound care, disease management, medication administration, pain management and patient education, does the patient require 24 hr/day rehab nursing? Yes 4. Does the patient require coordinated care of a physician, rehab nurse, PT (1-2 hrs/day, 5 days/week) and OT (1-2 hrs/day, 5 days/week) to address physical and functional deficits in the context of the above medical diagnosis(es)? Yes Addressing deficits in the following areas: balance, endurance, locomotion, strength, transferring, bathing, dressing and toileting 5. Can the patient actively participate in an intensive therapy program of at least 3 hrs of therapy per day at least 5 days per week? Yes 6. The potential for patient to make measurable gains while on inpatient rehab is good 7. Anticipated functional outcomes upon discharge from inpatient rehab are supervision mobility with PT, supervision ADLs with OT, not applicable with SLP. 8. Estimated rehab length of stay to reach the above functional goals is: 10-12 days 9. Does the patient have adequate social supports to accommodate these discharge functional goals? Yes 10. Anticipated D/C setting:  Home 11. Anticipated post D/C treatments: HH therapy 12. Overall Rehab/Functional Prognosis: good  RECOMMENDATIONS: This patient's condition is appropriate for continued rehabilitative care in the following setting: CIR Patient has agreed to participate in recommended program. Yes Note that insurance prior authorization may be required for reimbursement for recommended care.  Comment:    09/15/2011

## 2011-09-15 NOTE — Progress Notes (Signed)
Hydrotherapy Note   09/15/11 1400  Subjective Assessment  Subjective Dialysis wore me out today.    Evaluation and Treatment  Evaluation and Treatment Procedures Explained to Patient/Family Yes  Evaluation and Treatment Procedures agreed to  Pressure Ulcer 08/27/11 Stage II -  Partial thickness loss of dermis presenting as a shallow open ulcer with a red, pink wound bed without slough.  Date First Assessed/Time First Assessed: 08/27/11 0800   Location: Sacrum  Staging: Stage II -  Partial thickness loss of dermis presenting as a shallow open ulcer with a red, pink wound bed without slough.  State of Healing Early/partial granulation  Site / Wound Assessment Black;Pink;Yellow  % Wound base Red or Granulating 30%  % Wound base Yellow 60%  % Wound base Black 10%  % Wound base Other (Comment) 0%  Drainage Amount Moderate  Drainage Description Purulent  Treatment Hydrotherapy (Pulse lavage);Debridement (Selective)  Dressing Type ABD;Gauze (Comment);Tape dressing;Barrier Film (skin prep);Moist to dry (Santyl)  Dressing Changed  Hydrotherapy  Pulsed Lavage with Suction (psi) 8 psi  Pulsed Lavage with Suction - Normal Saline Used 1000 mL  Pulsed Lavage Tip Tip with splash shield  Pulsed lavage therapy - wound location Sacrum  Selective Debridement  Selective Debridement - Location sacrum  Selective Debridement - Tools Used Forceps;Scissors  Selective Debridement - Tissue Removed yellow slough  Wound Therapy - Assess/Plan/Recommendations  Wound Therapy - Clinical Statement Slow improvement continues.  Wound Therapy - Functional Problem List Decreased mobility  Factors Delaying/Impairing Wound Healing Immobility;Multiple medical problems;Polypharmacy  Hydrotherapy Plan Debridement;Dressing change;Patient/family education;Pulsatile lavage with suction  Wound Therapy - Frequency 6X / week  Wound Therapy - Follow Up Recommendations Home health RN  Wound Therapy Goals - Improve the function  of patient's integumentary system by progressing the wound(s) through the phases of wound healing by:  Decrease Necrotic Tissue - Progress Progressing toward goal  Increase Granulation Tissue - Progress Progressing toward goal    Mack Hook, PT [phone]

## 2011-09-15 NOTE — Progress Notes (Addendum)
[facility] [place]  Progress Note Bypass Surgery  Date of Surgery: 08/26/2011 - 09/12/2011  Procedure(s): left  BYPASS GRAFT FEMORAL-POPLITEAL ARTERY  Left trans-metatarsal amputation  Surgeon: Surgeon(s): [name], MD  3 Days Post-Op  History of Present Illness  [name] is a 61 y.o. male who is awaiting rehab consult. He C/O incisional pain and pain in TMA. States not well controlled at this time.  Significant Diagnostic Studies: CBC Lab Results  Component Value Date   WBC 8.4 09/15/2011   HGB 8.0* 09/15/2011   HCT 25.1* 09/15/2011   MCV 88.7 09/15/2011   PLT 179 09/15/2011    BMET     Component Value Date/Time   NA 138 09/15/2011 0657   K 3.9 09/15/2011 0657   CL 100 09/15/2011 0657   CO2 27 09/15/2011 0657   GLUCOSE 81 09/15/2011 0657   BUN 21 09/15/2011 0657   CREATININE 5.69* 09/15/2011 0657   CALCIUM 8.1* 09/15/2011 0657   GFRNONAA 10* 09/15/2011 0657   GFRAA 11* 09/15/2011 0657    COAG Lab Results  Component Value Date   INR 1.30 09/12/2011   INR 1.38 08/27/2011   INR 1.54* 08/19/2011   No results found for this basename: PTT    Physical Examination  BP Readings from Last 3 Encounters:  09/15/11 160/69  09/15/11 160/69  09/15/11 160/69   Temp Readings from Last 3 Encounters:  09/15/11 98.4 F (36.9 C) Oral  09/15/11 98.4 F (36.9 C) Oral  09/15/11 98.4 F (36.9 C) Oral   SpO2 Readings from Last 3 Encounters:  09/15/11 93%  09/15/11 93%  09/15/11 93%   Pulse Readings from Last 3 Encounters:  09/15/11 80  09/15/11 80  09/15/11 80    Pt is A&O x 3 right lower extremity: Incision/s is/are clean,dry.intact, and  healing without hematoma, erythema or drainage Limb is warm; with good color  Assessment/Plan: Pt. Doing well Post-op pain is not well controlled Pt with HX of drug abuse in past Wounds are healing well PT/OT for ambulation Continue wound care as ordered  [place] [phone] 09/15/2011 3:03 PM    Agree with above.  Hopefully home soon once pain better controlled  Wells Reha Martinovich

## 2011-09-15 NOTE — Progress Notes (Signed)
[facility]  On HD via L IJ tunneled cath BP--155/72 Goal--3.8 L Hgb--pending   K+--pending  3 lumen cath still in R femoral vein-not getting IV meds.  Cath should be d/c'd (in my opinion)  He says he lives in [place] and gets dialysis in [place].

## 2011-09-16 ENCOUNTER — Encounter (HOSPITAL_COMMUNITY): Payer: Self-pay | Admitting: *Deleted

## 2011-09-16 ENCOUNTER — Inpatient Hospital Stay (HOSPITAL_COMMUNITY)
Admission: RE | Admit: 2011-09-16 | Discharge: 2011-09-26 | DRG: 945 | Disposition: A | Discharge status: Home Health | Payer: Medicare Other | Source: Ambulatory Visit | Attending: Physical Medicine & Rehabilitation | Admitting: Physical Medicine & Rehabilitation

## 2011-09-16 DIAGNOSIS — I70219 Atherosclerosis of native arteries of extremities with intermittent claudication, unspecified extremity: Secondary | ICD-10-CM

## 2011-09-16 DIAGNOSIS — E1165 Type 2 diabetes mellitus with hyperglycemia: Secondary | ICD-10-CM

## 2011-09-16 DIAGNOSIS — Z87891 Personal history of nicotine dependence: Secondary | ICD-10-CM

## 2011-09-16 DIAGNOSIS — N2581 Secondary hyperparathyroidism of renal origin: Secondary | ICD-10-CM

## 2011-09-16 DIAGNOSIS — L98499 Non-pressure chronic ulcer of skin of other sites with unspecified severity: Secondary | ICD-10-CM

## 2011-09-16 DIAGNOSIS — I120 Hypertensive chronic kidney disease with stage 5 chronic kidney disease or end stage renal disease: Secondary | ICD-10-CM | POA: Diagnosis present

## 2011-09-16 DIAGNOSIS — M129 Arthropathy, unspecified: Secondary | ICD-10-CM | POA: Diagnosis present

## 2011-09-16 DIAGNOSIS — E785 Hyperlipidemia, unspecified: Secondary | ICD-10-CM | POA: Diagnosis present

## 2011-09-16 DIAGNOSIS — I509 Heart failure, unspecified: Secondary | ICD-10-CM | POA: Diagnosis present

## 2011-09-16 DIAGNOSIS — S98919A Complete traumatic amputation of unspecified foot, level unspecified, initial encounter: Secondary | ICD-10-CM

## 2011-09-16 DIAGNOSIS — Z5189 Encounter for other specified aftercare: Secondary | ICD-10-CM

## 2011-09-16 DIAGNOSIS — A0472 Enterocolitis due to Clostridium difficile, not specified as recurrent: Secondary | ICD-10-CM | POA: Diagnosis present

## 2011-09-16 DIAGNOSIS — N186 End stage renal disease: Secondary | ICD-10-CM

## 2011-09-16 DIAGNOSIS — A419 Sepsis, unspecified organism: Secondary | ICD-10-CM | POA: Diagnosis present

## 2011-09-16 DIAGNOSIS — R5381 Other malaise: Secondary | ICD-10-CM | POA: Diagnosis present

## 2011-09-16 DIAGNOSIS — Z992 Dependence on renal dialysis: Secondary | ICD-10-CM

## 2011-09-16 DIAGNOSIS — I739 Peripheral vascular disease, unspecified: Secondary | ICD-10-CM

## 2011-09-16 DIAGNOSIS — IMO0001 Reserved for inherently not codable concepts without codable children: Secondary | ICD-10-CM

## 2011-09-16 DIAGNOSIS — S88919A Complete traumatic amputation of unspecified lower leg, level unspecified, initial encounter: Secondary | ICD-10-CM

## 2011-09-16 DIAGNOSIS — I5032 Chronic diastolic (congestive) heart failure: Secondary | ICD-10-CM | POA: Diagnosis present

## 2011-09-16 DIAGNOSIS — D649 Anemia, unspecified: Secondary | ICD-10-CM | POA: Diagnosis present

## 2011-09-16 DIAGNOSIS — I70269 Atherosclerosis of native arteries of extremities with gangrene, unspecified extremity: Secondary | ICD-10-CM

## 2011-09-16 DIAGNOSIS — J189 Pneumonia, unspecified organism: Secondary | ICD-10-CM | POA: Diagnosis present

## 2011-09-16 DIAGNOSIS — R0602 Shortness of breath: Secondary | ICD-10-CM | POA: Diagnosis present

## 2011-09-16 LAB — CBC
MCH: 29.2 pg (ref 26.0–34.0)
MCV: 90.3 fL (ref 78.0–100.0)
RBC: 2.98 MIL/uL — ABNORMAL LOW (ref 4.22–5.81)

## 2011-09-16 LAB — IRON AND TIBC
Saturation Ratios: 30 % (ref 20–55)
TIBC: 63 ug/dL — ABNORMAL LOW (ref 215–435)

## 2011-09-16 LAB — FERRITIN: Ferritin: 2323 ng/mL — ABNORMAL HIGH (ref 22–322)

## 2011-09-16 MED ORDER — RENA-VITE PO TABS
1.0000 | ORAL_TABLET | Freq: Every day | ORAL | Status: DC
  Administered 2011-09-16 – 2011-09-25 (×10): 1 via ORAL
  Filled 2011-09-16 (×13): qty 1

## 2011-09-16 MED ORDER — GLYCERIN (LAXATIVE) 2.1 G RE SUPP
1.0000 | RECTAL | Status: DC | PRN
  Filled 2011-09-16: qty 1

## 2011-09-16 MED ORDER — SORBITOL 70 % SOLN
30.0000 mL | Freq: Every day | Status: DC | PRN
  Administered 2011-09-18: 30 mL via ORAL
  Filled 2011-09-16 (×3): qty 30

## 2011-09-16 MED ORDER — HYDRALAZINE HCL 20 MG/ML IJ SOLN: 10.0000 mg | INTRAMUSCULAR | Status: DC | PRN

## 2011-09-16 MED ORDER — COLLAGENASE 250 UNIT/GM EX OINT
TOPICAL_OINTMENT | Freq: Every day | CUTANEOUS | Status: DC
  Administered 2011-09-17 – 2011-09-22 (×5): via TOPICAL
  Administered 2011-09-23: 1 via TOPICAL
  Administered 2011-09-25 – 2011-09-26 (×2): via TOPICAL
  Filled 2011-09-16: qty 30

## 2011-09-16 MED ORDER — METOPROLOL TARTRATE 100 MG PO TABS
100.0000 mg | ORAL_TABLET | Freq: Two times a day (BID) | ORAL | Status: DC
  Administered 2011-09-16 – 2011-09-25 (×18): 100 mg via ORAL
  Filled 2011-09-16 (×25): qty 1

## 2011-09-16 MED ORDER — PHENOL 1.4 % MT LIQD
1.0000 | OROMUCOSAL | Status: DC | PRN
  Filled 2011-09-16: qty 177

## 2011-09-16 MED ORDER — ONDANSETRON HCL 4 MG/2ML IJ SOLN: 4.0000 mg | Freq: Four times a day (QID) | INTRAMUSCULAR | Status: DC | PRN

## 2011-09-16 MED ORDER — GABAPENTIN 100 MG PO CAPS: 200.0000 mg | ORAL_CAPSULE | ORAL | Status: DC

## 2011-09-16 MED ORDER — NEPRO/CARBSTEADY PO LIQD
237.0000 mL | Freq: Two times a day (BID) | ORAL | Status: DC
  Administered 2011-09-16 – 2011-09-25 (×11): 237 mL via ORAL

## 2011-09-16 MED ORDER — DARBEPOETIN ALFA-POLYSORBATE 100 MCG/0.5ML IJ SOLN: 100.0000 ug | INTRAMUSCULAR | Status: DC

## 2011-09-16 MED ORDER — NEPRO/CARBSTEADY PO LIQD: 237.0000 mL | Freq: Two times a day (BID) | ORAL | Status: DC

## 2011-09-16 MED ORDER — AMLODIPINE BESYLATE 10 MG PO TABS
10.0000 mg | ORAL_TABLET | Freq: Every day | ORAL | Status: DC
  Administered 2011-09-17 – 2011-09-25 (×9): 10 mg via ORAL
  Filled 2011-09-16 (×12): qty 1

## 2011-09-16 MED ORDER — SIMVASTATIN 5 MG PO TABS
5.0000 mg | ORAL_TABLET | Freq: Every day | ORAL | Status: DC
  Administered 2011-09-16 – 2011-09-25 (×10): 5 mg via ORAL
  Filled 2011-09-16 (×11): qty 1

## 2011-09-16 MED ORDER — GLYCERIN (LAXATIVE) 2.1 G RE SUPP: 1.0000 | RECTAL | Status: DC | PRN

## 2011-09-16 MED ORDER — BISACODYL 5 MG PO TBEC
5.0000 mg | DELAYED_RELEASE_TABLET | Freq: Every day | ORAL | Status: DC | PRN
  Administered 2011-09-21 – 2011-09-23 (×2): 5 mg via ORAL
  Filled 2011-09-16 (×3): qty 1

## 2011-09-16 MED ORDER — CHLORHEXIDINE GLUCONATE 0.12 % MT SOLN
15.0000 mL | Freq: Two times a day (BID) | OROMUCOSAL | Status: DC
  Administered 2011-09-16 – 2011-09-25 (×14): 15 mL via OROMUCOSAL
  Filled 2011-09-16 (×22): qty 15

## 2011-09-16 MED ORDER — SODIUM CHLORIDE 0.9 % IJ SOLN: 10.0000 mL | INTRAMUSCULAR | Status: DC | PRN

## 2011-09-16 MED ORDER — BISACODYL 5 MG PO TBEC: 5.0000 mg | DELAYED_RELEASE_TABLET | Freq: Every day | ORAL | Status: DC | PRN

## 2011-09-16 MED ORDER — DARBEPOETIN ALFA-POLYSORBATE 100 MCG/0.5ML IJ SOLN
100.0000 ug | INTRAMUSCULAR | Status: DC
  Administered 2011-09-17: 100 ug via INTRAVENOUS
  Filled 2011-09-16: qty 0.5

## 2011-09-16 MED ORDER — CINACALCET HCL 30 MG PO TABS
60.0000 mg | ORAL_TABLET | Freq: Every day | ORAL | Status: DC
  Administered 2011-09-17 – 2011-09-25 (×9): 60 mg via ORAL
  Filled 2011-09-16 (×13): qty 2

## 2011-09-16 MED ORDER — DSS 100 MG PO CAPS: 100.0000 mg | ORAL_CAPSULE | Freq: Every day | ORAL | Status: AC

## 2011-09-16 MED ORDER — CINACALCET HCL 30 MG PO TABS: 60.0000 mg | ORAL_TABLET | Freq: Every day | ORAL | Status: AC

## 2011-09-16 MED ORDER — HEPARIN SODIUM (PORCINE) 1000 UNIT/ML DIALYSIS
20.0000 [IU]/kg | INTRAMUSCULAR | Status: DC | PRN
  Filled 2011-09-16: qty 2

## 2011-09-16 MED ORDER — GABAPENTIN 100 MG PO CAPS: 100.0000 mg | ORAL_CAPSULE | Freq: Three times a day (TID) | ORAL | Status: DC

## 2011-09-16 MED ORDER — SACCHAROMYCES BOULARDII 250 MG PO CAPS
250.0000 mg | ORAL_CAPSULE | Freq: Two times a day (BID) | ORAL | Status: DC
  Administered 2011-09-16 – 2011-09-25 (×19): 250 mg via ORAL
  Filled 2011-09-16 (×25): qty 1

## 2011-09-16 MED ORDER — HYDRALAZINE HCL 20 MG/ML IJ SOLN
10.0000 mg | INTRAMUSCULAR | Status: DC | PRN
  Filled 2011-09-16: qty 0.5

## 2011-09-16 MED ORDER — HYDROCODONE-ACETAMINOPHEN 5-325 MG PO TABS
1.0000 | ORAL_TABLET | ORAL | Status: DC | PRN
  Administered 2011-09-16 – 2011-09-17 (×6): 2 via ORAL
  Administered 2011-09-18: 1 via ORAL
  Administered 2011-09-18 – 2011-09-19 (×5): 2 via ORAL
  Filled 2011-09-16 (×11): qty 2

## 2011-09-16 MED ORDER — SODIUM CHLORIDE 0.9 % IJ SOLN
10.0000 mL | Freq: Two times a day (BID) | INTRAMUSCULAR | Status: DC
  Administered 2011-09-18: 12 mL
  Administered 2011-09-20: 10 mL

## 2011-09-16 MED ORDER — SENNOSIDES-DOCUSATE SODIUM 8.6-50 MG PO TABS
1.0000 | ORAL_TABLET | Freq: Every evening | ORAL | Status: DC | PRN
  Filled 2011-09-16: qty 1

## 2011-09-16 MED ORDER — BIOTENE DRY MOUTH MT LIQD: 15.0000 mL | Freq: Two times a day (BID) | OROMUCOSAL | Status: DC

## 2011-09-16 MED ORDER — ONDANSETRON HCL 4 MG PO TABS
4.0000 mg | ORAL_TABLET | Freq: Four times a day (QID) | ORAL | Status: DC | PRN
  Administered 2011-09-22 – 2011-09-24 (×2): 4 mg via ORAL
  Filled 2011-09-16 (×2): qty 1

## 2011-09-16 MED ORDER — GABAPENTIN 100 MG PO CAPS
200.0000 mg | ORAL_CAPSULE | ORAL | Status: DC
  Administered 2011-09-17 – 2011-09-19 (×2): 200 mg via ORAL
  Filled 2011-09-16 (×2): qty 2

## 2011-09-16 MED ORDER — BIOTENE DRY MOUTH MT LIQD
15.0000 mL | Freq: Two times a day (BID) | OROMUCOSAL | Status: DC
  Administered 2011-09-16 – 2011-09-25 (×6): 15 mL via OROMUCOSAL

## 2011-09-16 MED ORDER — DOXERCALCIFEROL 4 MCG/2ML IV SOLN
6.0000 ug | INTRAVENOUS | Status: DC
  Administered 2011-09-17 – 2011-09-24 (×4): 6 ug via INTRAVENOUS
  Filled 2011-09-16 (×5): qty 4

## 2011-09-16 MED ORDER — GABAPENTIN 100 MG PO CAPS
100.0000 mg | ORAL_CAPSULE | ORAL | Status: DC
  Administered 2011-09-16 – 2011-09-18 (×2): 100 mg via ORAL
  Filled 2011-09-16 (×3): qty 1

## 2011-09-16 MED ORDER — DOXERCALCIFEROL 4 MCG/2ML IV SOLN: 6.0000 ug | INTRAVENOUS | Status: DC

## 2011-09-16 MED ORDER — ACETAMINOPHEN 325 MG PO TABS
325.0000 mg | ORAL_TABLET | ORAL | Status: DC | PRN
  Administered 2011-09-17 – 2011-09-25 (×4): 650 mg via ORAL
  Filled 2011-09-16 (×4): qty 2

## 2011-09-16 MED ORDER — ACETAMINOPHEN 325 MG PO TABS: 325.0000 mg | ORAL_TABLET | ORAL | Status: DC | PRN

## 2011-09-16 MED ORDER — DOCUSATE SODIUM 100 MG PO CAPS
100.0000 mg | ORAL_CAPSULE | Freq: Every day | ORAL | Status: DC
  Administered 2011-09-17 – 2011-09-25 (×9): 100 mg via ORAL
  Filled 2011-09-16 (×18): qty 1

## 2011-09-16 MED ORDER — COLLAGENASE 250 UNIT/GM EX OINT: TOPICAL_OINTMENT | Freq: Every day | CUTANEOUS | Status: DC

## 2011-09-16 MED ORDER — HEPARIN SODIUM (PORCINE) 1000 UNIT/ML DIALYSIS
2000.0000 [IU] | INTRAMUSCULAR | Status: DC | PRN
  Administered 2011-09-17: 2000 [IU] via INTRAVENOUS_CENTRAL
  Filled 2011-09-16: qty 2

## 2011-09-16 MED ORDER — LABETALOL HCL 5 MG/ML IV SOLN
10.0000 mg | INTRAVENOUS | Status: DC | PRN
  Filled 2011-09-16: qty 4

## 2011-09-16 MED ORDER — METOPROLOL TARTRATE 1 MG/ML IV SOLN
2.0000 mg | INTRAVENOUS | Status: DC | PRN
  Filled 2011-09-16: qty 5

## 2011-09-16 MED ORDER — ACETAMINOPHEN 650 MG RE SUPP: 325.0000 mg | RECTAL | Status: DC | PRN

## 2011-09-16 MED ORDER — ISOSORBIDE MONONITRATE ER 30 MG PO TB24
30.0000 mg | ORAL_TABLET | Freq: Every day | ORAL | Status: DC
  Administered 2011-09-17 – 2011-09-25 (×9): 30 mg via ORAL
  Filled 2011-09-16 (×13): qty 1

## 2011-09-16 MED ORDER — HYDROCODONE-ACETAMINOPHEN 5-325 MG PO TABS: 1.0000 | ORAL_TABLET | ORAL | Status: DC | PRN

## 2011-09-16 NOTE — Discharge Summary (Signed)
[facility] Discharge Summary   Patient ID:  [name] MRN: [id number] DOB/AGE: [birth year] 61 y.o.  Admit date: 08/26/2011 Discharge date: 09/16/2011 Date of Surgery: 08/26/2011 - 09/12/2011 Surgeon: Moishe Spice): [name], MD  Admission Diagnosis: Septic shock [785.52] End stage renal disease [585.6] SBO (small bowel obstruction) [560.9] Altered mental status [780.97] Respiratory distress [786.09] Acute respiratory failure with hypoxia [518.81] Acute encephalopathy [348.30] C. difficile colitis [008.45] Hypotension [458.9] sob pvd PVD WITH GANGRENE  Discharge Diagnoses:  Septic shock [785.52] End stage renal disease [585.6] SBO (small bowel obstruction) [560.9] Altered mental status [780.97] Respiratory distress [786.09] Acute respiratory failure with hypoxia [518.81] Acute encephalopathy [348.30] C. difficile colitis [008.45] Hypotension [458.9] sob pvd PVD WITH GANGRENE  Secondary Diagnoses: Past Medical History  Diagnosis Date  . Hyperlipidemia   . ESRD (end stage renal disease)     a. MWF dialysis in [place] (followed by Dr. Kristian Covey)  . Arthritis   . Claudication   . Stroke 2005  . Anemia   . Chronic diastolic CHF (congestive heart failure) 07/2010    a. 07/2008 Echo EF 50-55%, mild-mod LVH, Gr 1 DD.  Marland Kitchen Peripheral vascular occlusive disease     a. 07/2011 Periph Angio: No signif Ao-illiac dzs, LCF stenosis, 100% LSFA w recon above knee pop and 3 vessel runoff, 100% RSFA w/ recon above knee --> pending L Fem to below Knee pop bypass.  Marland Kitchen History of tobacco abuse   . Hypertension     takes Amlodipine,Metoprolol,and Prinivil daily  . Dyspnea on exertion     with exertion    Procedure(s): AMPUTATION BELOW KNEE  Discharged Condition: good  HPI: This is a visit for this patient. He has previously been seen in her office for end-stage renal disease. He was seen by Dr. Hart Rochester in 2009 vascular disease but has not had this problem  addressed since then. Approximately 3-4 weeks ago he had a ingrown toenail removed. This has not healed. This was his left great toe. He has been treated with antibiotics. The patient is on dialysis and dialyzes on Monday Wednesday and Friday he comes with vascular lab studies which showed calcified vessels and toe pressure of 26 on the left. He is medically managed for his hypertension and hyperlipidemia.   The patients bypass has been delayed secondary to sepsis form C Diff which has now resolved. The patient has had progression of the ischemia on his left foot, now involving all of his toes. I discussed proceeding with left femoral popliteal bypass tomorrow and likely a TMA later in the week. We discussed the risks and benefits, including the risk of wound complications, infection, cardiopulmonary complications. He is eager to proceed.    09-12-2011 Bo Mcclintock has presented today for surgery, with the diagnosis of PVD WITH GANGRENE The various methods of treatment have been discussed with the patient and family. After consideration of risks, benefits and other options for treatment, the patient has consented to Procedure(s) (LRB):  Trans-metatarsal amputation (Left) as a surgical intervention . The patient's history has been reviewed, patient examined, no change in status, stable for surgery. I have reviewed the patients' chart and labs. Questions were answered to the patient's satisfaction.       Hospital Course:  [name] is a 61 y.o. male is S/P Left Procedure(s): AMPUTATION BELOW KNEE Extubated: POD # 0 Post-op wounds clean, dry, intact or healing well Pt. Ambulating, voiding and taking PO diet without difficulty. Pt pain controlled with PO  pain meds. Labs as below Complications:none  Consults:  Treatment Team:  Dyke Maes, MD [name], MD  Significant Diagnostic Studies: CBC Lab Results  Component Value Date   WBC 8.1 09/16/2011   HGB 8.7* 09/16/2011    HCT 26.9* 09/16/2011   MCV 90.3 09/16/2011   PLT 185 09/16/2011    BMET    Component Value Date/Time   NA 138 09/15/2011 0657   K 3.9 09/15/2011 0657   CL 100 09/15/2011 0657   CO2 27 09/15/2011 0657   GLUCOSE 81 09/15/2011 0657   BUN 21 09/15/2011 0657   CREATININE 5.69* 09/15/2011 0657   CALCIUM 8.1* 09/15/2011 0657   GFRNONAA 10* 09/15/2011 0657   GFRAA 11* 09/15/2011 0657   COAG Lab Results  Component Value Date   INR 1.30 09/12/2011   INR 1.38 08/27/2011   INR 1.54* 08/19/2011     Disposition:  Discharge to :Rehab Discharge Orders    Future Orders Please Complete By Expires   Resume previous diet      Call MD for:  temperature >100.5      Call MD for:  redness, tenderness, or signs of infection (pain, swelling, bleeding, redness, odor or green/yellow discharge around incision site)      Call MD for:  severe or increased pain, loss or decreased feeling  in affected limb(s)      Increase activity slowly      Comments:   Walk with assistance use walker or cane as needed   May shower          Karsen, Nakanishi  Home Medication Instructions RUE:[id number]   Printed on:09/16/11 0827  Medication Information                    amLODipine (NORVASC) 10 MG tablet Take 10 mg by mouth daily.           gabapentin (NEURONTIN) 100 MG capsule Take 100 mg by mouth 2 (two) times daily.           calcium acetate (PHOSLO) 667 MG capsule Take 667-1,334 mg by mouth 3 (three) times daily with meals. 2 caps with each meal, 1 cap with snacks           multivitamin (RENA-VIT) TABS tablet Take 1 tablet by mouth daily.           lisinopril (PRINIVIL,ZESTRIL) 30 MG tablet Take 30 mg by mouth daily.           metoprolol succinate (TOPROL-XL) 100 MG 24 hr tablet Take 100 mg by mouth 2 (two) times daily. Take with or immediately following a meal.           cinacalcet (SENSIPAR) 30 MG tablet Take 30 mg by mouth at bedtime.           sevelamer (RENVELA) 800 MG tablet Take 800-2,400 mg by  mouth 3 (three) times daily with meals. Take 4 tabs with meals and 1 tab with snacks           aspirin EC 81 MG tablet Take 1 tablet (81 mg total) by mouth daily.           lanthanum (FOSRENOL) 1000 MG chewable tablet Chew 1,000-2,000 mg by mouth 3 (three) times daily with meals. 2 tabs with each meal, 1 tab with snacks           isosorbide mononitrate (IMDUR) 30 MG 24 hr tablet Take 30 mg by mouth daily.  oxyCODONE (OXY IR/ROXICODONE) 5 MG immediate release tablet Take 1-2 tablets (5-10 mg total) by mouth every 6 (six) hours as needed for pain.           calcitRIOL (ROCALTROL) 0.25 MCG capsule Take 0.25 mcg by mouth daily.           simvastatin (ZOCOR) 5 MG tablet Take 5 mg by mouth at bedtime.           antiseptic oral rinse (BIOTENE) LIQD 15 mLs by Mouth Rinse route 2 times daily at 12 noon and 4 pm.           bisacodyl (DULCOLAX) 5 MG EC tablet Take 1 tablet (5 mg total) by mouth daily as needed.           cinacalcet (SENSIPAR) 30 MG tablet Take 2 tablets (60 mg total) by mouth daily with breakfast.           collagenase (SANTYL) ointment Apply topically daily.           darbepoetin (ARANESP) 100 MCG/0.5ML SOLN Inject 0.5 mLs (100 mcg total) into the vein every Wednesday with hemodialysis.           docusate sodium 100 MG CAPS Take 100 mg by mouth daily.           doxercalciferol (HECTOROL) 4 MCG/2ML injection Inject 3 mLs (6 mcg total) into the vein every Monday, Wednesday, and Friday with hemodialysis.           Nutritional Supplements (FEEDING SUPPLEMENT, NEPRO CARB STEADY,) LIQD Take 237 mLs by mouth 2 (two) times daily between meals.           gabapentin (NEURONTIN) 100 MG capsule Take 1 capsule (100 mg total) by mouth 3 (three) times daily.           gabapentin (NEURONTIN) 100 MG capsule Take 2 capsules (200 mg total) by mouth every Monday, Wednesday, and Friday at 8 PM.           Glycerin, Adult, 2.1 G SUPP Place 1 suppository rectally as  needed.           hydrALAZINE (APRESOLINE) 20 MG/ML injection Inject 0.5 mLs (10 mg total) into the vein every 2 (two) hours as needed (high blood pressure).           HYDROcodone-acetaminophen (NORCO) 5-325 MG per tablet Take 1-2 tablets by mouth every 4 (four) hours as needed.            Verbal and written Discharge instructions given to the patient. Wound care per Discharge AVS F/U with Dr. Myra Gianotti 4 weeks post surgery.  SignedMosetta Pigeon 09/16/2011, 8:27 AM

## 2011-09-16 NOTE — Progress Notes (Signed)
MEDICATION RELATED CONSULT NOTE - FOLLOW UP   Pharmacy Consult:  Weaning pain meds  No Known Allergies  Patient Measurements: Height: 5\' 6"  (167.6 cm) Weight: 142 lb 3.2 oz (64.5 kg) IBW/kg (Calculated) : 63.8   Vital Signs: Temp: 98.1 F (36.7 C) (06/25 1300) Temp src: Oral (06/25 1300) BP: 114/68 mmHg (06/25 1300) Pulse Rate: 68  (06/25 1300)  Labs:  Basename 09/16/11 0510 09/15/11 0657 09/14/11 1100  WBC 8.1 8.4 10.1  HGB 8.7* 8.0* 8.5*  HCT 26.9* 25.1* 26.7*  PLT 185 179 192  APTT -- -- --  CREATININE -- 5.69* --  LABCREA -- -- --  CREATININE -- 5.69* --  CREAT24HRUR -- -- --  MG -- -- --  PHOS -- 4.5 --  ALBUMIN -- 1.5* --  PROT -- -- --  ALBUMIN -- 1.5* --  AST -- -- --  ALT -- -- --  ALKPHOS -- -- --  BILITOT -- -- --  BILIDIR -- -- --  IBILI -- -- --   Estimated Creatinine Clearance: 12.1 ml/min (by C-G formula based on Cr of 5.69).     Assessment: 61 y.o. M with ESRD and history of drug abuse started on pain medications for pain-management s/p left trans-metatarsal amputation on 09/12/11. Pharmacy was consulted to help with weaning off his pain medications.   Patient reported he has been on "all sorts" of pain medications in the past. Most recently, prior to admission, he was taking OxyIR 5-10 mg every 6 hours as needed. The patient describes his pain as "burning and stabbing" and persistent. He states that when he realizes that he isn't hurting -- that's when it starts again.   The patient's pain regimen has been weaned significantly over the course of the past couple of days. Morphine & OxyIR were d/c'ed on 6/22, dilaudid IV was d/c'ed on 6/24.  His pain may be neuropathic in nature, so gabapentin was added 09/15/11 and Percocet was changed to Norco this AM 09/16/11. Eventually, if gabapentin appears to help (the dose can be titrated up) -- we can consider switching from norco to tramadol as this would be less prone to exacerbate his history of drug  abuse.   As of right now, patient is pain free.  He did report one episode of pain this morning that woke him up and Norco was able to control it.  Will allow time for gabapentin to take its full effect.     Goal of Therapy:  Adequate pain control while minimizing pain medications   Plan:  - Continue gabapentin 100 mg at bedtime on T/Th/Sat/Sun and 200 mg at bedtime on M/W/F - Continue Norco as needed for breakthrough pain - Will continue to follow pain management and adjust meds as needed    Domnic Vantol D. Laney Potash, PharmD, BCPS Pager:  [phone] 09/16/2011, 3:22 PM

## 2011-09-16 NOTE — H&P (Signed)
Physical Medicine and Rehabilitation Admission H&P  Chief Complaint   Patient presents with   .  Shortness of Breath   :  HPI: [name] is a 61 y.o. right-handed male with history of peripheral vascular disease as well end stage renal disease with hemodialysis. Recently hospitalized for severe sepsis due to C. difficile complicated by respiratory failure and sepsis. He was recently discharged home 08/23/2011. Presented with ischemic left foot on 09/08/2011. He has been followed by vascular surgery with plan for femoropopliteal bypass but delayed due to recent C. difficile. Underwent femoropopliteal bypass 09/09/2011 per Dr. Myra Gianotti. Ongoing ischemic changes to left lower extremity and ultimately underwent left transmetatarsal amputation 09/12/2011 per Dr. Myra Gianotti. Postoperative pain management as well as anemia 7.8. Hemodialysis ongoing as per renal services. Physical therapy evaluation completed 09/13/2011. M.D. has requested physical medicine rehabilitation consult to consider inpatient rehabilitation services. Patient was felt to be due to candidate for inpatient rehabilitation services and was admitted for comprehensive rehabilitation program  Review of Systems  Constitutional: Positive for malaise/fatigue.  Cardiovascular: Positive for leg swelling.  All other systems reviewed and are negative  Past Medical History   Diagnosis  Date   .  Hyperlipidemia    .  ESRD (end stage renal disease)      a. MWF dialysis in [place] (followed by Dr. Kristian Covey)   .  Arthritis    .  Claudication    .  Stroke  2005   .  Anemia    .  Chronic diastolic CHF (congestive heart failure)  07/2010     a. 07/2008 Echo EF 50-55%, mild-mod LVH, Gr 1 DD.   Marland Kitchen  Peripheral vascular occlusive disease      a. 07/2011 Periph Angio: No signif Ao-illiac dzs, LCF stenosis, 100% LSFA w recon above knee pop and 3 vessel runoff, 100% RSFA w/ recon above knee --> pending L Fem to below Knee pop bypass.   Marland Kitchen  History of  tobacco abuse    .  Hypertension      takes Amlodipine,Metoprolol,and Prinivil daily   .  Dyspnea on exertion      with exertion    Past Surgical History   Procedure  Date   .  Arteriovenous graft placement    .  Av fistula placement  12/10/2000     Right brachiocephalic arteriovenous   .  Hernia repair    .  Aortagram  07/29/2011     Abdominal Aortagram   .  Cardiac catheterization  08/01/11     Left heart catheterization   .  Femoral-popliteal bypass graft  09/09/2011     Procedure: BYPASS GRAFT FEMORAL-POPLITEAL ARTERY; Surgeon: [name], MD; Location: [facility] OR; Service: Vascular; Laterality: Left;    Family History   Problem  Relation  Age of Onset   .  Breast cancer  Mother    .  Cancer  Father    .  Anesthesia problems  Neg Hx    .  Hypotension  Neg Hx    .  Malignant hyperthermia  Neg Hx    .  Pseudochol deficiency  Neg Hx     Social History: reports that he quit smoking about 5 months ago. His smoking use included Cigarettes. He has a 40 pack-year smoking history. He does not have any smokeless tobacco history on file. He reports that he does not drink alcohol or use illicit drugs.  Allergies: No Known Allergies  Medications Prior to Admission   Medication  Sig  Dispense  Refill   .  amLODipine (NORVASC) 10 MG tablet  Take 10 mg by mouth daily.     Marland Kitchen  aspirin EC 81 MG tablet  Take 1 tablet (81 mg total) by mouth daily.  90 tablet  3   .  calcitRIOL (ROCALTROL) 0.25 MCG capsule  Take 0.25 mcg by mouth daily.     .  calcium acetate (PHOSLO) 667 MG capsule  Take 667-1,334 mg by mouth 3 (three) times daily with meals. 2 caps with each meal, 1 cap with snacks     .  cinacalcet (SENSIPAR) 30 MG tablet  Take 30 mg by mouth at bedtime.     .  gabapentin (NEURONTIN) 100 MG capsule  Take 100 mg by mouth 2 (two) times daily.     .  isosorbide mononitrate (IMDUR) 30 MG 24 hr tablet  Take 30 mg by mouth daily.     Marland Kitchen  lanthanum (FOSRENOL) 1000 MG chewable tablet  Chew 1,000-2,000 mg  by mouth 3 (three) times daily with meals. 2 tabs with each meal, 1 tab with snacks     .  lisinopril (PRINIVIL,ZESTRIL) 30 MG tablet  Take 30 mg by mouth daily.     .  metoprolol succinate (TOPROL-XL) 100 MG 24 hr tablet  Take 100 mg by mouth 2 (two) times daily. Take with or immediately following a meal.     .  metroNIDAZOLE (FLAGYL) 500 MG tablet  Take 1 tablet (500 mg total) by mouth every 8 (eight) hours.  20 tablet  0   .  multivitamin (RENA-VIT) TABS tablet  Take 1 tablet by mouth daily.     Marland Kitchen  oxyCODONE (OXY IR/ROXICODONE) 5 MG immediate release tablet  Take 1-2 tablets (5-10 mg total) by mouth every 6 (six) hours as needed for pain.  30 tablet  0   .  sevelamer (RENVELA) 800 MG tablet  Take 800-2,400 mg by mouth 3 (three) times daily with meals. Take 4 tabs with meals and 1 tab with snacks     .  simvastatin (ZOCOR) 5 MG tablet  Take 5 mg by mouth at bedtime.      Home:  Home Living  Lives With: Alone  Available Help at Discharge: Friend(s);Family;Available PRN/intermittently  Type of Home: Apartment  Home Access: Level entry  Home Layout: One level  Bathroom Shower/Tub: Medical sales representative: Standard  Home Adaptive Equipment: Shower chair with back;Bedside commode/3-in-1;Straight cane;Hand-held shower hose  Functional History:  Prior Function  Able to Take Stairs?: Yes  Driving: No  Vocation: On disability  Functional Status:  Mobility:  Bed Mobility  Bed Mobility: Rolling Right;Right Sidelying to Sit  Rolling Right: 3: Mod assist  Right Sidelying to Sit: 3: Mod assist;With rails;HOB flat  Supine to Sit: 1: +2 Total assist;With rails  Supine to Sit: Patient Percentage: 40%  Sitting - Scoot to Edge of Bed: 3: Mod assist  Sit to Supine: 4: Min guard  Sit to Supine: Patient Percentage: 20%  Transfers  Transfers: Sit to Stand;Stand to [employer] Transfers  Sit to Stand: 1: +2 Total assist;With upper extremity assist;From bed  Sit to Stand: Patient  Percentage: 50%  Stand to Sit: 1: +2 Total assist;With upper extremity assist;With armrests;To chair/3-in-1  Stand to Sit: Patient Percentage: 50%  Stand Pivot Transfers: 1: +2 Total assist;With armrests  Stand Pivot Transfers: Patient Percentage: 50%  Ambulation/Gait  Ambulation/Gait Assistance: Not tested (comment)  Stairs: No  Wheelchair Mobility  Wheelchair Mobility: No  ADL:  ADL  Eating/Feeding: Performed;Supervision/safety  Where Assessed - Eating/Feeding: Chair  Grooming: Performed;Wash/dry face;Set up  Where Assessed - Grooming: Supine, head of bed up  Upper Body Bathing: Simulated;Set up  Where Assessed - Upper Body Bathing: Unsupported sitting  Lower Body Bathing: Simulated;Minimal assistance  Where Assessed - Lower Body Bathing: Supported sit to stand  Upper Body Dressing: Simulated;Supervision/safety  Where Assessed - Upper Body Dressing: Unsupported sitting  Lower Body Dressing: Performed;+1 Total assistance  Where Assessed - Lower Body Dressing: Supine, head of bed up  Toilet Transfer: Simulated;+2 Total assistance  Toilet Transfer Method: Sit to stand  Toilet Transfer Equipment: Raised toilet seat with arms (or 3-in-1 over toilet)  Tub/Shower Transfer Method: Not assessed  Equipment Used: Gait belt  Transfers/Ambulation Related to ADLs: Assist to move RW provided during transfer  ADL Comments: Pt supine on arrival asleep with lunch on bedside table. PT agreeable to OOB to chair. Pt required facilitation of weight shift and multimodal cueing for transfers.  Cognition:  Cognition  Arousal/Alertness: Awake/alert  Orientation Level: Oriented X4  Cognition  Overall Cognitive Status: Impaired  Area of Impairment: Safety/judgement  Arousal/Alertness: Awake/alert  Orientation Level: Appears intact for tasks assessed  Behavior During Session: [facility] for tasks performed  Memory Deficits: some of patient's answers to questions differ from prior admission. Also states last  admission was in December (actually May)  Following Commands: Follows one step commands consistently  Safety/Judgement: Decreased awareness of safety precautions  Awareness of Errors: Assistance required to identify errors made  Cognition - Other Comments: Cognition to be further assessed in context of function. Able to answer questions appropriately and follow multi step commands.   Blood pressure 178/82, pulse 74, temperature 98.7 F (37.1 C), temperature source Oral, resp. rate 18, height 5\' 6"  (1.676 m), weight 61.6 kg (135 lb 12.9 oz), SpO2 92.00%.  Physical Exam  Vitals reviewed.  Constitutional: He is oriented to person, place, and time.  Frail elderly male.  HENT: Pupils equally round and reactive to light, extraocular eye movements intact Head: Normocephalic. Oral mucosa pink and moist. He is edentulous. Neck: Neck supple. No thyromegaly present.  Cardiovascular: Normal rate. An irregularly irregular rhythm present. Occasional PACs are present.  Pulmonary/Chest: Breath sounds normal. He has no wheezes.  Abdominal: He exhibits no distension. There is no tenderness.  Neurological: He is alert and oriented to person, place, and time. Short term memory deficit. Skin:  Surgical site clean dry and dressed with minimal tenderness.  Psychiatric:  Mood is flat but appropriate  motor strength is 5/5 in bilateral bicep, tricep, grip, deltoid  Right lower extremity is 5/5 in the hip flexor knee extensor and ankle dorsiflexor  Left lower extremity is 4/5 in the hip flexor knee extensor and 2 minus at the ankle dorsiflexor plantar  flexor  Sensation is reduced in the right foot to light touch however left foot was not tested due to recent surgery  Left transmetatarsal amputation site nontender to palpation  Patient is comfortable and expressing no outward signs of pain  Results for orders placed during the hospital encounter of 08/26/11 (from the past 48 hour(s))   CBC Status: Abnormal     Collection Time    09/14/11 11:00 AM   Component  Value  Range  Comment    WBC  10.1  4.0 - 10.5 K/uL     RBC  2.98 (*)  4.22 - 5.81 MIL/uL     Hemoglobin  8.5 (*)  13.0 - 17.0 g/dL     HCT  40.9 (*)  81.1 - 52.0 %     MCV  89.6  78.0 - 100.0 fL     MCH  28.5  26.0 - 34.0 pg     MCHC  31.8  30.0 - 36.0 g/dL     RDW  91.4 (*)  78.2 - 15.5 %     Platelets  192  150 - 400 K/uL    RENAL FUNCTION PANEL Status: Abnormal    Collection Time    09/15/11 6:57 AM   Component  Value  Range  Comment    Sodium  138  135 - 145 mEq/L     Potassium  3.9  3.5 - 5.1 mEq/L     Chloride  100  96 - 112 mEq/L     CO2  27  19 - 32 mEq/L     Glucose, Bld  81  70 - 99 mg/dL     BUN  21  6 - 23 mg/dL     Creatinine, Ser  9.56 (*)  0.50 - 1.35 mg/dL     Calcium  8.1 (*)  8.4 - 10.5 mg/dL     Phosphorus  4.5  2.3 - 4.6 mg/dL     Albumin  1.5 (*)  3.5 - 5.2 g/dL     GFR calc non Af Amer  10 (*)  >90 mL/min     GFR calc Af Amer  11 (*)  >90 mL/min    CBC Status: Abnormal    Collection Time    09/15/11 6:57 AM   Component  Value  Range  Comment    WBC  8.4  4.0 - 10.5 K/uL     RBC  2.83 (*)  4.22 - 5.81 MIL/uL     Hemoglobin  8.0 (*)  13.0 - 17.0 g/dL     HCT  21.3 (*)  08.6 - 52.0 %     MCV  88.7  78.0 - 100.0 fL     MCH  28.3  26.0 - 34.0 pg     MCHC  31.9  30.0 - 36.0 g/dL     RDW  57.8 (*)  46.9 - 15.5 %     Platelets  179  150 - 400 K/uL    RETICULOCYTES Status: Abnormal    Collection Time    09/15/11 4:10 PM   Component  Value  Range  Comment    Retic Ct Pct  3.3 (*)  0.4 - 3.1 %     RBC.  3.05 (*)  4.22 - 5.81 MIL/uL     Retic Count, Manual  100.7  19.0 - 186.0 K/uL    CBC Status: Abnormal    Collection Time    09/16/11 5:10 AM   Component  Value  Range  Comment    WBC  8.1  4.0 - 10.5 K/uL     RBC  2.98 (*)  4.22 - 5.81 MIL/uL     Hemoglobin  8.7 (*)  13.0 - 17.0 g/dL     HCT  62.9 (*)  52.8 - 52.0 %     MCV  90.3  78.0 - 100.0 fL     MCH  29.2  26.0 - 34.0 pg     MCHC  32.3  30.0 - 36.0  g/dL     RDW  41.3 (*)  24.4 - 15.5 %     Platelets  185  150 - 400  K/uL     No results found.  Post Admission Physician Evaluation:  1. Functional deficits secondary to left transmetatarsal amputation, deconditioning. 2. Patient is admitted to receive collaborative, interdisciplinary care between the physiatrist, rehab nursing staff, and therapy team. 3. Patient's level of medical complexity and substantial therapy needs in context of that medical necessity cannot be provided at a lesser intensity of care such as a SNF. 4. Patient has experienced substantial functional loss from his/her baseline which was documented above under the "Functional History" and "Functional Status" headings. Judging by the patient's diagnosis, physical exam, and functional history, the patient has potential for functional progress which will result in measurable gains while on inpatient rehab. These gains will be of substantial and practical use upon discharge in facilitating mobility and self-care at the household level. 5. Physiatrist will provide 24 hour management of medical needs as well as oversight of the therapy plan/treatment and provide guidance as appropriate regarding the interaction of the two. 6. 24 hour rehab nursing will assist with bladder management, bowel management, safety, skin/wound care, disease management, medication administration, pain management and patient education and help integrate therapy concepts, techniques,education, etc. 7. PT will assess and treat for: Lower extremity strength, range of motion, safety, weightbearing precautions. Goals are: Supervision to minimal assist to. 8. OT will assess and treat for: Upper extremity strength, functional mobility, safety, ADLs. Goals are: Modified independent to moderate assistance. 9. SLP will assess and treat for: Not applicable at this point. Consider consult in the future if necessary 10. Case Management and Social Worker will assess and treat  for psychological issues and discharge planning. 11. Team conference will be held weekly to assess progress toward goals and to determine barriers to discharge. 12. Patient will receive at least 3 hours of therapy per day at least 5 days per week. 13. ELOS: 10 day Prognosis: good Medical Problem List and Plan:  1. Left transmetatarsal amputation/femoropopliteal bypass secondary to peripheral vascular disease  2. DVT Prophylaxis/Anticoagulation: SCDs to right lower extremity  3. Pain Management: Neurontin, Percocet. Monitor with increased mobility  4. Neuropsych: This patient is capable of making decisions on his/her own behalf.  5. End stage renal disease. Continue dialysis as per [facility]  6. Hypertension. Norvasc 10 mg daily, Imdur 30 mg daily, Lopressor 100 mg twice a day. Monitor with increased mobility  7. Hyperlipidemia. Zocor  8. History congestive heart failure. Monitor for any signs of fluid overload  9. Chronic anemia. Aranesp. Followup CBC with dialysis  Ranelle Oyster M.D. 09/16/2011

## 2011-09-16 NOTE — Progress Notes (Signed)
[facility] [place]  Progress Note Bypass Surgery  Date of Surgery: 08/26/2011 - 09/12/2011  Procedure(s): AMPUTATION BELOW KNEE Surgeon: Surgeon(s): [name], MD  4 Days Post-Op  History of Present Illness  [name] is a 61 y.o. male who is S/P Procedure(s): AMPUTATION transmetatarsal left.  The patient's pre-op symptoms of pain are Improved . Patients pain is some what  well controlled. He wants to wean off the narcotics at some time.    Imaging: No results found.  Significant Diagnostic Studies: CBC Lab Results  Component Value Date   WBC 8.1 09/16/2011   HGB 8.7* 09/16/2011   HCT 26.9* 09/16/2011   MCV 90.3 09/16/2011   PLT 185 09/16/2011    BMET     Component Value Date/Time   NA 138 09/15/2011 0657   K 3.9 09/15/2011 0657   CL 100 09/15/2011 0657   CO2 27 09/15/2011 0657   GLUCOSE 81 09/15/2011 0657   BUN 21 09/15/2011 0657   CREATININE 5.69* 09/15/2011 0657   CALCIUM 8.1* 09/15/2011 0657   GFRNONAA 10* 09/15/2011 0657   GFRAA 11* 09/15/2011 0657    COAG Lab Results  Component Value Date   INR 1.30 09/12/2011   INR 1.38 08/27/2011   INR 1.54* 08/19/2011   No results found for this basename: PTT    Physical Examination  BP Readings from Last 3 Encounters:  09/16/11 162/80  09/16/11 162/80  09/16/11 162/80   Temp Readings from Last 3 Encounters:  09/15/11 98.7 F (37.1 C)   09/15/11 98.7 F (37.1 C)   09/15/11 98.7 F (37.1 C)    SpO2 Readings from Last 3 Encounters:  09/16/11 93%  09/16/11 93%  09/16/11 93%   Pulse Readings from Last 3 Encounters:  09/16/11 74  09/16/11 74  09/16/11 74    Pt is A&O x 3 left lower extremity: Incision/s is/are clean,dry.intact, and  clean, dry, intact or healing without hematoma, erythema or drainage Limb is warm; with good color    Assessment/Plan: Pt. Doing well Post-op pain is controlled Wounds are clean, dry, intact or healing well PT/OT for ambulation Continue wound  care as ordered Transfer to IR for rehab.  Clinton Gallant [facility] [phone] 09/16/2011 8:22 AM

## 2011-09-16 NOTE — Consult Note (Signed)
WOC follow-up consult Note Pt with previous unstageable wound which was present prior to admission.  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  Has been receiving hydrotherapy to assist with removal of nonviable tissue and Santyl for chemical debridement.  Wound has evolved in size and depth as nonviable tissue is removed.  60% yellow, 30% red, 10% bone. 8X4X1cm with undermining from 12 0'clock to 3 oclock. To 1 cm. Small light green drainage, no odor.  Pt has air mattress to reduce pressure; plans to transfer to rehab unit soon. Continue present plan of care to promote healing.  Cammie Mcgee, RN, MSN, [employer]  [phone]

## 2011-09-16 NOTE — Progress Notes (Signed)
Referred to this CSW on yesterday for ?SNF from previous unit/CSW. Per report this morning plan is for d/c to CIR rehab at d/c. CSW to sign off- please contact us if SW needs arise. Reece Levy, MSW, [facility] [phone]

## 2011-09-16 NOTE — Progress Notes (Signed)
[facility] Progress Note  09/16/2011  Subjective: Pt reports that he feels well.  He has no complaints.  He is excited to go to rehab today.    Objective:  Vital signs in last 24 hours: Filed Vitals:   09/15/11 1424 09/15/11 2010 09/15/11 2031 09/16/11 0500  BP: 160/69 197/65 178/82 162/80  Pulse: 80 74  74  Temp: 98.4 F (36.9 C) 98.7 F (37.1 C)    TempSrc: Oral Oral  Oral  Resp: 17 18  19   Height:      Weight:      SpO2: 93% 92%  93%   Weight change: -6.7 kg (-14 lb 12.3 oz)  Intake/Output Summary (Last 24 hours) at 09/16/11 5284 Last data filed at 09/15/11 1750  Gross per 24 hour  Intake    120 ml  Output   2971 ml  Net  -2851 ml   Lab Results  Component Value Date   HGBA1C  Value: 4.3 (NOTE) The ADA recommends the following therapeutic goal for glycemic control related to Hgb A1c measurement: Goal of therapy: <6.5 Hgb A1c  Reference: American Diabetes Association: Clinical Practice Recommendations 2010, Diabetes Care, 2010, 33: (Suppl  1).* 07/22/2008   Lab Results  Component Value Date   LDLCALC  Value: 100        Total Cholesterol/HDL:CHD Risk Coronary Heart Disease Risk Table                     Men   Women  1/2 Average Risk   3.4   3.3  Average Risk       5.0   4.4  2 X Average Risk   9.6   7.1  3 X Average Risk  23.4   11.0        Use the calculated Patient Ratio above and the CHD Risk Table to determine the patient's CHD Risk.        ATP III CLASSIFICATION (LDL):  <100     mg/dL   Optimal  132-440  mg/dL   Near or Above                    Optimal  130-159  mg/dL   Borderline  102-725  mg/dL   High  >366     mg/dL   Very High* 06/25/345   CREATININE 5.69* 09/15/2011    Review of Systems As above, otherwise all reviewed and reported negative  Physical Exam General appearance: cooperative, appears older than stated age and no distress  Resp: clear to auscultation bilaterally  Cardio: regular rate and sinus rhythm, S1, S2 normal, no murmur, click, rub or  gallop  GI: soft, non-tender; bowel sounds normal; no masses, no organomegaly  Extremities: no signif LE edema - wound L foot clean, dry  Neurologic: Grossly normal  Lab Results: Results for orders placed during the hospital encounter of 08/26/11 (from the past 24 hour(s))  RETICULOCYTES     Status: Abnormal   Collection Time   09/15/11  4:10 PM      Component Value Range   Retic Ct Pct 3.3 (*) 0.4 - 3.1 %   RBC. 3.05 (*) 4.22 - 5.81 MIL/uL   Retic Count, Manual 100.7  19.0 - 186.0 K/uL  CBC     Status: Abnormal   Collection Time   09/16/11  5:10 AM      Component Value Range   WBC 8.1  4.0 - 10.5 K/uL   RBC 2.98 (*)  4.22 - 5.81 MIL/uL   Hemoglobin 8.7 (*) 13.0 - 17.0 g/dL   HCT 16.1 (*) 09.6 - 04.5 %   MCV 90.3  78.0 - 100.0 fL   MCH 29.2  26.0 - 34.0 pg   MCHC 32.3  30.0 - 36.0 g/dL   RDW 40.9 (*) 81.1 - 91.4 %   Platelets 185  150 - 400 K/uL    Micro Results: No results found for this or any previous visit (from the past 240 hour(s)).  Medications:  Scheduled Meds:   . amLODipine  10 mg Oral Daily  . antiseptic oral rinse  15 mL Mouth Rinse q12n4p  . chlorhexidine  15 mL Mouth Rinse BID  . cinacalcet  60 mg Oral Q breakfast  . collagenase   Topical Daily  . darbepoetin (ARANESP) injection - DIALYSIS  100 mcg Intravenous Q Wed-HD  . docusate sodium  100 mg Oral Daily  . doxercalciferol  6 mcg Intravenous Q M,W,F-HD  . feeding supplement (NEPRO CARB STEADY)  237 mL Oral BID BM  . gabapentin  100 mg Oral Custom  . gabapentin  200 mg Oral Q M,W,F-2000  . HYDROmorphone      . isosorbide mononitrate  30 mg Oral Daily  . metoprolol tartrate  100 mg Oral BID  . multivitamin  1 tablet Oral QHS  . saccharomyces boulardii  250 mg Oral BID  . simvastatin  5 mg Oral QHS  . sodium chloride  10-40 mL Intracatheter Q12H  . vancomycin  125 mg Oral Q6H   Continuous Infusions:  PRN Meds:.acetaminophen, acetaminophen, bisacodyl, Glycerin (Adult), heparin, heparin, heparin,  hydrALAZINE, HYDROcodone-acetaminophen, labetalol, metoprolol, ondansetron, oxyCODONE-acetaminophen, phenol, senna-docusate, sodium chloride, DISCONTD: sodium chloride, DISCONTD:  HYDROmorphone (DILAUDID) injection  Assessment/Plan: Septic shock  *Resolved  *Multifactorial etiology related to healthcare acquired pneumonia as well as recalcitrant C. difficile colitis   C. difficile colitis- recalcitrant  *Completed oral vancomycin QID 16 days symptoms resolved now *Avoid Nexium to avoid precipitating C. Difficile colitis  *Minimize other antibiotic therapy as able, continue probiotics  Acute respiratory failure with hypoxia/Hospital-acquired pneumonia  *Briefly required intubation during the initial portion of hospitalization  *Now tolerating nasal cannula oxygen  *Last chest x-ray 08/29/2011 demonstrated possible bilateral pneumonia and repeated chest x-ray 09/10/2011 shows no evidence of pneumonia   Peripheral vascular occlusive disease/Ischemia of left foot  *Status post revascularization procedure 09/09/2011  *Post op s/p left transmetatarsal amputation  *Management per vascular team   CKD (chronic kidney disease) stage V requiring chronic dialysis  *Nephrology managing  *Dialyzes on Monday Wednesday and Friday   Metabolic encephalopathy  *Resolved and related to acute septic illness   Anemia of chronic renal failure - baseline hgb 9-10  *Has received 5 units of packed red blood cells so far this this admission  *Given history of coronary artery disease would like to keep hemoglobin greater than or equal to 8.0  *Continue Aranesp  *following CVC, currently holding stable around 8-8.5   Chronic diastolic CHF (congestive heart failure)  *compensated  *Keep blood pressure controlled  *Acute severe exacerbations best treated by urgent dialysis   Hypertension  *Continue Norvasc and metoprolol  *Blood pressure well controlled  *Was on Prinivil as well at home and this is  currently on hold   Atrial fibrillation  *Maintaining sinus rhythm   CAD (coronary artery disease)  *Continue aspirin, beta blocker, imdur and statin   Disposition  *To CIR today  DC'd Central line.   LOS: 21 days  Kerney Hopfensperger 09/16/2011, 9:06 AM  Cleora Fleet, MD, CDE, FAAFP [facility] [place], [place]  [phone]

## 2011-09-16 NOTE — Discharge Summary (Signed)
Physician Discharge Summary  Patient ID: [name] MRN: [id number] DOB/AGE: September 20, 1950 61 y.o.  Admit date: 08/26/2011 Discharge date: 09/16/2011  Admission Diagnoses: Aspiration Pneumonia C. Diff Encephalopathy ESRD CHF CAD Acute Resp Failure Anemia of Chronic Disease  Discharge Diagnoses:  Principal Problem:  *Septic shock Active Problems:  Peripheral vascular occlusive disease  Chronic diastolic CHF (congestive heart failure)  CKD (chronic kidney disease) stage V requiring chronic dialysis  Hypertension  C. 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 He was readmitted through the [facility] ED on 08/26/11 with respiratory failure and sepsis. His family states that he had been doing well since his d/c home on 6/1, but developed constipation on 6/3. On 6/4 he developed nausea and vomiting in the evening followed by respiratory distress and confusion. He was intubated in the ED for respiratory failure with associated encephalopathy. He last went to HD on 6/3. Etiology for respiratory failure was felt likely related to aspiration versus healthcare acquired pneumonia by pulmonary critical care medicine. He was extubated on 08/29/2011. CT scan performed 08/27/2011 demonstrated a possible closed loop bowel obstruction. Patient was evaluated by surgery and found to have no indications for surgical intervention. The abnormal CT scan findings were likely related to severe ileus from recalcitrant C. difficile colitis. Patient is currently being treated with oral vancomycin with  Flagyl and previously this admission received vancomycin enemas. Because of concerns of possible intra-abdominal sepsis and for treatment of healthcare acquired pneumonia patient was started on imipenem and intravenous vancomycin-both of which have been discontinued. Patient improved enough to transfer back to the general medical floor under the care of the hospitalist service. Once patient's initial encephalopathy improved he was complaining of left foot pain and was found to have black discoloration of his toes that was concerning for gangrene. Because of these findings vascular surgery was consulted. Workup did reveal ischemia of the left lower extremity and he subsequently underwent left common femoral artery endarterectomy with vein patch angioplasty and a left femoral to below-the-knee popliteal artery bypass graft with saphenous vein on 09/09/2011. Patient had previously been scheduled for revascularization procedure prior to this admission. He was aware that he would possibly need transmetatarsal amputation of the left foot after successful revascularization. Because of vascular procedures performed patient was transferred to the step down unit immediately postoperative from the postanesthesia care unit and subsequently transferred to medical floor. He is being evaluated for inpatient rehabilitation.   Septic shock  *Resolved  *Multifactorial etiology related to healthcare acquired pneumonia as well as recalcitrant C. difficile colitis  C. difficile colitis- recalcitrant  *Completed oral vancomycin QID 16 days symptoms resolved now  *Avoid Nexium to avoid precipitating C. Difficile colitis  *Minimize other antibiotic therapy as able, continue probiotics  Acute respiratory failure with hypoxia/Hospital-acquired pneumonia  *Briefly required intubation during the initial portion of hospitalization  *Now tolerating nasal cannula oxygen  *Last chest x-ray 08/29/2011 demonstrated possible bilateral  pneumonia and repeated chest x-ray 09/10/2011 shows no evidence of pneumonia  Peripheral vascular occlusive disease/Ischemia of left foot  *Status post revascularization procedure 09/09/2011  *Post op s/p left transmetatarsal amputation  *Management  per vascular team  CKD (chronic kidney disease) stage V requiring chronic dialysis  *Nephrology managing  *Dialyzes on Monday Wednesday and Friday  Metabolic encephalopathy  *Resolved and related to acute septic illness  Anemia of chronic renal failure - baseline hgb 9-10  *Has received 5 units of packed red blood cells so far this this admission  *Given history of coronary artery disease would like to keep hemoglobin greater than or equal to 8.0  *Continue Aranesp  *following CVC, currently holding stable around 8-8.5  Chronic diastolic CHF (congestive heart failure)  *compensated  *Keep blood pressure controlled  *Acute severe exacerbations best treated by urgent dialysis  Hypertension  *Continue Norvasc and metoprolol  *Blood pressure well controlled  *Was on Prinivil as well at home and this is currently on hold  Atrial fibrillation  *Maintaining sinus rhythm  CAD (coronary artery disease)  *Continue aspirin, beta blocker, imdur and statin  Disposition  *To CIR today  DC'd [facility].  Disposition: To CIR inpatient rehab  Discharge Orders    Future Orders Please Complete By Expires   Resume previous diet      Call MD for:  temperature >100.5      Call MD for:  redness, tenderness, or signs of infection (pain, swelling, bleeding, redness, odor or green/yellow discharge around incision site)      Call MD for:  severe or increased pain, loss or decreased feeling  in affected limb(s)      Increase activity slowly      Comments:   Walk with assistance use walker or cane as needed   May shower         Medication List  As of 09/16/2011  9:19 AM   STOP taking these medications         metroNIDAZOLE 500 MG tablet          TAKE these medications         amLODipine 10 MG tablet   Commonly known as: NORVASC   Take 10 mg by mouth daily.      antiseptic oral rinse Liqd   15 mLs by Mouth Rinse route 2 times daily at 12 noon and 4 pm.      aspirin EC 81 MG tablet   Take 1 tablet (81 mg total) by mouth daily.      bisacodyl 5 MG EC tablet   Commonly known as: DULCOLAX   Take 1 tablet (5 mg total) by mouth daily as needed.      calcitRIOL 0.25 MCG capsule   Commonly known as: ROCALTROL   Take 0.25 mcg by mouth daily.      calcium acetate 667 MG capsule   Commonly known as: PHOSLO   Take 667-1,334 mg by mouth 3 (three) times daily with meals. 2 caps with each meal, 1 cap with snacks      cinacalcet 30 MG tablet   Commonly known as: SENSIPAR   Take 30 mg by mouth at bedtime.      cinacalcet 30 MG tablet   Commonly known as: SENSIPAR   Take 2 tablets (60 mg total) by mouth daily with breakfast.      collagenase ointment   Commonly known as: SANTYL   Apply topically daily.      darbepoetin 100 MCG/0.5ML Soln   Commonly known as: ARANESP   Inject 0.5 mLs (100 mcg total) into the vein every Wednesday with hemodialysis.      doxercalciferol 4 MCG/2ML injection   Commonly known  asBonney Leitz   Inject 3 mLs (6 mcg total) into the vein every Monday, Wednesday, and Friday with hemodialysis.      DSS 100 MG Caps   Take 100 mg by mouth daily.      feeding supplement (NEPRO CARB STEADY) Liqd   Take 237 mLs by mouth 2 (two) times daily between meals.      gabapentin 100 MG capsule   Commonly known as: NEURONTIN   Take 100 mg by mouth 2 (two) times daily.      gabapentin 100 MG capsule   Commonly known as: NEURONTIN   Take 1 capsule (100 mg total) by mouth 3 (three) times daily.      gabapentin 100 MG capsule   Commonly known as: NEURONTIN   Take 2 capsules (200 mg total) by mouth every Monday, Wednesday, and Friday at 8 PM.      Glycerin (Adult) 2.1 G Supp   Place 1 suppository rectally as  needed.      hydrALAZINE 20 MG/ML injection   Commonly known as: APRESOLINE   Inject 0.5 mLs (10 mg total) into the vein every 2 (two) hours as needed (high blood pressure).      HYDROcodone-acetaminophen 5-325 MG per tablet   Commonly known as: NORCO   Take 1-2 tablets by mouth every 4 (four) hours as needed.      isosorbide mononitrate 30 MG 24 hr tablet   Commonly known as: IMDUR   Take 30 mg by mouth daily.      lanthanum 1000 MG chewable tablet   Commonly known as: FOSRENOL   Chew 1,000-2,000 mg by mouth 3 (three) times daily with meals. 2 tabs with each meal, 1 tab with snacks                  metoprolol succinate 100 MG 24 hr tablet   Commonly known as: TOPROL-XL   Take 100 mg by mouth 2 (two) times daily. Take with or immediately following a meal.      multivitamin Tabs tablet   Take 1 tablet by mouth daily.      oxyCODONE 5 MG immediate release tablet   Commonly known as: Oxy IR/ROXICODONE   Take 1-2 tablets (5-10 mg total) by mouth every 6 (six) hours as needed for pain.      sevelamer 800 MG tablet   Commonly known as: RENVELA   Take 800-2,400 mg by mouth 3 (three) times daily with meals. Take 4 tabs with meals and 1 tab with snacks      simvastatin 5 MG tablet   Commonly known as: ZOCOR   Take 5 mg by mouth at bedtime.           I spent 45 mins preparing discharge, reviewing long hospitalization, reconciling meds, etc.   Signed: Sheral Pfahler 09/16/2011, 9:19 AM

## 2011-09-16 NOTE — Progress Notes (Signed)
[facility]  Subjective:  Awake. Alert.  Says he lives alone in [place].  To Rehab on 4000 today.     Objective: Vital signs in last 24 hours: Blood pressure 162/80, pulse 74, temperature 98.7 F (37.1 C), temperature source Oral, resp. rate 19, height 5\' 6"  (1.676 m), weight 61.6 kg (135 lb 12.9 oz), SpO2 93.00%.    PHYSICAL EXAM General--awake, alert Chest--cath for HD L IJ Heart--no rub Abd--nontender Extr--clotted avf in R upper arm          3 lumen cath still in R fem vein          FPBG and TMA on L LE  Lab Results:   Lab 09/15/11 0657 09/12/11 0410 09/11/11 0410 09/10/11 0730  NA 138 138 135 --  K 3.9 4.4 4.3 --  CL 100 102 101 --  CO2 27 26 27  --  BUN 21 13 8  --  CREATININE 5.69* 4.57* 3.41* --  ALB -- -- -- --  GLUCOSE 81 -- -- --  CALCIUM 8.1* 8.6 8.5 --  PHOS 4.5 4.3 -- 4.8*     Basename 09/16/11 0510 09/15/11 0657  WBC 8.1 8.4  HGB 8.7* 8.0*  HCT 26.9* 25.1*  PLT 185 179    Assessment/Plan: 1.  Sepsis Syndrome with C diff colitis/PNA: Improved - now on PO vanco only for C diff. 2.  PVD- S/p fempop and mid foot amputation last week. Rehab today.  He lives alone.  Per [facility] 3.  ESRD: continue MWF HD via [facility] (apparently no other options?). AVF clotted in R upper arm.  [facility] unit is [facility]. Plan for next HD tomorrow. 4.  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 Phos is 4.5.  6.  HTN/volume- controlled with HD and meds 7. Anemia- hgb 8.7 today.  Fe/TIBC 30 % sat, ferritin-NA (3329 on 23 May) 8. Dispo- Rehab today Please take out R femoral triple lumen cath       LOS: 21 days   Elorah Dewing F 09/16/2011,9:58 AM   .labalb

## 2011-09-16 NOTE — Progress Notes (Signed)
Physical Therapy Treatment Patient Details Name: [name] MRN: [id number] DOB: Aug 29, 1950 Today's Date: 09/16/2011 Time: 7829-5621 PT Time Calculation (min): 26 min  PT Assessment / Plan / Recommendation Comments on Treatment Session  pt presents with sepsis, PNA, and L Transmet Amputation.  pt with difficulty maintaining NWBing on L and often rests it on R foot.  Would benefit from CIR at D/C.      Follow Up Recommendations  Inpatient Rehab;Skilled nursing facility    Barriers to Discharge        Equipment Recommendations  Defer to next venue    Recommendations for Other Services    Frequency Min 2X/week   Plan Discharge plan needs to be updated;Frequency remains appropriate    Precautions / Restrictions Precautions Precautions: Fall (contact for C-diff) Restrictions Weight Bearing Restrictions: Yes LLE Weight Bearing: Non weight bearing   Pertinent Vitals/Pain 7/10 L LE when dependent.      Mobility  Bed Mobility Bed Mobility: Rolling Left;Left Sidelying to Sit;Sitting - Scoot to Edge of Bed Rolling Left: 3: Mod assist;With rail Left Sidelying to Sit: 3: Mod assist;HOB flat;With rails Sitting - Scoot to Edge of Bed: 5: Supervision Details for Bed Mobility Assistance: cues for technique, encouragement.   Transfers Transfers: Sit to Stand;Stand to Sit;Stand Pivot Transfers Sit to Stand: 1: +2 Total assist;With upper extremity assist;From bed Sit to Stand: Patient Percentage: 40% Stand to Sit: 1: +2 Total assist;With upper extremity assist;To chair/3-in-1;With armrests Stand to Sit: Patient Percentage: 50% Stand Pivot Transfers: 1: +2 Total assist Stand Pivot Transfers: Patient Percentage: 40% Details for Transfer Assistance: max cueing for safe technique, upright posture, NWBing L LE Ambulation/Gait Ambulation/Gait Assistance: Not tested (comment) Stairs: No Wheelchair Mobility Wheelchair Mobility: No    Exercises     PT Diagnosis:    PT Problem List:     PT Treatment Interventions:     PT Goals Acute Rehab PT Goals Time For Goal Achievement: 09/27/11 PT Goal: Supine/Side to Sit - Progress: Progressing toward goal PT Goal: Sit to Stand - Progress: Progressing toward goal PT Transfer Goal: Bed to Chair/Chair to Bed - Progress: Progressing toward goal  Visit Information  Last PT Received On: 09/16/11 Assistance Needed: +2    Subjective Data  Subjective: pt notes plan to D/C to rehab today.     Cognition  Overall Cognitive Status: Impaired Area of Impairment: Safety/judgement Arousal/Alertness: Awake/alert Orientation Level: Appears intact for tasks assessed Behavior During Session: [facility] for tasks performed Following Commands: Follows one step commands consistently Safety/Judgement: Decreased awareness of safety precautions    Balance  Balance Balance Assessed: No  End of Session PT - End of Session Equipment Utilized During Treatment: Gait belt Activity Tolerance: Patient limited by fatigue;Patient limited by pain Patient left: in chair;with call bell/phone within reach Nurse Communication: Mobility status    Sunny Schlein, [place] [phone] 09/16/2011, 10:56 AM

## 2011-09-16 NOTE — Progress Notes (Signed)
Hydrotherapy Note   09/16/11 0900  Subjective Assessment  Subjective I think I'm going to that rehab today.    Evaluation and Treatment  Evaluation and Treatment Procedures Explained to Patient/Family Yes  Evaluation and Treatment Procedures agreed to  Pressure Ulcer 08/27/11 Stage II -  Partial thickness loss of dermis presenting as a shallow open ulcer with a red, pink wound bed without slough.  Date First Assessed/Time First Assessed: 08/27/11 0800   Location: Sacrum  Staging: Stage II -  Partial thickness loss of dermis presenting as a shallow open ulcer with a red, pink wound bed without slough.  State of Healing Early/partial granulation  Site / Wound Assessment Black;Pink;Yellow  % Wound base Red or Granulating 30%  % Wound base Yellow 60%  % Wound base Black 10%  % Wound base Other (Comment) 0%  Drainage Amount Moderate  Drainage Description Purulent  Treatment Debridement (Selective);Hydrotherapy (Pulse lavage)  Dressing Type ABD;Gauze (Comment);Tape dressing;Barrier Film (skin prep);Moist to dry (Santyl)  Dressing Changed  Hydrotherapy  Pulsed Lavage with Suction (psi) 8 psi  Pulsed Lavage with Suction - Normal Saline Used 1000 mL  Pulsed Lavage Tip Tip with splash shield  Pulsed lavage therapy - wound location Sacrum  Selective Debridement  Selective Debridement - Location sacrum  Selective Debridement - Tools Used Forceps;Scissors  Selective Debridement - Tissue Removed yellow slough  Wound Therapy - Assess/Plan/Recommendations  Wound Therapy - Clinical Statement Slow improvement continues.  Wound Therapy - Functional Problem List Decreased mobility  Factors Delaying/Impairing Wound Healing Immobility;Multiple medical problems;Polypharmacy  Hydrotherapy Plan Debridement;Dressing change;Patient/family education;Pulsatile lavage with suction  Wound Therapy - Frequency 6X / week  Wound Therapy - Follow Up Recommendations Home health RN  Wound Therapy Goals - Improve the  function of patient's integumentary system by progressing the wound(s) through the phases of wound healing by:  Decrease Necrotic Tissue - Progress Progressing toward goal  Increase Granulation Tissue - Progress Progressing toward goal    Mack Hook, PT [phone]

## 2011-09-16 NOTE — PMR Pre-admission (Signed)
PMR Admission Coordinator Pre-Admission Assessment  Patient: [name] is an 61 y.o., male MRN: [id number] DOB: 01/02/1951 Height: 5\' 6"  (167.6 cm) Weight: 61.6 kg (135 lb 12.9 oz)  Insurance Information HMO:     PPO:      PCP:      IPA:      80/20:      OTHER: X PRIMARY: Medicare      Policy#: [id number]      Subscriber: patient CM Name:       Phone#:      Fax#:  Pre-Cert#:       Employer: disability Benefits:  Phone #: [employer]     Name:  Eff. Date: 07/22/00 A and B     Deduct: $1184      Out of Pocket Max:       Life Max:  CIR: 100%      SNF: LBD= 08/23/11; full SNF Outpatient: 80%     Co-Pay: 20% Home Health: 100%      Co-Pay: 0 DME: 80%     Co-Pay: 20% Providers: in network SECONDARY: Medicaid      Policy#: [id number]      Subscriber: patient CM Name:       Phone#:      Fax#:  Pre-Cert#:       Employer:  Benefits:  Phone #:      Name:  Eff. 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 right-handed male with history of peripheral vascular disease as well end stage renal disease with hemodialysis. Recently hospitalized for severe sepsis due to C. difficile complicated by respiratory failure and sepsis. He was recently discharged home 08/23/2011. Presented with ischemic left foot on 09/08/2011. He has been followed by vascular surgery with plan for femoropopliteal bypass but delayed due to recent C.  difficile. Underwent femoropopliteal bypass 09/09/2011 per Dr. Myra Gianotti. Ongoing ischemic changes to left lower extremity and ultimately underwent left transmetatarsal amputation 09/12/2011 per Dr. Myra Gianotti. Postoperative pain management as well as anemia 7.8. Hemodialysis ongoing as per renal services. Physical therapy evaluation completed 09/13/2011. M.D. has requested physical medicine rehabilitation consult to consider inpatient rehabilitation services. Patient was felt to be due to candidate for inpatient rehabilitation services and was admitted for comprehensive rehabilitation program       Past Medical History  Past Medical History  Diagnosis Date  . Hyperlipidemia   . ESRD (end stage renal disease)     a. MWF dialysis in [place] (followed by Dr. Kristian Covey)  . Arthritis   . Claudication   . Stroke 2005  . Anemia   . Chronic diastolic CHF (congestive heart failure) 07/2010    a. 07/2008 Echo EF 50-55%, mild-mod LVH, Gr  1 DD.  . Peripheral vascular occlusive disease     a. 07/2011 Periph Angio: No signif Ao-illiac dzs, LCF stenosis, 100% LSFA w recon above knee pop and 3 vessel runoff, 100% RSFA w/ recon above knee --> pending L Fem to below Knee pop bypass.  Marland Kitchen History of tobacco abuse   . Hypertension     takes Amlodipine,Metoprolol,and Prinivil daily  . Dyspnea on exertion     with exertion    Family History  family history includes Breast cancer in his mother and Cancer in his father.  There is no history of Anesthesia problems, and Hypotension, and Malignant hyperthermia, and Pseudochol deficiency, .  Prior Rehab/Hospitalizations: no prior CIR stays   Current Medications  Current facility-administered medications:acetaminophen (TYLENOL) suppository 325-650 mg, 325-650 mg, Rectal, Q4H PRN, Marlowe Shores, PA;  acetaminophen (TYLENOL) tablet 325-650 mg, 325-650 mg, Oral, Q4H PRN, Amelia Jo Roczniak, PA, 650 mg at 09/14/11 1750;  amLODipine (NORVASC) tablet 10 mg, 10 mg, Oral,  Daily, Dyke Maes, MD, 10 mg at 09/15/11 1150 antiseptic oral rinse (BIOTENE) solution 15 mL, 15 mL, Mouth Rinse, q12n4p, Lupita Leash, MD, 15 mL at 09/13/11 1200;  bisacodyl (DULCOLAX) EC tablet 5 mg, 5 mg, Oral, Daily PRN, Lars Mage, PA;  chlorhexidine (PERIDEX) 0.12 % solution 15 mL, 15 mL, Mouth Rinse, BID, Lupita Leash, MD, 15 mL at 09/16/11 0749;  cinacalcet (SENSIPAR) tablet 60 mg, 60 mg, Oral, Q breakfast, Dyke Maes, MD, 60 mg at 09/15/11 1150 collagenase (SANTYL) ointment, , Topical, Daily, Lonia Blood, MD;  darbepoetin (ARANESP) injection 100 mcg, 100 mcg, Intravenous, Q Wed-HD, Vonna Kotyk K. Allena Katz, MD, 100 mcg at 09/10/11 0945;  docusate sodium (COLACE) capsule 100 mg, 100 mg, Oral, Daily, Amelia Jo [place], [place], 100 mg at 09/15/11 1150;  doxercalciferol (HECTOROL) injection 6 mcg, 6 mcg, Intravenous, Q M,W,F-HD, Maree Krabbe, MD, 6 mcg at 09/15/11 0804 feeding supplement (NEPRO CARB STEADY) liquid 237 mL, 237 mL, Oral, BID BM, Hettie Holstein, RD, 237 mL at 09/13/11 1337;  gabapentin (NEURONTIN) capsule 100 mg, 100 mg, Oral, Custom, Ann Held, PHARMD;  gabapentin (NEURONTIN) capsule 200 mg, 200 mg, Oral, Q M,W,F-2000, Ann Held, PHARMD, 200 mg at 09/15/11 1959 Glycerin (Adult) 2.1 G suppository 1 suppository, 1 suppository, Rectal, PRN, Leanne Chang, NP, 1 suppository at 09/11/11 0844;  heparin injection 1,400 Units, 20 Units/kg, Dialysis, PRN, Cecille Aver, MD;  heparin injection 2,000 Units, 2,000 Units, Dialysis, PRN, Maree Krabbe, MD, 2,000 Units at 09/08/11 0505;  heparin injection 2,000 Units, 2,000 Units, Dialysis, PRN, Maree Krabbe, MD hydrALAZINE (APRESOLINE) injection 10 mg, 10 mg, Intravenous, Q2H PRN, Marlowe Shores, PA;  HYDROcodone-acetaminophen (NORCO) 5-325 MG per tablet 1-2 tablet, 1-2 tablet, Oral, Q4H PRN, Ann Held, PHARMD, 2 tablet at 09/16/11 0753;  isosorbide mononitrate (IMDUR)  24 hr tablet 30 mg, 30 mg, Oral, Daily, Calvert Cantor, MD, 30 mg at 09/15/11 1150 labetalol (NORMODYNE,TRANDATE) injection 10 mg, 10 mg, Intravenous, Q2H PRN, Marlowe Shores, PA;  metoprolol (LOPRESSOR) injection 2-5 mg, 2-5 mg, Intravenous, Q2H PRN, Marlowe Shores, PA;  metoprolol (LOPRESSOR) tablet 100 mg, 100 mg, Oral, BID, Dyke Maes, MD, 100 mg at 09/15/11 2031;  multivitamin (RENA-VIT) tablet 1 tablet, 1 tablet, Oral, QHS, Dyke Maes, MD, 1 tablet at 09/15/11 2031 ondansetron [facility]) injection 4 mg, 4 mg, Intravenous, Q6H PRN, Marlowe Shores, PA;  oxyCODONE-acetaminophen (PERCOCET) 5-325 MG per tablet 1-2 tablet, 1-2  tablet, Oral, Q4H PRN, Ann Held, PHARMD, 2 tablet at 09/15/11 2314;  phenol (CHLORASEPTIC) mouth spray 1 spray, 1 spray, Mouth/Throat, PRN, Marlowe Shores, PA;  saccharomyces boulardii (FLORASTOR) capsule 250 mg, 250 mg, Oral, BID, Clanford Cyndie Mull, MD, 250 mg at 09/15/11 2032 senna-docusate (Senokot-S) tablet 1 tablet, 1 tablet, Oral, QHS PRN, Altha Harm, MD;  simvastatin (ZOCOR) tablet 5 mg, 5 mg, Oral, QHS, Russella Dar, NP, 5 mg at 09/15/11 2031;  sodium chloride 0.9 % injection 10-40 mL, 10-40 mL, Intracatheter, Q12H, Altha Harm, MD, 10 mL at 09/13/11 1041;  sodium chloride 0.9 % injection 10-40 mL, 10-40 mL, Intracatheter, PRN, Altha Harm, MD, 10 mL at 09/16/11 0510 vancomycin (VANCOCIN) 50 mg/mL oral solution 125 mg, 125 mg, Oral, Q6H, Oretha Milch, MD, 125 mg at 09/16/11 1610;  DISCONTD: 0.9 %  sodium chloride infusion, 250 mL, Intravenous, PRN, Lupita Leash, MD, 250 mL at 09/07/11 0700;  DISCONTD: HYDROmorphone (DILAUDID) injection 1-2 mg, 1-2 mg, Intravenous, Q3H PRN, Calvert Cantor, MD, 2 mg at 09/15/11 1315  Patients Current Diet: Renal  Precautions / Restrictions Precautions Precautions: Fall (contact for C-diff) Precaution Comments: wound vac, jp drain, iv in Rt thigh Restrictions Weight Bearing  Restrictions: Yes LLE Weight Bearing: Non weight bearing   Prior Activity Level Limited Community (1-2x/wk): limited due to sickness and prolonged hospitalization; lived alone PTA Home Assistive Devices / Equipment Home Assistive Devices/Equipment: Cane (specify quad or straight);Shower chair with back Home Adaptive Equipment: Hand-held shower hose;Shower chair with back;Straight cane;Walker - rolling;Raised toilet seat with rails  Prior Functional Level Prior Function Level of Independence: Independent Able to Take Stairs?: Yes Driving: Yes Vocation: On disability  Current Functional Level Cognition  Arousal/Alertness: Awake/alert Overall Cognitive Status: Impaired Memory Deficits: some of patient's answers to questions differ from prior admission.  Also states last admission was in December (actually May) Orientation Level: Oriented X4 Following Commands: Follows one step commands consistently Safety/Judgement: Decreased awareness of safety precautions Awareness of Errors: Assistance required to identify errors made Cognition - Other Comments: Cognition to be further assessed in context of function.  Able to answer questions appropriately and follow multi step commands.    Extremity Assessment (includes Sensation/Coordination)  RUE ROM/Strength/Tone: WFL for tasks assessed RUE ROM/Strength/Tone Deficits: General weakness - grossly 3+/5 RUE Sensation: WFL - Light Touch RUE Coordination: WFL - gross/fine motor  RLE ROM/Strength/Tone: Deficits RLE ROM/Strength/Tone Deficits: Weakness - grossly 4-/5    ADLs  Eating/Feeding: Performed;Supervision/safety Where Assessed - Eating/Feeding: Chair Grooming: Performed;Wash/dry face;Set up Where Assessed - Grooming: Supine, head of bed up Upper Body Bathing: Simulated;Set up Where Assessed - Upper Body Bathing: Unsupported sitting Lower Body Bathing: Simulated;Minimal assistance Where Assessed - Lower Body Bathing: Supported sit to  stand Upper Body Dressing: Simulated;Supervision/safety Where Assessed - Upper Body Dressing: Unsupported sitting Lower Body Dressing: Performed;+1 Total assistance Where Assessed - Lower Body Dressing: Supine, head of bed up Toilet Transfer: Simulated;+2 Total assistance Toilet Transfer: Patient Percentage: 50% Toilet Transfer Method: Sit to stand Toilet Transfer Equipment: Raised toilet seat with arms (or 3-in-1 over toilet) Toileting - Clothing Manipulation and Hygiene: Simulated;Moderate assistance Where Assessed - Toileting Clothing Manipulation and Hygiene: Sit to stand from 3-in-1 or toilet Tub/Shower Transfer Method: Not assessed Equipment Used: Gait belt Transfers/Ambulation Related to ADLs: Assist to move RW provided during transfer ADL Comments: Pt supine on arrival asleep with lunch on bedside table. PT agreeable to OOB to chair. Pt required facilitation of weight shift and  multimodal cueing for transfers.     Mobility  Bed Mobility: Rolling Left;Left Sidelying to Sit;Sitting - Scoot to [place] of Bed Rolling Right: 3: Mod assist Right Sidelying to Sit: 3: Mod assist;With rails;HOB flat Supine to Sit: 1: +2 Total assist;With rails Supine to Sit: Patient Percentage: 40% Sitting - Scoot to Edge of Bed: 3: Mod assist Sit to Supine: 4: Min guard Sit to Supine: Patient Percentage: 20%    Transfers  Transfers: Sit to Stand;Stand to [employer] Transfers Sit to Stand: 1: +2 Total assist;With upper extremity assist;From bed Sit to Stand: Patient Percentage: 50% Stand to Sit: 1: +2 Total assist;With upper extremity assist;With armrests;To chair/3-in-1 Stand to Sit: Patient Percentage: 50% Stand Pivot Transfers: 1: +2 Total assist;With armrests Stand Pivot Transfers: Patient Percentage: 50%    Ambulation / Gait / Stairs / Wheelchair Mobility  Ambulation/Gait Ambulation/Gait Assistance: Not tested (comment) Stairs: No Wheelchair Mobility Wheelchair Mobility: No    Posture  / Games developer Sitting - Balance Support: Feet supported;Bilateral upper extremity supported Static Sitting - Level of Assistance: 5: Stand by assistance Static Sitting - Comment/# of Minutes: Patient able to sit EOB in prep for standing with cues for postioning to maintain upright balance.  Dynamic Sitting Balance Dynamic Sitting - Balance Support: Feet supported;No upper extremity supported Dynamic Sitting - Level of Assistance: 5: Stand by assistance Static Standing Balance Static Standing - Balance Support: Right upper extremity supported;Left upper extremity supported Static Standing - Level of Assistance: 4: Min assist     Previous Home Environment Living Arrangements: Alone Lives With: Alone Available Help at Discharge: Family;Friend(s) Type of Home: Apartment Home Layout: One level Home Access: Level entry Bathroom Shower/Tub: Engineer, manufacturing systems: Handicapped height Bathroom Accessibility: Yes How Accessible: Accessible via walker Home Care Services: No Additional Comments: will be going to live with sister post d/c  Discharge Living Setting Plans for Discharge Living Setting: Mobile Home;Lives with (comment) (will go home with sister) Type of Home at Discharge: Mobile home Discharge Home Layout: One level Discharge Home Access: Stairs to enter Entrance Stairs-Number of Steps: 6 Discharge Bathroom Shower/Tub: Tub/shower unit;Curtain Discharge Bathroom Toilet: Standard Discharge Bathroom Accessibility: Yes How Accessible: Accessible via walker Do you have any problems obtaining your medications?: No  Social/Family/Support Systems Patient Roles: Parent (brother) Contact Information: [phone] Anticipated Caregiver: sister- Jimmie Standish Anticipated Caregiver's Contact Information: cell # [phone] Ability/Limitations of Caregiver: works 5am-12pm, but family will provide assistance Caregiver Availability: 24/7 Discharge Plan  Discussed with Primary Caregiver: Yes Is Caregiver In Agreement with Plan?: Yes Does Caregiver/Family have Issues with Lodging/Transportation while Pt is in Rehab?: No  Goals/Additional Needs Patient/Family Goal for Rehab: Supervision PT/OT Expected length of stay: 10-12 days Cultural Considerations: none Dietary Needs: Regular Equipment Needs: to be determined Special Service Needs: receives dialysis M-W-F Pt/Family Agrees to Admission and willing to participate: Yes Program Orientation Provided & Reviewed with Pt/Caregiver Including Roles  & Responsibilities: Yes  Patient Condition: This patient's condition remains as documented in the Consult dated 09/15/11 @ 1710, in which the Rehabilitation Physician determined and documented that the patient's condition is appropriate for intensive rehabilitative care in an inpatient rehabilitation facility.  Preadmission Screen Completed By:  Oletta Darter, 09/16/2011 10:13 AM ______________________________________________________________________   Discussed status with Dr. Riley Kill on6/25/13 at 0700 and received telephone approval for admission today.  Admission Coordinator:  Oletta Darter, time10:18/Date6/25/13

## 2011-09-17 ENCOUNTER — Inpatient Hospital Stay (HOSPITAL_COMMUNITY): Payer: Medicare Other

## 2011-09-17 DIAGNOSIS — N186 End stage renal disease: Secondary | ICD-10-CM

## 2011-09-17 DIAGNOSIS — L98499 Non-pressure chronic ulcer of skin of other sites with unspecified severity: Secondary | ICD-10-CM

## 2011-09-17 DIAGNOSIS — E1165 Type 2 diabetes mellitus with hyperglycemia: Secondary | ICD-10-CM

## 2011-09-17 DIAGNOSIS — Z48812 Encounter for surgical aftercare following surgery on the circulatory system: Secondary | ICD-10-CM

## 2011-09-17 DIAGNOSIS — S98919A Complete traumatic amputation of unspecified foot, level unspecified, initial encounter: Secondary | ICD-10-CM

## 2011-09-17 DIAGNOSIS — Z5189 Encounter for other specified aftercare: Secondary | ICD-10-CM

## 2011-09-17 DIAGNOSIS — I739 Peripheral vascular disease, unspecified: Secondary | ICD-10-CM

## 2011-09-17 LAB — CBC
HCT: 26.8 % — ABNORMAL LOW (ref 39.0–52.0)
MCH: 28.1 pg (ref 26.0–34.0)
MCHC: 31.7 g/dL (ref 30.0–36.0)
MCV: 88.4 fL (ref 78.0–100.0)
RDW: 17.8 % — ABNORMAL HIGH (ref 11.5–15.5)

## 2011-09-17 LAB — RENAL FUNCTION PANEL
Albumin: 1.5 g/dL — ABNORMAL LOW (ref 3.5–5.2)
BUN: 22 mg/dL (ref 6–23)
Creatinine, Ser: 4.97 mg/dL — ABNORMAL HIGH (ref 0.50–1.35)
Phosphorus: 4.7 mg/dL — ABNORMAL HIGH (ref 2.3–4.6)

## 2011-09-17 MED ORDER — HYDROCODONE-ACETAMINOPHEN 5-325 MG PO TABS
ORAL_TABLET | ORAL | Status: AC
  Filled 2011-09-17: qty 2

## 2011-09-17 MED ORDER — DOXERCALCIFEROL 4 MCG/2ML IV SOLN
INTRAVENOUS | Status: AC
  Filled 2011-09-17: qty 4

## 2011-09-17 MED ORDER — DARBEPOETIN ALFA-POLYSORBATE 100 MCG/0.5ML IJ SOLN
INTRAMUSCULAR | Status: AC
  Filled 2011-09-17: qty 0.5

## 2011-09-17 NOTE — Progress Notes (Signed)
INITIAL ADULT NUTRITION ASSESSMENT Date: 09/17/2011   Time: 11:08 AM  Reason for Assessment: Health History  ASSESSMENT: Male 61 y.o.  Dx: amputation of foot  Hx:  Past Medical History  Diagnosis Date  . Hyperlipidemia   . ESRD (end stage renal disease)     a. MWF dialysis in [place] (followed by Dr. Kristian Covey)  . Arthritis   . Claudication   . Stroke 2005  . Anemia   . Chronic diastolic CHF (congestive heart failure) 07/2010    a. 07/2008 Echo EF 50-55%, mild-mod LVH, Gr 1 DD.  Marland Kitchen Peripheral vascular occlusive disease     a. 07/2011 Periph Angio: No signif Ao-illiac dzs, LCF stenosis, 100% LSFA w recon above knee pop and 3 vessel runoff, 100% RSFA w/ recon above knee --> pending L Fem to below Knee pop bypass.  Marland Kitchen History of tobacco abuse   . Hypertension     takes Amlodipine,Metoprolol,and Prinivil daily  . Dyspnea on exertion     with exertion   Past Surgical History  Procedure Date  . Arteriovenous graft placement   . Av fistula placement 12/10/2000    Right brachiocephalic arteriovenous   . Hernia repair   . Aortagram 07/29/2011    Abdominal Aortagram  . Cardiac catheterization 08/01/11    Left heart catheterization  . Femoral-popliteal bypass graft 09/09/2011    Procedure: BYPASS GRAFT FEMORAL-POPLITEAL ARTERY;  Surgeon: [name], MD;  Location: [facility] OR;  Service: Vascular;  Laterality: Left;   Related Meds:     . amLODipine  10 mg Oral Daily  . antiseptic oral rinse  15 mL Mouth Rinse q12n4p  . chlorhexidine  15 mL Mouth Rinse BID  . cinacalcet  60 mg Oral Q breakfast  . collagenase   Topical Daily  . darbepoetin (ARANESP) injection - DIALYSIS  100 mcg Intravenous Q Wed-HD  . docusate sodium  100 mg Oral Daily  . doxercalciferol  6 mcg Intravenous Q M,W,F-HD  . feeding supplement (NEPRO CARB STEADY)  237 mL Oral BID BM  . gabapentin  100 mg Oral Custom  . gabapentin  200 mg Oral Q M,W,F-2000  . isosorbide mononitrate  30 mg Oral Daily  . metoprolol tartrate   100 mg Oral BID  . multivitamin  1 tablet Oral QHS  . saccharomyces boulardii  250 mg Oral BID  . simvastatin  5 mg Oral QHS  . sodium chloride  10-40 mL Intracatheter Q12H   Ht: 5\' 6"  (167.6 cm)  Wt: 61.7 kg (dry wt on 6/24)  Ideal Wt: 64.5 kg % Ideal Wt: 94%  Wt Readings from Last 15 Encounters:  09/17/11 146 lb 2.6 oz (66.3 kg)  09/15/11 135 lb 12.9 oz (61.6 kg)  09/15/11 135 lb 12.9 oz (61.6 kg)  09/15/11 135 lb 12.9 oz (61.6 kg)  08/23/11 147 lb 11.3 oz (67 kg)  08/11/11 157 lb (71.215 kg)  08/05/11 157 lb (71.215 kg)  08/04/11 152 lb 6.4 oz (69.128 kg)  08/01/11 134 lb (60.782 kg)  08/01/11 134 lb (60.782 kg)  07/31/11 144 lb (65.318 kg)  07/31/11 147 lb (66.679 kg)  07/29/11 155 lb (70.308 kg)  07/29/11 155 lb (70.308 kg)  07/21/11 155 lb 4.8 oz (70.444 kg)  Usual Wt: 135 - 155 lb % Usual Wt: 100%  Body mass index is 23.59 kg/(m^2). WNL  Food/Nutrition Related Hx: Regular diet PTA, does not follow renal restrictions  Labs:  CMP     Component Value Date/Time   NA  138 09/15/2011 0657   K 3.9 09/15/2011 0657   CL 100 09/15/2011 0657   CO2 27 09/15/2011 0657   GLUCOSE 81 09/15/2011 0657   BUN 21 09/15/2011 0657   CREATININE 5.69* 09/15/2011 0657   CALCIUM 8.1* 09/15/2011 0657   PROT 5.4* 08/29/2011 0429   ALBUMIN 1.5* 09/15/2011 0657   AST 31 08/29/2011 0429   ALT 8 08/29/2011 0429   ALKPHOS 60 08/29/2011 0429   BILITOT 0.5 08/29/2011 0429   GFRNONAA 10* 09/15/2011 0657   GFRAA 11* 09/15/2011 0657    Phosphorus  Date/Time Value Range Status  09/15/2011  6:57 AM 4.5  2.3 - 4.6 mg/dL Final  04/11/1476  2:95 AM 4.3  2.3 - 4.6 mg/dL Final  09/11/3084  5:78 AM 4.8* 2.3 - 4.6 mg/dL Final      Intake/Output Summary (Last 24 hours) at 09/17/11 1113 Last data filed at 09/17/11 0900  Gross per 24 hour  Intake    480 ml  Output      0 ml  Net    480 ml     Diet Order: Renal 60 - 70  Supplements/Tube Feeding: Nepro PO BID  IVF:    Estimated Nutritional Needs:   Kcal:   2000 - 2200 kcal Protein:  85 - 95 grams protein Fluid:  1.2 liters daily  Pt followed by RD during acute hospitalization. Pt was advanced to Renal diet after extubation. Pt refused meals as he does not follow renal diet PTA. Would take Nepro Shake daily, diet was liberalized to Low Sodium. Pt underwent L transmetatarsal amputation on 6/21  Pt is currently ordered for Renal 60 - 70 diet, eating 85% of meals.  Stage II on sacrum. Pt is at nutrition risk given decline in weights, recent poor PO intake, and skin breakdown.  NUTRITION DIAGNOSIS: -Increased nutrient needs (NI-5.1).  Status: Ongoing  RELATED TO: wound healing and HD  AS EVIDENCED BY: estimated needs  MONITORING/EVALUATION(Goals): Goal:  Pt to consume at least 90% of estimated needs Monitor: weights, labs, PO intake, I/O's  EDUCATION NEEDS: -No education needs identified at this time  INTERVENTION: 1. Continue Nepro Shakes daily; this RD brought pt Butter Pecan flavor, per his request 2. Recommend Low Sodium diet, pt does not follow renal restrictions and renal labs are currently WNL 3. RD to continue to follow nutrition care plan  Dietitian #: [phone]  DOCUMENTATION CODES Per approved criteria  -Not Applicable    [name] 09/17/2011, 11:08 AM

## 2011-09-17 NOTE — Plan of Care (Signed)
Problem: RH SKIN INTEGRITY Goal: RH STG SKIN FREE OF INFECTION/BREAKDOWN Outcome: Progressing Wound on sacrum/ no new skin breakdown

## 2011-09-17 NOTE — Progress Notes (Signed)
VASCULAR LAB PRELIMINARY  ARTERIAL  ABI completed: Bilateral non-compressible vessels in lower extremities. Attempted bilateral brachial pressures, which were both over 300 mmHg, which could be falsely elevated due to formerly used fistulas.    RIGHT    LEFT    PRESSURE WAVEFORM  PRESSURE WAVEFORM  BRACHIAL NA Tri BRACHIAL NA Tri  DP   DP    AT NA Damp mono AT NA Mono   PT NA Damp mono PT NA Mono   PER   PER    GREAT TOE  NA GREAT TOE  NA    RIGHT LEFT  ABI NA NA     EUNICE, Refugio Mcconico, RDMS 09/17/2011, 1:35 PM

## 2011-09-17 NOTE — Evaluation (Signed)
Reviewed and in agreement with treatment provided.  

## 2011-09-17 NOTE — Evaluation (Signed)
Occupational Therapy Assessment and Plan & Session Notes  Patient Details  Name: [name] MRN: [id number] Date of Birth: 01-30-1951  OT Diagnosis: acute pain and muscle weakness (generalized) Rehab Potential: Rehab Potential: Good ELOS: 7-10 days   Today's Date: 09/17/2011  ASSESSMENT AND PLAN  Problem List:  Patient Active Problem List  Diagnosis  . INSOMNIA, CHRONIC  . CVA  . Atherosclerosis of native arteries of the extremities with intermittent claudication  . History of tobacco abuse  . Peripheral vascular occlusive disease  . Dyspnea on exertion  . Chronic diastolic CHF (congestive heart failure)  . CKD (chronic kidney disease) stage V requiring chronic dialysis  . Hyperlipidemia  . Hypertension  . End stage renal disease  . Occlusion and stenosis of carotid artery without mention of cerebral infarction  . Peripheral vascular disease, unspecified  . Pleural effusion  . SBO (small bowel obstruction)  . Pneumonia  . C. difficile colitis- recalcitrant  . Atrial fibrillation  . Atrial flutter  . CAD (coronary artery disease)  . Acute respiratory failure with hypoxia  . Septic shock  . Nausea and vomiting  . Constipation  . Hospital-acquired pneumonia  . Metabolic encephalopathy  . Anemia, chronic renal failure - baseline hgb 9-10  . Ischemia of left foot  . Amputation of leg    Past Medical History:  Past Medical History  Diagnosis Date  . Hyperlipidemia   . ESRD (end stage renal disease)     a. MWF dialysis in [place] (followed by Dr. Kristian Covey)  . Arthritis   . Claudication   . Stroke 2005  . Anemia   . Chronic diastolic CHF (congestive heart failure) 07/2010    a. 07/2008 Echo EF 50-55%, mild-mod LVH, Gr 1 DD.  Marland Kitchen Peripheral vascular occlusive disease     a. 07/2011 Periph Angio: No signif Ao-illiac dzs, LCF stenosis, 100% LSFA w recon above knee pop and 3 vessel runoff, 100% RSFA w/ recon above knee --> pending L Fem to below Knee pop bypass.  Marland Kitchen  History of tobacco abuse   . Hypertension     takes Amlodipine,Metoprolol,and Prinivil daily  . Dyspnea on exertion     with exertion   Past Surgical History:  Past Surgical History  Procedure Date  . Arteriovenous graft placement   . Av fistula placement 12/10/2000    Right brachiocephalic arteriovenous   . Hernia repair   . Aortagram 07/29/2011    Abdominal Aortagram  . Cardiac catheterization 08/01/11    Left heart catheterization  . Femoral-popliteal bypass graft 09/09/2011    Procedure: BYPASS GRAFT FEMORAL-POPLITEAL ARTERY;  Surgeon: [name], MD;  Location: [facility] OR;  Service: Vascular;  Laterality: Left;    Assessment & Plan Clinical Impression: [name] is a 61 y.o. right-handed male with history of peripheral vascular disease as well end stage renal disease with hemodialysis. Recently hospitalized for severe sepsis due to C. difficile complicated by respiratory failure and sepsis. He was recently discharged home 08/23/2011. Presented with ischemic left foot on 09/08/2011. He has been followed by vascular surgery with plan for femoropopliteal bypass but delayed due to recent C. difficile. Underwent femoropopliteal bypass 09/09/2011 per Dr. Myra Gianotti. Ongoing ischemic changes to left lower extremity and ultimately underwent left transmetatarsal amputation 09/12/2011 per Dr. Myra Gianotti. Postoperative pain management as well as anemia 7.8. Hemodialysis ongoing as per renal services. Physical therapy evaluation completed 09/13/2011. M.D. has requested physical medicine rehabilitation consult to consider inpatient rehabilitation services. Patient transferred to CIR on  09/16/2011 .    Patient currently requires min with basic self-care skills and IADL secondary to muscle weakness and decreased sitting balance, decreased standing balance, decreased postural control, decreased balance strategies and difficulty maintaining precautions.  Prior to hospitalization, patient could complete ADLs &  IADLs at mod I level. PTA patient was living alone in an apartment. Patient stated his wife lived in a house with a lot of stairs to enter and he had to move out secondary to difficulty with taking stairs. Patient plans to live with his sister when d/c'ed from hospital and sister will be able to provide 24/7 assistance/supervision prn. All Home Living information is information about sisters mobile home.   Patient will benefit from skilled intervention to increase independence with basic self-care skills prior to discharge home with sister.  Anticipate patient will require 24 hour supervision and follow up home health.  OT - End of Session Activity Tolerance: Tolerates 30+ min activity with multiple rests OT Assessment Rehab Potential: Good Barriers to Discharge: Inaccessible home environment;Decreased caregiver support OT Plan OT Frequency: 1-2 X/day, 60-90 minutes Estimated Length of Stay: 7-10 days OT Treatment/Interventions: Balance/vestibular training;Community reintegration;Discharge planning;Disease mangement/prevention;DME/adaptive equipment instruction;Functional mobility training;Neuromuscular re-education;Pain management;Patient/family education;Psychosocial support;Self Care/advanced ADL retraining;Skin care/wound managment;Therapeutic Activities;Splinting/orthotics;Therapeutic Exercise;UE/LE Strength taining/ROM;UE/LE Coordination activities;Wheelchair propulsion/positioning OT Recommendation Follow Up Recommendations: Home health OT Equipment Recommended: 3 in 1 bedside comode Equipment Details: may need a tub transfer bench as well  Precautions/Restrictions  Precautions Precautions: Fall Restrictions Weight Bearing Restrictions: Yes LLE Weight Bearing: Non weight bearing  General Chart Reviewed: Yes Family/Caregiver Present: No  Pain Pain Assessment Pain Assessment: 0-10 Pain Score: 10-Worst pain ever Pain Type: Surgical pain Pain Location: Foot Pain Orientation:  Left Pain Descriptors: Aching;Burning Pain Frequency: Intermittent Pain Onset: On-going Patients Stated Pain Goal: 0 Pain Intervention(s): RN made aware  Home Living/Prior Functioning Home Living Lives With: Alone Available Help at Discharge: Family (patient states he will live w/ sister when he leaves) Type of Home: Mobile home Home Access: Stairs to enter [employer] of Steps: ~ 3 Entrance Stairs-Rails: Can reach both Home Layout: One level Bathroom Shower/Tub: Tub/shower unit;Door [employer] Toilet: Standard Bathroom Accessibility: Yes How Accessible: Accessible via wheelchair;Accessible via walker (per patient report) Home Adaptive Equipment: Shower chair with back IADL History Homemaking Responsibilities: No Prior Function Level of Independence: Independent with basic ADLs;Independent with homemaking with ambulation;Independent with gait;Independent with transfers;Requires assistive device for independence (ex. used a shower chair for showering) Driving: Yes  ADL - See FIM  Vision/Perception  Vision - History Baseline Vision: Wears glasses only for reading Patient Visual Report: No change from baseline Vision - Assessment Eye Alignment: Within Functional Limits Perception Perception: Within Functional Limits Praxis Praxis: Intact   Cognition Overall Cognitive Status: Appears within functional limits for tasks assessed Arousal/Alertness: Awake/alert Orientation Level: Oriented X4 Memory: Appears intact Awareness: Appears intact Problem Solving: Appears intact Safety/Judgment: Impaired  Sensation Sensation Additional Comments: BUEs appear intact Coordination Gross Motor Movements are Fluid and Coordinated: Yes Fine Motor Movements are Fluid and Coordinated: Yes  Trunk/Postural Assessment  Cervical Assessment  Cervical Assessment: Within Functional Limits  Thoracic Assessment  Thoracic Assessment: Within Functional Limits  Lumbar Assessment    Lumbar Assessment: Within Functional Limits  Postural Control  Postural Control: Deficits on evaluation (decreased control in standing)   Balance  Balance  Balance Assessed: Yes  Static Sitting Balance  Static Sitting - Balance Support: Feet supported;Bilateral upper extremity supported  Static Sitting - Level of Assistance: 5: Stand by assistance  Dynamic Sitting Balance  Dynamic Sitting - Balance Support: Feet supported;No upper extremity supported  Dynamic Sitting - Level of Assistance: 5: Stand by assistance  Static Standing Balance  Static Standing - Balance Support: Bilateral upper extremity supported  Static Standing - Level of Assistance: 2: Max assist  Dynamic Standing Balance  Dynamic Standing - Level of Assistance: Not tested (comment)   Extremity Assessment  RUE Assessment  RUE Assessment: Exceptions to [facility] - Patient able to flex RUE shoulder ~100* LUE Assessment  LUE Assessment: Exceptions to [facility]; premorbid status (decreased AROM d/t pain from "arthritis" per pt)  LUE Strength  LUE Overall Strength Comments: Patient able to flex LUE shoulder ~90* and scapula with significant winging during movements RLE Assessment  RLE Assessment: Within Functional Limits  RLE Strength  RLE Overall Strength Comments: Grossly 4 to 4+/5  LLE Assessment  LLE Assessment: Exceptions to [facility] (s/p transmet amputation, ROM WFL)  LLE Strength  LLE Overall Strength Comments: Grossly 4-/5 strength through hip and knee; 2/5 ankle movement  See FIM for current functional status  Refer to Care Plan for Long Term Goals  Recommendations for other services: None  Discharge Criteria: Patient will be discharged from OT if patient refuses treatment 3 consecutive times without medical reason, if treatment goals not met, if there is a change in medical status, if patient makes no progress towards goals or if patient is discharged from hospital.  The above assessment, treatment plan, treatment  alternatives and goals were discussed and mutually agreed upon: by patient  SESSION NOTES  Session #1 [phone] - 60 Minutes Individual Therapy Patient with 10/10 pain in left foot, RN aware Initial 1:1 occupational therapy evaluation completed. Focused skilled intervention on bed mobility, UB/LB bathing & dressing, dynamic sitting balance sitting edge of bed, overall activity tolerance/endurance, and pain management. Aslo educated patient on importance of turning every 2 hours while in bed for pressure relief to sacrum area. At end of session left patient seated edge of bed with breakfast tray, call bell & phone.   Session #2 [id number] - 25 Minutes Individual Therapy No complaints of pain Patient propelled w/c ~15 feet on the way to the ADL apartment and therapist assisted with the rest of the way. In ADL apartment patient engaged in drop arm BSC transfer with mod assist w/c <-> drop arm BSC. Therapist educated, demonstrated, and had patient return demonstrate w/c management & set-up for transfer; patient required min assist to complete. Therapist then took patient to therapy gym for UE exercise on SCIFIT. During exercises on SCIFIT notable winging of scapula on LUE. Therapist then performed some scapular mobs and patient with slight pain around deltoid area. Will continue to assess BUEs. Left patient seated in w/c with call bell & phone within reach.   Anaih Brander 09/17/2011, 8:49 AM

## 2011-09-17 NOTE — Progress Notes (Signed)
Hydrotherapy Note   09/17/11 0900  Subjective Assessment  Subjective Are you guys gonna keep cleaning this up here too?    Patient and Family Stated Goals Get better  Date of Onset (Present on admission)  Evaluation and Treatment  Evaluation and Treatment Procedures Explained to Patient/Family Yes  Evaluation and Treatment Procedures agreed to  Pressure Ulcer 08/27/11 Stage II -  Partial thickness loss of dermis presenting as a shallow open ulcer with a red, pink wound bed without slough.  Date First Assessed/Time First Assessed: 08/27/11 0800   Location: Sacrum  Staging: Stage II -  Partial thickness loss of dermis presenting as a shallow open ulcer with a red, pink wound bed without slough.  State of Healing Early/partial granulation  Site / Wound Assessment Black;Pink;Yellow  % Wound base Red or Granulating 30%  % Wound base Yellow 60%  % Wound base Black 10%  % Wound base Other (Comment) 0%  Peri-wound Assessment Erythema (non-blanchable);Maceration  Wound Length (cm) 8 cm  Wound Width (cm) 4 cm  Wound Depth (cm) 1 cm  Undermining (cm) Undermines 1cm 12:00 to 3:00  Margins Unattacted edges (unapproximated)  Drainage Amount Moderate  Drainage Description Purulent  Treatment Hydrotherapy (Pulse lavage)  Dressing Type ABD;Gauze (Comment);Tape dressing;Barrier Film (skin prep);Moist to dry (Santyl)  Dressing Changed  Hydrotherapy  Pulsed lavage therapy - wound location Sacrum  Pulsed Lavage with Suction (psi) 8 psi  Pulsed Lavage with Suction - Normal Saline Used 1000 mL  Pulsed Lavage Tip Tip with splash shield  Selective Debridement  Selective Debridement - Location sacrum  Selective Debridement - Tools Used Forceps;Scissors  Selective Debridement - Tissue Removed yellow slough  Wound Therapy - Assess/Plan/Recommendations  Wound Therapy - Clinical Statement Slow improvement continues.  Wound Therapy - Functional Problem List Decreased mobility  Factors Delaying/Impairing  Wound Healing Immobility;Multiple medical problems;Polypharmacy  Hydrotherapy Plan Debridement;Dressing change;Patient/family education;Pulsatile lavage with suction  Wound Therapy - Frequency 6X / week  Wound Therapy - Follow Up Recommendations Home health RN  Wound Therapy Goals - Improve the function of patient's integumentary system by progressing the wound(s) through the phases of wound healing by:  Decrease Necrotic Tissue - Progress Progressing toward goal  Increase Granulation Tissue - Progress Progressing toward goal  Goals/treatment plan/discharge plan were made with and agreed upon by patient/family Yes  Time For Goal Achievement 7 days  Wound Therapy - Potential for Goals Good    Daiden Coltrane, PT [phone]

## 2011-09-17 NOTE — Progress Notes (Signed)
[name]  Subjective:  Up in wheelchair with PT.  For dialysis later today   Objective: Vital signs in last 24 hours: Blood pressure 129/73, pulse 68, temperature 98.7 F (37.1 C), temperature source Oral, resp. rate 20, height 5\' 6"  (1.676 m), weight 66.3 kg (146 lb 2.6 oz), SpO2 98.00%.    PHYSICAL EXAM General--awake. alert Chest--clear,  HD tunneled cath in L IJ Heart--no rub Abd--nontender Extr--post op FPBG plus TMA on L, 3 lumen cath removed from R fem vein  Lab Results:   Lab 09/15/11 0657 09/12/11 0410 09/11/11 0410  NA 138 138 135  K 3.9 4.4 4.3  CL 100 102 101  CO2 27 26 27   BUN 21 13 8   CREATININE 5.69* 4.57* 3.41*  ALB -- -- --  GLUCOSE 81 -- --  CALCIUM 8.1* 8.6 8.5  PHOS 4.5 4.3 --     Basename 09/16/11 0510 09/15/11 0657  WBC 8.1 8.4  HGB 8.7* 8.0*  HCT 26.9* 25.1*  PLT 185 179       Assessment/Plan: 1. Sepsis Syndrome with C diff colitis/PNA: Improved - now off PO vanco for C diff.  2. PVD- S/p fempop and mid foot amputation last week. Rehab today. He lives alone. Per [facility]  3. ESRD: continue MWF HD via tunneled cath (apparently no other options?). AVF clotted in R upper arm. Regular dialysis unit is [facility]. Plan for next HD today after PT.  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 Phos is 4.5. 5.. HTN/volume- controlled with HD and meds.  Will decrease amlodipine to 5/d  7. Anemia- hgb 8.7 today. Fe/TIBC 30 % sat, ferritin-2323 on 24 Jun.  On aranesp 100/wk--no IV Fe  8. Dispo-continue rehab.  Back to [place] (home) at time of d/c [facility] dialysis)  LOS: 1 day   Jenifer Struve F 09/17/2011,2:47 PM   .labalb

## 2011-09-17 NOTE — Progress Notes (Signed)
Occupational Therapy Session Note  Patient Details  Name: [name] MRN: [id number] Date of Birth: 08-28-1950  Today's Date: 09/17/2011 Time: 5284-1324 Time Calculation (min): 30 min  Short Term Goals: Week 1:  OT Short Term Goal 1 (Week 1): Short Term Goals = Long Term Goals secondary to ELOS  Skilled Therapeutic Interventions/Progress Updates:    Worked on sit to stand using RW in preparation for LB selfcare tasks and stand pivot transfers.  Pt needs max assist to perform standing with max instructional cueing for hand and foot placement.  Pt with increased difficulty maintaining NWBing over the left foot.  Only able to tolerate standing intervals of 20-40 seconds at a time before his arms and RLE give out.  Performed squat pivot transfer to the right to get from wheelchair to EOB with mod assist in preparation for dialysis.    Therapy Documentation Precautions:  Precautions Precautions: Fall Precaution Comments: s/p L transmet amp (stage III sacral wound) Restrictions Weight Bearing Restrictions: Yes LLE Weight Bearing: Non weight bearing  Vital Signs: Therapy Vitals Temp: 97.1 F (36.2 C) Temp src: Oral Pulse Rate: 63  Resp: 18  BP: 149/75 mmHg Patient Position, if appropriate: Lying Oxygen Therapy SpO2: 98 % O2 Device: None (Room air) Pain: Pain Assessment Pain Assessment: 0-10 Pain Score: 10-Worst pain ever Pain Type: Surgical pain Pain Location: Foot Pain Orientation: Left Pain Descriptors: Aching Pain Frequency: Constant Pain Onset: On-going Patients Stated Pain Goal: 3 Pain Intervention(s): Medication (See eMAR) Multiple Pain Sites: No ADL: See FIM for current functional status  Therapy/Group: Individual Therapy  Silvie Obremski,Grant 09/17/2011, 4:33 PM

## 2011-09-17 NOTE — Progress Notes (Signed)
[facility]patient Rehabilitation Center Individual Statement of Services  Patient Name:  [name]  Date:  09/17/2011  Welcome to the Inpatient Rehabilitation Center.  Our goal is to provide you with an individualized program based on your diagnosis and situation, designed to meet your specific needs.  With this comprehensive rehabilitation program, you will be expected to participate in at least 3 hours of rehabilitation therapies Monday-Friday, with modified therapy programming on the weekends.  Your rehabilitation program will include the following services:  Physical Therapy (PT), Occupational Therapy (OT), Speech Therapy (ST), 24 hour per day rehabilitation nursing, Therapeutic Recreaction (TR), Case Management (RN and Child psychotherapist), Rehabilitation Medicine, Nutrition Services and Pharmacy Services  Weekly team conferences will be held on Tuesdays to discuss your progress.  Your RN Case Designer, television/film set will talk with you frequently to get your input and to update you on team discussions.  Team conferences with you and your family in attendance may also be held.  Expected length of stay: 7-10 days  Overall anticipated outcome: Supervision  Depending on your progress and recovery, your program may change.  Your RN Case Estate agent will coordinate services and will keep you informed of any changes.  Your RN Sports coach and SW names and contact numbers are listed  below.  The following services may also be recommended but are not provided by the Inpatient Rehabilitation Center:   Driving Evaluations  Home Health Rehabiltiation Services  Outpatient Rehabilitatation [facility]  Vocational Rehabilitation   Arrangements will be made to provide these services after discharge if needed.  Arrangements include referral to agencies that provide these services.  Your insurance has been verified to be:  Medicare + Medicaid Your primary doctor is:  Dr Olena Leatherwood  Pertinent  information will be shared with your doctor and your insurance company.  Case Manager: Lutricia Horsfall, [facility] [phone]  Social Worker:  Dossie Der, [place] [phone]  Information discussed with and copy given to patient by: Meryl Dare, 09/17/2011

## 2011-09-17 NOTE — Progress Notes (Signed)
Patient information reviewed and entered into [employer] system by Mega Kinkade, RN, CRRN, PPS Coordinator.  Information including medical coding and functional independence measure will be reviewed and updated through discharge.     Per nursing patient was given "Data Collection Information Summary for Patients in Inpatient Rehabilitation Facilities with attached "Privacy Act Statement-Health Care Records" upon admission.   

## 2011-09-17 NOTE — Evaluation (Signed)
Physical Therapy Assessment and Plan  Patient Details  Name: [name] MRN: [id number] Date of Birth: November 14, 1950  PT Diagnosis: Difficulty walking, Impaired sensation, Muscle weakness and Pain in L foot and sacral wound Rehab Potential: Good ELOS: 7-10 days  Today's Date: 09/17/2011 Time: 1030-1130 Time Calculation (min): 60 min  Problem List:  Patient Active Problem List  Diagnosis  . INSOMNIA, CHRONIC  . CVA  . Atherosclerosis of native arteries of the extremities with intermittent claudication  . History of tobacco abuse  . Peripheral vascular occlusive disease  . Dyspnea on exertion  . Chronic diastolic CHF (congestive heart failure)  . CKD (chronic kidney disease) stage V requiring chronic dialysis  . Hyperlipidemia  . Hypertension  . End stage renal disease  . Occlusion and stenosis of carotid artery without mention of cerebral infarction  . Peripheral vascular disease, unspecified  . Pleural effusion  . SBO (small bowel obstruction)  . Pneumonia  . C. difficile colitis- recalcitrant  . Atrial fibrillation  . Atrial flutter  . CAD (coronary artery disease)  . Acute respiratory failure with hypoxia  . Septic shock  . Nausea and vomiting  . Constipation  . Hospital-acquired pneumonia  . Metabolic encephalopathy  . Anemia, chronic renal failure - baseline hgb 9-10  . Ischemia of left foot  . Amputation of leg    Past Medical History:  Past Medical History  Diagnosis Date  . Hyperlipidemia   . ESRD (end stage renal disease)     a. MWF dialysis in [place] (followed by Dr. Kristian Covey)  . Arthritis   . Claudication   . Stroke 2005  . Anemia   . Chronic diastolic CHF (congestive heart failure) 07/2010    a. 07/2008 Echo EF 50-55%, mild-mod LVH, Gr 1 DD.  Marland Kitchen Peripheral vascular occlusive disease     a. 07/2011 Periph Angio: No signif Ao-illiac dzs, LCF stenosis, 100% LSFA w recon above knee pop and 3 vessel runoff, 100% RSFA w/ recon above knee --> pending L  Fem to below Knee pop bypass.  Marland Kitchen History of tobacco abuse   . Hypertension     takes Amlodipine,Metoprolol,and Prinivil daily  . Dyspnea on exertion     with exertion   Past Surgical History:  Past Surgical History  Procedure Date  . Arteriovenous graft placement   . Av fistula placement 12/10/2000    Right brachiocephalic arteriovenous   . Hernia repair   . Aortagram 07/29/2011    Abdominal Aortagram  . Cardiac catheterization 08/01/11    Left heart catheterization  . Femoral-popliteal bypass graft 09/09/2011    Procedure: BYPASS GRAFT FEMORAL-POPLITEAL ARTERY;  Surgeon: [name], MD;  Location: [facility] OR;  Service: Vascular;  Laterality: Left;    Assessment & Plan Clinical Impression: Patient is a 61 y.o. year old male with history of peripheral vascular disease as well end stage renal disease with hemodialysis. Recently hospitalized for severe sepsis due to C. difficile complicated by respiratory failure and sepsis. He was recently discharged home 08/23/2011. Presented with ischemic left foot on 09/08/2011. He has been followed by vascular surgery with plan for femoropopliteal bypass but delayed due to recent C. difficile. Underwent femoropopliteal bypass 09/09/2011 per Dr. Myra Gianotti. Ongoing ischemic changes to left lower extremity and ultimately underwent left transmetatarsal amputation 09/12/2011 per Dr. Myra Gianotti. Postoperative pain management as well as anemia 7.8. Hemodialysis ongoing as per renal services. Patient admitted to [facility] 09/16/11.  Patient currently requires mod A with mobility secondary to muscle weakness and  NWB status of LLE s/p amputation, decreased cardiorespiratoy endurance and decreased standing balance, decreased postural control and difficulty maintaining precautions.  Prior to hospitalization, patient was independent with mobility and lived with Alone in a home.  Pt reports that his home is fully accessible, with level entry and grab bars in home.  However, pt reports  that he plans to go home with sister who lives in a mobile home with 3 STE.  Discussed d/c options with pt and encouraged pt to consider going home to his accessible house and having his sister come stay with him.  Pt agrees this would be best but needs to talk with his sister.  Patient will benefit from skilled PT intervention to maximize safe functional mobility, minimize fall risk and decrease caregiver burden for planned discharge home with 24 hour supervision.  Anticipate patient will benefit from follow up HH at discharge.  PT - End of Session Activity Tolerance: Decreased this session Endurance Deficit: Yes PT Assessment Rehab Potential: Good Barriers to Discharge: Inaccessible home environment;Decreased caregiver support PT Plan PT Frequency: 1-2 X/day, 60-90 minutes Estimated Length of Stay: 7-10 days PT Treatment/Interventions: Ambulation/gait training;Balance/vestibular training;Community reintegration;Discharge planning;Disease management/prevention;DME/adaptive equipment instruction;Functional mobility training;Pain management;Patient/family education;Psychosocial support;Skin care/wound management;Splinting/orthotics;Stair training;Therapeutic Activities;Therapeutic Exercise;UE/LE Strength taining/ROM;UE/LE Coordination activities;Wheelchair propulsion/positioning PT Recommendation Follow Up Recommendations: Home health PT Equipment Recommended: Wheelchair (measurements);Wheelchair cushion (measurements)  PT Evaluation Precautions/Restrictions Precautions Precautions: Fall Precaution Comments: s/p L transmet amp (stage III sacral wound) Restrictions Weight Bearing Restrictions: Yes LLE Weight Bearing: Non weight bearing Pain Pt reports 10/10 pain in L foot and sacral wound but was able to tolerate through therapy- premedicated. Home Living/Prior Functioning Home Living Lives With: Alone Available Help at Discharge: Family (pt reports he will live w/ sister upon d/c) Type  of Home: House Home Access: Level entry Home Layout: One level Bathroom Accessibility: Yes How Accessible: Accessible via walker;Accessible via wheelchair Home Adaptive Equipment: Straight cane Additional Comments: pt's home is level entry and accessible.  Reports he may live in sisters mobile home (3 STE with bilateral rails) for assistance Prior Function Level of Independence: Independent with basic ADLs;Independent with homemaking with ambulation;Independent with homemaking with wheelchair Able to Take Stairs?: No Driving: No Vision/Perception  Vision - History Baseline Vision: Wears glasses only for reading Perception Perception: Within Functional Limits Praxis Praxis: Intact  Cognition Overall Cognitive Status: Appears within functional limits for tasks assessed Arousal/Alertness: Awake/alert Orientation Level: Oriented X4 Memory: Appears intact Awareness: Appears intact Problem Solving: Appears intact Safety/Judgment: Impaired Comments: pt requires frequent cues to maintain NWB status as he wants to rest it on RLE or floor Sensation Sensation Light Touch: Impaired by gross assessment (diminished/absent LT of L foot (general)) Proprioception: Impaired by gross assessment (difficulty maintaining NWB status LLE) Coordination Gross Motor Movements are Fluid and Coordinated: Yes Fine Motor Movements are Fluid and Coordinated: Yes Motor  Motor Motor: Abnormal postural alignment and control (NWB LLE)  Locomotion  Ambulation Ambulation: No Ambulation/Gait Assistance:  (Max A for sit to stand in RW, unable to assume upright postu) Gait Gait: No Wheelchair Mobility Wheelchair Mobility: Yes Wheelchair Assistance: 3: Advertising copywriter Assistance Details: Verbal cues for technique;Verbal cues for sequencing;Verbal cues for precautions/safety;Verbal cues for gait pattern;Verbal cues for safe use of DME/AE Wheelchair Propulsion: Both upper extremities Wheelchair Parts  Management: Needs assistance Distance: 20'  Trunk/Postural Assessment  Cervical Assessment Cervical Assessment: Within Functional Limits Thoracic Assessment Thoracic Assessment: Within Functional Limits Lumbar Assessment Lumbar Assessment: Within Functional Limits Postural Control Postural Control:  Deficits on evaluation (decreased control in standing)  Balance Balance Balance Assessed: Yes Static Sitting Balance Static Sitting - Balance Support: Feet supported;Bilateral upper extremity supported Static Sitting - Level of Assistance: 5: Stand by assistance Dynamic Sitting Balance Dynamic Sitting - Balance Support: Feet supported;No upper extremity supported Dynamic Sitting - Level of Assistance: 5: Stand by assistance Static Standing Balance Static Standing - Balance Support: Bilateral upper extremity supported Static Standing - Level of Assistance: 2: Max assist Dynamic Standing Balance Dynamic Standing - Level of Assistance: Not tested (comment) Extremity Assessment  RUE Assessment RUE Assessment: Within Functional Limits LUE Assessment LUE Assessment: Exceptions to WFL (decreased AROM d/t pain from "arthritis" per pt) LUE Strength LUE Overall Strength Comments: grossly 3+/5 shoulder flex, abd.  limited d/t pain RLE Assessment RLE Assessment: Within Functional Limits RLE Strength RLE Overall Strength Comments: Grossly 4 to 4+/5 LLE Assessment LLE Assessment: Exceptions to [facility] (s/p transmet amputation, ROM WFL) LLE Strength LLE Overall Strength Comments: Grossly 4-/5 strength through hip and knee; 2/5 ankle movement  Skilled Therapy Session 1: Tx focused on bed mobility and  transfers from bed<>w/c for proper technique.  Pt requires Mod A in order to lift buttocks off surface while scooting and prevent further skin breakdown.  Educated pt on the need to ensure lifting while transferring. Educated pt on w/c parts (including special cushion for pressure relief for sacral  ulcer), management, and mobility.  Pt self-propelled w/c for improved w/c mobility and UE strengthening.  Practiced sit>stands(x 2 reps) from mat with RW with patient requiring Max A to stand.  Pt maintains trunk flexion due to generalized weakness in UE/LE and decreased balance, needing to sit before reaching terminal extension in standing.  Pt requires frequent cueing to maintain NWB of LLE.  Pt returned to bed and educated on side lying to prevent further skin breakdown of sacral ulcer.  Resting SpO2= 82% and was able to increase to 93% with education on pursed lip breathing.  HR=69bpm following supine>sit transfer.  See FIM for current functional status  Refer to Care Plan for Long Term Goals  Recommendations for other services: None  Discharge Criteria: Patient will be discharged from PT if patient refuses treatment 3 consecutive times without medical reason, if treatment goals not met, if there is a change in medical status, if patient makes no progress towards goals or if patient is discharged from hospital.  The above assessment, treatment plan, treatment alternatives and goals were discussed and mutually agreed upon: by patient    Skilled Therapy Session #2: Time: 1300-1330 (30 min)  Individual therapy session.  Pt reports 10/10 pain in L foot with no relief from pain medication.  Pt premedicated.  Pain does not limit pt's ability to participate in therapy however and patient does not grimace or report pain with tasks.  Tx focused on w/c mobility, endurance, and UE strengthening with self-propulsion of w/c.  Pt self-propelled w/c ~120' with the need for 7 rest breaks due to limited endurance and activity tolerance.   Educated pt on w/c parts and management, efficient propulsion, and pressure relief while sitting in chair.  Pt required frequent cueing for bilateral UE use.  Pt left in room seated in w/c with call bell in reach in preparation for OT session in 15 minutes.  Deirdre Pippins 09/17/2011, 12:56 PM

## 2011-09-17 NOTE — Progress Notes (Signed)
Patient ID: [name], male   DOB: 07-Apr-1950, 61 y.o.   MRN: [id number] Subjective/Complaints: Left foot is sore. Was able to sleep. Other ros items reviewed and otherwise negative  Objective: Vital Signs: Blood pressure 129/73, pulse 68, temperature 98.7 F (37.1 C), temperature source Oral, resp. rate 20, height 5\' 6"  (1.676 m), weight 66.3 kg (146 lb 2.6 oz), SpO2 98.00%. No results found.  Basename 09/16/11 0510 09/15/11 0657  WBC 8.1 8.4  HGB 8.7* 8.0*  HCT 26.9* 25.1*  PLT 185 179    Basename 09/15/11 0657  NA 138  K 3.9  CL 100  CO2 27  GLUCOSE 81  BUN 21  CREATININE 5.69*  CALCIUM 8.1*   CBG (last 3)  No results found for this basename: GLUCAP:3 in the last 72 hours  Wt Readings from Last 3 Encounters:  09/17/11 66.3 kg (146 lb 2.6 oz)  09/15/11 61.6 kg (135 lb 12.9 oz)  09/15/11 61.6 kg (135 lb 12.9 oz)    Physical Exam:  General appearance: alert, cooperative and no distress Head: Normocephalic, without obvious abnormality, atraumatic Eyes: conjunctivae/corneas clear. PERRL, EOM's intact. Fundi benign. Ears: normal TM's and external ear canals both ears Nose: Nares normal. Septum midline. Mucosa normal. No drainage or sinus tenderness. Throat: lips, mucosa, and tongue normal; teeth and gums normal Neck: no adenopathy, no carotid bruit, no JVD, supple, symmetrical, trachea midline and thyroid not enlarged, symmetric, no tenderness/mass/nodules Back: symmetric, no curvature. ROM normal. No CVA tenderness. Resp: clear to auscultation bilaterally Cardio: regular rate and rhythm, S1, S2 normal, no murmur, click, rub or gallop GI: soft, non-tender; bowel sounds normal; no masses,  no organomegaly Extremities: extremities normal, atraumatic, no cyanosis or edema Pulses: 2+ and symmetric Skin: Skin color, texture, turgor normal. No rashes or lesions Neurologic: Grossly normal- short term memory deficits. Non-focal weakness except for pain inhibition in the  left lef. Incision/Wound: left foot incision with serosanginous drainage. Wound fairly well approximated. The remainder of the foot is intact. Right 1st toe with a 1 inch area c/w with early breakdown/bloood blister   Assessment/Plan: 1. Functional deficits secondary to left TMA/BPG with deconditioning  which require 3+ hours per day of interdisciplinary therapy in a comprehensive inpatient rehab setting. Physiatrist is providing close team supervision and 24 hour management of active medical problems listed below. Physiatrist and rehab team continue to assess barriers to discharge/monitor patient progress toward functional and medical goals. FIM:                   Comprehension Comprehension Mode: Auditory Comprehension: 5-Understands basic 90% of the time/requires cueing < 10% of the time  Expression Expression Mode: Verbal Expression: 5-Expresses basic needs/ideas: With no assist  Social Interaction Social Interaction: 6-Interacts appropriately with others with medication or extra time (anti-anxiety, antidepressant).  Problem Solving Problem Solving: 5-Solves basic 90% of the time/requires cueing < 10% of the time  Memory Memory: 5-Recognizes or recalls 90% of the time/requires cueing < 10% of the time   LOS (Days) 1 A FACE TO FACE EVALUATION WAS PERFORMED 1. Left transmetatarsal amputation/femoropopliteal bypass secondary to peripheral vascular disease  2. DVT Prophylaxis/Anticoagulation: SCDs to right lower extremity  3. Pain Management: Neurontin, Percocet. Monitor with increased mobility  4. Neuropsych: This patient is capable of making decisions on his/her own behalf.  5. End stage renal disease. Continue dialysis as per [facility]. HD at the end of therapy day.  6. Hypertension. Norvasc 10 mg daily, Imdur 30 mg  daily, Lopressor 100 mg twice a day. Monitor with increased mobility  7. Hyperlipidemia. Zocor  8. History congestive heart failure.  Monitor for any signs of fluid overload  9. Chronic anemia. Aranesp. Followup CBC with dialysis 10. Wound- petroleum dressing, 4x4, kerlix, ACE daily.  Olivya Sobol T 09/17/2011, 7:55 AM

## 2011-09-17 NOTE — Procedures (Signed)
Patient was seen on dialysis and the procedure was supervised.  BFR 400  Via PC BP is  92/44.   Patient appears to be tolerating treatment well  Devaeh Amadi A 09/17/2011

## 2011-09-18 ENCOUNTER — Encounter (HOSPITAL_COMMUNITY): Payer: Self-pay | Admitting: Surgery

## 2011-09-18 MED ORDER — HEPARIN SODIUM (PORCINE) 1000 UNIT/ML DIALYSIS
20.0000 [IU]/kg | INTRAMUSCULAR | Status: DC | PRN
  Administered 2011-09-19: 1300 [IU] via INTRAVENOUS_CENTRAL
  Filled 2011-09-18: qty 2

## 2011-09-18 MED ORDER — DARBEPOETIN ALFA-POLYSORBATE 150 MCG/0.3ML IJ SOLN
150.0000 ug | INTRAMUSCULAR | Status: DC
  Administered 2011-09-24: 150 ug via INTRAVENOUS
  Filled 2011-09-18: qty 0.3

## 2011-09-18 MED ORDER — ALUMINUM HYDROXIDE GEL 320 MG/5ML PO SUSP
10.0000 mL | Freq: Four times a day (QID) | ORAL | Status: DC | PRN
  Administered 2011-09-19 – 2011-09-26 (×5): 10 mL via ORAL
  Filled 2011-09-18 (×7): qty 30

## 2011-09-18 NOTE — Plan of Care (Signed)
Problem: RH SKIN INTEGRITY Goal: RH STG ABLE TO PERFORM INCISION/WOUND CARE W/ASSISTANCE STG Able To Perform Incision/Wound Care With Total Assistance.  Outcome: Progressing Pt receiving hydrotherapy

## 2011-09-18 NOTE — Plan of Care (Signed)
Problem: RH PAIN MANAGEMENT Goal: RH STG PAIN MANAGED AT OR BELOW PT'S PAIN GOAL Pain level 7 or less  Outcome: Not Progressing Stage IV pressure ulcer, requiring hydrotherapy at this time.

## 2011-09-18 NOTE — Progress Notes (Signed)
Hydrotherapy Note   09/18/11 1200  Subjective Assessment  Subjective They made me sore yesterday.    Evaluation and Treatment  Evaluation and Treatment Procedures Explained to Patient/Family Yes  Evaluation and Treatment Procedures agreed to  Pressure Ulcer 08/27/11 Stage II -  Partial thickness loss of dermis presenting as a shallow open ulcer with a red, pink wound bed without slough.  Date First Assessed/Time First Assessed: 08/27/11 0800   Location: Sacrum  Staging: Stage II -  Partial thickness loss of dermis presenting as a shallow open ulcer with a red, pink wound bed without slough.  State of Healing Early/partial granulation  Site / Wound Assessment Pink;Yellow  Drainage Amount Minimal  Drainage Description Purulent  Treatment Hydrotherapy (Pulse lavage)  Dressing Type ABD;Barrier Film (skin prep);Moist to dry (Santyl)  Dressing Changed  Hydrotherapy  Pulsed lavage therapy - wound location Sacrum  Pulsed Lavage with Suction (psi) 8 psi  Pulsed Lavage with Suction - Normal Saline Used 1000 mL  Pulsed Lavage Tip Tip with splash shield  Selective Debridement  Selective Debridement - Location sacrum  Selective Debridement - Tools Used Forceps;Scissors  Selective Debridement - Tissue Removed yellow slough  Wound Therapy - Assess/Plan/Recommendations  Wound Therapy - Clinical Statement Slow improvement continues.  Wound Therapy - Functional Problem List Decreased mobility  Factors Delaying/Impairing Wound Healing Immobility;Multiple medical problems;Polypharmacy  Hydrotherapy Plan Debridement;Dressing change;Patient/family education;Pulsatile lavage with suction  Wound Therapy - Frequency 6X / week  Wound Therapy - Follow Up Recommendations Home health RN  Wound Therapy Goals - Improve the function of patient's integumentary system by progressing the wound(s) through the phases of wound healing by:  Decrease Necrotic Tissue - Progress Progressing toward goal  Increase  Granulation Tissue - Progress Progressing toward goal    Mack Hook, PT [phone]

## 2011-09-18 NOTE — Progress Notes (Signed)
Occupational Therapy Session Note  Patient Details  Name: [name] MRN: [id number] Date of Birth: 03/14/1951  Today's Date: 09/18/2011 Time: 1478-2956 Time Calculation (min): 15 min  Short Term Goals: Week 1:  OT Short Term Goal 1 (Week 1): Short Term Goals = Long Term Goals secondary to ELOS  Skilled Therapeutic Interventions/Progress Updates:  1330-1345 - 15 Minutes Amount of Missed OT Time (min): 30 Minutes - see below for reason Patient with excruciating pain in buttock area and pain in left foot, no rate given; RN aware Upon entering room patient laying on left side in bed with head-pillow half off bed rail and asleep. Woke patient and encouraged patient to participate in therapy, patient refused to get out of bed for therapy, "I've already been up today and went to the gym, I'm not getting up again!". Engaged in bed mobility for re-positioning, side lying on right side and performed some scapular mobilization -> left shoulder. During mobs patient with complaints of pain -> left shoulder. RN notified of refusal and pain complaints. Left patient in right side lying with call bell & phone within reach. Patient also refused to eat lunch at this time.  Precautions:  Precautions Precautions: Fall Precaution Comments: s/p L transmet amp (stage III sacral wound) Restrictions Weight Bearing Restrictions: Yes LLE Weight Bearing: Non weight bearing  See FIM for current functional status  Therapy/Group: Individual Therapy  [name] 09/18/2011, 1:48 PM

## 2011-09-18 NOTE — Progress Notes (Signed)
Physical Therapy Session Note  Patient Details  Name: [name] MRN: [id number] Date of Birth: 06/07/1950  Today's Date: 09/18/2011 Time: 1005-1032 Time Calculation (min): 27 min  Short Term Goals: Week 1:  PT Short Term Goal 1 (Week 1): Pt will maintain NWB status of LLE 75% of the time without cueing. PT Short Term Goal 2 (Week 1): Pt will perform supine<>sit transfers with Min A. PT Short Term Goal 3 (Week 1): Pt will perform w/c<>bed transfers with Min A.  Skilled Therapeutic Interventions/Progress Updates:   Attempted using knee walker to stand x 2 with mod@, pt unable to tolerate due to 10/10 pain in sacral wound with attempts.  Performed 3 min of Nustep exercises, not using the LLE with 4 rest breaks.   W/c propulsion 30' x 3, pt needing to rest between each trial.  Pt with significantly low endurance and limited by pain to progress mobility.  Therapy Documentation Precautions:  Precautions Precautions: Fall Precaution Comments: s/p L transmet amp (stage III sacral wound) Restrictions Weight Bearing Restrictions: Yes LLE Weight Bearing: Non weight bearing Pain: Pain Assessment Pain Assessment: 0-10 Pain Score: 10-Worst pain ever (all meds given, pain at sacral wound area.)  See FIM for current functional status  Therapy/Group: Individual Therapy  Georges Mouse 09/18/2011, 10:49 AM

## 2011-09-18 NOTE — Plan of Care (Signed)
Overall Plan of Care [facility]) Patient Details Name: [name] MRN: [id number] DOB: 1951/02/07  Diagnosis:  Left TMA  Primary Diagnosis:    Amputation of leg Co-morbidities: ESRD, DM, HTN, wounds  Functional Problem List  Patient demonstrates impairments in the following areas: Balance, Bladder, Bowel, Edema, Endurance, Pain, Safety, Sensory  and Skin Integrity  Basic ADL's: grooming, bathing, dressing and toileting Advanced ADL's: light housekeeping  Transfers:  bed mobility, bed to chair, toilet, tub/shower, car and furniture Locomotion:  ambulation, wheelchair mobility and stairs  Additional Impairments:  Leisure Awareness  Anticipated Outcomes Item Anticipated Outcome  Eating/Swallowing    Basic self-care  Overall supervision  Tolieting  Supervision   Bowel/Bladder  Mod I  Transfers  Supervision overall; min A car  Locomotion  Mod I w/c mobility  Communication    Cognition    Pain    Safety/Judgment    Other     Therapy Plan: PT Frequency: 1-2 X/day, 60-90 minutes OT Frequency: 1-2 X/day, 60-90 minutes     Team Interventions: Item RN PT OT SLP SW TR Other  Self Care/Advanced ADL Retraining  x x      Neuromuscular Re-Education  x x      Therapeutic Activities  x x      UE/LE Strength Training/ROM  x x      UE/LE Coordination Activities  x x      Visual/Perceptual Remediation/Compensation         DME/Adaptive Equipment Instruction  x x      Therapeutic Exercise  x x      Balance/Vestibular Training  x x      Patient/Family Education  x x      Cognitive Remediation/Compensation         Functional Mobility Training  x x      Ambulation/Gait Training  x x      Stair Training  x       Wheelchair Propulsion/Positioning  x x      Functional Tourist information centre manager Reintegration  x x      Dysphagia/Aspiration Film/video editor         Bladder Management         Bowel Management x          Disease Management/Prevention x x x      Pain Management x x x      Medication Management x        Skin Care/Wound Management x x x      Splinting/Orthotics  x x      Discharge Planning x x x      Psychosocial Support x x x                         Team Discharge Planning: Destination:  Home (pt's home v sister's house) Projected Follow-up:  PT, OT and Home Health Projected Equipment Needs:  Wheelchair Patient/family involved in discharge planning:  Yes  MD ELOS: 7-10 days Medical Rehab Prognosis:  Excellent Assessment: Pt has been admitted for CIR therapies. The team will be addressing self-care, fxnl mobility including wheel chair use, safety, adaptive equipment, wound care, pain. Goals are Mod I to supervision.

## 2011-09-18 NOTE — Progress Notes (Addendum)
[facility]  Subjective:  In bed.  Complains of pain in stump   Objective: Vital signs in last 24 hours: Blood pressure 153/74, pulse 67, temperature 98.5 F (36.9 C), temperature source Oral, resp. rate 18, height 5\' 6"  (1.676 m), weight 66.5 kg (146 lb 9.7 oz), SpO2 96.00%.    PHYSICAL EXAM General--awake. alert Chest--cath L IJ, clear Heart--no rub Abd--nontender Extr--post op L FPBG + L TMA  Lab Results:   Lab 09/17/11 1600 09/15/11 0657 09/12/11 0410  NA 133* 138 138  K 4.2 3.9 4.4  CL 96 100 102  CO2 25 27 26   BUN 22 21 13   CREATININE 4.97* 5.69* 4.57*  ALB -- -- --  GLUCOSE 86 -- --  CALCIUM 8.7 8.1* 8.6  PHOS 4.7* 4.5 4.3     Basename 09/17/11 1600 09/16/11 0510  WBC 7.8 8.1  HGB 8.5* 8.7*  HCT 26.8* 26.9*  PLT 217 185    Assessment/Plan: 1. Sepsis Syndrome with C diff colitis/PNA: Improved - now off PO vanco for C diff.  2. PVD- S/p fempop and mid foot amputation last week. Rehab today. He lives alone. Per [facility]  3. ESRD: continue MWF HD via tunneled cath (apparently no other options?). AVF clotted in R upper arm. Regular dialysis unit is [facility]. Plan for next HD tomorrow  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 Phos is 4.7.  5.. HTN/volume- controlled with HD and meds. BP OK on amlodipine  10/d (dose was not decreased to 5/d yesterday) 7. Anemia- hgb 8.7 yesterday. Fe/TIBC 30 % sat, ferritin-2323 on 24 Jun. On aranesp 100/wk--no IV Fe .  Increase aranesp to 150/wk 8. Dispo-continue rehab. Back to [place] (home) at time of d/c [facility] dialysis     LOS: 2 days   Shandon Matson F 09/18/2011,2:01 PM   .labalb

## 2011-09-18 NOTE — Discharge Summary (Signed)
Agree with above  [name] 

## 2011-09-18 NOTE — Progress Notes (Signed)
Alert and orientated. Anuria, HD-M, W, F. Stage IV pressure ulcer with tunneling at 12 o'clock and 3 o'clock, per report. Receiving daily hydrotherapy, and on air mattress overlay. L foot with amputed toes. Dressing to L foot  reinforced. L femoral and popliteal incisions healing appropriately. Pain poorly controlled with prn pain medication. Pt reports 10/10 constantly for reassessment of pain.. R great toe with eschar to posterior area. Last bowel movement 09/14/11, per report.

## 2011-09-18 NOTE — Progress Notes (Signed)
Physical Therapy Session Note  Patient Details  Name: [name] MRN: [id number] Date of Birth: 06/07/1950  Today's Date: 09/18/2011 Time: 0940-1005 Time Calculation (min): 25 min  Short Term Goals: Week 1:  PT Short Term Goal 1 (Week 1): Pt will maintain NWB status of LLE 75% of the time without cueing. PT Short Term Goal 2 (Week 1): Pt will perform supine<>sit transfers with Min A. PT Short Term Goal 3 (Week 1): Pt will perform w/c<>bed transfers with Min A.  Skilled Therapeutic Interventions/Progress Updates:    Assisted pt donning pants in bed, all mobility required increased time. Pt states he is sore all over from working out but denied specific pain. Nurse informed and pt requesting tylenol, was premedicated prior to hydrotherapy. Transfer with instruction squat pivot bed to chair to L min A.  Propelled w/c to gym 160 ft with 5 brief rest breaks d/t c/o overall and UE fatigue min A around obstacles but S in open hallway. Therapy Documentation Precautions:  Precautions Precautions: Fall Precaution Comments: s/p L transmet amp (stage III sacral wound) Restrictions Weight Bearing Restrictions: Yes LLE Weight Bearing: Non weight bearing G   See FIM for current functional status  Therapy/Group: Individual Therapy  Michaelene Song 09/18/2011, 10:14 AM

## 2011-09-18 NOTE — Progress Notes (Signed)
Occupational Therapy Session Note  Patient Details  Name: [name] MRN: [id number] Date of Birth: 02-22-1951  Today's Date: 09/18/2011 Time: 5621-3086 Time Calculation (min): 45 min  Short Term Goals: Week 1:  OT Short Term Goal 1 (Week 1): Short Term Goals = Long Term Goals secondary to ELOS  Skilled Therapeutic Interventions/Progress Updates:    Pt resting inn w/c and agreeable to participating in bathing and dressing tasks w/c level at sink.  Pt stated he wasn't able to do much because he was in so much pain (10/10) in his left buttocks and left foot.  He also stated he wouldn't be able to have any more pain meds until 12 noon.  Pt required increased assistance with bathing and dressing tasks.  Pt required mod A for sit to stand at sink and unable to use UE to assist with bathing buttocks or pull up pants.  Pt stated "I could do more if I didn't hurt so much."  Focus on activity tolerance and standing balance.  Pt requested to return to bed upon completion of session.  Pt performed squat pivot transfer with max assist.  Therapy Documentation Precautions:  Precautions Precautions: Fall Precaution Comments: s/p L transmet amp (stage III sacral wound) Restrictions Weight Bearing Restrictions: Yes LLE Weight Bearing: Non weight bearing   Pain: Pain Assessment Pain Assessment: 0-10 Pain Score: 10-Worst pain ever Pain Type: Acute pain Pain Location: Buttocks Pain Orientation: Left Pain Descriptors: Aching Pain Onset: On-going Patients Stated Pain Goal: 2 Pain Intervention(s): RN made aware 2nd Pain Site Pain Score: 10 Pain Location: Foot Pain Orientation: Left Patient's Stated Pain Goal: 2 Pain Intervention(s): RN made aware  See FIM for current functional status  Therapy/Group: Individual Therapy  Rich Brave 09/18/2011, 12:11 PM

## 2011-09-18 NOTE — Progress Notes (Signed)
Patient ID: ELIM ECONOMOU, male   DOB: 24-Mar-1951, 61 y.o.   MRN: [id number] Patient ID: ALEXA GOLEBIEWSKI, male   DOB: 14-Jul-1950, 61 y.o.   MRN: [id number] Subjective/Complaints: Slept well. Other ros items reviewed and otherwise negative  Objective: Vital Signs: Blood pressure 153/74, pulse 67, temperature 98.5 F (36.9 C), temperature source Oral, resp. rate 18, height 5\' 6"  (1.676 m), weight 66.5 kg (146 lb 9.7 oz), SpO2 96.00%. No results found.  Basename 09/17/11 1600 09/16/11 0510  WBC 7.8 8.1  HGB 8.5* 8.7*  HCT 26.8* 26.9*  PLT 217 185    Basename 09/17/11 1600  NA 133*  K 4.2  CL 96  CO2 25  GLUCOSE 86  BUN 22  CREATININE 4.97*  CALCIUM 8.7   CBG (last 3)  No results found for this basename: GLUCAP:3 in the last 72 hours  Wt Readings from Last 3 Encounters:  09/18/11 66.5 kg (146 lb 9.7 oz)  09/15/11 61.6 kg (135 lb 12.9 oz)  09/15/11 61.6 kg (135 lb 12.9 oz)    Physical Exam:  General appearance: alert, cooperative and no distress Head: Normocephalic, without obvious abnormality, atraumatic Eyes: conjunctivae/corneas clear. PERRL, EOM's intact. Fundi benign. Ears: normal TM's and external ear canals both ears Nose: Nares normal. Septum midline. Mucosa normal. No drainage or sinus tenderness. Throat: lips, mucosa, and tongue normal; teeth and gums normal Neck: no adenopathy, no carotid bruit, no JVD, supple, symmetrical, trachea midline and thyroid not enlarged, symmetric, no tenderness/mass/nodules Back: symmetric, no curvature. ROM normal. No CVA tenderness. Resp: clear to auscultation bilaterally Cardio: regular rate and rhythm, S1, S2 normal, no murmur, click, rub or gallop GI: soft, non-tender; bowel sounds normal; no masses,  no organomegaly Extremities: extremities normal, atraumatic, no cyanosis or edema Pulses: 2+ and symmetric Skin: Skin color, texture, turgor normal. No rashes or lesions Neurologic: Grossly normal- short term memory deficits.  Non-focal weakness except for pain inhibition in the left lef. Incision/Wound: left foot incision with serosanginous drainage. Wound fairly well approximated. The remainder of the foot is intact. Right 1st toe with a 1 inch area c/w with early breakdown/bloood blister. Stage II sacral wound.   Assessment/Plan: 1. Functional deficits secondary to left TMA/BPG with deconditioning  which require 3+ hours per day of interdisciplinary therapy in a comprehensive inpatient rehab setting. Physiatrist is providing close team supervision and 24 hour management of active medical problems listed below. Physiatrist and rehab team continue to assess barriers to discharge/monitor patient progress toward functional and medical goals. FIM: FIM - Bathing Bathing Steps Patient Completed: Chest;Right Arm;Left Arm;Abdomen;Front perineal area;Right upper leg;Left upper leg;Right lower leg (including foot) Bathing: 4: Min-Patient completes 8-9 52f 10 parts or 75+ percent  FIM - Upper Body Dressing/Undressing Upper body dressing/undressing steps patient completed: Thread/unthread right sleeve of pullover shirt/dresss;Thread/unthread left sleeve of pullover shirt/dress;Put head through opening of pull over shirt/dress;Pull shirt over trunk Upper body dressing/undressing: 4: Steadying assist FIM - Lower Body Dressing/Undressing Lower body dressing/undressing steps patient completed: Thread/unthread right pants leg;Thread/unthread left pants leg;Don/Doff right sock Lower body dressing/undressing: 4: Min-Patient completed 75 plus % of tasks  FIM - Toileting Toileting: 0: Activity did not occur  FIM - Archivist Transfers: 3-To toilet/BSC: Mod A (lift or lower assist);3-From toilet/BSC: Mod A (lift or lower assist)  FIM - Bed/Chair Transfer Bed/Chair Transfer Assistive Devices: Arm rests Bed/Chair Transfer: 3: Supine > Sit: Mod A (lifting assist/Pt. 50-74%/lift 2 legs;3: Bed > Chair or W/C: Mod A (lift or  lower assist)  FIM - Locomotion: Wheelchair Distance: 120' Locomotion: Wheelchair: 2: Travels 50 - 149 ft with minimal assistance (Pt.>75%) FIM - Locomotion: Ambulation Locomotion: Ambulation Assistive Devices: Designer, industrial/product Ambulation/Gait Assistance:  (Max A for sit to stand in RW, unable to assume upright postu) Locomotion: Ambulation: 0: Activity did not occur (pt req max A for sit>stand in RW, unsafe to amb)  Comprehension Comprehension Mode: Auditory Comprehension: 5-Follows basic conversation/direction: With no assist  Expression Expression Mode: Verbal Expression: 5-Expresses basic needs/ideas: With no assist  Social Interaction Social Interaction: 5-Interacts appropriately 90% of the time - Needs monitoring or encouragement for participation or interaction.  Problem Solving Problem Solving: 5-Solves basic problems: With no assist  Memory Memory: 6-More than reasonable amt of time   LOS (Days) 2 A FACE TO FACE EVALUATION WAS PERFORMED 1. Left transmetatarsal amputation/femoropopliteal bypass secondary to peripheral vascular disease  2. DVT Prophylaxis/Anticoagulation: SCDs to right lower extremity  3. Pain Management: Neurontin, Percocet. Monitor with increased mobility  4. Neuropsych: This patient is capable of making decisions on his/her own behalf.  5. End stage renal disease. Continue dialysis as per [facility]. HD at the end of therapy day.  6. Hypertension. Norvasc 10 mg daily, Imdur 30 mg daily, Lopressor 100 mg twice a day. Monitor with increased mobility  7. Hyperlipidemia. Zocor  8. History congestive heart failure. Monitor for any signs of fluid overload  9. Chronic anemia. Aranesp. Followup CBC with dialysis 10. Wound- petroleum dressing, 4x4, kerlix, ACE daily.   -Hydrotherapy to sacral wound.  Nonnie Pickney T 09/18/2011, 7:18 AM

## 2011-09-19 ENCOUNTER — Inpatient Hospital Stay (HOSPITAL_COMMUNITY): Payer: Medicare Other

## 2011-09-19 DIAGNOSIS — L98499 Non-pressure chronic ulcer of skin of other sites with unspecified severity: Secondary | ICD-10-CM

## 2011-09-19 DIAGNOSIS — E1165 Type 2 diabetes mellitus with hyperglycemia: Secondary | ICD-10-CM

## 2011-09-19 DIAGNOSIS — I739 Peripheral vascular disease, unspecified: Secondary | ICD-10-CM

## 2011-09-19 DIAGNOSIS — N186 End stage renal disease: Secondary | ICD-10-CM

## 2011-09-19 DIAGNOSIS — S98919A Complete traumatic amputation of unspecified foot, level unspecified, initial encounter: Secondary | ICD-10-CM

## 2011-09-19 DIAGNOSIS — Z5189 Encounter for other specified aftercare: Secondary | ICD-10-CM

## 2011-09-19 MED ORDER — DOXERCALCIFEROL 4 MCG/2ML IV SOLN
INTRAVENOUS | Status: AC
  Administered 2011-09-19: 6 ug via INTRAVENOUS
  Filled 2011-09-19: qty 4

## 2011-09-19 MED ORDER — OXYCODONE-ACETAMINOPHEN 5-325 MG PO TABS
1.0000 | ORAL_TABLET | ORAL | Status: DC | PRN
  Administered 2011-09-19 – 2011-09-20 (×4): 1 via ORAL
  Filled 2011-09-19 (×3): qty 1

## 2011-09-19 MED ORDER — OXYCODONE-ACETAMINOPHEN 5-325 MG PO TABS
ORAL_TABLET | ORAL | Status: AC
  Administered 2011-09-19: 1 via ORAL
  Filled 2011-09-19: qty 1

## 2011-09-19 MED ORDER — CARBAMIDE PEROXIDE 6.5 % OT SOLN
5.0000 [drp] | Freq: Two times a day (BID) | OTIC | Status: AC
  Administered 2011-09-19 – 2011-09-21 (×6): 5 [drp] via OTIC
  Filled 2011-09-19: qty 15

## 2011-09-19 MED ORDER — OXYCODONE-ACETAMINOPHEN 5-325 MG PO TABS
1.0000 | ORAL_TABLET | Freq: Every day | ORAL | Status: DC
  Administered 2011-09-19 – 2011-09-20 (×2): 1 via ORAL
  Filled 2011-09-19 (×2): qty 1

## 2011-09-19 MED ORDER — OXYCODONE-ACETAMINOPHEN 5-325 MG PO TABS
ORAL_TABLET | ORAL | Status: AC
  Filled 2011-09-19: qty 1

## 2011-09-19 NOTE — Progress Notes (Addendum)
Occupational Therapy Session Notes  Patient Details  Name: [name] MRN: [id number] Date of Birth: 1951-03-18  Today's Date: 09/19/2011  Short Term Goals: Week 1:  OT Short Term Goal 1 (Week 1): Short Term Goals = Long Term Goals secondary to ELOS  Skilled Therapeutic Interventions/Progress Updates:   Session #1 [phone] - 40 Minutes Individual Therapy Patient with 10/10 complaints of pain in buttock & LLE amputation/surgical site; RN aware Patient supine in bed upon entering room. Patient with HOH this am, RN aware. RN present for dressing change -> LLE amputation and to look at right toe wound. Patient engaged in ADL retraining at bed level. Focused skilled intervention on bed mobility, dynamic sitting balance/tolerance/endurance, UB/LB bathing & dressing, overall activity tolerance/endurance, pain management, edge of bed -> recliner transfer with close supervision, and grooming tasks seated at sink in recliner. Left patient seated in recliner with call bell & phone within reach.   Session #2 [id number] - 45 Minutes Individual Therapy Patient with 10/10 pain; RN aware Patient found seated in recliner with wife present in room. Wife did not participate in therapy. Engaged in recliner -> w/c transfer with min assist. Patient then propelled self -> therapy gym and transferred from w/c -> therapy mat with min assist. Patient laid in supine position for UE strengthing -> serratus anterior using therapy weighted ball, weight bar, and 3lb dumbbells. Patient performed 3 exercises (10 reps & 3 sets of each exercise) with rest breaks prn. Patient also engaged in pressure relief during session. Left patient left side lying on mat for next therapy session, patient preferred to stay on mat until next session.   Precautions:  Precautions Precautions: Fall Precaution Comments: s/p L transmet amp (stage III sacral wound) Restrictions Weight Bearing Restrictions: Yes LLE Weight Bearing: Non  weight bearing  See FIM for current functional status  [name] 09/19/2011, 12:02 PM

## 2011-09-19 NOTE — Progress Notes (Signed)
Social Work  Social Work Assessment and Plan  Patient Details  Name: [name] MRN: [id number] Date of Birth: 1951/02/25  Today's Date: 09/19/2011  Problem List:  Patient Active Problem List  Diagnosis  . INSOMNIA, CHRONIC  . CVA  . Atherosclerosis of native arteries of the extremities with intermittent claudication  . History of tobacco abuse  . Peripheral vascular occlusive disease  . Dyspnea on exertion  . Chronic diastolic CHF (congestive heart failure)  . CKD (chronic kidney disease) stage V requiring chronic dialysis  . Hyperlipidemia  . Hypertension  . End stage renal disease  . Occlusion and stenosis of carotid artery without mention of cerebral infarction  . Peripheral vascular disease, unspecified  . Pleural effusion  . SBO (small bowel obstruction)  . Pneumonia  . C. difficile colitis- recalcitrant  . Atrial fibrillation  . Atrial flutter  . CAD (coronary artery disease)  . Acute respiratory failure with hypoxia  . Septic shock  . Nausea and vomiting  . Constipation  . Hospital-acquired pneumonia  . Metabolic encephalopathy  . Anemia, chronic renal failure - baseline hgb 9-10  . Ischemia of left foot  . Amputation of leg   Past Medical History:  Past Medical History  Diagnosis Date  . Hyperlipidemia   . ESRD (end stage renal disease)     a. MWF dialysis in [place] (followed by Dr. Kristian Covey)  . Arthritis   . Claudication   . Stroke 2005  . Anemia   . Chronic diastolic CHF (congestive heart failure) 07/2010    a. 07/2008 Echo EF 50-55%, mild-mod LVH, Gr 1 DD.  Marland Kitchen Peripheral vascular occlusive disease     a. 07/2011 Periph Angio: No signif Ao-illiac dzs, LCF stenosis, 100% LSFA w recon above knee pop and 3 vessel runoff, 100% RSFA w/ recon above knee --> pending L Fem to below Knee pop bypass.  Marland Kitchen History of tobacco abuse   . Hypertension     takes Amlodipine,Metoprolol,and Prinivil daily  . Dyspnea on exertion     with exertion   Past Surgical  History:  Past Surgical History  Procedure Date  . Arteriovenous graft placement   . Av fistula placement 12/10/2000    Right brachiocephalic arteriovenous   . Hernia repair   . Aortagram 07/29/2011    Abdominal Aortagram  . Cardiac catheterization 08/01/11    Left heart catheterization  . Femoral-popliteal bypass graft 09/09/2011    Procedure: BYPASS GRAFT FEMORAL-POPLITEAL ARTERY;  Surgeon: [name], MD;  Location: [facility] OR;  Service: Vascular;  Laterality: Left;  . Amputation 09/12/2011    Procedure: AMPUTATION BELOW KNEE;  Surgeon: [name], MD;  Location: [facility] OR;  Service: Vascular;  Laterality: Left;  TRANSMETATARSAL   Social History:  reports that he quit smoking about 5 months ago. His smoking use included Cigarettes. He has a 40 pack-year smoking history. He does not have any smokeless tobacco history on file. He reports that he does not drink alcohol or use illicit drugs.  Family / Support Systems Marital Status: Married How Long?: but wife lives at another apartment very close to pt's apt. -  Patient Roles: Parent;Spouse (brother) Spouse/Significant Other: wife, [name] @ [phone] Children: son, [name] [facility]) @ [phone] Other Supports: sister, [name] [facility]) @ (H) [phone] or [phone] Anticipated Caregiver: sister- [name] (if needed, per pt) Ability/Limitations of Caregiver: works 5am-12pm, but family will provide assistance Caregiver Availability: 24/7 Family Dynamics: Pt reports that the reason  he and wife live apart is because the apt she had moved into is not accessible for him. States their relationship is "good...we just don't live together".  Reports that other family also very supportive, but pt prefers to return to his own apt at d/c if he can.  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  Speak in very matter-of=fact manner about his medical issues and denies any concerns about being able to manage alone in his apt at d/c .  Denies any significant emotional distress - laughing easily with this SW and denying any s/s of depression or anxiety.  Depression screen deferred at this time but will monitor as needed. Recent Psychosocial Issues: None Pyschiatric History: None Substance Abuse History: None  Patient / Family Perceptions, Expectations & Goals Pt/Family understanding of illness & functional limitations: Pt and family with very general understanding of the medical issues (PVD) leading to the need for his amputation.  Also of the anticipated medical care needs at d/c for wound care. Premorbid pt/family roles/activities: Pt living independently PTA and maintaining good relationship with his family.  Utilizing community resources as needed (i.e. CNA/ PCS and transportation services) Anticipated changes in roles/activities/participation: Pt does not anticipate much change - feels he can regain his prior level of independence and can return to his own apt. Pt/family expectations/goals: To return to his apt with prior programs resumed.  Community Resources [employer]: Other (Comment) (PCS via [employer]; HD in [place]) Premorbid Home Care/DME Agencies: Other (Comment) (?[facility]) Transportation available at discharge: uses Medicaid transportation [place] for HD [employer] referrals recommended: Support group (specify) (Amputee Group)  Discharge Planning Living  Arrangements: Alone Support Systems: Spouse/significant other;Other relatives;Children;Home care staff Type of Residence: Private residence (Independent Living apartment) Insurance Resources: Medicare;Medicaid (specify county) [facility]) Financial Resources: Ambulance person Screen Referred: No Living Expenses: Psychologist, sport and exercise Management: Patient Do you have any problems obtaining your medications?: No Home Management: PCS aide assists Patient/Family Preliminary Plans: Pt hopes he can just return home to his own apt and 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 "I'd do better at my own place". Social Work Anticipated Follow Up Needs: HH/OP Expected length of stay: 10-12 days  Clinical Impression Pleasant, talkative gentleman here after amputation.  Motivated to return to his own independent living apt with PCS aide support.  Family is offering for him to d/c to sister's home with them sharing in 24/7 assist if needed.  Will discuss further with tx team once goals established and assist with d/c plans as his needs are indicated.  Megan Salon, Gilberta Peeters 09/19/2011, 4:48 PM

## 2011-09-19 NOTE — Progress Notes (Signed)
Hydrotherapy Note   09/19/11 1200  Subjective Assessment  Subjective "I'm tired out!"  Evaluation and Treatment  Evaluation and Treatment Procedures Explained to Patient/Family Yes  Evaluation and Treatment Procedures agreed to  Pressure Ulcer 08/27/11 Stage II -  Partial thickness loss of dermis presenting as a shallow open ulcer with a red, pink wound bed without slough.  Date First Assessed/Time First Assessed: 08/27/11 0800   Location: Sacrum  Staging: Stage II -  Partial thickness loss of dermis presenting as a shallow open ulcer with a red, pink wound bed without slough.  State of Healing Early/partial granulation  Site / Wound Assessment Pink;Yellow  Peri-wound Assessment Erythema (non-blanchable)  Margins Unattacted edges (unapproximated)  Drainage Amount Minimal  Drainage Description Purulent  Treatment Hydrotherapy (Pulse lavage)  Dressing Type ABD;Barrier Film (skin prep);Moist to dry;Gauze (Comment) (Santyl)  Dressing Clean;Dry;Intact  Hydrotherapy  Pulsed lavage therapy - wound location Sacrum  Pulsed Lavage with Suction (psi) 8 psi  Pulsed Lavage with Suction - Normal Saline Used 1000 mL  Pulsed Lavage Tip Tip with splash shield  Selective Debridement  Selective Debridement - Location sacrum  Selective Debridement - Tools Used Forceps;Scissors  Selective Debridement - Tissue Removed yellow slough  Wound Therapy - Assess/Plan/Recommendations  Wound Therapy - Clinical Statement Slow improvement continues.  Wound Therapy - Functional Problem List Decreased mobility  Factors Delaying/Impairing Wound Healing Immobility;Multiple medical problems;Polypharmacy  Hydrotherapy Plan Debridement;Dressing change;Patient/family education;Pulsatile lavage with suction  Wound Therapy - Frequency 6X / week  Wound Therapy - Follow Up Recommendations Home health RN  Wound Therapy Goals - Improve the function of patient's integumentary system by progressing the wound(s) through the  phases of wound healing by:  Decrease Necrotic Tissue - Progress Progressing toward goal  Increase Granulation Tissue - Progress Progressing toward goal    Mack Hook, PT [phone]

## 2011-09-19 NOTE — Progress Notes (Addendum)
Subjective/Complaints: Pain an issue. "10/10" at times Other ros items reviewed and otherwise negative  Objective: Vital Signs: Blood pressure 157/81, pulse 62, temperature 98.6 F (37 C), temperature source Oral, resp. rate 19, height 5\' 6"  (1.676 m), weight 60.5 kg (133 lb 6.1 oz), SpO2 100.00%. No results found.  Basename 09/17/11 1600  WBC 7.8  HGB 8.5*  HCT 26.8*  PLT 217    Basename 09/17/11 1600  NA 133*  K 4.2  CL 96  CO2 25  GLUCOSE 86  BUN 22  CREATININE 4.97*  CALCIUM 8.7   CBG (last 3)  No results found for this basename: GLUCAP:3 in the last 72 hours  Wt Readings from Last 3 Encounters:  09/19/11 60.5 kg (133 lb 6.1 oz)  09/15/11 61.6 kg (135 lb 12.9 oz)  09/15/11 61.6 kg (135 lb 12.9 oz)    Physical Exam:  General appearance: alert, cooperative and no distress Head: Normocephalic, without obvious abnormality, atraumatic Eyes: conjunctivae/corneas clear. PERRL, EOM's intact. Fundi benign. Ears: normal TM's and external ear canals both ears Nose: Nares normal. Septum midline. Mucosa normal. No drainage or sinus tenderness. Throat: lips, mucosa, and tongue normal; teeth and gums normal Neck: no adenopathy, no carotid bruit, no JVD, supple, symmetrical, trachea midline and thyroid not enlarged, symmetric, no tenderness/mass/nodules Back: symmetric, no curvature. ROM normal. No CVA tenderness. Resp: clear to auscultation bilaterally Cardio: regular rate and rhythm, S1, S2 normal, no murmur, click, rub or gallop GI: soft, non-tender; bowel sounds normal; no masses,  no organomegaly Extremities: extremities normal, atraumatic, no cyanosis or edema Pulses: 2+ and symmetric Skin: Skin color, texture, turgor normal. No rashes or lesions Neurologic: Grossly normal- short term memory deficits. Non-focal weakness except for pain inhibition in the left lef. Incision/Wound: left foot incision with serosanginous drainage. Wound fairly well approximated. The  remainder of the foot is intact. Right 1st toe with a 1 inch area c/w with early breakdown/bloood blister. Stage II sacral wound persistent.   Assessment/Plan: 1. Functional deficits secondary to left TMA/BPG with deconditioning  which require 3+ hours per day of interdisciplinary therapy in a comprehensive inpatient rehab setting. Physiatrist is providing close team supervision and 24 hour management of active medical problems listed below. Physiatrist and rehab team continue to assess barriers to discharge/monitor patient progress toward functional and medical goals. FIM: FIM - Bathing Bathing Steps Patient Completed: Chest;Right Arm;Left Arm;Abdomen;Front perineal area;Right upper leg;Left upper leg Bathing: 3: Mod-Patient completes 5-7 12f 10 parts or 50-74%  FIM - Upper Body Dressing/Undressing Upper body dressing/undressing steps patient completed: Thread/unthread right sleeve of pullover shirt/dresss;Thread/unthread left sleeve of pullover shirt/dress;Put head through opening of pull over shirt/dress;Pull shirt over trunk Upper body dressing/undressing: 5: Supervision: Safety issues/verbal cues FIM - Lower Body Dressing/Undressing Lower body dressing/undressing steps patient completed: Thread/unthread left pants leg;Thread/unthread right pants leg;Fasten/unfasten pants Lower body dressing/undressing: 3: Mod-Patient completed 50-74% of tasks  FIM - Toileting Toileting: 0: Activity did not occur  FIM - Archivist Transfers: 3-To toilet/BSC: Mod A (lift or lower assist);3-From toilet/BSC: Mod A (lift or lower assist)  FIM - Bed/Chair Transfer Bed/Chair Transfer Assistive Devices: Arm rests Bed/Chair Transfer: 5: Supine > Sit: Supervision (verbal cues/safety issues);4: Bed > Chair or W/C: Min A (steadying Pt. > 75%)  FIM - Locomotion: Wheelchair Distance: 120' Locomotion: Wheelchair: 4: Travels 150 ft or more: maneuvers on rugs and over door sillls with minimal  assistance (Pt.>75%) FIM - Locomotion: Ambulation Locomotion: Ambulation Assistive Devices: Designer, industrial/product Ambulation/Gait Assistance:  (  Max A for sit to stand in RW, unable to assume upright postu) Locomotion: Ambulation: 0: Activity did not occur (pt req max A for sit>stand in RW, unsafe to amb)  Comprehension Comprehension Mode: Auditory Comprehension: 5-Follows basic conversation/direction: With no assist  Expression Expression Mode: Verbal Expression: 5-Expresses basic needs/ideas: With no assist  Social Interaction Social Interaction: 4-Interacts appropriately 75 - 89% of the time - Needs redirection for appropriate language or to initiate interaction.  Problem Solving Problem Solving: 4-Solves basic 75 - 89% of the time/requires cueing 10 - 24% of the time  Memory Memory: 6-More than reasonable amt of time   LOS (Days) 3 A FACE TO FACE EVALUATION WAS PERFORMED 1. Left transmetatarsal amputation/femoropopliteal bypass secondary to peripheral vascular disease  2. DVT Prophylaxis/Anticoagulation: SCDs to right lower extremity  3. Pain Management: Neurontin, Percocet. Schedule doses before activity. (d/ced hydrocodone) Hesitate starting long-acting agent given cognitive status (STM deficits) 4. Neuropsych: This patient is capable of making decisions on his/her own behalf.  5. End stage renal disease. Continue dialysis as per [facility]. HD at the end of therapy day.  6. Hypertension. Norvasc 10 mg daily, Imdur 30 mg daily, Lopressor 100 mg twice a day. Monitor with increased mobility  7. Hyperlipidemia. Zocor  8. History congestive heart failure. Monitor for any signs of fluid overload  9. Chronic anemia. Aranesp. Followup CBC with dialysis 10. Wound- petroleum dressing, 4x4, kerlix, ACE daily.   -Hydrotherapy to sacral wound.  Antar Milks T 09/19/2011, 7:20 AM

## 2011-09-19 NOTE — Progress Notes (Signed)
Patient complained of indigestion, assisted patient to readjust in bed. Called Pam Love, PA given order for alternagel 10 ml. Will continue to monitor patient.

## 2011-09-19 NOTE — Progress Notes (Signed)
MEDICATION RELATED CONSULT NOTE - FOLLOW UP   Pharmacy Consult for Weaning pain medication  No Known Allergies  Patient Measurements: Height: 5\' 6"  (167.6 cm) Weight: 133 lb 6.1 oz (60.5 kg) (patient weighed on bed scales) IBW/kg (Calculated) : 63.8   Vital Signs: Temp: 98.6 F (37 C) (06/28 0501) Temp src: Oral (06/28 0501) BP: 157/81 mmHg (06/28 0501) Pulse Rate: 62  (06/28 0501) Intake/Output from previous day: 06/27 0701 - 06/28 0700 In: 540 [P.O.:540] Out: -  Intake/Output from this shift: Total I/O In: 240 [P.O.:240] Out: -   Labs:  Basename 09/17/11 1600  WBC 7.8  HGB 8.5*  HCT 26.8*  PLT 217  APTT --  CREATININE 4.97*  LABCREA --  CREATININE 4.97*  CREAT24HRUR --  MG --  PHOS 4.7*  ALBUMIN 1.5*  PROT --  ALBUMIN 1.5*  AST --  ALT --  ALKPHOS --  BILITOT --  BILIDIR --  IBILI --   Estimated Creatinine Clearance: 13.2 ml/min (by C-G formula based on Cr of 4.97).   Microbiology: Recent Results (from the past 720 hour(s))  CULTURE, BLOOD (ROUTINE X 2)     Status: Normal   Collection Time   08/26/11 11:49 PM      Component Value Range Status Comment   Specimen Description BLOOD LEFT ARM   Final    Special Requests BOTTLES DRAWN AEROBIC ONLY [facility]   Final    Culture  Setup Time [id number]   Final    Culture NO GROWTH 5 DAYS   Final    Report Status 09/02/2011 FINAL   Final   CULTURE, BLOOD (ROUTINE X 2)     Status: Normal   Collection Time   08/26/11 11:56 PM      Component Value Range Status Comment   Specimen Description BLOOD LEFT HAND   Final    Special Requests BOTTLES DRAWN AEROBIC ONLY [facility]   Final    Culture  Setup Time [id number]   Final    Culture NO GROWTH 5 DAYS   Final    Report Status 09/02/2011 FINAL   Final   MRSA PCR SCREENING     Status: Normal   Collection Time   08/27/11  2:55 AM      Component Value Range Status Comment   MRSA by PCR NEGATIVE  NEGATIVE Final   CLOSTRIDIUM DIFFICILE BY PCR     Status: Abnormal   Collection Time   08/27/11  8:54 PM      Component Value Range Status Comment   C difficile by pcr POSITIVE (*) NEGATIVE Final     Medications:  Scheduled:    . amLODipine  10 mg Oral Daily  . antiseptic oral rinse  15 mL Mouth Rinse q12n4p  . carbamide peroxide  5 drop Both Ears BID  . chlorhexidine  15 mL Mouth Rinse BID  . cinacalcet  60 mg Oral Q breakfast  . collagenase   Topical Daily  . darbepoetin (ARANESP) injection - DIALYSIS  150 mcg Intravenous Q Wed-HD  . docusate sodium  100 mg Oral Daily  . doxercalciferol  6 mcg Intravenous Q M,W,F-HD  . feeding supplement (NEPRO CARB STEADY)  237 mL Oral BID BM  . gabapentin  100 mg Oral Custom  . gabapentin  200 mg Oral Q M,W,F-2000  . isosorbide mononitrate  30 mg Oral Daily  . metoprolol tartrate  100 mg Oral BID  . multivitamin  1 tablet Oral QHS  . oxyCODONE-acetaminophen  1 tablet Oral  Daily  . saccharomyces boulardii  250 mg Oral BID  . simvastatin  5 mg Oral QHS  . sodium chloride  10-40 mL Intracatheter Q12H  . DISCONTD: darbepoetin (ARANESP) injection - DIALYSIS  100 mcg Intravenous Q Wed-HD    Assessment: 61yo male with ESRD and h/o drug abuse, started on pain medication for pain-mgt s/p L-transmetatarsal amputation on 09/12/11.  His pain medications have been weaned significantly over the last several days, though Percocet was added back this AM- 1 tab every AM and 1 tab q4prn.  Pt consistently reports 10/10 pain, but is able to participate in therapies though did have some complaints of back pain early this AM.  Currently pain appears controlled and described as better to Dr. Caryn Section this AM.    Goal of Therapy:  Adequate pain control   Plan:  1) Continue Gabapentin at current dose, may be titrated up. 2)  Consider Ultram in place of Percocet due to h/o drug dependence, which does require adjustment for renal disease.  Recommend Ultram 50mg  bid prn. 3)  Pharmacy will sign off for now.  Please re-consult as  needed.  Marisue Humble, PharmD Clinical Pharmacist [facility]

## 2011-09-19 NOTE — Progress Notes (Addendum)
 [facility]  Subjective:  In bed.  Says  pain in foot better.  Complains that he can't hear out of either ear--wonders whether they're "plugged up."   Objective: Vital signs in last 24 hours: Blood pressure 157/81, pulse 62, temperature 98.6 F (37 C), temperature source Oral, resp. rate 19, height 5\' 6"  (1.676 m), weight 60.5 kg (133 lb 6.1 oz), SpO2 100.00%. PHYSICAL EXAM General--as above Chest--cath in L IJ, clear Heart--norub Abd--nontender Extr--post op, clotted AVF R upper arm, non fct AVF L forearm, old           AVG L upper arm  Lab Results:   Lab 09/17/11 1600 09/15/11 0657  NA 133* 138  K 4.2 3.9  CL 96 100  CO2 25 27  BUN 22 21  CREATININE 4.97* 5.69*  ALB -- --  GLUCOSE 86 --  CALCIUM 8.7 8.1*  PHOS 4.7* 4.5    Assessment/Plan:   1. Sepsis Syndrome with C diff colitis/PNA: Improved - now off PO vanco for C diff.  2. PVD- S/p L fem-pop and mid foot amputation last week. Rehab and HD today. He lives alone. Per [facility]  3. ESRD: continue MWF HD via tunneled cath (apparently no other options?). AVF clotted in R upper arm. Old non-fct accesses in L arm.  Regular dialysis unit is [facility]. 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 Phos is 4.7.  5.. HTN/volume- controlled with HD and meds. BP OK on amlodipine 10/d (dose was not decreased to 5/d yesterday) + metoprolol 100 BID 7. Anemia- hgb 8.5 yesterday. Fe/TIBC 30 % sat, ferritin-2323 on 24 Jun. On aranesp 100/wk--no IV Fe . Increase aranesp to 150/wk  8. Dispo-continue rehab. Back to [place] (home) at time of d/c [facility] dialysis)  Spoke to PA who will look in ears with otoscope.   LOS: 3 days   Amaryah Mallen F 09/19/2011,10:30 AM   .labalb

## 2011-09-19 NOTE — Progress Notes (Signed)
Reviewed and in agreement with treatment provided.  

## 2011-09-19 NOTE — Progress Notes (Signed)
Physical Therapy Session Note  Patient Details  Name: QUAMAINE WEBB MRN: [id number] Date of Birth: 14-Nov-1950  Today's Date: 09/19/2011 Time:  - 0830-0900    Short Term Goals: Week 1:  PT Short Term Goal 1 (Week 1): Pt will maintain NWB status of LLE 75% of the time without cueing. PT Short Term Goal 2 (Week 1): Pt will perform supine<>sit transfers with Min A. PT Short Term Goal 3 (Week 1): Pt will perform w/c<>bed transfers with Min A.  Skilled Therapeutic Interventions/Progress Updates:    Pt awoken for PT, states he had been up much of the night using the bathroom after a laxative.    Pt asked to sit up and stated PT had to help him, when encouraged to be more I he states "before there was nothing wrong with me, now I had surgery and a sore on my back". PT assisted pt to get R elbow under him from R sidelying then he was able to push up to sitting with increased time. I sitting balance while seated EOB eating breakfast.  Unable to open ensure can, A to open containers.  Stood x 3 from raised bed, min A x 1 close S x 2, holding w/c for support with poor standing endurance- less than 25 sec each trial limited by RLE fatigue and back discomfort.  CLose S static standing balance with BUE support and able to let go with 1 hand to drink.  Therapy Documentation Precautions:  Precautions Precautions: Fall Precaution Comments: s/p L transmet amp (stage III sacral wound) Restrictions Weight Bearing Restrictions: Yes LLE Weight Bearing: Non weight bearing Pain: no c/o pain specifically during seated activity but did c/o back pain once standing- performed stertching with some relief, See FIM for current functional status  Therapy/Group: Individual Therapy  Michaelene Song 09/19/2011, 9:01 AM

## 2011-09-19 NOTE — Progress Notes (Signed)
Physical Therapy Session Note  Patient Details  Name: [name] MRN: [id number] Date of Birth: [birth year]  Today's Date: 09/19/2011 Time: 4098-1191 Time Calculation (min): 30 min  Short Term Goals: Week 1:  PT Short Term Goal 1 (Week 1): Pt will maintain NWB status of LLE 75% of the time without cueing. PT Short Term Goal 2 (Week 1): Pt will perform supine<>sit transfers with Min A. PT Short Term Goal 3 (Week 1): Pt will perform w/c<>bed transfers with Min A.  Skilled Therapeutic Interventions/Progress Updates:    Pt received sleeping  in L side lying on mat in gym. Pt presented with significant difficulty hearing therapist stating that he has had trouble hearing all day but this is nothing new to him.  Noted significant swelling throughout entire RUE today which was not noticed at eval.  Pt reports that both UEs hurt as well.  Notified RN of edema who reported that she had recognized it and will draw dialysis physician's attention to it this pm.  Tx included mat<>w/c transfer with pt able to transfer to even level surface with Min A.  Pt had difficulty self-propelling w/c d/t UE pain in RUE.  Transferred w/c>bed with Mod A.  Exercised AAROM bilateral hip Abd and ext in side lying 1 x 10- pt presenting with little motor control and quad control with each exercise.  Pt left in side lying in bed in preparation for dialysis.  Therapy Documentation Precautions:  Precautions Precautions: Fall Precaution Comments: s/p L transmet amp (stage III sacral wound) Restrictions Weight Bearing Restrictions: Yes LLE Weight Bearing: Non weight bearing  Pain: Pt c/o "soreness" in bilateral UE and LEs- premedicated.  See FIM for current functional status  Therapy/Group: Individual Therapy  Deirdre Pippins 09/19/2011, 3:20 PM

## 2011-09-20 DIAGNOSIS — E1165 Type 2 diabetes mellitus with hyperglycemia: Secondary | ICD-10-CM

## 2011-09-20 DIAGNOSIS — L98499 Non-pressure chronic ulcer of skin of other sites with unspecified severity: Secondary | ICD-10-CM

## 2011-09-20 DIAGNOSIS — I739 Peripheral vascular disease, unspecified: Secondary | ICD-10-CM

## 2011-09-20 DIAGNOSIS — N186 End stage renal disease: Secondary | ICD-10-CM

## 2011-09-20 DIAGNOSIS — S98919A Complete traumatic amputation of unspecified foot, level unspecified, initial encounter: Secondary | ICD-10-CM

## 2011-09-20 DIAGNOSIS — Z5189 Encounter for other specified aftercare: Secondary | ICD-10-CM

## 2011-09-20 MED ORDER — OXYCODONE-ACETAMINOPHEN 5-325 MG PO TABS: 1.0000 | ORAL_TABLET | Freq: Three times a day (TID) | ORAL | Status: DC | PRN

## 2011-09-20 MED ORDER — HEPARIN SODIUM (PORCINE) 1000 UNIT/ML DIALYSIS
20.0000 [IU]/kg | INTRAMUSCULAR | Status: DC | PRN
  Administered 2011-09-22: 1200 [IU] via INTRAVENOUS_CENTRAL
  Filled 2011-09-20: qty 2

## 2011-09-20 MED ORDER — OXYCODONE-ACETAMINOPHEN 5-325 MG PO TABS
2.0000 | ORAL_TABLET | Freq: Four times a day (QID) | ORAL | Status: DC | PRN
  Administered 2011-09-20 (×2): 2 via ORAL
  Administered 2011-09-20: 1 via ORAL
  Administered 2011-09-21 – 2011-09-26 (×19): 2 via ORAL
  Filled 2011-09-20 (×19): qty 2

## 2011-09-20 MED ORDER — GABAPENTIN 100 MG PO CAPS
200.0000 mg | ORAL_CAPSULE | Freq: Every day | ORAL | Status: DC
  Administered 2011-09-20 – 2011-09-25 (×6): 200 mg via ORAL
  Filled 2011-09-20 (×8): qty 2

## 2011-09-20 NOTE — Progress Notes (Signed)
Hydrotherapy Note   09/20/11 1200  Pressure Ulcer 08/27/11 Stage II -  Partial thickness loss of dermis presenting as a shallow open ulcer with a red, pink wound bed without slough.  Date First Assessed/Time First Assessed: 08/27/11 0800   Location: Sacrum  Staging: Stage II -  Partial thickness loss of dermis presenting as a shallow open ulcer with a red, pink wound bed without slough.  State of Healing Early/partial granulation  Site / Wound Assessment Pink;Yellow  % Wound base Red or Granulating 10%  % Wound base Yellow 90%  Peri-wound Assessment Intact  Margins Unattacted edges (unapproximated)  Drainage Amount Minimal  Drainage Description Purulent  Treatment Hydrotherapy (Pulse lavage)  Dressing Type ABD;Barrier Film (skin prep);Moist to dry (Santyl)  Dressing Changed  Hydrotherapy  Pulsed lavage therapy - wound location Sacrum  Pulsed Lavage with Suction (psi) 8 psi  Pulsed Lavage with Suction - Normal Saline Used 1000 mL  Pulsed Lavage Tip Tip with splash shield  Selective Debridement  Selective Debridement - Location sacrum  Selective Debridement - Tools Used Forceps;Scissors  Selective Debridement - Tissue Removed yellow slough  Wound Therapy - Assess/Plan/Recommendations  Wound Therapy - Clinical Statement Slow improvement continues.  Wound Therapy - Functional Problem List Decreased mobility  Factors Delaying/Impairing Wound Healing Immobility;Multiple medical problems;Polypharmacy  Hydrotherapy Plan Debridement;Dressing change;Patient/family education;Pulsatile lavage with suction  Wound Therapy - Frequency 6X / week  Wound Therapy - Follow Up Recommendations Home health RN  Wound Therapy Goals - Improve the function of patient's integumentary system by progressing the wound(s) through the phases of wound healing by:  Decrease Necrotic Tissue to 25  Decrease Necrotic Tissue - Progress Not progressing  Increase Granulation Tissue to 75  Increase Granulation Tissue -  Progress Mot progressing

## 2011-09-20 NOTE — Progress Notes (Signed)
[facility]  Subjective:  Awake,alert, complains of pain L stump and decreased hearing Bilat. Dialyzed yesterday   Objective: Vital signs in last 24 hours: Blood pressure 171/68, pulse 64, temperature 98.5 F (36.9 C), temperature source Oral, resp. rate 16, height 5\' 6"  (1.676 m), weight 62.2 kg (137 lb 2 oz), SpO2 95.00%.  PHYSICAL EXAM General--as above Ears--EACs occluded with cerumen Chest--cath L IJ, clear Heart--no rub Abd--nontender Extr--post op L LE, clotted AVF R upper arm, non fct AVF L forearm, old AVG L upper arm   Lab Results:   Lab 09/17/11 1600 09/15/11 0657  NA 133* 138  K 4.2 3.9  CL 96 100  CO2 25 27  BUN 22 21  CREATININE 4.97* 5.69*  ALB -- --  GLUCOSE 86 --  CALCIUM 8.7 8.1*  PHOS 4.7* 4.5     Basename 09/17/11 1600  WBC 7.8  HGB 8.5*  HCT 26.8*  PLT 217    Assessment/Plan: 1. Sepsis Syndrome with C diff colitis/PNA: Improved - now off PO vanco for C diff.  2. PVD- S/p L fem-pop and mid foot amputation last week. Rehab and HD today. He lives alone. Per [facility]  3. ESRD: continue MWF HD via tunneled cath (apparently no other options?). AVF clotted in R upper arm. Old non-fct accesses in L arm. Regular dialysis unit is [facility]. 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 Phos is 4.7.  5.. HTN/volume- controlled with HD and meds. BP OK on amlodipine 10/d (dose was not decreased to 5/d yesterday) + metoprolol 100 BID.  Wt to 60 kg in HD on 1 Jul  7. Anemia- hgb 8.5 yesterday. Fe/TIBC 30 % sat, ferritin-2323 on 24 Jun. On aranesp 100/wk--no IV Fe . Increase aranesp to 150/wk  8. Dispo-continue rehab. Back to [place] (home) at time of d/c [facility] dialysis)    LOS: 4 days   Sherica Paternostro F 09/20/2011,10:50 AM   .labalb

## 2011-09-20 NOTE — Progress Notes (Signed)
Physical Therapy Session Note  Patient Details  Name: [name] MRN: [id number] Date of Birth: 22-Jan-1951  Today's Date: 09/20/2011 Time: 4098-1191 Time Calculation (min): 60 min  Short Term Goals: Week 1:  PT Short Term Goal 1 (Week 1): Pt will maintain NWB status of LLE 75% of the time without cueing. PT Short Term Goal 2 (Week 1): Pt will perform supine<>sit transfers with Min A. PT Short Term Goal 3 (Week 1): Pt will perform w/c<>bed transfers with Min A.  Therapy Documentation Precautions:  Precautions Precautions: Fall Precaution Comments: s/p L transmet amp (stage III sacral wound) Restrictions Weight Bearing Restrictions: Yes LLE Weight Bearing: Non weight bearing Pain: patient initially reporting that pain in Left foot and back 10/10 and that if he moves around the pain goes to a 15           PT was unable to orient patient to a 10 scale for pain.  Patient was willing to participate with PT tx session and tolerated throughout.  Therapeutic Activity: (15') Bed mobility rolling Left and Right with Mod-I using rails                                           Transfer bed<->w/c via squat-pivot transfer with Mod-A Therapeutic Exercise:(15') B LE's in supine and sitting Wheel Chair Management:(15') Propelling w/c 2 x 120' and able to manage foot rests with min-A Gait Training:(15') parallel bars x 3' forward and backward x 2 with min-A                              Pre-gait strengthening with multiple transfers sit<->stand in parallel bars with Mod-A and patient able to maintain NWB L LE with tactile and verbal cues.  See FIM for current functional status  Therapy/Group: Individual Therapy  Crystallynn Noorani J 09/20/2011, 1:55 PM

## 2011-09-20 NOTE — Progress Notes (Signed)
Physical Therapy Session Note  Patient Details  Name: [name] MRN: [id number] Date of Birth: 1951/03/08  Today's Date: 09/20/2011 Time: 4098-1191 Time Calculation (min): 45 min  Short Term Goals: Week 1:  PT Short Term Goal 1 (Week 1): Pt will maintain NWB status of LLE 75% of the time without cueing. PT Short Term Goal 2 (Week 1): Pt will perform supine<>sit transfers with Min A. PT Short Term Goal 3 (Week 1): Pt will perform w/c<>bed transfers with Min A.   Therapy Documentation Precautions:  Precautions Precautions: Fall Precaution Comments: s/p L transmet amp (stage III sacral wound) Restrictions Weight Bearing Restrictions: Yes LLE Weight Bearing: Non weight bearing Pain: Reporting severe pain at L LE 8-10 with burning at L foot and itching           Back pain is ranging from 8-10 (Nursing notified  Therapeutic Exercise: (30') B LE's in supine and in sitting Therapeutic Activity: (15') Transfer training bed<->w/c<->mat table initially with Mod-A and later with min-A and verbal cues for hand placement and sequencing  Patient tolerated tx session well and was returned to bed for wound care management.  See FIM for current functional status  Therapy/Group: Individual Therapy  Rex Kras 09/20/2011, 8:48 AM

## 2011-09-20 NOTE — Progress Notes (Addendum)
Patient ID: [name], male   DOB: 07-02-1950, 61 y.o.   MRN: [id number]  Subjective/Complaints: Pain an issue. "10/10" at times, slept poorly Other ros items reviewed and otherwise negative  Objective: Vital Signs: Blood pressure 171/68, pulse 64, temperature 98.5 F (36.9 C), temperature source Oral, resp. rate 16, height 5\' 6"  (1.676 m), weight 62.2 kg (137 lb 2 oz), SpO2 95.00%. No results found.  Basename 09/17/11 1600  WBC 7.8  HGB 8.5*  HCT 26.8*  PLT 217    Basename 09/17/11 1600  NA 133*  K 4.2  CL 96  CO2 25  GLUCOSE 86  BUN 22  CREATININE 4.97*  CALCIUM 8.7   CBG (last 3)  No results found for this basename: GLUCAP:3 in the last 72 hours  Wt Readings from Last 3 Encounters:  09/19/11 62.2 kg (137 lb 2 oz)  09/15/11 61.6 kg (135 lb 12.9 oz)  09/15/11 61.6 kg (135 lb 12.9 oz)    Physical Exam:  General appearance: alert, cooperative and no distress Head: Normocephalic, without obvious abnormality, atraumatic Eyes: sclera, conjunctiva clear Ears: normal external Nose: Nares normal. Septum midline. Mucosa normal. No drainage or sinus tenderness. Throat: lips, mucosa, and tongue normal; teeth and gums normal Neck: no adenopathy, no carotid bruit, no JVD, supple, symmetrical, trachea midline and thyroid not enlarged, symmetric, no tenderness/mass/nodules Back: symmetric, no curvature. ROM normal. No CVA tenderness. Resp: clear to auscultation bilaterally Cardio: regular rate and rhythm, S1, S2 normal, no murmur, click, rub or gallop GI: soft, non-tender; bowel sounds normal; no masses,  no organomegaly Extremities: extremities normal, atraumatic, no cyanosis or edema Pulses: 2+ and symmetric Skin: see below Neurologic: Grossly normal- short term memory deficits. Non-focal weakness except for pain inhibition in the left  Incision/Wound: left foot incision with serosanginous drainage. Wound fairly well approximated. The remainder of the foot is intact.  Right 1st toe with a 1 inch area c/w with early breakdown/bloood blister. Stage II sacral wound persistent.   Assessment/Plan: 1. Functional deficits secondary to left TMA/BPG with deconditioning  which require 3+ hours per day of interdisciplinary therapy in a comprehensive inpatient rehab setting. Physiatrist is providing close team supervision and 24 hour management of active medical problems listed below. Physiatrist and rehab team continue to assess barriers to discharge/monitor patient progress toward functional and medical goals. FIM: FIM - Bathing Bathing Steps Patient Completed: Chest;Right Arm;Left Arm;Abdomen;Front perineal area;Buttocks;Right upper leg;Left upper leg;Right lower leg (including foot) Bathing: 4: Steadying assist  FIM - Upper Body Dressing/Undressing Upper body dressing/undressing steps patient completed: Thread/unthread right sleeve of pullover shirt/dresss;Thread/unthread left sleeve of pullover shirt/dress;Put head through opening of pull over shirt/dress;Pull shirt over trunk Upper body dressing/undressing: 5: Set-up assist to: Obtain clothing/put away FIM - Lower Body Dressing/Undressing Lower body dressing/undressing steps patient completed: Thread/unthread right pants leg;Thread/unthread left pants leg;Pull pants up/down;Fasten/unfasten pants;Don/Doff right sock Lower body dressing/undressing: 4: Steadying Assist  FIM - Toileting Toileting: 0: Activity did not occur  FIM - Archivist Transfers: 3-To toilet/BSC: Mod A (lift or lower assist);3-From toilet/BSC: Mod A (lift or lower assist)  FIM - Press photographer Assistive Devices: Arm rests Bed/Chair Transfer: 5: Supine > Sit: Supervision (verbal cues/safety issues);5: Sit > Supine: Supervision (verbal cues/safety issues);3: Chair or W/C > Bed: Mod A (lift or lower assist)  FIM - Locomotion: Wheelchair Distance: 120' Locomotion: Wheelchair: 1: Travels less than 50 ft  with minimal assistance (Pt.>75%) FIM - Locomotion: Ambulation Locomotion: Ambulation Assistive Devices: Designer, industrial/product  Ambulation/Gait Assistance:  (Max A for sit to stand in RW, unable to assume upright postu) Locomotion: Ambulation: 0: Activity did not occur  Comprehension Comprehension Mode: Auditory Comprehension: 5-Follows basic conversation/direction: With no assist  Expression Expression Mode: Verbal Expression: 5-Expresses basic needs/ideas: With no assist  Social Interaction Social Interaction: 5-Interacts appropriately 90% of the time - Needs monitoring or encouragement for participation or interaction.  Problem Solving Problem Solving: 4-Solves basic 75 - 89% of the time/requires cueing 10 - 24% of the time  Memory Memory: 5-Recognizes or recalls 90% of the time/requires cueing < 10% of the time   LOS (Days) 4 A FACE TO FACE EVALUATION WAS PERFORMED 1. Left transmetatarsal amputation/femoropopliteal bypass secondary to peripheral vascular disease  2. DVT Prophylaxis/Anticoagulation: SCDs to right lower extremity  3. Pain Management: Neurontin increase to 200mg , Percocet increase to 10mg . Schedule doses before activity. (d/ced hydrocodone) Hesitate starting long-acting agent given cognitive status (STM deficits) 4. Neuropsych: This patient is capable of making decisions on his/her own behalf.  5. End stage renal disease. Continue dialysis as per [facility]. HD at the end of therapy day.  6. Hypertension. Norvasc 10 mg daily, Imdur 30 mg daily, Lopressor 100 mg twice a day. Monitor with increased mobility  7. Hyperlipidemia. Zocor  8. History congestive heart failure. Monitor for any signs of fluid overload  9. Chronic anemia. Aranesp. Followup CBC with dialysis 10. Wound- petroleum dressing, 4x4, kerlix, ACE daily.   -Hydrotherapy to sacral wound.  Erick Colace 09/20/2011, 7:36 AM

## 2011-09-20 NOTE — Progress Notes (Signed)
Occupational Therapy Session Note  Patient Details  Name: [name] MRN: [id number] Date of Birth: [birth year]  Today's Date: 09/20/2011 Time:  1st session:   1015-1100 Time Calculation (min): 45 min              2nd session:  Pt refused due to pain Short Term Goals: Week 1:  OT Short Term Goal 1 (Week 1): Short Term Goals = Long Term Goals secondary to ELOS  Skilled Therapeutic Interventions/Progress Updates:     1st session:  Addressed bathing and dressing EOB.  Performed sit to  Stand with mod a  For donning pants.  Did lateral leans for pericare.  Pt  Needed minimal assist with supine to sit.  Transferred bed to wc with mod assist.      2nd session:  Pt. Refused OT due to pain 10/10 in hip.        Therapy Documentation Precautions:  Precautions Precautions: Fall Precaution Comments: s/p L transmet amp (stage III sacral wound) Restrictions Weight Bearing Restrictions: Yes LLE Weight Bearing: Non weight bearing    Pain:  10/10   See FIM for current functional status  Therapy/Group: Individual Therapy  Humberto Seals 09/20/2011, 11:02 AM

## 2011-09-21 NOTE — Progress Notes (Signed)
Physical Therapy Note  Patient Details  Name: GEN CLAGG MRN: [id number] Date of Birth: [birth year] Today's Date: 09/21/2011  1300-1355 (55 minutes) group Pain: 8/10 LT foot/premedicated Pt participated in PT group session focused on bilateral LE strengthening/ transfer training. Pt performed the following in sidelying secondary to sacral wound- (X 20) bilateral hip flexion/extension, knee flexion /extension with tactile cues for correct technique. Transfers wc><mat scoot with assist of therapist to prevent weight bearing LT LE plus vcs for safe technique. Pt practiced scooting side to side on mat with assist to prevent LT LE weight bearing + vcs to increase forward trunk flexion during scooting. WC pushups 2 X 5 mod assist for UE strengthening and to improve transfers.    Cohick,JIM 09/21/2011, 1:59 PM

## 2011-09-21 NOTE — Progress Notes (Signed)
Patient ID: [name], male   DOB: Nov 22, 1950, 61 y.o.   MRN: [id number]  Subjective/Complaints: Pain an issue but review of pain scores from 6/29  mostly 2-4/10 range ,  "10/10" at times able to tolerate PT/OT, slept poorly.  Decreased hearing, has cerumenosis No signs of lethargy or confusion Other ros items reviewed and otherwise negative  Objective: Vital Signs: Blood pressure 153/72, pulse 60, temperature 98.1 F (36.7 C), temperature source Oral, resp. rate 16, height 5\' 6"  (1.676 m), weight 62.2 kg (137 lb 2 oz), SpO2 100.00%. No results found. No results found for this basename: WBC:2,HGB:2,HCT:2,PLT:2 in the last 72 hours No results found for this basename: NA:2,K:2,CL:2,CO2:2,GLUCOSE:2,BUN:2,CREATININE:2,CALCIUM:2 in the last 72 hours CBG (last 3)  No results found for this basename: GLUCAP:3 in the last 72 hours  Wt Readings from Last 3 Encounters:  09/19/11 62.2 kg (137 lb 2 oz)  09/15/11 61.6 kg (135 lb 12.9 oz)  09/15/11 61.6 kg (135 lb 12.9 oz)    Physical Exam:  General appearance: alert, cooperative and no distress Head: Normocephalic, without obvious abnormality, atraumatic Eyes: sclera, conjunctiva clear Ears: normal external Nose: Nares normal. Septum midline. Mucosa normal. No drainage or sinus tenderness. Throat: lips, mucosa, and tongue normal; teeth and gums normal Neck: no adenopathy, no carotid bruit, no JVD, supple, symmetrical, trachea midline and thyroid not enlarged, symmetric, no tenderness/mass/nodules Back: symmetric, no curvature. ROM normal. No CVA tenderness. Resp: clear to auscultation bilaterally Cardio: regular rate and rhythm, S1, S2 normal, no murmur, click, rub or gallop GI: soft, non-tender; bowel sounds normal; no masses,  no organomegaly Extremities: extremities normal, atraumatic, no cyanosis or edema Pulses: 2+ and symmetric Skin: see below Neurologic: Grossly normal- short term memory deficits. Non-focal weakness except for  pain inhibition in the left  Incision/Wound: left foot incision with serosanginous drainage. Wound fairly well approximated. The remainder of the foot is intact. Right 1st toe with a 1 inch area c/w with early breakdown/bloood blister. Stage II sacral wound persistent.   Assessment/Plan: 1. Functional deficits secondary to left TMA/BPG with deconditioning  which require 3+ hours per day of interdisciplinary therapy in a comprehensive inpatient rehab setting. Physiatrist is providing close team supervision and 24 hour management of active medical problems listed below. Physiatrist and rehab team continue to assess barriers to discharge/monitor patient progress toward functional and medical goals. FIM: FIM - Bathing Bathing Steps Patient Completed: Chest;Right Arm;Left Arm;Abdomen;Front perineal area;Right upper leg;Left upper leg Bathing: 4: Min-Patient completes 8-9 63f 10 parts or 75+ percent  FIM - Upper Body Dressing/Undressing Upper body dressing/undressing steps patient completed: Thread/unthread right sleeve of pullover shirt/dresss;Thread/unthread left sleeve of pullover shirt/dress;Put head through opening of pull over shirt/dress;Pull shirt over trunk Upper body dressing/undressing: 5: Set-up assist to: Obtain clothing/put away FIM - Lower Body Dressing/Undressing Lower body dressing/undressing steps patient completed: Thread/unthread left pants leg;Pull pants up/down Lower body dressing/undressing: 4: Min-Patient completed 75 plus % of tasks  FIM - Toileting Toileting: 0: Activity did not occur  FIM - Archivist Transfers: 3-To toilet/BSC: Mod A (lift or lower assist);3-From toilet/BSC: Mod A (lift or lower assist)  FIM - Press photographer Assistive Devices: Arm rests Bed/Chair Transfer: 5: Supine > Sit: Supervision (verbal cues/safety issues);5: Sit > Supine: Supervision (verbal cues/safety issues);3: Chair or W/C > Bed: Mod A (lift or lower  assist)  FIM - Locomotion: Wheelchair Distance: 120' Locomotion: Wheelchair: 1: Travels less than 50 ft with minimal assistance (Pt.>75%) FIM - Locomotion: Ambulation Locomotion:  Ambulation Assistive Devices: Designer, industrial/product Ambulation/Gait Assistance:  (Max A for sit to stand in RW, unable to assume upright postu) Locomotion: Ambulation: 0: Activity did not occur  Comprehension Comprehension Mode: Auditory Comprehension: 5-Follows basic conversation/direction: With no assist  Expression Expression Mode: Verbal Expression: 5-Expresses basic needs/ideas: With no assist  Social Interaction Social Interaction: 5-Interacts appropriately 90% of the time - Needs monitoring or encouragement for participation or interaction.  Problem Solving Problem Solving: 4-Solves basic 75 - 89% of the time/requires cueing 10 - 24% of the time  Memory Memory Assistive Devices: Memory book Memory: 6-Assistive device: No helper   LOS (Days) 5 A FACE TO FACE EVALUATION WAS PERFORMED 1. Left transmetatarsal amputation/femoropopliteal bypass secondary to peripheral vascular disease  2. DVT Prophylaxis/Anticoagulation: SCDs to right lower extremity  3. Pain Management: Neurontin increase to 200mg , Percocet increase to 10mg . Schedule doses before activity. (d/ced hydrocodone) Hesitate starting long-acting agent given cognitive status (STM deficits) 4. Neuropsych: This patient is capable of making decisions on his/her own behalf.  5. End stage renal disease. Continue dialysis as per [facility]. HD at the end of therapy day.  6. Hypertension. Norvasc 10 mg daily, Imdur 30 mg daily, Lopressor 100 mg twice a day. Monitor with increased mobility  7. Hyperlipidemia. Zocor  8. History congestive heart failure. Monitor for any signs of fluid overload  9. Chronic anemia. Aranesp. Followup CBC with dialysis 10. Wound- petroleum dressing, 4x4, kerlix, ACE daily.   -Hydrotherapy to sacral  wound. 11. Ceruminosis on debrox may need ENT to lavage if it doesn't clear  Waylen Depaolo E 09/21/2011, 7:33 AM

## 2011-09-22 ENCOUNTER — Inpatient Hospital Stay (HOSPITAL_COMMUNITY): Payer: Medicare Other

## 2011-09-22 LAB — CBC
HCT: 27 % — ABNORMAL LOW (ref 39.0–52.0)
Hemoglobin: 8.4 g/dL — ABNORMAL LOW (ref 13.0–17.0)
MCV: 91.8 fL (ref 78.0–100.0)
RDW: 20 % — ABNORMAL HIGH (ref 11.5–15.5)
WBC: 8.3 10*3/uL (ref 4.0–10.5)

## 2011-09-22 LAB — RENAL FUNCTION PANEL
BUN: 24 mg/dL — ABNORMAL HIGH (ref 6–23)
CO2: 27 mEq/L (ref 19–32)
Chloride: 98 mEq/L (ref 96–112)
Creatinine, Ser: 5.4 mg/dL — ABNORMAL HIGH (ref 0.50–1.35)
Glucose, Bld: 101 mg/dL — ABNORMAL HIGH (ref 70–99)

## 2011-09-22 MED ORDER — OXYCODONE-ACETAMINOPHEN 5-325 MG PO TABS
ORAL_TABLET | ORAL | Status: AC
  Administered 2011-09-22: 2 via ORAL
  Filled 2011-09-22: qty 1

## 2011-09-22 MED ORDER — OXYCODONE-ACETAMINOPHEN 5-325 MG PO TABS
ORAL_TABLET | ORAL | Status: AC
  Administered 2011-09-23: 2 via ORAL
  Filled 2011-09-22: qty 1

## 2011-09-22 MED ORDER — DOXERCALCIFEROL 4 MCG/2ML IV SOLN
INTRAVENOUS | Status: AC
  Filled 2011-09-22: qty 4

## 2011-09-22 NOTE — Progress Notes (Signed)
Per State Regulation 482.30 This chart was reviewed for medical necessity with respect to the patient's Admission/Duration of stay. Pt participating in therapies with steady progress. Contact precautions d/c'd today. Meryl Dare                 Nurse Care Manager            Next Review Date: 09/26/11

## 2011-09-22 NOTE — Progress Notes (Signed)
Occupational Therapy Session Notes  Patient Details  Name: [name] MRN: [id number] Date of Birth: [birth year]  Today's Date: 09/22/2011  Short Term Goals: Week 1:  OT Short Term Goal 1 (Week 1): Short Term Goals = Long Term Goals secondary to ELOS  Skilled Therapeutic Interventions/Progress Updates:   Session #1 [phone] - 40 Minutes Individual Therapy Patient with 6/10 pain around sacrum/wound area; RN aware Patient found supine in bed ready for ADL retraining. Patient refused sink level for retraining and preferred to complete at bed level. Patient engaged in bed mobility to complete UB/LB bathing & dressing. Focused skilled intervention on overall activity tolerance/endurance, bed mobility, UB/LB bathing & dressing (lateral leans for peri care and to pull up pants), and edge of bed -> w/c squat pivot transfer. Patient with poor carryover regarding NWB throughout LLE and requires cues for NWB. Left patient seated in w/c with call bell & phone within reach.   Session #2 [id number] - 40 Minutes Individual Therapy Patient missed 20 minutes of skilled occupational therapy secondary to nausea and heartburn; RN aware Upon entering room patient seated in w/c. Patient willing to participate in therapy. Patient propelled self -> therapy gym with multiple rest breaks. Once in therapy gym patient with complaints of being nauseated and wanting a trash can. After a few minutes of rest patient requested to go back to his room. Therapist propelled patient -> room and patient stated he needed to use restroom. Min assist for transfer -> drop arm BSC and supervision BSC -> w/c. Patient required supervision for safety and to maintain NWB status. After toileting patient requested to get back to bed secondary to nausea and heartburn. Assisted patient back to bed and notified RN of complaints.   Precautions:  Precautions Precautions: Fall Precaution Comments: s/p L transmet amp (stage III sacral  wound) Restrictions Weight Bearing Restrictions: Yes LLE Weight Bearing: Non weight bearing  See FIM for current functional status  Therapy/Group: Individual Therapy  [name] 09/22/2011, 11:39 AM

## 2011-09-22 NOTE — Progress Notes (Signed)
Physical Therapy Note  Patient Details  Name: ANDONI BUSCH MRN: [id number] Date of Birth: March 11, 1951 Today's Date: 09/22/2011  0955-1050 (55 minutes) individual Pain: no complaint of pain Focus of treatment: Bilateral UE strengthening to decrease assist with basic transfers; transfer training; sit  To stand /standing tolerance with RW  Treatment: Transfer be > wc min assist to prevent weight bearing LT LE ; attempted pushup blocks- pt unable to perform ; seated push ups using lower blocks- pushups 3 X 5 ; UE strengthening using green theraband 2 X 10 - scapular adduction, shoulder depression, elbow extension, bicep curls; sit to stand from raised mat to RW mod assist with vcs for hand placement. Pt able to stand < 45 seconds before c/o fatigue.   Nubia Ziesmer,JIM 09/22/2011, 10:45 AM

## 2011-09-22 NOTE — Plan of Care (Signed)
Problem: Consults Goal: RH LIMB LOSS PATIENT EDUCATION Description: See Patient Education module for eduction specifics.  Outcome: Not Progressing Patient hard of hearing and appears to have difficulty attaining information.    Problem: RH SKIN INTEGRITY Goal: RH STG MAINTAIN SKIN INTEGRITY WITH ASSISTANCE STG Maintain Skin Integrity With Min Assistance.  Outcome: Progressing Needs multiple cueing and repositioning

## 2011-09-22 NOTE — Progress Notes (Signed)
1300 Spoke with Misty Stanley, RN in Infection Control.  Verbal "okay" to DC contact precautions with continued monitoring for signs and symptoms of C.diff.

## 2011-09-22 NOTE — Progress Notes (Signed)
Assessment/Plan:  1. ESRD: continue MWF HD via tunneled cath (apparently no other options). AVF clotted in R upper arm. Old non-fct accesses in L arm. Regular dialysis unit is [facility]. Plan for next HD Monday--today  2. Sepsis Syndrome with C diff colitis/PNA: Improved - now off PO vanco for C diff.  3. PVD- S/p L fem-pop and mid foot amputation last week. Rehab and HD today. He lives alone. 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  5.. HTN/volume- controlled with HD and meds. BP OK on amlodipine 10/d (dose was not decreased to 5/d yesterday) + metoprolol 100 BID. Wt to 60 kg in HD on 1 Jul  7. Anemia- hgb 8.5 yesterday. Fe/TIBC 30 % sat, ferritin-2323 on 24 Jun. On aranesp 100/wk--no IV Fe . Increase aranesp to 150/wk  8. Dispo-continue rehab. Back to [place] (home) at time of d/c [facility] dialysis)   Subjective: Interval History: has no complaint of wanting to "get out of here".  Objective: Vital signs in last 24 hours: Temp:  [98.2 F (36.8 C)-98.4 F (36.9 C)] 98.4 F (36.9 C) (07/01 0630) Pulse Rate:  [61-63] 61  (07/01 0630) Resp:  [18-19] 18  (07/01 0630) BP: (136-188)/(72-74) 188/72 mmHg (07/01 0630) SpO2:  [95 %-100 %] 95 % (07/01 0630) Weight:  [62.914 kg (138 lb 11.2 oz)] 62.914 kg (138 lb 11.2 oz) (07/01 0630) Weight change:   Intake/Output from previous day: 06/30 0701 - 07/01 0700 In: 240 [P.O.:240] Out: -  Intake/Output this shift: Total I/O In: 120 [P.O.:120] Out: -   General appearance: alert, cooperative and appears stated age Lungs clear Cor RRR Abd Soft Ext 1-2+ LLE, tr-1+ RLE Oreinted and appropriate  Lab Results: No results found for this basename: WBC:2,HGB:2,HCT:2,PLT:2 in the last 72 hours BMET: No results found for this basename: NA:2,K:2,CL:2,CO2:2,GLUCOSE:2,BUN:2,CREATININE:2,CALCIUM:2 in the last 72 hours No results found for this basename: PTH:2 in the last 72 hours Iron Studies: No results found for this basename:  IRON,TIBC,TRANSFERRIN,FERRITIN in the last 72 hours Studies/Results: No results found.  I have reviewed the patient's current medications.    LOS: 6 days   Koa Palla C 09/22/2011,1:08 PM

## 2011-09-22 NOTE — Progress Notes (Signed)
Hydrotherapy Note   09/22/11 1100  Subjective Assessment  Subjective "Just go ahead and do what you need to do."    Evaluation and Treatment  Evaluation and Treatment Procedures Explained to Patient/Family Yes  Evaluation and Treatment Procedures agreed to  Pressure Ulcer 08/27/11 Stage II -  Partial thickness loss of dermis presenting as a shallow open ulcer with a red, pink wound bed without slough.  Date First Assessed/Time First Assessed: 08/27/11 0800   Location: Sacrum  Staging: Stage II -  Partial thickness loss of dermis presenting as a shallow open ulcer with a red, pink wound bed without slough.  State of Healing Early/partial granulation  Site / Wound Assessment Pink;Yellow  % Wound base Red or Granulating 10%  % Wound base Yellow 90%  Peri-wound Assessment Intact  Margins Unattacted edges (unapproximated)  Drainage Amount Minimal  Drainage Description Purulent  Treatment Hydrotherapy (Pulse lavage)  Dressing Type ABD;Barrier Film (skin prep);Moist to dry (Santyl)  Dressing Changed  Hydrotherapy  Pulsed lavage therapy - wound location Sacrum  Pulsed Lavage with Suction (psi) 8 psi  Pulsed Lavage with Suction - Normal Saline Used 1000 mL  Pulsed Lavage Tip Tip with splash shield  Selective Debridement  Selective Debridement - Location sacrum  Selective Debridement - Tools Used Forceps;Scissors  Selective Debridement - Tissue Removed yellow slough  Wound Therapy - Assess/Plan/Recommendations  Wound Therapy - Clinical Statement Slow improvement continues.  Wound Therapy - Functional Problem List Decreased mobility  Factors Delaying/Impairing Wound Healing Immobility;Multiple medical problems;Polypharmacy  Hydrotherapy Plan Debridement;Dressing change;Patient/family education;Pulsatile lavage with suction  Wound Therapy - Frequency 6X / week  Wound Therapy - Follow Up Recommendations Home health RN  Wound Therapy Goals - Improve the function of patient's integumentary  system by progressing the wound(s) through the phases of wound healing by:  Decrease Necrotic Tissue - Progress Not progressing  Increase Granulation Tissue - Progress Mot progressing    Mack Hook, PT [phone]

## 2011-09-22 NOTE — Progress Notes (Signed)
Data charted in error on wrong time, for 1958

## 2011-09-22 NOTE — Progress Notes (Signed)
Patient ID: [name], male   DOB: 08-08-1950, 61 y.o.   MRN: [id number] Patient ID: [name], male   DOB: 1950-11-18, 61 y.o.   MRN: [id number]  Subjective/Complaints: Pain an issue at times, but never appears to be in severe pain when i examine him. Ear wax noted. No signs of lethargy or confusion Other ros items reviewed and otherwise negative  Objective: Vital Signs: Blood pressure 188/72, pulse 61, temperature 98.4 F (36.9 C), temperature source Oral, resp. rate 18, height 5\' 6"  (1.676 m), weight 62.914 kg (138 lb 11.2 oz), SpO2 95.00%. No results found. No results found for this basename: WBC:2,HGB:2,HCT:2,PLT:2 in the last 72 hours No results found for this basename: NA:2,K:2,CL:2,CO2:2,GLUCOSE:2,BUN:2,CREATININE:2,CALCIUM:2 in the last 72 hours CBG (last 3)  No results found for this basename: GLUCAP:3 in the last 72 hours  Wt Readings from Last 3 Encounters:  09/22/11 62.914 kg (138 lb 11.2 oz)  09/15/11 61.6 kg (135 lb 12.9 oz)  09/15/11 61.6 kg (135 lb 12.9 oz)    Physical Exam:  General appearance: alert, cooperative and no distress Head: Normocephalic, without obvious abnormality, atraumatic Eyes: sclera, conjunctiva clear Ears: normal external Nose: Nares normal. Septum midline. Mucosa normal. No drainage or sinus tenderness. Throat: lips, mucosa, and tongue normal; teeth and gums normal Neck: no adenopathy, no carotid bruit, no JVD, supple, symmetrical, trachea midline and thyroid not enlarged, symmetric, no tenderness/mass/nodules Back: symmetric, no curvature. ROM normal. No CVA tenderness. Resp: clear to auscultation bilaterally Cardio: regular rate and rhythm, S1, S2 normal, no murmur, click, rub or gallop GI: soft, non-tender; bowel sounds normal; no masses,  no organomegaly Extremities: extremities normal, atraumatic, no cyanosis or edema Pulses: 2+ and symmetric Skin: see below Neurologic: Grossly normal- short term memory deficits. Non-focal  weakness except for pain inhibition in the left  Incision/Wound: left foot incision with serosanginous drainage. Wound fairly well approximated. The remainder of the foot is intact. Right 1st toe with a 1 inch area c/w with early breakdown/bloood blister. Stage II sacral wound persistent.   Assessment/Plan: 1. Functional deficits secondary to left TMA/BPG with deconditioning  which require 3+ hours per day of interdisciplinary therapy in a comprehensive inpatient rehab setting. Physiatrist is providing close team supervision and 24 hour management of active medical problems listed below. Physiatrist and rehab team continue to assess barriers to discharge/monitor patient progress toward functional and medical goals. FIM: FIM - Bathing Bathing Steps Patient Completed: Chest;Right Arm;Left Arm;Abdomen;Front perineal area;Right upper leg;Left upper leg Bathing: 0: Activity did not occur  FIM - Upper Body Dressing/Undressing Upper body dressing/undressing steps patient completed: Thread/unthread right sleeve of pullover shirt/dresss;Thread/unthread left sleeve of pullover shirt/dress;Put head through opening of pull over shirt/dress;Pull shirt over trunk Upper body dressing/undressing: 0: Activity did not occur FIM - Lower Body Dressing/Undressing Lower body dressing/undressing steps patient completed: Thread/unthread left pants leg;Pull pants up/down Lower body dressing/undressing: 0: Activity did not occur  FIM - Toileting Toileting: 0: Activity did not occur  FIM - Archivist Transfers: 0-Activity did not occur  FIM - Banker Devices: Arm rests Bed/Chair Transfer: 5: Supine > Sit: Supervision (verbal cues/safety issues);5: Sit > Supine: Supervision (verbal cues/safety issues);3: Chair or W/C > Bed: Mod A (lift or lower assist)  FIM - Locomotion: Wheelchair Distance: 120' Locomotion: Wheelchair: 1: Travels less than 50 ft with minimal  assistance (Pt.>75%) FIM - Locomotion: Ambulation Locomotion: Ambulation Assistive Devices: Designer, industrial/product Ambulation/Gait Assistance:  (Max A for sit to stand  in RW, unable to assume upright postu) Locomotion: Ambulation: 0: Activity did not occur  Comprehension Comprehension Mode: Auditory Comprehension: 5-Follows basic conversation/direction: With extra time/assistive device  Expression Expression Mode: Verbal Expression: 5-Expresses basic needs/ideas: With extra time/assistive device  Social Interaction Social Interaction: 5-Interacts appropriately 90% of the time - Needs monitoring or encouragement for participation or interaction.  Problem Solving Problem Solving: 5-Solves basic problems: With no assist  Memory Memory Assistive Devices: Memory book Memory: 6-More than reasonable amt of time   LOS (Days) 6 A FACE TO FACE EVALUATION WAS PERFORMED 1. Left transmetatarsal amputation/femoropopliteal bypass secondary to peripheral vascular disease  2. DVT Prophylaxis/Anticoagulation: SCDs to right lower extremity  3. Pain Management: Neurontin increase to 200mg , Percocet increase to 10mg . Schedule doses before activity. (d/ced hydrocodone)  -i question the validity of his pain "scores" as he's not a great historian and has STM deficits.  4. Neuropsych: This patient is capable of making decisions on his/her own behalf.  5. End stage renal disease. Continue dialysis as per [facility]. HD at the end of therapy day.  6. Hypertension. Norvasc 10 mg daily, Imdur 30 mg daily, Lopressor 100 mg twice a day. Monitor with increased mobility  7. Hyperlipidemia. Zocor  8. History congestive heart failure. Monitor for any signs of fluid overload  9. Chronic anemia. Aranesp. Followup CBC with dialysis 10. Wound- petroleum dressing, 4x4, kerlix, ACE daily.   -Hydrotherapy to sacral wound. 11. Ceruminosis - continue debrox   =may need ENT to lavage if it doesn'[name] clear     [name] 09/22/2011, 8:03 AM

## 2011-09-23 MED ORDER — HEPARIN SODIUM (PORCINE) 1000 UNIT/ML DIALYSIS: 20.0000 [IU]/kg | INTRAMUSCULAR | Status: DC | PRN

## 2011-09-23 MED ORDER — PRO-STAT SUGAR FREE PO LIQD
30.0000 mL | Freq: Two times a day (BID) | ORAL | Status: DC
  Administered 2011-09-23 – 2011-09-24 (×2): 30 mL via ORAL
  Filled 2011-09-23 (×10): qty 30

## 2011-09-23 MED ORDER — OXYCODONE-ACETAMINOPHEN 5-325 MG PO TABS
2.0000 | ORAL_TABLET | Freq: Every day | ORAL | Status: DC
  Administered 2011-09-23 – 2011-09-25 (×3): 2 via ORAL
  Filled 2011-09-23 (×7): qty 2

## 2011-09-23 NOTE — Progress Notes (Signed)
Assessment/Plan:  1. ESRD: continue MWF HD via tunneled cath (apparently no other options). AVF clotted in R upper arm. Old non-fct accesses in L arm. Regular dialysis unit is [facility]. Plan for next HD--Wed  2. Sepsis Syndrome with C diff colitis/PNA: Improved  3. PVD- S/p L fem-pop and mid foot amputation.. 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  5.. HTN/volume-  7. Anemia- 8. Dispo-continue rehab. Back to [place] (home) at time of d/c [facility] dialysis)    Subjective: Interval History: Had HD yesterday and aprox 2 liters removed.  Objective: Vital signs in last 24 hours: Temp:  [98 F (36.7 C)-98.2 F (36.8 C)] 98.2 F (36.8 C) (07/02 0551) Pulse Rate:  [60-68] 62  (07/02 0551) Resp:  [18-19] 18  (07/02 0551) BP: (100-140)/(48-67) 138/52 mmHg (07/02 0551) SpO2:  [96 %-100 %] 100 % (07/02 0551) Weight:  [60.1 kg (132 lb 7.9 oz)-62.2 kg (137 lb 2 oz)] 61.372 kg (135 lb 4.8 oz) (07/02 0551) Weight change: -0.714 kg (-1 lb 9.2 oz)  Intake/Output from previous day: 07/01 0701 - 07/02 0700 In: 360 [P.O.:360] Out: 4008  Intake/Output this shift:    Well appearing, resting Lungs clear L IJ PC Cor RRR Abd Soft !+ edema LLE, No edema RLE  Lab Results:  Basename 09/22/11 1500  WBC 8.3  HGB 8.4*  HCT 27.0*  PLT 215   BMET:  Basename 09/22/11 1500  NA 138  K 3.7  CL 98  CO2 27  GLUCOSE 101*  BUN 24*  CREATININE 5.40*  CALCIUM 9.0   No results found for this basename: PTH:2 in the last 72 hours Iron Studies: No results found for this basename: IRON,TIBC,TRANSFERRIN,FERRITIN in the last 72 hours Studies/Results: No results found.  I have reviewed the patient's current medications.   LOS: 7 days   Brehanna Deveny C 09/23/2011,1:39 PM

## 2011-09-23 NOTE — Progress Notes (Signed)
Occupational Therapy Note  Patient Details  Name: [name] MRN: [id number] Date of Birth: 13-Mar-1951 Today's Date: 09/23/2011  Time: 1400-1430 Pt denied pain initially but c/o increased pain in buttocks with activity; RN aware Individual Therapy  Pt practiced w/c<>tub bench transfers with squat/scoot pivot transfer.  Pt unable to boost buttocks enough to clear w/c wheel and required mod A for transfer both ways.  Pt transitioned to BUE exercises with 1# weight bar.  Pt required rest breaks X 2 min between 8 rep sets of exercises.  Pt rolled w/c approx 25' after exercises.  Focus on transfers while maintaining weight bearing restrictions and activity tolerance.   Lavone Neri [facility] 09/23/2011, 3:47 PM

## 2011-09-23 NOTE — Progress Notes (Addendum)
Physical Therapy Session Note  Patient Details  Name: [name] MRN: [id number] Date of Birth: [birth year]  Today's Date: 09/23/2011 Time: 1430-1530 Time Calculation (min): 60 min  Short Term Goals: See d/c goals  Skilled Therapeutic Interventions/Progress Updates:    Transfer training simulating w/c to dialysis chair, car transfer stand pivots and squat pivots  W/c assessment- pt will require ultra light weight w/c d/t decreased UE strength and impaired activity tolerance that fluctuates with dialysis; pt will require skin protection cushion d/t sacral wound, decreased mobility and impaired weight shift/pressure relief- pt fitted into smaller 16x16 w/c which width allowed for better push stroke and use of arms for repositioning, pt positioned on ROHO cushion for trial- he preferred this over previous gel cushion- family will need to maintain cushion as pt will likely not be able/reliable to do so as decreased carryover has been seen in therapies (discussion and assist with SPT Kim)  Pt propelled w/c back to room but wanted to keep trunk flexed forward which increased energy expenditure, did a bit better with towel as lumbar support-may benefit from Jardine Go back for positioning  Pt assisted back to bed into sidelying for pressure relief at end of session and took pain med.  Goals discussed with SPT, some downgraded, pt standing is very limited with poor tolerance at this time Therapy Documentation Precautions:  Precautions Precautions: Fall Precaution Comments: sacral area wound, provide pressure relief Restrictions Weight Bearing Restrictions: Yes LLE Weight Bearing: Non weight bearing   Pain: Pain Assessment Pain Assessment: 0-10 Pain Score:   7 Pain Location: Buttocks (wound) Pain Intervention(s): RN made aware;Repositioned   Other Treatments: Treatments Therapeutic Activity: car transfer training stand pivot with PT in front and squat pivot mod A  See FIM for current  functional status  Therapy/Group: Individual Therapy  Michaelene Song 09/23/2011, 3:53 PM

## 2011-09-23 NOTE — Patient Care Conference (Signed)
Inpatient RehabilitationTeam Conference Note Date: 09/23/2011   Time: 2:35 PM    Patient Name: [name]      Medical Record Number: [id number]  Date of Birth: December 03, 1950 Sex: Male         Room/Bed: 4007/4007-01 Payor Info: Payor: MEDICARE  Plan: MEDICARE PART A AND B  Product Type: *No Product type*     Admitting Diagnosis: L Trans metatarsal amp due to PVD  Admit Date/Time:  09/16/2011  1:24 PM Admission Comments: No comment available   Primary Diagnosis:  Amputation of leg Principal Problem: Amputation of leg  Patient Active Problem List   Diagnosis Date Noted  . Amputation of leg 09/16/2011  . Anemia, chronic renal failure - baseline hgb 9-10 09/10/2011  . Ischemia of left foot 09/10/2011  . Acute respiratory failure with hypoxia 08/27/2011  . Septic shock 08/27/2011  . Nausea and vomiting 08/27/2011  . Constipation 08/27/2011  . Hospital-acquired pneumonia 08/27/2011  . Metabolic encephalopathy 08/27/2011  . SBO (small bowel obstruction) 08/14/2011  . Pneumonia 08/14/2011  . C. difficile colitis- recalcitrant 08/14/2011  . Atrial fibrillation 08/14/2011  . Atrial flutter 08/14/2011  . CAD (coronary artery disease) 08/14/2011  . Pleural effusion 08/05/2011  . End stage renal disease 08/04/2011  . Occlusion and stenosis of carotid artery without mention of cerebral infarction 08/04/2011  . Peripheral vascular disease, unspecified 08/04/2011  . History of tobacco abuse   . Peripheral vascular occlusive disease   . Dyspnea on exertion   . CKD (chronic kidney disease) stage V requiring chronic dialysis   . Hyperlipidemia   . Hypertension   . Atherosclerosis of native arteries of the extremities with intermittent claudication 07/21/2011  . Chronic diastolic CHF (congestive heart failure) 07/23/2010  . INSOMNIA, CHRONIC 08/10/2008  . CVA 07/22/2008    Expected Discharge Date: Expected Discharge Date: 09/26/11  Team Members Present: Physician: Dr. Faith Rogue Case Manager Present: Lutricia Horsfall, RN Social Worker Present: Amada Jupiter, LCSW Nurse Present: Other (comment) Berta Minor, RN) PT Present: Other (comment) Sherrine Maples PT) OT Present: Edwin Cap, Felipa Eth, OT Tora Duck, RN, PPS Coord.    Current Status/Progress Goal Weekly Team Focus  Medical   wound care issues, pain associated with hydrotherapy.   better pain mgt with activities and hydrotherapy  balancing cognition with pain relief   Bowel/Bladder   patient is continent of bowel and bladder (anuric); LBM 6/28  patient will remain continent  assess need for medication   Swallow/Nutrition/ Hydration             ADL's   overall min-> supervision  overall supervision  ADL retraining, overall activity tolerance/endurance, pain management   Mobility   Overall min A to supervision  Overall Mod I w/c level, Min A car  activity tolerance, endurance, strengthening, transfers, bed mobility, w/c management, stand-pivot transfers in RW, family ed, car transfers   Communication             Safety/Cognition/ Behavioral Observations  patient needs lots of cueing; poor initiation; memory deficit at times  min assist   assess Q shift   Pain   patient states intermittenet pain surgical LLE and constant pain in buttocks (wound)  patient will state pain "less than 3"  reassess need for pain medication   Skin   Stage IV pressure ulcer; hydrotherapy wound treatment ordered daily M-Sat  Wound will begin to heal and remain free of infection and patient will reposition self for healing   monitor Q  shift      *See Interdisciplinary Assessment and Plan and progress notes for long and short-term goals  Barriers to Discharge: stm deficits, limited safety awareness    Possible Resolutions to Barriers:  family ed, equipment, try darco shoe    Discharge Planning/Teaching Needs:         Team Discussion:  Discussion of pt's dx, medical issues. Sacral wound,  receiving hydrotherapy. Pt reports mostly only sacral pain. Poor initaitive. Slow progress. Poor compliance with NWB. Need family to come in for education. Goals are S.  Revisions to Treatment Plan: none    Continued Need for Acute Rehabilitation Level of Care: The patient requires daily medical management by a physician with specialized training in physical medicine and rehabilitation for the following conditions: Daily direction of a multidisciplinary physical rehabilitation program to ensure safe treatment while eliciting the highest outcome that is of practical value to the patient.: Yes Daily medical management of patient stability for increased activity during participation in an intensive rehabilitation regime.: Yes Daily analysis of laboratory values and/or radiology reports with any subsequent need for medication adjustment of medical intervention for : Neurological problems;Post surgical problems;Other  Meryl Dare 09/23/2011, 2:51 PM

## 2011-09-23 NOTE — Consult Note (Signed)
Wound care follow-up:  Stage 4 wound continues to receive hydrotherapy and Santyl  to assist with removal of nonviable tissue.  Assessed wound during physical therapy wound care session.  [id number] with tunneling to 1.2cm at 12 o'clock.  Wound 80% yellow, 20% red.  Mod yellow-green drainage with slight odor.  Continue present plan of care to promote healing.  Cammie Mcgee, RN, MSN, [employer]  [phone]

## 2011-09-23 NOTE — Progress Notes (Signed)
Reviewed and in agreement with treatment provided.  

## 2011-09-23 NOTE — Progress Notes (Signed)
Occupational Therapy Session Note  Patient Details  Name: [name] MRN: [id number] Date of Birth: 1950-05-21  Today's Date: 09/23/2011 Time: 4098-1191 Time Calculation (min): 40 min  Short Term Goals: Week 1:  OT Short Term Goal 1 (Week 1): Short Term Goals = Long Term Goals secondary to ELOS  Skilled Therapeutic Interventions/Progress Updates:   Patient with 5/10 pain around would/sacrum area; RN aware Individual Therapy Patient found supine in bed without clothes donned. Engaged in ADL retraining at bed level, supervision for UB/LB ADLs. Patient then transferred from edge of bed -> w/c for education, demonstration, & return demonstration regarding w/c management with w/c brakes and w/c bilateral leg rests. Patient left seated in w/c with call bell & phone within reach.   Precautions:  Precautions Precautions: Fall Precaution Comments: s/p L transmet amp (stage III sacral wound) Restrictions Weight Bearing Restrictions: Yes LLE Weight Bearing: Non weight bearing  See FIM for current functional status  Therapy/Group: Individual Therapy  Elzie Knisley 09/23/2011, 12:03 PM

## 2011-09-23 NOTE — Progress Notes (Signed)
Physical Therapy Session Note  Patient Details  Name: [name] MRN: [id number] Date of Birth: 1950-08-20  Today's Date: 09/23/2011 Time: 0900-0930 Time Calculation (min): 30 min  Short Term Goals: Week 1:  PT Short Term Goal 1 (Week 1): Pt will maintain NWB status of LLE 75% of the time without cueing. PT Short Term Goal 2 (Week 1): Pt will perform supine<>sit transfers with Min A. PT Short Term Goal 3 (Week 1): Pt will perform w/c<>bed transfers with Min A.  Skilled Therapeutic Interventions/Progress Updates:    Tx focused on bed mobility, with pt continuing to require verbal and tactile cues to maintain NWB on LLE.  Performed even-level squat pivot transfer to w/c with cueing needed for w/c parts and management and safety.  Pt educated on w/c parts and how to don/doff leg rests.  Pt self-propelled w/c >150' for improved UE strengthening, endurance, and activity tolerance.  Focused on proper technique with sit to stand in RW and stand-pivot transfer to wc<>mat. Pt is able to shuffle R foot for transfer but requires Mod A to maintain upright standing position,stability, and NWB status.  Discussed the importance of maintaining NWB status as well as the complications or consequences of not obeying precautions.  Pt verbalized understanding.  Pt will benefit from continued practice with stand-pivot transfers and improving standing tolerance.  Pt reports he will be going home to his house (level entrance) upon d/c with his son providing 24-hour assist.  Pt will most likely need to perform stand-pivot transfer to assist with getting in/out of his son's SUV.     Therapy Documentation Precautions:  Precautions Precautions: Fall Precaution Comments: s/p L transmet amp (stage III sacral wound) Restrictions Weight Bearing Restrictions: Yes LLE Weight Bearing: Non weight bearing  Pain: Pt reports 9/10 pain of sacral wound- premedicated.  No complaints of pain through LLE.  See FIM for current  functional status  Therapy/Group: Individual Therapy  Deirdre Pippins 09/23/2011, 12:38 PM

## 2011-09-23 NOTE — Progress Notes (Signed)
Subjective/Complaints: Pain mostly on "tailbone". Wax better, hearing better. Didn't sleep No signs of lethargy or confusion Other ros items reviewed and otherwise negative  Objective: Vital Signs: Blood pressure 138/52, pulse 62, temperature 98.2 F (36.8 C), temperature source Oral, resp. rate 18, height 5\' 6"  (1.676 m), weight 61.372 kg (135 lb 4.8 oz), SpO2 100.00%. No results found.  Basename 09/22/11 1500  WBC 8.3  HGB 8.4*  HCT 27.0*  PLT 215    Basename 09/22/11 1500  NA 138  K 3.7  CL 98  CO2 27  GLUCOSE 101*  BUN 24*  CREATININE 5.40*  CALCIUM 9.0   CBG (last 3)  No results found for this basename: GLUCAP:3 in the last 72 hours  Wt Readings from Last 3 Encounters:  09/23/11 61.372 kg (135 lb 4.8 oz)  09/15/11 61.6 kg (135 lb 12.9 oz)  09/15/11 61.6 kg (135 lb 12.9 oz)    Physical Exam:  General appearance: alert, cooperative and no distress Head: Normocephalic, without obvious abnormality, atraumatic Eyes: sclera, conjunctiva clear Ears: normal external- decreased wax Nose: Nares normal. Septum midline. Mucosa normal. No drainage or sinus tenderness. Throat: lips, mucosa, and tongue normal; teeth and gums normal Neck: no adenopathy, no carotid bruit, no JVD, supple, symmetrical, trachea midline and thyroid not enlarged, symmetric, no tenderness/mass/nodules Back: symmetric, no curvature. ROM normal. No CVA tenderness. Resp: clear to auscultation bilaterally Cardio: regular rate and rhythm, S1, S2 normal, no murmur, click, rub or gallop GI: soft, non-tender; bowel sounds normal; no masses,  no organomegaly Extremities: extremities normal, atraumatic, no cyanosis or edema Pulses: 2+ and symmetric Skin: see below Neurologic: Grossly normal- short term memory deficits. Non-focal weakness except for pain inhibition in the left  Incision/Wound: left foot incision with minimal serosanginous drainage. Wound fairly well approximated. The remainder of the foot  is intact. Right 1st toe with a 1 inch area c/w with early breakdown/bloood blister. Stage III with persistent fibronecrotic tissue (40%?) at sacrum.   Assessment/Plan: 1. Functional deficits secondary to left TMA/BPG with deconditioning  which require 3+ hours per day of interdisciplinary therapy in a comprehensive inpatient rehab setting. Physiatrist is providing close team supervision and 24 hour management of active medical problems listed below. Physiatrist and rehab team continue to assess barriers to discharge/monitor patient progress toward functional and medical goals. FIM: FIM - Bathing Bathing Steps Patient Completed: Chest;Right Arm;Left Arm;Abdomen;Front perineal area;Buttocks;Right upper leg;Left upper leg Bathing: 4: Min-Patient completes 8-9 59f 10 parts or 75+ percent  FIM - Upper Body Dressing/Undressing Upper body dressing/undressing steps patient completed: Thread/unthread right sleeve of pullover shirt/dresss;Thread/unthread left sleeve of pullover shirt/dress;Put head through opening of pull over shirt/dress;Pull shirt over trunk Upper body dressing/undressing: 5: Set-up assist to: Obtain clothing/put away FIM - Lower Body Dressing/Undressing Lower body dressing/undressing steps patient completed: Thread/unthread right pants leg;Thread/unthread left pants leg;Pull pants up/down;Don/Doff right sock Lower body dressing/undressing: 5: Supervision: Safety issues/verbal cues  FIM - Toileting Toileting: 0: Activity did not occur  FIM - Archivist Transfers: 0-Activity did not occur  FIM - Banker Devices: Bed rails Bed/Chair Transfer: 5: Supine > Sit: Supervision (verbal cues/safety issues);4: Bed > Chair or W/C: Min A (steadying Pt. > 75%)  FIM - Locomotion: Wheelchair Distance: 120' Locomotion: Wheelchair: 1: Travels less than 50 ft with minimal assistance (Pt.>75%) FIM - Locomotion: Ambulation Locomotion:  Ambulation Assistive Devices: Designer, industrial/product Ambulation/Gait Assistance:  (Max A for sit to stand in RW, unable to assume upright postu)  Locomotion: Ambulation: 0: Activity did not occur  Comprehension Comprehension Mode: Auditory Comprehension: 5-Follows basic conversation/direction: With extra time/assistive device (HOH)  Expression Expression Mode: Verbal Expression: 5-Expresses basic needs/ideas: With extra time/assistive device  Social Interaction Social Interaction: 5-Interacts appropriately 90% of the time - Needs monitoring or encouragement for participation or interaction.  Problem Solving Problem Solving: 5-Solves complex 90% of the time/cues < 10% of the time  Memory Memory Assistive Devices: Memory book Memory: 5-Recognizes or recalls 90% of the time/requires cueing < 10% of the time   LOS (Days) 7 A FACE TO FACE EVALUATION WAS PERFORMED 1. Left transmetatarsal amputation/femoropopliteal bypass secondary to peripheral vascular disease  2. DVT Prophylaxis/Anticoagulation: SCDs to right lower extremity  3. Pain Management: Neurontin increase to 200mg , Percocet increase to 10mg .   -percocet 30 min before hydrotherapy 4. Neuropsych: This patient is capable of making decisions on his/her own behalf.  5. End stage renal disease. Continue dialysis as per [facility]. HD at the end of therapy day.  6. Hypertension. Norvasc 10 mg daily, Imdur 30 mg daily, Lopressor 100 mg twice a day. Monitor with increased mobility  7. Hyperlipidemia. Zocor  8. History congestive heart failure. Monitor for any signs of fluid overload  9. Chronic anemia. Aranesp. Followup CBC with dialysis 10. Wound- petroleum dressing, 4x4, kerlix, ACE daily.   -Hydrotherapy to sacral wound. 11. Ceruminosis - continue debrox   =ears looking better     Alesandro Stueve T 09/23/2011, 7:33 AM

## 2011-09-23 NOTE — Progress Notes (Signed)
Back to room from HD via hospital bed.  Left chest HD cath capped and clamped.  Patient without distress.

## 2011-09-23 NOTE — Progress Notes (Signed)
Nutrition Follow-up  Intervention:   1. Recommend liberalized diet, pt does not consume renal diet PTA and diet was liberalized by MD during acute hospitalization to Low Sodium diet 2. Continue Nepro Shakes as able 3. 30 ml Prostat PO BID, each supplement provides 100 kcal and 15 grams protein.  Assessment:   Pt continues with hydrotherapy for Stage IV pressure ulcer. Pt continues to eat variably, ranging from 10 - 85%.  Diet Order:  Renal 60-70 Supplement: Nepro Shakes PO BID, each supplement provides 425 kcal and 19 grams protein.  Meds: Scheduled Meds:   . amLODipine  10 mg Oral Daily  . antiseptic oral rinse  15 mL Mouth Rinse q12n4p  . chlorhexidine  15 mL Mouth Rinse BID  . cinacalcet  60 mg Oral Q breakfast  . collagenase   Topical Daily  . darbepoetin (ARANESP) injection - DIALYSIS  150 mcg Intravenous Q Wed-HD  . docusate sodium  100 mg Oral Daily  . doxercalciferol  6 mcg Intravenous Q M,W,F-HD  . feeding supplement (NEPRO CARB STEADY)  237 mL Oral BID BM  . gabapentin  200 mg Oral QHS  . isosorbide mononitrate  30 mg Oral Daily  . metoprolol tartrate  100 mg Oral BID  . multivitamin  1 tablet Oral QHS  . oxyCODONE-acetaminophen  2 tablet Oral Daily  . saccharomyces boulardii  250 mg Oral BID  . simvastatin  5 mg Oral QHS  . sodium chloride  10-40 mL Intracatheter Q12H   Continuous Infusions:  PRN Meds:.acetaminophen, acetaminophen, aluminum hydroxide, bisacodyl, Glycerin (Adult), heparin, heparin, heparin, hydrALAZINE, labetalol, metoprolol, ondansetron (ZOFRAN) IV, ondansetron, ondansetron, oxyCODONE-acetaminophen, phenol, senna-docusate, sodium chloride, sorbitol  Labs:  CMP     Component Value Date/Time   NA 138 09/22/2011 1500   K 3.7 09/22/2011 1500   CL 98 09/22/2011 1500   CO2 27 09/22/2011 1500   GLUCOSE 101* 09/22/2011 1500   BUN 24* 09/22/2011 1500   CREATININE 5.40* 09/22/2011 1500   CALCIUM 9.0 09/22/2011 1500   PROT 5.4* 08/29/2011 0429   ALBUMIN 1.6* 09/22/2011  1500   AST 31 08/29/2011 0429   ALT 8 08/29/2011 0429   ALKPHOS 60 08/29/2011 0429   BILITOT 0.5 08/29/2011 0429   GFRNONAA 10* 09/22/2011 1500   GFRAA 12* 09/22/2011 1500   Phosphorus  Date/Time Value Range Status  09/22/2011  3:00 PM 5.3* 2.3 - 4.6 mg/dL Final  1/61/0960  4:54 PM 4.7* 2.3 - 4.6 mg/dL Final  0/98/1191  4:78 AM 4.5  2.3 - 4.6 mg/dL Final    Intake/Output Summary (Last 24 hours) at 09/23/11 1118 Last data filed at 09/23/11 0700  Gross per 24 hour  Intake    240 ml  Output   4008 ml  Net  -3768 ml  BM on 6/28  Weight Status:  61.4 kg s/p HD on 7/1 - stable 61.7 kg s/p HD on 6/24  Estimated needs:  2000 - 2200 kcal, 85 - 95 grams protein  Nutrition Dx:  Increased nutrient needs r/t wound healing and HD AEB estimated needs. Ongoing.  Goal:  Pt to consume at least 90% of estimated needs. On average.  Monitor:  weights, labs, PO intake, I/O's  Jarold Motto MS, RD, LDN Pager: [phone] After-hours pager: [phone]

## 2011-09-23 NOTE — Progress Notes (Signed)
Hydrotherapy Note   09/23/11 1045  Subjective Assessment  Subjective It hurts about a 5/10  Patient and Family Stated Goals get it better please  Evaluation and Treatment  Evaluation and Treatment Procedures Explained to Patient/Family Yes  Evaluation and Treatment Procedures agreed to  Pressure Ulcer 08/27/11 Stage II -  Partial thickness loss of dermis presenting as a shallow open ulcer with a red, pink wound bed without slough.  Date First Assessed/Time First Assessed: 08/27/11 0800   Location: Sacrum  Staging: Stage II -  Partial thickness loss of dermis presenting as a shallow open ulcer with a red, pink wound bed without slough.  State of Healing Early/partial granulation  Site / Wound Assessment Pink;Yellow  % Wound base Red or Granulating 20%  % Wound base Yellow 80%  % Wound base Black 0%  % Wound base Other (Comment) 0%  Peri-wound Assessment Erythema (non-blanchable)  Margins Unattacted edges (unapproximated)  Drainage Amount Minimal  Drainage Description Serous  Treatment Cleansed;Hydrotherapy (Pulse lavage);Packing (Saline gauze)  Dressing Type ABD (santyl)  Dressing Clean;Dry;Intact  Hydrotherapy  Pulsed lavage therapy - wound location Sacrum  Pulsed Lavage with Suction (psi) 8 psi  Pulsed Lavage with Suction - Normal Saline Used 1000 mL  Pulsed Lavage Tip Tip with splash shield  Selective Debridement  Selective Debridement - Location sacrum  Selective Debridement - Tools Used Forceps;Scissors  Selective Debridement - Tissue Removed yellow slough  Wound Therapy - Assess/Plan/Recommendations  Wound Therapy - Clinical Statement Slow improvement continues.  Wound Therapy - Functional Problem List Decreased mobility  Factors Delaying/Impairing Wound Healing Immobility;Multiple medical problems;Polypharmacy  Hydrotherapy Plan Debridement;Dressing change;Patient/family education;Pulsatile lavage with suction  Wound Therapy - Frequency 6X / week  Wound Therapy - Follow  Up Recommendations Home health RN  Wound Therapy Goals - Improve the function of patient's integumentary system by progressing the wound(s) through the phases of wound healing by:  Decrease Necrotic Tissue to 25  Decrease Necrotic Tissue - Progress Progressing toward goal  Increase Granulation Tissue - Progress Progressing toward goal  Time For Goal Achievement 7 days  Wound Therapy - Potential for Goals Good   09/23/2011  [place] Bing, PT [phone] (pager)

## 2011-09-24 ENCOUNTER — Inpatient Hospital Stay (HOSPITAL_COMMUNITY): Payer: Medicare Other

## 2011-09-24 DIAGNOSIS — S98919A Complete traumatic amputation of unspecified foot, level unspecified, initial encounter: Secondary | ICD-10-CM

## 2011-09-24 DIAGNOSIS — I739 Peripheral vascular disease, unspecified: Secondary | ICD-10-CM

## 2011-09-24 DIAGNOSIS — Z5189 Encounter for other specified aftercare: Secondary | ICD-10-CM

## 2011-09-24 DIAGNOSIS — L98499 Non-pressure chronic ulcer of skin of other sites with unspecified severity: Secondary | ICD-10-CM

## 2011-09-24 DIAGNOSIS — E1165 Type 2 diabetes mellitus with hyperglycemia: Secondary | ICD-10-CM

## 2011-09-24 DIAGNOSIS — N186 End stage renal disease: Secondary | ICD-10-CM

## 2011-09-24 LAB — RENAL FUNCTION PANEL
Albumin: 1.7 g/dL — ABNORMAL LOW (ref 3.5–5.2)
BUN: 29 mg/dL — ABNORMAL HIGH (ref 6–23)
CO2: 28 mEq/L (ref 19–32)
Calcium: 9.5 mg/dL (ref 8.4–10.5)
Chloride: 96 mEq/L (ref 96–112)
Creatinine, Ser: 5.61 mg/dL — ABNORMAL HIGH (ref 0.50–1.35)
GFR calc Af Amer: 11 mL/min — ABNORMAL LOW (ref >90)
GFR calc non Af Amer: 10 mL/min — ABNORMAL LOW (ref >90)
Glucose, Bld: 120 mg/dL — ABNORMAL HIGH (ref 70–99)
Phosphorus: 5 mg/dL — ABNORMAL HIGH (ref 2.3–4.6)
Potassium: 3.5 mEq/L (ref 3.5–5.1)
Sodium: 138 mEq/L (ref 135–145)

## 2011-09-24 LAB — CBC
HCT: 26.5 % — ABNORMAL LOW (ref 39.0–52.0)
Hemoglobin: 8.3 g/dL — ABNORMAL LOW (ref 13.0–17.0)
MCH: 28.8 pg (ref 26.0–34.0)
MCHC: 31.3 g/dL (ref 30.0–36.0)
MCV: 92 fL (ref 78.0–100.0)
Platelets: 195 10*3/uL (ref 150–400)
RBC: 2.88 MIL/uL — ABNORMAL LOW (ref 4.22–5.81)
RDW: 20.5 % — ABNORMAL HIGH (ref 11.5–15.5)
WBC: 8.9 10*3/uL (ref 4.0–10.5)

## 2011-09-24 MED ORDER — DOXERCALCIFEROL 4 MCG/2ML IV SOLN
INTRAVENOUS | Status: AC
  Administered 2011-09-24: 6 ug via INTRAVENOUS
  Filled 2011-09-24: qty 4

## 2011-09-24 MED ORDER — DARBEPOETIN ALFA-POLYSORBATE 150 MCG/0.3ML IJ SOLN
INTRAMUSCULAR | Status: AC
  Administered 2011-09-24: 150 ug via INTRAVENOUS
  Filled 2011-09-24: qty 0.3

## 2011-09-24 NOTE — Discharge Summary (Signed)
  Discharge summary job # [id number]

## 2011-09-24 NOTE — Procedures (Signed)
Currently on HD via L IJ tunneled cath BP 134/57, goal 2l Hgb 8.4 on 1 Jul, K 3.7 on 1 Jul.  Renal profile and CBC drawn at beginning of HD--pending. Plans for d/c on 5 Jul acc to Mr. Doenges.

## 2011-09-24 NOTE — Discharge Summary (Signed)
NAME:  [name], [name] NO.:  [id number]  MEDICAL RECORD NO.:  [id number]  LOCATION:  4007                         FACILITY:  [facility]  PHYSICIAN:  Ranelle Oyster, M.D.DATE OF BIRTH:  June 21, 1950  DATE OF ADMISSION:  09/16/2011 DATE OF DISCHARGE:  09/26/2011                              DISCHARGE SUMMARY   DISCHARGE DIAGNOSES: 1. Left transmetatarsal amputation with femoropopliteal bypass     secondary to peripheral vascular disease. 2. Sequential compression devices for deep vein thrombosis     prophylaxis, pain management, end-stage renal disease with     dialysis, hypertension, sacral ulcer, hyperlipidemia, congestive     heart failure, chronic anemia  This is a 61 year old right-handed male with history of peripheral vascular disease as well as end-stage renal disease, recently hospitalized for severe sepsis due to Clostridium difficile complicated by respiratory failure and sepsis.  He was recently discharged home on August 23, 2011.  Presented with ischemic left foot on June 17.  He had been followed by Vascular Surgery with plan for femoropopliteal bypass, but delayed due to recent Clostridium difficile.  He ultimately underwent femoropopliteal bypass September 09, 2011, per Dr. Myra Gianotti. Ongoing ischemic changes to the left lower extremity and ultimately underwent left transmetatarsal amputation September 12, 2011, per Dr. Myra Gianotti.  Postoperative pain management with anemia 7.8, hemodialysis ongoing.  The patient was admitted for comprehensive rehab program.  PAST MEDICAL HISTORY:  See discharge diagnoses.  ALLERGIES:  None.  SOCIAL HISTORY:  Lives alone.  He has family in the area to assist, 1 level home.  Functional history prior to admission was he did not drive. He is on disability.  He was able to navigate stairs.  Functional status upon admission to rehab service was total assist, +2 for stand pivot transfers, moderate assist scoot to the edge of the  bed.  PHYSICAL EXAMINATION:  VITAL SIGNS:  Blood pressure 178/82, pulse 74, temperature 97, respirations 18. GENERAL:  This is an alert male, oriented x3.  HEENT:  Normocephalic. NECK:  Supple.  Negative JVD. CARDIAC:  Rate controlled. ABDOMEN:  Soft, nontender.  Good bowel sounds. LUNGS:  Clear to auscultation. SKIN:  Surgical amputation site was clean and dressed.  REHABILITATION HOSPITAL COURSE:  The patient was admitted to inpatient rehab services with therapies initiated on a 3-hour daily basis consisting of physical therapy, occupational therapy and rehabilitation nursing.  The following issues were addressed during the patient's rehabilitation stay.  Pertaining to Mr. Lanuza's left transmetatarsal amputation, femoropopliteal bypass, remained stable.  Surgical site clean and dry.  He would follow up with Vascular Surgery Dr. Myra Gianotti. Arrangements also made to [facility].  He continued on Percocet for pain control, monitoring of mental status.  Also 1 Neurontin 200 mg at bedtime.  He continued on dialysis for end-stage renal disease per [facility].  Blood pressures monitored on Imdur as well as metoprolol with no orthostatic changes.  The patient also continued on Norvasc 10 mg daily.  Noted sacral wound.  The patient was receiving hydrotherapy to site as well as follow up per wound care nurse for sacral decubitus.  He was discharged home on Santyl with moist dressings  daily with a home health nurse arranged.  The patient remained afebrile.  He exhibited no signs of fluid overload during his rehab course.  He did have ceruminosis.  He received Debrox to both the ears with good results.  The patient received weekly collaborative interdisciplinary team conferences to discuss estimated length of stay, family teaching, and any barriers to discharge.  He was continent of bowel and bladder.  He was anuric.  He is overall minimal assist  to supervision for activities of daily living, overall minimal assist to supervision for his mobility.  Full family teaching was completed and plans will be discharge to home on September 26, 2011, with ongoing therapies dictated as per rehab services.  DISCHARGE MEDICATIONS: 1. Norvasc 10 mg daily. 2. Sensipar 60 mg daily. 3. Santyl to sacral wound daily. 4. Aranesp 150 mcg weekly with dialysis. 5. Hectorol 6 mcg Monday, Wednesday, and Friday with dialysis. 6. Neurontin 200 mg at bedtime. 7. Imdur 30 mg daily. 8. Lopressor 100 mg twice daily. 9. Multivitamin at bedtime. 10.Percocet 2 tablets every 6 hours as needed pain. 11.Florastor 250 mg twice daily. 12.Zocor 5 mg at bedtime.  DIET:  Renal diet.  SPECIAL INSTRUCTIONS:  Continue therapies as advised per rehab services. Dialysis as per [facility].  Follow up Dr. Myra Gianotti after recent amputation, monitoring of site.  Santyl and moist dressing to sacral wound daily.  A wound care clinic appointment had been made at [facility] on October 08, 2011, at 1:15 a.m. to address sacral wound.  The patient should follow up with Dr. Olena Leatherwood, medical management.  Dr. Harold Hedge appointment made for October 21, 2011.     Mariam Dollar, P.A.   ______________________________ Ranelle Oyster, M.D.    DA/MEDQ  D:  09/24/2011  T:  09/24/2011  Job:  [id number]  cc:   Lia Hopping, MD V. Charlena Cross, MD Ranelle Oyster, M.D.

## 2011-09-24 NOTE — Progress Notes (Signed)
Agree with SPT.    Obaloluwa Delatte, PT [phone]  

## 2011-09-24 NOTE — Care Management Note (Signed)
Met with pt and his son to report on team conference. Both in agreement with goals and d/c date of 09/26/11.

## 2011-09-24 NOTE — Progress Notes (Signed)
Social Work Patient ID: [name], male   DOB: 02/17/1951, 61 y.o.   MRN: [id number]  Met yesterday and today with patient and family to review team conference and discuss d/c planning issues.  Pt and all family aware and agreeable with targeted d/c date of 7/5 (after HD) and with recommendation of 24/7 minimal assistance overall.  Pt reports plan is to return to his own, handicapped accessible apartment with his son to stay and provide 24/7 care.  Son began family education today with plans to return tomorrow to complete.  Therapies report sessions went very well - no concerns at this point.  Am arranging HH f/u and DME.  [name]

## 2011-09-24 NOTE — Progress Notes (Signed)
Patient ID: [name], male   DOB: 18-Mar-1951, 61 y.o.   MRN: [id number]  Subjective/Complaints: Slept fairly well. Lying on his side today.  No signs of lethargy or confusion Other ros items reviewed and otherwise negative  Objective: Vital Signs: Blood pressure 164/70, pulse 64, temperature 98.4 F (36.9 C), temperature source Oral, resp. rate 19, height 5\' 6"  (1.676 m), weight 61.372 kg (135 lb 4.8 oz), SpO2 98.00%. No results found.  Basename 09/22/11 1500  WBC 8.3  HGB 8.4*  HCT 27.0*  PLT 215    Basename 09/22/11 1500  NA 138  K 3.7  CL 98  CO2 27  GLUCOSE 101*  BUN 24*  CREATININE 5.40*  CALCIUM 9.0   CBG (last 3)  No results found for this basename: GLUCAP:3 in the last 72 hours  Wt Readings from Last 3 Encounters:  09/23/11 61.372 kg (135 lb 4.8 oz)  09/15/11 61.6 kg (135 lb 12.9 oz)  09/15/11 61.6 kg (135 lb 12.9 oz)    Physical Exam:  General appearance: alert, cooperative and no distress Head: Normocephalic, without obvious abnormality, atraumatic Eyes: sclera, conjunctiva clear Ears: normal external- decreased wax Nose: Nares normal. Septum midline. Mucosa normal. No drainage or sinus tenderness. Throat: lips, mucosa, and tongue normal; teeth and gums normal Neck: no adenopathy, no carotid bruit, no JVD, supple, symmetrical, trachea midline and thyroid not enlarged, symmetric, no tenderness/mass/nodules Back: symmetric, no curvature. ROM normal. No CVA tenderness. Resp: clear to auscultation bilaterally Cardio: regular rate and rhythm, S1, S2 normal, no murmur, click, rub or gallop GI: soft, non-tender; bowel sounds normal; no masses,  no organomegaly Extremities: extremities normal, atraumatic, no cyanosis or edema Pulses: 2+ and symmetric Skin: see below Neurologic: Grossly normal- short term memory deficits. Non-focal weakness except for pain inhibition in the left  Incision/Wound: left foot incision with minimal serosanginous drainage.  Wound fairly well approximated. The remainder of the foot is intact. Right 1st toe with a 1 inch area c/w with early breakdown/bloood blister. Stage III with persistent fibronecrotic tissue (40%?) at sacrum.   Assessment/Plan: 1. Functional deficits secondary to left TMA/BPG with deconditioning  which require 3+ hours per day of interdisciplinary therapy in a comprehensive inpatient rehab setting. Physiatrist is providing close team supervision and 24 hour management of active medical problems listed below. Physiatrist and rehab team continue to assess barriers to discharge/monitor patient progress toward functional and medical goals. FIM: FIM - Bathing Bathing Steps Patient Completed: Chest;Right Arm;Left Arm;Abdomen;Front perineal area;Buttocks;Right upper leg;Left upper leg;Right lower leg (including foot) Bathing: 5: Supervision: Safety issues/verbal cues  FIM - Upper Body Dressing/Undressing Upper body dressing/undressing steps patient completed: Thread/unthread right sleeve of pullover shirt/dresss;Thread/unthread left sleeve of pullover shirt/dress;Put head through opening of pull over shirt/dress;Pull shirt over trunk Upper body dressing/undressing: 5: Supervision: Safety issues/verbal cues FIM - Lower Body Dressing/Undressing Lower body dressing/undressing steps patient completed: Thread/unthread right pants leg;Thread/unthread left pants leg;Pull pants up/down;Don/Doff right sock Lower body dressing/undressing: 5: Supervision: Safety issues/verbal cues  FIM - Toileting Toileting: 0: Activity did not occur  FIM - Archivist Transfers: 0-Activity did not occur  FIM - Banker Devices: Bed rails;HOB elevated Bed/Chair Transfer: 5: Supine > Sit: Supervision (verbal cues/safety issues);5: Sit > Supine: Supervision (verbal cues/safety issues);4: Bed > Chair or W/C: Min A (steadying Pt. > 75%)  FIM - Locomotion: Wheelchair Distance:  120' Locomotion: Wheelchair: 2: Travels 50 - 149 ft with supervision, cueing or coaxing FIM - Locomotion: Ambulation Locomotion:  Ambulation Assistive Devices: Walker - Rolling Ambulation/Gait Assistance: 3: Mod assist Locomotion: Ambulation: 1: Travels less than 50 ft with moderate assistance (Pt: 50 - 74%)  Comprehension Comprehension Mode: Auditory Comprehension: 5-Follows basic conversation/direction: With no assist  Expression Expression Mode: Verbal Expression: 5-Expresses basic needs/ideas: With no assist  Social Interaction Social Interaction: 5-Interacts appropriately 90% of the time - Needs monitoring or encouragement for participation or interaction.  Problem Solving Problem Solving: 5-Solves basic 90% of the time/requires cueing < 10% of the time  Memory Memory Assistive Devices: Memory book Memory: 5-Recognizes or recalls 90% of the time/requires cueing < 10% of the time   LOS (Days) 8 A FACE TO FACE EVALUATION WAS PERFORMED 1. Left transmetatarsal amputation/femoropopliteal bypass secondary to peripheral vascular disease  2. DVT Prophylaxis/Anticoagulation: SCDs to right lower extremity  3. Pain Management: Neurontin increase to 200mg , Percocet increase to 10mg .   -percocet 30 min before hydrotherapy 4. Neuropsych: This patient is capable of making decisions on his/her own behalf.  5. End stage renal disease. Continue dialysis as per [facility]. HD at the end of therapy day.  6. Hypertension. Norvasc 10 mg daily, Imdur 30 mg daily, Lopressor 100 mg twice a day. Monitor with increased mobility  7. Hyperlipidemia. Zocor  8. History congestive heart failure. Monitor for any signs of fluid overload  9. Chronic anemia. Aranesp. Followup CBC with dialysis 10. Wound- petroleum dressing, 4x4, kerlix, ACE daily.   -Hydrotherapy to sacral wound.  -pre-treat with percocet was beneficial. 11. Ceruminosis - continue debrox   =ears looking better      [name] 09/24/2011, 7:10 AM

## 2011-09-24 NOTE — Progress Notes (Signed)
HYDROTHERAPY NOTE   09/24/11 1045  Pressure Ulcer 08/27/11 Stage II -  Partial thickness loss of dermis presenting as a shallow open ulcer with a red, pink wound bed without slough.  Date First Assessed/Time First Assessed: 08/27/11 0800   Location: Sacrum  Staging: Stage II -  Partial thickness loss of dermis presenting as a shallow open ulcer with a red, pink wound bed without slough.  State of Healing Early/partial granulation  Site / Wound Assessment Pink;Yellow  % Wound base Red or Granulating 20%  % Wound base Yellow 80%  % Wound base Black 0%  % Wound base Other (Comment) 0%  Peri-wound Assessment Erythema (non-blanchable)  Margins Unattacted edges (unapproximated)  Drainage Amount Minimal  Drainage Description Serous  Dressing Type ABD  Dressing Clean;Dry;Intact  Hydrotherapy  Pulsed lavage therapy - wound location Sacrum  Pulsed Lavage with Suction (psi) 8 psi  Pulsed Lavage with Suction - Normal Saline Used 1000 mL  Pulsed Lavage Tip Tip with splash shield  Selective Debridement  Selective Debridement - Location sacrum  Selective Debridement - Tools Used Forceps;Scissors  Selective Debridement - Tissue Removed yellow slough  Wound Therapy - Assess/Plan/Recommendations  Wound Therapy - Clinical Statement Slow improvement continues.  Wound Therapy - Functional Problem List Decreased mobility  Factors Delaying/Impairing Wound Healing Immobility;Multiple medical problems;Polypharmacy  Hydrotherapy Plan Debridement;Dressing change;Patient/family education;Pulsatile lavage with suction  Wound Therapy - Frequency 6X / week  Wound Therapy - Follow Up Recommendations Home health RN  Wound Therapy Goals - Improve the function of patient's integumentary system by progressing the wound(s) through the phases of wound healing by:  Decrease Necrotic Tissue to 25  Decrease Necrotic Tissue - Progress Progressing toward goal  Increase Granulation Tissue to 75  Increase Granulation Tissue  - Progress Progressing toward goal  Goals/treatment plan/discharge plan were made with and agreed upon by patient/family Yes  Time For Goal Achievement 7 days  Wound Therapy - Potential for Goals Good   09/24/2011  Elgin Bing, PT [phone] (pager)

## 2011-09-24 NOTE — Progress Notes (Signed)
At  approx 0300 NT informed that pt had projectile vomiting pt had green colored vomitus it was on the floor and bedside. While pt was being clean up  NT notice that pt had medication bottle in his pocket. It contained 7 oxycodone 5mg  tablets and 2 unknown pills. Pt stated that he did not know they were there. Medication was counted and sent to pharmacy. Pt received zofran 4mg  po for his vomiting. Will continue to monitor.

## 2011-09-24 NOTE — Progress Notes (Signed)
Physical Therapy Session Note  Patient Details  Name: [name] MRN: [id number] Date of Birth: Jun 24, 1950  Today's Date: 09/24/2011 Time: 1100-1200 Time Calculation (min): 60 min  Short Term Goals: Week 1:  PT Short Term Goal 1 (Week 1): Pt will maintain NWB status of LLE 75% of the time without cueing. PT Short Term Goal 2 (Week 1): Pt will perform supine<>sit transfers with Min A. PT Short Term Goal 3 (Week 1): Pt will perform w/c<>bed transfers with Min A.  Skilled Therapeutic Interventions/Progress Updates:    Pt's son and sisters (who will be assisting with pt's care upon d/c) were present and participative during family education therapy session this am.  Pt reports that he really likes the roho cushion on his w/c.  Ordered roho cushion for pt's w/c at d/c due to sacral wound, necessary pressure relief, and pressure distribution.  Pt self-propelled w/c >150' with frequent rest breaks for improved activity tolerance, endurance, and UE strengthening. Pt is able to self-propel w/c ~40' before requiring rest break due to UE fatigue.  Cued pt frequently for bilateral UE propulsion technique and push to improve efficiency.  Tx session focused on even-level squat-pivot transfers w/c<>mat and stand-pivot transfers with gait belt and MIn A for unlevel transfers or when pt is fatigued following dialysis.  Therapist demonstrated and educated pt's son on safe techniques and son was able to demonstrate competency and understanding with transfer assistance.  Educated pt, son, and sister on w/c mobility/parts/management; roho cushion maintenance, energy conservation, skin prevention and wound management, assistance with transfers and self-care, home modifications, what to do in an emergency, HHPT recommendation, NWB status, and standing for brief stand-pivot transfers only at this time. Pt self-propelled w/c 100' and performed car transfers from sedan height (x1 squat pivot with supervision, x2  stand-pivot with min A).  Pt's son and sister able to demonstrate proper Min A technique with stand-pivot technique. Pt self propelled w/c 100' back to room for improved endurance and left sitting up in w/c on roho cushion for lunch with sister and son present.  Son (primary caregiver) reports that he will be here for family education sessions tomorrow though the sister will not be present.    Therapy Documentation Precautions:  Precautions Precautions: Fall Precaution Comments: sacral area wound, provide pressure relief Restrictions Weight Bearing Restrictions: Yes LLE Weight Bearing: Non weight bearing  Pain: Pt intermittently c/o of pain in sacral wound area- pt premedicated.  See FIM for current functional status  Therapy/Group: Individual Therapy  Deirdre Pippins 09/24/2011, 12:14 PM

## 2011-09-24 NOTE — Progress Notes (Signed)
Occupational Therapy Session Notes  Patient Details  Name: [name] MRN: [id number] Date of Birth: 1950-09-08  Today's Date: 09/24/2011  Short Term Goals: Week 1:  OT Short Term Goal 1 (Week 1): Short Term Goals = Long Term Goals secondary to ELOS  Skilled Therapeutic Interventions/Progress Updates:   Session #1 [id number] - 55 Minutes Individual Therapy No complaints of pain, but discomfort to sacrum/wound area Patient supine in bed upon entering room. Therapist asked patient if he'd rather bathe at sink or bed level, initially patient stated bed level. Therapist then asked patient how he would be bathing at home and patient stated at the sink in the bathroom. Therefore, encouraged patient to perform bathing at sink level, patient agreed too. Engaged in bed mobility for edge of bed -> w/c squat pivot transfer with min assist and verbal cues to adhere to NWB stats -> LLE. Patient then performed ADL retraining at sink level. Patient tended to stand for peri care and to pull pants up to waist, but lost balance during tasks; recommend patient perform lateral leans to complete peri care and pull pants up to waist - patient aware of recommendation. Patient got back to bed to pull pants up to waist after having difficult time in w/c. After ADL patient's sister arrived for family education. Took patient -> ADL apartment and educated patient and patient's sister on toilet transfer & tub/shower transfer onto tub transfer bench (education and demonstration completed, both need reinforcement). Patient stated he had an elevated toilet seat with grab bars so no recommendation for BSC, but therapist does recommend tub transfer bench. See Patient Education tab for more information regarding education during this session.   Session #2 [id number] - 45 Minutes Individual Therapy No complaints of pain, but discomfort to sacrum/would area and complaints of being tired Patient found supine in bed and son  present in room. Engaged in bed mobility and therapist then propelled patient -> ADL apartment for transfers and education -> son. Educated and had patient demonstrate toilet transfer & tub/shower transfer on/off tub transfer bench with son present. Educated son on safety with transfers, w/c management & set-up, and body mechanics during transfers. Also completed some therapeutic exercises -> bilateral UEs using weighted bar (3lb) and dumbbells (2lb) for strengthening -> serratus anterior. Son propelled patient back to room.   Precautions:  Precautions Precautions: Fall Precaution Comments: sacral area wound, provide pressure relief Restrictions Weight Bearing Restrictions: Yes LLE Weight Bearing: Non weight bearing  See FIM for current functional status  Mikhael Hendriks 09/24/2011, 10:49 AM

## 2011-09-25 NOTE — Progress Notes (Signed)
Occupational Therapy Session Notes & Discharge Summary  Patient Details  Name: [name] MRN: [id number] Date of Birth: 1950/09/05  Today's Date: 09/25/2011  SESSION NOTES  Session #1 [phone] - 55 Minutes Individual Therapy No complaints of pain Patient found supine in bed. Engaged in ADL at bed level and recommend patient complete ADL at home at bed level secondary to patient with poor safety awareness; discussed with patient and patient stated he agreed. At this time, patient to perform LB bathing & dressing in seated position performing lateral leans for peri care and to pull pants up to waist secondary to poor standing balance/tolerance/endurance and for safety. After ADL patient transferred from edge of bed -> w/c with close supervision. Patient then propelled self around unit in personal w/c; therapist also assisted with w/c management & w/c set-up for patient. Patient then propelled self to therapy gym for UE exercises on SCIFIT machine. Patient left in room seated in w/c with call bell & phone within reach.   Session #2 1330-1400 - 30 Minutes Individual Therapy Patient with complaints of pain at end of session, no rate given; RN aware Treatment focus on family education regarding ADLs and ADL transfers (tub/shower transfer & toilet transfer) with patient's son. Patient performed transfers with close supervision from son and son gave verbal cues prn to patient for safety with transfers regarding w/c management and set-up of w/c prior and post transfers. Left patient seated in w/c with son.    DISCHARGE SUMMARY  Patient has met 9 of 10 long term goals due to improved activity tolerance, improved balance, postural control, ability to compensate for deficits, improved attention, improved awareness and improved coordination.  Patient to discharge at overall Supervision level.  Patient's care partner is independent to provide the necessary supervision assistance at discharge.     Reasons goals not met: Patient did not meet light housekeeping goal of supervision. Patient showed no interest in performing housekeeping activity, therefore did not engage in such activity. Patient will have family available 24/7 to assist with IADLs prn and HHOT recommended to assess and assist in those areas as necessary.   Recommendation:  Patient will benefit from ongoing skilled OT services in home health setting to continue to advance functional skills in the area of BADL, iADL and Reduce care partner burden.  Equipment: tub transfer bench Patient stated he currently has elevated toilet seat and grab bar to use in bathroom, therefore no BSC ordered at this time.   Reasons for discharge: treatment goals met and discharge from hospital  Patient/family agrees with progress made and goals achieved: Yes  Precautions/Restrictions  Restrictions Weight Bearing Restrictions: Yes LLE Weight Bearing: Non weight bearing  Vital Signs Therapy Vitals Temp: 99.1 F (37.3 C) Temp src: Oral Pulse Rate: 61  Resp: 19  BP: 132/43 mmHg Patient Position, if appropriate: Lying Oxygen Therapy SpO2: 97 % O2 Device: None (Room air)  Pain Pain Assessment Pain Assessment: No/denies pain ("I already took something") Pain Score: 0-No pain Faces Pain Scale: No hurt  ADL - See FIM  Vision/Perception  Vision - History Baseline Vision: Wears glasses only for reading Patient Visual Report: No change from baseline Vision - Assessment Eye Alignment: Within Functional Limits Perception Perception: Within Functional Limits Praxis Praxis: Intact   Cognition Overall Cognitive Status: Appears within functional limits for tasks assessed Arousal/Alertness: Awake/alert Orientation Level: Oriented X4 Memory: Appears intact Awareness: Appears intact Problem Solving: Appears intact Safety/Judgment: Impaired  Sensation Sensation Additional Comments: BUEs appear intact Coordination  Gross Motor  Movements are Fluid and Coordinated: Yes Fine Motor Movements are Fluid and Coordinated: Yes  Mobility - See Discharge navigator  Trunk/Postural Assessment - See Discharge navigator  Balance- See Discharge navigator  Extremity/Trunk Assessment RUE Assessment RUE Assessment: Within Functional Limits RUE AROM (degrees) Overall AROM Right Upper Extremity: Deficits;Due to premorbid status (same as admission) LUE Assessment LUE Assessment: Within Functional Limits (decreased AROM but WFL secondary to arthritis per pt.), patient also with notable winged scapula LUE Strength LUE Overall Strength Comments: same as admission  See FIM for current functional status  [name] 09/25/2011, 9:19 AM

## 2011-09-25 NOTE — Progress Notes (Signed)
Physical Therapy Note  Patient Details  Name: [name] MRN: [id number] Date of Birth: 29-Oct-1950 Today's Date: 09/25/2011  Time: 1330-1424 54 minutes  Pt c/o bottom pain with certain movements, pt medicated prior to session.  Transfer training bed <> chair with focus on w/c set up for transfers with supervision, cuing for each step of w/c set up and managing w/c parts.  Sidelying therex for B LEs for strengthening and endurance.  Seated therex for core strengthening B PNFs, UTR,  Partial crunches.  Toilet transfer with min A.  Mod A for standing for hygiene.  W/c mobility in controlled environment 150' with multiple rest breaks.  Pt required encouragement but participated in full session.   Individual therapy  Jerusalem Wert 09/25/2011, 3:27 PM

## 2011-09-25 NOTE — Progress Notes (Signed)
Patient ID: [name], male   DOB: 04-21-1950, 61 y.o.   MRN: [id number] Patient ID: [name], male   DOB: 01-18-1951, 61 y.o.   MRN: [id number]  Subjective/Complaints: Continued sacral pain No signs of lethargy or confusion Other ros items reviewed and otherwise negative  Objective: Vital Signs: Blood pressure 132/43, pulse 61, temperature 99.1 F (37.3 C), temperature source Oral, resp. rate 19, height 5\' 6"  (1.676 m), weight 60.3 kg (132 lb 15 oz), SpO2 97.00%. No results found.  Basename 09/24/11 1615 09/22/11 1500  WBC 8.9 8.3  HGB 8.3* 8.4*  HCT 26.5* 27.0*  PLT 195 215    Basename 09/24/11 1615 09/22/11 1500  NA 138 138  K 3.5 3.7  CL 96 98  CO2 28 27  GLUCOSE 120* 101*  BUN 29* 24*  CREATININE 5.61* 5.40*  CALCIUM 9.5 9.0   CBG (last 3)  No results found for this basename: GLUCAP:3 in the last 72 hours  Wt Readings from Last 3 Encounters:  09/24/11 60.3 kg (132 lb 15 oz)  09/15/11 61.6 kg (135 lb 12.9 oz)  09/15/11 61.6 kg (135 lb 12.9 oz)    Physical Exam:  General appearance: alert, cooperative and no distress Head: Normocephalic, without obvious abnormality, atraumatic Eyes: sclera, conjunctiva clear Ears: normal external- decreased wax Nose: Nares normal. Septum midline. Mucosa normal. No drainage or sinus tenderness. Throat: lips, mucosa, and tongue normal; teeth and gums normal Neck: no adenopathy, no carotid bruit, no JVD, supple, symmetrical, trachea midline and thyroid not enlarged, symmetric, no tenderness/mass/nodules Back: symmetric, no curvature. ROM normal. No CVA tenderness. Resp: clear to auscultation bilaterally Cardio: regular rate and rhythm, S1, S2 normal, no murmur, click, rub or gallop GI: soft, non-tender; bowel sounds normal; no masses,  no organomegaly Extremities: extremities normal, atraumatic, no cyanosis or edema Pulses: 2+ and symmetric Skin: see below Neurologic: Grossly normal- short term memory deficits.  Non-focal weakness except for pain inhibition in the left  Incision/Wound: left foot incision with minimal serosanginous drainage. Wound fairly well approximated. The remainder of the foot is intact. Right 1st toe with a 1 inch area c/w with early breakdown/bloood blister. 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   Assessment/Plan: 1. Functional deficits secondary to left TMA/BPG with deconditioning  which require 3+ hours per day of interdisciplinary therapy in a comprehensive inpatient rehab setting. Physiatrist is providing close team supervision and 24 hour management of active medical problems listed below. Physiatrist and rehab team continue to assess barriers to discharge/monitor patient progress toward functional and medical goals. FIM: FIM - Bathing Bathing Steps Patient Completed: Chest;Right Arm;Left Arm;Abdomen;Front perineal area;Buttocks;Right upper leg;Left upper leg;Right lower leg (including foot) Bathing: 5: Supervision: Safety issues/verbal cues  FIM - Upper Body Dressing/Undressing Upper body dressing/undressing steps patient completed: Thread/unthread right sleeve of pullover shirt/dresss;Thread/unthread left sleeve of pullover shirt/dress;Put head through opening of pull over shirt/dress;Pull shirt over trunk Upper body dressing/undressing: 5: Supervision: Safety issues/verbal cues FIM - Lower Body Dressing/Undressing Lower body dressing/undressing steps patient completed: Thread/unthread right pants leg;Thread/unthread left pants leg;Pull pants up/down;Don/Doff right sock Lower body dressing/undressing: 5: Supervision: Safety issues/verbal cues (patient tried standing. recommend patient do lateral leans)  FIM - Toileting Toileting: 0: Activity did not occur  FIM - Diplomatic Services operational officer Devices: Elevated toilet seat;Grab bars Toilet Transfers: 4-To toilet/BSC: Min A (steadying Pt. > 75%);4-From toilet/BSC: Min A (steadying  Pt. > 75%)  FIM - Bed/Chair Transfer Bed/Chair Transfer Assistive Devices: Arm rests Bed/Chair  Transfer: 5: Chair or W/C > Bed: Supervision (verbal cues/safety issues);5: Bed > Chair or W/C: Supervision (verbal cues/safety issues);6: Supine > Sit: No assist (squat-pivot transfers supervision, stand-pivot min A)  FIM - Locomotion: Wheelchair Distance: 120' Locomotion: Wheelchair: 5: Travels 150 ft or more: maneuvers on rugs and over door sills with supervision, cueing or coaxing (>150 with multiple rest breaks) FIM - Locomotion: Ambulation Locomotion: Ambulation Assistive Devices: Designer, industrial/product Ambulation/Gait Assistance: 4: Min assist Locomotion: Ambulation: 1: Travels less than 50 ft with minimal assistance (Pt.>75%) (stand-pivot transfers with min A)  Comprehension Comprehension Mode: Auditory Comprehension: 5-Follows basic conversation/direction: With no assist  Expression Expression Mode: Verbal Expression: 5-Expresses basic needs/ideas: With no assist  Social Interaction Social Interaction: 5-Interacts appropriately 90% of the time - Needs monitoring or encouragement for participation or interaction.  Problem Solving Problem Solving: 5-Solves basic 90% of the time/requires cueing < 10% of the time  Memory Memory Assistive Devices: Memory book Memory: 5-Recognizes or recalls 90% of the time/requires cueing < 10% of the time   LOS (Days) 9 A FACE TO FACE EVALUATION WAS PERFORMED 1. Left transmetatarsal amputation/femoropopliteal bypass secondary to peripheral vascular disease  2. DVT Prophylaxis/Anticoagulation: SCDs to right lower extremity  3. Pain Management: Neurontin increased to 200mg , Percocet increased to 10mg .   -percocet 30 min before hydrotherapy 4. Neuropsych: This patient is capable of making decisions on his/her own behalf.  5. End stage renal disease. Continue dialysis as per [facility]. HD at the end of therapy day.  6. Hypertension.  Norvasc 10 mg daily, Imdur 30 mg daily, Lopressor 100 mg twice a day. Monitor with increased mobility  7. Hyperlipidemia. Zocor  8. History congestive heart failure. Monitor for any signs of fluid overload  9. Chronic anemia. Aranesp. Followup CBC with dialysis -stable 10. Wound- petroleum dressing, 4x4, kerlix, ACE daily.   -Hydrotherapy to sacral wound.  -outpt HHRN follow up, family ed. Wound care clinic. 11. Ceruminosis -  =ears looking better     Talin Rozeboom T 09/25/2011, 8:13 AM

## 2011-09-26 ENCOUNTER — Inpatient Hospital Stay (HOSPITAL_COMMUNITY): Payer: Medicare Other

## 2011-09-26 DIAGNOSIS — I739 Peripheral vascular disease, unspecified: Secondary | ICD-10-CM

## 2011-09-26 DIAGNOSIS — N186 End stage renal disease: Secondary | ICD-10-CM

## 2011-09-26 DIAGNOSIS — Z5189 Encounter for other specified aftercare: Secondary | ICD-10-CM

## 2011-09-26 DIAGNOSIS — L98499 Non-pressure chronic ulcer of skin of other sites with unspecified severity: Secondary | ICD-10-CM

## 2011-09-26 DIAGNOSIS — S98919A Complete traumatic amputation of unspecified foot, level unspecified, initial encounter: Secondary | ICD-10-CM

## 2011-09-26 DIAGNOSIS — E1165 Type 2 diabetes mellitus with hyperglycemia: Secondary | ICD-10-CM

## 2011-09-26 LAB — RENAL FUNCTION PANEL
Albumin: 1.7 g/dL — ABNORMAL LOW (ref 3.5–5.2)
BUN: 22 mg/dL (ref 6–23)
Calcium: 9.9 mg/dL (ref 8.4–10.5)
GFR calc Af Amer: 13 mL/min — ABNORMAL LOW (ref >90)
Phosphorus: 4.5 mg/dL (ref 2.3–4.6)
Potassium: 3.7 mEq/L (ref 3.5–5.1)
Sodium: 136 mEq/L (ref 135–145)

## 2011-09-26 LAB — CBC
MCHC: 31.3 g/dL (ref 30.0–36.0)
MCV: 92.8 fL (ref 78.0–100.0)
Platelets: 200 10*3/uL (ref 150–400)
RDW: 20.9 % — ABNORMAL HIGH (ref 11.5–15.5)
WBC: 6.9 10*3/uL (ref 4.0–10.5)

## 2011-09-26 MED ORDER — DARBEPOETIN ALFA-POLYSORBATE 150 MCG/0.3ML IJ SOLN
INTRAMUSCULAR | Status: AC
  Filled 2011-09-26: qty 0.3

## 2011-09-26 MED ORDER — OXYCODONE-ACETAMINOPHEN 5-325 MG PO TABS: 1.0000 | ORAL_TABLET | Freq: Four times a day (QID) | ORAL | Status: AC | PRN

## 2011-09-26 MED ORDER — SACCHAROMYCES BOULARDII 250 MG PO CAPS: 250.0000 mg | ORAL_CAPSULE | Freq: Two times a day (BID) | ORAL | Status: AC

## 2011-09-26 MED ORDER — GABAPENTIN 100 MG PO CAPS: 200.0000 mg | ORAL_CAPSULE | Freq: Every day | ORAL | Status: DC

## 2011-09-26 MED ORDER — COLLAGENASE 250 UNIT/GM EX OINT: TOPICAL_OINTMENT | Freq: Every day | CUTANEOUS | Status: AC

## 2011-09-26 MED ORDER — HEPARIN SODIUM (PORCINE) 1000 UNIT/ML DIALYSIS
20.0000 [IU]/kg | INTRAMUSCULAR | Status: DC | PRN
  Administered 2011-09-26: 1200 [IU] via INTRAVENOUS_CENTRAL
  Filled 2011-09-26: qty 2

## 2011-09-26 NOTE — Progress Notes (Signed)
Orthopedic Tech Progress Note Patient Details:  [facility]on [facility] Aug 29, 1950 [id number]  Ortho Devices Type of Ortho Device: Postop boot Ortho Device/Splint Location: darco shoe Ortho Device/Splint Interventions: Application   Cammer, Mickie Bail 09/26/2011, 9:42 AM

## 2011-09-26 NOTE — Progress Notes (Signed)
Social Work  Discharge Note  The overall goal for the admission was met for:   Discharge location: Yes - returning to his accessible apartment with son to stay and provide 24/7 assistance  Length of Stay: Yes - 10 days  Discharge activity level: Yes - minimal assistance overall  Home/community participation: Yes  Services provided included: MD, RD, PT, OT, RN, CM, TR, Pharmacy and SW  Financial Services: Medicare and Medicaid  Follow-up services arranged: Home Health: RN, PT, OT via [facility], DME: [phone] [place] w/c w/ELR, seatbelt and Roho cushion; rw, tub bench via [facility] and Patient/Family has no preference for HH/DME agencies  Comments (or additional information):  Patient/Family verbalized understanding of follow-up arrangements: Yes  Individual responsible for coordination of the follow-up plan: patient  Confirmed correct DME delivered: Suhani Stillion 09/26/2011    Beckhem Isadore

## 2011-09-26 NOTE — Plan of Care (Signed)
Problem: RH Wheelchair Mobility Goal: LTG Patient will propel w/c in controlled environment (PT) LTG: Patient will propel wheelchair in controlled environment, # of feet with assist (PT)  Outcome: Not Met (add Reason) Pt required supervision/cueing/coaxing for technique

## 2011-09-26 NOTE — Procedures (Signed)
On hemodialysis and resting.  Hemodynamically stable.  Will be suitable for discharge post treatment. Monchel Pollitt C

## 2011-09-26 NOTE — Progress Notes (Signed)
Subjective/Complaints: Continued sacral pain No signs of lethargy or confusion Other ros items reviewed and otherwise negative  Objective: Vital Signs: Blood pressure 170/80, pulse 62, temperature 98.3 F (36.8 C), temperature source Oral, resp. rate 18, height 5\' 6"  (1.676 m), weight 60.3 kg (132 lb 15 oz), SpO2 97.00%. No results found.  Basename 09/24/11 1615  WBC 8.9  HGB 8.3*  HCT 26.5*  PLT 195    Basename 09/24/11 1615  NA 138  K 3.5  CL 96  CO2 28  GLUCOSE 120*  BUN 29*  CREATININE 5.61*  CALCIUM 9.5   CBG (last 3)  No results found for this basename: GLUCAP:3 in the last 72 hours  Wt Readings from Last 3 Encounters:  09/24/11 60.3 kg (132 lb 15 oz)  09/15/11 61.6 kg (135 lb 12.9 oz)  09/15/11 61.6 kg (135 lb 12.9 oz)    Physical Exam:  General appearance: alert, cooperative and no distress Head: Normocephalic, without obvious abnormality, atraumatic Eyes: sclera, conjunctiva clear Ears: normal external- decreased wax Nose: Nares normal. Septum midline. Mucosa normal. No drainage or sinus tenderness. Throat: lips, mucosa, and tongue normal; teeth and gums normal Neck: no adenopathy, no carotid bruit, no JVD, supple, symmetrical, trachea midline and thyroid not enlarged, symmetric, no tenderness/mass/nodules Back: symmetric, no curvature. ROM normal. No CVA tenderness. Resp: clear to auscultation bilaterally Cardio: regular rate and rhythm, S1, S2 normal, no murmur, click, rub or gallop GI: soft, non-tender; bowel sounds normal; no masses,  no organomegaly Extremities: extremities normal, atraumatic, no cyanosis or edema Pulses: 2+ and symmetric Skin: see below Neurologic: Grossly normal- short term memory deficits. Non-focal weakness except for pain inhibition in the left  Incision/Wound: left foot incision with minimal serosanginous drainage. Wound fairly well approximated. The remainder of the foot is intact. Right 1st toe with a 1 inch area c/w with  early breakdown/bloood blister. 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   Assessment/Plan: 1. Functional deficits secondary to left TMA/BPG with deconditioning  which require 3+ hours per day of interdisciplinary therapy in a comprehensive inpatient rehab setting. Physiatrist is providing close team supervision and 24 hour management of active medical problems listed below. Physiatrist and rehab team continue to assess barriers to discharge/monitor patient progress toward functional and medical goals.  For dc today.  HH follow up.   FIM: FIM - Bathing Bathing Steps Patient Completed: Chest;Right Arm;Left Arm;Abdomen;Front perineal area;Buttocks;Right upper leg;Left upper leg;Right lower leg (including foot) Bathing: 5: Supervision: Safety issues/verbal cues  FIM - Upper Body Dressing/Undressing Upper body dressing/undressing steps patient completed: Thread/unthread right sleeve of pullover shirt/dresss;Thread/unthread left sleeve of pullover shirt/dress;Put head through opening of pull over shirt/dress;Pull shirt over trunk Upper body dressing/undressing: 5: Supervision: Safety issues/verbal cues FIM - Lower Body Dressing/Undressing Lower body dressing/undressing steps patient completed: Thread/unthread right pants leg;Thread/unthread left pants leg;Pull pants up/down;Don/Doff right sock Lower body dressing/undressing: 5: Supervision: Safety issues/verbal cues  FIM - Toileting Toileting steps completed by patient: Adjust clothing prior to toileting;Performs perineal hygiene;Adjust clothing after toileting Toileting: 5: Supervision: Safety issues/verbal cues  FIM - Archivist Transfers Assistive Devices: Elevated toilet seat;Grab bars Toilet Transfers: 5-To toilet/BSC: Supervision (verbal cues/safety issues);5-From toilet/BSC: Supervision (verbal cues/safety issues)  FIM - Press photographer Assistive Devices: Arm  rests Bed/Chair Transfer: 5: Chair or W/C > Bed: Supervision (verbal cues/safety issues);5: Bed > Chair or W/C: Supervision (verbal cues/safety issues)  FIM - Locomotion: Wheelchair Distance: 120' Locomotion: Wheelchair: 5: Travels 150 ft or more: maneuvers  on rugs and over door sills with supervision, cueing or coaxing FIM - Locomotion: Ambulation Locomotion: Ambulation Assistive Devices: Walker - Rolling Ambulation/Gait Assistance: 4: Min assist Locomotion: Ambulation: 0: Activity did not occur  Comprehension Comprehension Mode: Auditory Comprehension: 5-Follows basic conversation/direction: With no assist  Expression Expression Mode: Verbal Expression: 5-Expresses basic needs/ideas: With no assist  Social Interaction Social Interaction: 5-Interacts appropriately 90% of the time - Needs monitoring or encouragement for participation or interaction.  Problem Solving Problem Solving: 5-Solves basic 90% of the time/requires cueing < 10% of the time  Memory Memory Assistive Devices: Memory book Memory: 5-Recognizes or recalls 90% of the time/requires cueing < 10% of the time   LOS (Days) 10 A FACE TO FACE EVALUATION WAS PERFORMED 1. Left transmetatarsal amputation/femoropopliteal bypass secondary to peripheral vascular disease   -surgery follow up of foot. 2. DVT Prophylaxis/Anticoagulation: SCDs to right lower extremity  3. Pain Management: Neurontin increased to 200mg , Percocet increased to 10mg .   -percocet prn. 4. Neuropsych: This patient is capable of making decisions on his/her own behalf.  5. End stage renal disease. Continue dialysis as per [facility]. HD at the end of therapy day.  6. Hypertension. Norvasc 10 mg daily, Imdur 30 mg daily, Lopressor 100 mg twice a day. Monitor with increased mobility  7. Hyperlipidemia. Zocor  8. History congestive heart failure. Monitor for any signs of fluid overload  9. Chronic anemia. Aranesp. Followup CBC with  dialysis -stable 10. Wound- petroleum dressing, 4x4, kerlix, ACE daily.   -Hydrotherapy to sacral wound.  -outpt HHRN follow up, family ed. Wound care clinic. 11. Ceruminosis -  =ears looking better     Christelle Igoe T 09/26/2011, 8:28 AM

## 2011-09-26 NOTE — Progress Notes (Signed)
Hydrotherapy Note  09/26/11 1115  Pressure Ulcer 08/27/11 Stage II -  Partial thickness loss of dermis presenting as a shallow open ulcer with a red, pink wound bed without slough.  Date First Assessed/Time First Assessed: 08/27/11 0800   Location: Sacrum  Staging: Stage II -  Partial thickness loss of dermis presenting as a shallow open ulcer with a red, pink wound bed without slough.  State of Healing Early/partial granulation  Site / Wound Assessment Pink;Yellow  % Wound base Red or Granulating 20%  % Wound base Yellow 80%  % Wound base Black 0%  % Wound base Other (Comment) 0%  Peri-wound Assessment Erythema (non-blanchable)  Margins Unattacted edges (unapproximated)  Drainage Amount Minimal  Drainage Description Serous  Treatment Cleansed;Debridement (Selective);Hydrotherapy (Pulse lavage);Packing (Saline gauze) (Santyl)  Dressing Type Gauze (Comment)  Dressing Clean;Dry;Intact  Hydrotherapy  Pulsed lavage therapy - wound location Sacrum  Pulsed Lavage with Suction (psi) 8 psi  Pulsed Lavage with Suction - Normal Saline Used 1000 mL  Pulsed Lavage Tip Tip with splash shield  Selective Debridement  Selective Debridement - Location sacrum  Selective Debridement - Tools Used Other (comment)  Selective Debridement - Tissue Removed yellow slough  Wound Therapy - Assess/Plan/Recommendations  Wound Therapy - Clinical Statement Slow improvement continues.  Yellow eschar film remains tenacious and will continue to benefit most from the Santyl.  Wound Therapy - Functional Problem List Decreased mobility  Factors Delaying/Impairing Wound Healing Immobility;Multiple medical problems;Polypharmacy  Hydrotherapy Plan Debridement;Dressing change;Patient/family education;Pulsatile lavage with suction  Wound Therapy - Frequency 6X / week  Wound Therapy - Follow Up Recommendations Home health RN  Wound Therapy Goals - Improve the function of patient's integumentary system by progressing the  wound(s) through the phases of wound healing by:  Decrease Necrotic Tissue - Progress Progressing toward goal  Increase Granulation Tissue - Progress Progressing toward goal  Goals/treatment plan/discharge plan were made with and agreed upon by patient/family Yes  Wound Therapy - Potential for Goals Good   09/26/2011  Aynor Bing, PT [phone] (pager)

## 2011-09-26 NOTE — Progress Notes (Signed)
Physical Therapy Discharge Summary  Patient Details  Name: [name] MRN: [id number] Date of Birth: 09-04-1950  Today's Date: 09/26/2011 Time: 1115-1205 Time Calculation (min): 50 min  Patient has met 6 of 7 long term goals due to improved activity tolerance, improved balance, improved postural control, increased strength, decreased pain, ability to compensate for deficits and functional use of  left lower extremity.  Patient to discharge at a wheelchair level with overall Supervision, moderate assistance needed for un-level transfer or when patient is fatigued from dialysis.   Patient's son and sister are independent and competent to provide the necessary physical assistance at discharge.  Reasons goals not met: Pt continues to require supervision with w/c mobility for w/c parts management, safety with mobility, and cueing for proper technique and propulsion with bilateral UEs.    Recommendation:  Patient will benefit from ongoing skilled PT services in home health setting to continue to advance safe functional mobility, address ongoing impairments in decreased endurance, muscle strengthening, activity tolerance, community reintegration, safety awareness, decreased balance, skin protection/management, balance, and minimize fall risk.  Equipment: 16x18  ultralight frame breezy w/c with ELR, roho cushion, and jay-go back.  Pt also given RW but pt and family formally educated to not use RW until HHPT gives permission  Reasons for discharge: discharge from hospital  Patient/family agrees with progress made and goals achieved: Yes  PT Discharge Precautions/Restrictions Precautions Precautions: Fall Precaution Comments: Sacral wound, sore on R foot Restrictions Weight Bearing Restrictions: Yes LLE Weight Bearing: Non weight bearing Pain Pain Assessment Pain Assessment: 0-10 Pain Score: 10-Worst pain ever Pain Type: Acute pain Pain Location: Foot and sacral wound area Pain  Orientation: Left Pain Descriptors: Aching Pain Onset: Gradual Pain Intervention(s): Medication (See eMAR);RN made aware 2nd Pain Site Pain Score: 10 Pain Location: Sacrum Vision/Perception  Vision - History Patient Visual Report: No change from baseline Vision - Assessment Eye Alignment: Within Functional Limits Perception Perception: Within Functional Limits Praxis Praxis: Intact  Cognition Overall Cognitive Status: Appears within functional limits for tasks assessed Arousal/Alertness: Awake/alert Orientation Level: Oriented X4 Memory: Impaired Memory Impairment: Decreased short term memory Awareness: Impaired Awareness Impairment: Anticipatory impairment Problem Solving: Appears intact Behaviors: Impulsive Safety/Judgment: Impaired Comments: pt requires frequent cues to maintain NWB status as he wants to rest it on RLE or floor Sensation Sensation Light Touch: Impaired by gross assessment (diminished LT of L foot (general)) Proprioception: Impaired by gross assessment (decreased awareness of LLE placement and WB) Additional Comments: BUEs appear intact Coordination Gross Motor Movements are Fluid and Coordinated: Yes Fine Motor Movements are Fluid and Coordinated: Yes Motor  Motor Motor: Abnormal postural alignment and control  Locomotion  Ambulation Ambulation: No Ambulation/Gait Assistance: Not tested (comment) Gait Gait: No Stairs / Additional Locomotion Stairs: No Curb: 1: +1 Total assist Wheelchair Mobility Wheelchair Mobility: Yes Wheelchair Assistance: 5: Financial planner Details: Verbal cues for precautions/safety;Verbal cues for Diplomatic Services operational officer: Both upper extremities Wheelchair Parts Management: Supervision/cueing Distance: >150 (with multiple rest breaks due to decreased endurance)  Trunk/Postural Assessment  Cervical Assessment Cervical Assessment: Within Functional Limits Thoracic Assessment Thoracic  Assessment: Within Functional Limits Lumbar Assessment Lumbar Assessment: Within Functional Limits Postural Control Postural Control: Deficits on evaluation  Balance Balance Balance Assessed: Yes Static Sitting Balance Static Sitting - Balance Support: No upper extremity supported;Feet supported Static Sitting - Level of Assistance: 6: Modified independent (Device/Increase time) Dynamic Sitting Balance Dynamic Sitting - Balance Support: Feet supported;No upper extremity supported Dynamic Sitting - Level of Assistance: 6:  Modified independent (Device/Increase time) Static Standing Balance Static Standing - Balance Support: Bilateral upper extremity supported Static Standing - Level of Assistance: 4: Min assist Dynamic Standing Balance Dynamic Standing - Level of Assistance: 3: Mod assist Extremity Assessment  RUE Assessment RUE Assessment: Exceptions to [facility] RUE AROM (degrees) Overall AROM Right Upper Extremity: Deficits;Due to premorbid status LUE Assessment LUE Assessment: Exceptions to [facility] LUE Strength LUE Overall Strength Comments: decreased grip strength; same as admission RLE Assessment RLE Assessment: Exceptions to [facility] (wound on bottom of R big toe) RLE Strength RLE Overall Strength Comments: Hip flex 4-/5, grossly 4 to 4+/5 throughout LLE Assessment LLE Assessment: Exceptions to [facility] (s/p transmet amputation, ROM WFL) LLE Strength LLE Overall Strength Comments: 4-/5 hip flex; 4/5 knee ext, knee flex, 3/5 PF/DF  See FIM for current functional status  Skilled Therapy Session: Pt's sister present for family education.  Tx focused on bed mobility, w/c propulsion on carpet in home environment as well as controlled environment, squat pivot wc<>mat level transfers, stand-pivot wc<>mat transfers to unlevel surfaces with sister providing assistance, and family education. Pt's personal w/c and roho cushion adjusted for patient to provide optimal sitting posture and pressure relief,  and skin protection.  Re-educated pt and sister on cushion maintenance and set up.  Sister demonstrated safe technique with transfers and appropriate cueing for w/c management.  Therapist demonstrated bumping up w/c onto curb step and educated on community reintegration and energy conservation.  Sister demonstrated safe and proper technique with curb step.  Pt self-propelled w/c >150' with multiple rest breaks throughout session for improved activity tolerance, UE strengthening, and endurance.  Pt and sister report no further questions.    Deirdre Pippins 09/26/2011, 12:37 PM   y

## 2011-09-27 NOTE — Progress Notes (Signed)
Remember to make a discharge note in the progress section that reflects that the patient was discharged to home accompanied by sister and son. "Discharge instructions were reviewed with the patient by Delle Reining PA and patient stated an understanding of the information given and stated no further question".

## 2011-10-07 ENCOUNTER — Other Ambulatory Visit: Payer: Self-pay

## 2011-10-07 ENCOUNTER — Encounter (HOSPITAL_BASED_OUTPATIENT_CLINIC_OR_DEPARTMENT_OTHER): Payer: Medicare Other | Attending: General Surgery

## 2011-10-07 DIAGNOSIS — Z992 Dependence on renal dialysis: Secondary | ICD-10-CM | POA: Insufficient documentation

## 2011-10-07 DIAGNOSIS — Z01810 Encounter for preprocedural cardiovascular examination: Secondary | ICD-10-CM

## 2011-10-07 DIAGNOSIS — N189 Chronic kidney disease, unspecified: Secondary | ICD-10-CM | POA: Insufficient documentation

## 2011-10-07 DIAGNOSIS — N186 End stage renal disease: Secondary | ICD-10-CM

## 2011-10-07 DIAGNOSIS — L8990 Pressure ulcer of unspecified site, unspecified stage: Secondary | ICD-10-CM | POA: Insufficient documentation

## 2011-10-07 DIAGNOSIS — Z79899 Other long term (current) drug therapy: Secondary | ICD-10-CM | POA: Insufficient documentation

## 2011-10-07 DIAGNOSIS — S98919A Complete traumatic amputation of unspecified foot, level unspecified, initial encounter: Secondary | ICD-10-CM | POA: Insufficient documentation

## 2011-10-07 DIAGNOSIS — E109 Type 1 diabetes mellitus without complications: Secondary | ICD-10-CM | POA: Insufficient documentation

## 2011-10-07 DIAGNOSIS — L89109 Pressure ulcer of unspecified part of back, unspecified stage: Secondary | ICD-10-CM | POA: Insufficient documentation

## 2011-10-07 DIAGNOSIS — I129 Hypertensive chronic kidney disease with stage 1 through stage 4 chronic kidney disease, or unspecified chronic kidney disease: Secondary | ICD-10-CM | POA: Insufficient documentation

## 2011-10-08 NOTE — Progress Notes (Signed)
[facility]  NAME:  [name], [name] NO.:  [id number]  MEDICAL RECORD NO.:  [id number]      DATE OF BIRTH:  [birth year]  PHYSICIAN:  Ardath Sax, M.D.           VISIT DATE:                                  OFFICE VISIT   This is his 1st visit to the [facility] and he comes here as a 61 year old gentleman who has many health problems including type 1 diabetes, hypertension, renal failure, and he is on dialysis 3 times a week.  He had a recent femoral-popliteal bypass graft on the left side followed by an ischemic left foot and then he underwent a transmetatarsal on the left foot.  During all this time he is continued on his dialysis 3 times a week.  He was sent here because sometime during his hospitalization, he developed a sacral decubitus.  We saw him here and found the wound to be clean about 3 cm in width and 5 in length and a couple of centimeters deep.  It appeared clean right over the sacrum and we treated it with silver alginate and we ordered him to have air mattress and we also ordered a wound VAC.  This man is on many medicines including Norvasc, Neurontin, Imdur, Lopressor, Percocet, Florastor and Zocor.  He is also on a renal diet.  Dr. Myra Gianotti is following his transmetatarsal amputation which has a little disruption of the suture line, but looks like it may heal satisfactorily.  We will get all these things ordered and see him next week and hopefully in that length of time he will get an air mattress and a wound VAC.     Ardath Sax, M.D.     PP/MEDQ  D:  10/08/2011  T:  10/08/2011  Job:  [id number]

## 2011-10-11 ENCOUNTER — Inpatient Hospital Stay (HOSPITAL_COMMUNITY)
Admission: EM | Admit: 2011-10-11 | Discharge: 2011-10-14 | DRG: 177 | Disposition: A | Discharge status: Home Health | Payer: Medicare Other | Attending: Family Medicine | Admitting: Family Medicine

## 2011-10-11 ENCOUNTER — Emergency Department (HOSPITAL_COMMUNITY): Payer: Medicare Other

## 2011-10-11 ENCOUNTER — Encounter (HOSPITAL_COMMUNITY): Payer: Self-pay | Admitting: *Deleted

## 2011-10-11 DIAGNOSIS — M129 Arthropathy, unspecified: Secondary | ICD-10-CM | POA: Diagnosis present

## 2011-10-11 DIAGNOSIS — K56609 Unspecified intestinal obstruction, unspecified as to partial versus complete obstruction: Secondary | ICD-10-CM

## 2011-10-11 DIAGNOSIS — J9601 Acute respiratory failure with hypoxia: Secondary | ICD-10-CM

## 2011-10-11 DIAGNOSIS — I6529 Occlusion and stenosis of unspecified carotid artery: Secondary | ICD-10-CM

## 2011-10-11 DIAGNOSIS — Z79899 Other long term (current) drug therapy: Secondary | ICD-10-CM

## 2011-10-11 DIAGNOSIS — G4700 Insomnia, unspecified: Secondary | ICD-10-CM

## 2011-10-11 DIAGNOSIS — I5032 Chronic diastolic (congestive) heart failure: Secondary | ICD-10-CM

## 2011-10-11 DIAGNOSIS — N186 End stage renal disease: Secondary | ICD-10-CM

## 2011-10-11 DIAGNOSIS — A0472 Enterocolitis due to Clostridium difficile, not specified as recurrent: Secondary | ICD-10-CM

## 2011-10-11 DIAGNOSIS — J189 Pneumonia, unspecified organism: Secondary | ICD-10-CM

## 2011-10-11 DIAGNOSIS — D649 Anemia, unspecified: Secondary | ICD-10-CM

## 2011-10-11 DIAGNOSIS — D631 Anemia in chronic kidney disease: Secondary | ICD-10-CM

## 2011-10-11 DIAGNOSIS — L89109 Pressure ulcer of unspecified part of back, unspecified stage: Secondary | ICD-10-CM | POA: Diagnosis present

## 2011-10-11 DIAGNOSIS — E785 Hyperlipidemia, unspecified: Secondary | ICD-10-CM

## 2011-10-11 DIAGNOSIS — J90 Pleural effusion, not elsewhere classified: Secondary | ICD-10-CM

## 2011-10-11 DIAGNOSIS — K5900 Constipation, unspecified: Secondary | ICD-10-CM

## 2011-10-11 DIAGNOSIS — R0989 Other specified symptoms and signs involving the circulatory and respiratory systems: Secondary | ICD-10-CM

## 2011-10-11 DIAGNOSIS — J690 Pneumonitis due to inhalation of food and vomit: Principal | ICD-10-CM

## 2011-10-11 DIAGNOSIS — N039 Chronic nephritic syndrome with unspecified morphologic changes: Secondary | ICD-10-CM

## 2011-10-11 DIAGNOSIS — I4892 Unspecified atrial flutter: Secondary | ICD-10-CM

## 2011-10-11 DIAGNOSIS — I6350 Cerebral infarction due to unspecified occlusion or stenosis of unspecified cerebral artery: Secondary | ICD-10-CM

## 2011-10-11 DIAGNOSIS — G9341 Metabolic encephalopathy: Secondary | ICD-10-CM

## 2011-10-11 DIAGNOSIS — S88919A Complete traumatic amputation of unspecified lower leg, level unspecified, initial encounter: Secondary | ICD-10-CM

## 2011-10-11 DIAGNOSIS — Z87891 Personal history of nicotine dependence: Secondary | ICD-10-CM

## 2011-10-11 DIAGNOSIS — I509 Heart failure, unspecified: Secondary | ICD-10-CM

## 2011-10-11 DIAGNOSIS — I70219 Atherosclerosis of native arteries of extremities with intermittent claudication, unspecified extremity: Secondary | ICD-10-CM

## 2011-10-11 DIAGNOSIS — L89159 Pressure ulcer of sacral region, unspecified stage: Secondary | ICD-10-CM

## 2011-10-11 DIAGNOSIS — R112 Nausea with vomiting, unspecified: Secondary | ICD-10-CM | POA: Diagnosis present

## 2011-10-11 DIAGNOSIS — I4891 Unspecified atrial fibrillation: Secondary | ICD-10-CM

## 2011-10-11 DIAGNOSIS — I998 Other disorder of circulatory system: Secondary | ICD-10-CM

## 2011-10-11 DIAGNOSIS — S98919A Complete traumatic amputation of unspecified foot, level unspecified, initial encounter: Secondary | ICD-10-CM

## 2011-10-11 DIAGNOSIS — K922 Gastrointestinal hemorrhage, unspecified: Secondary | ICD-10-CM

## 2011-10-11 DIAGNOSIS — I739 Peripheral vascular disease, unspecified: Secondary | ICD-10-CM | POA: Diagnosis present

## 2011-10-11 DIAGNOSIS — I10 Essential (primary) hypertension: Secondary | ICD-10-CM

## 2011-10-11 DIAGNOSIS — Y95 Nosocomial condition: Secondary | ICD-10-CM

## 2011-10-11 DIAGNOSIS — Z992 Dependence on renal dialysis: Secondary | ICD-10-CM

## 2011-10-11 DIAGNOSIS — R0609 Other forms of dyspnea: Secondary | ICD-10-CM

## 2011-10-11 DIAGNOSIS — L8994 Pressure ulcer of unspecified site, stage 4: Secondary | ICD-10-CM | POA: Diagnosis present

## 2011-10-11 DIAGNOSIS — Z8673 Personal history of transient ischemic attack (TIA), and cerebral infarction without residual deficits: Secondary | ICD-10-CM

## 2011-10-11 DIAGNOSIS — I120 Hypertensive chronic kidney disease with stage 5 chronic kidney disease or end stage renal disease: Secondary | ICD-10-CM | POA: Diagnosis present

## 2011-10-11 DIAGNOSIS — R6521 Severe sepsis with septic shock: Secondary | ICD-10-CM

## 2011-10-11 DIAGNOSIS — N189 Chronic kidney disease, unspecified: Secondary | ICD-10-CM

## 2011-10-11 DIAGNOSIS — I2510 Atherosclerotic heart disease of native coronary artery without angina pectoris: Secondary | ICD-10-CM

## 2011-10-11 LAB — COMPREHENSIVE METABOLIC PANEL
ALT: 6 U/L (ref 0–53)
CO2: 31 mEq/L (ref 19–32)
Calcium: 9.2 mg/dL (ref 8.4–10.5)
Chloride: 99 mEq/L (ref 96–112)
Creatinine, Ser: 5.1 mg/dL — ABNORMAL HIGH (ref 0.50–1.35)
GFR calc Af Amer: 13 mL/min — ABNORMAL LOW (ref >90)
GFR calc non Af Amer: 11 mL/min — ABNORMAL LOW (ref >90)
Glucose, Bld: 97 mg/dL (ref 70–99)
Total Bilirubin: 0.3 mg/dL (ref 0.3–1.2)

## 2011-10-11 LAB — CBC
HCT: 29.7 % — ABNORMAL LOW (ref 39.0–52.0)
Hemoglobin: 9 g/dL — ABNORMAL LOW (ref 13.0–17.0)
MCH: 29.9 pg (ref 26.0–34.0)
MCHC: 30.3 g/dL (ref 30.0–36.0)
MCV: 98.7 fL (ref 78.0–100.0)
RBC: 3.01 MIL/uL — ABNORMAL LOW (ref 4.22–5.81)

## 2011-10-11 LAB — CBC WITH DIFFERENTIAL/PLATELET
Basophils Relative: 0 % (ref 0–1)
Eosinophils Absolute: 0.2 10*3/uL (ref 0.0–0.7)
Eosinophils Relative: 3 % (ref 0–5)
HCT: 28.5 % — ABNORMAL LOW (ref 39.0–52.0)
Hemoglobin: 8.8 g/dL — ABNORMAL LOW (ref 13.0–17.0)
MCH: 30.2 pg (ref 26.0–34.0)
MCHC: 30.9 g/dL (ref 30.0–36.0)
MCV: 97.9 fL (ref 78.0–100.0)
Monocytes Absolute: 0.3 10*3/uL (ref 0.1–1.0)
Neutro Abs: 4.7 10*3/uL (ref 1.7–7.7)

## 2011-10-11 LAB — LACTIC ACID, PLASMA: Lactic Acid, Venous: 0.7 mmol/L (ref 0.5–2.2)

## 2011-10-11 LAB — PROCALCITONIN: Procalcitonin: 3.82 ng/mL

## 2011-10-11 MED ORDER — LEVOFLOXACIN IN D5W 750 MG/150ML IV SOLN
750.0000 mg | INTRAVENOUS | Status: DC
  Administered 2011-10-11: 750 mg via INTRAVENOUS
  Filled 2011-10-11: qty 150

## 2011-10-11 MED ORDER — LANTHANUM CARBONATE 500 MG PO CHEW
1000.0000 mg | CHEWABLE_TABLET | Freq: Two times a day (BID) | ORAL | Status: DC
  Administered 2011-10-11: 1000 mg via ORAL
  Filled 2011-10-11 (×4): qty 2

## 2011-10-11 MED ORDER — DEXTROSE 5 % IV SOLN
INTRAVENOUS | Status: AC
  Filled 2011-10-11: qty 2

## 2011-10-11 MED ORDER — RENA-VITE PO TABS
1.0000 | ORAL_TABLET | Freq: Every day | ORAL | Status: DC
  Administered 2011-10-11 – 2011-10-14 (×4): 1 via ORAL
  Filled 2011-10-11 (×5): qty 1

## 2011-10-11 MED ORDER — PANTOPRAZOLE SODIUM 40 MG PO TBEC
40.0000 mg | DELAYED_RELEASE_TABLET | Freq: Every day | ORAL | Status: DC
  Administered 2011-10-11 – 2011-10-14 (×3): 40 mg via ORAL
  Filled 2011-10-11 (×3): qty 1

## 2011-10-11 MED ORDER — SIMVASTATIN 5 MG PO TABS
5.0000 mg | ORAL_TABLET | Freq: Every day | ORAL | Status: DC
  Administered 2011-10-11 – 2011-10-13 (×3): 5 mg via ORAL
  Filled 2011-10-11 (×4): qty 1

## 2011-10-11 MED ORDER — GABAPENTIN 100 MG PO CAPS
200.0000 mg | ORAL_CAPSULE | Freq: Every day | ORAL | Status: DC
  Administered 2011-10-11 – 2011-10-14 (×4): 200 mg via ORAL
  Filled 2011-10-11 (×5): qty 2

## 2011-10-11 MED ORDER — WHITE PETROLATUM GEL
Status: AC
  Administered 2011-10-11: 17:00:00
  Filled 2011-10-11: qty 5

## 2011-10-11 MED ORDER — BIOTENE DRY MOUTH MT LIQD: 15.0000 mL | Freq: Two times a day (BID) | OROMUCOSAL | Status: DC

## 2011-10-11 MED ORDER — SACCHAROMYCES BOULARDII 250 MG PO CAPS
250.0000 mg | ORAL_CAPSULE | Freq: Two times a day (BID) | ORAL | Status: DC
  Administered 2011-10-11 – 2011-10-14 (×5): 250 mg via ORAL
  Filled 2011-10-11 (×8): qty 1

## 2011-10-11 MED ORDER — VANCOMYCIN HCL IN DEXTROSE 1-5 GM/200ML-% IV SOLN
1000.0000 mg | INTRAVENOUS | Status: DC
  Administered 2011-10-11: 1000 mg via INTRAVENOUS
  Filled 2011-10-11: qty 200

## 2011-10-11 MED ORDER — HEPARIN SODIUM (PORCINE) 5000 UNIT/ML IJ SOLN
5000.0000 [IU] | Freq: Three times a day (TID) | INTRAMUSCULAR | Status: DC
  Administered 2011-10-11 – 2011-10-14 (×8): 5000 [IU] via SUBCUTANEOUS
  Filled 2011-10-11 (×11): qty 1

## 2011-10-11 MED ORDER — DEXTROSE 5 % IV SOLN
1.0000 g | INTRAVENOUS | Status: DC
  Administered 2011-10-11: 1 g via INTRAVENOUS
  Filled 2011-10-11 (×2): qty 1

## 2011-10-11 MED ORDER — CINACALCET HCL 30 MG PO TABS
60.0000 mg | ORAL_TABLET | Freq: Every day | ORAL | Status: DC
  Filled 2011-10-11 (×2): qty 2

## 2011-10-11 MED ORDER — ONDANSETRON HCL 4 MG/2ML IJ SOLN: 4.0000 mg | Freq: Four times a day (QID) | INTRAMUSCULAR | Status: DC | PRN

## 2011-10-11 MED ORDER — CHLORHEXIDINE GLUCONATE CLOTH 2 % EX PADS
6.0000 | MEDICATED_PAD | Freq: Every day | CUTANEOUS | Status: DC
  Administered 2011-10-12 – 2011-10-14 (×3): 6 via TOPICAL

## 2011-10-11 MED ORDER — ONDANSETRON HCL 4 MG PO TABS: 4.0000 mg | ORAL_TABLET | Freq: Four times a day (QID) | ORAL | Status: DC | PRN

## 2011-10-11 MED ORDER — SODIUM CHLORIDE 0.9 % IV SOLN
250.0000 mL | INTRAVENOUS | Status: DC | PRN
  Administered 2011-10-11: 10 mL via INTRAVENOUS

## 2011-10-11 MED ORDER — MUPIROCIN 2 % EX OINT
1.0000 "application " | TOPICAL_OINTMENT | Freq: Two times a day (BID) | CUTANEOUS | Status: DC
  Administered 2011-10-11 – 2011-10-14 (×6): 1 via NASAL
  Filled 2011-10-11: qty 22

## 2011-10-11 MED ORDER — ONDANSETRON 4 MG PO TBDP
4.0000 mg | ORAL_TABLET | Freq: Once | ORAL | Status: AC
  Administered 2011-10-11: 4 mg via ORAL
  Filled 2011-10-11: qty 1

## 2011-10-11 MED ORDER — SODIUM CHLORIDE 0.9 % IV SOLN
INTRAVENOUS | Status: DC
  Administered 2011-10-11: 800 mL via INTRAVENOUS

## 2011-10-11 MED ORDER — PANTOPRAZOLE SODIUM 40 MG IV SOLR
40.0000 mg | Freq: Once | INTRAVENOUS | Status: AC
  Administered 2011-10-11: 40 mg via INTRAVENOUS
  Filled 2011-10-11: qty 40

## 2011-10-11 MED ORDER — AMLODIPINE BESYLATE 10 MG PO TABS
10.0000 mg | ORAL_TABLET | Freq: Every day | ORAL | Status: DC
  Administered 2011-10-11 – 2011-10-14 (×3): 10 mg via ORAL
  Filled 2011-10-11 (×4): qty 1

## 2011-10-11 MED ORDER — NEPRO/CARBSTEADY PO LIQD
237.0000 mL | Freq: Two times a day (BID) | ORAL | Status: DC
  Administered 2011-10-12 – 2011-10-14 (×4): 237 mL via ORAL

## 2011-10-11 MED ORDER — SODIUM CHLORIDE 0.9 % IJ SOLN
3.0000 mL | Freq: Two times a day (BID) | INTRAMUSCULAR | Status: DC
  Administered 2011-10-12: 3 mL via INTRAVENOUS

## 2011-10-11 MED ORDER — SODIUM CHLORIDE 0.9 % IJ SOLN: 3.0000 mL | INTRAMUSCULAR | Status: DC | PRN

## 2011-10-11 MED ORDER — ONDANSETRON HCL 4 MG/2ML IJ SOLN: 4.0000 mg | INTRAMUSCULAR | Status: DC | PRN

## 2011-10-11 MED ORDER — LEVOFLOXACIN IN D5W 750 MG/150ML IV SOLN
750.0000 mg | INTRAVENOUS | Status: DC
  Filled 2011-10-11: qty 150

## 2011-10-11 MED ORDER — METOPROLOL SUCCINATE ER 100 MG PO TB24
100.0000 mg | ORAL_TABLET | Freq: Two times a day (BID) | ORAL | Status: DC
  Administered 2011-10-12 – 2011-10-14 (×3): 100 mg via ORAL
  Filled 2011-10-11 (×7): qty 1

## 2011-10-11 MED ORDER — ISOSORBIDE MONONITRATE ER 30 MG PO TB24
30.0000 mg | ORAL_TABLET | Freq: Every day | ORAL | Status: DC
  Administered 2011-10-11 – 2011-10-14 (×3): 30 mg via ORAL
  Filled 2011-10-11 (×5): qty 1

## 2011-10-11 NOTE — ED Notes (Signed)
Pt states he is out of it most of the time, but states every time he sneezes his family wants him to come to the hospital. Pt alert at present. Is aware he is at [facility]

## 2011-10-11 NOTE — ED Notes (Signed)
Report given to [facility] with [facility]

## 2011-10-11 NOTE — ED Notes (Signed)
April RN with dialysis cath.

## 2011-10-11 NOTE — H&P (Signed)
History and Physical Examination  Date: 10/11/2011  Patient name: [name] Medical record number: [id number] Date of birth: [birth year] Age: 61 y.o. Gender: male PCP: Toma Deiters, MD  Chief Complaint:  Chief Complaint  Patient presents with  . Emesis    History of Present Illness: [name] is an 61 y.o. male with with history of severe peripheral vascular disease, long tobacco use history  as well end stage renal disease with hemodialysis. He had recently been hospitalized for severe sepsis due to C. difficile complicated by respiratory failure and sepsis. He was recently discharged home 08/23/2011. He presented with ischemic left foot on 09/08/2011. He has been followed by vascular surgery with plan for femoropopliteal bypass which he had 09/09/2011 per Dr. Myra Gianotti.  He had ongoing ischemic changes to left lower extremity and ultimately underwent left transmetatarsal amputation 09/12/2011 per Dr. Myra Gianotti. He was sent to [facility] rehab and was recently discharge on 07/03 to home with family.   His family brought him to [facility] ED today because pt has had symptoms of nausea and vomiting and bilious emesis on several occasions in the last 3 days.  Also he has had a reported mild nonproductive cough.  Pt reports that he has been vomiting because he has been eating to excess.  He has been swelling more recently.   He had his full hemodialysis treatment yesterday.  Pt says that he has been sleeping during the day because he stays up all night watching television.  He suffers from longterm chronic insomnia.  He was seen in ED and his CXR was concerning for possible aspiration pneumonia.  He was accepted for transfer to [facility] step down unit for further treatment.  He was started on broad spectrum antibiotics and admission was requested.  The patient however states that he feels fine and that he would like to get out to the hospital as soon as possible.    Past Medical History Past Medical  History  Diagnosis Date  . Hyperlipidemia   . ESRD (end stage renal disease)     a. MWF dialysis in [place] (followed by Dr. Kristian Covey)  . Arthritis   . Claudication   . Stroke 2005  . Anemia   . Chronic diastolic CHF (congestive heart failure) 07/2010    a. 07/2008 Echo EF 50-55%, mild-mod LVH, Gr 1 DD.  Marland Kitchen Peripheral vascular occlusive disease     a. 07/2011 Periph Angio: No signif Ao-illiac dzs, LCF stenosis, 100% LSFA w recon above knee pop and 3 vessel runoff, 100% RSFA w/ recon above knee --> pending L Fem to below Knee pop bypass.  Marland Kitchen History of tobacco abuse   . Hypertension     takes Amlodipine,Metoprolol,and Prinivil daily  . Dyspnea on exertion     with exertion       Stage IV decubitus skin ulcer  Past Surgical History Past Surgical History  Procedure Date  . Arteriovenous graft placement   . Av fistula placement 12/10/2000    Right brachiocephalic arteriovenous   . Hernia repair   . Aortagram 07/29/2011    Abdominal Aortagram  . Cardiac catheterization 08/01/11    Left heart catheterization  . Femoral-popliteal bypass graft 09/09/2011    Procedure: BYPASS GRAFT FEMORAL-POPLITEAL ARTERY;  Surgeon: [name], MD;  Location: [facility] OR;  Service: Vascular;  Laterality: Left;  . Amputation 09/12/2011    Procedure: AMPUTATION BELOW KNEE;  Surgeon: [name], MD;  Location: [facility] OR;  Service: Vascular;  Laterality: Left;  TRANSMETATARSAL    Home Meds: Prior to Admission medications   Medication Sig Start Date End Date Taking? Authorizing Provider  cinacalcet (SENSIPAR) 30 MG tablet Take 2 tablets (60 mg total) by mouth daily with breakfast. 09/16/11 12/15/11 Yes Lars Mage, PA  gabapentin (NEURONTIN) 100 MG capsule Take 2 capsules (200 mg total) by mouth daily. 09/26/11  Yes Evlyn Kanner Love, PA  isosorbide mononitrate (IMDUR) 30 MG 24 hr tablet Take 30 mg by mouth daily.   Yes Historical Provider, MD  lanthanum (FOSRENOL) 1000 MG chewable tablet Chew 1,000 mg by mouth 2 (two)  times daily with a meal.   Yes Historical Provider, MD  lidocaine-prilocaine (EMLA) cream Apply 1 application topically as needed.   Yes Historical Provider, MD  multivitamin (RENA-VIT) TABS tablet Take 1 tablet by mouth daily.   Yes Historical Provider, MD  amLODipine (NORVASC) 10 MG tablet Take 10 mg by mouth daily.    Historical Provider, MD  antiseptic oral rinse (BIOTENE) LIQD 15 mLs by Mouth Rinse route 2 times daily at 12 noon and 4 pm. 09/16/11   Lars Mage, PA  Glycerin, Adult, 2.1 G SUPP Place 1 suppository rectally as needed. 09/16/11   Lars Mage, PA  metoprolol succinate (TOPROL-XL) 100 MG 24 hr tablet Take 100 mg by mouth 2 (two) times daily. Take with or immediately following a meal.    Historical Provider, MD  Nutritional Supplements (FEEDING SUPPLEMENT, NEPRO CARB STEADY,) LIQD Take 237 mLs by mouth 2 (two) times daily between meals. 09/16/11   Lars Mage, PA  simvastatin (ZOCOR) 5 MG tablet Take 5 mg by mouth at bedtime.    Historical Provider, MD    Allergies: Review of patient's allergies indicates no known allergies.  Social History:  History   Social History  . Marital Status: Legally Separated    Spouse Name: N/A    Number of Children: N/A  . Years of Education: N/A   Occupational History  . DISABLED    Social History Main Topics  . Smoking status: Former Smoker -- 1.0 packs/day for 40 years    Types: Cigarettes    Quit date: 03/25/2011  . Smokeless tobacco: Not on file  . Alcohol Use: No  . Drug Use: No  . Sexually Active: Not Currently   Other Topics Concern  . Not on file   Social History Narrative   Lives in [place].  His cousin helps him out and drives him to appts. He quit smoking earlier this year.   Family History:  Family History  Problem Relation Age of Onset  . Breast cancer Mother   . Cancer Father   . Anesthesia problems Neg Hx   . Hypotension Neg Hx   . Malignant hyperthermia Neg Hx   . Pseudochol deficiency Neg Hx      Review of Systems: Pertinent items are noted in HPI. All other systems reviewed and reported as negative.   Physical Exam: Blood pressure 108/72, pulse 66, temperature 97.4 F (36.3 C), temperature source Oral, resp. rate 14, height 5\' 6"  (1.676 m), weight 85.1 kg (187 lb 9.8 oz), SpO2 100.00%. Constitutional: He is oriented to person, place, and time.  Frail elderly male in no distress HENT: Pupils equally round and reactive to light, extraocular eye movements intact  Head: Normocephalic. Oral mucosa pink and moist. He is edentulous.  Neck: Neck supple. No thyromegaly present.  Cardiovascular: Normal rate. An irregularly irregular rhythm present.  Pulmonary/Chest: BBS with  rare ant congestion noises heard. He has no wheezes.  Abdominal: He exhibits no distension. There is no tenderness. Normal BS.  Neurological: He is alert and oriented to person, place, and time. Short term memory deficit.  Skin: Surgical site clean dry and dressed with minimal tenderness.  Psychiatric: Mood is flat but appropriate  motor strength is 5/5 in bilateral bicep, tricep, grip, deltoid  Right lower extremity is 5/5 in the hip flexor knee extensor and ankle dorsiflexor  Left lower extremity is 4/5 in the hip flexor knee extensor and 2 minus at the ankle dorsiflexor plantar  flexor  Sensation is reduced in the right foot to light touch however left foot was not tested due to recent surgery   transmetatarsal amputation site nontender to palpation  Patient is comfortable and expressing no outward signs of pain   Lab  And Imaging results:  Results for orders placed during the hospital encounter of 10/11/11 (from the past 24 hour(s))  CBC WITH DIFFERENTIAL     Status: Abnormal   Collection Time   10/11/11 11:50 AM      Component Value Range   WBC 6.4  4.0 - 10.5 K/uL   RBC 2.91 (*) 4.22 - 5.81 MIL/uL   Hemoglobin 8.8 (*) 13.0 - 17.0 g/dL   HCT 16.1 (*) 09.6 - 04.5 %   MCV 97.9  78.0 - 100.0 fL   MCH  30.2  26.0 - 34.0 pg   MCHC 30.9  30.0 - 36.0 g/dL   RDW 40.9 (*) 81.1 - 91.4 %   Platelets 181  150 - 400 K/uL   Neutrophils Relative 73  43 - 77 %   Lymphocytes Relative 19  12 - 46 %   Monocytes Relative 5  3 - 12 %   Eosinophils Relative 3  0 - 5 %   Basophils Relative 0  0 - 1 %   Neutro Abs 4.7  1.7 - 7.7 K/uL   Lymphs Abs 1.2  0.7 - 4.0 K/uL   Monocytes Absolute 0.3  0.1 - 1.0 K/uL   Eosinophils Absolute 0.2  0.0 - 0.7 K/uL   Basophils Absolute 0.0  0.0 - 0.1 K/uL   RBC Morphology BASOPHILIC STIPPLING     WBC Morphology ANISOCYTES    COMPREHENSIVE METABOLIC PANEL     Status: Abnormal   Collection Time   10/11/11 11:50 AM      Component Value Range   Sodium 139  135 - 145 mEq/L   Potassium 3.5  3.5 - 5.1 mEq/L   Chloride 99  96 - 112 mEq/L   CO2 31  19 - 32 mEq/L   Glucose, Bld 97  70 - 99 mg/dL   BUN 32 (*) 6 - 23 mg/dL   Creatinine, Ser 7.82 (*) 0.50 - 1.35 mg/dL   Calcium 9.2  8.4 - 95.6 mg/dL   Total Protein 5.4 (*) 6.0 - 8.3 g/dL   Albumin 1.6 (*) 3.5 - 5.2 g/dL   AST 13  0 - 37 U/L   ALT 6  0 - 53 U/L   Alkaline Phosphatase 109  39 - 117 U/L   Total Bilirubin 0.3  0.3 - 1.2 mg/dL   GFR calc non Af Amer 11 (*) >90 mL/min   GFR calc Af Amer 13 (*) >90 mL/min  LIPASE, BLOOD     Status: Abnormal   Collection Time   10/11/11 11:50 AM      Component Value Range   Lipase 6 (*)  11 - 59 U/L  LACTIC ACID, PLASMA     Status: Normal   Collection Time   10/11/11 11:50 AM      Component Value Range   Lactic Acid, Venous 0.7  0.5 - 2.2 mmol/L  PROCALCITONIN     Status: Normal   Collection Time   10/11/11 11:50 AM      Component Value Range   Procalcitonin 3.82    TROPONIN I     Status: Normal   Collection Time   10/11/11 11:50 AM      Component Value Range   Troponin I <0.30  <0.30 ng/mL   Impression   INSOMNIA, CHRONIC  Atherosclerosis of native arteries of the extremities with intermittent claudication  History of tobacco abuse  Peripheral vascular occlusive  disease s/p recent Left transmetatarsal amputation  CKD (chronic kidney disease) stage V requiring chronic dialysis  Sacral decubitus ulcer stage IV  ESRD on hemodialysis  Anemia  Nausea and vomiting in adult  Question of Aspiration pneumonia  Plan  The patient is in a step down bed for close monitoring overnight.  The ED physician was highly concerned that the patient may acutely deteriorate.  At this time, the patient is hemodynamically stable.  I plan to repeat the CXR in AM.  At this time, it is not certain that pt truly has aspiration pneumonia and the xray is not definitive.  The patient has cough but no fever and normal WBC count.  The patient has mild nausea and vomiting associated with eating.  Will monitor.  No diarrhea reported.  Pt has a history of severe significant recalcitrant c. Diff and I am concerned about restarting him on broad spectrum antibiotics when he does not appear to be in resp distress and pneumonia is not certain.  Will de-escalate antibiotics for now.  Start probiotics.  Ask nephrology to see the patient while he is here for HD.  Meds for nausea provided.  Protonix ordered.  Follow CBC.  WOC consult. Engineer, civil (consulting).  PT consult.    Please see orders and follow hospital course.  Step down team will assume care in AM.    Critical care time spent 44 mins    Kyleigh Nannini [facility] MD [facility] [place], [place] [phone] 10/11/2011, 5:43 PM

## 2011-10-11 NOTE — ED Notes (Signed)
Operator attempting to page on call dialysis nurse to come in to  access pt's port

## 2011-10-11 NOTE — ED Notes (Signed)
Pt c/o vomiting dark [name] liquid Thursday night and yesterday. Pt vomiting green liquid today. Also c/o swelling to all extremities. Pt is on dialysis and had full treatment yesterday.

## 2011-10-11 NOTE — ED Provider Notes (Signed)
History     CSN: [id number]  Arrival date & time 10/11/11  1126   First MD Initiated Contact with Patient 10/11/11 1133      Chief Complaint  Patient presents with  . Emesis     HPI Pt was seen at 1135.  Per pt and family, c/o gradual onset and persistence of multiple intermittent episodes of N/V for the past 3 days.  States the emesis was "dark" liquid Thurs and Fri, today it was "green."  Has been associated with "swelling all over" and generalized weakness/fatigue.  Pt went to his usual HD yesterday.  Denies fevers, no black or blood in stools, no CP/SOB, no abd pain.     Past Medical History  Diagnosis Date  . Hyperlipidemia   . ESRD (end stage renal disease)     a. MWF dialysis in [place] (followed by Dr. Kristian Covey)  . Arthritis   . Claudication   . Stroke 2005  . Anemia   . Chronic diastolic CHF (congestive heart failure) 07/2010    a. 07/2008 Echo EF 50-55%, mild-mod LVH, Gr 1 DD.  Marland Kitchen Peripheral vascular occlusive disease     a. 07/2011 Periph Angio: No signif Ao-illiac dzs, LCF stenosis, 100% LSFA w recon above knee pop and 3 vessel runoff, 100% RSFA w/ recon above knee --> pending L Fem to below Knee pop bypass.  Marland Kitchen History of tobacco abuse   . Hypertension     takes Amlodipine,Metoprolol,and Prinivil daily  . Dyspnea on exertion     with exertion    Past Surgical History  Procedure Date  . Arteriovenous graft placement   . Av fistula placement 12/10/2000    Right brachiocephalic arteriovenous   . Hernia repair   . Aortagram 07/29/2011    Abdominal Aortagram  . Cardiac catheterization 08/01/11    Left heart catheterization  . Femoral-popliteal bypass graft 09/09/2011    Procedure: BYPASS GRAFT FEMORAL-POPLITEAL ARTERY;  Surgeon: [name], MD;  Location: [facility] OR;  Service: Vascular;  Laterality: Left;  . Amputation 09/12/2011    Procedure: AMPUTATION BELOW KNEE;  Surgeon: [name], MD;  Location: [facility] OR;  Service: Vascular;  Laterality: Left;   TRANSMETATARSAL    Family History  Problem Relation Age of Onset  . Breast cancer Mother   . Cancer Father   . Anesthesia problems Neg Hx   . Hypotension Neg Hx   . Malignant hyperthermia Neg Hx   . Pseudochol deficiency Neg Hx     History  Substance Use Topics  . Smoking status: Former Smoker -- 1.0 packs/day for 40 years    Types: Cigarettes    Quit date: 03/25/2011  . Smokeless tobacco: Not on file  . Alcohol Use: No    Review of Systems ROS: Statement: All systems negative except as marked or noted in the HPI; Constitutional: Negative for fever and chills. ; ; Eyes: Negative for eye pain, redness and discharge. ; ; ENMT: Negative for ear pain, hoarseness, nasal congestion, sinus pressure and sore throat. ; ; Cardiovascular: Negative for chest pain, palpitations, diaphoresis, dyspnea and +peripheral edema. ; ; Respiratory: Negative for cough, wheezing and stridor. ; ; Gastrointestinal: +N/V.  Negative for diarrhea, abdominal pain, blood in stool, hematemesis, jaundice and rectal bleeding.; ; Genitourinary: Negative for dysuria, flank pain and hematuria. ; ; Musculoskeletal: Negative for back pain and neck pain. Negative for trauma.; ; Skin: Negative for pruritus, rash, abrasions, blisters, bruising and skin lesion.; ; Neuro: +generalized weakness/fatigue.  Negative for  headache, lightheadedness and neck stiffness. Negative for altered level of consciousness , altered mental status, extremity weakness, paresthesias, involuntary movement, seizure and syncope.     Allergies  Review of patient's allergies indicates no known allergies.  Home Medications   Current Outpatient Rx  Name Route Sig Dispense Refill  . AMLODIPINE BESYLATE 10 MG PO TABS Oral Take 10 mg by mouth daily.    Marland Kitchen BIOTENE DRY MOUTH MT LIQD Mouth Rinse 15 mLs by Mouth Rinse route 2 times daily at 12 noon and 4 pm.    . CINACALCET HCL 30 MG PO TABS Oral Take 2 tablets (60 mg total) by mouth daily with breakfast. 60  tablet   . GABAPENTIN 100 MG PO CAPS Oral Take 2 capsules (200 mg total) by mouth daily. 60 capsule 1  . GLYCERIN (LAXATIVE) 2.1 G RE SUPP Rectal Place 1 suppository rectally as needed.    . ISOSORBIDE MONONITRATE ER 30 MG PO TB24 Oral Take 30 mg by mouth daily.    Marland Kitchen METOPROLOL SUCCINATE ER 100 MG PO TB24 Oral Take 100 mg by mouth 2 (two) times daily. Take with or immediately following a meal.    . RENA-VITE PO TABS Oral Take 1 tablet by mouth daily.    Marland Kitchen NEPRO/CARB STEADY PO LIQD Oral Take 237 mLs by mouth 2 (two) times daily between meals.    Marland Kitchen SIMVASTATIN 5 MG PO TABS Oral Take 5 mg by mouth at bedtime.      BP 106/21  Pulse 81  Temp 98 F (36.7 C) (Oral)  Resp 16  Ht 5\' 6"  (1.676 m)  Wt 130 lb (58.968 kg)  BMI 20.98 kg/m2  Physical Exam 1155: Physical examination:  Nursing notes reviewed; Vital signs and O2 SAT reviewed;  Constitutional: Thin, frail, In no acute distress; Head:  Normocephalic, atraumatic; Eyes: EOMI, PERRL, No scleral icterus; ENMT: Mouth and pharynx normal, Mucous membranes dry; Neck: Supple, Full range of motion, No lymphadenopathy; Cardiovascular: Regular rate and rhythm, No gallop; Respiratory: Breath sounds clear & equal bilaterally, No wheezes.  Speaking full sentences with ease, Normal respiratory effort/excursion; Chest: Nontender, Movement normal; Abdomen: Soft, Nontender, Nondistended, Normal bowel sounds, Rectal exam performed w/permission of pt and ED RN chaparone present.  Anal tone normal.  Non-tender, soft light brown stool in rectal vault, heme positive.  No fissures, no external hemorrhoids, no palp masses.;;  Extremities:  No tenderness, +1 edema bilat UE's and LE's. No calf edema or asymmetry, no deformity, +left transmetatarsal amputation as per hx with DSD C/D/I.; Neuro: AA&Ox3, Major CN grossly intact.  Speech clear. No gross focal motor or sensory deficits in extremities.; Skin: Color normal, Warm, Dry, +sacral decub.   ED Course   Procedures   MDM  MDM Reviewed: nursing note, vitals and previous chart Reviewed previous: ECG and labs Interpretation: labs, ECG and x-ray      Date: 10/11/2011  Rate: 76  Rhythm: normal sinus rhythm  QRS Axis: normal  Intervals: normal  ST/T Wave abnormalities: nonspecific T wave changes and nonspecific ST/T changes; flipped T-wave V4, NS STTW changes V5-V6  Conduction Disutrbances:nonspecific intraventricular conduction delay  Narrative Interpretation:   Old EKG Reviewed: unchanged; no significant changes from previous EKG dated 08/26/2011.  Results for orders placed during the hospital encounter of 10/11/11  CBC WITH DIFFERENTIAL      Component Value Range   WBC 6.4  4.0 - 10.5 K/uL   RBC 2.91 (*) 4.22 - 5.81 MIL/uL   Hemoglobin 8.8 (*) 13.0 -  17.0 g/dL   HCT 16.1 (*) 09.6 - 04.5 %   MCV 97.9  78.0 - 100.0 fL   MCH 30.2  26.0 - 34.0 pg   MCHC 30.9  30.0 - 36.0 g/dL   RDW 40.9 (*) 81.1 - 91.4 %   Platelets 181  150 - 400 K/uL   Neutrophils Relative 73  43 - 77 %   Lymphocytes Relative 19  12 - 46 %   Monocytes Relative 5  3 - 12 %   Eosinophils Relative 3  0 - 5 %   Basophils Relative 0  0 - 1 %   Neutro Abs 4.7  1.7 - 7.7 K/uL   Lymphs Abs 1.2  0.7 - 4.0 K/uL   Monocytes Absolute 0.3  0.1 - 1.0 K/uL   Eosinophils Absolute 0.2  0.0 - 0.7 K/uL   Basophils Absolute 0.0  0.0 - 0.1 K/uL   RBC Morphology BASOPHILIC STIPPLING     WBC Morphology ANISOCYTES    COMPREHENSIVE METABOLIC PANEL      Component Value Range   Sodium 139  135 - 145 mEq/L   Potassium 3.5  3.5 - 5.1 mEq/L   Chloride 99  96 - 112 mEq/L   CO2 31  19 - 32 mEq/L   Glucose, Bld 97  70 - 99 mg/dL   BUN 32 (*) 6 - 23 mg/dL   Creatinine, Ser 7.82 (*) 0.50 - 1.35 mg/dL   Calcium 9.2  8.4 - 95.6 mg/dL   Total Protein 5.4 (*) 6.0 - 8.3 g/dL   Albumin 1.6 (*) 3.5 - 5.2 g/dL   AST 13  0 - 37 U/L   ALT 6  0 - 53 U/L   Alkaline Phosphatase 109  39 - 117 U/L   Total Bilirubin 0.3  0.3 - 1.2 mg/dL   GFR  calc non Af Amer 11 (*) >90 mL/min   GFR calc Af Amer 13 (*) >90 mL/min  LIPASE, BLOOD      Component Value Range   Lipase 6 (*) 11 - 59 U/L  LACTIC ACID, PLASMA      Component Value Range   Lactic Acid, Venous 0.7  0.5 - 2.2 mmol/L  PROCALCITONIN      Component Value Range   Procalcitonin 3.82    TROPONIN I      Component Value Range   Troponin I <0.30  <0.30 ng/mL    Results for TAMMY, ERICSSON (MRN [id number]) as of 10/11/2011 13:13  Ref. Range 09/24/2011 16:15 09/26/2011 16:13 10/11/2011 11:50  Hemoglobin Latest Range: 13.0-17.0 g/dL 8.3 (L) 8.1 (L) 8.8 (L)  HCT Latest Range: 39.0-52.0 % 26.5 (L) 25.9 (L) 28.5 (L)    Dg Abd Acute W/chest 10/11/2011  *RADIOLOGY REPORT*  Clinical Data: Abdominal pain, nausea and vomiting.  ACUTE ABDOMEN SERIES (ABDOMEN 2 VIEW & CHEST 1 VIEW)  Comparison: Abdominal radiographs 08/28/2011.  Chest x-ray 09/10/2011.  Findings: Left internal jugular PermCath with the tips terminating in the right atrium and at the superior cavoatrial junction is unchanged.  Lung volumes are low.  Linear opacities in the left mid and lower lung are most consistent with atelectasis.  Patchy airspace consolidation and volume loss in the right middle and lower lobes.  Trace left and small right-sided pleural effusions. No pneumoperitoneum.  Supine and upright views of the abdomen demonstrate gas and stool scattered throughout the colon extending to the level of the distal rectum.  No pathologic distension of small bowel is noted.  Midline surgical staples are again noted.  Numerous vascular calcifications.  In addition, there are numerous egg-shell type calcifications in the left upper quadrant of the abdomen which appear to correspond with numerous calcified cystic lesions based on comparison with prior CT scans.  IMPRESSION: 1.  Nonobstructive bowel gas pattern. 2.  No pneumoperitoneum. 3.  Support apparatus and postoperative changes, as above. 4.  Air space consolidation and atelectasis  in the right middle and lower lobes, concerning for pneumonia and/or sequelae of aspiration. 5.  Small right and trace left-sided pleural effusions. 6.  Atherosclerosis.  Original Report Authenticated By: Florencia Reasons, M.D.    1300:  Dx testing d/w pt and family.  Questions answered.  Verb understanding, agreeable to admit.  Family is requesting transfer to [facility], as pt has had a complicated medical course over the past 1+ month (severe sepsis due to cdiff, SBO, fem-pop bypass, partial foot amputation, rehab).  I believe this is reasonable after EPIC chart review.  T/C to [facility] Dr. Karilyn Cota, case discussed, including:  HPI, pertinent PM/SHx, VS/PE, dx testing, ED course and treatment:  Pt with very complicated hx, agrees with transfer to [facility].  1330:  T/C to [facility] Dr. Lavera Guise, case discussed, including:  HPI, pertinent PM/SHx, VS/PE, dx testing, ED course and treatment:  Agreeable to admit, requests to obtain stepdown bed to team 6/Dr. Lavera Guise.  After multiple ED RN attempts, no peripheral IV access.  HD RN has arrived to access his HD catheter.  Will dose protonix, IV abx for HCAP.  Pt continues to mentate per baseline per multiple family members at bedside.  SBP now 140's, HR 80's.  Remains afebrile.  ED RN reports difficulty in obtaining peripheral O2 Sat (will occasionally read 100% R/A then nothing), but clinically the pt is in NO resp distress and is speaking full sentences with ease.  No N/V while in the ED, no stooling while in the ED.  Stable for transfer at this time.      Laray Anger, DO 10/13/11 1625

## 2011-10-11 NOTE — ED Notes (Signed)
[facility] here to transport pt 

## 2011-10-11 NOTE — ED Notes (Signed)
Dialysis nurse called back and stated she would be here in about 30 min

## 2011-10-11 NOTE — Consult Note (Signed)
ANTIBIOTIC CONSULT NOTE - INITIAL  Pharmacy Consult for Vancomycin, Levaquin, & Cefepime  Indication: rule out pneumonia  No Known Allergies  Patient Measurements: Height: 5\' 6"  (167.6 cm) Weight: 130 lb (58.968 kg) IBW/kg (Calculated) : 63.8   Vital Signs: Temp: 98 F (36.7 C) (07/20 1135) Temp src: Oral (07/20 1135) BP: 142/66 mmHg (07/20 1338) Pulse Rate: 89  (07/20 1338) Intake/Output from previous day:   Intake/Output from this shift:    Labs:  Basename 10/11/11 1150  WBC 6.4  HGB 8.8*  PLT 181  LABCREA --  CREATININE 5.10*   Estimated Creatinine Clearance: 12.5 ml/min (by C-G formula based on Cr of 5.1). No results found for this basename: VANCOTROUGH:2,VANCOPEAK:2,VANCORANDOM:2,GENTTROUGH:2,GENTPEAK:2,GENTRANDOM:2,TOBRATROUGH:2,TOBRAPEAK:2,TOBRARND:2,AMIKACINPEAK:2,AMIKACINTROU:2,AMIKACIN:2, in the last 72 hours   Microbiology: No results found for this or any previous visit (from the past 720 hour(s)).  Medical History: Past Medical History  Diagnosis Date  . Hyperlipidemia   . ESRD (end stage renal disease)     a. MWF dialysis in [place] (followed by Dr. Kristian Covey)  . Arthritis   . Claudication   . Stroke 2005  . Anemia   . Chronic diastolic CHF (congestive heart failure) 07/2010    a. 07/2008 Echo EF 50-55%, mild-mod LVH, Gr 1 DD.  Marland Kitchen Peripheral vascular occlusive disease     a. 07/2011 Periph Angio: No signif Ao-illiac dzs, LCF stenosis, 100% LSFA w recon above knee pop and 3 vessel runoff, 100% RSFA w/ recon above knee --> pending L Fem to below Knee pop bypass.  Marland Kitchen History of tobacco abuse   . Hypertension     takes Amlodipine,Metoprolol,and Prinivil daily  . Dyspnea on exertion     with exertion    Medications:  Scheduled:    . ceFEPime (MAXIPIME) IV  1 g Intravenous Q24H  . levofloxacin (LEVAQUIN) IV  750 mg Intravenous Q48H  . ondansetron  4 mg Oral Once  . pantoprazole  40 mg Intravenous Once  . DISCONTD: levofloxacin (LEVAQUIN) IV  750 mg  Intravenous Q24H   Assessment: 60yoM with ESRD requiring dialysis admitted to r/o pna  Goal of Therapy:  Vancomycin trough level 15-20 mcg/ml  Plan: Vancomycin 1gm iv today then after HD Cefepime 1gm iv today then after HD Levaquin 750mg  iv today then 500mg  iv q48hrs F/U cultures and labs per protocol  Valrie Hart A 10/11/2011,1:58 PM

## 2011-10-11 NOTE — ED Notes (Signed)
Pt at xray at this time.

## 2011-10-11 NOTE — ED Notes (Signed)
Dialysis nurse here to access [facility] a cath. Dr. Clarene Duke aware of O2 sats not reading. pleth reading not consistent with multiple sites. Pt resp even/nonlabored. Speaking complete sentences without difficulty. nad noted at present time.

## 2011-10-11 NOTE — ED Notes (Signed)
Pt is aware of a urine sample, pt states he does not make urine. RN is aware.

## 2011-10-12 ENCOUNTER — Inpatient Hospital Stay (HOSPITAL_COMMUNITY): Payer: Medicare Other

## 2011-10-12 LAB — COMPREHENSIVE METABOLIC PANEL
ALT: 8 U/L (ref 0–53)
Alkaline Phosphatase: 103 U/L (ref 39–117)
BUN: 38 mg/dL — ABNORMAL HIGH (ref 6–23)
CO2: 23 mEq/L (ref 19–32)
Chloride: 101 mEq/L (ref 96–112)
GFR calc Af Amer: 12 mL/min — ABNORMAL LOW (ref >90)
Glucose, Bld: 69 mg/dL — ABNORMAL LOW (ref 70–99)
Potassium: 4.2 mEq/L (ref 3.5–5.1)
Sodium: 140 mEq/L (ref 135–145)
Total Bilirubin: 0.2 mg/dL — ABNORMAL LOW (ref 0.3–1.2)
Total Protein: 5.1 g/dL — ABNORMAL LOW (ref 6.0–8.3)

## 2011-10-12 LAB — CBC
HCT: 29.6 % — ABNORMAL LOW (ref 39.0–52.0)
MCHC: 30.1 g/dL (ref 30.0–36.0)
Platelets: 174 10*3/uL (ref 150–400)
RDW: 23.9 % — ABNORMAL HIGH (ref 11.5–15.5)
WBC: 5.2 10*3/uL (ref 4.0–10.5)

## 2011-10-12 MED ORDER — SODIUM CHLORIDE 0.9 % IV SOLN
250.0000 mL | INTRAVENOUS | Status: DC | PRN
  Administered 2011-10-11: 20 mL/h via INTRAVENOUS

## 2011-10-12 MED ORDER — HYDROCODONE-ACETAMINOPHEN 5-325 MG PO TABS
1.0000 | ORAL_TABLET | ORAL | Status: DC | PRN
  Administered 2011-10-12 – 2011-10-14 (×6): 1 via ORAL
  Filled 2011-10-12 (×6): qty 1

## 2011-10-12 MED ORDER — CINACALCET HCL 30 MG PO TABS
60.0000 mg | ORAL_TABLET | Freq: Every day | ORAL | Status: DC
  Administered 2011-10-12 – 2011-10-13 (×2): 60 mg via ORAL
  Filled 2011-10-12 (×3): qty 2

## 2011-10-12 MED ORDER — HYDROCODONE-ACETAMINOPHEN 5-325 MG PO TABS: 1.0000 | ORAL_TABLET | ORAL | Status: DC | PRN

## 2011-10-12 MED ORDER — MORPHINE SULFATE 2 MG/ML IJ SOLN: 0.5000 mg | INTRAMUSCULAR | Status: DC | PRN

## 2011-10-12 MED ORDER — LANTHANUM CARBONATE 500 MG PO CHEW
1000.0000 mg | CHEWABLE_TABLET | Freq: Two times a day (BID) | ORAL | Status: DC
  Administered 2011-10-12 – 2011-10-14 (×3): 1000 mg via ORAL
  Filled 2011-10-12 (×7): qty 2

## 2011-10-12 MED ORDER — SODIUM CHLORIDE 0.9 % IV SOLN: 250.0000 mL | INTRAVENOUS | Status: DC | PRN

## 2011-10-12 MED ORDER — LEVOFLOXACIN IN D5W 500 MG/100ML IV SOLN
500.0000 mg | INTRAVENOUS | Status: DC
  Administered 2011-10-13: 500 mg via INTRAVENOUS
  Filled 2011-10-12: qty 100

## 2011-10-12 MED ORDER — RANITIDINE HCL 150 MG/10ML PO SYRP
75.0000 mg | ORAL_SOLUTION | Freq: Once | ORAL | Status: AC
  Administered 2011-10-12: 75 mg via ORAL
  Filled 2011-10-12: qty 10

## 2011-10-12 NOTE — Evaluation (Signed)
Physical Therapy Evaluation Patient Details Name: [name] MRN: [id number] DOB: 25-Aug-1950 Today's Date: 10/12/2011 Time: 4098-1191 PT Time Calculation (min): 27 min  PT Assessment / Plan / Recommendation Clinical Impression  Patient is a 61 yo male admitted with emesis and ? asp pna.  Patient was recently discharged from Inpatient Rehab on 09/26/11.  Patient able to transfer bed to chair with min assist.  Patient has all equipment needed at home.  Recommend HHPT resume at discharge.  Will continue PT acutely for mobility training.    PT Assessment  Patient needs continued PT services    Follow Up Recommendations  Home health PT;Supervision - Intermittent    Barriers to Discharge        Equipment Recommendations  None recommended by PT    Recommendations for Other Services     Frequency Min 3X/week    Precautions / Restrictions Precautions Precautions: Fall Precaution Comments: Sacral wound Restrictions Weight Bearing Restrictions: Yes LLE Weight Bearing: Non weight bearing   Pertinent Vitals/Pain Pain limiting factor during today's session.      Mobility  Bed Mobility Bed Mobility: Rolling Left;Left Sidelying to Sit;Sitting - Scoot to Edge of Bed Rolling Left: With rail;4: Min guard Left Sidelying to Sit: 4: Min assist;With rails Sitting - Scoot to Edge of Bed: 4: Min guard Details for Bed Mobility Assistance: Cues for using rail.  Assist to lift trunk from bed Transfers Transfers: Sit to Stand;Stand to Sit;Stand Pivot Transfers Sit to Stand: 4: Min assist;With upper extremity assist Stand to Sit: 4: Min guard;With upper extremity assist;With armrests;To chair/3-in-1 Stand Pivot Transfers: 4: Min assist Details for Transfer Assistance: Patient able to instruct PT on how to assist him with his transfers.  Reminders for safety and hand placement. Ambulation/Gait Ambulation/Gait Assistance: Not tested (comment)    Exercises     PT Diagnosis: Generalized  weakness;Acute pain;Difficulty walking  PT Problem List: Decreased strength;Decreased activity tolerance;Decreased mobility;Pain;Decreased skin integrity PT Treatment Interventions: DME instruction;Functional mobility training;Therapeutic activities;Therapeutic exercise;Patient/family education   PT Goals Acute Rehab PT Goals PT Goal Formulation: With patient Time For Goal Achievement: 10/19/11 Potential to Achieve Goals: Good Pt will go Supine/Side to Sit: Independently;with HOB not 0 degrees (comment degree) PT Goal: Supine/Side to Sit - Progress: Goal set today Pt will go Sit to Stand: with supervision;with upper extremity assist PT Goal: Sit to Stand - Progress: Goal set today Pt will go Stand to Sit: with supervision;with upper extremity assist PT Goal: Stand to Sit - Progress: Goal set today Pt will Transfer Bed to Chair/Chair to Bed: with supervision PT Transfer Goal: Bed to Chair/Chair to Bed - Progress: Goal set today  Visit Information  Last PT Received On: 10/12/11 Assistance Needed: +1    Subjective Data  Subjective: "I'd like a Big Mac" Patient Stated Goal: To go home soon   Prior Functioning  Home Living Lives With: Alone Available Help at Discharge: Family Type of Home: House Home Access: Level entry Entrance Stairs-Number of Steps: ~ 3 Entrance Stairs-Rails: Can reach both Home Layout: One level Bathroom Shower/Tub: Tub/shower unit;Door [employer] Toilet: Standard Bathroom Accessibility: Yes How Accessible: Accessible via walker;Accessible via wheelchair Home Adaptive Equipment: Bedside commode/3-in-1;Shower chair with back;Straight cane;Walker - rolling;Wheelchair - manual Tax inspector) Prior Function Level of Independence: Needs assistance (sister and cousin are helping patient at home.  Also HH svs) Needs Assistance: Light Housekeeping;Meal Prep Able to Take Stairs?: No Driving: No Communication Communication: No difficulties Dominant Hand: Right     Cognition  Overall Cognitive Status: Appears within functional limits for tasks assessed/performed Arousal/Alertness: Awake/alert Orientation Level: Appears intact for tasks assessed Behavior During Session: [facility] for tasks performed    Extremity/Trunk Assessment Right Upper Extremity Assessment RUE ROM/Strength/Tone: [facility] for tasks assessed Left Upper Extremity Assessment LUE ROM/Strength/Tone: WFL for tasks assessed Right Lower Extremity Assessment RLE ROM/Strength/Tone: Deficits RLE ROM/Strength/Tone Deficits: Weakness 4-/5; edema in foot Left Lower Extremity Assessment LLE ROM/Strength/Tone: Deficits LLE ROM/Strength/Tone Deficits: Decreased strength.  Amputation of forefoot.  NWB   Balance    End of Session PT - End of Session Equipment Utilized During Treatment: Gait belt Activity Tolerance: Patient tolerated treatment well;Patient limited by pain Patient left: in chair;with call bell/phone within reach;with family/visitor present Nurse Communication: Mobility status  GP     Vena Austria 10/12/2011, 12:02 PM Durenda Hurt. Renaldo Fiddler, [facility] Acute Rehab Services Pager [phone]

## 2011-10-12 NOTE — Consult Note (Signed)
WOC consult Note Reason for Consult:Stage IV Pressure ulcer at Sacrum.  Also noted is necrotic left foot incision line and Unstageable PrU on left medial heel-all POA. Wound type:Pressure ulcers (2), incisional necrosis. Pressure Ulcer POA: Yes Measurement:Sacral ulcer:  Stage IV (healing):  5.5cm x 5cm x 1.5cm with undermining from 1-5 o'clock measuring 2.5cm.  Unstageable PrU on the medial aspect of the left heel measuring 3cm x 3cm x .0cm.  Necrotic eschar noted along the insicion line of the left metatarsal head amputation measuring 10cm x 2cm.  Area is 90% covered with eschar and 10% pink periphery noted. Wound WJX:BJYNWG ulcer is pink, moist and granulating.  Evidence of wound healing apparent (patient has been followed by [facility]) everyu other day for dressing changes. Drainage (amount, consistency, odor) Scant amount of light yellow drainage on old dressing at sacrum.  No drainage from incision line or heel on left foot. Periwound:feel are dry and peeling.  Tissue in periwound of sacrum is clean and intact. Dressing procedure/placement/frequency: calcium alginate dressing is indicated for sacral wound to absorb moisture, obliterate dead space and support continually moist wound environment. I have initiated santyl/collagenase ointment to begin enzymatic debridement process to incision line on left foot.  A soft silicone foam dressing is applied to the left heel.  Prevalon boots are provided to redistribute pressure and pad/protect left foot. A low air loss overlay is provided to redistribute pressure while in bed, but does not replace turning and repositioning. I suggest consultation with [facility] to ensure they are aware of patient's admission to [facility] and for reevaluation of left foot incision line and left heel. I will not follow.  Please re-consult if needed. Thanks, Ladona Mow, MSN, RN, [facility], CWOCN [phone])

## 2011-10-12 NOTE — Progress Notes (Signed)
[facility] Progress Note  10/12/2011   Subjective: Pt without complaints, no cough, no cp, no SOB  Objective:  Vital signs in last 24 hours: Filed Vitals:   10/12/11 0000 10/12/11 0400 10/12/11 0500 10/12/11 0600  BP: 116/59 126/66 113/65 126/51  Pulse: 64 70 65   Temp:  97.7 F (36.5 C)    TempSrc:  Oral    Resp: 20 16 13 27   Height:      Weight:   84.5 kg (186 lb 4.6 oz)   SpO2: 95% 98%     Weight change:   Intake/Output Summary (Last 24 hours) at 10/12/11 4098 Last data filed at 10/12/11 0600  Gross per 24 hour  Intake    915 ml  Output      1 ml  Net    914 ml   Lab Results  Component Value Date   HGBA1C  Value: 4.3 (NOTE) The ADA recommends the following therapeutic goal for glycemic control related to Hgb A1c measurement: Goal of therapy: <6.5 Hgb A1c  Reference: American Diabetes Association: Clinical Practice Recommendations 2010, Diabetes Care, 2010, 33: (Suppl  1).* 07/22/2008   Lab Results  Component Value Date   LDLCALC  Value: 100        Total Cholesterol/HDL:CHD Risk Coronary Heart Disease Risk Table                     Men   Women  1/2 Average Risk   3.4   3.3  Average Risk       5.0   4.4  2 X Average Risk   9.6   7.1  3 X Average Risk  23.4   11.0        Use the calculated Patient Ratio above and the CHD Risk Table to determine the patient's CHD Risk.        ATP III CLASSIFICATION (LDL):  <100     mg/dL   Optimal  119-147  mg/dL   Near or Above                    Optimal  130-159  mg/dL   Borderline  829-562  mg/dL   High  >130     mg/dL   Very High* 10/28/5782   CREATININE 5.49* 10/12/2011    Review of Systems As above, otherwise all reviewed and reported negative  Physical Exam General - awake, chronically ill appearing, no distress, cooperative HEENT - NCAT, MMM Lungs - BBS, CTA CV - normal s1, s2 sounds Abd - soft, nondistended, no masses, nontender Ext - wound c/d/i  Lab Results: Results for orders placed during the hospital encounter of  10/11/11 (from the past 24 hour(s))  CBC WITH DIFFERENTIAL     Status: Abnormal   Collection Time   10/11/11 11:50 AM      Component Value Range   WBC 6.4  4.0 - 10.5 K/uL   RBC 2.91 (*) 4.22 - 5.81 MIL/uL   Hemoglobin 8.8 (*) 13.0 - 17.0 g/dL   HCT 69.6 (*) 29.5 - 28.4 %   MCV 97.9  78.0 - 100.0 fL   MCH 30.2  26.0 - 34.0 pg   MCHC 30.9  30.0 - 36.0 g/dL   RDW 13.2 (*) 44.0 - 10.2 %   Platelets 181  150 - 400 K/uL   Neutrophils Relative 73  43 - 77 %   Lymphocytes Relative 19  12 - 46 %   Monocytes Relative  5  3 - 12 %   Eosinophils Relative 3  0 - 5 %   Basophils Relative 0  0 - 1 %   Neutro Abs 4.7  1.7 - 7.7 K/uL   Lymphs Abs 1.2  0.7 - 4.0 K/uL   Monocytes Absolute 0.3  0.1 - 1.0 K/uL   Eosinophils Absolute 0.2  0.0 - 0.7 K/uL   Basophils Absolute 0.0  0.0 - 0.1 K/uL   RBC Morphology BASOPHILIC STIPPLING     WBC Morphology ANISOCYTES    COMPREHENSIVE METABOLIC PANEL     Status: Abnormal   Collection Time   10/11/11 11:50 AM      Component Value Range   Sodium 139  135 - 145 mEq/L   Potassium 3.5  3.5 - 5.1 mEq/L   Chloride 99  96 - 112 mEq/L   CO2 31  19 - 32 mEq/L   Glucose, Bld 97  70 - 99 mg/dL   BUN 32 (*) 6 - 23 mg/dL   Creatinine, Ser 1.61 (*) 0.50 - 1.35 mg/dL   Calcium 9.2  8.4 - 09.6 mg/dL   Total Protein 5.4 (*) 6.0 - 8.3 g/dL   Albumin 1.6 (*) 3.5 - 5.2 g/dL   AST 13  0 - 37 U/L   ALT 6  0 - 53 U/L   Alkaline Phosphatase 109  39 - 117 U/L   Total Bilirubin 0.3  0.3 - 1.2 mg/dL   GFR calc non Af Amer 11 (*) >90 mL/min   GFR calc Af Amer 13 (*) >90 mL/min  LIPASE, BLOOD     Status: Abnormal   Collection Time   10/11/11 11:50 AM      Component Value Range   Lipase 6 (*) 11 - 59 U/L  LACTIC ACID, PLASMA     Status: Normal   Collection Time   10/11/11 11:50 AM      Component Value Range   Lactic Acid, Venous 0.7  0.5 - 2.2 mmol/L  PROCALCITONIN     Status: Normal   Collection Time   10/11/11 11:50 AM      Component Value Range   Procalcitonin 3.82      TROPONIN I     Status: Normal   Collection Time   10/11/11 11:50 AM      Component Value Range   Troponin I <0.30  <0.30 ng/mL  MRSA PCR SCREENING     Status: Abnormal   Collection Time   10/11/11  4:35 PM      Component Value Range   MRSA by PCR POSITIVE (*) NEGATIVE  CBC     Status: Abnormal   Collection Time   10/11/11  6:09 PM      Component Value Range   WBC 5.6  4.0 - 10.5 K/uL   RBC 3.01 (*) 4.22 - 5.81 MIL/uL   Hemoglobin 9.0 (*) 13.0 - 17.0 g/dL   HCT 04.5 (*) 40.9 - 81.1 %   MCV 98.7  78.0 - 100.0 fL   MCH 29.9  26.0 - 34.0 pg   MCHC 30.3  30.0 - 36.0 g/dL   RDW 91.4 (*) 78.2 - 95.6 %   Platelets 185  150 - 400 K/uL  CREATININE, SERUM     Status: Abnormal   Collection Time   10/11/11  6:09 PM      Component Value Range   Creatinine, Ser 5.21 (*) 0.50 - 1.35 mg/dL   GFR calc non Af Amer 11 (*) >90  mL/min   GFR calc Af Amer 12 (*) >90 mL/min  COMPREHENSIVE METABOLIC PANEL     Status: Abnormal   Collection Time   10/12/11  6:02 AM      Component Value Range   Sodium 140  135 - 145 mEq/L   Potassium 4.2  3.5 - 5.1 mEq/L   Chloride 101  96 - 112 mEq/L   CO2 23  19 - 32 mEq/L   Glucose, Bld 69 (*) 70 - 99 mg/dL   BUN 38 (*) 6 - 23 mg/dL   Creatinine, Ser 1.61 (*) 0.50 - 1.35 mg/dL   Calcium 8.7  8.4 - 09.6 mg/dL   Total Protein 5.1 (*) 6.0 - 8.3 g/dL   Albumin 1.5 (*) 3.5 - 5.2 g/dL   AST 23  0 - 37 U/L   ALT 8  0 - 53 U/L   Alkaline Phosphatase 103  39 - 117 U/L   Total Bilirubin 0.2 (*) 0.3 - 1.2 mg/dL   GFR calc non Af Amer 10 (*) >90 mL/min   GFR calc Af Amer 12 (*) >90 mL/min  CBC     Status: Abnormal   Collection Time   10/12/11  6:02 AM      Component Value Range   WBC 5.2  4.0 - 10.5 K/uL   RBC 2.97 (*) 4.22 - 5.81 MIL/uL   Hemoglobin 8.9 (*) 13.0 - 17.0 g/dL   HCT 04.5 (*) 40.9 - 81.1 %   MCV 99.7  78.0 - 100.0 fL   MCH 30.0  26.0 - 34.0 pg   MCHC 30.1  30.0 - 36.0 g/dL   RDW 91.4 (*) 78.2 - 95.6 %   Platelets 174  150 - 400 K/uL    Micro  Results: Recent Results (from the past 240 hour(s))  MRSA PCR SCREENING     Status: Abnormal   Collection Time   10/11/11  4:35 PM      Component Value Range Status Comment   MRSA by PCR POSITIVE (*) NEGATIVE Final     Medications:  Scheduled Meds:   . amLODipine  10 mg Oral Daily  . Chlorhexidine Gluconate Cloth  6 each Topical Q0600  . cinacalcet  60 mg Oral Q breakfast  . ceFEPIme (MAXIPIME) 2 GM IVP      . feeding supplement (NEPRO CARB STEADY)  237 mL Oral BID BM  . gabapentin  200 mg Oral Daily  . heparin  5,000 Units Subcutaneous Q8H  . isosorbide mononitrate  30 mg Oral Daily  . lanthanum  1,000 mg Oral BID WC  . levofloxacin (LEVAQUIN) IV  500 mg Intravenous Q48H  . metoprolol succinate  100 mg Oral BID  . multivitamin  1 tablet Oral Daily  . mupirocin ointment  1 application Nasal BID  . ondansetron  4 mg Oral Once  . pantoprazole  40 mg Oral Q0600  . pantoprazole  40 mg Intravenous Once  . saccharomyces boulardii  250 mg Oral BID  . simvastatin  5 mg Oral QHS  . sodium chloride  3 mL Intravenous Q12H  . white petrolatum      . DISCONTD: antiseptic oral rinse  15 mL Mouth Rinse BID  . DISCONTD: ceFEPime (MAXIPIME) IV  1 g Intravenous Q24H  . DISCONTD: cinacalcet  60 mg Oral Q breakfast  . DISCONTD: lanthanum  1,000 mg Oral BID WC  . DISCONTD: levofloxacin (LEVAQUIN) IV  750 mg Intravenous Q24H  . DISCONTD: levofloxacin (LEVAQUIN) IV  750  mg Intravenous Q48H  . DISCONTD: vancomycin  1,000 mg Intravenous Q24H   Continuous Infusions:   . DISCONTD: sodium chloride 800 mL (10/11/11 1811)   PRN Meds:.sodium chloride, ondansetron (ZOFRAN) IV, ondansetron, sodium chloride, DISCONTD: sodium chloride, DISCONTD: ondansetron  Assessment/Plan: Question of aspiration pneumonia - continue Levaquin IV per pharmacy, no resp distress, transfer to telemetry  Stage 4 sacral decubitus ulcer - air overlay mattress ordered, frequent turns in bed, WOC consult  Peripheral vascular  occlusive disease s/p recent Left transmetatarsal amputation - continue local wound care, follow up with vascular surgery  Nausea and vomiting - no recurrence reported.  Pt eating well.    ESRD on HD - resume HD on Monday, consulted [facility]  Anemia - stable, following  Critical care time spent 35 mins   LOS: 1 day   Ashtynn Berke 10/12/2011, 8:14 AM  Cleora Fleet, MD, CDE, FAAFP [facility] [place], [place]  [phone]

## 2011-10-13 ENCOUNTER — Encounter: Payer: Self-pay | Admitting: Neurosurgery

## 2011-10-13 ENCOUNTER — Ambulatory Visit: Payer: Medicare Other | Admitting: Neurosurgery

## 2011-10-13 ENCOUNTER — Inpatient Hospital Stay (HOSPITAL_COMMUNITY): Payer: Medicare Other

## 2011-10-13 DIAGNOSIS — R112 Nausea with vomiting, unspecified: Secondary | ICD-10-CM

## 2011-10-13 DIAGNOSIS — Z87891 Personal history of nicotine dependence: Secondary | ICD-10-CM

## 2011-10-13 DIAGNOSIS — E785 Hyperlipidemia, unspecified: Secondary | ICD-10-CM

## 2011-10-13 LAB — CBC
HCT: 26.5 % — ABNORMAL LOW (ref 39.0–52.0)
Hemoglobin: 8.3 g/dL — ABNORMAL LOW (ref 13.0–17.0)
MCH: 30 pg (ref 26.0–34.0)
MCH: 30.7 pg (ref 26.0–34.0)
MCHC: 30.1 g/dL (ref 30.0–36.0)
MCHC: 31.3 g/dL (ref 30.0–36.0)
MCV: 99.6 fL (ref 78.0–100.0)
MCV: 98.1 fL (ref 78.0–100.0)
Platelets: 149 10*3/uL — ABNORMAL LOW (ref 150–400)
Platelets: 186 K/uL (ref 150–400)
RBC: 2.73 MIL/uL — ABNORMAL LOW (ref 4.22–5.81)
RBC: 2.7 MIL/uL — ABNORMAL LOW (ref 4.22–5.81)
RDW: 23.7 % — ABNORMAL HIGH (ref 11.5–15.5)
RDW: 23.1 % — ABNORMAL HIGH (ref 11.5–15.5)
WBC: 5.7 K/uL (ref 4.0–10.5)

## 2011-10-13 LAB — RENAL FUNCTION PANEL
Albumin: 1.5 g/dL — ABNORMAL LOW (ref 3.5–5.2)
BUN: 43 mg/dL — ABNORMAL HIGH (ref 6–23)
CO2: 29 meq/L (ref 19–32)
Calcium: 8.1 mg/dL — ABNORMAL LOW (ref 8.4–10.5)
Chloride: 100 meq/L (ref 96–112)
Creatinine, Ser: 6.66 mg/dL — ABNORMAL HIGH (ref 0.50–1.35)
GFR calc Af Amer: 9 mL/min — ABNORMAL LOW
GFR calc non Af Amer: 8 mL/min — ABNORMAL LOW
Glucose, Bld: 82 mg/dL (ref 70–99)
Phosphorus: 5.3 mg/dL — ABNORMAL HIGH (ref 2.3–4.6)
Potassium: 3.9 meq/L (ref 3.5–5.1)
Sodium: 139 meq/L (ref 135–145)

## 2011-10-13 LAB — BASIC METABOLIC PANEL
Calcium: 8.3 mg/dL — ABNORMAL LOW (ref 8.4–10.5)
Creatinine, Ser: 6.39 mg/dL — ABNORMAL HIGH (ref 0.50–1.35)
GFR calc non Af Amer: 8 mL/min — ABNORMAL LOW (ref >90)
Sodium: 138 mEq/L (ref 135–145)

## 2011-10-13 MED ORDER — COLLAGENASE 250 UNIT/GM EX OINT
TOPICAL_OINTMENT | Freq: Every day | CUTANEOUS | Status: DC
  Filled 2011-10-13: qty 30

## 2011-10-13 MED ORDER — SODIUM CHLORIDE 0.9 % IV SOLN: 100.0000 mL | INTRAVENOUS | Status: DC | PRN

## 2011-10-13 MED ORDER — PENTAFLUOROPROP-TETRAFLUOROETH EX AERO: 1.0000 "application " | INHALATION_SPRAY | CUTANEOUS | Status: DC | PRN

## 2011-10-13 MED ORDER — NEPRO/CARBSTEADY PO LIQD: 237.0000 mL | ORAL | Status: DC | PRN

## 2011-10-13 MED ORDER — HEPARIN SODIUM (PORCINE) 1000 UNIT/ML DIALYSIS
1000.0000 [IU] | INTRAMUSCULAR | Status: DC | PRN
  Administered 2011-10-14: 1000 [IU] via INTRAVENOUS_CENTRAL

## 2011-10-13 MED ORDER — LIDOCAINE-PRILOCAINE 2.5-2.5 % EX CREA: 1.0000 "application " | TOPICAL_CREAM | CUTANEOUS | Status: DC | PRN

## 2011-10-13 MED ORDER — ALTEPLASE 2 MG IJ SOLR
2.0000 mg | Freq: Once | INTRAMUSCULAR | Status: AC | PRN
  Filled 2011-10-13: qty 2

## 2011-10-13 MED ORDER — LIDOCAINE HCL (PF) 1 % IJ SOLN: 5.0000 mL | INTRAMUSCULAR | Status: DC | PRN

## 2011-10-13 MED FILL — Saccharomyces boulardii Cap 250 MG: ORAL | Qty: 1 | Status: AC

## 2011-10-13 MED FILL — Metoprolol Succinate Tab ER 24HR 100 MG (Tartrate Equiv): ORAL | Qty: 1 | Status: AC

## 2011-10-13 MED FILL — Heparin Sodium (Porcine) Inj 5000 Unit/ML: INTRAMUSCULAR | Qty: 1 | Status: AC

## 2011-10-13 MED FILL — Simvastatin Tab 5 MG: ORAL | Qty: 1 | Status: AC

## 2011-10-13 NOTE — Progress Notes (Signed)
[facility] Progress Note  10/13/2011   Subjective: Pt says he has been having heartburn.  This has been a longterm problem for him.  He is not sleeping well in hospital.  No other complaints.  N/V has gotten better.    Objective:  Vital signs in last 24 hours: Filed Vitals:   10/12/11 1554 10/12/11 2200 10/13/11 0607 10/13/11 1000  BP: 141/66 93/50 129/59 119/54  Pulse: 73 81 78 71  Temp: 98.3 F (36.8 C) 97.9 F (36.6 C) 98.6 F (37 C) 98.8 F (37.1 C)  TempSrc: Oral Oral Oral Oral  Resp: 17 18 18 18   Height: 5\' 6"  (1.676 m)     Weight:      SpO2: 98% 94% 91% 99%   Weight change:   Intake/Output Summary (Last 24 hours) at 10/13/11 1157 Last data filed at 10/13/11 1000  Gross per 24 hour  Intake    380 ml  Output      0 ml  Net    380 ml   Lab Results  Component Value Date   HGBA1C  Value: 4.3 (NOTE) The ADA recommends the following therapeutic goal for glycemic control related to Hgb A1c measurement: Goal of therapy: <6.5 Hgb A1c  Reference: American Diabetes Association: Clinical Practice Recommendations 2010, Diabetes Care, 2010, 33: (Suppl  1).* 07/22/2008   Lab Results  Component Value Date   LDLCALC  Value: 100        Total Cholesterol/HDL:CHD Risk Coronary Heart Disease Risk Table                     Men   Women  1/2 Average Risk   3.4   3.3  Average Risk       5.0   4.4  2 X Average Risk   9.6   7.1  3 X Average Risk  23.4   11.0        Use the calculated Patient Ratio above and the CHD Risk Table to determine the patient's CHD Risk.        ATP III CLASSIFICATION (LDL):  <100     mg/dL   Optimal  161-096  mg/dL   Near or Above                    Optimal  130-159  mg/dL   Borderline  045-409  mg/dL   High  >811     mg/dL   Very High* 11/22/4780   CREATININE 6.39* 10/13/2011    Review of Systems As above, otherwise all reviewed and reported negative  Physical Exam General - awake, chronically ill appearing, no distress, cooperative  HEENT - NCAT, MMM Lungs -  BBS, CTA  CV - normal s1, s2 sounds  Abd - soft, nondistended, no masses, nontender  Ext - wound c/d/i  Lab Results: Results for orders placed during the hospital encounter of 10/11/11 (from the past 24 hour(s))  CBC     Status: Abnormal   Collection Time   10/13/11  5:50 AM      Component Value Range   WBC 5.2  4.0 - 10.5 K/uL   RBC 2.73 (*) 4.22 - 5.81 MIL/uL   Hemoglobin 8.2 (*) 13.0 - 17.0 g/dL   HCT 95.6 (*) 21.3 - 08.6 %   MCV 99.6  78.0 - 100.0 fL   MCH 30.0  26.0 - 34.0 pg   MCHC 30.1  30.0 - 36.0 g/dL   RDW 57.8 (*) 46.9 -  15.5 %   Platelets 149 (*) 150 - 400 K/uL  BASIC METABOLIC PANEL     Status: Abnormal   Collection Time   10/13/11  5:50 AM      Component Value Range   Sodium 138  135 - 145 mEq/L   Potassium 3.9  3.5 - 5.1 mEq/L   Chloride 99  96 - 112 mEq/L   CO2 23  19 - 32 mEq/L   Glucose, Bld 73  70 - 99 mg/dL   BUN 42 (*) 6 - 23 mg/dL   Creatinine, Ser 1.61 (*) 0.50 - 1.35 mg/dL   Calcium 8.3 (*) 8.4 - 10.5 mg/dL   GFR calc non Af Amer 8 (*) >90 mL/min   GFR calc Af Amer 10 (*) >90 mL/min    Micro Results: Recent Results (from the past 240 hour(s))  MRSA PCR SCREENING     Status: Abnormal   Collection Time   10/11/11  4:35 PM      Component Value Range Status Comment   MRSA by PCR POSITIVE (*) NEGATIVE Final     Medications:  Scheduled Meds:   . amLODipine  10 mg Oral Daily  . Chlorhexidine Gluconate Cloth  6 each Topical Q0600  . feeding supplement (NEPRO CARB STEADY)  237 mL Oral BID BM  . gabapentin  200 mg Oral Daily  . heparin  5,000 Units Subcutaneous Q8H  . isosorbide mononitrate  30 mg Oral Daily  . lanthanum  1,000 mg Oral BID WC  . levofloxacin (LEVAQUIN) IV  500 mg Intravenous Q48H  . metoprolol succinate  100 mg Oral BID  . multivitamin  1 tablet Oral Daily  . mupirocin ointment  1 application Nasal BID  . pantoprazole  40 mg Oral Q0600  . ranitidine  75 mg Oral Once  . saccharomyces boulardii  250 mg Oral BID  . simvastatin  5  mg Oral QHS  . sodium chloride  3 mL Intravenous Q12H  . DISCONTD: cinacalcet  60 mg Oral Q breakfast   Continuous Infusions:  PRN Meds:.sodium chloride, sodium chloride, sodium chloride, alteplase, feeding supplement (NEPRO CARB STEADY), heparin, HYDROcodone-acetaminophen, lidocaine, lidocaine-prilocaine, morphine injection, ondansetron (ZOFRAN) IV, ondansetron, pentafluoroprop-tetrafluoroeth, sodium chloride  Assessment/Plan: Pneumonia - continue Levaquin IV per pharmacy, no resp distress, transfer to telemetry   Stage 4 sacral decubitus ulcer - air overlay mattress ordered, frequent turns in bed, WOC consult   Peripheral vascular occlusive disease s/p recent Left transmetatarsal amputation - continue local wound care, follow up with vascular surgery, follow up with wound care clinic after discharge   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 Pt eating.   ESRD on HD - resume HD on Monday, consulted [facility]   Anemia - stable, following  Dispo: Plan dc home tomorrow if stable   LOS: 2 days   Kemia Wendel 10/13/2011, 11:57 AM  Cleora Fleet, MD, CDE, FAAFP [facility] [place], [place]  [phone]

## 2011-10-13 NOTE — Evaluation (Signed)
Occupational Therapy Evaluation Patient Details Name: [name] MRN: [id number] DOB: 31-Mar-1950 Today's Date: 10/13/2011 Time: 4098-1191 OT Time Calculation (min): 40 min  OT Assessment / Plan / Recommendation Clinical Impression  Pt admitted with vomiting and possible aspiration.n Pt has a history of severe peripheral vascular disease, long tobacco use history as well end stage renal disease with hemodialysis.He was recently discharged home 08/23/2011 with CDiff colitis sepsis.  Pt is generally min assist for ADL and mobility.  Anticipate he will be able to return home upon d/c.     OT Assessment  Patient needs continued OT Services    Follow Up Recommendations  Supervision - Intermittent    Barriers to Discharge      Equipment Recommendations  None recommended by OT    Recommendations for Other Services    Frequency  Min 2X/week    Precautions / Restrictions Precautions Precautions: Fall Precaution Comments: Sacral wound Restrictions Weight Bearing Restrictions: No LLE Weight Bearing: Non weight bearing   Pertinent Vitals/Pain     ADL  Eating/Feeding: Independent;Performed;Other (comment) (ice chips with spoon) Where Assessed - Eating/Feeding: Edge of bed Grooming: Performed;Wash/dry face;Wash/dry hands;Set up;Other (comment) (dentures are at home) Where Assessed - Grooming: Unsupported sitting Upper Body Bathing: Simulated;Set up Where Assessed - Upper Body Bathing: Unsupported sitting Lower Body Bathing: Simulated;Minimal assistance Where Assessed - Lower Body Bathing: Supported sit to stand Upper Body Dressing: Simulated;Set up Where Assessed - Upper Body Dressing: Unsupported sitting Lower Body Dressing: Performed;Minimal assistance Where Assessed - Lower Body Dressing: Supported sit to stand Toilet Transfer: Simulated;Minimal assistance Toilet Transfer Method: Stand pivot Equipment Used: Gait belt Transfers/Ambulation Related to ADLs: Pt only able to do  stand pivot to his w/c at home since amputation. (per MD order) ADL Comments: Pt has availability of family as needed for ADL.    OT Diagnosis: Generalized weakness  OT Problem List: Decreased strength;Decreased activity tolerance;Impaired UE functional use;Impaired balance (sitting and/or standing) OT Treatment Interventions: Self-care/ADL training;Balance training;Patient/family education   OT Goals Acute Rehab OT Goals OT Goal Formulation: With patient Time For Goal Achievement: 10/27/11 Potential to Achieve Goals: Good ADL Goals Pt Will Perform Lower Body Bathing: with supervision;Sit to stand from bed;Sitting, edge of bed ADL Goal: Lower Body Bathing - Progress: Goal set today Pt Will Perform Lower Body Dressing: with supervision;Sitting, bed;Sit to stand from bed ADL Goal: Lower Body Dressing - Progress: Goal set today Pt Will Transfer to Toilet: with supervision;3-in-1;Maintaining weight bearing status ADL Goal: Toilet Transfer - Progress: Goal set today Pt Will Perform Toileting - Clothing Manipulation: with modified independence;Sitting on 3-in-1 or toilet ADL Goal: Toileting - Clothing Manipulation - Progress: Goal set today Pt Will Perform Toileting - Hygiene: Independently;Sitting on 3-in-1 or toilet ADL Goal: Toileting - Hygiene - Progress: Goal set today Miscellaneous OT Goals Miscellaneous OT Goal #1: Pt will perform bed mobility with modified independence in prep for ADL. OT Goal: Miscellaneous Goal #1 - Progress: Goal set today  Visit Information  Last OT Received On: 10/13/11 Assistance Needed: +1    Subjective Data  Subjective: "Doing fine." Patient Stated Goal: Home as soon as possible.   Prior Functioning  Vision/Perception  Home Living Lives With: Son Available Help at Discharge: Family Type of Home: House Home Access: Level entry Home Layout: One level Bathroom Shower/Tub: Tub/shower unit;Door (doesn't use shower) Bathroom Toilet: Standard Bathroom  Accessibility: Yes How Accessible: Accessible via walker;Accessible via wheelchair Home Adaptive Equipment: Bedside commode/3-in-1;Shower chair with back;Straight cane;Walker - rolling;Wheelchair - manual  Additional Comments: pt's home is level entry and accessible.  Reports he may live in sisters mobile home (3 STE with bilateral rails) for assistance Prior Function Level of Independence: Needs assistance Needs Assistance: Light Housekeeping;Meal Prep;Transfers Meal Prep: Maximal Light Housekeeping: Maximal Transfer Assistance: min sometimes Able to Take Stairs?: No Driving: No Vocation: On disability Communication Communication: No difficulties Dominant Hand: Right      Cognition  Overall Cognitive Status: Appears within functional limits for tasks assessed/performed Arousal/Alertness: Awake/alert Orientation Level: Appears intact for tasks assessed Behavior During Session: [facility] for tasks performed    Extremity/Trunk Assessment Right Upper Extremity Assessment RUE ROM/Strength/Tone: [facility] for tasks assessed Left Upper Extremity Assessment LUE ROM/Strength/Tone: Deficits;Due to pain (90 degrees FF with crepitus due to arthritis per pt.) Left Lower Extremity Assessment LLE ROM/Strength/Tone Deficits: NWB, amputation of forefoot Trunk Assessment Trunk Assessment: Normal   Mobility Bed Mobility Bed Mobility: Rolling Left;Left Sidelying to Sit;Sitting - Scoot to Edge of Bed Rolling Left: With rail;4: Min guard Left Sidelying to Sit: 4: Min assist;With rails Sitting - Scoot to Edge of Bed: 4: Min guard Details for Bed Mobility Assistance: Cues for using rail.  Assist to lift trunk from bed Transfers Sit to Stand: 4: Min assist;With upper extremity assist Stand to Sit: 4: Min guard;With upper extremity assist;With armrests;To chair/3-in-1   Exercise    Balance Balance Balance Assessed: Yes Static Sitting Balance Static Sitting - Balance Support: No upper extremity supported;Feet  supported Static Sitting - Level of Assistance: 6: Modified independent (Device/Increase time) Static Sitting - Comment/# of Minutes: 7 Dynamic Sitting Balance Dynamic Sitting - Balance Support: Feet supported;No upper extremity supported Dynamic Sitting - Level of Assistance: 6: Modified independent (Device/Increase time)  End of Session OT - End of Session Equipment Utilized During Treatment: Gait belt Activity Tolerance: Patient tolerated treatment well Patient left: in bed;Other (comment);with call bell/phone within reach (MD in room) Nurse Communication: Other (comment) (swallowing complaints)  GO     Evern Bio 10/13/2011, 12:45 PM [phone]

## 2011-10-13 NOTE — Consult Note (Signed)
Referring Provider: No ref. provider found Primary Care Physician:  Earl Deiters, MD Primary Nephrologist:  Dr. Gillian [name]  Reason for Consultation:  Medical management of ESRD  [name] is an 61 y.o. male with with history of severe peripheral vascular disease, long tobacco use history as well end stage renal disease with hemodialysis.He was recently discharged home 08/23/2011 with CDiff colitis sepsis.His family brought him to [facility] ED saturday because pt has had symptoms of nausea and vomiting and bilious emesis on several occasions in the last 3 days.      Past Medical History  Diagnosis Date  . Hyperlipidemia   . ESRD (end stage renal disease)     a. MWF dialysis in [place] (followed by Dr. Kristian [name])  . Arthritis   . Claudication   . Stroke 2005  . Anemia   . Chronic diastolic CHF (congestive heart failure) 07/2010    a. 07/2008 Echo EF 50-55%, mild-mod LVH, Gr 1 DD.  Marland Kitchen Peripheral vascular occlusive disease     a. 07/2011 Periph Angio: No signif Ao-illiac dzs, LCF stenosis, 100% LSFA w recon above knee pop and 3 vessel runoff, 100% RSFA w/ recon above knee --> pending L Fem to below Knee pop bypass.  Marland Kitchen History of tobacco abuse   . Hypertension     takes Amlodipine,Metoprolol,and Prinivil daily  . Dyspnea on exertion     with exertion    Past Surgical History  Procedure Date  . Arteriovenous graft placement   . Av fistula placement 12/10/2000    Right brachiocephalic arteriovenous   . Hernia repair   . Aortagram 07/29/2011    Abdominal Aortagram  . Cardiac catheterization 08/01/11    Left heart catheterization  . Femoral-popliteal bypass graft 09/09/2011    Procedure: BYPASS GRAFT FEMORAL-POPLITEAL ARTERY;  Surgeon: Earl Libman, MD;  Location: [facility] OR;  Service: Vascular;  Laterality: Left;  . Amputation 09/12/2011    Procedure: AMPUTATION BELOW KNEE;  Surgeon: Earl Libman, MD;  Location: [facility] OR;  Service: Vascular;  Laterality: Left;  TRANSMETATARSAL     Prior to Admission medications   Medication Sig Start Date End Date Taking? Authorizing Provider  amLODipine (NORVASC) 10 MG tablet Take 10 mg by mouth daily.   Yes Historical Provider, MD  cinacalcet (SENSIPAR) 30 MG tablet Take 2 tablets (60 mg total) by mouth daily with breakfast. 09/16/11 12/15/11 Yes Earl Mage, PA  gabapentin (NEURONTIN) 100 MG capsule Take 2 capsules (200 mg total) by mouth daily. 09/26/11  Yes Earl Kanner Love, PA  isosorbide mononitrate (IMDUR) 30 MG 24 hr tablet Take 30 mg by mouth daily.   Yes Historical Provider, MD  lanthanum (FOSRENOL) 1000 MG chewable tablet Chew 1,000 mg by mouth 2 (two) times daily with a meal.   Yes Historical Provider, MD  multivitamin (RENA-VIT) TABS tablet Take 1 tablet by mouth daily.   Yes Historical Provider, MD  metoprolol succinate (TOPROL-XL) 100 MG 24 hr tablet Take 100 mg by mouth 2 (two) times daily. Take with or immediately following a meal.    Historical Provider, MD  Nutritional Supplements (FEEDING SUPPLEMENT, NEPRO CARB STEADY,) LIQD Take 237 mLs by mouth 2 (two) times daily between meals. 09/16/11   Earl Mage, PA  simvastatin (ZOCOR) 5 MG tablet Take 5 mg by mouth at bedtime.    Historical Provider, MD    Current Facility-Administered Medications  Medication Dose Route Frequency Provider Last Rate Last Dose  . 0.9 %  sodium chloride infusion  250 mL Intravenous PRN Earl L Johnson, MD      . amLODipine (NORVASC) tablet 10 mg  10 mg Oral Daily Earl Cyndie Mull, MD   10 mg at 10/12/11 1030  . Chlorhexidine Gluconate Cloth 2 % PADS 6 each  6 each Topical Q0600 Earl Lipa, NP   6 each at 10/13/11 [phone]  . cinacalcet (SENSIPAR) tablet 60 mg  60 mg Oral Q breakfast Earl Cyndie Mull, MD   60 mg at 10/13/11 0949  . feeding supplement (NEPRO CARB STEADY) liquid 237 mL  237 mL Oral BID BM Earl L Johnson, MD   237 mL at 10/12/11 1427  . gabapentin (NEURONTIN) capsule 200 mg  200 mg Oral Daily Earl L  Johnson, MD   200 mg at 10/12/11 1030  . heparin injection 5,000 Units  5,000 Units Subcutaneous Q8H Earl Cyndie Mull, MD   5,000 Units at 10/13/11 [phone]  . HYDROcodone-acetaminophen (NORCO/VICODIN) 5-325 MG per tablet 1 tablet  1 tablet Oral Q4H PRN Earl Cyndie Mull, MD   1 tablet at 10/13/11 0641  . isosorbide mononitrate (IMDUR) 24 hr tablet 30 mg  30 mg Oral Daily Earl Cyndie Mull, MD   30 mg at 10/12/11 1031  . lanthanum (FOSRENOL) chewable tablet 1,000 mg  1,000 mg Oral BID WC Earl Cyndie Mull, MD   1,000 mg at 10/12/11 0851  . levofloxacin (LEVAQUIN) IVPB 500 mg  500 mg Intravenous Q48H [name], PHARMD      . metoprolol succinate (TOPROL-XL) 24 hr tablet 100 mg  100 mg Oral BID Earl Cyndie Mull, MD   100 mg at 10/12/11 1031  . morphine 2 MG/ML injection 0.5 mg  0.5 mg Intravenous Q2H PRN Earl Cyndie Mull, MD      . multivitamin (RENA-VIT) tablet 1 tablet  1 tablet Oral Daily Earl Cyndie Mull, MD   1 tablet at 10/12/11 1031  . mupirocin ointment (BACTROBAN) 2 % 1 application  1 application Nasal BID Earl Lipa, NP   1 application at 10/12/11 2145  . ondansetron (ZOFRAN) tablet 4 mg  4 mg Oral Q6H PRN Earl Cyndie Mull, MD       Or  . ondansetron (ZOFRAN) injection 4 mg  4 mg Intravenous Q6H PRN Earl L Johnson, MD      . pantoprazole (PROTONIX) EC tablet 40 mg  40 mg Oral Q0600 Earl Cyndie Mull, MD   40 mg at 10/13/11 9629  . ranitidine (ZANTAC) 150 MG/10ML syrup 75 mg  75 mg Oral Once Earl Cyndie Mull, MD   75 mg at 10/12/11 1518  . saccharomyces boulardii (FLORASTOR) capsule 250 mg  250 mg Oral BID Earl Cyndie Mull, MD   250 mg at 10/12/11 2145  . simvastatin (ZOCOR) tablet 5 mg  5 mg Oral QHS Earl Cyndie Mull, MD   5 mg at 10/12/11 2145  . sodium chloride 0.9 % injection 3 mL  3 mL Intravenous Q12H Earl L Johnson, MD   3 mL at 10/12/11 1030  . sodium chloride 0.9 % injection 3 mL  3 mL Intravenous PRN Earl Cyndie Mull, MD      . DISCONTD:  HYDROcodone-acetaminophen (NORCO/VICODIN) 5-325 MG per tablet 1-2 tablet  1-2 tablet Oral Q4H PRN Earl Cyndie Mull, MD        Allergies as of 10/11/2011  . (No Known Allergies)    Family History  Problem Relation Age of Onset  . Breast cancer Mother   . Cancer Father   .  Anesthesia problems Neg Hx   . Hypotension Neg Hx   . Malignant hyperthermia Neg Hx   . Pseudochol deficiency Neg Hx     History   Social History  . Marital Status: Legally Separated    Spouse Name: N/A    Number of Children: N/A  . Years of Education: N/A   Occupational History  . DISABLED    Social History Main Topics  . Smoking status: Former Smoker -- 1.0 packs/day for 40 years    Types: Cigarettes    Quit date: 03/25/2011  . Smokeless tobacco: Not on file  . Alcohol Use: No  . Drug Use: No  . Sexually Active: Not Currently   Other Topics Concern  . Not on file   Social History Narrative   Lives in [place].  His cousin helps him out and drives him to appts. He quit smoking earlier this year.    Review of Systems: Gen: weakness  HEENT: No visual complaints, No history of Retinopathy. Normal external appearance No Epistaxis or Sore throat. No sinusitis.   CV: Denies chest pain, angina, palpitations, syncope, orthopnea, PND, peripheral edema, and claudication. Resp: Denies dyspnea at rest, dyspnea with exercise, cough, sputum, wheezing, coughing up blood, and pleurisy. WU:JWJXBJ and vomiting  GU : Denies urinary burning, blood in urine, urinary frequency, urinary hesitancy, nocturnal urination, and urinary incontinence.  No renal calculi. MS: Denies joint pain, limitation of movement, and swelling, stiffness, low back pain, extremity pain. Denies muscle weakness, cramps, atrophy.  No use of non steroidal antiinflammatory drugs. Derm: sacral decubitus  Psych: Denies depression, anxiety, memory loss, suicidal ideation, hallucinations, paranoia, and confusion. Heme: Denies bruising, bleeding, and  enlarged lymph nodes. Neuro: No headache.  No diplopia. No dysarthria.  No dysphasia.  No history of CVA.  No Seizures. No paresthesias.  Endocrine No DM.  No Thyroid disease.  No Adrenal disease.  Physical Exam: Vital signs in last 24 hours: Temp:  [97.1 F (36.2 C)-98.8 F (37.1 C)] 98.8 F (37.1 C) (07/22 1000) Pulse Rate:  [71-81] 71  (07/22 1000) Resp:  [17-18] 18  (07/22 1000) BP: (93-141)/(50-66) 119/54 mmHg (07/22 1000) SpO2:  [91 %-99 %] 99 % (07/22 1000) Last BM Date: 10/11/11 General:  Chronically Ill Head:  Normocephalic and atraumatic. Eyes:  Sclera clear, no icterus.   Conjunctiva pink. Ears:  Normal auditory acuity. Nose:  No deformity, discharge,  or lesions. Mouth:  No deformity or lesions, edentulous Neck:  Supple; no masses or thyromegaly. JVP not elevated Lungs:  Clear throughout to auscultation.   No wheezes, crackles, or rhonchi. No acute distress. Heart: Irregular Abdomen:  Soft, nontender and nondistended. No masses, hepatosplenomegaly or hernias noted. Normal bowel sounds, without guarding, and without rebound.   Msk:  Symmetrical without gross deformities. Normal posture. Pulses:  No carotid, renal, femoral bruits. DP and PT symmetrical and equal Extremities:  Without clubbing or edema. Neurologic:  Alert and  oriented x4;  grossly normal neurologically. Skin:  Intact without significant lesions or rashes. Cervical Nodes:  No significant cervical adenopathy. Psych:  Alert and cooperative. Normal mood and affect.  Intake/Output from previous day: 07/21 0701 - 07/22 0700 In: 780 [P.O.:540; I.V.:240] Out: 0  Intake/Output this shift: Total I/O In: 120 [P.O.:120] Out: -   Lab Results:  Basename 10/13/11 0550 10/12/11 0602 10/11/11 1809  WBC 5.2 5.2 5.6  HGB 8.2* 8.9* 9.0*  HCT 27.2* 29.6* 29.7*  PLT 149* 174 185   BMET  Basename 10/13/11 0550 10/12/11  0602 10/11/11 1809 10/11/11 1150  NA 138 140 -- 139  K 3.9 4.2 -- 3.5  CL 99 101 -- 99    CO2 23 23 -- 31  GLUCOSE 73 69* -- 97  BUN 42* 38* -- 32*  CREATININE 6.39* 5.49* 5.21* --  CALCIUM 8.3* 8.7 -- 9.2  PHOS -- -- -- --   LFT  Basename 10/12/11 0602  PROT 5.1*  ALBUMIN 1.5*  AST 23  ALT 8  ALKPHOS 103  BILITOT 0.2*  BILIDIR --  IBILI --   PT/INR No results found for this basename: LABPROT:2,INR:2 in the last 72 hours Hepatitis Panel No results found for this basename: HEPBSAG,HCVAB,HEPAIGM,HEPBIGM in the last 72 hours  Studies/Results: Portable Chest 1 View  10/12/2011  *RADIOLOGY REPORT*  Clinical Data: SOB, evaluate for pneumonia  PORTABLE CHEST - 1 VIEW  Comparison: 10/11/2011  Findings: Cardiomegaly with mild interstitial edema and moderate right pleural effusion.  Suspected small left pleural effusion.  Associated right lower lobe opacity, likely atelectasis, underlying pneumonia not excluded.  No pneumothorax.  Stable left IJ venous catheter.  IMPRESSION: Cardiomegaly with mild interstitial edema and moderate right pleural effusion.  Suspected small left pleural effusion.  Associate right lower lobe opacity, likely atelectasis, underlying pneumonia not excluded.  Original Report Authenticated By: Charline Bills, M.D.   Dg Abd Acute W/chest  10/11/2011  *RADIOLOGY REPORT*  Clinical Data: Abdominal pain, nausea and vomiting.  ACUTE ABDOMEN SERIES (ABDOMEN 2 VIEW & CHEST 1 VIEW)  Comparison: Abdominal radiographs 08/28/2011.  Chest x-ray 09/10/2011.  Findings: Left internal jugular PermCath with the tips terminating in the right atrium and at the superior cavoatrial junction is unchanged.  Lung volumes are low.  Linear opacities in the left mid and lower lung are most consistent with atelectasis.  Patchy airspace consolidation and volume loss in the right middle and lower lobes.  Trace left and small right-sided pleural effusions. No pneumoperitoneum.  Supine and upright views of the abdomen demonstrate gas and stool scattered throughout the colon extending to the  level of the distal rectum.  No pathologic distension of small bowel is noted.  Midline surgical staples are again noted.  Numerous vascular calcifications.  In addition, there are numerous egg-shell type calcifications in the left upper quadrant of the abdomen which appear to correspond with numerous calcified cystic lesions based on comparison with prior CT scans.  IMPRESSION: 1.  Nonobstructive bowel gas pattern. 2.  No pneumoperitoneum. 3.  Support apparatus and postoperative changes, as above. 4.  Air space consolidation and atelectasis in the right middle and lower lobes, concerning for pneumonia and/or sequelae of aspiration. 5.  Small right and trace left-sided pleural effusions. 6.  Atherosclerosis.  Original Report Authenticated By: Florencia Reasons, M.D.    Assessment/Plan:  ESRD- MWF dialysis  ANEMIA-give ESA consider transfusion  MBD-poor nutrition low albumin and high corrected calcium. Stop cinacalcet with GI upset  HTN/VOL-stable  Left transmetatarsal amputation  Sacral decubitus  Pneumonia treated with Levoquin   LOS: 2 Clista Rainford W @TODAY @10 :22 AM

## 2011-10-13 NOTE — Progress Notes (Signed)
INITIAL ADULT NUTRITION ASSESSMENT Date: 10/13/2011   Time: 10:01 AM Reason for Assessment: Nutrition Risk  ASSESSMENT: Male 61 y.o.  Dx: Question aspiration PNA; ESRD on HD; peripheral vascular occlusive disease  Past Medical History  Diagnosis Date  . Hyperlipidemia   . ESRD (end stage renal disease)     a. MWF dialysis in [place] (followed by Dr. Kristian Covey)  . Arthritis   . Claudication   . Stroke 2005  . Anemia   . Chronic diastolic CHF (congestive heart failure) 07/2010    a. 07/2008 Echo EF 50-55%, mild-mod LVH, Gr 1 DD.  Marland Kitchen Peripheral vascular occlusive disease     a. 07/2011 Periph Angio: No signif Ao-illiac dzs, LCF stenosis, 100% LSFA w recon above knee pop and 3 vessel runoff, 100% RSFA w/ recon above knee --> pending L Fem to below Knee pop bypass.  Marland Kitchen History of tobacco abuse   . Hypertension     takes Amlodipine,Metoprolol,and Prinivil daily  . Dyspnea on exertion     with exertion    Scheduled Meds:   . amLODipine  10 mg Oral Daily  . Chlorhexidine Gluconate Cloth  6 each Topical Q0600  . cinacalcet  60 mg Oral Q breakfast  . feeding supplement (NEPRO CARB STEADY)  237 mL Oral BID BM  . gabapentin  200 mg Oral Daily  . heparin  5,000 Units Subcutaneous Q8H  . isosorbide mononitrate  30 mg Oral Daily  . lanthanum  1,000 mg Oral BID WC  . levofloxacin (LEVAQUIN) IV  500 mg Intravenous Q48H  . metoprolol succinate  100 mg Oral BID  . multivitamin  1 tablet Oral Daily  . mupirocin ointment  1 application Nasal BID  . pantoprazole  40 mg Oral Q0600  . ranitidine  75 mg Oral Once  . saccharomyces boulardii  250 mg Oral BID  . simvastatin  5 mg Oral QHS  . sodium chloride  3 mL Intravenous Q12H   Continuous Infusions:  PRN Meds:.sodium chloride, HYDROcodone-acetaminophen, morphine injection, ondansetron (ZOFRAN) IV, ondansetron, sodium chloride, DISCONTD: HYDROcodone-acetaminophen   Ht: 5\' 6"  (167.6 cm)  Wt: 186 lb 4.6 oz (84.5 kg)  Ideal Wt: 64.5 kg  %  Ideal Wt: 131%  Usual Wt: ~130 lb post-dialysis earlier this month; pt reports UBW as ~156 lb % Usual Wt: 143%  Body mass index is 30.07 kg/(m^2).  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 Reported nausea and vomiting for 3 days PTA. Pt states that it feels like food is getting stuck in esophagus when he tries to eat and not "going down." Pt reports drinking the Nepro that is ordered.    Labs:  CMP     Component Value Date/Time   NA 138 10/13/2011 0550   K 3.9 10/13/2011 0550   CL 99 10/13/2011 0550   CO2 23 10/13/2011 0550   GLUCOSE 73 10/13/2011 0550   BUN 42* 10/13/2011 0550   CREATININE 6.39* 10/13/2011 0550   CALCIUM 8.3* 10/13/2011 0550   PROT 5.1* 10/12/2011 0602   ALBUMIN 1.5* 10/12/2011 0602   AST 23 10/12/2011 0602   ALT 8 10/12/2011 0602   ALKPHOS 103 10/12/2011 0602   BILITOT 0.2* 10/12/2011 0602   GFRNONAA 8* 10/13/2011 0550   GFRAA 10* 10/13/2011 0550    Intake/Output Summary (Last 24 hours) at 10/13/11 1005 Last data filed at 10/13/11 0617  Gross per 24 hour  Intake  460 ml  Output      0 ml  Net    460 ml    Diet Order: Renal (55% PO intake documented)  Supplements/Tube Feeding: Nepro BID  IVF:    Estimated Nutritional Needs:   Kcal: 2100-2350 kcal Protein: 95-105 gm Fluid: 1200 mL fluid restriction   NUTRITION DIAGNOSIS: -Increased nutrient needs (NI-5.1).  Status: Ongoing  RELATED TO: wound healing  AS EVIDENCE BY: stage IV sacrum pressure ulcer and unstageable left heel pressure ulcer  MONITORING/EVALUATION(Goals): Goal: Pt to consume >75% meals and supplements in order to promote wound healing. Monitor: PO intake, weight, labs, wounds  EDUCATION NEEDS: -No education needs identified at this time  INTERVENTION: Encourage PO intake at meals. Continue to provide Nepro BID.    DOCUMENTATION CODES Per approved criteria  -Not Applicable    Leonette Most 10/13/2011, 10:01 AM

## 2011-10-13 NOTE — Procedures (Signed)
I have seen and examined this patient and agree with the plan of care . Seen on dialysis.  Julius Boniface W 10/13/2011, 4:38 PM

## 2011-10-13 NOTE — Progress Notes (Signed)
   CARE MANAGEMENT NOTE 10/13/2011  Patient:  [name], [name]   Account Number:  [id number]  Date Initiated:  10/13/2011  Documentation initiated by:  Darlyne Russian  Subjective/Objective Assessment:   Patient admitted with aspiration pneumonia     Action/Plan:   Progression of care and discharge planning   Anticipated DC Date:     Anticipated DC Plan:  HOME W HOME HEALTH SERVICES      DC Planning Services  CM consult      Choice offered to / List presented to:             Status of service:  In process, will continue to follow Medicare Important Message given?   (If response is "NO", the following Medicare IM given date fields will be blank) Date Medicare IM given:   Date Additional Medicare IM given:    Discharge Disposition:    Per UR Regulation:  Reviewed for med. necessity/level of care/duration of stay  If discussed at Long Length of Stay Meetings, dates discussed:    Comments:  10/13/2011  [address] RN, [place]  [phone] Met with patient regarding discharge planning. Prior to admission living at home, with assistance from family. He is active with [facility] for RN, PT and aide services. [facility]/Marie: called to advise of admission.

## 2011-10-14 ENCOUNTER — Ambulatory Visit: Payer: Medicare Other | Admitting: Neurosurgery

## 2011-10-14 ENCOUNTER — Inpatient Hospital Stay (HOSPITAL_COMMUNITY): Payer: Medicare Other

## 2011-10-14 DIAGNOSIS — G4700 Insomnia, unspecified: Secondary | ICD-10-CM

## 2011-10-14 DIAGNOSIS — Z992 Dependence on renal dialysis: Secondary | ICD-10-CM

## 2011-10-14 DIAGNOSIS — I10 Essential (primary) hypertension: Secondary | ICD-10-CM

## 2011-10-14 DIAGNOSIS — L8990 Pressure ulcer of unspecified site, unspecified stage: Secondary | ICD-10-CM

## 2011-10-14 DIAGNOSIS — I739 Peripheral vascular disease, unspecified: Secondary | ICD-10-CM

## 2011-10-14 DIAGNOSIS — L89109 Pressure ulcer of unspecified part of back, unspecified stage: Secondary | ICD-10-CM

## 2011-10-14 MED ORDER — HYDROCODONE-ACETAMINOPHEN 5-325 MG PO TABS: 1.0000 | ORAL_TABLET | ORAL | Status: DC | PRN

## 2011-10-14 MED ORDER — SODIUM CHLORIDE 0.9 % IJ SOLN
10.0000 mL | Freq: Two times a day (BID) | INTRAMUSCULAR | Status: DC
Start: 2011-10-14 — End: 2011-10-14
  Administered 2011-10-14: 10 mL

## 2011-10-14 MED ORDER — PANTOPRAZOLE SODIUM 40 MG PO TBEC: 40.0000 mg | DELAYED_RELEASE_TABLET | Freq: Every day | ORAL | Status: DC

## 2011-10-14 MED ORDER — COLLAGENASE 250 UNIT/GM EX OINT
TOPICAL_OINTMENT | Freq: Two times a day (BID) | CUTANEOUS | Status: DC
  Administered 2011-10-14: 06:00:00 via TOPICAL

## 2011-10-14 MED ORDER — ACIDOPHILUS PROBIOTIC 100 MG PO CAPS: 1.0000 | ORAL_CAPSULE | Freq: Three times a day (TID) | ORAL | Status: DC

## 2011-10-14 MED ORDER — SODIUM CHLORIDE 0.9 % IJ SOLN: 10.0000 mL | INTRAMUSCULAR | Status: DC | PRN

## 2011-10-14 MED ORDER — LEVOFLOXACIN 500 MG PO TABS: 500.0000 mg | ORAL_TABLET | ORAL | Status: DC

## 2011-10-14 MED ORDER — HEPARIN (PORCINE) LOCK FLUSH 10 UNIT/ML IV SOLN: 20.0000 [IU] | INTRAVENOUS | Status: DC | PRN

## 2011-10-14 NOTE — Progress Notes (Signed)
   CARE MANAGEMENT NOTE 10/14/2011  Patient:  [name], [name]   Account Number:  [id number]  Date Initiated:  10/13/2011  Documentation initiated by:  Darlyne Russian  Subjective/Objective Assessment:   Patient admitted with aspiration pneumonia     Action/Plan:   Progression of care and discharge planning   Anticipated DC Date:     Anticipated DC Plan:  HOME W HOME HEALTH SERVICES      DC Planning Services  CM consult      Choice offered to / List presented to:             Status of service:  In process, will continue to follow Medicare Important Message given?   (If response is "NO", the following Medicare IM given date fields will be blank) Date Medicare IM given:   Date Additional Medicare IM given:    Discharge Disposition:    Per UR Regulation:  Reviewed for med. necessity/level of care/duration of stay  If discussed at Long Length of Stay Meetings, dates discussed:    Comments:  10/14/2011  [address] RN, [place] [phone] Met with patient regarding hospital bed and mattress overlay at home. He does not have a hospital bed or mattresss overlay. He does not want a hospital bed, he has a twin bed. [facility]/Justin: left message regarding referral for mattress overlay.  10/13/2011  [address] RN, [place]  [phone] Met with patient regarding discharge planning. Prior to admission living at home, with assistance from family. He is active with [facility] for RN, PT and aide services. [facility]: called to advise of admission.

## 2011-10-14 NOTE — Progress Notes (Signed)
Occupational Therapy Treatment Patient Details Name: [name] MRN: [id number] DOB: [birth year] Today's Date: 10/14/2011 Time: 1545-1610 OT Time Calculation (min): 25 min  OT Assessment / Plan / Recommendation Comments on Treatment Session Pt making steady progress but still needs consistent mod facilitation for squat pivot transfers to the 3:1 and sof toileting tasks sit to stand.  Feel he needs continued practice with HHOT and 24 hour assist from family.    Follow Up Recommendations  Supervision - Intermittent;Home health OT       Equipment Recommendations  3 in 1 bedside comode       Frequency Min 2X/week   Plan Discharge plan needs to be updated    Precautions / Restrictions Precautions Precautions: Fall Precaution Comments: Sacral wound Restrictions Weight Bearing Restrictions: Yes LLE Weight Bearing: Non weight bearing   Pertinent Vitals/Pain Pt reports pain 10/10 in the LLE but does not reflect his reaction.  Repositioned the LLE at end of session.  He reports just having pain medication prior to session.    ADL  Toilet Transfer: Performed;Moderate assistance Toilet Transfer Method: Squat pivot Toileting - Clothing Manipulation and Hygiene: Simulated;Moderate assistance Transfers/Ambulation Related to ADLs: Pt requires mod facilitation to transfer squat pivot from bed to 3:1 and back for toileting.   ADL Comments: Pt needs mod facilitation for squat pivot transfer from bed to 3:1, with max instructional cueing for technique and maintaining NWBing over the LLE.  Pt reports that his son can help him with this at home.  In standing pt able to maintain NWBing but needs constant mod facilitation.  Feel he could utilize a walker to help with standing for clothing management for toileting tasks.  Only tolerated standing for approximately 30 seconds before needing to sit.         OT Goals ADL Goals ADL Goal: Toilet Transfer - Progress: Progressing toward goals ADL Goal:  Toileting - Clothing Manipulation - Progress: Progressing toward goals Miscellaneous OT Goals OT Goal: Miscellaneous Goal #1 - Progress: Progressing toward goals  Visit Information  Last OT Received On: 10/14/11 Assistance Needed: +1    Subjective Data  Subjective: "I'm supposed to go home today." Patient Stated Goal: Did not state but agreeable to participating in OT session.      Cognition  Overall Cognitive Status: Appears within functional limits for tasks assessed/performed Arousal/Alertness: Awake/alert Orientation Level: Appears intact for tasks assessed Behavior During Session: [facility] for tasks performed    Mobility Bed Mobility Bed Mobility: Supine to Sit Rolling Left: With rail;4: Min guard Left Sidelying to Sit: 4: Min assist;With rails Supine to Sit: HOB flat;5: Supervision Sitting - Scoot to Edge of Bed: 5: Supervision Details for Bed Mobility Assistance: cues for use of rail and manual assist to bing trunk to sitting Transfers Sit to Stand: 3: Mod assist;Without upper extremity assist;From bed;From chair/3-in-1 Stand to Sit: 3: Mod assist;With armrests;To chair/3-in-1 Details for Transfer Assistance: pt. needed cues and manual assist today to maintain NWB on L LE      Balance Balance Balance Assessed: Yes Static Sitting Balance Static Sitting - Balance Support: No upper extremity supported;Feet supported Static Sitting - Level of Assistance: 6: Modified independent (Device/Increase time) Static Standing Balance Static Standing - Balance Support: Right upper extremity supported;Left upper extremity supported Static Standing - Level of Assistance: 3: Mod assist Dynamic Standing Balance Dynamic Standing - Balance Support: Right upper extremity supported;Left upper extremity supported Dynamic Standing - Level of Assistance: 3: Mod assist  End of Session OT -  End of Session Activity Tolerance: Patient limited by fatigue Patient left: in bed;with call bell/phone  within reach     [facility] OTR/L Pager number [phone] 10/14/2011, 4:27 PM

## 2011-10-14 NOTE — Progress Notes (Signed)
Physical Therapy Treatment Patient Details Name: [name] MRN: [id number] DOB: [birth year] Today's Date: 10/14/2011 Time: 4098-1191 PT Time Calculation (min): 18 min  PT Assessment / Plan / Recommendation Comments on Treatment Session  Pt. needed assist to maintain NWB status today.  Hopeful to see better performance next session in prep for DC home.    Follow Up Recommendations  Home health PT;Supervision - Intermittent    Barriers to Discharge        Equipment Recommendations  None recommended by PT    Recommendations for Other Services    Frequency Min 3X/week   Plan Discharge plan remains appropriate    Precautions / Restrictions Precautions Precautions: Fall Precaution Comments: Sacral wound Restrictions Weight Bearing Restrictions: Yes LLE Weight Bearing: Non weight bearing   Pertinent Vitals/Pain No distress    Mobility  Bed Mobility Bed Mobility: Rolling Left;Left Sidelying to Sit;Sitting - Scoot to Edge of Bed Rolling Left: With rail;4: Min guard Left Sidelying to Sit: 4: Min assist;With rails Sitting - Scoot to [place] of Bed: 4: Min guard Details for Bed Mobility Assistance: cues for use of rail and manual assist to bing trunk to sitting Transfers Transfers: Sit to Stand;Stand to [employer] Transfers Sit to Stand: 4: Min assist;With upper extremity assist Stand to Sit: 4: Min guard;With upper extremity assist;With armrests;To chair/3-in-1 Stand Pivot Transfers: 3: Mod assist Details for Transfer Assistance: pt. needed cues and manual assist today to maintain NWB on L LE Ambulation/Gait Ambulation/Gait Assistance: Not tested (comment) Stairs: No Wheelchair Mobility Wheelchair Mobility: No    Exercises     PT Diagnosis:    PT Problem List:   PT Treatment Interventions:     PT Goals Acute Rehab PT Goals PT Goal: Supine/Side to Sit - Progress: Progressing toward goal PT Goal: Sit to Stand - Progress: Progressing toward goal PT Goal: Stand to  Sit - Progress: Progressing toward goal PT Transfer Goal: Bed to Chair/Chair to Bed - Progress: Progressing toward goal  Visit Information  Last PT Received On: 10/14/11 Assistance Needed: +1    Subjective Data      Cognition  Overall Cognitive Status: Appears within functional limits for tasks assessed/performed Arousal/Alertness: Awake/alert Orientation Level: Appears intact for tasks assessed Behavior During Session: [facility] for tasks performed    Balance  Static Sitting Balance Static Sitting - Balance Support: No upper extremity supported;Feet supported Static Sitting - Level of Assistance: 6: Modified independent (Device/Increase time)  End of Session PT - End of Session Equipment Utilized During Treatment: Gait belt Activity Tolerance: Patient tolerated treatment well Patient left: in chair;with call bell/phone within reach Nurse Communication: Mobility status   GP     Ferman Hamming 10/14/2011, 3:36 PM Acute Rehabilitation Services [phone] (pager)

## 2011-10-14 NOTE — Discharge Summary (Signed)
Physician Discharge Summary  Patient ID: [name] MRN: [id number] DOB/AGE: [birth year] 61 y.o.  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 male with with history of severe peripheral vascular disease, long tobacco use history as well end stage renal disease with hemodialysis. He had recently been hospitalized for severe sepsis due to C. difficile complicated by respiratory failure and sepsis. He was recently discharged home 08/23/2011. He presented with ischemic left foot on 09/08/2011. He has been followed by vascular surgery with plan for femoropopliteal bypass which he had 09/09/2011 per Dr. Myra Gianotti. He had ongoing ischemic changes to left lower extremity and ultimately underwent left transmetatarsal amputation 09/12/2011 per Dr. Myra Gianotti. He was sent to [facility] rehab and was recently discharge on 07/03 to home with family. His family brought him to [facility] ED today because pt has had symptoms of nausea and vomiting and bilious emesis on several occasions in the last 3 days. Also he has had a reported mild nonproductive cough. Pt reports that he has been vomiting because he has been eating to excess. He has been swelling more recently. He had his full hemodialysis treatment yesterday. Pt says that he has been sleeping during the day  because he stays up all night watching television. He suffers from longterm chronic insomnia. He was seen in ED and his CXR was concerning for possible aspiration pneumonia. He was accepted for transfer to [facility] step down unit for further treatment. He was started on broad spectrum antibiotics and admission was requested. The patient however states that he feels fine and that he would like to get out to the hospital as soon as possible.  Pneumonia - continue Levaquin every 48 hours x 3, no resp distress, probiotics on board  Stage 4 sacral decubitus ulcer - air overlay mattress ordered, hospital bed ordered for home use,  frequent turns in bed    Peripheral vascular occlusive disease s/p recent Left transmetatarsal amputation - continue local wound care, follow up with vascular surgery, follow up with wound care clinic after discharge   History of recalcitrant c. Diff - probiotics orally bid   GERD - cont protonix daily   Nausea and vomiting - no recurrence reported. Pt eating.  Recommend pt have an outpatient GI consultation arranged through his PCP.    ESRD on HD - resume HD tomorrow, consulted [name], had HD on 7/22.    Anemia - stable, following   Dispo: Plan dc home tomorrow with home RN, PT, wound care, aide  Consults: Nephrology  Significant Diagnostic Studies:   Treatments: Hemodialysis  Discharge Exam: Pt without complaints, no SOB, no CP, no fever or chills, no problems eating today, no nausea or vomiting.   Blood pressure 128/62, pulse 65, temperature 98 F (36.7 C), temperature source Oral, resp. rate 18, height 5\' 6"  (1.676 m), weight 59.1 kg (130 lb 4.7 oz), SpO2 96.00%. General -  awake, chronically ill appearing, no distress, cooperative  HEENT - NCAT, MMM Lungs - BBS, CTA  CV - normal s1, s2 sounds  Abd - soft, nondistended, no masses, nontender  Ext - wound c/d/i   Disposition: 06-Home-Health Care Svc  Discharge Orders    Future Appointments:  Provider: Department: Dept Phone: Center:   10/21/2011 10:40 AM Earl Oyster, MD [facility] [phone] [facility]   10/28/2011 10:00 AM [facility]  [phone] [facility]   10/28/2011 10:30 AM Earl Earthly, MD [facility] [phone] [facility]   11/11/2011 8:00 AM [facility] [phone] [facility]     Future Orders Please Complete By Expires   DG Esophagus  10/16/11 12/14/12   Scheduling Instructions:   Please call patient with appointment.  He has hemodialysis on M, W, F   Questions: Responses:   Preferred imaging location? [facility]   Reason for exam: dysphagia   Increase activity slowly      Discharge instructions      Comments:   Return if symptoms return, worsen or new problems develop.     Medication List  As of 10/14/2011  4:46 PM   TAKE these medications         ACIDOPHILUS PROBIOTIC 100 MG Caps   Take 1 capsule (100 mg total) by mouth 3 (three) times daily with meals.      amLODipine 10 MG tablet   Commonly known as: NORVASC   Take 10 mg by mouth daily.      cinacalcet 30 MG tablet   Commonly known as: SENSIPAR   Take 2 tablets (60 mg total) by mouth daily with breakfast.      feeding supplement (NEPRO CARB STEADY) Liqd   Take 237 mLs by mouth 2 (two) times daily between meals.      gabapentin 100 MG capsule   Commonly known as: NEURONTIN   Take 2 capsules (200 mg total) by mouth daily.      HYDROcodone-acetaminophen 5-325 MG per tablet   Commonly known as: NORCO/VICODIN   Take 1 tablet by mouth every 4 (four) hours as needed.      isosorbide mononitrate 30 MG 24 hr tablet   Commonly known as: IMDUR   Take 30 mg by mouth daily.      lanthanum 1000 MG chewable tablet   Commonly known as: FOSRENOL   Chew 1,000 mg by mouth 2 (two) times daily with a meal.      levofloxacin 500 MG tablet   Commonly known as: LEVAQUIN   Take 1 tablet (500 mg total) by mouth every other day.   Start taking on: 10/15/2011       metoprolol succinate 100 MG 24 hr tablet   Commonly known as: TOPROL-XL   Take 100 mg by mouth 2 (two) times daily. Take with or immediately following a meal.      multivitamin Tabs tablet   Take 1 tablet by mouth daily.      pantoprazole 40 MG tablet   Commonly known as: PROTONIX   Take 1 tablet (40 mg total) by mouth daily at 6 (six) AM.      simvastatin 5 MG tablet   Commonly known as: ZOCOR   Take 5 mg by mouth at bedtime.           Follow-up Information    Follow up with HASANAJ,XAJE A, MD. Schedule an appointment as soon as possible for a visit in 1 week. [facility] Follow up)  Contact information:   [address] [place], [place] [address] [phone]      Follow up with Hemodialysis MWF in 1 day.      Follow up with Physician Gastroenterology, MD. Schedule an appointment as soon as possible for a visit in 2 weeks. (Call [facility] [phone]  [address] [place], [place] [address])    Contact information:   [address] [place], [place] [address] [phone]          38 mins spent preparing discharge, reviewing records, reconciling meds, etc  Signed: Standley Dakins 10/14/2011, 4:46 PM [facility]

## 2011-10-14 NOTE — Evaluation (Signed)
Clinical/Bedside Swallow Evaluation Patient Details  Name: [name] MRN: [id number] Date of Birth: 07-12-1950  Today's Date: 10/14/2011 Time: 0865-7846 SLP Time Calculation (min): 22 min  Past Medical History:  Past Medical History  Diagnosis Date  . Hyperlipidemia   . ESRD (end stage renal disease)     a. MWF dialysis in [place] (followed by Dr. Kristian Covey)  . Arthritis   . Claudication   . Stroke 2005  . Anemia   . Chronic diastolic CHF (congestive heart failure) 07/2010    a. 07/2008 Echo EF 50-55%, mild-mod LVH, Gr 1 DD.  Marland Kitchen Peripheral vascular occlusive disease     a. 07/2011 Periph Angio: No signif Ao-illiac dzs, LCF stenosis, 100% LSFA w recon above knee pop and 3 vessel runoff, 100% RSFA w/ recon above knee --> pending L Fem to below Knee pop bypass.  Marland Kitchen History of tobacco abuse   . Hypertension     takes Amlodipine,Metoprolol,and Prinivil daily  . Dyspnea on exertion     with exertion   Past Surgical History:  Past Surgical History  Procedure Date  . Arteriovenous graft placement   . Av fistula placement 12/10/2000    Right brachiocephalic arteriovenous   . Hernia repair   . Aortagram 07/29/2011    Abdominal Aortagram  . Cardiac catheterization 08/01/11    Left heart catheterization  . Femoral-popliteal bypass graft 09/09/2011    Procedure: BYPASS GRAFT FEMORAL-POPLITEAL ARTERY;  Surgeon: [name], MD;  Location: [facility] OR;  Service: Vascular;  Laterality: Left;  . Amputation 09/12/2011    Procedure: AMPUTATION BELOW KNEE;  Surgeon: [name], MD;  Location: [facility] OR;  Service: Vascular;  Laterality: Left;  TRANSMETATARSAL   HPI:  History of Present Illness:[name] is an 61 y.o. male with with history of severe peripheral vascular disease, long tobacco use history  as well end stage renal disease with hemodialysis. He had recently been hospitalized for severe sepsis due to C. difficile complicated by respiratory failure and sepsis. He was recently  discharged home 08/23/2011. He presented with ischemic left foot on 09/08/2011. He has been followed by vascular surgery with plan for femoropopliteal bypass which he had 09/09/2011 per Dr. Myra Gianotti.  He had ongoing ischemic changes to left lower extremity and ultimately underwent left transmetatarsal amputation 09/12/2011 per Dr. Myra Gianotti. He was sent to [facility] rehab and was recently discharge on 07/03 to home with family.   His family brought him to [facility] ED today because pt has had symptoms of nausea and vomiting and bilious emesis on several occasions in the last 3 days.  Also he has had a reported mild nonproductive cough.  Pt reports that he has been vomiting because he has been eating to excess.  He has been swelling more recently.   Assessment / Plan / Recommendation Clinical Impression  Patient describes symptoms most consistent with an esophageal dysphagia, complaining of solids "sticking" and points to his upper esophagus/sternal area.  Patient reports this is what caused him to have bouts of vomiting, which led this hospital admission.  Oropharyngeal swallow function appears within normal limits, with no overt s/s of aspiration.  Patient may benefit from further assessment of the esohagus, either via esophagram, or GI consult for EGD.    Aspiration Risk  Moderate    Diet Recommendation Dysphagia 3 (Mechanical Soft);Thin liquid   Liquid Administration via: Straw Medication Administration: Whole meds with liquid (May need to crush, if large) Supervision: Patient able to self feed Compensations:  Slow rate;Small sips/bites;Follow solids with liquid Postural Changes and/or Swallow Maneuvers: Seated upright 90 degrees;Upright 30-60 min after meal    Other  Recommendations Recommended Consults: Consider GI evaluation;Consider esophageal assessment Oral Care Recommendations: Oral care QID;Patient independent with oral care Other Recommendations: Clarify dietary restrictions (Renal  restrictions)   Follow Up Recommendations  None    Frequency and Duration   n/a     Pertinent Vitals/Pain n/a    SLP Swallow Goals Patient will consume recommended diet without observed clinical signs of aspiration with: Modified independent assistance   Swallow Study Prior Functional Status       General HPI: History of Present Illness:[name] is an 61 y.o. male with with history of severe peripheral vascular disease, long tobacco use history  as well end stage renal disease with hemodialysis. He had recently been hospitalized for severe sepsis due to C. difficile complicated by respiratory failure and sepsis. He was recently discharged home 08/23/2011. He presented with ischemic left foot on 09/08/2011. He has been followed by vascular surgery with plan for femoropopliteal bypass which he had 09/09/2011 per Dr. Myra Gianotti.  He had ongoing ischemic changes to left lower extremity and ultimately underwent left transmetatarsal amputation 09/12/2011 per Dr. Myra Gianotti. He was sent to [facility] rehab and was recently discharge on 07/03 to home with family.   His family brought him to [facility] ED today because pt has had symptoms of nausea and vomiting and bilious emesis on several occasions in the last 3 days.  Also he has had a reported mild nonproductive cough.  Pt reports that he has been vomiting because he has been eating to excess.  He has been swelling more recently. Type of Study: Bedside swallow evaluation Diet Prior to this Study: Regular;Other (Comment) (Renal diet) Temperature Spikes Noted: No Respiratory Status: Room air Behavior/Cognition: Alert;Cooperative;Pleasant mood Self-Feeding Abilities: Able to feed self Patient Positioning: Upright in chair Baseline Vocal Quality: Clear Volitional Cough: Strong Volitional Swallow: Able to elicit    Oral/Motor/Sensory Function Overall Oral Motor/Sensory Function: Appears within functional limits for tasks assessed   Ice Chips       Thin Liquid Thin Liquid: Within functional limits (No overt s/s but patient reports he has to swallow twice.) Presentation: Spoon;Cup;Straw    Nectar Thick Nectar Thick Liquid: Not tested   Honey Thick Honey Thick Liquid: Not tested   Puree Puree: Within functional limits (complains of food "sticking" (upper esophagus)) Presentation: Spoon   Solid Solid: Within functional limits Presentation: Self Fed Other Comments: "I have to chew for a long time, and it sticks."    Maryjo Rochester T 10/14/2011,12:24 PM

## 2011-10-14 NOTE — Progress Notes (Signed)
[facility] ROUNDING NOTE   Subjective:   Interval History: none  Objective:  Vital signs in last 24 hours:  Temp:  [97.5 F (36.4 C)-99.3 F (37.4 C)] 98 F (36.7 C) (07/23 0954) Pulse Rate:  [64-80] 66  (07/23 0954) Resp:  [14-19] 18  (07/23 0954) BP: (92-163)/(59-86) 134/67 mmHg (07/23 0954) SpO2:  [92 %-99 %] 94 % (07/23 0954) Weight:  [59.1 kg (130 lb 4.7 oz)-62 kg (136 lb 11 oz)] 59.1 kg (130 lb 4.7 oz) (07/22 1745)  Weight change:  Filed Weights   10/12/11 0500 10/13/11 1338 10/13/11 1745  Weight: 84.5 kg (186 lb 4.6 oz) 62 kg (136 lb 11 oz) 59.1 kg (130 lb 4.7 oz)    Intake/Output: I/O last 3 completed shifts: In: 700 [P.O.:600; IV Piggyback:100] Out: 3000 [Other:3000]   Intake/Output this shift:  Total I/O In: 120 [P.O.:120] Out: -   CVS- RRR RS- CTA ABD- BS present soft non-distended EXT- no edema   Basic Metabolic Panel:  Lab 10/13/11 6045 10/13/11 0550 10/12/11 0602 10/11/11 1809 10/11/11 1150  NA 139 138 140 -- 139  K 3.9 3.9 4.2 -- 3.5  CL 100 99 101 -- 99  CO2 29 23 23  -- 31  GLUCOSE 82 73 69* -- 97  BUN 43* 42* 38* -- 32*  CREATININE 6.66* 6.39* 5.49* 5.21* 5.10*  CALCIUM 8.1* 8.3* 8.7 -- --  MG -- -- -- -- --  PHOS 5.3* -- -- -- --    Liver Function Tests:  Lab 10/13/11 1352 10/12/11 0602 10/11/11 1150  AST -- 23 13  ALT -- 8 6  ALKPHOS -- 103 109  BILITOT -- 0.2* 0.3  PROT -- 5.1* 5.4*  ALBUMIN 1.5* 1.5* 1.6*    Lab 10/11/11 1150  LIPASE 6*  AMYLASE --   No results found for this basename: AMMONIA:3 in the last 168 hours  CBC:  Lab 10/13/11 1352 10/13/11 0550 10/12/11 0602 10/11/11 1809 10/11/11 1150  WBC 5.7 5.2 5.2 5.6 6.4  NEUTROABS -- -- -- -- 4.7  HGB 8.3* 8.2* 8.9* 9.0* 8.8*  HCT 26.5* 27.2* 29.6* 29.7* 28.5*  MCV 98.1 99.6 99.7 98.7 97.9  PLT 186 149* 174 185 181    Cardiac Enzymes:  Lab 10/11/11 1150  CKTOTAL --  CKMB --  CKMBINDEX --  TROPONINI <0.30    BNP: No components found with  this basename: POCBNP:5  CBG: No results found for this basename: GLUCAP:5 in the last 168 hours  Microbiology: Results for orders placed during the hospital encounter of 10/11/11  MRSA PCR SCREENING     Status: Abnormal   Collection Time   10/11/11  4:35 PM      Component Value Range Status Comment   MRSA by PCR POSITIVE (*) NEGATIVE Final     Coagulation Studies: No results found for this basename: LABPROT:5,INR:5 in the last 72 hours  Urinalysis: No results found for this basename: COLORURINE:2,APPERANCEUR:2,LABSPEC:2,PHURINE:2,GLUCOSEU:2,HGBUR:2,BILIRUBINUR:2,KETONESUR:2,PROTEINUR:2,UROBILINOGEN:2,NITRITE:2,LEUKOCYTESUR:2 in the last 72 hours    Imaging: No results found.   Medications:        . amLODipine  10 mg Oral Daily  . Chlorhexidine Gluconate Cloth  6 each Topical Q0600  . collagenase   Topical BID  . feeding supplement (NEPRO CARB STEADY)  237 mL Oral BID BM  . gabapentin  200 mg Oral Daily  . heparin  5,000 Units Subcutaneous Q8H  . isosorbide mononitrate  30 mg Oral Daily  . lanthanum  1,000 mg Oral BID WC  .  levofloxacin (LEVAQUIN) IV  500 mg Intravenous Q48H  . metoprolol succinate  100 mg Oral BID  . multivitamin  1 tablet Oral Daily  . mupirocin ointment  1 application Nasal BID  . pantoprazole  40 mg Oral Q0600  . saccharomyces boulardii  250 mg Oral BID  . simvastatin  5 mg Oral QHS  . sodium chloride  3 mL Intravenous Q12H  . DISCONTD: cinacalcet  60 mg Oral Q breakfast  . DISCONTD: collagenase   Topical Daily   sodium chloride, sodium chloride, sodium chloride, alteplase, feeding supplement (NEPRO CARB STEADY), heparin, HYDROcodone-acetaminophen, lidocaine, lidocaine-prilocaine, morphine injection, ondansetron (ZOFRAN) IV, ondansetron, pentafluoroprop-tetrafluoroeth, sodium chloride  Assessment/ Plan:    [name] is an 61 y.o. male with with history of severe peripheral vascular disease, long tobacco use history as well end stage  renal disease with hemodialysis.   ESRD- MWF dialysis  ANEMIA-give ESA consider transfusion  MBD-poor nutrition low albumin and high corrected calcium. Stop cinacalcet with GI upset  HTN/VOL-stable  Left transmetatarsal amputation  Sacral decubitus  Pneumonia treated with Levoquin  Stable plan dialysis in am   LOS: 3 Soley Harriss W @TODAY @10 :24 AM

## 2011-10-17 ENCOUNTER — Emergency Department (HOSPITAL_COMMUNITY)
Admission: EM | Admit: 2011-10-17 | Discharge: 2011-10-17 | Disposition: A | Discharge status: Home / Self Care | Payer: Medicare Other | Attending: Emergency Medicine | Admitting: Emergency Medicine

## 2011-10-17 ENCOUNTER — Encounter (HOSPITAL_COMMUNITY): Payer: Self-pay | Admitting: *Deleted

## 2011-10-17 ENCOUNTER — Emergency Department (HOSPITAL_COMMUNITY): Payer: Medicare Other

## 2011-10-17 DIAGNOSIS — L89109 Pressure ulcer of unspecified part of back, unspecified stage: Secondary | ICD-10-CM | POA: Insufficient documentation

## 2011-10-17 DIAGNOSIS — I5032 Chronic diastolic (congestive) heart failure: Secondary | ICD-10-CM | POA: Insufficient documentation

## 2011-10-17 DIAGNOSIS — T8189XA Other complications of procedures, not elsewhere classified, initial encounter: Secondary | ICD-10-CM

## 2011-10-17 DIAGNOSIS — L89159 Pressure ulcer of sacral region, unspecified stage: Secondary | ICD-10-CM

## 2011-10-17 DIAGNOSIS — L8990 Pressure ulcer of unspecified site, unspecified stage: Secondary | ICD-10-CM | POA: Insufficient documentation

## 2011-10-17 DIAGNOSIS — Z992 Dependence on renal dialysis: Secondary | ICD-10-CM | POA: Insufficient documentation

## 2011-10-17 DIAGNOSIS — I509 Heart failure, unspecified: Secondary | ICD-10-CM | POA: Insufficient documentation

## 2011-10-17 DIAGNOSIS — Z79899 Other long term (current) drug therapy: Secondary | ICD-10-CM | POA: Insufficient documentation

## 2011-10-17 DIAGNOSIS — N186 End stage renal disease: Secondary | ICD-10-CM | POA: Insufficient documentation

## 2011-10-17 DIAGNOSIS — Z87891 Personal history of nicotine dependence: Secondary | ICD-10-CM | POA: Insufficient documentation

## 2011-10-17 DIAGNOSIS — L89309 Pressure ulcer of unspecified buttock, unspecified stage: Secondary | ICD-10-CM | POA: Insufficient documentation

## 2011-10-17 DIAGNOSIS — I120 Hypertensive chronic kidney disease with stage 5 chronic kidney disease or end stage renal disease: Secondary | ICD-10-CM | POA: Insufficient documentation

## 2011-10-17 DIAGNOSIS — N189 Chronic kidney disease, unspecified: Secondary | ICD-10-CM

## 2011-10-17 LAB — CBC WITH DIFFERENTIAL/PLATELET
Basophils Absolute: 0 10*3/uL (ref 0.0–0.1)
Eosinophils Absolute: 0.1 10*3/uL (ref 0.0–0.7)
Lymphocytes Relative: 18 % (ref 12–46)
MCH: 30.3 pg (ref 26.0–34.0)
MCHC: 30.2 g/dL (ref 30.0–36.0)
Monocytes Absolute: 0.6 10*3/uL (ref 0.1–1.0)
Neutro Abs: 5 10*3/uL (ref 1.7–7.7)
Neutrophils Relative %: 73 % (ref 43–77)
RDW: 21.8 % — ABNORMAL HIGH (ref 11.5–15.5)

## 2011-10-17 NOTE — ED Notes (Signed)
Pt was sent over from the wound care center. Pt has foot wound that is not healing and draining

## 2011-10-17 NOTE — Progress Notes (Signed)
[facility]  NAME:  [name], [name] NO.:  [id number]  MEDICAL RECORD NO.:  [id number]      DATE OF BIRTH:  10/06/1950  PHYSICIAN:  Ardath Sax, M.D.           VISIT DATE:                                  OFFICE VISIT   [name] is a 61 year old gentleman who has many health problems including diabetes and hypertension and renal failure.  He is on dialysis 3 times a week.  He had a recent femoral-popliteal bypass graft on the left side, which failed, and he ended up having a transmetatarsal amputation.  Today, I saw him and whole wound is broken down and all infected with necrotic material with bone poking through.  He also has a sacral decubitus that he developed in the hospital when he had the amputation.  This is very complicated because of his renal failure, diabetes, and all of these prior problems with the wounds and the sacrum, so I felt it would be best to send him back to the hospital for the surgeons to cleaned up these wounds.  I think he is probably going to need a below-knee amputation.  I do not think this transmetatarsal wound can clean up, and of course his renal failure is a problem.  He is on dialysis, so we will go from there.  I will send him to the hospital for their input.     Ardath Sax, M.D.     PP/MEDQ  D:  10/17/2011  T:  10/17/2011  Job:  [id number]

## 2011-10-17 NOTE — ED Provider Notes (Signed)
History     CSN: [id number]  Arrival date & time 10/17/11  1730   First MD Initiated Contact with Patient 10/17/11 1851      Chief Complaint  Patient presents with  . Wound Infection    (Consider location/radiation/quality/duration/timing/severity/associated sxs/prior treatment) The history is provided by the patient and medical records.   the patient has chronic renal failure.  He gets dialysis Monday, Wednesday, Friday.  He had dialysis today.  He also has a history of a partial foot amputation on the left-hand side.  He was at the wound clinic today, and they sent him here for evaluation of his decubitus and nonhealing wound on his left foot.  The patient denies fevers, chills, or diaphoresis.  He does have pain in his left foot and on his buttocks.  He, says that he is bed ridden.  Past Medical History  Diagnosis Date  . Hyperlipidemia   . ESRD (end stage renal disease)     a. MWF dialysis in [place] (followed by Dr. Kristian Covey)  . Arthritis   . Claudication   . Stroke 2005  . Anemia   . Chronic diastolic CHF (congestive heart failure) 07/2010    a. 07/2008 Echo EF 50-55%, mild-mod LVH, Gr 1 DD.  Marland Kitchen Peripheral vascular occlusive disease     a. 07/2011 Periph Angio: No signif Ao-illiac dzs, LCF stenosis, 100% LSFA w recon above knee pop and 3 vessel runoff, 100% RSFA w/ recon above knee --> pending L Fem to below Knee pop bypass.  Marland Kitchen History of tobacco abuse   . Hypertension     takes Amlodipine,Metoprolol,and Prinivil daily  . Dyspnea on exertion     with exertion    Past Surgical History  Procedure Date  . Arteriovenous graft placement   . Av fistula placement 12/10/2000    Right brachiocephalic arteriovenous   . Hernia repair   . Aortagram 07/29/2011    Abdominal Aortagram  . Cardiac catheterization 08/01/11    Left heart catheterization  . Femoral-popliteal bypass graft 09/09/2011    Procedure: BYPASS GRAFT FEMORAL-POPLITEAL ARTERY;  Surgeon: [name], MD;   Location: [facility] OR;  Service: Vascular;  Laterality: Left;  . Amputation 09/12/2011    Procedure: AMPUTATION BELOW KNEE;  Surgeon: [name], MD;  Location: [facility] OR;  Service: Vascular;  Laterality: Left;  TRANSMETATARSAL    Family History  Problem Relation Age of Onset  . Breast cancer Mother   . Cancer Father   . Anesthesia problems Neg Hx   . Hypotension Neg Hx   . Malignant hyperthermia Neg Hx   . Pseudochol deficiency Neg Hx     History  Substance Use Topics  . Smoking status: Former Smoker -- 1.0 packs/day for 40 years    Types: Cigarettes    Quit date: 03/25/2011  . Smokeless tobacco: Not on file  . Alcohol Use: No      Review of Systems  Constitutional: Negative for fever and chills.  Gastrointestinal: Negative for nausea and vomiting.  Musculoskeletal:       Left foot pain  Skin:       Wounds on left foot and buttock  Neurological: Negative for headaches.  Psychiatric/Behavioral: Negative for confusion.  All other systems reviewed and are negative.    Allergies  Review of patient's allergies indicates no known allergies.  Home Medications   Current Outpatient Rx  Name Route Sig Dispense Refill  . AMLODIPINE BESYLATE 10 MG PO TABS Oral Take 10 mg by  mouth daily.    Marland Kitchen CINACALCET HCL 30 MG PO TABS Oral Take 2 tablets (60 mg total) by mouth daily with breakfast. 60 tablet   . GABAPENTIN 100 MG PO CAPS Oral Take 2 capsules (200 mg total) by mouth daily. 60 capsule 1  . HYDROCODONE-ACETAMINOPHEN 5-325 MG PO TABS Oral Take 1 tablet by mouth every 4 (four) hours as needed. 20 tablet 0  . ISOSORBIDE MONONITRATE ER 30 MG PO TB24 Oral Take 30 mg by mouth daily.    . ACIDOPHILUS PROBIOTIC 100 MG PO CAPS Oral Take 1 capsule (100 mg total) by mouth 3 (three) times daily with meals. 90 capsule 0  . LANTHANUM CARBONATE 1000 MG PO CHEW Oral Chew 1,000 mg by mouth 2 (two) times daily with a meal.    . LEVOFLOXACIN 500 MG PO TABS Oral Take 1 tablet (500 mg total) by mouth  every other day. 3 tablet 0  . METOPROLOL SUCCINATE ER 100 MG PO TB24 Oral Take 100 mg by mouth 2 (two) times daily. Take with or immediately following a meal.    . RENA-VITE PO TABS Oral Take 1 tablet by mouth daily.    Marland Kitchen NEPRO/CARB STEADY PO LIQD Oral Take 237 mLs by mouth 2 (two) times daily between meals.    Marland Kitchen PANTOPRAZOLE SODIUM 40 MG PO TBEC Oral Take 1 tablet (40 mg total) by mouth daily at 6 (six) AM. 30 tablet 0  . SIMVASTATIN 5 MG PO TABS Oral Take 5 mg by mouth at bedtime.      BP 99/57  Pulse 83  Temp 97.9 F (36.6 C) (Oral)  Resp 14  SpO2 99%  Physical Exam  Nursing note and vitals reviewed. Constitutional: He is oriented to person, place, and time. No distress.  HENT:  Head: Normocephalic and atraumatic.  Eyes: Conjunctivae are normal.  Neck: Normal range of motion. Neck supple.  Cardiovascular: Normal rate.   Pulmonary/Chest: Effort normal and breath sounds normal.  Abdominal: Soft. There is no tenderness.  Musculoskeletal:       Amputation of left foot, with nonhealing wound, but no signs of acute infection.  No tenderness or redness, or temperature change  Palpable thrill and pulsation in right upper extremity AV fistula  Neurological: He is alert and oriented to person, place, and time.  Skin: Skin is warm and dry.       Decubitus ulcer on buttocks, with no signs of acute infection  Psychiatric: He has a normal mood and affect. Thought content normal.    ED Course  Procedures (including critical care time) chronic, nonhealing wounds in 61 year old, male, with chronic renal failure, and history of partial amputation of his left foot.  There are no signs of acute infection.  We will do an x-ray of his foot and a blood tests for evaluation and I do not think he is septic or there is an indication for admission.  At this time   Labs Reviewed  CBC WITH DIFFERENTIAL   No results found.   No diagnosis found.    MDM  Chronic renal failure Sacral decubitus  ulcer Left foot, chronic, nonhealing wound without signs of acute infection        Cheri Guppy, MD 10/17/11 2031

## 2011-10-21 ENCOUNTER — Encounter: Payer: Medicare Other | Attending: Physical Medicine & Rehabilitation | Admitting: Physical Medicine & Rehabilitation

## 2011-10-22 ENCOUNTER — Telehealth: Payer: Self-pay

## 2011-10-22 ENCOUNTER — Ambulatory Visit: Payer: Medicare Other | Admitting: Neurosurgery

## 2011-10-22 ENCOUNTER — Encounter (INDEPENDENT_AMBULATORY_CARE_PROVIDER_SITE_OTHER): Payer: Medicare Other | Admitting: *Deleted

## 2011-10-22 ENCOUNTER — Encounter: Payer: Self-pay | Admitting: Neurosurgery

## 2011-10-22 ENCOUNTER — Ambulatory Visit (INDEPENDENT_AMBULATORY_CARE_PROVIDER_SITE_OTHER): Payer: Medicare Other | Admitting: Neurosurgery

## 2011-10-22 VITALS — BP 145/52 | HR 56 | Resp 16 | Ht 66.0 in | Wt 130.0 lb

## 2011-10-22 DIAGNOSIS — T8189XA Other complications of procedures, not elsewhere classified, initial encounter: Secondary | ICD-10-CM | POA: Insufficient documentation

## 2011-10-22 DIAGNOSIS — I739 Peripheral vascular disease, unspecified: Secondary | ICD-10-CM

## 2011-10-22 DIAGNOSIS — Z48812 Encounter for surgical aftercare following surgery on the circulatory system: Secondary | ICD-10-CM

## 2011-10-22 DIAGNOSIS — N186 End stage renal disease: Secondary | ICD-10-CM

## 2011-10-22 DIAGNOSIS — I70269 Atherosclerosis of native arteries of extremities with gangrene, unspecified extremity: Secondary | ICD-10-CM | POA: Insufficient documentation

## 2011-10-22 MED ORDER — OXYCODONE-ACETAMINOPHEN 5-325 MG PO TABS: 1.0000 | ORAL_TABLET | Freq: Four times a day (QID) | ORAL | Status: DC | PRN

## 2011-10-22 NOTE — Progress Notes (Signed)
Subjective:     Patient ID: [name], male   DOB: 1950/12/16, 61 y.o.   MRN: [id number]  HPI: 61 year old patient of Dr. Myra [name] who is status post a left lower extremity femoropopliteal bypass in early June and a subsequent left transmetatarsal amputation on 09/12/2011. The patient's been following up with Dr. Jimmey [name] at the wound center and at his last appointment there was referred to the emergency room for possible infection and gangrenous tissue. The patient did go to the [facility] emergency room and was subsequently sent home. The patient's niece called today asking for a reevaluation do to pain and necrotic tissue around the wound.   Review of Systems: 12 point review of systems is notable for the difficulties described above otherwise unremarkable     Objective:   Physical Exam: Afebrile, vital signs are stable, there is an eschar and gangrenous tissue around the surgical site. The surgical itself is dry and not healing very well. The patient has a lot of pain with any manipulation.     Assessment:     Nonhealing left transmetatarsal amputation, the patient was seen with Dr. Edilia [name] who explained to the patient and his family that we will have to obtain a graft duplex and ABIs today to determine the next move with treatment. The graft is patent per the duplex today.    Plan:     Per Dr. Edilia [name], I explained to the patient and his family that Dr. Edilia [name] will discuss the case with Dr. Myra [name] tomorrow and someone from his office will contact them ASAP with his disposition and plan. The patient and his family state understanding. I did issue a prescription for Percocet 07/25/2023 one by mouth every 6 hours when necessary 30 with no refill .  [name] ANP  Clinic M.D.: [name]

## 2011-10-22 NOTE — Telephone Encounter (Signed)
Niece called regarding worsening of wound left foot.  Stated pt. was seen at [facility] on 7/26, and was sent to the ER due to "gangrene".  Stated that pt. was told in ER that there was no infection, and was sent home.  Niece stated that the wound on his left foot started to open-up/ with sutures pulling-apart, and the ER MD took the sutures out.  States "pt in alot of pain".  Stated "wound is completely open and there is a brown dnge."  Denies fevers or chills.  States instructed to pack with NS gauze.  Voiced concern and requesting pt. be seen ASAP.

## 2011-10-23 ENCOUNTER — Ambulatory Visit: Payer: Medicare Other | Admitting: Neurosurgery

## 2011-10-23 ENCOUNTER — Other Ambulatory Visit: Payer: Self-pay

## 2011-10-28 ENCOUNTER — Ambulatory Visit: Payer: Medicare Other | Admitting: Vascular Surgery

## 2011-10-29 ENCOUNTER — Inpatient Hospital Stay (HOSPITAL_COMMUNITY)
Admission: EM | Admit: 2011-10-29 | Discharge: 2011-11-07 | DRG: 907 | Disposition: A | Discharge status: Other Acute Inpatient Hospital | Payer: Medicare Other | Attending: Internal Medicine | Admitting: Internal Medicine

## 2011-10-29 ENCOUNTER — Encounter (HOSPITAL_COMMUNITY): Payer: Self-pay

## 2011-10-29 ENCOUNTER — Encounter (HOSPITAL_COMMUNITY): Payer: Self-pay | Admitting: *Deleted

## 2011-10-29 DIAGNOSIS — R0902 Hypoxemia: Secondary | ICD-10-CM

## 2011-10-29 DIAGNOSIS — D62 Acute posthemorrhagic anemia: Secondary | ICD-10-CM | POA: Diagnosis not present

## 2011-10-29 DIAGNOSIS — N039 Chronic nephritic syndrome with unspecified morphologic changes: Secondary | ICD-10-CM | POA: Diagnosis present

## 2011-10-29 DIAGNOSIS — R0609 Other forms of dyspnea: Secondary | ICD-10-CM

## 2011-10-29 DIAGNOSIS — A491 Streptococcal infection, unspecified site: Secondary | ICD-10-CM

## 2011-10-29 DIAGNOSIS — I70269 Atherosclerosis of native arteries of extremities with gangrene, unspecified extremity: Secondary | ICD-10-CM

## 2011-10-29 DIAGNOSIS — I4891 Unspecified atrial fibrillation: Secondary | ICD-10-CM

## 2011-10-29 DIAGNOSIS — G9341 Metabolic encephalopathy: Secondary | ICD-10-CM

## 2011-10-29 DIAGNOSIS — I509 Heart failure, unspecified: Secondary | ICD-10-CM | POA: Diagnosis present

## 2011-10-29 DIAGNOSIS — N2581 Secondary hyperparathyroidism of renal origin: Secondary | ICD-10-CM | POA: Diagnosis present

## 2011-10-29 DIAGNOSIS — K2970 Gastritis, unspecified, without bleeding: Secondary | ICD-10-CM

## 2011-10-29 DIAGNOSIS — IMO0002 Reserved for concepts with insufficient information to code with codable children: Principal | ICD-10-CM | POA: Diagnosis present

## 2011-10-29 DIAGNOSIS — R112 Nausea with vomiting, unspecified: Secondary | ICD-10-CM

## 2011-10-29 DIAGNOSIS — N19 Unspecified kidney failure: Secondary | ICD-10-CM

## 2011-10-29 DIAGNOSIS — Z87891 Personal history of nicotine dependence: Secondary | ICD-10-CM

## 2011-10-29 DIAGNOSIS — K2980 Duodenitis without bleeding: Secondary | ICD-10-CM | POA: Diagnosis present

## 2011-10-29 DIAGNOSIS — K449 Diaphragmatic hernia without obstruction or gangrene: Secondary | ICD-10-CM | POA: Diagnosis present

## 2011-10-29 DIAGNOSIS — R197 Diarrhea, unspecified: Secondary | ICD-10-CM

## 2011-10-29 DIAGNOSIS — T148XXA Other injury of unspecified body region, initial encounter: Secondary | ICD-10-CM

## 2011-10-29 DIAGNOSIS — Y95 Nosocomial condition: Secondary | ICD-10-CM

## 2011-10-29 DIAGNOSIS — N498 Inflammatory disorders of other specified male genital organs: Secondary | ICD-10-CM | POA: Diagnosis present

## 2011-10-29 DIAGNOSIS — I120 Hypertensive chronic kidney disease with stage 5 chronic kidney disease or end stage renal disease: Secondary | ICD-10-CM | POA: Diagnosis present

## 2011-10-29 DIAGNOSIS — I2510 Atherosclerotic heart disease of native coronary artery without angina pectoris: Secondary | ICD-10-CM

## 2011-10-29 DIAGNOSIS — M129 Arthropathy, unspecified: Secondary | ICD-10-CM | POA: Diagnosis present

## 2011-10-29 DIAGNOSIS — A419 Sepsis, unspecified organism: Secondary | ICD-10-CM

## 2011-10-29 DIAGNOSIS — L89159 Pressure ulcer of sacral region, unspecified stage: Secondary | ICD-10-CM

## 2011-10-29 DIAGNOSIS — S88919A Complete traumatic amputation of unspecified lower leg, level unspecified, initial encounter: Secondary | ICD-10-CM

## 2011-10-29 DIAGNOSIS — J690 Pneumonitis due to inhalation of food and vomit: Secondary | ICD-10-CM

## 2011-10-29 DIAGNOSIS — I5032 Chronic diastolic (congestive) heart failure: Secondary | ICD-10-CM

## 2011-10-29 DIAGNOSIS — I739 Peripheral vascular disease, unspecified: Secondary | ICD-10-CM

## 2011-10-29 DIAGNOSIS — K2990 Gastroduodenitis, unspecified, without bleeding: Secondary | ICD-10-CM | POA: Diagnosis present

## 2011-10-29 DIAGNOSIS — K922 Gastrointestinal hemorrhage, unspecified: Secondary | ICD-10-CM

## 2011-10-29 DIAGNOSIS — E785 Hyperlipidemia, unspecified: Secondary | ICD-10-CM

## 2011-10-29 DIAGNOSIS — N492 Inflammatory disorders of scrotum: Secondary | ICD-10-CM

## 2011-10-29 DIAGNOSIS — D472 Monoclonal gammopathy: Secondary | ICD-10-CM | POA: Diagnosis present

## 2011-10-29 DIAGNOSIS — K5900 Constipation, unspecified: Secondary | ICD-10-CM

## 2011-10-29 DIAGNOSIS — E162 Hypoglycemia, unspecified: Secondary | ICD-10-CM

## 2011-10-29 DIAGNOSIS — I6350 Cerebral infarction due to unspecified occlusion or stenosis of unspecified cerebral artery: Secondary | ICD-10-CM

## 2011-10-29 DIAGNOSIS — B952 Enterococcus as the cause of diseases classified elsewhere: Secondary | ICD-10-CM | POA: Diagnosis present

## 2011-10-29 DIAGNOSIS — N186 End stage renal disease: Secondary | ICD-10-CM

## 2011-10-29 DIAGNOSIS — L89109 Pressure ulcer of unspecified part of back, unspecified stage: Secondary | ICD-10-CM | POA: Diagnosis present

## 2011-10-29 DIAGNOSIS — J90 Pleural effusion, not elsewhere classified: Secondary | ICD-10-CM

## 2011-10-29 DIAGNOSIS — I898 Other specified noninfective disorders of lymphatic vessels and lymph nodes: Secondary | ICD-10-CM

## 2011-10-29 DIAGNOSIS — G4700 Insomnia, unspecified: Secondary | ICD-10-CM

## 2011-10-29 DIAGNOSIS — S98919A Complete traumatic amputation of unspecified foot, level unspecified, initial encounter: Secondary | ICD-10-CM

## 2011-10-29 DIAGNOSIS — J189 Pneumonia, unspecified organism: Secondary | ICD-10-CM

## 2011-10-29 DIAGNOSIS — T8189XA Other complications of procedures, not elsewhere classified, initial encounter: Secondary | ICD-10-CM

## 2011-10-29 DIAGNOSIS — A0472 Enterocolitis due to Clostridium difficile, not specified as recurrent: Secondary | ICD-10-CM

## 2011-10-29 DIAGNOSIS — J9601 Acute respiratory failure with hypoxia: Secondary | ICD-10-CM

## 2011-10-29 DIAGNOSIS — Y832 Surgical operation with anastomosis, bypass or graft as the cause of abnormal reaction of the patient, or of later complication, without mention of misadventure at the time of the procedure: Secondary | ICD-10-CM | POA: Diagnosis present

## 2011-10-29 DIAGNOSIS — Z8673 Personal history of transient ischemic attack (TIA), and cerebral infarction without residual deficits: Secondary | ICD-10-CM

## 2011-10-29 DIAGNOSIS — I4892 Unspecified atrial flutter: Secondary | ICD-10-CM

## 2011-10-29 DIAGNOSIS — Z992 Dependence on renal dialysis: Secondary | ICD-10-CM | POA: Diagnosis present

## 2011-10-29 DIAGNOSIS — Z79899 Other long term (current) drug therapy: Secondary | ICD-10-CM

## 2011-10-29 DIAGNOSIS — N189 Chronic kidney disease, unspecified: Secondary | ICD-10-CM

## 2011-10-29 DIAGNOSIS — K921 Melena: Secondary | ICD-10-CM | POA: Diagnosis not present

## 2011-10-29 DIAGNOSIS — R1310 Dysphagia, unspecified: Secondary | ICD-10-CM | POA: Diagnosis present

## 2011-10-29 DIAGNOSIS — K31811 Angiodysplasia of stomach and duodenum with bleeding: Secondary | ICD-10-CM | POA: Diagnosis not present

## 2011-10-29 DIAGNOSIS — R578 Other shock: Secondary | ICD-10-CM

## 2011-10-29 DIAGNOSIS — K219 Gastro-esophageal reflux disease without esophagitis: Secondary | ICD-10-CM | POA: Diagnosis present

## 2011-10-29 DIAGNOSIS — L02211 Cutaneous abscess of abdominal wall: Secondary | ICD-10-CM

## 2011-10-29 DIAGNOSIS — L8994 Pressure ulcer of unspecified site, stage 4: Secondary | ICD-10-CM | POA: Diagnosis present

## 2011-10-29 DIAGNOSIS — I959 Hypotension, unspecified: Secondary | ICD-10-CM

## 2011-10-29 DIAGNOSIS — K209 Esophagitis, unspecified without bleeding: Secondary | ICD-10-CM

## 2011-10-29 DIAGNOSIS — K56609 Unspecified intestinal obstruction, unspecified as to partial versus complete obstruction: Secondary | ICD-10-CM

## 2011-10-29 DIAGNOSIS — I998 Other disorder of circulatory system: Secondary | ICD-10-CM

## 2011-10-29 DIAGNOSIS — I70219 Atherosclerosis of native arteries of extremities with intermittent claudication, unspecified extremity: Secondary | ICD-10-CM

## 2011-10-29 DIAGNOSIS — K5520 Angiodysplasia of colon without hemorrhage: Secondary | ICD-10-CM

## 2011-10-29 DIAGNOSIS — I10 Essential (primary) hypertension: Secondary | ICD-10-CM

## 2011-10-29 DIAGNOSIS — D631 Anemia in chronic kidney disease: Secondary | ICD-10-CM

## 2011-10-29 DIAGNOSIS — R579 Shock, unspecified: Secondary | ICD-10-CM

## 2011-10-29 DIAGNOSIS — D649 Anemia, unspecified: Secondary | ICD-10-CM

## 2011-10-29 HISTORY — DX: Peptic ulcer, site unspecified, unspecified as acute or chronic, without hemorrhage or perforation: K27.9

## 2011-10-29 LAB — BASIC METABOLIC PANEL
Calcium: 9.1 mg/dL (ref 8.4–10.5)
GFR calc Af Amer: 19 mL/min — ABNORMAL LOW (ref >90)
GFR calc non Af Amer: 16 mL/min — ABNORMAL LOW (ref >90)
Glucose, Bld: 92 mg/dL (ref 70–99)
Potassium: 3.4 mEq/L — ABNORMAL LOW (ref 3.5–5.1)
Sodium: 139 mEq/L (ref 135–145)

## 2011-10-29 LAB — CBC WITH DIFFERENTIAL/PLATELET
Basophils Absolute: 0.1 10*3/uL (ref 0.0–0.1)
Basophils Relative: 1 % (ref 0–1)
Eosinophils Absolute: 0.2 10*3/uL (ref 0.0–0.7)
Eosinophils Relative: 2 % (ref 0–5)
Lymphs Abs: 1.3 10*3/uL (ref 0.7–4.0)
MCH: 31.4 pg (ref 26.0–34.0)
MCV: 102.7 fL — ABNORMAL HIGH (ref 78.0–100.0)
Neutrophils Relative %: 78 % — ABNORMAL HIGH (ref 43–77)
Platelets: 184 10*3/uL (ref 150–400)
RBC: 3.31 MIL/uL — ABNORMAL LOW (ref 4.22–5.81)
RDW: 21.4 % — ABNORMAL HIGH (ref 11.5–15.5)

## 2011-10-29 MED ORDER — CEFAZOLIN SODIUM-DEXTROSE 2-3 GM-% IV SOLR: 2.0000 g | Freq: Once | INTRAVENOUS | Status: DC

## 2011-10-29 MED ORDER — HYDROCODONE-ACETAMINOPHEN 5-325 MG PO TABS
1.0000 | ORAL_TABLET | Freq: Once | ORAL | Status: AC
  Administered 2011-10-29: 1 via ORAL
  Filled 2011-10-29: qty 1

## 2011-10-29 MED ORDER — SODIUM CHLORIDE 0.9 % IV SOLN: INTRAVENOUS | Status: DC

## 2011-10-29 NOTE — ED Notes (Signed)
States that his home health nurse came for a visit this afternoon, states he has an abscess on his scrotal area.  The nurse told him that the stent area appeared to be draining urine from it and advised being checked at the ER.

## 2011-10-29 NOTE — ED Notes (Signed)
Continuous diarrhea, unable to control bowels at present, specimen collected and sent to lab.

## 2011-10-29 NOTE — ED Notes (Signed)
Pt states he had a stent placed to left groin area for circulation to left leg x 1 mo ago. Home health nurse told pt today that urine seemed to be coming from the site. Pt states boil to scrotal area.

## 2011-10-29 NOTE — ED Provider Notes (Addendum)
History    This chart was scribed for Earl Jakes, MD, MD by Smitty Pluck. The patient was seen in room APA04 and the patient's care was started at 6:46PM.   CSN: [id number]  Arrival date & time 10/29/11  1644   First MD Initiated Contact with Patient 10/29/11 1801      Chief Complaint  Patient presents with  . Wound Check    (Consider location/radiation/quality/duration/timing/severity/associated sxs/prior treatment) Patient is a 61 y.o. male presenting with wound check. The history is provided by the patient.  Wound Check  There has been clear discharge from the wound. The redness has improved. There is no swelling present. The pain has no pain.   YAACOV [name] is a 61 y.o. male who presents to the Emergency Department due to wound check due to drainage since June and boil on left scrotum that is draining onset 1 day ago. Pt had stint placed in left groin in June 2013. The home nurse saw drainage of the area. Pt reports that the wound has been draining since he was discharged from hospital. Pt gets dialysis MWF. Pt had dialysis today. He reports he does not urinate. Pt reports that every time he sits on toilet there is clear drainage from the wound. Pt denies any pain.  Pt is going to [facility] tomorrow for evaluation of leg.  Past Medical History  Diagnosis Date  . Hyperlipidemia   . ESRD (end stage renal disease)     a. MWF dialysis in [place] (followed by Dr. Kristian Covey)  . Arthritis   . Claudication   . Stroke 2005  . Anemia   . Chronic diastolic CHF (congestive heart failure) 07/2010    a. 07/2008 Echo EF 50-55%, mild-mod LVH, Gr 1 DD.  Marland Kitchen Peripheral vascular occlusive disease     a. 07/2011 Periph Angio: No signif Ao-illiac dzs, LCF stenosis, 100% LSFA w recon above knee pop and 3 vessel runoff, 100% RSFA w/ recon above knee --> pending L Fem to below Knee pop bypass.  Marland Kitchen History of tobacco abuse   . Hypertension     takes Amlodipine,Metoprolol,and Prinivil daily  . Dyspnea  on exertion     with exertion  . GERD (gastroesophageal reflux disease)   . Cataract     bilateral  . Dialysis patient     Past Surgical History  Procedure Date  . Arteriovenous graft placement   . Av fistula placement 12/10/2000    Right brachiocephalic arteriovenous   . Hernia repair   . Aortagram 07/29/2011    Abdominal Aortagram  . Cardiac catheterization 08/01/11    Left heart catheterization  . Femoral-popliteal bypass graft 09/09/2011    Procedure: BYPASS GRAFT FEMORAL-POPLITEAL ARTERY;  Surgeon: [name], MD;  Location: [facility] OR;  Service: Vascular;  Laterality: Left;  . Amputation 09/12/2011    Procedure: AMPUTATION BELOW KNEE;  Surgeon: [name], MD;  Location: [facility] OR;  Service: Vascular;  Laterality: Left;  TRANSMETATARSAL    Family History  Problem Relation Age of Onset  . Breast cancer Mother   . Cancer Father   . Anesthesia problems Neg Hx   . Hypotension Neg Hx   . Malignant hyperthermia Neg Hx   . Pseudochol deficiency Neg Hx     History  Substance Use Topics  . Smoking status: Former Smoker -- 1.0 packs/day for 40 years    Types: Cigarettes    Quit date: 03/25/2011  . Smokeless tobacco: Not on file  . Alcohol  Use: No      Review of Systems  Constitutional: Negative for fever and chills.  Respiratory: Negative for cough and shortness of breath.   Gastrointestinal: Negative for nausea, vomiting and diarrhea.  Skin: Positive for wound.  All other systems reviewed and are negative.    Allergies  Review of patient's allergies indicates no known allergies.  Home Medications   Current Outpatient Rx  Name Route Sig Dispense Refill  . CINACALCET HCL 30 MG PO TABS Oral Take 2 tablets (60 mg total) by mouth daily with breakfast. 60 tablet   . GABAPENTIN 100 MG PO CAPS Oral Take 2 capsules (200 mg total) by mouth daily. 60 capsule 1  . ISOSORBIDE MONONITRATE ER 30 MG PO TB24 Oral Take 30 mg by mouth daily.    Marland Kitchen RENA-VITE PO TABS Oral Take 1  tablet by mouth daily.    Marland Kitchen SIMVASTATIN 5 MG PO TABS Oral Take 5 mg by mouth at bedtime.      BP 120/59  Pulse 75  Temp 100.1 F (37.8 C) (Rectal)  Resp 18  Ht 5\' 6"  (1.676 m)  Wt 146 lb (66.225 kg)  BMI 23.56 kg/m2  SpO2 96%  Physical Exam  Nursing note and vitals reviewed. Constitutional: He is oriented to person, place, and time. He appears well-developed and well-nourished. No distress.  HENT:  Head: Normocephalic and atraumatic.  Cardiovascular: Normal rate and regular rhythm.   Murmur (systolic) heard. Pulmonary/Chest: Effort normal and breath sounds normal. No respiratory distress. He has no wheezes. He has no rales.  Abdominal: Bowel sounds are normal. He exhibits no distension. There is no tenderness.  Genitourinary:       The left groin has 7 cm healed incision. At top 3 mm opening with pink tissue and clear fluid coming out of hole. The fluid is not bloody or purulent.  Left scrotum has area of 5 cm induration and opening that is draining purulent material.  Right scrotum is nl. uncircumcised penis   Musculoskeletal:       Partial amputation of left foot   Neurological: He is alert and oriented to person, place, and time.  Skin: Skin is warm and dry.  Psychiatric: He has a normal mood and affect. His behavior is normal.    ED Course  Procedures (including critical care time) DIAGNOSTIC STUDIES: Oxygen Saturation is 100% on room air, normal by my interpretation.    COORDINATION OF CARE: 6:50PM EDP discusses pt ED treatment with pt  8:14PM EDP ordered medication:  Scheduled Meds:    . ampicillin-sulbactam (UNASYN) IV  3 g Intravenous Once  . HYDROcodone-acetaminophen  1 tablet Oral Once  . HYDROmorphone  1 mg Intravenous Once  . ondansetron  4 mg Intravenous Once   Continuous Infusions:    . ciprofloxacin Stopped (10/30/11 0103)  . vancomycin     PRN Meds:.    Labs Reviewed  CBC WITH DIFFERENTIAL - Abnormal; Notable for the following:    RBC  3.31 (*)     Hemoglobin 10.4 (*)     HCT 34.0 (*)     MCV 102.7 (*)     RDW 21.4 (*)     Neutrophils Relative 78 (*)     All other components within normal limits  BASIC METABOLIC PANEL - Abnormal; Notable for the following:    Potassium 3.4 (*)     Creatinine, Ser 3.65 (*)     GFR calc non Af Amer 16 (*)     GFR  calc Af Amer 19 (*)     All other components within normal limits  STOOL CULTURE   No results found. Results for orders placed during the hospital encounter of 10/29/11  CBC WITH DIFFERENTIAL      Component Value Range   WBC 10.0  4.0 - 10.5 K/uL   RBC 3.31 (*) 4.22 - 5.81 MIL/uL   Hemoglobin 10.4 (*) 13.0 - 17.0 g/dL   HCT 16.1 (*) 09.6 - 04.5 %   MCV 102.7 (*) 78.0 - 100.0 fL   MCH 31.4  26.0 - 34.0 pg   MCHC 30.6  30.0 - 36.0 g/dL   RDW 40.9 (*) 81.1 - 91.4 %   Platelets 184  150 - 400 K/uL   Neutrophils Relative 78 (*) 43 - 77 %   Neutro Abs 7.7  1.7 - 7.7 K/uL   Lymphocytes Relative 12  12 - 46 %   Lymphs Abs 1.3  0.7 - 4.0 K/uL   Monocytes Relative 7  3 - 12 %   Monocytes Absolute 0.7  0.1 - 1.0 K/uL   Eosinophils Relative 2  0 - 5 %   Eosinophils Absolute 0.2  0.0 - 0.7 K/uL   Basophils Relative 1  0 - 1 %   Basophils Absolute 0.1  0.0 - 0.1 K/uL  BASIC METABOLIC PANEL      Component Value Range   Sodium 139  135 - 145 mEq/L   Potassium 3.4 (*) 3.5 - 5.1 mEq/L   Chloride 102  96 - 112 mEq/L   CO2 25  19 - 32 mEq/L   Glucose, Bld 92  70 - 99 mg/dL   BUN 21  6 - 23 mg/dL   Creatinine, Ser 7.82 (*) 0.50 - 1.35 mg/dL   Calcium 9.1  8.4 - 95.6 mg/dL   GFR calc non Af Amer 16 (*) >90 mL/min   GFR calc Af Amer 19 (*) >90 mL/min     1. Scrotal abscess   2. Renal failure   3. Wound drainage       MDM  Patient is a chronic renal failure patient also significant peripheral vascular disease status post   left groin area bibasilar surgery at [facility] in June since that time he said clear fluid draining from the top portion of the wound no evidence of  infection. Unknown to the patient he also has an left scrotal abscess which is draining purulent fluid. Fair amount of swelling and induration to the lateral aspect of the scrotum. For this he will require admission and antibiotics and urology consultation. The draining wound from the vascular side is clear serosanguineous not seropurulent could possibly be urine but the patient's chronic renal failure patient doesn't make any urine but could be a bladder fistula. This will need assessment by vascular surgery when he is hospitalized. Discussed with the hospitalist they will come see him and make arrangements for admission to [facility]. In the emergency apartment he was given IV Unasyn and will be given a dose of IV vancomycin.  Dialysis days for this patient are Monday Wednesday and Friday.  In discussion with the hospitalist recommend of urology consultation in vascular consultation recommended patient be continued on IV antibiotics. Urology to determine whether they scrotal abscess needs to be opened or just treated with antibiotics. Vascular and make determination about the chronic wound drainage from the left groin area.    I personally performed the services described in this documentation, which was scribed in my presence.  The recorded information has been reviewed and considered.         Earl Jakes, MD 10/30/11 1610  Earl Jakes, MD 11/01/11 1059

## 2011-10-29 NOTE — ED Notes (Signed)
Asks me to allow pain medications to work before attempting IV access.

## 2011-10-29 NOTE — ED Notes (Signed)
Bandage noted to left foot with brown drainage.  Wound noted to left groin, which family states was a stent placement, area appears red.  Pt states he has a "boil" on his scrotum, which is draining.  Family states when the patient stands up, the area in the left groin (area of stent placement) appears as a hernia and the patient is able to express a "urine like substance" from the area.

## 2011-10-30 ENCOUNTER — Ambulatory Visit (HOSPITAL_COMMUNITY): Admission: RE | Admit: 2011-10-30 | Payer: Medicare Other | Source: Ambulatory Visit | Admitting: Surgery

## 2011-10-30 ENCOUNTER — Encounter (HOSPITAL_COMMUNITY)
Admission: EM | Disposition: A | Discharge status: Nursing Home (Includes ICF) | Payer: Self-pay | Source: Home / Self Care | Attending: Internal Medicine

## 2011-10-30 ENCOUNTER — Encounter (HOSPITAL_COMMUNITY): Payer: Self-pay | Admitting: Certified Registered"

## 2011-10-30 ENCOUNTER — Encounter: Payer: Self-pay | Admitting: Vascular Surgery

## 2011-10-30 ENCOUNTER — Inpatient Hospital Stay (HOSPITAL_COMMUNITY): Payer: Medicare Other | Admitting: Certified Registered"

## 2011-10-30 DIAGNOSIS — Z992 Dependence on renal dialysis: Secondary | ICD-10-CM

## 2011-10-30 DIAGNOSIS — R197 Diarrhea, unspecified: Secondary | ICD-10-CM | POA: Diagnosis present

## 2011-10-30 DIAGNOSIS — I70269 Atherosclerosis of native arteries of extremities with gangrene, unspecified extremity: Secondary | ICD-10-CM

## 2011-10-30 DIAGNOSIS — N492 Inflammatory disorders of scrotum: Secondary | ICD-10-CM | POA: Diagnosis present

## 2011-10-30 DIAGNOSIS — I739 Peripheral vascular disease, unspecified: Secondary | ICD-10-CM

## 2011-10-30 DIAGNOSIS — N498 Inflammatory disorders of other specified male genital organs: Secondary | ICD-10-CM

## 2011-10-30 HISTORY — DX: Gastro-esophageal reflux disease without esophagitis: K21.9

## 2011-10-30 HISTORY — DX: Unspecified cataract: H26.9

## 2011-10-30 HISTORY — PX: APPLICATION OF WOUND VAC: SHX5189

## 2011-10-30 LAB — CBC
HCT: 31.1 % — ABNORMAL LOW (ref 39.0–52.0)
MCH: 30.5 pg (ref 26.0–34.0)
MCV: 102 fL — ABNORMAL HIGH (ref 78.0–100.0)
Platelets: 193 10*3/uL (ref 150–400)
RDW: 20.7 % — ABNORMAL HIGH (ref 11.5–15.5)
WBC: 9 10*3/uL (ref 4.0–10.5)

## 2011-10-30 LAB — SURGICAL PCR SCREEN
MRSA, PCR: NEGATIVE
Staphylococcus aureus: NEGATIVE

## 2011-10-30 LAB — BASIC METABOLIC PANEL
BUN: 26 mg/dL — ABNORMAL HIGH (ref 6–23)
CO2: 26 mEq/L (ref 19–32)
Calcium: 9 mg/dL (ref 8.4–10.5)
Chloride: 103 mEq/L (ref 96–112)
Creatinine, Ser: 4.2 mg/dL — ABNORMAL HIGH (ref 0.50–1.35)
Glucose, Bld: 60 mg/dL — ABNORMAL LOW (ref 70–99)

## 2011-10-30 SURGERY — AMPUTATION, FOOT, TRANSMETATARSAL
Anesthesia: General | Site: Foot | Laterality: Left | Wound class: Dirty or Infected

## 2011-10-30 MED ORDER — LIDOCAINE HCL (CARDIAC) 20 MG/ML IV SOLN
INTRAVENOUS | Status: DC | PRN
  Administered 2011-10-30: 80 mg via INTRAVENOUS

## 2011-10-30 MED ORDER — NEPRO/CARBSTEADY PO LIQD
237.0000 mL | Freq: Two times a day (BID) | ORAL | Status: DC
  Administered 2011-10-30 – 2011-11-07 (×7): 237 mL via ORAL
  Filled 2011-10-30 (×7): qty 237

## 2011-10-30 MED ORDER — VANCOMYCIN 50 MG/ML ORAL SOLUTION: 125.0000 mg | Freq: Every day | ORAL | Status: DC

## 2011-10-30 MED ORDER — VANCOMYCIN 50 MG/ML ORAL SOLUTION: 125.0000 mg | ORAL | Status: DC

## 2011-10-30 MED ORDER — ONDANSETRON HCL 4 MG/2ML IJ SOLN
4.0000 mg | Freq: Four times a day (QID) | INTRAMUSCULAR | Status: DC | PRN
  Administered 2011-11-01 – 2011-11-02 (×3): 4 mg via INTRAVENOUS
  Filled 2011-10-30 (×4): qty 2

## 2011-10-30 MED ORDER — MORPHINE SULFATE 4 MG/ML IJ SOLN
4.0000 mg | INTRAMUSCULAR | Status: DC | PRN
  Administered 2011-10-30 – 2011-11-05 (×22): 4 mg via INTRAVENOUS
  Filled 2011-10-30 (×21): qty 1

## 2011-10-30 MED ORDER — MORPHINE SULFATE 4 MG/ML IJ SOLN
4.0000 mg | INTRAMUSCULAR | Status: DC | PRN
  Administered 2011-10-30 (×2): 4 mg via INTRAVENOUS
  Filled 2011-10-30 (×2): qty 1

## 2011-10-30 MED ORDER — FENTANYL CITRATE 0.05 MG/ML IJ SOLN
INTRAMUSCULAR | Status: DC | PRN
  Administered 2011-10-30: 100 ug via INTRAVENOUS

## 2011-10-30 MED ORDER — IOHEXOL 300 MG/ML  SOLN
20.0000 mL | INTRAMUSCULAR | Status: AC
  Administered 2011-10-30 (×2): 20 mL via ORAL

## 2011-10-30 MED ORDER — VANCOMYCIN HCL 1000 MG IV SOLR
750.0000 mg | INTRAVENOUS | Status: DC
  Filled 2011-10-30: qty 750

## 2011-10-30 MED ORDER — PROPOFOL 10 MG/ML IV EMUL
INTRAVENOUS | Status: DC | PRN
  Administered 2011-10-30: 110 mg via INTRAVENOUS

## 2011-10-30 MED ORDER — SODIUM CHLORIDE 0.9 % IJ SOLN
3.0000 mL | Freq: Two times a day (BID) | INTRAMUSCULAR | Status: DC
  Administered 2011-10-31 – 2011-11-06 (×7): 3 mL via INTRAVENOUS

## 2011-10-30 MED ORDER — HYDROMORPHONE HCL PF 1 MG/ML IJ SOLN
0.2500 mg | INTRAMUSCULAR | Status: DC | PRN
  Administered 2011-10-30 (×4): 0.5 mg via INTRAVENOUS

## 2011-10-30 MED ORDER — OXYCODONE-ACETAMINOPHEN 5-325 MG PO TABS
1.0000 | ORAL_TABLET | ORAL | Status: DC | PRN
  Administered 2011-10-30 – 2011-11-04 (×11): 2 via ORAL
  Filled 2011-10-30 (×10): qty 2

## 2011-10-30 MED ORDER — HYDROMORPHONE HCL PF 1 MG/ML IJ SOLN
INTRAMUSCULAR | Status: AC
  Filled 2011-10-30: qty 2

## 2011-10-30 MED ORDER — ONDANSETRON HCL 4 MG/2ML IJ SOLN
INTRAMUSCULAR | Status: DC | PRN
  Administered 2011-10-30: 4 mg via INTRAVENOUS

## 2011-10-30 MED ORDER — MIDAZOLAM HCL 5 MG/5ML IJ SOLN
INTRAMUSCULAR | Status: DC | PRN
  Administered 2011-10-30: 2 mg via INTRAVENOUS

## 2011-10-30 MED ORDER — CIPROFLOXACIN IN D5W 400 MG/200ML IV SOLN
400.0000 mg | Freq: Once | INTRAVENOUS | Status: AC
  Administered 2011-10-30: 400 mg via INTRAVENOUS

## 2011-10-30 MED ORDER — CIPROFLOXACIN IN D5W 400 MG/200ML IV SOLN
INTRAVENOUS | Status: AC
  Administered 2011-10-30: 400 mg via INTRAVENOUS
  Filled 2011-10-30: qty 200

## 2011-10-30 MED ORDER — SODIUM CHLORIDE 0.9 % IV SOLN
3.0000 g | Freq: Once | INTRAVENOUS | Status: AC
  Administered 2011-10-30: 3 g via INTRAVENOUS
  Filled 2011-10-30: qty 3

## 2011-10-30 MED ORDER — ISOSORBIDE MONONITRATE ER 30 MG PO TB24
30.0000 mg | ORAL_TABLET | Freq: Every day | ORAL | Status: DC
  Administered 2011-10-31 – 2011-11-05 (×5): 30 mg via ORAL
  Filled 2011-10-30 (×9): qty 1

## 2011-10-30 MED ORDER — HYDROMORPHONE HCL PF 1 MG/ML IJ SOLN
0.5000 mg | INTRAMUSCULAR | Status: DC | PRN
  Filled 2011-10-30: qty 1

## 2011-10-30 MED ORDER — SODIUM CHLORIDE 0.9 % IV SOLN
INTRAVENOUS | Status: DC | PRN
  Administered 2011-10-30: 07:00:00 via INTRAVENOUS

## 2011-10-30 MED ORDER — METRONIDAZOLE 500 MG PO TABS
500.0000 mg | ORAL_TABLET | Freq: Three times a day (TID) | ORAL | Status: DC
  Administered 2011-10-30 – 2011-11-01 (×7): 500 mg via ORAL
  Filled 2011-10-30 (×10): qty 1

## 2011-10-30 MED ORDER — PIPERACILLIN-TAZOBACTAM IN DEX 2-0.25 GM/50ML IV SOLN
2.2500 g | Freq: Three times a day (TID) | INTRAVENOUS | Status: DC
  Administered 2011-10-30 – 2011-11-04 (×15): 2.25 g via INTRAVENOUS
  Filled 2011-10-30 (×19): qty 50

## 2011-10-30 MED ORDER — VANCOMYCIN 50 MG/ML ORAL SOLUTION
125.0000 mg | Freq: Four times a day (QID) | ORAL | Status: DC
  Administered 2011-10-30 – 2011-11-01 (×9): 125 mg via ORAL
  Filled 2011-10-30 (×14): qty 2.5

## 2011-10-30 MED ORDER — HYDROMORPHONE HCL PF 1 MG/ML IJ SOLN
1.0000 mg | Freq: Once | INTRAMUSCULAR | Status: AC
  Administered 2011-10-30: 1 mg via INTRAVENOUS
  Filled 2011-10-30: qty 1

## 2011-10-30 MED ORDER — GABAPENTIN 100 MG PO CAPS
200.0000 mg | ORAL_CAPSULE | Freq: Every day | ORAL | Status: DC
  Administered 2011-10-31 – 2011-11-05 (×6): 200 mg via ORAL
  Filled 2011-10-30 (×9): qty 2

## 2011-10-30 MED ORDER — PROMETHAZINE HCL 25 MG/ML IJ SOLN: 6.2500 mg | INTRAMUSCULAR | Status: DC | PRN

## 2011-10-30 MED ORDER — MEPERIDINE HCL 25 MG/ML IJ SOLN: 6.2500 mg | INTRAMUSCULAR | Status: DC | PRN

## 2011-10-30 MED ORDER — VANCOMYCIN HCL 500 MG IV SOLR
500.0000 mg | Freq: Once | INTRAVENOUS | Status: AC
  Administered 2011-10-30: 500 mg via INTRAVENOUS
  Filled 2011-10-30: qty 500

## 2011-10-30 MED ORDER — ONDANSETRON HCL 4 MG PO TABS: 4.0000 mg | ORAL_TABLET | Freq: Four times a day (QID) | ORAL | Status: DC | PRN

## 2011-10-30 MED ORDER — ONDANSETRON HCL 4 MG/2ML IJ SOLN
4.0000 mg | Freq: Once | INTRAMUSCULAR | Status: AC
  Administered 2011-10-30: 4 mg via INTRAVENOUS
  Filled 2011-10-30: qty 2

## 2011-10-30 MED ORDER — VANCOMYCIN 50 MG/ML ORAL SOLUTION: 125.0000 mg | Freq: Two times a day (BID) | ORAL | Status: DC

## 2011-10-30 MED ORDER — PHENYLEPHRINE HCL 10 MG/ML IJ SOLN
INTRAMUSCULAR | Status: DC | PRN
  Administered 2011-10-30 (×4): 80 ug via INTRAVENOUS

## 2011-10-30 MED ORDER — LIDOCAINE-EPINEPHRINE 1 %-1:100000 IJ SOLN
10.0000 mL | Freq: Once | INTRAMUSCULAR | Status: DC
  Filled 2011-10-30: qty 10

## 2011-10-30 MED ORDER — VANCOMYCIN HCL IN DEXTROSE 1-5 GM/200ML-% IV SOLN
1000.0000 mg | Freq: Once | INTRAVENOUS | Status: AC
  Administered 2011-10-30: 1000 mg via INTRAVENOUS
  Filled 2011-10-30: qty 200

## 2011-10-30 MED ORDER — 0.9 % SODIUM CHLORIDE (POUR BTL) OPTIME
TOPICAL | Status: DC | PRN
  Administered 2011-10-30: 1000 mL

## 2011-10-30 SURGICAL SUPPLY — 45 items
BANDAGE ELASTIC 4 VELCRO ST LF (GAUZE/BANDAGES/DRESSINGS) IMPLANT
BANDAGE ELASTIC 6 VELCRO ST LF (GAUZE/BANDAGES/DRESSINGS) IMPLANT
BANDAGE GAUZE ELAST BULKY 4 IN (GAUZE/BANDAGES/DRESSINGS) IMPLANT
BLADE LONG MED 31X9 (MISCELLANEOUS) ×2 IMPLANT
CANISTER SUCTION 2500CC (MISCELLANEOUS) ×2 IMPLANT
CANISTER WOUND CARE 500ML ATS (WOUND CARE) ×2 IMPLANT
CLIP TI MEDIUM 6 (CLIP) ×2 IMPLANT
CLIP TI WIDE RED SMALL 6 (CLIP) ×2 IMPLANT
CLOTH BEACON ORANGE TIMEOUT ST (SAFETY) ×2 IMPLANT
COVER SURGICAL LIGHT HANDLE (MISCELLANEOUS) ×2 IMPLANT
DRAPE EXTREMITY BILATERAL (DRAPE) IMPLANT
DRAPE EXTREMITY T 121X128X90 (DRAPE) ×2 IMPLANT
DRAPE U-SHAPE 76X120 STRL (DRAPES) IMPLANT
DRSG ADAPTIC 3X8 NADH LF (GAUZE/BANDAGES/DRESSINGS) ×2 IMPLANT
DRSG TEGADERM 4X4.75 (GAUZE/BANDAGES/DRESSINGS) ×4 IMPLANT
DRSG VAC ATS SM SENSATRAC (GAUZE/BANDAGES/DRESSINGS) ×2 IMPLANT
ELECT REM PT RETURN 9FT ADLT (ELECTROSURGICAL) ×2
ELECTRODE REM PT RTRN 9FT ADLT (ELECTROSURGICAL) ×1 IMPLANT
GAUZE XEROFORM 5X9 LF (GAUZE/BANDAGES/DRESSINGS) IMPLANT
GLOVE BIOGEL PI IND STRL 6.5 (GLOVE) ×2 IMPLANT
GLOVE BIOGEL PI IND STRL 7.5 (GLOVE) ×1 IMPLANT
GLOVE BIOGEL PI INDICATOR 6.5 (GLOVE) ×2
GLOVE BIOGEL PI INDICATOR 7.5 (GLOVE) ×1
GLOVE ECLIPSE 6.5 STRL STRAW (GLOVE) ×2 IMPLANT
GLOVE SURG SS PI 7.5 STRL IVOR (GLOVE) ×2 IMPLANT
GOWN PREVENTION PLUS XXLARGE (GOWN DISPOSABLE) ×2 IMPLANT
GOWN STRL NON-REIN LRG LVL3 (GOWN DISPOSABLE) ×4 IMPLANT
KIT BASIN OR (CUSTOM PROCEDURE TRAY) ×2 IMPLANT
KIT ROOM TURNOVER OR (KITS) ×2 IMPLANT
NS IRRIG 1000ML POUR BTL (IV SOLUTION) ×2 IMPLANT
PACK CV ACCESS (CUSTOM PROCEDURE TRAY) IMPLANT
PACK GENERAL/GYN (CUSTOM PROCEDURE TRAY) ×2 IMPLANT
PACK UNIVERSAL I (CUSTOM PROCEDURE TRAY) IMPLANT
PAD ARMBOARD 7.5X6 YLW CONV (MISCELLANEOUS) ×4 IMPLANT
PAD NEG PRESSURE SENSATRAC (MISCELLANEOUS) ×2 IMPLANT
SPONGE GAUZE 4X4 12PLY (GAUZE/BANDAGES/DRESSINGS) ×2 IMPLANT
STAPLER VISISTAT 35W (STAPLE) IMPLANT
SUT ETHILON 3 0 PS 1 (SUTURE) ×2 IMPLANT
SUT VIC AB 2-0 CTX 36 (SUTURE) IMPLANT
SUT VIC AB 3-0 SH 27 (SUTURE)
SUT VIC AB 3-0 SH 27X BRD (SUTURE) IMPLANT
SUT VICRYL 4-0 PS2 18IN ABS (SUTURE) IMPLANT
TOWEL OR 17X24 6PK STRL BLUE (TOWEL DISPOSABLE) ×2 IMPLANT
TOWEL OR 17X26 10 PK STRL BLUE (TOWEL DISPOSABLE) ×2 IMPLANT
WATER STERILE IRR 1000ML POUR (IV SOLUTION) ×2 IMPLANT

## 2011-10-30 NOTE — Progress Notes (Signed)
Report given to Sharon H. RN as primary care giver. 

## 2011-10-30 NOTE — Progress Notes (Signed)
ANTIBIOTIC CONSULT NOTE - INITIAL  Pharmacy Consult for Vancocin/Zosyn Indication: scrotal abscess  No Known Allergies  Patient Measurements: Height: 5\' 6"  (167.6 cm) Weight: 146 lb (66.225 kg) IBW/kg (Calculated) : 63.8   Vital Signs: Temp: 100.1 F (37.8 C) (08/07 2342) Temp src: Rectal (08/07 2342) BP: 128/58 mmHg (08/08 0156) Pulse Rate: 82  (08/08 0132)  Labs:  [name] 10/29/11 2027  WBC 10.0  HGB 10.4*  PLT 184  LABCREA --  CREATININE 3.65*   Estimated Creatinine Clearance: 18.9 ml/min (by C-G formula based on Cr of 3.65).  Microbiology: Recent Results (from the past 720 hour(s))  MRSA PCR SCREENING     Status: Abnormal   Collection Time   10/11/11  4:35 PM      Component Value Range Status Comment   MRSA by PCR POSITIVE (*) NEGATIVE Final     Medical History: Past Medical History  Diagnosis Date  . Hyperlipidemia   . ESRD (end stage renal disease)     a. MWF dialysis in [place] (followed by Dr. Kristian Covey)  . Arthritis   . Claudication   . Stroke 2005  . Anemia   . Chronic diastolic CHF (congestive heart failure) 07/2010    a. 07/2008 Echo EF 50-55%, mild-mod LVH, Gr 1 DD.  Marland Kitchen Peripheral vascular occlusive disease     a. 07/2011 Periph Angio: No signif Ao-illiac dzs, LCF stenosis, 100% LSFA w recon above knee pop and 3 vessel runoff, 100% RSFA w/ recon above knee --> pending L Fem to below Knee pop bypass.  Marland Kitchen History of tobacco abuse   . Hypertension     takes Amlodipine,Metoprolol,and Prinivil daily  . Dyspnea on exertion     with exertion  . GERD (gastroesophageal reflux disease)   . Cataract     bilateral  . Dialysis patient     Medications:  Prescriptions prior to admission  Medication Sig Dispense Refill  . cinacalcet (SENSIPAR) 30 MG tablet Take 2 tablets (60 mg total) by mouth daily with breakfast.  60 tablet    . gabapentin (NEURONTIN) 100 MG capsule Take 2 capsules (200 mg total) by mouth daily.  60 capsule  1  . isosorbide mononitrate  (IMDUR) 30 MG 24 hr tablet Take 30 mg by mouth daily.      . multivitamin (RENA-VIT) TABS tablet Take 1 tablet by mouth daily.      . simvastatin (ZOCOR) 5 MG tablet Take 5 mg by mouth at bedtime.       Scheduled:    . ampicillin-sulbactam (UNASYN) IV  3 g Intravenous Once  . ciprofloxacin  400 mg Intravenous Once  . gabapentin  200 mg Oral Daily  . HYDROcodone-acetaminophen  1 tablet Oral Once  . HYDROmorphone  1 mg Intravenous Once  . isosorbide mononitrate  30 mg Oral Daily  . metroNIDAZOLE  500 mg Oral Q8H  . ondansetron  4 mg Intravenous Once  . piperacillin-tazobactam (ZOSYN)  IV  2.25 g Intravenous Q8H  . sodium chloride  3 mL Intravenous Q12H  . vancomycin  125 mg Oral QID   Followed by  . vancomycin  125 mg Oral BID   Followed by  . vancomycin  125 mg Oral Daily   Followed by  . vancomycin  125 mg Oral QODAY   Followed by  . vancomycin  125 mg Oral Q3 days  . vancomycin  500 mg Intravenous Once  . vancomycin  750 mg Intravenous Q M,W,F-HD  . vancomycin  1,000 mg Intravenous  Once    Assessment: 61yo male with recent hospital admissions including for septic shock now c/o draining inguinal wound x several weeks as well as tender swelling of left scrotum x2d, says the scrotal abscess "burst" when coming to ED, to begin IV ABX, awaiting I&D and urology consult.   Goal of Therapy:  Pre-HD vanc level 15-25  Plan:  Rec'd vanc 1g at [facility]; will give additional vanc 500mg  for total load of 1500mg  then begin vanc 750mg  IV after each HD as well as Zosyn 2.25g IV Q8H and monitor CBC, Cx, levels prn.  Colleen Can PharmD BCPS 10/30/2011,4:07 AM

## 2011-10-30 NOTE — Anesthesia Procedure Notes (Signed)
Procedure Name: LMA Insertion Date/Time: 10/30/2011 8:10 AM Performed by: Jefm Miles E Pre-anesthesia Checklist: Patient identified, Timeout performed, Suction available, Emergency Drugs available and Patient being monitored Patient Re-evaluated:Patient Re-evaluated prior to inductionOxygen Delivery Method: Circle system utilized Preoxygenation: Pre-oxygenation with 100% oxygen Intubation Type: IV induction LMA: LMA inserted LMA Size: 4.0 Number of attempts: 1 Placement Confirmation: positive ETCO2 and breath sounds checked- equal and bilateral Tube secured with: Tape Dental Injury: Teeth and Oropharynx as per pre-operative assessment

## 2011-10-30 NOTE — Consult Note (Addendum)
WOC consult Note Reason for Consult: Consult requested for sacral wound.  Pt is followed by vascular team for right foot  wound. Wound type: Consult has been requested for urology to assess groin area with possible abscess. Right heel: Deep tissue injury 5x5 deep tissue injury, dark brown Left heel: 3 1/4 x 2 100% eschar with no odor or drainage Sacral chronic stage IV  3 1/4 x 5 1/4 x 2cm, tunneling 4cm at 1200. Inner wound bed red, moderate greenish-yellow drainage. No odor. Bone palpable with swab. Pink scar tissue surrounding wound.   Pressure Ulcer POA: Yes  Dressing procedure/placement/frequency: Left heel: It is best practice to leave dry stable eschar intact. No topical treatment needed. Air overlay mattress for pressure reduction. Sacral pressure ulcer: Aquacell to provide antimicrobial benefits and absorb drainage. Education provided regarding pressure ulcer prevention measures including; importance of repositioning, air mattress to reduce pressure, adequate po intake, and pressure reduction to heels.    Cammie Mcgee, RN, MSN, [employer]  [phone]

## 2011-10-30 NOTE — Progress Notes (Signed)
Patient with hx of ESRD on dialysis, PVD, chronic sacral decub, CVA, amongst other dx transferred from [facility]. He was scheduled to have wound vac placed today 10/30/11 because of nonhealing surgical wound on left foot (s/p left transmetatarsal amputation) but presented to the ER yesterday 8/7 because of increasing drainage from his chronic inguinal wound from non healing fem/pop bypass incision. During transportation he noted drainage from the scrotum. He was found to have an abscess in the inferior scrotum. He had a low grade fever. He was placed on vanc IV and zosyn. It was felt that the patient needed urology to evaluate the scrotal abscess. He was transferred to [facility] for this as well as for his wound vac procedure. Patient developed diarrhea in the ER. He has a history of C. Diff colitis, so PO vanc and flagyl were started pending PCR. Pt currently in pain, particularly in his chronic sacral decub, morphine seems to help him the most. Orders were placed by Dr. Orvan Falconer at [facility] including nephro consult for M,W,F dialysis, urology, and to the surgeon for wound vac placement. I also ordered a wound care consult and cultures of the drainage from his scrotal abscess since this has not been done yet.   VSS. NAD. A&O x 3. Brief exam shows induration in the inferior portion of the scrotum/perineum with purulent drainage when pressure applied. Stage 4 sacral decubitus with small amount of purulent drainage, no erythema surrounding. Open areas on left groin region with small amount of drainage, no erythema or induration. Has gauze wrapping on left foot with purulent drainage soaking through it. Lungs CTA, heart RRR, no murmurs.   Please refer to Dr. Blair Dolphin H&P for further details as well as his sign-out note.

## 2011-10-30 NOTE — Consult Note (Signed)
Urology Consult  Referring physician: Dr. Deno Etienne Reason for referral: Scrotal abscess  History of Present Illness: [name] is a 61 year old male with end stage renal disease and peripheral vascular disease presented to the emergency room at [facility] with a four-day history of left hemiscrotal pain. He was seen and evaluated there. Of course as is typical a urologist, although there are practicing urologist in that city, was not available to take care of the patient. He was therefore sent here for treatment. He indicates that he has had a previous scrotal abscess that he described as a "boil" that he pop himself in the past with complete resolution. He has not tried to manipulate this lesion. It causes discomfort with movement of his inner thigh against the scrotum. He's not had any fever.  Past Medical History  Diagnosis Date  . Hyperlipidemia   . ESRD (end stage renal disease)     a. MWF dialysis in [place] (followed by Dr. Kristian Covey)  . Arthritis   . Claudication   . Stroke 2005  . Anemia   . Chronic diastolic CHF (congestive heart failure) 07/2010    a. 07/2008 Echo EF 50-55%, mild-mod LVH, Gr 1 DD.  Marland Kitchen Peripheral vascular occlusive disease     a. 07/2011 Periph Angio: No signif Ao-illiac dzs, LCF stenosis, 100% LSFA w recon above knee pop and 3 vessel runoff, 100% RSFA w/ recon above knee --> pending L Fem to below Knee pop bypass.  Marland Kitchen History of tobacco abuse   . Hypertension     takes Amlodipine,Metoprolol,and Prinivil daily  . Dyspnea on exertion     with exertion  . GERD (gastroesophageal reflux disease)   . Cataract     bilateral  . Dialysis patient    Past Surgical History  Procedure Date  . Arteriovenous graft placement   . Av fistula placement 12/10/2000    Right brachiocephalic arteriovenous   . Hernia repair   . Aortagram 07/29/2011    Abdominal Aortagram  . Cardiac catheterization 08/01/11    Left heart catheterization  . Femoral-popliteal bypass graft  09/09/2011    Procedure: BYPASS GRAFT FEMORAL-POPLITEAL ARTERY;  Surgeon: [name], MD;  Location: [facility] OR;  Service: Vascular;  Laterality: Left;  . Amputation 09/12/2011    Procedure: AMPUTATION BELOW KNEE;  Surgeon: [name], MD;  Location: [facility] OR;  Service: Vascular;  Laterality: Left;  TRANSMETATARSAL    Medications:  Scheduled:   . ampicillin-sulbactam (UNASYN) IV  3 g Intravenous Once  . ciprofloxacin  400 mg Intravenous Once  . feeding supplement (NEPRO CARB STEADY)  237 mL Oral BID BM  . gabapentin  200 mg Oral Daily  . HYDROcodone-acetaminophen  1 tablet Oral Once  . HYDROmorphone      . HYDROmorphone  1 mg Intravenous Once  . isosorbide mononitrate  30 mg Oral Daily  . metroNIDAZOLE  500 mg Oral Q8H  . ondansetron  4 mg Intravenous Once  . piperacillin-tazobactam (ZOSYN)  IV  2.25 g Intravenous Q8H  . sodium chloride  3 mL Intravenous Q12H  . vancomycin  125 mg Oral QID   Followed by  . vancomycin  125 mg Oral BID   Followed by  . vancomycin  125 mg Oral Daily   Followed by  . vancomycin  125 mg Oral QODAY   Followed by  . vancomycin  125 mg Oral Q3 days  . vancomycin  500 mg Intravenous Once  . vancomycin  750 mg Intravenous Q  M,W,F-HD  . vancomycin  1,000 mg Intravenous Once    Allergies: No Known Allergies  Family History  Problem Relation Age of Onset  . Breast cancer Mother   . Cancer Father   . Anesthesia problems Neg Hx   . Hypotension Neg Hx   . Malignant hyperthermia Neg Hx   . Pseudochol deficiency Neg Hx     Social History:  reports that he quit smoking about 7 months ago. His smoking use included Cigarettes. He has a 40 pack-year smoking history. He does not have any smokeless tobacco history on file. He reports that he does not drink alcohol or use illicit drugs.  Review of Systems: Pertinent items are noted in HPI. A comprehensive review of systems was negative except for: Genitourinary: positive for Pain in the left side of his  scrotum. He also complains of some discomfort in his left lower extremity where he had surgery earlier today.  Physical Exam:  Vital signs in last 24 hours: Temp:  [97 F (36.1 C)-100.1 F (37.8 C)] 98.2 F (36.8 C) (08/08 1500) Pulse Rate:  [64-143] 143  (08/08 1500) Resp:  [9-20] 18  (08/08 1500) BP: (120-156)/(56-82) 146/73 mmHg (08/08 1500) SpO2:  [86 %-100 %] 93 % (08/08 1500) Weight:  [61.2 kg (134 lb 14.7 oz)-66.225 kg (146 lb)] 61.2 kg (134 lb 14.7 oz) (08/08 0515) General appearance: alert and appears stated age Head: Normocephalic, without obvious abnormality, atraumatic Eyes: conjunctivae/corneas clear. EOM's intact.  Oropharynx: moist mucous membranes Neck: supple, symmetrical, trachea midline Resp: normal respiratory effort Cardio: regular rate and rhythm Back: symmetric, no curvature. ROM normal. No CVA tenderness. GI: soft, non-tender; bowel sounds normal; no masses,  no organomegaly Male genitalia: penis: normal male phallus with no lesions or discharge.Testes: bilaterally descended he has an area of fluctuance on the lateral aspect of the scrotum on the left-hand side. Palpation results in purulent drainage and he is slightly tender. There is no crepitus to suggest Fournier's gangrene. Extremities: extremities a fresh surgical site with a wound VAC in place on his left lower extremity. Skin: Skin color normal. No visible rashes or lesions Neurologic: Grossly normal  Laboratory Data:   Basename 10/30/11 1200 10/29/11 2027  WBC 9.0 10.0  HGB 9.3* 10.4*  HCT 31.1* 34.0*   BMET  Basename 10/30/11 1200 10/29/11 2027  NA 142 139  K 3.3* 3.4*  CL 103 102  CO2 26 25  GLUCOSE 60* 92  BUN 26* 21  CREATININE 4.20* 3.65*  CALCIUM 9.0 9.1   No results found for this basename: LABPT:3,INR:3 in the last 72 hours No results found for this basename: LABURIN:1 in the last 72 hours Results for orders placed during the hospital encounter of 10/29/11  SURGICAL PCR SCREEN      Status: Normal   Collection Time   10/30/11  6:48 AM      Component Value Range Status Comment   MRSA, PCR NEGATIVE  NEGATIVE Final    Staphylococcus aureus NEGATIVE  NEGATIVE Final    Creatinine:  Basename 10/30/11 1200 10/29/11 2027  CREATININE 4.20* 3.65*    Imaging: I personally reviewed images of his CT scan done in 6/13 which reveals extensive bilateral renal cysts with some calcifications of the wall. The bladder appears normal as does the prostate. Impression/Assessment:  Left hemiscrotal abscess. He has no fever and has a normal white count. The abscess requires drainage. I have discussed performing this at the bedside with the patient.  Plan:  1. Drainage, irrigation  and packing of his left scrotal abscess will be performed tonight. 2. He will need to undergo dressing changes and wound care. 3. His abscess fluid will be sent for culture and sensitivity in order to direct antibiotic therapy.  Garnett Farm 10/30/2011, 4:41 PM

## 2011-10-30 NOTE — Progress Notes (Signed)
INITIAL ADULT NUTRITION ASSESSMENT Date: 10/30/2011   Time: 3:00 PM Reason for Assessment: Nutrition Risk  INTERVENTION: 1. Nepro Shake, BID, provides 425 kcal and 19 gm protein per 8 oz  2. Encouraged intake of high protein foods 3. RD will continue to follow    ASSESSMENT: Male 61 y.o.  Dx: Scrotal abscess  Hx:  Past Medical History  Diagnosis Date  . Hyperlipidemia   . ESRD (end stage renal disease)     a. MWF dialysis in [place] (followed by Dr. Kristian Covey)  . Arthritis   . Claudication   . Stroke 2005  . Anemia   . Chronic diastolic CHF (congestive heart failure) 07/2010    a. 07/2008 Echo EF 50-55%, mild-mod LVH, Gr 1 DD.  Marland Kitchen Peripheral vascular occlusive disease     a. 07/2011 Periph Angio: No signif Ao-illiac dzs, LCF stenosis, 100% LSFA w recon above knee pop and 3 vessel runoff, 100% RSFA w/ recon above knee --> pending L Fem to below Knee pop bypass.  Marland Kitchen History of tobacco abuse   . Hypertension     takes Amlodipine,Metoprolol,and Prinivil daily  . Dyspnea on exertion     with exertion  . GERD (gastroesophageal reflux disease)   . Cataract     bilateral  . Dialysis patient     Related Meds:     . ampicillin-sulbactam (UNASYN) IV  3 g Intravenous Once  . ciprofloxacin  400 mg Intravenous Once  . gabapentin  200 mg Oral Daily  . HYDROcodone-acetaminophen  1 tablet Oral Once  . HYDROmorphone      . HYDROmorphone  1 mg Intravenous Once  . isosorbide mononitrate  30 mg Oral Daily  . metroNIDAZOLE  500 mg Oral Q8H  . ondansetron  4 mg Intravenous Once  . piperacillin-tazobactam (ZOSYN)  IV  2.25 g Intravenous Q8H  . sodium chloride  3 mL Intravenous Q12H  . vancomycin  125 mg Oral QID   Followed by  . vancomycin  125 mg Oral BID   Followed by  . vancomycin  125 mg Oral Daily   Followed by  . vancomycin  125 mg Oral QODAY   Followed by  . vancomycin  125 mg Oral Q3 days  . vancomycin  500 mg Intravenous Once  . vancomycin  750 mg Intravenous Q M,W,F-HD  .  vancomycin  1,000 mg Intravenous Once     Ht: 5\' 6"  (167.6 cm)  Wt: 134 lb 14.7 oz (61.2 kg)  Ideal Wt: 64.5 kg % Ideal Wt: 95%  Usual Wt:  Wt Readings from Last 10 Encounters:  10/30/11 134 lb 14.7 oz (61.2 kg)  10/30/11 134 lb 14.7 oz (61.2 kg)  10/29/11 143 lb (64.864 kg)  10/22/11 130 lb (58.968 kg)  10/13/11 130 lb 4.7 oz (59.1 kg)  09/26/11 130 lb 4.7 oz (59.1 kg)  09/15/11 135 lb 12.9 oz (61.6 kg)  09/15/11 135 lb 12.9 oz (61.6 kg)  09/15/11 135 lb 12.9 oz (61.6 kg)  08/23/11 147 lb 11.3 oz (67 kg)   Usual weight is variable, ranging from 135-155 lbs % Usual Wt: 99%  Body mass index is 21.78 kg/(m^2). Pt is WNL based on current BMI   Food/Nutrition Related Hx: unintentional weight loss and problems chewing or swallowing per nutrition risk assessment   Labs:  CMP     Component Value Date/Time   NA 142 10/30/2011 1200   K 3.3* 10/30/2011 1200   CL 103 10/30/2011 1200   CO2 26 10/30/2011  1200   GLUCOSE 60* 10/30/2011 1200   BUN 26* 10/30/2011 1200   CREATININE 4.20* 10/30/2011 1200   CALCIUM 9.0 10/30/2011 1200   PROT 5.1* 10/12/2011 0602   ALBUMIN 1.5* 10/13/2011 1352   AST 23 10/12/2011 0602   ALT 8 10/12/2011 0602   ALKPHOS 103 10/12/2011 0602   BILITOT 0.2* 10/12/2011 0602   GFRNONAA 14* 10/30/2011 1200   GFRAA 16* 10/30/2011 1200     Intake/Output Summary (Last 24 hours) at 10/30/11 1503 Last data filed at 10/30/11 0925  Gross per 24 hour  Intake    400 ml  Output      0 ml  Net    400 ml     Diet Order: Renal  Supplements/Tube Feeding: none   IVF:    Estimated Nutritional Needs:   Kcal: 1900-2100 Protein: 80-90 gm  Fluid: 1.9-2.1 L   Pt with chronic stage 4 sacral wound, deep tissue injury on heel, and non-healing right foot wound from previous surgery.  Pt reports hx of poor appetite and weight loss, but now eating well and gaining weight. Unclear at this time what his true dry weight is, no HD notes available at this time to review.  Pt reports he eats  high protein foods. Is willing to drink Nepro shakes to improve protein intake to help with wound healing.   NUTRITION DIAGNOSIS: -Increased nutrient needs (NI-5.1).  Status: Ongoing  RELATED TO: wounds and ESRD with HD  AS EVIDENCE BY: estimated nutrition needs   MONITORING/EVALUATION(Goals): Goal: PO intake of high protein foods and beverages to promote wound healing  Monitor: PO intake, weight, wound healing   EDUCATION NEEDS: -No education needs identified at this time   DOCUMENTATION CODES Per approved criteria  -Not Applicable   Clarene Duke RD, LDN Pager [phone] After Hours pager [phone]

## 2011-10-30 NOTE — Progress Notes (Signed)
[facility] PROGRESS NOTE  [name] UJW:[id number] DOB: 04/03/1950 DOA: 10/29/2011 PCP: Toma Deiters, MD  Assessment/Plan: Principal Problem:  *Scrotal abscess Active Problems:  CKD (chronic kidney disease) stage V requiring chronic dialysis  Hypertension  Peripheral vascular disease, unspecified  C. difficile colitis- recalcitrant  Non-healing surgical wound  Diarrhea  1. Scrotal Abscess: Urology consulted. Will likely need I & D. Urology consulted. Continue current antibiotics. 2. ESRD: HD M/W/F continue per nephrology 3. PVD/ nonhealing surgical wound: Pt S/P debridement and placemnt of wound vac. 4. Abdominal abscess/Fluid collection: Will obtain CT abdomen and pelvis. Will likely need surgical consult. 5. Diarrhea: The patient presented with some complaints of diarrhea. He's had a history of recalcitrant C. difficile colitis and was empirically started on Flagyl and vancomycin. We will send stool for C. difficile and adjust therapies accordingly  Code Status: Full Code Family Communication: N/A Disposition Plan: Not yet determined  Reena Borromeo A.  [facility] Pager [phone]. If 8PM-8AM, please contact night-coverage at [email or web address], password [facility] 10/30/2011, 8:25 PM  LOS: 1 day   Brief narrative: [name] is a 61 year old male with end stage renal disease and peripheral vascular disease presented to the emergency room at [facility] with a four-day history of left hemiscrotal pain. He was seen and evaluated there. He states that he has had a previous scrotal abscess that he described as a "boil" that he popped himself in the past and which healed completly. He complains of discomfort with any movement of the scrotum, however he has not had any fevers.   Consultants:  Dr. Myra Gianotti- [facility]  Urology  Procedures:  S/P shunt transmetatarsal amputation and placement of wound VAC  Antibiotics:  Flagyl 8/8 >>  Oral Vancomycin 8/8 >>  IV  vancomycin 8/8 >>  HPI/Subjective: Patient with no complaints except for pain in scrotum.  Objective: Filed Vitals:   10/30/11 1000 10/30/11 1015 10/30/11 1035 10/30/11 1500  BP:   132/68 146/73  Pulse: 77 77 79 143  Temp:   97.6 F (36.4 C) 98.2 F (36.8 C)  TempSrc:    Oral  Resp: 11 12 16 18   Height:      Weight:      SpO2: 100% 100% 100% 93%   Weight change:   Intake/Output Summary (Last 24 hours) at 10/30/11 2025 Last data filed at 10/30/11 1827  Gross per 24 hour  Intake    800 ml  Output      0 ml  Net    800 ml    General: Alert, awake, oriented x3, in no acute distress.  HEENT: [place]/AT PEERL, EOMI Neck: Trachea midline,  no masses, no thyromegal,y no JVD, no carotid bruit OROPHARYNX:  Moist, No exudate/ erythema/lesions.  Heart: Regular rate and rhythm, without murmurs, rubs, gallops, PMI non-displaced, no heaves or thrills on palpation.  Lungs: Clear to auscultation, no wheezing or rhonchi noted. No increased vocal fremitus resonant to percussion  Abdomen: Soft. Patient has an area in the left lower quadrant that appears to be a draining abscess. Upon palpation the patient had spotting of fluid out of the boil/draining abscess Musculoskeletal: No warm swelling or erythema around joints, no spinal tenderness noted. Psychiatric: Patient alert and oriented x3, good insight and cognition, good recent to remote recall. Genitalia: Patient has a left draining scrotal abscess.   Data Reviewed: Basic Metabolic Panel:  Lab 10/30/11 6578 10/29/11 2027  NA 142 139  K 3.3* 3.4*  CL 103 102  CO2 26 25  GLUCOSE  60* 92  BUN 26* 21  CREATININE 4.20* 3.65*  CALCIUM 9.0 9.1  MG -- --  PHOS -- --   Liver Function Tests: No results found for this basename: AST:5,ALT:5,ALKPHOS:5,BILITOT:5,PROT:5,ALBUMIN:5 in the last 168 hours No results found for this basename: LIPASE:5,AMYLASE:5 in the last 168 hours No results found for this basename: AMMONIA:5 in the last 168  hours CBC:  Lab 10/30/11 1200 10/29/11 2027  WBC 9.0 10.0  NEUTROABS -- 7.7  HGB 9.3* 10.4*  HCT 31.1* 34.0*  MCV 102.0* 102.7*  PLT 193 184   Cardiac Enzymes: No results found for this basename: CKTOTAL:5,CKMB:5,CKMBINDEX:5,TROPONINI:5 in the last 168 hours BNP (last 3 results)  Basename 08/28/11 0540 08/27/11 0930 08/26/11 2305  PROBNP 21860.0* 19652.0* 25900.0*   CBG: No results found for this basename: GLUCAP:5 in the last 168 hours  Recent Results (from the past 240 hour(s))  SURGICAL PCR SCREEN     Status: Normal   Collection Time   10/30/11  6:48 AM      Component Value Range Status Comment   MRSA, PCR NEGATIVE  NEGATIVE Final    Staphylococcus aureus NEGATIVE  NEGATIVE Final      Studies: Portable Chest 1 View  10/12/2011  *RADIOLOGY REPORT*  Clinical Data: SOB, evaluate for pneumonia  PORTABLE CHEST - 1 VIEW  Comparison: 10/11/2011  Findings: Cardiomegaly with mild interstitial edema and moderate right pleural effusion.  Suspected small left pleural effusion.  Associated right lower lobe opacity, likely atelectasis, underlying pneumonia not excluded.  No pneumothorax.  Stable left IJ venous catheter.  IMPRESSION: Cardiomegaly with mild interstitial edema and moderate right pleural effusion.  Suspected small left pleural effusion.  Associate right lower lobe opacity, likely atelectasis, underlying pneumonia not excluded.  Original Report Authenticated By: Charline Bills, M.D.   Dg Abd Acute W/chest  10/11/2011  *RADIOLOGY REPORT*  Clinical Data: Abdominal pain, nausea and vomiting.  ACUTE ABDOMEN SERIES (ABDOMEN 2 VIEW & CHEST 1 VIEW)  Comparison: Abdominal radiographs 08/28/2011.  Chest x-ray 09/10/2011.  Findings: Left internal jugular PermCath with the tips terminating in the right atrium and at the superior cavoatrial junction is unchanged.  Lung volumes are low.  Linear opacities in the left mid and lower lung are most consistent with atelectasis.  Patchy airspace  consolidation and volume loss in the right middle and lower lobes.  Trace left and small right-sided pleural effusions. No pneumoperitoneum.  Supine and upright views of the abdomen demonstrate gas and stool scattered throughout the colon extending to the level of the distal rectum.  No pathologic distension of small bowel is noted.  Midline surgical staples are again noted.  Numerous vascular calcifications.  In addition, there are numerous egg-shell type calcifications in the left upper quadrant of the abdomen which appear to correspond with numerous calcified cystic lesions based on comparison with prior CT scans.  IMPRESSION: 1.  Nonobstructive bowel gas pattern. 2.  No pneumoperitoneum. 3.  Support apparatus and postoperative changes, as above. 4.  Air space consolidation and atelectasis in the right middle and lower lobes, concerning for pneumonia and/or sequelae of aspiration. 5.  Small right and trace left-sided pleural effusions. 6.  Atherosclerosis.  Original Report Authenticated By: Florencia Reasons, M.D.   Dg Foot Complete Left  10/17/2011  *RADIOLOGY REPORT*  Clinical Data: Wound infection  LEFT FOOT - COMPLETE 3+ VIEW  Comparison: None.  Findings: The patient is status post amputation of the foot beyond the proximal aspect of the five metatarsals.  No acute  fracture. No dislocation.  No periosteal reaction.  No destructive bone lesion.  Vascular calcifications are prominent.  An open wound at the amputation site is noted.  IMPRESSION: Status post forefoot amputation.  Open wound is present at the amputation site.  No acute bony pathology.  Original Report Authenticated By: Donavan Burnet, M.D.    Scheduled Meds:   . ampicillin-sulbactam (UNASYN) IV  3 g Intravenous Once  . ciprofloxacin  400 mg Intravenous Once  . feeding supplement (NEPRO CARB STEADY)  237 mL Oral BID BM  . gabapentin  200 mg Oral Daily  . HYDROcodone-acetaminophen  1 tablet Oral Once  . HYDROmorphone      .  HYDROmorphone  1 mg Intravenous Once  . isosorbide mononitrate  30 mg Oral Daily  . lidocaine-EPINEPHrine  10 mL Intradermal Once  . metroNIDAZOLE  500 mg Oral Q8H  . ondansetron  4 mg Intravenous Once  . piperacillin-tazobactam (ZOSYN)  IV  2.25 g Intravenous Q8H  . sodium chloride  3 mL Intravenous Q12H  . vancomycin  125 mg Oral QID   Followed by  . vancomycin  125 mg Oral BID   Followed by  . vancomycin  125 mg Oral Daily   Followed by  . vancomycin  125 mg Oral QODAY   Followed by  . vancomycin  125 mg Oral Q3 days  . vancomycin  500 mg Intravenous Once  . vancomycin  750 mg Intravenous Q M,W,F-HD  . vancomycin  1,000 mg Intravenous Once   Continuous Infusions:   Principal Problem:  *Scrotal abscess Active Problems:  CKD (chronic kidney disease) stage V requiring chronic dialysis  Hypertension  Peripheral vascular disease, unspecified  C. difficile colitis- recalcitrant  Non-healing surgical wound  Diarrhea

## 2011-10-30 NOTE — Anesthesia Preprocedure Evaluation (Addendum)
Anesthesia Evaluation  Patient identified by MRN, date of birth, ID band Patient awake    Reviewed: Allergy & Precautions, H&P , NPO status , Patient's Chart, lab work & pertinent test results  Airway Mallampati: II  Neck ROM: Full    Dental  (+) Edentulous Upper, Edentulous Lower and Dental Advisory Given   Pulmonary shortness of breath,    + decreased breath sounds      Cardiovascular hypertension, + CAD and +CHF + dysrhythmias Rhythm:Regular Rate:Normal     Neuro/Psych    GI/Hepatic GERD-  ,  Endo/Other    Renal/GU CRF and DialysisRenal disease     Musculoskeletal   Abdominal   Peds  Hematology   Anesthesia Other Findings   Reproductive/Obstetrics                         Anesthesia Physical Anesthesia Plan  ASA: IV  Anesthesia Plan: General   Post-op Pain Management:    Induction: Intravenous  Airway Management Planned: LMA  Additional Equipment:   Intra-op Plan:   Post-operative Plan: Extubation in OR  Informed Consent: I have reviewed the patients History and Physical, chart, labs and discussed the procedure including the risks, benefits and alternatives for the proposed anesthesia with the patient or authorized representative who has indicated his/her understanding and acceptance.   Dental advisory given  Plan Discussed with: CRNA and Surgeon  Anesthesia Plan Comments:         Anesthesia Quick Evaluation

## 2011-10-30 NOTE — Preoperative (Signed)
Beta Blockers   Reason not to administer Beta Blockers:Not Applicable 

## 2011-10-30 NOTE — Interval H&P Note (Signed)
History and Physical Interval Note:  10/30/2011 7:33 AM  [name]  has presented today for surgery, with the diagnosis of nonhealing wound left transmetarsal amputation site  The various methods of treatment have been discussed with the patient and family. After consideration of risks, benefits and other options for treatment, the patient has consented to  Procedure(s) (LRB): IRRIGATION AND DEBRIDEMENT EXTREMITY (Left) APPLICATION OF WOUND VAC (Left) as a surgical intervention .  The patient's history has been reviewed, patient examined, no change in status, stable for surgery.  I have reviewed the patient's chart and labs.  Questions were answered to the patient's satisfaction.     Ambrie Carte IV, V. WELLS

## 2011-10-30 NOTE — H&P (Signed)
[facility] History and Physical  [name]:[id number] DOB: 04-01-1950 DOA: 10/30/2011    PCP:   Toma Deiters, MD  Vascular surgeon: Coral Else   Chief Complaint:  Draining inguinal wound x weeks Tender swelling left scrotum x 2 days  HPI: [name] is an 61 y.o. male.   With multiple medical problems, including end-stage renal disease dialyzed Monday Wednesday Friday, and peripheral vascular disease, and a sacral decubitus ulcer. In late May, 2013 he was hospitalized with septic shock related to C. difficile colitis; In early June he had a left femoropopliteal bypass for claudication; In later June he had left transmetatarsal amputation; He has had nonhealing of the left foot wound and is scheduled for wound VAC surgery this morning at [facility] with Dr. Myra Gianotti  . He has had incomplete healing of the left groin wound from his femoropopliteal surgery with chronic serous drainage for weeks, but for reasons unclear  came to the emergency room tonight to complain about the chronic inguinal drainage. While being examined he was found to have a draining abscess of the left scrotum, and he admitted that that has been there for 2 days, and that it burst why he was coming out of his car to come into the emergency room.  Additionally while in the emergency room he has had 7 loose watery stools. He reports that prior to coming to the emergency room he's been having one formed stool per day.  He denies fever or chills, he complains of chronic tailbone pain.  He ambulates with the assistance of an orthopedic boot on his left foot. Patient had normal dialysis earlier today  Rewiew of Systems:   All systems negative except as marked bold or noted in the HPI;  Constitutional: Negative for malaise, fever and chills. ;  Eyes: Negative for eye pain, redness and discharge. ;  ENMT: Negative for ear pain, hoarseness, nasal congestion, sinus pressure and sore  throat. ;  Cardiovascular: Negative for chest pain, palpitations, diaphoresis, dyspnea;  peripheral edema. ;  Respiratory: Negative for cough, hemoptysis, wheezing and stridor. ;  Gastrointestinal: Negative for nausea, vomiting, constipation, abdominal pain, melena, blood in stool, hematemesis, jaundice and rectal bleeding. unusual weight loss.  Diarrhea. Genitourinary: anuric;scrotal swelling and tenderness for 2 days Musculoskeletal: Negative for neck pain. Negative for swelling and trauma.;  back pain Skin: . Negative for pruritus, rash, abrasions, bruising and skin lesion.; ulcerations Neuro: Negative for headache, lightheadedness and neck stiffness. Negative for weakness, altered level of consciousness , altered mental status, extremity weakness, burning feet, involuntary movement, seizure and syncope.  Psych: negative for anxiety, depression, insomnia, tearfulness, panic attacks, hallucinations, paranoia, suicidal or homicidal ideation    Past Medical History  Diagnosis Date  . Hyperlipidemia   . ESRD (end stage renal disease)     a. MWF dialysis in [place] (followed by Dr. Kristian Covey)  . Arthritis   . Claudication   . Stroke 2005  . Anemia   . Chronic diastolic CHF (congestive heart failure) 07/2010    a. 07/2008 Echo EF 50-55%, mild-mod LVH, Gr 1 DD.  Marland Kitchen Peripheral vascular occlusive disease     a. 07/2011 Periph Angio: No signif Ao-illiac dzs, LCF stenosis, 100% LSFA w recon above knee pop and 3 vessel runoff, 100% RSFA w/ recon above knee --> pending L Fem to below Knee pop bypass.  Marland Kitchen History of tobacco abuse   . Hypertension     takes Amlodipine,Metoprolol,and Prinivil daily  . Dyspnea on  exertion     with exertion  . GERD (gastroesophageal reflux disease)   . Cataract     bilateral  . Dialysis patient     Past Surgical History  Procedure Date  . Arteriovenous graft placement   . Av fistula placement 12/10/2000    Right brachiocephalic arteriovenous   . Hernia repair   .  Aortagram 07/29/2011    Abdominal Aortagram  . Cardiac catheterization 08/01/11    Left heart catheterization  . Femoral-popliteal bypass graft 09/09/2011    Procedure: BYPASS GRAFT FEMORAL-POPLITEAL ARTERY;  Surgeon: [name], MD;  Location: [facility] OR;  Service: Vascular;  Laterality: Left;  . Amputation 09/12/2011    Procedure: AMPUTATION BELOW KNEE;  Surgeon: [name], MD;  Location: [facility] OR;  Service: Vascular;  Laterality: Left;  TRANSMETATARSAL    Medications:  HOME MEDS: Prior to Admission medications   Medication Sig Start Date End Date Taking? Authorizing Provider  cinacalcet (SENSIPAR) 30 MG tablet Take 2 tablets (60 mg total) by mouth daily with breakfast. 09/16/11 12/15/11 Yes Lars Mage, PA  gabapentin (NEURONTIN) 100 MG capsule Take 2 capsules (200 mg total) by mouth daily. 09/26/11  Yes Evlyn Kanner Love, PA  isosorbide mononitrate (IMDUR) 30 MG 24 hr tablet Take 30 mg by mouth daily.   Yes Historical Provider, MD  multivitamin (RENA-VIT) TABS tablet Take 1 tablet by mouth daily.   Yes Historical Provider, MD  simvastatin (ZOCOR) 5 MG tablet Take 5 mg by mouth at bedtime.   Yes Historical Provider, MD     Allergies:  No Known Allergies  Social History:   reports that he quit smoking about 7 months ago. His smoking use included Cigarettes. He has a 40 pack-year smoking history. He does not have any smokeless tobacco history on file. He reports that he does not drink alcohol or use illicit drugs.  Family History: Family History  Problem Relation Age of Onset  . Breast cancer Mother   . Cancer Father   . Anesthesia problems Neg Hx   . Hypotension Neg Hx   . Malignant hyperthermia Neg Hx   . Pseudochol deficiency Neg Hx      Physical Exam: Filed Vitals:   10/29/11 2115 10/29/11 2342 10/30/11 0132 10/30/11 0156  BP: 120/59   128/58  Pulse: 75  82   Temp:  100.1 F (37.8 C)    TempSrc:  Rectal    Resp: 18  16   Height:      Weight:      SpO2: 96%  97% 93%     Blood pressure 128/58, pulse 82, temperature 100.1 F (37.8 C), temperature source Rectal, resp. rate 16, height 5\' 6"  (1.676 m), weight 66.225 kg (146 lb), SpO2 93.00%.  GEN:  Pleasant middle-aged African American gentleman lying in the stretcher complaining of rump pain; looks older than his stated age ;cooperative with exam PSYCH:  alert and oriented x4;  affect is appropriate. HEENT: Mucous membranes pink and anicteric; PERRLA; EOM intact; no cervical lymphadenopathy nor thyromegaly or carotid bruit;  Breasts:: Not examined CHEST WALL: No tenderness; muscle wasting of chest CHEST: Normal respiration, clear to auscultation bilaterally HEART: Regular rate and rhythm; no murmurs rubs or gallops BACK: ; no CVA tenderness; stage III sacral decubitus ulcer ABDOMEN: , soft non-tender; no masses, no organomegaly, normal abdominal bowel sounds; no intertriginous candida. Rectal Exam: Not done EXTREMITIES: Healed saphenous vein scar along left lower leg and thigh; incomplete healing of scarring upper thigh and  left groin; clear serous drainage possibly lymph; 1+ edema of both legs to knees; transmetatarsal amputation covered by a bandage with serous yellow discharge through bandage;Marland Kitchen Genitalia: Normal-appearing penis; 3 cm indurated area left lower scrotum, no drainage noted at this time. SKIN: Normal hydration no rash or ulceration other than noted above. CNS: Cranial nerves 2-12 grossly intact no focal lateralizing neurologic deficit elicited   Labs on Admission:  Basic Metabolic Panel:  Lab 10/29/11 4696  NA 139  K 3.4*  CL 102  CO2 25  GLUCOSE 92  BUN 21  CREATININE 3.65*  CALCIUM 9.1  MG --  PHOS --   Liver Function Tests: No results found for this basename: AST:5,ALT:5,ALKPHOS:5,BILITOT:5,PROT:5,ALBUMIN:5 in the last 168 hours No results found for this basename: LIPASE:5,AMYLASE:5 in the last 168 hours No results found for this basename: AMMONIA:5 in the last 168  hours CBC:  Lab 10/29/11 2027  WBC 10.0  NEUTROABS 7.7  HGB 10.4*  HCT 34.0*  MCV 102.7*  PLT 184   Cardiac Enzymes: No results found for this basename: CKTOTAL:5,CKMB:5,CKMBINDEX:5,TROPONINI:5 in the last 168 hours BNP: No components found with this basename: POCBNP:5 CBG: No results found for this basename: GLUCAP:5 in the last 168 hours  Results for orders placed during the hospital encounter of 10/29/11 (from the past 48 hour(s))  CBC WITH DIFFERENTIAL     Status: Abnormal   Collection Time   10/29/11  8:27 PM      Component Value Range Comment   WBC 10.0  4.0 - 10.5 K/uL    RBC 3.31 (*) 4.22 - 5.81 MIL/uL    Hemoglobin 10.4 (*) 13.0 - 17.0 g/dL    HCT 29.5 (*) 28.4 - 52.0 %    MCV 102.7 (*) 78.0 - 100.0 fL    MCH 31.4  26.0 - 34.0 pg    MCHC 30.6  30.0 - 36.0 g/dL    RDW 13.2 (*) 44.0 - 15.5 %    Platelets 184  150 - 400 K/uL    Neutrophils Relative 78 (*) 43 - 77 %    Neutro Abs 7.7  1.7 - 7.7 K/uL    Lymphocytes Relative 12  12 - 46 %    Lymphs Abs 1.3  0.7 - 4.0 K/uL    Monocytes Relative 7  3 - 12 %    Monocytes Absolute 0.7  0.1 - 1.0 K/uL    Eosinophils Relative 2  0 - 5 %    Eosinophils Absolute 0.2  0.0 - 0.7 K/uL    Basophils Relative 1  0 - 1 %    Basophils Absolute 0.1  0.0 - 0.1 K/uL   BASIC METABOLIC PANEL     Status: Abnormal   Collection Time   10/29/11  8:27 PM      Component Value Range Comment   Sodium 139  135 - 145 mEq/L    Potassium 3.4 (*) 3.5 - 5.1 mEq/L    Chloride 102  96 - 112 mEq/L    CO2 25  19 - 32 mEq/L    Glucose, Bld 92  70 - 99 mg/dL    BUN 21  6 - 23 mg/dL    Creatinine, Ser 1.02 (*) 0.50 - 1.35 mg/dL    Calcium 9.1  8.4 - 72.5 mg/dL    GFR calc non Af Amer 16 (*) >90 mL/min    GFR calc Af Amer 19 (*) >90 mL/min      Radiological Exams on Admission: No results found.  Assessment/Plan Present on Admission:  .Scrotal abscess, in a gentleman with compromised immune system .Hypertension . history of C. difficile  colitis- recalcitrant, leading to septic shock  . new-onset Diarrhea or gentleman with multiple recent hospital admission and extend that antibiotic use. .Non-healing surgical wound, left foot  .Peripheral vascular disease, unspecified .CKD (chronic kidney disease) stage V requiring chronic dialysis Chronic Sacral decubitus ulcer  PLAN: This gentleman is quite complicated: He needs intravenous antibiotic therapy to insure non-progression of his scrotal abscess, and he needs to see a urologist possibly for incision and drainage of the abscess. No urologist is available at [facility] at this time; additionally he is scheduled to see his vascular surgeon tomorrow morning for a procedure on his non-healing left foot, and he needs to have regular dialysis.  We will admit him to [facility] to allow him to see his vascular surgeon, and get dialysis, and we'll arrange a urology consult.  We will start treatment empirically for C. difficile colitis, pending results of C. differential PCR; because of past history of recalcitrance, will use both Flagyl and vancomycin orally. He will likely benefit from oral probiotics after his surgery.  We'll give vancomycin and Zosyn intravenously for scrotal abscess.  Will not yet start anticoagulant DVT prophylaxis, since it is a possibility he'll be having surgery later this morning   Other plans as per orders.  Code Status: To be re-verified Family Communication:  Disposition Plan: Pending   Tyniya Kuyper Nocturnist [facility] Pager [phone]   10/30/2011, 2:50 AM

## 2011-10-30 NOTE — Op Note (Signed)
PATIENT:  Len Blalock [place]  PRE-OPERATIVE DIAGNOSIS: Scrotal abscess  POST-OPERATIVE DIAGNOSIS:  Same  PROCEDURE:  Procedure(s): Drainage of scrotal abscess  SURGEON:  Garnett Farm  INDICATION: Mr. Craver is a 61 year old male with a four-day history of scrotal abscess. It is draining. When fluid.  ANESTHESIA:  Local  EBL:  Minimal  LOCAL MEDICATIONS USED:  1% lidocaine with epinephrine  SPECIMEN:  Wound culture  DISPOSITION OF SPECIMEN:  For culture and sensitivity  Description of operation: The patient bed I closed the scrotum with alcohol and then injected the local anesthetic into the subcutaneous tissue. After allowing adequate time for epinephrine effect I then cleansed the scrotum again with Betadine and draped the area. An incision was made in the area of greatest fluctuance with a scalpel. I made an incision approximately 2 cm long. Grossly purulent fluid drained from this and a wound culture was obtained from the. Fluid.  I irrigated the wound with sterile saline. I then probed the wound with hemostats to break up any loculations. I then explored the wound digitally to make sure there were no further loculations or areas of fluctuance and none were noted. I therefore irrigated once again and packed the incision with half inch iodoform gauze. A sterile gauze dressing was applied and this was secured with mesh panties. The patient tolerated the procedure well and there were no complications.

## 2011-10-30 NOTE — H&P (View-Only) (Signed)
Subjective:     Patient ID: [name], male   DOB: 06/14/1950, 61 y.o.   MRN: [id number]  HPI: 61-year-old patient of Dr. Brabham who is status post a left lower extremity femoropopliteal bypass in early June and a subsequent left transmetatarsal amputation on 09/12/2011. The patient's been following up with Dr. Parker at the wound center and at his last appointment there was referred to the emergency room for possible infection and gangrenous tissue. The patient did go to the [facility] emergency room and was subsequently sent home. The patient's niece called today asking for a reevaluation do to pain and necrotic tissue around the wound.   Review of Systems: 12 point review of systems is notable for the difficulties described above otherwise unremarkable     Objective:   Physical Exam: Afebrile, vital signs are stable, there is an eschar and gangrenous tissue around the surgical site. The surgical itself is dry and not healing very well. The patient has a lot of pain with any manipulation.     Assessment:     Nonhealing left transmetatarsal amputation, the patient was seen with Dr. Dickson who explained to the patient and his family that we will have to obtain a graft duplex and ABIs today to determine the next move with treatment. The graft is patent per the duplex today.    Plan:     Per Dr. Dickson, I explained to the patient and his family that Dr. Dickson will discuss the case with Dr. Brabham tomorrow and someone from his office will contact them ASAP with his disposition and plan. The patient and his family state understanding. I did issue a prescription for Percocet 07/25/2023 one by mouth every 6 hours when necessary 30 with no refill .  Meril Dray ANP  Clinic M.D.: Dickson      

## 2011-10-30 NOTE — Op Note (Signed)
[facility] [place]  Patient name: [name] MRN: [id number] DOB: 1951/01/26 Sex: male  10/29/2011 - 10/30/2011 Pre-operative Diagnosis: Non-healing left transmetatarsal amputation site Post-operative diagnosis:  Same Surgeon:  Jorge Ny Assistants:  None Procedure:   #1.) Revision of left transmetatarsal amputation #2.) Placement of wound VAC. Anesthesia:  Gen. Blood Loss:  See anesthesia record Specimens:  Portion of her left foot  Findings:  Marginal bone. I debrided back the transmetatarsal amputation which included removal of another centimeter of bone on all 5 digits.  Indications:  The patient has previously undergone surgical revascularization and transmetatarsal amputation site. He was seen in the office and found to have a nonhealing wound with bone exposed. He comes in today for debridement possible revision.  Procedure:  The patient was identified in the holding area and [facility] ROOM 16  The patient was then placed supine on the table. general anesthesia was administered.  The patient was prepped and draped in the usual sterile fashion.  A time out was called and antibiotics were administered.  I used a #10 blade to remove some of the necrotic tissue. There was no purulence present. There was bone exposed on the fifth transmetatarsal site. I used a oscillating saw after I had elevated the periosteum and transected and shortened the amputation site which removed a portion of all 5 transmetatarsal bones. I then used a rasp to smooth out the bone surfaces. I did use a Roger to remove the distal portion of the bone. The bone was marginal at amputation site but it could not be revised any further proximal. The tissue appeared to be healthy. All nonviable tissue was removed sharply. There was then irrigated. I elected to close the wound by reapproximating it with 3 3-0 nylon sutures and then placing a wound VAC.   Disposition:  To PACU in stable  condition.   Juleen China, M.D. [facility] [place] Office: [phone] Pager:  [phone]

## 2011-10-30 NOTE — Anesthesia Postprocedure Evaluation (Signed)
  Anesthesia Post-op Note  Patient: [name]  Procedure(s) Performed: Procedure(s) (LRB): TRANSMETATARSAL AMPUTATION (Left) APPLICATION OF WOUND VAC (Left)  Patient Location: PACU  Anesthesia Type: General  Level of Consciousness: awake and sedated  Airway and Oxygen Therapy: Patient Spontanous Breathing  Post-op Pain: mild  Post-op Assessment: Post-op Vital signs reviewed  Post-op Vital Signs: stable  Complications: No apparent anesthesia complications

## 2011-10-30 NOTE — Transfer of Care (Signed)
Immediate Anesthesia Transfer of Care Note  Patient: [name]  Procedure(s) Performed: Procedure(s) (LRB): TRANSMETATARSAL AMPUTATION (Left) APPLICATION OF WOUND VAC (Left)  Patient Location: PACU  Anesthesia Type: General  Level of Consciousness: awake, alert  and oriented  Airway & Oxygen Therapy: Patient Spontanous Breathing and Patient connected to nasal cannula oxygen  Post-op Assessment: Report given to PACU RN  Post vital signs: Reviewed and stable  Complications: No apparent anesthesia complications

## 2011-10-31 ENCOUNTER — Encounter (HOSPITAL_COMMUNITY): Payer: Self-pay | Admitting: Radiology

## 2011-10-31 ENCOUNTER — Ambulatory Visit: Payer: Medicare Other | Admitting: Vascular Surgery

## 2011-10-31 ENCOUNTER — Inpatient Hospital Stay (HOSPITAL_COMMUNITY): Payer: Medicare Other

## 2011-10-31 DIAGNOSIS — S88919A Complete traumatic amputation of unspecified lower leg, level unspecified, initial encounter: Secondary | ICD-10-CM

## 2011-10-31 DIAGNOSIS — L03319 Cellulitis of trunk, unspecified: Secondary | ICD-10-CM

## 2011-10-31 LAB — BASIC METABOLIC PANEL
Chloride: 102 mEq/L (ref 96–112)
GFR calc Af Amer: 13 mL/min — ABNORMAL LOW (ref >90)
GFR calc non Af Amer: 11 mL/min — ABNORMAL LOW (ref >90)
Glucose, Bld: 113 mg/dL — ABNORMAL HIGH (ref 70–99)
Potassium: 3.5 mEq/L (ref 3.5–5.1)
Sodium: 141 mEq/L (ref 135–145)

## 2011-10-31 LAB — CBC WITH DIFFERENTIAL/PLATELET
Eosinophils Absolute: 0 10*3/uL (ref 0.0–0.7)
Lymphs Abs: 1.2 10*3/uL (ref 0.7–4.0)
MCH: 30.9 pg (ref 26.0–34.0)
Neutro Abs: 6.4 10*3/uL (ref 1.7–7.7)
Neutrophils Relative %: 78 % — ABNORMAL HIGH (ref 43–77)
Platelets: 223 10*3/uL (ref 150–400)
RBC: 2.69 MIL/uL — ABNORMAL LOW (ref 4.22–5.81)
WBC: 8.2 10*3/uL (ref 4.0–10.5)

## 2011-10-31 MED ORDER — HEPARIN SODIUM (PORCINE) 1000 UNIT/ML DIALYSIS
20.0000 [IU]/kg | INTRAMUSCULAR | Status: DC | PRN
  Filled 2011-10-31: qty 2

## 2011-10-31 MED ORDER — DARBEPOETIN ALFA-POLYSORBATE 100 MCG/0.5ML IJ SOLN
100.0000 ug | INTRAMUSCULAR | Status: DC
  Administered 2011-11-03: 100 ug via INTRAVENOUS
  Filled 2011-10-31: qty 0.5

## 2011-10-31 MED ORDER — IOHEXOL 300 MG/ML  SOLN
80.0000 mL | Freq: Once | INTRAMUSCULAR | Status: AC | PRN
  Administered 2011-10-31: 80 mL via INTRAVENOUS

## 2011-10-31 NOTE — Consult Note (Signed)
Reason for Consult: To provide dialysis and dialysis related needs Referring Physician: Dr. Marthann Schiller  [name] is an 61 y.o.black male with past medical history significant for hypertension, history of tobacco abuse, diffuse vascular disease including most significantly peripheral vascular disease status post many procedures. He also has end-stage renal disease and dialyzes Mondays Wednesdays and Fridays in the  [facility] unit and is followed by Dr. Dr. Fausto Skillern.  He was admitted most recently for a left groin wound from a previous revascularization procedure. He was discharged on 10/14/2011. He now presents with a nonhealing wound on his left foot.  He is status post application of the wound VAC per Dr. Myra Gianotti to this nonhealing left transmetatarsal wound. He states that he feels great. He had his regular hemodialysis as an outpatient on Wednesday. I was called this evening to arrange his dialysis. He is normally on a Monday Wednesday Friday schedule. Because of the late hour and that he does not need dialysis this evening we will arrange it for tomorrow. He has been discovered during hospitalization and also a scrotal abscess and intra-abdominal abscess. Other complication his diarrhea.   Dialyzes at [facility] unit Primary Nephrologist Dr. Fausto Skillern. EDW unknown. HD [place] unknown, Dialyzer unknown, Heparin unknown at this time. Access left sided PermCath.  Past Medical History  Diagnosis Date  . Hyperlipidemia   . ESRD (end stage renal disease)     a. MWF dialysis in [place] (followed by Dr. Kristian Covey)  . Arthritis   . Claudication   . Stroke 2005  . Anemia   . Chronic diastolic CHF (congestive heart failure) 07/2010    a. 07/2008 Echo EF 50-55%, mild-mod LVH, Gr 1 DD.  Marland Kitchen Peripheral vascular occlusive disease     a. 07/2011 Periph Angio: No signif Ao-illiac dzs, LCF stenosis, 100% LSFA w recon above knee pop and 3 vessel runoff, 100% RSFA w/ recon above knee --> pending L Fem to  below Knee pop bypass.  Marland Kitchen History of tobacco abuse   . Hypertension     takes Amlodipine,Metoprolol,and Prinivil daily  . Dyspnea on exertion     with exertion  . GERD (gastroesophageal reflux disease)   . Cataract     bilateral  . Dialysis patient     Past Surgical History  Procedure Date  . Arteriovenous graft placement   . Av fistula placement 12/10/2000    Right brachiocephalic arteriovenous   . Hernia repair   . Aortagram 07/29/2011    Abdominal Aortagram  . Cardiac catheterization 08/01/11    Left heart catheterization  . Femoral-popliteal bypass graft 09/09/2011    Procedure: BYPASS GRAFT FEMORAL-POPLITEAL ARTERY;  Surgeon: [name], MD;  Location: [facility] OR;  Service: Vascular;  Laterality: Left;  . Amputation 09/12/2011    Procedure: AMPUTATION BELOW KNEE;  Surgeon: [name], MD;  Location: [facility] OR;  Service: Vascular;  Laterality: Left;  TRANSMETATARSAL  . Application of wound vac 10/30/2011    Procedure: APPLICATION OF WOUND VAC;  Surgeon: [name], MD;  Location: [facility] OR;  Service: Vascular;  Laterality: Left;    Family History  Problem Relation Age of Onset  . Breast cancer Mother   . Cancer Father   . Anesthesia problems Neg Hx   . Hypotension Neg Hx   . Malignant hyperthermia Neg Hx   . Pseudochol deficiency Neg Hx     Social History:  reports that he quit smoking about 7 months ago. His smoking use included Cigarettes. He  has a 40 pack-year smoking history. He does not have any smokeless tobacco history on file. He reports that he does not drink alcohol or use illicit drugs.  Allergies: No Known Allergies  Medications: I have reviewed the patient's current medications.  Dialysis medications are also unknown at this time   Results for orders placed during the hospital encounter of 10/29/11 (from the past 48 hour(s))  STOOL CULTURE     Status: Normal (Preliminary result)   Collection Time   10/29/11  8:12 PM      Component Value Range Comment    Specimen Description STOOL      Special Requests NONE      Culture Culture reincubated for better growth      Report Status PENDING     CBC WITH DIFFERENTIAL     Status: Abnormal   Collection Time   10/29/11  8:27 PM      Component Value Range Comment   WBC 10.0  4.0 - 10.5 K/uL    RBC 3.31 (*) 4.22 - 5.81 MIL/uL    Hemoglobin 10.4 (*) 13.0 - 17.0 g/dL    HCT 13.0 (*) 86.5 - 52.0 %    MCV 102.7 (*) 78.0 - 100.0 fL    MCH 31.4  26.0 - 34.0 pg    MCHC 30.6  30.0 - 36.0 g/dL    RDW 78.4 (*) 69.6 - 15.5 %    Platelets 184  150 - 400 K/uL    Neutrophils Relative 78 (*) 43 - 77 %    Neutro Abs 7.7  1.7 - 7.7 K/uL    Lymphocytes Relative 12  12 - 46 %    Lymphs Abs 1.3  0.7 - 4.0 K/uL    Monocytes Relative 7  3 - 12 %    Monocytes Absolute 0.7  0.1 - 1.0 K/uL    Eosinophils Relative 2  0 - 5 %    Eosinophils Absolute 0.2  0.0 - 0.7 K/uL    Basophils Relative 1  0 - 1 %    Basophils Absolute 0.1  0.0 - 0.1 K/uL   BASIC METABOLIC PANEL     Status: Abnormal   Collection Time   10/29/11  8:27 PM      Component Value Range Comment   Sodium 139  135 - 145 mEq/L    Potassium 3.4 (*) 3.5 - 5.1 mEq/L    Chloride 102  96 - 112 mEq/L    CO2 25  19 - 32 mEq/L    Glucose, Bld 92  70 - 99 mg/dL    BUN 21  6 - 23 mg/dL    Creatinine, Ser 2.95 (*) 0.50 - 1.35 mg/dL    Calcium 9.1  8.4 - 28.4 mg/dL    GFR calc non Af Amer 16 (*) >90 mL/min    GFR calc Af Amer 19 (*) >90 mL/min   SURGICAL PCR SCREEN     Status: Normal   Collection Time   10/30/11  6:48 AM      Component Value Range Comment   MRSA, PCR NEGATIVE  NEGATIVE    Staphylococcus aureus NEGATIVE  NEGATIVE   BASIC METABOLIC PANEL     Status: Abnormal   Collection Time   10/30/11 12:00 PM      Component Value Range Comment   Sodium 142  135 - 145 mEq/L    Potassium 3.3 (*) 3.5 - 5.1 mEq/L    Chloride 103  96 - 112 mEq/L  CO2 26  19 - 32 mEq/L    Glucose, Bld 60 (*) 70 - 99 mg/dL    BUN 26 (*) 6 - 23 mg/dL    Creatinine, Ser 5.78 (*)  0.50 - 1.35 mg/dL    Calcium 9.0  8.4 - 46.9 mg/dL    GFR calc non Af Amer 14 (*) >90 mL/min    GFR calc Af Amer 16 (*) >90 mL/min   CBC     Status: Abnormal   Collection Time   10/30/11 12:00 PM      Component Value Range Comment   WBC 9.0  4.0 - 10.5 K/uL    RBC 3.05 (*) 4.22 - 5.81 MIL/uL    Hemoglobin 9.3 (*) 13.0 - 17.0 g/dL    HCT 62.9 (*) 52.8 - 52.0 %    MCV 102.0 (*) 78.0 - 100.0 fL    MCH 30.5  26.0 - 34.0 pg    MCHC 29.9 (*) 30.0 - 36.0 g/dL    RDW 41.3 (*) 24.4 - 15.5 %    Platelets 193  150 - 400 K/uL   CULTURE, ROUTINE-ABSCESS     Status: Normal (Preliminary result)   Collection Time   10/30/11  6:27 PM      Component Value Range Comment   Specimen Description ABSCESS SCROTUM      Special Requests NONE      Gram Stain        Value: NO WBC SEEN     FEW SQUAMOUS EPITHELIAL CELLS PRESENT     FEW GRAM POSITIVE COCCI IN PAIRS   Culture NO GROWTH      Report Status PENDING     CBC WITH DIFFERENTIAL     Status: Abnormal   Collection Time   10/31/11  6:28 AM      Component Value Range Comment   WBC 8.2  4.0 - 10.5 K/uL    RBC 2.69 (*) 4.22 - 5.81 MIL/uL    Hemoglobin 8.3 (*) 13.0 - 17.0 g/dL    HCT 01.0 (*) 27.2 - 52.0 %    MCV 98.9  78.0 - 100.0 fL    MCH 30.9  26.0 - 34.0 pg    MCHC 31.2  30.0 - 36.0 g/dL    RDW 53.6 (*) 64.4 - 15.5 %    Platelets 223  150 - 400 K/uL    Neutrophils Relative 78 (*) 43 - 77 %    Neutro Abs 6.4  1.7 - 7.7 K/uL    Lymphocytes Relative 14  12 - 46 %    Lymphs Abs 1.2  0.7 - 4.0 K/uL    Monocytes Relative 7  3 - 12 %    Monocytes Absolute 0.6  0.1 - 1.0 K/uL    Eosinophils Relative 1  0 - 5 %    Eosinophils Absolute 0.0  0.0 - 0.7 K/uL    Basophils Relative 1  0 - 1 %    Basophils Absolute 0.1  0.0 - 0.1 K/uL   BASIC METABOLIC PANEL     Status: Abnormal   Collection Time   10/31/11  6:28 AM      Component Value Range Comment   Sodium 141  135 - 145 mEq/L    Potassium 3.5  3.5 - 5.1 mEq/L    Chloride 102  96 - 112 mEq/L    CO2 28   19 - 32 mEq/L    Glucose, Bld 113 (*) 70 - 99 mg/dL    BUN  35 (*) 6 - 23 mg/dL    Creatinine, Ser 0.86 (*) 0.50 - 1.35 mg/dL    Calcium 9.0  8.4 - 57.8 mg/dL    GFR calc non Af Amer 11 (*) >90 mL/min    GFR calc Af Amer 13 (*) >90 mL/min     Ct Abdomen Pelvis W Contrast  10/31/2011  *RADIOLOGY REPORT*  Clinical Data: Abdominal pain.  Nausea and vomiting.  Evaluate masses.  CT ABDOMEN AND PELVIS WITH CONTRAST  Technique:  Multidetector CT imaging of the abdomen and pelvis was performed following the standard protocol during bolus administration of intravenous contrast.  Contrast: 80mL OMNIPAQUE IOHEXOL 300 MG/ML  SOLN  Comparison: 08/27/2011  Findings: Small bilateral pleural effusions with basilar atelectasis, demonstrating mild improvement since the previous study.  The liver parenchyma is homogeneous.  The spleen appears enlarged.  The gallbladder is poorly defined and is likely contracted.  There is infiltration throughout the subcutaneous and intra-abdominal fat consistent with edema.  No discrete fluid is demonstrated.  The pancreas and adrenal glands are unremarkable. There is extensive atherosclerotic calcification throughout the aortoiliac vessels and branch vessels.  Multicystic kidneys with multiple calcified lesions, likely cysts bilaterally.  No hydronephrosis.  Minimal if any visualized nephrograms.  Diffuse calcification of the renal arteries.  No significant change since previous study.  Dense calcifications in the celiac axis, superior mesenteric artery, and proximal iliac arteries likely to represent a stenosis.  Flow is demonstrated in these vessels.  Esophagus is distended with contrast material.  This could be due to reflux or dysmotility.  The stomach is distended by ingested material.  No wall thickening.  The small and large bowel do not appear significantly distended.  No free air in the abdomen.  Surgical changes in the left anterior abdominal wall.  Pelvis:  Free fluid in the  pelvis with density measurements suggesting ascites.  Prostatic enlargement.  Mild thickening of bladder wall.  In the left groin region, there are surgical clips.  There is a loculated gas and fluid collection in the left groin measuring 4.3 x 4.5 cm with a thick enhancing wall and adjacent enlarged enhancing lymph nodes.  This is consistent with abscess.  The abscess extends down anteriorly and to the abductor muscle region. This is new since the previous study.  Degenerative changes in the lumbar spine.  Lucencies in the femoral head and acetabular regions bilaterally likely representing degenerative cysts and stable since previous study.  IMPRESSION: 1. Since the previous study, there is interval development of an abscess with adjacent inflammatory lymph nodes in the left groin region.  2. Decreasing bilateral pleural effusions and basilar atelectasis. 3.  Multiple additional findings are stable since previous study.  Results were discussed by telephone at the time of dictation, 0150 hours on 10/31/2011, with the patient's nurse, Lanora Manis, on [facility] 5500.  Original Report Authenticated By: Marlon Pel, M.D.    ROS: Patient states that he feels great. He denies shortness of breath, chest pain, nausea, vomiting. He is having diarrhea. He denies pain although his wounds in his groin scrotum and has a poorly healing wound on his left foot which now has a VAC in place. He denies edema. He denies fevers, chills, night sweats. The remainder of the review systems is negative.  Blood pressure 155/72, pulse 79, temperature 98.4 F (36.9 C), temperature source Oral, resp. rate 16, height 5\' 6"  (1.676 m), weight 56 kg (123 lb 7.3 oz), SpO2 94.00%. In general: A chronically ill appearing  black male who is in an air bed. He is alert, oriented and in no acute distress. He is able to tell me his dialysis unit. HEENT: Pupils are equal round reactive to light symmetrical motions are intact, and the hemorrhoids are  moist. Neck: There is no jugular venous distention. There is a left sided PermCath in place. Lungs: Anteriorly are clear to auscultation bilaterally without wheezes. Cardiovascular: Regular rate and rhythm without murmur, gallop, or rub. Abdomen: Thin, positive bowel sounds, soft and nontender Extremities:  left transmetatarsal amputation with a wound VAC in place., Does not appear to be reddened. He has many old hemodialysis accesses in place which are functional.  By report he has a draining scrotal abscess which is bandaged at this time and was not examined  Assessment/Plan: 61 year old black male with end-stage renal disease and multiple vascular complications. He now presents with poor wound healing and is status post VAC placement a left transmetatarsal wound. 1 multiple wounds: Status post back to his left transmetatarsal wound. Scrotal/groin  abscess being managed with IV Zosyn and urology consult is pending. Patient is also on Flagyl. His white count is fairly normal and he just has low-grade temps.  It appears that he is due for debridement of his groin tomorrow so will need to coordinate HD with the OR.  2 ESRD: Patient normally dialyzes Monday Wednesday Friday at the [facility] unit. I was not informed of his presence in the hospital until this evening. His labs do not indicate an urgent need for dialysis we will provide its tomorrow which is Saturday. Next treatment will be on Monday on schedule. His access is a PermCath. 3 Hypertension: There is one high blood pressure but for the most part it seems controlled. He is really on no blood pressure medications.  4. Anemia of ESRD: Hemoglobin is noted to be 8.3. I will check iron stores and add Aranesp 5. Metabolic Bone Disease: He is not list of being on any binders. We'll check calcium phosphorus and PTH with treatment tomorrow.   Thank you for consultation we will continue to follow with you  Bryleigh Ottaway A 10/31/2011, 5:03 PM

## 2011-10-31 NOTE — Progress Notes (Signed)
[facility] [place]  Subjective  - POD #1  Status post revision of left transmetatarsal amputation. The patient has no complaints today.   Physical Exam:  Wound VAC is in place. Left groin seroma is draining through a pinpoint incision there is a raised area in the groin no surrounding cellulitis.       Assessment/Plan:  POD #1  The patient had a CT scan performed which showed a fluid collection around his proximal anastomosis. There is air within the fluid collection suggesting infection. I initially planned on the breathing this at the bedside however due to the extensive nature of the collection I feel this would be best performed in the operating room. I discussed this with the patient. This will be scheduled to be performed tomorrow morning by partner Dr. Edilia Bo.  The patient's bypass graft was done with vein. It was a femoral to below knee popliteal artery bypass graft. The vein bypass was used as a patch on the common femoral artery.  Kenda Kloehn IV, V. WELLS 10/31/2011 4:51 PM --  Filed Vitals:   10/31/11 1350  BP: 155/72  Pulse: 79  Temp: 98.4 F (36.9 C)  Resp: 16    Intake/Output Summary (Last 24 hours) at 10/31/11 1651 Last data filed at 10/31/11 1634  Gross per 24 hour  Intake    670 ml  Output      0 ml  Net    670 ml     Laboratory CBC    Component Value Date/Time   WBC 8.2 10/31/2011 0628   HGB 8.3* 10/31/2011 0628   HCT 26.6* 10/31/2011 0628   PLT 223 10/31/2011 0628    BMET    Component Value Date/Time   NA 141 10/31/2011 0628   K 3.5 10/31/2011 0628   CL 102 10/31/2011 0628   CO2 28 10/31/2011 0628   GLUCOSE 113* 10/31/2011 0628   BUN 35* 10/31/2011 0628   CREATININE 5.05* 10/31/2011 0628   CALCIUM 9.0 10/31/2011 0628   GFRNONAA 11* 10/31/2011 0628   GFRAA 13* 10/31/2011 0628    COAG Lab Results  Component Value Date   INR 1.30 09/12/2011   INR 1.38 08/27/2011   INR 1.54* 08/19/2011   No results found for this basename: PTT     Antibiotics Anti-infectives     Start     Dose/Rate Route Frequency Ordered Stop   11/28/11 1000   vancomycin (VANCOCIN) 50 mg/mL oral solution 125 mg        125 mg Oral Every 3 DAYS 10/30/11 0127 12/13/11 0959   11/20/11 1000   vancomycin (VANCOCIN) 50 mg/mL oral solution 125 mg        125 mg Oral Every other day 10/30/11 0127 11/28/11 0959   11/13/11 1000   vancomycin (VANCOCIN) 50 mg/mL oral solution 125 mg        125 mg Oral Daily 10/30/11 0127 11/20/11 0959   11/05/11 2200   vancomycin (VANCOCIN) 50 mg/mL oral solution 125 mg        125 mg Oral 2 times daily 10/30/11 0127 11/12/11 2159   10/31/11 1200   vancomycin (VANCOCIN) 750 mg in sodium chloride 0.9 % 150 mL IVPB        750 mg 150 mL/hr over 60 Minutes Intravenous Every M-W-F (Hemodialysis) 10/30/11 0407     10/30/11 0600  piperacillin-tazobactam (ZOSYN) IVPB 2.25 g       2.25 g 100 mL/hr over 30 Minutes Intravenous Every 8 hours 10/30/11  0407     10/30/11 0415   vancomycin (VANCOCIN) 500 mg in sodium chloride 0.9 % 100 mL IVPB        500 mg 100 mL/hr over 60 Minutes Intravenous  Once 10/30/11 0407 10/30/11 0535   10/30/11 0130   metroNIDAZOLE (FLAGYL) tablet 500 mg        500 mg Oral 3 times per day 10/30/11 0127 11/12/11 2159   10/30/11 0130   vancomycin (VANCOCIN) 50 mg/mL oral solution 125 mg        125 mg Oral 4 times daily 10/30/11 0127 11/05/11 2159   10/30/11 0115   vancomycin (VANCOCIN) IVPB 1000 mg/200 mL premix        1,000 mg 200 mL/hr over 60 Minutes Intravenous  Once 10/30/11 0101 10/30/11 0343   10/30/11 0100   Ampicillin-Sulbactam (UNASYN) 3 g in sodium chloride 0.9 % 100 mL IVPB        3 g 100 mL/hr over 60 Minutes Intravenous  Once 10/30/11 0047 10/30/11 0214   10/30/11 0100   ciprofloxacin (CIPRO) IVPB 400 mg        400 mg 200 mL/hr over 60 Minutes Intravenous  Once 10/30/11 0057 10/30/11 0103           V. Charlena Cross, M.D. [facility] [place] Office:  [phone] Pager:  [phone]

## 2011-10-31 NOTE — Consult Note (Signed)
Wound care follow-up:  Wound care follow-up:  Pt had I&D performed by Dr Vernie Ammons of urology service, refer to progress notes for assessment and plan of care. He has recommended dressing change routine and prefers to leave packing intact until Saturday. WOC team requested to assess wound and provide further input. Our team is not available on the upcoming weekend. Called Dr Vernie Ammons to discuss plan of care; Packing will remain in place until Sat, when bedside nurse can begin dressing changes as ordered. WOC will assess wound on Monday as requested.  Cammie Mcgee, RN, MSN, [employer] [phone]

## 2011-10-31 NOTE — Care Management Note (Unsigned)
Page 1 of 2   11/05/2011     2:35:22 PM   CARE MANAGEMENT NOTE 11/05/2011  Patient:  [name]   Account Number:  [id number]  Date Initiated:  10/31/2011  Documentation initiated by:  Letha Cape  Subjective/Objective Assessment:   dx scrotal abscess, non healing left foot,  admit-patient lives with family members, pt has hospital bed with air mattress over lay and rolling walker.  Patient is active with [facility].     Action/Plan:   8/9 for surgical i an d and vac placement.   Anticipated DC Date:  11/06/2011   Anticipated DC Plan:  LONG TERM ACUTE CARE (LTAC)      DC Planning Services  CM consult      Choice offered to / List presented to:  C-1 Patient           Status of service:  In process, will continue to follow Medicare Important Message given?   (If response is "NO", the following Medicare IM given date fields will be blank) Date Medicare IM given:   Date Additional Medicare IM given:    Discharge Disposition:    Per UR Regulation:  Reviewed for med. necessity/level of care/duration of stay  If discussed at Long Length of Stay Meetings, dates discussed:    Comments:  11/05/11 14:18 Letha Cape RN, BSN [phone] patient was in HD today and became hypotensive, hypoxic and hgb low,  patient was brought back to floor and taken to ICU.  Patient did have a bed in [facility] on 8/13 but patient was having dark brown emesis and diarrrhea.  As of 8/14 [facility] does not have a HD bed any longer.  I made a referral to [facility] and also informed [facility] rep that patient is transferred to ICU.  Patient was agreeable to go to [facility] but need to talk to him about [facility].  CSW will need to speak with patient as well and start SNF search. [facility] does not know how long it will take for them to get another available HD bed.  Patient only had 11 days medicare for LTAC , but should have  a lot more of snf days available.   [facility] was willing to share the 11 days with [facility] in  which pt could stay there longer.  [facility] and SNF options need to be discussed when appropriate.  Per Kathlene November with [facility] they can take patient, informed NCM on 2900.  8/14 12:31p debbie dowell rn,bsn pt transf to ccu after rapid response for hypotenion.  11/04/11 12:17 Letha Cape RN, BSN [phone] patient for LTAC. per Boneta Lucks patient is excellent candidate. Patient has 11 medicare days but [facility] will help share this with [facility],  informed her that patient is not ready yet and MD will let us know when patient is ready.   I spoke with patient and Jimmie who is patient's sister yesterday and they are in agreement with LTAC.  Today patient has been vomiting and has diarrhea,  per MD if patient is able to eat and keep food down today and if vascular can follow him in [facility] than he may be able to go later today.  I called Boneta Lucks with Ltac to inform her of this she states they are full now and she will need to check bed status and get back with me. I spoke with Rene Kocher in vascular and she states they will be able to follow pt in LTAC.  11/03/11 10:24 Letha Cape RN, BSN  [phone] Spoke with Boneta Lucks with LTAC she will do an assessment on Patient today for LTAC.  NCM will continue to follow for dc needs.  10/31/11 15:33 Letha Cape RN, BSN [phone] patient lives with relatives his son and other family member who helps him a lot at home,  patient has a hospital bed with air overlay matteress and rolling walker, patient is active with [facility] for [facility], pt, ot and aide.  Notifed Lupita Leash that patient is here.  Also mentioned to patient about LTAC could be a possibility for him but we will know more information by next week depending on his progression. Patient states if he goes home with [facility] he would like to continue with [facility].  Patient has medication coverage and transportation.  NCM will continue to follow for dc needs.

## 2011-10-31 NOTE — Progress Notes (Signed)
Patient ID: [name], male   DOB: [birth year], 61 y.o.   MRN: [id number]  Subjective: Patient reports minimal scrotal pain.  Objective: Vital signs in last 24 hours: Temp:  [97 F (36.1 C)-99.6 F (37.6 C)] 99.6 F (37.6 C) (08/09 0603) Pulse Rate:  [64-143] 94  (08/09 0603) Resp:  [9-20] 16  (08/09 0603) BP: (98-182)/(53-82) 98/53 mmHg (08/09 0603) SpO2:  [86 %-100 %] 98 % (08/09 0603) Weight:  [56 kg (123 lb 7.3 oz)] 56 kg (123 lb 7.3 oz) (08/09 0500)A  Intake/Output from previous day: 08/08 0701 - 08/09 0700 In: 800 [P.O.:400; I.V.:400] Out: -  Intake/Output this shift:    Past Medical History  Diagnosis Date  . Hyperlipidemia   . ESRD (end stage renal disease)     a. MWF dialysis in [place] (followed by Dr. Kristian Covey)  . Arthritis   . Claudication   . Stroke 2005  . Anemia   . Chronic diastolic CHF (congestive heart failure) 07/2010    a. 07/2008 Echo EF 50-55%, mild-mod LVH, Gr 1 DD.  Marland Kitchen Peripheral vascular occlusive disease     a. 07/2011 Periph Angio: No signif Ao-illiac dzs, LCF stenosis, 100% LSFA w recon above knee pop and 3 vessel runoff, 100% RSFA w/ recon above knee --> pending L Fem to below Knee pop bypass.  Marland Kitchen History of tobacco abuse   . Hypertension     takes Amlodipine,Metoprolol,and Prinivil daily  . Dyspnea on exertion     with exertion  . GERD (gastroesophageal reflux disease)   . Cataract     bilateral  . Dialysis patient     Physical Exam:  The scrotum is soft, non-fluctuant and free of crepitus. The wound is draining serosanguineous fluid. There appears to be some slight interval improvement in the amount of induration as well. He has developed a raised, fluctuant abscess in the area of the left groin.  Lab Results:  Basename 10/31/11 0628 10/30/11 1200 10/29/11 2027  WBC 8.2 9.0 10.0  HGB 8.3* 9.3* 10.4*  HCT 26.6* 31.1* 34.0*   BMET  Basename 10/30/11 1200 10/29/11 2027  NA 142 139  K 3.3* 3.4*  CL 103 102  CO2 26 25  GLUCOSE  60* 92  BUN 26* 21  CREATININE 4.20* 3.65*  CALCIUM 9.0 9.1   No results found for this basename: LABURIN:1 in the last 72 hours Results for orders placed during the hospital encounter of 10/29/11  SURGICAL PCR SCREEN     Status: Normal   Collection Time   10/30/11  6:48 AM      Component Value Range Status Comment   MRSA, PCR NEGATIVE  NEGATIVE Final    Staphylococcus aureus NEGATIVE  NEGATIVE Final   CULTURE, ROUTINE-ABSCESS     Status: Normal (Preliminary result)   Collection Time   10/30/11  6:27 PM      Component Value Range Status Comment   Specimen Description ABSCESS SCROTUM   Final    Special Requests NONE   Final    Gram Stain     Final    Value: NO WBC SEEN     FEW SQUAMOUS EPITHELIAL CELLS PRESENT     FEW GRAM POSITIVE COCCI IN PAIRS   Culture PENDING   Incomplete    Report Status PENDING   Incomplete     Studies/Results: @RISRSLT24 @  Assessment/Plan: His scrotal abscess has been drained and he appears to be doing well. He is on appropriate antibiotics and his Gram stain shows  gram-positive cocci in pairs. ID and sensitivity remain pending.  He will undergo wound care as per orders. Continue current antibiotics until culture results return allowing directed antibiotic therapy. It would appear he is going to need drainage of his a inguinal abscess by vascular surgery.  Aeric Burnham C 10/31/2011, 6:50 AM

## 2011-10-31 NOTE — Progress Notes (Signed)
[facility] PROGRESS NOTE  [name] WUJ:[id number] DOB: 27-Jan-1951 DOA: 10/29/2011 PCP: Toma Deiters, MD  Assessment/Plan: Principal Problem:  *Scrotal abscess Active Problems:  CKD (chronic kidney disease) stage V requiring chronic dialysis  Hypertension  Peripheral vascular disease, unspecified  C. difficile colitis- recalcitrant  Non-healing surgical wound  Diarrhea  1. Scrotal Abscess: S/P I&D. Continue current antibiotics. 2. Groin Abscess: Abscess noted in region of groin. This appears to be in the region of the entry point related to the fem-pop bypass.   Sopke with Dr. Myra Gianotti and he will see pt for possible I&D.            3. ESRD: HD M/W/F continue per nephrology 4. PVD/ nonhealing surgical wound: Pt S/P debridement and placemnt of wound vac.. 5. Diarrhea: The patient presented with some complaints of diarrhea. He's had a history of recalcitrant C. difficile colitis and was empirically started on Flagyl and vancomycin. We will send stool for C. difficile and adjust therapies accordingly  Code Status: Full Code Family Communication: N/A Disposition Plan: Not yet determined  [name] A.  [facility] Pager [phone]. If 8PM-8AM, please contact night-coverage at [email or web address], password [facility] 10/31/2011, 7:49 PM  LOS: 2 days   Brief narrative: [name] is a 61 year old male with end stage renal disease and peripheral vascular disease presented to the emergency room at [facility] with a four-day history of left hemiscrotal pain. He was seen and evaluated there. He states that he has had a previous scrotal abscess that he described as a "boil" that he popped himself in the past and which healed completly. He complains of discomfort with any movement of the scrotum, however he has not had any fevers.   Consultants:  Dr. Myra Gianotti- VVS  Urology- [place]  Procedures:  S/P shunt transmetatarsal amputation and placement of wound  VAC  Antibiotics:  Flagyl 8/8 >>  Oral Vancomycin 8/8 >>  IV vancomycin 8/8 >>  HPI/Subjective: Patient with no complaints.  Objective: Filed Vitals:   10/31/11 0500 10/31/11 0603 10/31/11 1000 10/31/11 1350  BP:  98/53 112/50 155/72  Pulse:  94  79  Temp:  99.6 F (37.6 C)  98.4 F (36.9 C)  TempSrc:  Oral  Oral  Resp:  16  16  Height:      Weight: 56 kg (123 lb 7.3 oz)     SpO2:  98%  94%   Weight change: -10.225 kg (-22 lb 8.7 oz)  Intake/Output Summary (Last 24 hours) at 10/31/11 1949 Last data filed at 10/31/11 1900  Gross per 24 hour  Intake    450 ml  Output      0 ml  Net    450 ml    General: Alert, awake, oriented x3, in no acute distress.  Neck: Trachea midline,  no masses, no thyromegal,y no JVD, no carotid bruit OROPHARYNX:  Moist, No exudate/ erythema/lesions.  Heart: Regular rate and rhythm, without murmurs, rubs, gallops, PMI non-displaced, no heaves or thrills on palpation.  Lungs: Clear to auscultation, no wheezing or rhonchi noted.   Abdomen: Soft. Patient has an area in the left lower quadrant that appears to be a draining abscess. Upon palpation the patient had spotting of fluid out of the boil/draining abscess Genitalia: Patient has a left draining scrotal abscess.  Data Reviewed: Basic Metabolic Panel:  Lab 10/31/11 8657 10/30/11 1200 10/29/11 2027  NA 141 142 139  K 3.5 3.3* 3.4*  CL 102 103 102  CO2 28 26 25  GLUCOSE 113* 60* 92  BUN 35* 26* 21  CREATININE 5.05* 4.20* 3.65*  CALCIUM 9.0 9.0 9.1  MG -- -- --  PHOS -- -- --   Liver Function Tests: No results found for this basename: AST:5,ALT:5,ALKPHOS:5,BILITOT:5,PROT:5,ALBUMIN:5 in the last 168 hours No results found for this basename: LIPASE:5,AMYLASE:5 in the last 168 hours No results found for this basename: AMMONIA:5 in the last 168 hours CBC:  Lab 10/31/11 0628 10/30/11 1200 10/29/11 2027  WBC 8.2 9.0 10.0  NEUTROABS 6.4 -- 7.7  HGB 8.3* 9.3* 10.4*  HCT 26.6* 31.1*  34.0*  MCV 98.9 102.0* 102.7*  PLT 223 193 184   Cardiac Enzymes: No results found for this basename: CKTOTAL:5,CKMB:5,CKMBINDEX:5,TROPONINI:5 in the last 168 hours BNP (last 3 results)  Basename 08/28/11 0540 08/27/11 0930 08/26/11 2305  PROBNP 21860.0* 19652.0* 25900.0*   CBG: No results found for this basename: GLUCAP:5 in the last 168 hours  Recent Results (from the past 240 hour(s))  STOOL CULTURE     Status: Normal (Preliminary result)   Collection Time   10/29/11  8:12 PM      Component Value Range Status Comment   Specimen Description STOOL   Final    Special Requests NONE   Final    Culture Culture reincubated for better growth   Final    Report Status PENDING   Incomplete   SURGICAL PCR SCREEN     Status: Normal   Collection Time   10/30/11  6:48 AM      Component Value Range Status Comment   MRSA, PCR NEGATIVE  NEGATIVE Final    Staphylococcus aureus NEGATIVE  NEGATIVE Final   CULTURE, ROUTINE-ABSCESS     Status: Normal (Preliminary result)   Collection Time   10/30/11  6:27 PM      Component Value Range Status Comment   Specimen Description ABSCESS SCROTUM   Final    Special Requests NONE   Final    Gram Stain     Final    Value: NO WBC SEEN     FEW SQUAMOUS EPITHELIAL CELLS PRESENT     FEW GRAM POSITIVE COCCI IN PAIRS   Culture NO GROWTH   Final    Report Status PENDING   Incomplete      Studies: Portable Chest 1 View  10/12/2011  *RADIOLOGY REPORT*  Clinical Data: SOB, evaluate for pneumonia  PORTABLE CHEST - 1 VIEW  Comparison: 10/11/2011  Findings: Cardiomegaly with mild interstitial edema and moderate right pleural effusion.  Suspected small left pleural effusion.  Associated right lower lobe opacity, likely atelectasis, underlying pneumonia not excluded.  No pneumothorax.  Stable left IJ venous catheter.  IMPRESSION: Cardiomegaly with mild interstitial edema and moderate right pleural effusion.  Suspected small left pleural effusion.  Associate right lower  lobe opacity, likely atelectasis, underlying pneumonia not excluded.  Original Report Authenticated By: Charline Bills, M.D.   Dg Abd Acute W/chest  10/11/2011  *RADIOLOGY REPORT*  Clinical Data: Abdominal pain, nausea and vomiting.  ACUTE ABDOMEN SERIES (ABDOMEN 2 VIEW & CHEST 1 VIEW)  Comparison: Abdominal radiographs 08/28/2011.  Chest x-ray 09/10/2011.  Findings: Left internal jugular PermCath with the tips terminating in the right atrium and at the superior cavoatrial junction is unchanged.  Lung volumes are low.  Linear opacities in the left mid and lower lung are most consistent with atelectasis.  Patchy airspace consolidation and volume loss in the right middle and lower lobes.  Trace left and small right-sided pleural effusions. No pneumoperitoneum.  Supine and upright views of the abdomen demonstrate gas and stool scattered throughout the colon extending to the level of the distal rectum.  No pathologic distension of small bowel is noted.  Midline surgical staples are again noted.  Numerous vascular calcifications.  In addition, there are numerous egg-shell type calcifications in the left upper quadrant of the abdomen which appear to correspond with numerous calcified cystic lesions based on comparison with prior CT scans.  IMPRESSION: 1.  Nonobstructive bowel gas pattern. 2.  No pneumoperitoneum. 3.  Support apparatus and postoperative changes, as above. 4.  Air space consolidation and atelectasis in the right middle and lower lobes, concerning for pneumonia and/or sequelae of aspiration. 5.  Small right and trace left-sided pleural effusions. 6.  Atherosclerosis.  Original Report Authenticated By: Florencia Reasons, M.D.   Dg Foot Complete Left  10/17/2011  *RADIOLOGY REPORT*  Clinical Data: Wound infection  LEFT FOOT - COMPLETE 3+ VIEW  Comparison: None.  Findings: The patient is status post amputation of the foot beyond the proximal aspect of the five metatarsals.  No acute fracture. No  dislocation.  No periosteal reaction.  No destructive bone lesion.  Vascular calcifications are prominent.  An open wound at the amputation site is noted.  IMPRESSION: Status post forefoot amputation.  Open wound is present at the amputation site.  No acute bony pathology.  Original Report Authenticated By: Donavan Burnet, M.D.    Scheduled Meds:    . darbepoetin (ARANESP) injection - DIALYSIS  100 mcg Intravenous Q Mon-HD  . feeding supplement (NEPRO CARB STEADY)  237 mL Oral BID BM  . gabapentin  200 mg Oral Daily  . HYDROmorphone      . iohexol  20 mL Oral Q1 Hr x 2  . isosorbide mononitrate  30 mg Oral Daily  . lidocaine-EPINEPHrine  10 mL Intradermal Once  . metroNIDAZOLE  500 mg Oral Q8H  . piperacillin-tazobactam (ZOSYN)  IV  2.25 g Intravenous Q8H  . sodium chloride  3 mL Intravenous Q12H  . vancomycin  125 mg Oral QID   Followed by  . vancomycin  125 mg Oral BID   Followed by  . vancomycin  125 mg Oral Daily   Followed by  . vancomycin  125 mg Oral QODAY   Followed by  . vancomycin  125 mg Oral Q3 days  . vancomycin  750 mg Intravenous Q M,W,F-HD   Continuous Infusions:   Principal Problem:  *Scrotal abscess Active Problems:  CKD (chronic kidney disease) stage V requiring chronic dialysis  Hypertension  Peripheral vascular disease, unspecified  C. difficile colitis- recalcitrant  Non-healing surgical wound  Diarrhea

## 2011-11-01 ENCOUNTER — Encounter (HOSPITAL_COMMUNITY)
Admission: EM | Disposition: A | Discharge status: Nursing Home (Includes ICF) | Payer: Self-pay | Source: Home / Self Care | Attending: Internal Medicine

## 2011-11-01 ENCOUNTER — Encounter (HOSPITAL_COMMUNITY): Payer: Self-pay | Admitting: Anesthesiology

## 2011-11-01 ENCOUNTER — Inpatient Hospital Stay (HOSPITAL_COMMUNITY): Payer: Medicare Other

## 2011-11-01 ENCOUNTER — Inpatient Hospital Stay (HOSPITAL_COMMUNITY): Payer: Medicare Other | Admitting: Anesthesiology

## 2011-11-01 DIAGNOSIS — I898 Other specified noninfective disorders of lymphatic vessels and lymph nodes: Secondary | ICD-10-CM | POA: Diagnosis present

## 2011-11-01 DIAGNOSIS — T827XXA Infection and inflammatory reaction due to other cardiac and vascular devices, implants and grafts, initial encounter: Secondary | ICD-10-CM

## 2011-11-01 DIAGNOSIS — E162 Hypoglycemia, unspecified: Secondary | ICD-10-CM

## 2011-11-01 HISTORY — PX: GROIN DEBRIDEMENT: SHX5159

## 2011-11-01 LAB — GLUCOSE, CAPILLARY
Glucose-Capillary: 40 mg/dL — CL (ref 70–99)
Glucose-Capillary: 102 mg/dL — ABNORMAL HIGH (ref 70–99)
Glucose-Capillary: 103 mg/dL — ABNORMAL HIGH (ref 70–99)

## 2011-11-01 LAB — IRON AND TIBC
Saturation Ratios: 16 % — ABNORMAL LOW (ref 20–55)
UIBC: 72 ug/dL — ABNORMAL LOW (ref 125–400)

## 2011-11-01 LAB — RENAL FUNCTION PANEL
BUN: 43 mg/dL — ABNORMAL HIGH (ref 6–23)
CO2: 25 mEq/L (ref 19–32)
Calcium: 9.4 mg/dL (ref 8.4–10.5)
Creatinine, Ser: 6.33 mg/dL — ABNORMAL HIGH (ref 0.50–1.35)
GFR calc Af Amer: 10 mL/min — ABNORMAL LOW (ref >90)
Glucose, Bld: 107 mg/dL — ABNORMAL HIGH (ref 70–99)
Phosphorus: 7 mg/dL — ABNORMAL HIGH (ref 2.3–4.6)
Sodium: 138 mEq/L (ref 135–145)

## 2011-11-01 LAB — CBC
HCT: 25.5 % — ABNORMAL LOW (ref 39.0–52.0)
Hemoglobin: 7.9 g/dL — ABNORMAL LOW (ref 13.0–17.0)
MCH: 30.3 pg (ref 26.0–34.0)
MCHC: 31 g/dL (ref 30.0–36.0)
MCV: 97.7 fL (ref 78.0–100.0)
RBC: 2.61 MIL/uL — ABNORMAL LOW (ref 4.22–5.81)

## 2011-11-01 LAB — FERRITIN: Ferritin: 1001 ng/mL — ABNORMAL HIGH (ref 22–322)

## 2011-11-01 LAB — PROCALCITONIN: Procalcitonin: 1.43 ng/mL

## 2011-11-01 SURGERY — DEBRIDEMENT, INGUINAL REGION
Anesthesia: General | Site: Groin | Laterality: Left | Wound class: Dirty or Infected

## 2011-11-01 MED ORDER — MORPHINE SULFATE 4 MG/ML IJ SOLN
INTRAMUSCULAR | Status: AC
  Administered 2011-11-01: 4 mg via INTRAVENOUS
  Filled 2011-11-01: qty 1

## 2011-11-01 MED ORDER — VANCOMYCIN HCL 1000 MG IV SOLR
750.0000 mg | INTRAVENOUS | Status: DC
  Administered 2011-11-03: 750 mg via INTRAVENOUS
  Filled 2011-11-01 (×2): qty 750

## 2011-11-01 MED ORDER — NEOSTIGMINE METHYLSULFATE 1 MG/ML IJ SOLN
INTRAMUSCULAR | Status: DC | PRN
  Administered 2011-11-01: 3 mg via INTRAVENOUS

## 2011-11-01 MED ORDER — VANCOMYCIN HCL 1000 MG IV SOLR
750.0000 mg | Freq: Once | INTRAVENOUS | Status: DC
  Filled 2011-11-01: qty 750

## 2011-11-01 MED ORDER — DEXTROSE 50 % IV SOLN
INTRAVENOUS | Status: DC | PRN
  Administered 2011-11-01: .25 via INTRAVENOUS

## 2011-11-01 MED ORDER — METOCLOPRAMIDE HCL 5 MG/ML IJ SOLN
INTRAMUSCULAR | Status: DC | PRN
  Administered 2011-11-01: 10 mg via INTRAVENOUS

## 2011-11-01 MED ORDER — GLYCOPYRROLATE 0.2 MG/ML IJ SOLN
INTRAMUSCULAR | Status: DC | PRN
  Administered 2011-11-01: 0.4 mg via INTRAVENOUS

## 2011-11-01 MED ORDER — DEXTROSE 50 % IV SOLN
25.0000 mL | Freq: Once | INTRAVENOUS | Status: AC
  Administered 2011-11-01: 25 mL via INTRAVENOUS

## 2011-11-01 MED ORDER — LIDOCAINE HCL (CARDIAC) 20 MG/ML IV SOLN
INTRAVENOUS | Status: DC | PRN
  Administered 2011-11-01: 40 mg via INTRAVENOUS

## 2011-11-01 MED ORDER — ALTEPLASE 100 MG IV SOLR
4.1000 mg | Freq: Once | INTRAVENOUS | Status: DC
  Filled 2011-11-01: qty 4

## 2011-11-01 MED ORDER — MIDAZOLAM HCL 5 MG/5ML IJ SOLN
INTRAMUSCULAR | Status: DC | PRN
  Administered 2011-11-01: 2 mg via INTRAVENOUS

## 2011-11-01 MED ORDER — DEXAMETHASONE SODIUM PHOSPHATE 4 MG/ML IJ SOLN
INTRAMUSCULAR | Status: DC | PRN
  Administered 2011-11-01: 4 mg via INTRAVENOUS

## 2011-11-01 MED ORDER — PROPOFOL 10 MG/ML IV EMUL
INTRAVENOUS | Status: DC | PRN
  Administered 2011-11-01: 150 mg via INTRAVENOUS

## 2011-11-01 MED ORDER — HEPARIN SODIUM (PORCINE) 5000 UNIT/ML IJ SOLN
5000.0000 [IU] | Freq: Three times a day (TID) | INTRAMUSCULAR | Status: DC
  Administered 2011-11-01 – 2011-11-05 (×8): 5000 [IU] via SUBCUTANEOUS
  Filled 2011-11-01 (×15): qty 1

## 2011-11-01 MED ORDER — FENTANYL CITRATE 0.05 MG/ML IJ SOLN
INTRAMUSCULAR | Status: DC | PRN
  Administered 2011-11-01: 100 ug via INTRAVENOUS

## 2011-11-01 MED ORDER — 0.9 % SODIUM CHLORIDE (POUR BTL) OPTIME
TOPICAL | Status: DC | PRN
  Administered 2011-11-01: 1000 mL

## 2011-11-01 MED ORDER — SODIUM CHLORIDE 0.9 % IV SOLN
INTRAVENOUS | Status: DC | PRN
  Administered 2011-11-01: 11:00:00 via INTRAVENOUS

## 2011-11-01 MED ORDER — PHENYLEPHRINE HCL 10 MG/ML IJ SOLN
INTRAMUSCULAR | Status: DC | PRN
  Administered 2011-11-01: 80 ug via INTRAVENOUS

## 2011-11-01 MED ORDER — ROCURONIUM BROMIDE 100 MG/10ML IV SOLN
INTRAVENOUS | Status: DC | PRN
  Administered 2011-11-01: 40 mg via INTRAVENOUS

## 2011-11-01 SURGICAL SUPPLY — 35 items
BANDAGE GAUZE ELAST BULKY 4 IN (GAUZE/BANDAGES/DRESSINGS) IMPLANT
CANISTER SUCTION 2500CC (MISCELLANEOUS) ×2 IMPLANT
CLOTH BEACON ORANGE TIMEOUT ST (SAFETY) ×2 IMPLANT
COVER SURGICAL LIGHT HANDLE (MISCELLANEOUS) ×2 IMPLANT
DRAPE EXTREMITY T 121X128X90 (DRAPE) ×2 IMPLANT
DRSG VAC ATS SM SENSATRAC (GAUZE/BANDAGES/DRESSINGS) ×2 IMPLANT
ELECT REM PT RETURN 9FT ADLT (ELECTROSURGICAL) ×2
ELECTRODE REM PT RTRN 9FT ADLT (ELECTROSURGICAL) ×1 IMPLANT
GLOVE BIO SURGEON STRL SZ7.5 (GLOVE) ×2 IMPLANT
GLOVE BIO SURGEON STRL SZ8 (GLOVE) ×2 IMPLANT
GLOVE BIOGEL PI IND STRL 7.5 (GLOVE) ×1 IMPLANT
GLOVE BIOGEL PI IND STRL 8 (GLOVE) ×1 IMPLANT
GLOVE BIOGEL PI INDICATOR 7.5 (GLOVE) ×1
GLOVE BIOGEL PI INDICATOR 8 (GLOVE) ×1
GOWN STRL NON-REIN LRG LVL3 (GOWN DISPOSABLE) ×8 IMPLANT
GOWN STRL REIN 3XL LVL4 (GOWN DISPOSABLE) ×2 IMPLANT
KIT BASIN OR (CUSTOM PROCEDURE TRAY) ×2 IMPLANT
KIT ROOM TURNOVER OR (KITS) ×2 IMPLANT
NS IRRIG 1000ML POUR BTL (IV SOLUTION) ×2 IMPLANT
PACK GENERAL/GYN (CUSTOM PROCEDURE TRAY) ×2 IMPLANT
PACK UNIVERSAL I (CUSTOM PROCEDURE TRAY) IMPLANT
PAD ARMBOARD 7.5X6 YLW CONV (MISCELLANEOUS) ×4 IMPLANT
PAD NEG PRESSURE SENSATRAC (MISCELLANEOUS) ×2 IMPLANT
SPONGE GAUZE 4X4 12PLY (GAUZE/BANDAGES/DRESSINGS) IMPLANT
STAPLER VISISTAT 35W (STAPLE) IMPLANT
SUT ETHILON 3 0 PS 1 (SUTURE) IMPLANT
SUT VIC AB 2-0 CTB1 (SUTURE) IMPLANT
SUT VIC AB 3-0 SH 27 (SUTURE) ×3
SUT VIC AB 3-0 SH 27X BRD (SUTURE) ×3 IMPLANT
SUT VICRYL 4-0 PS2 18IN ABS (SUTURE) IMPLANT
SWAB COLLECTION DEVICE MRSA (MISCELLANEOUS) ×2 IMPLANT
TOWEL OR 17X24 6PK STRL BLUE (TOWEL DISPOSABLE) ×2 IMPLANT
TOWEL OR 17X26 10 PK STRL BLUE (TOWEL DISPOSABLE) ×2 IMPLANT
TUBE ANAEROBIC SPECIMEN COL (MISCELLANEOUS) ×2 IMPLANT
WATER STERILE IRR 1000ML POUR (IV SOLUTION) IMPLANT

## 2011-11-01 NOTE — Progress Notes (Signed)
[facility] PROGRESS NOTE  [name] ZOX:[id number] DOB: 08/18/1950 DOA: 10/29/2011 PCP: Earl Deiters, MD  Assessment/Plan: Principal Problem:  *Scrotal abscess Active Problems:  CKD (chronic kidney disease) stage V requiring chronic dialysis  Hypertension  Peripheral vascular disease, unspecified  C. difficile colitis- recalcitrant  Non-healing surgical wound  Diarrhea  1. Hypoglycemia: Patient had several episodes of hypoglycemia both pre-and postsurgical today. I suspect this is secondary to his n.p.o. status after midnight. I will continue to monitor his blood sugars on a every 2 hour basis. If blood sugars are greater than 90 on 3 occasions consecutively then we will discontinue blood sugar monitoring. 2. Scrotal Abscess: S/P I&D. Cultures show moderate enterococcus. Sensitivities are pending 3. Fluid collection in Groin: Patient underwent exploration of left groin and repair of lymphocele. He also had placement of wound VAC today. I will defer to [facility] vascular surgery for further management of lymphocele. 4.  ESRD: HD M/W/F continue per nephrology     5. PVD/ nonhealing surgical wound: Pt S/P debridement and placemnt of wound vac.. 6. Diarrhea: Patient has had no further diarrhea since admission. Will discontinue oral vancomycin and Flagyl and observe  in the interest of not creating further resistance. 7. Disposition: This patient will likely need to go to LTAC prior to his discharged home. Code Status: Full Code Family Communication: N/A Disposition Plan: Not yet determined  [name] A.  [facility] Pager [phone]. If 8PM-8AM, please contact night-coverage at [email or web address], password [facility] 11/01/2011, 6:22 PM  LOS: 3 days   Brief narrative: [name] is a 61 year old male with end stage renal disease and peripheral vascular disease presented to the emergency room at [facility] with a four-day history of left hemiscrotal pain. He was seen and  evaluated there. He states that he has had a previous scrotal abscess that he described as a "boil" that he popped himself in the past and which healed completly. He complains of discomfort with any movement of the scrotum, however he has not had any fevers.   Consultants:  [name]- VVS  Urology- [name]  Procedures:  S/P shunt transmetatarsal amputation and placement of wound VAC  Antibiotics:  Flagyl 8/8 >> 8/10  Oral Vancomycin 8/8 >> 8/10  IV vancomycin 8/8 >>  HPI/Subjective: Patient with no complaints.  Objective: Filed Vitals:   11/01/11 1600 11/01/11 1630 11/01/11 1642 11/01/11 1700  BP: 173/60 137/39 158/48 166/58  Pulse: 62 69 63 69  Temp:   97.3 F (36.3 C)   TempSrc:   Oral   Resp: 18 16 19 12   Height:      Weight:   54.5 kg (120 lb 2.4 oz)   SpO2:   100%    Weight change: 9.2 kg (20 lb 4.5 oz)  Intake/Output Summary (Last 24 hours) at 11/01/11 1822 Last data filed at 11/01/11 1642  Gross per 24 hour  Intake    160 ml  Output    136 ml  Net     24 ml    General: Alert, awake, oriented x3, in no acute distress.  Neck: Trachea midline,  no masses, no thyromegal,y no JVD, no carotid bruit OROPHARYNX:  Moist, No exudate/ erythema/lesions.  Heart: Regular rate and rhythm, without murmurs, rubs, gallops, PMI non-displaced, no heaves or thrills on palpation.  Lungs: Clear to auscultation, no wheezing or rhonchi noted.   Abdomen: Soft. Patient has an area in the left lower quadrant that appears to be a draining abscess. Wound VAC in place in  the left groin area. Genitalia: Patient has a left draining scrotal abscess.  Data Reviewed: Basic Metabolic Panel:  Lab 11/01/11 1610 10/31/11 0628 10/30/11 1200 10/29/11 2027  NA 138 141 142 139  K 4.3 3.5 3.3* 3.4*  CL 101 102 103 102  CO2 25 28 26 25   GLUCOSE 107* 113* 60* 92  BUN 43* 35* 26* 21  CREATININE 6.33* 5.05* 4.20* 3.65*  CALCIUM 9.4 9.0 9.0 9.1  MG -- -- -- --  PHOS 7.0* -- -- --   Liver  Function Tests:  Lab 11/01/11 1630  AST --  ALT --  ALKPHOS --  BILITOT --  PROT --  ALBUMIN 1.6*   No results found for this basename: LIPASE:5,AMYLASE:5 in the last 168 hours No results found for this basename: AMMONIA:5 in the last 168 hours CBC:  Lab 11/01/11 1630 10/31/11 0628 10/30/11 1200 10/29/11 2027  WBC 12.1* 8.2 9.0 10.0  NEUTROABS -- 6.4 -- 7.7  HGB 7.9* 8.3* 9.3* 10.4*  HCT 25.5* 26.6* 31.1* 34.0*  MCV 97.7 98.9 102.0* 102.7*  PLT 244 223 193 184   Cardiac Enzymes: No results found for this basename: CKTOTAL:5,CKMB:5,CKMBINDEX:5,TROPONINI:5 in the last 168 hours BNP (last 3 results)  Basename 08/28/11 0540 08/27/11 0930 08/26/11 2305  PROBNP 21860.0* 19652.0* 25900.0*   CBG:  Lab 11/01/11 1502 11/01/11 1438 11/01/11 1301 11/01/11 1222  GLUCAP 104* 50* 48* 40*    Recent Results (from the past 240 hour(s))  STOOL CULTURE     Status: Normal (Preliminary result)   Collection Time   10/29/11  8:12 PM      Component Value Range Status Comment   Specimen Description STOOL   Final    Special Requests NONE   Final    Culture Culture reincubated for better growth   Final    Report Status PENDING   Incomplete   SURGICAL PCR SCREEN     Status: Normal   Collection Time   10/30/11  6:48 AM      Component Value Range Status Comment   MRSA, PCR NEGATIVE  NEGATIVE Final    Staphylococcus aureus NEGATIVE  NEGATIVE Final   CULTURE, ROUTINE-ABSCESS     Status: Normal (Preliminary result)   Collection Time   10/30/11  6:27 PM      Component Value Range Status Comment   Specimen Description ABSCESS SCROTUM   Final    Special Requests NONE   Final    Gram Stain     Final    Value: NO WBC SEEN     FEW SQUAMOUS EPITHELIAL CELLS PRESENT     FEW GRAM POSITIVE COCCI IN PAIRS   Culture MODERATE ENTEROCOCCUS SPECIES   Final    Report Status PENDING   Incomplete      Studies: Portable Chest 1 View  10/12/2011  *RADIOLOGY REPORT*  Clinical Data: SOB, evaluate for pneumonia   PORTABLE CHEST - 1 VIEW  Comparison: 10/11/2011  Findings: Cardiomegaly with mild interstitial edema and moderate right pleural effusion.  Suspected small left pleural effusion.  Associated right lower lobe opacity, likely atelectasis, underlying pneumonia not excluded.  No pneumothorax.  Stable left IJ venous catheter.  IMPRESSION: Cardiomegaly with mild interstitial edema and moderate right pleural effusion.  Suspected small left pleural effusion.  Associate right lower lobe opacity, likely atelectasis, underlying pneumonia not excluded.  Original Report Authenticated By: Charline Bills, M.D.   Dg Abd Acute W/chest  10/11/2011  *RADIOLOGY REPORT*  Clinical Data: Abdominal pain, nausea and vomiting.  ACUTE ABDOMEN SERIES (ABDOMEN 2 VIEW & CHEST 1 VIEW)  Comparison: Abdominal radiographs 08/28/2011.  Chest x-ray 09/10/2011.  Findings: Left internal jugular PermCath with the tips terminating in the right atrium and at the superior cavoatrial junction is unchanged.  Lung volumes are low.  Linear opacities in the left mid and lower lung are most consistent with atelectasis.  Patchy airspace consolidation and volume loss in the right middle and lower lobes.  Trace left and small right-sided pleural effusions. No pneumoperitoneum.  Supine and upright views of the abdomen demonstrate gas and stool scattered throughout the colon extending to the level of the distal rectum.  No pathologic distension of small bowel is noted.  Midline surgical staples are again noted.  Numerous vascular calcifications.  In addition, there are numerous egg-shell type calcifications in the left upper quadrant of the abdomen which appear to correspond with numerous calcified cystic lesions based on comparison with prior CT scans.  IMPRESSION: 1.  Nonobstructive bowel gas pattern. 2.  No pneumoperitoneum. 3.  Support apparatus and postoperative changes, as above. 4.  Air space consolidation and atelectasis in the right middle and lower  lobes, concerning for pneumonia and/or sequelae of aspiration. 5.  Small right and trace left-sided pleural effusions. 6.  Atherosclerosis.  Original Report Authenticated By: Florencia Reasons, M.D.   Dg Foot Complete Left  10/17/2011  *RADIOLOGY REPORT*  Clinical Data: Wound infection  LEFT FOOT - COMPLETE 3+ VIEW  Comparison: None.  Findings: The patient is status post amputation of the foot beyond the proximal aspect of the five metatarsals.  No acute fracture. No dislocation.  No periosteal reaction.  No destructive bone lesion.  Vascular calcifications are prominent.  An open wound at the amputation site is noted.  IMPRESSION: Status post forefoot amputation.  Open wound is present at the amputation site.  No acute bony pathology.  Original Report Authenticated By: Donavan Burnet, M.D.    Scheduled Meds:    . alteplase  4 mg Intracatheter Once  . darbepoetin (ARANESP) injection - DIALYSIS  100 mcg Intravenous Q Mon-HD  . dextrose  25 mL Intravenous Once  . dextrose  25 mL Intravenous Once  . feeding supplement (NEPRO CARB STEADY)  237 mL Oral BID BM  . gabapentin  200 mg Oral Daily  . heparin subcutaneous  5,000 Units Subcutaneous Q8H  . isosorbide mononitrate  30 mg Oral Daily  . lidocaine-EPINEPHrine  10 mL Intradermal Once  . metroNIDAZOLE  500 mg Oral Q8H  . piperacillin-tazobactam (ZOSYN)  IV  2.25 g Intravenous Q8H  . sodium chloride  3 mL Intravenous Q12H  . vancomycin  125 mg Oral QID   Followed by  . vancomycin  125 mg Oral BID   Followed by  . vancomycin  125 mg Oral Daily   Followed by  . vancomycin  125 mg Oral QODAY   Followed by  . vancomycin  125 mg Oral Q3 days  . vancomycin  750 mg Intravenous Once  . vancomycin  750 mg Intravenous Q M,W,F-HD  . DISCONTD: vancomycin  750 mg Intravenous Q M,W,F-HD   Continuous Infusions:   Principal Problem:  *Scrotal abscess Active Problems:  CKD (chronic kidney disease) stage V requiring chronic dialysis  Hypertension   Peripheral vascular disease, unspecified  C. difficile colitis- recalcitrant  Non-healing surgical wound  Diarrhea  Abdominal wall abscess  Hypoglycemia

## 2011-11-01 NOTE — Progress Notes (Signed)
PTS CATH WOULD NOT RUN WELL DURING HD. AP WERE HIGH DESPITE FLUSHES AND REVERSING THE LINES. CHARLES LYLE, PA AWARE. PT REQUEST TO RUN ON 8-10. TPA DWELLING OVERNIGHT.

## 2011-11-01 NOTE — Anesthesia Postprocedure Evaluation (Signed)
Anesthesia Post Note  Patient: [name]  Procedure(s) Performed: Procedure(s) (LRB): GROIN DEBRIDEMENT (Left)  Anesthesia type: general  Patient location: PACU  Post pain: Pain level controlled  Post assessment: Patient's Cardiovascular Status Stable  Last Vitals:  Filed Vitals:   11/01/11 1215  BP:   Pulse: 81  Temp:   Resp: 17    Post vital signs: Reviewed and stable  Level of consciousness: sedated  Complications: No apparent anesthesia complications   CBG still below normal but Dr. Deno Etienne has agreed to follow the patient for this abnormality after patient leaves the PACU

## 2011-11-01 NOTE — Op Note (Signed)
NAME: CHIBUEZE BEASLEY   MRN: [id number] DOB: Oct 10, 1950    DATE OF OPERATION: 11/01/2011  PREOP DIAGNOSIS: lymphocele left groin  POSTOP DIAGNOSIS: same  PROCEDURE:  1. Exploration of left groin and repair of lymphocele 2. Placement of VAC  SURGEON: Di Kindle. Edilia Bo, MD, FACS  ASSIST: None  ANESTHESIA: Gen.   EBL: minimal  INDICATIONS: [name] is a 61 y.o. male who had a left femoropopliteal bypass graft. He was noted to have some swelling in the left groin and CT scan showed a lymphocele. He is brought for exploration.  FINDINGS: there was a moderate-sized lymphocele in the left groin which was evacuated. The area of lymphatic drainage was identified and ligated. The proximal vein femoropopliteal bypass graft was identified in the base of the wound. I was able to cover this with soft tissue and place a VAC on top of this.  TECHNIQUE: The patient was brought to the operating room and received a general anesthetic. The left groin and thigh were prepped and draped in usual sterile fashion. An elliptical incision was made encompassing the open area at the superior aspect of the incision. The dissection was carried down to the lymphocele which was evacuated. The proximal vein femoropopliteal bypass graft was present at the bottom of the wound and had a good pulse. There was no evidence of gross infection. Intraoperative cultures were sent. There was a small hole in the lymphatics on the lateral aspect of the wound which was clearly leaking lymphatic fluid. I ligated this with a 3-0 Vicryl suture. Next I was able to close soft tissue over the proximal vein graft using a running 3-0 Vicryl with moderate tension. With the vein now covered,  a VAC was placed on top of this and connected to suction with a good seal. Sterile dressing was applied. The patient tolerated the procedure well and was transferred to the recovery room in stable condition. All needle and sponge counts were  correct.  Waverly Ferrari, MD, FACS [facility]  DATE OF DICTATION:   11/01/2011

## 2011-11-01 NOTE — Anesthesia Preprocedure Evaluation (Addendum)
Anesthesia Evaluation  Patient identified by MRN, date of birth, ID band Patient awake    Reviewed: Allergy & Precautions, H&P , NPO status , Patient's Chart, lab work & pertinent test results, reviewed documented beta blocker date and time   Airway Mallampati: II  Neck ROM: Full    Dental  (+) Edentulous Lower and Edentulous Upper   Pulmonary shortness of breath,          Cardiovascular hypertension, + CAD and +CHF + dysrhythmias Atrial Fibrillation     Neuro/Psych CVA    GI/Hepatic GERD-  ,  Endo/Other    Renal/GU Dialysis and CRFRenal disease     Musculoskeletal   Abdominal   Peds  Hematology   Anesthesia Other Findings   Reproductive/Obstetrics                           Anesthesia Physical Anesthesia Plan  ASA: III  Anesthesia Plan: General   Post-op Pain Management:    Induction: Rapid sequence  Airway Management Planned: Oral ETT  Additional Equipment:   Intra-op Plan:   Post-operative Plan: Extubation in OR  Informed Consent: I have reviewed the patients History and Physical, chart, labs and discussed the procedure including the risks, benefits and alternatives for the proposed anesthesia with the patient or authorized representative who has indicated his/her understanding and acceptance.   Dental advisory given  Plan Discussed with: CRNA, Anesthesiologist and Surgeon  Anesthesia Plan Comments:        Anesthesia Quick Evaluation

## 2011-11-01 NOTE — H&P (View-Only) (Signed)
[facility] [place]  Subjective  - POD #1  Status post revision of left transmetatarsal amputation. The patient has no complaints today.   Physical Exam:  Wound VAC is in place. Left groin seroma is draining through a pinpoint incision there is a raised area in the groin no surrounding cellulitis.       Assessment/Plan:  POD #1  The patient had a CT scan performed which showed a fluid collection around his proximal anastomosis. There is air within the fluid collection suggesting infection. I initially planned on the breathing this at the bedside however due to the extensive nature of the collection I feel this would be best performed in the operating room. I discussed this with the patient. This will be scheduled to be performed tomorrow morning by partner Dr. Dickson.  The patient's bypass graft was done with vein. It was a femoral to below knee popliteal artery bypass graft. The vein bypass was used as a patch on the common femoral artery.  BRABHAM IV, V. WELLS 10/31/2011 4:51 PM --  Filed Vitals:   10/31/11 1350  BP: 155/72  Pulse: 79  Temp: 98.4 F (36.9 C)  Resp: 16    Intake/Output Summary (Last 24 hours) at 10/31/11 1651 Last data filed at 10/31/11 1634  Gross per 24 hour  Intake    670 ml  Output      0 ml  Net    670 ml     Laboratory CBC    Component Value Date/Time   WBC 8.2 10/31/2011 0628   HGB 8.3* 10/31/2011 0628   HCT 26.6* 10/31/2011 0628   PLT 223 10/31/2011 0628    BMET    Component Value Date/Time   NA 141 10/31/2011 0628   K 3.5 10/31/2011 0628   CL 102 10/31/2011 0628   CO2 28 10/31/2011 0628   GLUCOSE 113* 10/31/2011 0628   BUN 35* 10/31/2011 0628   CREATININE 5.05* 10/31/2011 0628   CALCIUM 9.0 10/31/2011 0628   GFRNONAA 11* 10/31/2011 0628   GFRAA 13* 10/31/2011 0628    COAG Lab Results  Component Value Date   INR 1.30 09/12/2011   INR 1.38 08/27/2011   INR 1.54* 08/19/2011   No results found for this basename: PTT     Antibiotics Anti-infectives     Start     Dose/Rate Route Frequency Ordered Stop   11/28/11 1000   vancomycin (VANCOCIN) 50 mg/mL oral solution 125 mg        125 mg Oral Every 3 DAYS 10/30/11 0127 12/13/11 0959   11/20/11 1000   vancomycin (VANCOCIN) 50 mg/mL oral solution 125 mg        125 mg Oral Every other day 10/30/11 0127 11/28/11 0959   11/13/11 1000   vancomycin (VANCOCIN) 50 mg/mL oral solution 125 mg        125 mg Oral Daily 10/30/11 0127 11/20/11 0959   11/05/11 2200   vancomycin (VANCOCIN) 50 mg/mL oral solution 125 mg        125 mg Oral 2 times daily 10/30/11 0127 11/12/11 2159   10/31/11 1200   vancomycin (VANCOCIN) 750 mg in sodium chloride 0.9 % 150 mL IVPB        750 mg 150 mL/hr over 60 Minutes Intravenous Every M-W-F (Hemodialysis) 10/30/11 0407     10/30/11 0600  piperacillin-tazobactam (ZOSYN) IVPB 2.25 g       2.25 g 100 mL/hr over 30 Minutes Intravenous Every 8 hours 10/30/11   0407     10/30/11 0415   vancomycin (VANCOCIN) 500 mg in sodium chloride 0.9 % 100 mL IVPB        500 mg 100 mL/hr over 60 Minutes Intravenous  Once 10/30/11 0407 10/30/11 0535   10/30/11 0130   metroNIDAZOLE (FLAGYL) tablet 500 mg        500 mg Oral 3 times per day 10/30/11 0127 11/12/11 2159   10/30/11 0130   vancomycin (VANCOCIN) 50 mg/mL oral solution 125 mg        125 mg Oral 4 times daily 10/30/11 0127 11/05/11 2159   10/30/11 0115   vancomycin (VANCOCIN) IVPB 1000 mg/200 mL premix        1,000 mg 200 mL/hr over 60 Minutes Intravenous  Once 10/30/11 0101 10/30/11 0343   10/30/11 0100   Ampicillin-Sulbactam (UNASYN) 3 g in sodium chloride 0.9 % 100 mL IVPB        3 g 100 mL/hr over 60 Minutes Intravenous  Once 10/30/11 0047 10/30/11 0214   10/30/11 0100   ciprofloxacin (CIPRO) IVPB 400 mg        400 mg 200 mL/hr over 60 Minutes Intravenous  Once 10/30/11 0057 10/30/11 0103           V. Wells Brabham IV, M.D. [facility] [place] Office:  [phone] Pager:  [phone]  

## 2011-11-01 NOTE — Preoperative (Signed)
Beta Blockers   Reason not to administer Beta Blockers:Not Applicable 

## 2011-11-01 NOTE — Transfer of Care (Signed)
Immediate Anesthesia Transfer of Care Note  Patient: [name]  Procedure(s) Performed: Procedure(s) (LRB): GROIN DEBRIDEMENT (Left)  Patient Location: PACU  Anesthesia Type: General  Level of Consciousness: awake, alert  and oriented  Airway & Oxygen Therapy: Patient Spontanous Breathing and Patient connected to nasal cannula oxygen  Post-op Assessment: Report given to PACU RN and Post -op Vital signs reviewed and stable  Post vital signs: Reviewed and stable  Complications: No apparent anesthesia complications

## 2011-11-01 NOTE — Interval H&P Note (Signed)
History and Physical Interval Note:  11/01/2011 10:25 AM  [name]  has presented today for surgery, with the diagnosis of Groin abscess  The various methods of treatment have been discussed with the patient and family. After consideration of risks, benefits and other options for treatment, the patient has consented to: GROIN DEBRIDEMENT (Left) as a surgical intervention .  The patient's history has been reviewed, patient examined, no change in status, stable for surgery.  I have reviewed the patient's chart and labs.  Questions were answered to the patient's satisfaction.     [name] S

## 2011-11-01 NOTE — Progress Notes (Signed)
Subjective:  No new complaints today.  Watching the [employer]. Plan is for debridement of left groin wound in the OR today followed by HD  Objective Vital signs in last 24 hours: Filed Vitals:   10/31/11 1350 10/31/11 2000 11/01/11 0034 11/01/11 0653  BP: 155/72 126/47 112/61 170/47  Pulse: 79 79 94 89  Temp: 98.4 F (36.9 C) 98.6 F (37 C) 98.6 F (37 C) 99.1 F (37.3 C)  TempSrc: Oral Oral Oral Oral  Resp: 16 20 18 20   Height:      Weight:    65.2 kg (143 lb 11.8 oz)  SpO2: 94% 98% 94% 95%   Weight change: 9.2 kg (20 lb 4.5 oz)  Intake/Output Summary (Last 24 hours) at 11/01/11 0759 Last data filed at 11/01/11 0657  Gross per 24 hour  Intake    610 ml  Output      0 ml  Net    610 ml   Labs: Basic Metabolic Panel:  Lab 10/31/11 1610 10/30/11 1200 10/29/11 2027  NA 141 142 139  K 3.5 3.3* 3.4*  CL 102 103 102  CO2 28 26 25   GLUCOSE 113* 60* 92  BUN 35* 26* 21  CREATININE 5.05* 4.20* 3.65*  CALCIUM 9.0 9.0 9.1  ALB -- -- --  PHOS -- -- --   Liver Function Tests: No results found for this basename: AST:3,ALT:3,ALKPHOS:3,BILITOT:3,PROT:3,ALBUMIN:3 in the last 168 hours No results found for this basename: LIPASE:3,AMYLASE:3 in the last 168 hours No results found for this basename: AMMONIA:3 in the last 168 hours CBC:  Lab 10/31/11 0628 10/30/11 1200 10/29/11 2027  WBC 8.2 9.0 10.0  NEUTROABS 6.4 -- 7.7  HGB 8.3* 9.3* 10.4*  HCT 26.6* 31.1* 34.0*  MCV 98.9 102.0* 102.7*  PLT 223 193 184   Cardiac Enzymes: No results found for this basename: CKTOTAL:5,CKMB:5,CKMBINDEX:5,TROPONINI:5 in the last 168 hours CBG: No results found for this basename: GLUCAP:5 in the last 168 hours  Iron Studies: No results found for this basename: IRON,TIBC,TRANSFERRIN,FERRITIN in the last 72 hours Studies/Results: Ct Abdomen Pelvis W Contrast  10/31/2011  *RADIOLOGY REPORT*  Clinical Data: Abdominal pain.  Nausea and vomiting.  Evaluate masses.  CT ABDOMEN AND PELVIS WITH  CONTRAST  Technique:  Multidetector CT imaging of the abdomen and pelvis was performed following the standard protocol during bolus administration of intravenous contrast.  Contrast: 80mL OMNIPAQUE IOHEXOL 300 MG/ML  SOLN  Comparison: 08/27/2011  Findings: Small bilateral pleural effusions with basilar atelectasis, demonstrating mild improvement since the previous study.  The liver parenchyma is homogeneous.  The spleen appears enlarged.  The gallbladder is poorly defined and is likely contracted.  There is infiltration throughout the subcutaneous and intra-abdominal fat consistent with edema.  No discrete fluid is demonstrated.  The pancreas and adrenal glands are unremarkable. There is extensive atherosclerotic calcification throughout the aortoiliac vessels and branch vessels.  Multicystic kidneys with multiple calcified lesions, likely cysts bilaterally.  No hydronephrosis.  Minimal if any visualized nephrograms.  Diffuse calcification of the renal arteries.  No significant change since previous study.  Dense calcifications in the celiac axis, superior mesenteric artery, and proximal iliac arteries likely to represent a stenosis.  Flow is demonstrated in these vessels.  Esophagus is distended with contrast material.  This could be due to reflux or dysmotility.  The stomach is distended by ingested material.  No wall thickening.  The small and large bowel do not appear significantly distended.  No free air in the abdomen.  Surgical changes in the left anterior abdominal wall.  Pelvis:  Free fluid in the pelvis with density measurements suggesting ascites.  Prostatic enlargement.  Mild thickening of bladder wall.  In the left groin region, there are surgical clips.  There is a loculated gas and fluid collection in the left groin measuring 4.3 x 4.5 cm with a thick enhancing wall and adjacent enlarged enhancing lymph nodes.  This is consistent with abscess.  The abscess extends down anteriorly and to the abductor  muscle region. This is new since the previous study.  Degenerative changes in the lumbar spine.  Lucencies in the femoral head and acetabular regions bilaterally likely representing degenerative cysts and stable since previous study.  IMPRESSION: 1. Since the previous study, there is interval development of an abscess with adjacent inflammatory lymph nodes in the left groin region.  2. Decreasing bilateral pleural effusions and basilar atelectasis. 3.  Multiple additional findings are stable since previous study.  Results were discussed by telephone at the time of dictation, 0150 hours on 10/31/2011, with the patient's nurse, Lanora Manis, on [facility].  Original Report Authenticated By: Marlon Pel, M.D.   Medications: Infusions:    Scheduled Medications:    . darbepoetin (ARANESP) injection - DIALYSIS  100 mcg Intravenous Q Mon-HD  . feeding supplement (NEPRO CARB STEADY)  237 mL Oral BID BM  . gabapentin  200 mg Oral Daily  . isosorbide mononitrate  30 mg Oral Daily  . lidocaine-EPINEPHrine  10 mL Intradermal Once  . metroNIDAZOLE  500 mg Oral Q8H  . piperacillin-tazobactam (ZOSYN)  IV  2.25 g Intravenous Q8H  . sodium chloride  3 mL Intravenous Q12H  . vancomycin  125 mg Oral QID   Followed by  . vancomycin  125 mg Oral BID   Followed by  . vancomycin  125 mg Oral Daily   Followed by  . vancomycin  125 mg Oral QODAY   Followed by  . vancomycin  125 mg Oral Q3 days  . vancomycin  750 mg Intravenous Q M,W,F-HD    have reviewed scheduled and prn medications.  Physical Exam: General: chronically ill appearing in air bed.  NAD Heart: RRR Lungs: anteriorly clear Abdomen: soft, nontender Extremities: Left groin wound which appears benign, chronically draining but CT showed deep infection requiring OR debridement.  Dialysis Access: L sided chest catheter.    I Assessment/ Plan: Pt is a 61 y.o. yo male with ESRD who was admitted on 10/29/2011 with  Poorly healing left transmet  wound and continued drainage of left groin wound.   Assessment/Plan:  1. Wounds- poorly healing left transmet s/p vac placement on 8/8.  Left groin wound with CT showing extensive and deep involvement.  For OR debridement today.  On flagyl and zosyn, followed closely by vascular.  2. ESRD- [facility] MWF via PC.  Plan for HD today, then Monday to get back on schedule 3. Anemia- due to CKD and blood loss, trending down.   Started aranesp and will check iron stores today.   4. Secondary hyperparathyroidism- checking labs today to guide me.  His OP dialysis unit will not be open again until Monday to get info as far as meds 5. HTN/volume- variable but mostly reasonable.  No BP meds for now.    Mikaelah Trostle A   11/01/2011,7:59 AM  LOS: 3 days

## 2011-11-01 NOTE — Progress Notes (Signed)
Hypoglycemic Event  CBG:50  Treatment: 15 GM carbohydrate snack  Symptoms: None  Follow-up CBG: Time 1503 CBG Result:105  Possible Reasons for Event: Inadequate meal intake  Comments/MD notified yes    Vence Lalor Czarnecki  Remember to initiate Hypoglycemia Order Set & complete

## 2011-11-01 NOTE — Progress Notes (Signed)
2 Days Post-Op  Subjective:  1 - Scrotal Abscess - afebrile on emperic ABX, cultures negative. Able to express some residual pus from scrotum today as well as some simple serous fluid from groin.    Today millard is w/o complaints. No specific wound problems.  Objective: Vital signs in last 24 hours: Temp:  [98.4 F (36.9 C)-99.1 F (37.3 C)] 99.1 F (37.3 C) (08/10 0653) Pulse Rate:  [79-94] 89  (08/10 0653) Resp:  [16-20] 20  (08/10 0653) BP: (112-170)/(47-72) 170/47 mmHg (08/10 0653) SpO2:  [94 %-98 %] 95 % (08/10 0653) Weight:  [65.2 kg (143 lb 11.8 oz)] 65.2 kg (143 lb 11.8 oz) (08/10 0653) Last BM Date: 10/29/11  Intake/Output from previous day: 08/09 0701 - 08/10 0700 In: 610 [P.O.:460; IV Piggyback:150] Out: -  Intake/Output this shift:    General appearance: alert and cooperative Head: Normocephalic, without obvious abnormality, atraumatic Back: symmetric, no curvature. ROM normal. No CVA tenderness. Resp: clear to auscultation bilaterally Cardio: regular rate and rhythm, S1, S2 normal, no murmur, click, rub or gallop GI: soft, non-tender; bowel sounds normal; no masses,  no organomegaly Male genitalia: Left groin seroma drained at bedside. Scrotal wound with miniimal residual fluid collection. Extremities: extremities normal, atraumatic, no cyanosis or edema Pulses: 2+ and symmetric Neurologic: Grossly normal Incision/Wound:  Lab Results:   Basename 10/31/11 0628 10/30/11 1200  WBC 8.2 9.0  HGB 8.3* 9.3*  HCT 26.6* 31.1*  PLT 223 193   BMET  Basename 10/31/11 0628 10/30/11 1200  NA 141 142  K 3.5 3.3*  CL 102 103  CO2 28 26  GLUCOSE 113* 60*  BUN 35* 26*  CREATININE 5.05* 4.20*  CALCIUM 9.0 9.0   PT/INR No results found for this basename: LABPROT:2,INR:2 in the last 72 hours ABG No results found for this basename: PHART:2,PCO2:2,PO2:2,HCO3:2 in the last 72 hours  Studies/Results: Ct Abdomen Pelvis W Contrast  10/31/2011  *RADIOLOGY REPORT*   Clinical Data: Abdominal pain.  Nausea and vomiting.  Evaluate masses.  CT ABDOMEN AND PELVIS WITH CONTRAST  Technique:  Multidetector CT imaging of the abdomen and pelvis was performed following the standard protocol during bolus administration of intravenous contrast.  Contrast: 80mL OMNIPAQUE IOHEXOL 300 MG/ML  SOLN  Comparison: 08/27/2011  Findings: Small bilateral pleural effusions with basilar atelectasis, demonstrating mild improvement since the previous study.  The liver parenchyma is homogeneous.  The spleen appears enlarged.  The gallbladder is poorly defined and is likely contracted.  There is infiltration throughout the subcutaneous and intra-abdominal fat consistent with edema.  No discrete fluid is demonstrated.  The pancreas and adrenal glands are unremarkable. There is extensive atherosclerotic calcification throughout the aortoiliac vessels and branch vessels.  Multicystic kidneys with multiple calcified lesions, likely cysts bilaterally.  No hydronephrosis.  Minimal if any visualized nephrograms.  Diffuse calcification of the renal arteries.  No significant change since previous study.  Dense calcifications in the celiac axis, superior mesenteric artery, and proximal iliac arteries likely to represent a stenosis.  Flow is demonstrated in these vessels.  Esophagus is distended with contrast material.  This could be due to reflux or dysmotility.  The stomach is distended by ingested material.  No wall thickening.  The small and large bowel do not appear significantly distended.  No free air in the abdomen.  Surgical changes in the left anterior abdominal wall.  Pelvis:  Free fluid in the pelvis with density measurements suggesting ascites.  Prostatic enlargement.  Mild thickening of bladder wall.  In the left groin region, there are surgical clips.  There is a loculated gas and fluid collection in the left groin measuring 4.3 x 4.5 cm with a thick enhancing wall and adjacent enlarged enhancing lymph  nodes.  This is consistent with abscess.  The abscess extends down anteriorly and to the abductor muscle region. This is new since the previous study.  Degenerative changes in the lumbar spine.  Lucencies in the femoral head and acetabular regions bilaterally likely representing degenerative cysts and stable since previous study.  IMPRESSION: 1. Since the previous study, there is interval development of an abscess with adjacent inflammatory lymph nodes in the left groin region.  2. Decreasing bilateral pleural effusions and basilar atelectasis. 3.  Multiple additional findings are stable since previous study.  Results were discussed by telephone at the time of dictation, 0150 hours on 10/31/2011, with the patient's nurse, Lanora Manis, on [facility].  Original Report Authenticated By: Marlon Pel, M.D.    Anti-infectives: Anti-infectives     Start     Dose/Rate Route Frequency Ordered Stop   11/28/11 1000   vancomycin (VANCOCIN) 50 mg/mL oral solution 125 mg        125 mg Oral Every 3 DAYS 10/30/11 0127 12/13/11 0959   11/20/11 1000   vancomycin (VANCOCIN) 50 mg/mL oral solution 125 mg        125 mg Oral Every other day 10/30/11 0127 11/28/11 0959   11/13/11 1000   vancomycin (VANCOCIN) 50 mg/mL oral solution 125 mg        125 mg Oral Daily 10/30/11 0127 11/20/11 0959   11/05/11 2200   vancomycin (VANCOCIN) 50 mg/mL oral solution 125 mg        125 mg Oral 2 times daily 10/30/11 0127 11/12/11 2159   10/31/11 1200   vancomycin (VANCOCIN) 750 mg in sodium chloride 0.9 % 150 mL IVPB        750 mg 150 mL/hr over 60 Minutes Intravenous Every M-W-F (Hemodialysis) 10/30/11 0407     10/30/11 0600   piperacillin-tazobactam (ZOSYN) IVPB 2.25 g        2.25 g 100 mL/hr over 30 Minutes Intravenous Every 8 hours 10/30/11 0407     10/30/11 0415   vancomycin (VANCOCIN) 500 mg in sodium chloride 0.9 % 100 mL IVPB        500 mg 100 mL/hr over 60 Minutes Intravenous  Once 10/30/11 0407 10/30/11 0535    10/30/11 0130   metroNIDAZOLE (FLAGYL) tablet 500 mg        500 mg Oral 3 times per day 10/30/11 0127 11/12/11 2159   10/30/11 0130   vancomycin (VANCOCIN) 50 mg/mL oral solution 125 mg        125 mg Oral 4 times daily 10/30/11 0127 11/05/11 2159   10/30/11 0115   vancomycin (VANCOCIN) IVPB 1000 mg/200 mL premix        1,000 mg 200 mL/hr over 60 Minutes Intravenous  Once 10/30/11 0101 10/30/11 0343   10/30/11 0100   Ampicillin-Sulbactam (UNASYN) 3 g in sodium chloride 0.9 % 100 mL IVPB        3 g 100 mL/hr over 60 Minutes Intravenous  Once 10/30/11 0047 10/30/11 0214   10/30/11 0100   ciprofloxacin (CIPRO) IVPB 400 mg        400 mg 200 mL/hr over 60 Minutes Intravenous  Once 10/30/11 0057 10/30/11 0103          Assessment/Plan: 1 - Scrotal Abscess - Improving  clinically, agree with emperic ABX regimen. RX'd formal daily dressing changes to groin with changes of packing. No need for further surgical debridement at this time.   LOS: 3 days    [facility], Ruthia Person 11/01/2011

## 2011-11-02 ENCOUNTER — Inpatient Hospital Stay (HOSPITAL_COMMUNITY): Payer: Medicare Other

## 2011-11-02 DIAGNOSIS — B952 Enterococcus as the cause of diseases classified elsewhere: Secondary | ICD-10-CM

## 2011-11-02 DIAGNOSIS — A491 Streptococcal infection, unspecified site: Secondary | ICD-10-CM | POA: Diagnosis present

## 2011-11-02 LAB — GLUCOSE, CAPILLARY
Glucose-Capillary: 90 mg/dL (ref 70–99)
Glucose-Capillary: 36 mg/dL — CL (ref 70–99)
Glucose-Capillary: 90 mg/dL (ref 70–99)
Glucose-Capillary: 67 mg/dL — ABNORMAL LOW (ref 70–99)
Glucose-Capillary: 105 mg/dL — ABNORMAL HIGH (ref 70–99)

## 2011-11-02 LAB — CBC
Hemoglobin: 8.2 g/dL — ABNORMAL LOW (ref 13.0–17.0)
RBC: 2.71 MIL/uL — ABNORMAL LOW (ref 4.22–5.81)
WBC: 15.6 10*3/uL — ABNORMAL HIGH (ref 4.0–10.5)

## 2011-11-02 LAB — RENAL FUNCTION PANEL
CO2: 25 mEq/L (ref 19–32)
Chloride: 101 mEq/L (ref 96–112)
GFR calc Af Amer: 10 mL/min — ABNORMAL LOW (ref >90)
Glucose, Bld: 114 mg/dL — ABNORMAL HIGH (ref 70–99)
Potassium: 4.3 mEq/L (ref 3.5–5.1)
Sodium: 139 mEq/L (ref 135–145)

## 2011-11-02 LAB — CULTURE, ROUTINE-ABSCESS

## 2011-11-02 MED ORDER — PROMETHAZINE HCL 25 MG/ML IJ SOLN
12.5000 mg | Freq: Four times a day (QID) | INTRAMUSCULAR | Status: DC | PRN
  Administered 2011-11-03: 12.5 mg via INTRAVENOUS
  Filled 2011-11-02: qty 1

## 2011-11-02 MED ORDER — VANCOMYCIN HCL 1000 MG IV SOLR
750.0000 mg | Freq: Once | INTRAVENOUS | Status: AC
  Administered 2011-11-02: 750 mg via INTRAVENOUS
  Filled 2011-11-02: qty 750

## 2011-11-02 MED ORDER — PANTOPRAZOLE SODIUM 40 MG PO TBEC
40.0000 mg | DELAYED_RELEASE_TABLET | Freq: Every day | ORAL | Status: DC
  Administered 2011-11-02 – 2011-11-04 (×3): 40 mg via ORAL
  Filled 2011-11-02 (×3): qty 1

## 2011-11-02 MED ORDER — ASPIRIN 81 MG PO CHEW
81.0000 mg | CHEWABLE_TABLET | Freq: Every day | ORAL | Status: DC
  Administered 2011-11-03 – 2011-11-05 (×3): 81 mg via ORAL
  Filled 2011-11-02 (×4): qty 1

## 2011-11-02 MED ORDER — SIMVASTATIN 5 MG PO TABS
5.0000 mg | ORAL_TABLET | Freq: Every day | ORAL | Status: DC
  Administered 2011-11-03 – 2011-11-06 (×4): 5 mg via ORAL
  Filled 2011-11-02 (×6): qty 1

## 2011-11-02 MED ORDER — CALCIUM CARBONATE ANTACID 500 MG PO CHEW
1.0000 | CHEWABLE_TABLET | Freq: Four times a day (QID) | ORAL | Status: DC | PRN
  Administered 2011-11-02: 200 mg via ORAL
  Filled 2011-11-02 (×2): qty 1

## 2011-11-02 MED ORDER — RENA-VITE PO TABS
1.0000 | ORAL_TABLET | Freq: Every day | ORAL | Status: DC
  Administered 2011-11-03 – 2011-11-06 (×4): 1 via ORAL
  Filled 2011-11-02 (×6): qty 1

## 2011-11-02 MED ORDER — OXYCODONE-ACETAMINOPHEN 5-325 MG PO TABS
ORAL_TABLET | ORAL | Status: AC
  Filled 2011-11-02: qty 2

## 2011-11-02 MED ORDER — HEPARIN SODIUM (PORCINE) 1000 UNIT/ML DIALYSIS
20.0000 [IU]/kg | INTRAMUSCULAR | Status: DC | PRN
  Filled 2011-11-02: qty 2

## 2011-11-02 MED ORDER — DEXTROSE 50 % IV SOLN
INTRAVENOUS | Status: AC
  Filled 2011-11-02: qty 50

## 2011-11-02 MED ORDER — SEVELAMER CARBONATE 800 MG PO TABS
1600.0000 mg | ORAL_TABLET | Freq: Three times a day (TID) | ORAL | Status: DC
  Administered 2011-11-03 – 2011-11-07 (×6): 1600 mg via ORAL
  Filled 2011-11-02 (×18): qty 2

## 2011-11-02 NOTE — Progress Notes (Signed)
[facility] PROGRESS NOTE  [name] RUE:[id number] DOB: 1951/02/19 DOA: 10/29/2011 PCP: Toma Deiters, MD  Assessment/Plan: Principal Problem:  *Scrotal abscess Active Problems:  CKD (chronic kidney disease) stage V requiring chronic dialysis  Hypertension  Peripheral vascular disease, unspecified  C. difficile colitis- recalcitrant  Non-healing surgical wound  Diarrhea  1. Hypoglycemia: Patient continues to have episodes of hypoglycemia. Although he remains asymptomatic my concern is that this may be a reflection of early sepsis. We'll continue to monitor his blood sugars. Also send her insulin levels as well as C-peptide levels. 2. Scrotal Abscess: S/P I&D. Cultures show moderate enterococcus sensitive to vancomycin and ampicillin. I will continue vancomycin and Zosyn once the wound culture from groin shows no growth for 48 hours. 3. Fluid collection in Groin: Patient underwent exploration of left groin and repair of lymphocele. He also had placement of wound VAC today. I will defer to  vascular surgery for further management of lymphocele. 4.  ESRD: HD M/W/F continue per nephrology     5. PVD/ nonhealing surgical wound: Pt S/P debridement and placemnt of wound vac. 6. Diarrhea: Patient has had no further diarrhea since admission. Oral vancomycin and Flagyl discontinued yesterday 7. Disposition: This patient will likely need to go to LTAC prior to his discharged home. Code Status: Full Code Family Communication: N/A Disposition Plan: Not yet determined  Earl Pilot A.  [facility] Pager [phone]. If 8PM-8AM, please contact night-coverage at [email or web address], password [facility] 11/02/2011, 7:11 PM  LOS: 4 days   Brief narrative: [name] is a 61 year old male with end stage renal disease and peripheral vascular disease presented to the emergency room at [facility] with a four-day history of left hemiscrotal pain. He was seen and evaluated there. He states that  he has had a previous scrotal abscess that he described as a "boil" that he popped himself in the past and which healed completly. He complains of discomfort with any movement of the scrotum, however he has not had any fevers.   Consultants:  Dr. Myra Gianotti- [facility]  Urology- [place]  Procedures:  S/P shunt transmetatarsal amputation and placement of wound VAC  Antibiotics:  Flagyl 8/8 >> 8/10  Oral Vancomycin 8/8 >> 8/10  IV vancomycin 8/8 >>  Zosyn 8/8 >>  HPI/Subjective: Patient with no complaints.  Objective: Filed Vitals:   11/02/11 1100 11/02/11 1132 11/02/11 1500 11/02/11 1800  BP: 124/59 132/74 155/67 146/72  Pulse: 83 78 88 83  Temp:  97.4 F (36.3 C) 99.1 F (37.3 C) 98.8 F (37.1 C)  TempSrc:  Oral    Resp: 16 14 16 14   Height:      Weight:  51.3 kg (113 lb 1.5 oz)    SpO2:  100% 96% 98%   Weight change: -10.7 kg (-23 lb 9.4 oz)  Intake/Output Summary (Last 24 hours) at 11/02/11 1911 Last data filed at 11/02/11 1500  Gross per 24 hour  Intake    730 ml  Output   2750 ml  Net  -2020 ml    General: Alert, awake, oriented x3, in no acute distress.  Neck: Trachea midline,  no masses, no thyromegal,y no JVD, no carotid bruit OROPHARYNX:  Moist, No exudate/ erythema/lesions.  Heart: Regular rate and rhythm, without murmurs, rubs, gallops, PMI non-displaced, no heaves or thrills on palpation.  Lungs: Clear to auscultation, no wheezing or rhonchi noted.   Abdomen: Soft. Patient has wound VAC in place in the left groin area. Genitalia: Patient has minimal drainage from his scrotal  abscess today.  Data Reviewed: Basic Metabolic Panel:  Lab 11/02/11 2841 11/01/11 1630 10/31/11 0628 10/30/11 1200 10/29/11 2027  NA 139 138 141 142 139  K 4.3 4.3 3.5 3.3* 3.4*  CL 101 101 102 103 102  CO2 25 25 28 26 25   GLUCOSE 114* 107* 113* 60* 92  BUN 41* 43* 35* 26* 21  CREATININE 6.02* 6.33* 5.05* 4.20* 3.65*  CALCIUM 9.3 9.4 9.0 9.0 9.1  MG -- -- -- -- --  PHOS  7.1* 7.0* -- -- --   Liver Function Tests:  Lab 11/02/11 0701 11/01/11 1630  AST -- --  ALT -- --  ALKPHOS -- --  BILITOT -- --  PROT -- --  ALBUMIN 1.5* 1.6*   No results found for this basename: LIPASE:5,AMYLASE:5 in the last 168 hours No results found for this basename: AMMONIA:5 in the last 168 hours CBC:  Lab 11/02/11 0700 11/01/11 1630 10/31/11 0628 10/30/11 1200 10/29/11 2027  WBC 15.6* 12.1* 8.2 9.0 10.0  NEUTROABS -- -- 6.4 -- 7.7  HGB 8.2* 7.9* 8.3* 9.3* 10.4*  HCT 26.4* 25.5* 26.6* 31.1* 34.0*  MCV 97.4 97.7 98.9 102.0* 102.7*  PLT 296 244 223 193 184   Cardiac Enzymes: No results found for this basename: CKTOTAL:5,CKMB:5,CKMBINDEX:5,TROPONINI:5 in the last 168 hours BNP (last 3 results)  Basename 08/28/11 0540 08/27/11 0930 08/26/11 2305  PROBNP 21860.0* 19652.0* 25900.0*   CBG:  Lab 11/02/11 1657 11/02/11 1656 11/02/11 1553 11/02/11 1436 11/02/11 1223  GLUCAP 105* 26* 67* 90 95    Recent Results (from the past 240 hour(s))  STOOL CULTURE     Status: Normal (Preliminary result)   Collection Time   10/29/11  8:12 PM      Component Value Range Status Comment   Specimen Description STOOL   Final    Special Requests NONE   Final    Culture NO SUSPICIOUS COLONIES, CONTINUING TO HOLD   Final    Report Status PENDING   Incomplete   SURGICAL PCR SCREEN     Status: Normal   Collection Time   10/30/11  6:48 AM      Component Value Range Status Comment   MRSA, PCR NEGATIVE  NEGATIVE Final    Staphylococcus aureus NEGATIVE  NEGATIVE Final   CULTURE, ROUTINE-ABSCESS     Status: Normal   Collection Time   10/30/11  6:27 PM      Component Value Range Status Comment   Specimen Description ABSCESS SCROTUM   Final    Special Requests NONE   Final    Gram Stain     Final    Value: NO WBC SEEN     FEW SQUAMOUS EPITHELIAL CELLS PRESENT     FEW GRAM POSITIVE COCCI IN PAIRS   Culture MODERATE ENTEROCOCCUS SPECIES   Final    Report Status 11/02/2011 FINAL   Final     Organism ID, Bacteria ENTEROCOCCUS SPECIES   Final   WOUND CULTURE     Status: Normal (Preliminary result)   Collection Time   11/01/11 12:28 PM      Component Value Range Status Comment   Specimen Description WOUND LEFT GROIN   Final    Special Requests SWABED FLUID FROM GROIN, PT ON VANCO,ZOSYN,FLAGYL   Final    Gram Stain PENDING   Incomplete    Culture NO GROWTH 1 DAY   Final    Report Status PENDING   Incomplete   ANAEROBIC CULTURE     Status: Normal (  Preliminary result)   Collection Time   11/01/11 12:28 PM      Component Value Range Status Comment   Specimen Description WOUND LEFT GROIN   Final    Special Requests SWABED FLUID FROM GROIN, PT ON VANCO,ZOSYN,FLAGYL   Final    Gram Stain PENDING   Incomplete    Culture     Final    Value: NO ANAEROBES ISOLATED; CULTURE IN PROGRESS FOR 5 DAYS   Report Status PENDING   Incomplete      Studies: Portable Chest 1 View  10/12/2011  *RADIOLOGY REPORT*  Clinical Data: SOB, evaluate for pneumonia  PORTABLE CHEST - 1 VIEW  Comparison: 10/11/2011  Findings: Cardiomegaly with mild interstitial edema and moderate right pleural effusion.  Suspected small left pleural effusion.  Associated right lower lobe opacity, likely atelectasis, underlying pneumonia not excluded.  No pneumothorax.  Stable left IJ venous catheter.  IMPRESSION: Cardiomegaly with mild interstitial edema and moderate right pleural effusion.  Suspected small left pleural effusion.  Associate right lower lobe opacity, likely atelectasis, underlying pneumonia not excluded.  Original Report Authenticated By: Charline Bills, M.D.   Dg Abd Acute W/chest  10/11/2011  *RADIOLOGY REPORT*  Clinical Data: Abdominal pain, nausea and vomiting.  ACUTE ABDOMEN SERIES (ABDOMEN 2 VIEW & CHEST 1 VIEW)  Comparison: Abdominal radiographs 08/28/2011.  Chest x-ray 09/10/2011.  Findings: Left internal jugular PermCath with the tips terminating in the right atrium and at the superior cavoatrial junction  is unchanged.  Lung volumes are low.  Linear opacities in the left mid and lower lung are most consistent with atelectasis.  Patchy airspace consolidation and volume loss in the right middle and lower lobes.  Trace left and small right-sided pleural effusions. No pneumoperitoneum.  Supine and upright views of the abdomen demonstrate gas and stool scattered throughout the colon extending to the level of the distal rectum.  No pathologic distension of small bowel is noted.  Midline surgical staples are again noted.  Numerous vascular calcifications.  In addition, there are numerous egg-shell type calcifications in the left upper quadrant of the abdomen which appear to correspond with numerous calcified cystic lesions based on comparison with prior CT scans.  IMPRESSION: 1.  Nonobstructive bowel gas pattern. 2.  No pneumoperitoneum. 3.  Support apparatus and postoperative changes, as above. 4.  Air space consolidation and atelectasis in the right middle and lower lobes, concerning for pneumonia and/or sequelae of aspiration. 5.  Small right and trace left-sided pleural effusions. 6.  Atherosclerosis.  Original Report Authenticated By: Florencia Reasons, M.D.   Dg Foot Complete Left  10/17/2011  *RADIOLOGY REPORT*  Clinical Data: Wound infection  LEFT FOOT - COMPLETE 3+ VIEW  Comparison: None.  Findings: The patient is status post amputation of the foot beyond the proximal aspect of the five metatarsals.  No acute fracture. No dislocation.  No periosteal reaction.  No destructive bone lesion.  Vascular calcifications are prominent.  An open wound at the amputation site is noted.  IMPRESSION: Status post forefoot amputation.  Open wound is present at the amputation site.  No acute bony pathology.  Original Report Authenticated By: Donavan Burnet, M.D.    Scheduled Meds:    . alteplase  4 mg Intracatheter Once  . darbepoetin (ARANESP) injection - DIALYSIS  100 mcg Intravenous Q Mon-HD  . feeding supplement  (NEPRO CARB STEADY)  237 mL Oral BID BM  . gabapentin  200 mg Oral Daily  . heparin subcutaneous  5,000 Units  Subcutaneous Q8H  . isosorbide mononitrate  30 mg Oral Daily  . lidocaine-EPINEPHrine  10 mL Intradermal Once  . oxyCODONE-acetaminophen      . pantoprazole  40 mg Oral Q1200  . piperacillin-tazobactam (ZOSYN)  IV  2.25 g Intravenous Q8H  . sevelamer  1,600 mg Oral TID WC  . sodium chloride  3 mL Intravenous Q12H  . vancomycin  750 mg Intravenous Q M,W,F-HD  . vancomycin  750 mg Intravenous Once  . DISCONTD: metroNIDAZOLE  500 mg Oral Q8H  . DISCONTD: vancomycin  125 mg Oral QID  . DISCONTD: vancomycin  125 mg Oral BID  . DISCONTD: vancomycin  125 mg Oral Daily  . DISCONTD: vancomycin  125 mg Oral QODAY  . DISCONTD: vancomycin  125 mg Oral Q3 days  . DISCONTD: vancomycin  750 mg Intravenous Once   Continuous Infusions:   Principal Problem:  *Scrotal abscess Active Problems:  CKD (chronic kidney disease) stage V requiring chronic dialysis  Hypertension  Peripheral vascular disease, unspecified  C. difficile colitis- recalcitrant  ESRD on hemodialysis  Non-healing surgical wound  Diarrhea  Hypoglycemia  Lymphocele

## 2011-11-02 NOTE — Progress Notes (Signed)
Took patient's CBG from right hand and had a reading of 68. Pt had 4 oz of juice and retook on right hand in 15 minutes and had a CBG of 26. Pt was asymptomatic throughout. Immediately took CBG from left hand and had a reading of 102. Will continue to take CBG readings from left hand due to incorrect readings on right hand.   Peter Congo RN

## 2011-11-02 NOTE — Progress Notes (Signed)
Hypoglycemic Event  CBG: 36  Treatment: 15 GM carbohydrate snack plus 15 gm snack  Symptoms: None  Follow-up CBG: Time: 1225 CBG Result:95  Possible Reasons for Event: Inadequate meal intake  Comments/MD notified: Yes    Lilian Fuhs Czarnecki  Remember to initiate Hypoglycemia Order Set & complete

## 2011-11-02 NOTE — Progress Notes (Signed)
VASCULAR PROGRESS NOTE  SUBJECTIVE: Comfortable. Currently on HD.  PHYSICAL EXAM: Filed Vitals:   11/02/11 0619 11/02/11 0648 11/02/11 0700 11/02/11 0731  BP: 132/68 119/57 123/64 125/69  Pulse: 92 91 89 83  Temp: 98 F (36.7 C)     TempSrc: Oral     Resp: 16 15 14 17   Height:      Weight: 122 lb 2.2 oz (55.4 kg)     SpO2: 98% 94% 96% 100%   VAC in groin with good seal VAC on TMA with good seal  LABS: Lab Results  Component Value Date   WBC 15.6* 11/02/2011   HGB 8.2* 11/02/2011   HCT 26.4* 11/02/2011   MCV 97.4 11/02/2011   PLT 296 11/02/2011   Lab Results  Component Value Date   CREATININE 6.33* 11/01/2011   Lab Results  Component Value Date   INR 1.30 09/12/2011   CBG (last 3)   Basename 11/02/11 0432 11/02/11 0005 11/01/11 2154  GLUCAP 90 95 103*     ASSESSMENT/PLAN: 1. 1 Day Post-Op s/p: evacuation and repair of lymphocele and placement of VAC 2. On IV Vanco  Waverly Ferrari, MD, FACS Beeper: [phone] 11/02/2011

## 2011-11-02 NOTE — Procedures (Signed)
Patient was seen on dialysis and the procedure was supervised.  BFR 400  Via PC BP is  121/61.   Patient appears to be tolerating treatment well  Trixy Loyola A 11/02/2011

## 2011-11-02 NOTE — Progress Notes (Signed)
1 Day Post-Op  Subjective: 1 - Scrotal Abscess - s/p debridement last week. No interval problems. Had ipsilateral lymphocelectomy by vasc surgery yesterday.   Objective: Vital signs in last 24 hours: Temp:  [97 F (36.1 C)-98.9 F (37.2 C)] 98 F (36.7 C) (08/11 0619) Pulse Rate:  [60-92] 83  (08/11 0801) Resp:  [8-19] 17  (08/11 0801) BP: (93-173)/(39-78) 140/74 mmHg (08/11 0801) SpO2:  [93 %-100 %] 98 % (08/11 0801) FiO2 (%):  [2 %] 2 % (08/10 2157) Weight:  [54.5 kg (120 lb 2.4 oz)-69.5 kg (153 lb 3.5 oz)] 55.4 kg (122 lb 2.2 oz) (08/11 0619) Last BM Date: 10/29/11  Intake/Output from previous day: 08/10 0701 - 08/11 0700 In: 340 [P.O.:340] Out: 136 [Blood:20] Intake/Output this shift:    General appearance: alert and cooperative Head: Normocephalic, without obvious abnormality, atraumatic Nose: Nares normal. Septum midline. Mucosa normal. No drainage or sinus tenderness. Cardio: regular rate and rhythm, S1, S2 normal, no murmur, click, rub or gallop Male genitalia: normal, Lt scrotal abscess c/d/i with minimal purulent output. Incision/Wound:  Lab Results:   Basename 11/02/11 0700 11/01/11 1630  WBC 15.6* 12.1*  HGB 8.2* 7.9*  HCT 26.4* 25.5*  PLT 296 244   BMET  Basename 11/02/11 0701 11/01/11 1630  NA 139 138  K 4.3 4.3  CL 101 101  CO2 25 25  GLUCOSE 114* 107*  BUN 41* 43*  CREATININE 6.02* 6.33*  CALCIUM 9.3 9.4   PT/INR No results found for this basename: LABPROT:2,INR:2 in the last 72 hours ABG No results found for this basename: PHART:2,PCO2:2,PO2:2,HCO3:2 in the last 72 hours  Studies/Results: No results found.  Anti-infectives: Anti-infectives     Start     Dose/Rate Route Frequency Ordered Stop   11/28/11 1000   vancomycin (VANCOCIN) 50 mg/mL oral solution 125 mg  Status:  Discontinued        125 mg Oral Every 3 DAYS 10/30/11 0127 11/01/11 2307   11/20/11 1000   vancomycin (VANCOCIN) 50 mg/mL oral solution 125 mg  Status:  Discontinued         125 mg Oral Every other day 10/30/11 0127 11/01/11 2307   11/13/11 1000   vancomycin (VANCOCIN) 50 mg/mL oral solution 125 mg  Status:  Discontinued        125 mg Oral Daily 10/30/11 0127 11/01/11 2307   11/05/11 2200   vancomycin (VANCOCIN) 50 mg/mL oral solution 125 mg  Status:  Discontinued        125 mg Oral 2 times daily 10/30/11 0127 11/01/11 2307   11/03/11 1200   vancomycin (VANCOCIN) 750 mg in sodium chloride 0.9 % 150 mL IVPB        750 mg 150 mL/hr over 60 Minutes Intravenous Every M-W-F (Hemodialysis) 11/01/11 1303     11/01/11 1700   vancomycin (VANCOCIN) 750 mg in sodium chloride 0.9 % 150 mL IVPB        750 mg 150 mL/hr over 60 Minutes Intravenous  Once 11/01/11 1301     10/31/11 1200   vancomycin (VANCOCIN) 750 mg in sodium chloride 0.9 % 150 mL IVPB  Status:  Discontinued        750 mg 150 mL/hr over 60 Minutes Intravenous Every M-W-F (Hemodialysis) 10/30/11 0407 11/01/11 1303   10/30/11 0600   piperacillin-tazobactam (ZOSYN) IVPB 2.25 g        2.25 g 100 mL/hr over 30 Minutes Intravenous Every 8 hours 10/30/11 0407     10/30/11 0415  vancomycin (VANCOCIN) 500 mg in sodium chloride 0.9 % 100 mL IVPB        500 mg 100 mL/hr over 60 Minutes Intravenous  Once 10/30/11 0407 10/30/11 0535   10/30/11 0130   metroNIDAZOLE (FLAGYL) tablet 500 mg  Status:  Discontinued        500 mg Oral 3 times per day 10/30/11 0127 11/01/11 2307   10/30/11 0130   vancomycin (VANCOCIN) 50 mg/mL oral solution 125 mg  Status:  Discontinued        125 mg Oral 4 times daily 10/30/11 0127 11/01/11 2307   10/30/11 0115   vancomycin (VANCOCIN) IVPB 1000 mg/200 mL premix        1,000 mg 200 mL/hr over 60 Minutes Intravenous  Once 10/30/11 0101 10/30/11 0343   10/30/11 0100   Ampicillin-Sulbactam (UNASYN) 3 g in sodium chloride 0.9 % 100 mL IVPB        3 g 100 mL/hr over 60 Minutes Intravenous  Once 10/30/11 0047 10/30/11 0214   10/30/11 0100   ciprofloxacin (CIPRO) IVPB 400 mg         400 mg 200 mL/hr over 60 Minutes Intravenous  Once 10/30/11 0057 10/30/11 0103          Assessment/Plan:  1 - Scrotal Abscess - no need for additional operative debridment at this time. Continue dressing changes.   LOS: 4 days    [name], [name] 11/02/2011

## 2011-11-02 NOTE — Progress Notes (Signed)
Subjective:  No new complaints today.  PC not working well last night so HD was put off until this AM.  also s/p repair of lymphocele and vac in OR yesterday.   There is talk of LTAC placement Objective Vital signs in last 24 hours: Filed Vitals:   11/02/11 0700 11/02/11 0731 11/02/11 0801 11/02/11 0833  BP: 123/64 125/69 140/74 121/61  Pulse: 89 83 83 89  Temp:      TempSrc:      Resp: 14 17 17 18   Height:      Weight:      SpO2: 96% 100% 98%    Weight change: -10.7 kg (-23 lb 9.4 oz)  Intake/Output Summary (Last 24 hours) at 11/02/11 0836 Last data filed at 11/02/11 0500  Gross per 24 hour  Intake    340 ml  Output    136 ml  Net    204 ml   Labs: Basic Metabolic Panel:  Lab 11/02/11 0865 11/01/11 1630 10/31/11 0628  NA 139 138 141  K 4.3 4.3 3.5  CL 101 101 102  CO2 25 25 28   GLUCOSE 114* 107* 113*  BUN 41* 43* 35*  CREATININE 6.02* 6.33* 5.05*  CALCIUM 9.3 9.4 9.0  ALB -- -- --  PHOS 7.1* 7.0* --   Liver Function Tests:  Lab 11/02/11 0701 11/01/11 1630  AST -- --  ALT -- --  ALKPHOS -- --  BILITOT -- --  PROT -- --  ALBUMIN 1.5* 1.6*   No results found for this basename: LIPASE:3,AMYLASE:3 in the last 168 hours No results found for this basename: AMMONIA:3 in the last 168 hours CBC:  Lab 11/02/11 0700 11/01/11 1630 10/31/11 0628 10/30/11 1200 10/29/11 2027  WBC 15.6* 12.1* 8.2 -- --  NEUTROABS -- -- 6.4 -- 7.7  HGB 8.2* 7.9* 8.3* -- --  HCT 26.4* 25.5* 26.6* -- --  MCV 97.4 97.7 98.9 102.0* 102.7*  PLT 296 244 223 -- --   Cardiac Enzymes: No results found for this basename: CKTOTAL:5,CKMB:5,CKMBINDEX:5,TROPONINI:5 in the last 168 hours CBG:  Lab 11/02/11 0432 11/02/11 0005 11/01/11 2154 11/01/11 2017 11/01/11 1502  GLUCAP 90 95 103* 102* 104*    Iron Studies:   Basename 11/01/11 1630  IRON 14*  TIBC 86*  TRANSFERRIN --  FERRITIN 1001*   Studies/Results: No results found. Medications: Infusions:    Scheduled Medications:    .  alteplase  4 mg Intracatheter Once  . darbepoetin (ARANESP) injection - DIALYSIS  100 mcg Intravenous Q Mon-HD  . dextrose  25 mL Intravenous Once  . dextrose  25 mL Intravenous Once  . feeding supplement (NEPRO CARB STEADY)  237 mL Oral BID BM  . gabapentin  200 mg Oral Daily  . heparin subcutaneous  5,000 Units Subcutaneous Q8H  . isosorbide mononitrate  30 mg Oral Daily  . lidocaine-EPINEPHrine  10 mL Intradermal Once  . piperacillin-tazobactam (ZOSYN)  IV  2.25 g Intravenous Q8H  . sodium chloride  3 mL Intravenous Q12H  . vancomycin  750 mg Intravenous Once  . vancomycin  750 mg Intravenous Q M,W,F-HD  . DISCONTD: metroNIDAZOLE  500 mg Oral Q8H  . DISCONTD: vancomycin  125 mg Oral QID  . DISCONTD: vancomycin  125 mg Oral BID  . DISCONTD: vancomycin  125 mg Oral Daily  . DISCONTD: vancomycin  125 mg Oral QODAY  . DISCONTD: vancomycin  125 mg Oral Q3 days  . DISCONTD: vancomycin  750 mg Intravenous Q M,W,F-HD  have reviewed scheduled and prn medications.  Physical Exam: General: chronically ill appearing in air bed.  NAD Heart: RRR Lungs: anteriorly clear Abdomen: soft, nontender Extremities: Left groin wound which appears benign, chronically draining but CT showed deep infection requiring OR debridement.  Dialysis Access: L sided chest catheter.    I Assessment/ Plan: Pt is a 61 y.o. yo male with ESRD who was admitted on 10/29/2011 with  Poorly healing left transmet wound and continued drainage of left groin wound.   Assessment/Plan:  1. Wounds- poorly healing left transmet s/p vac placement on 8/8.  Left groin wound with CT showing extensive and deep involvement, s/p OR debridement and another vac 8/10.  On vanc and zosyn, followed closely by vascular.  2. ESRD- [facility] MWF via PC.  Plan for HD today, then Monday to get back on schedule.  Patient says he is OK running 2 days in a row to keep on schedule.  3. Anemia- due to CKD and blood loss, trending down.   Giving  aranesp, no iron due to high ferritin  4. Secondary hyperparathyroidism- phos 7.1, calc 9.3, PTH pending.  Will start renvela 5. HTN/volume- variable but mostly reasonable.  No BP meds for now. 6. DM-hypoglycemia- per primary team.  7. Dispo- talk of LTAC placement, to investigate more tomorrow.     Annalysse Shoemaker A   11/02/2011,8:36 AM  LOS: 4 days

## 2011-11-03 ENCOUNTER — Inpatient Hospital Stay (HOSPITAL_COMMUNITY): Payer: Medicare Other

## 2011-11-03 DIAGNOSIS — R112 Nausea with vomiting, unspecified: Secondary | ICD-10-CM

## 2011-11-03 LAB — RENAL FUNCTION PANEL
CO2: 34 mEq/L — ABNORMAL HIGH (ref 19–32)
Chloride: 98 mEq/L (ref 96–112)
Creatinine, Ser: 3.72 mg/dL — ABNORMAL HIGH (ref 0.50–1.35)
GFR calc Af Amer: 19 mL/min — ABNORMAL LOW (ref >90)
GFR calc non Af Amer: 16 mL/min — ABNORMAL LOW (ref >90)
Sodium: 143 mEq/L (ref 135–145)

## 2011-11-03 LAB — WOUND CULTURE: Culture: NO GROWTH

## 2011-11-03 LAB — POCT I-STAT 4, (NA,K, GLUC, HGB,HCT)
Hemoglobin: 9.9 g/dL — ABNORMAL LOW (ref 13.0–17.0)
Sodium: 140 mEq/L (ref 135–145)

## 2011-11-03 LAB — GLUCOSE, CAPILLARY
Glucose-Capillary: 77 mg/dL (ref 70–99)
Glucose-Capillary: 87 mg/dL (ref 70–99)
Glucose-Capillary: 80 mg/dL (ref 70–99)
Glucose-Capillary: 72 mg/dL (ref 70–99)
Glucose-Capillary: 84 mg/dL (ref 70–99)

## 2011-11-03 LAB — CBC
MCV: 97.8 fL (ref 78.0–100.0)
Platelets: 279 10*3/uL (ref 150–400)
RBC: 2.68 MIL/uL — ABNORMAL LOW (ref 4.22–5.81)
WBC: 14.6 10*3/uL — ABNORMAL HIGH (ref 4.0–10.5)

## 2011-11-03 LAB — STOOL CULTURE

## 2011-11-03 LAB — PARATHYROID HORMONE, INTACT (NO CA): PTH: 278.6 pg/mL — ABNORMAL HIGH (ref 14.0–72.0)

## 2011-11-03 MED ORDER — VANCOMYCIN HCL 500 MG IV SOLR: 500.0000 mg | INTRAVENOUS | Status: DC

## 2011-11-03 MED ORDER — HYDROMORPHONE HCL PF 1 MG/ML IJ SOLN: 0.2500 mg | INTRAMUSCULAR | Status: DC | PRN

## 2011-11-03 MED ORDER — OXYCODONE HCL 5 MG/5ML PO SOLN: 5.0000 mg | Freq: Once | ORAL | Status: AC | PRN

## 2011-11-03 MED ORDER — METOCLOPRAMIDE HCL 5 MG/ML IJ SOLN: 10.0000 mg | Freq: Once | INTRAMUSCULAR | Status: AC | PRN

## 2011-11-03 MED ORDER — OXYCODONE HCL 5 MG PO TABS: 5.0000 mg | ORAL_TABLET | Freq: Once | ORAL | Status: AC | PRN

## 2011-11-03 MED ORDER — DARBEPOETIN ALFA-POLYSORBATE 100 MCG/0.5ML IJ SOLN
INTRAMUSCULAR | Status: AC
  Administered 2011-11-03: 100 ug via INTRAVENOUS
  Filled 2011-11-03: qty 0.5

## 2011-11-03 MED ORDER — MORPHINE SULFATE 4 MG/ML IJ SOLN
INTRAMUSCULAR | Status: AC
  Administered 2011-11-03: 4 mg via INTRAVENOUS
  Filled 2011-11-03: qty 1

## 2011-11-03 NOTE — Progress Notes (Signed)
[facility] [place]  Postoperative Visit - Amputation  Date of Surgery: 10/29/2011 - Revision of left transmetatarsal amputation #2.) Placement of wound VAC. 11/01/2011 GROIN DEBRIDEMENT Left Surgeon: Surgeon(s): Chuck Hint, MD POD: 2 Days Post-Op  Subjective [name] is a 61 y.o. male who is S/P Left revision TMA and left GROIN DEBRIDEMENT.  Pt.C/O some pain in the foot with vac. The patient notes pain is well controlled.   Significant Diagnostic Studies: CBC Lab Results  Component Value Date   WBC 15.6* 11/02/2011   HGB 8.2* 11/02/2011   HCT 26.4* 11/02/2011   MCV 97.4 11/02/2011   PLT 296 11/02/2011    BMET    Component Value Date/Time   NA 139 11/02/2011 0701   K 4.3 11/02/2011 0701   CL 101 11/02/2011 0701   CO2 25 11/02/2011 0701   GLUCOSE 114* 11/02/2011 0701   BUN 41* 11/02/2011 0701   CREATININE 6.02* 11/02/2011 0701   CALCIUM 9.3 11/02/2011 0701   GFRNONAA 9* 11/02/2011 0701   GFRAA 10* 11/02/2011 0701    COAG Lab Results  Component Value Date   INR 1.30 09/12/2011   INR 1.38 08/27/2011   INR 1.54* 08/19/2011   No results found for this basename: PTT     Intake/Output Summary (Last 24 hours) at 11/03/11 0918 Last data filed at 11/03/11 0615  Gross per 24 hour  Intake    290 ml  Output   4275 ml  Net  -3985 ml   No data found.    Physical Examination  BP Readings from Last 3 Encounters:  11/03/11 176/86  11/03/11 176/86  11/03/11 176/86   Temp Readings from Last 3 Encounters:  11/03/11 98 F (36.7 C) Oral  11/03/11 98 F (36.7 C) Oral  11/03/11 98 F (36.7 C) Oral   SpO2 Readings from Last 3 Encounters:  11/03/11 97%  11/03/11 97%  11/03/11 97%   Pulse Readings from Last 3 Encounters:  11/03/11 112  11/03/11 112  11/03/11 112    Pt is A&Ox3  WDWN male with no complaints  Left amputation wound is healing well. With wound vac in place Left groin wound soft with wound vac in place Stump is warm and  well perfused, with min drainage; without erythema   Assessment/plan:  [name] is a 61 y.o. male who is s/p Left revision TMA and left  GROIN DEBRIDEMENT  The patient's stump is viable.  Continue wound vac with dressing changes T TH S  Will look at left TMA and groin wounds tomorrow with wound vac change  Decrease vac pressure to help with discomfort in foot  Geral Coker J 9:18 AM 11/03/2011 [phone]

## 2011-11-03 NOTE — Progress Notes (Signed)
2 Days Post-Op Subjective: Patient reports no scrotal pain.  Objective: Vital signs in last 24 hours: Temp:  [97.4 F (36.3 C)-99.1 F (37.3 C)] 98 F (36.7 C) (08/12 0614) Pulse Rate:  [78-112] 112  (08/12 0614) Resp:  [14-21] 18  (08/12 0614) BP: (118-191)/(56-91) 176/86 mmHg (08/12 0630) SpO2:  [95 %-100 %] 97 % (08/12 0614) Weight:  [51.3 kg (113 lb 1.5 oz)] 51.3 kg (113 lb 1.5 oz) (08/11 1132)  Intake/Output from previous day: 08/11 0701 - 08/12 0700 In: 290 [P.O.:240; IV Piggyback:50] Out: 4275 [Emesis/NG output:1525] Intake/Output this shift:    Physical Exam:  Scrotum: no erythema, crepitus, or purulent d/c.   Lab Results:  Basename 11/02/11 0700 11/01/11 1630 11/01/11 1051  HGB 8.2* 7.9* 9.9*  HCT 26.4* 25.5* 29.0*   BMET  Basename 11/02/11 0701 11/01/11 1630  NA 139 138  K 4.3 4.3  CL 101 101  CO2 25 25  GLUCOSE 114* 107*  BUN 41* 43*  CREATININE 6.02* 6.33*  CALCIUM 9.3 9.4   No results found for this basename: LABPT:3,INR:3 in the last 72 hours No results found for this basename: LABURIN:1 in the last 72 hours Results for orders placed during the hospital encounter of 10/29/11  STOOL CULTURE     Status: Normal (Preliminary result)   Collection Time   10/29/11  8:12 PM      Component Value Range Status Comment   Specimen Description STOOL   Final    Special Requests NONE   Final    Culture NO SUSPICIOUS COLONIES, CONTINUING TO HOLD   Final    Report Status PENDING   Incomplete   SURGICAL PCR SCREEN     Status: Normal   Collection Time   10/30/11  6:48 AM      Component Value Range Status Comment   MRSA, PCR NEGATIVE  NEGATIVE Final    Staphylococcus aureus NEGATIVE  NEGATIVE Final   CULTURE, ROUTINE-ABSCESS     Status: Normal   Collection Time   10/30/11  6:27 PM      Component Value Range Status Comment   Specimen Description ABSCESS SCROTUM   Final    Special Requests NONE   Final    Gram Stain     Final    Value: NO WBC SEEN     FEW  SQUAMOUS EPITHELIAL CELLS PRESENT     FEW GRAM POSITIVE COCCI IN PAIRS   Culture MODERATE ENTEROCOCCUS SPECIES   Final    Report Status 11/02/2011 FINAL   Final    Organism ID, Bacteria ENTEROCOCCUS SPECIES   Final   WOUND CULTURE     Status: Normal   Collection Time   11/01/11 12:28 PM      Component Value Range Status Comment   Specimen Description WOUND LEFT GROIN   Final    Special Requests SWABED FLUID FROM GROIN, PT ON VANCO,ZOSYN,FLAGYL   Final    Gram Stain     Final    Value: ABUNDANT WBC PRESENT, PREDOMINANTLY MONONUCLEAR     NO ORGANISMS SEEN   Culture NO GROWTH 2 DAYS   Final    Report Status 11/03/2011 FINAL   Final   ANAEROBIC CULTURE     Status: Normal (Preliminary result)   Collection Time   11/01/11 12:28 PM      Component Value Range Status Comment   Specimen Description WOUND LEFT GROIN   Final    Special Requests SWABED FLUID FROM GROIN, PT ON VANCO,ZOSYN,FLAGYL  Final    Gram Stain     Final    Value: ABUNDANT WBC PRESENT, PREDOMINANTLY MONONUCLEAR     NO ORGANISMS SEEN   Culture     Final    Value: NO ANAEROBES ISOLATED; CULTURE IN PROGRESS FOR 5 DAYS   Report Status PENDING   Incomplete     Studies/Results: Dg Abd 1 View  11/03/2011  *RADIOLOGY REPORT*  Clinical Data: Abdominal pain.  Ileus.  ABDOMEN - 1 VIEW  Comparison: 10/31/2011.  Findings: Stable appearance of calcifications in the left upper quadrant.  The bowel gas pattern appears within normal limits, nonobstructive.  Extensive atherosclerosis.  Stool is present within the rectum. Oral contrast is present within small bowel, presumably administered in conjunction with prior CT.  IMPRESSION: Normal bowel gas pattern. Persistence of small bowel oral contrast suggests ileus.  Original Report Authenticated By: Andreas Newport, M.D.    Assessment/Plan: Scrotal abscess: Drained with progressive healing noted. Cult. grew Enterococcus sensitive to Vanc. and Amp.  1. Wound care team to begin  therapy/treatment today. 2. Can have packing removed and wound irrigated with new packing. 3. Will be able to switch to oral Amp. at D/C.   LOS: 5 days   [name] C 11/03/2011, 7:46 AM

## 2011-11-03 NOTE — Progress Notes (Signed)
Patient had bouts of N/V during night shift.  Vomitus totaling more than 700cc.  It was dark brown, murky, and thin.  MD notified.  Abdominal xray obtained +illeus so patient is now NPO.  Also, Wound VAC on left foot keeps signaling a slow air leak.  After reinforcement, the VAC still continues to beep.  MD made aware.  WOC is supposed to be reassessing patient tomo and this should be addressed at this time.  Will continue to monitor patient.

## 2011-11-03 NOTE — Consult Note (Addendum)
Wound care follow-up requested for several wounds.   Sacrum with chronicstage 4 wound previously assessed and dressing change orders given.  Refer to previous progress notes for assessment and plan of care. Pt has air overlay mattress to decrease pressure.  Pt followed by urology for I&D of scrotal wound last Friday.  Wound is full thickness 1X.5X1cm, wound bed beefy red, no odor or drainage.  Moist gauze packing has been ordered by urology team.  Packing strip applied using swab to fill.  Pt tolerated with mod discomfort.  Agree with present plan of care. Bedside nurses can change dressing as ordered.  Pt folloed by VVS team for left groin and received I&D last week.  Full thickness wound 100% beefy red.  Vac dressing was alarming, had 250cc old clotted blood in cannister and requested to assist with troubleshooting.  One piece black sponge applied to 100 mm cont suction and cannister changed.  Pt tolerated with minimal discomfort.  Bedside nurse can change dressing Q M/Thurs/Sat.  Pt followed by VVS team for left foot wound.  Full thickness wound post-surgical site with vac dressing change.  Removed 2 pieces black foam.  Site very painful after medication given, mod amt bleeding when sponges removed.  Applied 2 pieces black foam to 100cm cont suction.  8X1X1.5cm.  80% red, 10% yellow, 10% dark brown.  Bedside nurse can change dressing Q M/Thurs/Sat.   Cammie Mcgee, RN, MSN, [employer]  [phone]

## 2011-11-03 NOTE — Progress Notes (Signed)
Subjective:  No new complaints, L foot still sore  Objective Vital signs in last 24 hours: Filed Vitals:   11/03/11 1000 11/03/11 1030 11/03/11 1100 11/03/11 1130  BP: 140/53 141/77 162/82 158/82  Pulse: 105 99 92 96  Temp:      TempSrc:      Resp: 15 13 14 15   Height:      Weight:      SpO2:       Weight change: -3.2 kg (-7 lb 0.9 oz)  Intake/Output Summary (Last 24 hours) at 11/03/11 1157 Last data filed at 11/03/11 0615  Gross per 24 hour  Intake    290 ml  Output   1525 ml  Net  -1235 ml   Labs: Basic Metabolic Panel:  Lab 11/03/11 1478 11/02/11 0701 11/01/11 1630  NA 143 139 138  K 3.5 4.3 4.3  CL 98 101 101  CO2 34* 25 25  GLUCOSE 91 114* 107*  BUN 18 41* 43*  CREATININE 3.72* 6.02* 6.33*  CALCIUM 9.3 9.3 9.4  ALB -- -- --  PHOS 5.6* 7.1* 7.0*   Liver Function Tests:  Lab 11/03/11 0941 11/02/11 0701 11/01/11 1630  AST -- -- --  ALT -- -- --  ALKPHOS -- -- --  BILITOT -- -- --  PROT -- -- --  ALBUMIN 1.6* 1.5* 1.6*   No results found for this basename: LIPASE:3,AMYLASE:3 in the last 168 hours No results found for this basename: AMMONIA:3 in the last 168 hours CBC:  Lab 11/03/11 0940 11/02/11 0700 11/01/11 1630 10/31/11 0628 10/30/11 1200 10/29/11 2027  WBC 14.6* 15.6* 12.1* -- -- --  NEUTROABS -- -- -- 6.4 -- 7.7  HGB 8.0* 8.2* 7.9* -- -- --  HCT 26.2* 26.4* 25.5* -- -- --  MCV 97.8 97.4 97.7 98.9 102.0* --  PLT 279 296 244 -- -- --   Cardiac Enzymes: No results found for this basename: CKTOTAL:5,CKMB:5,CKMBINDEX:5,TROPONINI:5 in the last 168 hours CBG:  Lab 11/03/11 1023 11/03/11 0756 11/03/11 0608 11/03/11 0427 11/03/11 0127  GLUCAP 80 104* 93 87 90    Iron Studies:   Basename 11/01/11 1630  IRON 14*  TIBC 86*  TRANSFERRIN --  FERRITIN 1001*   Studies/Results: Dg Abd 1 View  11/03/2011  *RADIOLOGY REPORT*  Clinical Data: Abdominal pain.  Ileus.  ABDOMEN - 1 VIEW  Comparison: 10/31/2011.  Findings: Stable appearance of  calcifications in the left upper quadrant.  The bowel gas pattern appears within normal limits, nonobstructive.  Extensive atherosclerosis.  Stool is present within the rectum. Oral contrast is present within small bowel, presumably administered in conjunction with prior CT.  IMPRESSION: Normal bowel gas pattern. Persistence of small bowel oral contrast suggests ileus.  Original Report Authenticated By: Andreas Newport, M.D.   Medications: Infusions:    Scheduled Medications:    . alteplase  4 mg Intracatheter Once  . aspirin  81 mg Oral Daily  . darbepoetin (ARANESP) injection - DIALYSIS  100 mcg Intravenous Q Mon-HD  . feeding supplement (NEPRO CARB STEADY)  237 mL Oral BID BM  . gabapentin  200 mg Oral Daily  . heparin subcutaneous  5,000 Units Subcutaneous Q8H  . isosorbide mononitrate  30 mg Oral Daily  . lidocaine-EPINEPHrine  10 mL Intradermal Once  . multivitamin  1 tablet Oral QHS  . pantoprazole  40 mg Oral Q1200  . piperacillin-tazobactam (ZOSYN)  IV  2.25 g Intravenous Q8H  . sevelamer  1,600 mg Oral TID WC  . simvastatin  5 mg Oral QHS  . sodium chloride  3 mL Intravenous Q12H  . vancomycin  750 mg Intravenous Q M,W,F-HD  . vancomycin  750 mg Intravenous Once    have reviewed scheduled and prn medications.  Physical Exam: General: chronically ill appearing in air bed.  NAD Heart: RRR Lungs: anteriorly clear Abdomen: soft, nontender Extremities: Left groin wound with VAC in place, L TMA with VAC in place. No LE edema  Dialysis Access: L sided chest catheter.    Summary Pt is a 61 y.o. yo male with ESRD who was admitted on 10/29/2011 with  Poorly healing left transmet wound and continued drainage of left groin wound.    Assessment/Plan:  1. Wounds- poorly healing left transmet s/p vac placement on 8/8; also, L groin lymphocele repair on 8/10 with VAC.  On vanc and zosyn, followed closely by vascular.  2. ESRD- [facility] MWF via PC.  Plan for HD today, then  Monday to get back on schedule.  Patient says he is OK running 2 days in a row to keep on schedule.  3. Anemia- due to CKD and blood loss, trending down.   Giving aranesp, no iron due to high ferritin  4. Secondary hyperparathyroidism- phos 7.1, calc 9.3, PTH pending.  Will start renvela 5. HTN/volume- variable but mostly reasonable.  No BP meds for now. 6. DM-hypoglycemia- per primary team.  7. Dispo- talk of LTAC placement, to investigate more tomorrow.    Earl Moselle  MD [facility] [phone] pgr    [phone] cell 11/03/2011, 12:06 PM

## 2011-11-03 NOTE — Progress Notes (Signed)
Patient [name], 61 year old [place] American male struggles with health challenges.  He enjoys the emotional of his wife and children.  Patient displays an upbeat attitude; and was very appreciative of Chaplain's provision of pastoral presence and conversation.  I will follow up as needed.

## 2011-11-03 NOTE — Progress Notes (Signed)
[facility] PROGRESS NOTE  ZYHIR CAPPELLA JYN:[id number] DOB: December 05, 1950 DOA: 10/29/2011 PCP: Toma Deiters, MD  Assessment/Plan: Principal Problem:  *Scrotal abscess Active Problems:  CKD (chronic kidney disease) stage V requiring chronic dialysis  Hypertension  Peripheral vascular disease, unspecified  C. difficile colitis- recalcitrant  Non-healing surgical wound  Diarrhea  1. Nausea and vomiting: Patient had several episodes of reported nausea and vomiting last night. He had of 1 view abdomen which showed persistence of small bowel oral contrast which suggests an ileus. However clinically the patient has no appreciable distention and no findings consistent clinically with an ileus. I will start her on clear liquids and advance as tolerated to a renal diet 2. Hypoglycemia: Patient's blood sugars are more stable today with no blood sugars dipping below 70. Insulin C-peptide levels noted to be within normal limits 3. Scrotal Abscess: S/P I&D. Cultures show moderate enterococcus sensitive to vancomycin and ampicillin. Will DC vancomycin continue ampicillin per 4. Fluid collection in Groin: Patient underwent exploration of left groin and repair of lymphocele. He also had placement of wound VAC . I will defer to  vascular surgery for further management of lymphocele. 5.  ESRD: HD M/W/F continue per nephrology     6. PVD/ nonhealing surgical wound: Pt S/P debridement and placemnt of wound vac. 7. Diarrhea: Patient has had no further diarrhea since admission. Oral vancomycin and Flagyl discontinued yesterday 8. Disposition: This patient will likely need to go to LTAC prior to his discharge home. Code Status: Full Code Family Communication: N/A Disposition Plan: Not yet determined  MATTHEWS,MICHELLE A.  [facility] Pager [phone]. If 8PM-8AM, please contact night-coverage at [email or web address], password [facility] 11/03/2011, 7:37 PM  LOS: 5 days   Brief narrative: Mr. Bourdon is a  61 year old male with end stage renal disease and peripheral vascular disease presented to the emergency room at [facility] with a four-day history of left hemiscrotal pain. He was seen and evaluated there. He states that he has had a previous scrotal abscess that he described as a "boil" that he popped himself in the past and which healed completly. He complains of discomfort with any movement of the scrotum, however he has not had any fevers.   Consultants:  Dr. Myra Gianotti- [facility]  Urology- [place]  Procedures:  S/P shunt transmetatarsal amputation and placement of wound VAC  Antibiotics:  Flagyl 8/8 >> 8/10  Oral Vancomycin 8/8 >> 8/10  IV vancomycin 8/8 >>  Zosyn 8/8 >>  HPI/Subjective: Patient with no complaints.  Objective: Filed Vitals:   11/03/11 1350 11/03/11 1357 11/03/11 1400 11/03/11 1500  BP: 128/68 142/72  96/58  Pulse: 95 98  90  Temp:  99.1 F (37.3 C)  99.4 F (37.4 C)  TempSrc:  Oral  Oral  Resp: 16 18  14   Height:      Weight:   46 kg (101 lb 6.6 oz)   SpO2:  95%  99%   Weight change: -3.2 kg (-7 lb 0.9 oz)  Intake/Output Summary (Last 24 hours) at 11/03/11 1937 Last data filed at 11/03/11 1845  Gross per 24 hour  Intake    180 ml  Output   3801 ml  Net  -3621 ml    General: Alert, awake, oriented x3, in no acute distress.  Neck: Trachea midline,  no masses, no thyromegal,y no JVD, no carotid bruit OROPHARYNX:  Moist, No exudate/ erythema/lesions.  Heart: Regular rate and rhythm, without murmurs, rubs, gallops, PMI non-displaced, no heaves or thrills on palpation.  Lungs: Clear to auscultation, no wheezing or rhonchi noted.   Abdomen: Soft. Patient has wound VAC in place in the left groin area. Genitalia: Patient has minimal drainage from his scrotal abscess today.  Data Reviewed: Basic Metabolic Panel:  Lab 11/03/11 8295 11/02/11 0701 11/01/11 1630 11/01/11 1051 10/31/11 0628 10/30/11 1200  NA 143 139 138 140 141 --  K 3.5 4.3 4.3 4.1 3.5  --  CL 98 101 101 -- 102 103  CO2 34* 25 25 -- 28 26  GLUCOSE 91 114* 107* 63* 113* --  BUN 18 41* 43* -- 35* 26*  CREATININE 3.72* 6.02* 6.33* -- 5.05* 4.20*  CALCIUM 9.3 9.3 9.4 -- 9.0 9.0  MG -- -- -- -- -- --  PHOS 5.6* 7.1* 7.0* -- -- --   Liver Function Tests:  Lab 11/03/11 0941 11/02/11 0701 11/01/11 1630  AST -- -- --  ALT -- -- --  ALKPHOS -- -- --  BILITOT -- -- --  PROT -- -- --  ALBUMIN 1.6* 1.5* 1.6*   No results found for this basename: LIPASE:5,AMYLASE:5 in the last 168 hours No results found for this basename: AMMONIA:5 in the last 168 hours CBC:  Lab 11/03/11 0940 11/02/11 0700 11/01/11 1630 11/01/11 1051 10/31/11 0628 10/30/11 1200 10/29/11 2027  WBC 14.6* 15.6* 12.1* -- 8.2 9.0 --  NEUTROABS -- -- -- -- 6.4 -- 7.7  HGB 8.0* 8.2* 7.9* 9.9* 8.3* -- --  HCT 26.2* 26.4* 25.5* 29.0* 26.6* -- --  MCV 97.8 97.4 97.7 -- 98.9 102.0* --  PLT 279 296 244 -- 223 193 --   Cardiac Enzymes: No results found for this basename: CKTOTAL:5,CKMB:5,CKMBINDEX:5,TROPONINI:5 in the last 168 hours BNP (last 3 results)  Basename 08/28/11 0540 08/27/11 0930 08/26/11 2305  PROBNP 21860.0* 19652.0* 25900.0*   CBG:  Lab 11/03/11 1851 11/03/11 1542 11/03/11 1401 11/03/11 1240 11/03/11 1023  GLUCAP 126* 86 84 72 80    Recent Results (from the past 240 hour(s))  STOOL CULTURE     Status: Normal   Collection Time   10/29/11  8:12 PM      Component Value Range Status Comment   Specimen Description STOOL   Final    Special Requests NONE   Final    Culture     Final    Value: NO SALMONELLA, SHIGELLA, CAMPYLOBACTER, YERSINIA, OR E.COLI 0157:H7 ISOLATED   Report Status 11/03/2011 FINAL   Final   SURGICAL PCR SCREEN     Status: Normal   Collection Time   10/30/11  6:48 AM      Component Value Range Status Comment   MRSA, PCR NEGATIVE  NEGATIVE Final    Staphylococcus aureus NEGATIVE  NEGATIVE Final   CULTURE, ROUTINE-ABSCESS     Status: Normal   Collection Time   10/30/11  6:27 PM       Component Value Range Status Comment   Specimen Description ABSCESS SCROTUM   Final    Special Requests NONE   Final    Gram Stain     Final    Value: NO WBC SEEN     FEW SQUAMOUS EPITHELIAL CELLS PRESENT     FEW GRAM POSITIVE COCCI IN PAIRS   Culture MODERATE ENTEROCOCCUS SPECIES   Final    Report Status 11/02/2011 FINAL   Final    Organism ID, Bacteria ENTEROCOCCUS SPECIES   Final   WOUND CULTURE     Status: Normal   Collection Time   11/01/11 12:28 PM  Component Value Range Status Comment   Specimen Description WOUND LEFT GROIN   Final    Special Requests SWABED FLUID FROM GROIN, PT ON VANCO,ZOSYN,FLAGYL   Final    Gram Stain     Final    Value: ABUNDANT WBC PRESENT, PREDOMINANTLY MONONUCLEAR     NO ORGANISMS SEEN   Culture NO GROWTH 2 DAYS   Final    Report Status 11/03/2011 FINAL   Final   ANAEROBIC CULTURE     Status: Normal (Preliminary result)   Collection Time   11/01/11 12:28 PM      Component Value Range Status Comment   Specimen Description WOUND LEFT GROIN   Final    Special Requests SWABED FLUID FROM GROIN, PT ON VANCO,ZOSYN,FLAGYL   Final    Gram Stain     Final    Value: ABUNDANT WBC PRESENT, PREDOMINANTLY MONONUCLEAR     NO ORGANISMS SEEN   Culture     Final    Value: NO ANAEROBES ISOLATED; CULTURE IN PROGRESS FOR 5 DAYS   Report Status PENDING   Incomplete      Studies: Portable Chest 1 View  10/12/2011  *RADIOLOGY REPORT*  Clinical Data: SOB, evaluate for pneumonia  PORTABLE CHEST - 1 VIEW  Comparison: 10/11/2011  Findings: Cardiomegaly with mild interstitial edema and moderate right pleural effusion.  Suspected small left pleural effusion.  Associated right lower lobe opacity, likely atelectasis, underlying pneumonia not excluded.  No pneumothorax.  Stable left IJ venous catheter.  IMPRESSION: Cardiomegaly with mild interstitial edema and moderate right pleural effusion.  Suspected small left pleural effusion.  Associate right lower lobe opacity,  likely atelectasis, underlying pneumonia not excluded.  Original Report Authenticated By: Charline Bills, M.D.   Dg Abd Acute W/chest  10/11/2011  *RADIOLOGY REPORT*  Clinical Data: Abdominal pain, nausea and vomiting.  ACUTE ABDOMEN SERIES (ABDOMEN 2 VIEW & CHEST 1 VIEW)  Comparison: Abdominal radiographs 08/28/2011.  Chest x-ray 09/10/2011.  Findings: Left internal jugular PermCath with the tips terminating in the right atrium and at the superior cavoatrial junction is unchanged.  Lung volumes are low.  Linear opacities in the left mid and lower lung are most consistent with atelectasis.  Patchy airspace consolidation and volume loss in the right middle and lower lobes.  Trace left and small right-sided pleural effusions. No pneumoperitoneum.  Supine and upright views of the abdomen demonstrate gas and stool scattered throughout the colon extending to the level of the distal rectum.  No pathologic distension of small bowel is noted.  Midline surgical staples are again noted.  Numerous vascular calcifications.  In addition, there are numerous egg-shell type calcifications in the left upper quadrant of the abdomen which appear to correspond with numerous calcified cystic lesions based on comparison with prior CT scans.  IMPRESSION: 1.  Nonobstructive bowel gas pattern. 2.  No pneumoperitoneum. 3.  Support apparatus and postoperative changes, as above. 4.  Air space consolidation and atelectasis in the right middle and lower lobes, concerning for pneumonia and/or sequelae of aspiration. 5.  Small right and trace left-sided pleural effusions. 6.  Atherosclerosis.  Original Report Authenticated By: Florencia Reasons, M.D.   Dg Foot Complete Left  10/17/2011  *RADIOLOGY REPORT*  Clinical Data: Wound infection  LEFT FOOT - COMPLETE 3+ VIEW  Comparison: None.  Findings: The patient is status post amputation of the foot beyond the proximal aspect of the five metatarsals.  No acute fracture. No dislocation.  No  periosteal reaction.  No destructive  bone lesion.  Vascular calcifications are prominent.  An open wound at the amputation site is noted.  IMPRESSION: Status post forefoot amputation.  Open wound is present at the amputation site.  No acute bony pathology.  Original Report Authenticated By: Donavan Burnet, M.D.    Scheduled Meds:    . alteplase  4 mg Intracatheter Once  . aspirin  81 mg Oral Daily  . darbepoetin (ARANESP) injection - DIALYSIS  100 mcg Intravenous Q Mon-HD  . feeding supplement (NEPRO CARB STEADY)  237 mL Oral BID BM  . gabapentin  200 mg Oral Daily  . heparin subcutaneous  5,000 Units Subcutaneous Q8H  . isosorbide mononitrate  30 mg Oral Daily  . lidocaine-EPINEPHrine  10 mL Intradermal Once  . multivitamin  1 tablet Oral QHS  . pantoprazole  40 mg Oral Q1200  . piperacillin-tazobactam (ZOSYN)  IV  2.25 g Intravenous Q8H  . sevelamer  1,600 mg Oral TID WC  . simvastatin  5 mg Oral QHS  . sodium chloride  3 mL Intravenous Q12H  . vancomycin  500 mg Intravenous Q M,W,F-HD  . DISCONTD: vancomycin  750 mg Intravenous Q M,W,F-HD   Continuous Infusions:   Principal Problem:  *Scrotal abscess Active Problems:  CVA  CKD (chronic kidney disease) stage V requiring chronic dialysis  Hypertension  Peripheral vascular disease, unspecified  C. difficile colitis- recalcitrant  ESRD on hemodialysis  Non-healing surgical wound  Diarrhea  Hypoglycemia  Lymphocele  Infection due to enterococcus

## 2011-11-03 NOTE — Progress Notes (Signed)
ANTIBIOTIC CONSULT NOTE - FOLLOW UP  Pharmacy Consult for Vancomycin/Zosyn Indication: scrotal abscess  No Known Allergies  Patient Measurements: Height: 5\' 6"  (167.6 cm) Weight: 107 lb 2.3 oz (48.6 kg) IBW/kg (Calculated) : 63.8   Vital Signs: Temp: 99 F (37.2 C) (08/12 0929) Temp src: Oral (08/12 0929) BP: 129/73 mmHg (08/12 1300) Pulse Rate: 98  (08/12 1300) Intake/Output from previous day: 08/11 0701 - 08/12 0700 In: 290 [P.O.:240; IV Piggyback:50] Out: 4275 [Emesis/NG output:1525] Intake/Output from this shift:    Labs:  Basename 11/03/11 0941 11/03/11 0940 11/02/11 0701 11/02/11 0700 11/01/11 1630  WBC -- 14.6* -- 15.6* 12.1*  HGB -- 8.0* -- 8.2* 7.9*  PLT -- 279 -- 296 244  LABCREA -- -- -- -- --  CREATININE 3.72* -- 6.02* -- 6.33*   Estimated Creatinine Clearance: 14.2 ml/min (by C-G formula based on Cr of 3.72). No results found for this basename: VANCOTROUGH:2,VANCOPEAK:2,VANCORANDOM:2,GENTTROUGH:2,GENTPEAK:2,GENTRANDOM:2,TOBRATROUGH:2,TOBRAPEAK:2,TOBRARND:2,AMIKACINPEAK:2,AMIKACINTROU:2,AMIKACIN:2, in the last 72 hours   Microbiology: Recent Results (from the past 720 hour(s))  MRSA PCR SCREENING     Status: Abnormal   Collection Time   10/11/11  4:35 PM      Component Value Range Status Comment   MRSA by PCR POSITIVE (*) NEGATIVE Final   STOOL CULTURE     Status: Normal   Collection Time   10/29/11  8:12 PM      Component Value Range Status Comment   Specimen Description STOOL   Final    Special Requests NONE   Final    Culture     Final    Value: NO SALMONELLA, SHIGELLA, CAMPYLOBACTER, YERSINIA, OR E.COLI 0157:H7 ISOLATED   Report Status 11/03/2011 FINAL   Final   SURGICAL PCR SCREEN     Status: Normal   Collection Time   10/30/11  6:48 AM      Component Value Range Status Comment   MRSA, PCR NEGATIVE  NEGATIVE Final    Staphylococcus aureus NEGATIVE  NEGATIVE Final   CULTURE, ROUTINE-ABSCESS     Status: Normal   Collection Time   10/30/11  6:27 PM        Component Value Range Status Comment   Specimen Description ABSCESS SCROTUM   Final    Special Requests NONE   Final    Gram Stain     Final    Value: NO WBC SEEN     FEW SQUAMOUS EPITHELIAL CELLS PRESENT     FEW GRAM POSITIVE COCCI IN PAIRS   Culture MODERATE ENTEROCOCCUS SPECIES   Final    Report Status 11/02/2011 FINAL   Final    Organism ID, Bacteria ENTEROCOCCUS SPECIES   Final   WOUND CULTURE     Status: Normal   Collection Time   11/01/11 12:28 PM      Component Value Range Status Comment   Specimen Description WOUND LEFT GROIN   Final    Special Requests SWABED FLUID FROM GROIN, PT ON VANCO,ZOSYN,FLAGYL   Final    Gram Stain     Final    Value: ABUNDANT WBC PRESENT, PREDOMINANTLY MONONUCLEAR     NO ORGANISMS SEEN   Culture NO GROWTH 2 DAYS   Final    Report Status 11/03/2011 FINAL   Final   ANAEROBIC CULTURE     Status: Normal (Preliminary result)   Collection Time   11/01/11 12:28 PM      Component Value Range Status Comment   Specimen Description WOUND LEFT GROIN   Final    Special  Requests SWABED FLUID FROM GROIN, PT ON VANCO,ZOSYN,FLAGYL   Final    Gram Stain     Final    Value: ABUNDANT WBC PRESENT, PREDOMINANTLY MONONUCLEAR     NO ORGANISMS SEEN   Culture     Final    Value: NO ANAEROBES ISOLATED; CULTURE IN PROGRESS FOR 5 DAYS   Report Status PENDING   Incomplete     Anti-infectives     Start     Dose/Rate Route Frequency Ordered Stop   11/28/11 1000   vancomycin (VANCOCIN) 50 mg/mL oral solution 125 mg  Status:  Discontinued        125 mg Oral Every 3 DAYS 10/30/11 0127 11/01/11 2307   11/20/11 1000   vancomycin (VANCOCIN) 50 mg/mL oral solution 125 mg  Status:  Discontinued        125 mg Oral Every other day 10/30/11 0127 11/01/11 2307   11/13/11 1000   vancomycin (VANCOCIN) 50 mg/mL oral solution 125 mg  Status:  Discontinued        125 mg Oral Daily 10/30/11 0127 11/01/11 2307   11/05/11 2200   vancomycin (VANCOCIN) 50 mg/mL oral solution 125 mg   Status:  Discontinued        125 mg Oral 2 times daily 10/30/11 0127 11/01/11 2307   11/03/11 1200   vancomycin (VANCOCIN) 750 mg in sodium chloride 0.9 % 150 mL IVPB        750 mg 150 mL/hr over 60 Minutes Intravenous Every M-W-F (Hemodialysis) 11/01/11 1303     11/02/11 1000   vancomycin (VANCOCIN) 750 mg in sodium chloride 0.9 % 150 mL IVPB        750 mg 150 mL/hr over 60 Minutes Intravenous  Once 11/02/11 0909 11/02/11 1205   11/01/11 1700   vancomycin (VANCOCIN) 750 mg in sodium chloride 0.9 % 150 mL IVPB  Status:  Discontinued        750 mg 150 mL/hr over 60 Minutes Intravenous  Once 11/01/11 1301 11/02/11 0908   10/31/11 1200   vancomycin (VANCOCIN) 750 mg in sodium chloride 0.9 % 150 mL IVPB  Status:  Discontinued        750 mg 150 mL/hr over 60 Minutes Intravenous Every M-W-F (Hemodialysis) 10/30/11 0407 11/01/11 1303   10/30/11 0600  piperacillin-tazobactam (ZOSYN) IVPB 2.25 g       2.25 g 100 mL/hr over 30 Minutes Intravenous Every 8 hours 10/30/11 0407     10/30/11 0415   vancomycin (VANCOCIN) 500 mg in sodium chloride 0.9 % 100 mL IVPB        500 mg 100 mL/hr over 60 Minutes Intravenous  Once 10/30/11 0407 10/30/11 0535   10/30/11 0130   metroNIDAZOLE (FLAGYL) tablet 500 mg  Status:  Discontinued        500 mg Oral 3 times per day 10/30/11 0127 11/01/11 2307   10/30/11 0130   vancomycin (VANCOCIN) 50 mg/mL oral solution 125 mg  Status:  Discontinued        125 mg Oral 4 times daily 10/30/11 0127 11/01/11 2307   10/30/11 0115   vancomycin (VANCOCIN) IVPB 1000 mg/200 mL premix        1,000 mg 200 mL/hr over 60 Minutes Intravenous  Once 10/30/11 0101 10/30/11 0343   10/30/11 0100   Ampicillin-Sulbactam (UNASYN) 3 g in sodium chloride 0.9 % 100 mL IVPB        3 g 100 mL/hr over 60 Minutes Intravenous  Once 10/30/11 0047 10/30/11  0214   10/30/11 0100   ciprofloxacin (CIPRO) IVPB 400 mg        400 mg 200 mL/hr over 60 Minutes Intravenous  Once 10/30/11 0057 10/30/11  0103          Assessment: 60yo male admitted with draining inguinal wound for several weeks as well as tender swelling of left scrotum for 2 days. Patient was started on vanc/zosyn on 8/8 for scrotal abscess. Culture results from abscess reveal enterococcus susceptible to vancomycin and ampicillin. Patient's weight has decreased to 46 kg. Due to this, his vancomycin dose will be decreased to 500 mg post-HD. Today, patient's bag of vancomycin was hung before dose could be changed. Communicated with RN, who was instructed to administer two-thirds of the bag to give an approximate dose of 500 mg. In light of culture results, consider changing vancomycin to ampicillin to narrow coverage.  If vancomycin is to be continued, will obtain a pre-HD vanc trough before the patient's next HD session and vanc dose. WBC had increased to 15.6 yesterday, and are down to 14.6 today. Patient has been afebrile.   Goal of Therapy:  Pre-HD vanc level 15-25  Plan:  1.  Change vancomycin dose to 500mg  IV on MWF post-HD 2.  Check pre-HD vanc trough before next HD session (Wednesday) if vancomycin is continued 3.  Continue zosyn 2.25 g IV every 8 hours  Lillia Pauls, PharmD Clinical Pharmacist Pager: [phone] Phone: [phone] 11/03/2011 1:32 PM

## 2011-11-04 ENCOUNTER — Inpatient Hospital Stay (HOSPITAL_COMMUNITY): Payer: Medicare Other

## 2011-11-04 ENCOUNTER — Encounter (HOSPITAL_COMMUNITY): Payer: Self-pay | Admitting: Vascular Surgery

## 2011-11-04 MED ORDER — SODIUM CHLORIDE 0.9 % IV SOLN
3.0000 g | INTRAVENOUS | Status: DC
  Administered 2011-11-04 – 2011-11-06 (×3): 3 g via INTRAVENOUS
  Filled 2011-11-04 (×5): qty 3

## 2011-11-04 MED ORDER — HEPARIN SODIUM (PORCINE) 1000 UNIT/ML DIALYSIS
20.0000 [IU]/kg | INTRAMUSCULAR | Status: DC | PRN
  Filled 2011-11-04: qty 1

## 2011-11-04 MED ORDER — LOPERAMIDE HCL 2 MG PO CAPS
2.0000 mg | ORAL_CAPSULE | Freq: Once | ORAL | Status: AC
  Administered 2011-11-04: 2 mg via ORAL
  Filled 2011-11-04: qty 1

## 2011-11-04 NOTE — Progress Notes (Signed)
[facility] PROGRESS NOTE  NICKLAUS ALVIAR OZH:[id number] DOB: 11/17/1950 DOA: 10/29/2011 PCP: Toma Deiters, MD  Assessment/Plan: Principal Problem:  *Scrotal abscess Active Problems:  CKD (chronic kidney disease) stage V requiring chronic dialysis  Hypertension  Peripheral vascular disease, unspecified  C. difficile colitis- recalcitrant  Non-healing surgical wound  Diarrhea  1. Nausea and vomiting: Patient has had no further vomiting and is requesting regular diet. I will change him to a renal diet starting today appear 2. Hypoglycemia: Patient has no history of diabetes. Patient's blood sugars are more stable today with no blood sugars dipping below 70. Insulin C-peptide levels noted to be within normal limits 3. Scrotal Abscess: S/P I&D. Cultures show moderate enterococcus sensitive to vancomycin and ampicillin. Will DC vancomycin continue ampicillin per pharmacy. 4. Fluid collection in Groin: Patient underwent exploration of left groin and repair of lymphocele. He also had placement of wound VAC . Wants by wound care nurse noted. I will defer to  vascular surgery for further management of lymphocele. 5.  ESRD: HD M/W/F continue per nephrology     6. PVD/ nonhealing surgical wound: Pt S/P debridement and placemnt of wound vac. 7. Diarrhea: Patient started having some diarrhea yesterday. Stool was sent and was found to be C. difficile negative. Stool cultures also show no pathogens. The patient however is having copious amounts of diarrhea and is requesting a flexible sigmoidoscopy. Flexible sigmoidoscopy will replace and Imodium ordered. 8. Disposition: This patient will likely need to go to LTAC prior to his discharge home. Code Status: Full Code Family Communication: N/A Disposition Plan: Not yet determined  Kaymarie Wynn A.  [facility] Pager [phone]. If 8PM-8AM, please contact night-coverage at [email or web address], password [facility] 11/04/2011, 3:35 PM  LOS: 6 days    Brief narrative: Mr. Danzy is a 61 year old male with end stage renal disease and peripheral vascular disease presented to the emergency room at [facility] with a four-day history of left hemiscrotal pain. He was seen and evaluated there. He states that he has had a previous scrotal abscess that he described as a "boil" that he popped himself in the past and which healed completly. He complains of discomfort with any movement of the scrotum, however he has not had any fevers.   Consultants:  Dr. Myra Gianotti- [facility]  Urology- [place]  Procedures:  S/P shunt transmetatarsal amputation and placement of wound VAC  Antibiotics:  Flagyl 8/8 >> 8/10  Oral Vancomycin 8/8 >> 8/10  IV vancomycin 8/8 >>8/12  Zosyn 8/8 >>8/12  Unasyn 8/13  HPI/Subjective: Patient states that he's feeling much better today. Patient denies large amounts of diarrhea however his nurse reports that he's having multiple loose stools. I've discussed with the patient and he is agreed for flexible sigmoidoscopy C. appear  Objective: Filed Vitals:   11/03/11 1500 11/03/11 2029 11/04/11 0711 11/04/11 1048  BP: 96/58 158/69 167/62 110/95  Pulse: 90 86 94 99  Temp: 99.4 F (37.4 C) 99.1 F (37.3 C) 99 F (37.2 C) 98.9 F (37.2 C)  TempSrc: Oral Oral Oral Oral  Resp: 14 18 18 18   Height:      Weight:   45.9 kg (101 lb 3.1 oz)   SpO2: 99% 94% 99% 99%   Weight change: -2.7 kg (-5 lb 15.2 oz)  Intake/Output Summary (Last 24 hours) at 11/04/11 1535 Last data filed at 11/04/11 1028  Gross per 24 hour  Intake    413 ml  Output      0 ml  Net  413 ml    General: Alert, awake, oriented x3, in no acute distress.  Neck: Trachea midline,  no masses, no thyromegal,y no JVD, no carotid bruit OROPHARYNX:  Moist, No exudate/ erythema/lesions.  Heart: Regular rate and rhythm, without murmurs, rubs, gallops, PMI non-displaced, no heaves or thrills on palpation.  Lungs: Clear to auscultation, no wheezing or rhonchi  noted.   Abdomen: Soft. Nontender, nondistended, no masses and no hepatosplenomegaly noted Patient has wound VAC in place in the left groin area. Genitalia: Patient has minimal drainage from his scrotal abscess today. Extremities: Wound VAC in place on the left foot wound. Otherwise no clubbing cyanosis or edema noted.  Data Reviewed: Basic Metabolic Panel:  Lab 11/03/11 1610 11/02/11 0701 11/01/11 1630 11/01/11 1051 10/31/11 0628 10/30/11 1200  NA 143 139 138 140 141 --  K 3.5 4.3 4.3 4.1 3.5 --  CL 98 101 101 -- 102 103  CO2 34* 25 25 -- 28 26  GLUCOSE 91 114* 107* 63* 113* --  BUN 18 41* 43* -- 35* 26*  CREATININE 3.72* 6.02* 6.33* -- 5.05* 4.20*  CALCIUM 9.3 9.3 9.4 -- 9.0 9.0  MG -- -- -- -- -- --  PHOS 5.6* 7.1* 7.0* -- -- --   Liver Function Tests:  Lab 11/03/11 0941 11/02/11 0701 11/01/11 1630  AST -- -- --  ALT -- -- --  ALKPHOS -- -- --  BILITOT -- -- --  PROT -- -- --  ALBUMIN 1.6* 1.5* 1.6*   No results found for this basename: LIPASE:5,AMYLASE:5 in the last 168 hours No results found for this basename: AMMONIA:5 in the last 168 hours CBC:  Lab 11/03/11 0940 11/02/11 0700 11/01/11 1630 11/01/11 1051 10/31/11 0628 10/30/11 1200 10/29/11 2027  WBC 14.6* 15.6* 12.1* -- 8.2 9.0 --  NEUTROABS -- -- -- -- 6.4 -- 7.7  HGB 8.0* 8.2* 7.9* 9.9* 8.3* -- --  HCT 26.2* 26.4* 25.5* 29.0* 26.6* -- --  MCV 97.8 97.4 97.7 -- 98.9 102.0* --  PLT 279 296 244 -- 223 193 --   Cardiac Enzymes: No results found for this basename: CKTOTAL:5,CKMB:5,CKMBINDEX:5,TROPONINI:5 in the last 168 hours BNP (last 3 results)  Basename 08/28/11 0540 08/27/11 0930 08/26/11 2305  PROBNP 21860.0* 19652.0* 25900.0*   CBG:  Lab 11/03/11 2245 11/03/11 2005 11/03/11 1851 11/03/11 1542 11/03/11 1401  GLUCAP 109* 116* 126* 86 84    Recent Results (from the past 240 hour(s))  STOOL CULTURE     Status: Normal   Collection Time   10/29/11  8:12 PM      Component Value Range Status Comment    Specimen Description STOOL   Final    Special Requests NONE   Final    Culture     Final    Value: NO SALMONELLA, SHIGELLA, CAMPYLOBACTER, YERSINIA, OR E.COLI 0157:H7 ISOLATED   Report Status 11/03/2011 FINAL   Final   SURGICAL PCR SCREEN     Status: Normal   Collection Time   10/30/11  6:48 AM      Component Value Range Status Comment   MRSA, PCR NEGATIVE  NEGATIVE Final    Staphylococcus aureus NEGATIVE  NEGATIVE Final   CULTURE, ROUTINE-ABSCESS     Status: Normal   Collection Time   10/30/11  6:27 PM      Component Value Range Status Comment   Specimen Description ABSCESS SCROTUM   Final    Special Requests NONE   Final    Gram Stain  Final    Value: NO WBC SEEN     FEW SQUAMOUS EPITHELIAL CELLS PRESENT     FEW GRAM POSITIVE COCCI IN PAIRS   Culture MODERATE ENTEROCOCCUS SPECIES   Final    Report Status 11/02/2011 FINAL   Final    Organism ID, Bacteria ENTEROCOCCUS SPECIES   Final   WOUND CULTURE     Status: Normal   Collection Time   11/01/11 12:28 PM      Component Value Range Status Comment   Specimen Description WOUND LEFT GROIN   Final    Special Requests SWABED FLUID FROM GROIN, PT ON VANCO,ZOSYN,FLAGYL   Final    Gram Stain     Final    Value: ABUNDANT WBC PRESENT, PREDOMINANTLY MONONUCLEAR     NO ORGANISMS SEEN   Culture NO GROWTH 2 DAYS   Final    Report Status 11/03/2011 FINAL   Final   ANAEROBIC CULTURE     Status: Normal (Preliminary result)   Collection Time   11/01/11 12:28 PM      Component Value Range Status Comment   Specimen Description WOUND LEFT GROIN   Final    Special Requests SWABED FLUID FROM GROIN, PT ON VANCO,ZOSYN,FLAGYL   Final    Gram Stain     Final    Value: ABUNDANT WBC PRESENT, PREDOMINANTLY MONONUCLEAR     NO ORGANISMS SEEN   Culture     Final    Value: NO ANAEROBES ISOLATED; CULTURE IN PROGRESS FOR 5 DAYS   Report Status PENDING   Incomplete   CLOSTRIDIUM DIFFICILE BY PCR     Status: Normal   Collection Time   11/04/11 12:43 AM       Component Value Range Status Comment   C difficile by pcr NEGATIVE  NEGATIVE Final      Studies: Portable Chest 1 View  10/12/2011  *RADIOLOGY REPORT*  Clinical Data: SOB, evaluate for pneumonia  PORTABLE CHEST - 1 VIEW  Comparison: 10/11/2011  Findings: Cardiomegaly with mild interstitial edema and moderate right pleural effusion.  Suspected small left pleural effusion.  Associated right lower lobe opacity, likely atelectasis, underlying pneumonia not excluded.  No pneumothorax.  Stable left IJ venous catheter.  IMPRESSION: Cardiomegaly with mild interstitial edema and moderate right pleural effusion.  Suspected small left pleural effusion.  Associate right lower lobe opacity, likely atelectasis, underlying pneumonia not excluded.  Original Report Authenticated By: Charline Bills, M.D.   Dg Abd Acute W/chest  10/11/2011  *RADIOLOGY REPORT*  Clinical Data: Abdominal pain, nausea and vomiting.  ACUTE ABDOMEN SERIES (ABDOMEN 2 VIEW & CHEST 1 VIEW)  Comparison: Abdominal radiographs 08/28/2011.  Chest x-ray 09/10/2011.  Findings: Left internal jugular PermCath with the tips terminating in the right atrium and at the superior cavoatrial junction is unchanged.  Lung volumes are low.  Linear opacities in the left mid and lower lung are most consistent with atelectasis.  Patchy airspace consolidation and volume loss in the right middle and lower lobes.  Trace left and small right-sided pleural effusions. No pneumoperitoneum.  Supine and upright views of the abdomen demonstrate gas and stool scattered throughout the colon extending to the level of the distal rectum.  No pathologic distension of small bowel is noted.  Midline surgical staples are again noted.  Numerous vascular calcifications.  In addition, there are numerous egg-shell type calcifications in the left upper quadrant of the abdomen which appear to correspond with numerous calcified cystic lesions based on comparison with prior CT scans.  IMPRESSION:  1.  Nonobstructive bowel gas pattern. 2.  No pneumoperitoneum. 3.  Support apparatus and postoperative changes, as above. 4.  Air space consolidation and atelectasis in the right middle and lower lobes, concerning for pneumonia and/or sequelae of aspiration. 5.  Small right and trace left-sided pleural effusions. 6.  Atherosclerosis.  Original Report Authenticated By: Florencia Reasons, M.D.   Dg Foot Complete Left  10/17/2011  *RADIOLOGY REPORT*  Clinical Data: Wound infection  LEFT FOOT - COMPLETE 3+ VIEW  Comparison: None.  Findings: The patient is status post amputation of the foot beyond the proximal aspect of the five metatarsals.  No acute fracture. No dislocation.  No periosteal reaction.  No destructive bone lesion.  Vascular calcifications are prominent.  An open wound at the amputation site is noted.  IMPRESSION: Status post forefoot amputation.  Open wound is present at the amputation site.  No acute bony pathology.  Original Report Authenticated By: Donavan Burnet, M.D.    Scheduled Meds:    . alteplase  4 mg Intracatheter Once  . ampicillin-sulbactam (UNASYN) IV  3 g Intravenous Q24H  . aspirin  81 mg Oral Daily  . darbepoetin (ARANESP) injection - DIALYSIS  100 mcg Intravenous Q Mon-HD  . feeding supplement (NEPRO CARB STEADY)  237 mL Oral BID BM  . gabapentin  200 mg Oral Daily  . heparin subcutaneous  5,000 Units Subcutaneous Q8H  . isosorbide mononitrate  30 mg Oral Daily  . lidocaine-EPINEPHrine  10 mL Intradermal Once  . multivitamin  1 tablet Oral QHS  . pantoprazole  40 mg Oral Q1200  . sevelamer  1,600 mg Oral TID WC  . simvastatin  5 mg Oral QHS  . sodium chloride  3 mL Intravenous Q12H  . DISCONTD: piperacillin-tazobactam (ZOSYN)  IV  2.25 g Intravenous Q8H  . DISCONTD: vancomycin  500 mg Intravenous Q M,W,F-HD  . DISCONTD: vancomycin  750 mg Intravenous Q M,W,F-HD   Continuous Infusions:   Principal Problem:  *Scrotal abscess Active Problems:  CVA  CKD  (chronic kidney disease) stage V requiring chronic dialysis  Hypertension  Peripheral vascular disease, unspecified  C. difficile colitis- recalcitrant  ESRD on hemodialysis  Nausea and vomiting in adult  Non-healing surgical wound  Diarrhea  Hypoglycemia  Lymphocele  Infection due to enterococcus

## 2011-11-04 NOTE — Progress Notes (Signed)
ANTIBIOTIC CONSULT NOTE - FOLLOW UP  Pharmacy Consult for Unasyn Indication: scrotal abscess  No Known Allergies  Patient Measurements: Height: 5\' 6"  (167.6 cm) Weight: 101 lb 3.1 oz (45.9 kg) IBW/kg (Calculated) : 63.8   Vital Signs: Temp: 98.9 F (37.2 C) (08/13 1048) Temp src: Oral (08/13 1048) BP: 110/95 mmHg (08/13 1048) Pulse Rate: 99  (08/13 1048) Intake/Output from previous day: 08/12 0701 - 08/13 0700 In: 230 [P.O.:180; IV Piggyback:50] Out: 2276  Intake/Output from this shift: Total I/O In: 243 [P.O.:240; I.V.:3] Out: -   Labs:  Basename 11/03/11 0941 11/03/11 0940 11/02/11 0701 11/02/11 0700 11/01/11 1630  WBC -- 14.6* -- 15.6* 12.1*  HGB -- 8.0* -- 8.2* 7.9*  PLT -- 279 -- 296 244  LABCREA -- -- -- -- --  CREATININE 3.72* -- 6.02* -- 6.33*   Estimated Creatinine Clearance: 13.4 ml/min (by C-G formula based on Cr of 3.72). No results found for this basename: VANCOTROUGH:2,VANCOPEAK:2,VANCORANDOM:2,GENTTROUGH:2,GENTPEAK:2,GENTRANDOM:2,TOBRATROUGH:2,TOBRAPEAK:2,TOBRARND:2,AMIKACINPEAK:2,AMIKACINTROU:2,AMIKACIN:2, in the last 72 hours   Microbiology: Recent Results (from the past 720 hour(s))  MRSA PCR SCREENING     Status: Abnormal   Collection Time   10/11/11  4:35 PM      Component Value Range Status Comment   MRSA by PCR POSITIVE (*) NEGATIVE Final   STOOL CULTURE     Status: Normal   Collection Time   10/29/11  8:12 PM      Component Value Range Status Comment   Specimen Description STOOL   Final    Special Requests NONE   Final    Culture     Final    Value: NO SALMONELLA, SHIGELLA, CAMPYLOBACTER, YERSINIA, OR E.COLI 0157:H7 ISOLATED   Report Status 11/03/2011 FINAL   Final   SURGICAL PCR SCREEN     Status: Normal   Collection Time   10/30/11  6:48 AM      Component Value Range Status Comment   MRSA, PCR NEGATIVE  NEGATIVE Final    Staphylococcus aureus NEGATIVE  NEGATIVE Final   CULTURE, ROUTINE-ABSCESS     Status: Normal   Collection Time   10/30/11  6:27 PM      Component Value Range Status Comment   Specimen Description ABSCESS SCROTUM   Final    Special Requests NONE   Final    Gram Stain     Final    Value: NO WBC SEEN     FEW SQUAMOUS EPITHELIAL CELLS PRESENT     FEW GRAM POSITIVE COCCI IN PAIRS   Culture MODERATE ENTEROCOCCUS SPECIES   Final    Report Status 11/02/2011 FINAL   Final    Organism ID, Bacteria ENTEROCOCCUS SPECIES   Final   WOUND CULTURE     Status: Normal   Collection Time   11/01/11 12:28 PM      Component Value Range Status Comment   Specimen Description WOUND LEFT GROIN   Final    Special Requests SWABED FLUID FROM GROIN, PT ON VANCO,ZOSYN,FLAGYL   Final    Gram Stain     Final    Value: ABUNDANT WBC PRESENT, PREDOMINANTLY MONONUCLEAR     NO ORGANISMS SEEN   Culture NO GROWTH 2 DAYS   Final    Report Status 11/03/2011 FINAL   Final   ANAEROBIC CULTURE     Status: Normal (Preliminary result)   Collection Time   11/01/11 12:28 PM      Component Value Range Status Comment   Specimen Description WOUND LEFT GROIN   Final  Special Requests SWABED FLUID FROM GROIN, PT ON VANCO,ZOSYN,FLAGYL   Final    Gram Stain     Final    Value: ABUNDANT WBC PRESENT, PREDOMINANTLY MONONUCLEAR     NO ORGANISMS SEEN   Culture     Final    Value: NO ANAEROBES ISOLATED; CULTURE IN PROGRESS FOR 5 DAYS   Report Status PENDING   Incomplete   CLOSTRIDIUM DIFFICILE BY PCR     Status: Normal   Collection Time   11/04/11 12:43 AM      Component Value Range Status Comment   C difficile by pcr NEGATIVE  NEGATIVE Final     Anti-infectives     Start     Dose/Rate Route Frequency Ordered Stop   11/28/11 1000   vancomycin (VANCOCIN) 50 mg/mL oral solution 125 mg  Status:  Discontinued        125 mg Oral Every 3 DAYS 10/30/11 0127 11/01/11 2307   11/20/11 1000   vancomycin (VANCOCIN) 50 mg/mL oral solution 125 mg  Status:  Discontinued        125 mg Oral Every other day 10/30/11 0127 11/01/11 2307   11/13/11 1000    vancomycin (VANCOCIN) 50 mg/mL oral solution 125 mg  Status:  Discontinued        125 mg Oral Daily 10/30/11 0127 11/01/11 2307   11/05/11 2200   vancomycin (VANCOCIN) 50 mg/mL oral solution 125 mg  Status:  Discontinued        125 mg Oral 2 times daily 10/30/11 0127 11/01/11 2307   11/05/11 1200   vancomycin (VANCOCIN) 500 mg in sodium chloride 0.9 % 100 mL IVPB  Status:  Discontinued        500 mg 100 mL/hr over 60 Minutes Intravenous Every M-W-F (Hemodialysis) 11/03/11 1637 11/03/11 1942   11/03/11 1200   vancomycin (VANCOCIN) 750 mg in sodium chloride 0.9 % 150 mL IVPB  Status:  Discontinued        750 mg 150 mL/hr over 60 Minutes Intravenous Every M-W-F (Hemodialysis) 11/01/11 1303 11/03/11 1637   11/02/11 1000   vancomycin (VANCOCIN) 750 mg in sodium chloride 0.9 % 150 mL IVPB        750 mg 150 mL/hr over 60 Minutes Intravenous  Once 11/02/11 0909 11/02/11 1205   11/01/11 1700   vancomycin (VANCOCIN) 750 mg in sodium chloride 0.9 % 150 mL IVPB  Status:  Discontinued        750 mg 150 mL/hr over 60 Minutes Intravenous  Once 11/01/11 1301 11/02/11 0908   10/31/11 1200   vancomycin (VANCOCIN) 750 mg in sodium chloride 0.9 % 150 mL IVPB  Status:  Discontinued        750 mg 150 mL/hr over 60 Minutes Intravenous Every M-W-F (Hemodialysis) 10/30/11 0407 11/01/11 1303   10/30/11 0600   piperacillin-tazobactam (ZOSYN) IVPB 2.25 g  Status:  Discontinued        2.25 g 100 mL/hr over 30 Minutes Intravenous Every 8 hours 10/30/11 0407 11/04/11 1244   10/30/11 0415   vancomycin (VANCOCIN) 500 mg in sodium chloride 0.9 % 100 mL IVPB        500 mg 100 mL/hr over 60 Minutes Intravenous  Once 10/30/11 0407 10/30/11 0535   10/30/11 0130   metroNIDAZOLE (FLAGYL) tablet 500 mg  Status:  Discontinued        500 mg Oral 3 times per day 10/30/11 0127 11/01/11 2307   10/30/11 0130   vancomycin (VANCOCIN) 50 mg/mL oral  solution 125 mg  Status:  Discontinued        125 mg Oral 4 times daily  10/30/11 0127 11/01/11 2307   10/30/11 0115   vancomycin (VANCOCIN) IVPB 1000 mg/200 mL premix        1,000 mg 200 mL/hr over 60 Minutes Intravenous  Once 10/30/11 0101 10/30/11 0343   10/30/11 0100   Ampicillin-Sulbactam (UNASYN) 3 g in sodium chloride 0.9 % 100 mL IVPB        3 g 100 mL/hr over 60 Minutes Intravenous  Once 10/30/11 0047 10/30/11 0214   10/30/11 0100   ciprofloxacin (CIPRO) IVPB 400 mg        400 mg 200 mL/hr over 60 Minutes Intravenous  Once 10/30/11 0057 10/30/11 0103          Assessment: 60yo male admitted with draining inguinal wound for several weeks as well as tender swelling of left scrotum for 2 days. Patient was started on vanc/zosyn on 8/8 for scrotal abscess. Culture results from abscess reveal enterococcus susceptible to vancomycin and ampicillin. Vanc d/c'ed 8/12 and zosyn d/c'ed today as patient is to be transitioned to Unasyn for coverage. WBC are 14.6 yesterday (down from 15.6 the day prior). No labs today. Patient is afebrile.   Goal of Therapy:  Eradication of infection. Clinical improvement.  Plan:  Unasyn 3g every 24 hours beginning tonight at 2200. F/U LOT  Lillia Pauls, PharmD Clinical Pharmacist Pager: [phone] Phone: [phone] 11/04/2011 1:35 PM

## 2011-11-04 NOTE — Progress Notes (Signed)
Subjective:  No new complaints  Objective Vital signs in last 24 hours: Filed Vitals:   11/03/11 1400 11/03/11 1500 11/03/11 2029 11/04/11 0711  BP:  96/58 158/69 167/62  Pulse:  90 86 94  Temp:  99.4 F (37.4 C) 99.1 F (37.3 C) 99 F (37.2 C)  TempSrc:  Oral Oral Oral  Resp:  14 18 18   Height:      Weight: 46 kg (101 lb 6.6 oz)   45.9 kg (101 lb 3.1 oz)  SpO2:  99% 94% 99%   Weight change: -2.7 kg (-5 lb 15.2 oz)  Intake/Output Summary (Last 24 hours) at 11/04/11 1043 Last data filed at 11/04/11 1028  Gross per 24 hour  Intake    233 ml  Output   2276 ml  Net  -2043 ml   Labs: Basic Metabolic Panel:  Lab 11/03/11 4782 11/02/11 0701 11/01/11 1630  NA 143 139 138  K 3.5 4.3 4.3  CL 98 101 101  CO2 34* 25 25  GLUCOSE 91 114* 107*  BUN 18 41* 43*  CREATININE 3.72* 6.02* 6.33*  CALCIUM 9.3 9.3 9.4  ALB -- -- --  PHOS 5.6* 7.1* 7.0*   Liver Function Tests:  Lab 11/03/11 0941 11/02/11 0701 11/01/11 1630  AST -- -- --  ALT -- -- --  ALKPHOS -- -- --  BILITOT -- -- --  PROT -- -- --  ALBUMIN 1.6* 1.5* 1.6*   No results found for this basename: LIPASE:3,AMYLASE:3 in the last 168 hours No results found for this basename: AMMONIA:3 in the last 168 hours CBC:  Lab 11/03/11 0940 11/02/11 0700 11/01/11 1630 10/31/11 0628 10/30/11 1200 10/29/11 2027  WBC 14.6* 15.6* 12.1* -- -- --  NEUTROABS -- -- -- 6.4 -- 7.7  HGB 8.0* 8.2* 7.9* -- -- --  HCT 26.2* 26.4* 25.5* -- -- --  MCV 97.8 97.4 97.7 98.9 102.0* --  PLT 279 296 244 -- -- --   Cardiac Enzymes: No results found for this basename: CKTOTAL:5,CKMB:5,CKMBINDEX:5,TROPONINI:5 in the last 168 hours CBG:  Lab 11/03/11 2245 11/03/11 2005 11/03/11 1851 11/03/11 1542 11/03/11 1401  GLUCAP 109* 116* 126* 86 84    Iron Studies:   Basename 11/01/11 1630  IRON 14*  TIBC 86*  TRANSFERRIN --  FERRITIN 1001*   Studies/Results: Dg Abd 1 View  11/04/2011  *RADIOLOGY REPORT*  Clinical Data: Abdominal tenderness   ABDOMEN - 1 VIEW  Comparison: Abdomen film of 11/03/2011 and CT abdomen pelvis of 10/31/2011  Findings: No present bowel obstruction is seen.  Both large and small bowel gas is present without distention.  Calcifications in the left upper quadrant are again noted as previously described.  IMPRESSION: No evidence of bowel obstruction.  Original Report Authenticated By: Juline Patch, M.D.   Dg Abd 1 View  11/03/2011  *RADIOLOGY REPORT*  Clinical Data: Abdominal pain.  Ileus.  ABDOMEN - 1 VIEW  Comparison: 10/31/2011.  Findings: Stable appearance of calcifications in the left upper quadrant.  The bowel gas pattern appears within normal limits, nonobstructive.  Extensive atherosclerosis.  Stool is present within the rectum. Oral contrast is present within small bowel, presumably administered in conjunction with prior CT.  IMPRESSION: Normal bowel gas pattern. Persistence of small bowel oral contrast suggests ileus.  Original Report Authenticated By: Andreas Newport, M.D.   Medications: Infusions:    Scheduled Medications:    . alteplase  4 mg Intracatheter Once  . aspirin  81 mg Oral Daily  . darbepoetin (  ARANESP) injection - DIALYSIS  100 mcg Intravenous Q Mon-HD  . feeding supplement (NEPRO CARB STEADY)  237 mL Oral BID BM  . gabapentin  200 mg Oral Daily  . heparin subcutaneous  5,000 Units Subcutaneous Q8H  . isosorbide mononitrate  30 mg Oral Daily  . lidocaine-EPINEPHrine  10 mL Intradermal Once  . multivitamin  1 tablet Oral QHS  . pantoprazole  40 mg Oral Q1200  . piperacillin-tazobactam (ZOSYN)  IV  2.25 g Intravenous Q8H  . sevelamer  1,600 mg Oral TID WC  . simvastatin  5 mg Oral QHS  . sodium chloride  3 mL Intravenous Q12H  . DISCONTD: vancomycin  500 mg Intravenous Q M,W,F-HD  . DISCONTD: vancomycin  750 mg Intravenous Q M,W,F-HD    have reviewed scheduled and prn medications.  Physical Exam: General: chronically ill appearing in air bed.  NAD Heart: RRR Lungs: anteriorly  clear Abdomen: soft, nontender Extremities: Left groin wound with VAC in place, L TMA with VAC in place. No LE edema  Dialysis Access: L sided chest catheter.    Summary Pt is a 61 y.o. yo male with ESRD who was admitted on 10/29/2011 with  Poorly healing left transmet wound and continued drainage of left groin wound.    Assessment/Plan:  1. 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   2. ESRD- [facility] MWF via PC.  Stable volume, no excess. 3. Anemia- due to CKD and blood loss, trending down.   Giving aranesp, no iron due to high ferritin  4. Secondary hyperparathyroidism- phos 7.1, calc 9.3, adj Ca 11.3. PTH 278, not on vit D or any Ca products. High Ca due to immobilization most likely. Renvela started.  5. HTN/volume- variable but mostly reasonable.  No BP meds for now. 6. DM-hypoglycemia- per primary team.  7. Dispo- possible LTAC placement   Vinson Moselle  MD [facility] [phone] pgr    [phone] cell 11/04/2011, 10:43 AM

## 2011-11-05 ENCOUNTER — Encounter (HOSPITAL_COMMUNITY): Payer: Self-pay | Admitting: Physician Assistant

## 2011-11-05 ENCOUNTER — Inpatient Hospital Stay (HOSPITAL_COMMUNITY): Payer: Medicare Other

## 2011-11-05 DIAGNOSIS — I959 Hypotension, unspecified: Secondary | ICD-10-CM

## 2011-11-05 DIAGNOSIS — R579 Shock, unspecified: Secondary | ICD-10-CM

## 2011-11-05 DIAGNOSIS — R0902 Hypoxemia: Secondary | ICD-10-CM

## 2011-11-05 DIAGNOSIS — K5520 Angiodysplasia of colon without hemorrhage: Secondary | ICD-10-CM

## 2011-11-05 DIAGNOSIS — N186 End stage renal disease: Secondary | ICD-10-CM

## 2011-11-05 DIAGNOSIS — R578 Other shock: Secondary | ICD-10-CM

## 2011-11-05 DIAGNOSIS — D649 Anemia, unspecified: Secondary | ICD-10-CM

## 2011-11-05 DIAGNOSIS — K922 Gastrointestinal hemorrhage, unspecified: Secondary | ICD-10-CM

## 2011-11-05 DIAGNOSIS — N498 Inflammatory disorders of other specified male genital organs: Secondary | ICD-10-CM

## 2011-11-05 DIAGNOSIS — J9600 Acute respiratory failure, unspecified whether with hypoxia or hypercapnia: Secondary | ICD-10-CM

## 2011-11-05 LAB — COMPREHENSIVE METABOLIC PANEL
ALT: 8 U/L (ref 0–53)
BUN: 36 mg/dL — ABNORMAL HIGH (ref 6–23)
CO2: 25 mEq/L (ref 19–32)
Calcium: 8.3 mg/dL — ABNORMAL LOW (ref 8.4–10.5)
Creatinine, Ser: 2.27 mg/dL — ABNORMAL HIGH (ref 0.50–1.35)
GFR calc Af Amer: 34 mL/min — ABNORMAL LOW (ref >90)
GFR calc non Af Amer: 29 mL/min — ABNORMAL LOW (ref >90)
Glucose, Bld: 60 mg/dL — ABNORMAL LOW (ref 70–99)
Sodium: 140 mEq/L (ref 135–145)
Total Protein: 5 g/dL — ABNORMAL LOW (ref 6.0–8.3)

## 2011-11-05 LAB — TYPE AND SCREEN
Antibody Screen: NEGATIVE
Unit division: 0
Unit division: 0

## 2011-11-05 LAB — CBC WITH DIFFERENTIAL/PLATELET
Eosinophils Absolute: 0.1 10*3/uL (ref 0.0–0.7)
Eosinophils Relative: 1 % (ref 0–5)
HCT: 12.8 % — ABNORMAL LOW (ref 39.0–52.0)
Lymphocytes Relative: 7 % — ABNORMAL LOW (ref 12–46)
Lymphs Abs: 0.9 10*3/uL (ref 0.7–4.0)
MCH: 29.7 pg (ref 26.0–34.0)
MCV: 100 fL (ref 78.0–100.0)
Monocytes Absolute: 0.6 10*3/uL (ref 0.1–1.0)
Platelets: 304 10*3/uL (ref 150–400)
RBC: 1.28 MIL/uL — ABNORMAL LOW (ref 4.22–5.81)

## 2011-11-05 LAB — PROTIME-INR: Prothrombin Time: 17.4 seconds — ABNORMAL HIGH (ref 11.6–15.2)

## 2011-11-05 LAB — CBC
HCT: 12.8 % — ABNORMAL LOW (ref 39.0–52.0)
MCH: 29.5 pg (ref 26.0–34.0)
MCHC: 29.7 g/dL — ABNORMAL LOW (ref 30.0–36.0)
MCV: 99.2 fL (ref 78.0–100.0)
Platelets: 252 10*3/uL (ref 150–400)
RDW: 18.4 % — ABNORMAL HIGH (ref 11.5–15.5)
WBC: 11.2 10*3/uL — ABNORMAL HIGH (ref 4.0–10.5)

## 2011-11-05 LAB — MAGNESIUM: Magnesium: 2 mg/dL (ref 1.5–2.5)

## 2011-11-05 LAB — PHOSPHORUS: Phosphorus: 3.2 mg/dL (ref 2.3–4.6)

## 2011-11-05 LAB — BLOOD GAS, ARTERIAL
Acid-Base Excess: 4.1 mmol/L — ABNORMAL HIGH (ref 0.0–2.0)
Bicarbonate: 26.6 mEq/L — ABNORMAL HIGH (ref 20.0–24.0)
Drawn by: 31996
Patient temperature: 98.6
TCO2: 27.5 mmol/L (ref 0–100)
pH, Arterial: 7.559 — ABNORMAL HIGH (ref 7.350–7.450)

## 2011-11-05 LAB — LACTIC ACID, PLASMA: Lactic Acid, Venous: 6.7 mmol/L — ABNORMAL HIGH (ref 0.5–2.2)

## 2011-11-05 LAB — GLUCOSE, CAPILLARY: Glucose-Capillary: 102 mg/dL — ABNORMAL HIGH (ref 70–99)

## 2011-11-05 LAB — TROPONIN I: Troponin I: 1.55 ng/mL (ref <0.30)

## 2011-11-05 MED ORDER — SODIUM CHLORIDE 0.9 % IV SOLN
8.0000 mg/h | INTRAVENOUS | Status: DC
  Administered 2011-11-05 – 2011-11-06 (×2): 8 mg/h via INTRAVENOUS
  Filled 2011-11-05 (×5): qty 80

## 2011-11-05 MED ORDER — FENTANYL CITRATE 0.05 MG/ML IJ SOLN
25.0000 ug | INTRAMUSCULAR | Status: DC | PRN
  Administered 2011-11-05 (×2): 50 ug via INTRAVENOUS
  Filled 2011-11-05 (×2): qty 2

## 2011-11-05 MED ORDER — FAMOTIDINE IN NACL 20-0.9 MG/50ML-% IV SOLN
20.0000 mg | INTRAVENOUS | Status: DC
  Administered 2011-11-05: 20 mg via INTRAVENOUS
  Filled 2011-11-05: qty 50

## 2011-11-05 MED ORDER — FENTANYL CITRATE 0.05 MG/ML IJ SOLN
25.0000 ug | INTRAMUSCULAR | Status: AC | PRN
  Administered 2011-11-05 – 2011-11-06 (×4): 50 ug via INTRAVENOUS
  Filled 2011-11-05 (×3): qty 2

## 2011-11-05 MED ORDER — PANTOPRAZOLE SODIUM 40 MG IV SOLR
40.0000 mg | Freq: Once | INTRAVENOUS | Status: DC
  Filled 2011-11-05: qty 40

## 2011-11-05 NOTE — Progress Notes (Signed)
eLink Physician-Brief Progress Note Patient Name: [name] DOB: 1950/08/09 MRN: [id number]  Date of Service  11/05/2011   HPI/Events of Note  Npo, soft bP, c/o foot pain   eICU Interventions  fentnayl , low dose prn   Intervention Category Intermediate Interventions: Pain - evaluation and management  Treon Kehl V. 11/05/2011, 3:46 PM

## 2011-11-05 NOTE — Consult Note (Signed)
Patient seen, examined, and I agree with the above documentation, including the assessment and plan. Complicated PMH of late with recent surgery, PVD, ESRD, c diff now with melena and acute on chronic anemia.  Concern is for UGI bleeding.   Hgb 3.8 down from 8.0 2 days ago.  Hemodynamics are stable.  He needs EGD.  Starting PPI gtt.  He needs to be transfused to target Hgb > 8.  Also he ate this afternoon, and thus will plan EGD tomorrow morning. Further recommendations thereafter. Maintain 2 PIVs at all times

## 2011-11-05 NOTE — Consult Note (Addendum)
WOC re-consult Note:  Pt transferred to ICU after dialysis; will not be leaving the hospital today as noted previously might have been possible.  Discussed dressing changes and plan of care with [facility], VVS PA.Plan to change vac dressings with Dr Myra Gianotti  If he is available at bedside to assess left groin and left foot on Thursday AM at 0730.   Cammie Mcgee, RN, MSN, [employer]  [phone]

## 2011-11-05 NOTE — Progress Notes (Signed)
[facility] PROGRESS NOTE  [name]:[id number] DOB: 09/14/1950 DOA: 10/29/2011 PCP: Toma Deiters, MD  Assessment/Plan: Principal Problem:  *Hypotension  *Hypoxia  * Scrotal abscess Active Problems:  CKD (chronic kidney disease) stage V requiring chronic dialysis  Hypertension  Peripheral vascular disease, unspecified  C. difficile colitis- recalcitrant  Non-healing surgical wound  Diarrhea  *  Hypotension with hypoxia.  DDx includes sepsis, GIB, autonomic changes secondary to HD, hypervolemia during dialysis.  Patient is on DVT prophylaxis. -  Transfer to ICU for closer monitoring, including telemetry and continuous pulse oximetry -  F/U labs, including blood culture.  Urine culture not obtained as patient anuric -  Consider broadening abx from unasyn.  Patient is MRSA positive and was previously on vancomycin which was recently discontinued. -  F/u stool hemoccult -  Nephrology aware of events *  Nausea and vomiting, resolved. Hypoglycemia: Patient has no history of diabetes. Patient's blood sugars stable x 24h.  Insulin C-peptide levels noted to be within normal limits *  Scrotal Abscess: S/P I&D.  Enterococcus sensitive to amp.  Patient on unasyn.  Vanc and zosyn d/ced on 8/12.  Fluid collection in Groin: Patient underwent exploration of left groin and repair of lymphocele. He also had placement of wound VAC .  Vascular surgery following lymphocele. *  ESRD: HD M/W/F continue per nephrology     *  PVD/ nonhealing surgical wound: Pt S/P debridement and placemnt of wound vac *  Diarrhea: Patient started having some diarrhea 8/12. C. difficile negative. Stool cultures also show no pathogens. Patient was ordered imodium, but is now having melanotic appearing stools.  C-diff pending.  To LTAC pending stable blood pressure and breathing.  Code Status: Full Code Family Communication: N/A Disposition Plan: Not yet determined  Malayja Freund, [facility]  [facility] Pager [phone]. If 8PM-8AM, please contact night-coverage at [email or web address], password [facility] 11/05/2011, 7:40 AM  LOS: 7 days   Brief narrative: [name] is a 61 year old male with end stage renal disease and peripheral vascular disease presented to the emergency room at [facility] with a four-day history of left hemiscrotal pain. He was seen and evaluated there. He states that he has had a previous scrotal abscess that he described as a "boil" that he popped himself in the past and which healed completly. He complains of discomfort with any movement of the scrotum, however he has not had any fevers.   Consultants:  Dr. Myra Gianotti- [facility]  Urology- [place]  Wound care  Critical care  Procedures:  S/P shunt transmetatarsal amputation and placement of wound VAC  Antibiotics:  Flagyl 8/8 >> 8/10  Oral Vancomycin 8/8 >> 8/10  IV vancomycin 8/8 >>8/12  Zosyn 8/8 >>8/12  Unasyn 8/13  HPI/Subjective:  Called by bedside nurse Verdon Cummins because he had received a phone call from HD saying the patient was hypotensive to the 40s-60s systolic and hypoxic to 76%.  During HD, secondary to hypotension, he was given 2L of IVF after which time he became hypoxic.  Patient was transferred to the floor on nonrebreather, breathing 20 a minute with oxygen saturations in the low 90s.  He was somewhat sleepy and confused and could not state the month or day, but was oriented to place and person.  He stated he was Enslie Sahota of breath, but denied pain, nausea, vomiting.  He has had diarrhea previously, but only a few since the day prior, which is now more black in appearance.  BP was auscultated around 98 systolic, but  was difficult to assess.  ABG, CXR, and other labs were obtained. EKG with deeper ST segment depressions in V4-V6 and new T wave inversion in V3. ABG revealed a respiratory alkalosis with adequate pO2 on nonrebreather so mask was transitioned to nasal canula.  Dr. Molli Knock from critical  care was consulted and assessed the patient and agreed for transport to the ICU for further assessment and management.    Objective: Filed Vitals:   11/05/11 0652 11/05/11 0700 11/05/11 0715 11/05/11 0731  BP: 113/44 107/50 125/51 110/57  Pulse: 104 101 103 98  Temp:      TempSrc:      Resp: 18 10 16 12   Height:      Weight:      SpO2:       Weight change: -2.7 kg (-5 lb 15.2 oz)  Intake/Output Summary (Last 24 hours) at 11/05/11 0740 Last data filed at 11/04/11 2202  Gross per 24 hour  Intake    343 ml  Output      0 ml  Net    343 ml    General:   Initially sleepy.  Oriented to person, place, 2013, but not month, date, or season.  Said "[place]" when asked to guess the month. Neck:   Supple OROPHARYNX:  Moist, No exudate/ erythema/lesions.  Heart:  RRR, 1/6 murmurr Lungs:   Anterior scattered expiratory wheeze, I:E 1:3 Abdomen:   Soft, nontender, nondistended.  More firm on the right quadrants without rebound.  Voluntary guarding. Genitalia:  Wound vac in place on left groin.  Scrotal wound weeping a small amount of creamy light green discharge.   Extremities:  Cold, dry skin with wound vac in place over anterior foot  Data Reviewed: Basic Metabolic Panel:  Lab 11/03/11 1610 11/02/11 0701 11/01/11 1630 11/01/11 1051 10/31/11 0628 10/30/11 1200  NA 143 139 138 140 141 --  K 3.5 4.3 4.3 4.1 3.5 --  CL 98 101 101 -- 102 103  CO2 34* 25 25 -- 28 26  GLUCOSE 91 114* 107* 63* 113* --  BUN 18 41* 43* -- 35* 26*  CREATININE 3.72* 6.02* 6.33* -- 5.05* 4.20*  CALCIUM 9.3 9.3 9.4 -- 9.0 9.0  MG -- -- -- -- -- --  PHOS 5.6* 7.1* 7.0* -- -- --   Liver Function Tests:  Lab 11/03/11 0941 11/02/11 0701 11/01/11 1630  AST -- -- --  ALT -- -- --  ALKPHOS -- -- --  BILITOT -- -- --  PROT -- -- --  ALBUMIN 1.6* 1.5* 1.6*   No results found for this basename: LIPASE:5,AMYLASE:5 in the last 168 hours No results found for this basename: AMMONIA:5 in the last 168  hours CBC:  Lab 11/03/11 0940 11/02/11 0700 11/01/11 1630 11/01/11 1051 10/31/11 0628 10/30/11 1200 10/29/11 2027  WBC 14.6* 15.6* 12.1* -- 8.2 9.0 --  NEUTROABS -- -- -- -- 6.4 -- 7.7  HGB 8.0* 8.2* 7.9* 9.9* 8.3* -- --  HCT 26.2* 26.4* 25.5* 29.0* 26.6* -- --  MCV 97.8 97.4 97.7 -- 98.9 102.0* --  PLT 279 296 244 -- 223 193 --   Cardiac Enzymes: No results found for this basename: CKTOTAL:5,CKMB:5,CKMBINDEX:5,TROPONINI:5 in the last 168 hours BNP (last 3 results)  Basename 08/28/11 0540 08/27/11 0930 08/26/11 2305  PROBNP 21860.0* 19652.0* 25900.0*   CBG:  Lab 11/03/11 2245 11/03/11 2005 11/03/11 1851 11/03/11 1542 11/03/11 1401  GLUCAP 109* 116* 126* 86 84    Recent Results (from the past 240 hour(s))  STOOL CULTURE     Status: Normal   Collection Time   10/29/11  8:12 PM      Component Value Range Status Comment   Specimen Description STOOL   Final    Special Requests NONE   Final    Culture     Final    Value: NO SALMONELLA, SHIGELLA, CAMPYLOBACTER, YERSINIA, OR E.COLI 0157:H7 ISOLATED   Report Status 11/03/2011 FINAL   Final   SURGICAL PCR SCREEN     Status: Normal   Collection Time   10/30/11  6:48 AM      Component Value Range Status Comment   MRSA, PCR NEGATIVE  NEGATIVE Final    Staphylococcus aureus NEGATIVE  NEGATIVE Final   CULTURE, ROUTINE-ABSCESS     Status: Normal   Collection Time   10/30/11  6:27 PM      Component Value Range Status Comment   Specimen Description ABSCESS SCROTUM   Final    Special Requests NONE   Final    Gram Stain     Final    Value: NO WBC SEEN     FEW SQUAMOUS EPITHELIAL CELLS PRESENT     FEW GRAM POSITIVE COCCI IN PAIRS   Culture MODERATE ENTEROCOCCUS SPECIES   Final    Report Status 11/02/2011 FINAL   Final    Organism ID, Bacteria ENTEROCOCCUS SPECIES   Final   WOUND CULTURE     Status: Normal   Collection Time   11/01/11 12:28 PM      Component Value Range Status Comment   Specimen Description WOUND LEFT GROIN   Final     Special Requests SWABED FLUID FROM GROIN, PT ON VANCO,ZOSYN,FLAGYL   Final    Gram Stain     Final    Value: ABUNDANT WBC PRESENT, PREDOMINANTLY MONONUCLEAR     NO ORGANISMS SEEN   Culture NO GROWTH 2 DAYS   Final    Report Status 11/03/2011 FINAL   Final   ANAEROBIC CULTURE     Status: Normal (Preliminary result)   Collection Time   11/01/11 12:28 PM      Component Value Range Status Comment   Specimen Description WOUND LEFT GROIN   Final    Special Requests SWABED FLUID FROM GROIN, PT ON VANCO,ZOSYN,FLAGYL   Final    Gram Stain     Final    Value: ABUNDANT WBC PRESENT, PREDOMINANTLY MONONUCLEAR     NO ORGANISMS SEEN   Culture     Final    Value: NO ANAEROBES ISOLATED; CULTURE IN PROGRESS FOR 5 DAYS   Report Status PENDING   Incomplete   CLOSTRIDIUM DIFFICILE BY PCR     Status: Normal   Collection Time   11/04/11 12:43 AM      Component Value Range Status Comment   C difficile by pcr NEGATIVE  NEGATIVE Final      Studies: Portable Chest 1 View  10/12/2011  *RADIOLOGY REPORT*  Clinical Data: SOB, evaluate for pneumonia  PORTABLE CHEST - 1 VIEW  Comparison: 10/11/2011  Findings: Cardiomegaly with mild interstitial edema and moderate right pleural effusion.  Suspected small left pleural effusion.  Associated right lower lobe opacity, likely atelectasis, underlying pneumonia not excluded.  No pneumothorax.  Stable left IJ venous catheter.  IMPRESSION: Cardiomegaly with mild interstitial edema and moderate right pleural effusion.  Suspected small left pleural effusion.  Associate right lower lobe opacity, likely atelectasis, underlying pneumonia not excluded.  Original Report Authenticated By: Charline Bills, M.D.  Dg Abd Acute W/chest  10/11/2011  *RADIOLOGY REPORT*  Clinical Data: Abdominal pain, nausea and vomiting.  ACUTE ABDOMEN SERIES (ABDOMEN 2 VIEW & CHEST 1 VIEW)  Comparison: Abdominal radiographs 08/28/2011.  Chest x-ray 09/10/2011.  Findings: Left internal jugular PermCath with  the tips terminating in the right atrium and at the superior cavoatrial junction is unchanged.  Lung volumes are low.  Linear opacities in the left mid and lower lung are most consistent with atelectasis.  Patchy airspace consolidation and volume loss in the right middle and lower lobes.  Trace left and small right-sided pleural effusions. No pneumoperitoneum.  Supine and upright views of the abdomen demonstrate gas and stool scattered throughout the colon extending to the level of the distal rectum.  No pathologic distension of small bowel is noted.  Midline surgical staples are again noted.  Numerous vascular calcifications.  In addition, there are numerous egg-shell type calcifications in the left upper quadrant of the abdomen which appear to correspond with numerous calcified cystic lesions based on comparison with prior CT scans.  IMPRESSION: 1.  Nonobstructive bowel gas pattern. 2.  No pneumoperitoneum. 3.  Support apparatus and postoperative changes, as above. 4.  Air space consolidation and atelectasis in the right middle and lower lobes, concerning for pneumonia and/or sequelae of aspiration. 5.  Small right and trace left-sided pleural effusions. 6.  Atherosclerosis.  Original Report Authenticated By: Florencia Reasons, M.D.   Dg Foot Complete Left  10/17/2011  *RADIOLOGY REPORT*  Clinical Data: Wound infection  LEFT FOOT - COMPLETE 3+ VIEW  Comparison: None.  Findings: The patient is status post amputation of the foot beyond the proximal aspect of the five metatarsals.  No acute fracture. No dislocation.  No periosteal reaction.  No destructive bone lesion.  Vascular calcifications are prominent.  An open wound at the amputation site is noted.  IMPRESSION: Status post forefoot amputation.  Open wound is present at the amputation site.  No acute bony pathology.  Original Report Authenticated By: Donavan Burnet, M.D.    Scheduled Meds:    . alteplase  4 mg Intracatheter Once  .  ampicillin-sulbactam (UNASYN) IV  3 g Intravenous Q24H  . aspirin  81 mg Oral Daily  . darbepoetin (ARANESP) injection - DIALYSIS  100 mcg Intravenous Q Mon-HD  . feeding supplement (NEPRO CARB STEADY)  237 mL Oral BID BM  . gabapentin  200 mg Oral Daily  . heparin subcutaneous  5,000 Units Subcutaneous Q8H  . isosorbide mononitrate  30 mg Oral Daily  . lidocaine-EPINEPHrine  10 mL Intradermal Once  . loperamide  2 mg Oral Once  . multivitamin  1 tablet Oral QHS  . pantoprazole  40 mg Oral Q1200  . sevelamer  1,600 mg Oral TID WC  . simvastatin  5 mg Oral QHS  . sodium chloride  3 mL Intravenous Q12H  . DISCONTD: piperacillin-tazobactam (ZOSYN)  IV  2.25 g Intravenous Q8H   Continuous Infusions:

## 2011-11-05 NOTE — Procedures (Addendum)
I was present at this dialysis session. I have reviewed the session itself and made appropriate changes.   Vinson Moselle, MD [employer] 11/05/2011, 11:30 AM

## 2011-11-05 NOTE — Progress Notes (Signed)
Subjective:  On dialysis, BP soft.   Objective Vital signs in last 24 hours: Filed Vitals:   11/05/11 0931 11/05/11 0934 11/05/11 0953 11/05/11 1225  BP: 93/45 115/52 102/52   Pulse: 85 85 92   Temp:      TempSrc:    Oral  Resp: 18 19 19    Height:      Weight:      SpO2: 67%      Weight change: -2.7 kg (-5 lb 15.2 oz)  Intake/Output Summary (Last 24 hours) at 11/05/11 1232 Last data filed at 11/04/11 2202  Gross per 24 hour  Intake    100 ml  Output      0 ml  Net    100 ml   Labs: Basic Metabolic Panel:  Lab 11/03/11 6213 11/02/11 0701 11/01/11 1630  NA 143 139 138  K 3.5 4.3 4.3  CL 98 101 101  CO2 34* 25 25  GLUCOSE 91 114* 107*  BUN 18 41* 43*  CREATININE 3.72* 6.02* 6.33*  CALCIUM 9.3 9.3 9.4  ALB -- -- --  PHOS 5.6* 7.1* 7.0*   Liver Function Tests:  Lab 11/03/11 0941 11/02/11 0701 11/01/11 1630  AST -- -- --  ALT -- -- --  ALKPHOS -- -- --  BILITOT -- -- --  PROT -- -- --  ALBUMIN 1.6* 1.5* 1.6*   No results found for this basename: LIPASE:3,AMYLASE:3 in the last 168 hours No results found for this basename: AMMONIA:3 in the last 168 hours CBC:  Lab 11/05/11 1058 11/03/11 0940 11/02/11 0700 11/01/11 1630 10/31/11 0628 10/29/11 2027  WBC 13.8* 14.6* 15.6* -- -- --  NEUTROABS 12.1* -- -- -- 6.4 7.7  HGB 3.8* 8.0* 8.2* -- -- --  HCT 12.8* 26.2* 26.4* -- -- --  MCV 100.0 97.8 97.4 97.7 98.9 --  PLT 304 279 296 -- -- --   Cardiac Enzymes: No results found for this basename: CKTOTAL:5,CKMB:5,CKMBINDEX:5,TROPONINI:5 in the last 168 hours CBG:  Lab 11/05/11 1054 11/05/11 1046 11/03/11 2245 11/03/11 2005 11/03/11 1851  GLUCAP 102* <10* 109* 116* 126*    Iron Studies:  No results found for this basename: IRON,TIBC,TRANSFERRIN,FERRITIN in the last 72 hours Studies/Results: Dg Abd 1 View  11/04/2011  *RADIOLOGY REPORT*  Clinical Data: Abdominal tenderness  ABDOMEN - 1 VIEW  Comparison: Abdomen film of 11/03/2011 and CT abdomen pelvis of 10/31/2011   Findings: No present bowel obstruction is seen.  Both large and small bowel gas is present without distention.  Calcifications in the left upper quadrant are again noted as previously described.  IMPRESSION: No evidence of bowel obstruction.  Original Report Authenticated By: Juline Patch, M.D.   Dg Chest Port 1 View  11/05/2011  *RADIOLOGY REPORT*  Clinical Data: Acute desaturation with shortness of breath.  PORTABLE CHEST - 1 VIEW  Comparison: 10/12/2011.  Findings: Left-sided internal jugular PermCath with tips terminating at the superior cavoatrial junction and in the right atrium.  There is an unusual lucency in the lateral aspect of the left hemithorax, which is favored to represent a skin fold. However, there is also a lucency at the base of the left hemithorax as such that a small left-sided pneumothorax is difficult to exclude.  The moderate right-sided pleural effusion, slightly decreased compared to prior examination.  Improving aeration at the left base with resolving atelectasis.  No evidence of pulmonary edema.  Interstitial prominence and ill-defined opacities in the right perihilar and infrahilar region likely reflects some passive atelectasis (  underlying airspace consolidation is difficult to entirely exclude).  The heart size is normal.  Mediastinal contours are unremarkable.  Atherosclerosis of the thoracic aorta.  IMPRESSION: 1.  New lucency in the lateral left hemithorax and at the left base.  Overall, the findings are favored to reflecting artifact related to a skin fold, however, underlying pneumothorax is difficult to entirely exclude.  A repeat study (preferably with a standing PA and lateral if the patient is able) is recommended at this time. 2.  Overall, there has been improving aeration compared to the prior examination from 10/12/2011, however, there continues to be a moderate right-sided pleural effusion with atelectasis and/or consolidation in the right lower lobe. 3.   Atherosclerosis.  These results were called by telephone on 11/05/2011 at 11:07 to nurse Verdon Cummins at [phone], who verbally acknowledged these results.  Original Report Authenticated By: Florencia Reasons, M.D.   Medications: Infusions:    Scheduled Medications:    . alteplase  4 mg Intracatheter Once  . ampicillin-sulbactam (UNASYN) IV  3 g Intravenous Q24H  . aspirin  81 mg Oral Daily  . darbepoetin (ARANESP) injection - DIALYSIS  100 mcg Intravenous Q Mon-HD  . famotidine (PEPCID) IV  20 mg Intravenous Q24H  . feeding supplement (NEPRO CARB STEADY)  237 mL Oral BID BM  . gabapentin  200 mg Oral Daily  . isosorbide mononitrate  30 mg Oral Daily  . lidocaine-EPINEPHrine  10 mL Intradermal Once  . loperamide  2 mg Oral Once  . multivitamin  1 tablet Oral QHS  . sevelamer  1,600 mg Oral TID WC  . simvastatin  5 mg Oral QHS  . sodium chloride  3 mL Intravenous Q12H  . DISCONTD: heparin subcutaneous  5,000 Units Subcutaneous Q8H  . DISCONTD: pantoprazole  40 mg Oral Q1200  . DISCONTD: piperacillin-tazobactam (ZOSYN)  IV  2.25 g Intravenous Q8H    have reviewed scheduled and prn medications.  Physical Exam: General: chronically ill appearing in air bed.  NAD Heart: RRR Lungs: anteriorly clear Abdomen: soft, nontender Extremities: Left groin wound with VAC in place, L TMA with VAC in place. No LE edema  Dialysis Access: L sided chest catheter.    Summary Pt is a 61 y.o. yo male with ESRD who was admitted on 10/29/2011 with  Poorly healing left transmet wound and continued drainage of left groin wound.    Assessment/Plan:  1. 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   2. ESRD- [facility] MWF via PC.  Stable volume, no excess. HD today. Will back off on UF since BP's are soft. Give bolus prn.  3. Anemia- due to CKD and blood loss,  trending down.   Giving aranesp, no iron due to high ferritin  4. Secondary hyperparathyroidism- phos 7.1, calc 9.3, adj Ca 11.3. PTH 278, not on vit D or any Ca products. High Ca due to immobilization most likely. Renvela started.  5. HTN/volume- variable but mostly reasonable.  No BP meds for now. 6. DM-hypoglycemia- per primary team.  7. Dispo- possible LTAC placement   Vinson Moselle  MD [facility] [phone] pgr    [phone] cell 11/05/2011, 12:32 PM

## 2011-11-05 NOTE — Consult Note (Signed)
Name: [name] MRN: [id number] DOB: 01/28/1951    LOS: 7  Referring Provider:  [facility] (short) Reason for Referral:  Hypotension, confusion   PULMONARY / CRITICAL CARE MEDICINE  HPI:  61yo male with multiple medical problems including ESRD on HD MWF, diastolic CHF, HTN, significant PVD s/p recent L foot amputation with wound VAC placement for non healing previous toe amputation sites this month, L fem-pop in June with non healing site, recent admission in late May with septic shock r/t CDiff colitis.  He presented 8/8 with c/o continued drainage in L groin and L scrotal abscess.  He was admitted by [facility] with ?recurrent CDiff colitis, scrotal abscess and vascular surgery complications.  On 8/14 he developed hypotension with SBP ~60's in HD, confusion and black liquid stools and PCCM called.    Past Medical History  Diagnosis Date  . Hyperlipidemia   . ESRD (end stage renal disease)     a. MWF dialysis in [place] (followed by Dr. Kristian Covey)  . Arthritis   . Claudication   . Stroke 2005  . Anemia   . Chronic diastolic CHF (congestive heart failure) 07/2010    a. 07/2008 Echo EF 50-55%, mild-mod LVH, Gr 1 DD.  Marland Kitchen Peripheral vascular occlusive disease     a. 07/2011 Periph Angio: No signif Ao-illiac dzs, LCF stenosis, 100% LSFA w recon above knee pop and 3 vessel runoff, 100% RSFA w/ recon above knee --> pending L Fem to below Knee pop bypass.  Marland Kitchen History of tobacco abuse   . Hypertension     takes Amlodipine,Metoprolol,and Prinivil daily  . Dyspnea on exertion     with exertion  . GERD (gastroesophageal reflux disease)   . Cataract     bilateral  . Dialysis patient    Past Surgical History  Procedure Date  . Arteriovenous graft placement   . Av fistula placement 12/10/2000    Right brachiocephalic arteriovenous   . Hernia repair   . Aortagram 07/29/2011    Abdominal Aortagram  . Cardiac catheterization 08/01/11    Left heart catheterization  . Femoral-popliteal bypass graft  09/09/2011    Procedure: BYPASS GRAFT FEMORAL-POPLITEAL ARTERY;  Surgeon: [name], MD;  Location: [facility] OR;  Service: Vascular;  Laterality: Left;  . Amputation 09/12/2011    Procedure: AMPUTATION BELOW KNEE;  Surgeon: [name], MD;  Location: [facility] OR;  Service: Vascular;  Laterality: Left;  TRANSMETATARSAL  . Application of wound vac 10/30/2011    Procedure: APPLICATION OF WOUND VAC;  Surgeon: [name], MD;  Location[facility] OR;  Service: Vascular;  Laterality: Left;  . Groin debridement 11/01/2011    Procedure: Drucie Ip DEBRIDEMENT;  Surgeon: Chuck Hint, MD;  Location: [facility] OR;  Service: Vascular;  Laterality: Left;   Prior to Admission medications   Medication Sig Start Date End Date Taking? Authorizing Provider  cinacalcet (SENSIPAR) 30 MG tablet Take 2 tablets (60 mg total) by mouth daily with breakfast. 09/16/11 12/15/11 Yes Lars Mage, PA  gabapentin (NEURONTIN) 100 MG capsule Take 2 capsules (200 mg total) by mouth daily. 09/26/11  Yes Evlyn Kanner Love, PA  isosorbide mononitrate (IMDUR) 30 MG 24 hr tablet Take 30 mg by mouth daily.   Yes Historical Provider, MD  multivitamin (RENA-VIT) TABS tablet Take 1 tablet by mouth daily.   Yes Historical Provider, MD  simvastatin (ZOCOR) 5 MG tablet Take 5 mg by mouth at bedtime.   Yes Historical Provider, MD   Allergies No Known Allergies  Family History Family History  Problem Relation Age of Onset  . Breast cancer Mother   . Cancer Father   . Anesthesia problems Neg Hx   . Hypotension Neg Hx   . Malignant hyperthermia Neg Hx   . Pseudochol deficiency Neg Hx    Social History  reports that he quit smoking about 7 months ago. His smoking use included Cigarettes. He has a 40 pack-year smoking history. He does not have any smokeless tobacco history on file. He reports that he does not drink alcohol or use illicit drugs.  Review Of Systems:  Difficult - pt somewhat confused.  He denies abd pain, n/v, headache, SOB, chest pain.   All other systems reviewed and were neg.    Vital Signs: Temp:  [97.6 F (36.4 C)-99.1 F (37.3 C)] 97.6 F (36.4 C) (08/14 0645) Pulse Rate:  [63-108] 94  (08/14 0927) Resp:  [10-22] 22  (08/14 0927) BP: (63-125)/(23-63) 91/28 mmHg (08/14 0927) SpO2:  [93 %-99 %] 93 % (08/14 0645) Weight:  [103 lb 6.3 oz (46.9 kg)-104 lb 4.4 oz (47.3 kg)] 104 lb 4.4 oz (47.3 kg) (08/14 0645)  Physical Examination: General:  Chronically ill appearing male, NAD Neuro:  Awake, alert, slightly confused at times, MAE, gen weakness HEENT:  Mm pale, dry, no JVD Cardiovascular:  s1s2 rrr Lungs:  resps even non labored on [place], diminished bases, otherwise CTA Abdomen:  Soft, non tender Ext: L foot amputation with VAC, L groin wound dressed  Principal Problem:  *Scrotal abscess Active Problems:  CVA  CKD (chronic kidney disease) stage V requiring chronic dialysis  Hypertension  Peripheral vascular disease, unspecified  C. difficile colitis- recalcitrant  ESRD on hemodialysis  Nausea and vomiting in adult  Non-healing surgical wound  Diarrhea  Hypoglycemia  Lymphocele  Infection due to enterococcus  Hypotension  Hypoxia   ASSESSMENT AND PLAN  PULMONARY  Lab 11/05/11 1100  PHART 7.559*  PCO2ART 29.8*  PO2ART 378.0*  HCO3 26.6*  O2SAT --   CXR:  8/14>> improved aeration.   ETT:    A:  Hypoxia - resolved P:   Pulmonary hygiene  F/u CXR  O2 as needed   CARDIOVASCULAR No results found for this basename: TROPONINI:5,LATICACIDVEN:5, O2SATVEN:5,PROBNP:5 in the last 168 hours ECG:  NSR Lines: none  A: hypotension  - ? R/t possible GI bleeding vs sepsis in setting abscesses.  Improved after 2L in HD.  ??accuracy of BP in vasculopath. SBP labile.  Diastolic CHF  P:  Hold further volume with CHF and ESRD  Check CBC Check lactate, pct   RENAL  Lab 11/03/11 0941 11/02/11 0701 11/01/11 1630 11/01/11 1051 10/31/11 0628 10/30/11 1200  NA 143 139 138 140 141 --  K 3.5 4.3 -- -- -- --    CL 98 101 101 -- 102 103  CO2 34* 25 25 -- 28 26  BUN 18 41* 43* -- 35* 26*  CREATININE 3.72* 6.02* 6.33* -- 5.05* 4.20*  CALCIUM 9.3 9.3 9.4 -- 9.0 9.0  MG -- -- -- -- -- --  PHOS 5.6* 7.1* 7.0* -- -- --   Intake/Output      08/13 0701 - 08/14 0700 08/14 0701 - 08/15 0700   P.O. 240    I.V. (mL/kg) 3 (0.1)    IV Piggyback 100    Total Intake(mL/kg) 343 (7.3)    Other     Total Output     Net +343         Stool Occurrence  2 x     Foley:  none  A: ESRD P:   HD per renal    GASTROINTESTINAL  Lab 11/03/11 0941 11/02/11 0701 11/01/11 1630  AST -- -- --  ALT -- -- --  ALKPHOS -- -- --  BILITOT -- -- --  PROT -- -- --  ALBUMIN 1.6* 1.5* 1.6*    A:  ??melena Hx CDiff - neg this admit  P:   Stat CBC  guiac stool  Consider GI input   HEMATOLOGIC  Lab 11/03/11 0940 11/02/11 0700 11/01/11 1630 11/01/11 1051 10/31/11 0628 10/30/11 1200  HGB 8.0* 8.2* 7.9* 9.9* 8.3* --  HCT 26.2* 26.4* 25.5* 29.0* 26.6* --  PLT 279 296 244 -- 223 193  INR -- -- -- -- -- --  APTT -- -- -- -- -- --   A:  Anemia - ??chronic disease v acute blood loss with ? GI bleeding P:  Stat CBC  Transfuse as needed  aranesp per renal   INFECTIOUS  Lab 11/03/11 0940 11/02/11 0700 11/01/11 1630 11/01/11 1555 10/31/11 0628 10/30/11 1200  WBC 14.6* 15.6* 12.1* -- 8.2 9.0  PROCALCITON -- -- -- 1.43 -- --   Cultures: 8/13 CDiff>>>neg 8/7 stool>>> neg 8/14 BCx2 >>> 8/10 L groin wound>>> neg  8/8 crotal abscess>>> enterococcus   Antibiotics: Unasyn 8/13>>>  A:  Wound infection - enterococcus  Recent hx Cdiff - negative this admit   P:   Cont unasyn  Check pct  Blood cultures   ENDOCRINE  Lab 11/05/11 1054 11/05/11 1046 11/03/11 2245 11/03/11 2005 11/03/11 1851  GLUCAP 102* <10* 109* 116* 126*   A:  DM P:   Hold SSI with mult episodes hypoglycemia   NEUROLOGIC  A: AMS/ confusion - mild.  ?? R/t hypotension.  Improving P:   Supportive care  D/c sedating meds    BEST  PRACTICE / DISPOSITION Level of Care:  ICU Primary Service:  PCCM -- tx back to [facility] when stable and out of ICU Consultants:  Renal, VVS, urology Code Status:  Full  Diet:  NPO DVT Px:  SCD's GI Px:  pepcid  Skin Integrity:  Poor - mult ulcers/abscesses Social / Family:  None available   Danford Bad, NP Pulmonary and Critical Care Medicine [facility] Pager: [phone]  12:01 PM 11/05/2011  GI bleeding with hypotension, on unasyn for enterobacter from wounds.  Will call GI and transfuse, renal to follow.  CC time 45 min.  Patient seen and examined, agree with above note.  I dictated the care and orders written for this patient under my direction.  Koren Bound, M.D. [phone]

## 2011-11-05 NOTE — Progress Notes (Addendum)
Asked to assist with patient with hypotension and decreased O2 sats while in HD now transferred back to tele floor.  On arrival patient supine in bed - skin cool dry - alert but weak - states he feels tired - SR on monitor - RR 18-22 No distress.  Bil BS present with some wheezing noted - mucus membranes very pale - rectal tube present with very dark black liquid stool present - RN states stool was brown yesterday. VAC dsg patent to foot.  Patient is ESRD with dialysis - has bil non-functioning grafts present - staff reports very difficult to obtain any type of consistent BP - very strong pulse noted brachial and radial - doppled strong pulses - difficult to even ascertain doppled pressure with vascular issues.  On NRB mask - unable to obtain good pleth for O2 sat reading - stat ABG drawn.  Dr. Malachi Bonds present - stat labs and [facility] ordered.  CBG 107.   Patient remains alert - denies pain - just states he is tired.  Oriented to self and time.  Dr. Molli Knock and Marc Morgans NP with CCM present.  Patient to transfer to ICU.  Aware of assessment.  Labs drawn. ABG results given to Dr. Malachi Bonds - patient placed on 4 L n/c - NRB d/c'd.  12 Lead EKG done.

## 2011-11-05 NOTE — Consult Note (Addendum)
Wound care follow-up:  Vac dressing changed last night by bedside nurse.  Will change vac schedule to Thursday and plan to reassess all wounds tomorrow.   Cammie Mcgee, RN, MSN, [employer]  [phone]

## 2011-11-05 NOTE — Progress Notes (Signed)
[facility] Progress Note  11/05/2011 Late entry. 7:54 AM POD 4 (pt seen 8/13)  Subjective:  No complaints;  Feels better since VAC suction decreased.  Tm 99.1 now afebrile Filed Vitals:   11/05/11 0731  BP: 110/57  Pulse: 98  Temp:   Resp: 12    Physical Exam: Incisions:  Wound VACs in place left groin and left foot.   CBC    Component Value Date/Time   WBC 14.6* 11/03/2011 0940   RBC 2.68* 11/03/2011 0940   HGB 8.0* 11/03/2011 0940   HCT 26.2* 11/03/2011 0940   PLT 279 11/03/2011 0940   MCV 97.8 11/03/2011 0940   MCH 29.9 11/03/2011 0940   MCHC 30.5 11/03/2011 0940   RDW 18.6* 11/03/2011 0940   LYMPHSABS 1.2 10/31/2011 0628   MONOABS 0.6 10/31/2011 0628   EOSABS 0.0 10/31/2011 0628   BASOSABS 0.1 10/31/2011 0628    BMET    Component Value Date/Time   NA 143 11/03/2011 0941   K 3.5 11/03/2011 0941   CL 98 11/03/2011 0941   CO2 34* 11/03/2011 0941   GLUCOSE 91 11/03/2011 0941   BUN 18 11/03/2011 0941   CREATININE 3.72* 11/03/2011 0941   CALCIUM 9.3 11/03/2011 0941   GFRNONAA 16* 11/03/2011 0941   GFRAA 19* 11/03/2011 0941    INR    Component Value Date/Time   INR 1.30 09/12/2011 0410     Intake/Output Summary (Last 24 hours) at 11/05/11 0754 Last data filed at 11/04/11 2202  Gross per 24 hour  Intake    343 ml  Output      0 ml  Net    343 ml     Assessment/Plan:  61 y.o. male is s/p  #1.) Revision of left transmetatarsal amputation #2.) Placement of wound VAC  POD 5  1. Exploration of left groin and repair of lymphocele  2. Placement of VAC POD 4  -VAC's off today-WOC to page Dr. Severiano Gilbert when changed.  Doreatha Massed, PA-C [facility] [phone] 11/05/2011 7:54 AM

## 2011-11-05 NOTE — Consult Note (Signed)
[facility] Consult: 4:38 PM 11/05/2011   Referring Provider: CCM Primary Care Physician:  Toma Deiters, MD Primary Gastroenterologist: 2010 screening colonoscopy, Dr Arlyce Dice   Reason for Consultation:  GI bleed  HPI: [name] is a 61 y.o. male.  We are asked to evaluate Hgb drop of 8.0 to 3.8 in 2 days along with tarry stools in the flexiseal catheter.  Extensive med-surg problems in this pt with ESRD and ASPVD including foot amputation, LE bypass, non-healing wounds requiring new surgeries to groin and foot.  Tested C Diff positive May (discharged on Metronidazole) and June 2013 (did not require abx for this at discharge).  Never seen by GI during recent admissions.   This admission began 10/29/11 for wound complications of groin and left LE.  He has recurrent watery diarrhea while holding in ED, though at home he reports he had formed stool.  Stool is C diff negative.  Loose stool prompted placement of Flexiseal rectal catheter.  He had nausea without emesis on Sunday, 4 days ago, and briefly this AM.  While in dialysis today, he became confused and hypotensive (nadir 54/36), pulse in 90s.  Black liquid stools seen in flexiseal . He has regained his mental status and is not currently confused. Hgb is 3.8, compared to 8.0 two days ago. Historic Hgb in last few months is 8.0 to 9.0. Pt is 17.4, INR is 1.4. He had been receiving SQ heparin tid (until last dose at 0530 today) along with heparin with dialysis.   Pt ate packet of nabs this afternoon despite advisement to stay NPO.  Incidental note is made of fluid distending the esophagus on CT of 10/31/11, rule out dysmotility vs reflux.  Pt complains of recurrence of frequent acid reflux in last several weeks, treated with PRN Zantac.  He also describes liquid and solid dysphagia, taking several attempts at swallowing to pass ingested food and liquid. He is not choking or gagging on POs.  Stool at home was not black .       Past Medical History  Diagnosis Date  . Hyperlipidemia   . ESRD (end stage renal disease)     a. MWF dialysis in [place] (followed by Dr. Kristian Covey)  . Arthritis   . Claudication   . Stroke 2005  . Anemia   . Chronic diastolic CHF (congestive heart failure) 07/2010    a. 07/2008 Echo EF 50-55%, mild-mod LVH, Gr 1 DD.  Marland Kitchen Peripheral vascular occlusive disease     a. 07/2011 Periph Angio: No signif Ao-illiac dzs, LCF stenosis, 100% LSFA w recon above knee pop and 3 vessel runoff, 100% RSFA w/ recon above knee --> pending L Fem to below Knee pop bypass.  Marland Kitchen History of tobacco abuse   . Hypertension     takes Amlodipine,Metoprolol,and Prinivil daily  . Dyspnea on exertion     with exertion  . GERD (gastroesophageal reflux disease)   . Cataract     bilateral  . Dialysis patient   . PUD (peptic ulcer disease) mid 1990's    with GI bleed.  endoscoped in mid 1990's at [facility]    Past Surgical History  Procedure Date  . Arteriovenous graft placement   . Av fistula placement 12/10/2000    Right brachiocephalic arteriovenous   . Hernia repair   . Aortagram 07/29/2011    Abdominal Aortagram  . Cardiac catheterization 08/01/11    Left heart catheterization  . Femoral-popliteal bypass graft 09/09/2011    Procedure: BYPASS GRAFT  FEMORAL-POPLITEAL ARTERY;  Surgeon: [name], MD;  Location: [facility] OR;  Service: Vascular;  Laterality: Left;  . Amputation 09/12/2011    Procedure: AMPUTATION BELOW KNEE;  Surgeon: [name], MD;  Location: [facility] OR;  Service: Vascular;  Laterality: Left;  TRANSMETATARSAL  . Application of wound vac 10/30/2011    Procedure: APPLICATION OF WOUND VAC;  Surgeon: [name], MD;  Location: [facility] OR;  Service: Vascular;  Laterality: Left;  . Groin debridement 11/01/2011    Procedure: Drucie Ip DEBRIDEMENT;  Surgeon: Chuck Hint, MD;  Location: [facility] OR;  Service: Vascular;  Laterality: Left;    Prior to Admission  medications   Medication Sig Start Date End Date Taking? Authorizing Provider  cinacalcet (SENSIPAR) 30 MG tablet Take 2 tablets (60 mg total) by mouth daily with breakfast. 09/16/11 12/15/11 Yes Lars Mage, PA  gabapentin (NEURONTIN) 100 MG capsule Take 2 capsules (200 mg total) by mouth daily. 09/26/11  Yes Evlyn Kanner Love, PA  isosorbide mononitrate (IMDUR) 30 MG 24 hr tablet Take 30 mg by mouth daily.   Yes Historical Provider, MD  multivitamin (RENA-VIT) TABS tablet Take 1 tablet by mouth daily.   Yes Historical Provider, MD  simvastatin (ZOCOR) 5 MG tablet Take 5 mg by mouth at bedtime.   Yes Historical Provider, MD    Scheduled Meds:    . alteplase  4 mg Intracatheter Once  . ampicillin-sulbactam (UNASYN) IV  3 g Intravenous Q24H  . aspirin  81 mg Oral Daily  . darbepoetin (ARANESP) injection - DIALYSIS  100 mcg Intravenous Q Mon-HD  . famotidine (PEPCID) IV  20 mg Intravenous Q24H  . feeding supplement (NEPRO CARB STEADY)  237 mL Oral BID BM  . gabapentin  200 mg Oral Daily  . isosorbide mononitrate  30 mg Oral Daily  . lidocaine-EPINEPHrine  10 mL Intradermal Once  . loperamide  2 mg Oral Once  . multivitamin  1 tablet Oral QHS  . sevelamer  1,600 mg Oral TID WC  . simvastatin  5 mg Oral QHS  . sodium chloride  3 mL Intravenous Q12H   Infusions:    . pantoprozole (PROTONIX) infusion     PRN Meds: calcium carbonate, heparin, heparin, ondansetron (ZOFRAN) IV, ondansetron, promethazine,   Allergies as of 10/29/2011  . (No Known Allergies)    Family History  Problem Relation Age of Onset  . Breast cancer Mother   . Cancer Father   . Anesthesia problems Neg Hx   . Hypotension Neg Hx   . Malignant hyperthermia Neg Hx   . Pseudochol deficiency Neg Hx     History   Social History  . Marital Status: Legally Separated    Spouse Name: N/A    Number of Children: N/A  . Years of Education: N/A   Occupational History  . DISABLED    Social History Main Topics  .  Smoking status: Former Smoker -- 1.0 packs/day for 40 years    Types: Cigarettes    Quit date: 03/25/2011  . Smokeless tobacco: Not on file  . Alcohol Use: No  . Drug Use: No  . Sexually Active: Not Currently   Other Topics Concern  . Not on file   Social History Narrative   Lives in [place].  His cousin helps him out and drives him to appts. He quit smoking earlier this year.    REVIEW OF SYSTEMS: Weight down about 20 # since original surgeries in spring 2013. No rash or itching  Serosanguinous exudate from groin, foot wounds and sacral decubitus No rash or itching.  Sacral wound is painful.  Not using NSAIDs except 81 ASA at home. Wheelchair mobility at home. Swelling in feet/legs and UE near time of current admission, this has improved. Anuric.  No scrotal edema.  Smokes 1/2 ppd and lives with an indoor smoker.  No hx liver dz or excessive etoh.  Prior blood transfusion, none this year.    PHYSICAL EXAM: Vital signs in last 24 hours: Temp:  [97.6 F (36.4 C)-99.1 F (37.3 C)] 97.6 F (36.4 C) (08/14 0645) Pulse Rate:  [63-108] 92  (08/14 0953) Resp:  [10-22] 19  (08/14 0953) BP: (54-125)/(28-63) 102/52 mmHg (08/14 0953) SpO2:  [67 %-99 %] 67 % (08/14 0931) Weight:  [103 lb 6.3 oz (46.9 kg)-104 lb 4.4 oz (47.3 kg)] 104 lb 4.4 oz (47.3 kg) (08/14 0645)  General: looks thin and chronically ill but not acutely toxic or ill looking Head:  Temporal wasting. No facial edema.    Eyes:  No icterus or pallor.  EOMI Ears:  Not HOH  Nose:  No discharge or congestion Mouth:  No sores. No teeth Neck:  No mass.  Dialysis catheter at left neck Lungs:  Clear B.  No cough or SOB Heart: RRR, no MRG Abdomen:  Soft, NT, ND, no mass or HSM.  Active BS.   Rectal: black , liquid stool in Flexiseal bag .  Sacral decub is covered with bandage  Musc/Skeltl: amputated left forfoot with attached wound vac Extremities:  No pedal edema.  Slight, non-pitting edema at left UE.  Fistula on right  arm  Neurologic:  Not confused, good historian, no tremor.  Moves all 4 limbs easily.  Strength not tested Skin:  Residual ulcer at posterior/dorsal scrotum Tattoos:  none   Psych:  Pleasant, not depressed or agitated.    LAB RESULTS:  CBC   Ref. Range 11/01/2011 10:51 11/01/2011 16:30 11/02/2011 07:00 11/03/2011 09:40 11/05/2011 10:58  WBC Latest Range: 4.0-10.5 K/uL  12.1 (H) 15.6 (H) 14.6 (H) 13.8 (H)  RBC Latest Range: 4.22-5.81 MIL/uL  2.61 (L) 2.71 (L) 2.68 (L) 1.28 (L)  Hemoglobin Latest Range: 13.0-17.0 g/dL 9.9 (L) 7.9 (L) 8.2 (L) 8.0 (L) 3.8 (LL)  HCT Latest Range: 39.0-52.0 % 29.0 (L) 25.5 (L) 26.4 (L) 26.2 (L) 12.8 (L)  MCV Latest Range: 78.0-100.0 fL  97.7 97.4 97.8 100.0  MCH Latest Range: 26.0-34.0 pg  30.3 30.3 29.9 29.7  MCHC Latest Range: 30.0-36.0 g/dL  16.1 09.6 04.5 40.9 (L)  RDW Latest Range: 11.5-15.5 %  19.5 (H) 19.4 (H) 18.6 (H) 18.5 (H)  Platelets Latest Range: 150-400 K/uL  244 296 279 304     BMET Lab Results  Component Value Date   NA 140 11/05/2011   NA 143 11/03/2011   NA 139 11/02/2011   K 4.1 11/05/2011   K 3.5 11/03/2011   K 4.3 11/02/2011   CL 100 11/05/2011   CL 98 11/03/2011   CL 101 11/02/2011   CO2 25 11/05/2011   CO2 34* 11/03/2011   CO2 25 11/02/2011   GLUCOSE 60* 11/05/2011   GLUCOSE 91 11/03/2011   GLUCOSE 114* 11/02/2011   BUN 36* 11/05/2011   BUN 18 11/03/2011   BUN 41* 11/02/2011   CREATININE 2.27* 11/05/2011   CREATININE 3.72* 11/03/2011   CREATININE 6.02* 11/02/2011   CALCIUM 8.3* 11/05/2011   CALCIUM 9.3 11/03/2011   CALCIUM 9.3 11/02/2011   LFT  Basename 11/05/11 1058 11/03/11  0941  PROT 5.0* --  ALBUMIN 1.5* 1.6*  AST 22 --  ALT 8 --  ALKPHOS 79 --  BILITOT 0.2* --  BILIDIR -- --  IBILI -- --   PT/INR Lab Results  Component Value Date   INR 1.40 11/05/2011   INR 1.30 09/12/2011   INR 1.38 08/27/2011       PT                           17.4                                                                 11/05/11  C-Diff  Negative  on 11/04/11  RADIOLOGY STUDIES: Dg Abd 1 View 11/04/2011  *RADIOLOGY REPORT*  Clinical Data: Abdominal tenderness  ABDOMEN - 1 VIEW  Comparison: Abdomen film of 11/03/2011 and CT abdomen pelvis of 10/31/2011  Findings: No present bowel obstruction is seen.  Both large and small bowel gas is present without distention.  Calcifications in the left upper quadrant are again noted as previously described.  IMPRESSION: No evidence of bowel obstruction.  Original Report Authenticated By: Juline Patch, M.D.   Dg Chest Port 1 View 11/05/2011  .   IMPRESSION: 1.  New lucency in the lateral left hemithorax and at the left base.  Overall, the findings are favored to reflecting artifact related to a skin fold, however, underlying pneumothorax is difficult to entirely exclude.  A repeat study (preferably with a standing PA and lateral if the patient is able) is recommended at this time. 2.  Overall, there has been improving aeration compared to the prior examination from 10/12/2011, however, there continues to be a moderate right-sided pleural effusion with atelectasis and/or consolidation in the right lower lobe. 3.  Atherosclerosis.  These results were called by telephone on 11/05/2011 at 11:07 to nurse Verdon Cummins at [phone], who verbally acknowledged these results.  Original Report Authenticated By: Florencia Reasons, M.D.    ENDOSCOPIC STUDIES: Colonoscopy    08/2008  Arlyce Dice MD Screening study to IC valve.  ENDOSCOPIC IMPRESSION:  1) Diverticula, scattered in the ascending colon  2) Mild diverticulosis in the sigmoid colon  3) Otherwise normal examination  RECOMMENDATIONS:  1) Continue current colorectal screening recommendations for "routine risk" patients with a repeat colonoscopy in 10 years.  REPEAT EXAM: In 10 year(s) for Colonoscopy.   IMPRESSION: *  GI Bleed.  Rule out PUD, neoplasia, gastritis.   *  Bleeding PUD in mid 1990's.  *  ABL anemia, on top of post surgical and anemia of chronic disease.  *   Hypotension *  Solid/liquid dysphagia.  Fluid noted in distended esophagus on CT scan.  *  ESRD.  Dialysis session forshortened today due to low BP *  Revision left tranmetatarsal amputation with wound vac placement 10/30/11, previous left trans metatarsal amputation 09/12/11 *  Peripheral vascular disease. L fem-pop and femoral endarterectomy 09/09/11,  subsequent 11/01/11 left groin exploration, repair lymphocele  *  Drainage scrotal abcess 10/30/11 *  c diff colitis with sepsis 07/2011 *  Sacral decubitus  PLAN: *  Start PPI drip, will need another peripheral IV for this.  *  Transfuse to get Hgb > 8.0 *  EGD  set for 8:30 AM on 8/15.  Can have clears tonight.     LOS: 7 days   Jennye Moccasin  11/05/2011, 4:38 PM Pager: [phone]

## 2011-11-06 ENCOUNTER — Encounter (HOSPITAL_COMMUNITY)
Admission: EM | Disposition: A | Discharge status: Nursing Home (Includes ICF) | Payer: Self-pay | Source: Home / Self Care | Attending: Internal Medicine

## 2011-11-06 ENCOUNTER — Encounter (HOSPITAL_COMMUNITY): Payer: Self-pay

## 2011-11-06 DIAGNOSIS — K2970 Gastritis, unspecified, without bleeding: Secondary | ICD-10-CM

## 2011-11-06 DIAGNOSIS — K2990 Gastroduodenitis, unspecified, without bleeding: Secondary | ICD-10-CM

## 2011-11-06 DIAGNOSIS — Z87891 Personal history of nicotine dependence: Secondary | ICD-10-CM

## 2011-11-06 DIAGNOSIS — K5520 Angiodysplasia of colon without hemorrhage: Secondary | ICD-10-CM

## 2011-11-06 HISTORY — PX: ESOPHAGOGASTRODUODENOSCOPY: SHX5428

## 2011-11-06 LAB — ANAEROBIC CULTURE

## 2011-11-06 LAB — CBC
HCT: 26.2 % — ABNORMAL LOW (ref 39.0–52.0)
Hemoglobin: 7.4 g/dL — ABNORMAL LOW (ref 13.0–17.0)
Hemoglobin: 9.3 g/dL — ABNORMAL LOW (ref 13.0–17.0)
MCH: 30.5 pg (ref 26.0–34.0)
MCH: 32.3 pg (ref 26.0–34.0)
MCHC: 34.1 g/dL (ref 30.0–36.0)
MCHC: 35.5 g/dL (ref 30.0–36.0)
MCHC: 33.2 g/dL (ref 30.0–36.0)
MCV: 91 fL (ref 78.0–100.0)
Platelets: 218 10*3/uL (ref 150–400)
Platelets: 236 10*3/uL (ref 150–400)
RBC: 2.43 MIL/uL — ABNORMAL LOW (ref 4.22–5.81)
RBC: 2.88 MIL/uL — ABNORMAL LOW (ref 4.22–5.81)
RDW: 17 % — ABNORMAL HIGH (ref 11.5–15.5)

## 2011-11-06 LAB — BASIC METABOLIC PANEL
Calcium: 8.4 mg/dL (ref 8.4–10.5)
GFR calc Af Amer: 28 mL/min — ABNORMAL LOW (ref >90)
GFR calc non Af Amer: 24 mL/min — ABNORMAL LOW (ref >90)
Glucose, Bld: 76 mg/dL (ref 70–99)
Potassium: 3.3 mEq/L — ABNORMAL LOW (ref 3.5–5.1)
Sodium: 140 mEq/L (ref 135–145)

## 2011-11-06 SURGERY — EGD (ESOPHAGOGASTRODUODENOSCOPY)
Anesthesia: Moderate Sedation

## 2011-11-06 MED ORDER — DIPHENHYDRAMINE HCL 50 MG/ML IJ SOLN
INTRAMUSCULAR | Status: AC
  Filled 2011-11-06: qty 1

## 2011-11-06 MED ORDER — MIDAZOLAM HCL 10 MG/2ML IJ SOLN
INTRAMUSCULAR | Status: DC | PRN
  Administered 2011-11-06 (×3): 2 mg via INTRAVENOUS
  Administered 2011-11-06: 1 mg via INTRAVENOUS
  Administered 2011-11-06: 2 mg via INTRAVENOUS

## 2011-11-06 MED ORDER — POTASSIUM CHLORIDE 10 MEQ/100ML IV SOLN
10.0000 meq | INTRAVENOUS | Status: AC
  Administered 2011-11-06 (×2): 10 meq via INTRAVENOUS
  Filled 2011-11-06 (×2): qty 100

## 2011-11-06 MED ORDER — FENTANYL CITRATE 0.05 MG/ML IJ SOLN
INTRAMUSCULAR | Status: AC
  Filled 2011-11-06: qty 2

## 2011-11-06 MED ORDER — FENTANYL CITRATE 0.05 MG/ML IJ SOLN
INTRAMUSCULAR | Status: DC | PRN
  Administered 2011-11-06 (×4): 25 ug via INTRAVENOUS

## 2011-11-06 MED ORDER — DIPHENHYDRAMINE HCL 50 MG/ML IJ SOLN
INTRAMUSCULAR | Status: DC | PRN
  Administered 2011-11-06: 25 mg via INTRAVENOUS

## 2011-11-06 MED ORDER — PANTOPRAZOLE SODIUM 40 MG IV SOLR
40.0000 mg | Freq: Two times a day (BID) | INTRAVENOUS | Status: DC
  Administered 2011-11-06: 40 mg via INTRAVENOUS
  Filled 2011-11-06 (×4): qty 40

## 2011-11-06 MED ORDER — SODIUM CHLORIDE 0.9 % IV SOLN
INTRAVENOUS | Status: DC
  Administered 2011-11-06: 20:00:00 via INTRAVENOUS

## 2011-11-06 MED ORDER — BUTAMBEN-TETRACAINE-BENZOCAINE 2-2-14 % EX AERO
INHALATION_SPRAY | CUTANEOUS | Status: DC | PRN
  Administered 2011-11-06: 2 via TOPICAL

## 2011-11-06 MED ORDER — OXYCODONE-ACETAMINOPHEN 5-325 MG PO TABS
1.0000 | ORAL_TABLET | Freq: Four times a day (QID) | ORAL | Status: DC | PRN
  Administered 2011-11-06 (×3): 1 via ORAL
  Administered 2011-11-07 (×2): 2 via ORAL
  Filled 2011-11-06: qty 1
  Filled 2011-11-06: qty 2
  Filled 2011-11-06 (×2): qty 1

## 2011-11-06 MED ORDER — MIDAZOLAM HCL 5 MG/ML IJ SOLN
INTRAMUSCULAR | Status: AC
  Filled 2011-11-06: qty 1

## 2011-11-06 NOTE — Progress Notes (Signed)
Hgb 7.4   Transfuse 1 U PRBC.

## 2011-11-06 NOTE — Progress Notes (Signed)
Hypokalemia   Replaced  

## 2011-11-06 NOTE — Consult Note (Signed)
Name: [name] MRN: [id number] DOB: [birth year]    LOS: 8  Referring Provider:  [facility] (short) Reason for Referral:  Hypotension, confusion   PULMONARY / CRITICAL CARE MEDICINE  HPI:  61yo male with multiple medical problems including ESRD on HD MWF, diastolic CHF, HTN, significant PVD s/p recent L foot amputation with wound VAC placement for non healing previous toe amputation sites this month, L fem-pop in June with non healing site, recent admission in late May with septic shock r/t CDiff colitis.  He presented 8/8 with c/o continued drainage in L groin and L scrotal abscess.  He was admitted by [facility] with ?recurrent CDiff colitis, scrotal abscess and vascular surgery complications.  On 8/14 he developed hypotension with SBP ~60's in HD, confusion and black liquid stools and PCCM called.    Past Medical History  Diagnosis Date  . Hyperlipidemia   . ESRD (end stage renal disease)     a. MWF dialysis in [place] (followed by Dr. Kristian Covey)  . Arthritis   . Claudication   . Stroke 2005  . Anemia   . Chronic diastolic CHF (congestive heart failure) 07/2010    a. 07/2008 Echo EF 50-55%, mild-mod LVH, Gr 1 DD.  Marland Kitchen Peripheral vascular occlusive disease     a. 07/2011 Periph Angio: No signif Ao-illiac dzs, LCF stenosis, 100% LSFA w recon above knee pop and 3 vessel runoff, 100% RSFA w/ recon above knee --> pending L Fem to below Knee pop bypass.  Marland Kitchen History of tobacco abuse   . Hypertension     takes Amlodipine,Metoprolol,and Prinivil daily  . Dyspnea on exertion     with exertion  . GERD (gastroesophageal reflux disease)   . Cataract     bilateral  . Dialysis patient   . PUD (peptic ulcer disease) mid 1990's    with GI bleed.  endoscoped in mid 1990's at [facility]   Past Surgical History  Procedure Date  . Arteriovenous graft placement   . Av fistula placement 12/10/2000    Right brachiocephalic arteriovenous   . Hernia repair   . Aortagram 07/29/2011    Abdominal Aortagram   . Cardiac catheterization 08/01/11    Left heart catheterization  . Femoral-popliteal bypass graft 09/09/2011    Procedure: BYPASS GRAFT FEMORAL-POPLITEAL ARTERY;  Surgeon: [name], MD;  Location: [facility] OR;  Service: Vascular;  Laterality: Left;  . Amputation 09/12/2011    Procedure: AMPUTATION BELOW KNEE;  Surgeon: [name], MD;  Location: [facility] OR;  Service: Vascular;  Laterality: Left;  TRANSMETATARSAL  . Application of wound vac 10/30/2011    Procedure: APPLICATION OF WOUND VAC;  Surgeon: [name], MD;  Location: [facility] OR;  Service: Vascular;  Laterality: Left;  . Groin debridement 11/01/2011    Procedure: Drucie Ip DEBRIDEMENT;  Surgeon: Chuck Hint, MD;  Location: [facility] OR;  Service: Vascular;  Laterality: Left;   Prior to Admission medications   Medication Sig Start Date End Date Taking? Authorizing Provider  cinacalcet (SENSIPAR) 30 MG tablet Take 2 tablets (60 mg total) by mouth daily with breakfast. 09/16/11 12/15/11 Yes Lars Mage, PA  gabapentin (NEURONTIN) 100 MG capsule Take 2 capsules (200 mg total) by mouth daily. 09/26/11  Yes Evlyn Kanner Love, PA  isosorbide mononitrate (IMDUR) 30 MG 24 hr tablet Take 30 mg by mouth daily.   Yes Historical Provider, MD  multivitamin (RENA-VIT) TABS tablet Take 1 tablet by mouth daily.   Yes Historical Provider, MD  simvastatin (ZOCOR)  5 MG tablet Take 5 mg by mouth at bedtime.   Yes Historical Provider, MD   Allergies No Known Allergies  Family History Family History  Problem Relation Age of Onset  . Breast cancer Mother   . Cancer Father   . Anesthesia problems Neg Hx   . Hypotension Neg Hx   . Malignant hyperthermia Neg Hx   . Pseudochol deficiency Neg Hx    Social History  reports that he quit smoking about 7 months ago. His smoking use included Cigarettes. He has a 40 pack-year smoking history. He does not have any smokeless tobacco history on file. He reports that he does not drink alcohol or use illicit  drugs.  Review Of Systems:  Difficult - pt somewhat confused.  He denies abd pain, n/v, headache, SOB, chest pain.  All other systems reviewed and were neg.    Vital Signs: Temp:  [97.2 F (36.2 C)-99.5 F (37.5 C)] 98.9 F (37.2 C) (08/15 0800) Pulse Rate:  [81-103] 81  (08/15 0900) Resp:  [13-23] 15  (08/15 0900) BP: (102-140)/(35-81) 140/75 mmHg (08/15 0900) SpO2:  [58 %-100 %] 97 % (08/15 0900) Weight:  [57.6 kg (126 lb 15.8 oz)] 57.6 kg (126 lb 15.8 oz) (08/15 0500)  Physical Examination: General:  Chronically ill appearing male, NAD Neuro:  Awake, alert, slightly confused at times, MAE, gen weakness HEENT:  Mm pale, dry, no JVD Cardiovascular:  s1s2 rrr Lungs:  resps even non labored on [place], diminished bases, otherwise CTA Abdomen:  Soft, non tender Ext: L foot amputation with VAC, L groin wound dressed  Active Problems:  CVA  CKD (chronic kidney disease) stage V requiring chronic dialysis  Hypertension  Peripheral vascular disease, unspecified  C. difficile colitis- recalcitrant  ESRD on hemodialysis  Nausea and vomiting in adult  Non-healing surgical wound  Diarrhea  Hypoglycemia  Lymphocele  Infection due to enterococcus  Hypotension  Hypoxia  GI bleed  Hemorrhagic shock  Shock   ASSESSMENT AND PLAN  PULMONARY  Lab 11/05/11 1100  PHART 7.559*  PCO2ART 29.8*  PO2ART 378.0*  HCO3 26.6*  O2SAT --   CXR:  8/14>> improved aeration.   ETT:  N/A.  A:  Hypoxia - resolved P:   Pulmonary hygiene  F/u CXR  O2 as needed   CARDIOVASCULAR  Lab 11/05/11 1116  TROPONINI 1.55*  LATICACIDVEN 6.7*  PROBNP --   ECG:  NSR Lines: none  A: hypotension  - ? R/t possible GI bleeding vs sepsis in setting abscesses.  Improved after 2L in HD.  ??accuracy of BP in vasculopath. SBP labile.  Diastolic CHF  P:  Hold further volume with CHF and ESRD  Check CBC  RENAL  Lab 11/06/11 0239 11/05/11 1058 11/03/11 0941 11/02/11 0701 11/01/11 1630  NA 140 140 143  139 138  K 3.3* 4.1 -- -- --  CL 102 100 98 101 101  CO2 26 25 34* 25 25  BUN 32* 36* 18 41* 43*  CREATININE 2.69* 2.27* 3.72* 6.02* 6.33*  CALCIUM 8.4 8.3* 9.3 9.3 9.4  MG -- 2.0 -- -- --  PHOS -- 3.2 5.6* 7.1* 7.0*   Intake/Output      08/14 0701 - 08/15 0700 08/15 0701 - 08/16 0700   P.O. 420    I.V. (mL/kg) 462.9 (8) 25 (0.4)   Blood 1050    IV Piggyback 100 100   Total Intake(mL/kg) 2032.9 (35.3) 125 (2.2)   Net +2032.9 +125  Foley:  none  A: ESRD P:   HD per renal. Replace K.  GASTROINTESTINAL  Lab 11/05/11 1058 11/03/11 0941 11/02/11 0701 11/01/11 1630  AST 22 -- -- --  ALT 8 -- -- --  ALKPHOS 79 -- -- --  BILITOT 0.2* -- -- --  PROT 5.0* -- -- --  ALBUMIN 1.5* 1.6* 1.5* 1.6*    A:  ??melena Hx CDiff - neg this admit  P:   Stat CBC. Guiac stool. Follow up post EGD. F/U H&H.  HEMATOLOGIC  Lab 11/06/11 0239 11/05/11 1749 11/05/11 1058 11/03/11 0940 11/02/11 0700  HGB 7.4* 3.8* 3.8* 8.0* 8.2*  HCT 21.7* 12.8* 12.8* 26.2* 26.4*  PLT 218 252 304 279 296  INR -- -- 1.40 -- --  APTT -- -- 31 -- --   A:  Anemia - ??chronic disease v acute blood loss with ? GI bleeding P:  Transfuse as needed  Aranesp per renal   INFECTIOUS  Lab 11/06/11 0239 11/05/11 1749 11/05/11 1116 11/05/11 1058 11/03/11 0940 11/02/11 0700 11/01/11 1555  WBC 9.0 11.2* -- 13.8* 14.6* 15.6* --  PROCALCITON -- -- 2.33 -- -- -- 1.43   Cultures: 8/13 CDiff>>>neg 8/7 stool>>> neg 8/14 BCx2 >>> 8/10 L groin wound>>> neg  8/8 crotal abscess>>> enterococcus   Antibiotics: Unasyn 8/13>>>  A:  Wound infection - enterococcus  Recent hx Cdiff - negative this admit   P:   Cont unasyn. Blood cultures as above.  ENDOCRINE  Lab 11/05/11 1054 11/05/11 1046 11/03/11 2245 11/03/11 2005 11/03/11 1851  GLUCAP 102* <10* 109* 116* 126*   A:  DM P:   Hold SSI with mult episodes hypoglycemia   NEUROLOGIC  A: AMS/ confusion - mild.  ?? R/t hypotension.  Improving P:    Supportive care.  D/c sedating meds.   Will f/u post EGD, if H&H are stable and patient tolerates procedure well will transfer to SDU and back to [facility].  Alyson Reedy, M.D. [facility] Pulmonary/Critical Care Medicine. Pager: [phone]. After hours pager: [phone].

## 2011-11-06 NOTE — Progress Notes (Signed)
Patient in HD, will eval wounds with vac off tomorrow  [name]

## 2011-11-06 NOTE — Progress Notes (Signed)
Nutrition Follow-up  Intervention:   1. Continue Nepro shakes BID 2. If pt continues with poor intake, may need nutrition support to continue to heal wounds.  3. RD will continue to follow    Assessment:   Pt was transferred to ICU for hypotension and low O2 sats while in HD. Hgb dropped from 8.0 to 3.8 in 2 days. Noticed melena in rectal tube. GI plans for EGD.  WOC RN still following, RD reviewed notes, some improvement noted. Continues with vac to several wounds.   Pt at EGD at time of RD visit.   Diet Order:  NPO currently, has been on clear liquids or Renal 60/70 for most of this admission.  Supplements: Nepro shake BID between meals  Meds: Scheduled Meds:   . alteplase  4 mg Intracatheter Once  . ampicillin-sulbactam (UNASYN) IV  3 g Intravenous Q24H  . aspirin  81 mg Oral Daily  . darbepoetin (ARANESP) injection - DIALYSIS  100 mcg Intravenous Q Mon-HD  . feeding supplement (NEPRO CARB STEADY)  237 mL Oral BID BM  . gabapentin  200 mg Oral Daily  . isosorbide mononitrate  30 mg Oral Daily  . lidocaine-EPINEPHrine  10 mL Intradermal Once  . multivitamin  1 tablet Oral QHS  . pantoprazole (PROTONIX) IV  40 mg Intravenous Once  . potassium chloride  10 mEq Intravenous Q1 Hr x 2  . sevelamer  1,600 mg Oral TID WC  . simvastatin  5 mg Oral QHS  . sodium chloride  3 mL Intravenous Q12H  . DISCONTD: famotidine (PEPCID) IV  20 mg Intravenous Q24H   Continuous Infusions:   . pantoprozole (PROTONIX) infusion 8 mg/hr (11/06/11 0800)   PRN Meds:.calcium carbonate, fentaNYL, heparin, heparin, ondansetron (ZOFRAN) IV, ondansetron, oxyCODONE-acetaminophen, DISCONTD: fentaNYL, DISCONTD: promethazine  Labs:  CMP     Component Value Date/Time   NA 140 11/06/2011 0239   K 3.3* 11/06/2011 0239   CL 102 11/06/2011 0239   CO2 26 11/06/2011 0239   GLUCOSE 76 11/06/2011 0239   BUN 32* 11/06/2011 0239   CREATININE 2.69* 11/06/2011 0239   CALCIUM 8.4 11/06/2011 0239   PROT 5.0* 11/05/2011  1058   ALBUMIN 1.5* 11/05/2011 1058   AST 22 11/05/2011 1058   ALT 8 11/05/2011 1058   ALKPHOS 79 11/05/2011 1058   BILITOT 0.2* 11/05/2011 1058   GFRNONAA 24* 11/06/2011 0239   GFRAA 28* 11/06/2011 0239     Intake/Output Summary (Last 24 hours) at 11/06/11 1438 Last data filed at 11/06/11 1400  Gross per 24 hour  Intake 3507.92 ml  Output      0 ml  Net 3507.92 ml    Weight Status:  126 lbs, up from 104 lbs yesterday. ? Accuracy of weights. Admission weight was 146 lbs.   Re-estimated needs:  2000-2200 kcal, 85-95 gm protein   Nutrition Dx:  Increased nutrient needs r/t wound healing and HD AEB estimated nutrition needs   Goal:  PO intake of high protein foods and beverages to promote wound healing, unmet  Monitor:  PO intake, weight, wound healing    Clarene Duke RD, LDN Pager [phone] After Hours pager [phone]

## 2011-11-06 NOTE — Op Note (Signed)
[facility] [address] [place], [place]  [address]  ENDOSCOPY PROCEDURE REPORT  PATIENT:  [name], [name]  MR#:  [id number] BIRTHDATE:  [birth year], 60 yrs. old  GENDER:  male ENDOSCOPIST:  Carie Caddy. Lashonda Sonneborn, MD Referred by:  [facility] PROCEDURE DATE:  11/06/2011 PROCEDURE:  EGD with biopsy, 43239, EGD for control of bleeding ASA CLASS:  Class III INDICATIONS:  melena, anemia MEDICATIONS:   Fentanyl 100 mcg IV, Versed 9 mg IV, Benadryl 25 mg IV TOPICAL ANESTHETIC:  Cetacaine Spray  DESCRIPTION OF PROCEDURE:   After the risks benefits and alternatives of the procedure were thoroughly explained, informed consent was obtained.  The Pentax Gastroscope [id number] endoscope was introduced through the mouth and advanced to the second portion of the duodenum, without limitations.  The instrument was slowly withdrawn as the mucosa was fully examined. <<PROCEDUREIMAGES>>  Severe, LA Grade D esophagitis was found in the mid and distal esophagus.  Esophagitis started at 28 cm from the incisors and continued to the GE junction which was located at 40 cm. Mild erythematous gastritis was found in the fundus. Multiple biopsies were obtained and sent to pathology.  A hiatal hernia was found. Mild duodenitis with a single small erosion was found in the bulb of the duodenum.  An angioectasia was found in the bulb of the duodenum. Argon Plasma Coagulation was used to ablate the lesion (flow 1 L per minute, 30 W).  Normal otherwise in the second portion of the duodenum.  Retroflexion exam demonstrated findings as previously described.    The scope was then withdrawn from the patient and the procedure completed.  COMPLICATIONS:  None  ENDOSCOPIC IMPRESSION: 1) Severe esophagitis in the mid and distal esophagus.  No current active bleeding, but esophagitis is felt to be the source of recent GI bleeding 2) Mild gastritis in the fundus.  Biopsies obtained. 3) Hiatal  hernia 4) Duodenitis in the bulb of duodenum 5) Angioectasia in the bulb of duodenum.  Ablated with APC 6) Normal in the second portion duodenum  RECOMMENDATIONS: 1) Can change to BID IV PPI 2) Avoid NSAIDs 3) Await pathology results. 4) Monitor Hgb and HCT, transfuse as needed. 5) Reflux precautions 6) Repeat EGD in 8 weeks to ensure healing.  Carie Caddy. Aleksa Collinsworth, MD  CC:  The Patient  n. eSIGNED:   Carie Caddy. Daelin Haste at 11/06/2011 03:26 PM  Arelia Longest, [phone]

## 2011-11-06 NOTE — Progress Notes (Signed)
Subjective:  More alert today, repeat Hbg 9.3 this am.  Got 3 u PRBC yesterday. Has rectal tube with melena coming out. No abd pain or nausea.   Objective Vital signs in last 24 hours: Filed Vitals:   11/06/11 0900 11/06/11 1000 11/06/11 1100 11/06/11 1129  BP: 140/75 140/69 143/70   Pulse: 81 77 82 79  Temp:    98.3 F (36.8 C)  TempSrc:    Oral  Resp: 15 14 15 26   Height:      Weight:      SpO2: 97% 100% 100% 100%   Weight change: 11.7 kg (25 lb 12.7 oz)  Intake/Output Summary (Last 24 hours) at 11/06/11 1314 Last data filed at 11/06/11 1000  Gross per 24 hour  Intake 2207.92 ml  Output      0 ml  Net 2207.92 ml   Labs: Basic Metabolic Panel:  Lab 11/06/11 1610 11/05/11 1058 11/03/11 0941 11/02/11 0701  NA 140 140 143 --  K 3.3* 4.1 3.5 --  CL 102 100 98 --  CO2 26 25 34* --  GLUCOSE 76 60* 91 --  BUN 32* 36* 18 --  CREATININE 2.69* 2.27* 3.72* --  CALCIUM 8.4 8.3* 9.3 --  ALB -- -- -- --  PHOS -- 3.2 5.6* 7.1*   Liver Function Tests:  Lab 11/05/11 1058 11/03/11 0941 11/02/11 0701  AST 22 -- --  ALT 8 -- --  ALKPHOS 79 -- --  BILITOT 0.2* -- --  PROT 5.0* -- --  ALBUMIN 1.5* 1.6* 1.5*   No results found for this basename: LIPASE:3,AMYLASE:3 in the last 168 hours No results found for this basename: AMMONIA:3 in the last 168 hours CBC:  Lab 11/06/11 0956 11/06/11 0239 11/05/11 1749 11/05/11 1058 11/03/11 0940 10/31/11 0628  WBC 7.7 9.0 11.2* -- -- --  NEUTROABS -- -- -- 12.1* -- 6.4  HGB 9.3* 7.4* 3.8* -- -- --  HCT 26.2* 21.7* 12.8* -- -- --  MCV 91.0 89.3 99.2 100.0 97.8 --  PLT 228 218 252 -- -- --   Cardiac Enzymes:  Lab 11/05/11 1116  CKTOTAL --  CKMB --  CKMBINDEX --  TROPONINI 1.55*   CBG:  Lab 11/05/11 1054 11/05/11 1046 11/03/11 2245 11/03/11 2005 11/03/11 1851  GLUCAP 102* <10* 109* 116* 126*    Iron Studies:  No results found for this basename: IRON,TIBC,TRANSFERRIN,FERRITIN in the last 72 hours Studies/Results: Dg Chest Port 1  View  11/05/2011  *RADIOLOGY REPORT*  Clinical Data: Acute desaturation with shortness of breath.  PORTABLE CHEST - 1 VIEW  Comparison: 10/12/2011.  Findings: Left-sided internal jugular PermCath with tips terminating at the superior cavoatrial junction and in the right atrium.  There is an unusual lucency in the lateral aspect of the left hemithorax, which is favored to represent a skin fold. However, there is also a lucency at the base of the left hemithorax as such that a small left-sided pneumothorax is difficult to exclude.  The moderate right-sided pleural effusion, slightly decreased compared to prior examination.  Improving aeration at the left base with resolving atelectasis.  No evidence of pulmonary edema.  Interstitial prominence and ill-defined opacities in the right perihilar and infrahilar region likely reflects some passive atelectasis (underlying airspace consolidation is difficult to entirely exclude).  The heart size is normal.  Mediastinal contours are unremarkable.  Atherosclerosis of the thoracic aorta.  IMPRESSION: 1.  New lucency in the lateral left hemithorax and at the left base.  Overall, the  findings are favored to reflecting artifact related to a skin fold, however, underlying pneumothorax is difficult to entirely exclude.  A repeat study (preferably with a standing PA and lateral if the patient is able) is recommended at this time. 2.  Overall, there has been improving aeration compared to the prior examination from 10/12/2011, however, there continues to be a moderate right-sided pleural effusion with atelectasis and/or consolidation in the right lower lobe. 3.  Atherosclerosis.  These results were called by telephone on 11/05/2011 at 11:07 to nurse Verdon Cummins at [phone], who verbally acknowledged these results.  Original Report Authenticated By: Florencia Reasons, M.D.   Medications: Infusions:    . pantoprozole (PROTONIX) infusion 8 mg/hr (11/06/11 0800)    Scheduled  Medications:    . alteplase  4 mg Intracatheter Once  . ampicillin-sulbactam (UNASYN) IV  3 g Intravenous Q24H  . aspirin  81 mg Oral Daily  . darbepoetin (ARANESP) injection - DIALYSIS  100 mcg Intravenous Q Mon-HD  . feeding supplement (NEPRO CARB STEADY)  237 mL Oral BID BM  . gabapentin  200 mg Oral Daily  . isosorbide mononitrate  30 mg Oral Daily  . lidocaine-EPINEPHrine  10 mL Intradermal Once  . multivitamin  1 tablet Oral QHS  . pantoprazole (PROTONIX) IV  40 mg Intravenous Once  . potassium chloride  10 mEq Intravenous Q1 Hr x 2  . sevelamer  1,600 mg Oral TID WC  . simvastatin  5 mg Oral QHS  . sodium chloride  3 mL Intravenous Q12H  . DISCONTD: famotidine (PEPCID) IV  20 mg Intravenous Q24H    have reviewed scheduled and prn medications.  Physical Exam: General: chronically ill appearing in air bed.  NAD Heart: RRR Lungs: anteriorly clear Abdomen: soft, nontender Extremities: Left groin wound with VAC in place, L TMA with VAC in place. No LE edema  Dialysis Access: L sided chest catheter.    Summary Pt is a 61 y.o. yo male with ESRD who was admitted on 10/29/2011 with  Poorly healing left transmet wound and continued drainage of left groin wound.    Assessment/Plan:  1. GI bleed 8/14 with melena- for EGD today.  Hb up after 3u prbc 8/14. SQ hep stopped. No hep with HD. 2. 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   3. ESRD- [facility] MWF via PC.  Continue usual HD mwf. Looks dry, weight down since admit 5-10kg. No UF with HD.  4. Anemia due to CKD and GI bleed.-   Giving aranesp, no iron due to high ferritin  5. Secondary hyperparathyroidism- phos 7.1, calc 9.3, adj Ca 11.3. PTH 278, not on vit D or any Ca products. High Ca due to immobilization most likely. Renvela started.  6. HTN/volume- variable but mostly  reasonable.  No BP meds for now. 7. DM-hypoglycemia- per primary team.  8. Dispo- possible LTAC placement when stable.   Vinson Moselle  MD [facility] [phone] pgr    [phone] cell 11/06/2011, 1:14 PM

## 2011-11-06 NOTE — Progress Notes (Signed)
Patient ID: [name], male   DOB: 1950-12-09, 61 y.o.   MRN: [id number]  Subjective: Patient reports that he has no scrotal discomfort or other urologic complaints.  Objective: Vital signs in last 24 hours: Temp:  [97.2 F (36.2 C)-99.5 F (37.5 C)] 99.5 F (37.5 C) (08/15 0600) Pulse Rate:  [63-103] 83  (08/15 0700) Resp:  [12-23] 16  (08/15 0700) BP: (54-136)/(28-81) 131/65 mmHg (08/15 0700) SpO2:  [58 %-100 %] 97 % (08/15 0700) Weight:  [57.6 kg (126 lb 15.8 oz)] 57.6 kg (126 lb 15.8 oz) (08/15 0500)A  Intake/Output from previous day: 08/14 0701 - 08/15 0700 In: 2032.9 [P.O.:420; I.V.:462.9; Blood:1050; IV Piggyback:100] Out: -  Intake/Output this shift:    Past Medical History  Diagnosis Date  . Hyperlipidemia   . ESRD (end stage renal disease)     a. MWF dialysis in [place] (followed by Dr. Kristian Covey)  . Arthritis   . Claudication   . Stroke 2005  . Anemia   . Chronic diastolic CHF (congestive heart failure) 07/2010    a. 07/2008 Echo EF 50-55%, mild-mod LVH, Gr 1 DD.  Marland Kitchen Peripheral vascular occlusive disease     a. 07/2011 Periph Angio: No signif Ao-illiac dzs, LCF stenosis, 100% LSFA w recon above knee pop and 3 vessel runoff, 100% RSFA w/ recon above knee --> pending L Fem to below Knee pop bypass.  Marland Kitchen History of tobacco abuse   . Hypertension     takes Amlodipine,Metoprolol,and Prinivil daily  . Dyspnea on exertion     with exertion  . GERD (gastroesophageal reflux disease)   . Cataract     bilateral  . Dialysis patient   . PUD (peptic ulcer disease) mid 1990's    with GI bleed.  endoscoped in mid 1990's at [facility]    Physical Exam:  The scrotum reveals a nearly completely healed abscess drainage site. There is red granulation tissue lining a shallow opening on the left side of his scrotum with no associated erythema, fluctuance or crepitus.  Lab Results:  Basename 11/06/11 0239 11/05/11 1749 11/05/11 1058  WBC 9.0 11.2* 13.8*  HGB 7.4* 3.8*  3.8*  HCT 21.7* 12.8* 12.8*   BMET  Basename 11/06/11 0239 11/05/11 1058  NA 140 140  K 3.3* 4.1  CL 102 100  CO2 26 25  GLUCOSE 76 60*  BUN 32* 36*  CREATININE 2.69* 2.27*  CALCIUM 8.4 8.3*   No results found for this basename: LABURIN:1 in the last 72 hours Results for orders placed during the hospital encounter of 10/29/11  STOOL CULTURE     Status: Normal   Collection Time   10/29/11  8:12 PM      Component Value Range Status Comment   Specimen Description STOOL   Final    Special Requests NONE   Final    Culture     Final    Value: NO SALMONELLA, SHIGELLA, CAMPYLOBACTER, YERSINIA, OR E.COLI 0157:H7 ISOLATED   Report Status 11/03/2011 FINAL   Final   SURGICAL PCR SCREEN     Status: Normal   Collection Time   10/30/11  6:48 AM      Component Value Range Status Comment   MRSA, PCR NEGATIVE  NEGATIVE Final    Staphylococcus aureus NEGATIVE  NEGATIVE Final   CULTURE, ROUTINE-ABSCESS     Status: Normal   Collection Time   10/30/11  6:27 PM      Component Value Range Status Comment   Specimen Description  ABSCESS SCROTUM   Final    Special Requests NONE   Final    Gram Stain     Final    Value: NO WBC SEEN     FEW SQUAMOUS EPITHELIAL CELLS PRESENT     FEW GRAM POSITIVE COCCI IN PAIRS   Culture MODERATE ENTEROCOCCUS SPECIES   Final    Report Status 11/02/2011 FINAL   Final    Organism ID, Bacteria ENTEROCOCCUS SPECIES   Final   WOUND CULTURE     Status: Normal   Collection Time   11/01/11 12:28 PM      Component Value Range Status Comment   Specimen Description WOUND LEFT GROIN   Final    Special Requests SWABED FLUID FROM GROIN, PT ON VANCO,ZOSYN,FLAGYL   Final    Gram Stain     Final    Value: ABUNDANT WBC PRESENT, PREDOMINANTLY MONONUCLEAR     NO ORGANISMS SEEN   Culture NO GROWTH 2 DAYS   Final    Report Status 11/03/2011 FINAL   Final   ANAEROBIC CULTURE     Status: Normal (Preliminary result)   Collection Time   11/01/11 12:28 PM      Component Value Range Status  Comment   Specimen Description WOUND LEFT GROIN   Final    Special Requests SWABED FLUID FROM GROIN, PT ON VANCO,ZOSYN,FLAGYL   Final    Gram Stain     Final    Value: ABUNDANT WBC PRESENT, PREDOMINANTLY MONONUCLEAR     NO ORGANISMS SEEN   Culture     Final    Value: NO ANAEROBES ISOLATED; CULTURE IN PROGRESS FOR 5 DAYS   Report Status PENDING   Incomplete   CLOSTRIDIUM DIFFICILE BY PCR     Status: Normal   Collection Time   11/04/11 12:43 AM      Component Value Range Status Comment   C difficile by pcr NEGATIVE  NEGATIVE Final     Studies/Results: @RISRSLT24 @  Assessment/Plan: His scrotal abscess grew enterococcus which has been treated with drainage and antibiotic therapy. It is now nearly healed.  1. Continue local dressings b.i.d. with a scrotal support. 2. Please contact me if further urologic assistance is needed.  Laden Fieldhouse C 11/06/2011, 7:45 AM

## 2011-11-06 NOTE — Consult Note (Signed)
Wound care follow-up:  Pt with multiple wounds, all re-assessed. Dr Myra Gianotti at bedside to check on left groin and left foot amputation site. Pt medicated for pain prior to all procedures and tolerated with mod discomfort.  Left groin full thickness wound  3X2X.8cm, 100% beefy red, mod pink drainage, no odor.  Induration to wound edges.  One piece black foam applied to cont suction. Left foot full thickness wound 1X9X1cm, 85% red, 15% yellow.  Sutures remaining to areas of wound.  2 pieces black foam applied to cont suction. Urology following for scrotum wound full thickness .5X.5X.8cm.  Beefy red, minimal tan drainage, no odor, packed with iodoform and covered with tegaderm. Wound appears to be slowly decreasing in depth. 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  Large amt yellow drainage, some odor, maceration to wound edges, inner wound bed 75% yellow, 25% red. Right heel and unstageable wound present on admission, dry intact eschar. No topical treatment needed.   Great toe with dry intact eschar, no topical treatment needed. Continue present plan of care. Pt on low airloss bed to reduce pressure.  Bedside nurses can continue to change vac dressings Q T/TH/Sat and perform daily dressing changes.   Cammie Mcgee, RN, MSN, [employer]  [phone]

## 2011-11-07 ENCOUNTER — Encounter: Payer: Self-pay | Admitting: Gastroenterology

## 2011-11-07 ENCOUNTER — Inpatient Hospital Stay (HOSPITAL_COMMUNITY): Payer: Medicare Other

## 2011-11-07 ENCOUNTER — Encounter (HOSPITAL_COMMUNITY): Payer: Self-pay | Admitting: Internal Medicine

## 2011-11-07 LAB — BASIC METABOLIC PANEL
CO2: 23 mEq/L (ref 19–32)
Chloride: 104 mEq/L (ref 96–112)
GFR calc Af Amer: 17 mL/min — ABNORMAL LOW (ref >90)
Sodium: 140 mEq/L (ref 135–145)

## 2011-11-07 LAB — RENAL FUNCTION PANEL
BUN: 12 mg/dL (ref 6–23)
CO2: 25 mEq/L (ref 19–32)
Calcium: 8.6 mg/dL (ref 8.4–10.5)
Chloride: 102 mEq/L (ref 96–112)
GFR calc Af Amer: 43 mL/min — ABNORMAL LOW (ref >90)
Glucose, Bld: 86 mg/dL (ref 70–99)
Potassium: 4 mEq/L (ref 3.5–5.1)

## 2011-11-07 LAB — TYPE AND SCREEN
ABO/RH(D): O POS
Antibody Screen: NEGATIVE
Unit division: 0
Unit division: 0

## 2011-11-07 LAB — CBC
HCT: 33.5 % — ABNORMAL LOW (ref 39.0–52.0)
Hemoglobin: 11 g/dL — ABNORMAL LOW (ref 13.0–17.0)
MCHC: 32.8 g/dL (ref 30.0–36.0)
Platelets: 238 10*3/uL (ref 150–400)
RBC: 3.12 MIL/uL — ABNORMAL LOW (ref 4.22–5.81)
RDW: 17.1 % — ABNORMAL HIGH (ref 11.5–15.5)
WBC: 7.1 10*3/uL (ref 4.0–10.5)

## 2011-11-07 LAB — MAGNESIUM: Magnesium: 2 mg/dL (ref 1.5–2.5)

## 2011-11-07 LAB — PHOSPHORUS: Phosphorus: 4.3 mg/dL (ref 2.3–4.6)

## 2011-11-07 MED ORDER — OXYCODONE-ACETAMINOPHEN 5-325 MG PO TABS: 1.0000 | ORAL_TABLET | Freq: Four times a day (QID) | ORAL | Status: AC | PRN

## 2011-11-07 MED ORDER — SEVELAMER CARBONATE 800 MG PO TABS: 1600.0000 mg | ORAL_TABLET | Freq: Three times a day (TID) | ORAL | Status: DC

## 2011-11-07 MED ORDER — DARBEPOETIN ALFA-POLYSORBATE 100 MCG/0.5ML IJ SOLN: 100.0000 ug | INTRAMUSCULAR | Status: DC

## 2011-11-07 MED ORDER — CALCIUM CARBONATE ANTACID 500 MG PO CHEW: 1.0000 | CHEWABLE_TABLET | Freq: Four times a day (QID) | ORAL | Status: DC | PRN

## 2011-11-07 MED ORDER — NEPRO/CARBSTEADY PO LIQD: 237.0000 mL | Freq: Two times a day (BID) | ORAL | Status: DC

## 2011-11-07 MED ORDER — ASPIRIN 81 MG PO CHEW: 81.0000 mg | CHEWABLE_TABLET | Freq: Every day | ORAL | Status: DC

## 2011-11-07 MED ORDER — PANTOPRAZOLE SODIUM 40 MG PO TBEC
40.0000 mg | DELAYED_RELEASE_TABLET | Freq: Two times a day (BID) | ORAL | Status: DC
  Administered 2011-11-07: 40 mg via ORAL
  Filled 2011-11-07: qty 1

## 2011-11-07 MED ORDER — PANTOPRAZOLE SODIUM 40 MG PO TBEC: 40.0000 mg | DELAYED_RELEASE_TABLET | Freq: Two times a day (BID) | ORAL | Status: DC

## 2011-11-07 MED ORDER — OXYCODONE-ACETAMINOPHEN 5-325 MG PO TABS
ORAL_TABLET | ORAL | Status: AC
  Administered 2011-11-07: 2 via ORAL
  Filled 2011-11-07: qty 2

## 2011-11-07 NOTE — Progress Notes (Signed)
     [facility] Daily Rounding Note 11/07/2011, 8:38 AM  SUBJECTIVE:       Feels well.  Got clears for AM meal.  No abd pain, no nausea.   OBJECTIVE:         Vital signs in last 24 hours:    Temp:  [97.3 F (36.3 C)-98.8 F (37.1 C)] 97.3 F (36.3 C) (08/16 0720) Pulse Rate:  [73-91] 76  (08/16 0830) Resp:  [12-89] 14  (08/16 0830) BP: (130-160)/(64-93) 146/78 mmHg (08/16 0830) SpO2:  [91 %-100 %] 100 % (08/16 0830) Last BM Date: 11/05/11 General: looks the same, alert and chronically ill   Did not physically reexamine Neuro/Psych:  No tremors, not disoriented or confuse.    Lab Results:  Basename 11/06/11 1759 11/06/11 0956 11/06/11 0239  WBC 7.6 7.7 9.0  HGB 10.1* 9.3* 7.4*  HCT 30.4* 26.2* 21.7*  PLT 236 228 218   BMET  Basename 11/07/11 0524 11/06/11 0239 11/05/11 1058  NA 140 140 140  K 3.8 3.3* 4.1  CL 104 102 100  CO2 23 26 25   GLUCOSE 74 76 60*  BUN 37* 32* 36*  CREATININE 3.99* 2.69* 2.27*  CALCIUM 9.0 8.4 8.3*    Studies/Results: Dg Chest Port 1 View  11/05/2011 .  IMPRESSION: 1.  New lucency in the lateral left hemithorax and at the left base.  Overall, the findings are favored to reflecting artifact related to a skin fold, however, underlying pneumothorax is difficult to entirely exclude.  A repeat study (preferably with a standing PA and lateral if the patient is able) is recommended at this time. 2.  Overall, there has been improving aeration compared to the prior examination from 10/12/2011, however, there continues to be a moderate right-sided pleural effusion with atelectasis and/or consolidation in the right lower lobe. 3.  Atherosclerosis.  These results were called by telephone on 11/05/2011 at 11:07 to nurse Verdon Cummins at [phone], who verbally acknowledged these results.  Original Report Authenticated By: Florencia Reasons, M.D.    ASSESMENT: *  Acute UGIB.  EGD 8/15.  Severe esophagitis is likely source.  Gastritis, HH, duodenitis seen.  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 On BID IV Protonix. *  ABL anemia.  Transfused 4 units total. Hgb improved further. On Aranesp *  Dysphagia, secondary to esophagitis. *  c diff colitis, in previous admissions.  Diarrhea had resolved.  *  ESRD *  periph vascular disease.  Non- healing surgical sites foot , groin, scrotal abcess.   PLAN: *  BID Protonix for 4 weeks, then once daly (was not on PPI, just prn Zantac at home) , try to keep HOB elevated, especially at and post meals. *  Advance diet  *  Follow up gastric biopsy. *  Ok to use ASA 81 mg and subq heparin.  *  Will arrange ROV with Dr Arlyce Dice   LOS: 9 days   Jennye Moccasin  11/07/2011, 8:38 AM Pager: [phone]

## 2011-11-07 NOTE — Progress Notes (Signed)
Spoke to [place], Scientist, research (physical sciences) for [facility]. She states patients room assignment is 318 and Dr. Nelson Chimes will be the accepting physician. [place] [facility] scientist notified for transport [facility]) states they will arrive at 7pm per request of Misty Stanley. Report given to [facility] to assume care of patient. Wound Vac's discontinued and wet to dry dressings applied to sites. Pt is hemodynamically stable and in no acute distress.

## 2011-11-07 NOTE — Progress Notes (Signed)
Notified patient's sister, Manning Luna of patients transfer to [facility] planned for this evening.

## 2011-11-07 NOTE — Progress Notes (Signed)
IV access remains in place, per RN Sabitha at [facility] they will assume care of site.

## 2011-11-07 NOTE — Discharge Summary (Addendum)
Physician Discharge Summary  [name] ZOX:[id number] DOB: [birth year] DOA: 10/29/2011  PCP: Toma Deiters, MD  Admit date: 10/29/2011 Discharge date: 11/07/2011  Recommendations for Outpatient Follow-up:   Wound care: "Left groin full thickness wound 3X2X.8cm, 100% beefy red, mod pink drainage, no odor. Induration to wound edges. One piece black foam applied to cont suction.  Left foot full thickness wound 1X9X1cm, 85% red, 15% yellow. Sutures remaining to areas of wound. 2 pieces black foam applied to cont suction.  Urology following for scrotum wound full thickness .5X.5X.8cm. Beefy red, minimal tan drainage, no odor, packed with iodoform and covered with tegaderm. Wound appears to be slowly decreasing in depth.  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 Large amt yellow drainage, some odor, maceration to wound edges, inner wound bed 75% yellow, 25% red.  Right heel and unstageable wound present on admission, dry intact eschar. No topical treatment needed.  Great toe with dry intact eschar, no topical treatment needed.  Continue present plan of care. Pt on low airloss bed to reduce pressure. Bedside nurses can continue to change vac dressings Q T/TH/Sat and perform daily dressing changes."  Vascular surgery:  Dr. Waverly Ferrari (or patient's primary vascular surgeon) [address] [place] [address] [phone] Patient to call to schedule appointment  GI: Dr. Arlyce Dice 12/18/11 at 1:30PM [address] [facility] [address] [place] [address] [place] [address] [phone]  Urology:  Dr. Vernie Ammons [address] [place] [address] [phone] Follow up as needed if scrotal wound does not continue to heal.    PCP:  Toma Deiters, MD within 1 week Patient to call to schedule appointment  Discharge Diagnoses:  Active Problems:  Hypoxia  Lymphocele  Hypotension  CVA  CKD (chronic  kidney disease) stage V requiring chronic dialysis  Hypertension  Peripheral vascular disease, unspecified  C. difficile colitis- recalcitrant  ESRD on hemodialysis  Nausea and vomiting in adult  Non-healing surgical wound  Diarrhea  Hypoglycemia  Infection due to enterococcus  GI bleed  Hemorrhagic shock  Shock  Esophagitis  Gastritis  Angiodysplasia of intestinal tract   Discharge Condition: Stable, improved  Diet recommendation: Renal  Wt Readings from Last 3 Encounters:  11/06/11 57.6 kg (126 lb 15.8 oz)  11/06/11 57.6 kg (126 lb 15.8 oz)  11/06/11 57.6 kg (126 lb 15.8 oz)    History of present illness:  [name] is an 61 y.o. male. With multiple medical problems, including end-stage renal disease dialyzed Monday Wednesday Friday, and peripheral vascular disease, and a sacral decubitus ulcer.  In late May, 2013 he was hospitalized with septic shock related to C. 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 Myra Gianotti  .  He has had incomplete healing of the left groin wound from his femoropopliteal surgery with chronic serous drainage for weeks, but for reasons unclear came to the emergency room tonight to complain about the chronic inguinal drainage. While being examined he was found to have a draining abscess of the left scrotum, and he admitted that that has been there for 2 days, and that it burst why he was coming out of his car to come into the emergency room.  Additionally while  in the emergency room he has had 7 loose watery stools. He reports that prior to coming to the emergency room he's been having one formed stool per day.  He denies fever or chills, he complains of chronic tailbone pain.  He ambulates with the assistance of an  orthopedic boot on his left foot.  Patient had normal dialysis earlier today  Hospital Course:   The patient was started on broad spectrum antibiotics with vancomycin and zosyn and vascular surgery and urology were consulted.  On 8/8, he underwent incision and drainage of his scrotal abscess, the wound culture of which grew Enterococcus sensitive to ampicillin.  The patient completed a 14-day course of zosyn/unasyn with rapid improvement in drainage and swelling of the area.  Wound care was consulted and assisted with dressing management.  Additionally, the patient underwent further debridement of his left forefoot by vascular surgery on 8/8.  A wound vacuum was placed.  The patient had also developed a lymphocele at the left groin after recent revascularization procedure and vascular surgery evacuated the area and placed a wound vacuum on 8/10.  The patient developed diarrhea, which was C. Diff and stool culture negative and which may have been exacerbated by his antibiotic regimen.  He became hypotensive on the morning of 8/14 prior to hemodialysis to the 70s systolic.  During HD, he was given 2L of fluids when he became suddenly tachypneic and desaturated to the 70s.  He was found to have melanotic stools.  He was transferred to the ICU where he was found to have a hemoglobin of 3.8mg /dl.  He was transfused 6 units PRBC and his hemoglobin on date of discharge is 11.  His blood pressure responded accordingly, however, his blood pressure is difficult to assess except by manual cuff.  EGD was performed on 8/15, which revealed extensive esophagitis, likely the source of the bleeding, gastritis, and duodenitis.  A small AVM was cauterized in the duodenal bulb.  Biopsies were taken, which are pending at the time of discharge.  The patient was started on IV protonix, which was transitioned to oral protonix 40mg  po BID at the time of discharge.    Procedures:  Incision and drainage of scrotal abscess  10/30/11  Metatarsal debridement left foot 10/30/11  Lymphocele drainage left groin 11/01/11  EGD 11/06/11  Hemodialysis  Consultations:  Urology  Vascular Surgery  Nephrology  GI  Critical Care  Wound Care  Discharge Exam: Filed Vitals:   11/07/11 1600  BP: 110/61  Pulse: 83  Temp:   Resp: 15   Filed Vitals:   11/07/11 1315 11/07/11 1400 11/07/11 1500 11/07/11 1600  BP: 144/68 152/72  110/61  Pulse: 82   83  Temp: 97.1 F (36.2 C)     TempSrc: Oral     Resp: 12 18 15 15   Height:      Weight:      SpO2: 96%   97%    General: [place] male, no acute distress, pleasant and conversant HEENT:   PERRL, anicteric, nares and OP clear with MMM Cardiovascular: RRR, 2/6 systolic murmur at the apex.  Left chest HD catheter in place.   Respiratory: Clear to auscultation bilaterally ABD:  Soft, nondistended, nontender. Wound vac in place left groin with of serosanguinous fluid in container GU:  Scrotal wound with minimal drainage, no erythema or swelling.  Sacral decubitus ulcer covered with dressing today - refer to wound care notes above MSK:  Left forefoot with wound vac in  place and 50mL serosanguinous fluid in container.  Right foot with heel ulcer and toe ulcer - refer to wound care notes above.   Discharge Instructions  Discharge Orders    Future Appointments: Provider: Department: Dept Phone: Center:   11/11/2011 8:00 AM [facility] [phone] [facility]   12/18/2011 1:30 PM Louis Meckel, MD [facility] [phone] [facility]     Future Orders Please Complete By Expires   Diet - low sodium heart healthy      Scheduling Instructions:   Renal diet   Increase activity slowly      Discharge instructions      Comments:   You were hospitalized at [facility] because of infection related to a non-healing amputation of the left forefoot, a lymphocele (fluid collection) of the left groin, and an abscess of the left  scrotum.  You were started on antibiotics which you have received for 14-days.  Your wound culture grew Enterococcus which was sensitive to the antibiotic ampicillin.  You had debridement and 1cm bone removal of all 5 digits of your left foot on 10/30/11 with wound vacuum placement by vascular surgery.  You also had incision and drainage of your scrotal abscess by Urology.  On 8/10, you had evacuation of the left groin lymphocele with wound vacuum placement again by vascular surgery.  On 8/14, your blood pressure dropped and you had black tarry stools which indicates bleeding from the GI tract.  You were transferred to the intensive care unit where you received blood transfusions.  You had an EGD, or scope of the esophagus, stomach, and part of the intestines on 8/15 which showed inflammation in all of those regions.  You also had a small blood vessel which was not bleeding, but which was cauterized.  Biopsies were taken and sent to the lab and the results will not be back for several days to weeks.  Please make sure your doctor follows up on the results.    You will need follow up with vascular surgery, your primary care doctor, urology, and your GI doctor.   Remove dressing in 24 hours      Comments:   Please keep wound vacuum in place over left forefoot and left groin.  Please continue Aquacel plus gauze over sacral decubitus ulcer and continue dry pad to left scrotum.   Call MD for:  temperature >100.4      Call MD for:  persistant nausea and vomiting      Call MD for:  severe uncontrolled pain      Call MD for:  redness, tenderness, or signs of infection (pain, swelling, redness, odor or green/yellow discharge around incision site)      Call MD for:  difficulty breathing, headache or visual disturbances      Call MD for:  hives      Call MD for:  persistant dizziness or light-headedness      Call MD for:  extreme fatigue        Medication List  As of 11/07/2011  4:36 PM   TAKE these medications          aspirin 81 MG chewable tablet   Chew 1 tablet (81 mg total) by mouth daily.      calcium carbonate 500 MG chewable tablet   Commonly known as: TUMS - dosed in mg elemental calcium   Chew 1 tablet (200 mg of elemental calcium total) by mouth every 6 (six) hours as needed.  cinacalcet 30 MG tablet   Commonly known as: SENSIPAR   Take 2 tablets (60 mg total) by mouth daily with breakfast.      darbepoetin 100 MCG/0.5ML Soln   Commonly known as: ARANESP   Inject 0.5 mLs (100 mcg total) into the vein every Monday with hemodialysis.      feeding supplement (NEPRO CARB STEADY) Liqd   Take 237 mLs by mouth 2 (two) times daily between meals.      gabapentin 100 MG capsule   Commonly known as: NEURONTIN   Take 2 capsules (200 mg total) by mouth daily.      isosorbide mononitrate 30 MG 24 hr tablet   Commonly known as: IMDUR   Take 30 mg by mouth daily.      multivitamin Tabs tablet   Take 1 tablet by mouth daily.      oxyCODONE-acetaminophen 5-325 MG per tablet   Commonly known as: PERCOCET/ROXICET   Take 1-2 tablets by mouth every 6 (six) hours as needed.      pantoprazole 40 MG tablet   Commonly known as: PROTONIX   Take 1 tablet (40 mg total) by mouth 2 (two) times daily before a meal.      sevelamer 800 MG tablet   Commonly known as: RENVELA   Take 2 tablets (1,600 mg total) by mouth 3 (three) times daily with meals.      simvastatin 5 MG tablet   Commonly known as: ZOCOR   Take 5 mg by mouth at bedtime.           Follow-up Information    Follow up with Melvia Heaps, MD on 12/18/2011. (at 1:30pm; to follow up bleeding)    Contact information:   [address] [facility] [address] [place] [address] [place] [address] [phone]           The results of significant diagnostics from this hospitalization (including imaging, microbiology, ancillary and laboratory) are listed below for reference.    Significant Diagnostic Studies: Dg Abd 1  View  11/04/2011  *RADIOLOGY REPORT*  Clinical Data: Abdominal tenderness  ABDOMEN - 1 VIEW  Comparison: Abdomen film of 11/03/2011 and CT abdomen pelvis of 10/31/2011  Findings: No present bowel obstruction is seen.  Both large and small bowel gas is present without distention.  Calcifications in the left upper quadrant are again noted as previously described.  IMPRESSION: No evidence of bowel obstruction.  Original Report Authenticated By: Juline Patch, M.D.   Dg Abd 1 View  11/03/2011  *RADIOLOGY REPORT*  Clinical Data: Abdominal pain.  Ileus.  ABDOMEN - 1 VIEW  Comparison: 10/31/2011.  Findings: Stable appearance of calcifications in the left upper quadrant.  The bowel gas pattern appears within normal limits, nonobstructive.  Extensive atherosclerosis.  Stool is present within the rectum. Oral contrast is present within small bowel, presumably administered in conjunction with prior CT.  IMPRESSION: Normal bowel gas pattern. Persistence of small bowel oral contrast suggests ileus.  Original Report Authenticated By: Andreas Newport, M.D.   Ct Abdomen Pelvis W Contrast  10/31/2011  *RADIOLOGY REPORT*  Clinical Data: Abdominal pain.  Nausea and vomiting.  Evaluate masses.  CT ABDOMEN AND PELVIS WITH CONTRAST  Technique:  Multidetector CT imaging of the abdomen and pelvis was performed following the standard protocol during bolus administration of intravenous contrast.  Contrast: 80mL OMNIPAQUE IOHEXOL 300 MG/ML  SOLN  Comparison: 08/27/2011  Findings: Small bilateral pleural effusions with basilar atelectasis, demonstrating mild improvement since the  previous study.  The liver parenchyma is homogeneous.  The spleen appears enlarged.  The gallbladder is poorly defined and is likely contracted.  There is infiltration throughout the subcutaneous and intra-abdominal fat consistent with edema.  No discrete fluid is demonstrated.  The pancreas and adrenal glands are unremarkable. There is extensive atherosclerotic  calcification throughout the aortoiliac vessels and branch vessels.  Multicystic kidneys with multiple calcified lesions, likely cysts bilaterally.  No hydronephrosis.  Minimal if any visualized nephrograms.  Diffuse calcification of the renal arteries.  No significant change since previous study.  Dense calcifications in the celiac axis, superior mesenteric artery, and proximal iliac arteries likely to represent a stenosis.  Flow is demonstrated in these vessels.  Esophagus is distended with contrast material.  This could be due to reflux or dysmotility.  The stomach is distended by ingested material.  No wall thickening.  The small and large bowel do not appear significantly distended.  No free air in the abdomen.  Surgical changes in the left anterior abdominal wall.  Pelvis:  Free fluid in the pelvis with density measurements suggesting ascites.  Prostatic enlargement.  Mild thickening of bladder wall.  In the left groin region, there are surgical clips.  There is a loculated gas and fluid collection in the left groin measuring 4.3 x 4.5 cm with a thick enhancing wall and adjacent enlarged enhancing lymph nodes.  This is consistent with abscess.  The abscess extends down anteriorly and to the abductor muscle region. This is new since the previous study.  Degenerative changes in the lumbar spine.  Lucencies in the femoral head and acetabular regions bilaterally likely representing degenerative cysts and stable since previous study.  IMPRESSION: 1. Since the previous study, there is interval development of an abscess with adjacent inflammatory lymph nodes in the left groin region.  2. Decreasing bilateral pleural effusions and basilar atelectasis. 3.  Multiple additional findings are stable since previous study.  Results were discussed by telephone at the time of dictation, 0150 hours on 10/31/2011, with the patient's nurse, Lanora Manis, on [facility] 5500.  Original Report Authenticated By: Marlon Pel, M.D.   Dg  Chest Port 1 View  11/05/2011  *RADIOLOGY REPORT*  Clinical Data: Acute desaturation with shortness of breath.  PORTABLE CHEST - 1 VIEW  Comparison: 10/12/2011.  Findings: Left-sided internal jugular PermCath with tips terminating at the superior cavoatrial junction and in the right atrium.  There is an unusual lucency in the lateral aspect of the left hemithorax, which is favored to represent a skin fold. However, there is also a lucency at the base of the left hemithorax as such that a small left-sided pneumothorax is difficult to exclude.  The moderate right-sided pleural effusion, slightly decreased compared to prior examination.  Improving aeration at the left base with resolving atelectasis.  No evidence of pulmonary edema.  Interstitial prominence and ill-defined opacities in the right perihilar and infrahilar region likely reflects some passive atelectasis (underlying airspace consolidation is difficult to entirely exclude).  The heart size is normal.  Mediastinal contours are unremarkable.  Atherosclerosis of the thoracic aorta.  IMPRESSION: 1.  New lucency in the lateral left hemithorax and at the left base.  Overall, the findings are favored to reflecting artifact related to a skin fold, however, underlying pneumothorax is difficult to entirely exclude.  A repeat study (preferably with a standing PA and lateral if the patient is able) is recommended at this time. 2.  Overall, there has been improving aeration compared to  the prior examination from 10/12/2011, however, there continues to be a moderate right-sided pleural effusion with atelectasis and/or consolidation in the right lower lobe. 3.  Atherosclerosis.  These results were called by telephone on 11/05/2011 at 11:07 to nurse Verdon Cummins at [phone], who verbally acknowledged these results.  Original Report Authenticated By: Florencia Reasons, M.D.   Portable Chest 1 View  10/12/2011  *RADIOLOGY REPORT*  Clinical Data: SOB, evaluate for pneumonia   PORTABLE CHEST - 1 VIEW  Comparison: 10/11/2011  Findings: Cardiomegaly with mild interstitial edema and moderate right pleural effusion.  Suspected small left pleural effusion.  Associated right lower lobe opacity, likely atelectasis, underlying pneumonia not excluded.  No pneumothorax.  Stable left IJ venous catheter.  IMPRESSION: Cardiomegaly with mild interstitial edema and moderate right pleural effusion.  Suspected small left pleural effusion.  Associate right lower lobe opacity, likely atelectasis, underlying pneumonia not excluded.  Original Report Authenticated By: Charline Bills, M.D.   Dg Abd Acute W/chest  10/11/2011  *RADIOLOGY REPORT*  Clinical Data: Abdominal pain, nausea and vomiting.  ACUTE ABDOMEN SERIES (ABDOMEN 2 VIEW & CHEST 1 VIEW)  Comparison: Abdominal radiographs 08/28/2011.  Chest x-ray 09/10/2011.  Findings: Left internal jugular PermCath with the tips terminating in the right atrium and at the superior cavoatrial junction is unchanged.  Lung volumes are low.  Linear opacities in the left mid and lower lung are most consistent with atelectasis.  Patchy airspace consolidation and volume loss in the right middle and lower lobes.  Trace left and small right-sided pleural effusions. No pneumoperitoneum.  Supine and upright views of the abdomen demonstrate gas and stool scattered throughout the colon extending to the level of the distal rectum.  No pathologic distension of small bowel is noted.  Midline surgical staples are again noted.  Numerous vascular calcifications.  In addition, there are numerous egg-shell type calcifications in the left upper quadrant of the abdomen which appear to correspond with numerous calcified cystic lesions based on comparison with prior CT scans.  IMPRESSION: 1.  Nonobstructive bowel gas pattern. 2.  No pneumoperitoneum. 3.  Support apparatus and postoperative changes, as above. 4.  Air space consolidation and atelectasis in the right middle and lower  lobes, concerning for pneumonia and/or sequelae of aspiration. 5.  Small right and trace left-sided pleural effusions. 6.  Atherosclerosis.  Original Report Authenticated By: Florencia Reasons, M.D.   Dg Foot Complete Left  10/17/2011  *RADIOLOGY REPORT*  Clinical Data: Wound infection  LEFT FOOT - COMPLETE 3+ VIEW  Comparison: None.  Findings: The patient is status post amputation of the foot beyond the proximal aspect of the five metatarsals.  No acute fracture. No dislocation.  No periosteal reaction.  No destructive bone lesion.  Vascular calcifications are prominent.  An open wound at the amputation site is noted.  IMPRESSION: Status post forefoot amputation.  Open wound is present at the amputation site.  No acute bony pathology.  Original Report Authenticated By: Donavan Burnet, M.D.    Microbiology: Recent Results (from the past 240 hour(s))  STOOL CULTURE     Status: Normal   Collection Time   10/29/11  8:12 PM      Component Value Range Status Comment   Specimen Description STOOL   Final    Special Requests NONE   Final    Culture     Final    Value: NO SALMONELLA, SHIGELLA, CAMPYLOBACTER, YERSINIA, OR E.COLI 0157:H7 ISOLATED   Report Status 11/03/2011 FINAL   Final  SURGICAL PCR SCREEN     Status: Normal   Collection Time   10/30/11  6:48 AM      Component Value Range Status Comment   MRSA, PCR NEGATIVE  NEGATIVE Final    Staphylococcus aureus NEGATIVE  NEGATIVE Final   CULTURE, ROUTINE-ABSCESS     Status: Normal   Collection Time   10/30/11  6:27 PM      Component Value Range Status Comment   Specimen Description ABSCESS SCROTUM   Final    Special Requests NONE   Final    Gram Stain     Final    Value: NO WBC SEEN     FEW SQUAMOUS EPITHELIAL CELLS PRESENT     FEW GRAM POSITIVE COCCI IN PAIRS   Culture MODERATE ENTEROCOCCUS SPECIES   Final    Report Status 11/02/2011 FINAL   Final    Organism ID, Bacteria ENTEROCOCCUS SPECIES   Final   WOUND CULTURE     Status: Normal    Collection Time   11/01/11 12:28 PM      Component Value Range Status Comment   Specimen Description WOUND LEFT GROIN   Final    Special Requests SWABED FLUID FROM GROIN, PT ON VANCO,ZOSYN,FLAGYL   Final    Gram Stain     Final    Value: ABUNDANT WBC PRESENT, PREDOMINANTLY MONONUCLEAR     NO ORGANISMS SEEN   Culture NO GROWTH 2 DAYS   Final    Report Status 11/03/2011 FINAL   Final   ANAEROBIC CULTURE     Status: Normal   Collection Time   11/01/11 12:28 PM      Component Value Range Status Comment   Specimen Description WOUND LEFT GROIN   Final    Special Requests SWABED FLUID FROM GROIN, PT ON VANCO,ZOSYN,FLAGYL   Final    Gram Stain     Final    Value: ABUNDANT WBC PRESENT, PREDOMINANTLY MONONUCLEAR     NO ORGANISMS SEEN   Culture NO ANAEROBES ISOLATED   Final    Report Status 11/06/2011 FINAL   Final   CLOSTRIDIUM DIFFICILE BY PCR     Status: Normal   Collection Time   11/04/11 12:43 AM      Component Value Range Status Comment   C difficile by pcr NEGATIVE  NEGATIVE Final   CULTURE, BLOOD (ROUTINE X 2)     Status: Normal (Preliminary result)   Collection Time   11/05/11 11:16 AM      Component Value Range Status Comment   Specimen Description BLOOD RIGHT HAND   Final    Special Requests BOTTLES DRAWN AEROBIC ONLY 5CC   Final    Culture  Setup Time 11/05/2011 16:58   Final    Culture     Final    Value:        BLOOD CULTURE RECEIVED NO GROWTH TO DATE CULTURE WILL BE HELD FOR 5 DAYS BEFORE ISSUING A FINAL NEGATIVE REPORT   Report Status PENDING   Incomplete   CULTURE, BLOOD (ROUTINE X 2)     Status: Normal (Preliminary result)   Collection Time   11/05/11 11:28 AM      Component Value Range Status Comment   Specimen Description BLOOD RIGHT HAND   Final    Special Requests BOTTLES DRAWN AEROBIC ONLY [facility]   Final    Culture  Setup Time 11/05/2011 16:58   Final    Culture     Final    Value:  BLOOD CULTURE RECEIVED NO GROWTH TO DATE CULTURE WILL BE HELD FOR 5 DAYS BEFORE  ISSUING A FINAL NEGATIVE REPORT   Report Status PENDING   Incomplete      Labs: Basic Metabolic Panel:  Lab 11/07/11 1610 11/07/11 0524 11/06/11 0239 11/05/11 1058 11/03/11 0941 11/02/11 0701  NA 139 140 140 140 143 --  K 4.0 3.8 3.3* 4.1 3.5 --  CL 102 104 102 100 98 --  CO2 25 23 26 25  34* --  GLUCOSE 86 74 76 60* 91 --  BUN 12 37* 32* 36* 18 --  CREATININE 1.85* 3.99* 2.69* 2.27* 3.72* --  CALCIUM 8.6 9.0 8.4 8.3* 9.3 --  MG -- 2.0 -- 2.0 -- --  PHOS 2.4 4.3 -- 3.2 5.6* 7.1*   Liver Function Tests:  Lab 11/07/11 1433 11/05/11 1058 11/03/11 0941 11/02/11 0701 11/01/11 1630  AST -- 22 -- -- --  ALT -- 8 -- -- --  ALKPHOS -- 79 -- -- --  BILITOT -- 0.2* -- -- --  PROT -- 5.0* -- -- --  ALBUMIN 1.5* 1.5* 1.6* 1.5* 1.6*   No results found for this basename: LIPASE:5,AMYLASE:5 in the last 168 hours No results found for this basename: AMMONIA:5 in the last 168 hours CBC:  Lab 11/07/11 1433 11/07/11 0524 11/06/11 1759 11/06/11 0956 11/06/11 0239 11/05/11 1058  WBC 8.8 7.1 7.6 7.7 9.0 --  NEUTROABS -- -- -- -- -- 12.1*  HGB 11.0* 9.8* 10.1* 9.3* 7.4* --  HCT 33.5* 28.9* 30.4* 26.2* 21.7* --  MCV 91.8 92.6 90.7 91.0 89.3 --  PLT 225 238 236 228 218 --   Cardiac Enzymes:  Lab 11/05/11 1116  CKTOTAL --  CKMB --  CKMBINDEX --  TROPONINI 1.55*   BNP: BNP (last 3 results)  Basename 08/28/11 0540 08/27/11 0930 08/26/11 2305  PROBNP 21860.0* 19652.0* 25900.0*   CBG:  Lab 11/05/11 1054 11/05/11 1046 11/03/11 2245 11/03/11 2005 11/03/11 1851  GLUCAP 102* <10* 109* 116* 126*    Time coordinating discharge: 60 minutes  Signed:  Seniah Lawrence  [facility] 11/07/2011, 4:36 PM

## 2011-11-07 NOTE — Progress Notes (Signed)
Patient seen, examined, and I agree with the above documentation, including the assessment and plan. Would rec BID PPI for at least 8 weeks (rather than four, and then daily thereafter). Repeat EGD in about 3 months to ensure healing Follow-up path results. Call with questions.

## 2011-11-07 NOTE — Progress Notes (Signed)
Pt to 2506 from HD, assumed care of pt.

## 2011-11-07 NOTE — Progress Notes (Signed)
ANTIBIOTIC CONSULT NOTE - FOLLOW UP  Pharmacy Consult for Unasyn Indication: scrotal abscess  No Known Allergies  Patient Measurements: Height: 5\' 6"  (167.6 cm) Weight: 126 lb 15.8 oz (57.6 kg) IBW/kg (Calculated) : 63.8   Vital Signs: Temp: 97.3 F (36.3 C) (08/16 0720) Temp src: Oral (08/16 0720) BP: 143/69 mmHg (08/16 1130) Pulse Rate: 78  (08/16 1130) Intake/Output from previous day: 08/15 0701 - 08/16 0700 In: 1040 [P.O.:180; I.V.:360; Blood:300; IV Piggyback:200] Out: 500 [Stool:500] Intake/Output from this shift: Total I/O In: 10 [I.V.:10] Out: -   Labs:  Basename 11/07/11 0524 11/06/11 1759 11/06/11 0956 11/06/11 0239 11/05/11 1058  WBC 7.1 7.6 7.7 -- --  HGB 9.8* 10.1* 9.3* -- --  PLT 238 236 228 -- --  LABCREA -- -- -- -- --  CREATININE 3.99* -- -- 2.69* 2.27*   Estimated Creatinine Clearance: 15.6 ml/min (by C-G formula based on Cr of 3.99). No results found for this basename: VANCOTROUGH:2,VANCOPEAK:2,VANCORANDOM:2,GENTTROUGH:2,GENTPEAK:2,GENTRANDOM:2,TOBRATROUGH:2,TOBRAPEAK:2,TOBRARND:2,AMIKACINPEAK:2,AMIKACINTROU:2,AMIKACIN:2, in the last 72 hours   Microbiology: Recent Results (from the past 720 hour(s))  MRSA PCR SCREENING     Status: Abnormal   Collection Time   10/11/11  4:35 PM      Component Value Range Status Comment   MRSA by PCR POSITIVE (*) NEGATIVE Final   STOOL CULTURE     Status: Normal   Collection Time   10/29/11  8:12 PM      Component Value Range Status Comment   Specimen Description STOOL   Final    Special Requests NONE   Final    Culture     Final    Value: NO SALMONELLA, SHIGELLA, CAMPYLOBACTER, YERSINIA, OR E.COLI 0157:H7 ISOLATED   Report Status 11/03/2011 FINAL   Final   SURGICAL PCR SCREEN     Status: Normal   Collection Time   10/30/11  6:48 AM      Component Value Range Status Comment   MRSA, PCR NEGATIVE  NEGATIVE Final    Staphylococcus aureus NEGATIVE  NEGATIVE Final   CULTURE, ROUTINE-ABSCESS     Status: Normal   Collection Time   10/30/11  6:27 PM      Component Value Range Status Comment   Specimen Description ABSCESS SCROTUM   Final    Special Requests NONE   Final    Gram Stain     Final    Value: NO WBC SEEN     FEW SQUAMOUS EPITHELIAL CELLS PRESENT     FEW GRAM POSITIVE COCCI IN PAIRS   Culture MODERATE ENTEROCOCCUS SPECIES   Final    Report Status 11/02/2011 FINAL   Final    Organism ID, Bacteria ENTEROCOCCUS SPECIES   Final   WOUND CULTURE     Status: Normal   Collection Time   11/01/11 12:28 PM      Component Value Range Status Comment   Specimen Description WOUND LEFT GROIN   Final    Special Requests SWABED FLUID FROM GROIN, PT ON VANCO,ZOSYN,FLAGYL   Final    Gram Stain     Final    Value: ABUNDANT WBC PRESENT, PREDOMINANTLY MONONUCLEAR     NO ORGANISMS SEEN   Culture NO GROWTH 2 DAYS   Final    Report Status 11/03/2011 FINAL   Final   ANAEROBIC CULTURE     Status: Normal   Collection Time   11/01/11 12:28 PM      Component Value Range Status Comment   Specimen Description WOUND LEFT GROIN   Final  Special Requests SWABED FLUID FROM GROIN, PT ON VANCO,ZOSYN,FLAGYL   Final    Gram Stain     Final    Value: ABUNDANT WBC PRESENT, PREDOMINANTLY MONONUCLEAR     NO ORGANISMS SEEN   Culture NO ANAEROBES ISOLATED   Final    Report Status 11/06/2011 FINAL   Final   CLOSTRIDIUM DIFFICILE BY PCR     Status: Normal   Collection Time   11/04/11 12:43 AM      Component Value Range Status Comment   C difficile by pcr NEGATIVE  NEGATIVE Final   CULTURE, BLOOD (ROUTINE X 2)     Status: Normal (Preliminary result)   Collection Time   11/05/11 11:16 AM      Component Value Range Status Comment   Specimen Description BLOOD RIGHT HAND   Final    Special Requests BOTTLES DRAWN AEROBIC ONLY 5CC   Final    Culture  Setup Time 11/05/2011 16:58   Final    Culture     Final    Value:        BLOOD CULTURE RECEIVED NO GROWTH TO DATE CULTURE WILL BE HELD FOR 5 DAYS BEFORE ISSUING A FINAL NEGATIVE  REPORT   Report Status PENDING   Incomplete   CULTURE, BLOOD (ROUTINE X 2)     Status: Normal (Preliminary result)   Collection Time   11/05/11 11:28 AM      Component Value Range Status Comment   Specimen Description BLOOD RIGHT HAND   Final    Special Requests BOTTLES DRAWN AEROBIC ONLY 2CC   Final    Culture  Setup Time 11/05/2011 16:58   Final    Culture     Final    Value:        BLOOD CULTURE RECEIVED NO GROWTH TO DATE CULTURE WILL BE HELD FOR 5 DAYS BEFORE ISSUING A FINAL NEGATIVE REPORT   Report Status PENDING   Incomplete     Anti-infectives     Start     Dose/Rate Route Frequency Ordered Stop   11/28/11 1000   vancomycin (VANCOCIN) 50 mg/mL oral solution 125 mg  Status:  Discontinued        125 mg Oral Every 3 DAYS 10/30/11 0127 11/01/11 2307   11/20/11 1000   vancomycin (VANCOCIN) 50 mg/mL oral solution 125 mg  Status:  Discontinued        125 mg Oral Every other day 10/30/11 0127 11/01/11 2307   11/13/11 1000   vancomycin (VANCOCIN) 50 mg/mL oral solution 125 mg  Status:  Discontinued        125 mg Oral Daily 10/30/11 0127 11/01/11 2307   11/05/11 2200   vancomycin (VANCOCIN) 50 mg/mL oral solution 125 mg  Status:  Discontinued        125 mg Oral 2 times daily 10/30/11 0127 11/01/11 2307   11/05/11 1200   vancomycin (VANCOCIN) 500 mg in sodium chloride 0.9 % 100 mL IVPB  Status:  Discontinued        500 mg 100 mL/hr over 60 Minutes Intravenous Every M-W-F (Hemodialysis) 11/03/11 1637 11/03/11 1942   11/04/11 2200   Ampicillin-Sulbactam (UNASYN) 3 g in sodium chloride 0.9 % 100 mL IVPB        3 g 100 mL/hr over 60 Minutes Intravenous Every 24 hours 11/04/11 1337     11/03/11 1200   vancomycin (VANCOCIN) 750 mg in sodium chloride 0.9 % 150 mL IVPB  Status:  Discontinued  750 mg 150 mL/hr over 60 Minutes Intravenous Every M-W-F (Hemodialysis) 11/01/11 1303 11/03/11 1637   11/02/11 1000   vancomycin (VANCOCIN) 750 mg in sodium chloride 0.9 % 150 mL IVPB         750 mg 150 mL/hr over 60 Minutes Intravenous  Once 11/02/11 0909 11/02/11 1205   11/01/11 1700   vancomycin (VANCOCIN) 750 mg in sodium chloride 0.9 % 150 mL IVPB  Status:  Discontinued        750 mg 150 mL/hr over 60 Minutes Intravenous  Once 11/01/11 1301 11/02/11 0908   10/31/11 1200   vancomycin (VANCOCIN) 750 mg in sodium chloride 0.9 % 150 mL IVPB  Status:  Discontinued        750 mg 150 mL/hr over 60 Minutes Intravenous Every M-W-F (Hemodialysis) 10/30/11 0407 11/01/11 1303   10/30/11 0600   piperacillin-tazobactam (ZOSYN) IVPB 2.25 g  Status:  Discontinued        2.25 g 100 mL/hr over 30 Minutes Intravenous Every 8 hours 10/30/11 0407 11/04/11 1244   10/30/11 0415   vancomycin (VANCOCIN) 500 mg in sodium chloride 0.9 % 100 mL IVPB        500 mg 100 mL/hr over 60 Minutes Intravenous  Once 10/30/11 0407 10/30/11 0535   10/30/11 0130   metroNIDAZOLE (FLAGYL) tablet 500 mg  Status:  Discontinued        500 mg Oral 3 times per day 10/30/11 0127 11/01/11 2307   10/30/11 0130   vancomycin (VANCOCIN) 50 mg/mL oral solution 125 mg  Status:  Discontinued        125 mg Oral 4 times daily 10/30/11 0127 11/01/11 2307   10/30/11 0115   vancomycin (VANCOCIN) IVPB 1000 mg/200 mL premix        1,000 mg 200 mL/hr over 60 Minutes Intravenous  Once 10/30/11 0101 10/30/11 0343   10/30/11 0100   Ampicillin-Sulbactam (UNASYN) 3 g in sodium chloride 0.9 % 100 mL IVPB        3 g 100 mL/hr over 60 Minutes Intravenous  Once 10/30/11 0047 10/30/11 0214   10/30/11 0100   ciprofloxacin (CIPRO) IVPB 400 mg        400 mg 200 mL/hr over 60 Minutes Intravenous  Once 10/30/11 0057 10/30/11 0103          Assessment: 60yo male admitted with draining inguinal wound for several weeks as well as tender swelling of left scrotum for 2 days. Patient underwent I&D of scrotal abscess on 8/8 and was started on vancomycin and zosyn. Culture results from abscess reveal enterococcus susceptible to vancomycin and  ampicillin. Vanc and zosyn  d/c'ed and patient initiated on Unasyn for coverage on 8/13. WBC wnl (down from a peak of 15.6 on 8/11). Patient is afebrile. Today is Day #9 of appropriate antibiotic therapy, please clarify total length of therapy.  Goal of Therapy:  Eradication of infection Clinical improvement  Plan:  Unasyn 3g every 24hrs  Follow up antibiotic length of therapy  Lillia Pauls, PharmD Clinical Pharmacist Pager: [phone] Phone: [phone] 11/07/2011 12:02 PM

## 2011-11-07 NOTE — Progress Notes (Signed)
Pt transported via [facility] [facility] with destination of [facility].

## 2011-11-07 NOTE — Progress Notes (Signed)
Rectal tube (Flexiseal) Pouch removed.

## 2011-11-07 NOTE — Procedures (Signed)
I was present at this dialysis session. I have reviewed the session itself and made appropriate changes.   Rob Emoni Yang, MD [facility] 11/07/2011, 11:34 AM   

## 2011-11-07 NOTE — Progress Notes (Signed)
Subjective:  No complaints. Minimal melena in bag. Eating without difficulty.  EGD showed severe grade IV esophagitis.  Objective Vital signs in last 24 hours: Filed Vitals:   11/07/11 0720 11/07/11 0754 11/07/11 0830 11/07/11 0900  BP: 151/80 150/74 146/78 156/79  Pulse: 76 73 76 76  Temp: 97.3 F (36.3 C)     TempSrc: Oral     Resp: 14 14 14 13   Height:      Weight:      SpO2: 99% 100% 100% 95%   Weight change:   Intake/Output Summary (Last 24 hours) at 11/07/11 0931 Last data filed at 11/07/11 0800  Gross per 24 hour  Intake    600 ml  Output    500 ml  Net    100 ml   Labs: Basic Metabolic Panel:  Lab 11/07/11 4098 11/06/11 0239 11/05/11 1058 11/03/11 0941  NA 140 140 140 --  K 3.8 3.3* 4.1 --  CL 104 102 100 --  CO2 23 26 25  --  GLUCOSE 74 76 60* --  BUN 37* 32* 36* --  CREATININE 3.99* 2.69* 2.27* --  CALCIUM 9.0 8.4 8.3* --  ALB -- -- -- --  PHOS 4.3 -- 3.2 5.6*   Liver Function Tests:  Lab 11/05/11 1058 11/03/11 0941 11/02/11 0701  AST 22 -- --  ALT 8 -- --  ALKPHOS 79 -- --  BILITOT 0.2* -- --  PROT 5.0* -- --  ALBUMIN 1.5* 1.6* 1.5*   No results found for this basename: LIPASE:3,AMYLASE:3 in the last 168 hours No results found for this basename: AMMONIA:3 in the last 168 hours CBC:  Lab 11/07/11 0524 11/06/11 1759 11/06/11 0956 11/06/11 0239 11/05/11 1749 11/05/11 1058  WBC 7.1 7.6 7.7 -- -- --  NEUTROABS -- -- -- -- -- 12.1*  HGB 9.8* 10.1* 9.3* -- -- --  HCT 28.9* 30.4* 26.2* -- -- --  MCV 92.6 90.7 91.0 89.3 99.2 --  PLT 238 236 228 -- -- --   Cardiac Enzymes:  Lab 11/05/11 1116  CKTOTAL --  CKMB --  CKMBINDEX --  TROPONINI 1.55*   CBG:  Lab 11/05/11 1054 11/05/11 1046 11/03/11 2245 11/03/11 2005 11/03/11 1851  GLUCAP 102* <10* 109* 116* 126*    Iron Studies:  No results found for this basename: IRON,TIBC,TRANSFERRIN,FERRITIN in the last 72 hours Studies/Results: Dg Chest Port 1 View  11/05/2011  *RADIOLOGY REPORT*  Clinical  Data: Acute desaturation with shortness of breath.  PORTABLE CHEST - 1 VIEW  Comparison: 10/12/2011.  Findings: Left-sided internal jugular PermCath with tips terminating at the superior cavoatrial junction and in the right atrium.  There is an unusual lucency in the lateral aspect of the left hemithorax, which is favored to represent a skin fold. However, there is also a lucency at the base of the left hemithorax as such that a small left-sided pneumothorax is difficult to exclude.  The moderate right-sided pleural effusion, slightly decreased compared to prior examination.  Improving aeration at the left base with resolving atelectasis.  No evidence of pulmonary edema.  Interstitial prominence and ill-defined opacities in the right perihilar and infrahilar region likely reflects some passive atelectasis (underlying airspace consolidation is difficult to entirely exclude).  The heart size is normal.  Mediastinal contours are unremarkable.  Atherosclerosis of the thoracic aorta.  IMPRESSION: 1.  New lucency in the lateral left hemithorax and at the left base.  Overall, the findings are favored to reflecting artifact related to a skin fold, however,  underlying pneumothorax is difficult to entirely exclude.  A repeat study (preferably with a standing PA and lateral if the patient is able) is recommended at this time. 2.  Overall, there has been improving aeration compared to the prior examination from 10/12/2011, however, there continues to be a moderate right-sided pleural effusion with atelectasis and/or consolidation in the right lower lobe. 3.  Atherosclerosis.  These results were called by telephone on 11/05/2011 at 11:07 to nurse Verdon Cummins at [phone], who verbally acknowledged these results.  Original Report Authenticated By: Florencia Reasons, M.D.   Medications: Infusions:    . sodium chloride 10 mL/hr at 11/06/11 2000  . DISCONTD: pantoprozole (PROTONIX) infusion 8 mg/hr (11/06/11 0800)    Scheduled  Medications:    . alteplase  4 mg Intracatheter Once  . ampicillin-sulbactam (UNASYN) IV  3 g Intravenous Q24H  . aspirin  81 mg Oral Daily  . darbepoetin (ARANESP) injection - DIALYSIS  100 mcg Intravenous Q Mon-HD  . feeding supplement (NEPRO CARB STEADY)  237 mL Oral BID BM  . gabapentin  200 mg Oral Daily  . isosorbide mononitrate  30 mg Oral Daily  . lidocaine-EPINEPHrine  10 mL Intradermal Once  . multivitamin  1 tablet Oral QHS  . pantoprazole (PROTONIX) IV  40 mg Intravenous Q12H  . potassium chloride  10 mEq Intravenous Q1 Hr x 2  . sevelamer  1,600 mg Oral TID WC  . simvastatin  5 mg Oral QHS  . sodium chloride  3 mL Intravenous Q12H  . DISCONTD: pantoprazole (PROTONIX) IV  40 mg Intravenous Once    have reviewed scheduled and prn medications.  Physical Exam: General: chronically ill appearing in air bed.  NAD Heart: RRR Lungs: anteriorly clear Abdomen: soft, nontender Extremities: Left groin wound with VAC in place, L TMA with VAC in place. No LE edema  Dialysis Access: L sided chest catheter.    Summary Pt is a 61 y.o. yo male with ESRD who was admitted on 10/29/2011 with  Poorly healing left transmet wound and continued drainage of left groin wound.    Assessment/Plan:  1. GI bleed 8/14, severe esophagitis on EGD- felt to be source of bleed by GI.  Hb up after 3u prbc 8/14. SQ hep stopped. No hep with HD. 2. 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   3. ESRD- [facility] MWF via PC.  Continue usual HD. Looking dry, no UF with HD today. 4. Anemia due to CKD and GI bleed.-   Giving aranesp, no iron due to high ferritin  5. Secondary hyperparathyroidism- phos 7.1, calc 9.3, adj Ca 11.3. PTH 278, not on vit D or any Ca products. High Ca due to immobilization most likely. Renvela started.  6. HTN/volume- variable but  mostly reasonable.  No BP meds for now. 7. DM-hypoglycemia- per primary team.  8. Dispo- possible LTAC placement when stable.   Vinson Moselle  MD [facility] [phone] pgr    [phone] cell 11/07/2011, 9:31 AM

## 2011-11-11 ENCOUNTER — Encounter (HOSPITAL_BASED_OUTPATIENT_CLINIC_OR_DEPARTMENT_OTHER): Payer: Medicare Other | Attending: General Surgery

## 2011-11-11 DIAGNOSIS — Z992 Dependence on renal dialysis: Secondary | ICD-10-CM | POA: Insufficient documentation

## 2011-11-11 DIAGNOSIS — L8990 Pressure ulcer of unspecified site, unspecified stage: Secondary | ICD-10-CM | POA: Insufficient documentation

## 2011-11-11 DIAGNOSIS — N189 Chronic kidney disease, unspecified: Secondary | ICD-10-CM | POA: Insufficient documentation

## 2011-11-11 DIAGNOSIS — Z79899 Other long term (current) drug therapy: Secondary | ICD-10-CM | POA: Insufficient documentation

## 2011-11-11 DIAGNOSIS — E109 Type 1 diabetes mellitus without complications: Secondary | ICD-10-CM | POA: Insufficient documentation

## 2011-11-11 DIAGNOSIS — L89109 Pressure ulcer of unspecified part of back, unspecified stage: Secondary | ICD-10-CM | POA: Insufficient documentation

## 2011-11-11 DIAGNOSIS — S98919A Complete traumatic amputation of unspecified foot, level unspecified, initial encounter: Secondary | ICD-10-CM | POA: Insufficient documentation

## 2011-11-11 DIAGNOSIS — I129 Hypertensive chronic kidney disease with stage 1 through stage 4 chronic kidney disease, or unspecified chronic kidney disease: Secondary | ICD-10-CM | POA: Insufficient documentation

## 2011-11-11 LAB — CULTURE, BLOOD (ROUTINE X 2): Culture: NO GROWTH

## 2011-12-10 ENCOUNTER — Other Ambulatory Visit (HOSPITAL_COMMUNITY): Payer: Self-pay | Admitting: Nephrology

## 2011-12-10 DIAGNOSIS — N186 End stage renal disease: Secondary | ICD-10-CM

## 2011-12-12 ENCOUNTER — Encounter (HOSPITAL_COMMUNITY): Payer: Self-pay

## 2011-12-12 ENCOUNTER — Ambulatory Visit (HOSPITAL_COMMUNITY)
Admission: RE | Admit: 2011-12-12 | Discharge: 2011-12-12 | Disposition: A | Discharge status: Home / Self Care | Payer: Medicare Other | Source: Ambulatory Visit | Attending: Nephrology | Admitting: Nephrology

## 2011-12-12 DIAGNOSIS — T82598A Other mechanical complication of other cardiac and vascular devices and implants, initial encounter: Secondary | ICD-10-CM | POA: Insufficient documentation

## 2011-12-12 DIAGNOSIS — N186 End stage renal disease: Secondary | ICD-10-CM

## 2011-12-12 DIAGNOSIS — Y841 Kidney dialysis as the cause of abnormal reaction of the patient, or of later complication, without mention of misadventure at the time of the procedure: Secondary | ICD-10-CM | POA: Insufficient documentation

## 2011-12-12 DIAGNOSIS — Z992 Dependence on renal dialysis: Secondary | ICD-10-CM | POA: Insufficient documentation

## 2011-12-12 DIAGNOSIS — I120 Hypertensive chronic kidney disease with stage 5 chronic kidney disease or end stage renal disease: Secondary | ICD-10-CM | POA: Insufficient documentation

## 2011-12-12 MED ORDER — CEFAZOLIN SODIUM 1-5 GM-% IV SOLN
INTRAVENOUS | Status: AC
  Filled 2011-12-12: qty 50

## 2011-12-12 MED ORDER — CHLORHEXIDINE GLUCONATE 4 % EX LIQD
CUTANEOUS | Status: AC
  Filled 2011-12-12: qty 30

## 2011-12-12 MED ORDER — SODIUM CHLORIDE 0.9 % IV SOLN: Freq: Once | INTRAVENOUS | Status: DC

## 2011-12-12 MED ORDER — HEPARIN SODIUM (PORCINE) 1000 UNIT/ML IJ SOLN
INTRAMUSCULAR | Status: AC
  Filled 2011-12-12: qty 1

## 2011-12-12 MED ORDER — CEFAZOLIN SODIUM 1-5 GM-% IV SOLN
1.0000 g | Freq: Once | INTRAVENOUS | Status: AC
  Administered 2011-12-12: 1 g via INTRAVENOUS

## 2011-12-12 NOTE — Procedures (Signed)
Successful fluoroscopic guided exchange of existing left jugular approach dialysis catheter.  Catheter is ready for immediate use.  No immediate post procedural complications.   

## 2011-12-12 NOTE — H&P (Signed)
[name] is an 61 y.o. male.   Chief Complaint: left IJ HD cath not working well Used in dialysis 9/18; sluggish at first then worked better later in session Increased arterial pressure per note from MD Scheduled for exchange HPI: HLD; ESRD; CVA; anemia; PVD; HTN; Cataracts  Past Medical History  Diagnosis Date  . Hyperlipidemia   . ESRD (end stage renal disease)     a. MWF dialysis in [place] (followed by Dr. Kristian [name])  . Arthritis   . Claudication   . Stroke 2005  . Anemia   . Chronic diastolic CHF (congestive heart failure) 07/2010    a. 07/2008 Echo EF 50-55%, mild-mod LVH, Gr 1 DD.  Marland Kitchen Peripheral vascular occlusive disease     a. 07/2011 Periph Angio: No signif Ao-illiac dzs, LCF stenosis, 100% LSFA w recon above knee pop and 3 vessel runoff, 100% RSFA w/ recon above knee --> pending L Fem to below Knee pop bypass.  Marland Kitchen History of tobacco abuse   . Hypertension     takes Amlodipine,Metoprolol,and Prinivil daily  . Dyspnea on exertion     with exertion  . GERD (gastroesophageal reflux disease)   . Cataract     bilateral  . Dialysis patient   . PUD (peptic ulcer disease) mid 1990's    with GI bleed.  endoscoped in mid 1990's at [facility]    Past Surgical History  Procedure Date  . Arteriovenous graft placement   . Av fistula placement 12/10/2000    Right brachiocephalic arteriovenous   . Hernia repair   . Aortagram 07/29/2011    Abdominal Aortagram  . Cardiac catheterization 08/01/11    Left heart catheterization  . Femoral-popliteal bypass graft 09/09/2011    Procedure: BYPASS GRAFT FEMORAL-POPLITEAL ARTERY;  Surgeon: Earl Libman, MD;  Location: [facility] OR;  Service: Vascular;  Laterality: Left;  . Amputation 09/12/2011    Procedure: AMPUTATION BELOW KNEE;  Surgeon: Earl Libman, MD;  Location: [facility] OR;  Service: Vascular;  Laterality: Left;  TRANSMETATARSAL  . Application of wound vac 10/30/2011    Procedure: APPLICATION OF WOUND VAC;  Surgeon: Earl Libman,  MD;  Location: [facility] OR;  Service: Vascular;  Laterality: Left;  . Groin debridement 11/01/2011    Procedure: [name] DEBRIDEMENT;  Surgeon: Earl Hint, MD;  Location: [facility] OR;  Service: Vascular;  Laterality: Left;  . Esophagogastroduodenoscopy 11/06/2011    Procedure: ESOPHAGOGASTRODUODENOSCOPY (EGD);  Surgeon: Earl Fiedler, MD;  Location: [facility] ENDOSCOPY;  Service: Gastroenterology;  Laterality: N/A;    Family History  Problem Relation Age of Onset  . Breast cancer Mother   . Cancer Father   . Anesthesia problems Neg Hx   . Hypotension Neg Hx   . Malignant hyperthermia Neg Hx   . Pseudochol deficiency Neg Hx    Social History:  reports that he quit smoking about 8 months ago. His smoking use included Cigarettes. He has a 40 pack-year smoking history. He does not have any smokeless tobacco history on file. He reports that he does not drink alcohol or use illicit drugs.  Allergies: No Known Allergies   (Not in a hospital admission)  No results found for this or any previous visit (from the past 48 hour(s)). No results found.  Review of Systems  Constitutional: Negative for fever and chills.  Respiratory: Negative for cough and shortness of breath.   Cardiovascular: Negative for chest pain.  Gastrointestinal: Negative for nausea and vomiting.  Neurological: Positive for weakness. Negative  for headaches.    Blood pressure 149/73, temperature 97.8 F (36.6 C), temperature source Oral, resp. rate 18, SpO2 100.00%. Physical Exam  Constitutional: He is oriented to person, place, and time.  Neck:       Left perm cath in place  Cardiovascular: Normal rate, regular rhythm and normal heart sounds.   Respiratory: Effort normal and breath sounds normal. He has no wheezes.  GI: Soft. Bowel sounds are normal. There is no tenderness.  Musculoskeletal: Normal range of motion.  Neurological: He is alert and oriented to person, place, and time.     Assessment/Plan Left IJ Hemodialysis  catheter in place incr arterial pressure per renal MD while in dialysis 9/18 Scheduled now for catheter exchange vs replacement Pt aware of procedure benefits and risks and agreeable to proceed Consent signed and in chart  [name] A 12/12/2011, 11:56 AM

## 2011-12-18 ENCOUNTER — Ambulatory Visit: Payer: Medicare Other | Admitting: Gastroenterology

## 2011-12-24 ENCOUNTER — Encounter: Payer: Self-pay | Admitting: Vascular Surgery

## 2011-12-25 ENCOUNTER — Encounter (INDEPENDENT_AMBULATORY_CARE_PROVIDER_SITE_OTHER): Payer: Medicare Other | Admitting: *Deleted

## 2011-12-25 ENCOUNTER — Encounter: Payer: Self-pay | Admitting: Vascular Surgery

## 2011-12-25 ENCOUNTER — Ambulatory Visit (INDEPENDENT_AMBULATORY_CARE_PROVIDER_SITE_OTHER): Payer: Medicare Other | Admitting: Vascular Surgery

## 2011-12-25 ENCOUNTER — Encounter (HOSPITAL_COMMUNITY): Payer: Self-pay | Admitting: Pharmacy Technician

## 2011-12-25 VITALS — BP 157/61 | HR 64 | Temp 97.7°F | Ht 66.0 in | Wt 126.0 lb

## 2011-12-25 DIAGNOSIS — Z48812 Encounter for surgical aftercare following surgery on the circulatory system: Secondary | ICD-10-CM | POA: Insufficient documentation

## 2011-12-25 DIAGNOSIS — I739 Peripheral vascular disease, unspecified: Secondary | ICD-10-CM

## 2011-12-25 DIAGNOSIS — L98499 Non-pressure chronic ulcer of skin of other sites with unspecified severity: Secondary | ICD-10-CM

## 2011-12-25 DIAGNOSIS — I7025 Atherosclerosis of native arteries of other extremities with ulceration: Secondary | ICD-10-CM | POA: Insufficient documentation

## 2011-12-25 DIAGNOSIS — T8189XA Other complications of procedures, not elsewhere classified, initial encounter: Secondary | ICD-10-CM

## 2011-12-25 NOTE — Progress Notes (Signed)
Patient is a 61 year old male referred by Dr. Tanda Rockers for evaluation of early gangrenous changes right first toe. The patient previously has undergone a left femoral to popliteal bypass by Dr. Myra Gianotti in June of this year. He also had a left transmetatarsal amputation and revision of this around the same time. The patient has had a very rocky course postoperatively with a seroma in the left groin and hospital admission for sepsis. He also had a stage IV sacral decubitus that developed. He states that this is healing. He is on chronic hemodialysis Monday Wednesday and Friday. He is currently dialyzing via a left-sided catheter. He states that he has pain in his transmetatarsal amputation site as well as his right first toe. He denies fever or chills. He is minimally ambulatory due to deconditioning. He is able to stand and walk a few steps.  Review of systems:  He has shortness of breath with minimal exertion. He denies chest pain.  Physical exam: Filed Vitals:   12/25/11 1231  BP: 157/61  Pulse: 64  Temp: 97.7 F (36.5 C)  TempSrc: Oral  Height: 5\' 6"  (1.676 m)  Weight: 126 lb (57.153 kg)   General: Thin male no acute distress appears chronically ill Lower extremities: 2+ femoral pulses bilaterally, right first toe Cool with early gangrenous changes, healing left transmetatarsal amputation with good granulation tissue. 3 cm left heel ulcer also slowly healing  Data: Patient had bilateral lower extremity arterial duplex exam today. This showed his left femoropopliteal bypass is patent with no stenosis. He has significant atherosclerotic plaque and occlusion of his right superficial femoral artery. I reviewed his arteriogram from approximately 5 months ago. This showed a flush right superficial femoral artery occlusion with reconstitution of the popliteal artery and three-vessel runoff. He had similar findings on the left side.  Assessment: Early gangrene right first toe, healing left  transmetatarsal amputation, overall fairly deconditioned patient.  Plan: Since the patient has had progressive changes in his right lower extremity I believe his arteriogram needs to be repeated. He may need revascularization salvage his right foot. I scheduled the arteriogram with Dr. Myra Gianotti. Since Dr. Myra Gianotti is well aquainted with the patient he will be better able to make a decision regarding whether or not the patient is currently in reasonable shape to undergo bypass procedure. The patient will continue local wound care to his transmetatarsal amputation and a dry dressing to the right first toe. He was given a prescription today for Percocet for pain.  His arteriogram is scheduled for October 8.  Fabienne Bruns, MD [facility] Office: [phone] Pager: [phone]

## 2011-12-26 ENCOUNTER — Other Ambulatory Visit: Payer: Self-pay

## 2011-12-29 MED ORDER — SODIUM CHLORIDE 0.9 % IJ SOLN: 3.0000 mL | INTRAMUSCULAR | Status: DC | PRN

## 2011-12-30 ENCOUNTER — Other Ambulatory Visit (HOSPITAL_COMMUNITY): Payer: Medicare Other

## 2011-12-30 ENCOUNTER — Ambulatory Visit (HOSPITAL_COMMUNITY)
Admission: RE | Admit: 2011-12-30 | Discharge: 2011-12-30 | Disposition: A | Discharge status: Home / Self Care | Payer: Medicare Other | Source: Ambulatory Visit | Attending: Surgery | Admitting: Surgery

## 2011-12-30 ENCOUNTER — Ambulatory Visit (HOSPITAL_COMMUNITY): Payer: Medicare Other

## 2011-12-30 ENCOUNTER — Encounter (HOSPITAL_COMMUNITY)
Admission: RE | Disposition: A | Discharge status: Home / Self Care | Payer: Self-pay | Source: Ambulatory Visit | Attending: Surgery

## 2011-12-30 ENCOUNTER — Other Ambulatory Visit: Payer: Self-pay | Admitting: *Deleted

## 2011-12-30 DIAGNOSIS — I739 Peripheral vascular disease, unspecified: Secondary | ICD-10-CM | POA: Insufficient documentation

## 2011-12-30 DIAGNOSIS — L98499 Non-pressure chronic ulcer of skin of other sites with unspecified severity: Secondary | ICD-10-CM | POA: Insufficient documentation

## 2011-12-30 DIAGNOSIS — Z992 Dependence on renal dialysis: Secondary | ICD-10-CM | POA: Insufficient documentation

## 2011-12-30 DIAGNOSIS — L97509 Non-pressure chronic ulcer of other part of unspecified foot with unspecified severity: Secondary | ICD-10-CM | POA: Insufficient documentation

## 2011-12-30 DIAGNOSIS — S98919A Complete traumatic amputation of unspecified foot, level unspecified, initial encounter: Secondary | ICD-10-CM | POA: Insufficient documentation

## 2011-12-30 DIAGNOSIS — I70219 Atherosclerosis of native arteries of extremities with intermittent claudication, unspecified extremity: Secondary | ICD-10-CM

## 2011-12-30 HISTORY — PX: ABDOMINAL AORTAGRAM: SHX5454

## 2011-12-30 LAB — POCT I-STAT, CHEM 8
Calcium, Ion: 1.09 mmol/L — ABNORMAL LOW (ref 1.13–1.30)
Creatinine, Ser: 4.7 mg/dL — ABNORMAL HIGH (ref 0.50–1.35)
Glucose, Bld: 66 mg/dL — ABNORMAL LOW (ref 70–99)
Hemoglobin: 14.3 g/dL (ref 13.0–17.0)
TCO2: 23 mmol/L (ref 0–100)

## 2011-12-30 LAB — CBC
HCT: 41 % (ref 39.0–52.0)
MCH: 29.9 pg (ref 26.0–34.0)
MCV: 93 fL (ref 78.0–100.0)
Platelets: 200 10*3/uL (ref 150–400)
RBC: 4.41 MIL/uL (ref 4.22–5.81)
RDW: 18.3 % — ABNORMAL HIGH (ref 11.5–15.5)

## 2011-12-30 LAB — COMPREHENSIVE METABOLIC PANEL
AST: 16 U/L (ref 0–37)
BUN: 24 mg/dL — ABNORMAL HIGH (ref 6–23)
CO2: 23 mEq/L (ref 19–32)
Calcium: 8.7 mg/dL (ref 8.4–10.5)
Creatinine, Ser: 4.68 mg/dL — ABNORMAL HIGH (ref 0.50–1.35)
GFR calc non Af Amer: 12 mL/min — ABNORMAL LOW (ref >90)
Total Bilirubin: 0.2 mg/dL — ABNORMAL LOW (ref 0.3–1.2)

## 2011-12-30 LAB — TYPE AND SCREEN
ABO/RH(D): O POS
Antibody Screen: NEGATIVE

## 2011-12-30 LAB — SURGICAL PCR SCREEN: MRSA, PCR: POSITIVE — AB

## 2011-12-30 LAB — PROTIME-INR: Prothrombin Time: 14.7 seconds (ref 11.6–15.2)

## 2011-12-30 SURGERY — ABDOMINAL AORTAGRAM
Anesthesia: LOCAL

## 2011-12-30 MED ORDER — METOPROLOL TARTRATE 1 MG/ML IV SOLN: 2.0000 mg | INTRAVENOUS | Status: DC | PRN

## 2011-12-30 MED ORDER — PHENOL 1.4 % MT LIQD: 1.0000 | OROMUCOSAL | Status: DC | PRN

## 2011-12-30 MED ORDER — OXYCODONE-ACETAMINOPHEN 5-325 MG PO TABS
1.0000 | ORAL_TABLET | ORAL | Status: DC | PRN
  Administered 2011-12-30: 2 via ORAL

## 2011-12-30 MED ORDER — FENTANYL CITRATE 0.05 MG/ML IJ SOLN
INTRAMUSCULAR | Status: AC
  Filled 2011-12-30: qty 2

## 2011-12-30 MED ORDER — LIDOCAINE HCL (PF) 1 % IJ SOLN
INTRAMUSCULAR | Status: AC
  Filled 2011-12-30: qty 30

## 2011-12-30 MED ORDER — HYDRALAZINE HCL 20 MG/ML IJ SOLN
INTRAMUSCULAR | Status: AC
  Filled 2011-12-30: qty 1

## 2011-12-30 MED ORDER — ACETAMINOPHEN 325 MG RE SUPP: 325.0000 mg | RECTAL | Status: DC | PRN

## 2011-12-30 MED ORDER — MIDAZOLAM HCL 2 MG/2ML IJ SOLN
INTRAMUSCULAR | Status: AC
  Filled 2011-12-30: qty 2

## 2011-12-30 MED ORDER — HYDRALAZINE HCL 20 MG/ML IJ SOLN: 10.0000 mg | INTRAMUSCULAR | Status: DC | PRN

## 2011-12-30 MED ORDER — LABETALOL HCL 5 MG/ML IV SOLN: 10.0000 mg | INTRAVENOUS | Status: DC | PRN

## 2011-12-30 MED ORDER — HEPARIN (PORCINE) IN NACL 2-0.9 UNIT/ML-% IJ SOLN
INTRAMUSCULAR | Status: AC
  Filled 2011-12-30: qty 500

## 2011-12-30 MED ORDER — ACETAMINOPHEN 325 MG PO TABS: 325.0000 mg | ORAL_TABLET | ORAL | Status: DC | PRN

## 2011-12-30 MED ORDER — ALUM & MAG HYDROXIDE-SIMETH 200-200-20 MG/5ML PO SUSP: 15.0000 mL | ORAL | Status: DC | PRN

## 2011-12-30 MED ORDER — GUAIFENESIN-DM 100-10 MG/5ML PO SYRP: 15.0000 mL | ORAL_SOLUTION | ORAL | Status: DC | PRN

## 2011-12-30 MED ORDER — OXYCODONE-ACETAMINOPHEN 5-325 MG PO TABS
ORAL_TABLET | ORAL | Status: AC
  Filled 2011-12-30: qty 2

## 2011-12-30 MED ORDER — MORPHINE SULFATE 4 MG/ML IJ SOLN
INTRAMUSCULAR | Status: AC
  Administered 2011-12-30: 4 mg
  Filled 2011-12-30: qty 1

## 2011-12-30 MED ORDER — SODIUM CHLORIDE 0.9 % IV SOLN: INTRAVENOUS | Status: DC

## 2011-12-30 MED ORDER — ONDANSETRON HCL 4 MG/2ML IJ SOLN: 4.0000 mg | Freq: Four times a day (QID) | INTRAMUSCULAR | Status: DC | PRN

## 2011-12-30 MED ORDER — MORPHINE SULFATE 10 MG/ML IJ SOLN: 2.0000 mg | INTRAMUSCULAR | Status: DC | PRN

## 2011-12-30 MED ORDER — DEXTROSE 5 % IV SOLN: 1.5000 g | INTRAVENOUS | Status: DC

## 2011-12-30 NOTE — Pre-Procedure Instructions (Signed)
20 ANSELM AUMILLER  12/30/2011   Your procedure is scheduled on: Friday, October 11th.  Report to [facility] at 7:30 AM.  Call this number if you have problems the morning of surgery: [phone]   Remember:   Do not eat food or drink any liquid:After Midnight.     Take these medicines the morning of surgery with A SIP OF WATER: Aspirin, Gabapentin (Neurontin),  Hydralazine (Apresoline), Isosorbide (Imdur)   Do not wear jewelry, make-up or nail polish.  Do not wear lotions, powders, or perfumes. You may wear deodorant.  Do not shave 48 hours prior to surgery. Men may shave face and neck.  Do not bring valuables to the hospital.  Contacts, dentures or bridgework may not be worn into surgery.  Leave suitcase in the car. After surgery it may be brought to your room.  For patients admitted to the hospital, checkout time is 11:00 AM the day of discharge.   Patients discharged the day of surgery will not be allowed to drive home.  Name and phone number of your driver: NA  Special Instructions: Shower using CHG 2 nights before surgery and the night before surgery.  If you shower the day of surgery use CHG.  Use special wash - you have one bottle of CHG for all showers.  You should use approximately 1/3 of the bottle for each shower.   Please read over the following fact sheets that you were given: Pain Booklet, Coughing and Deep Breathing, Blood Transfusion Information and Surgical Site Infection Prevention

## 2011-12-30 NOTE — H&P (View-Only) (Signed)
Patient is a 61-year-old male referred by Dr. Nichols for evaluation of early gangrenous changes right first toe. The patient previously has undergone a left femoral to popliteal bypass by Dr. Brabham in June of this year. He also had a left transmetatarsal amputation and revision of this around the same time. The patient has had a very rocky course postoperatively with a seroma in the left groin and hospital admission for sepsis. He also had a stage IV sacral decubitus that developed. He states that this is healing. He is on chronic hemodialysis Monday Wednesday and Friday. He is currently dialyzing via a left-sided catheter. He states that he has pain in his transmetatarsal amputation site as well as his right first toe. He denies fever or chills. He is minimally ambulatory due to deconditioning. He is able to stand and walk a few steps.  Review of systems:  He has shortness of breath with minimal exertion. He denies chest pain.  Physical exam: Filed Vitals:   12/25/11 1231  BP: 157/61  Pulse: 64  Temp: 97.7 F (36.5 C)  TempSrc: Oral  Height: 5' 6" (1.676 m)  Weight: 126 lb (57.153 kg)   General: Thin male no acute distress appears chronically ill Lower extremities: 2+ femoral pulses bilaterally, right first toe Cool with early gangrenous changes, healing left transmetatarsal amputation with good granulation tissue. 3 cm left heel ulcer also slowly healing  Data: Patient had bilateral lower extremity arterial duplex exam today. This showed his left femoropopliteal bypass is patent with no stenosis. He has significant atherosclerotic plaque and occlusion of his right superficial femoral artery. I reviewed his arteriogram from approximately 5 months ago. This showed a flush right superficial femoral artery occlusion with reconstitution of the popliteal artery and three-vessel runoff. He had similar findings on the left side.  Assessment: Early gangrene right first toe, healing left  transmetatarsal amputation, overall fairly deconditioned patient.  Plan: Since the patient has had progressive changes in his right lower extremity I believe his arteriogram needs to be repeated. He may need revascularization salvage his right foot. I scheduled the arteriogram with Dr. Brabham. Since Dr. Brabham is well aquainted with the patient he will be better able to make a decision regarding whether or not the patient is currently in reasonable shape to undergo bypass procedure. The patient will continue local wound care to his transmetatarsal amputation and a dry dressing to the right first toe. He was given a prescription today for Percocet for pain.  His arteriogram is scheduled for October 8.  Christobal Morado, MD [facility] Office: [phone] Pager: [phone]    

## 2011-12-30 NOTE — Op Note (Signed)
[facility]  Patient name: [name] MRN: [id number] DOB: [birth year] Sex: male  12/30/2011 Pre-operative Diagnosis: Right foot ulcer Post-operative diagnosis:  Same Surgeon:  Jorge Ny Procedure Performed:  1.  ultrasound access, left femoral artery  2.  abdominal aortogram  3.  bilateral lower extremity runoff  4.  second order catheterization   Indications:  The patient is status post left femoral to below-knee popliteal bypass and transmetatarsal amputation. He was recently found to have a right foot ulcer and comes in today for arteriogram  Procedure:  The patient was identified in the holding area and taken to room 8.  The patient was then placed supine on the table and prepped and draped in the usual sterile fashion.  A time out was called.  Ultrasound was used to evaluate the left common femoral artery.  It was patent .  A digital ultrasound image was acquired.  A micropuncture needle was used to access the left common femoral artery under ultrasound guidance.  An 018 wire was advanced without resistance and a micropuncture sheath was placed.  The 018 wire was removed and a benson wire was placed.  The micropuncture sheath was exchanged for a 5 french sheath.  An omniflush catheter was advanced over the wire to the level of L-1.  An abdominal angiogram was obtained.  Next, using the omniflush catheter and a benson wire, followed by a 4 French endhole cath, the aortic bifurcation was crossed and the catheter was placed into theright external iliac artery and right runoff was obtained.  left runoff was performed via retrograde sheath injections.  Findings:   Aortogram:  The visualized portions of the suprarenal abdominal aorta showed no significant disease. The infrarenal abdominal aorta is heavily calcified without significant stenosis. Bilateral common and external iliac arteries are heavily calcified without significant stenosis.  Right  Lower Extremity:  The right common femoral artery is widely patent. The right profunda femoral artery is widely patent. The right superficial femoral artery is diminutive at its origin and then it occludes. There is reconstitution within the adductor canal. There is three-vessel runoff however the anterior tibial and posterior tibial artery are very diseased.  Left Lower Extremity:  Patulous changes to the left common femoral artery are observed likely secondary to previous bypass. The profunda femoral artery is patent throughout it's course. There is a left femoral to below knee popliteal artery bypass graft which is widely patent. There is three-vessel runoff via 3 diseased and small tibial vessels.  Intervention:  None  Impression:  #1  widely patent left femoral to below knee popliteal bypass  #2  right superficial femoral artery occlusion with reconstitution of the above-knee popliteal artery and three-vessel runoff via a diseased tibial vessels   #3  Due to the difficulty crossing the aortic bifurcation and the heavily calcification I do not feel the patient is a good candidate for recanalization. Therefore I recommended proceeding with surgical revascularization.     Juleen China, M.D. [facility] Office: [phone] Pager:  [phone]

## 2011-12-30 NOTE — Interval H&P Note (Signed)
History and Physical Interval Note:  12/30/2011 9:41 AM  [name]  has presented today for surgery, with the diagnosis of PVD  The various methods of treatment have been discussed with the patient and family. After consideration of risks, benefits and other options for treatment, the patient has consented to  Procedure(s) (LRB) with comments: ABDOMINAL AORTAGRAM (N/A) as a surgical intervention .  The patient's history has been reviewed, patient examined, no change in status, stable for surgery.  I have reviewed the patient's chart and labs.  Questions were answered to the patient's satisfaction.     Codey Burling IV, V. WELLS

## 2011-12-31 ENCOUNTER — Other Ambulatory Visit: Payer: Self-pay | Admitting: *Deleted

## 2011-12-31 NOTE — Consult Note (Signed)
Anesthesia Chart Review:  Patient is a 61 year old male scheduled for right femoral-popliteal bypass graft on 01/02/2012 by Dr. Darrick Penna.  Patient had his PAT visit in Short Stay section C following an arterigram.  History includes left FPBG (09/09/11) and left TMA (10/30/11) complicated by post-operative left groin seroma and stage IV sacral decubitus, hospital acquired PNA, and C. difficile colitis with sepsis, ESRD on HD MWF, chronic diastolic CHF, right pleural effusion 07/2011, HTN, HLD, CVA '05, PUD '90s, anemia, smoking.  He has been evaluated by [facility] in the past and underwent cardiac cath in May 2013 (50% left main and ostial LAD, EF 50%) due to an abnormal stress test.  PCP is Dr. Lia Hopping.  EKG on 11/05/11 showed NSR, incomplete left BBB, anterolateral ST/T wave abnormality, consider ischemia, prolonged QT. His T wave abnormality is more pronounced since his last EKG on 10/11/11, but has been more prominent as well on EKGs dating back to at least 07/31/10.  Echo on 08/15/11 showed: - Left ventricle: LVEF is approximately 50% with inferior/inferoseptal hypokinesis. The cavity size was normal. Wall thickness was increased in a pattern of mild LVH. Systolic function was normal. The estimated ejection fraction was in the range of 50% to 55%. - Right atrium: The atrium was mildly dilated. - Trivial MR/TR.  On 07/31/11 he had an abnormal stress nuclear study. Large, severe primarily fixed basal to mid inferior perfusion defect suggests prior infarction with peri-infarction ischemia. LV Ejection Fraction: 44%. LV Wall Motion: Inferior hypokinesis.  This lead to a cardiac cath (see below).  Coronary angiography on 08/01/11:  Coronary dominance: Right  Left mainstem: Heavily calcified. Distal 50%.  Left anterior descending (LAD): Heavy calcification. Ostial 50%. Large vessel wrapping the apex. D1 moderate and normal.  Left circumflex (LCx): Large vessel. AV groove normal. OM1 large and  luminal irregularities. OM2 moderate and normal. PL large and branching and normal.  Right coronary artery (RCA): Dominant. Long subtotal chronic occlusion with TIMI 1 flow. Distal vessel with scant left to right collaterals.  Left ventriculography: Left ventricular systolic function is decreased, LVEF is estimated at 50 with moderate inferior akinesis, there is no significant mitral regurgitation  Final Conclusions: Single vessel obstructive CAD. Moderate nonobstructive plaque elsewhere. Mild LV dysfunction.  Recommendations: Medical management and risk factor modification. The patient will be at acceptable risk for the planned vascular surgery according to ACC/AHA guidelines. (Dr. Jenene Slicker recommendations in reference to patient's left FPBG that was done in June of 2013.)   His last 2V CXR on 07/31/11 showed a moderate size right pleural effusion and right lower lobe airspace consolidation.  A 1V CXR on 08/13/11 showed improved left airspace disease.  Stable airspace disease throughout the right lung with a small right pleural effusion.  Will order a follow-up CXR to be done on the day of surgery to re-evaluate.  Labs noted.  ISTAT on arrival.   Patient has 1V CAD and was cleared by cardiology prior to his previous FPBG in June 2013.  He tolerated that procedure although with a complicated post-operative course that did not seem to be related to his coronary artery disease.  If his follow-up testing is reasonable and he has no acute/new CV/CHF symptoms then would anticipate that he could proceed as planned.  Shonna Chock, PA-C

## 2012-01-01 LAB — POCT I-STAT, CHEM 8
Creatinine, Ser: 4.5 mg/dL — ABNORMAL HIGH (ref 0.50–1.35)
Glucose, Bld: 71 mg/dL (ref 70–99)
HCT: 42 % (ref 39.0–52.0)
Hemoglobin: 14.3 g/dL (ref 13.0–17.0)
TCO2: 25 mmol/L (ref 0–100)

## 2012-01-01 MED ORDER — DEXTROSE 5 % IV SOLN
1.5000 g | INTRAVENOUS | Status: AC
  Administered 2012-01-02: 1.5 g via INTRAVENOUS
  Filled 2012-01-01: qty 1.5

## 2012-01-01 NOTE — Progress Notes (Signed)
NOTIFIED PATIENT TO ARRIVE AT 700 AM ON 01/02/12.

## 2012-01-02 ENCOUNTER — Encounter (HOSPITAL_COMMUNITY)
Admission: RE | Disposition: A | Discharge status: Home Health | Payer: Self-pay | Source: Ambulatory Visit | Attending: Surgery

## 2012-01-02 ENCOUNTER — Inpatient Hospital Stay (HOSPITAL_COMMUNITY)
Admission: RE | Admit: 2012-01-02 | Discharge: 2012-01-05 | DRG: 239 | Disposition: A | Discharge status: Home Health | Payer: Medicare Other | Source: Ambulatory Visit | Attending: Surgery | Admitting: Surgery

## 2012-01-02 ENCOUNTER — Encounter (HOSPITAL_COMMUNITY): Payer: Self-pay | Admitting: Vascular Surgery

## 2012-01-02 ENCOUNTER — Ambulatory Visit (HOSPITAL_COMMUNITY): Payer: Medicare Other | Admitting: Vascular Surgery

## 2012-01-02 ENCOUNTER — Ambulatory Visit (HOSPITAL_COMMUNITY): Payer: Medicare Other

## 2012-01-02 ENCOUNTER — Encounter (HOSPITAL_COMMUNITY): Payer: Self-pay | Admitting: *Deleted

## 2012-01-02 DIAGNOSIS — F17200 Nicotine dependence, unspecified, uncomplicated: Secondary | ICD-10-CM | POA: Diagnosis present

## 2012-01-02 DIAGNOSIS — N186 End stage renal disease: Secondary | ICD-10-CM | POA: Diagnosis present

## 2012-01-02 DIAGNOSIS — Z992 Dependence on renal dialysis: Secondary | ICD-10-CM

## 2012-01-02 DIAGNOSIS — L98499 Non-pressure chronic ulcer of skin of other sites with unspecified severity: Principal | ICD-10-CM | POA: Diagnosis present

## 2012-01-02 DIAGNOSIS — N2581 Secondary hyperparathyroidism of renal origin: Secondary | ICD-10-CM | POA: Diagnosis present

## 2012-01-02 DIAGNOSIS — D649 Anemia, unspecified: Secondary | ICD-10-CM | POA: Diagnosis present

## 2012-01-02 DIAGNOSIS — I739 Peripheral vascular disease, unspecified: Principal | ICD-10-CM | POA: Diagnosis present

## 2012-01-02 DIAGNOSIS — L97809 Non-pressure chronic ulcer of other part of unspecified lower leg with unspecified severity: Secondary | ICD-10-CM | POA: Diagnosis present

## 2012-01-02 DIAGNOSIS — I70269 Atherosclerosis of native arteries of extremities with gangrene, unspecified extremity: Secondary | ICD-10-CM

## 2012-01-02 DIAGNOSIS — I5032 Chronic diastolic (congestive) heart failure: Secondary | ICD-10-CM | POA: Diagnosis present

## 2012-01-02 DIAGNOSIS — I70219 Atherosclerosis of native arteries of extremities with intermittent claudication, unspecified extremity: Secondary | ICD-10-CM

## 2012-01-02 DIAGNOSIS — I509 Heart failure, unspecified: Secondary | ICD-10-CM | POA: Diagnosis present

## 2012-01-02 DIAGNOSIS — Z8673 Personal history of transient ischemic attack (TIA), and cerebral infarction without residual deficits: Secondary | ICD-10-CM

## 2012-01-02 DIAGNOSIS — I120 Hypertensive chronic kidney disease with stage 5 chronic kidney disease or end stage renal disease: Secondary | ICD-10-CM | POA: Diagnosis present

## 2012-01-02 DIAGNOSIS — E785 Hyperlipidemia, unspecified: Secondary | ICD-10-CM | POA: Diagnosis present

## 2012-01-02 DIAGNOSIS — Z7982 Long term (current) use of aspirin: Secondary | ICD-10-CM

## 2012-01-02 HISTORY — PX: FEMORAL-POPLITEAL BYPASS GRAFT: SHX937

## 2012-01-02 HISTORY — PX: AMPUTATION: SHX166

## 2012-01-02 LAB — POCT I-STAT 4, (NA,K, GLUC, HGB,HCT): Hemoglobin: 13.6 g/dL (ref 13.0–17.0)

## 2012-01-02 SURGERY — BYPASS GRAFT FEMORAL-POPLITEAL ARTERY
Anesthesia: General | Site: Toe | Laterality: Right | Wound class: Clean

## 2012-01-02 MED ORDER — HEPARIN SODIUM (PORCINE) 1000 UNIT/ML IJ SOLN
INTRAMUSCULAR | Status: DC | PRN
  Administered 2012-01-02: 6000 [IU] via INTRAVENOUS
  Administered 2012-01-02: 1000 [IU] via INTRAVENOUS

## 2012-01-02 MED ORDER — SODIUM CHLORIDE 0.9 % IV SOLN
INTRAVENOUS | Status: DC | PRN
  Administered 2012-01-02 (×3): via INTRAVENOUS

## 2012-01-02 MED ORDER — DOCUSATE SODIUM 100 MG PO CAPS
100.0000 mg | ORAL_CAPSULE | Freq: Every day | ORAL | Status: DC
  Administered 2012-01-03 – 2012-01-05 (×3): 100 mg via ORAL
  Filled 2012-01-02 (×3): qty 1

## 2012-01-02 MED ORDER — LABETALOL HCL 5 MG/ML IV SOLN
10.0000 mg | INTRAVENOUS | Status: DC | PRN
  Administered 2012-01-03: 10 mg via INTRAVENOUS
  Filled 2012-01-02: qty 4

## 2012-01-02 MED ORDER — PHENOL 1.4 % MT LIQD
1.0000 | OROMUCOSAL | Status: DC | PRN
  Filled 2012-01-02: qty 177

## 2012-01-02 MED ORDER — LIDOCAINE HCL (CARDIAC) 20 MG/ML IV SOLN
INTRAVENOUS | Status: DC | PRN
  Administered 2012-01-02: 80 mg via INTRAVENOUS

## 2012-01-02 MED ORDER — 0.9 % SODIUM CHLORIDE (POUR BTL) OPTIME
TOPICAL | Status: DC | PRN
  Administered 2012-01-02 (×2): 1000 mL

## 2012-01-02 MED ORDER — ACETAMINOPHEN 650 MG RE SUPP: 325.0000 mg | RECTAL | Status: DC | PRN

## 2012-01-02 MED ORDER — LANTHANUM CARBONATE 500 MG PO CHEW
2000.0000 mg | CHEWABLE_TABLET | Freq: Three times a day (TID) | ORAL | Status: DC
  Administered 2012-01-02 – 2012-01-03 (×2): 2000 mg via ORAL
  Filled 2012-01-02 (×12): qty 4

## 2012-01-02 MED ORDER — ONDANSETRON HCL 4 MG/2ML IJ SOLN
INTRAMUSCULAR | Status: DC | PRN
  Administered 2012-01-02: 4 mg via INTRAVENOUS

## 2012-01-02 MED ORDER — ROCURONIUM BROMIDE 100 MG/10ML IV SOLN
INTRAVENOUS | Status: DC | PRN
  Administered 2012-01-02: 50 mg via INTRAVENOUS

## 2012-01-02 MED ORDER — CINACALCET HCL 30 MG PO TABS
30.0000 mg | ORAL_TABLET | Freq: Every day | ORAL | Status: DC
  Administered 2012-01-03 – 2012-01-05 (×3): 30 mg via ORAL
  Filled 2012-01-02 (×3): qty 1

## 2012-01-02 MED ORDER — SODIUM CHLORIDE 0.9 % IR SOLN
Status: DC | PRN
  Administered 2012-01-02: 11:00:00

## 2012-01-02 MED ORDER — PROPOFOL 10 MG/ML IV BOLUS
INTRAVENOUS | Status: DC | PRN
  Administered 2012-01-02: 170 mg via INTRAVENOUS

## 2012-01-02 MED ORDER — OXYCODONE HCL 5 MG PO TABS
ORAL_TABLET | ORAL | Status: AC
  Administered 2012-01-03: 10 mg via ORAL
  Filled 2012-01-02: qty 1

## 2012-01-02 MED ORDER — ONDANSETRON HCL 4 MG/2ML IJ SOLN: 4.0000 mg | Freq: Four times a day (QID) | INTRAMUSCULAR | Status: DC | PRN

## 2012-01-02 MED ORDER — SODIUM CHLORIDE 0.9 % IV SOLN
0.3000 ug/kg | INTRAVENOUS | Status: AC
  Administered 2012-01-02: 17.7 ug via INTRAVENOUS
  Filled 2012-01-02: qty 4.4

## 2012-01-02 MED ORDER — ASPIRIN 81 MG PO CHEW
81.0000 mg | CHEWABLE_TABLET | Freq: Every day | ORAL | Status: DC
  Administered 2012-01-03 – 2012-01-05 (×3): 81 mg via ORAL
  Filled 2012-01-02 (×3): qty 1

## 2012-01-02 MED ORDER — THROMBIN 20000 UNITS EX SOLR
CUTANEOUS | Status: AC
  Filled 2012-01-02: qty 20000

## 2012-01-02 MED ORDER — ONDANSETRON HCL 4 MG/2ML IJ SOLN
4.0000 mg | Freq: Four times a day (QID) | INTRAMUSCULAR | Status: DC | PRN
  Administered 2012-01-04: 4 mg via INTRAVENOUS
  Filled 2012-01-02: qty 2

## 2012-01-02 MED ORDER — SEVELAMER CARBONATE 800 MG PO TABS
1600.0000 mg | ORAL_TABLET | Freq: Three times a day (TID) | ORAL | Status: DC
  Administered 2012-01-02 – 2012-01-05 (×7): 1600 mg via ORAL
  Filled 2012-01-02 (×12): qty 2

## 2012-01-02 MED ORDER — SODIUM CHLORIDE 0.9 % IV SOLN
INTRAVENOUS | Status: DC
  Administered 2012-01-02: 09:00:00 via INTRAVENOUS

## 2012-01-02 MED ORDER — HYDRALAZINE HCL 25 MG PO TABS
25.0000 mg | ORAL_TABLET | Freq: Three times a day (TID) | ORAL | Status: DC
  Administered 2012-01-02 – 2012-01-05 (×8): 25 mg via ORAL
  Filled 2012-01-02 (×11): qty 1

## 2012-01-02 MED ORDER — OXYCODONE HCL 5 MG/5ML PO SOLN: 5.0000 mg | Freq: Once | ORAL | Status: AC | PRN

## 2012-01-02 MED ORDER — MIDAZOLAM HCL 5 MG/5ML IJ SOLN
INTRAMUSCULAR | Status: DC | PRN
  Administered 2012-01-02 (×2): 1 mg via INTRAVENOUS

## 2012-01-02 MED ORDER — RENA-VITE PO TABS
1.0000 | ORAL_TABLET | Freq: Every day | ORAL | Status: DC
  Administered 2012-01-03 – 2012-01-05 (×3): 1 via ORAL
  Filled 2012-01-02 (×3): qty 1

## 2012-01-02 MED ORDER — ALBUMIN HUMAN 5 % IV SOLN
INTRAVENOUS | Status: DC | PRN
  Administered 2012-01-02: 12:00:00 via INTRAVENOUS

## 2012-01-02 MED ORDER — CALCIUM ACETATE 667 MG PO CAPS
1334.0000 mg | ORAL_CAPSULE | Freq: Three times a day (TID) | ORAL | Status: DC
  Administered 2012-01-02 – 2012-01-05 (×6): 1334 mg via ORAL
  Filled 2012-01-02 (×12): qty 2

## 2012-01-02 MED ORDER — SORBITOL 70 % SOLN
30.0000 mL | Freq: Every day | Status: DC | PRN
  Administered 2012-01-04: 30 mL via ORAL
  Filled 2012-01-02: qty 30

## 2012-01-02 MED ORDER — PROTAMINE SULFATE 10 MG/ML IV SOLN
INTRAVENOUS | Status: DC | PRN
  Administered 2012-01-02 (×6): 10 mg via INTRAVENOUS
  Administered 2012-01-02: 15 mg via INTRAVENOUS

## 2012-01-02 MED ORDER — SIMVASTATIN 5 MG PO TABS: 5.0000 mg | ORAL_TABLET | Freq: Every day | ORAL | Status: DC

## 2012-01-02 MED ORDER — HYDROMORPHONE HCL PF 1 MG/ML IJ SOLN
0.2500 mg | INTRAMUSCULAR | Status: DC | PRN
Start: 2012-01-02 — End: 2012-01-02
  Administered 2012-01-02 (×4): 0.5 mg via INTRAVENOUS

## 2012-01-02 MED ORDER — OXYCODONE HCL 5 MG PO TABS
5.0000 mg | ORAL_TABLET | ORAL | Status: DC | PRN
  Administered 2012-01-03 – 2012-01-05 (×10): 10 mg via ORAL
  Filled 2012-01-02 (×10): qty 2

## 2012-01-02 MED ORDER — HYDROMORPHONE HCL PF 1 MG/ML IJ SOLN
INTRAMUSCULAR | Status: AC
  Filled 2012-01-02: qty 1

## 2012-01-02 MED ORDER — HYDRALAZINE HCL 20 MG/ML IJ SOLN
10.0000 mg | INTRAMUSCULAR | Status: DC | PRN
  Administered 2012-01-03: 10 mg via INTRAVENOUS
  Filled 2012-01-02: qty 0.5

## 2012-01-02 MED ORDER — GLYCOPYRROLATE 0.2 MG/ML IJ SOLN
INTRAMUSCULAR | Status: DC | PRN
  Administered 2012-01-02: .3 mg via INTRAVENOUS

## 2012-01-02 MED ORDER — METOPROLOL TARTRATE 1 MG/ML IV SOLN: 2.0000 mg | INTRAVENOUS | Status: DC | PRN

## 2012-01-02 MED ORDER — GABAPENTIN 100 MG PO CAPS
200.0000 mg | ORAL_CAPSULE | Freq: Every day | ORAL | Status: DC
  Administered 2012-01-03 – 2012-01-05 (×3): 200 mg via ORAL
  Filled 2012-01-02 (×3): qty 2

## 2012-01-02 MED ORDER — ATORVASTATIN CALCIUM 10 MG PO TABS
10.0000 mg | ORAL_TABLET | Freq: Every day | ORAL | Status: DC
  Administered 2012-01-03 – 2012-01-04 (×2): 10 mg via ORAL
  Filled 2012-01-02 (×3): qty 1

## 2012-01-02 MED ORDER — EPHEDRINE SULFATE 50 MG/ML IJ SOLN
INTRAMUSCULAR | Status: DC | PRN
  Administered 2012-01-02: 5 mg via INTRAVENOUS
  Administered 2012-01-02: 10 mg via INTRAVENOUS
  Administered 2012-01-02 (×3): 5 mg via INTRAVENOUS

## 2012-01-02 MED ORDER — HEMOSTATIC AGENTS (NO CHARGE) OPTIME
TOPICAL | Status: DC | PRN
  Administered 2012-01-02 (×2): 1 via TOPICAL

## 2012-01-02 MED ORDER — OXYCODONE HCL 5 MG PO TABS
5.0000 mg | ORAL_TABLET | Freq: Once | ORAL | Status: AC | PRN
  Administered 2012-01-02: 5 mg via ORAL

## 2012-01-02 MED ORDER — ISOSORBIDE MONONITRATE ER 60 MG PO TB24
60.0000 mg | ORAL_TABLET | Freq: Every day | ORAL | Status: DC
  Administered 2012-01-03 – 2012-01-05 (×3): 60 mg via ORAL
  Filled 2012-01-02 (×3): qty 1

## 2012-01-02 MED ORDER — FENTANYL CITRATE 0.05 MG/ML IJ SOLN
INTRAMUSCULAR | Status: DC | PRN
  Administered 2012-01-02: 25 ug via INTRAVENOUS
  Administered 2012-01-02 (×2): 50 ug via INTRAVENOUS
  Administered 2012-01-02: 25 ug via INTRAVENOUS
  Administered 2012-01-02 (×2): 50 ug via INTRAVENOUS
  Administered 2012-01-02: 100 ug via INTRAVENOUS
  Administered 2012-01-02: 50 ug via INTRAVENOUS

## 2012-01-02 MED ORDER — MORPHINE SULFATE 2 MG/ML IJ SOLN
2.0000 mg | INTRAMUSCULAR | Status: DC | PRN
  Administered 2012-01-02: 2 mg via INTRAVENOUS
  Administered 2012-01-02 – 2012-01-03 (×3): 4 mg via INTRAVENOUS
  Administered 2012-01-03: 2 mg via INTRAVENOUS
  Administered 2012-01-04 (×2): 4 mg via INTRAVENOUS
  Filled 2012-01-02 (×2): qty 2
  Filled 2012-01-02: qty 1
  Filled 2012-01-02 (×3): qty 2
  Filled 2012-01-02: qty 1

## 2012-01-02 MED ORDER — HEMOSTATIC AGENTS (NO CHARGE) OPTIME
TOPICAL | Status: DC | PRN
  Administered 2012-01-02 (×2): 1

## 2012-01-02 MED ORDER — ACETAMINOPHEN 325 MG PO TABS: 325.0000 mg | ORAL_TABLET | ORAL | Status: DC | PRN

## 2012-01-02 MED ORDER — NEOSTIGMINE METHYLSULFATE 1 MG/ML IJ SOLN
INTRAMUSCULAR | Status: DC | PRN
  Administered 2012-01-02: 2 mg via INTRAVENOUS

## 2012-01-02 MED ORDER — VANCOMYCIN HCL 1000 MG IV SOLR
1250.0000 mg | Freq: Once | INTRAVENOUS | Status: AC
  Administered 2012-01-02: 1250 mg via INTRAVENOUS
  Filled 2012-01-02: qty 1250

## 2012-01-02 SURGICAL SUPPLY — 76 items
APPLICATOR TIP COSEAL (VASCULAR PRODUCTS) ×3 IMPLANT
BANDAGE ELASTIC 4 VELCRO ST LF (GAUZE/BANDAGES/DRESSINGS) ×6 IMPLANT
BANDAGE ESMARK 6X9 LF (GAUZE/BANDAGES/DRESSINGS) IMPLANT
BANDAGE GAUZE ELAST BULKY 4 IN (GAUZE/BANDAGES/DRESSINGS) ×6 IMPLANT
BNDG ESMARK 6X9 LF (GAUZE/BANDAGES/DRESSINGS)
CANISTER SUCTION 2500CC (MISCELLANEOUS) ×3 IMPLANT
CANISTER WOUND CARE 500ML ATS (WOUND CARE) ×3 IMPLANT
CLIP TI MEDIUM 24 (CLIP) ×3 IMPLANT
CLIP TI WIDE RED SMALL 24 (CLIP) ×3 IMPLANT
CLOTH BEACON ORANGE TIMEOUT ST (SAFETY) ×3 IMPLANT
COVER SURGICAL LIGHT HANDLE (MISCELLANEOUS) ×3 IMPLANT
CUFF TOURNIQUET SINGLE 24IN (TOURNIQUET CUFF) IMPLANT
CUFF TOURNIQUET SINGLE 34IN LL (TOURNIQUET CUFF) IMPLANT
CUFF TOURNIQUET SINGLE 44IN (TOURNIQUET CUFF) IMPLANT
DERMABOND ADVANCED (GAUZE/BANDAGES/DRESSINGS) ×1
DERMABOND ADVANCED .7 DNX12 (GAUZE/BANDAGES/DRESSINGS) ×2 IMPLANT
DRAIN CHANNEL 15F RND FF W/TCR (WOUND CARE) IMPLANT
DRAPE WARM FLUID 44X44 (DRAPE) ×3 IMPLANT
DRAPE X-RAY CASS 24X20 (DRAPES) IMPLANT
DRSG COVADERM 4X10 (GAUZE/BANDAGES/DRESSINGS) IMPLANT
DRSG COVADERM 4X8 (GAUZE/BANDAGES/DRESSINGS) IMPLANT
DRSG VAC ATS SM SENSATRAC (GAUZE/BANDAGES/DRESSINGS) ×6 IMPLANT
ELECT REM PT RETURN 9FT ADLT (ELECTROSURGICAL) ×3
ELECTRODE REM PT RTRN 9FT ADLT (ELECTROSURGICAL) ×2 IMPLANT
EVACUATOR SILICONE 100CC (DRAIN) IMPLANT
GLOVE BIO SURGEON STRL SZ 6.5 (GLOVE) ×3 IMPLANT
GLOVE BIOGEL PI IND STRL 6.5 (GLOVE) ×2 IMPLANT
GLOVE BIOGEL PI IND STRL 7.0 (GLOVE) ×4 IMPLANT
GLOVE BIOGEL PI IND STRL 7.5 (GLOVE) ×4 IMPLANT
GLOVE BIOGEL PI INDICATOR 6.5 (GLOVE) ×1
GLOVE BIOGEL PI INDICATOR 7.0 (GLOVE) ×2
GLOVE BIOGEL PI INDICATOR 7.5 (GLOVE) ×2
GLOVE ECLIPSE 6.5 STRL STRAW (GLOVE) ×3 IMPLANT
GLOVE ECLIPSE 7.5 STRL STRAW (GLOVE) ×9 IMPLANT
GLOVE SS BIOGEL STRL SZ 7 (GLOVE) ×2 IMPLANT
GLOVE SUPERSENSE BIOGEL SZ 7 (GLOVE) ×1
GLOVE SURG SS PI 7.5 STRL IVOR (GLOVE) ×6 IMPLANT
GOWN PREVENTION PLUS XXLARGE (GOWN DISPOSABLE) ×12 IMPLANT
GOWN STRL NON-REIN LRG LVL3 (GOWN DISPOSABLE) ×3 IMPLANT
GRAFT PROPATEN W/RING 6X80X60 (Vascular Products) ×3 IMPLANT
HEMOSTAT SNOW SURGICEL 2X4 (HEMOSTASIS) ×3 IMPLANT
HEMOSTAT SURGICEL 2X14 (HEMOSTASIS) IMPLANT
KIT BASIN OR (CUSTOM PROCEDURE TRAY) ×3 IMPLANT
KIT ROOM TURNOVER OR (KITS) ×3 IMPLANT
MARKER GRAFT CORONARY BYPASS (MISCELLANEOUS) IMPLANT
NS IRRIG 1000ML POUR BTL (IV SOLUTION) ×9 IMPLANT
PACK PERIPHERAL VASCULAR (CUSTOM PROCEDURE TRAY) ×3 IMPLANT
PAD ARMBOARD 7.5X6 YLW CONV (MISCELLANEOUS) ×6 IMPLANT
PADDING CAST COTTON 6X4 STRL (CAST SUPPLIES) IMPLANT
SEALANT SURG COSEAL 4ML (VASCULAR PRODUCTS) ×6 IMPLANT
SET COLLECT BLD 21X3/4 12 (NEEDLE) IMPLANT
SPONGE GAUZE 4X4 12PLY (GAUZE/BANDAGES/DRESSINGS) ×6 IMPLANT
SPONGE LAP 18X18 X RAY DECT (DISPOSABLE) ×6 IMPLANT
STAPLER VISISTAT 35W (STAPLE) IMPLANT
STOPCOCK 4 WAY LG BORE MALE ST (IV SETS) IMPLANT
SUT ETHILON 3 0 PS 1 (SUTURE) ×3 IMPLANT
SUT GORETEX 6.0 TT9 (SUTURE) ×3 IMPLANT
SUT GORETEX CV-6TTC-13 36IN (SUTURE) ×6 IMPLANT
SUT PROLENE 5 0 C 1 24 (SUTURE) ×9 IMPLANT
SUT PROLENE 6 0 BV (SUTURE) ×18 IMPLANT
SUT PROLENE 7 0 BV 1 (SUTURE) IMPLANT
SUT SILK 2 0 SH (SUTURE) ×3 IMPLANT
SUT SILK 3 0 (SUTURE)
SUT SILK 3-0 18XBRD TIE 12 (SUTURE) IMPLANT
SUT VIC AB 2-0 CT1 27 (SUTURE) ×3
SUT VIC AB 2-0 CT1 TAPERPNT 27 (SUTURE) ×6 IMPLANT
SUT VIC AB 3-0 SH 27 (SUTURE) ×2
SUT VIC AB 3-0 SH 27X BRD (SUTURE) ×4 IMPLANT
SUT VIC AB 4-0 PS2 27 (SUTURE) ×3 IMPLANT
SUT VICRYL 4-0 PS2 18IN ABS (SUTURE) ×3 IMPLANT
TOWEL OR 17X24 6PK STRL BLUE (TOWEL DISPOSABLE) ×9 IMPLANT
TOWEL OR 17X26 10 PK STRL BLUE (TOWEL DISPOSABLE) ×6 IMPLANT
TRAY FOLEY CATH 14FRSI W/METER (CATHETERS) IMPLANT
TUBING EXTENTION W/L.L. (IV SETS) IMPLANT
UNDERPAD 30X30 INCONTINENT (UNDERPADS AND DIAPERS) ×3 IMPLANT
WATER STERILE IRR 1000ML POUR (IV SOLUTION) ×3 IMPLANT

## 2012-01-02 NOTE — Transfer of Care (Signed)
Immediate Anesthesia Transfer of Care Note  Patient: [name]  Procedure(s) Performed: Procedure(s) (LRB) with comments: BYPASS GRAFT FEMORAL-POPLITEAL ARTERY (Right) AMPUTATION RAY (Right)  Patient Location: PACU  Anesthesia Type: General  Level of Consciousness: awake, alert  and oriented  Airway & Oxygen Therapy: Patient Spontanous Breathing and Patient connected to nasal cannula oxygen  Post-op Assessment: Report given to PACU RN and Post -op Vital signs reviewed and stable  Post vital signs: Reviewed and stable  Complications: No apparent anesthesia complications

## 2012-01-02 NOTE — Progress Notes (Signed)
Contacted Brabham, MD/Collins, PA regarding pt HR dropping into the mid 40's.  No new orders received.  Also notified MD of sacral wound; wound care consult to be put in.  Will continue to monitor.  Salomon Mast, RN

## 2012-01-02 NOTE — Consult Note (Signed)
[name] is an 61 y.o. male referred by Dr Myra Gianotti   Chief Complaint: ESRD, sec HPTH, HTN HPI: 61 yo male with ESRD on HD in [place].  He is on MWF schedule for 3.5 hrs, BFR 450? Via Lt IJ permcath, DW 60 kg.  He is not on epogen and takes sensipar rather than Vit D.  Today he underwent R fem-pop  Bypass for non healing foot ulcer.  Past Medical History  Diagnosis Date  . Hyperlipidemia   . ESRD (end stage renal disease)     a. MWF dialysis in [place] (followed by Dr. Kristian Covey)  . Arthritis   . Claudication   . Stroke 2005  . Anemia   . Chronic diastolic CHF (congestive heart failure) 07/2010    a. 07/2008 Echo EF 50-55%, mild-mod LVH, Gr 1 DD.  Marland Kitchen Peripheral vascular occlusive disease     a. 07/2011 Periph Angio: No signif Ao-illiac dzs, LCF stenosis, 100% LSFA w recon above knee pop and 3 vessel runoff, 100% RSFA w/ recon above knee --> pending L Fem to below Knee pop bypass.  Marland Kitchen History of tobacco abuse   . Hypertension     takes Amlodipine,Metoprolol,and Prinivil daily  . Dyspnea on exertion     with exertion  . GERD (gastroesophageal reflux disease)   . Cataract     bilateral  . Dialysis patient   . PUD (peptic ulcer disease) mid 1990's    with GI bleed.  endoscoped in mid 1990's at [facility]    Past Surgical History  Procedure Date  . Arteriovenous graft placement   . Av fistula placement 12/10/2000    Right brachiocephalic arteriovenous   . Hernia repair   . Aortagram 07/29/2011    Abdominal Aortagram  . Cardiac catheterization 08/01/11    Left heart catheterization  . Femoral-popliteal bypass graft 09/09/2011    Procedure: BYPASS GRAFT FEMORAL-POPLITEAL ARTERY;  Surgeon: [name], MD;  Location: [facility] OR;  Service: Vascular;  Laterality: Left;  . Amputation 09/12/2011    Procedure: AMPUTATION BELOW KNEE;  Surgeon: [name], MD;  Location: [facility] OR;  Service: Vascular;  Laterality: Left;  TRANSMETATARSAL  . Application of wound vac 10/30/2011   Procedure: APPLICATION OF WOUND VAC;  Surgeon: [name], MD;  Location: [facility] OR;  Service: Vascular;  Laterality: Left;  . Groin debridement 11/01/2011    Procedure: Drucie Ip DEBRIDEMENT;  Surgeon: Chuck Hint, MD;  Location: [facility] OR;  Service: Vascular;  Laterality: Left;  . Esophagogastroduodenoscopy 11/06/2011    Procedure: ESOPHAGOGASTRODUODENOSCOPY (EGD);  Surgeon: Beverley Fiedler, MD;  Location: [facility] ENDOSCOPY;  Service: Gastroenterology;  Laterality: N/A;    Family History  Problem Relation Age of Onset  . Breast cancer Mother   . Cancer Father   . Anesthesia problems Neg Hx   . Hypotension Neg Hx   . Malignant hyperthermia Neg Hx   . Pseudochol deficiency Neg Hx    Social History:  reports that he has been smoking Cigarettes.  He has a 200 pack-year smoking history. He has never used smokeless tobacco. He reports that he does not drink alcohol or use illicit drugs.  Allergies: No Known Allergies  Medications Prior to Admission  Medication Sig Dispense Refill  . aspirin 81 MG chewable tablet Chew 1 tablet (81 mg total) by mouth daily.  30 tablet  0  . atorvastatin (LIPITOR) 10 MG tablet Take 10 mg by mouth at bedtime.      . cinacalcet (SENSIPAR)  30 MG tablet Take 30 mg by mouth daily.      . darbepoetin (ARANESP) 100 MCG/0.5ML SOLN Inject 0.5 mLs (100 mcg total) into the vein every Monday with hemodialysis.  4.2 mL  0  . gabapentin (NEURONTIN) 100 MG capsule Take 2 capsules (200 mg total) by mouth daily.  60 capsule  1  . hydrALAZINE (APRESOLINE) 25 MG tablet Take 25 mg by mouth 3 (three) times daily.      . isosorbide mononitrate (IMDUR) 60 MG 24 hr tablet Take 60 mg by mouth daily.      . multivitamin (RENA-VIT) TABS tablet Take 1 tablet by mouth daily.      . sevelamer (RENVELA) 800 MG tablet Take 2 tablets (1,600 mg total) by mouth 3 (three) times daily with meals.  180 tablet  0  . simvastatin (ZOCOR) 5 MG tablet Take 5 mg by mouth at bedtime.         Lab  Results: UA: nd  Basename 01/02/12 0834  WBC --  HGB 13.6  HCT 40.0  PLT --   BMET  Basename 01/02/12 0834  NA 140  K 4.6  CL --  CO2 --  GLUCOSE 75  BUN --  CREATININE --  CALCIUM --  PHOS --   LFT No results found for this basename: PROT,ALBUMIN,AST,ALT,ALKPHOS,BILITOT,BILIDIR,IBILI in the last 72 hours Dg Chest 2 View  01/02/2012  *RADIOLOGY REPORT*  Clinical Data: Preoperative assessment, follow-up right pleural effusion and airspace disease, history coronary artery disease, end- stage renal disease  CHEST - 2 VIEW  Comparison: 12/30/2011  Findings: Right jugular dual-lumen central venous catheter, distal tip projecting over right atrium. Enlargement of cardiac silhouette. Slight pulmonary vascular congestion. Question minimal perihilar edema. Small right and tiny left pleural effusions. Increased right basilar opacity since previous exam, likely a combination of consolidation particularly the right middle lobe and compressive atelectasis. No pneumothorax. Bilateral glenohumeral degenerative changes. Atherosclerotic calcifications aorta.  IMPRESSION: Enlargement of cardiac silhouette with pulmonary vascular congestion and question mild perihilar edema. Increased right pleural effusion with atelectasis and consolidation at right lung base.   Original Report Authenticated By: Lollie Marrow, M.D.     ROS:  Appetite good No sob No CP No abd pain No dysuria No vision changes Mild post oop pain rt leg  PHYSICAL EXAM: Blood pressure 154/60, pulse 68, temperature 97.2 F (36.2 C), temperature source Oral, resp. rate 12, weight 59 kg (130 lb 1.1 oz), SpO2 100.00%. HEENT: PERRLA EOMI NECK:Lt IJ permcath LUNGS:Clear CARDIAC:RRR wo MRG ABD:+ BS NTND no HSM EXT:1+ edema Rt leg  Trace edema on Lt.  Lt foot wrapped from previous transmet NEURO:CNI M&Si ox3 no asterixis  Assessment: 1. SP Rt Fem-Pop 2. Sec hpth, on sensipar 3. HTN 4. PVD 5. ESRD PLAN: 1. Plan HD in  AM 2. Cont his prior to admission renal meds 3. Recheck labs in AM 4. meds reviewed and changes made   Kena Limon T 01/02/2012, 3:23 PM

## 2012-01-02 NOTE — Op Note (Addendum)
[facility] [place]  Patient name: [name] MRN: [id number] DOB: [birth year] Sex: male  01/02/2012 Pre-operative Diagnosis: Ischemic right great toe Post-operative diagnosis:  Same Surgeon:  Jorge Ny Assistants:  Narda Amber Procedure:   1: Right femoral to above-knee popliteal artery bypass graft with 6 mm propatent Gore-Tex graft   2.) Right great toe amputation with metatarsal head   3.) placement of incisional vac Anesthesia:  Gen. Blood Loss:  See anesthesia record Specimens:  Right great toe  Findings:  Very calcified above-knee popliteal artery and common femoral artery. The patient had Doppler dependent signals in his posterior tibial and anterior tibial artery. There was good bleeding from his toe amputation site. I did resect the metatarsal head.  Indications:   The patient is previously undergone left leg revascularization for an ischemic foot which resulted in a transmetatarsal amputation. He has a new ulcer on his right foot. Angiogram revealed an occluded right superficial femoral artery. I did not feel that he is a good candidate for percutaneous revascularization and therefore I scheduled him for femoral-popliteal bypass graft. His vein mapping revealed a suboptimal vein.  Procedure:  The patient was identified in the holding area and taken to [facility] OR ROOM 16  The patient was then placed supine on the table. general anesthesia was administered.  The patient was prepped and draped in the usual sterile fashion.  A time out was called and antibiotics were administered.  A longitudinal incision was made in the right groin. Cautery was used to divide the subcutaneous tissue. The common femoral artery was dissected out from the inguinal ligament down to the bifurcation. The profunda and superficial femoral artery were each individually isolated. There was a very calcified posterior plaque. There was a fair amount of oozing of both blood and  lymphatic tissue within the wound. Next attention was turned towards the above-knee popliteal artery. A medial incision was made above the knee. Cautery was used to divide the subcutaneous tissue. The fascia was then opened with cautery. The popliteal space was entered. I dissected out the popliteal artery. It was very calcified with the exception of a portion in the mid part of the incision which I felt was soft enough for bypass. I reviewed his arteriogram again may somehow calcified the vessel was and elected to continue with an above-knee bypass. Next a long Gore tunneler was used to create a subsartorial tunnel. The patient was then fully heparinized. After the heparin circulated the common femoral, superficial femoral, and profunda femoral arteries were occluded with vascular clamps. A #11 blade was used to make an arteriotomy which was extended longitudinally with Potts scissors. A 6 mm Gore-Tex graft was then beveled to fit the size of the arteriotomy. A running anastomosis was created with Gore CV 6 suture. Prior to completion, the profundus maneuvers were performed and the anastomosis was completed. There was pulsatile flow through the graft. Next graft was then pulled through the previously created tunnel. The popliteal artery was then occluded. A #11 blade was used to make an arteriotomy which was extended longitudinally with Potts scissors. The leg was then fully extended and the graft cut to the appropriate length. It was beveled to fit the size of the arteriotomy. A running anastomosis was created with Gore CV 6 suture. Prior to completion the profundus maneuvers were performed. The anastomosis was completed. At this point Doppler signals were obtained in the anterior tibial and posterior tibial artery. There were brisk and  Doppler dependent. I then gave 50 mg of protamine. The patient had diffuse oozing throughout his incision. I administered DDAVP. After a while the bleeding slowed down. Once I was  comfortable I reapproximated the fascia in the above-knee incision with 2-0 Vicryl. The subcutaneous tissue was closed with 3-0 Vicryl the skin was closed with 4-0 Vicryl. Dermabond placed on the wound. Next once the groin was hemostatic the femoral sheath was reapproximated to Vicryl the subcutaneous tissue was closed with 2 layers of 3-0 Vicryl and 4 Vicryl is used to close the skin. A incisional VAC was placed in the left groin.  At this point attention was turned towards the right great toe which had ischemic changes at its tip. I told the patient in the holding area and I did not think this was salvageable and I recommended proceeding with toe amputation. He agreed with this plan. A fishmouth type incision was made at the base of the right great toe. A 10 blade was used to cut down to the bone. The bone was then transected with large bone cutters. I used a Rongeur to cut back to healthy bone. I did remove the metatarsal head. There was good bleeding from the wound bed. Once I had removed all the diseased bone I washed the wound out. I did not see any evidence of residual infection. There is no previous within the wound. The tissue looked viable. I then reapproximated the skin with interrupted 3-0 nylon suture.  Disposition:  To PACU in stable condition.   Juleen China, M.D. [facility] [place] Office: [phone] Pager:  [phone]

## 2012-01-02 NOTE — Anesthesia Preprocedure Evaluation (Signed)
Anesthesia Evaluation  Patient identified by MRN, date of birth, ID band Patient awake    Reviewed: Allergy & Precautions, H&P , NPO status , Patient's Chart, lab work & pertinent test results  Airway Mallampati: II  Neck ROM: full    Dental   Pulmonary shortness of breath, Current Smoker,          Cardiovascular hypertension, + CAD and +CHF     Neuro/Psych CVA    GI/Hepatic PUD, GERD-  ,  Endo/Other    Renal/GU DialysisRenal disease     Musculoskeletal   Abdominal   Peds  Hematology   Anesthesia Other Findings   Reproductive/Obstetrics                           Anesthesia Physical Anesthesia Plan  ASA: IV  Anesthesia Plan: General   Post-op Pain Management:    Induction: Intravenous  Airway Management Planned: Oral ETT  Additional Equipment:   Intra-op Plan:   Post-operative Plan: Extubation in OR  Informed Consent: I have reviewed the patients History and Physical, chart, labs and discussed the procedure including the risks, benefits and alternatives for the proposed anesthesia with the patient or authorized representative who has indicated his/her understanding and acceptance.     Plan Discussed with: CRNA and Surgeon  Anesthesia Plan Comments:         Anesthesia Quick Evaluation

## 2012-01-02 NOTE — H&P (Signed)
[facility] [place]   Patient name: [name] MRN: [id number] DOB: 01/07/1951 Sex: male    No chief complaint on file.   HISTORY OF PRESENT ILLNESS: The patient is status post left femoral to below knee popliteal artery bypass graft with vein and forefoot amputation for an ulcer. He is recovering nicely from this. He has developed a right foot ulcer. He recently underwent angiography which revealed an occluded superficial femoral artery with reconstitution of the above-knee popliteal artery. I did not feel like he is a good candidate for percutaneous revascularization and therefore he comes in today for surgical bypass. He has previously had vein mapping performed his right greater saphenous vein a suboptimal.   Past Medical History  Diagnosis Date  . Hyperlipidemia   . ESRD (end stage renal disease)     a. MWF dialysis in [place] (followed by Dr. Kristian Covey)  . Arthritis   . Claudication   . Stroke 2005  . Anemia   . Chronic diastolic CHF (congestive heart failure) 07/2010    a. 07/2008 Echo EF 50-55%, mild-mod LVH, Gr 1 DD.  Marland Kitchen Peripheral vascular occlusive disease     a. 07/2011 Periph Angio: No signif Ao-illiac dzs, LCF stenosis, 100% LSFA w recon above knee pop and 3 vessel runoff, 100% RSFA w/ recon above knee --> pending L Fem to below Knee pop bypass.  Marland Kitchen History of tobacco abuse   . Hypertension     takes Amlodipine,Metoprolol,and Prinivil daily  . Dyspnea on exertion     with exertion  . GERD (gastroesophageal reflux disease)   . Cataract     bilateral  . Dialysis patient   . PUD (peptic ulcer disease) mid 1990's    with GI bleed.  endoscoped in mid 1990's at [facility]    Past Surgical History  Procedure Date  . Arteriovenous graft placement   . Av fistula placement 12/10/2000    Right brachiocephalic arteriovenous   . Hernia repair   . Aortagram 07/29/2011    Abdominal Aortagram  . Cardiac catheterization 08/01/11    Left heart  catheterization  . Femoral-popliteal bypass graft 09/09/2011    Procedure: BYPASS GRAFT FEMORAL-POPLITEAL ARTERY;  Surgeon: [name], MD;  Location: [facility] OR;  Service: Vascular;  Laterality: Left;  . Amputation 09/12/2011    Procedure: AMPUTATION BELOW KNEE;  Surgeon: [name], MD;  Location: [facility] OR;  Service: Vascular;  Laterality: Left;  TRANSMETATARSAL  . Application of wound vac 10/30/2011    Procedure: APPLICATION OF WOUND VAC;  Surgeon: [name], MD;  Location: [facility] OR;  Service: Vascular;  Laterality: Left;  . Groin debridement 11/01/2011    Procedure: Drucie Ip DEBRIDEMENT;  Surgeon: Chuck Hint, MD;  Location: [facility] OR;  Service: Vascular;  Laterality: Left;  . Esophagogastroduodenoscopy 11/06/2011    Procedure: ESOPHAGOGASTRODUODENOSCOPY (EGD);  Surgeon: Beverley Fiedler, MD;  Location: [facility] ENDOSCOPY;  Service: Gastroenterology;  Laterality: N/A;    History   Social History  . Marital Status: Legally Separated    Spouse Name: N/A    Number of Children: N/A  . Years of Education: N/A   Occupational History  . DISABLED    Social History Main Topics  . Smoking status: Current Every Day Smoker -- 5.0 packs/day for 40 years    Types: Cigarettes  . Smokeless tobacco: Never Used   Comment: pt states that he wants to quit  . Alcohol Use: No  . Drug Use: No  .  Sexually Active: Not Currently   Other Topics Concern  . Not on file   Social History Narrative   Lives in [place].  His cousin helps him out and drives him to appts. He quit smoking earlier this year.    Family History  Problem Relation Age of Onset  . Breast cancer Mother   . Cancer Father   . Anesthesia problems Neg Hx   . Hypotension Neg Hx   . Malignant hyperthermia Neg Hx   . Pseudochol deficiency Neg Hx     Allergies as of 12/30/2011  . (No Known Allergies)    Current Facility-Administered Medications on File Prior to Encounter  Medication Dose Route Frequency Provider Last Rate Last Dose    . DISCONTD: 0.9 %  sodium chloride infusion   Intravenous Continuous [name], MD      . DISCONTD: acetaminophen (TYLENOL) suppository 325-650 mg  325-650 mg Rectal Q4H PRN [name], MD      . DISCONTD: acetaminophen (TYLENOL) tablet 325-650 mg  325-650 mg Oral Q4H PRN [name], MD      . DISCONTD: alum & mag hydroxide-simeth (MAALOX/MYLANTA) 200-200-20 MG/5ML suspension 15-30 mL  15-30 mL Oral Q2H PRN [name], MD      . DISCONTD: cefUROXime (ZINACEF) 1.5 g in dextrose 5 % 50 mL IVPB  1.5 g Intravenous 30 min Pre-Op [name], MD      . DISCONTD: guaiFENesin-dextromethorphan (ROBITUSSIN DM) 100-10 MG/5ML syrup 15 mL  15 mL Oral Q4H PRN [name], MD      . DISCONTD: hydrALAZINE (APRESOLINE) injection 10 mg  10 mg Intravenous Q2H PRN [name], MD      . DISCONTD: labetalol (NORMODYNE,TRANDATE) injection 10 mg  10 mg Intravenous Q2H PRN [name], MD      . DISCONTD: metoprolol (LOPRESSOR) injection 2-5 mg  2-5 mg Intravenous Q2H PRN [name], MD      . DISCONTD: morphine injection 2-5 mg  2-5 mg Intravenous Q1H PRN [name], MD      . DISCONTD: ondansetron (ZOFRAN) injection 4 mg  4 mg Intravenous Q6H PRN [name], MD      . DISCONTD: oxyCODONE-acetaminophen (PERCOCET/ROXICET) 5-325 MG per tablet 1-2 tablet  1-2 tablet Oral Q4H PRN [name], MD   2 tablet at 12/30/11 1142  . DISCONTD: phenol (CHLORASEPTIC) mouth spray 1 spray  1 spray Mouth/Throat PRN [name], MD      . DISCONTD: sodium chloride 0.9 % injection 3 mL  3 mL Intravenous PRN [name], MD       Current Outpatient Prescriptions on File Prior to Encounter  Medication Sig Dispense Refill  . aspirin 81 MG chewable tablet Chew 1 tablet (81 mg total) by mouth daily.  30 tablet  0  . atorvastatin (LIPITOR) 10 MG tablet Take 10 mg by mouth at bedtime.      . cinacalcet (SENSIPAR) 30 MG tablet Take 30 mg by mouth daily.      . darbepoetin  (ARANESP) 100 MCG/0.5ML SOLN Inject 0.5 mLs (100 mcg total) into the vein every Monday with hemodialysis.  4.2 mL  0  . gabapentin (NEURONTIN) 100 MG capsule Take 2 capsules (200 mg total) by mouth daily.  60 capsule  1  . hydrALAZINE (APRESOLINE) 25 MG tablet Take 25 mg by mouth 3 (three) times daily.      . isosorbide mononitrate (IMDUR) 60 MG 24 hr  tablet Take 60 mg by mouth daily.      . multivitamin (RENA-VIT) TABS tablet Take 1 tablet by mouth daily.      . sevelamer (RENVELA) 800 MG tablet Take 2 tablets (1,600 mg total) by mouth 3 (three) times daily with meals.  180 tablet  0  . simvastatin (ZOCOR) 5 MG tablet Take 5 mg by mouth at bedtime.         REVIEW OF SYSTEMS: See clinic visit. No changes  PHYSICAL EXAMINATION:   Vital signs are BP 169/81  Pulse 55  Temp 98.1 F (36.7 C) (Oral)  Resp 18  SpO2 98% General: The patient appears their stated age. HEENT:  No gross abnormalities Pulmonary:  Non labored breathing Abdomen: Soft and non-tender Musculoskeletal: There are no major deformities. Neurologic: No focal weakness or paresthesias are detected, Skin: There are no ulcer or rashes noted. Psychiatric: The patient has normal affect. Cardiovascular: There is a regular rate and rhythm without significant murmur appreciated.    Assessment: Right foot ulcer Plan: The patient comes in today for right femoral to above-knee popliteal artery bypass graft with Gore-Tex. The risks and benefits were discussed with the patient and he wishes to proceed for limb salvage. He understands the risk of infection, bleeding, cardiopulmonary complications.  Jorge Ny, M.D. [facility] [place] Office: [phone] Pager:  [phone]

## 2012-01-02 NOTE — Anesthesia Postprocedure Evaluation (Signed)
  Anesthesia Post-op Note  Patient: [name]  Procedure(s) Performed: Procedure(s) (LRB) with comments: BYPASS GRAFT FEMORAL-POPLITEAL ARTERY (Right) AMPUTATION RAY (Right)  Patient Location: PACU  Anesthesia Type: General  Level of Consciousness: awake, oriented, sedated and patient cooperative  Airway and Oxygen Therapy: Patient Spontanous Breathing  Post-op Pain: mild  Post-op Assessment: Post-op Vital signs reviewed, Patient's Cardiovascular Status Stable, Respiratory Function Stable, Patent Airway, No signs of Nausea or vomiting and Pain level controlled  Post-op Vital Signs: stable  Complications: No apparent anesthesia complications

## 2012-01-02 NOTE — Progress Notes (Signed)
Patient arrived in OR holding area with no IV other than left dialysis catheter intact

## 2012-01-02 NOTE — Progress Notes (Signed)
ANTIBIOTIC CONSULT NOTE - INITIAL  Pharmacy Consult for Vanc Indication: Foot ulcers  No Known Allergies  Patient Measurements: Weight: 130 lb 1.1 oz (59 kg)   Vital Signs: Temp: 94.9 F (34.9 C) (10/11 1645) Temp src: Oral (10/11 1645) BP: 154/60 mmHg (10/11 1445) Pulse Rate: 68  (10/11 1445) Intake/Output from previous day:   Intake/Output from this shift: Total I/O In: 1150 [I.V.:900; IV Piggyback:250] Out: 350 [Blood:350]  Labs:  [facility] 01/02/12 0834  WBC --  HGB 13.6  PLT --  LABCREA --  CREATININE --   The CrCl is unknown because both a height and weight (above a minimum accepted value) are required for this calculation. No results found for this basename: VANCOTROUGH:2,VANCOPEAK:2,VANCORANDOM:2,GENTTROUGH:2,GENTPEAK:2,GENTRANDOM:2,TOBRATROUGH:2,TOBRAPEAK:2,TOBRARND:2,AMIKACINPEAK:2,AMIKACINTROU:2,AMIKACIN:2, in the last 72 hours   Microbiology: Recent Results (from the past 720 hour(s))  SURGICAL PCR SCREEN     Status: Abnormal   Collection Time   12/30/11  3:40 PM      Component Value Range Status Comment   MRSA, PCR POSITIVE (*) NEGATIVE Final    Staphylococcus aureus POSITIVE (*) NEGATIVE Final     Medical History: Past Medical History  Diagnosis Date  . Hyperlipidemia   . ESRD (end stage renal disease)     a. MWF dialysis in [place] (followed by Dr. Kristian Covey)  . Arthritis   . Claudication   . Stroke 2005  . Anemia   . Chronic diastolic CHF (congestive heart failure) 07/2010    a. 07/2008 Echo EF 50-55%, mild-mod LVH, Gr 1 DD.  Marland Kitchen Peripheral vascular occlusive disease     a. 07/2011 Periph Angio: No signif Ao-illiac dzs, LCF stenosis, 100% LSFA w recon above knee pop and 3 vessel runoff, 100% RSFA w/ recon above knee --> pending L Fem to below Knee pop bypass.  Marland Kitchen History of tobacco abuse   . Hypertension     takes Amlodipine,Metoprolol,and Prinivil daily  . Dyspnea on exertion     with exertion  . GERD (gastroesophageal reflux disease)   .  Cataract     bilateral  . Dialysis patient   . PUD (peptic ulcer disease) mid 1990's    with GI bleed.  endoscoped in mid 1990's at [facility] hospital    Medications:  Scheduled:    . aspirin  81 mg Oral Daily  . atorvastatin  10 mg Oral QHS  . calcium acetate  1,334 mg Oral TID WC  . cefUROXime (ZINACEF)  IV  1.5 g Intravenous 30 min Pre-Op  . cinacalcet  30 mg Oral Daily  . desmopressin (DDAVP) IV  0.3 mcg/kg Intravenous To OR  . docusate sodium  100 mg Oral Daily  . gabapentin  200 mg Oral Daily  . hydrALAZINE  25 mg Oral TID  . HYDROmorphone      . HYDROmorphone      . isosorbide mononitrate  60 mg Oral Daily  . lanthanum  2,000 mg Oral TID WC  . multivitamin  1 tablet Oral Daily  . oxyCODONE      . sevelamer  1,600 mg Oral TID WC  . simvastatin  5 mg Oral QHS   Assessment: 61 yo who has hx of PVD. He was admitted with a foot gangrene. He is s/p fem-pop bypass. Hx of enterococcus. Empiric vancomycin. He is also an ESRD pt that normally gets HD on MWF.   Goal of Therapy:  Vancomycin trough level 10-15 mcg/ml  Plan:  Vanc 1250 mg IV x1 Will redose after HD tomorrow  Ulyses Southward [place]on  01/02/2012,4:54 PM

## 2012-01-03 ENCOUNTER — Inpatient Hospital Stay (HOSPITAL_COMMUNITY): Payer: Medicare Other

## 2012-01-03 LAB — BASIC METABOLIC PANEL
BUN: 33 mg/dL — ABNORMAL HIGH (ref 6–23)
Chloride: 99 mEq/L (ref 96–112)
GFR calc Af Amer: 10 mL/min — ABNORMAL LOW (ref >90)
GFR calc non Af Amer: 9 mL/min — ABNORMAL LOW (ref >90)
Potassium: 5.1 mEq/L (ref 3.5–5.1)
Sodium: 138 mEq/L (ref 135–145)

## 2012-01-03 LAB — CBC
HCT: 30.1 % — ABNORMAL LOW (ref 39.0–52.0)
MCHC: 31.6 g/dL (ref 30.0–36.0)
Platelets: 230 10*3/uL (ref 150–400)
RDW: 18.8 % — ABNORMAL HIGH (ref 11.5–15.5)
WBC: 6 10*3/uL (ref 4.0–10.5)

## 2012-01-03 MED ORDER — MORPHINE SULFATE 4 MG/ML IJ SOLN
INTRAMUSCULAR | Status: AC
  Administered 2012-01-03: 4 mg
  Filled 2012-01-03: qty 1

## 2012-01-03 MED ORDER — LABETALOL HCL 5 MG/ML IV SOLN
INTRAVENOUS | Status: AC
  Administered 2012-01-03: 10 mg via INTRAVENOUS
  Filled 2012-01-03: qty 4

## 2012-01-03 MED ORDER — HEPARIN SODIUM (PORCINE) 1000 UNIT/ML IJ SOLN
1300.0000 [IU] | Freq: Once | INTRAMUSCULAR | Status: AC
  Administered 2012-01-03: 1300 [IU] via INTRAVENOUS
  Filled 2012-01-03: qty 1.3

## 2012-01-03 NOTE — Progress Notes (Addendum)
[facility] [place]  Progress Note Bypass Surgery  Date of Surgery: 01/02/2012  Procedure(s): BYPASS GRAFT FEMORAL-POPLITEAL ARTERY AMPUTATION RAY Surgeon: Surgeon(s): [name], MD  1 Day Post-Op  History of Present Illness  [name] is a 61 y.o. male who is S/P Procedure(s): BYPASS GRAFT FEMORAL-POPLITEAL ARTERY AMPUTATION RAY right.  The patient's pre-op symptoms of pain  are Improved . Patients pain is well controlled.    VASC. LAB Studies:        ABI: pending   Imaging: Dg Chest 2 View  01/02/2012  *RADIOLOGY REPORT*  Clinical Data: Preoperative assessment, follow-up right pleural effusion and airspace disease, history coronary artery disease, end- stage renal disease  CHEST - 2 VIEW  Comparison: 12/30/2011  Findings: Right jugular dual-lumen central venous catheter, distal tip projecting over right atrium. Enlargement of cardiac silhouette. Slight pulmonary vascular congestion. Question minimal perihilar edema. Small right and tiny left pleural effusions. Increased right basilar opacity since previous exam, likely a combination of consolidation particularly the right middle lobe and compressive atelectasis. No pneumothorax. Bilateral glenohumeral degenerative changes. Atherosclerotic calcifications aorta.  IMPRESSION: Enlargement of cardiac silhouette with pulmonary vascular congestion and question mild perihilar edema. Increased right pleural effusion with atelectasis and consolidation at right lung base.   Original Report Authenticated By: Lollie Marrow, M.D.     Significant Diagnostic Studies: CBC Lab Results  Component Value Date   WBC 6.0 01/03/2012   HGB 9.5* 01/03/2012   HCT 30.1* 01/03/2012   MCV 92.6 01/03/2012   PLT 230 01/03/2012    BMET     Component Value Date/Time   NA 138 01/03/2012 0810   K 5.1 01/03/2012 0810   CL 99 01/03/2012 0810   CO2 26 01/03/2012 0810   GLUCOSE 84 01/03/2012 0810   BUN 33* 01/03/2012  0810   CREATININE 6.18* 01/03/2012 0810   CALCIUM 9.1 01/03/2012 0810   GFRNONAA 9* 01/03/2012 0810   GFRAA 10* 01/03/2012 0810    COAG Lab Results  Component Value Date   INR 1.17 12/30/2011   INR 1.40 11/05/2011   INR 1.30 09/12/2011   No results found for this basename: PTT    Physical Examination  BP Readings from Last 3 Encounters:  01/03/12 178/84  01/03/12 178/84  12/30/11 160/50   Temp Readings from Last 3 Encounters:  01/03/12 98.7 F (37.1 C) Oral  01/03/12 98.7 F (37.1 C) Oral  12/30/11 98.4 F (36.9 C) Oral   SpO2 Readings from Last 3 Encounters:  01/03/12 98%  01/03/12 98%  12/30/11 100%   Pulse Readings from Last 3 Encounters:  01/03/12 65  01/03/12 65  12/30/11 62    Pt is A&O x 3 right lower extremity: Incision/s is/are clean, dry, intact or clean,dry.intact, and  clean, dry, intact or healing without hematoma, erythema or drainage Limb is warm; with good color Right foot dressing in place plan to change tomorrow. Wound vac in place on right groin no recorded output.    Assessment/Plan: Pt. Doing well Post-op pain is controlled Wounds are clean, dry, intact or healing well PT/OT for ambulation Continue wound care as ordered Transfer to 2000 Will keep wound vac in place. We will change the right foot dressing tomorrow. Darco shoe for right foot for ambulation.  Clinton Gallant [facility] [phone] 01/03/2012 9:40 AM   I agree with the above. I have seen and examined the patient. His incisions are intact. The dressing on his amputation site remains. This will be  removed tomorrow. He can be transferred to the floor  Durene Cal

## 2012-01-03 NOTE — Progress Notes (Signed)
Pt had one episode of emesis. Moderate amount of dark liquid.Mouth care provided. We will continue to monitor. Oncoming nurse was made aware.

## 2012-01-03 NOTE — Progress Notes (Signed)
PT Cancellation Note  Patient Details Name: [name] MRN: [id number] DOB: 10-01-1950   Cancelled Treatment:    Reason Eval/Treat Not Completed: Patient at procedure or test/unavailable (pt currently in dialysis )   [name] 01/03/2012, 1:35 PM

## 2012-01-03 NOTE — Progress Notes (Signed)
B/P 188/66 HR 69 Hydralazine 10 mg IV prn administered as ordered.We will continue to monitor.

## 2012-01-03 NOTE — Progress Notes (Addendum)
S: Co achy pain Rt foot O:BP 176/74  Pulse 71  Temp 98.2 F (36.8 C) (Oral)  Resp 13  Ht 5\' 6"  (1.676 m)  Wt 68 kg (149 lb 14.6 oz)  BMI 24.20 kg/m2  SpO2 98%  Intake/Output Summary (Last 24 hours) at 01/03/12 0704 Last data filed at 01/02/12 2315  Gross per 24 hour  Intake   1872 ml  Output    350 ml  Net   1522 ml   Weight change:  ZOX:WRUEA and alert CVS:RRR Resp:clear Abd:+ BS NTND Ext:rt foot wrapped, 1+ edema rt leg NEURO:CNI ox3 no asterixis      . aspirin  81 mg Oral Daily  . atorvastatin  10 mg Oral QHS  . calcium acetate  1,334 mg Oral TID WC  . cefUROXime (ZINACEF)  IV  1.5 g Intravenous 30 min Pre-Op  . cinacalcet  30 mg Oral Daily  . desmopressin (DDAVP) IV  0.3 mcg/kg Intravenous To OR  . docusate sodium  100 mg Oral Daily  . gabapentin  200 mg Oral Daily  . hydrALAZINE  25 mg Oral TID  . HYDROmorphone      . HYDROmorphone      . isosorbide mononitrate  60 mg Oral Daily  . lanthanum  2,000 mg Oral TID WC  . multivitamin  1 tablet Oral Daily  . oxyCODONE      . sevelamer  1,600 mg Oral TID WC  . vancomycin  1,250 mg Intravenous Once  . DISCONTD: simvastatin  5 mg Oral QHS   Dg Chest 2 View  01/02/2012  *RADIOLOGY REPORT*  Clinical Data: Preoperative assessment, follow-up right pleural effusion and airspace disease, history coronary artery disease, end- stage renal disease  CHEST - 2 VIEW  Comparison: 12/30/2011  Findings: Right jugular dual-lumen central venous catheter, distal tip projecting over right atrium. Enlargement of cardiac silhouette. Slight pulmonary vascular congestion. Question minimal perihilar edema. Small right and tiny left pleural effusions. Increased right basilar opacity since previous exam, likely a combination of consolidation particularly the right middle lobe and compressive atelectasis. No pneumothorax. Bilateral glenohumeral degenerative changes. Atherosclerotic calcifications aorta.  IMPRESSION: Enlargement of cardiac  silhouette with pulmonary vascular congestion and question mild perihilar edema. Increased right pleural effusion with atelectasis and consolidation at right lung base.   Original Report Authenticated By: Lollie Marrow, M.D.    BMET    Component Value Date/Time   NA 140 01/02/2012 0834   K 4.6 01/02/2012 0834   CL 97 12/30/2011 1408   CO2 23 12/30/2011 1408   GLUCOSE 75 01/02/2012 0834   BUN 24* 12/30/2011 1408   CREATININE 4.68* 12/30/2011 1408   CALCIUM 8.7 12/30/2011 1408   GFRNONAA 12* 12/30/2011 1408   GFRAA 14* 12/30/2011 1408   CBC    Component Value Date/Time   WBC 4.8 12/30/2011 1408   RBC 4.41 12/30/2011 1408   HGB 13.6 01/02/2012 0834   HCT 40.0 01/02/2012 0834   PLT 200 12/30/2011 1408   MCV 93.0 12/30/2011 1408   MCH 29.9 12/30/2011 1408   MCHC 32.2 12/30/2011 1408   RDW 18.3* 12/30/2011 1408   LYMPHSABS 0.9 11/05/2011 1058   MONOABS 0.6 11/05/2011 1058   EOSABS 0.1 11/05/2011 1058   BASOSABS 0.1 11/05/2011 1058     Assessment: 1. SP Rt fem pop 2. 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 HTN 4. ESRD  Plan: 1. HD today and recheck labs at HD   Nelton Amsden T

## 2012-01-03 NOTE — Progress Notes (Signed)
Patient resting comfortably in no distress transferred to hemodialysis.

## 2012-01-04 DIAGNOSIS — Z48812 Encounter for surgical aftercare following surgery on the circulatory system: Secondary | ICD-10-CM

## 2012-01-04 LAB — HEPATITIS B SURFACE ANTIBODY,QUALITATIVE: Hep B S Ab: POSITIVE — AB

## 2012-01-04 LAB — HEPATITIS B SURFACE ANTIGEN: Hepatitis B Surface Ag: NEGATIVE

## 2012-01-04 NOTE — Progress Notes (Signed)
Patient had thin brown emesis today. Patient felt nauseous and stated that he has indigestion. Gave patient Zofran 4mg . Lajuana Matte, RN

## 2012-01-04 NOTE — Progress Notes (Signed)
VASCULAR LAB PRELIMINARY  ARTERIAL  ABI completed:    RIGHT    LEFT    PRESSURE WAVEFORM  PRESSURE WAVEFORM  BRACHIAL >311 Triphasic BRACHIAL >300 Triphasic         AT >304 Monophasic AT >312 Monophasic  PT >310 Monophasic PT 285 Triphasic                  RIGHT LEFT  ABI N/A N/A   Bilateral ABIs could not be ascertained due to incompressible vessels probably secondary to calcification. Abnormal Doppler waveforms. Unable to obtain brachial pressure on the left due to incompressible vessel probably secondary to previous fistula which in now inactive.  Eryn Marandola, RVS 01/04/2012, 10:53 AM

## 2012-01-04 NOTE — Progress Notes (Signed)
Monitor tech notified me of a 8 beat run of SVT. RN and NT were helping patient get cleaned up after an episode of emesis. Patient returned to NSR. Patient resting comfortably in bed. Will continue to monitor closely. Lajuana Matte, RN

## 2012-01-04 NOTE — Progress Notes (Addendum)
[facility] [place]  Progress Note Bypass Surgery  Date of Surgery: 01/02/2012  Procedure(s): BYPASS GRAFT FEMORAL-POPLITEAL ARTERY AMPUTATION RAY Surgeon: Surgeon(s): [name], MD  2 Days Post-Op  History of Present Illness  [name] is a 61 y.o. male who is S/P Procedure(s): BYPASS GRAFT FEMORAL-POPLITEAL ARTERY AMPUTATION RAY right.  The patient's pre-op symptoms of pain are Unchanged . Patients pain is well controlled.    VASC. LAB Studies:        WUJ:[id number]   Imaging: No results found.  Significant Diagnostic Studies: CBC Lab Results  Component Value Date   WBC 6.0 01/03/2012   HGB 9.5* 01/03/2012   HCT 30.1* 01/03/2012   MCV 92.6 01/03/2012   PLT 230 01/03/2012    BMET     Component Value Date/Time   NA 138 01/03/2012 0810   K 5.1 01/03/2012 0810   CL 99 01/03/2012 0810   CO2 26 01/03/2012 0810   GLUCOSE 84 01/03/2012 0810   BUN 33* 01/03/2012 0810   CREATININE 6.18* 01/03/2012 0810   CALCIUM 9.1 01/03/2012 0810   GFRNONAA 9* 01/03/2012 0810   GFRAA 10* 01/03/2012 0810    COAG Lab Results  Component Value Date   INR 1.17 12/30/2011   INR 1.40 11/05/2011   INR 1.30 09/12/2011   No results found for this basename: PTT    Physical Examination  BP Readings from Last 3 Encounters:  01/04/12 121/83  01/04/12 121/83  12/30/11 160/50   Temp Readings from Last 3 Encounters:  01/04/12 100.7 F (38.2 C) Oral  01/04/12 100.7 F (38.2 C) Oral  12/30/11 98.4 F (36.9 C) Oral   SpO2 Readings from Last 3 Encounters:  01/04/12 97%  01/04/12 97%  12/30/11 100%   Pulse Readings from Last 3 Encounters:  01/04/12 76  01/04/12 76  12/30/11 62    Pt is A&O x 3  right lower extremity: Incision/s is/are clean, dry, intact or clean,dry.intact, and clean, dry, intact or healing  without hematoma, erythema or drainage  Limb is warm; with good color  Right foot Dp monophasic/left DP monophasic. Left transmeatal  wound and heel wound need dressings per wound care team.  Foul odor with known MRSA. Wound vac in place on right groin no recorded output   Assessment/Plan: Pt. Doing well Post-op pain is controlled Wounds are healing well and dry.   Pending wound care treatment to left foot and gluteal area. PT/OT for ambulation Continue wound care as ordered Nursing will treat ear congestion with peroxide in the ear canal.  Clinton Gallant [facility] [phone] 01/04/2012 8:55 AM        I agree with the above. The patient has been seen and examined. 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 If he spikes a high fever today we will culture him, otherwise cultures will be sent during dialysis tomorrow Hopefully he can be discharged within the next one to 2 days  Wells Brabham

## 2012-01-04 NOTE — Consult Note (Addendum)
WOC consult Note Reason for Consult: Requested consult for provision of wound care orders to left surgical site (previous toe amputation); also Stage IV sacral pressure ulcer Wound type:surgical, chronic pressure ulcer Pressure Ulcer POA: Yes Measurement:Left foot surgical site measures 10cm x 1.5cm x .4cm with wound bed 80% clean and granulating, 20% yellow slough.  Chronic Pressure Ulcer measures 4cm x 3cm x 1cm with 3cm undermining at 12 o'clock and 2cm undermining at 3 o'clock. Wound bed is pale pink and moist.There is evidence of wound contraction (scarring), and I suspect presence of a biofilm. Wound bed:  As above Drainage (amount, consistency, odor) None from left foot.  Moderate amount from sacral ulcer. Periwound:intact (both) Dressing procedure/placement/frequency:I have ordered a conservative dressing to the left foot (normal saline, twice daily).  A dressing to obliterate the dead space at the sacral wound is indicated; I will suggest and provide orders for silver hydrofiber with a gauze topper with daily changes to address dead space and suspected presence of a biofilm delaying wound healing.  It may be considered that this sacral ulcer has a complication of osteomyelitis.  If you agree, please order confirming radiologic studies.  It should be noted that this patient has a surgical wound on the right foot; PA is changing this dressing daily and there are no orders for staff to perform wound care.  There is also a V.A.C. device to the patient's groin wound.  Note:  A low air loss overlay is provided as patient sleeps on one at home, has a Stage IV pressure ulcer. I will not follow.  Please re-consult the WOC team if needed. Thanks, Ladona Mow, MSN, RN, [facility], CWOCN [phone])

## 2012-01-04 NOTE — Progress Notes (Signed)
PT Cancellation Note  Patient Details Name: [name] MRN: [id number] DOB: 07/20/1950   Cancelled Treatment:    Reason Eval/Treat Not Completed: Other (comment) (Patient refusing OOB/PT today.)  Reports "They cut my foot off and my legs are swollen".  Max encouragement to participate with PT - patient declined.  Will return tomorrow to attempt PT eval.   Vena Austria 01/04/2012, 11:25 AM [phone]

## 2012-01-04 NOTE — Progress Notes (Signed)
S: still with pain rt foot.  No fevers or chills.  Feels well O:BP 121/83  Pulse 76  Temp 100.7 F (38.2 C) (Oral)  Resp 18  Ht 5\' 6"  (1.676 m)  Wt 62.551 kg (137 lb 14.4 oz)  BMI 22.26 kg/m2  SpO2 97%  Intake/Output Summary (Last 24 hours) at 01/04/12 1009 Last data filed at 01/03/12 1630  Gross per 24 hour  Intake      0 ml  Output   4900 ml  Net  -4900 ml   Weight change: 7.1 kg (15 lb 10.4 oz) EXB:MWUXL and alert CVS:RRR Resp:clear Abd:+ BS NTND Ext:rt foot wrapped, tr edema rt leg  Lt trnasme not healed but granulating in NEURO:CNI ox3 no asterixis      . aspirin  81 mg Oral Daily  . atorvastatin  10 mg Oral QHS  . calcium acetate  1,334 mg Oral TID WC  . cinacalcet  30 mg Oral Daily  . docusate sodium  100 mg Oral Daily  . gabapentin  200 mg Oral Daily  . heparin  1,300 Units Intravenous Once  . hydrALAZINE  25 mg Oral TID  . isosorbide mononitrate  60 mg Oral Daily  . lanthanum  2,000 mg Oral TID WC  . morphine      . morphine      . multivitamin  1 tablet Oral Daily  . sevelamer  1,600 mg Oral TID WC   No results found. BMET    Component Value Date/Time   NA 138 01/03/2012 0810   K 5.1 01/03/2012 0810   CL 99 01/03/2012 0810   CO2 26 01/03/2012 0810   GLUCOSE 84 01/03/2012 0810   BUN 33* 01/03/2012 0810   CREATININE 6.18* 01/03/2012 0810   CALCIUM 9.1 01/03/2012 0810   GFRNONAA 9* 01/03/2012 0810   GFRAA 10* 01/03/2012 0810   CBC    Component Value Date/Time   WBC 6.0 01/03/2012 0810   RBC 3.25* 01/03/2012 0810   HGB 9.5* 01/03/2012 0810   HCT 30.1* 01/03/2012 0810   PLT 230 01/03/2012 0810   MCV 92.6 01/03/2012 0810   MCH 29.2 01/03/2012 0810   MCHC 31.6 01/03/2012 0810   RDW 18.8* 01/03/2012 0810   LYMPHSABS 0.9 11/05/2011 1058   MONOABS 0.6 11/05/2011 1058   EOSABS 0.1 11/05/2011 1058   BASOSABS 0.1 11/05/2011 1058     Assessment: 1. 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 HTN 4. ESRD  Plan: 1. Low grade  temp this am.  He certainly has enough wound issues to explain a fever but also has a catheter.  Will plan on drawing BC in am at HD as they will be "cleaner" but if he were to spike a higher temp today then would check BC then and start empiric AB to include vanco 2. HD in AM Keyonda Bickle T

## 2012-01-05 ENCOUNTER — Encounter (HOSPITAL_COMMUNITY): Payer: Self-pay | Admitting: Surgery

## 2012-01-05 ENCOUNTER — Inpatient Hospital Stay (HOSPITAL_COMMUNITY): Payer: Medicare Other

## 2012-01-05 LAB — FERRITIN: Ferritin: 584 ng/mL — ABNORMAL HIGH (ref 22–322)

## 2012-01-05 LAB — RENAL FUNCTION PANEL
CO2: 28 mEq/L (ref 19–32)
Calcium: 8.5 mg/dL (ref 8.4–10.5)
GFR calc Af Amer: 10 mL/min — ABNORMAL LOW (ref >90)
Glucose, Bld: 66 mg/dL — ABNORMAL LOW (ref 70–99)
Sodium: 138 mEq/L (ref 135–145)

## 2012-01-05 LAB — CBC WITH DIFFERENTIAL/PLATELET
Basophils Relative: 1 % (ref 0–1)
HCT: 27 % — ABNORMAL LOW (ref 39.0–52.0)
Hemoglobin: 8.3 g/dL — ABNORMAL LOW (ref 13.0–17.0)
Lymphocytes Relative: 20 % (ref 12–46)
MCHC: 30.7 g/dL (ref 30.0–36.0)
Monocytes Absolute: 0.5 10*3/uL (ref 0.1–1.0)
Monocytes Relative: 8 % (ref 3–12)
Neutro Abs: 4.3 10*3/uL (ref 1.7–7.7)

## 2012-01-05 LAB — IRON AND TIBC: UIBC: 81 ug/dL — ABNORMAL LOW (ref 125–400)

## 2012-01-05 MED ORDER — DARBEPOETIN ALFA-POLYSORBATE 100 MCG/0.5ML IJ SOLN
100.0000 ug | INTRAMUSCULAR | Status: DC
  Filled 2012-01-05: qty 0.5

## 2012-01-05 MED ORDER — OXYCODONE HCL 5 MG PO TABS: 5.0000 mg | ORAL_TABLET | ORAL | Status: DC | PRN

## 2012-01-05 MED ORDER — DARBEPOETIN ALFA-POLYSORBATE 100 MCG/0.5ML IJ SOLN
INTRAMUSCULAR | Status: AC
  Administered 2012-01-05: 100 ug via INTRAVENOUS
  Filled 2012-01-05: qty 0.5

## 2012-01-05 NOTE — Evaluation (Signed)
Occupational Therapy Evaluation Patient Details Name: [name] MRN: [id number] DOB: 11-06-1950 Today's Date: 01/05/2012 Time: 3086-5784 OT Time Calculation (min): 15 min  OT Assessment / Plan / Recommendation Clinical Impression  Pt. with limited participation due to pain bil. feet.  Pt. hoping to discharge today.  Pt. has all DME, and 24 hour supervision at home.  Pt. will use W/c as needed    OT Assessment  Patient does not need any further OT services    Follow Up Recommendations  No OT follow up    Barriers to Discharge      Equipment Recommendations  None recommended by OT;None recommended by PT    Recommendations for Other Services    Frequency       Precautions / Restrictions Precautions Precautions: Fall Precaution Comments: pt with wound vac Restrictions Weight Bearing Restrictions: No       ADL  Eating/Feeding: Simulated;Independent Where Assessed - Eating/Feeding: Bed level Grooming: Simulated;Wash/dry hands;Wash/dry face;Teeth care;Shaving;Set up Where Assessed - Grooming: Supine, head of bed up Upper Body Bathing: Simulated;Set up Where Assessed - Upper Body Bathing: Unsupported sitting Lower Body Bathing: Simulated;Minimal assistance Where Assessed - Lower Body Bathing: Supported sit to stand Upper Body Dressing: Simulated;Supervision/safety Where Assessed - Upper Body Dressing: Unsupported sitting Lower Body Dressing: Simulated;Minimal assistance Where Assessed - Lower Body Dressing: Supported sit to stand Toilet Transfer: Mining engineer Method: Surveyor, minerals: Materials engineer and Hygiene: Simulated;Minimal assistance Where Assessed - Engineer, mining and Hygiene: Standing Transfers/Ambulation Related to ADLs: Pt. moved sit to stand with min guard assist for very brief period.  Pt. reports increased pain bil. feet and immediately sat.  Pt  impulsive ADL Comments: Pt. eager to go home.  pt. reports he has all needed DME, and has ample support at discharge.  He reports no concerns or issues for home.  Discussed safety with ADLs and using w/c as needed instead of attempting to ambulate.  He agreed    OT Diagnosis:    OT Problem List:   OT Treatment Interventions:     OT Goals    Visit Information  Last OT Received On: 01/05/12 Assistance Needed: +1    Subjective Data  Subjective: "I'll try it, but if it hurts my feet, I'm lying back down"  re: standing Patient Stated Goal: To go home   Prior Functioning     Home Living Lives With:  Orlie Dakin) Available Help at Discharge: Available PRN/intermittently;Personal care attendant Type of Home: House Home Access: Level entry Home Layout: One level Bathroom Shower/Tub:  (Pt sponge bathes) Bathroom Toilet: Standard Bathroom Accessibility: Yes How Accessible: Accessible via wheelchair Home Adaptive Equipment: Bedside commode/3-in-1;Grab bars around toilet;Grab bars in shower;Walker - rolling;Wheelchair - manual Prior Function Level of Independence: Needs assistance Needs Assistance: Bathing;Dressing;Meal Prep;Light Housekeeping;Gait Bath: Moderate Dressing: Moderate Meal Prep: Total Light Housekeeping: Total Gait Assistance: with RW, W/C long distances.   Able to Take Stairs?: No Driving: No Vocation: On disability Communication Communication: No difficulties Dominant Hand: Right         Vision/Perception     Cognition  Overall Cognitive Status: Appears within functional limits for tasks assessed/performed Arousal/Alertness: Awake/alert Orientation Level: Appears intact for tasks assessed Behavior During Session: [facility] for tasks performed Cognition - Other Comments: Pt. with decreased judgement, but likely baseline for this pt    Extremity/Trunk Assessment Right Upper Extremity Assessment RUE ROM/Strength/Tone: Within functional levels RUE Coordination: WFL  - gross/fine motor Left Upper  Extremity Assessment LUE ROM/Strength/Tone: Within functional levels LUE Coordination: WFL - gross/fine motor     Mobility Bed Mobility Bed Mobility: Supine to Sit;Sitting - Scoot to Edge of Bed;Sit to Supine Supine to Sit: 6: Modified independent (Device/Increase time);With rails;HOB flat Sitting - Scoot to Edge of Bed: 5: Supervision Sit to Supine: 6: Modified independent (Device/Increase time);With rail;HOB flat     Shoulder Instructions     Exercise     Balance     End of Session OT - End of Session Activity Tolerance: Patient limited by pain Patient left: in bed;with call bell/phone within reach;with bed alarm set  GO     Crystalmarie Yasin, Ursula Alert M 01/05/2012, 5:44 PM

## 2012-01-05 NOTE — Procedures (Signed)
I have personally attended this patient's dialysis session.  He is receiving HD via permcath with Qb400 and stable BP and HR.  Renal labs pending; plan for dose of darbepoetin for Hb of 8.3 and iron studies pending.  BC X 2 to be done while in HD although afebrile this AM.  Rhianna Raulerson B

## 2012-01-05 NOTE — Progress Notes (Signed)
[facility] OF [place]  Progress Note Bypass Surgery  Date of Surgery: 01/02/2012  Procedure(s): BYPASS GRAFT FEMORAL-POPLITEAL ARTERY AMPUTATION RAY Surgeon: Surgeon(s): [name], MD  3 Days Post-Op  History of Present Illness  [name] is a 61 y.o. male who is S/P Procedure(s): BYPASS GRAFT FEMORAL-POPLITEAL ARTERY AMPUTATION RAY right.  The patient's pre-op symptoms of pain are Improved . Patients pain is well controlled.    VASC. LAB Studies:        ABI: will be done in the office   Imaging: No results found.  Significant Diagnostic Studies: CBC Lab Results  Component Value Date   WBC 6.6 01/05/2012   HGB 8.3* 01/05/2012   HCT 27.0* 01/05/2012   MCV 93.4 01/05/2012   PLT 190 01/05/2012    BMET     Component Value Date/Time   NA 138 01/05/2012 0700   K 4.7 01/05/2012 0700   CL 100 01/05/2012 0700   CO2 28 01/05/2012 0700   GLUCOSE 66* 01/05/2012 0700   BUN 29* 01/05/2012 0700   CREATININE 6.03* 01/05/2012 0700   CALCIUM 8.5 01/05/2012 0700   GFRNONAA 9* 01/05/2012 0700   GFRAA 10* 01/05/2012 0700    COAG Lab Results  Component Value Date   INR 1.17 12/30/2011   INR 1.40 11/05/2011   INR 1.30 09/12/2011   No results found for this basename: PTT    Physical Examination  BP Readings from Last 3 Encounters:  01/05/12 150/53  01/05/12 150/53  12/30/11 160/50   Temp Readings from Last 3 Encounters:  01/05/12 98.2 F (36.8 C) Oral  01/05/12 98.2 F (36.8 C) Oral  12/30/11 98.4 F (36.9 C) Oral   SpO2 Readings from Last 3 Encounters:  01/05/12 97%  01/05/12 97%  12/30/11 100%   Pulse Readings from Last 3 Encounters:  01/05/12 80  01/05/12 80  12/30/11 62   Pt is A&O x 3  right lower extremity: Incision/s is/are clean, dry, intact or clean,dry.intact, and clean, dry, intact or healing  without hematoma, erythema or drainage  Limb is warm; with good color  Right foot Dp monophasic/left DP monophasic.    Left transmeatal wound and heel wound need dressings per wound care team. Foul odor with known MRSA.  Wound vac in place on right groin no recorded output.  Was discharged today wound clean and dry.      Assessment/Plan: Pt. Doing well Post-op pain is controlled Wounds are healing well Continue wound care as ordered Discharge home with family  Clinton Gallant [facility] [phone] 01/05/2012 3:43 PM

## 2012-01-05 NOTE — Discharge Summary (Signed)
[facility] Discharge Summary   Patient ID:  [name] MRN: [id number] DOB/AGE: 1950-05-24 61 y.o.  Admit date: 01/02/2012 Discharge date: 01/05/2012 Date of Surgery: 01/02/2012 Surgeon: Surgeon(s): [name], MD  Admission Diagnosis: PVD WITH TOE ULCER  Discharge Diagnoses:  PVD WITH TOE ULCER  Secondary Diagnoses: Past Medical History  Diagnosis Date  . Hyperlipidemia   . ESRD (end stage renal disease)     a. MWF dialysis in [place] (followed by Dr. Kristian Covey)  . Arthritis   . Claudication   . Stroke 2005  . Anemia   . Chronic diastolic CHF (congestive heart failure) 07/2010    a. 07/2008 Echo EF 50-55%, mild-mod LVH, Gr 1 DD.  Marland Kitchen Peripheral vascular occlusive disease     a. 07/2011 Periph Angio: No signif Ao-illiac dzs, LCF stenosis, 100% LSFA w recon above knee pop and 3 vessel runoff, 100% RSFA w/ recon above knee --> pending L Fem to below Knee pop bypass.  Marland Kitchen History of tobacco abuse   . Hypertension     takes Amlodipine,Metoprolol,and Prinivil daily  . Dyspnea on exertion     with exertion  . GERD (gastroesophageal reflux disease)   . Cataract     bilateral  . Dialysis patient   . PUD (peptic ulcer disease) mid 1990's    with GI bleed.  endoscoped in mid 1990's at [facility] hospital    Procedure(s): BYPASS GRAFT FEMORAL-POPLITEAL ARTERY AMPUTATION RAY  Discharged Condition: good  HPI: The patient is status post left femoral to below knee popliteal artery bypass graft with vein and forefoot amputation for an ulcer. He is recovering nicely from this. He has developed a right foot ulcer. He recently underwent angiography which revealed an occluded superficial femoral artery with reconstitution of the above-knee popliteal artery. I did not feel like he is a good candidate for percutaneous revascularization and therefore he comes in today for surgical bypass. He has previously had vein mapping performed his right greater saphenous vein a  suboptimal    Hospital Course:  [name] is a 61 y.o. male is S/P Right Procedure(s): BYPASS GRAFT FEMORAL-POPLITEAL ARTERY AMPUTATION RAY Extubated: POD # 0 Wound vac was placed over the right groin for 3 days and was discharged prior to going home. Post-op wounds clean, dry, intact or healing well Pt. Ambulating, voiding and taking PO diet without difficulty. Pt pain controlled with PO pain meds. Labs as below Complications:none  Consults:  Treatment Team:  Dyke Maes, MD  Significant Diagnostic Studies: CBC Lab Results  Component Value Date   WBC 6.6 01/05/2012   HGB 8.3* 01/05/2012   HCT 27.0* 01/05/2012   MCV 93.4 01/05/2012   PLT 190 01/05/2012    BMET    Component Value Date/Time   NA 138 01/05/2012 0700   K 4.7 01/05/2012 0700   CL 100 01/05/2012 0700   CO2 28 01/05/2012 0700   GLUCOSE 66* 01/05/2012 0700   BUN 29* 01/05/2012 0700   CREATININE 6.03* 01/05/2012 0700   CALCIUM 8.5 01/05/2012 0700   GFRNONAA 9* 01/05/2012 0700   GFRAA 10* 01/05/2012 0700   COAG Lab Results  Component Value Date   INR 1.17 12/30/2011   INR 1.40 11/05/2011   INR 1.30 09/12/2011     Disposition:  Discharge to :Home Discharge Orders    Future Orders Please Complete By Expires   Resume previous diet      Driving Restrictions      Comments:  No driving for 6 weeks   Lifting restrictions      Comments:   No lifting for 6 weeks   Call MD for:  temperature >100.5      Call MD for:  redness, tenderness, or signs of infection (pain, swelling, bleeding, redness, odor or green/yellow discharge around incision site)      Call MD for:  severe or increased pain, loss or decreased feeling  in affected limb(s)         [name], [name]  Home Medication Instructions [id number]   Printed on:01/05/12 1545  Medication Information                    multivitamin (RENA-VIT) TABS tablet Take 1 tablet by mouth daily.           simvastatin (ZOCOR) 5 MG  tablet Take 5 mg by mouth at bedtime.           gabapentin (NEURONTIN) 100 MG capsule Take 2 capsules (200 mg total) by mouth daily.           aspirin 81 MG chewable tablet Chew 1 tablet (81 mg total) by mouth daily.           darbepoetin (ARANESP) 100 MCG/0.5ML SOLN Inject 0.5 mLs (100 mcg total) into the vein every Monday with hemodialysis.           sevelamer (RENVELA) 800 MG tablet Take 2 tablets (1,600 mg total) by mouth 3 (three) times daily with meals.           atorvastatin (LIPITOR) 10 MG tablet Take 10 mg by mouth at bedtime.           hydrALAZINE (APRESOLINE) 25 MG tablet Take 25 mg by mouth 3 (three) times daily.           isosorbide mononitrate (IMDUR) 60 MG 24 hr tablet Take 60 mg by mouth daily.           cinacalcet (SENSIPAR) 30 MG tablet Take 30 mg by mouth daily.           oxyCODONE (OXY IR/ROXICODONE) 5 MG immediate release tablet Take 1-2 tablets (5-10 mg total) by mouth every 4 (four) hours as needed.            Verbal and written Discharge instructions given to the patient. Wound care per Discharge AVS Follow-up Information    Follow up with Myra Gianotti IV, Lala Lund, MD. In 2 weeks. (sent)    Contact information:   [address] [place] [address] [phone]          Signed: Clinton Gallant [facility] 01/05/2012, 3:45 PM

## 2012-01-05 NOTE — Evaluation (Signed)
Physical Therapy Evaluation Patient Details Name: [name] MRN: [id number] DOB: 1950/08/17 Today's Date: 01/05/2012 Time: 4098-1191 PT Time Calculation (min): 11 min  PT Assessment / Plan / Recommendation Clinical Impression  pt rpesents with Bil Foot wounds, ESRD, and wound vac.  pt well known to this PT and needs tactful encouragement for mobility.  pt notes having all needed DME at home and wants to continue his HHPT.      PT Assessment  Patient needs continued PT services    Follow Up Recommendations  Home health PT;Supervision - Intermittent    Does the patient have the potential to tolerate intense rehabilitation      Barriers to Discharge None      Equipment Recommendations  None recommended by PT    Recommendations for Other Services     Frequency Min 3X/week    Precautions / Restrictions Precautions Precautions: Fall Precaution Comments: pt with wound vac Restrictions Weight Bearing Restrictions: No   Pertinent Vitals/Pain Indicates pain in Bil feet, but does not rate.        Mobility  Bed Mobility Bed Mobility: Supine to Sit;Sitting - Scoot to [place] of Bed;Sit to Supine Supine to Sit: 4: Min assist Sitting - Scoot to Edge of Bed: 5: Supervision Sit to Supine: 6: Modified independent (Device/Increase time) Details for Bed Mobility Assistance: pt needs A with trunk only for supine to sit.   Transfers Transfers: Sit to Stand;Stand to Sit Sit to Stand: 4: Min guard;With upper extremity assist;From bed Stand to Sit: 4: Min guard;With upper extremity assist;To bed Details for Transfer Assistance: pt moves slowly and self-directs mobility.   Ambulation/Gait Ambulation/Gait Assistance: 4: Min assist Ambulation Distance (Feet): 3 Feet Assistive device: 1 person hand held assist Ambulation/Gait Assistance Details: pt takes 3 steps along EOB, but notes Bil feet too painful and needs to return to bed.   Gait Pattern: Step-through pattern;Decreased stride  length;Trunk flexed;Shuffle Stairs: No Wheelchair Mobility Wheelchair Mobility: No    Shoulder Instructions     Exercises     PT Diagnosis: Difficulty walking;Acute pain  PT Problem List: Decreased strength;Decreased activity tolerance;Decreased balance;Decreased mobility;Decreased knowledge of use of DME;Pain PT Treatment Interventions: DME instruction;Gait training;Functional mobility training;Therapeutic activities;Therapeutic exercise;Balance training;Patient/family education   PT Goals Acute Rehab PT Goals PT Goal Formulation: With patient Time For Goal Achievement: 01/19/12 Potential to Achieve Goals: Good Pt will go Supine/Side to Sit: with modified independence PT Goal: Supine/Side to Sit - Progress: Goal set today Pt will go Sit to Supine/Side: with modified independence PT Goal: Sit to Supine/Side - Progress: Goal set today Pt will go Sit to Stand: with modified independence PT Goal: Sit to Stand - Progress: Goal set today Pt will go Stand to Sit: with modified independence PT Goal: Stand to Sit - Progress: Goal set today Pt will Ambulate: 51 - 150 feet;with supervision;with rolling walker PT Goal: Ambulate - Progress: Goal set today  Visit Information  Last PT Received On: 01/05/12 Assistance Needed: +1    Subjective Data  Subjective: I get therapy at home.   Patient Stated Goal: Home today   Prior Functioning  Home Living Lives With:  Orlie Dakin) Available Help at Discharge: Family;Available PRN/intermittently;Personal care attendant Type of Home: House Home Access: Level entry Home Layout: One level Bathroom Accessibility: Yes How Accessible: Accessible via wheelchair Home Adaptive Equipment: Bedside commode/3-in-1;Grab bars around toilet;Grab bars in shower;Walker - rolling;Wheelchair - manual Prior Function Level of Independence: Needs assistance Needs Assistance: Bathing;Dressing;Meal Prep;Light Housekeeping;Gait Bath: Moderate Dressing:  Moderate Meal  Prep: Total Light Housekeeping: Total Gait Assistance: with RW, W/C long distances.   Able to Take Stairs?: No Driving: No Vocation: On disability Communication Communication: No difficulties    Cognition  Overall Cognitive Status: Appears within functional limits for tasks assessed/performed Arousal/Alertness: Awake/alert Orientation Level: Appears intact for tasks assessed Behavior During Session: [facility] for tasks performed    Extremity/Trunk Assessment Right Lower Extremity Assessment RLE ROM/Strength/Tone: Deficits RLE ROM/Strength/Tone Deficits: pt with wounds on feet and painful, but able to move LE actively.   RLE Sensation: History of peripheral neuropathy Left Lower Extremity Assessment LLE ROM/Strength/Tone: Deficits LLE ROM/Strength/Tone Deficits: pt with wound on foot and painful, but able to move LE actively.   LLE Sensation: History of peripheral neuropathy Trunk Assessment Trunk Assessment: Normal   Balance Balance Balance Assessed: No  End of Session PT - End of Session Activity Tolerance: Patient limited by pain Patient left: in bed;with call bell/phone within reach;with bed alarm set Nurse Communication: Mobility status  GP     Sunny Schlein, [place] [phone] 01/05/2012, 2:40 PM

## 2012-01-05 NOTE — Care Management Note (Signed)
    Page 1 of 2   01/05/2012     4:02:27 PM   CARE MANAGEMENT NOTE 01/05/2012  Patient:  [name], [name]   Account Number:  [id number]  Date Initiated:  01/05/2012  Documentation initiated by:  Pankaj Haack  Subjective/Objective Assessment:   PT S/P RT GREAT TOE AMPUTATION AND FEM POP GRAFT ON 01/02/12.  PTA, PT LIVES WITH FAMILY.     Action/Plan:   PT ACTIVE CURRENTLY WITH [facility] FOR WOUND CARE.  WILL FOLLOW FOR ADDITIONAL NEEDS AS PT PROGRESSES.   Anticipated DC Date:  01/05/2012   Anticipated DC Plan:  HOME W HOME HEALTH SERVICES      DC Planning Services  CM consult      [facility] Choice  HOME HEALTH   Choice offered to / List presented to:  C-1 Patient        HH arranged  HH-1 RN      [facility] agency  [facility]   Status of service:  Completed, signed off Medicare Important Message given?   (If response is "NO", the following Medicare IM given date fields will be blank) Date Medicare IM given:   Date Additional Medicare IM given:    Discharge Disposition:  HOME W HOME HEALTH SERVICES  Per UR Regulation:  Reviewed for med. necessity/level of care/duration of stay  If discussed at Long Length of Stay Meetings, dates discussed:    Comments:  01/05/12 Earl Naser,RN,BSN PT FOR DISCHARGE HOME TODAY; WILL NEED CONT HH SERVICES. WILL REFER TO [facility],PER CHOICE FOR HHRN TO FOLLOW FOR CONT WOUND CARE NEEDS.  START OF CARE 24-48H POST DC DATE.

## 2012-01-05 NOTE — Progress Notes (Signed)
Subjective: feels good this morning; feet not hurting  No chills  Temps down  Objective:  Seen on hemodialysis  Physical Exam:  Blood pressure 150/65, pulse 69, temperature 98.4 F (36.9 C), temperature source Oral, resp. rate 20, height 5\' 6"  (1.676 m), weight 60.2 kg (132 lb 11.5 oz), SpO2 97.00%. NAD Left sided permcath Lungs clear Cardiac regular S1S2 no S3 or rub Abdomen soft not tender Trace LE edema right  Both feet heavily bandaged with TMA on left Oriented X 2  Good spiritis Labs: None new from today as of yet except for CBC   Lab 01/03/12 0810 01/02/12 0834 12/30/11 1408 12/30/11 0904 12/30/11 0834  NA 138 140 134* 136 131*  K 5.1 4.6 4.1 4.0 8.2*  CL 99 -- 97 101 102  CO2 26 -- 23 -- --  GLUCOSE 84 75 92 66* 71  BUN 33* -- 24* 23 37*  CREATININE 6.18* -- 4.68* 4.70* 4.50*  ALB -- -- -- -- --  CALCIUM 9.1 -- 8.7 -- --  PHOS -- -- -- -- --     Lab 01/05/12 0700 01/03/12 0810 01/02/12 0834 12/30/11 1408  WBC 6.6 6.0 -- 4.8  NEUTROABS 4.3 -- -- --  HGB 8.3* 9.5* 13.6 13.2  HCT 27.0* 30.1* 40.0 41.0  MCV 93.4 92.6 -- 93.0  PLT 190 230 -- 200       MEDS: . aspirin  81 mg Oral Daily  . atorvastatin  10 mg Oral QHS  . calcium acetate  1,334 mg Oral TID WC  . cinacalcet  30 mg Oral Daily  . docusate sodium  100 mg Oral Daily  . gabapentin  200 mg Oral Daily  . hydrALAZINE  25 mg Oral TID  . isosorbide mononitrate  60 mg Oral Daily  . lanthanum  2,000 mg Oral TID WC  . multivitamin  1 tablet Oral Daily  . sevelamer  1,600 mg Oral TID WC     I  have reviewed scheduled and prn medications.  ASSESSMENT/RECOMMENDATIONS 1. ESRD  MWF  [place]  On schedule  HD today 2. S/p right fem pot and right great toe amput with more remote left TMA - per Dr. Myra Gianotti 3. Sec HPT - no recent phos; on cinacalcet for inc PTH and binders for phos 4. Anemia - post of.  Hb down to 8.3  Was on no EPO pre op.  Give dose of Aranesp  100 mcg w/Mon HD; check iron studies  5. Low grade  temps - afebrile now' getting BC with HD since has pc in but with feeet has ample reason for fevers   Camille Bal, MD [facility] [phone] Pager 01/05/2012, 7:28 AM

## 2012-01-05 NOTE — Progress Notes (Addendum)
01/05/2012 4:16 PM Nursing note Discharge avs form, rx, medications already taken today and those due this evening given and explained to patient and niece. Follow up appointments, when to call MD and advanced home care arrangements discussed. Questions and concerns addressed. Wound care reviewed. Spoke with Lianne Cure, PAC. ABI to be completed at f.u appointment. HD cath in place at discharge. (present at admission). Wound vac removed by PA prior to discharge.  Pt. Verbalized understanding. D/c iv line. D/c tele. D/c home with [facility], Physical therapy, wound care and via pt. Personal wheelchair per orders.  [name], Blanchard Kelch

## 2012-01-06 ENCOUNTER — Other Ambulatory Visit: Payer: Self-pay | Admitting: *Deleted

## 2012-01-06 ENCOUNTER — Telehealth: Payer: Self-pay | Admitting: Surgery

## 2012-01-06 DIAGNOSIS — Z48812 Encounter for surgical aftercare following surgery on the circulatory system: Secondary | ICD-10-CM

## 2012-01-06 DIAGNOSIS — I739 Peripheral vascular disease, unspecified: Secondary | ICD-10-CM

## 2012-01-06 NOTE — Telephone Encounter (Signed)
Spoke with patient to schedule, and sent letter, dpm

## 2012-01-06 NOTE — Telephone Encounter (Signed)
Message copied by Fredrich Birks on Tue Jan 06, 2012  9:52 AM ------      Message from: Melene Plan      Created: Mon Jan 05, 2012  4:57 PM                   ----- Message -----         From: Lars Mage, PA         Sent: 01/05/2012   3:41 PM           To: Melene Plan, RN            F/U with Dr. Myra Gianotti in 2 weeks right fem-pop with toe amputation.  Needs ABI's done that day as well thanks mc

## 2012-01-07 NOTE — Discharge Summary (Signed)
Stable for d/c  [name]

## 2012-01-11 LAB — CULTURE, BLOOD (ROUTINE X 2): Culture: NO GROWTH

## 2012-01-13 ENCOUNTER — Encounter: Payer: Self-pay | Admitting: Gastroenterology

## 2012-01-16 ENCOUNTER — Encounter: Payer: Self-pay | Admitting: Surgery

## 2012-01-19 ENCOUNTER — Ambulatory Visit (INDEPENDENT_AMBULATORY_CARE_PROVIDER_SITE_OTHER): Payer: Medicare Other | Admitting: Surgery

## 2012-01-19 ENCOUNTER — Encounter: Payer: Self-pay | Admitting: Surgery

## 2012-01-19 VITALS — BP 152/67 | HR 77 | Temp 98.2°F | Ht 66.0 in | Wt 126.0 lb

## 2012-01-19 DIAGNOSIS — I739 Peripheral vascular disease, unspecified: Secondary | ICD-10-CM

## 2012-01-19 DIAGNOSIS — L98499 Non-pressure chronic ulcer of skin of other sites with unspecified severity: Secondary | ICD-10-CM

## 2012-01-19 DIAGNOSIS — Z48812 Encounter for surgical aftercare following surgery on the circulatory system: Secondary | ICD-10-CM

## 2012-01-19 NOTE — Progress Notes (Signed)
The patient is here for a 2 week wound check. He underwent right femoral to above-knee popliteal artery bypass graft with Gore-Tex as well as a right great toe amputation on 01/02/2012. He has a history of a left femoral to below-knee popliteal bypass graft with vein and transmetatarsal amputation. He is continuing wound care for the left transmetatarsal amputation site.  The patient's right leg incisions are healing nicely. He does have a fair amount of edema in both legs. The dilatation site is healing nicely.  The patient will come back in 2 weeks for suture removal. He will continue to get wound care at the wound center. It sounds like he is going to start hyperbaric therapy soon. He will come back in 3 months to see me with a duplex of both bypass grafts.

## 2012-01-19 NOTE — Addendum Note (Signed)
Addended by: Sharee Pimple on: 01/19/2012 12:36 PM   Modules accepted: Orders

## 2012-01-26 ENCOUNTER — Other Ambulatory Visit (HOSPITAL_COMMUNITY): Payer: Self-pay | Admitting: Nephrology

## 2012-01-26 ENCOUNTER — Ambulatory Visit (HOSPITAL_COMMUNITY)
Admission: RE | Admit: 2012-01-26 | Discharge: 2012-01-26 | Disposition: A | Discharge status: Home / Self Care | Payer: Medicare Other | Source: Ambulatory Visit | Attending: Nephrology | Admitting: Nephrology

## 2012-01-26 DIAGNOSIS — T82598A Other mechanical complication of other cardiac and vascular devices and implants, initial encounter: Secondary | ICD-10-CM | POA: Insufficient documentation

## 2012-01-26 DIAGNOSIS — Y849 Medical procedure, unspecified as the cause of abnormal reaction of the patient, or of later complication, without mention of misadventure at the time of the procedure: Secondary | ICD-10-CM | POA: Insufficient documentation

## 2012-01-26 DIAGNOSIS — I5032 Chronic diastolic (congestive) heart failure: Secondary | ICD-10-CM | POA: Insufficient documentation

## 2012-01-26 DIAGNOSIS — Z79899 Other long term (current) drug therapy: Secondary | ICD-10-CM | POA: Insufficient documentation

## 2012-01-26 DIAGNOSIS — K219 Gastro-esophageal reflux disease without esophagitis: Secondary | ICD-10-CM | POA: Insufficient documentation

## 2012-01-26 DIAGNOSIS — I120 Hypertensive chronic kidney disease with stage 5 chronic kidney disease or end stage renal disease: Secondary | ICD-10-CM | POA: Insufficient documentation

## 2012-01-26 DIAGNOSIS — N186 End stage renal disease: Secondary | ICD-10-CM

## 2012-01-26 DIAGNOSIS — Z452 Encounter for adjustment and management of vascular access device: Secondary | ICD-10-CM | POA: Insufficient documentation

## 2012-01-26 DIAGNOSIS — E785 Hyperlipidemia, unspecified: Secondary | ICD-10-CM | POA: Insufficient documentation

## 2012-01-26 DIAGNOSIS — I509 Heart failure, unspecified: Secondary | ICD-10-CM | POA: Insufficient documentation

## 2012-01-26 DIAGNOSIS — Z8673 Personal history of transient ischemic attack (TIA), and cerebral infarction without residual deficits: Secondary | ICD-10-CM | POA: Insufficient documentation

## 2012-01-26 MED ORDER — CEFAZOLIN SODIUM 1-5 GM-% IV SOLN
1.0000 g | Freq: Once | INTRAVENOUS | Status: AC
  Administered 2012-01-26: 1 g via INTRAVENOUS

## 2012-01-26 MED ORDER — HEPARIN SODIUM (PORCINE) 1000 UNIT/ML IJ SOLN
INTRAMUSCULAR | Status: AC
  Filled 2012-01-26: qty 1

## 2012-01-26 MED ORDER — CEFAZOLIN SODIUM 1-5 GM-% IV SOLN
INTRAVENOUS | Status: AC
  Filled 2012-01-26: qty 50

## 2012-01-26 NOTE — Procedures (Signed)
Successful fluoroscopic guided exchange of existing left jugular approach dialysis catheter.  Catheter is ready for immediate use.  No immediate post procedural complications.   

## 2012-01-26 NOTE — H&P (Signed)
[name] is an 61 y.o. male.   Chief Complaint: left IJ HD cath not working well Recently exchanged out on 9/20 for new cath. Has been working well until this past week. Scheduled for another exchange HPI: HLD; ESRD; CVA; anemia; PVD; HTN; Cataracts Recently had right Fem-pop bypass graft and amp of rt great tow. He states he is doing well from that.  Past Medical History  Diagnosis Date  . Hyperlipidemia   . ESRD (end stage renal disease)     a. MWF dialysis in [place] (followed by Dr. Kristian Covey)  . Arthritis   . Claudication   . Stroke 2005  . Anemia   . Chronic diastolic CHF (congestive heart failure) 07/2010    a. 07/2008 Echo EF 50-55%, mild-mod LVH, Gr 1 DD.  Marland Kitchen Peripheral vascular occlusive disease     a. 07/2011 Periph Angio: No signif Ao-illiac dzs, LCF stenosis, 100% LSFA w recon above knee pop and 3 vessel runoff, 100% RSFA w/ recon above knee --> pending L Fem to below Knee pop bypass.  Marland Kitchen History of tobacco abuse   . Hypertension     takes Amlodipine,Metoprolol,and Prinivil daily  . Dyspnea on exertion     with exertion  . GERD (gastroesophageal reflux disease)   . Cataract     bilateral  . Dialysis patient   . PUD (peptic ulcer disease) mid 1990's    with GI bleed.  endoscoped in mid 1990's at [facility]    Past Surgical History  Procedure Date  . Arteriovenous graft placement   . Av fistula placement 12/10/2000    Right brachiocephalic arteriovenous   . Hernia repair   . Aortagram 07/29/2011    Abdominal Aortagram  . Cardiac catheterization 08/01/11    Left heart catheterization  . Femoral-popliteal bypass graft 09/09/2011    Procedure: BYPASS GRAFT FEMORAL-POPLITEAL ARTERY;  Surgeon: [name], MD;  Location: [facility] OR;  Service: Vascular;  Laterality: Left;  . Amputation 09/12/2011    Procedure: AMPUTATION BELOW KNEE;  Surgeon: [name], MD;  Location: [facility] OR;  Service: Vascular;  Laterality: Left;  TRANSMETATARSAL  . Application of wound vac  10/30/2011    Procedure: APPLICATION OF WOUND VAC;  Surgeon: [name], MD;  Location: [facility] OR;  Service: Vascular;  Laterality: Left;  . Groin debridement 11/01/2011    Procedure: Drucie Ip DEBRIDEMENT;  Surgeon: Chuck Hint, MD;  Location: [facility] OR;  Service: Vascular;  Laterality: Left;  . Esophagogastroduodenoscopy 11/06/2011    Procedure: ESOPHAGOGASTRODUODENOSCOPY (EGD);  Surgeon: Beverley Fiedler, MD;  Location: [facility] ENDOSCOPY;  Service: Gastroenterology;  Laterality: N/A;  . Femoral-popliteal bypass graft 01/02/2012    Procedure: BYPASS GRAFT FEMORAL-POPLITEAL ARTERY;  Surgeon: [name], MD;  Location: [facility] OR;  Service: Vascular;  Laterality: Right;  . Amputation 01/02/2012    Procedure: AMPUTATION RAY;  Surgeon: [name], MD;  Location[facility] OR;  Service: Vascular;  Laterality: Right;    Family History  Problem Relation Age of Onset  . Breast cancer Mother   . Cancer Father   . Anesthesia problems Neg Hx   . Hypotension Neg Hx   . Malignant hyperthermia Neg Hx   . Pseudochol deficiency Neg Hx    Social History:  reports that he has been smoking Cigarettes.  He has a 20 pack-year smoking history. He has never used smokeless tobacco. He reports that he does not drink alcohol or use illicit drugs.  Allergies: No Known Allergies  Meds:  aspirin 81 MG chewable tablet (Taking) 30 tablet 0 11/07/2011 11/06/2012 Sig - Route: Chew 1 tablet (81 mg total) by mouth daily. - Oral Class: No Print Number of times this order has been changed since signing: 1 Order [address] atorvastatin (LIPITOR) 10 MG tablet (Taking) Sig - Route: Take 10 mg by mouth at bedtime. - Oral Class: Historical Med cinacalcet (SENSIPAR) 30 MG tablet (Taking) Sig - Route: Take 30 mg by mouth daily. - Oral Class: Historical Med darbepoetin (ARANESP) 100 MCG/0.5ML SOLN (Taking) 4.2 mL 0 11/07/2011 Sig - Route: Inject 0.5 mLs (100 mcg total) into the vein every Monday with hemodialysis. - Intravenous Class: No Print  Number of times this order has been changed since signing: 1 Order [address] hydrALAZINE (APRESOLINE) 25 MG tablet (Taking) Sig - Route: Take 25 mg by mouth 3 (three) times daily. - Oral Class: Historical Med isosorbide mononitrate (IMDUR) 60 MG 24 hr tablet (Taking) Sig - Route: Take 60 mg by mouth daily. - Oral Class: Historical Med multivitamin (RENA-VIT) TABS tablet (Taking) Sig - Route: Take 1 tablet by mouth daily. - Oral Class: Historical Med Number of times this order has been changed since signing: 6 Order [address] sevelamer (RENVELA) 800 MG tablet (Taking) 180 tablet 0 11/07/2011 11/06/2012 Sig - Route: Take 2 tablets (1,600 mg total) by mouth 3 (three) times daily with meals. - Oral Class: No Print Number of times this order has been changed since signing: 1 Order [address] simvastatin (ZOCOR) 5 MG tablet (Taking) Sig - Route: Take 5 mg by mouth at bedtime. - Oral Class: Historical Med Number of times this order has been changed since signing: 4 Order [address] gabapentin (NEURONTIN) 100 MG capsule 60 capsule 1 09/26/2011 Sig - Route: Take 2 capsules (200 mg total) by mouth daily. - Oral Class: Print Number of times this order has been changed since signing: 3 Order [address] oxyCODONE (OXY IR/ROXICODONE) 5 MG immediate release tablet 30 tablet 0 01/05/2012 Sig - Route: Take 1-2 tablets (5-10 mg total) by mouth every 4 (four) hours as needed.  Review of Systems  Constitutional: Negative for fever and chills.  Respiratory: Negative for cough and shortness of breath.   Cardiovascular: Negative for chest pain.  Gastrointestinal: Negative for nausea and vomiting.  Neurological: Positive for weakness. Negative for headaches.   Physical Exam : Constitutional: He is oriented to person, place, and time.  Neck:       Left perm cath in place  Cardiovascular: Normal rate, regular rhythm and normal heart sounds.   Respiratory: Effort normal and breath sounds normal. He has no wheezes.  GI: Soft.  Bowel sounds are normal. There is no tenderness.  Musculoskeletal: Normal range of motion.  Neurological: He is alert and oriented to person, place, and time.     Assessment/Plan Left IJ Hemodialysis catheter in place. Scheduled now for catheter exchange vs replacement Pt aware of procedure benefits and risks and agreeable to proceed Consent signed and in chart  Brayton El PA_C 01/26/2012, 2:45 PM

## 2012-01-30 ENCOUNTER — Encounter: Payer: Self-pay | Admitting: Neurosurgery

## 2012-02-02 ENCOUNTER — Ambulatory Visit (INDEPENDENT_AMBULATORY_CARE_PROVIDER_SITE_OTHER): Payer: Medicare Other | Admitting: Neurosurgery

## 2012-02-02 ENCOUNTER — Encounter: Payer: Self-pay | Admitting: Neurosurgery

## 2012-02-02 VITALS — BP 174/69 | HR 68 | Resp 16 | Ht 66.0 in | Wt 130.0 lb

## 2012-02-02 DIAGNOSIS — I739 Peripheral vascular disease, unspecified: Secondary | ICD-10-CM

## 2012-02-02 DIAGNOSIS — L98499 Non-pressure chronic ulcer of skin of other sites with unspecified severity: Secondary | ICD-10-CM

## 2012-02-02 DIAGNOSIS — Z4802 Encounter for removal of sutures: Secondary | ICD-10-CM

## 2012-02-02 NOTE — Progress Notes (Signed)
Subjective:     Patient ID: [name], male   DOB: 05/27/1950, 61 y.o.   MRN: [id number]  HPI: 61 year old male patient of Dr. Myra Gianotti that is status post right femoral to above-knee popliteal artery bypass graft with Gore-Tex as well as a right great toe amputation on 01/02/2012. He has a history of a left femoral to below-knee popliteal bypass graft with vein and transmetatarsal amputation. Per Dr. Myra Gianotti the patient comes in today for suture removal and has no complaints.     Review of Systems: 12 point review of systems is notable for the difficulties described above otherwise unremarkable    Objective:   Physical Exam: Afebrile, vital signs are stable, right great toe amputation site is well-healed there is eschar but no drainage and the sutures are ready to be removed.     Assessment:     This is a patient that is status post the above procedure and has no pain complaints. Wound is well healed and the sutures will be removed today. The patient states that he has no pain and is doing well.    Plan:     The patient return in January as scheduled see Dr. Myra Gianotti with duplex studies of his bypass grafts. The patient's questions were encouraged and answered, he is in agreement with this plan.  [name] ANP  Clinic M.D.: Myra Gianotti

## 2012-04-16 ENCOUNTER — Encounter: Payer: Self-pay | Admitting: Surgery

## 2012-04-19 ENCOUNTER — Ambulatory Visit: Payer: Medicare Other | Admitting: Surgery

## 2012-04-30 ENCOUNTER — Encounter: Payer: Self-pay | Admitting: Surgery

## 2012-05-03 ENCOUNTER — Encounter (INDEPENDENT_AMBULATORY_CARE_PROVIDER_SITE_OTHER): Payer: Medicare Other | Admitting: *Deleted

## 2012-05-03 ENCOUNTER — Other Ambulatory Visit: Payer: Self-pay | Admitting: *Deleted

## 2012-05-03 ENCOUNTER — Ambulatory Visit (INDEPENDENT_AMBULATORY_CARE_PROVIDER_SITE_OTHER): Payer: Medicare Other | Admitting: Surgery

## 2012-05-03 ENCOUNTER — Encounter: Payer: Self-pay | Admitting: Surgery

## 2012-05-03 VITALS — BP 188/80 | HR 78 | Ht 66.0 in | Wt 154.0 lb

## 2012-05-03 DIAGNOSIS — Z48812 Encounter for surgical aftercare following surgery on the circulatory system: Secondary | ICD-10-CM

## 2012-05-03 DIAGNOSIS — I739 Peripheral vascular disease, unspecified: Secondary | ICD-10-CM

## 2012-05-03 NOTE — Progress Notes (Signed)
[facility] [place]   Patient name: [name] MRN: [id number] DOB: August 24, 1950 Sex: male     Chief Complaint  Patient presents with  . Re-evaluation    3 month f/u     HISTORY OF PRESENT ILLNESS: The patient is back today for followup. He is status post right femoral to above-knee popliteal artery bypass graft with great toe amputation on 01/02/2012. He is also status post left femoral to below knee popliteal bypass graft on 08/15/2011 and left transmetatarsal amputation on 09/12/2011. He is back today for followup. He has no complaints at this time. He does state that there has been a swelling in his right thigh. He denies fevers or chills.  Past Medical History  Diagnosis Date  . Hyperlipidemia   . ESRD (end stage renal disease)     a. MWF dialysis in [place] (followed by Dr. Kristian Covey)  . Arthritis   . Claudication   . Stroke 2005  . Anemia   . Chronic diastolic CHF (congestive heart failure) 07/2010    a. 07/2008 Echo EF 50-55%, mild-mod LVH, Gr 1 DD.  Marland Kitchen Peripheral vascular occlusive disease     a. 07/2011 Periph Angio: No signif Ao-illiac dzs, LCF stenosis, 100% LSFA w recon above knee pop and 3 vessel runoff, 100% RSFA w/ recon above knee --> pending L Fem to below Knee pop bypass.  Marland Kitchen History of tobacco abuse   . Hypertension     takes Amlodipine,Metoprolol,and Prinivil daily  . Dyspnea on exertion     with exertion  . GERD (gastroesophageal reflux disease)   . Cataract     bilateral  . Dialysis patient   . PUD (peptic ulcer disease) mid 1990's    with GI bleed.  endoscoped in mid 1990's at [facility]    Past Surgical History  Procedure Laterality Date  . Arteriovenous graft placement    . Av fistula placement  12/10/2000    Right brachiocephalic arteriovenous   . Hernia repair    . Aortagram  07/29/2011    Abdominal Aortagram  . Cardiac catheterization  08/01/11    Left heart catheterization  . Femoral-popliteal bypass graft   09/09/2011    Procedure: BYPASS GRAFT FEMORAL-POPLITEAL ARTERY;  Surgeon: [name], MD;  Location: [facility] OR;  Service: Vascular;  Laterality: Left;  . Amputation  09/12/2011    Procedure: AMPUTATION BELOW KNEE;  Surgeon: [name], MD;  Location: [facility] OR;  Service: Vascular;  Laterality: Left;  TRANSMETATARSAL  . Application of wound vac  10/30/2011    Procedure: APPLICATION OF WOUND VAC;  Surgeon: [name], MD;  Location: [facility] OR;  Service: Vascular;  Laterality: Left;  . Groin debridement  11/01/2011    Procedure: Drucie Ip DEBRIDEMENT;  Surgeon: Chuck Hint, MD;  Location: [facility] OR;  Service: Vascular;  Laterality: Left;  . Esophagogastroduodenoscopy  11/06/2011    Procedure: ESOPHAGOGASTRODUODENOSCOPY (EGD);  Surgeon: Beverley Fiedler, MD;  Location: [facility] ENDOSCOPY;  Service: Gastroenterology;  Laterality: N/A;  . Femoral-popliteal bypass graft  01/02/2012    Procedure: BYPASS GRAFT FEMORAL-POPLITEAL ARTERY;  Surgeon: [name], MD;  Location: [facility] OR;  Service: Vascular;  Laterality: Right;  . Amputation  01/02/2012    Procedure: AMPUTATION RAY;  Surgeon: [name], MD;  Location: [facility] OR;  Service: Vascular;  Laterality: Right;    History   Social History  . Marital Status: Legally Separated    Spouse Name: N/A    Number of  Children: N/A  . Years of Education: N/A   Occupational History  . DISABLED    Social History Main Topics  . Smoking status: Current Every Day Smoker -- 0.30 packs/day for 40 years    Types: Cigarettes  . Smokeless tobacco: Never Used     Comment: pt was given 1-800-QUIT-NOW  . Alcohol Use: No  . Drug Use: No  . Sexually Active: Not Currently   Other Topics Concern  . Not on file   Social History Narrative   Lives in [place].  His cousin helps him out and drives him to appts. He quit smoking earlier this year.    Family History  Problem Relation Age of Onset  . Breast cancer Mother   . Cancer Mother   . Cancer Father   . Anesthesia  problems Neg Hx   . Hypotension Neg Hx   . Malignant hyperthermia Neg Hx   . Pseudochol deficiency Neg Hx     Allergies as of 05/03/2012 - Review Complete 05/03/2012  Allergen Reaction Noted  . Ambien (zolpidem tartrate) Other (See Comments) 01/26/2012    Current Outpatient Prescriptions on File Prior to Visit  Medication Sig Dispense Refill  . aspirin 81 MG chewable tablet Chew 1 tablet (81 mg total) by mouth daily.  30 tablet  0  . atorvastatin (LIPITOR) 10 MG tablet Take 10 mg by mouth at bedtime.      . cinacalcet (SENSIPAR) 30 MG tablet Take 30 mg by mouth daily.      . darbepoetin (ARANESP) 100 MCG/0.5ML SOLN Inject 0.5 mLs (100 mcg total) into the vein every Monday with hemodialysis.  4.2 mL  0  . gabapentin (NEURONTIN) 100 MG capsule Take 2 capsules (200 mg total) by mouth daily.  60 capsule  1  . hydrALAZINE (APRESOLINE) 25 MG tablet Take 25 mg by mouth 3 (three) times daily.      . multivitamin (RENA-VIT) TABS tablet Take 1 tablet by mouth daily.      . sevelamer (RENVELA) 800 MG tablet Take 2 tablets (1,600 mg total) by mouth 3 (three) times daily with meals.  180 tablet  0  . simvastatin (ZOCOR) 5 MG tablet Take 5 mg by mouth at bedtime.      . isosorbide mononitrate (IMDUR) 60 MG 24 hr tablet Take 60 mg by mouth daily.      Marland Kitchen oxyCODONE (OXY IR/ROXICODONE) 5 MG immediate release tablet Take 1-2 tablets (5-10 mg total) by mouth every 4 (four) hours as needed.  30 tablet  0   No current facility-administered medications on file prior to visit.     REVIEW OF SYSTEMS: Please see history of present illness. Otherwise no changes since his previous visit, as documented by the patient and the encounter form  PHYSICAL EXAMINATION:   Vital signs are BP 188/80  Pulse 78  Ht 5\' 6"  (1.676 m)  Wt 154 lb (69.854 kg)  BMI 24.87 kg/m2  SpO2 100% General: The patient appears their stated age. HEENT:  No gross abnormalities Pulmonary:  Non labored breathing Musculoskeletal: There  are no major deformities. Neurologic: No focal weakness or paresthesias are detected, Skin: There are no ulcer or rashes noted. Both amputation sites have healed. He does have an eschar over the right great toe amputation site without open areas. There is no evidence of infection. Psychiatric: The patient has normal affect. Cardiovascular: There is a regular rate and rhythm without significant murmur appreciated. Pedal pulses are not palpable. There is a large  presumed seroma at the right above-knee incision without evidence of infection   Diagnostic Studies Duplex ultrasound today shows biphasic waveforms bilaterally. There is an area of elevation on the right bypass graft. Peak velocity is 284 cm/s. A 9 x 7 cm collection is identified on the medial right leg  Assessment: Status post bilateral bypass graft for limb salvage Plan: The patient is doing very well. He has no complaints at this time. I will need to pay close attention to the velocity elevation at the origin of the right femoral-popliteal bypass graft. I discussed the findings of the seroma on the right thigh with the patient. This does not bother him at this time. I have elected to continue with observation. There is no evidence of infection. If it gets larger I will have to consider surgical evacuation. He will follow up in 6 months with a repeat duplex and ABIs  V. Charlena Cross, M.D. [facility] [place] Office: [phone] Pager:  [phone]

## 2012-06-18 ENCOUNTER — Encounter: Payer: Self-pay | Admitting: Surgery

## 2012-06-21 ENCOUNTER — Encounter: Payer: Self-pay | Admitting: Surgery

## 2012-06-21 ENCOUNTER — Ambulatory Visit (INDEPENDENT_AMBULATORY_CARE_PROVIDER_SITE_OTHER): Payer: Medicare Other | Admitting: Surgery

## 2012-06-21 ENCOUNTER — Encounter (INDEPENDENT_AMBULATORY_CARE_PROVIDER_SITE_OTHER): Payer: Medicare Other | Admitting: *Deleted

## 2012-06-21 ENCOUNTER — Other Ambulatory Visit: Payer: Self-pay | Admitting: *Deleted

## 2012-06-21 ENCOUNTER — Encounter: Payer: Self-pay | Admitting: *Deleted

## 2012-06-21 VITALS — BP 152/44 | HR 71 | Ht 66.0 in | Wt 154.0 lb

## 2012-06-21 DIAGNOSIS — G8918 Other acute postprocedural pain: Secondary | ICD-10-CM

## 2012-06-21 DIAGNOSIS — N186 End stage renal disease: Secondary | ICD-10-CM

## 2012-06-21 DIAGNOSIS — Z01810 Encounter for preprocedural cardiovascular examination: Secondary | ICD-10-CM

## 2012-06-21 MED ORDER — OXYCODONE-ACETAMINOPHEN 5-325 MG PO TABS: 1.0000 | ORAL_TABLET | Freq: Four times a day (QID) | ORAL | Status: DC | PRN

## 2012-06-21 NOTE — Addendum Note (Signed)
Addended by: Sharee Pimple on: 06/21/2012 02:17 PM   Modules accepted: Orders

## 2012-06-21 NOTE — Progress Notes (Signed)
[facility]ist [facility] [place]   Patient name: [name] MRN: [id number] DOB: 12/26/1950 Sex: male     Chief Complaint  Patient presents with  . Re-evaluation    eval for possible access placement    HISTORY OF PRESENT ILLNESS: The patient comes in today for dialysis planning. He is well known to me having undergone bilateral femoral-popliteal bypass grafting. He has a history of bilateral fistula. He has been using a catheter for the past several months. 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 He is complaining of significant pain in his legs however his wounds are well-healed  Past Medical History  Diagnosis Date  . Hyperlipidemia   . ESRD (end stage renal disease)     a. MWF dialysis in [place] (followed by Dr. Befekadu)  . Arthritis   . Claudication   . Stroke 2005  . Anemia   . Chronic diastolic CHF (congestive heart failure) 07/2010    a. 07/2008 Echo EF 50-55%, mild-mod LVH, Gr 1 DD.  . Peripheral vascular occlusive disease     a. 07/2011 Periph Angio: No signif Ao-illiac dzs, LCF stenosis, 100% LSFA w recon above knee pop and 3 vessel runoff, 100% RSFA w/ recon above knee --> pending L Fem to below Knee pop bypass.  . History of tobacco abuse   . Hypertension     takes Amlodipine,Metoprolol,and Prinivil daily  . Dyspnea on exertion     with exertion  . GERD (gastroesophageal reflux disease)   . Cataract     bilateral  . Dialysis patient   . PUD (peptic ulcer disease) mid 1990's    with GI bleed.  endoscoped in mid 1990's at [facility]    Past Surgical History  Procedure Laterality Date  . Arteriovenous graft placement    . Av fistula placement  12/10/2000    Right brachiocephalic arteriovenous   . Hernia repair    . Aortagram  07/29/2011    Abdominal Aortagram  . Cardiac catheterization  08/01/11    Left heart catheterization  . Femoral-popliteal bypass graft  09/09/2011    Procedure: BYPASS GRAFT  FEMORAL-POPLITEAL ARTERY;  Surgeon: Wenona Mayville W Litzy Dicker, MD;  Location: [facility] OR;  Service: Vascular;  Laterality: Left;  . Amputation  09/12/2011    Procedure: AMPUTATION BELOW KNEE;  Surgeon: Starkeisha Vanwinkle W Osias Resnick, MD;  Location: [facility] OR;  Service: Vascular;  Laterality: Left;  TRANSMETATARSAL  . Application of wound vac  10/30/2011    Procedure: APPLICATION OF WOUND VAC;  Surgeon: Le Faulcon W Arlin Sass, MD;  Location: [facility] OR;  Service: Vascular;  Laterality: Left;  . Groin debridement  11/01/2011    Procedure: GROIN DEBRIDEMENT;  Surgeon: Christopher S Dickson, MD;  Location: [facility] OR;  Service: Vascular;  Laterality: Left;  . Esophagogastroduodenoscopy  11/06/2011    Procedure: ESOPHAGOGASTRODUODENOSCOPY (EGD);  Surgeon: Jay M Pyrtle, MD;  Location: [facility] ENDOSCOPY;  Service: Gastroenterology;  Laterality: N/A;  . Femoral-popliteal bypass graft  01/02/2012    Procedure: BYPASS GRAFT FEMORAL-POPLITEAL ARTERY;  Surgeon: Chick Cousins W Clerance Umland, MD;  Location: [facility] OR;  Service: Vascular;  Laterality: Right;  . Amputation  01/02/2012    Procedure: AMPUTATION RAY;  Surgeon: Sumaya Riedesel W Cameshia Cressman, MD;  Location: [facility] OR;  Service: Vascular;  Laterality: Right;    History   Social History  . Marital Status: Legally Separated    Spouse Name: N/A    Number of Children: N/A  . Years of Education: N/A     Occupational History  . DISABLED    Social History Main Topics  . Smoking status: Current Every Day Smoker -- 0.30 packs/day for 40 years    Types: Cigarettes  . Smokeless tobacco: Never Used     Comment: pt was given 1-800-QUIT-NOW  . Alcohol Use: No  . Drug Use: No  . Sexually Active: Not Currently   Other Topics Concern  . Not on file   Social History Narrative   Lives in [place].  His cousin helps him out and drives him to appts. He quit smoking earlier this year.    Family History  Problem Relation Age of Onset  . Breast cancer Mother   . Cancer Mother   . Cancer Father   . Anesthesia problems Neg Hx   . Hypotension Neg  Hx   . Malignant hyperthermia Neg Hx   . Pseudochol deficiency Neg Hx     Allergies as of 06/21/2012 - Review Complete 06/21/2012  Allergen Reaction Noted  . Ambien (zolpidem tartrate) Other (See Comments) 01/26/2012    Current Outpatient Prescriptions on File Prior to Visit  Medication Sig Dispense Refill  . ALPRAZolam (XANAX) 1 MG tablet Take 1 tablet by mouth daily.      . cinacalcet (SENSIPAR) 30 MG tablet Take 30 mg by mouth daily.      . hydrALAZINE (APRESOLINE) 25 MG tablet Take 25 mg by mouth 3 (three) times daily.      . isosorbide dinitrate (ISORDIL) 30 MG tablet Take 1 tablet by mouth.      . multivitamin (RENA-VIT) TABS tablet Take 1 tablet by mouth daily.      . simvastatin (ZOCOR) 5 MG tablet Take 5 mg by mouth at bedtime.      . aspirin 81 MG chewable tablet Chew 1 tablet (81 mg total) by mouth daily.  30 tablet  0  . atorvastatin (LIPITOR) 10 MG tablet Take 10 mg by mouth at bedtime.      . darbepoetin (ARANESP) 100 MCG/0.5ML SOLN Inject 0.5 mLs (100 mcg total) into the vein every Monday with hemodialysis.  4.2 mL  0  . FOSRENOL 1000 MG chewable tablet Chew by mouth daily.      . gabapentin (NEURONTIN) 100 MG capsule Take 2 capsules (200 mg total) by mouth daily.  60 capsule  1  . HYDROcodone-acetaminophen (NORCO) 10-325 MG per tablet Take 1 tablet by mouth as needed.      . isosorbide mononitrate (IMDUR) 60 MG 24 hr tablet Take 60 mg by mouth daily.      . oxyCODONE (OXY IR/ROXICODONE) 5 MG immediate release tablet Take 1-2 tablets (5-10 mg total) by mouth every 4 (four) hours as needed.  30 tablet  0  . sevelamer (RENVELA) 800 MG tablet Take 2 tablets (1,600 mg total) by mouth 3 (three) times daily with meals.  180 tablet  0   No current facility-administered medications on file prior to visit.     REVIEW OF SYSTEMS: See history of present illness, all other systems negative  PHYSICAL EXAMINATION:   Vital signs are BP 152/44  Pulse 71  Ht 5' 6" (1.676 m)  Wt  154 lb (69.854 kg)  BMI 24.87 kg/m2  SpO2 100% General: The patient appears their stated age. HEENT:  No gross abnormalities Pulmonary:  Non labored breathing Musculoskeletal: There are no major deformities. Neurologic: No focal weakness or paresthesias are detected, Skin: There are no ulcer or rashes noted. Psychiatric: The patient has normal affect. Cardiovascular: There   is a regular rate and rhythm without significant murmur appreciated.   Diagnostic Studies Vein mapping was ordered and reviewed. He does not have an adequate cephalic or basilic vein bilaterally.  Assessment: End-stage renal disease Plan: I for that I can proceed with a graft in the patient's left forearm were potential the right forearm, however I would like to give bilateral upper extremity vein mapping to help make this decision. This is been scheduled for April 9  V. Wells Tyreece Gelles IV, M.D. [facility] [place] Office: [phone] Pager:  [phone]    

## 2012-06-25 ENCOUNTER — Encounter (HOSPITAL_COMMUNITY): Payer: Self-pay

## 2012-06-30 ENCOUNTER — Encounter (HOSPITAL_COMMUNITY)
Admission: RE | Disposition: A | Discharge status: Home / Self Care | Payer: Self-pay | Source: Ambulatory Visit | Attending: Surgery

## 2012-06-30 ENCOUNTER — Ambulatory Visit (HOSPITAL_COMMUNITY)
Admission: RE | Admit: 2012-06-30 | Discharge: 2012-06-30 | Disposition: A | Discharge status: Home / Self Care | Payer: Medicare Other | Source: Ambulatory Visit | Attending: Surgery | Admitting: Surgery

## 2012-06-30 DIAGNOSIS — E785 Hyperlipidemia, unspecified: Secondary | ICD-10-CM | POA: Insufficient documentation

## 2012-06-30 DIAGNOSIS — I739 Peripheral vascular disease, unspecified: Secondary | ICD-10-CM | POA: Insufficient documentation

## 2012-06-30 DIAGNOSIS — I5032 Chronic diastolic (congestive) heart failure: Secondary | ICD-10-CM | POA: Insufficient documentation

## 2012-06-30 DIAGNOSIS — F17200 Nicotine dependence, unspecified, uncomplicated: Secondary | ICD-10-CM | POA: Insufficient documentation

## 2012-06-30 DIAGNOSIS — Z7982 Long term (current) use of aspirin: Secondary | ICD-10-CM | POA: Insufficient documentation

## 2012-06-30 DIAGNOSIS — N186 End stage renal disease: Secondary | ICD-10-CM | POA: Insufficient documentation

## 2012-06-30 DIAGNOSIS — I509 Heart failure, unspecified: Secondary | ICD-10-CM | POA: Insufficient documentation

## 2012-06-30 DIAGNOSIS — I120 Hypertensive chronic kidney disease with stage 5 chronic kidney disease or end stage renal disease: Secondary | ICD-10-CM | POA: Insufficient documentation

## 2012-06-30 DIAGNOSIS — I871 Compression of vein: Secondary | ICD-10-CM | POA: Insufficient documentation

## 2012-06-30 DIAGNOSIS — K219 Gastro-esophageal reflux disease without esophagitis: Secondary | ICD-10-CM | POA: Insufficient documentation

## 2012-06-30 DIAGNOSIS — Z8673 Personal history of transient ischemic attack (TIA), and cerebral infarction without residual deficits: Secondary | ICD-10-CM | POA: Insufficient documentation

## 2012-06-30 DIAGNOSIS — D649 Anemia, unspecified: Secondary | ICD-10-CM | POA: Insufficient documentation

## 2012-06-30 DIAGNOSIS — M129 Arthropathy, unspecified: Secondary | ICD-10-CM | POA: Insufficient documentation

## 2012-06-30 DIAGNOSIS — Z79899 Other long term (current) drug therapy: Secondary | ICD-10-CM | POA: Insufficient documentation

## 2012-06-30 HISTORY — PX: VENOGRAM: SHX5497

## 2012-06-30 LAB — POCT I-STAT, CHEM 8
Calcium, Ion: 1.23 mmol/L (ref 1.13–1.30)
Chloride: 102 mEq/L (ref 96–112)
Glucose, Bld: 91 mg/dL (ref 70–99)
HCT: 40 % (ref 39.0–52.0)
Hemoglobin: 13.6 g/dL (ref 13.0–17.0)
TCO2: 26 mmol/L (ref 0–100)

## 2012-06-30 SURGERY — VENOGRAM
Anesthesia: LOCAL

## 2012-06-30 MED ORDER — SODIUM CHLORIDE 0.9 % IV SOLN
INTRAVENOUS | Status: AC
  Filled 2012-06-30: qty 250

## 2012-06-30 MED ORDER — SODIUM CHLORIDE 0.9 % IJ SOLN: 3.0000 mL | INTRAMUSCULAR | Status: DC | PRN

## 2012-06-30 NOTE — H&P (View-Only) (Signed)
V[facility]in Specialist of [place]   Patient name: [name] MRN: [id number] DOB: Sep 20, 1950 Sex: male     Chief Complaint  Patient presents with  . Re-evaluation    eval for possible access placement    HISTORY OF PRESENT ILLNESS: The patient comes in today for dialysis planning. He is well known to me having undergone bilateral femoral-popliteal bypass grafting. He has a history of bilateral fistula. He has been using a catheter for the past several months. 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 He is complaining of significant pain in his legs however his wounds are well-healed  Past Medical History  Diagnosis Date  . Hyperlipidemia   . ESRD (end stage renal disease)     a. MWF dialysis in [place] (followed by Dr. Kristian Covey)  . Arthritis   . Claudication   . Stroke 2005  . Anemia   . Chronic diastolic CHF (congestive heart failure) 07/2010    a. 07/2008 Echo EF 50-55%, mild-mod LVH, Gr 1 DD.  Marland Kitchen Peripheral vascular occlusive disease     a. 07/2011 Periph Angio: No signif Ao-illiac dzs, LCF stenosis, 100% LSFA w recon above knee pop and 3 vessel runoff, 100% RSFA w/ recon above knee --> pending L Fem to below Knee pop bypass.  Marland Kitchen History of tobacco abuse   . Hypertension     takes Amlodipine,Metoprolol,and Prinivil daily  . Dyspnea on exertion     with exertion  . GERD (gastroesophageal reflux disease)   . Cataract     bilateral  . Dialysis patient   . PUD (peptic ulcer disease) mid 1990's    with GI bleed.  endoscoped in mid 1990's at [facility]    Past Surgical History  Procedure Laterality Date  . Arteriovenous graft placement    . Av fistula placement  12/10/2000    Right brachiocephalic arteriovenous   . Hernia repair    . Aortagram  07/29/2011    Abdominal Aortagram  . Cardiac catheterization  08/01/11    Left heart catheterization  . Femoral-popliteal bypass graft  09/09/2011    Procedure: BYPASS GRAFT  FEMORAL-POPLITEAL ARTERY;  Surgeon: [name], MD;  Location: [facility] OR;  Service: Vascular;  Laterality: Left;  . Amputation  09/12/2011    Procedure: AMPUTATION BELOW KNEE;  Surgeon: [name], MD;  Location: [facility] OR;  Service: Vascular;  Laterality: Left;  TRANSMETATARSAL  . Application of wound vac  10/30/2011    Procedure: APPLICATION OF WOUND VAC;  Surgeon: [name], MD;  Location: [facility] OR;  Service: Vascular;  Laterality: Left;  . Groin debridement  11/01/2011    Procedure: Drucie Ip DEBRIDEMENT;  Surgeon: Chuck Hint, MD;  Location: [facility] OR;  Service: Vascular;  Laterality: Left;  . Esophagogastroduodenoscopy  11/06/2011    Procedure: ESOPHAGOGASTRODUODENOSCOPY (EGD);  Surgeon: Beverley Fiedler, MD;  Location: [facility] ENDOSCOPY;  Service: Gastroenterology;  Laterality: N/A;  . Femoral-popliteal bypass graft  01/02/2012    Procedure: BYPASS GRAFT FEMORAL-POPLITEAL ARTERY;  Surgeon: [name], MD;  Location: [facility] OR;  Service: Vascular;  Laterality: Right;  . Amputation  01/02/2012    Procedure: AMPUTATION RAY;  Surgeon: [name], MD;  Location: [facility] OR;  Service: Vascular;  Laterality: Right;    History   Social History  . Marital Status: Legally Separated    Spouse Name: N/A    Number of Children: N/A  . Years of Education: N/A  Occupational History  . DISABLED    Social History Main Topics  . Smoking status: Current Every Day Smoker -- 0.30 packs/day for 40 years    Types: Cigarettes  . Smokeless tobacco: Never Used     Comment: pt was given 1-800-QUIT-NOW  . Alcohol Use: No  . Drug Use: No  . Sexually Active: Not Currently   Other Topics Concern  . Not on file   Social History Narrative   Lives in [place].  His cousin helps him out and drives him to appts. He quit smoking earlier this year.    Family History  Problem Relation Age of Onset  . Breast cancer Mother   . Cancer Mother   . Cancer Father   . Anesthesia problems Neg Hx   . Hypotension Neg  Hx   . Malignant hyperthermia Neg Hx   . Pseudochol deficiency Neg Hx     Allergies as of 06/21/2012 - Review Complete 06/21/2012  Allergen Reaction Noted  . Ambien (zolpidem tartrate) Other (See Comments) 01/26/2012    Current Outpatient Prescriptions on File Prior to Visit  Medication Sig Dispense Refill  . ALPRAZolam (XANAX) 1 MG tablet Take 1 tablet by mouth daily.      . cinacalcet (SENSIPAR) 30 MG tablet Take 30 mg by mouth daily.      . hydrALAZINE (APRESOLINE) 25 MG tablet Take 25 mg by mouth 3 (three) times daily.      . isosorbide dinitrate (ISORDIL) 30 MG tablet Take 1 tablet by mouth.      . multivitamin (RENA-VIT) TABS tablet Take 1 tablet by mouth daily.      . simvastatin (ZOCOR) 5 MG tablet Take 5 mg by mouth at bedtime.      Marland Kitchen aspirin 81 MG chewable tablet Chew 1 tablet (81 mg total) by mouth daily.  30 tablet  0  . atorvastatin (LIPITOR) 10 MG tablet Take 10 mg by mouth at bedtime.      . darbepoetin (ARANESP) 100 MCG/0.5ML SOLN Inject 0.5 mLs (100 mcg total) into the vein every Monday with hemodialysis.  4.2 mL  0  . FOSRENOL 1000 MG chewable tablet Chew by mouth daily.      Marland Kitchen gabapentin (NEURONTIN) 100 MG capsule Take 2 capsules (200 mg total) by mouth daily.  60 capsule  1  . HYDROcodone-acetaminophen (NORCO) 10-325 MG per tablet Take 1 tablet by mouth as needed.      . isosorbide mononitrate (IMDUR) 60 MG 24 hr tablet Take 60 mg by mouth daily.      Marland Kitchen oxyCODONE (OXY IR/ROXICODONE) 5 MG immediate release tablet Take 1-2 tablets (5-10 mg total) by mouth every 4 (four) hours as needed.  30 tablet  0  . sevelamer (RENVELA) 800 MG tablet Take 2 tablets (1,600 mg total) by mouth 3 (three) times daily with meals.  180 tablet  0   No current facility-administered medications on file prior to visit.     REVIEW OF SYSTEMS: See history of present illness, all other systems negative  PHYSICAL EXAMINATION:   Vital signs are BP 152/44  Pulse 71  Ht 5\' 6"  (1.676 m)  Wt  154 lb (69.854 kg)  BMI 24.87 kg/m2  SpO2 100% General: The patient appears their stated age. HEENT:  No gross abnormalities Pulmonary:  Non labored breathing Musculoskeletal: There are no major deformities. Neurologic: No focal weakness or paresthesias are detected, Skin: There are no ulcer or rashes noted. Psychiatric: The patient has normal affect. Cardiovascular: There  is a regular rate and rhythm without significant murmur appreciated.   Diagnostic Studies Vein mapping was ordered and reviewed. He does not have an adequate cephalic or basilic vein bilaterally.  Assessment: End-stage renal disease Plan: I for that I can proceed with a graft in the patient's left forearm were potential the right forearm, however I would like to give bilateral upper extremity vein mapping to help make this decision. This is been scheduled for April 9  V. Charlena Cross, M.D. [facility] [place] Office: [phone] Pager:  [phone]

## 2012-06-30 NOTE — Op Note (Signed)
[facility] [place]  Patient name: KHRIZ LIDDY MRN: [id number] DOB: 1950-08-23 Sex: male  06/30/2012 Pre-operative Diagnosis: End-stage renal disease Post-operative diagnosis:  Same Surgeon:  Jorge Ny Procedure Performed:  1.  bilateral upper extremity venogram    Indications:  The patient has had multiple bilateral upper extremity procedures for dialysis. He is now dialyzing through a left-sided catheter. He comes in for bilateral upper extremity venogram to help with access planning.  Procedure:  Bilateral peripheral IVs were placed in the holding area. Contrast injections were performed through these IVs with imaging of the Upper extremity and central venous system.  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 On the left the brachial veins in the upper arm appear to be patent as is the axillary and subclavian vein. The catheter is somewhat obstructing of the central venous system, however I feel that the central venous system is patent.   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 Durene Cal, M.D. [facility] [place] Office: [phone] Pager:  [phone]

## 2012-06-30 NOTE — Interval H&P Note (Signed)
History and Physical Interval Note:  06/30/2012 8:28 AM  [name]  has presented today for surgery, with the diagnosis of End stage renal  The various methods of treatment have been discussed with the patient and family. After consideration of risks, benefits and other options for treatment, the patient has consented to  Procedure(s): VENOGRAM (N/A) as a surgical intervention .  The patient's history has been reviewed, patient examined, no change in status, stable for surgery.  I have reviewed the patient's chart and labs.  Questions were answered to the patient's satisfaction.     Jodette Wik IV, V. WELL[facility]

## 2012-07-01 ENCOUNTER — Other Ambulatory Visit: Payer: Self-pay

## 2012-07-06 ENCOUNTER — Encounter (HOSPITAL_COMMUNITY): Payer: Self-pay

## 2012-07-14 ENCOUNTER — Encounter (HOSPITAL_COMMUNITY): Payer: Self-pay | Admitting: *Deleted

## 2012-07-14 MED ORDER — DEXTROSE 5 % IV SOLN
1.5000 g | INTRAVENOUS | Status: AC
  Administered 2012-07-15: 1.5 g via INTRAVENOUS
  Filled 2012-07-14: qty 1.5

## 2012-07-15 ENCOUNTER — Encounter (HOSPITAL_COMMUNITY): Payer: Self-pay | Admitting: Anesthesiology

## 2012-07-15 ENCOUNTER — Other Ambulatory Visit: Payer: Self-pay | Admitting: *Deleted

## 2012-07-15 ENCOUNTER — Ambulatory Visit (HOSPITAL_COMMUNITY)
Admission: RE | Admit: 2012-07-15 | Discharge: 2012-07-15 | Disposition: A | Discharge status: Home / Self Care | Payer: Medicare Other | Source: Ambulatory Visit | Attending: Surgery | Admitting: Surgery

## 2012-07-15 ENCOUNTER — Encounter (HOSPITAL_COMMUNITY): Payer: Self-pay | Admitting: *Deleted

## 2012-07-15 ENCOUNTER — Ambulatory Visit (HOSPITAL_COMMUNITY): Payer: Medicare Other

## 2012-07-15 ENCOUNTER — Telehealth: Payer: Self-pay | Admitting: Vascular Surgery

## 2012-07-15 ENCOUNTER — Ambulatory Visit (HOSPITAL_COMMUNITY): Payer: Medicare Other | Admitting: Anesthesiology

## 2012-07-15 ENCOUNTER — Encounter (HOSPITAL_COMMUNITY)
Admission: RE | Disposition: A | Discharge status: Home / Self Care | Payer: Self-pay | Source: Ambulatory Visit | Attending: Surgery

## 2012-07-15 DIAGNOSIS — I120 Hypertensive chronic kidney disease with stage 5 chronic kidney disease or end stage renal disease: Secondary | ICD-10-CM | POA: Insufficient documentation

## 2012-07-15 DIAGNOSIS — D649 Anemia, unspecified: Secondary | ICD-10-CM | POA: Insufficient documentation

## 2012-07-15 DIAGNOSIS — Z79899 Other long term (current) drug therapy: Secondary | ICD-10-CM | POA: Insufficient documentation

## 2012-07-15 DIAGNOSIS — M129 Arthropathy, unspecified: Secondary | ICD-10-CM | POA: Insufficient documentation

## 2012-07-15 DIAGNOSIS — Z4931 Encounter for adequacy testing for hemodialysis: Secondary | ICD-10-CM

## 2012-07-15 DIAGNOSIS — N186 End stage renal disease: Secondary | ICD-10-CM | POA: Insufficient documentation

## 2012-07-15 DIAGNOSIS — I739 Peripheral vascular disease, unspecified: Secondary | ICD-10-CM | POA: Insufficient documentation

## 2012-07-15 DIAGNOSIS — Z87891 Personal history of nicotine dependence: Secondary | ICD-10-CM | POA: Insufficient documentation

## 2012-07-15 DIAGNOSIS — Z8711 Personal history of peptic ulcer disease: Secondary | ICD-10-CM | POA: Insufficient documentation

## 2012-07-15 DIAGNOSIS — Z888 Allergy status to other drugs, medicaments and biological substances status: Secondary | ICD-10-CM | POA: Insufficient documentation

## 2012-07-15 DIAGNOSIS — I509 Heart failure, unspecified: Secondary | ICD-10-CM | POA: Insufficient documentation

## 2012-07-15 DIAGNOSIS — K219 Gastro-esophageal reflux disease without esophagitis: Secondary | ICD-10-CM | POA: Insufficient documentation

## 2012-07-15 DIAGNOSIS — E785 Hyperlipidemia, unspecified: Secondary | ICD-10-CM | POA: Insufficient documentation

## 2012-07-15 HISTORY — PX: EXCHANGE OF A DIALYSIS CATHETER: SHX5818

## 2012-07-15 HISTORY — PX: AV FISTULA PLACEMENT: SHX1204

## 2012-07-15 LAB — SURGICAL PCR SCREEN: Staphylococcus aureus: NEGATIVE

## 2012-07-15 LAB — POCT I-STAT 4, (NA,K, GLUC, HGB,HCT)
HCT: 42 % (ref 39.0–52.0)
Hemoglobin: 14.3 g/dL (ref 13.0–17.0)
Sodium: 140 mEq/L (ref 135–145)

## 2012-07-15 SURGERY — EXCHANGE OF A DIALYSIS CATHETER
Anesthesia: Monitor Anesthesia Care | Site: Chest | Wound class: Clean

## 2012-07-15 MED ORDER — PROPOFOL 10 MG/ML IV BOLUS
INTRAVENOUS | Status: DC | PRN
  Administered 2012-07-15: 200 mg via INTRAVENOUS

## 2012-07-15 MED ORDER — MUPIROCIN 2 % EX OINT
TOPICAL_OINTMENT | CUTANEOUS | Status: AC
  Filled 2012-07-15: qty 22

## 2012-07-15 MED ORDER — PROPOFOL INFUSION 10 MG/ML OPTIME
INTRAVENOUS | Status: DC | PRN
  Administered 2012-07-15: 120 ug/kg/min via INTRAVENOUS

## 2012-07-15 MED ORDER — HYDROMORPHONE HCL PF 1 MG/ML IJ SOLN
INTRAMUSCULAR | Status: AC
  Filled 2012-07-15: qty 1

## 2012-07-15 MED ORDER — MUPIROCIN 2 % EX OINT: TOPICAL_OINTMENT | Freq: Once | CUTANEOUS | Status: DC

## 2012-07-15 MED ORDER — 0.9 % SODIUM CHLORIDE (POUR BTL) OPTIME
TOPICAL | Status: DC | PRN
  Administered 2012-07-15: 1000 mL

## 2012-07-15 MED ORDER — BUPIVACAINE HCL (PF) 0.5 % IJ SOLN
INTRAMUSCULAR | Status: DC | PRN
  Administered 2012-07-15: 30 mL

## 2012-07-15 MED ORDER — THROMBIN 20000 UNITS EX KIT
PACK | CUTANEOUS | Status: DC | PRN
  Administered 2012-07-15: 12:00:00 via TOPICAL

## 2012-07-15 MED ORDER — BUPIVACAINE HCL (PF) 0.5 % IJ SOLN
INTRAMUSCULAR | Status: AC
  Filled 2012-07-15: qty 30

## 2012-07-15 MED ORDER — FENTANYL CITRATE 0.05 MG/ML IJ SOLN
INTRAMUSCULAR | Status: DC | PRN
  Administered 2012-07-15 (×3): 50 ug via INTRAVENOUS

## 2012-07-15 MED ORDER — OXYCODONE HCL 5 MG PO TABS: 5.0000 mg | ORAL_TABLET | ORAL | Status: DC | PRN

## 2012-07-15 MED ORDER — SODIUM CHLORIDE 0.9 % IV SOLN: INTRAVENOUS | Status: DC

## 2012-07-15 MED ORDER — PROTAMINE SULFATE 10 MG/ML IV SOLN
INTRAVENOUS | Status: DC | PRN
  Administered 2012-07-15: 15 mg via INTRAVENOUS

## 2012-07-15 MED ORDER — LIDOCAINE-EPINEPHRINE (PF) 1 %-1:200000 IJ SOLN
INTRAMUSCULAR | Status: AC
  Filled 2012-07-15: qty 10

## 2012-07-15 MED ORDER — LIDOCAINE-EPINEPHRINE (PF) 1 %-1:200000 IJ SOLN
INTRAMUSCULAR | Status: DC | PRN
  Administered 2012-07-15: 30 mL

## 2012-07-15 MED ORDER — SODIUM CHLORIDE 0.9 % IR SOLN
Status: DC | PRN
  Administered 2012-07-15: 11:00:00

## 2012-07-15 MED ORDER — MIDAZOLAM HCL 5 MG/5ML IJ SOLN
INTRAMUSCULAR | Status: DC | PRN
  Administered 2012-07-15: 2 mg via INTRAVENOUS

## 2012-07-15 MED ORDER — THROMBIN 20000 UNITS EX SOLR
CUTANEOUS | Status: AC
  Filled 2012-07-15: qty 20000

## 2012-07-15 MED ORDER — HEPARIN SODIUM (PORCINE) 1000 UNIT/ML IJ SOLN
INTRAMUSCULAR | Status: DC | PRN
  Administered 2012-07-15: 3000 [IU] via INTRAVENOUS

## 2012-07-15 MED ORDER — SODIUM CHLORIDE 0.9 % IV SOLN
INTRAVENOUS | Status: DC | PRN
  Administered 2012-07-15 (×2): via INTRAVENOUS

## 2012-07-15 MED ORDER — HEPARIN SODIUM (PORCINE) 1000 UNIT/ML IJ SOLN
INTRAMUSCULAR | Status: AC
  Filled 2012-07-15: qty 1

## 2012-07-15 MED ORDER — HYDROMORPHONE HCL PF 1 MG/ML IJ SOLN
0.2500 mg | INTRAMUSCULAR | Status: DC | PRN
  Administered 2012-07-15: 0.25 mg via INTRAVENOUS
  Administered 2012-07-15 (×2): 0.5 mg via INTRAVENOUS

## 2012-07-15 SURGICAL SUPPLY — 49 items
CANISTER SUCTION 2500CC (MISCELLANEOUS) ×4 IMPLANT
CATH CANNON HEMO 15F 50CM (CATHETERS) ×4 IMPLANT
CATH STRAIGHT 5FR 65CM (CATHETERS) ×4 IMPLANT
CLIP TI MEDIUM 6 (CLIP) ×8 IMPLANT
CLIP TI WIDE RED SMALL 6 (CLIP) ×4 IMPLANT
CLOTH BEACON ORANGE TIMEOUT ST (SAFETY) ×4 IMPLANT
COVER PROBE W GEL 5X96 (DRAPES) ×4 IMPLANT
COVER SURGICAL LIGHT HANDLE (MISCELLANEOUS) ×4 IMPLANT
DERMABOND ADVANCED (GAUZE/BANDAGES/DRESSINGS) ×2
DERMABOND ADVANCED .7 DNX12 (GAUZE/BANDAGES/DRESSINGS) ×6 IMPLANT
DRAPE CHEST BREAST 15X10 FENES (DRAPES) ×4 IMPLANT
ELECT REM PT RETURN 9FT ADLT (ELECTROSURGICAL) ×4
ELECTRODE REM PT RTRN 9FT ADLT (ELECTROSURGICAL) ×3 IMPLANT
GAUZE SPONGE 2X2 8PLY STRL LF (GAUZE/BANDAGES/DRESSINGS) ×3 IMPLANT
GAUZE SPONGE 4X4 16PLY XRAY LF (GAUZE/BANDAGES/DRESSINGS) ×4 IMPLANT
GEL ULTRASOUND 20GR AQUASONIC (MISCELLANEOUS) ×4 IMPLANT
GLOVE BIO SURGEON STRL SZ 6 (GLOVE) ×8 IMPLANT
GLOVE BIO SURGEON STRL SZ7 (GLOVE) ×8 IMPLANT
GLOVE BIOGEL PI IND STRL 6.5 (GLOVE) ×12 IMPLANT
GLOVE BIOGEL PI IND STRL 7.5 (GLOVE) IMPLANT
GLOVE BIOGEL PI INDICATOR 6.5 (GLOVE) ×4
GLOVE BIOGEL PI INDICATOR 7.5 (GLOVE)
GLOVE SURG SS PI 7.5 STRL IVOR (GLOVE) IMPLANT
GOWN PREVENTION PLUS XXLARGE (GOWN DISPOSABLE) ×4 IMPLANT
GOWN STRL NON-REIN LRG LVL3 (GOWN DISPOSABLE) ×32 IMPLANT
HEMOSTAT SNOW SURGICEL 2X4 (HEMOSTASIS) IMPLANT
HEMOSTAT SURGICEL 2X14 (HEMOSTASIS) IMPLANT
KIT BASIN OR (CUSTOM PROCEDURE TRAY) ×4 IMPLANT
KIT ROOM TURNOVER OR (KITS) ×4 IMPLANT
NS IRRIG 1000ML POUR BTL (IV SOLUTION) ×4 IMPLANT
PACK CV ACCESS (CUSTOM PROCEDURE TRAY) ×4 IMPLANT
PAD ARMBOARD 7.5X6 YLW CONV (MISCELLANEOUS) ×8 IMPLANT
SPONGE GAUZE 2X2 STER 10/PKG (GAUZE/BANDAGES/DRESSINGS) ×1
SPONGE SURGIFOAM ABS GEL 100 (HEMOSTASIS) ×4 IMPLANT
SUT ETHILON 3 0 PS 1 (SUTURE) ×4 IMPLANT
SUT MNCRL AB 4-0 PS2 18 (SUTURE) ×8 IMPLANT
SUT PROLENE 6 0 BV (SUTURE) ×8 IMPLANT
SUT PROLENE 6 0 CC (SUTURE) ×4 IMPLANT
SUT VIC AB 3-0 SH 27 (SUTURE) ×2
SUT VIC AB 3-0 SH 27X BRD (SUTURE) ×6 IMPLANT
SUT VICRYL 4-0 PS2 18IN ABS (SUTURE) IMPLANT
SYR 20CC LL (SYRINGE) ×4 IMPLANT
SYR 3ML LL SCALE MARK (SYRINGE) ×4 IMPLANT
SYR 5ML LL (SYRINGE) ×4 IMPLANT
TOWEL OR 17X24 6PK STRL BLUE (TOWEL DISPOSABLE) ×4 IMPLANT
TOWEL OR 17X26 10 PK STRL BLUE (TOWEL DISPOSABLE) ×8 IMPLANT
UNDERPAD 30X30 INCONTINENT (UNDERPADS AND DIAPERS) ×4 IMPLANT
WATER STERILE IRR 1000ML POUR (IV SOLUTION) ×4 IMPLANT
WIRE AMPLATZ SS-J .035X180CM (WIRE) ×4 IMPLANT

## 2012-07-15 NOTE — Anesthesia Procedure Notes (Signed)
Procedure Name: LMA Insertion Date/Time: 07/15/2012 11:09 AM Performed by: Carmela Rima Pre-anesthesia Checklist: Patient identified, Timeout performed, Emergency Drugs available, Suction available and Patient being monitored Patient Re-evaluated:Patient Re-evaluated prior to inductionOxygen Delivery Method: Circle system utilized Preoxygenation: Pre-oxygenation with 100% oxygen Intubation Type: IV induction Ventilation: Mask ventilation without difficulty LMA: LMA inserted LMA Size: 4.0 Tube secured with: Tape Dental Injury: Teeth and Oropharynx as per pre-operative assessment

## 2012-07-15 NOTE — Telephone Encounter (Signed)
Unable to leave message for patient re: appts. Mailed AVS with highlighted dates, dpm

## 2012-07-15 NOTE — Anesthesia Postprocedure Evaluation (Signed)
  Anesthesia Post-op Note  Patient: [name]  Procedure(s) Performed: Procedure(s): EXCHANGE OF A DIALYSIS CATHETER (N/A) ARTERIOVENOUS (AV) FISTULA CREATION (Left)  Patient Location: PACU  Anesthesia Type:General  Level of Consciousness: awake  Airway and Oxygen Therapy: Patient Spontanous Breathing  Post-op Pain: mild  Post-op Assessment: Post-op Vital signs reviewed  Post-op Vital Signs: Reviewed  Complications: No apparent anesthesia complications

## 2012-07-15 NOTE — Op Note (Signed)
OPERATIVE NOTE   PROCEDURE: 1. left first stage brachial vein transposition (brachiobrachial arteriovenous fistula) placement 2. Right femoral tunneled dialysis catheter placement 3. Cannulation of right femoral vein under ultrasound guidance 4. Removal of left internal jugular vein tunneled dialysis catheter   PRE-OPERATIVE DIAGNOSIS: end stage renal disease   POST-OPERATIVE DIAGNOSIS: same as above   SURGEON: Leonides Sake, MD  ANESTHESIA: general  ESTIMATED BLOOD LOSS: 50 cc  FINDING(S): 1. Severely calcified brachial artery 2. Acceptable brachial vein: 3-4 mm in diameter 3. Weak thrill in fistula at end of case 4. Weak left radial signal at end of case 5. Tips of catheter in the right atrium/inferior vena cava  6. No obvious pneumothorax  SPECIMEN(S):  none  INDICATIONS:   [name] is a 62 y.o. male who presents with end stage renal disease.  Based on his prior venogram, he appeared to be candidate for a left brachial vein transposition vs forearm looped arteriovenous graft.  There was also concern with possible outflow compromise by the left internal jugular vein tunneled dialysis catheter.  The patient is scheduled for left first stage brachial vein transposition vs forearm loop arteriovenous graft, removal of left tunneled dialysis catheter, and placement of new tunneled dialysis catheter.  The patient is aware the risks include but are not limited to: bleeding, infection, steal syndrome, nerve damage, ischemic monomelic neuropathy, failure to mature, and need for additional procedures.  The patient is aware of the risks of the procedure and elects to proceed forward.  DESCRIPTION: After full informed written consent was obtained from the patient, the patient was brought back to the operating room and placed supine upon the operating table.  Prior to induction, the patient received IV antibiotics.   After obtaining adequate anesthesia, the patient was then prepped and  draped in the standard fashion for a left arm access procedure.  I turned my attention first to identifying the patient's brachial vein and brachial artery.  Using SonoSite guidance, the location of these vessels were marked out on the skin.   I made an oblique incision at the level of the antecubitum and dissected through the subcutaneous tissue and fascia to gain exposure of the brachial artery.  This was noted to be 3-4 mm in diameter externally but severely calcified.  I essentially could not compress this artery with my forceps.  This was dissected out proximally and distally.  I then dissected out the lateral brachial vein.  This was noted to be 3-4 mm in diameter externally.  I gave the patient 3000 units of Heparin intravenously to obtain some anticoagulation as I suspected the anastomosis would take longer than normal due to the circumferential calcification.  The distal segment of the vein was ligated with a  2-0 silk, and the vein was transected.  The proximal segment was interrogated with serial dilators.  The vein accepted up to a 3.5 mm dilator without any difficulty.  I then instilled the heparinized saline into the vein and clamped it.  At this point, I reset my exposure of the brachial artery and clamped the artery proximally and distally.  I made an arteriotomy with a #11 blade, and then I extended the arteriotomy with a Potts scissor.  I injected heparinized saline proximal and distal to this arteriotomy.  The vein was then sewn to the artery in an end-to-side configuration with a running stitch of 7-0 Prolene.  Prior to completing this anastomosis, I allowed the vein and artery to backbleed.  There was  no evidence of clot from any vessels.  I completed the anastomosis in the usual fashion and then released all vessel loops and clamps.  There was a weakly palpable thrill in the venous outflow, and there was a dopplerable radial signal.  At this point, I irrigated out the surgical wound.  There was  no further active bleeding.  The subcutaneous tissue was reapproximated with a running stitch of 3-0 Vicryl.  The skin was then reapproximated with a running subcuticular stitch of 4-0 Vicryl.  The skin was then cleaned, dried, and reinforced with Dermabond.  The patient tolerated this procedure well.   At this point, we took down the drapes and the bandages over the left tunneled dialysis catheter.  The site was prepped with Betadine.  I tried to bluntly dissect out the tunneled dialysis catheter cuff, but the cuff was too high in the tunnel to easily access.  I made an incision over the cuff and using blunt dissection and electrocautery dissected the cuff free.  I pulled out the catheter and held pressure to the left internal jugular vein for five minutes.  I placed a figure of eight stitch in this cuff incision with 3-0 Nylon to reapproximate the skin.  Sterile bandages were applied to this wound and the exit site.  At this point, I evaluated the right internal jugular vein under Sonosite.  There was no dominant internal jugular vein, rather what appeared to be hypertrophied collaterals.  The patient was reprepped and draped for a femoral tunneled dialysis catheter placement.  Under ultrasound guidance, the right femoral vein vein was cannulated with the 18 gauge needle.  A J-wire was then placed into the inferior vena cava under fluroscopic guidance.  The wire was then secured in place with a clamp to the drapes.  I then made stab incisions at the cannulation and exit site.  I dissected from the exit site to the cannulation site and dilated the subcutaneous tunnel with a plastic dilator.  The wire was then unclamped and I removed the needle.  I tried to dilate the skin tract and venotomy with the small dilator but the scar tissue in the right groin was too much for the dilator and the wire subsequently bent.  I placed an endhole catheter over the wire into the inferior vena cava.  The wire was exchanged for  an Amplatz wire.  The catheter was removed and over this wire, the skin tract and venotomy was dilated serially with dilators.  Finally, the dilator-sheath was placed under fluroscopic guidance into the right iliac vein.  The dilator and wire were removed.  A 55 cm Diatek catheter was placed under fluoroscopic guidance into the right atrium.  The sheath was broken and peeled away while holding the catheter cuff at the level of the skin.  The back end of this catheter was transected, revealing the two lumens of this catheter.  The ports were docked onto these two lumens.  The catheter hub was then screwed into place.  Each port was tested by aspirating and flushing.  No resistance was noted.  Each port was then thoroughly flushed with heparinized saline.  The catheter was secured in placed with two interrupted stitches of 3-0 Nylon tied to the catheter.  The neck incision was closed with a U-stitch of 4-0 Monocryl.  The neck and chest incision were cleaned and sterile bandages applied.  Each port was then loaded with concentrated heparin (1000 Units/mL) at the manufacturer recommended volumes to each  port.  Sterile caps were applied to each port.  On completion fluoroscopy, the tips of the catheter were in the right atrium, and there was no evidence of pneumothorax.  COMPLICATIONS: none  CONDITION: stable  Leonides Sake, MD [facility] [place] Office: [phone] Pager: [phone]  07/15/2012, 1:10 PM

## 2012-07-15 NOTE — Transfer of Care (Signed)
Immediate Anesthesia Transfer of Care Note  Patient: [name]  Procedure(s) Performed: Procedure(s): EXCHANGE OF A DIALYSIS CATHETER (N/A) ARTERIOVENOUS (AV) FISTULA CREATION (Left)  Patient Location: PACU  Anesthesia Type:General  Level of Consciousness: awake, alert  and oriented  Airway & Oxygen Therapy: Patient Spontanous Breathing and Patient connected to face mask oxygen  Post-op Assessment: Report given to PACU RN, Post -op Vital signs reviewed and stable and Patient moving all extremities X 4  Post vital signs: Reviewed and stable  Complications: No apparent anesthesia complications

## 2012-07-15 NOTE — Preoperative (Signed)
Beta Blockers   Reason not to administer Beta Blockers:Not Applicable 

## 2012-07-15 NOTE — OR Nursing (Signed)
Creation of AV fistula start-1104, end 1235.

## 2012-07-15 NOTE — H&P (Signed)
[facility]   Brief History and Physical  History of Present Illness  [name] is a 62 y.o. male who presents with chief complaint: end stage renal disease.  The patient has had previous left arm fistula attempted which have failed.  The patient underwent a recent left arm fistulogram, which demonstrated patent brachial veins, hence the patient was scheduled for a left forearm arteriovenous graft.   Past Medical History  Diagnosis Date  . Hyperlipidemia   . ESRD (end stage renal disease)     a. MWF dialysis in [place] (followed by Dr. Kristian Covey)  . Arthritis   . Claudication   . Stroke 2005  . Anemia   . Chronic diastolic CHF (congestive heart failure) 07/2010    a. 07/2008 Echo EF 50-55%, mild-mod LVH, Gr 1 DD.  Marland Kitchen Peripheral vascular occlusive disease     a. 07/2011 Periph Angio: No signif Ao-illiac dzs, LCF stenosis, 100% LSFA w recon above knee pop and 3 vessel runoff, 100% RSFA w/ recon above knee --> pending L Fem to below Knee pop bypass.  Marland Kitchen History of tobacco abuse   . Hypertension     takes Amlodipine,Metoprolol,and Prinivil daily  . Dyspnea on exertion     with exertion  . GERD (gastroesophageal reflux disease)   . Cataract     bilateral  . Dialysis patient   . PUD (peptic ulcer disease) mid 1990's    with GI bleed.  endoscoped in mid 1990's at [facility]    Past Surgical History  Procedure Laterality Date  . Arteriovenous graft placement    . Av fistula placement  12/10/2000    Right brachiocephalic arteriovenous   . Hernia repair    . Aortagram  07/29/2011    Abdominal Aortagram  . Cardiac catheterization  08/01/11    Left heart catheterization  . Femoral-popliteal bypass graft  09/09/2011    Procedure: BYPASS GRAFT FEMORAL-POPLITEAL ARTERY;  Surgeon: [name], MD;  Location: [facility] OR;  Service: Vascular;  Laterality: Left;  . Amputation  09/12/2011    Procedure: AMPUTATION BELOW KNEE;  Surgeon: [name], MD;   Location: [facility] OR;  Service: Vascular;  Laterality: Left;  TRANSMETATARSAL  . Application of wound vac  10/30/2011    Procedure: APPLICATION OF WOUND VAC;  Surgeon: [name], MD;  Location: [facility] OR;  Service: Vascular;  Laterality: Left;  . Groin debridement  11/01/2011    Procedure: Drucie Ip DEBRIDEMENT;  Surgeon: Chuck Hint, MD;  Location: [facility] OR;  Service: Vascular;  Laterality: Left;  . Esophagogastroduodenoscopy  11/06/2011    Procedure: ESOPHAGOGASTRODUODENOSCOPY (EGD);  Surgeon: Beverley Fiedler, MD;  Location: [facility] ENDOSCOPY;  Service: Gastroenterology;  Laterality: N/A;  . Femoral-popliteal bypass graft  01/02/2012    Procedure: BYPASS GRAFT FEMORAL-POPLITEAL ARTERY;  Surgeon: [name], MD;  Location: [facility] OR;  Service: Vascular;  Laterality: Right;  . Amputation  01/02/2012    Procedure: AMPUTATION RAY;  Surgeon: [name], MD;  Location: [facility] OR;  Service: Vascular;  Laterality: Right;  . Toe amputation Right     5/13    History   Social History  . Marital Status: Legally Separated    Spouse Name: N/A    Number of Children: N/A  . Years of Education: N/A   Occupational History  . DISABLED    Social History Main Topics  . Smoking status: Former Smoker -- 0.30 packs/day for 40 years    Types: Cigarettes  Quit date: 06/13/2012  . Smokeless tobacco: Never Used     Comment: pt was given 1-800-QUIT-NOW  . Alcohol Use: No  . Drug Use: No  . Sexually Active: Not Currently   Other Topics Concern  . Not on file   Social History Narrative   Lives in [place].  His cousin helps him out and drives him to appts. He quit smoking earlier this year.    Family History  Problem Relation Age of Onset  . Breast cancer Mother   . Cancer Mother   . Cancer Father   . Anesthesia problems Neg Hx   . Hypotension Neg Hx   . Malignant hyperthermia Neg Hx   . Pseudochol deficiency Neg Hx     No current facility-administered medications on file prior to encounter.    Current Outpatient Prescriptions on File Prior to Encounter  Medication Sig Dispense Refill  . ALPRAZolam (XANAX) 1 MG tablet Take 1 tablet by mouth daily.      . calcium acetate (PHOSLO) 667 MG capsule Take 1,334-2,001 mg by mouth 3 (three) times daily with meals.       . cinacalcet (SENSIPAR) 60 MG tablet Take 60 mg by mouth daily.      Marland Kitchen gabapentin (NEURONTIN) 100 MG capsule Take 100 mg by mouth daily.      . hydrALAZINE (APRESOLINE) 25 MG tablet Take 25 mg by mouth 3 (three) times daily.      . isosorbide mononitrate (IMDUR) 30 MG 24 hr tablet Take 30 mg by mouth daily.      . multivitamin (RENA-VIT) TABS tablet Take 1 tablet by mouth daily.      Marland Kitchen oxyCODONE-acetaminophen (PERCOCET/ROXICET) 5-325 MG per tablet Take 1 tablet by mouth every 6 (six) hours as needed for pain.  30 tablet  0  . sevelamer (RENVELA) 800 MG tablet Take 2 tablets (1,600 mg total) by mouth 3 (three) times daily with meals.  180 tablet  0  . simvastatin (ZOCOR) 5 MG tablet Take 5 mg by mouth at bedtime.        Allergies  Allergen Reactions  . Ambien (Zolpidem Tartrate) Other (See Comments)    Hallucinations    Review of Systems: As listed above, otherwise negative.  Physical Examination  Filed Vitals:   07/14/12 1643 07/15/12 0914 07/15/12 0915  BP:   162/92  Pulse:   65  Temp:   97.9 F (36.6 C)  TempSrc:  Oral   Resp:   18  Height: 5\' 6"  (1.676 m)    Weight: 140 lb (63.504 kg)    SpO2:   100%    General: A&O x 3, WDWN  Pulmonary: Sym exp, good air movt, CTAB, no rales, rhonchi, & wheezing, RIJ TDC in place  Cardiac: RRR, Nl S1, S2, no Murmurs, rubs or gallops  Gastrointestinal: soft, NTND, -G/R, - HSM, - masses, - CVAT B  Musculoskeletal: M/S 5/5 throughout , well healed amputation  Laboratory See iStat  Medical Decision Making  [name] is a 62 y.o. male who presents with: ESRD.   The patient is scheduled for: left arm AVG vs fistula, removal of TDC, and placement of new  TDC.  I discussed with the patient proceeding with a stage brachial vein transposition vs forearm loop AVG placement.  The patient is aware a staged brachial vein transposition requires two operations.  I discussed moving the tunneled dialysis catheter to a different location due to possible obturation of the venous flow of the left  arm with the current tunneled dialysis catheter.  Risk, benefits, and alternatives to access surgery were discussed.  The patient is aware the risks include but are not limited to: bleeding, infection, steal syndrome, nerve damage, ischemic monomelic neuropathy, failure to mature, and need for additional procedures. The patient is aware the risks of tunneled dialysis catheter placement include but are not limited to: bleeding, infection, central venous injury, pneumothorax, possible venous stenosis, possible malpositioning in the venous system, and possible infections related to long-term catheter presence.  The patient is aware of the risks and agrees to proceed.  Leonides Sake, MD [facility] [place] Office: [phone] Pager: [phone]  07/15/2012, 10:26 AM

## 2012-07-15 NOTE — Telephone Encounter (Signed)
Message copied by Fredrich Birks on Thu Jul 15, 2012  3:16 PM ------      Message from: Melene Plan      Created: Thu Jul 15, 2012  2:53 PM                   ----- Message -----         From: Fransisco Hertz, MD         Sent: 07/15/2012   1:30 PM           To: Melene Plan, RN            SOUL HACKMAN      [id number]      14-Oct-1950            Follow up clarifications:      1) 2 weeks follow-up for stitch removal in left chest      2) 4-6 weeks follow-up for access check ------

## 2012-07-15 NOTE — Anesthesia Preprocedure Evaluation (Addendum)
Anesthesia Evaluation  Patient identified by MRN, date of birth, ID band Patient awake    Reviewed: Allergy & Precautions, H&P , NPO status , Patient's Chart, lab work & pertinent test results  Airway Mallampati: I TM Distance: >3 FB Neck ROM: full    Dental  (+) Poor Dentition and Dental Advidsory Given   Pulmonary shortness of breath and with exertion, pneumonia -,  breath sounds clear to auscultation        Cardiovascular hypertension, On Medications + CAD, + Peripheral Vascular Disease and +CHF + dysrhythmias Atrial Fibrillation Rhythm:Regular Rate:Normal     Neuro/Psych CVA    GI/Hepatic PUD, GERD-  Poorly Controlled,  Endo/Other  negative endocrine ROS  Renal/GU ESRF and DialysisRenal disease     Musculoskeletal   Abdominal   Peds  Hematology  (+) anemia ,   Anesthesia Other Findings   Reproductive/Obstetrics                          Anesthesia Physical Anesthesia Plan  ASA: III  Anesthesia Plan: MAC   Post-op Pain Management:    Induction: Intravenous  Airway Management Planned:   Additional Equipment:   Intra-op Plan:   Post-operative Plan:   Informed Consent: I have reviewed the patients History and Physical, chart, labs and discussed the procedure including the risks, benefits and alternatives for the proposed anesthesia with the patient or authorized representative who has indicated his/her understanding and acceptance.   Dental Advisory Given and Dental advisory given  Plan Discussed with: Anesthesiologist, CRNA and Surgeon  Anesthesia Plan Comments:        Anesthesia Quick Evaluation

## 2012-07-16 ENCOUNTER — Encounter (HOSPITAL_COMMUNITY): Payer: Self-pay | Admitting: Vascular Surgery

## 2012-07-16 MED FILL — Mupirocin Oint 2%: CUTANEOUS | Qty: 22 | Status: AC

## 2012-07-16 NOTE — Progress Notes (Signed)
Patient reports that he is having 10/10 pain that the pain medication is helping very little. Requested that patient contact Dr. Nicky Pugh office regarding same. Phone number given.

## 2012-07-27 ENCOUNTER — Encounter: Payer: Self-pay | Admitting: Vascular Surgery

## 2012-07-28 ENCOUNTER — Encounter: Payer: Self-pay | Admitting: Vascular Surgery

## 2012-07-28 ENCOUNTER — Ambulatory Visit (INDEPENDENT_AMBULATORY_CARE_PROVIDER_SITE_OTHER): Payer: Medicare Other | Admitting: Vascular Surgery

## 2012-07-28 VITALS — BP 123/62 | HR 76 | Resp 16 | Ht 66.0 in | Wt 149.0 lb

## 2012-07-28 DIAGNOSIS — N186 End stage renal disease: Secondary | ICD-10-CM

## 2012-07-28 MED ORDER — TRAMADOL HCL 50 MG PO TABS: 50.0000 mg | ORAL_TABLET | Freq: Four times a day (QID) | ORAL | Status: DC | PRN

## 2012-07-28 NOTE — Progress Notes (Signed)
[facility] [place]  Postoperative Access Visit  History of Present Illness  [name] is a 62 y.o. year old male who presents for postoperative follow-up for: L first stage brachial vein transposition, L TDC removal, R fem TDC (Date: 07/15/12).  The patient's wounds are healed with "bump at incision".  The patient notes no steal symptoms.  The patient is able to complete their activities of daily living.  The patient's current symptoms are: chronic pain in legs.  Pt has follow with Dr. Myra Gianotti who previously did his bypasses in his legs..  Physical Examination  Filed Vitals:   07/28/12 1554  BP: 123/62  Pulse: 76  Resp: 16   L chest: incision healed, stitch removed  LUE: Incision is healed, ballotable area medial to incision, skin feels warm, hand grip is 5/5, sensation in digits is intact, faintly palpable thrill, bruit can be auscultated   Medical Decision Making  [name] is a 62 y.o. year old male who presents s/p L 1st staged BRVT.  The patient will follow up in in 6 weeks for re-evaluation of the access.  I expect the likely seroma to improve, or resolve before that time.  If not, it will be drained as part of the second stage.  I gave the patient #30 Ultram 1 po qid prn pain for his chronic leg pain.  He already has follow up with Dr. Myra Gianotti for further work-up.  Thank you for allowing [place] to participate in this patient's care.  Leonides Sake, MD [facility] [place] Office: [phone] Pager: [phone]

## 2012-08-05 ENCOUNTER — Telehealth: Payer: Self-pay

## 2012-08-05 ENCOUNTER — Ambulatory Visit (INDEPENDENT_AMBULATORY_CARE_PROVIDER_SITE_OTHER): Payer: Medicare Other | Admitting: Vascular Surgery

## 2012-08-05 ENCOUNTER — Encounter: Payer: Self-pay | Admitting: Vascular Surgery

## 2012-08-05 VITALS — BP 147/66 | HR 78 | Resp 16 | Ht 66.0 in | Wt 153.0 lb

## 2012-08-05 DIAGNOSIS — N186 End stage renal disease: Secondary | ICD-10-CM

## 2012-08-05 DIAGNOSIS — M79609 Pain in unspecified limb: Secondary | ICD-10-CM

## 2012-08-05 MED ORDER — OXYCODONE-ACETAMINOPHEN 5-325 MG PO TABS: 1.0000 | ORAL_TABLET | ORAL | Status: DC | PRN

## 2012-08-05 NOTE — Telephone Encounter (Signed)
Pt's. Caregiver called and reported that pt. Has had intermittent bleeding from the incision left arm access site.  States the incision is partially opened up, and that there was a lot of blood on his clothing and bed sheets 2 days ago.  States it bled again this morning from the incision.  States the bleeding has stopped at this time.  Unable to come to office tomorrow when offered appt.; states has dialysis tomorrow, and is gone from 10:00-4:00pm.  Discussed with Dr. Darrick Penna.  Recommended pt. Come to office today for incision check.  Notified caregiver; pt. Will come to office this afternoon.

## 2012-08-05 NOTE — Progress Notes (Signed)
Patient is a 62 year old male who underwent first stage brachial vein transposition fistula by Dr. Imogene Burn on April 24. He apparently saw Dr. Imogene Burn recently and was found to have a seroma near the antecubital area. He had some bleeding from his left arm incision earlier this week and wanted to have this evaluated today. He denies any fever or chills. He has had no problems on dialysis.  Physical exam:  Filed Vitals:   08/05/12 1413  BP: 147/66  Pulse: 78  Resp: 16  Height: 5\' 6"  (1.676 m)  Weight: 153 lb (69.4 kg)  SpO2: 98%   left upper extremity: 4 x 4 centimeter mass adjacent to the antecubital incision. There is a 1 mm opening in the central portion incision which I was able to express serosanguineous fluid from, the mass is nonpulsatile. There is a palpable thrill above the mass. There is no surrounding erythema.  Assessment: Left upper extremity either small hematoma or seroma but no obvious active infection and no evidence that this is a pseudoaneurysm Plan: Continue dry dressings intermittently if there is still some drainage from the wound. The patient will keep his scheduled followup with Dr. Imogene Burn on May 20. The patient also has had bilateral femoropopliteal bypasses by Dr. Myra Gianotti in the past. He was scheduled for graft surveillance in the near future as well.  Fabienne Bruns, MD [facility] [place] Office: [phone] Pager: [phone]

## 2012-08-06 ENCOUNTER — Emergency Department (HOSPITAL_COMMUNITY): Payer: Medicare Other

## 2012-08-06 ENCOUNTER — Encounter (HOSPITAL_COMMUNITY): Payer: Self-pay

## 2012-08-06 ENCOUNTER — Emergency Department (HOSPITAL_COMMUNITY)
Admission: EM | Admit: 2012-08-06 | Discharge: 2012-08-06 | Disposition: A | Discharge status: Home / Self Care | Payer: Medicare Other | Attending: Emergency Medicine | Admitting: Emergency Medicine

## 2012-08-06 DIAGNOSIS — Z79899 Other long term (current) drug therapy: Secondary | ICD-10-CM | POA: Insufficient documentation

## 2012-08-06 DIAGNOSIS — Z992 Dependence on renal dialysis: Secondary | ICD-10-CM | POA: Insufficient documentation

## 2012-08-06 DIAGNOSIS — Z8673 Personal history of transient ischemic attack (TIA), and cerebral infarction without residual deficits: Secondary | ICD-10-CM | POA: Insufficient documentation

## 2012-08-06 DIAGNOSIS — J159 Unspecified bacterial pneumonia: Secondary | ICD-10-CM | POA: Insufficient documentation

## 2012-08-06 DIAGNOSIS — E785 Hyperlipidemia, unspecified: Secondary | ICD-10-CM | POA: Insufficient documentation

## 2012-08-06 DIAGNOSIS — I739 Peripheral vascular disease, unspecified: Secondary | ICD-10-CM | POA: Insufficient documentation

## 2012-08-06 DIAGNOSIS — I10 Essential (primary) hypertension: Secondary | ICD-10-CM | POA: Insufficient documentation

## 2012-08-06 DIAGNOSIS — I120 Hypertensive chronic kidney disease with stage 5 chronic kidney disease or end stage renal disease: Secondary | ICD-10-CM | POA: Insufficient documentation

## 2012-08-06 DIAGNOSIS — Z8739 Personal history of other diseases of the musculoskeletal system and connective tissue: Secondary | ICD-10-CM | POA: Insufficient documentation

## 2012-08-06 DIAGNOSIS — Z862 Personal history of diseases of the blood and blood-forming organs and certain disorders involving the immune mechanism: Secondary | ICD-10-CM | POA: Insufficient documentation

## 2012-08-06 DIAGNOSIS — I5032 Chronic diastolic (congestive) heart failure: Secondary | ICD-10-CM | POA: Insufficient documentation

## 2012-08-06 DIAGNOSIS — Z87891 Personal history of nicotine dependence: Secondary | ICD-10-CM | POA: Insufficient documentation

## 2012-08-06 DIAGNOSIS — K219 Gastro-esophageal reflux disease without esophagitis: Secondary | ICD-10-CM | POA: Insufficient documentation

## 2012-08-06 DIAGNOSIS — R1110 Vomiting, unspecified: Secondary | ICD-10-CM | POA: Insufficient documentation

## 2012-08-06 DIAGNOSIS — J189 Pneumonia, unspecified organism: Secondary | ICD-10-CM

## 2012-08-06 DIAGNOSIS — R55 Syncope and collapse: Secondary | ICD-10-CM | POA: Insufficient documentation

## 2012-08-06 DIAGNOSIS — N186 End stage renal disease: Secondary | ICD-10-CM | POA: Insufficient documentation

## 2012-08-06 DIAGNOSIS — Z8711 Personal history of peptic ulcer disease: Secondary | ICD-10-CM | POA: Insufficient documentation

## 2012-08-06 LAB — CBC WITH DIFFERENTIAL/PLATELET
Basophils Absolute: 0.1 10*3/uL (ref 0.0–0.1)
Basophils Relative: 1 % (ref 0–1)
HCT: 39.5 % (ref 39.0–52.0)
Hemoglobin: 12 g/dL — ABNORMAL LOW (ref 13.0–17.0)
Lymphocytes Relative: 26 % (ref 12–46)
Monocytes Absolute: 0.4 10*3/uL (ref 0.1–1.0)
Monocytes Relative: 8 % (ref 3–12)
Neutro Abs: 2.8 10*3/uL (ref 1.7–7.7)
Neutrophils Relative %: 54 % (ref 43–77)
WBC: 5.2 10*3/uL (ref 4.0–10.5)

## 2012-08-06 LAB — COMPREHENSIVE METABOLIC PANEL
ALT: 18 U/L (ref 0–53)
Albumin: 3.4 g/dL — ABNORMAL LOW (ref 3.5–5.2)
Alkaline Phosphatase: 146 U/L — ABNORMAL HIGH (ref 39–117)
BUN: 60 mg/dL — ABNORMAL HIGH (ref 6–23)
Chloride: 97 mEq/L (ref 96–112)
Glucose, Bld: 75 mg/dL (ref 70–99)
Potassium: 4.6 mEq/L (ref 3.5–5.1)
Sodium: 139 mEq/L (ref 135–145)
Total Bilirubin: 0.3 mg/dL (ref 0.3–1.2)
Total Protein: 8.7 g/dL — ABNORMAL HIGH (ref 6.0–8.3)

## 2012-08-06 LAB — TROPONIN I: Troponin I: <0.3 ng/mL (ref <0.30)

## 2012-08-06 MED ORDER — OXYCODONE-ACETAMINOPHEN 5-325 MG PO TABS
1.0000 | ORAL_TABLET | Freq: Once | ORAL | Status: AC
  Administered 2012-08-06: 1 via ORAL
  Filled 2012-08-06: qty 1

## 2012-08-06 MED ORDER — LEVOFLOXACIN 500 MG PO TABS
500.0000 mg | ORAL_TABLET | Freq: Once | ORAL | Status: AC
  Administered 2012-08-06: 500 mg via ORAL
  Filled 2012-08-06: qty 1

## 2012-08-06 MED ORDER — LEVOFLOXACIN 500 MG PO TABS: 500.0000 mg | ORAL_TABLET | Freq: Every day | ORAL | Status: DC

## 2012-08-06 MED ORDER — HYDROCODONE-ACETAMINOPHEN 5-325 MG PO TABS
1.0000 | ORAL_TABLET | Freq: Once | ORAL | Status: AC
  Administered 2012-08-06: 1 via ORAL
  Filled 2012-08-06: qty 1

## 2012-08-06 MED ORDER — LEVOFLOXACIN IN D5W 750 MG/150ML IV SOLN
750.0000 mg | Freq: Once | INTRAVENOUS | Status: AC
  Administered 2012-08-06: 750 mg via INTRAVENOUS
  Filled 2012-08-06 (×2): qty 150

## 2012-08-06 MED ORDER — IOHEXOL 350 MG/ML SOLN
100.0000 mL | Freq: Once | INTRAVENOUS | Status: AC | PRN
  Administered 2012-08-06: 100 mL via INTRAVENOUS

## 2012-08-06 NOTE — ED Notes (Signed)
Pt states he was at dialysis and had a sudden onset of chest pain and syncopal episode. Pt denies pain at present.

## 2012-08-06 NOTE — ED Provider Notes (Signed)
History  This chart was scribed for [name], MD by Bennett Scrape, ED Scribe. This patient was seen in room APAH2/APAH2 and the patient's care was started at 3:24 PM.  CSN: [id number]  Arrival date & time 08/06/12  1254   First MD Initiated Contact with Patient 08/06/12 1507      Chief Complaint  Patient presents with  . Chest Pain    The history is provided by the patient. No language interpreter was used.   HPI Comments: [name] is a 62 y.o. male who is a hemodialysis pt presents to the Emergency Department complaining of sudden onset, now resolved diffuse CP with one episode of emesis and syncope while getting dialysis today. Pt states that he was hooked up to the machine when he felt CP and vomited once with syncope coming after. Pt states that the dialysis clinic "pulled too much fluid off too quick" although he denies any prior episodes. He denies having any pain currently and states that he has been taking his daily medications as prescribed. He denies fever, lightheadedness and abdominal pain as associated symptoms. Pt is a former smoker but denies alcohol use.   Past Medical History  Diagnosis Date  . Hyperlipidemia   . ESRD (end stage renal disease)     a. MWF dialysis in [place] (followed by Dr. Kristian Covey)  . Arthritis   . Claudication   . Stroke 2005  . Anemia   . Chronic diastolic CHF (congestive heart failure) 07/2010    a. 07/2008 Echo EF 50-55%, mild-mod LVH, Gr 1 DD.  Marland Kitchen Peripheral vascular occlusive disease     a. 07/2011 Periph Angio: No signif Ao-illiac dzs, LCF stenosis, 100% LSFA w recon above knee pop and 3 vessel runoff, 100% RSFA w/ recon above knee --> pending L Fem to below Knee pop bypass.  Marland Kitchen History of tobacco abuse   . Hypertension     takes Amlodipine,Metoprolol,and Prinivil daily  . Dyspnea on exertion     with exertion  . GERD (gastroesophageal reflux disease)   . Cataract     bilateral  . Dialysis patient   . PUD (peptic ulcer  disease) mid 1990's    with GI bleed.  endoscoped in mid 1990's at [facility]    Past Surgical History  Procedure Laterality Date  . Arteriovenous graft placement    . Av fistula placement  12/10/2000    Right brachiocephalic arteriovenous   . Hernia repair    . Aortagram  07/29/2011    Abdominal Aortagram  . Cardiac catheterization  08/01/11    Left heart catheterization  . Femoral-popliteal bypass graft  09/09/2011    Procedure: BYPASS GRAFT FEMORAL-POPLITEAL ARTERY;  Surgeon: [name], MD;  Location: [facility] OR;  Service: Vascular;  Laterality: Left;  . Amputation  09/12/2011    Procedure: AMPUTATION BELOW KNEE;  Surgeon: [name], MD;  Location: [facility] OR;  Service: Vascular;  Laterality: Left;  TRANSMETATARSAL  . Application of wound vac  10/30/2011    Procedure: APPLICATION OF WOUND VAC;  Surgeon: [name], MD;  Location: [facility] OR;  Service: Vascular;  Laterality: Left;  . Groin debridement  11/01/2011    Procedure: Drucie Ip DEBRIDEMENT;  Surgeon: Chuck Hint, MD;  Location: [facility] OR;  Service: Vascular;  Laterality: Left;  . Esophagogastroduodenoscopy  11/06/2011    Procedure: ESOPHAGOGASTRODUODENOSCOPY (EGD);  Surgeon: Beverley Fiedler, MD;  Location: [facility] ENDOSCOPY;  Service: Gastroenterology;  Laterality: N/A;  . Femoral-popliteal bypass  graft  01/02/2012    Procedure: BYPASS GRAFT FEMORAL-POPLITEAL ARTERY;  Surgeon: [name], MD;  Location: [facility] OR;  Service: Vascular;  Laterality: Right;  . Amputation  01/02/2012    Procedure: AMPUTATION RAY;  Surgeon: [name], MD;  Location: [facility] OR;  Service: Vascular;  Laterality: Right;  . Toe amputation Right     5/13  . Exchange of a dialysis catheter N/A 07/15/2012    Procedure: EXCHANGE OF A DIALYSIS CATHETER;  Surgeon: Fransisco Hertz, MD;  Location: [facility] OR;  Service: Vascular;  Laterality: N/A;  . Av fistula placement Left 07/15/2012    Procedure: ARTERIOVENOUS (AV) FISTULA CREATION;  Surgeon: Fransisco Hertz, MD;   Location: [facility] OR;  Service: Vascular;  Laterality: Left;    Family History  Problem Relation Age of Onset  . Breast cancer Mother   . Cancer Mother   . Cancer Father   . Anesthesia problems Neg Hx   . Hypotension Neg Hx   . Malignant hyperthermia Neg Hx   . Pseudochol deficiency Neg Hx     History  Substance Use Topics  . Smoking status: Former Smoker -- 0.30 packs/day for 40 years    Types: Cigarettes    Quit date: 06/13/2012  . Smokeless tobacco: Never Used     Comment: pt was given 1-800-QUIT-NOW  . Alcohol Use: No      Review of Systems  Constitutional: Negative for fever.  Cardiovascular: Positive for chest pain.  Gastrointestinal: Positive for vomiting. Negative for abdominal pain.  Neurological: Positive for syncope. Negative for weakness.  All other systems reviewed and are negative.    Allergies  Ambien  Home Medications   Current Outpatient Rx  Name  Route  Sig  Dispense  Refill  . ALPRAZolam (XANAX) 1 MG tablet   Oral   Take 1 tablet by mouth daily.         . calcium acetate (PHOSLO) 667 MG capsule   Oral   Take 1,334-2,001 mg by mouth 3 (three) times daily with meals.          . cinacalcet (SENSIPAR) 60 MG tablet   Oral   Take 60 mg by mouth daily.         Marland Kitchen FOSRENOL 1000 MG chewable tablet               . gabapentin (NEURONTIN) 100 MG capsule   Oral   Take 100 mg by mouth daily.         . hydrALAZINE (APRESOLINE) 25 MG tablet   Oral   Take 25 mg by mouth 3 (three) times daily.         . isosorbide mononitrate (IMDUR) 30 MG 24 hr tablet   Oral   Take 30 mg by mouth daily.         . multivitamin (RENA-VIT) TABS tablet   Oral   Take 1 tablet by mouth daily.         Marland Kitchen oxyCODONE (ROXICODONE) 5 MG immediate release tablet   Oral   Take 1 tablet (5 mg total) by mouth every 4 (four) hours as needed for pain.   30 tablet   0   . oxyCODONE-acetaminophen (PERCOCET/ROXICET) 5-325 MG per tablet   Oral   Take 1 tablet by  mouth every 4 (four) hours as needed for pain.   10 tablet   0   . sevelamer (RENVELA) 800 MG tablet   Oral   Take 2 tablets (1,600 mg  total) by mouth 3 (three) times daily with meals.   180 tablet   0   . simvastatin (ZOCOR) 5 MG tablet   Oral   Take 5 mg by mouth at bedtime.         . traMADol (ULTRAM) 50 MG tablet   Oral   Take 1 tablet (50 mg total) by mouth every 6 (six) hours as needed for pain.   30 tablet   0   . levofloxacin (LEVAQUIN) 500 MG tablet   Oral   Take 1 tablet (500 mg total) by mouth daily.   9 tablet   0     BP 159/105  Pulse 71  Resp 18  Ht 5\' 6"  (1.676 m)  Wt 154 lb (69.854 kg)  BMI 24.87 kg/m2  SpO2 97%  Physical Exam  Nursing note and vitals reviewed. Constitutional: He is oriented to person, place, and time. He appears well-developed and well-nourished.  HENT:  Head: Normocephalic and atraumatic.  Right Ear: External ear normal.  Left Ear: External ear normal.  Eyes: Conjunctivae and EOM are normal. Pupils are equal, round, and reactive to light.  Neck: Normal range of motion and phonation normal. Neck supple.  Cardiovascular: Normal rate, regular rhythm, normal heart sounds and intact distal pulses.   Pulmonary/Chest: Effort normal. He has wheezes (in the right base). He exhibits no bony tenderness.  Scattered rhonchi  Abdominal: Soft. Normal appearance. There is no tenderness.  Musculoskeletal: Normal range of motion.  Neurological: He is alert and oriented to person, place, and time. He has normal strength. No cranial nerve deficit or sensory deficit. He exhibits normal muscle tone. Coordination normal.  Skin: Skin is warm, dry and intact.  Psychiatric: He has a normal mood and affect. His behavior is normal. Judgment and thought content normal.    ED Course  Procedures (including critical care time)  Patient Vitals for the past 24 hrs:  BP Pulse Resp SpO2 Height Weight  08/06/12 1845 - 71 18 97 % - -  08/06/12 1842 - 49 15  97 % - -  08/06/12 1841 159/105 mmHg - 13 - - -  08/06/12 1601 153/74 mmHg 66 - 100 % - -  08/06/12 1600 142/93 mmHg 99 16 85 % - -  08/06/12 1303 101/71 mmHg 71 16 97 % - -  08/06/12 1259 - - - - 5\' 6"  (1.676 m) 154 lb (69.854 kg)   Medications  levofloxacin (LEVAQUIN) IVPB 750 mg (not administered)  HYDROcodone-acetaminophen (NORCO/VICODIN) 5-325 MG per tablet 1 tablet (1 tablet Oral Given 08/06/12 1652)  iohexol (OMNIPAQUE) 350 MG/ML injection 100 mL (100 mLs Intravenous Contrast Given 08/06/12 1803)       DIAGNOSTIC STUDIES: None performed.    COORDINATION OF CARE: 3:26 PM-Informed pt of CXR results. Discussed treatment plan which includes CT angio of chest with pt at bedside and pt agreed to plan.   Nursing notes, applicable records and vitals reviewed.  7:30 PM Reevaluation with update and discussion. After initial assessment and treatment, an updated evaluation reveals he remains comfortable, is currently eating and has no complaints. Courtenay Hirth L   7:20 PM-Consult complete with Dr. Derrek Gu, Nephrology. Patient case explained and discussed. He agrees to and he see patient for further evaluation and treatment, at Dialysis. Call ended at and 19:25    Labs Reviewed  CBC WITH DIFFERENTIAL - Abnormal; Notable for the following:    Hemoglobin 12.0 (*)    RDW 21.4 (*)    Eosinophils Relative 11 (*)  All other components within normal limits  COMPREHENSIVE METABOLIC PANEL - Abnormal; Notable for the following:    BUN 60 (*)    Creatinine, Ser 9.21 (*)    Total Protein 8.7 (*)    Albumin 3.4 (*)    Alkaline Phosphatase 146 (*)    GFR calc non Af Amer 5 (*)    GFR calc Af Amer 6 (*)    All other components within normal limits  TROPONIN I   Dg Chest 2 View  08/06/2012   *RADIOLOGY REPORT*  Clinical Data: Chest pain and syncope.  Hypertension.  CHEST - 2 VIEW  Comparison: 01/02/2012  Findings: Interval removal of left-sided dialysis catheter with placement of an  inferiorly placed dialysis catheter with tips at high right atrium versus low SVC and mid right atrium.  Midline trachea.  Mild cardiomegaly with atherosclerosis in the transverse aorta.  Increase in size of a moderate right-sided pleural effusion.  No pneumothorax.  No left-sided pleural effusion.  Worsened right lower lung aeration.  Minimal scarring or atelectasis at the left lung base.  IMPRESSION: Increased size of a moderate right pleural effusion with adjacent airspace disease.  Atherosclerosis.   Original Report Authenticated By: Jeronimo Greaves, M.D.     1. CAP (community acquired pneumonia)       MDM:  syncope during dialysis with pneumonia on chest x-ray. He is asymptomatic for the pneumonia. He is improved with treatment in the emergency department. I have consulted with his nephrologist, who is willing to see him as an outpatient and recommends daily, Levaquin, until seen at dialysis.   Doubt metabolic instability, serious bacterial infection or impending vascular collapse; the patient is stable for discharge.   Nursing Notes Reviewed/ Care Coordinated, and agree without changes. Applicable Imaging Reviewed. Radiologic imaging report reviewed and images by Radiography and CT  - viewed, by me. Interpretation of Laboratory Data incorporated into ED treatment   Plan: Home Medications- Levaquin; Home Treatments- rest; Recommended follow up- PCP and Renal F/U next week.    I personally performed the services described in this documentation, which was scribed in my presence. The recorded information has been reviewed and is accurate.     [name], MD 08/06/12 [phone]

## 2012-08-06 NOTE — ED Notes (Signed)
Pt requested iv antibiotics stopped so he could leave. EDP states ok to stop. Stopped after 30ml had infused.

## 2012-08-06 NOTE — ED Notes (Signed)
Pt requesting pain meds for phantom left foot pain.  edp notified and orders received.

## 2012-08-06 NOTE — ED Notes (Signed)
Pt sitting up in bed eating dinner at this time, currently pt denies chest pain and denies any other complaints. Pt is AOx4 at current.

## 2012-08-06 NOTE — ED Notes (Signed)
Pt was given 500 cc ns at dialysis. Pt hs shunt in right leg

## 2012-08-07 NOTE — ED Notes (Signed)
Pt's caregiver, Carmelina Paddock called to report pt was not able to get the Levaquin 500mg  filled as per discharge rx given d/t needs pre-approval by Medicare.  Called medicare at number provided by caregiver  )[phone] and was told they would be faxing a form for [place] to fill out and send back "Urgent"   Per Dr Effie Shy, pt is to come to e.d. Every day for dose of med until pt receives actual prescription filled.    Called to patient at home and explained this and he states he will come in on Sunday for dose of med.  See doctor order sheet at charge nurse desk.

## 2012-08-17 ENCOUNTER — Other Ambulatory Visit: Payer: Self-pay | Admitting: *Deleted

## 2012-08-17 ENCOUNTER — Encounter: Payer: Self-pay | Admitting: Vascular Surgery

## 2012-08-17 DIAGNOSIS — Z4931 Encounter for adequacy testing for hemodialysis: Secondary | ICD-10-CM

## 2012-08-17 DIAGNOSIS — N186 End stage renal disease: Secondary | ICD-10-CM

## 2012-08-18 ENCOUNTER — Encounter: Payer: Self-pay | Admitting: Vascular Surgery

## 2012-08-18 ENCOUNTER — Other Ambulatory Visit: Payer: Self-pay | Admitting: *Deleted

## 2012-08-18 ENCOUNTER — Encounter: Payer: Self-pay | Admitting: *Deleted

## 2012-08-18 ENCOUNTER — Encounter (INDEPENDENT_AMBULATORY_CARE_PROVIDER_SITE_OTHER): Payer: Medicare Other | Admitting: *Deleted

## 2012-08-18 ENCOUNTER — Ambulatory Visit (INDEPENDENT_AMBULATORY_CARE_PROVIDER_SITE_OTHER): Payer: Medicare Other | Admitting: Vascular Surgery

## 2012-08-18 VITALS — BP 146/77 | HR 85 | Resp 20 | Ht 66.0 in | Wt 154.0 lb

## 2012-08-18 DIAGNOSIS — G8918 Other acute postprocedural pain: Secondary | ICD-10-CM

## 2012-08-18 DIAGNOSIS — N186 End stage renal disease: Secondary | ICD-10-CM

## 2012-08-18 DIAGNOSIS — Z4931 Encounter for adequacy testing for hemodialysis: Secondary | ICD-10-CM

## 2012-08-18 MED ORDER — OXYCODONE-ACETAMINOPHEN 5-325 MG PO TABS: ORAL_TABLET | ORAL | Status: DC

## 2012-08-18 NOTE — Addendum Note (Signed)
Addended by: Sharee Pimple on: 08/18/2012 03:02 PM   Modules accepted: Orders

## 2012-08-18 NOTE — Progress Notes (Signed)
[facility]  Postoperative Access Visit  History of Present Illness  [name] is a 61 y.o. year old male who presents for postoperative follow-up for: L first stage brachial vein transposition, L TDC removal, R fem TDC (Date: 07/15/12). The patient notes continued L arm ache and B leg pain.  He notes significant arthritis that requires he take narcotics to help him sleep.  The patient denies any problems with ADL with his left hand.  He denies any numbness in that arm.  Past Medical History  Diagnosis Date  . Hyperlipidemia   . ESRD (end stage renal disease)     a. MWF dialysis in [place] (followed by Dr. Befekadu)  . Arthritis   . Claudication   . Stroke 2005  . Anemia   . Chronic diastolic CHF (congestive heart failure) 07/2010    a. 07/2008 Echo EF 50-55%, mild-mod LVH, Gr 1 DD.  . Peripheral vascular occlusive disease     a. 07/2011 Periph Angio: No signif Ao-illiac dzs, LCF stenosis, 100% LSFA w recon above knee pop and 3 vessel runoff, 100% RSFA w/ recon above knee --> pending L Fem to below Knee pop bypass.  . History of tobacco abuse   . Hypertension     takes Amlodipine,Metoprolol,and Prinivil daily  . Dyspnea on exertion     with exertion  . GERD (gastroesophageal reflux disease)   . Cataract     bilateral  . Dialysis patient   . PUD (peptic ulcer disease) mid 1990's    with GI bleed.  endoscoped in mid 1990's at [facility]    Past Surgical History  Procedure Laterality Date  . Arteriovenous graft placement    . Av fistula placement  12/10/2000    Right brachiocephalic arteriovenous   . Hernia repair    . Aortagram  07/29/2011    Abdominal Aortagram  . Cardiac catheterization  08/01/11    Left heart catheterization  . Femoral-popliteal bypass graft  09/09/2011    Procedure: BYPASS GRAFT FEMORAL-POPLITEAL ARTERY;  Surgeon: Vance W Brabham, MD;  Location: [facility] OR;  Service: Vascular;  Laterality: Left;  . Amputation  09/12/2011     Procedure: AMPUTATION BELOW KNEE;  Surgeon: Vance W Brabham, MD;  Location: [facility] OR;  Service: Vascular;  Laterality: Left;  TRANSMETATARSAL  . Application of wound vac  10/30/2011    Procedure: APPLICATION OF WOUND VAC;  Surgeon: Vance W Brabham, MD;  Location: [facility] OR;  Service: Vascular;  Laterality: Left;  . Groin debridement  11/01/2011    Procedure: GROIN DEBRIDEMENT;  Surgeon: Christopher S Dickson, MD;  Location: [facility] OR;  Service: Vascular;  Laterality: Left;  . Esophagogastroduodenoscopy  11/06/2011    Procedure: ESOPHAGOGASTRODUODENOSCOPY (EGD);  Surgeon: Jay M Pyrtle, MD;  Location: [facility] ENDOSCOPY;  Service: Gastroenterology;  Laterality: N/A;  . Femoral-popliteal bypass graft  01/02/2012    Procedure: BYPASS GRAFT FEMORAL-POPLITEAL ARTERY;  Surgeon: Vance W Brabham, MD;  Location: [facility] OR;  Service: Vascular;  Laterality: Right;  . Amputation  01/02/2012    Procedure: AMPUTATION RAY;  Surgeon: Vance W Brabham, MD;  Location: [facility] OR;  Service: Vascular;  Laterality: Right;  . Toe amputation Right     5/13  . Exchange of a dialysis catheter N/A 07/15/2012    Procedure: EXCHANGE OF A DIALYSIS CATHETER;  Surgeon: Smith Mcnicholas L Kriste Broman, MD;  Location: [facility] OR;  Service: Vascular;  Laterality: N/A;  . Av fistula placement Left 07/15/2012    Procedure: ARTERIOVENOUS (AV)   FISTULA CREATION;  Surgeon: Hermila Millis L Kalyssa Anker, MD;  Location: [facility] OR;  Service: Vascular;  Laterality: Left;    History   Social History  . Marital Status: Legally Separated    Spouse Name: N/A    Number of Children: N/A  . Years of Education: N/A   Occupational History  . DISABLED    Social History Main Topics  . Smoking status: Former Smoker -- 0.30 packs/day for 40 years    Types: Cigarettes    Quit date: 06/13/2012  . Smokeless tobacco: Never Used     Comment: pt was given 1-800-QUIT-NOW  . Alcohol Use: No  . Drug Use: No  . Sexually Active: Not Currently   Other Topics Concern  . Not on file   Social History Narrative   Lives  in [place].  His cousin helps him out and drives him to appts. He quit smoking earlier this year.    Family History  Problem Relation Age of Onset  . Breast cancer Mother   . Cancer Mother   . Cancer Father   . Anesthesia problems Neg Hx   . Hypotension Neg Hx   . Malignant hyperthermia Neg Hx   . Pseudochol deficiency Neg Hx     Current Outpatient Prescriptions on File Prior to Visit  Medication Sig Dispense Refill  . ALPRAZolam (XANAX) 1 MG tablet Take 1 tablet by mouth daily.      . calcium acetate (PHOSLO) 667 MG capsule Take 1,334-2,001 mg by mouth 3 (three) times daily with meals.       . cinacalcet (SENSIPAR) 60 MG tablet Take 60 mg by mouth daily.      . FOSRENOL 1000 MG chewable tablet       . gabapentin (NEURONTIN) 100 MG capsule Take 100 mg by mouth daily.      . hydrALAZINE (APRESOLINE) 25 MG tablet Take 25 mg by mouth 3 (three) times daily.      . isosorbide mononitrate (IMDUR) 30 MG 24 hr tablet Take 30 mg by mouth daily.      . levofloxacin (LEVAQUIN) 500 MG tablet Take 1 tablet (500 mg total) by mouth daily.  9 tablet  0  . multivitamin (RENA-VIT) TABS tablet Take 1 tablet by mouth daily.      . oxyCODONE (ROXICODONE) 5 MG immediate release tablet Take 1 tablet (5 mg total) by mouth every 4 (four) hours as needed for pain.  30 tablet  0  . oxyCODONE-acetaminophen (PERCOCET/ROXICET) 5-325 MG per tablet Take 1 tablet by mouth every 4 (four) hours as needed for pain.  10 tablet  0  . sevelamer (RENVELA) 800 MG tablet Take 2 tablets (1,600 mg total) by mouth 3 (three) times daily with meals.  180 tablet  0  . simvastatin (ZOCOR) 5 MG tablet Take 5 mg by mouth at bedtime.      . traMADol (ULTRAM) 50 MG tablet Take 1 tablet (50 mg total) by mouth every 6 (six) hours as needed for pain.  30 tablet  0   No current facility-administered medications on file prior to visit.    Allergies  Allergen Reactions  . Ambien (Zolpidem Tartrate) Other (See Comments)    Hallucinations     Review of Systems (Positive items checked otherwise negative)  General: [ ] Weight loss, [ ] Weight gain, [ ]  Loss of appetite, [ ] Fever  Neurologic: [ ] Dizziness, [ ] Blackouts, [ ] Headaches, [ ] Seizure  Ear/Nose/Throat: [ ] Change in   eyesight, [ ] Change in hearing, [ ] Nose bleeds, [ ] Sore throat  Vascular: [ ] Pain in legs with walking, [ ] Pain in feet while lying flat, [ ] Non-healing ulcer, Stroke, [ ] "Mini stroke", [ ] Slurred speech, [ ] Temporary blindness, [ ] Blood clot in vein, [ ] Phlebitis  Pulmonary: [ ] Home oxygen, [ ] Productive cough, [ ] Bronchitis, [ ] Coughing up blood,  [ ] Asthma, [ ] Wheezing  Musculoskeletal: [x] Arthritis, [x] Joint pain, [ ] Muscle pain  Cardiac: [ ] Chest pain, [ ] Chest tightness/pressure, [ ] Shortness of breath when lying flat, [ ] Shortness of breath with exertion, [ ] Palpitations, [ ] Heart murmur, [ ] Arrythmia,  [ ] Atrial fibrillation  Hematologic: [ ] Bleeding problems, [ ] Clotting disorder, [ ] Anemia  Psychiatric:  [ ] Depression, [ ] Anxiety, [ ] Attention deficit disorder  Gastrointestinal:  [ ] Black stool,[ ]  Blood in stool, [ ] Peptic ulcer disease, [ ] Reflux, [ ] Hiatal hernia, [ ] Trouble swallowing, [ ] Diarrhea, [ ] Constipation  Urinary:  [x] Kidney disease, [ ] Burning with urination, [ ] Frequent urination, [ ] Difficulty urinating  Skin: [ ] Ulcers, [ ] Rashes  Physical Examination  Filed Vitals:   08/18/12 1417  BP: 146/77  Pulse: 85  Resp: 20   GEN: WDWN  PULM: CTAB, sym exp  CV: RRR, Nl S1, S2  LUE: Incision is healed, fluc.fluid collection unchanged without any erythema, skin feels warm, hand grip is 5/5, sensation in digits is intact, palpable thrill, bruit can be auscultated , On Sonosite: fistula <4.5 mm, palpable radial pulse  L access duplex (Date: 08/18/12)  PSV 827 c/s in perianastomotic segment  Medical Decision Making  [name] is a 61 y.o. year old male who  presents s/p L 1st BRVT, BLE PAD, OA  There is no evidence of dilation of the brachial vein in this patient.  Along with the high velocities in the distal fistula,I suspect this fistula is unlikely to mature.  From the venogram in his arms, unfortunately, this was his only chance for a fistula.  I am scheduling the patient for a LUA AVG on 17 JUN 14.  If the fistula is mature at that time, I will transpose the fistula, otherwise, I will ligate the fistula and place a new LUA AVG.  I gave him some additional pain medication to help him sleep with his significant OA and PAD: Percocet 325/5 mg 1-2 PO q4-6h prn pain #30, RF #0.  Thank you for allowing us to participate in this patient's care.  Ludy Messamore, MD [facility] Office: [phone] Pager: [phone]   

## 2012-08-20 ENCOUNTER — Ambulatory Visit: Payer: Medicare Other | Admitting: Vascular Surgery

## 2012-09-02 ENCOUNTER — Encounter (HOSPITAL_COMMUNITY): Payer: Self-pay | Admitting: Pharmacy Technician

## 2012-09-06 ENCOUNTER — Emergency Department (HOSPITAL_COMMUNITY)
Admission: EM | Admit: 2012-09-06 | Discharge: 2012-09-06 | Disposition: A | Discharge status: Home / Self Care | Payer: Medicare Other | Source: Home / Self Care | Attending: Emergency Medicine | Admitting: Emergency Medicine

## 2012-09-06 ENCOUNTER — Encounter (HOSPITAL_COMMUNITY): Payer: Self-pay | Admitting: Anesthesiology

## 2012-09-06 ENCOUNTER — Encounter (HOSPITAL_COMMUNITY): Payer: Self-pay | Admitting: *Deleted

## 2012-09-06 DIAGNOSIS — N186 End stage renal disease: Secondary | ICD-10-CM

## 2012-09-06 DIAGNOSIS — IMO0002 Reserved for concepts with insufficient information to code with codable children: Secondary | ICD-10-CM

## 2012-09-06 DIAGNOSIS — Z992 Dependence on renal dialysis: Secondary | ICD-10-CM

## 2012-09-06 NOTE — ED Notes (Signed)
Pt receives dialysis through femoral line on M,W,F. Scheduled to have another line placed in lt shoulder this tues.

## 2012-09-06 NOTE — ED Notes (Signed)
Called [facility] for transport back home  

## 2012-09-06 NOTE — ED Provider Notes (Signed)
History     CSN: [id number]  Arrival date & time 09/06/12  0116   First MD Initiated Contact with Patient 09/06/12 0132      Chief Complaint  Patient presents with  . Vascular Access Problem    pt has a femoral access line, feels like it is coming out    (Consider location/radiation/quality/duration/timing/severity/associated sxs/prior treatment) HPI HPI Comments: [name] is a 62 y.o. male with ESRD who dialyzes M-W-F  who presents to the Emergency Department complaining of his quinton catheter in his right groin  having come loose and pulled out partially. He is scheduled to have dialysis tomorrow.  PCP Dr. Olena Leatherwood Nephrologist Dr. Fausto Skillern  Past Medical History  Diagnosis Date  . Hyperlipidemia   . ESRD (end stage renal disease)     a. MWF dialysis in [place] (followed by Dr. Kristian Covey)  . Arthritis   . Claudication   . Stroke 2005  . Anemia   . Chronic diastolic CHF (congestive heart failure) 07/2010    a. 07/2008 Echo EF 50-55%, mild-mod LVH, Gr 1 DD.  Marland Kitchen Peripheral vascular occlusive disease     a. 07/2011 Periph Angio: No signif Ao-illiac dzs, LCF stenosis, 100% LSFA w recon above knee pop and 3 vessel runoff, 100% RSFA w/ recon above knee --> pending L Fem to below Knee pop bypass.  Marland Kitchen History of tobacco abuse   . Hypertension     takes Amlodipine,Metoprolol,and Prinivil daily  . Dyspnea on exertion     with exertion  . GERD (gastroesophageal reflux disease)   . Cataract     bilateral  . Dialysis patient   . PUD (peptic ulcer disease) mid 1990's    with GI bleed.  endoscoped in mid 1990's at [facility]    Past Surgical History  Procedure Laterality Date  . Arteriovenous graft placement    . Av fistula placement  12/10/2000    Right brachiocephalic arteriovenous   . Hernia repair    . Aortagram  07/29/2011    Abdominal Aortagram  . Cardiac catheterization  08/01/11    Left heart catheterization  . Femoral-popliteal bypass graft  09/09/2011     Procedure: BYPASS GRAFT FEMORAL-POPLITEAL ARTERY;  Surgeon: [name], MD;  Location: [facility] OR;  Service: Vascular;  Laterality: Left;  . Amputation  09/12/2011    Procedure: AMPUTATION BELOW KNEE;  Surgeon: [name], MD;  Location: [facility] OR;  Service: Vascular;  Laterality: Left;  TRANSMETATARSAL  . Application of wound vac  10/30/2011    Procedure: APPLICATION OF WOUND VAC;  Surgeon: [name], MD;  Location: [facility] OR;  Service: Vascular;  Laterality: Left;  . Groin debridement  11/01/2011    Procedure: Drucie Ip DEBRIDEMENT;  Surgeon: Chuck Hint, MD;  Location: [facility] OR;  Service: Vascular;  Laterality: Left;  . Esophagogastroduodenoscopy  11/06/2011    Procedure: ESOPHAGOGASTRODUODENOSCOPY (EGD);  Surgeon: Beverley Fiedler, MD;  Location: [facility] ENDOSCOPY;  Service: Gastroenterology;  Laterality: N/A;  . Femoral-popliteal bypass graft  01/02/2012    Procedure: BYPASS GRAFT FEMORAL-POPLITEAL ARTERY;  Surgeon: [name], MD;  Location: [facility] OR;  Service: Vascular;  Laterality: Right;  . Amputation  01/02/2012    Procedure: AMPUTATION RAY;  Surgeon: [name], MD;  Location: [facility] OR;  Service: Vascular;  Laterality: Right;  . Toe amputation Right     5/13  . Exchange of a dialysis catheter N/A 07/15/2012    Procedure: EXCHANGE OF A DIALYSIS CATHETER;  Surgeon: Arlys John  Rolena Infante, MD;  Location: [facility] OR;  Service: Vascular;  Laterality: N/A;  . Av fistula placement Left 07/15/2012    Procedure: ARTERIOVENOUS (AV) FISTULA CREATION;  Surgeon: Fransisco Hertz, MD;  Location:  [facility] OR;  Service: Vascular;  Laterality: Left;    Family History  Problem Relation Age of Onset  . Breast cancer Mother   . Cancer Mother   . Cancer Father   . Anesthesia problems Neg Hx   . Hypotension Neg Hx   . Malignant hyperthermia Neg Hx   . Pseudochol deficiency Neg Hx     History  Substance Use Topics  . Smoking status: Former Smoker -- 0.30 packs/day for 40 years    Types: Cigarettes    Quit date: 06/13/2012   . Smokeless tobacco: Never Used     Comment: pt was given 1-800-QUIT-NOW  . Alcohol Use: No      Review of Systems  Constitutional: Negative for fever.       10 Systems reviewed and are negative for acute change except as noted in the HPI.  HENT: Negative for congestion.   Eyes: Negative for discharge and redness.  Respiratory: Negative for cough and shortness of breath.   Cardiovascular: Negative for chest pain.  Gastrointestinal: Negative for vomiting and abdominal pain.  Musculoskeletal: Negative for back pain.  Skin: Negative for rash.  Neurological: Negative for syncope, numbness and headaches.  Psychiatric/Behavioral:       No behavior change.    Allergies  Ambien  Home Medications   Current Outpatient Rx  Name  Route  Sig  Dispense  Refill  . ALPRAZolam (XANAX) 1 MG tablet   Oral   Take 1 tablet by mouth daily.         . calcium acetate (PHOSLO) 667 MG capsule   Oral   Take 1,334-2,001 mg by mouth 3 (three) times daily with meals.          . cinacalcet (SENSIPAR) 60 MG tablet   Oral   Take 60 mg by mouth daily.         Marland Kitchen FOSRENOL 1000 MG chewable tablet   Oral   Chew 1,000 mg by mouth 2 (two) times daily with a meal.          . gabapentin (NEURONTIN) 100 MG capsule   Oral   Take 100 mg by mouth daily.         . hydrALAZINE (APRESOLINE) 25 MG tablet   Oral   Take 25 mg by mouth 3 (three) times daily.         . isosorbide mononitrate (IMDUR) 30 MG 24 hr tablet   Oral   Take 30 mg by mouth daily.         Marland Kitchen levofloxacin (LEVAQUIN) 500 MG tablet   Oral   Take 1 tablet (500 mg total) by mouth daily.   9 tablet   0   . multivitamin (RENA-VIT) TABS tablet   Oral   Take 1 tablet by mouth daily.         . naproxen sodium (ALEVE) 220 MG tablet   Oral   Take 440 mg by mouth daily as needed (For headache.).         Marland Kitchen sevelamer (RENVELA) 800 MG tablet   Oral   Take 2 tablets (1,600 mg total) by mouth 3 (three) times daily with  meals.   180 tablet   0   . simvastatin (ZOCOR) 5 MG tablet   Oral  Take 5 mg by mouth at bedtime.         . traMADol (ULTRAM) 50 MG tablet   Oral   Take 50 mg by mouth every 6 (six) hours as needed for pain.           BP 139/45  Pulse 85  Temp(Src) 98.6 F (37 C) (Oral)  Resp 20  Ht 5\' 5"  (1.651 m)  SpO2 94%  Physical Exam  Nursing note and vitals reviewed. Constitutional: He appears well-developed and well-nourished.  Awake, alert, nontoxic appearance.  HENT:  Head: Normocephalic and atraumatic.  Eyes: EOM are normal. Pupils are equal, round, and reactive to light.  Neck: Neck supple.  Cardiovascular: Normal rate and intact distal pulses.   Pulmonary/Chest: Effort normal and breath sounds normal. He exhibits no tenderness.  Abdominal: Soft. Bowel sounds are normal. There is no tenderness. There is no rebound.  Right groin catheter with catheter pulled almost out.Taped in place.  Musculoskeletal: He exhibits no tenderness.  Baseline ROM, no obvious new focal weakness.Left transmetatarsal amputation.   Neurological:  Mental status and motor strength appears baseline for patient and situation.  Skin: No rash noted.  Bilateral AVG in both arms non functional .  Psychiatric: He has a normal mood and affect.    ED Course  Procedures (including critical care time)    1. Dialysis catheter clot or failure       MDM  Patient with ESRD with a catheter in his right groin that has pulled almost out. He will call dialysis in the AM and have them arrange for him to go to [place] , have the catheter replaced and get dialysis in [place]. Pt stable in ED with no significant deterioration in condition.The patient appears reasonably screened and/or stabilized for discharge and I doubt any other medical condition or other [facility] requiring further screening, evaluation, or treatment in the ED at this time prior to discharge.  MDM Reviewed: nursing note and  vitals           Nicoletta Dress. Colon Branch, MD 09/06/12 1610

## 2012-09-06 NOTE — ED Notes (Addendum)
Per [facility], pt from independent living for dialysis problems. States his right groin dialysis catheter came out last night or sometime this morning. Is a M/W/F dialysis patient. Was scheduled to go this morning and has not taken any medications yet. Normally takes them at night. NSR on the monitor with PVC. Has pain in his feet from amputations.

## 2012-09-06 NOTE — ED Provider Notes (Signed)
History    62 year old male presenting after his right femoral dialysis access was removed. Patient was evaluated in emergency room last night pain. His catheter has since come completely out. He has not tried contacting his vascular surgeon, nephrologist or dialysis center. She has no other acute complaints. Bleeding in his right groin has been controlled. No dizziness, lightheadedness or shortness of breath. No use of blood thinning medications. Patient was last dialyzed on Friday.Pt is s/p 1st stage of  L brachiobrachial arteriovenous fistula placement on 4/24. Temporary r femoral catheter was placed at that time. And in    CSN: [id number]  Arrival date & time 09/06/12  9629   First MD Initiated Contact with Patient 09/06/12 1007      Chief Complaint  Patient presents with  . Vascular Access Problem    (Consider location/radiation/quality/duration/timing/severity/associated sxs/prior treatment) HPI  Past Medical History  Diagnosis Date  . Hyperlipidemia   . ESRD (end stage renal disease)     a. MWF dialysis in [place] (followed by Dr. Kristian Covey)  . Arthritis   . Claudication   . Stroke 2005  . Anemia   . Chronic diastolic CHF (congestive heart failure) 07/2010    a. 07/2008 Echo EF 50-55%, mild-mod LVH, Gr 1 DD.  Marland Kitchen Peripheral vascular occlusive disease     a. 07/2011 Periph Angio: No signif Ao-illiac dzs, LCF stenosis, 100% LSFA w recon above knee pop and 3 vessel runoff, 100% RSFA w/ recon above knee --> pending L Fem to below Knee pop bypass.  Marland Kitchen History of tobacco abuse   . Hypertension     takes Amlodipine,Metoprolol,and Prinivil daily  . Dyspnea on exertion     with exertion  . GERD (gastroesophageal reflux disease)   . Cataract     bilateral  . Dialysis patient   . PUD (peptic ulcer disease) mid 1990's    with GI bleed.  endoscoped in mid 1990's at [facility]    Past Surgical History  Procedure Laterality Date  . Arteriovenous graft placement    . Av  fistula placement  12/10/2000    Right brachiocephalic arteriovenous   . Hernia repair    . Aortagram  07/29/2011    Abdominal Aortagram  . Cardiac catheterization  08/01/11    Left heart catheterization  . Femoral-popliteal bypass graft  09/09/2011    Procedure: BYPASS GRAFT FEMORAL-POPLITEAL ARTERY;  Surgeon: [name], MD;  Location: [facility] OR;  Service: Vascular;  Laterality: Left;  . Amputation  09/12/2011    Procedure: AMPUTATION BELOW KNEE;  Surgeon: [name], MD;  Location: [facility] OR;  Service: Vascular;  Laterality: Left;  TRANSMETATARSAL  . Application of wound vac  10/30/2011    Procedure: APPLICATION OF WOUND VAC;  Surgeon: [name], MD;  Location: [facility] OR;  Service: Vascular;  Laterality: Left;  . Groin debridement  11/01/2011    Procedure: Drucie Ip DEBRIDEMENT;  Surgeon: Chuck Hint, MD;  Location: [facility] OR;  Service: Vascular;  Laterality: Left;  . Esophagogastroduodenoscopy  11/06/2011    Procedure: ESOPHAGOGASTRODUODENOSCOPY (EGD);  Surgeon: Beverley Fiedler, MD;  Location: [facility] ENDOSCOPY;  Service: Gastroenterology;  Laterality: N/A;  . Femoral-popliteal bypass graft  01/02/2012    Procedure: BYPASS GRAFT FEMORAL-POPLITEAL ARTERY;  Surgeon: [name], MD;  Location: [facility] OR;  Service: Vascular;  Laterality: Right;  . Amputation  01/02/2012    Procedure: AMPUTATION RAY;  Surgeon: [name], MD;  Location: [facility] OR;  Service: Vascular;  Laterality: Right;  .  Toe amputation Right     5/13  . Exchange of a dialysis catheter N/A 07/15/2012    Procedure: EXCHANGE OF A DIALYSIS CATHETER;  Surgeon: Fransisco Hertz, MD;  Location: [facility] OR;  Service: Vascular;  Laterality: N/A;  . Av fistula placement Left 07/15/2012    Procedure: ARTERIOVENOUS (AV) FISTULA CREATION;  Surgeon: Fransisco Hertz, MD;  Location: [facility] OR;  Service: Vascular;  Laterality: Left;    Family History  Problem Relation Age of Onset  . Breast cancer Mother   . Cancer Mother   . Cancer Father   . Anesthesia  problems Neg Hx   . Hypotension Neg Hx   . Malignant hyperthermia Neg Hx   . Pseudochol deficiency Neg Hx     History  Substance Use Topics  . Smoking status: Former Smoker -- 0.30 packs/day for 40 years    Types: Cigarettes    Quit date: 06/13/2012  . Smokeless tobacco: Never Used     Comment: pt was given 1-800-QUIT-NOW  . Alcohol Use: No      Review of Systems  All systems reviewed and negative, other than as noted in HPI.   Allergies  Ambien  Home Medications   Current Outpatient Rx  Name  Route  Sig  Dispense  Refill  . ALPRAZolam (XANAX) 1 MG tablet   Oral   Take 1 tablet by mouth daily.         . calcium acetate (PHOSLO) 667 MG capsule   Oral   Take 1,334-2,001 mg by mouth 3 (three) times daily with meals.          . cinacalcet (SENSIPAR) 60 MG tablet   Oral   Take 60 mg by mouth daily.         Marland Kitchen FOSRENOL 1000 MG chewable tablet   Oral   Chew 1,000 mg by mouth 2 (two) times daily with a meal.          . gabapentin (NEURONTIN) 100 MG capsule   Oral   Take 100 mg by mouth daily.         . hydrALAZINE (APRESOLINE) 25 MG tablet   Oral   Take 25 mg by mouth 3 (three) times daily.         . isosorbide mononitrate (IMDUR) 30 MG 24 hr tablet   Oral   Take 30 mg by mouth daily.         . multivitamin (RENA-VIT) TABS tablet   Oral   Take 1 tablet by mouth daily.         . naproxen sodium (ALEVE) 220 MG tablet   Oral   Take 440 mg by mouth daily as needed (For headache.).         Marland Kitchen sevelamer (RENVELA) 800 MG tablet   Oral   Take 2 tablets (1,600 mg total) by mouth 3 (three) times daily with meals.   180 tablet   0   . simvastatin (ZOCOR) 5 MG tablet   Oral   Take 5 mg by mouth at bedtime.         . traMADol (ULTRAM) 50 MG tablet   Oral   Take 50 mg by mouth every 6 (six) hours as needed for pain.         Marland Kitchen levofloxacin (LEVAQUIN) 500 MG tablet   Oral   Take 1 tablet (500 mg total) by mouth daily.   9 tablet   0      BP 137/64  Pulse  78  Temp(Src) 98.6 F (37 C) (Oral)  Resp 14  SpO2 100%  Physical Exam  Nursing note and vitals reviewed. Constitutional: He appears well-developed and well-nourished. No distress.  HENT:  Head: Normocephalic and atraumatic.  Eyes: Conjunctivae are normal. Right eye exhibits no discharge. Left eye exhibits no discharge.  Neck: Neck supple.  Cardiovascular: Normal rate, regular rhythm and normal heart sounds.  Exam reveals no gallop and no friction rub.   No murmur heard. Site of r femoral cathter looks fine. No active bleeding. No hematoma appreciated. Palpable thrill fistula L upper arm.   Pulmonary/Chest: Effort normal and breath sounds normal. No respiratory distress.  Abdominal: Soft. He exhibits no distension. There is no tenderness.  Musculoskeletal: He exhibits no edema and no tenderness.  Neurological: He is alert.  Skin: Skin is warm and dry.  Psychiatric: He has a normal mood and affect. His behavior is normal. Thought content normal.    ED Course  Procedures (including critical care time)  Labs Reviewed - No data to display No results found.   1. Encounter regarding vascular access for dialysis for ESRD       MDM  62 year old male presenting after his dialysis access was removed from R groin. Seems to be hemostatic exam. Dialysis Monday, Wednesday and Friday. Will discuss with vascular surgery.  10:32 AM Discussed with Dr Imogene Burn via OR nurse. Pt previously scheduled to be seen be vascular tomorrow. Access issue will be addressed then. Pt with no other acute complaints and HD stable. I do not anticipate significant issues if dialysis was delayed a day.         Raeford Razor, MD 09/07/12 [phone]

## 2012-09-06 NOTE — ED Notes (Signed)
Pt has a femoral access line placed at [facility] 2 months prior, pt states it feels like his line is coming out, some dried blood is noted at site on rt groin/thigh area

## 2012-09-06 NOTE — ED Notes (Signed)
Discharge instructions reviewed with pt, questions answered. Pt verbalized understanding.  

## 2012-09-06 NOTE — ED Notes (Signed)
Pt found his aide's number, called aide, Charlena. She will be coming to pick patient up in 30 mins. Pt provided sprite and graham crackers. Will wait in waiting room in wheelchair,

## 2012-09-07 ENCOUNTER — Ambulatory Visit (HOSPITAL_COMMUNITY): Payer: Medicare Other

## 2012-09-07 ENCOUNTER — Encounter (HOSPITAL_COMMUNITY)
Admission: RE | Disposition: A | Discharge status: Home / Self Care | Payer: Self-pay | Source: Ambulatory Visit | Attending: Vascular Surgery

## 2012-09-07 ENCOUNTER — Ambulatory Visit (HOSPITAL_COMMUNITY): Payer: Medicare Other | Admitting: Anesthesiology

## 2012-09-07 ENCOUNTER — Ambulatory Visit (HOSPITAL_COMMUNITY)
Admission: RE | Admit: 2012-09-07 | Discharge: 2012-09-07 | Disposition: A | Discharge status: Home / Self Care | Payer: Medicare Other | Source: Ambulatory Visit | Attending: Vascular Surgery | Admitting: Vascular Surgery

## 2012-09-07 ENCOUNTER — Ambulatory Visit (HOSPITAL_COMMUNITY): Admission: RE | Admit: 2012-09-07 | Payer: Medicare Other | Source: Ambulatory Visit | Admitting: Vascular Surgery

## 2012-09-07 ENCOUNTER — Encounter (HOSPITAL_COMMUNITY): Payer: Self-pay | Admitting: Anesthesiology

## 2012-09-07 ENCOUNTER — Telehealth: Payer: Self-pay | Admitting: Vascular Surgery

## 2012-09-07 DIAGNOSIS — Z992 Dependence on renal dialysis: Secondary | ICD-10-CM | POA: Insufficient documentation

## 2012-09-07 DIAGNOSIS — Z888 Allergy status to other drugs, medicaments and biological substances status: Secondary | ICD-10-CM | POA: Insufficient documentation

## 2012-09-07 DIAGNOSIS — D649 Anemia, unspecified: Secondary | ICD-10-CM | POA: Insufficient documentation

## 2012-09-07 DIAGNOSIS — K219 Gastro-esophageal reflux disease without esophagitis: Secondary | ICD-10-CM | POA: Insufficient documentation

## 2012-09-07 DIAGNOSIS — IMO0002 Reserved for concepts with insufficient information to code with codable children: Secondary | ICD-10-CM | POA: Insufficient documentation

## 2012-09-07 DIAGNOSIS — I120 Hypertensive chronic kidney disease with stage 5 chronic kidney disease or end stage renal disease: Secondary | ICD-10-CM | POA: Insufficient documentation

## 2012-09-07 DIAGNOSIS — N186 End stage renal disease: Secondary | ICD-10-CM

## 2012-09-07 DIAGNOSIS — I509 Heart failure, unspecified: Secondary | ICD-10-CM | POA: Insufficient documentation

## 2012-09-07 DIAGNOSIS — M129 Arthropathy, unspecified: Secondary | ICD-10-CM | POA: Insufficient documentation

## 2012-09-07 DIAGNOSIS — T82598A Other mechanical complication of other cardiac and vascular devices and implants, initial encounter: Secondary | ICD-10-CM | POA: Insufficient documentation

## 2012-09-07 DIAGNOSIS — I5032 Chronic diastolic (congestive) heart failure: Secondary | ICD-10-CM | POA: Insufficient documentation

## 2012-09-07 DIAGNOSIS — E785 Hyperlipidemia, unspecified: Secondary | ICD-10-CM | POA: Insufficient documentation

## 2012-09-07 DIAGNOSIS — I70219 Atherosclerosis of native arteries of extremities with intermittent claudication, unspecified extremity: Secondary | ICD-10-CM | POA: Insufficient documentation

## 2012-09-07 DIAGNOSIS — Z8673 Personal history of transient ischemic attack (TIA), and cerebral infarction without residual deficits: Secondary | ICD-10-CM | POA: Insufficient documentation

## 2012-09-07 DIAGNOSIS — Z79899 Other long term (current) drug therapy: Secondary | ICD-10-CM | POA: Insufficient documentation

## 2012-09-07 DIAGNOSIS — Y832 Surgical operation with anastomosis, bypass or graft as the cause of abnormal reaction of the patient, or of later complication, without mention of misadventure at the time of the procedure: Secondary | ICD-10-CM | POA: Insufficient documentation

## 2012-09-07 DIAGNOSIS — Z87891 Personal history of nicotine dependence: Secondary | ICD-10-CM | POA: Insufficient documentation

## 2012-09-07 HISTORY — PX: LIPOMA EXCISION: SHX5283

## 2012-09-07 HISTORY — PX: AV FISTULA PLACEMENT: SHX1204

## 2012-09-07 HISTORY — PX: INSERTION OF DIALYSIS CATHETER: SHX1324

## 2012-09-07 LAB — POCT I-STAT 4, (NA,K, GLUC, HGB,HCT)
Glucose, Bld: 98 mg/dL (ref 70–99)
HCT: 38 % — ABNORMAL LOW (ref 39.0–52.0)
Hemoglobin: 12.9 g/dL — ABNORMAL LOW (ref 13.0–17.0)
Potassium: 6.2 mEq/L — ABNORMAL HIGH (ref 3.5–5.1)
Sodium: 135 mEq/L (ref 135–145)

## 2012-09-07 LAB — SURGICAL PCR SCREEN
MRSA, PCR: NEGATIVE
Staphylococcus aureus: NEGATIVE

## 2012-09-07 SURGERY — INSERTION OF ARTERIOVENOUS (AV) GORE-TEX GRAFT ARM
Anesthesia: General | Site: Groin | Laterality: Right | Wound class: Clean

## 2012-09-07 SURGERY — INSERTION OF ARTERIOVENOUS (AV) GORE-TEX GRAFT ARM
Anesthesia: Monitor Anesthesia Care | Site: Arm Upper | Laterality: Left

## 2012-09-07 MED ORDER — THROMBIN 20000 UNITS EX SOLR
CUTANEOUS | Status: AC
  Filled 2012-09-07: qty 20000

## 2012-09-07 MED ORDER — SODIUM CHLORIDE 0.9 % IV SOLN
INTRAVENOUS | Status: DC
  Administered 2012-09-07: 08:00:00 via INTRAVENOUS

## 2012-09-07 MED ORDER — OXYCODONE HCL 5 MG/5ML PO SOLN: 5.0000 mg | Freq: Once | ORAL | Status: DC | PRN

## 2012-09-07 MED ORDER — HEPARIN SODIUM (PORCINE) 1000 UNIT/ML IJ SOLN
INTRAMUSCULAR | Status: DC | PRN
  Administered 2012-09-07: 5000 [IU] via INTRAVENOUS

## 2012-09-07 MED ORDER — LIDOCAINE-EPINEPHRINE (PF) 1 %-1:200000 IJ SOLN
INTRAMUSCULAR | Status: AC
  Filled 2012-09-07: qty 10

## 2012-09-07 MED ORDER — SODIUM CHLORIDE 0.9 % IV BOLUS (SEPSIS)
250.0000 mL | Freq: Once | INTRAVENOUS | Status: AC
  Administered 2012-09-07: 250 mL via INTRAVENOUS

## 2012-09-07 MED ORDER — OXYCODONE-ACETAMINOPHEN 5-325 MG PO TABS: 1.0000 | ORAL_TABLET | Freq: Four times a day (QID) | ORAL | Status: DC | PRN

## 2012-09-07 MED ORDER — FENTANYL CITRATE 0.05 MG/ML IJ SOLN
INTRAMUSCULAR | Status: DC | PRN
  Administered 2012-09-07 (×2): 50 ug via INTRAVENOUS

## 2012-09-07 MED ORDER — BUPIVACAINE HCL (PF) 0.5 % IJ SOLN
INTRAMUSCULAR | Status: DC | PRN
  Administered 2012-09-07: 10 mL
  Administered 2012-09-07: 15 mL

## 2012-09-07 MED ORDER — LIDOCAINE-EPINEPHRINE (PF) 1 %-1:200000 IJ SOLN
INTRAMUSCULAR | Status: DC | PRN
  Administered 2012-09-07: 15 mL
  Administered 2012-09-07: 10 mL

## 2012-09-07 MED ORDER — HEPARIN SODIUM (PORCINE) 1000 UNIT/ML IJ SOLN
INTRAMUSCULAR | Status: AC
  Filled 2012-09-07: qty 1

## 2012-09-07 MED ORDER — MUPIROCIN 2 % EX OINT
TOPICAL_OINTMENT | CUTANEOUS | Status: AC
  Filled 2012-09-07: qty 22

## 2012-09-07 MED ORDER — BUPIVACAINE HCL (PF) 0.5 % IJ SOLN
INTRAMUSCULAR | Status: AC
  Filled 2012-09-07: qty 30

## 2012-09-07 MED ORDER — SODIUM CHLORIDE 0.9 % IR SOLN
Status: DC | PRN
  Administered 2012-09-07: 08:00:00

## 2012-09-07 MED ORDER — ONDANSETRON HCL 4 MG/2ML IJ SOLN
INTRAMUSCULAR | Status: DC | PRN
  Administered 2012-09-07: 4 mg via INTRAVENOUS

## 2012-09-07 MED ORDER — THROMBIN 20000 UNITS EX KIT
PACK | CUTANEOUS | Status: DC | PRN
  Administered 2012-09-07: 10:00:00 via TOPICAL

## 2012-09-07 MED ORDER — PROTAMINE SULFATE 10 MG/ML IV SOLN
INTRAVENOUS | Status: DC | PRN
  Administered 2012-09-07 (×2): 15 mg via INTRAVENOUS

## 2012-09-07 MED ORDER — PROPOFOL 10 MG/ML IV BOLUS
INTRAVENOUS | Status: DC | PRN
  Administered 2012-09-07: 100 mg via INTRAVENOUS

## 2012-09-07 MED ORDER — PHENYLEPHRINE HCL 10 MG/ML IJ SOLN
INTRAMUSCULAR | Status: DC | PRN
  Administered 2012-09-07: 80 ug via INTRAVENOUS
  Administered 2012-09-07 (×2): 160 ug via INTRAVENOUS
  Administered 2012-09-07 (×2): 80 ug via INTRAVENOUS
  Administered 2012-09-07 (×3): 160 ug via INTRAVENOUS
  Administered 2012-09-07: 80 ug via INTRAVENOUS

## 2012-09-07 MED ORDER — DEXTROSE 5 % IV SOLN
1.5000 g | INTRAVENOUS | Status: AC
  Administered 2012-09-07: 1.5 g via INTRAVENOUS
  Filled 2012-09-07: qty 1.5

## 2012-09-07 MED ORDER — MUPIROCIN 2 % EX OINT
TOPICAL_OINTMENT | Freq: Two times a day (BID) | CUTANEOUS | Status: DC
  Administered 2012-09-07: 1 via NASAL

## 2012-09-07 MED ORDER — OXYCODONE HCL 5 MG PO TABS: 5.0000 mg | ORAL_TABLET | Freq: Once | ORAL | Status: DC | PRN

## 2012-09-07 MED ORDER — HEPARIN SODIUM (PORCINE) 1000 UNIT/ML IJ SOLN
INTRAMUSCULAR | Status: DC | PRN
  Administered 2012-09-07: 7 mL

## 2012-09-07 MED ORDER — 0.9 % SODIUM CHLORIDE (POUR BTL) OPTIME
TOPICAL | Status: DC | PRN
  Administered 2012-09-07: 1000 mL

## 2012-09-07 MED ORDER — FENTANYL CITRATE 0.05 MG/ML IJ SOLN: 25.0000 ug | INTRAMUSCULAR | Status: DC | PRN

## 2012-09-07 MED ORDER — LIDOCAINE HCL (CARDIAC) 20 MG/ML IV SOLN
INTRAVENOUS | Status: DC | PRN
  Administered 2012-09-07: 80 mg via INTRAVENOUS

## 2012-09-07 SURGICAL SUPPLY — 45 items
ARMBAND PINK RESTRICT EXTREMIT (MISCELLANEOUS) ×3 IMPLANT
CANISTER SUCTION 2500CC (MISCELLANEOUS) ×3 IMPLANT
CATH CANNON HEMO 15F 50CM (CATHETERS) ×3 IMPLANT
CATH STRAIGHT 5FR 65CM (CATHETERS) ×3 IMPLANT
CLIP TI MEDIUM 6 (CLIP) ×6 IMPLANT
CLIP TI WIDE RED SMALL 6 (CLIP) ×6 IMPLANT
CLOTH BEACON ORANGE TIMEOUT ST (SAFETY) ×3 IMPLANT
COVER PROBE W GEL 5X96 (DRAPES) ×6 IMPLANT
COVER SURGICAL LIGHT HANDLE (MISCELLANEOUS) ×3 IMPLANT
DECANTER SPIKE VIAL GLASS SM (MISCELLANEOUS) ×3 IMPLANT
DERMABOND ADVANCED (GAUZE/BANDAGES/DRESSINGS) ×1
DERMABOND ADVANCED .7 DNX12 (GAUZE/BANDAGES/DRESSINGS) ×2 IMPLANT
ELECT REM PT RETURN 9FT ADLT (ELECTROSURGICAL) ×3
ELECTRODE REM PT RTRN 9FT ADLT (ELECTROSURGICAL) ×2 IMPLANT
GAUZE SPONGE 2X2 8PLY STRL LF (GAUZE/BANDAGES/DRESSINGS) ×2 IMPLANT
GLOVE BIO SURGEON STRL SZ7 (GLOVE) ×15 IMPLANT
GLOVE BIO SURGEON STRL SZ7.5 (GLOVE) ×12 IMPLANT
GLOVE BIOGEL PI IND STRL 6.5 (GLOVE) ×4 IMPLANT
GLOVE BIOGEL PI IND STRL 7.5 (GLOVE) ×10 IMPLANT
GLOVE BIOGEL PI INDICATOR 6.5 (GLOVE) ×2
GLOVE BIOGEL PI INDICATOR 7.5 (GLOVE) ×5
GOWN STRL NON-REIN LRG LVL3 (GOWN DISPOSABLE) ×18 IMPLANT
GRAFT GORETEX STRT 4-7X45 (Vascular Products) ×3 IMPLANT
KIT BASIN OR (CUSTOM PROCEDURE TRAY) ×3 IMPLANT
KIT ROOM TURNOVER OR (KITS) ×3 IMPLANT
NEEDLE 18GX1X1/2 (RX/OR ONLY) (NEEDLE) ×3 IMPLANT
NS IRRIG 1000ML POUR BTL (IV SOLUTION) ×3 IMPLANT
PACK CV ACCESS (CUSTOM PROCEDURE TRAY) ×3 IMPLANT
PAD ARMBOARD 7.5X6 YLW CONV (MISCELLANEOUS) ×6 IMPLANT
SPONGE GAUZE 2X2 STER 10/PKG (GAUZE/BANDAGES/DRESSINGS) ×1
SPONGE SURGIFOAM ABS GEL 100 (HEMOSTASIS) IMPLANT
SUT ETHILON 3 0 PS 1 (SUTURE) ×3 IMPLANT
SUT MNCRL AB 4-0 PS2 18 (SUTURE) ×6 IMPLANT
SUT PROLENE 5 0 C 1 24 (SUTURE) ×3 IMPLANT
SUT PROLENE 6 0 BV (SUTURE) ×6 IMPLANT
SUT PROLENE 7 0 BV 1 (SUTURE) IMPLANT
SUT SILK 2 0 FS (SUTURE) ×3 IMPLANT
SUT VIC AB 3-0 SH 27 (SUTURE) ×3
SUT VIC AB 3-0 SH 27X BRD (SUTURE) ×6 IMPLANT
SYRINGE 10CC LL (SYRINGE) ×3 IMPLANT
TOWEL OR 17X24 6PK STRL BLUE (TOWEL DISPOSABLE) ×3 IMPLANT
TOWEL OR 17X26 10 PK STRL BLUE (TOWEL DISPOSABLE) ×3 IMPLANT
UNDERPAD 30X30 INCONTINENT (UNDERPADS AND DIAPERS) ×3 IMPLANT
WATER STERILE IRR 1000ML POUR (IV SOLUTION) ×3 IMPLANT
WIRE AMPLATZ SS-J .035X180CM (WIRE) ×3 IMPLANT

## 2012-09-07 NOTE — Preoperative (Signed)
Beta Blockers   Reason not to administer Beta Blockers:Not Applicable 

## 2012-09-07 NOTE — Transfer of Care (Signed)
Immediate Anesthesia Transfer of Care Note  Patient: [name]  Procedure(s) Performed: Procedure(s): INSERTION OF ARTERIOVENOUS (AV) GORE-TEX GRAFT ARM (Left) INSERTION OF a Femoral DIALYSIS CATHETER (Right) EXCISION Seroma left arm (Left)  Patient Location: PACU  Anesthesia Type:General  Level of Consciousness: awake, alert  and oriented  Airway & Oxygen Therapy: Patient Spontanous Breathing and Patient connected to nasal cannula oxygen  Post-op Assessment: Report given to PACU RN and Post -op Vital signs reviewed and stable  Post vital signs: Reviewed and stable  Complications: No apparent anesthesia complications

## 2012-09-07 NOTE — Progress Notes (Signed)
Report to Dr. Gypsy Balsam that attempts have been made to reach aide so that there is a person to accompany the pt. Home today, no success at present.

## 2012-09-07 NOTE — H&P (View-Only) (Signed)
[facility] [place]  Postoperative Access Visit  History of Present Illness  [name] is a 62 y.o. year old male who presents for postoperative follow-up for: L first stage brachial vein transposition, L TDC removal, R fem TDC (Date: 07/15/12). The patient notes continued L arm ache and B leg pain.  He notes significant arthritis that requires he take narcotics to help him sleep.  The patient denies any problems with ADL with his left hand.  He denies any numbness in that arm.  Past Medical History  Diagnosis Date  . Hyperlipidemia   . ESRD (end stage renal disease)     a. MWF dialysis in [place] (followed by Dr. Kristian Covey)  . Arthritis   . Claudication   . Stroke 2005  . Anemia   . Chronic diastolic CHF (congestive heart failure) 07/2010    a. 07/2008 Echo EF 50-55%, mild-mod LVH, Gr 1 DD.  Marland Kitchen Peripheral vascular occlusive disease     a. 07/2011 Periph Angio: No signif Ao-illiac dzs, LCF stenosis, 100% LSFA w recon above knee pop and 3 vessel runoff, 100% RSFA w/ recon above knee --> pending L Fem to below Knee pop bypass.  Marland Kitchen History of tobacco abuse   . Hypertension     takes Amlodipine,Metoprolol,and Prinivil daily  . Dyspnea on exertion     with exertion  . GERD (gastroesophageal reflux disease)   . Cataract     bilateral  . Dialysis patient   . PUD (peptic ulcer disease) mid 1990's    with GI bleed.  endoscoped in mid 1990's at [facility]    Past Surgical History  Procedure Laterality Date  . Arteriovenous graft placement    . Av fistula placement  12/10/2000    Right brachiocephalic arteriovenous   . Hernia repair    . Aortagram  07/29/2011    Abdominal Aortagram  . Cardiac catheterization  08/01/11    Left heart catheterization  . Femoral-popliteal bypass graft  09/09/2011    Procedure: BYPASS GRAFT FEMORAL-POPLITEAL ARTERY;  Surgeon: [name], MD;  Location: [facility] OR;  Service: Vascular;  Laterality: Left;  . Amputation  09/12/2011     Procedure: AMPUTATION BELOW KNEE;  Surgeon: [name], MD;  Location: [facility] OR;  Service: Vascular;  Laterality: Left;  TRANSMETATARSAL  . Application of wound vac  10/30/2011    Procedure: APPLICATION OF WOUND VAC;  Surgeon: [name], MD;  Location: [facility] OR;  Service: Vascular;  Laterality: Left;  . Groin debridement  11/01/2011    Procedure: Drucie Ip DEBRIDEMENT;  Surgeon: Chuck Hint, MD;  Location: [facility] OR;  Service: Vascular;  Laterality: Left;  . Esophagogastroduodenoscopy  11/06/2011    Procedure: ESOPHAGOGASTRODUODENOSCOPY (EGD);  Surgeon: Beverley Fiedler, MD;  Location: [facility] ENDOSCOPY;  Service: Gastroenterology;  Laterality: N/A;  . Femoral-popliteal bypass graft  01/02/2012    Procedure: BYPASS GRAFT FEMORAL-POPLITEAL ARTERY;  Surgeon: [name], MD;  Location: [facility] OR;  Service: Vascular;  Laterality: Right;  . Amputation  01/02/2012    Procedure: AMPUTATION RAY;  Surgeon: [name], MD;  Location: [facility] OR;  Service: Vascular;  Laterality: Right;  . Toe amputation Right     5/13  . Exchange of a dialysis catheter N/A 07/15/2012    Procedure: EXCHANGE OF A DIALYSIS CATHETER;  Surgeon: Fransisco Hertz, MD;  Location: [facility] OR;  Service: Vascular;  Laterality: N/A;  . Av fistula placement Left 07/15/2012    Procedure: ARTERIOVENOUS (AV)  FISTULA CREATION;  Surgeon: Fransisco Hertz, MD;  Location: [facility] OR;  Service: Vascular;  Laterality: Left;    History   Social History  . Marital Status: Legally Separated    Spouse Name: N/A    Number of Children: N/A  . Years of Education: N/A   Occupational History  . DISABLED    Social History Main Topics  . Smoking status: Former Smoker -- 0.30 packs/day for 40 years    Types: Cigarettes    Quit date: 06/13/2012  . Smokeless tobacco: Never Used     Comment: pt was given 1-800-QUIT-NOW  . Alcohol Use: No  . Drug Use: No  . Sexually Active: Not Currently   Other Topics Concern  . Not on file   Social History Narrative   Lives  in [place].  His cousin helps him out and drives him to appts. He quit smoking earlier this year.    Family History  Problem Relation Age of Onset  . Breast cancer Mother   . Cancer Mother   . Cancer Father   . Anesthesia problems Neg Hx   . Hypotension Neg Hx   . Malignant hyperthermia Neg Hx   . Pseudochol deficiency Neg Hx     Current Outpatient Prescriptions on File Prior to Visit  Medication Sig Dispense Refill  . ALPRAZolam (XANAX) 1 MG tablet Take 1 tablet by mouth daily.      . calcium acetate (PHOSLO) 667 MG capsule Take 1,334-2,001 mg by mouth 3 (three) times daily with meals.       . cinacalcet (SENSIPAR) 60 MG tablet Take 60 mg by mouth daily.      Marland Kitchen FOSRENOL 1000 MG chewable tablet       . gabapentin (NEURONTIN) 100 MG capsule Take 100 mg by mouth daily.      . hydrALAZINE (APRESOLINE) 25 MG tablet Take 25 mg by mouth 3 (three) times daily.      . isosorbide mononitrate (IMDUR) 30 MG 24 hr tablet Take 30 mg by mouth daily.      Marland Kitchen levofloxacin (LEVAQUIN) 500 MG tablet Take 1 tablet (500 mg total) by mouth daily.  9 tablet  0  . multivitamin (RENA-VIT) TABS tablet Take 1 tablet by mouth daily.      Marland Kitchen oxyCODONE (ROXICODONE) 5 MG immediate release tablet Take 1 tablet (5 mg total) by mouth every 4 (four) hours as needed for pain.  30 tablet  0  . oxyCODONE-acetaminophen (PERCOCET/ROXICET) 5-325 MG per tablet Take 1 tablet by mouth every 4 (four) hours as needed for pain.  10 tablet  0  . sevelamer (RENVELA) 800 MG tablet Take 2 tablets (1,600 mg total) by mouth 3 (three) times daily with meals.  180 tablet  0  . simvastatin (ZOCOR) 5 MG tablet Take 5 mg by mouth at bedtime.      . traMADol (ULTRAM) 50 MG tablet Take 1 tablet (50 mg total) by mouth every 6 (six) hours as needed for pain.  30 tablet  0   No current facility-administered medications on file prior to visit.    Allergies  Allergen Reactions  . Ambien (Zolpidem Tartrate) Other (See Comments)    Hallucinations     Review of Systems (Positive items checked otherwise negative)  General: [ ]  Weight loss, [ ]  Weight gain, [ ]   Loss of appetite, [ ]  Fever  Neurologic: [ ]  Dizziness, [ ]  Blackouts, [ ]  Headaches, [ ]  Seizure  Ear/Nose/Throat: [ ]  Change in  eyesight, [ ]  Change in hearing, [ ]  Nose bleeds, [ ]  Sore throat  Vascular: [ ]  Pain in legs with walking, [ ]  Pain in feet while lying flat, [ ]  Non-healing ulcer, Stroke, [ ]  "Mini stroke", [ ]  Slurred speech, [ ]  Temporary blindness, [ ]  Blood clot in vein, [ ]  Phlebitis  Pulmonary: [ ]  Home oxygen, [ ]  Productive cough, [ ]  Bronchitis, [ ]  Coughing up blood,  [ ]  Asthma, [ ]  Wheezing  Musculoskeletal: [x]  Arthritis, [x]  Joint pain, [ ]  Muscle pain  Cardiac: [ ]  Chest pain, [ ]  Chest tightness/pressure, [ ]  Shortness of breath when lying flat, [ ]  Shortness of breath with exertion, [ ]  Palpitations, [ ]  Heart murmur, [ ]  Arrythmia,  [ ]  Atrial fibrillation  Hematologic: [ ]  Bleeding problems, [ ]  Clotting disorder, [ ]  Anemia  Psychiatric:  [ ]  Depression, [ ]  Anxiety, [ ]  Attention deficit disorder  Gastrointestinal:  [ ]  Black stool,[ ]   Blood in stool, [ ]  Peptic ulcer disease, [ ]  Reflux, [ ]  Hiatal hernia, [ ]  Trouble swallowing, [ ]  Diarrhea, [ ]  Constipation  Urinary:  [x]  Kidney disease, [ ]  Burning with urination, [ ]  Frequent urination, [ ]  Difficulty urinating  Skin: [ ]  Ulcers, [ ]  Rashes  Physical Examination  Filed Vitals:   08/18/12 1417  BP: 146/77  Pulse: 85  Resp: 20   GEN: WDWN  PULM: CTAB, sym exp  CV: RRR, Nl S1, S2  LUE: Incision is healed, fluc.fluid collection unchanged without any erythema, skin feels warm, hand grip is 5/5, sensation in digits is intact, palpable thrill, bruit can be auscultated , On Sonosite: fistula <4.5 mm, palpable radial pulse  L access duplex (Date: 08/18/12)  PSV 827 c/s in perianastomotic segment  Medical Decision Making  [name] is a 62 y.o. year old male who  presents s/p L 1st BRVT, BLE PAD, OA  There is no evidence of dilation of the brachial vein in this patient.  Along with the high velocities in the distal fistula,I suspect this fistula is unlikely to mature.  From the venogram in his arms, unfortunately, this was his only chance for a fistula.  I am scheduling the patient for a LUA AVG on 17 JUN 14.  If the fistula is mature at that time, I will transpose the fistula, otherwise, I will ligate the fistula and place a new LUA AVG.  I gave him some additional pain medication to help him sleep with his significant OA and PAD: Percocet 325/5 mg 1-2 PO q4-6h prn pain #30, RF #0.  Thank you for allowing [place] to participate in this patient's care.  Leonides Sake, MD [facility] [place] Office: [phone] Pager: [phone]

## 2012-09-07 NOTE — Progress Notes (Addendum)
Call to Dr. Gypsy Balsam , reported pt. Coughing & reports that in the past month he has  Had pneumonia, order rec'd to repeat CXR today. Pt. Reports that he has completed the course of Levaquin . Of note: pt. Reports that he has not had any meds since Sunday, because he had GI upset.

## 2012-09-07 NOTE — Anesthesia Postprocedure Evaluation (Signed)
  Anesthesia Post-op Note  Patient: [name]  Procedure(s) Performed: Procedure(s): INSERTION OF ARTERIOVENOUS (AV) GORE-TEX GRAFT ARM (Left) INSERTION OF a Femoral DIALYSIS CATHETER (Right) EXCISION Seroma left arm (Left)  Patient Location: PACU  Anesthesia Type:General  Level of Consciousness: awake and alert   Airway and Oxygen Therapy: Patient Spontanous Breathing  Post-op Pain: mild  Post-op Assessment: Post-op Vital signs reviewed, Patient's Cardiovascular Status Stable, Respiratory Function Stable, Patent Airway, No signs of Nausea or vomiting and Pain level controlled  Post-op Vital Signs: stable  Complications: No apparent anesthesia complications

## 2012-09-07 NOTE — Anesthesia Preprocedure Evaluation (Addendum)
Anesthesia Evaluation  Patient identified by MRN, date of birth, ID band Patient awake    Reviewed: Allergy & Precautions, H&P , NPO status , Patient's Chart, lab work & pertinent test results  Airway Mallampati: I TM Distance: >3 FB     Dental   Pulmonary  + rhonchi         Cardiovascular hypertension, + CAD, + Peripheral Vascular Disease and +CHF + dysrhythmias Atrial Fibrillation Rhythm:Irregular Rate:Normal     Neuro/Psych CVA    GI/Hepatic PUD, GERD-  ,  Endo/Other    Renal/GU DialysisRenal disease     Musculoskeletal   Abdominal   Peds  Hematology   Anesthesia Other Findings   Reproductive/Obstetrics                           Anesthesia Physical Anesthesia Plan  ASA: IV  Anesthesia Plan: General   Post-op Pain Management:    Induction: Intravenous  Airway Management Planned: LMA  Additional Equipment:   Intra-op Plan:   Post-operative Plan: Extubation in OR  Informed Consent: I have reviewed the patients History and Physical, chart, labs and discussed the procedure including the risks, benefits and alternatives for the proposed anesthesia with the patient or authorized representative who has indicated his/her understanding and acceptance.     Plan Discussed with: CRNA and Surgeon  Anesthesia Plan Comments:         Anesthesia Quick Evaluation

## 2012-09-07 NOTE — Anesthesia Procedure Notes (Signed)
Procedure Name: LMA Insertion Date/Time: 09/07/2012 8:26 AM Performed by: Marena Chancy Pre-anesthesia Checklist: Patient identified, Timeout performed, Emergency Drugs available, Suction available and Patient being monitored Patient Re-evaluated:Patient Re-evaluated prior to inductionOxygen Delivery Method: Circle system utilized Preoxygenation: Pre-oxygenation with 100% oxygen Intubation Type: IV induction LMA: LMA inserted LMA Size: 4.0 Number of attempts: 1 Placement Confirmation: breath sounds checked- equal and bilateral and positive ETCO2 Tube secured with: Tape Dental Injury: Teeth and Oropharynx as per pre-operative assessment

## 2012-09-07 NOTE — Progress Notes (Signed)
Tried different sites for pulse oximeter, O2 sats up to 96% then goes back down and won't read any.

## 2012-09-07 NOTE — Telephone Encounter (Addendum)
Message copied by Rosalyn Charters on Tue Sep 07, 2012  2:28 PM ------      Message from: Melene Plan      Created: Tue Sep 07, 2012 11:46 AM       I think Dr Imogene Burn may have already sent a msg on this pt??      ----- Message -----         From: Lars Mage, PA-C         Sent: 09/07/2012  11:23 AM           To: Melene Plan, RN            AV graft/diatek f/u 4-6 weeks with Dr. Imogene Burn  unable to reach patient by phone mailed appt. letter notifying patient of fu appt. on 10-22-12 2:30 with dr. Imogene Burn       ------

## 2012-09-07 NOTE — Progress Notes (Signed)
Pt. Will be taken from Xray to HOLDING by transporter

## 2012-09-07 NOTE — Interval H&P Note (Signed)
[facility] [place]  History and Physical Update  The patient was interviewed and re-examined.  The patient's previous History and Physical has been reviewed and is unchanged except for: interval loss of TDC.  The patient will undergo likely LUA AVG placement, ligation of L BRVT, and placement of TDC.  Risk, benefits, and alternatives to access surgery were discussed.  The patient is aware the risks include but are not limited to: bleeding, infection, steal syndrome, nerve damage, ischemic monomelic neuropathy, failure to mature, need for additional procedures, death and stroke.  The patient is aware the risks of tunneled dialysis catheter placement include but are not limited to: bleeding, infection, central venous injury, pneumothorax, possible venous stenosis, possible malpositioning in the venous system, and possible infections related to long-term catheter presence. The patient was aware of these risks and agreed to proceed.    Leonides Sake, MD [facility] [place] Office: [phone] Pager: [phone]  09/07/2012, 6:58 AM

## 2012-09-07 NOTE — Op Note (Signed)
OPERATIVE NOTE  PROCEDURE: 1. Left upper arm arteriovenous graft placement 2. right femoral vein tunneled dialysis catheter placement 3. right femoral vein cannulation under ultrasound guidance 4.  Excision and fulguration of seroma  PRE-OPERATIVE DIAGNOSIS: end-stage renal failure  POST-OPERATIVE DIAGNOSIS: same as above  SURGEON: Leonides Sake, MD  ANESTHESIA: general  ESTIMATED BLOOD LOSS: 30 cc  FINDING(S): 1.  Tips of the catheter in the right atrium on fluoroscopy  SPECIMEN(S):  none  INDICATIONS:   [name] is a 62 y.o. male who presents with end stage renal disease and failure to mature left brachial vein transposition.  The patient presents for right femoral tunneled dialysis catheter placement and placement of left upper arm arteriovenous graft and ligation of brachial vein transposition   The patient is aware the risks of tunneled dialysis catheter placement include but are not limited to: bleeding, infection, central venous injury, pneumothorax, possible venous stenosis, possible malpositioning in the venous system, and possible infections related to long-term catheter presence.  The patient was aware of these risks and agreed to proceed.  DESCRIPTION: After written full informed consent was obtained from the patient, the patient was taken back to the operating room.  Prior to induction, the patient was given IV antibiotics.  After obtaining adequate sedation, the patient was prepped and draped in the standard fashion for a femoral vein tunneled dialysis catheter placement.  Under ultrasound guidance, the right femoral vein was cannulated with the 18 gauge needle.  A J-wire was then placed into the right ventricle under fluroscopic guidance.  The wire was then secured in place with a clamp to the drapes.  I then made stab incisions at the cannulation and exit sites.  I dissected from the exit site to the cannulation site with a metal dissected and dilated the subcutaneous  tunnel with a plastic dilator.  The wire was then unclamped and I removed the needle.  The skin tract and venotomy was dilated serially with dilators.  Finally, the dilator-sheath was placed under fluroscopic guidance into the right iliac vein.  The dilator and wire were removed.  A 55 cm Diatek catheter was placed under fluoroscopic guidance into the right atrium.  The sheath was broken and peeled away while holding the catheter cuff at the level of the skin.  The back of the Diatek was connected to the metal dilator and delivered through the subcutaneous tunnel.  The back end of this catheter was transected, revealing the two lumens of this catheter.  The ports were docked onto these two lumens.  The catheter hub was then screwed into place.  Each port was tested by aspirating and flushing.  No resistance was noted.  Each port was then thoroughly flushed with heparinized saline.  The catheter was secured in placed with two interrupted stitches of 3-0 Nylon tied to the catheter.  The cannulation incision was closed with a U-stitch of 4-0 Monocryl.  The cannulation and exit incisions were cleaned and sterile bandages applied.  Each port was then loaded with concentrated heparin (1000 Units/mL) at the manufacturer recommended volumes to each port.  Sterile caps were applied to each port.  On completion fluoroscopy, the tips of the catheter were in the right atrium, and there was no evidence of pneumothorax.  The drapes were taken down and the patient was reprepped and redraped in standard fashion for a left arm access procedure.  I turned my attention first to the antecubitum.  Under ultrasound guidance, I identified the location of  the first stage brachial vein transposition and marked it on the skin.  I then examined the bicipital groove and identified the high brachial vein and marked it on the skin.  I injected a mixture of 1% lidocaine with epinephrine and 0.5% Marcaine without epinephrine at this location and  at the high bicipital groove to obtain some anesthesia.  In total, I used 30 mL of this mixture to obtain anesthesia at the antecubital incision, high bicipital incision, and also for the tunnel route.  I made an incision over the fistula and dissected down the brachial veins and brachial artery.  In this process, I encountered an organized seroma cavity which was under pressure.  I took down the seroma wall with electrocautery.  Residual patches of the seroma cavity were fulgurated with the electrocautery.  I dissected out the brachial vein anastomosed to the artery and clamped it.  There remained a residual thrill in the other vein.  I subsequently ligated both brachial veins near the level of the arterial anastomosis.  No further thrill was felt on exam.    I dissected out the brachial artery proximal to the prior anastomosis.  The artery was about 4 mm externally and somewhat calcified.  It was controlled proximally and distally with vessel loops and then I turned my attention to the high bicipital groove.  I made an incision at the previously anesthetized site, dissected down through the subcutaneous tissue and fascia until I reached the high brachial vein.  Externally, it appeared to be 6 mm in diameter.  I then dissected this vein proximally and distal.  I then I injected the mixture of local along the marked route for a subcutaneous tunnel.  I took a Company secretary and dissected from the antecubital up to the high bicipital incision.  Then I delivered the 4 x 7-mm stretch Gore-Tex graft, through this metal tunneler and then pulled out the metal tunneler leaving the graft in place.  The 4-mm end was left on the antecubital side and the 7 mm toward the axillary.  I then gave the patient 5000 units of heparin to gain some anticoagulation.  After waiting 3 minutes, I placed the brachial artery under tension proximally and distally with vessel loops, made an arteriotomy and extended it with a Potts  scissor.  The arterial control was marginal due to the calcification so I had to clamp both ends of the brachial artery.  I sewed the 4-mm end of the graft to this arteriotomy with a running stitch of 6-0 Prolene.  At this point, then I completed the anastomosis in the usual fashion.  I released the vessel loops on the inflow and allowed the artery to decompress through the graft. There was good pulsatile bleeding through this graft.  I clamped the graft near its arterial anastomosis and sucked out all the blood in the graft and loaded the graft with heparinized saline.  At this point, I pulled the graft to appropriate length and reset my exposure of the high brachial vein.  I tied off the high brachial vein distally with a 2-0 silk and then transected it.  There was good venous backbleeding from the vein.  Then, I injected some heparinized saline into this vein and then clamped it.  I then spatulated the vein to facilitate an end-to-end anastomosis.  I also spatulated the graft to facilitate an end-to-end anastomosis.  In the process of spatulating, I cut the graft to appropriate length for this anastomosis.  This  graft was sewn to the vein in an end-to-end configuration with a 5-0 Prolene.  Prior to completing this anastomosis, I allowed the vein to back bleed and then I also allowed the artery to bleed in an antegrade fashion.  I completed this anastomosis in the usual fashion, and then irrigated out the high bicipital exposure and then placed thrombin and Gelfoam.  I then turned my attention back to the antecubitum.  The distal radial pulse was dopplerable.  The venous outflow had a flow signature consistent with widely patent arterial venous graft.  At this point, I washed out the antecubital incision.  There was no more active bleeding.  The subcutaneous tissue was reapproximated with a double layer of a running stitch of 3-0 Vicryl.  The skin was then reapproximated with a running subcuticular 4-0 Monocryl.   The skin was then cleaned, dried, and Dermabond used to reinforce the skin closure.  We then turned our attention to the high bicipital exposure.  I removed all the thrombin and Gelfoam and washed out the wound.  There was no more active bleeding.  The subcutaneous tissue was repaired with a double layer of a running stitch of 3-0 Vicryl.  The skin was then reapproximated with running subcuticular 4-0 Monocryl.  The skin was then cleaned, dried, and then the skin closure was reinforced with Dermabond.     COMPLICATIONS: none  CONDITION: stable  Leonides Sake, MD [facility] [place] Office: [phone] Pager: [phone]  09/07/2012, 9:06 AM

## 2012-09-08 ENCOUNTER — Other Ambulatory Visit: Payer: Self-pay

## 2012-09-08 ENCOUNTER — Inpatient Hospital Stay (HOSPITAL_COMMUNITY)
Admission: EM | Admit: 2012-09-08 | Discharge: 2012-09-15 | DRG: 640 | Disposition: A | Discharge status: Home / Self Care | Payer: Medicare Other | Source: Other Acute Inpatient Hospital | Attending: Internal Medicine | Admitting: Internal Medicine

## 2012-09-08 ENCOUNTER — Encounter (HOSPITAL_COMMUNITY): Payer: Self-pay | Admitting: *Deleted

## 2012-09-08 DIAGNOSIS — J9601 Acute respiratory failure with hypoxia: Secondary | ICD-10-CM

## 2012-09-08 DIAGNOSIS — L89109 Pressure ulcer of unspecified part of back, unspecified stage: Secondary | ICD-10-CM | POA: Diagnosis present

## 2012-09-08 DIAGNOSIS — R0902 Hypoxemia: Secondary | ICD-10-CM

## 2012-09-08 DIAGNOSIS — E872 Acidosis, unspecified: Secondary | ICD-10-CM | POA: Diagnosis present

## 2012-09-08 DIAGNOSIS — J690 Pneumonitis due to inhalation of food and vomit: Secondary | ICD-10-CM

## 2012-09-08 DIAGNOSIS — L8990 Pressure ulcer of unspecified site, unspecified stage: Secondary | ICD-10-CM | POA: Diagnosis present

## 2012-09-08 DIAGNOSIS — K219 Gastro-esophageal reflux disease without esophagitis: Secondary | ICD-10-CM | POA: Diagnosis present

## 2012-09-08 DIAGNOSIS — I4891 Unspecified atrial fibrillation: Secondary | ICD-10-CM

## 2012-09-08 DIAGNOSIS — I509 Heart failure, unspecified: Secondary | ICD-10-CM | POA: Diagnosis present

## 2012-09-08 DIAGNOSIS — E162 Hypoglycemia, unspecified: Secondary | ICD-10-CM

## 2012-09-08 DIAGNOSIS — G9341 Metabolic encephalopathy: Secondary | ICD-10-CM

## 2012-09-08 DIAGNOSIS — I214 Non-ST elevation (NSTEMI) myocardial infarction: Secondary | ICD-10-CM

## 2012-09-08 DIAGNOSIS — Z87891 Personal history of nicotine dependence: Secondary | ICD-10-CM

## 2012-09-08 DIAGNOSIS — Y95 Nosocomial condition: Secondary | ICD-10-CM

## 2012-09-08 DIAGNOSIS — I2510 Atherosclerotic heart disease of native coronary artery without angina pectoris: Secondary | ICD-10-CM

## 2012-09-08 DIAGNOSIS — Z992 Dependence on renal dialysis: Secondary | ICD-10-CM

## 2012-09-08 DIAGNOSIS — E785 Hyperlipidemia, unspecified: Secondary | ICD-10-CM | POA: Diagnosis present

## 2012-09-08 DIAGNOSIS — J90 Pleural effusion, not elsewhere classified: Secondary | ICD-10-CM

## 2012-09-08 DIAGNOSIS — E875 Hyperkalemia: Secondary | ICD-10-CM | POA: Diagnosis present

## 2012-09-08 DIAGNOSIS — M129 Arthropathy, unspecified: Secondary | ICD-10-CM | POA: Diagnosis present

## 2012-09-08 DIAGNOSIS — R7989 Other specified abnormal findings of blood chemistry: Secondary | ICD-10-CM

## 2012-09-08 DIAGNOSIS — J189 Pneumonia, unspecified organism: Secondary | ICD-10-CM

## 2012-09-08 DIAGNOSIS — S88119A Complete traumatic amputation at level between knee and ankle, unspecified lower leg, initial encounter: Secondary | ICD-10-CM

## 2012-09-08 DIAGNOSIS — J9600 Acute respiratory failure, unspecified whether with hypoxia or hypercapnia: Secondary | ICD-10-CM | POA: Diagnosis not present

## 2012-09-08 DIAGNOSIS — J9602 Acute respiratory failure with hypercapnia: Secondary | ICD-10-CM

## 2012-09-08 DIAGNOSIS — D631 Anemia in chronic kidney disease: Secondary | ICD-10-CM | POA: Diagnosis present

## 2012-09-08 DIAGNOSIS — J9383 Other pneumothorax: Secondary | ICD-10-CM | POA: Diagnosis not present

## 2012-09-08 DIAGNOSIS — G589 Mononeuropathy, unspecified: Secondary | ICD-10-CM | POA: Diagnosis present

## 2012-09-08 DIAGNOSIS — I5032 Chronic diastolic (congestive) heart failure: Secondary | ICD-10-CM

## 2012-09-08 DIAGNOSIS — M6281 Muscle weakness (generalized): Secondary | ICD-10-CM

## 2012-09-08 DIAGNOSIS — G9340 Encephalopathy, unspecified: Secondary | ICD-10-CM

## 2012-09-08 DIAGNOSIS — I70269 Atherosclerosis of native arteries of extremities with gangrene, unspecified extremity: Secondary | ICD-10-CM

## 2012-09-08 DIAGNOSIS — Z8673 Personal history of transient ischemic attack (TIA), and cerebral infarction without residual deficits: Secondary | ICD-10-CM

## 2012-09-08 DIAGNOSIS — N186 End stage renal disease: Secondary | ICD-10-CM

## 2012-09-08 DIAGNOSIS — I739 Peripheral vascular disease, unspecified: Secondary | ICD-10-CM | POA: Diagnosis present

## 2012-09-08 DIAGNOSIS — I120 Hypertensive chronic kidney disease with stage 5 chronic kidney disease or end stage renal disease: Secondary | ICD-10-CM | POA: Diagnosis present

## 2012-09-08 DIAGNOSIS — I959 Hypotension, unspecified: Secondary | ICD-10-CM

## 2012-09-08 LAB — RENAL FUNCTION PANEL
Albumin: 2.4 g/dL — ABNORMAL LOW (ref 3.5–5.2)
Chloride: 96 mEq/L (ref 96–112)
Creatinine, Ser: 14.25 mg/dL — ABNORMAL HIGH (ref 0.50–1.35)
GFR calc non Af Amer: 3 mL/min — ABNORMAL LOW (ref >90)

## 2012-09-08 LAB — MRSA PCR SCREENING: MRSA by PCR: NEGATIVE

## 2012-09-08 MED ORDER — VANCOMYCIN HCL 10 G IV SOLR
1500.0000 mg | Freq: Once | INTRAVENOUS | Status: DC
  Filled 2012-09-08: qty 1500

## 2012-09-08 MED ORDER — SODIUM CHLORIDE 0.9 % IV SOLN: 100.0000 mL | INTRAVENOUS | Status: DC | PRN

## 2012-09-08 MED ORDER — DEXTROSE 50 % IV SOLN
INTRAVENOUS | Status: AC
  Filled 2012-09-08: qty 50

## 2012-09-08 MED ORDER — DEXTROSE 50 % IV SOLN
25.0000 mL | Freq: Once | INTRAVENOUS | Status: AC
  Administered 2012-09-08: 25 mL via INTRAVENOUS

## 2012-09-08 MED ORDER — HEPARIN BOLUS VIA INFUSION
3000.0000 [IU] | Freq: Once | INTRAVENOUS | Status: AC
  Administered 2012-09-08: 3000 [IU] via INTRAVENOUS
  Filled 2012-09-08: qty 3000

## 2012-09-08 MED ORDER — PIPERACILLIN-TAZOBACTAM IN DEX 2-0.25 GM/50ML IV SOLN
2.2500 g | Freq: Three times a day (TID) | INTRAVENOUS | Status: DC
  Administered 2012-09-09 – 2012-09-12 (×10): 2.25 g via INTRAVENOUS
  Filled 2012-09-08 (×12): qty 50

## 2012-09-08 MED ORDER — HEPARIN SODIUM (PORCINE) 1000 UNIT/ML DIALYSIS
3500.0000 [IU] | INTRAMUSCULAR | Status: DC | PRN
  Administered 2012-09-08: 3500 [IU] via INTRAVENOUS_CENTRAL

## 2012-09-08 MED ORDER — INSULIN ASPART 100 UNIT/ML ~~LOC~~ SOLN
0.0000 [IU] | SUBCUTANEOUS | Status: DC
  Administered 2012-09-09: 2 [IU] via SUBCUTANEOUS

## 2012-09-08 MED ORDER — BIOTENE DRY MOUTH MT LIQD
15.0000 mL | Freq: Two times a day (BID) | OROMUCOSAL | Status: DC
  Administered 2012-09-09 – 2012-09-15 (×10): 15 mL via OROMUCOSAL

## 2012-09-08 MED ORDER — LIDOCAINE HCL (PF) 1 % IJ SOLN: 5.0000 mL | INTRAMUSCULAR | Status: DC | PRN

## 2012-09-08 MED ORDER — CHLORHEXIDINE GLUCONATE 0.12 % MT SOLN
15.0000 mL | Freq: Two times a day (BID) | OROMUCOSAL | Status: DC
  Administered 2012-09-08 – 2012-09-10 (×5): 15 mL via OROMUCOSAL
  Filled 2012-09-08 (×5): qty 15

## 2012-09-08 MED ORDER — NEPRO/CARBSTEADY PO LIQD
237.0000 mL | ORAL | Status: DC | PRN
  Filled 2012-09-08: qty 237

## 2012-09-08 MED ORDER — ATORVASTATIN CALCIUM 80 MG PO TABS
80.0000 mg | ORAL_TABLET | Freq: Every day | ORAL | Status: DC
  Administered 2012-09-10 – 2012-09-14 (×5): 80 mg via ORAL
  Filled 2012-09-08 (×7): qty 1

## 2012-09-08 MED ORDER — HEPARIN SODIUM (PORCINE) 1000 UNIT/ML DIALYSIS
1000.0000 [IU] | INTRAMUSCULAR | Status: DC | PRN
  Administered 2012-09-09: 6600 [IU] via INTRAVENOUS_CENTRAL

## 2012-09-08 MED ORDER — PENTAFLUOROPROP-TETRAFLUOROETH EX AERO: 1.0000 "application " | INHALATION_SPRAY | CUTANEOUS | Status: DC | PRN

## 2012-09-08 MED ORDER — HEPARIN SODIUM (PORCINE) 5000 UNIT/ML IJ SOLN
5000.0000 [IU] | Freq: Three times a day (TID) | INTRAMUSCULAR | Status: DC
  Administered 2012-09-08: 5000 [IU] via SUBCUTANEOUS
  Filled 2012-09-08 (×2): qty 1

## 2012-09-08 MED ORDER — ASPIRIN 81 MG PO CHEW
81.0000 mg | CHEWABLE_TABLET | Freq: Every day | ORAL | Status: DC
  Administered 2012-09-10 – 2012-09-15 (×6): 81 mg via ORAL
  Filled 2012-09-08 (×7): qty 1

## 2012-09-08 MED ORDER — DEXTROSE-NACL 5-0.9 % IV SOLN
INTRAVENOUS | Status: DC
  Administered 2012-09-08: 22:00:00 via INTRAVENOUS

## 2012-09-08 MED ORDER — ALTEPLASE 2 MG IJ SOLR
2.0000 mg | Freq: Once | INTRAMUSCULAR | Status: AC | PRN
  Filled 2012-09-08: qty 2

## 2012-09-08 MED ORDER — PANTOPRAZOLE SODIUM 40 MG IV SOLR
40.0000 mg | INTRAVENOUS | Status: DC
  Administered 2012-09-09 – 2012-09-11 (×4): 40 mg via INTRAVENOUS
  Filled 2012-09-08 (×5): qty 40

## 2012-09-08 MED ORDER — SODIUM CHLORIDE 0.9 % IV SOLN
INTRAVENOUS | Status: DC
  Administered 2012-09-08: 20:00:00 via INTRAVENOUS

## 2012-09-08 MED ORDER — HEPARIN (PORCINE) IN NACL 100-0.45 UNIT/ML-% IJ SOLN
900.0000 [IU]/h | INTRAMUSCULAR | Status: DC
  Administered 2012-09-09: 900 [IU]/h via INTRAVENOUS
  Filled 2012-09-08 (×2): qty 250

## 2012-09-08 MED ORDER — LIDOCAINE-PRILOCAINE 2.5-2.5 % EX CREA
1.0000 "application " | TOPICAL_CREAM | CUTANEOUS | Status: DC | PRN
  Filled 2012-09-08: qty 5

## 2012-09-08 MED ORDER — VANCOMYCIN HCL 10 G IV SOLR
1500.0000 mg | Freq: Once | INTRAVENOUS | Status: AC
  Administered 2012-09-09: 1500 mg via INTRAVENOUS

## 2012-09-08 MED FILL — Mupirocin Oint 2%: CUTANEOUS | Qty: 22 | Status: AC

## 2012-09-08 NOTE — H&P (Signed)
PULMONARY  / CRITICAL CARE MEDICINE  Name: MACAULAY REICHER MRN: [id number] DOB: 1950-03-27    ADMISSION DATE:  09/08/2012  REFERRING MD :  ER  CHIEF COMPLAINT: Change in mental status  BRIEF PATIENT DESCRIPTION:  62 yo male brought to ED by family with change in mental status.  Noted to have fever, and hyperkalemia.  Transferred to [facility] for emergent HD.  SIGNIFICANT EVENTS:  STUDIES:  6/18 CT head [facility]) >> microvascular ischemic changes, old Rt parietal and Lt cerebellar infarcts  LINES / TUBES: Rt femoral HD cath >>  CULTURES: Blood 6/18 >>  ANTIBIOTICS: Levaquin 6/18 >> 6/18 Vancomycin 6/18 >>  Zosyn 6/18 >>   HISTORY OF PRESENT ILLNESS:   He was going to HD.  He was confused and sleepy.  He was brought to ER at [facility].  Labs there showed BUN 99, Creatinine 14.8, Potassium 6.7, WBC 7000.  He had temp 36.9.  ABG was 7.33/49/68 on 3 liters.  UDS was negative.  Lactic acid was 1.9.  Troponin was 3.92.  He was given levaquin.  CT was negative for acute findings, and CXR showed Rt basilar infiltrate with effusion.  He was noted to have sacral decubitus ulcer.  He was given therapy for his hyperkalemia, including kayexalate.  He was transferred to [facility] for emergent dialysis.  PAST MEDICAL HISTORY :  Past Medical History  Diagnosis Date  . Hyperlipidemia   . ESRD (end stage renal disease)     a. MWF dialysis in [place] (followed by Dr. Kristian Covey)  . Arthritis   . Claudication   . Stroke 2005  . Anemia   . Chronic diastolic CHF (congestive heart failure) 07/2010    a. 07/2008 Echo EF 50-55%, mild-mod LVH, Gr 1 DD.  Marland Kitchen Peripheral vascular occlusive disease     a. 07/2011 Periph Angio: No signif Ao-illiac dzs, LCF stenosis, 100% LSFA w recon above knee pop and 3 vessel runoff, 100% RSFA w/ recon above knee --> pending L Fem to below Knee pop bypass.  Marland Kitchen History of tobacco abuse   . Hypertension     takes Amlodipine,Metoprolol,and Prinivil daily  . Dyspnea on exertion     with  exertion  . GERD (gastroesophageal reflux disease)   . Cataract     bilateral  . Dialysis patient   . PUD (peptic ulcer disease) mid 1990's    with GI bleed.  endoscoped in mid 1990's at [facility]   Past Surgical History  Procedure Laterality Date  . Arteriovenous graft placement    . Av fistula placement  12/10/2000    Right brachiocephalic arteriovenous   . Hernia repair    . Aortagram  07/29/2011    Abdominal Aortagram  . Cardiac catheterization  08/01/11    Left heart catheterization  . Femoral-popliteal bypass graft  09/09/2011    Procedure: BYPASS GRAFT FEMORAL-POPLITEAL ARTERY;  Surgeon: [name], MD;  Location: [facility] OR;  Service: Vascular;  Laterality: Left;  . Amputation  09/12/2011    Procedure: AMPUTATION BELOW KNEE;  Surgeon: [name], MD;  Location: [facility] OR;  Service: Vascular;  Laterality: Left;  TRANSMETATARSAL  . Application of wound vac  10/30/2011    Procedure: APPLICATION OF WOUND VAC;  Surgeon: [name], MD;  Location: [facility] OR;  Service: Vascular;  Laterality: Left;  . Groin debridement  11/01/2011    Procedure: Drucie Ip DEBRIDEMENT;  Surgeon: Chuck Hint, MD;  Location: [facility] OR;  Service: Vascular;  Laterality: Left;  .  Esophagogastroduodenoscopy  11/06/2011    Procedure: ESOPHAGOGASTRODUODENOSCOPY (EGD);  Surgeon: Beverley Fiedler, MD;  Location: [facility] ENDOSCOPY;  Service: Gastroenterology;  Laterality: N/A;  . Femoral-popliteal bypass graft  01/02/2012    Procedure: BYPASS GRAFT FEMORAL-POPLITEAL ARTERY;  Surgeon: [name], MD;  Location: [facility] OR;  Service: Vascular;  Laterality: Right;  . Amputation  01/02/2012    Procedure: AMPUTATION RAY;  Surgeon: [name], MD;  Location: [facility] OR;  Service: Vascular;  Laterality: Right;  . Toe amputation Right     5/13  . Exchange of a dialysis catheter N/A 07/15/2012    Procedure: EXCHANGE OF A DIALYSIS CATHETER;  Surgeon: Fransisco Hertz, MD;  Location: [facility] OR;  Service: Vascular;  Laterality: N/A;  .  Av fistula placement Left 07/15/2012    Procedure: ARTERIOVENOUS (AV) FISTULA CREATION;  Surgeon: Fransisco Hertz, MD;  Location: [facility] OR;  Service: Vascular;  Laterality: Left;   Prior to Admission medications   Medication Sig Start Date End Date Taking? Authorizing Provider  ALPRAZolam Prudy Feeler) 1 MG tablet Take 1 tablet by mouth daily. 04/13/12   Historical Provider, MD  calcium acetate (PHOSLO) 667 MG capsule Take 1,334-2,001 mg by mouth 3 (three) times daily with meals.     Historical Provider, MD  cinacalcet (SENSIPAR) 60 MG tablet Take 60 mg by mouth daily.    Historical Provider, MD  FOSRENOL 1000 MG chewable tablet Chew 1,000 mg by mouth 2 (two) times daily with a meal.  05/24/12   Historical Provider, MD  gabapentin (NEURONTIN) 100 MG capsule Take 100 mg by mouth daily.    Historical Provider, MD  hydrALAZINE (APRESOLINE) 25 MG tablet Take 25 mg by mouth 3 (three) times daily.    Historical Provider, MD  isosorbide mononitrate (IMDUR) 30 MG 24 hr tablet Take 30 mg by mouth daily.    Historical Provider, MD  levofloxacin (LEVAQUIN) 500 MG tablet Take 1 tablet (500 mg total) by mouth daily. 08/06/12   [name], MD  multivitamin (RENA-VIT) TABS tablet Take 1 tablet by mouth daily.    Historical Provider, MD  naproxen sodium (ALEVE) 220 MG tablet Take 440 mg by mouth daily as needed (For headache.).    Historical Provider, MD  oxyCODONE-acetaminophen (PERCOCET/ROXICET) 5-325 MG per tablet Take 1 tablet by mouth every 6 (six) hours as needed for pain. 09/07/12   Lars Mage, PA-C  sevelamer (RENVELA) 800 MG tablet Take 2 tablets (1,600 mg total) by mouth 3 (three) times daily with meals. 11/07/11 11/06/12  Renae Fickle, MD  simvastatin (ZOCOR) 5 MG tablet Take 5 mg by mouth at bedtime.    Historical Provider, MD   Allergies  Allergen Reactions  . Ambien (Zolpidem Tartrate) Other (See Comments)    Hallucinations    FAMILY HISTORY:  Family History  Problem Relation Age of Onset  . Breast  cancer Mother   . Cancer Mother   . Cancer Father   . Anesthesia problems Neg Hx   . Hypotension Neg Hx   . Malignant hyperthermia Neg Hx   . Pseudochol deficiency Neg Hx    SOCIAL HISTORY:  reports that he quit smoking about 2 months ago. His smoking use included Cigarettes. He has a 12 pack-year smoking history. He has never used smokeless tobacco. He reports that he does not drink alcohol or use illicit drugs.  REVIEW OF SYSTEMS:  Denies chest pain.  C/o cough and chest congestion.  Denies abdominal pain.  SUBJECTIVE:   VITAL SIGNS:  Temp:  [99.2 F (37.3 C)] 99.2 F (37.3 C) (06/18 1750) Pulse Rate:  [71] 71 (06/18 1750) Resp:  [10] 10 (06/18 1750) BP: (82)/(51) 82/51 mmHg (06/18 1750) SpO2:  [97 %] 97 % (06/18 1750) Weight:  [166 lb 10.7 oz (75.6 kg)] 166 lb 10.7 oz (75.6 kg) (06/18 1750) HEMODYNAMICS:   VENTILATOR SETTINGS:   INTAKE / OUTPUT: Intake/Output   None     PHYSICAL EXAMINATION: General: Ill appearing Neuro: Somnolent, easily wakes up, follows commands, mild tremor HEENT: Pupils reactive, dry oral mucosa, poor dentition Cardiovascular: regular, no murmur Lungs:  Decreased breath sounds, scattered rhonchi, absent breath sounds Rt base Abdomen:  Soft, decreased bowel sounds, non tender Musculoskeletal:  Lt and Rt TMA, no edema, AV graft Lt arm, HD cath Rt femoral region Skin: dry gangrene Rt foot  LABS:  Recent Labs Lab 09/07/12 0704 09/07/12 0719  HGB 13.3 12.9*  NA 135 135  K 6.2* 5.1  GLUCOSE 86 98   No results found for this basename: GLUCAP,  in the last 168 hours  CXR: From [place] 6/18 >> Increased Rt effusion and infiltrate.  ASSESSMENT / PLAN:  PULMONARY A: Hypoxemia with progressive chronic Rt effusion and infiltrate.  Likely also has some degree of hypoventilation. Thoracentesis from 08/15/11 >> protein 3.2, LDH 155, WBC 214, reactive mesothelial cells. P:   -supplemental oxygen to keep SpO2 > 92% -f/u CXR >> may need repeat  pleural fluid sampling -BiPAP prn  CARDIOVASCULAR A: Elevated cardiac enzymes >> uncertain significance in setting of renal failure. Hx of PVD, HTN, Diastolic heart failure. Hypotension >> ? How accurate BP readings are with cuff on leg. P:  -even fluid balance -may need aline to more accurately assess blood pressure -f/u ECG, cardiac enzymes  RENAL A:  ESRD. Hyperkalemia. P:   -HD per renal  GASTROINTESTINAL A:  Nutrition. Hx of PUD. P:   -NPO until mental status improved -Protonix for SUP  HEMATOLOGIC A:  No acute issues. P:  -f/u CBC -SQ heparin for DVT prevention  INFECTIOUS A:  Fever, progressive Rt infiltrate/effusion, sacral decubitus ulcer. P:   -D1 vancomycin, zosyn -check blood cultures -wound care consult  ENDOCRINE A: Hyperglycemia.   P:   -SSI  NEUROLOGIC A:  Acute encephalopathy likely 2nd to uremia. Hx of CVA. P:   -monitor mental status while correcting metabolic derangements  Updated family at bedside.  Discussed plan with Dr. Arlean Hopping.  CC time 50 minutes.  Coralyn Helling, MD [facility] Pulmonary/Critical Care 09/08/2012, 6:59 PM Pager:  [phone] After 3pm call: [phone]

## 2012-09-08 NOTE — Progress Notes (Signed)
ANTICOAGULATION CONSULT NOTE - Initial Consult  Pharmacy Consult for Heparin Indication: chest pain/ACS  Allergies  Allergen Reactions  . Ambien (Zolpidem Tartrate) Other (See Comments)    Hallucinations    Patient Measurements: Height: 5\' 6"  (167.6 cm) Weight: 166 lb 10.7 oz (75.6 kg) IBW/kg (Calculated) : 63.8  Vital Signs: Temp: 98.7 F (37.1 C) (06/18 1930) Temp src: Oral (06/18 1930) BP: 115/58 mmHg (06/18 2245) Pulse Rate: 80 (06/18 2245)  Labs:  Recent Labs  09/07/12 0704 09/07/12 0719 09/08/12 1930  HGB 13.3 12.9*  --   HCT 39.0 38.0*  --   CREATININE  --   --  14.25*  TROPONINI  --   --  3.16*    Estimated Creatinine Clearance: 4.8 ml/min (by C-G formula based on Cr of 14.25).   Medical History: Past Medical History  Diagnosis Date  . Hyperlipidemia   . ESRD (end stage renal disease)     a. MWF dialysis in [place] (followed by Dr. Kristian Covey)  . Arthritis   . Claudication   . Stroke 2005  . Anemia   . Chronic diastolic CHF (congestive heart failure) 07/2010    a. 07/2008 Echo EF 50-55%, mild-mod LVH, Gr 1 DD.  Marland Kitchen Peripheral vascular occlusive disease     a. 07/2011 Periph Angio: No signif Ao-illiac dzs, LCF stenosis, 100% LSFA w recon above knee pop and 3 vessel runoff, 100% RSFA w/ recon above knee --> pending L Fem to below Knee pop bypass.  Marland Kitchen History of tobacco abuse   . Hypertension     takes Amlodipine,Metoprolol,and Prinivil daily  . Dyspnea on exertion     with exertion  . GERD (gastroesophageal reflux disease)   . Cataract     bilateral  . Dialysis patient   . PUD (peptic ulcer disease) mid 1990's    with GI bleed.  endoscoped in mid 1990's at [facility]    Medications:  Prescriptions prior to admission  Medication Sig Dispense Refill  . ALPRAZolam (XANAX) 1 MG tablet Take 1 tablet by mouth daily.      . calcium acetate (PHOSLO) 667 MG capsule Take 1,334-2,001 mg by mouth 3 (three) times daily with meals.       . cinacalcet  (SENSIPAR) 60 MG tablet Take 60 mg by mouth daily.      Marland Kitchen FOSRENOL 1000 MG chewable tablet Chew 1,000 mg by mouth 2 (two) times daily with a meal.       . gabapentin (NEURONTIN) 100 MG capsule Take 100 mg by mouth daily.      . hydrALAZINE (APRESOLINE) 25 MG tablet Take 25 mg by mouth 3 (three) times daily.      . isosorbide mononitrate (IMDUR) 30 MG 24 hr tablet Take 30 mg by mouth daily.      . multivitamin (RENA-VIT) TABS tablet Take 1 tablet by mouth daily.      . naproxen sodium (ALEVE) 220 MG tablet Take 440 mg by mouth daily as needed (For headache.).      Marland Kitchen oxyCODONE-acetaminophen (PERCOCET/ROXICET) 5-325 MG per tablet Take 1 tablet by mouth every 6 (six) hours as needed for pain.  30 tablet  0  . sevelamer (RENVELA) 800 MG tablet Take 2 tablets (1,600 mg total) by mouth 3 (three) times daily with meals.  180 tablet  0  . simvastatin (ZOCOR) 5 MG tablet Take 5 mg by mouth at bedtime.      . traMADol (ULTRAM) 50 MG tablet Take 50 mg by mouth  every 6 (six) hours as needed for pain. For pain        Assessment: 62 yo male with elevated cardiac markers for heparin   Goal of Therapy:  Heparin level 0.3-0.7 units/ml Monitor platelets by anticoagulation protocol: Yes   Plan:  Heparin 3000 units IV bolus, then 900 units/hr Check heparin level in 8 hours.  [name] 09/08/2012,11:06 PM

## 2012-09-08 NOTE — Progress Notes (Signed)
ANTIBIOTIC CONSULT NOTE - INITIAL  Pharmacy Consult for Vancomycin/Zosyn Indication:  Empiric for AMS with sepsis picture  Allergies  Allergen Reactions  . Ambien (Zolpidem Tartrate) Other (See Comments)    Hallucinations   Patient Measurements: Height: 5\' 6"  (167.6 cm) Weight: 166 lb 10.7 oz (75.6 kg) IBW/kg (Calculated) : 63.8  Vital Signs: Temp: 99.2 F (37.3 C) (06/18 1750) Temp src: Oral (06/18 1750) BP: 75/40 mmHg (06/18 1815) Pulse Rate: 79 (06/18 1815)  Labs:  Recent Labs  09/07/12 0704 09/07/12 0719  HGB 13.3 12.9*   Estimated Creatinine Clearance: 7.4 ml/min (by C-G formula based on Cr of 9.21).  Microbiology: Recent Results (from the past 720 hour(s))  SURGICAL PCR SCREEN     Status: None   Collection Time    09/07/12  6:58 AM      Result Value Range Status   MRSA, PCR NEGATIVE  NEGATIVE Final   Staphylococcus aureus NEGATIVE  NEGATIVE Final   Comment:            The Xpert SA Assay (FDA     approved for NASAL specimens     in patients over 45 years of age),     is one component of     a comprehensive surveillance     program.  Test performance has     been validated by [employer] for patients greater     than or equal to 32 year old.     It is not intended     to diagnose infection nor to     guide or monitor treatment.   Medical History: Past Medical History  Diagnosis Date  . Hyperlipidemia   . ESRD (end stage renal disease)     a. MWF dialysis in [place] (followed by Dr. Kristian Covey)  . Arthritis   . Claudication   . Stroke 2005  . Anemia   . Chronic diastolic CHF (congestive heart failure) 07/2010    a. 07/2008 Echo EF 50-55%, mild-mod LVH, Gr 1 DD.  Marland Kitchen Peripheral vascular occlusive disease     a. 07/2011 Periph Angio: No signif Ao-illiac dzs, LCF stenosis, 100% LSFA w recon above knee pop and 3 vessel runoff, 100% RSFA w/ recon above knee --> pending L Fem to below Knee pop bypass.  Marland Kitchen History of tobacco abuse   . Hypertension    takes Amlodipine,Metoprolol,and Prinivil daily  . Dyspnea on exertion     with exertion  . GERD (gastroesophageal reflux disease)   . Cataract     bilateral  . Dialysis patient   . PUD (peptic ulcer disease) mid 1990's    with GI bleed.  endoscoped in mid 1990's at [facility]   Medications:  Anti-infectives   None     Assessment: 62 yo male admitted with AMS, hypotension and suspected PNA.  He has a past medical history of ESRD with ongoing HD.  His T-max is 99.2, BP of 79/42.  His CXR reveals a pleural effusion and he is currently on 3L [place].  We have been asked to initiate a broad spectrum regimen of IV antibiotics until his Sepsis/PNA work-up is completed.  We will use TBW for dosing but adjust for his renal failure.  His last HD was Friday 6/13, he has since received placement of a new HD cath here yesterday and received one dose of IV Cefuroxime 1.5gm pre-op.  Goal of Therapy:  Vancomycin trough level 15-20 mcg/ml Therapeutic response to IV antibiotics  Plan:  1.  Will give one dose of IV Vancomycin 1500 mg and f/u his HD schedule. 2.  Will begin IV Zosyn at 2.25gm every 8 hours  3.  Monitor micro-data, renal function and s/s of infection  Nadara Mustard, PharmD., MS Clinical Pharmacist Pager:  [phone] Thank you for allowing pharmacy to be part of this patients care team. 09/08/2012,7:17 PM

## 2012-09-08 NOTE — Consult Note (Signed)
CARDIOLOGY CONSULT NOTE  Patient ID: [name] MRN: [id number] DOB/AGE: 11-02-1950 62 y.o.  Admit date: 09/08/2012  Primary Cardiologist: None Reason for Consultation: NSTEMI  HPI: 62 yo with history of PAD, CAD, ESRD, paroxysmal atrial fibrillation/flutter was transferred down from [facility].  Patient signed out early from HD on Friday then missed HD on Monday. Yesterday, he had an access procedure with ligation of nonmaturing LUA BVT, placement new LUA AV graft and placement of new R femoral tunneled HD cath.  At HD today in [place], he was noted to be barely arousable/very somnolent.  SBP as low as 80s.  He was sent to the ER where he was found to be hyperkalemic with BUN 101.  CXR showed right basilar infiltrate (had been seen on prior films).  He remained confused in the ER.  TnI was elevated to 3.92.  He was transferred to [facility] for management of uremia, confusion, hypotension.    Here, SBP has been in the 80s-100s.  Currently in the 100s.  He is getting hemodialysis currently.  He is still confused but awake.  He is unable to tell me where he is.  He denies any pain or dyspnea but as noted, quite confused.  No family is here.  ECG is similar to the past but inferolateral T wave inversions are somewhat deeper.  He is known to have a subtotally occluded RCA from cath in 5/13 (medical management).  He has paroxysmal atrial fibrillation/flutter but is in NSR today.  He has not been on coumadin.   Review of systems complete and found to be negative unless listed above in HPI  Past Medical History: 1. ESRD 2. HTN 3. PAD: s/p bilateral fem-pop bypasses in 2013.  4. H/o CVA 5. H/o atrial fibrillation/flutter, not on coumadin.  6. Chronic diastolic CHF: Echo (5/13) with EF 50%, inferior hypokinesis.  7. Hyperlipidemia 8. HTN 9. CAD: LHC (5/13) with 50% dLM, 50% ostial LAD, subtotal occlusion of RCA with EF 50%.  Medical treatment.   Family History  Problem Relation Age  of Onset  . Breast cancer Mother   . Cancer Mother   . Cancer Father   . Anesthesia problems Neg Hx   . Hypotension Neg Hx   . Malignant hyperthermia Neg Hx   . Pseudochol deficiency Neg Hx     History   Social History  . Marital Status: Legally Separated    Spouse Name: N/A    Number of Children: N/A  . Years of Education: N/A   Occupational History  . DISABLED    Social History Main Topics  . Smoking status: Former Smoker -- 0.30 packs/day for 40 years    Types: Cigarettes    Quit date: 06/13/2012  . Smokeless tobacco: Never Used     Comment: pt was given 1-800-QUIT-NOW  . Alcohol Use: No  . Drug Use: No  . Sexually Active: Not Currently   Other Topics Concern  . Not on file   Social History Narrative   Lives in [place].  His cousin helps him out and drives him to appts. He quit smoking earlier this year.     Prescriptions prior to admission  Medication Sig Dispense Refill  . ALPRAZolam (XANAX) 1 MG tablet Take 1 tablet by mouth daily.      . calcium acetate (PHOSLO) 667 MG capsule Take 1,334-2,001 mg by mouth 3 (three) times daily with meals.       . cinacalcet (SENSIPAR) 60 MG tablet  Take 60 mg by mouth daily.      Marland Kitchen FOSRENOL 1000 MG chewable tablet Chew 1,000 mg by mouth 2 (two) times daily with a meal.       . gabapentin (NEURONTIN) 100 MG capsule Take 100 mg by mouth daily.      . hydrALAZINE (APRESOLINE) 25 MG tablet Take 25 mg by mouth 3 (three) times daily.      . isosorbide mononitrate (IMDUR) 30 MG 24 hr tablet Take 30 mg by mouth daily.      . multivitamin (RENA-VIT) TABS tablet Take 1 tablet by mouth daily.      . naproxen sodium (ALEVE) 220 MG tablet Take 440 mg by mouth daily as needed (For headache.).      Marland Kitchen oxyCODONE-acetaminophen (PERCOCET/ROXICET) 5-325 MG per tablet Take 1 tablet by mouth every 6 (six) hours as needed for pain.  30 tablet  0  . sevelamer (RENVELA) 800 MG tablet Take 2 tablets (1,600 mg total) by mouth 3 (three) times daily with  meals.  180 tablet  0  . simvastatin (ZOCOR) 5 MG tablet Take 5 mg by mouth at bedtime.      . traMADol (ULTRAM) 50 MG tablet Take 50 mg by mouth every 6 (six) hours as needed for pain. For pain       Current Medications . [START ON 09/09/2012] antiseptic oral rinse  15 mL Mouth Rinse q12n4p  . chlorhexidine  15 mL Mouth Rinse BID  . heparin subcutaneous  5,000 Units Subcutaneous Q8H  . insulin aspart  0-9 Units Subcutaneous Q4H  . pantoprazole (PROTONIX) IV  40 mg Intravenous Q24H  . piperacillin-tazobactam (ZOSYN)  IV  2.25 g Intravenous Q8H  . vancomycin  1,500 mg Intravenous Once     Physical exam Blood pressure 100/62, pulse 76, temperature 98.7 F (37.1 C), temperature source Oral, resp. rate 16, height 5\' 6"  (1.676 m), weight 166 lb 10.7 oz (75.6 kg), SpO2 96.00%. General: NAD Neck: JVP difficult, probaby elevated, no thyromegaly or thyroid nodule.  Lungs: Rhonchi bilaterally CV: Nondisplaced PMI.  Heart regular S1/S2, no S3/S4, no murmur.  No peripheral edema.  No carotid bruit.   Abdomen: Soft, nontender, no hepatosplenomegaly, no distention.  Skin: Intact without lesions or rashes.  Neurologic: Awake, confused.  Psych: Normal affect. Extremities: No clubbing or cyanosis.  HEENT: Normal.   Labs:   Lab Results  Component Value Date   WBC 5.2 08/06/2012   HGB 12.9* 09/07/2012   HCT 38.0* 09/07/2012   MCV 88.4 08/06/2012   PLT 190 08/06/2012    Recent Labs Lab 09/08/12 1930  NA 139  K 5.7*  CL 96  CO2 21  BUN 98*  CREATININE 14.25*  CALCIUM 9.2  GLUCOSE PENDING   Lab Results  Component Value Date   CKTOTAL 28 08/27/2011   CKMB 2.0 08/27/2011   TROPONINI 3.16* 09/08/2012    Lab Results  Component Value Date   CHOL  Value: 147        ATP III CLASSIFICATION:  <200     mg/dL   Desirable  161-096  mg/dL   Borderline High  >=045    mg/dL   High        4/0/9811   Lab Results  Component Value Date   HDL 33* 07/22/2008   Lab Results  Component Value Date   LDLCALC   Value: 100        Total Cholesterol/HDL:CHD Risk Coronary Heart Disease Risk Table  Men   Women  1/2 Average Risk   3.4   3.3  Average Risk       5.0   4.4  2 X Average Risk   9.6   7.1  3 X Average Risk  23.4   11.0        Use the calculated Patient Ratio above and the CHD Risk Table to determine the patient's CHD Risk.        ATP III CLASSIFICATION (LDL):  <100     mg/dL   Optimal  213-086  mg/dL   Near or Above                    Optimal  130-159  mg/dL   Borderline  578-469  mg/dL   High  >629     mg/dL   Very High* 07/24/8411   Lab Results  Component Value Date   TRIG 72 07/22/2008   Lab Results  Component Value Date   CHOLHDL 4.5 07/22/2008   No results found for this basename: LDLDIRECT      Radiology: - CXR at [facility] with right basilar infiltrate.  EKG: NSR, LVH, inferolateral T wave inversions, slightly more prominent than on prior ECG.   ASSESSMENT AND PLAN:  62 yo with history of PAD, CAD, ESRD, paroxysmal atrial fibrillation/flutter was transferred down from [facility] with confusion, hypotension, and elevated troponin in the setting of no HD since Friday (and Friday was a truncated session).  1. Elevated troponin: History of CAD with subtotal RCA occlusion on 5/13 cath, medical management.  Elevated today, too confused really to tell me if he has had chest pain.  Certainly could be demand ischemia in the setting of volume overload from missing HD and hypotension.   - Would start heparin gtt and ASA 81.  - He can receive statin.  - Hold off on beta blocker with low BP. - Echo.  - cycle cardiac enzymes.  - Will follow to decide +/- LHC during this hospitalization.  2. History of atrial fibrillation/flutter: Currently in NSR.  He has not been on coumadin.  3. Confusion: Suspect uremia.  HD tonight.  4. ID: WBCs not elevated, no fever.  However, hypotensive with R basilar infiltrate on CXR.  Patient is being empirically coverted with antibiotics.   Marca Ancona 09/08/2012 10:46 PM   Signed: @ME1 @ 09/08/2012, 10:31 PM Co-Sign MD

## 2012-09-08 NOTE — Procedures (Signed)
[facility] PCCM: EJ IV placement note  I attempted to place a triple lumen CVL in the left IJ.  The L IJ vessel was viewed to be patent, but the wire would not pass beyond about 5 cm.  The EJ was accessed with ultrasound.  The wire could be passed normally through the EJ, so apparently the stenosis is in the distal IJ but not brachocephalic.  18 gauge, 4cm catheter left in EJ for heparin/antibiotics  Yolonda Kida PCCM Pager: [phone]

## 2012-09-08 NOTE — Consult Note (Signed)
[name] 09/08/2012 Nicholos Aloisi D Requesting Physician:  Dr Craige Cotta  Reason for Consult:  ESRD pt with high K and low BP HPI: The patient is a 62 y.o. year-old with hx of ESRD, CVA, PVD and HTN. Patient gets HD in [place] with Dr Fausto Skillern.  Patient presented to HD  Today and was very lethargic and confused and so was sent to ED.  In ED K+ was 6.7, BP was low and pt confused.  CXR showed worsening of chronic R effusion/consolidation. Pt rec'd Rx with IV Ca, ins, glucose and po Kayexalate x 2 doses and tx'd to [facility] CCU.  Last HD was Friday, Bun and Cr are high today. Pt confused but responsive. Does not remember having access procedure yesterday here (ligation of nonmaturing LUA BVT, placement new LUA AV graft and placement of new R femoral tunneled HD cath).  Family is here, does not provide any other hx.    ROS   Pt confused. Denies CP, sob. ++ cough, no fever  no n/v/d    Past Medical History  Past Medical History  Diagnosis Date  . Hyperlipidemia   . ESRD (end stage renal disease)     a. MWF dialysis in [place] (followed by Dr. Kristian Covey)  . Arthritis   . Claudication   . Stroke 2005  . Anemia   . Chronic diastolic CHF (congestive heart failure) 07/2010    a. 07/2008 Echo EF 50-55%, mild-mod LVH, Gr 1 DD.  Marland Kitchen Peripheral vascular occlusive disease     a. 07/2011 Periph Angio: No signif Ao-illiac dzs, LCF stenosis, 100% LSFA w recon above knee pop and 3 vessel runoff, 100% RSFA w/ recon above knee --> pending L Fem to below Knee pop bypass.  Marland Kitchen History of tobacco abuse   . Hypertension     takes Amlodipine,Metoprolol,and Prinivil daily  . Dyspnea on exertion     with exertion  . GERD (gastroesophageal reflux disease)   . Cataract     bilateral  . Dialysis patient   . PUD (peptic ulcer disease) mid 1990's    with GI bleed.  endoscoped in mid 1990's at [facility]    Past Surgical History  Past Surgical History  Procedure Laterality Date  . Arteriovenous graft  placement    . Av fistula placement  12/10/2000    Right brachiocephalic arteriovenous   . Hernia repair    . Aortagram  07/29/2011    Abdominal Aortagram  . Cardiac catheterization  08/01/11    Left heart catheterization  . Femoral-popliteal bypass graft  09/09/2011    Procedure: BYPASS GRAFT FEMORAL-POPLITEAL ARTERY;  Surgeon: [name], MD;  Location: [facility] OR;  Service: Vascular;  Laterality: Left;  . Amputation  09/12/2011    Procedure: AMPUTATION BELOW KNEE;  Surgeon: [name], MD;  Location: [facility] OR;  Service: Vascular;  Laterality: Left;  TRANSMETATARSAL  . Application of wound vac  10/30/2011    Procedure: APPLICATION OF WOUND VAC;  Surgeon: [name], MD;  Location: [facility] OR;  Service: Vascular;  Laterality: Left;  . Groin debridement  11/01/2011    Procedure: Drucie Ip DEBRIDEMENT;  Surgeon: Chuck Hint, MD;  Location: [facility] OR;  Service: Vascular;  Laterality: Left;  . Esophagogastroduodenoscopy  11/06/2011    Procedure: ESOPHAGOGASTRODUODENOSCOPY (EGD);  Surgeon: Beverley Fiedler, MD;  Location: [facility] ENDOSCOPY;  Service: Gastroenterology;  Laterality: N/A;  . Femoral-popliteal bypass graft  01/02/2012    Procedure: BYPASS GRAFT FEMORAL-POPLITEAL ARTERY;  Surgeon: Fran Lowes  Myra Gianotti, MD;  Location: [facility] OR;  Service: Vascular;  Laterality: Right;  . Amputation  01/02/2012    Procedure: AMPUTATION RAY;  Surgeon: [name], MD;  Location: [facility] OR;  Service: Vascular;  Laterality: Right;  . Toe amputation Right     5/13  . Exchange of a dialysis catheter N/A 07/15/2012    Procedure: EXCHANGE OF A DIALYSIS CATHETER;  Surgeon: Fransisco Hertz, MD;  Location: [facility] OR;  Service: Vascular;  Laterality: N/A;  . Av fistula placement Left 07/15/2012    Procedure: ARTERIOVENOUS (AV) FISTULA CREATION;  Surgeon: Fransisco Hertz, MD;  Location: [facility] OR;  Service: Vascular;  Laterality: Left;    Family History  Family History  Problem Relation Age of Onset  . Breast cancer Mother   . Cancer Mother    . Cancer Father   . Anesthesia problems Neg Hx   . Hypotension Neg Hx   . Malignant hyperthermia Neg Hx   . Pseudochol deficiency Neg Hx    Social History  reports that he quit smoking about 2 months ago. His smoking use included Cigarettes. He has a 12 pack-year smoking history. He has never used smokeless tobacco. He reports that he does not drink alcohol or use illicit drugs.  Allergies  Allergies  Allergen Reactions  . Ambien (Zolpidem Tartrate) Other (See Comments)    Hallucinations    Home medications Prior to Admission medications   Medication Sig Start Date End Date Taking? Authorizing Provider  ALPRAZolam Prudy Feeler) 1 MG tablet Take 1 tablet by mouth daily. 04/13/12   Historical Provider, MD  calcium acetate (PHOSLO) 667 MG capsule Take 1,334-2,001 mg by mouth 3 (three) times daily with meals.     Historical Provider, MD  cinacalcet (SENSIPAR) 60 MG tablet Take 60 mg by mouth daily.    Historical Provider, MD  FOSRENOL 1000 MG chewable tablet Chew 1,000 mg by mouth 2 (two) times daily with a meal.  05/24/12   Historical Provider, MD  gabapentin (NEURONTIN) 100 MG capsule Take 100 mg by mouth daily.    Historical Provider, MD  hydrALAZINE (APRESOLINE) 25 MG tablet Take 25 mg by mouth 3 (three) times daily.    Historical Provider, MD  isosorbide mononitrate (IMDUR) 30 MG 24 hr tablet Take 30 mg by mouth daily.    Historical Provider, MD  levofloxacin (LEVAQUIN) 500 MG tablet Take 1 tablet (500 mg total) by mouth daily. 08/06/12   [name], MD  multivitamin (RENA-VIT) TABS tablet Take 1 tablet by mouth daily.    Historical Provider, MD  naproxen sodium (ALEVE) 220 MG tablet Take 440 mg by mouth daily as needed (For headache.).    Historical Provider, MD  oxyCODONE-acetaminophen (PERCOCET/ROXICET) 5-325 MG per tablet Take 1 tablet by mouth every 6 (six) hours as needed for pain. 09/07/12   Lars Mage, PA-C  sevelamer (RENVELA) 800 MG tablet Take 2 tablets (1,600 mg total) by  mouth 3 (three) times daily with meals. 11/07/11 11/06/12  Renae Fickle, MD  simvastatin (ZOCOR) 5 MG tablet Take 5 mg by mouth at bedtime.    Historical Provider, MD    LABS Liver Function Tests No results found for this basename: AST, ALT, ALKPHOS, BILITOT, PROT, ALBUMIN,  in the last 168 hours No results found for this basename: LIPASE, AMYLASE,  in the last 168 hours CBC  Recent Labs Lab 09/07/12 0704 09/07/12 0719  HGB 13.3 12.9*  HCT 39.0 38.0*   Basic Metabolic Panel  Recent Labs  Lab 09/07/12 0704 09/07/12 0719  NA 135 135  K 6.2* 5.1  GLUCOSE 86 98    Physical Exam:  Blood pressure 75/40, pulse 79, temperature 99.2 F (37.3 C), temperature source Oral, resp. rate 11, height 5\' 6"  (1.676 m), weight 75.6 kg (166 lb 10.7 oz), SpO2 97.00%. Gen: chronically iill appearing AAM in no distress, lethargic, occ jerking of extremities, some twitching of facial muscles during exam HEENT:  EOMI, sclera anicteric, throat clear Neck: + JVD, no LAN Chest: dec'd BS R base, L clear CV: regular, no rub or gallop, pedal pulses absent Abdomen: soft, nontender, no ascites or HSM Ext: no LE edema, s/p L TMA and toe amps on R foot, no gangrene or ulceration Neuro: oriented to person only, mod LUE weakness Access: left upper arm dressing not removed, good bruit over new access. R thigh TDC in place.   Outpatient HD: [facility] [place], do not have HD info tonight   Impression 1. Hyperkalemia- due to missed HD, ESRD 2. ESRD, last HD 6/13- [facility] [place] pt.  HD tonight 3. AMS- prob combination of infection, uremia, +/- hypotension 4. Hypotension- may be inaccurate reading due to PVD, or septic shock. Doubt vol depletion w esrd.   5. Suspected PNA / chronic R effusion- abx per pulm 6. HTN/volume- get dry wt in am from center. Takes hydralazine, Imdur at home. Hold due to low BP. 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  7. Anemia of CKD- Hb normal  yest 8. Access- s/p new R thigh TDC and new LUA AVG 6/17 (yest) 9. Secondary HPTH- cont sensipar, Renvela 1 ac, and phoslo 1ac 10. Hx PVD 11. HX CVA  Plan- acute HD tonight for uremia and hyperkalemia.  No fluid off due to hypotension, support BP w NS and/or pressors as needed.  Will follow   Vinson Moselle  MD [facility] [phone] pgr    [phone] cell 09/08/2012, 6:40 PM

## 2012-09-09 ENCOUNTER — Inpatient Hospital Stay (HOSPITAL_COMMUNITY): Payer: Medicare Other

## 2012-09-09 ENCOUNTER — Encounter (HOSPITAL_COMMUNITY): Payer: Self-pay | Admitting: Vascular Surgery

## 2012-09-09 DIAGNOSIS — Z992 Dependence on renal dialysis: Secondary | ICD-10-CM

## 2012-09-09 DIAGNOSIS — J9602 Acute respiratory failure with hypercapnia: Secondary | ICD-10-CM | POA: Diagnosis not present

## 2012-09-09 LAB — CORTISOL: Cortisol, Plasma: 13.7 ug/dL

## 2012-09-09 LAB — POCT I-STAT 3, ART BLOOD GAS (G3+)
Bicarbonate: 29 mEq/L — ABNORMAL HIGH (ref 20.0–24.0)
Bicarbonate: 28.7 mEq/L — ABNORMAL HIGH (ref 20.0–24.0)
O2 Saturation: 91 %
Patient temperature: 99.5
TCO2: 32 mmol/L (ref 0–100)
TCO2: 31 mmol/L (ref 0–100)
pCO2 arterial: 52.8 mmHg — ABNORMAL HIGH (ref 35.0–45.0)
pCO2 arterial: 55.9 mmHg — ABNORMAL HIGH (ref 35.0–45.0)
pCO2 arterial: 54.2 mmHg — ABNORMAL HIGH (ref 35.0–45.0)
pH, Arterial: 7.374 (ref 7.350–7.450)
pH, Arterial: 7.323 — ABNORMAL LOW (ref 7.350–7.450)
pH, Arterial: 7.333 — ABNORMAL LOW (ref 7.350–7.450)
pO2, Arterial: 66 mmHg — ABNORMAL LOW (ref 80.0–100.0)
pO2, Arterial: 308 mmHg — ABNORMAL HIGH (ref 80.0–100.0)

## 2012-09-09 LAB — PROTEIN, BODY FLUID: Total protein, fluid: 4.3 g/dL

## 2012-09-09 LAB — GLUCOSE, CAPILLARY
Glucose-Capillary: 134 mg/dL — ABNORMAL HIGH (ref 70–99)
Glucose-Capillary: 44 mg/dL — CL (ref 70–99)
Glucose-Capillary: 54 mg/dL — ABNORMAL LOW (ref 70–99)
Glucose-Capillary: <10 mg/dL — CL (ref 70–99)
Glucose-Capillary: 135 mg/dL — ABNORMAL HIGH (ref 70–99)
Glucose-Capillary: 109 mg/dL — ABNORMAL HIGH (ref 70–99)
Glucose-Capillary: 96 mg/dL (ref 70–99)
Glucose-Capillary: 36 mg/dL — CL (ref 70–99)
Glucose-Capillary: 94 mg/dL (ref 70–99)
Glucose-Capillary: 66 mg/dL — ABNORMAL LOW (ref 70–99)
Glucose-Capillary: 155 mg/dL — ABNORMAL HIGH (ref 70–99)

## 2012-09-09 LAB — BASIC METABOLIC PANEL
CO2: 25 mEq/L (ref 19–32)
Calcium: 9.3 mg/dL (ref 8.4–10.5)
Glucose, Bld: 142 mg/dL — ABNORMAL HIGH (ref 70–99)
Potassium: 4.5 mEq/L (ref 3.5–5.1)
Sodium: 131 mEq/L — ABNORMAL LOW (ref 135–145)

## 2012-09-09 LAB — HEPATITIS B SURFACE ANTIBODY,QUALITATIVE: Hep B S Ab: REACTIVE — AB

## 2012-09-09 LAB — CBC
MCV: 86.2 fL (ref 78.0–100.0)
Platelets: 152 10*3/uL (ref 150–400)
RBC: 3.54 MIL/uL — ABNORMAL LOW (ref 4.22–5.81)
WBC: 8.6 10*3/uL (ref 4.0–10.5)

## 2012-09-09 LAB — BODY FLUID CELL COUNT WITH DIFFERENTIAL
Lymphs, Fluid: 31 %
Monocyte-Macrophage-Serous Fluid: 15 % — ABNORMAL LOW (ref 50–90)

## 2012-09-09 LAB — TROPONIN I
Troponin I: 3.22 ng/mL (ref <0.30)
Troponin I: 4.24 ng/mL (ref <0.30)

## 2012-09-09 LAB — HEPATITIS B SURFACE ANTIGEN: Hepatitis B Surface Ag: NEGATIVE

## 2012-09-09 LAB — LACTATE DEHYDROGENASE, PLEURAL OR PERITONEAL FLUID

## 2012-09-09 MED ORDER — DEXTROSE 50 % IV SOLN
INTRAVENOUS | Status: AC
  Administered 2012-09-09: 50 mL
  Filled 2012-09-09: qty 50

## 2012-09-09 MED ORDER — ROCURONIUM BROMIDE 50 MG/5ML IV SOLN
INTRAVENOUS | Status: AC
  Filled 2012-09-09: qty 2

## 2012-09-09 MED ORDER — LIDOCAINE HCL (CARDIAC) 20 MG/ML IV SOLN
INTRAVENOUS | Status: AC
  Filled 2012-09-09: qty 5

## 2012-09-09 MED ORDER — VANCOMYCIN HCL IN DEXTROSE 750-5 MG/150ML-% IV SOLN
750.0000 mg | Freq: Once | INTRAVENOUS | Status: AC
  Administered 2012-09-09: 750 mg via INTRAVENOUS
  Filled 2012-09-09 (×2): qty 150

## 2012-09-09 MED ORDER — SODIUM CHLORIDE 0.9 % IV BOLUS (SEPSIS)
500.0000 mL | Freq: Once | INTRAVENOUS | Status: AC
  Administered 2012-09-09: 14:00:00 via INTRAVENOUS

## 2012-09-09 MED ORDER — DEXTROSE 50 % IV SOLN
25.0000 mL | Freq: Once | INTRAVENOUS | Status: AC
  Administered 2012-09-09: 25 mL via INTRAVENOUS

## 2012-09-09 MED ORDER — FENTANYL CITRATE 0.05 MG/ML IJ SOLN
50.0000 ug | INTRAMUSCULAR | Status: DC | PRN
  Administered 2012-09-09 – 2012-09-10 (×6): 100 ug via INTRAVENOUS
  Filled 2012-09-09 (×5): qty 2

## 2012-09-09 MED ORDER — DEXTROSE 50 % IV SOLN
INTRAVENOUS | Status: AC
  Administered 2012-09-09: 25 mL
  Filled 2012-09-09: qty 50

## 2012-09-09 MED ORDER — ETOMIDATE 2 MG/ML IV SOLN
INTRAVENOUS | Status: AC
  Administered 2012-09-09: 20 mg
  Filled 2012-09-09: qty 20

## 2012-09-09 MED ORDER — SUCCINYLCHOLINE CHLORIDE 20 MG/ML IJ SOLN
INTRAMUSCULAR | Status: AC
  Filled 2012-09-09: qty 1

## 2012-09-09 MED ORDER — MIDAZOLAM HCL 2 MG/2ML IJ SOLN
INTRAMUSCULAR | Status: AC
  Administered 2012-09-09: 2 mg
  Filled 2012-09-09: qty 4

## 2012-09-09 MED ORDER — FENTANYL CITRATE 0.05 MG/ML IJ SOLN
INTRAMUSCULAR | Status: AC
  Administered 2012-09-09: 100 ug
  Filled 2012-09-09: qty 4

## 2012-09-09 MED ORDER — DEXTROSE 50 % IV SOLN
INTRAVENOUS | Status: AC
  Administered 2012-09-09: 19:00:00
  Filled 2012-09-09: qty 50

## 2012-09-09 MED ORDER — DEXTROSE 50 % IV SOLN: 25.0000 mL | Freq: Once | INTRAVENOUS | Status: AC

## 2012-09-09 MED ORDER — MIDAZOLAM HCL 2 MG/2ML IJ SOLN
2.0000 mg | INTRAMUSCULAR | Status: DC | PRN
  Administered 2012-09-09: 2 mg via INTRAVENOUS

## 2012-09-09 MED ORDER — DEXTROSE 10 % IV SOLN: INTRAVENOUS | Status: DC

## 2012-09-09 NOTE — Progress Notes (Signed)
eLink Physician-Brief Progress Note Patient Name: [name] DOB: 21-Sep-1950 MRN: [id number]  Date of Service  09/09/2012   HPI/Events of Note   Altered mental status per RN Sugars 40s despite d10 at 30cc/h  eICU Interventions  Amp d50 Increase d10 to 75cc/h monitor      Adrionna Delcid 09/09/2012, 2:50 AM

## 2012-09-09 NOTE — Progress Notes (Addendum)
eLink Physician-Brief Progress Note Patient Name: DELOSS AMICO DOB: 1950/04/14 MRN: [id number]  Date of Service  09/09/2012   HPI/Events of Note   persistent drowsiness despite correction of hypoglycemia. RASS sedation score -2 the patient is not on sedation. On camera exam appears to be moving air with low lung volumes  According to nurse patient had a head CT prior to transfer that showed old stroke but since then has been on anticoagulation   eICU Interventions   recheck ABG stat  Repeat CT head depending on course       Intervention Category Major Interventions: Change in mental status - evaluation and management  Makell Drohan 09/09/2012, 4:15 AM

## 2012-09-09 NOTE — Progress Notes (Signed)
ANTICOAGULATION ; Antibiotic CONSULT NOTE - Follow Up Consult  Pharmacy Consult for  Heparin for ACS and Vancomycin & Zosyn for empiric PNA Indication: chest pain/ACS  Allergies  Allergen Reactions  . Ambien (Zolpidem Tartrate) Other (See Comments)    Hallucinations    Patient Measurements: Height: 5\' 6"  (167.6 cm) Weight: 172 lb 9.9 oz (78.3 kg) IBW/kg (Calculated) : 63.8  Vital Signs: Temp: 99 F (37.2 C) (06/19 0810) Temp src: Axillary (06/19 0810) BP: 128/54 mmHg (06/19 1000) Pulse Rate: 90 (06/19 1000)  Labs:  Recent Labs  09/07/12 0704 09/07/12 0719 09/08/12 1930 09/09/12 0010 09/09/12 0630 09/09/12 0730  HGB 13.3 12.9*  --   --  9.5*  --   HCT 39.0 38.0*  --   --  30.5*  --   PLT  --   --   --   --  152  --   HEPARINUNFRC  --   --   --   --   --  <0.10*  CREATININE  --   --  14.25*  --  8.11*  --   TROPONINI  --   --  3.16* 4.24*  --  3.22*    Estimated Creatinine Clearance: 9.2 ml/min (by C-G formula based on Cr of 8.11).   Medications:  Prescriptions prior to admission  Medication Sig Dispense Refill  . ALPRAZolam (XANAX) 1 MG tablet Take 1 tablet by mouth daily.      . calcium acetate (PHOSLO) 667 MG capsule Take 1,334-2,001 mg by mouth 3 (three) times daily with meals.       . cinacalcet (SENSIPAR) 60 MG tablet Take 60 mg by mouth daily.      Marland Kitchen FOSRENOL 1000 MG chewable tablet Chew 1,000 mg by mouth 2 (two) times daily with a meal.       . gabapentin (NEURONTIN) 100 MG capsule Take 100 mg by mouth daily.      . hydrALAZINE (APRESOLINE) 25 MG tablet Take 25 mg by mouth 3 (three) times daily.      . isosorbide mononitrate (IMDUR) 30 MG 24 hr tablet Take 30 mg by mouth daily.      . multivitamin (RENA-VIT) TABS tablet Take 1 tablet by mouth daily.      . naproxen sodium (ALEVE) 220 MG tablet Take 440 mg by mouth daily as needed (For headache.).      Marland Kitchen oxyCODONE-acetaminophen (PERCOCET/ROXICET) 5-325 MG per tablet Take 1 tablet by mouth every 6 (six)  hours as needed for pain.  30 tablet  0  . sevelamer (RENVELA) 800 MG tablet Take 2 tablets (1,600 mg total) by mouth 3 (three) times daily with meals.  180 tablet  0  . simvastatin (ZOCOR) 5 MG tablet Take 5 mg by mouth at bedtime.      . traMADol (ULTRAM) 50 MG tablet Take 50 mg by mouth every 6 (six) hours as needed for pain. For pain        Admit Complaint: 62 yo with  admitted 09/08/2012  AMS changes, hypotension and suspected PNA.  Pharmacy was consulted to start heparin for + troponin's.  Events: thoracentesis in process, heparin on hold, to resume if fup cxr is normal with no bleeding.    Anticoagulation - ACS: heparin on hold, follow up restart Infectious Disease - suspected PNA -Afeb, WBC wnl  ABX: Vancomycin 6/18>> Zosyn 6/18>>>  Cardiovascular - Hyperlipidemia, Claudication, CHF, PVD -ASA, BP 110-130, HR 80-90  Endocrinology - No issues  Gastrointestinal / Nutrition - PUD -  IV PPI (change to oral when able);  Neurology - CVA Nephrology - ESRD with ongoing HD. His last HD was Friday 6/13, done 6/18, and plan to repeat today - Phoslo, Renvela, Fosrenol, Sensipar - Currently with hyperkalemia Pulmonary - His CXR reveals a pleural effusion and he is currently on 3L [place]. > tap in progress PTA Medication Issues Home medications not ordered:  (Hydralazine, Imdur, Zocor)  Best Practices - Heparin> follow up resume  Goal of Therapy:  Heparin level 0.3-0.7 units/ml; Vancomycin trough 15-20 mcg/ml, Renal adjustment of antibiotics. Monitor platelets by anticoagulation protocol: Yes  Plan:  1. Will give one dose of IV Vancomycin 1500 mg and f/u his HD schedule.> plan 750 mg IV Vancomycin s/p HD today. 2. Will begin IV Zosyn at 2.25gm every 8 hours  3. Monitor micro-data, renal function and s/s of infection   Thank you for allowing pharmacy to be a part of this patients care team.  Lovenia Kim Pharm.D., BCPS Clinical Pharmacist 09/09/2012 11:42 AM Pager: [phone] Phone:  [phone]

## 2012-09-09 NOTE — Procedures (Signed)
Intubation Procedure Note [name] [id number] [birth year]  Procedure: Intubation Indications: Respiratory insufficiency  Procedure Details Consent: Risks of procedure as well as the alternatives and risks of each were explained to the (patient/caregiver).  Consent for procedure obtained. Time Out: Verified patient identification, verified procedure, site/side was marked, verified correct patient position, special equipment/implants available, medications/allergies/relevent history reviewed, required imaging and test results available.  Performed  Maximum sterile technique was used including gloves, hand hygiene and mask.  MAC and 3    Evaluation Hemodynamic Status: BP stable throughout; O2 sats: stable throughout Patient's Current Condition: stable Complications: No apparent complications Patient did tolerate procedure well. Chest X-ray ordered to verify placement.  CXR: pending.   Rosebrock, Richard A 09/09/2012  I was present & supervised the entire procedure & medication administration  ALVA,RAKESH V.

## 2012-09-09 NOTE — Procedures (Signed)
Thoracentesis Procedure Note  Pre-operative Diagnosis: Pleural effusion  Post-operative Diagnosis:same  Indications: SOB  Procedure Details  Consent: Informed consent was obtained. Risks of the procedure were discussed including: infection, bleeding, pain, pneumothorax.  Under sterile conditions the patient was positioned. Betadine solution and sterile drapes were utilized.  1% buffered lidocaine was used to anesthetize the 6 rib space. Fluid was obtained without any difficulties and minimal blood loss.  A dressing was applied to the wound and wound care instructions were provided.   Findings 1000 ml of bloody pleural fluid was obtained. A sample was sent to Pathology for cytogenetics, flow, and cell counts, as well as for infection analysis.  Complications:  None; patient tolerated the procedure well.          Condition: stable  Plan A follow up chest x-ray was ordered. Bed Rest for 2 hours. Tylenol 650 mg. for pain.   Brett Canales Minor ACNP Adolph Pollack PCCM Pager [phone] till 3 pm If no answer page [phone] 09/09/2012, 11:39 AM  Attending Attestation: I was present and scrubbed for the entire procedure.  Ultrasound used for site verification. Post procedure CXR showed RLL ex vacuo pneumothorax  Ah Bott V.

## 2012-09-09 NOTE — Progress Notes (Signed)
Subjective:  On BIPAP MS in and out Can answer a simple question then falls asleep  Objective Vital signs in last 24 hours: Filed Vitals:   09/09/12 0810 09/09/12 0900 09/09/12 0935 09/09/12 1000  BP: 110/41 134/44  128/54  Pulse: 86 88  90  Temp: 99 F (37.2 C)     TempSrc: Axillary     Resp: 9 9 18 10   Height:      Weight:      SpO2: 100% 99% 98% 99%   Weight change:   Intake/Output Summary (Last 24 hours) at 09/09/12 1026 Last data filed at 09/09/12 0900  Gross per 24 hour  Intake    861 ml  Output   -930 ml  Net   1791 ml   Physical Exam: BP 128/54  Pulse 90  Temp(Src) 99 F (37.2 C) (Axillary)  Resp 10  Ht 5\' 6"  (1.676 m)  Wt 78.3 kg (172 lb 9.9 oz)  BMI 27.87 kg/m2  SpO2 99% Bearded BM On BIPAP Doesn't stay awake Answers only simple questions Poor resp effort Regular rhythm  S1S2 No S3 Left upper arm AVG (new) + bruit and thrill Abdomen non tender Right femoral TDC clean and dry No edema of LE's  Labs: Basic Metabolic Panel:  Recent Labs Lab 09/07/12 0704 09/07/12 0719 09/08/12 1930 09/09/12 0630  NA 135 135 139 131*  K 6.2* 5.1 5.7* 4.5  CL  --   --  96 92*  CO2  --   --  21 25  GLUCOSE 86 98 31* 142*  BUN  --   --  98* 42*  CREATININE  --   --  14.25* 8.11*  CALCIUM  --   --  9.2 9.3  PHOS  --   --  9.8*  --     Recent Labs Lab 09/08/12 1930  ALBUMIN 2.4*  CBC:  Recent Labs Lab 09/07/12 0704 09/07/12 0719 09/09/12 0630  WBC  --   --  8.6  HGB 13.3 12.9* 9.5*  HCT 39.0 38.0* 30.5*  MCV  --   --  86.2  PLT  --   --  152   Results for OZIL, STETTLER (MRN [id number]) as of 09/09/2012 10:22  Ref. Range 09/09/2012 01:14 09/09/2012 05:05  Sample type No range found ARTERIAL ARTERIAL  pH, Arterial Latest Range: 7.350-7.450  7.374 7.323 (L)  pCO2 arterial Latest Range: 35.0-45.0 mmHg 52.8 (H) 55.9 (H)  pO2, Arterial Latest Range: 80.0-100.0 mmHg 370.0 (H) 66.0 (L)  Bicarbonate Latest Range: 20.0-24.0 mEq/L 30.6 (H) 29.0 (H)   TCO2 Latest Range: 0-100 mmol/L 32 31  Acid-Base Excess Latest Range: 0.0-2.0 mmol/L 5.0 (H) 2.0  O2 Saturation No range found 100.0 91.0  Patient temperature No range found 99.5 F 98.6 F    Recent Labs Lab 09/08/12 1930 09/09/12 0010 09/09/12 0730  TROPONINI 3.16* 4.24* 3.22*   CBG:  Recent Labs Lab 09/09/12 0311 09/09/12 0356 09/09/12 0539 09/09/12 0808 09/09/12 1001  GLUCAP 133* 135* 109* 96 101*    Studies/Results: Dg Chest Port 1 View  09/09/2012   *RADIOLOGY REPORT*  Clinical Data: Pulmonary edema, right pleural effusion, and shortness of breath.  PORTABLE CHEST - 1 VIEW 5:22 a.m.  Comparison: 6/19 ( 12:09 am), 6/18, and 09/07/2012  Findings: There is increased bilateral interstitial pulmonary edema.  Right pleural effusion is unchanged.  Heart size is unchanged.  IMPRESSION: Increased bilateral pulmonary edema.  Stable large right pleural effusion.   Original Report Authenticated By:  Francene Boyers, M.D.   Dg Chest Port 1 View  09/09/2012   *RADIOLOGY REPORT*  Clinical Data: Central venous line attempt.  Catheter did not pass.  PORTABLE CHEST - 1 VIEW  Comparison: Chest radiograph 09/08/2012 and 09/07/2012.  CT chest 08/06/2012.  Findings: Stable cardiomegaly.  Moderate sized right pleural effusion.  Right perihilar and right basilar lung opacities are unchanged.  Small opacity in the lingula of the left lung could reflect atelectasis or airspace disease.  Negative for pneumothorax.  No visible pleural effusion on the left.  Dialysis catheter via an inferior approach projects over the expected location of the inferior vena cava and terminates in the expected location of the right atrium.  IMPRESSION: No significant change in moderate right pleural effusion and right perihilar and right basilar opacities. No evidence of pneumothorax.   Original Report Authenticated By: Britta Mccreedy, M.D.   Dg Chest Port 1 View  09/07/2012   *RADIOLOGY REPORT*  Clinical Data: Diatek catheter  insertion.  PORTABLE CHEST - 1 VIEW  Comparison: 09/07/2012.  Findings: A femoral diatek catheter is coursing up the IVC.  The proximal port is in the right atrium and distal port is in the distal SVC.  The cardiac silhouette, mediastinal and hilar contours are stable.  Persistent right pleural effusion and overlying atelectasis.  IMPRESSION:  1.  Dialysis catheter tips in good position without complicating features. 2.  Stable large pleural effusion on the right with overlying atelectasis.   Original Report Authenticated By: Rudie Meyer, M.D.   Medications: . sodium chloride 10 mL/hr at 09/08/12 1930  . dextrose 75 mL/hr at 09/09/12 0400  . dextrose 5 % and 0.9% NaCl 30 mL/hr at 09/08/12 2202  . heparin 900 Units/hr (09/09/12 0028)   . antiseptic oral rinse  15 mL Mouth Rinse q12n4p  . aspirin  81 mg Oral Daily  . atorvastatin  80 mg Oral q1800  . chlorhexidine  15 mL Mouth Rinse BID  . insulin aspart  0-9 Units Subcutaneous Q4H  . pantoprazole (PROTONIX) IV  40 mg Intravenous Q24H  . piperacillin-tazobactam (ZOSYN)  IV  2.25 g Intravenous Q8H  . vancomycin  1,500 mg Intravenous Once    I  have reviewed scheduled and prn medications.  ASSESSMENT/RECOMMENDATIONS  1. Hyperkalemia- due to missed HD, ESRD; resolved after HD last 6/18  but CXR today shows increased pulm edema despite little peripheral edema; had a liter off last night.  Potassium normal but with CXR findings, will HD again today primarily for volume.   2. ESRD, last HD 6/13- [facility] [place] pt. Had HD 6/18. HD again today for pulm issues  3. AMS- etiology unclear. 4. Hypercarbic resp failure - pulm edema and large right effusion  BIPAP and HD again today for volume; ? Tap effusion 5. Hypotension- Resolved and BP's normal to elevated now  6. Elevated troponins - cards evaluating  For echo 7. Suspected PNA / chronic R effusion- abx per pulm 8. HTN/volume- get dry wt  from center. Takes hydralazine, Imdur at home. On hold due to low  BP.  9. Anemia of CKD- get details of EPO from HD unit 10. Access- s/p new R thigh TDC and new LUA AVG 6/17  11. Secondary HPTH- cont sensipar, Renvela 1 ac, and phoslo 1ac when taking good po 12. Hx PVD 13. HX CVA   Camille Bal, MD [facility] [phone] pager 09/09/2012, 10:26 AM

## 2012-09-09 NOTE — Progress Notes (Signed)
PULMONARY  / CRITICAL CARE MEDICINE  Name: [name] MRN: [id number] DOB: [birth year]    ADMISSION DATE:  09/08/2012  REFERRING MD :  ER  CHIEF COMPLAINT: Change in mental status  BRIEF PATIENT DESCRIPTION:  62 yo male brought to ED by family with change in mental status.  Noted to have fever, and hyperkalemia.  Transferred from [facility] to [facility] for emergent HD. ABG was 7.33/49/68 on 3 liters.  UDS was negative.  Lactic acid was 1.9.  Troponin was 3.92.  ON 6/17 he had an access procedure with ligation of nonmaturing LUA BVT, placement new LUA AV graft and placement of new R femoral tunneled HD cath.     SIGNIFICANT EVENTS: 6/19 hypercarbic resp failure on bipap  STUDIES:  6/18 CT head [facility]) >>progressive microvascular ischemic changes, old Rt parietal and Lt cerebellar infarcts  LINES / TUBES: Rt femoral HD cath >> LEJ 6/18 >>  CULTURES: Blood 6/18 >>  ANTIBIOTICS: Levaquin 6/18 >> 6/18 Vancomycin 6/18 >>  Zosyn 6/18 >>    SUBJECTIVE: opens eyes to name Afebrile Required bipap overnight for hypercarbia   VITAL SIGNS: Temp:  [98.7 F (37.1 C)-99.7 F (37.6 C)] 99.7 F (37.6 C) (06/19 0400) Pulse Rate:  [71-87] 87 (06/19 0600) Resp:  [8-26] 12 (06/19 0700) BP: (75-152)/(12-113) 112/43 mmHg (06/19 0700) SpO2:  [77 %-100 %] 99 % (06/19 0600) FiO2 (%):  [28 %-60 %] 50 % (06/19 0600) Weight:  [75.6 kg (166 lb 10.7 oz)-78.3 kg (172 lb 9.9 oz)] 78.3 kg (172 lb 9.9 oz) (06/19 0451) HEMODYNAMICS:   VENTILATOR SETTINGS: Vent Mode:  [-]  FiO2 (%):  [28 %-60 %] 50 % INTAKE / OUTPUT: Intake/Output     06/18 0701 - 06/19 0700 06/19 0701 - 06/20 0700   I.V. (mL/kg) 384 (4.9)    IV Piggyback 300    Total Intake(mL/kg) 684 (8.7)    Other -930    Total Output -930     Net +1614            PHYSICAL EXAMINATION: General: Ill appearing Neuro: Somnolent, easily wakes up to name, follows commands, decreased movement of LUE, LEs 3/5, RUE 4/5 HEENT: Pupils  reactive, dry oral mucosa, poor dentition Cardiovascular: regular, no murmur Lungs:  Decreased breath sounds, scattered rhonchi, absent breath sounds Rt base Abdomen:  Soft, decreased bowel sounds, non tender Musculoskeletal:  Lt and Rt TMA, no edema, AV graft Lt arm, HD cath Rt femoral region Skin: dry gangrene Rt foot  LABS:  Recent Labs Lab 09/07/12 0704 09/07/12 0719 09/08/12 1930 09/09/12 0010 09/09/12 0114 09/09/12 0505 09/09/12 0645  HGB 13.3 12.9*  --   --   --   --  9.5*  WBC  --   --   --   --   --   --  8.6  PLT  --   --   --   --   --   --  PENDING  NA 135 135 139  --   --   --   --   K 6.2* 5.1 5.7*  --   --   --   --   CL  --   --  96  --   --   --   --   CO2  --   --  21  --   --   --   --   GLUCOSE 86 98 31*  --   --   --   --  BUN  --   --  98*  --   --   --   --   CREATININE  --   --  14.25*  --   --   --   --   CALCIUM  --   --  9.2  --   --   --   --   PHOS  --   --  9.8*  --   --   --   --   ALBUMIN  --   --  2.4*  --   --   --   --   TROPONINI  --   --  3.16* 4.24*  --   --   --   PHART  --   --   --   --  7.374 7.323*  --   PCO2ART  --   --   --   --  52.8* 55.9*  --   PO2ART  --   --   --   --  370.0* 66.0*  --     Recent Labs Lab 09/09/12 0245 09/09/12 0307 09/09/12 0309 09/09/12 0311 09/09/12 0356  GLUCAP 40* 40* 91 133* 135*    CXR: 6/19 >> Increased Rt effusion and infiltrate.  ASSESSMENT / PLAN:  PULMONARY A: Hypoxemia with progressive chronic Rt effusion and infiltrate.  Likely also has some degree of hypoventilation. Thoracentesis from 08/15/11 >> protein 3.2, LDH 155, WBC 214, reactive mesothelial cells. P:   -supplemental oxygen to keep SpO2 > 92% -Will consider thoracentesis for  pleural fluid sampling -BiPAP prn  CARDIOVASCULAR A: Elevated cardiac enzymes >> uncertain significance in setting of renal failure. Hx of PVD, HTN, Diastolic heart failure. Hypotension >> ? How accurate BP readings are with cuff on leg -  improved P:  -even fluid balance   RENAL A:  ESRD. Hyperkalemia. P:   -Consider rpt HD per renal  GASTROINTESTINAL A:  Nutrition. Hx of PUD. P:   -NPO until mental status improved -Protonix for SUP  HEMATOLOGIC A:  No acute issues. P:  -f/u CBC -SQ heparin for DVT prevention  INFECTIOUS A:  Fever, progressive Rt infiltrate/effusion, healed sacral decubitus ulcer. P:   -D2 vancomycin, zosyn -Await blood cultures -appreciate wound care consult  ENDOCRINE A: Hyperglycemia.   P:   -SSI  NEUROLOGIC A:  Acute encephalopathy likely 2nd to uremia. Hx of CVA. P:   -Neuro input ? Embolic events after graft manipulation ? Related to old infarcts  Care during the described time interval was provided by me and/or other providers on the critical care team.  I have reviewed this patient's available data, including medical history, events of note, physical examination and test results as part of my evaluation  CC time x 40 m  Cyril Mourning MD. Tonny Bollman. [facility] Pager [phone] If no response call [phone]    09/09/2012, 7:57 AM

## 2012-09-09 NOTE — Consult Note (Signed)
Reason for Consult:Altered mental status Referring Physician: Vassie Loll  CC: Lethargy  HPI: [name] is an 62 y.o. male who was initially admitted with altered mental status, fever and hyperkalemia.  He has ESRD.  Has been found to have pulmonary edema as well as an infiltrate and has required CT placement.  Patient continues to receive HD.  Has been very lethargic.  Today required sedation and HD.  After both of these procedures seemed more alert but still has not returned to baseline mental status.    Past Medical History  Diagnosis Date  . Hyperlipidemia   . ESRD (end stage renal disease)     a. MWF dialysis in [place] (followed by Dr. Kristian Covey)  . Arthritis   . Claudication   . Stroke 2005  . Anemia   . Chronic diastolic CHF (congestive heart failure) 07/2010    a. 07/2008 Echo EF 50-55%, mild-mod LVH, Gr 1 DD.  Marland Kitchen Peripheral vascular occlusive disease     a. 07/2011 Periph Angio: No signif Ao-illiac dzs, LCF stenosis, 100% LSFA w recon above knee pop and 3 vessel runoff, 100% RSFA w/ recon above knee --> pending L Fem to below Knee pop bypass.  Marland Kitchen History of tobacco abuse   . Hypertension     takes Amlodipine,Metoprolol,and Prinivil daily  . Dyspnea on exertion     with exertion  . GERD (gastroesophageal reflux disease)   . Cataract     bilateral  . Dialysis patient   . PUD (peptic ulcer disease) mid 1990's    with GI bleed.  endoscoped in mid 1990's at [facility]    Past Surgical History  Procedure Laterality Date  . Arteriovenous graft placement    . Av fistula placement  12/10/2000    Right brachiocephalic arteriovenous   . Hernia repair    . Aortagram  07/29/2011    Abdominal Aortagram  . Cardiac catheterization  08/01/11    Left heart catheterization  . Femoral-popliteal bypass graft  09/09/2011    Procedure: BYPASS GRAFT FEMORAL-POPLITEAL ARTERY;  Surgeon: [name], MD;  Location: [facility] OR;  Service: Vascular;  Laterality: Left;  . Amputation  09/12/2011   Procedure: AMPUTATION BELOW KNEE;  Surgeon: [name], MD;  Location: [facility] OR;  Service: Vascular;  Laterality: Left;  TRANSMETATARSAL  . Application of wound vac  10/30/2011    Procedure: APPLICATION OF WOUND VAC;  Surgeon: [name], MD;  Location: [facility] OR;  Service: Vascular;  Laterality: Left;  . Groin debridement  11/01/2011    Procedure: Drucie Ip DEBRIDEMENT;  Surgeon: Chuck Hint, MD;  Location: [facility] OR;  Service: Vascular;  Laterality: Left;  . Esophagogastroduodenoscopy  11/06/2011    Procedure: ESOPHAGOGASTRODUODENOSCOPY (EGD);  Surgeon: Beverley Fiedler, MD;  Location: [facility] ENDOSCOPY;  Service: Gastroenterology;  Laterality: N/A;  . Femoral-popliteal bypass graft  01/02/2012    Procedure: BYPASS GRAFT FEMORAL-POPLITEAL ARTERY;  Surgeon: [name], MD;  Location: [facility] OR;  Service: Vascular;  Laterality: Right;  . Amputation  01/02/2012    Procedure: AMPUTATION RAY;  Surgeon: [name], MD;  Location: [facility] OR;  Service: Vascular;  Laterality: Right;  . Toe amputation Right     5/13  . Exchange of a dialysis catheter N/A 07/15/2012    Procedure: EXCHANGE OF A DIALYSIS CATHETER;  Surgeon: Fransisco Hertz, MD;  Location: [facility] OR;  Service: Vascular;  Laterality: N/A;  . Av fistula placement Left 07/15/2012    Procedure: ARTERIOVENOUS (AV) FISTULA CREATION;  Surgeon: Fransisco Hertz, MD;  Location: [facility] OR;  Service: Vascular;  Laterality: Left;  . Av fistula placement Left 09/07/2012    Procedure: INSERTION OF ARTERIOVENOUS (AV) GORE-TEX GRAFT ARM;  Surgeon: Fransisco Hertz, MD;  Location: [facility] OR;  Service: Vascular;  Laterality: Left;  . Insertion of dialysis catheter Right 09/07/2012    Procedure: INSERTION OF a Femoral DIALYSIS CATHETER;  Surgeon: Fransisco Hertz, MD;  Location: [facility] OR;  Service: Vascular;  Laterality: Right;  . Lipoma excision Left 09/07/2012    Procedure: EXCISION Seroma left arm;  Surgeon: Fransisco Hertz, MD;  Location: [facility] OR;  Service: Vascular;  Laterality: Left;    Family  History  Problem Relation Age of Onset  . Breast cancer Mother   . Cancer Mother   . Cancer Father   . Anesthesia problems Neg Hx   . Hypotension Neg Hx   . Malignant hyperthermia Neg Hx   . Pseudochol deficiency Neg Hx     Social History:  reports that he quit smoking about 2 months ago. His smoking use included Cigarettes. He has a 12 pack-year smoking history. He has never used smokeless tobacco. He reports that he does not drink alcohol or use illicit drugs.  Allergies  Allergen Reactions  . Ambien (Zolpidem Tartrate) Other (See Comments)    Hallucinations    Medications:  I have reviewed the patient's current medications. Prior to Admission:  Prescriptions prior to admission  Medication Sig Dispense Refill  . ALPRAZolam (XANAX) 1 MG tablet Take 1 tablet by mouth daily.      . calcium acetate (PHOSLO) 667 MG capsule Take 1,334-2,001 mg by mouth 3 (three) times daily with meals.       . cinacalcet (SENSIPAR) 60 MG tablet Take 60 mg by mouth daily.      Marland Kitchen FOSRENOL 1000 MG chewable tablet Chew 1,000 mg by mouth 2 (two) times daily with a meal.       . gabapentin (NEURONTIN) 100 MG capsule Take 100 mg by mouth daily.      . hydrALAZINE (APRESOLINE) 25 MG tablet Take 25 mg by mouth 3 (three) times daily.      . isosorbide mononitrate (IMDUR) 30 MG 24 hr tablet Take 30 mg by mouth daily.      . multivitamin (RENA-VIT) TABS tablet Take 1 tablet by mouth daily.      . naproxen sodium (ALEVE) 220 MG tablet Take 440 mg by mouth daily as needed (For headache.).      Marland Kitchen oxyCODONE-acetaminophen (PERCOCET/ROXICET) 5-325 MG per tablet Take 1 tablet by mouth every 6 (six) hours as needed for pain.  30 tablet  0  . sevelamer (RENVELA) 800 MG tablet Take 2 tablets (1,600 mg total) by mouth 3 (three) times daily with meals.  180 tablet  0  . simvastatin (ZOCOR) 5 MG tablet Take 5 mg by mouth at bedtime.      . traMADol (ULTRAM) 50 MG tablet Take 50 mg by mouth every 6 (six) hours as needed for  pain. For pain       Scheduled: . antiseptic oral rinse  15 mL Mouth Rinse q12n4p  . aspirin  81 mg Oral Daily  . atorvastatin  80 mg Oral q1800  . chlorhexidine  15 mL Mouth Rinse BID  . insulin aspart  0-9 Units Subcutaneous Q4H  . lidocaine (cardiac) 100 mg/3ml      . pantoprazole (PROTONIX) IV  40 mg Intravenous Q24H  . piperacillin-tazobactam (ZOSYN)  IV  2.25 g Intravenous Q8H  . rocuronium      . succinylcholine        ROS: Unable to obtain.  Patient intubated.  Physical Examination: Blood pressure 131/59, pulse 87, temperature 99 F (37.2 C), temperature source Axillary, resp. rate 28, height 5\' 6"  (1.676 m), weight 77.8 kg (171 lb 8.3 oz), SpO2 99.00%.  Neurologic Examination Mental Status: Patient opens his eyes when his name is called.  Does not follow commands.  No verbalizations noted but does seem to try to mouth words.  Cranial Nerves: II: patient does not respond confrontation bilaterally, pupils right 4 mm, left 4 mm,and reactive bilaterally III,IV,VI: doll's response present bilaterally. Looks around the room V,VII: Symmetric grimace.    VIII: grossly intact IX,X: gag reflex unable to be tested, XI: trapezius strength unable to test bilaterally XII: tongue strength unable to test Motor: Patient moves all extremities weakly but reasonably equally.  Increased tone on the right Sensory: Responds to light stimuli throughout. Deep Tendon Reflexes:  2+ throughout. Plantars: Unable to test secondary to amputations Cerebellar: Unable to perform    Laboratory Studies:   Basic Metabolic Panel:  Recent Labs Lab 09/07/12 0704 09/07/12 0719 09/08/12 1930 09/09/12 0630  NA 135 135 139 131*  K 6.2* 5.1 5.7* 4.5  CL  --   --  96 92*  CO2  --   --  21 25  GLUCOSE 86 98 31* 142*  BUN  --   --  98* 42*  CREATININE  --   --  14.25* 8.11*  CALCIUM  --   --  9.2 9.3  PHOS  --   --  9.8*  --     Liver Function Tests:  Recent Labs Lab 09/08/12 1930  09/09/12 1344  PROT  --  6.6  ALBUMIN 2.4*  --    No results found for this basename: LIPASE, AMYLASE,  in the last 168 hours No results found for this basename: AMMONIA,  in the last 168 hours  CBC:  Recent Labs Lab 09/07/12 0704 09/07/12 0719 09/09/12 0630  WBC  --   --  8.6  HGB 13.3 12.9* 9.5*  HCT 39.0 38.0* 30.5*  MCV  --   --  86.2  PLT  --   --  152    Cardiac Enzymes:  Recent Labs Lab 09/08/12 1930 09/09/12 0010 09/09/12 0730 09/09/12 1344  TROPONINI 3.16* 4.24* 3.22* 3.43*    BNP: No components found with this basename: POCBNP,   CBG:  Recent Labs Lab 09/09/12 0539 09/09/12 0808 09/09/12 1001 09/09/12 1206 09/09/12 1620  GLUCAP 109* 96 101* 94 120*    Microbiology: Results for orders placed during the hospital encounter of 09/08/12  MRSA PCR SCREENING     Status: None   Collection Time    09/08/12  5:49 PM      Result Value Range Status   MRSA by PCR NEGATIVE  NEGATIVE Final   Comment:            The GeneXpert MRSA Assay (FDA     approved for NASAL specimens     only), is one component of a     comprehensive MRSA colonization     surveillance program. It is not     intended to diagnose MRSA     infection nor to guide or     monitor treatment for     MRSA infections.    Coagulation Studies: No results found for this  basename: LABPROT, INR,  in the last 72 hours  Urinalysis: No results found for this basename: COLORURINE, APPERANCEUR, LABSPEC, PHURINE, GLUCOSEU, HGBUR, BILIRUBINUR, KETONESUR, PROTEINUR, UROBILINOGEN, NITRITE, LEUKOCYTESUR,  in the last 168 hours  Lipid Panel:     Component Value Date/Time   CHOL  Value: 147        ATP III CLASSIFICATION:  <200     mg/dL   Desirable  454-098  mg/dL   Borderline High  >=119    mg/dL   High        03/28/7827 1730   TRIG 72 07/22/2008 1730   HDL 33* 07/22/2008 1730   CHOLHDL 4.5 07/22/2008 1730   VLDL 14 07/22/2008 1730   LDLCALC  Value: 100        Total Cholesterol/HDL:CHD Risk Coronary Heart  Disease Risk Table                     Men   Women  1/2 Average Risk   3.4   3.3  Average Risk       5.0   4.4  2 X Average Risk   9.6   7.1  3 X Average Risk  23.4   11.0        Use the calculated Patient Ratio above and the CHD Risk Table to determine the patient's CHD Risk.        ATP III CLASSIFICATION (LDL):  <100     mg/dL   Optimal  562-130  mg/dL   Near or Above                    Optimal  130-159  mg/dL   Borderline  865-784  mg/dL   High  >696     mg/dL   Very High* 05/02/5282 1324    HgbA1C:  Lab Results  Component Value Date   HGBA1C  Value: 4.3 (NOTE) The ADA recommends the following therapeutic goal for glycemic control related to Hgb A1c measurement: Goal of therapy: <6.5 Hgb A1c  Reference: American Diabetes Association: Clinical Practice Recommendations 2010, Diabetes Care, 2010, 33: (Suppl  1).* 07/22/2008    Urine Drug Screen:   No results found for this basename: labopia, cocainscrnur, labbenz, amphetmu, thcu, labbarb    Alcohol Level: No results found for this basename: ETH,  in the last 168 hours  Other results: EKG: normal sinus rhythm at 85 bpm.  Imaging: Dg Chest Port 1 View  09/09/2012   *RADIOLOGY REPORT*  Clinical Data: Right chest tube  PORTABLE CHEST - 1 VIEW  Comparison: 09/09/2012  Findings: Pigtail drainage catheter has been placed overlying the right lung base.  Decrease in right basilar pneumothorax.  There remains a small right basilar pneumothorax.  Endotracheal tube in good position.  NG tube enters the stomach. Dual lumen femoral catheter enters the right atrium, unchanged.  Progression of diffuse bilateral airspace disease most likely edema.  Right lower lobe consolidation is present  IMPRESSION: Right pigtail drainage catheter has been placed in good position. Decrease in right basilar pneumothorax.  Progression of bilateral edema.   Original Report Authenticated By: Janeece Riggers, M.D.   Dg Chest Port 1 View  09/09/2012   *RADIOLOGY REPORT*  Clinical Data:  Evaluate for pneumothorax  PORTABLE CHEST - 1 VIEW  Comparison: 08/06/2012  Findings: Endotracheal tube tip is above the carina.  There is a nasogastric tube with tip in the stomach. Moderate size right basilar hydropneumothorax appears loculated.  Surrounding atelectatic lung  is noted.  There is mild diffuse interstitial edema.  IMPRESSION:  Moderate sized loculated hydropneumothorax status post thoracentesis.   Original Report Authenticated By: Signa Kell, M.D.   Dg Chest Port 1 View  09/09/2012   *RADIOLOGY REPORT*  Clinical Data: Pulmonary edema, right pleural effusion, and shortness of breath.  PORTABLE CHEST - 1 VIEW 5:22 a.m.  Comparison: 6/19 ( 12:09 am), 6/18, and 09/07/2012  Findings: There is increased bilateral interstitial pulmonary edema.  Right pleural effusion is unchanged.  Heart size is unchanged.  IMPRESSION: Increased bilateral pulmonary edema.  Stable large right pleural effusion.   Original Report Authenticated By: Francene Boyers, M.D.   Dg Chest Port 1 View  09/09/2012   *RADIOLOGY REPORT*  Clinical Data: Central venous line attempt.  Catheter did not pass.  PORTABLE CHEST - 1 VIEW  Comparison: Chest radiograph 09/08/2012 and 09/07/2012.  CT chest 08/06/2012.  Findings: Stable cardiomegaly.  Moderate sized right pleural effusion.  Right perihilar and right basilar lung opacities are unchanged.  Small opacity in the lingula of the left lung could reflect atelectasis or airspace disease.  Negative for pneumothorax.  No visible pleural effusion on the left.  Dialysis catheter via an inferior approach projects over the expected location of the inferior vena cava and terminates in the expected location of the right atrium.  IMPRESSION: No significant change in moderate right pleural effusion and right perihilar and right basilar opacities. No evidence of pneumothorax.   Original Report Authenticated By: Britta Mccreedy, M.D.     Assessment/Plan: 62 year old male with altered mental  status.  He is starting to show some signs of improvement.  Has multiple medical issues that may be contributing including pulmonary edema, pulmonary infiltrates and metabolic issues.  No significant asymmetry on examination at this time.  Patient has been receiving sedation.  On ASA as well.    Recommendations: 1.  Would not recommend any further neurologic intervention at this time.  Will continue to follow with you clinically. Patient may cognitively respond slower than he will otherwise medically.   2.  Agree with continued ASA.    Thana Farr, MD [facility] [phone] 09/09/2012, 6:16 PM

## 2012-09-09 NOTE — Progress Notes (Signed)
Positive troponin called to Dr. [name]. Informed him of pts sugar drop. D5NS added. Will continue to monitor.  Perkins, [place] Elizabeth

## 2012-09-09 NOTE — Progress Notes (Signed)
Utilization review completed. Staphanie Harbison, RN, BSN. 

## 2012-09-09 NOTE — Progress Notes (Signed)
Per Dr Eliott Nine may use HD cath if only iv access.

## 2012-09-09 NOTE — Progress Notes (Signed)
eLink Physician-Brief Progress Note Patient Name: [name] DOB: 30-May-1950 MRN: [id number]  Date of Service  09/09/2012   HPI/Events of Note   Recent Labs Lab 09/09/12 0114 09/09/12 0505  PHART 7.374 7.323*  PCO2ART 52.8* 55.9*  PO2ART 370.0* 66.0*  HCO3 30.6* 29.0*  TCO2 32 31  O2SAT 100.0 91.0      eICU Interventions   ABG shows worsening mild respiratory acidosis. Currently patient is off BiPAP. Instructed nurse to tell respiratory therapist to start BiPAP back    Intervention Category Major Interventions: Acid-Base disturbance - evaluation and management  Naevia Unterreiner 09/09/2012, 5:15 AM

## 2012-09-09 NOTE — Progress Notes (Signed)
Notified CCM of low blood sugar. IV fluids changed to D10.  Perkins, [place] Elizabeth

## 2012-09-09 NOTE — Consult Note (Signed)
WOC consult Note Reason for Consult: sacral pressure ulcer.  Pt with prophylactic sacral dressing in place, meets criteria.  He does have healed pressure ulcer on the sacrum with slight fissure like opening centrally.  Only partial thickness opening, but clearly he has had at least Stage III here in the past Wound type:Healed sacral pressure ulcer Pressure Ulcer POA: Yes Dressing procedure/placement/frequency: continue sacral prophylactic dressing and pressure redistribution mattress.  Re consult if needed, will not follow at this time. Thanks  Bracen Schum [employer], CWOCN [phone])

## 2012-09-09 NOTE — Progress Notes (Signed)
INITIAL NUTRITION ASSESSMENT  DOCUMENTATION CODES Per approved criteria  -Not Applicable   INTERVENTION: 1. If unable to advance diet, recommend initiation of enteral nutrition to meet nutrition needs. If enteral nutrition is warranted, recommend initiate Nepro @ 20 ml/hr and advance by 10 ml q 4 hr to a goal rate of 50 ml/hr. This enteral nutrition regimen would provide 2160 kcal, 97 gm protein, and 872 ml free water.   NUTRITION DIAGNOSIS: Inadequate oral intake related to inability to eat as evidenced by NPO for poor mental status.   Goal: Diet advance  Vs enteral nutrition to meet >/=90% estimated nutrition needs.   Monitor:  Diet advance vs initiation of enteral nutrition, skin breakdown, weight trends, labs   Reason for Assessment: Low Braden Score   62 y.o. male  Admitting Dx: Acute encephalopathy  ASSESSMENT: Pt transferred from [facility] hospital for emergent HD. Pt was taken to [facility] ED with change in mental status, found to have fever and hyperkalemia. With hypoxemia related to chronic R effusion and infiltrate. Pt currently undergoing thoracentesis at bedside.  S/p access procedure with ligation of nonmaturing LUA BVT, placement of new LUA AV graft and placement of new femoral tunneled HD cath on 6/17.  Pt continues to be NPO related to mental status. If unable to advance diet in the next days, recommend initiation of enteral nutrition. Pt with old sacral wound, at increased risk for additional skin breakdown with poor mental status and no nutrition intake.   Height: Ht Readings from Last 1 Encounters:  09/08/12 5\' 6"  (1.676 m)    Weight: Wt Readings from Last 1 Encounters:  09/09/12 172 lb 9.9 oz (78.3 kg)    Ideal Body Weight: 135 lbs adj for BKA  % Ideal Body Weight: 127%  Wt Readings from Last 10 Encounters:  09/09/12 172 lb 9.9 oz (78.3 kg)  08/18/12 154 lb (69.854 kg)  08/06/12 154 lb (69.854 kg)  08/05/12 153 lb (69.4 kg)  07/28/12 149 lb (67.586 kg)   07/14/12 140 lb (63.504 kg)  07/14/12 140 lb (63.504 kg)  06/30/12 145 lb (65.772 kg)  06/30/12 145 lb (65.772 kg)  06/21/12 154 lb (69.854 kg)    Usual Body Weight: ~154 lbs   % Usual Body Weight: 111%  BMI:  29.3 kg/(m^2), adj for BKA,  Overweight   Estimated Nutritional Needs: Kcal: 2100-2300 Protein: >/= 84 gm  Fluid: urin output + 1000 CC  Skin: healing sacral PU, reviewed WOC notes.   Diet Order: NPO  EDUCATION NEEDS: -Education not appropriate at this time   Intake/Output Summary (Last 24 hours) at 09/09/12 1111 Last data filed at 09/09/12 0900  Gross per 24 hour  Intake    861 ml  Output   -930 ml  Net   1791 ml    Last BM: PTA    Labs:   Recent Labs Lab 09/07/12 0719 09/08/12 1930 09/09/12 0630  NA 135 139 131*  K 5.1 5.7* 4.5  CL  --  96 92*  CO2  --  21 25  BUN  --  98* 42*  CREATININE  --  14.25* 8.11*  CALCIUM  --  9.2 9.3  PHOS  --  9.8*  --   GLUCOSE 98 31* 142*    CBG (last 3)   Recent Labs  09/09/12 0539 09/09/12 0808 09/09/12 1001  GLUCAP 109* 96 101*    Scheduled Meds: . antiseptic oral rinse  15 mL Mouth Rinse q12n4p  . aspirin  81  mg Oral Daily  . atorvastatin  80 mg Oral q1800  . chlorhexidine  15 mL Mouth Rinse BID  . insulin aspart  0-9 Units Subcutaneous Q4H  . pantoprazole (PROTONIX) IV  40 mg Intravenous Q24H  . piperacillin-tazobactam (ZOSYN)  IV  2.25 g Intravenous Q8H  . vancomycin  1,500 mg Intravenous Once    Continuous Infusions: . sodium chloride 10 mL/hr at 09/08/12 1930  . dextrose 75 mL/hr at 09/09/12 0400  . dextrose 5 % and 0.9% NaCl 30 mL/hr at 09/08/12 2202  . heparin 900 Units/hr (09/09/12 0028)    Past Medical History  Diagnosis Date  . Hyperlipidemia   . ESRD (end stage renal disease)     a. MWF dialysis in [place] (followed by Dr. Kristian Covey)  . Arthritis   . Claudication   . Stroke 2005  . Anemia   . Chronic diastolic CHF (congestive heart failure) 07/2010    a. 07/2008 Echo EF  50-55%, mild-mod LVH, Gr 1 DD.  Marland Kitchen Peripheral vascular occlusive disease     a. 07/2011 Periph Angio: No signif Ao-illiac dzs, LCF stenosis, 100% LSFA w recon above knee pop and 3 vessel runoff, 100% RSFA w/ recon above knee --> pending L Fem to below Knee pop bypass.  Marland Kitchen History of tobacco abuse   . Hypertension     takes Amlodipine,Metoprolol,and Prinivil daily  . Dyspnea on exertion     with exertion  . GERD (gastroesophageal reflux disease)   . Cataract     bilateral  . Dialysis patient   . PUD (peptic ulcer disease) mid 1990's    with GI bleed.  endoscoped in mid 1990's at [facility]    Past Surgical History  Procedure Laterality Date  . Arteriovenous graft placement    . Av fistula placement  12/10/2000    Right brachiocephalic arteriovenous   . Hernia repair    . Aortagram  07/29/2011    Abdominal Aortagram  . Cardiac catheterization  08/01/11    Left heart catheterization  . Femoral-popliteal bypass graft  09/09/2011    Procedure: BYPASS GRAFT FEMORAL-POPLITEAL ARTERY;  Surgeon: [name], MD;  Location: [facility] OR;  Service: Vascular;  Laterality: Left;  . Amputation  09/12/2011    Procedure: AMPUTATION BELOW KNEE;  Surgeon: [name], MD;  Location: [facility] OR;  Service: Vascular;  Laterality: Left;  TRANSMETATARSAL  . Application of wound vac  10/30/2011    Procedure: APPLICATION OF WOUND VAC;  Surgeon: [name], MD;  Location: [facility] OR;  Service: Vascular;  Laterality: Left;  . Groin debridement  11/01/2011    Procedure: Drucie Ip DEBRIDEMENT;  Surgeon: Chuck Hint, MD;  Location: [facility] OR;  Service: Vascular;  Laterality: Left;  . Esophagogastroduodenoscopy  11/06/2011    Procedure: ESOPHAGOGASTRODUODENOSCOPY (EGD);  Surgeon: Beverley Fiedler, MD;  Location: [facility] ENDOSCOPY;  Service: Gastroenterology;  Laterality: N/A;  . Femoral-popliteal bypass graft  01/02/2012    Procedure: BYPASS GRAFT FEMORAL-POPLITEAL ARTERY;  Surgeon: [name], MD;  Location: [facility] OR;   Service: Vascular;  Laterality: Right;  . Amputation  01/02/2012    Procedure: AMPUTATION RAY;  Surgeon: [name], MD;  Location: [facility] OR;  Service: Vascular;  Laterality: Right;  . Toe amputation Right     5/13  . Exchange of a dialysis catheter N/A 07/15/2012    Procedure: EXCHANGE OF A DIALYSIS CATHETER;  Surgeon: Fransisco Hertz, MD;  Location: [facility] OR;  Service: Vascular;  Laterality: N/A;  .  Av fistula placement Left 07/15/2012    Procedure: ARTERIOVENOUS (AV) FISTULA CREATION;  Surgeon: Fransisco Hertz, MD;  Location[facility] OR;  Service: Vascular;  Laterality: Left;    Clarene Duke RD, LDN Pager [phone] After Hours pager [phone]

## 2012-09-09 NOTE — Progress Notes (Signed)
SUBJECTIVE:  Unable to respond to questions.  Opens eyes   PHYSICAL EXAM Filed Vitals:   09/09/12 0530 09/09/12 0600 09/09/12 0630 09/09/12 0700  BP: 117/31 135/48 101/33 112/43  Pulse:  87    Temp:      TempSrc:      Resp: 8 9 10 12   Height:      Weight:      SpO2:  99%     General:  Responsive minimally Lungs:  Diffuse wheezing Heart:  RRR Abdomen:  Positive bowel sounds, no rebound no guarding Extremities:  Diffuse mild edema.   LABS: Lab Results  Component Value Date   TROPONINI 4.24* 09/09/2012   Results for orders placed during the hospital encounter of 09/08/12 (from the past 24 hour(s))  MRSA PCR SCREENING     Status: None   Collection Time    09/08/12  5:49 PM      Result Value Range   MRSA by PCR NEGATIVE  NEGATIVE  RENAL FUNCTION PANEL     Status: Abnormal   Collection Time    09/08/12  7:30 PM      Result Value Range   Sodium 139  135 - 145 mEq/L   Potassium 5.7 (*) 3.5 - 5.1 mEq/L   Chloride 96  96 - 112 mEq/L   CO2 21  19 - 32 mEq/L   Glucose, Bld 31 (*) 70 - 99 mg/dL   BUN 98 (*) 6 - 23 mg/dL   Creatinine, Ser 47.82 (*) 0.50 - 1.35 mg/dL   Calcium 9.2  8.4 - 95.6 mg/dL   Phosphorus 9.8 (*) 2.3 - 4.6 mg/dL   Albumin 2.4 (*) 3.5 - 5.2 g/dL   GFR calc non Af Amer 3 (*) >90 mL/min   GFR calc Af Amer 4 (*) >90 mL/min  TROPONIN I     Status: Abnormal   Collection Time    09/08/12  7:30 PM      Result Value Range   Troponin I 3.16 (*) <0.30 ng/mL  GLUCOSE, CAPILLARY     Status: Abnormal   Collection Time    09/08/12  7:38 PM      Result Value Range   Glucose-Capillary 36 (*) 70 - 99 mg/dL   Comment 1 Repeat Test    GLUCOSE, CAPILLARY     Status: Abnormal   Collection Time    09/08/12  7:40 PM      Result Value Range   Glucose-Capillary 30 (*) 70 - 99 mg/dL  GLUCOSE, CAPILLARY     Status: Abnormal   Collection Time    09/08/12  8:04 PM      Result Value Range   Glucose-Capillary 55 (*) 70 - 99 mg/dL  GLUCOSE, CAPILLARY     Status: None   Collection Time    09/08/12  8:29 PM      Result Value Range   Glucose-Capillary 93  70 - 99 mg/dL  HEPATITIS B SURFACE ANTIGEN     Status: None   Collection Time    09/09/12 12:00 AM      Result Value Range   Hepatitis B Surface Ag NEGATIVE  NEGATIVE  GLUCOSE, CAPILLARY     Status: Abnormal   Collection Time    09/09/12 12:02 AM      Result Value Range   Glucose-Capillary <10 (*) 70 - 99 mg/dL  TROPONIN I     Status: Abnormal   Collection Time    09/09/12 12:10 AM  Result Value Range   Troponin I 4.24 (*) <0.30 ng/mL  GLUCOSE, CAPILLARY     Status: Abnormal   Collection Time    09/09/12 12:21 AM      Result Value Range   Glucose-Capillary 44 (*) 70 - 99 mg/dL  GLUCOSE, CAPILLARY     Status: Abnormal   Collection Time    09/09/12 12:41 AM      Result Value Range   Glucose-Capillary 54 (*) 70 - 99 mg/dL  GLUCOSE, CAPILLARY     Status: Abnormal   Collection Time    09/09/12  1:00 AM      Result Value Range   Glucose-Capillary 134 (*) 70 - 99 mg/dL  POCT I-STAT 3, BLOOD GAS (G3+)     Status: Abnormal   Collection Time    09/09/12  1:14 AM      Result Value Range   pH, Arterial 7.374  7.350 - 7.450   pCO2 arterial 52.8 (*) 35.0 - 45.0 mmHg   pO2, Arterial 370.0 (*) 80.0 - 100.0 mmHg   Bicarbonate 30.6 (*) 20.0 - 24.0 mEq/L   TCO2 32  0 - 100 mmol/L   O2 Saturation 100.0     Acid-Base Excess 5.0 (*) 0.0 - 2.0 mmol/L   Patient temperature 99.5 F     Sample type ARTERIAL    GLUCOSE, CAPILLARY     Status: None   Collection Time    09/09/12  2:05 AM      Result Value Range   Glucose-Capillary 71  70 - 99 mg/dL  GLUCOSE, CAPILLARY     Status: Abnormal   Collection Time    09/09/12  2:45 AM      Result Value Range   Glucose-Capillary 40 (*) 70 - 99 mg/dL  GLUCOSE, CAPILLARY     Status: Abnormal   Collection Time    09/09/12  3:07 AM      Result Value Range   Glucose-Capillary 40 (*) 70 - 99 mg/dL  GLUCOSE, CAPILLARY     Status: None   Collection Time     09/09/12  3:09 AM      Result Value Range   Glucose-Capillary 91  70 - 99 mg/dL  GLUCOSE, CAPILLARY     Status: Abnormal   Collection Time    09/09/12  3:11 AM      Result Value Range   Glucose-Capillary 133 (*) 70 - 99 mg/dL  GLUCOSE, CAPILLARY     Status: Abnormal   Collection Time    09/09/12  3:56 AM      Result Value Range   Glucose-Capillary 135 (*) 70 - 99 mg/dL  POCT I-STAT 3, BLOOD GAS (G3+)     Status: Abnormal   Collection Time    09/09/12  5:05 AM      Result Value Range   pH, Arterial 7.323 (*) 7.350 - 7.450   pCO2 arterial 55.9 (*) 35.0 - 45.0 mmHg   pO2, Arterial 66.0 (*) 80.0 - 100.0 mmHg   Bicarbonate 29.0 (*) 20.0 - 24.0 mEq/L   TCO2 31  0 - 100 mmol/L   O2 Saturation 91.0     Acid-Base Excess 2.0  0.0 - 2.0 mmol/L   Patient temperature 98.6 F     Collection site RADIAL, ALLEN'S TEST ACCEPTABLE     Drawn by RT     Sample type ARTERIAL      Intake/Output Summary (Last 24 hours) at 09/09/12 0731 Last data filed at 09/09/12 0700  Gross per 24 hour  Intake    684 ml  Output   -930 ml  Net   1614 ml    EKG:  NSR, rate 85, LVH, lateral T wave inversion, consider ischemia.  T wave changes perhaps more pronounced.  09-25-12  ASSESSMENT AND PLAN:  Elevated troponin:  Repeat troponin to see if trending down. The patient is still unable to give any symptoms.  Echo pending.  Continue heparin and ASA.  I suspect that we will be pursuing conservative management.    Calel Pisarski 25-Sep-2012 7:31 AM

## 2012-09-09 NOTE — Progress Notes (Signed)
[place] rt chest performed to localise site for thoracentesis. Pleural fluid seen as echo free space bordered by chest wall, atelectatic lung & right diaphragm. Post thoracentesis US showed sliding lung anteriorly.  ALVA,RAKESH V.

## 2012-09-09 NOTE — Progress Notes (Signed)
Neither pt nor wife know what meds pt is on. Wife and granddaughter asked to bring med list in.  Perkins, [place] Elizabeth

## 2012-09-09 NOTE — Procedures (Signed)
Chest Tube Insertion Procedure Note  Indications:  Clinically significant   Pre-operative Diagnosis: Rt Pnx, ex vacuo following rt thoracentesis  Post-operative Diagnosis: Same  Procedure Details  Informed consent was obtained for the procedure, including sedation.  Risks of lung perforation, hemorrhage, arrhythmia, and adverse drug reaction were discussed.   After sterile skin prep, using standard technique, a 14 French tube was placed in the right lateral 6 rib space. Wayne pnx kit was used.  Findings: None  Estimated Blood Loss:  less than 50 mL         Specimens:  None              Complications:  None; patient tolerated the procedure well.         Disposition: ICU - intubated and hemodynamically stable.         Condition: stable  Brett Canales Minor ACNP Adolph Pollack PCCM Pager [phone] till 3 pm If no answer page [phone] 09/09/2012, 1:02 PM  Attending Attestation: I was present and scrubbed for the entire procedure.  Teven Mittman V.

## 2012-09-09 NOTE — Progress Notes (Addendum)
RT IJ attempted but unable to pass guidewire - note prior access issues. Rt thoracentesis performed. He was then intubated & sedated, vent orders written. CXR showed pnthx & rt wayne chest tube was inserted. Sedation/ vent orders written NS 250 bolus given for transient hypotension. Wife Zollie Scale updated , consents obtained prior to all procedures  OK to dc heparin , since risk > benefit in his situation, also multiple procedures beingperformed Addn CC time independent of procedures x 19m  ALVA,RAKESH V.

## 2012-09-09 NOTE — Procedures (Signed)
Central Venous Catheter Insertion Procedure Note [name] [id number] [birth year]  Procedure: Insertion of Central Venous Catheter Indications: Assessment of intravascular volume  Procedure Details Consent: Risks of procedure as well as the alternatives and risks of each were explained to the (patient/caregiver).  Consent for procedure obtained. Time Out: Verified patient identification, verified procedure, site/side was marked, verified correct patient position, special equipment/implants available, medications/allergies/relevent history reviewed, required imaging and test results available.  Performed  Maximum sterile technique was used including antiseptics, cap, gloves, gown, hand hygiene, mask and sheet. Skin prep: Chlorhexidine; local anesthetic administered A antimicrobial bonded/coated triple lumen catheter was placed in the right internal jugular vein using the Seldinger technique. Ultrasound guidance used.no Catheter placed to 0 cm. Unable to thread wire.  Evaluation  Chest X-ray ordered to verify no complication of attempted cvl.  CXR: pending.  Brett Canales Minor ACNP Adolph Pollack PCCM Pager [phone] till 3 pm If no answer page [phone]  Ultrasound used for site verification, live visualisation of needle entry & guidewire prior to dilation  Kenyon Eichelberger V.  09/09/2012, 11:36 AM

## 2012-09-10 ENCOUNTER — Ambulatory Visit: Payer: Medicare Other | Admitting: Vascular Surgery

## 2012-09-10 ENCOUNTER — Inpatient Hospital Stay (HOSPITAL_COMMUNITY): Payer: Medicare Other

## 2012-09-10 DIAGNOSIS — E162 Hypoglycemia, unspecified: Secondary | ICD-10-CM

## 2012-09-10 DIAGNOSIS — I509 Heart failure, unspecified: Secondary | ICD-10-CM

## 2012-09-10 DIAGNOSIS — G9341 Metabolic encephalopathy: Secondary | ICD-10-CM

## 2012-09-10 DIAGNOSIS — I2510 Atherosclerotic heart disease of native coronary artery without angina pectoris: Secondary | ICD-10-CM

## 2012-09-10 DIAGNOSIS — I5032 Chronic diastolic (congestive) heart failure: Secondary | ICD-10-CM

## 2012-09-10 LAB — CBC
MCH: 26 pg (ref 26.0–34.0)
MCHC: 30.9 g/dL (ref 30.0–36.0)
MCV: 84.2 fL (ref 78.0–100.0)
Platelets: 135 10*3/uL — ABNORMAL LOW (ref 150–400)
RBC: 3.11 MIL/uL — ABNORMAL LOW (ref 4.22–5.81)
RDW: 19.5 % — ABNORMAL HIGH (ref 11.5–15.5)

## 2012-09-10 LAB — GLUCOSE, CAPILLARY
Glucose-Capillary: 120 mg/dL — ABNORMAL HIGH (ref 70–99)
Glucose-Capillary: 117 mg/dL — ABNORMAL HIGH (ref 70–99)
Glucose-Capillary: 130 mg/dL — ABNORMAL HIGH (ref 70–99)
Glucose-Capillary: 95 mg/dL (ref 70–99)

## 2012-09-10 LAB — POCT I-STAT 3, ART BLOOD GAS (G3+)
Acid-Base Excess: 6 mmol/L — ABNORMAL HIGH (ref 0.0–2.0)
Bicarbonate: 28.6 mEq/L — ABNORMAL HIGH (ref 20.0–24.0)
O2 Saturation: 99 %
TCO2: 30 mmol/L (ref 0–100)
pCO2 arterial: 34.1 mmHg — ABNORMAL LOW (ref 35.0–45.0)
pO2, Arterial: 144 mmHg — ABNORMAL HIGH (ref 80.0–100.0)

## 2012-09-10 LAB — PH, BODY FLUID: pH, Fluid: 8

## 2012-09-10 LAB — RENAL FUNCTION PANEL
CO2: 27 mEq/L (ref 19–32)
Calcium: 9.3 mg/dL (ref 8.4–10.5)
Creatinine, Ser: 5.64 mg/dL — ABNORMAL HIGH (ref 0.50–1.35)
Glucose, Bld: 121 mg/dL — ABNORMAL HIGH (ref 70–99)
Phosphorus: 4.5 mg/dL (ref 2.3–4.6)

## 2012-09-10 MED ORDER — DEXTROSE 50 % IV SOLN
INTRAVENOUS | Status: AC
  Filled 2012-09-10: qty 50

## 2012-09-10 MED ORDER — ACETAMINOPHEN 160 MG/5ML PO SOLN
650.0000 mg | Freq: Four times a day (QID) | ORAL | Status: DC | PRN
  Administered 2012-09-10 – 2012-09-11 (×3): 650 mg
  Filled 2012-09-10 (×4): qty 20.3

## 2012-09-10 MED ORDER — DARBEPOETIN ALFA-POLYSORBATE 100 MCG/0.5ML IJ SOLN
100.0000 ug | INTRAMUSCULAR | Status: AC
  Filled 2012-09-10: qty 0.5

## 2012-09-10 MED ORDER — VANCOMYCIN HCL IN DEXTROSE 750-5 MG/150ML-% IV SOLN: 750.0000 mg | INTRAVENOUS | Status: DC

## 2012-09-10 MED ORDER — DEXTROSE 50 % IV SOLN
25.0000 mL | Freq: Once | INTRAVENOUS | Status: AC | PRN
  Administered 2012-09-10: 25 mL via INTRAVENOUS

## 2012-09-10 MED ORDER — VANCOMYCIN HCL IN DEXTROSE 750-5 MG/150ML-% IV SOLN
750.0000 mg | Freq: Once | INTRAVENOUS | Status: DC
  Filled 2012-09-10: qty 150

## 2012-09-10 MED ORDER — DARBEPOETIN ALFA-POLYSORBATE 100 MCG/0.5ML IJ SOLN: 100.0000 ug | INTRAMUSCULAR | Status: DC

## 2012-09-10 NOTE — Progress Notes (Signed)
PULMONARY  / CRITICAL CARE MEDICINE  Name: [name] MRN: [id number] DOB: Jul 19, 1950    ADMISSION DATE:  09/08/2012  REFERRING MD :  ER  CHIEF COMPLAINT: Change in mental status  BRIEF PATIENT DESCRIPTION:  62 yo male brought to ED by family with change in mental status.  Noted to have fever, and hyperkalemia.  Transferred from [facility] to [facility] for emergent HD. ABG was 7.33/49/68 on 3 liters.  UDS was negative.  Lactic acid was 1.9.  Troponin was 3.92.  ON 6/17 he had an access procedure with ligation of nonmaturing LUA BVT, placement new LUA AV graft and placement of new R femoral tunneled HD cath.     SIGNIFICANT EVENTS: 6/19 hypercarbic resp failure on bipap  STUDIES:  6/18 CT head [facility]) >>progressive microvascular ischemic changes, old Rt parietal and Lt cerebellar infarcts  LINES / TUBES: Rt femoral HD cath >> LEJ 6/18 >> ETT 6/19 >>  R chest tube 6/19 >>   CULTURES: Blood 6/18 >> Resp 6/19 >>  R pleural fluid 6/19 >>   ANTIBIOTICS: Levaquin 6/18 >> 6/18 Vancomycin 6/18 >>  Zosyn 6/18 >>   SUBJECTIVE:  Tolerating PSV this am Awake and denies discomfort S/p HD yesterday  VITAL SIGNS: Temp:  [98.8 F (37.1 C)-102 F (38.9 C)] 98.8 F (37.1 C) (06/20 0730) Pulse Rate:  [76-91] 85 (06/20 1000) Resp:  [0-33] 10 (06/20 1000) BP: (67-151)/(23-123) 122/29 mmHg (06/20 1000) SpO2:  [94 %-100 %] 97 % (06/20 1000) FiO2 (%):  [30 %-100 %] 30 % (06/20 0933) Weight:  [76.4 kg (168 lb 6.9 oz)-77.8 kg (171 lb 8.3 oz)] 76.4 kg (168 lb 6.9 oz) (06/20 0600) HEMODYNAMICS:   VENTILATOR SETTINGS: Vent Mode:  [-] CPAP FiO2 (%):  [30 %-100 %] 30 % Set Rate:  [12 bmp-16 bmp] 12 bmp Vt Set:  [500 mL] 500 mL PEEP:  [5 cmH20] 5 cmH20 Pressure Support:  [5 cmH20-10 cmH20] 10 cmH20 Plateau Pressure:  [22 cmH20-30 cmH20] 22 cmH20 INTAKE / OUTPUT: Intake/Output     06/19 0701 - 06/20 0700 06/20 0701 - 06/21 0700   I.V. (mL/kg) 1809 (23.7) 225 (2.9)   IV Piggyback  800 50   Total Intake(mL/kg) 2609 (34.1) 275 (3.6)   Other 2000    Chest Tube 290    Total Output 2290     Net +319 +275          PHYSICAL EXAMINATION: General: Ill appearing Neuro: Wide awake, follows commands, decreased movement of LUE, LEs 3/5, RUE 4/5 HEENT: Pupils reactive, dry oral mucosa, poor dentition Cardiovascular: regular, no murmur Lungs:  Decreased breath sounds, scattered rhonchi, absent breath sounds Rt base Abdomen:  Soft, decreased bowel sounds, non tender Musculoskeletal:  Lt and Rt TMA, no edema, AV graft Lt arm, HD cath Rt femoral region Skin: dry gangrene Rt foot  LABS:  Recent Labs Lab 09/07/12 0719 09/09/12 0630 09/10/12 0425  HGB 12.9* 9.5* 8.1*  HCT 38.0* 30.5* 26.2*  WBC  --  8.6 6.7  PLT  --  152 135*    Recent Labs Lab 09/07/12 0704 09/07/12 0719 09/08/12 1930 09/09/12 0630 09/10/12 0425  NA 135 135 139 131* 135  K 6.2* 5.1 5.7* 4.5 3.4*  CL  --   --  96 92* 93*  CO2  --   --  21 25 27   GLUCOSE 86 98 31* 142* 121*  BUN  --   --  98* 42* 21  CREATININE  --   --  14.25* 8.11* 5.64*  CALCIUM  --   --  9.2 9.3 9.3  PHOS  --   --  9.8*  --  4.5    Recent Labs Lab 09/08/12 1930 09/09/12 1344 09/10/12 0425  PROT  --  6.6  --   ALBUMIN 2.4*  --  2.1*    Recent Labs Lab 09/09/12 0114 09/09/12 0505 09/09/12 1319 09/10/12 0338  PHART 7.374 7.323* 7.333* 7.537*  PCO2ART 52.8* 55.9* 54.2* 34.1*  PO2ART 370.0* 66.0* 308.0* 144.0*  HCO3 30.6* 29.0* 28.7* 28.6*  TCO2 32 31 30 30   O2SAT 100.0 91.0 100.0 99.0     Recent Labs Lab 09/09/12 2233 09/10/12 0034 09/10/12 0112 09/10/12 0406 09/10/12 0732  GLUCAP 99 55* 88 120* 117*    CXR: 6/19 >> effusion improved, continued R atx and chest tube with PTX ex vacuo  ASSESSMENT / PLAN:  PULMONARY A: Hypoxemia with progressive chronic Rt effusion and B infiltrates.  Likely also has some degree of hypoventilation. Thoracentesis from 08/15/11 >> protein 3.2, LDH 155, WBC 214,  reactive mesothelial cells. P:   -assess for possible extubation 6/20, need to see CXR prior to extubation -Chest tube to -20cm H2O  CARDIOVASCULAR A: Elevated cardiac enzymes >> uncertain significance in setting of renal failure. Hx of PVD, HTN, Diastolic heart failure. Hypotension >> ? How accurate BP readings are with cuff on leg - improved P:  -fluid management using HD -probable CAD, w/u to unfold once acute events resolve, appreciate cardiology's help -IV heparin d/c'd   RENAL A:  ESRD. Hyperkalemia, resolved P:   -HD per renal -electrolyte replacement as indicated  GASTROINTESTINAL A:  Nutrition. Hx of PUD. P:   -NPO -Protonix for SUP  HEMATOLOGIC A:  No acute issues. P:  -f/u CBC -SQ heparin for DVT prevention  INFECTIOUS A:  Fever, progressive Rt infiltrate/effusion, healed sacral decubitus ulcer. P:   -D3 vancomycin, zosyn -Await blood cultures -appreciate wound care consult  ENDOCRINE A: Hypoglycemia.  Suspect related to poor insulin clearance, renal status P:   -serial CBG's -D10 started, attempt to wean to off as CBG's improve  NEUROLOGIC A:  Acute encephalopathy likely 2nd to uremia, metabolic status Hx of CVA. P:   -appreciate Neuro input, no further w/u at this time given resolution with treatment metabolic issues  Care during the described time interval was provided by me and/or other providers on the critical care team.  I have reviewed this patient's available data, including medical history, events of note, physical examination and test results as part of my evaluation  CC time x 45 min  Levy Pupa, MD, PhD 09/10/2012, 10:19 AM [facility] [phone] or if no answer [phone]

## 2012-09-10 NOTE — Progress Notes (Signed)
Patient Name: [name] Date of Encounter: 09/10/2012  Principal Problem:   Acute encephalopathy Active Problems:   End stage renal disease   Pleural effusion   Pneumonia   Hyperkalemia   Elevated troponin   Acute respiratory failure with hypercapnia    SUBJECTIVE: Awake on vent, weaning. Denies chest pain, SOB is better.   OBJECTIVE Filed Vitals:   09/10/12 0600 09/10/12 0720 09/10/12 0725 09/10/12 0730  BP: 131/32 95/33 95/33    Pulse: 85 76 80   Temp:    98.8 F (37.1 C)  TempSrc:    Oral  Resp: 23 21 23    Height:      Weight: 168 lb 6.9 oz (76.4 kg)     SpO2: 100% 98% 94%     Intake/Output Summary (Last 24 hours) at 09/10/12 0743 Last data filed at 09/10/12 0600  Gross per 24 hour  Intake   2534 ml  Output   2290 ml  Net    244 ml   Filed Weights   09/09/12 0451 09/09/12 1430 09/10/12 0600  Weight: 172 lb 9.9 oz (78.3 kg) 171 lb 8.3 oz (77.8 kg) 168 lb 6.9 oz (76.4 kg)    PHYSICAL EXAM General: Well developed, well nourished, male in no acute distress. Head: Normocephalic, atraumatic.  Neck: Supple without bruits, JVD slightly elevated, difficult to assess due to equipment. Lungs:  Resp regular and unlabored, CTA anteriorly. Heart: RRR, S1, S2, no S3, S4, 2/6 murmur; no rub. Abdomen: Firm, non-tender, non-distended, BS + x 4.  Extremities: No clubbing, cyanosis, no edema.  Neuro: Alert and oriented X 2. Moves all extremities spontaneously. Psych: Normal affect.  LABS: CBC: Recent Labs  09/09/12 0630 09/10/12 0425  WBC 8.6 6.7  HGB 9.5* 8.1*  HCT 30.5* 26.2*  MCV 86.2 84.2  PLT 152 135*   INR:No results found for this basename: INR,  in the last 72 hours Basic Metabolic Panel: Recent Labs  09/08/12 1930 09/09/12 0630 09/10/12 0425  NA 139 131* 135  K 5.7* 4.5 3.4*  CL 96 92* 93*  CO2 21 25 27   GLUCOSE 31* 142* 121*  BUN 98* 42* 21  CREATININE 14.25* 8.11* 5.64*  CALCIUM 9.2 9.3 9.3  PHOS 9.8*  --  4.5   Liver Function  Tests: Recent Labs  09/08/12 1930 09/09/12 1344 09/10/12 0425  PROT  --  6.6  --   ALBUMIN 2.4*  --  2.1*   Cardiac Enzymes: Recent Labs  09/09/12 0730 09/09/12 1344 09/10/12 0425  TROPONINI 3.22* 3.43* 4.26*    Recent Labs Lab 09/09/12 1344 09/10/12 0425  NA  --  135  K  --  3.4*  CL  --  93*  CO2  --  27  BUN  --  21  CREATININE  --  5.64*  CALCIUM  --  9.3  PROT 6.6  --   GLUCOSE  --  121*   BNP: Pro B Natriuretic peptide (BNP)  Date/Time Value Range Status  08/28/2011  5:40 AM 21860.0* 0 - 125 pg/mL Final  08/27/2011  9:30 AM 19652.0* 0 - 125 pg/mL Final   TELE:        ECG: 09-Sep-2012 06:57:25 Normal sinus rhythm Left ventricular hypertrophy with repolarization abnormality ST depression, consider lateral ischemia When compared with ECG of 09/08/12 ST depresion is more pronounced Vent. rate 85 BPM PR interval 170 ms QRS duration 110 ms QT/QTc 400/476 ms P-R-T axes 21 14 220  Radiology/Studies: Dg Chest Port 1  View 09/09/2012   *RADIOLOGY REPORT*  Clinical Data: Right chest tube  PORTABLE CHEST - 1 VIEW  Comparison: 09/09/2012  Findings: Pigtail drainage catheter has been placed overlying the right lung base.  Decrease in right basilar pneumothorax.  There remains a small right basilar pneumothorax.  Endotracheal tube in good position.  NG tube enters the stomach. Dual lumen femoral catheter enters the right atrium, unchanged.  Progression of diffuse bilateral airspace disease most likely edema.  Right lower lobe consolidation is present  IMPRESSION: Right pigtail drainage catheter has been placed in good position. Decrease in right basilar pneumothorax.  Progression of bilateral edema.   Original Report Authenticated By: Janeece Riggers, M.D.   Dg Chest Port 1 View 09/09/2012   *RADIOLOGY REPORT*  Clinical Data: Evaluate for pneumothorax  PORTABLE CHEST - 1 VIEW  Comparison: 08/06/2012  Findings: Endotracheal tube tip is above the carina.  There is a nasogastric tube  with tip in the stomach. Moderate size right basilar hydropneumothorax appears loculated.  Surrounding atelectatic lung is noted.  There is mild diffuse interstitial edema.  IMPRESSION:  Moderate sized loculated hydropneumothorax status post thoracentesis.   Original Report Authenticated By: Signa Kell, M.D.    Current Medications:  . antiseptic oral rinse  15 mL Mouth Rinse q12n4p  . aspirin  81 mg Oral Daily  . atorvastatin  80 mg Oral q1800  . chlorhexidine  15 mL Mouth Rinse BID  . insulin aspart  0-9 Units Subcutaneous Q4H  . pantoprazole (PROTONIX) IV  40 mg Intravenous Q24H  . piperacillin-tazobactam (ZOSYN)  IV  2.25 g Intravenous Q8H   . sodium chloride 10 mL/hr at 09/08/12 1930  . dextrose 75 mL/hr at 09/09/12 1900  . dextrose 5 % and 0.9% NaCl 30 mL/hr at 09/08/12 2202    ASSESSMENT AND PLAN: 62 yo male with a history of CAD and PAF was sent to [facility] 6/18 after he missed HD last week x 1 and then had AMS/somnolence at HD 6/18. Pt with fever, hyper K+. Treated for electrolyte abnormalities including hypoglycemia and possible infection. MS worsened and pt intubated  Cardiac enzymes elevated (Hx subtotal RCA by cath May 2013, 50% dist LM and ostial LAD) and cardiology following.    Elevated troponin - NSTEMI by enzymes. No history of chest pain. Echo pending. Volume mgt by HD. EF 50% previously, repeat echo pending. MD advise on medical management with ASA, (no statin since NPO, no BB since BP borderline) vs cath possibly Monday to re-assess anatomy.   Otherwise, per CCM, Nephrology Principal Problem:   Acute encephalopathy Active Problems:   End stage renal disease   Pleural effusion   Pneumonia   Hyperkalemia   Acute respiratory failure with hypercapnia   Signed, Theodore Demark , PA-C 7:43 AM 09/10/2012  I have personally seen and examined this patient with Theodore Demark, PA-C. I agree with the assessment and plan as outlined above. Troponin flat in setting of ESRD but  not rising. No chest pain. Will need cath before discharge. We can plan this for Monday if stable. Weekend team with follow. Continue ASA. Echo pending today.   MCALHANY,CHRISTOPHER 8:35 AM 09/10/2012

## 2012-09-10 NOTE — Progress Notes (Signed)
Subjective: Patient much improved today.  Awake and alert.    Objective: Current vital signs: BP 105/21  Pulse 73  Temp(Src) 99.3 F (37.4 C) (Oral)  Resp 12  Ht 5\' 6"  (1.676 m)  Wt 76.4 kg (168 lb 6.9 oz)  BMI 27.2 kg/m2  SpO2 99% Vital signs in last 24 hours: Temp:  [98.8 F (37.1 C)-102 F (38.9 C)] 99.3 F (37.4 C) (06/20 1130) Pulse Rate:  [73-91] 73 (06/20 1300) Resp:  [9-33] 12 (06/20 1300) BP: (67-151)/(21-123) 105/21 mmHg (06/20 1300) SpO2:  [94 %-100 %] 99 % (06/20 1300) FiO2 (%):  [30 %-50 %] 30 % (06/20 0933) Weight:  [76.4 kg (168 lb 6.9 oz)-77.8 kg (171 lb 8.3 oz)] 76.4 kg (168 lb 6.9 oz) (06/20 0600)  Intake/Output from previous day: 06/19 0701 - 06/20 0700 In: 2609 [I.V.:1809; IV Piggyback:800] Out: 2290 [Chest Tube:290] Intake/Output this shift: Total I/O In: 475 [I.V.:425; IV Piggyback:50] Out: -  Nutritional status: NPO  Neurologic Exam: Mental Status:  Patient opens his eyes when his name is called. Follow commands. Attempts to mouth responses to questions.  Cranial Nerves:  II: Blinks to bilateral confrontation, pupils right 4 mm, left 4 mm,and reactive bilaterally  III,IV,VI: EOM's intact  V,VII: Symmetric grimace.  VIII: grossly intact  IX,X: gag reflex unable to be tested, XI: trapezius strength unable to test bilaterally  XII: tongue strength unable to test  Motor:  Patient moves all extremities against gravity Sensory:  Responds to light stimuli throughout.  Deep Tendon Reflexes:  2+ throughout.  Plantars:  Unable to test secondary to amputations   Lab Results: Basic Metabolic Panel:  Recent Labs Lab 09/07/12 0704 09/07/12 0719 09/08/12 1930 09/09/12 0630 09/10/12 0425  NA 135 135 139 131* 135  K 6.2* 5.1 5.7* 4.5 3.4*  CL  --   --  96 92* 93*  CO2  --   --  21 25 27   GLUCOSE 86 98 31* 142* 121*  BUN  --   --  98* 42* 21  CREATININE  --   --  14.25* 8.11* 5.64*  CALCIUM  --   --  9.2 9.3 9.3  PHOS  --   --  9.8*  --   4.5    Liver Function Tests:  Recent Labs Lab 09/08/12 1930 09/09/12 1344 09/10/12 0425  PROT  --  6.6  --   ALBUMIN 2.4*  --  2.1*   No results found for this basename: LIPASE, AMYLASE,  in the last 168 hours No results found for this basename: AMMONIA,  in the last 168 hours  CBC:  Recent Labs Lab 09/07/12 0704 09/07/12 0719 09/09/12 0630 09/10/12 0425  WBC  --   --  8.6 6.7  HGB 13.3 12.9* 9.5* 8.1*  HCT 39.0 38.0* 30.5* 26.2*  MCV  --   --  86.2 84.2  PLT  --   --  152 135*    Cardiac Enzymes:  Recent Labs Lab 09/08/12 1930 09/09/12 0010 09/09/12 0730 09/09/12 1344 09/10/12 0425  TROPONINI 3.16* 4.24* 3.22* 3.43* 4.26*    Lipid Panel: No results found for this basename: CHOL, TRIG, HDL, CHOLHDL, VLDL, LDLCALC,  in the last 168 hours  CBG:  Recent Labs Lab 09/10/12 0034 09/10/12 0112 09/10/12 0406 09/10/12 0732 09/10/12 1131  GLUCAP 55* 88 120* 117* 130*    Microbiology: Results for orders placed during the hospital encounter of 09/08/12  MRSA PCR SCREENING     Status: None  Collection Time    09/08/12  5:49 PM      Result Value Range Status   MRSA by PCR NEGATIVE  NEGATIVE Final   Comment:            The GeneXpert MRSA Assay (FDA     approved for NASAL specimens     only), is one component of a     comprehensive MRSA colonization     surveillance program. It is not     intended to diagnose MRSA     infection nor to guide or     monitor treatment for     MRSA infections.  CULTURE, BLOOD (ROUTINE X 2)     Status: None   Collection Time    09/08/12  7:20 PM      Result Value Range Status   Specimen Description BLOOD BLOOD RIGHT ARM   Final   Special Requests BOTTLES DRAWN AEROBIC ONLY 10CC   Final   Culture  Setup Time 09/09/2012 01:42   Final   Culture     Final   Value:        BLOOD CULTURE RECEIVED NO GROWTH TO DATE CULTURE WILL BE HELD FOR 5 DAYS BEFORE ISSUING A FINAL NEGATIVE REPORT   Report Status PENDING   Incomplete   CULTURE, BLOOD (ROUTINE X 2)     Status: None   Collection Time    09/08/12  7:30 PM      Result Value Range Status   Specimen Description BLOOD DRAWN BY DIALYSIS   Final   Special Requests BOTTLES DRAWN AEROBIC AND ANAEROBIC 10CC   Final   Culture  Setup Time 09/09/2012 01:42   Final   Culture     Final   Value:        BLOOD CULTURE RECEIVED NO GROWTH TO DATE CULTURE WILL BE HELD FOR 5 DAYS BEFORE ISSUING A FINAL NEGATIVE REPORT   Report Status PENDING   Incomplete  BODY FLUID CULTURE     Status: None   Collection Time    09/09/12  1:14 PM      Result Value Range Status   Specimen Description PLEURAL FLUID RIGHT   Final   Special Requests FLUID   Final   Gram Stain     Final   Value: RARE WBC PRESENT, PREDOMINANTLY PMN     NO ORGANISMS SEEN   Culture NO GROWTH 1 DAY   Final   Report Status PENDING   Incomplete  CULTURE, RESPIRATORY (NON-EXPECTORATED)     Status: None   Collection Time    09/09/12  1:23 PM      Result Value Range Status   Specimen Description TRACHEAL ASPIRATE   Final   Special Requests NONE   Final   Gram Stain     Final   Value: RARE WBC PRESENT, PREDOMINANTLY PMN     RARE SQUAMOUS EPITHELIAL CELLS PRESENT     NO ORGANISMS SEEN   Culture NO GROWTH 1 DAY   Final   Report Status PENDING   Incomplete    Coagulation Studies: No results found for this basename: LABPROT, INR,  in the last 72 hours  Imaging: Dg Chest Port 1 View  09/10/2012   *RADIOLOGY REPORT*  Clinical Data: Follow up infiltrates and pneumothorax  PORTABLE CHEST - 1 VIEW  Comparison: 09/09/2012  Findings: Support apparatus including the endotracheal tube, nasogastric tube and dual lumen, femoral placed central venous line are stable.  Right-sided pigtail catheter, superimposed over  the right atrium is also stable.  Small inferolateral pneumothorax is unchanged.  Hazy areas of airspace lung opacity and prominence of the bronchovascular markings is also unchanged allowing for differences in  patient positioning and radiographic technique.  More dense consolidation is noted at the right lung base.  IMPRESSION: Stable appearance from the previous day's study.  Persistent small right inferolateral pneumothorax.  Hazy airspace lung opacities described previously are also stable, likely due to airspace edema.  No new abnormalities.   Original Report Authenticated By: Amie Portland, M.D.   Dg Chest Port 1 View  09/09/2012   *RADIOLOGY REPORT*  Clinical Data: Right chest tube  PORTABLE CHEST - 1 VIEW  Comparison: 09/09/2012  Findings: Pigtail drainage catheter has been placed overlying the right lung base.  Decrease in right basilar pneumothorax.  There remains a small right basilar pneumothorax.  Endotracheal tube in good position.  NG tube enters the stomach. Dual lumen femoral catheter enters the right atrium, unchanged.  Progression of diffuse bilateral airspace disease most likely edema.  Right lower lobe consolidation is present  IMPRESSION: Right pigtail drainage catheter has been placed in good position. Decrease in right basilar pneumothorax.  Progression of bilateral edema.   Original Report Authenticated By: Janeece Riggers, M.D.   Dg Chest Port 1 View  09/09/2012   *RADIOLOGY REPORT*  Clinical Data: Evaluate for pneumothorax  PORTABLE CHEST - 1 VIEW  Comparison: 08/06/2012  Findings: Endotracheal tube tip is above the carina.  There is a nasogastric tube with tip in the stomach. Moderate size right basilar hydropneumothorax appears loculated.  Surrounding atelectatic lung is noted.  There is mild diffuse interstitial edema.  IMPRESSION:  Moderate sized loculated hydropneumothorax status post thoracentesis.   Original Report Authenticated By: Signa Kell, M.D.   Dg Chest Port 1 View  09/09/2012   *RADIOLOGY REPORT*  Clinical Data: Pulmonary edema, right pleural effusion, and shortness of breath.  PORTABLE CHEST - 1 VIEW 5:22 a.m.  Comparison: 6/19 ( 12:09 am), 6/18, and 09/07/2012  Findings:  There is increased bilateral interstitial pulmonary edema.  Right pleural effusion is unchanged.  Heart size is unchanged.  IMPRESSION: Increased bilateral pulmonary edema.  Stable large right pleural effusion.   Original Report Authenticated By: Francene Boyers, M.D.   Dg Chest Port 1 View  09/09/2012   *RADIOLOGY REPORT*  Clinical Data: Central venous line attempt.  Catheter did not pass.  PORTABLE CHEST - 1 VIEW  Comparison: Chest radiograph 09/08/2012 and 09/07/2012.  CT chest 08/06/2012.  Findings: Stable cardiomegaly.  Moderate sized right pleural effusion.  Right perihilar and right basilar lung opacities are unchanged.  Small opacity in the lingula of the left lung could reflect atelectasis or airspace disease.  Negative for pneumothorax.  No visible pleural effusion on the left.  Dialysis catheter via an inferior approach projects over the expected location of the inferior vena cava and terminates in the expected location of the right atrium.  IMPRESSION: No significant change in moderate right pleural effusion and right perihilar and right basilar opacities. No evidence of pneumothorax.   Original Report Authenticated By: Britta Mccreedy, M.D.    Medications:  I have reviewed the patient's current medications. Scheduled: . antiseptic oral rinse  15 mL Mouth Rinse q12n4p  . aspirin  81 mg Oral Daily  . atorvastatin  80 mg Oral q1800  . chlorhexidine  15 mL Mouth Rinse BID  . [START ON 09/11/2012] darbepoetin (ARANESP) injection - DIALYSIS  100 mcg Intravenous Q Sat-HD  . [  START ON 09/17/2012] darbepoetin (ARANESP) injection - DIALYSIS  100 mcg Intravenous Q Fri-HD  . insulin aspart  0-9 Units Subcutaneous Q4H  . pantoprazole (PROTONIX) IV  40 mg Intravenous Q24H  . piperacillin-tazobactam (ZOSYN)  IV  2.25 g Intravenous Q8H    Assessment/Plan: Patient much improved today.  No focality noted on examination.  No further neurologic intervention is recommended at this time.  If further questions  arise, please call or page at that time.  Thank you for allowing neurology to participate in the care of this patient.   LOS: 2 days   Thana Farr, MD [facility] [phone] 09/10/2012  1:14 PM

## 2012-09-10 NOTE — Progress Notes (Signed)
[facility] ROUNDING NOTE   ASSESSMENT/RECOMMENDATIONS  ESRD MWF [facility]. Fausto Skillern Got extra treatment 6/19 for pulm edema HD Saturday, then back to MWF schedule next week New right TDC fem + new AVG (6/17) Resp failure Extubated S/p extra dialysis, thoracentesis 6/19 Chest tube for PTX 6/19 Elevated cardiac enzymes Echo pending Cards contemplating cath on Monday possible (CAD by cath 07/2011) Anemia Hb down to 8.1 Check iron studies and starting Darbepoetin 100 qweek Fever RLL infiltrate/effusion + decub Cultures pending On Vanco and zosyn Hypoglycemia Getting D5 at 75 BS's look better Reduce to 50/hour Encephalopathy Much improved Not sure what primary culprit was (multiple - hypoxemia, hyperecarbia, ?infection) Secondary HPT Resume sensipar and binders when good po    Subjective:  Events of yesterday noted, including attempted line placement, intubation,  thoracentesis of 1 liter of pleural fluid, placement of right chest tube for PTX, extra dialysis for pulmonary edema with 2 liters off. Extubated this morning Awake and alert now Getting 2D echo    Objective Vital signs in last 24 hours: Filed Vitals:   09/10/12 0933 09/10/12 1000 09/10/12 1100 09/10/12 1130  BP: 117/35 122/29 93/24   Pulse: 83 85 75 77  Temp:    99.3 F (37.4 C)  TempSrc:    Oral  Resp: 23 10 12 15   Height:      Weight:      SpO2: 97% 97% 99% 100%   Weight change: 2.2 kg (4 lb 13.6 oz)  Intake/Output Summary (Last 24 hours) at 09/10/12 1154 Last data filed at 09/10/12 1100  Gross per 24 hour  Intake   2650 ml  Output   2290 ml  Net    360 ml   Physical Exam: BP 93/24  Pulse 77  Temp(Src) 99.3 F (37.4 C) (Oral)  Resp 15  Ht 5\' 6"  (1.676 m)  Wt 76.4 kg (168 lb 6.9 oz)  BMI 27.2 kg/m2  SpO2 100% Bearded BM Awake and alert on nasal cannula in NAD Right chest tube with blood fluid draining Lungs ant clear Regular rhythm  S1S2 No S3 2/6 murmur USB No  pericardial rub Left upper arm AVG (new 6/17) + bruit and thrill Abdomen non tender Right femoral TDC clean and dry No edema of LE's  Labs: Basic Metabolic Panel:  Recent Labs Lab 09/07/12 0704 09/07/12 0719 09/08/12 1930 09/09/12 0630 09/10/12 0425  NA 135 135 139 131* 135  K 6.2* 5.1 5.7* 4.5 3.4*  CL  --   --  96 92* 93*  CO2  --   --  21 25 27   GLUCOSE 86 98 31* 142* 121*  BUN  --   --  98* 42* 21  CREATININE  --   --  14.25* 8.11* 5.64*  CALCIUM  --   --  9.2 9.3 9.3  PHOS  --   --  9.8*  --  4.5    Recent Labs Lab 09/08/12 1930 09/09/12 1344 09/10/12 0425  PROT  --  6.6  --   ALBUMIN 2.4*  --  2.1*  CBC:  Recent Labs Lab 09/07/12 0704 09/07/12 0719 09/09/12 0630 09/10/12 0425  WBC  --   --  8.6 6.7  HGB 13.3 12.9* 9.5* 8.1*  HCT 39.0 38.0* 30.5* 26.2*  MCV  --   --  86.2 84.2  PLT  --   --  152 135*    Results for ANDREU, DRUDGE (MRN [id number]) as of 09/10/2012 11:58  Ref. Range 09/09/2012  05:05 09/09/2012 13:19 09/10/2012 03:38  pH, Arterial Latest Range: 7.350-7.450  7.323 (L) 7.333 (L) 7.537 (H)  pCO2 arterial Latest Range: 35.0-45.0 mmHg 55.9 (H) 54.2 (H) 34.1 (L)  pO2, Arterial Latest Range: 80.0-100.0 mmHg 66.0 (L) 308.0 (H) 144.0 (H)  Bicarbonate Latest Range: 20.0-24.0 mEq/L 29.0 (H) 28.7 (H) 28.6 (H)  TCO2 Latest Range: 0-100 mmol/L 31 30 30   Acid-Base Excess Latest Range: 0.0-2.0 mmol/L 2.0 2.0 6.0 (H)  O2 Saturation No range found 91.0 100.0 99.0  Patient temperature No range found 98.6 F 99.3 F 102.0 F    Recent Labs Lab 09/08/12 1930 09/09/12 0010 09/09/12 0730 09/09/12 1344 09/10/12 0425  TROPONINI 3.16* 4.24* 3.22* 3.43* 4.26*   CBG:  Recent Labs Lab 09/10/12 0034 09/10/12 0112 09/10/12 0406 09/10/12 0732 09/10/12 1131  GLUCAP 55* 88 120* 117* 130*    Studies/Results: Dg Chest Port 1 View  09/10/2012   *RADIOLOGY REPORT*  Clinical Data: Follow up infiltrates and pneumothorax  PORTABLE CHEST - 1 VIEW  Comparison:  09/09/2012  Findings: Support apparatus including the endotracheal tube, nasogastric tube and dual lumen, femoral placed central venous line are stable.  Right-sided pigtail catheter, superimposed over the right atrium is also stable.  Small inferolateral pneumothorax is unchanged.  Hazy areas of airspace lung opacity and prominence of the bronchovascular markings is also unchanged allowing for differences in patient positioning and radiographic technique.  More dense consolidation is noted at the right lung base.  IMPRESSION: Stable appearance from the previous day's study.  Persistent small right inferolateral pneumothorax.  Hazy airspace lung opacities described previously are also stable, likely due to airspace edema.  No new abnormalities.   Original Report Authenticated By: Amie Portland, M.D.   Dg Chest Port 1 View  09/09/2012   *RADIOLOGY REPORT*  Clinical Data: Right chest tube  PORTABLE CHEST - 1 VIEW  Comparison: 09/09/2012  Findings: Pigtail drainage catheter has been placed overlying the right lung base.  Decrease in right basilar pneumothorax.  There remains a small right basilar pneumothorax.  Endotracheal tube in good position.  NG tube enters the stomach. Dual lumen femoral catheter enters the right atrium, unchanged.  Progression of diffuse bilateral airspace disease most likely edema.  Right lower lobe consolidation is present  IMPRESSION: Right pigtail drainage catheter has been placed in good position. Decrease in right basilar pneumothorax.  Progression of bilateral edema.   Original Report Authenticated By: Janeece Riggers, M.D.   Dg Chest Port 1 View  09/09/2012   *RADIOLOGY REPORT*  Clinical Data: Evaluate for pneumothorax  PORTABLE CHEST - 1 VIEW  Comparison: 08/06/2012  Findings: Endotracheal tube tip is above the carina.  There is a nasogastric tube with tip in the stomach. Moderate size right basilar hydropneumothorax appears loculated.  Surrounding atelectatic lung is noted.  There is  mild diffuse interstitial edema.  IMPRESSION:  Moderate sized loculated hydropneumothorax status post thoracentesis.   Original Report Authenticated By: Signa Kell, M.D.   Dg Chest Port 1 View  09/09/2012   *RADIOLOGY REPORT*  Clinical Data: Pulmonary edema, right pleural effusion, and shortness of breath.  PORTABLE CHEST - 1 VIEW 5:22 a.m.  Comparison: 6/19 ( 12:09 am), 6/18, and 09/07/2012  Findings: There is increased bilateral interstitial pulmonary edema.  Right pleural effusion is unchanged.  Heart size is unchanged.  IMPRESSION: Increased bilateral pulmonary edema.  Stable large right pleural effusion.   Original Report Authenticated By: Francene Boyers, M.D.   Dg Chest Port 1 View  09/09/2012   *  RADIOLOGY REPORT*  Clinical Data: Central venous line attempt.  Catheter did not pass.  PORTABLE CHEST - 1 VIEW  Comparison: Chest radiograph 09/08/2012 and 09/07/2012.  CT chest 08/06/2012.  Findings: Stable cardiomegaly.  Moderate sized right pleural effusion.  Right perihilar and right basilar lung opacities are unchanged.  Small opacity in the lingula of the left lung could reflect atelectasis or airspace disease.  Negative for pneumothorax.  No visible pleural effusion on the left.  Dialysis catheter via an inferior approach projects over the expected location of the inferior vena cava and terminates in the expected location of the right atrium.  IMPRESSION: No significant change in moderate right pleural effusion and right perihilar and right basilar opacities. No evidence of pneumothorax.   Original Report Authenticated By: Britta Mccreedy, M.D.   Medications: . sodium chloride 10 mL/hr at 09/08/12 1930  . dextrose 75 mL/hr at 09/09/12 1900  . dextrose 5 % and 0.9% NaCl 30 mL/hr at 09/08/12 2202   . antiseptic oral rinse  15 mL Mouth Rinse q12n4p  . aspirin  81 mg Oral Daily  . atorvastatin  80 mg Oral q1800  . chlorhexidine  15 mL Mouth Rinse BID  . insulin aspart  0-9 Units Subcutaneous Q4H   . pantoprazole (PROTONIX) IV  40 mg Intravenous Q24H  . piperacillin-tazobactam (ZOSYN)  IV  2.25 g Intravenous Q8H    I  have reviewed scheduled and prn medications.  Camille Bal, MD [facility] [phone] pager 09/10/2012, 11:54 AM

## 2012-09-10 NOTE — Progress Notes (Signed)
Echocardiogram  2D Echocardiogram has been performed.  [name] 09/10/2012, 11:48 AM

## 2012-09-10 NOTE — Procedures (Signed)
Extubation Procedure Note  Patient Details:   Name: HAROUT SCHEURICH DOB: 1951/03/07 MRN: [id number]   Airway Documentation:     Evaluation  O2 sats: stable throughout and currently acceptable Complications: No apparent complications Patient did tolerate procedure well. Bilateral Breath Sounds: Clear Suctioning: Airway Yes Pt awake  and alert. Extubated per MD order. Placed on 3L Erma, sat 97%. Positive cuff leak. Pt able to vocalize.  t  Arloa Koh 09/10/2012, 11:12 AM

## 2012-09-11 ENCOUNTER — Inpatient Hospital Stay (HOSPITAL_COMMUNITY): Payer: Medicare Other

## 2012-09-11 ENCOUNTER — Encounter (HOSPITAL_COMMUNITY): Payer: Self-pay

## 2012-09-11 LAB — IRON AND TIBC
Iron: 24 ug/dL — ABNORMAL LOW (ref 42–135)
TIBC: 98 ug/dL — ABNORMAL LOW (ref 215–435)

## 2012-09-11 LAB — RENAL FUNCTION PANEL
Albumin: 1.9 g/dL — ABNORMAL LOW (ref 3.5–5.2)
BUN: 28 mg/dL — ABNORMAL HIGH (ref 6–23)
Chloride: 89 mEq/L — ABNORMAL LOW (ref 96–112)
GFR calc Af Amer: 8 mL/min — ABNORMAL LOW (ref >90)
Glucose, Bld: 113 mg/dL — ABNORMAL HIGH (ref 70–99)
Potassium: 3.2 mEq/L — ABNORMAL LOW (ref 3.5–5.1)
Sodium: 131 mEq/L — ABNORMAL LOW (ref 135–145)

## 2012-09-11 LAB — CBC
HCT: 25.2 % — ABNORMAL LOW (ref 39.0–52.0)
Hemoglobin: 7.9 g/dL — ABNORMAL LOW (ref 13.0–17.0)
WBC: 5.8 10*3/uL (ref 4.0–10.5)

## 2012-09-11 LAB — CULTURE, RESPIRATORY W GRAM STAIN

## 2012-09-11 LAB — GLUCOSE, CAPILLARY
Glucose-Capillary: 112 mg/dL — ABNORMAL HIGH (ref 70–99)
Glucose-Capillary: 89 mg/dL (ref 70–99)
Glucose-Capillary: 133 mg/dL — ABNORMAL HIGH (ref 70–99)
Glucose-Capillary: 102 mg/dL — ABNORMAL HIGH (ref 70–99)

## 2012-09-11 MED ORDER — VANCOMYCIN HCL IN DEXTROSE 750-5 MG/150ML-% IV SOLN
750.0000 mg | Freq: Once | INTRAVENOUS | Status: AC
  Administered 2012-09-11: 750 mg via INTRAVENOUS
  Filled 2012-09-11: qty 150

## 2012-09-11 MED ORDER — OXYCODONE-ACETAMINOPHEN 5-325 MG PO TABS
1.0000 | ORAL_TABLET | Freq: Four times a day (QID) | ORAL | Status: DC | PRN
  Administered 2012-09-11 – 2012-09-15 (×12): 1 via ORAL
  Filled 2012-09-11 (×10): qty 1

## 2012-09-11 MED ORDER — SODIUM CHLORIDE 0.9 % IV SOLN
125.0000 mg | INTRAVENOUS | Status: DC
  Administered 2012-09-13: 125 mg via INTRAVENOUS
  Filled 2012-09-11 (×2): qty 10

## 2012-09-11 MED ORDER — POTASSIUM CHLORIDE CRYS ER 20 MEQ PO TBCR
30.0000 meq | EXTENDED_RELEASE_TABLET | Freq: Once | ORAL | Status: DC
  Filled 2012-09-11: qty 1

## 2012-09-11 MED ORDER — CINACALCET HCL 30 MG PO TABS
30.0000 mg | ORAL_TABLET | Freq: Every day | ORAL | Status: DC
  Administered 2012-09-11 – 2012-09-14 (×4): 30 mg via ORAL
  Filled 2012-09-11 (×7): qty 1

## 2012-09-11 MED ORDER — LANTHANUM CARBONATE 500 MG PO CHEW
2000.0000 mg | CHEWABLE_TABLET | Freq: Three times a day (TID) | ORAL | Status: DC
  Administered 2012-09-11 – 2012-09-14 (×8): 2000 mg via ORAL
  Filled 2012-09-11 (×16): qty 4

## 2012-09-11 MED ORDER — DOXERCALCIFEROL 4 MCG/2ML IV SOLN
3.5000 ug | INTRAVENOUS | Status: DC
  Administered 2012-09-13: 3.5 ug via INTRAVENOUS
  Filled 2012-09-11 (×2): qty 2

## 2012-09-11 MED ORDER — CALCIUM ACETATE 667 MG PO CAPS
2001.0000 mg | ORAL_CAPSULE | Freq: Three times a day (TID) | ORAL | Status: DC
  Administered 2012-09-11 – 2012-09-14 (×8): 2001 mg via ORAL
  Filled 2012-09-11 (×16): qty 3

## 2012-09-11 MED ORDER — DARBEPOETIN ALFA-POLYSORBATE 100 MCG/0.5ML IJ SOLN
INTRAMUSCULAR | Status: AC
  Administered 2012-09-11: 100 ug via INTRAVENOUS
  Filled 2012-09-11: qty 0.5

## 2012-09-11 NOTE — Progress Notes (Signed)
PULMONARY  / CRITICAL CARE MEDICINE  Name: [name] MRN: [id number] DOB: 12-02-1950    ADMISSION DATE:  09/08/2012  REFERRING MD :  ER  CHIEF COMPLAINT: Change in mental status  BRIEF PATIENT DESCRIPTION:  62 yo male brought to ED by family with change in mental status.  Noted to have fever, and hyperkalemia.  Transferred from [facility] to [facility] for emergent HD. ABG was 7.33/49/68 on 3 liters.  UDS was negative.  Lactic acid was 1.9.  Troponin was 3.92.  ON 6/17 he had an access procedure with ligation of nonmaturing LUA BVT, placement new LUA AV graft and placement of new R femoral tunneled HD cath.     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  Awake, answers questions appropriately.  VITAL SIGNS: Temp:  [97.2 F (36.2 C)-99.3 F (37.4 C)] 97.2 F (36.2 C) (06/21 0729) Pulse Rate:  [64-85] 73 (06/21 0900) Resp:  [10-22] 12 (06/21 0729) BP: (90-157)/(21-75) 138/35 mmHg (06/21 0900) SpO2:  [93 %-100 %] 97 % (06/21 0729) Weight:  [74 kg (163 lb 2.3 oz)-76.9 kg (169 lb 8.5 oz)] 76.9 kg (169 lb 8.5 oz) (06/21 0729)   INTAKE / OUTPUT: Intake/Output     06/20 0701 - 06/21 0700 06/21 0701 - 06/22 0700   P.O. 170    I.V. (mL/kg) 1325 (17.9)    IV Piggyback 150    Total Intake(mL/kg) 1645 (22.2)    Other     Chest Tube 80    Total Output 80     Net +1565            PHYSICAL EXAMINATION: General:wd male in nad Neuro: Wide awake, follows commands, Moves all 4. HEENT: nose without purulence or d/c, no TMG or  LN Cardiovascular: regular, no murmur Lungs: mildly decreased bs, no wheezing, scattered crackles.  Abdomen:  Soft, + bowel sounds, non tender Musculoskeletal:  Lt and Rt TMA, no edema, AV graft Lt arm, HD cath Rt femoral region  LABS:  Recent Labs Lab 09/09/12 0630 09/10/12 0425 09/11/12 0515  HGB 9.5* 8.1* 7.9*  HCT 30.5* 26.2* 25.2*  WBC 8.6 6.7 5.8  PLT 152 135* 167    Recent Labs Lab 09/07/12 0719 09/08/12 1930 09/09/12 0630 09/10/12 0425 09/11/12 0515  NA 135 139 131* 135 131*  K 5.1 5.7* 4.5 3.4* 3.2*  CL  --  96 92* 93* 89*  CO2  --  21 25 27 26   GLUCOSE 98 31* 142* 121* 113*  BUN  --  98* 42* 21 28*  CREATININE  --  14.25* 8.11* 5.64* 7.65*  CALCIUM  --  9.2 9.3 9.3 8.8  PHOS  --  9.8*  --  4.5 6.4*    Recent Labs Lab 09/08/12 1930 09/09/12 1344 09/10/12 0425 09/11/12 0515  PROT  --  6.6  --   --   ALBUMIN 2.4*  --  2.1* 1.9*    Recent Labs Lab 09/09/12 0114 09/09/12 0505 09/09/12 1319 09/10/12 0338  PHART 7.374  7.323* 7.333* 7.537*  PCO2ART 52.8* 55.9* 54.2* 34.1*  PO2ART 370.0* 66.0* 308.0* 144.0*  HCO3 30.6* 29.0* 28.7* 28.6*  TCO2 32 31 30 30   O2SAT 100.0 91.0 100.0 99.0      Recent Labs Lab 09/10/12 1131 09/10/12 1610 09/10/12 2049 09/10/12 2344 09/11/12 0337  GLUCAP 130* 95 110* 97 112*    CXR: 6/21:  No change bilat infiltrates and tiny right basilar ptx.  ASSESSMENT / PLAN:  PULMONARY A: Hypoxemia with progressive chronic Rt effusion and B infiltrates.  Likely also has some degree of hypoventilation. The pt has bilat pulmonary infiltrates of unknown origin.  Cultures unhelpful so far.  He is continuing on broad spectrum abx for possible infection. Thoracentesis from 08/15/11 >> protein 3.2, LDH 155, WBC 214, reactive mesothelial cells.  Culture negative on fluid so far.  He is tolerating extubation well on low flow oxygen with no increased wob. P:   -continue abx and pulmonary toilet measures -leave chest tube for now,  although ptx is minimal at right base.  ?needs ct chest to evaluate space prior to tube removal?  CARDIOVASCULAR A: Elevated cardiac enzymes >> uncertain significance in setting of renal failure. Hx of PVD, HTN, Diastolic heart failure. P:  -fluid management using HD -probable CAD, w/u to unfold once acute events resolve, appreciate cardiology's help   RENAL A:  ESRD. Hyperkalemia, resolved P:   -HD per renal -electrolyte replacement as indicated   INFECTIOUS A:  Fever, progressive Rt infiltrate/effusion, healed sacral decubitus ulcer. P:   -no change abx, followup final culture results.  ENDOCRINE A: Hypoglycemia.  Suspect related to poor insulin clearance, renal status P:   -d/c D10 now that sugars are improved.

## 2012-09-11 NOTE — Progress Notes (Addendum)
Patient ID: [name], male   DOB: [birth year], 62 y.o.   MRN: [id number]   SUBJECTIVE: Stable in dialysis. I can not find echo that was supposedly done.   Filed Vitals:   09/11/12 0830 09/11/12 0900 09/11/12 0930 09/11/12 1000  BP: 111/39 138/35 122/56 111/35  Pulse: 76 73 77 79  Temp:      TempSrc:      Resp:      Height:      Weight:      SpO2:        Intake/Output Summary (Last 24 hours) at 09/11/12 1110 Last data filed at 09/11/12 0700  Gross per 24 hour  Intake   1295 ml  Output     80 ml  Net   1215 ml    LABS: Basic Metabolic Panel:  Recent Labs  78/29/56 0425 09/11/12 0515  NA 135 131*  K 3.4* 3.2*  CL 93* 89*  CO2 27 26  GLUCOSE 121* 113*  BUN 21 28*  CREATININE 5.64* 7.65*  CALCIUM 9.3 8.8  PHOS 4.5 6.4*   Liver Function Tests:  Recent Labs  09/09/12 1344 09/10/12 0425 09/11/12 0515  PROT 6.6  --   --   ALBUMIN  --  2.1* 1.9*   No results found for this basename: LIPASE, AMYLASE,  in the last 72 hours CBC:  Recent Labs  09/10/12 0425 09/11/12 0515  WBC 6.7 5.8  HGB 8.1* 7.9*  HCT 26.2* 25.2*  MCV 84.2 85.4  PLT 135* 167   Cardiac Enzymes:  Recent Labs  09/09/12 0730 09/09/12 1344 09/10/12 0425  TROPONINI 3.22* 3.43* 4.26*   BNP: No components found with this basename: POCBNP,  D-Dimer: No results found for this basename: DDIMER,  in the last 72 hours Hemoglobin A1C: No results found for this basename: HGBA1C,  in the last 72 hours Fasting Lipid Panel: No results found for this basename: CHOL, HDL, LDLCALC, TRIG, CHOLHDL, LDLDIRECT,  in the last 72 hours Thyroid Function Tests: No results found for this basename: TSH, T4TOTAL, FREET3, T3FREE, THYROIDAB,  in the last 72 hours  RADIOLOGY: Dg Chest 2 View  09/07/2012   *RADIOLOGY REPORT*  Clinical Data: Preop for graft insertion.  CHEST - 2 VIEW  Comparison: Chest x-ray 08/06/2012.  Findings: There is a chronic loculated right pleural fluid collection.  The heart is  mildly enlarged but stable.  Moderate tortuosity and calcification of the thoracic aorta.  The left lung demonstrates a small effusion.  Mild chronic vascular congestion and streaky areas of atelectasis.  Probable persistent rounded atelectasis in the left lower lobe.  IMPRESSION:  1.  Chronic right pleural effusion. 2.  Vascular congestion and persistent areas of atelectasis.   Original Report Authenticated By: Rudie Meyer, M.D.   Dg Chest Port 1 View  09/11/2012   *RADIOLOGY REPORT*  Clinical Data: Pneumothorax, right chest tube  PORTABLE CHEST - 1 VIEW  Comparison: Portable exam 0612 hours compared to 09/10/2012  Findings: Pigtail right thoracostomy tube again seen with small residual right basilar pneumothorax little change from previous study. Enlargement of cardiac silhouette. Femoral line tip projects over right atrium. Diffuse bilateral pulmonary infiltrates question edema versus infection. Bones unremarkable.  IMPRESSION: Persistent small basilar right pneumothorax despite thoracostomy tube. Bilateral pulmonary infiltrates, little changed.   Original Report Authenticated By: Ulyses Southward, M.D.   Dg Chest Port 1 View  09/10/2012   *RADIOLOGY REPORT*  Clinical Data: Follow up infiltrates and pneumothorax  PORTABLE CHEST - 1  VIEW  Comparison: 09/09/2012  Findings: Support apparatus including the endotracheal tube, nasogastric tube and dual lumen, femoral placed central venous line are stable.  Right-sided pigtail catheter, superimposed over the right atrium is also stable.  Small inferolateral pneumothorax is unchanged.  Hazy areas of airspace lung opacity and prominence of the bronchovascular markings is also unchanged allowing for differences in patient positioning and radiographic technique.  More dense consolidation is noted at the right lung base.  IMPRESSION: Stable appearance from the previous day's study.  Persistent small right inferolateral pneumothorax.  Hazy airspace lung opacities described  previously are also stable, likely due to airspace edema.  No new abnormalities.   Original Report Authenticated By: Amie Portland, M.D.   Dg Chest Port 1 View  09/09/2012   *RADIOLOGY REPORT*  Clinical Data: Right chest tube  PORTABLE CHEST - 1 VIEW  Comparison: 09/09/2012  Findings: Pigtail drainage catheter has been placed overlying the right lung base.  Decrease in right basilar pneumothorax.  There remains a small right basilar pneumothorax.  Endotracheal tube in good position.  NG tube enters the stomach. Dual lumen femoral catheter enters the right atrium, unchanged.  Progression of diffuse bilateral airspace disease most likely edema.  Right lower lobe consolidation is present  IMPRESSION: Right pigtail drainage catheter has been placed in good position. Decrease in right basilar pneumothorax.  Progression of bilateral edema.   Original Report Authenticated By: Janeece Riggers, M.D.   Dg Chest Port 1 View  09/09/2012   *RADIOLOGY REPORT*  Clinical Data: Evaluate for pneumothorax  PORTABLE CHEST - 1 VIEW  Comparison: 08/06/2012  Findings: Endotracheal tube tip is above the carina.  There is a nasogastric tube with tip in the stomach. Moderate size right basilar hydropneumothorax appears loculated.  Surrounding atelectatic lung is noted.  There is mild diffuse interstitial edema.  IMPRESSION:  Moderate sized loculated hydropneumothorax status post thoracentesis.   Original Report Authenticated By: Signa Kell, M.D.   Dg Chest Port 1 View  09/09/2012   *RADIOLOGY REPORT*  Clinical Data: Pulmonary edema, right pleural effusion, and shortness of breath.  PORTABLE CHEST - 1 VIEW 5:22 a.m.  Comparison: 6/19 ( 12:09 am), 6/18, and 09/07/2012  Findings: There is increased bilateral interstitial pulmonary edema.  Right pleural effusion is unchanged.  Heart size is unchanged.  IMPRESSION: Increased bilateral pulmonary edema.  Stable large right pleural effusion.   Original Report Authenticated By: Francene Boyers,  M.D.   Dg Chest Port 1 View  09/09/2012   *RADIOLOGY REPORT*  Clinical Data: Central venous line attempt.  Catheter did not pass.  PORTABLE CHEST - 1 VIEW  Comparison: Chest radiograph 09/08/2012 and 09/07/2012.  CT chest 08/06/2012.  Findings: Stable cardiomegaly.  Moderate sized right pleural effusion.  Right perihilar and right basilar lung opacities are unchanged.  Small opacity in the lingula of the left lung could reflect atelectasis or airspace disease.  Negative for pneumothorax.  No visible pleural effusion on the left.  Dialysis catheter via an inferior approach projects over the expected location of the inferior vena cava and terminates in the expected location of the right atrium.  IMPRESSION: No significant change in moderate right pleural effusion and right perihilar and right basilar opacities. No evidence of pneumothorax.   Original Report Authenticated By: Britta Mccreedy, M.D.   Dg Chest Port 1 View  09/07/2012   *RADIOLOGY REPORT*  Clinical Data: Diatek catheter insertion.  PORTABLE CHEST - 1 VIEW  Comparison: 09/07/2012.  Findings: A femoral diatek catheter is coursing  up the IVC.  The proximal port is in the right atrium and distal port is in the distal SVC.  The cardiac silhouette, mediastinal and hilar contours are stable.  Persistent right pleural effusion and overlying atelectasis.  IMPRESSION:  1.  Dialysis catheter tips in good position without complicating features. 2.  Stable large pleural effusion on the right with overlying atelectasis.   Original Report Authenticated By: Rudie Meyer, M.D.   Dg Fluoro Guide Cv Line-no Report  09/07/2012   CLINICAL DATA: diteck insertion   FLOURO GUIDE CV LINE  Fluoroscopy was utilized by the requesting physician.  No radiographic  interpretation.     PHYSICAL EXAM the patient is comfortable while his dialysis being done. There is no jugulovenous distention. Cardiac exam reveals S1 and S2. There is no significant edema.   TELEMETRY:   I  reviewed 09/11/2012.  NSR  ASSESSMENT AND PLAN:    Acute encephalopathy    End stage renal disease   Pleural effusion   Pneumonia   Hyperkalemia    Elevated troponin     I will look for echo. Cardiac workup to be completed later after other issues stable.    Acute respiratory failure with hypercapnia   Willa Rough 09/11/2012 11:10 AM

## 2012-09-11 NOTE — Progress Notes (Signed)
ANTIBIOTIC CONSULT NOTE - Follow-Up  Pharmacy Consult for Vancomycin and Zosyn Indication: rule out pneumonia  Allergies  Allergen Reactions  . Ambien (Zolpidem Tartrate) Other (See Comments)    Hallucinations    Patient Measurements: Height: 5\' 6"  (167.6 cm) Weight: 169 lb 8.5 oz (76.9 kg) IBW/kg (Calculated) : 63.8 Adjusted Body Weight: n/a  Labs:  Recent Labs  09/09/12 0630 09/10/12 0425 09/11/12 0515  WBC 8.6 6.7 5.8  HGB 9.5* 8.1* 7.9*  PLT 152 135* 167  CREATININE 8.11* 5.64* 7.65*   Estimated Creatinine Clearance: 9.6 ml/min (by C-G formula based on Cr of 7.65). No results found for this basename: VANCOTROUGH, Leodis Binet, VANCORANDOM, GENTTROUGH, GENTPEAK, GENTRANDOM, TOBRATROUGH, TOBRAPEAK, TOBRARND, AMIKACINPEAK, AMIKACINTROU, AMIKACIN,  in the last 72 hours   Microbiology: Recent Results (from the past 720 hour(s))  SURGICAL PCR SCREEN     Status: None   Collection Time    09/07/12  6:58 AM      Result Value Range Status   MRSA, PCR NEGATIVE  NEGATIVE Final   Staphylococcus aureus NEGATIVE  NEGATIVE Final   Comment:            The Xpert SA Assay (FDA     approved for NASAL specimens     in patients over 13 years of age),     is one component of     a comprehensive surveillance     program.  Test performance has     been validated by [employer] for patients greater     than or equal to 81 year old.     It is not intended     to diagnose infection nor to     guide or monitor treatment.  MRSA PCR SCREENING     Status: None   Collection Time    09/08/12  5:49 PM      Result Value Range Status   MRSA by PCR NEGATIVE  NEGATIVE Final   Comment:            The GeneXpert MRSA Assay (FDA     approved for NASAL specimens     only), is one component of a     comprehensive MRSA colonization     surveillance program. It is not     intended to diagnose MRSA     infection nor to guide or     monitor treatment for     MRSA infections.  CULTURE, BLOOD  (ROUTINE X 2)     Status: None   Collection Time    09/08/12  7:20 PM      Result Value Range Status   Specimen Description BLOOD BLOOD RIGHT ARM   Final   Special Requests BOTTLES DRAWN AEROBIC ONLY 10CC   Final   Culture  Setup Time 09/09/2012 01:42   Final   Culture     Final   Value:        BLOOD CULTURE RECEIVED NO GROWTH TO DATE CULTURE WILL BE HELD FOR 5 DAYS BEFORE ISSUING A FINAL NEGATIVE REPORT   Report Status PENDING   Incomplete  CULTURE, BLOOD (ROUTINE X 2)     Status: None   Collection Time    09/08/12  7:30 PM      Result Value Range Status   Specimen Description BLOOD DRAWN BY DIALYSIS   Final   Special Requests BOTTLES DRAWN AEROBIC AND ANAEROBIC 10CC   Final   Culture  Setup Time 09/09/2012 01:42  Final   Culture     Final   Value:        BLOOD CULTURE RECEIVED NO GROWTH TO DATE CULTURE WILL BE HELD FOR 5 DAYS BEFORE ISSUING A FINAL NEGATIVE REPORT   Report Status PENDING   Incomplete  BODY FLUID CULTURE     Status: None   Collection Time    09/09/12  1:14 PM      Result Value Range Status   Specimen Description PLEURAL FLUID RIGHT   Final   Special Requests FLUID   Final   Gram Stain     Final   Value: RARE WBC PRESENT, PREDOMINANTLY PMN     NO ORGANISMS SEEN   Culture NO GROWTH 2 DAYS   Final   Report Status PENDING   Incomplete  CULTURE, RESPIRATORY (NON-EXPECTORATED)     Status: None   Collection Time    09/09/12  1:23 PM      Result Value Range Status   Specimen Description TRACHEAL ASPIRATE   Final   Special Requests NONE   Final   Gram Stain     Final   Value: RARE WBC PRESENT, PREDOMINANTLY PMN     RARE SQUAMOUS EPITHELIAL CELLS PRESENT     NO ORGANISMS SEEN   Culture Non-Pathogenic Oropharyngeal-type Flora Isolated.   Final   Report Status PENDING   Incomplete    Medical History: Past Medical History  Diagnosis Date  . Hyperlipidemia   . ESRD (end stage renal disease)     a. MWF dialysis in [place] (followed by Dr. Kristian Covey)  .  Arthritis   . Claudication   . Stroke 2005  . Anemia   . Chronic diastolic CHF (congestive heart failure) 07/2010    a. 07/2008 Echo EF 50-55%, mild-mod LVH, Gr 1 DD.  Marland Kitchen Peripheral vascular occlusive disease     a. 07/2011 Periph Angio: No signif Ao-illiac dzs, LCF stenosis, 100% LSFA w recon above knee pop and 3 vessel runoff, 100% RSFA w/ recon above knee --> pending L Fem to below Knee pop bypass.  Marland Kitchen History of tobacco abuse   . Hypertension     takes Amlodipine,Metoprolol,and Prinivil daily  . Dyspnea on exertion     with exertion  . GERD (gastroesophageal reflux disease)   . Cataract     bilateral  . Dialysis patient   . PUD (peptic ulcer disease) mid 1990's    with GI bleed.  endoscoped in mid 1990's at [facility]    Medications:  Scheduled:  . antiseptic oral rinse  15 mL Mouth Rinse q12n4p  . aspirin  81 mg Oral Daily  . atorvastatin  80 mg Oral q1800  . calcium acetate  2,001 mg Oral TID WC  . cinacalcet  30 mg Oral Q breakfast  . [START ON 09/17/2012] darbepoetin (ARANESP) injection - DIALYSIS  100 mcg Intravenous Q Fri-HD  . [START ON 09/13/2012] doxercalciferol  3.5 mcg Intravenous Q M,W,F-HD  . [START ON 09/13/2012] ferric gluconate (FERRLECIT/NULECIT) IV  125 mg Intravenous Q Mon-HD  . insulin aspart  0-9 Units Subcutaneous Q4H  . lanthanum  2,000 mg Oral TID WC  . pantoprazole (PROTONIX) IV  40 mg Intravenous Q24H  . piperacillin-tazobactam (ZOSYN)  IV  2.25 g Intravenous Q8H  . potassium chloride  30 mEq Oral Once  . [START ON 09/13/2012] vancomycin  750 mg Intravenous Q M,W,F-HD  . vancomycin  750 mg Intravenous Once   Assessment: 62 yo [name] on empiric vancomycin and  zosyn for PNA.  Cultures negative thus far.  Patient on chronic HD, with extra sessions this week.  Had HD session this AM 3.5 hrs with BRF  ~400.  Zosyn dosing appropriate for ESRD patient.  Goal of Therapy:  Pre HD vancomycin level 15-25  Plan:  1. Redose vancomycin 750 mg IV x 1 now, then  resume 750 mg IV q MWF schedule. 2. Continue Zosyn at current dosage. 3. Will monitor for any additional HD sessions and how well sessions are tolerate. 4. F/U cultures and length of antibiotic therapy.  Tad Moore, BCPS  Clinical Pharmacist Pager [phone]  09/11/2012 1:40 PM

## 2012-09-11 NOTE — Progress Notes (Signed)
eLink Physician-Brief Progress Note Patient Name: [name] DOB: 03-26-1950 MRN: [id number]  Date of Service  09/11/2012   HPI/Events of Note   Radicular pain in both arms. [place]ing for home percocet  eICU Interventions  Home percocet restarted   Intervention Category Major Interventions: Acid-Base disturbance - evaluation and management  Donetta Isaza 09/11/2012, 9:03 PM

## 2012-09-11 NOTE — Progress Notes (Addendum)
[facility] ROUNDING NOTE   ASSESSMENT/RECOMMENDATIONS  ESRD MWF [facility] Dr. Fausto Skillern Got extra treatment 6/19 for pulm edema HD today, then back to MWF schedule next week New right TDC fem + new AVG (6/17) Resp failure Extubated S/p extra dialysis, thoracentesis 6/19 Chest tube for PTX 6/19 Sats good on nasal cannula CXR with small residual PTX Elevated cardiac enzymes Echo results as noted Cards contemplating cath on Monday possible (CAD by cath 07/2011) Anemia Hb down to 7.9 Checking iron studies and starting Darbepoetin 100 qweek (6/21) Add weekly iron as per outpt schedule (ferrlecit for venofer) Fever Afebrile now RLL infiltrate/effusion + decub Cultures pending (all neg as of 6/21) On Vanco and zosyn Hypoglycemia D10 at 50/hour Eating OK No more low CBG's Reduce to [facility] Encephalopathy Much improved Not sure what primary culprit was (multiple - hypoxemia, hyperecarbia, ?infection) Secondary HPT Resume sensipar and binders (6/21)    Subjective:  Awake, alert, seen in hemo On nasal O2 Chest tube in  Tunneled HD cath right groin at 400 No paini or SOB  Objective Vital signs in last 24 hours: Filed Vitals:   09/11/12 0500 09/11/12 0600 09/11/12 0700 09/11/12 0729  BP: 100/35 109/34 156/36 157/61  Pulse: 71 64 67 70  Temp:    97.2 F (36.2 C)  TempSrc:    Oral  Resp: 12 11 12 12   Height:      Weight: 74 kg (163 lb 2.3 oz)     SpO2: 100% 100% 99% 97%   Weight change: -3.8 kg (-8 lb 6 oz)  Intake/Output Summary (Last 24 hours) at 09/11/12 0741 Last data filed at 09/11/12 0700  Gross per 24 hour  Intake   1645 ml  Output     80 ml  Net   1565 ml   Physical Exam: BP 157/61  Pulse 70  Temp(Src) 97.2 F (36.2 C) (Oral)  Resp 12  Ht 5\' 6"  (1.676 m)  Wt 74 kg (163 lb 2.3 oz)  BMI 26.34 kg/m2  SpO2 97% Bearded BM Awake and alert on nasal cannula in NAD Line in left neck Right chest tube with blood tinged fluid  Lungs ant mild exp  wheeze Regular rhythm  S1S2 No S3 2/6 murmur USB No pericardial rub Left upper arm AVG (new 6/17) + bruit and thrill Kerlix wrapped Abdomen non tender Right femoral TDC clean and dry No edema of LE's Left TMA Right absent great toe  Dry changes left heel, right great toe amp site  Labs: Basic Metabolic Panel:  Recent Labs Lab 09/07/12 0704 09/07/12 0719 09/08/12 1930 09/09/12 0630 09/10/12 0425 09/11/12 0515  NA 135 135 139 131* 135 131*  K 6.2* 5.1 5.7* 4.5 3.4* 3.2*  CL  --   --  96 92* 93* 89*  CO2  --   --  21 25 27 26   GLUCOSE 86 98 31* 142* 121* 113*  BUN  --   --  98* 42* 21 28*  CREATININE  --   --  14.25* 8.11* 5.64* 7.65*  CALCIUM  --   --  9.2 9.3 9.3 8.8  PHOS  --   --  9.8*  --  4.5 6.4*    Recent Labs Lab 09/08/12 1930 09/09/12 1344 09/10/12 0425 09/11/12 0515  PROT  --  6.6  --   --   ALBUMIN 2.4*  --  2.1* 1.9*  CBC:  Recent Labs Lab 09/07/12 0719 09/09/12 0630 09/10/12 0425 09/11/12 0515  WBC  --  8.6 6.7 5.8  HGB 12.9* 9.5* 8.1* 7.9*  HCT 38.0* 30.5* 26.2* 25.2*  MCV  --  86.2 84.2 85.4  PLT  --  152 135* 167     Recent Labs Lab 09/08/12 1930 09/09/12 0010 09/09/12 0730 09/09/12 1344 09/10/12 0425  TROPONINI 3.16* 4.24* 3.22* 3.43* 4.26*   CBG:  Recent Labs Lab 09/10/12 1131 09/10/12 1610 09/10/12 2049 09/10/12 2344 09/11/12 0337  GLUCAP 130* 95 110* 97 112*    Studies/Results: Dg Chest Port 1 View  09/11/2012   *RADIOLOGY REPORT*  Clinical Data: Pneumothorax, right chest tube  PORTABLE CHEST - 1 VIEW  Comparison: Portable exam 0612 hours compared to 09/10/2012  Findings: Pigtail right thoracostomy tube again seen with small residual right basilar pneumothorax little change from previous study. Enlargement of cardiac silhouette. Femoral line tip projects over right atrium. Diffuse bilateral pulmonary infiltrates question edema versus infection. Bones unremarkable.  IMPRESSION: Persistent small basilar right pneumothorax  despite thoracostomy tube. Bilateral pulmonary infiltrates, little changed.   Original Report Authenticated By: Ulyses Southward, M.D.   Dg Chest Port 1 View  09/10/2012   *RADIOLOGY REPORT*  Clinical Data: Follow up infiltrates and pneumothorax  PORTABLE CHEST - 1 VIEW  Comparison: 09/09/2012  Findings: Support apparatus including the endotracheal tube, nasogastric tube and dual lumen, femoral placed central venous line are stable.  Right-sided pigtail catheter, superimposed over the right atrium is also stable.  Small inferolateral pneumothorax is unchanged.  Hazy areas of airspace lung opacity and prominence of the bronchovascular markings is also unchanged allowing for differences in patient positioning and radiographic technique.  More dense consolidation is noted at the right lung base.  IMPRESSION: Stable appearance from the previous day's study.  Persistent small right inferolateral pneumothorax.  Hazy airspace lung opacities described previously are also stable, likely due to airspace edema.  No new abnormalities.   Original Report Authenticated By: Amie Portland, M.D.   Dg Chest Port 1 View  09/09/2012   *RADIOLOGY REPORT*  Clinical Data: Right chest tube  PORTABLE CHEST - 1 VIEW  Comparison: 09/09/2012  Findings: Pigtail drainage catheter has been placed overlying the right lung base.  Decrease in right basilar pneumothorax.  There remains a small right basilar pneumothorax.  Endotracheal tube in good position.  NG tube enters the stomach. Dual lumen femoral catheter enters the right atrium, unchanged.  Progression of diffuse bilateral airspace disease most likely edema.  Right lower lobe consolidation is present  IMPRESSION: Right pigtail drainage catheter has been placed in good position. Decrease in right basilar pneumothorax.  Progression of bilateral edema.   Original Report Authenticated By: Janeece Riggers, M.D.   Dg Chest Port 1 View  09/09/2012   *RADIOLOGY REPORT*  Clinical Data: Evaluate for  pneumothorax  PORTABLE CHEST - 1 VIEW  Comparison: 08/06/2012  Findings: Endotracheal tube tip is above the carina.  There is a nasogastric tube with tip in the stomach. Moderate size right basilar hydropneumothorax appears loculated.  Surrounding atelectatic lung is noted.  There is mild diffuse interstitial edema.  IMPRESSION:  Moderate sized loculated hydropneumothorax status post thoracentesis.   Original Report Authenticated By: Signa Kell, M.D.   2D echo LVEF is approximately 50% with inferior/inferoseptal hypokinesis. The cavity size was normal. Wall thickness was increased in a pattern of mild LVH. Systolic function was normal. The estimated ejection fraction was in the range of 50% to 55%  Cultures of blood, sputum and pleural fluid all negative to date  Medications: . sodium chloride 10  mL/hr at 09/08/12 1930  . dextrose 50 mL/hr at 09/10/12 1300   . antiseptic oral rinse  15 mL Mouth Rinse q12n4p  . aspirin  81 mg Oral Daily  . atorvastatin  80 mg Oral q1800  . darbepoetin (ARANESP) injection - DIALYSIS  100 mcg Intravenous Q Sat-HD  . [START ON 09/17/2012] darbepoetin (ARANESP) injection - DIALYSIS  100 mcg Intravenous Q Fri-HD  . insulin aspart  0-9 Units Subcutaneous Q4H  . pantoprazole (PROTONIX) IV  40 mg Intravenous Q24H  . piperacillin-tazobactam (ZOSYN)  IV  2.25 g Intravenous Q8H  . [START ON 09/13/2012] vancomycin  750 mg Intravenous Q M,W,F-HD  . vancomycin  750 mg Intravenous Once    I  have reviewed scheduled and prn medications.  Outpatient Dialysis orders: MWF [facility]  3 1/2 hours EDW 69.5 kg BFR 450 DFR600 2K2.5 Calcium 300 heparin bolus Hectorol 3.5 EPO 6000 Venofer 50/week   Camille Bal, MD [facility] [phone] pager 09/11/2012, 7:41 AM

## 2012-09-11 NOTE — Evaluation (Signed)
Physical Therapy Evaluation Patient Details Name: [name] MRN: [id number] DOB: 1950/04/22 Today's Date: 09/11/2012 Time: 4098-1191 PT Time Calculation (min): 32 min  PT Assessment / Plan / Recommendation Clinical Impression  Pt is a 62 yo male admitted with AMS and hyperkalemia. Pt was living alone independently now requires use of RW and assist for all transfers. Pt with limited assist at home and would require 24/7 assist at this time for safe d/c home. Currently pt to benefit from ST-SNF placement to achieve safe mod I function for safe transition home. Will con't to re-assess as pt progresses medically as patient's desire is to return home.    PT Assessment  Patient needs continued PT services    Follow Up Recommendations  SNF    Does the patient have the potential to tolerate intense rehabilitation      Barriers to Discharge Decreased caregiver support      Equipment Recommendations  Rolling walker with 5" wheels    Recommendations for Other Services     Frequency Min 3X/week    Precautions / Restrictions Precautions Precautions: Fall Restrictions Weight Bearing Restrictions: No   Pertinent Vitals/Pain L UE pain at 8/10       Mobility  Bed Mobility Bed Mobility: Supine to Sit Supine to Sit: 4: Min assist;HOB flat;With rails Details for Bed Mobility Assistance: limited L UE assist due to pain and numbness Transfers Transfers: Sit to Stand;Stand to Sit Sit to Stand: 4: Min guard;With upper extremity assist;From bed Stand to Sit: 4: Min assist;With upper extremity assist;To chair/3-in-1 Details for Transfer Assistance: v/c's for hand placement. pt with limited use of L UE to assist with transfer Ambulation/Gait Ambulation/Gait Assistance: 4: Min assist Ambulation Distance (Feet): 35 Feet Assistive device: Rolling walker Ambulation/Gait Assistance Details: c/o L UE pain and numbness, onset of fatigue. mild SOB, pt on 4 Lo2 via [place] Gait Pattern: Step-to  pattern;Decreased stride length;Antalgic Gait velocity: decr Stairs: No    Exercises     PT Diagnosis: Difficulty walking;Generalized weakness  PT Problem List: Decreased strength;Decreased activity tolerance;Decreased balance;Decreased mobility PT Treatment Interventions: DME instruction;Gait training;Stair training;Functional mobility training;Therapeutic activities;Therapeutic exercise   PT Goals Acute Rehab PT Goals PT Goal Formulation: With patient Time For Goal Achievement: 09/25/12 Potential to Achieve Goals: Good Pt will go Supine/Side to Sit: with supervision;with HOB 0 degrees PT Goal: Supine/Side to Sit - Progress: Goal set today Pt will go Sit to Stand: with supervision;with upper extremity assist (up to RW) PT Goal: Sit to Stand - Progress: Goal set today Pt will Ambulate: 51 - 150 feet;with supervision;with rolling walker PT Goal: Ambulate - Progress: Goal set today  Visit Information  Last PT Received On: 09/11/12 Assistance Needed: +2 (for lines)    Subjective Data  Subjective: Pt received supine in bed agreeable to PT   Prior Functioning  Home Living Lives With: Alone Available Help at Discharge: Personal care attendant (4-5hr/day, 7 days a week) Type of Home: House Home Access: Level entry Home Layout: One level Bathroom Shower/Tub: Health visitor: Standard Bathroom Accessibility: Yes How Accessible: Accessible via walker Home Adaptive Equipment: Hand-held shower hose;Shower chair with back;Walker - rolling Prior Function Level of Independence: Independent Able to Take Stairs?: Yes Driving: No Communication Communication: No difficulties Dominant Hand: Right    Cognition  Cognition Arousal/Alertness: Awake/alert Behavior During Therapy: WFL for tasks assessed/performed Overall Cognitive Status: Within Functional Limits for tasks assessed    Extremity/Trunk Assessment Right Upper Extremity Assessment RUE ROM/Strength/Tone: [facility]  for  tasks assessed Left Upper Extremity Assessment LUE ROM/Strength/Tone: Deficits;Due to pain LUE ROM/Strength/Tone Deficits: numbness, edema, shld flex to 80 deg LUE Sensation: Deficits LUE Sensation Deficits: numbness in hand Right Lower Extremity Assessment RLE ROM/Strength/Tone: [facility] for tasks assessed Left Lower Extremity Assessment LLE ROM/Strength/Tone: WFL for tasks assessed Trunk Assessment Trunk Assessment: Normal   Balance Balance Balance Assessed: Yes Static Sitting Balance Static Sitting - Balance Support: Bilateral upper extremity supported;Feet supported Static Sitting - Level of Assistance: 5: Stand by assistance Static Sitting - Comment/# of Minutes: 6 min  End of Session PT - End of Session Equipment Utilized During Treatment: Gait belt;Oxygen (4Lo2 via [place]) Activity Tolerance: Patient limited by fatigue;Patient limited by pain Patient left: in chair;with call bell/phone within reach;with family/visitor present Nurse Communication: Mobility status  GP     Marcene Brawn 09/11/2012, 4:40 PM  Lewis Shock, PT, DPT Pager #: [phone] Office #: [phone]

## 2012-09-11 NOTE — Procedures (Signed)
Pt seen in dialysis. Awake, alert.  Weight pending. 4K bath for K 3 2 (plus 30 of KDur once) TDC BFR 400 Hb down to 7.9 On 100 aranesp qweek (for EPO) Add weekly iron with HD as per outpt orders Resumed doxercalciferol and binders Plan HD again on Monday to return to usual schedule.

## 2012-09-12 DIAGNOSIS — J690 Pneumonitis due to inhalation of food and vomit: Secondary | ICD-10-CM

## 2012-09-12 LAB — RENAL FUNCTION PANEL
Albumin: 1.9 g/dL — ABNORMAL LOW (ref 3.5–5.2)
BUN: 16 mg/dL (ref 6–23)
Calcium: 8.4 mg/dL (ref 8.4–10.5)
Phosphorus: 4.5 mg/dL (ref 2.3–4.6)
Potassium: 3.3 mEq/L — ABNORMAL LOW (ref 3.5–5.1)
Sodium: 134 mEq/L — ABNORMAL LOW (ref 135–145)

## 2012-09-12 LAB — BODY FLUID CULTURE

## 2012-09-12 LAB — GLUCOSE, CAPILLARY
Glucose-Capillary: 105 mg/dL — ABNORMAL HIGH (ref 70–99)
Glucose-Capillary: 93 mg/dL (ref 70–99)

## 2012-09-12 LAB — CBC
HCT: 26 % — ABNORMAL LOW (ref 39.0–52.0)
MCH: 26.8 pg (ref 26.0–34.0)
MCHC: 30.4 g/dL (ref 30.0–36.0)
RDW: 19.5 % — ABNORMAL HIGH (ref 11.5–15.5)

## 2012-09-12 LAB — FERRITIN: Ferritin: 1418 ng/mL — ABNORMAL HIGH (ref 22–322)

## 2012-09-12 MED ORDER — AMOXICILLIN-POT CLAVULANATE 500-125 MG PO TABS: 1.0000 | ORAL_TABLET | Freq: Three times a day (TID) | ORAL | Status: DC

## 2012-09-12 MED ORDER — GABAPENTIN 100 MG PO CAPS
100.0000 mg | ORAL_CAPSULE | Freq: Every day | ORAL | Status: DC
  Administered 2012-09-12 – 2012-09-14 (×3): 100 mg via ORAL
  Filled 2012-09-12 (×4): qty 1

## 2012-09-12 MED ORDER — HEPARIN SODIUM (PORCINE) 5000 UNIT/ML IJ SOLN
5000.0000 [IU] | Freq: Three times a day (TID) | INTRAMUSCULAR | Status: DC
  Administered 2012-09-12 – 2012-09-15 (×10): 5000 [IU] via SUBCUTANEOUS
  Filled 2012-09-12 (×13): qty 1

## 2012-09-12 MED ORDER — AMOXICILLIN-POT CLAVULANATE 500-125 MG PO TABS
1.0000 | ORAL_TABLET | Freq: Every day | ORAL | Status: DC
  Administered 2012-09-12 – 2012-09-14 (×3): 500 mg via ORAL
  Filled 2012-09-12 (×4): qty 1

## 2012-09-12 MED ORDER — FENTANYL CITRATE 0.05 MG/ML IJ SOLN
50.0000 ug | Freq: Once | INTRAMUSCULAR | Status: AC
  Administered 2012-09-12: 50 ug via INTRAVENOUS
  Filled 2012-09-12: qty 2

## 2012-09-12 MED ORDER — INSULIN ASPART 100 UNIT/ML ~~LOC~~ SOLN: 0.0000 [IU] | Freq: Three times a day (TID) | SUBCUTANEOUS | Status: DC

## 2012-09-12 MED ORDER — PANTOPRAZOLE SODIUM 40 MG PO TBEC
40.0000 mg | DELAYED_RELEASE_TABLET | Freq: Every day | ORAL | Status: DC
  Administered 2012-09-12 – 2012-09-15 (×4): 40 mg via ORAL
  Filled 2012-09-12 (×4): qty 1

## 2012-09-12 MED ORDER — DULOXETINE HCL 20 MG PO CPEP
20.0000 mg | ORAL_CAPSULE | Freq: Every day | ORAL | Status: DC
  Administered 2012-09-12 – 2012-09-15 (×4): 20 mg via ORAL
  Filled 2012-09-12 (×4): qty 1

## 2012-09-12 NOTE — Progress Notes (Signed)
[facility] ROUNDING NOTE   ASSESSMENT/RECOMMENDATIONS  ESRD MWF [facility] Dr. Fausto Skillern Got extra treatment 6/19 for pulm edema HD  MWF schedule  New right TDC fem + new AVG (6/17) Weight is SEVERAL kg above stated outpt EDW not sure our hospital weights are correct as he does not appear THAT overloaded at this time (EDW 69.6/post last HD 74???) Resp failure Extubated S/p extra dialysis, thoracentesis 6/19 Chest tube for PTX 6/19 Sats good on nasal cannula CXR with small residual PTX Chest tube management per CCM Elevated cardiac enzymes Echo results as noted Cards feels small NSTEMI due to hemodynamic stress in setting of chronically occluded RCA and ESRD but with stable LV function no  additional workup needed  Anemia Hb down to 7.9 Darbepoetin 100 qweek (6/21) Weekly iron as per outpt schedule (ferrlecit for venofer) QMon Fever Afebrile now RLL infiltrate/effusion + decub Cultures pending (all neg as of 6/22) On Vanco and zosyn-->changed to Augmentin (6/22) Hypoglycemia Resolved Encephalopathy Resolved and at baseline mental status Not sure what primary culprit was (multiple - hypoxemia, hyperecarbia, ?infection) Secondary HPT Resumed sensipar and binders (6/21)  Hand numbness and tingling ?neuropathic Primary has started gabapentin/prn pain meds   Subjective:  Awake, alert, complains of bilateral hand numbness On nasal O2 Chest tube still with serosanguinous drainage  Objective Vital signs in last 24 hours: Filed Vitals:   09/12/12 0400 09/12/12 0600 09/12/12 0737 09/12/12 0800  BP: 132/36 118/45 143/52 108/57  Pulse: 67 76 75 81  Temp:   98.6 F (37 C)   TempSrc:   Oral   Resp: 13 13 12 13   Height:      Weight:      SpO2: 100% 96% 91% 97%   Weight change: 2.9 kg (6 lb 6.3 oz)  Intake/Output Summary (Last 24 hours) at 09/12/12 4098 Last data filed at 09/12/12 0834  Gross per 24 hour  Intake    420 ml  Output   1680 ml  Net  -1260 ml    Physical Exam: BP 108/57  Pulse 81  Temp(Src) 98.6 F (37 C) (Oral)  Resp 13  Ht 5\' 6"  (1.676 m)  Wt 76.4 kg (168 lb 6.9 oz)  BMI 27.2 kg/m2  SpO2 97% Bearded BM Awake and alert on nasal cannula in NAD Line in left neck Right chest tube with blood tinged fluid  Lungs ant clear Regular rhythm  S1S2 No S3 2/6 murmur USB No pericardial rub Left upper arm AVG (new 6/17) + bruit and thrill Kerlix wrapped Abdomen non tender Right femoral TDC clean and dry Trace edema of LE's Left TMA Right absent great toe  Dry changes left heel, right great toe amp site   Recent Labs Lab 09/07/12 0704 09/07/12 0719 09/08/12 1930 09/09/12 0630 09/10/12 0425 09/11/12 0515 09/12/12 0420  NA 135 135 139 131* 135 131* 134*  K 6.2* 5.1 5.7* 4.5 3.4* 3.2* 3.3*  CL  --   --  96 92* 93* 89* 95*  CO2  --   --  21 25 27 26 27   GLUCOSE 86 98 31* 142* 121* 113* 85  BUN  --   --  98* 42* 21 28* 16  CREATININE  --   --  14.25* 8.11* 5.64* 7.65* 4.68*  CALCIUM  --   --  9.2 9.3 9.3 8.8 8.4  PHOS  --   --  9.8*  --  4.5 6.4* 4.5    Recent Labs Lab 09/09/12 1344 09/10/12 0425 09/11/12  0515 09/12/12 0420  PROT 6.6  --   --   --   ALBUMIN  --  2.1* 1.9* 1.9*  CBC:  Recent Labs Lab 09/09/12 0630 09/10/12 0425 09/11/12 0515 09/12/12 0420  WBC 8.6 6.7 5.8 5.0  HGB 9.5* 8.1* 7.9* 7.9*  HCT 30.5* 26.2* 25.2* 26.0*  MCV 86.2 84.2 85.4 88.1  PLT 152 135* 167 174     Recent Labs Lab 09/08/12 1930 09/09/12 0010 09/09/12 0730 09/09/12 1344 09/10/12 0425  TROPONINI 3.16* 4.24* 3.22* 3.43* 4.26*   CBG:  Recent Labs Lab 09/11/12 1619 09/11/12 1941 09/12/12 0106 09/12/12 0335 09/12/12 0741  GLUCAP 133* 102* 89 92 75  Results for YOJAN, PASKETT (MRN [id number]) as of 09/12/2012 09:20  Ref. Range 09/11/2012 05:15  Iron Latest Range: 42-135 ug/dL 24 (L)  UIBC Latest Range: 125-400 ug/dL 74 (L)  TIBC Latest Range: 215-435 ug/dL 98 (L)  Saturation Ratios Latest Range: 20-55 % 24   Ferritin Latest Range: 22-322 ng/mL 1418 (H)    Studies/Results: Dg Chest Port 1 View  09/11/2012   *RADIOLOGY REPORT*  Clinical Data: Pneumothorax, right chest tube  PORTABLE CHEST - 1 VIEW  Comparison: Portable exam 0612 hours compared to 09/10/2012  Findings: Pigtail right thoracostomy tube again seen with small residual right basilar pneumothorax little change from previous study. Enlargement of cardiac silhouette. Femoral line tip projects over right atrium. Diffuse bilateral pulmonary infiltrates question edema versus infection. Bones unremarkable.  IMPRESSION: Persistent small basilar right pneumothorax despite thoracostomy tube. Bilateral pulmonary infiltrates, little changed.   Original Report Authenticated By: Ulyses Southward, M.D.   Dg Chest Port 1 View  09/10/2012   *RADIOLOGY REPORT*  Clinical Data: Follow up infiltrates and pneumothorax  PORTABLE CHEST - 1 VIEW  Comparison: 09/09/2012  Findings: Support apparatus including the endotracheal tube, nasogastric tube and dual lumen, femoral placed central venous line are stable.  Right-sided pigtail catheter, superimposed over the right atrium is also stable.  Small inferolateral pneumothorax is unchanged.  Hazy areas of airspace lung opacity and prominence of the bronchovascular markings is also unchanged allowing for differences in patient positioning and radiographic technique.  More dense consolidation is noted at the right lung base.  IMPRESSION: Stable appearance from the previous day's study.  Persistent small right inferolateral pneumothorax.  Hazy airspace lung opacities described previously are also stable, likely due to airspace edema.  No new abnormalities.   Original Report Authenticated By: Amie Portland, M.D.   2D echo LVEF is approximately 50% with inferior/inferoseptal hypokinesis. The cavity size was normal. Wall thickness was increased in a pattern of mild LVH. Systolic function was normal. The estimated ejection fraction was in  the range of 50% to 55%  Cultures of blood, sputum and pleural fluid all negative to date  Medications: . sodium chloride 10 mL/hr at 09/08/12 1930   . amoxicillin-clavulanate  1 tablet Oral Daily  . antiseptic oral rinse  15 mL Mouth Rinse q12n4p  . aspirin  81 mg Oral Daily  . atorvastatin  80 mg Oral q1800  . calcium acetate  2,001 mg Oral TID WC  . cinacalcet  30 mg Oral Q breakfast  . [START ON 09/17/2012] darbepoetin (ARANESP) injection - DIALYSIS  100 mcg Intravenous Q Fri-HD  . [START ON 09/13/2012] doxercalciferol  3.5 mcg Intravenous Q M,W,F-HD  . DULoxetine  20 mg Oral Daily  . [START ON 09/13/2012] ferric gluconate (FERRLECIT/NULECIT) IV  125 mg Intravenous Q Mon-HD  . gabapentin  100 mg Oral QHS  .  heparin subcutaneous  5,000 Units Subcutaneous Q8H  . insulin aspart  0-9 Units Subcutaneous TID WC  . lanthanum  2,000 mg Oral TID WC  . pantoprazole  40 mg Oral Daily    I  have reviewed scheduled and prn medications.  Outpatient Dialysis orders: MWF [facility]  3 1/2 hours EDW 69.5 kg BFR 450 DFR600 2K2.5 Calcium 3000 heparin bolus Hectorol 3.5 EPO 6000 Venofer 50/week   Camille Bal, MD [facility] [phone] pager 09/12/2012, 9:22 AM

## 2012-09-12 NOTE — Progress Notes (Signed)
Received to unit from 2900.  IV sites unremarkable.  Dressing to chest tube dry and intact. Chest tube patent, drainage serosanguineous.  Diatek cath to right groin intact with no bleeding from site.  Chest tube connected to 10 cm pressure as ordered.  Multiple scars from previous surgeries.  Alert and oriented.  O2 via nasal cannula at 2L/min.  Respiratory exchange even and unlabored.  Able to mobilize self in bed.  Moving all four extremities.

## 2012-09-12 NOTE — Progress Notes (Signed)
PULMONARY  / CRITICAL CARE MEDICINE  Name: CATHAN GEARIN MRN: [id number] DOB: [birth year]    ADMISSION DATE:  09/08/2012  REFERRING MD :  ER  CHIEF COMPLAINT: Change in mental status  BRIEF PATIENT DESCRIPTION:  62 yo male brought to ED by family with change in mental status.  Noted to have fever, and hyperkalemia.  Transferred from [facility] to [facility] for emergent HD. ABG was 7.33/49/68 on 3 liters.  UDS was negative.  Lactic acid was 1.9.  Troponin was 3.92.  ON 6/17 he had an access procedure with ligation of nonmaturing LUA BVT, placement new LUA AV graft and placement of new R femoral tunneled HD cath.     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  Denies chest pain.  C/o burning pain in hands and legs.  VITAL SIGNS: Temp:  [97 F (36.1 C)-99.2 F (37.3 C)] 98.6 F (37 C) (06/22 0737) Pulse Rate:  [44-84] 75 (06/22 0737) Resp:  [11-19] 12 (06/22 0737) BP: (57-150)/(23-68) 143/52 mmHg (06/22 0737) SpO2:  [90 %-100 %] 91 % (06/22 0737) Weight:  [168 lb 6.9 oz (76.4 kg)] 168 lb 6.9 oz (76.4 kg) (06/22 0300)   INTAKE / OUTPUT: Intake/Output     06/21 0701 - 06/22 0700 06/22 0701 - 06/23 0700   P.O.  120   I.V. (mL/kg)     IV Piggyback 300    Total Intake(mL/kg) 300 (3.9) 120 (1.6)   Other 1450    Chest Tube 230    Total Output 1680     Net -1380 +120          PHYSICAL EXAMINATION: General:wd male in nad Neuro: Wide awake, follows commands, Moves all 4. HEENT: nose without purulence or d/c, no TMG or LN Cardiovascular:  regular, no murmur Lungs: mildly decreased bs, no wheezing, scattered crackles, Rt chest tube in place Abdomen:  Soft, + bowel sounds, non tender Musculoskeletal:  Lt and Rt TMA, no edema, AV graft Lt arm, HD cath Rt femoral region  LABS:  Recent Labs Lab 09/10/12 0425 09/11/12 0515 09/12/12 0420  HGB 8.1* 7.9* 7.9*  HCT 26.2* 25.2* 26.0*  WBC 6.7 5.8 5.0  PLT 135* 167 174    Recent Labs Lab 09/08/12 1930 09/09/12 0630 09/10/12 0425 09/11/12 0515 09/12/12 0420  NA 139 131* 135 131* 134*  K 5.7* 4.5 3.4* 3.2* 3.3*  CL 96 92* 93* 89* 95*  CO2 21 25 27 26 27   GLUCOSE 31* 142* 121* 113* 85  BUN 98* 42* 21 28* 16  CREATININE 14.25* 8.11* 5.64* 7.65* 4.68*  CALCIUM 9.2 9.3 9.3 8.8 8.4  PHOS 9.8*  --  4.5 6.4* 4.5    Recent Labs Lab 09/08/12 1930 09/09/12 1344 09/10/12 0425 09/11/12 0515 09/12/12 0420  PROT  --  6.6  --   --   --   ALBUMIN 2.4*  --  2.1* 1.9* 1.9*    Recent Labs Lab 09/09/12 0114 09/09/12 0505 09/09/12 1319 09/10/12 0338  PHART 7.374 7.323* 7.333* 7.537*  PCO2ART  52.8* 55.9* 54.2* 34.1*  PO2ART 370.0* 66.0* 308.0* 144.0*  HCO3 30.6* 29.0* 28.7* 28.6*  TCO2 32 31 30 30   O2SAT 100.0 91.0 100.0 99.0      Recent Labs Lab 09/11/12 1619 09/11/12 1941 09/12/12 0106 09/12/12 0335 09/12/12 0741  GLUCAP 133* 102* 89 92 75    Imaging: Dg Chest Port 1 View  09/11/2012   *RADIOLOGY REPORT*  Clinical Data: Pneumothorax, right chest tube  PORTABLE CHEST - 1 VIEW  Comparison: Portable exam 0612 hours compared to 09/10/2012  Findings: Pigtail right thoracostomy tube again seen with small residual right basilar pneumothorax little change from previous study. Enlargement of cardiac silhouette. Femoral line tip projects over right atrium. Diffuse bilateral pulmonary infiltrates question edema versus infection. Bones unremarkable.  IMPRESSION: Persistent small basilar right pneumothorax despite thoracostomy tube. Bilateral pulmonary infiltrates, little  changed.   Original Report Authenticated By: Ulyses Southward, M.D.   Dg Chest Port 1 View  09/10/2012   *RADIOLOGY REPORT*  Clinical Data: Follow up infiltrates and pneumothorax  PORTABLE CHEST - 1 VIEW  Comparison: 09/09/2012  Findings: Support apparatus including the endotracheal tube, nasogastric tube and dual lumen, femoral placed central venous line are stable.  Right-sided pigtail catheter, superimposed over the right atrium is also stable.  Small inferolateral pneumothorax is unchanged.  Hazy areas of airspace lung opacity and prominence of the bronchovascular markings is also unchanged allowing for differences in patient positioning and radiographic technique.  More dense consolidation is noted at the right lung base.  IMPRESSION: Stable appearance from the previous day's study.  Persistent small right inferolateral pneumothorax.  Hazy airspace lung opacities described previously are also stable, likely due to airspace edema.  No new abnormalities.   Original Report Authenticated By: Amie Portland, M.D.     ASSESSMENT / PLAN:  PULMONARY A: Hypoxemia with progressive chronic Rt effusion and B infiltrates.  Likely also has some degree of hypoventilation. The pt has bilat pulmonary infiltrates of unknown origin.  Cultures unhelpful so far.  He is continuing on broad spectrum abx for possible infection. Thoracentesis from 08/15/11 >> protein 3.2, LDH 155, WBC 214, reactive mesothelial cells.  Culture negative on fluid so far.  He is tolerating extubation well on low flow oxygen with no increased wob. P:   -continue chest tube to suction -f/u CXR  CARDIOVASCULAR A: Elevated cardiac enzymes >> uncertain significance in setting of renal failure. Hx of PVD, HTN, Diastolic heart failure. P:  -fluid management using HD -probable CAD, w/u to unfold once acute events resolve, appreciate cardiology's help   RENAL A:  ESRD. Hyperkalemia, resolved P:   -HD per renal -electrolyte replacement as  indicated   INFECTIOUS A:  Fever, progressive Rt infiltrate/effusion, healed sacral decubitus ulcer. P:   -change to po augmentin  ENDOCRINE A: Hypoglycemia.  Suspect related to poor insulin clearance, renal status P:   -monitor blood sugars  NEUROLOGY A: Neuropathic pain. P: -Add cymbalta -resume neurontin  Transfer to telemetry.    Coralyn Helling, MD [facility] Pulmonary/Critical Care 09/12/2012, 8:56 AM Pager:  [phone] After 3pm call: [phone]

## 2012-09-12 NOTE — Progress Notes (Signed)
Report called to [facility] on 4700. Patient being transferred to 4737.

## 2012-09-12 NOTE — Progress Notes (Signed)
Patient ID: [name], male   DOB: 1951-02-24, 62 y.o.   MRN: [id number]   SUBJECTIVE:   Echo reviewed personally. 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 (LAD 50%, LCX ok at that time)  Denies CP or dyspnea. Wants to go home.    Filed Vitals:   09/12/12 0400 09/12/12 0600 09/12/12 0737 09/12/12 0800  BP: 132/36 118/45 143/52 108/57  Pulse: 67 76 75 81  Temp:   98.6 F (37 C)   TempSrc:   Oral   Resp: 13 13 12 13   Height:      Weight:      SpO2: 100% 96% 91% 97%    Intake/Output Summary (Last 24 hours) at 09/12/12 0856 Last data filed at 09/12/12 0834  Gross per 24 hour  Intake    420 ml  Output   1680 ml  Net  -1260 ml    Physical exam  General: NAD  Neck: JVP mildly elevated no thyromegaly or thyroid nodule.  Lungs: Rhonchi bilaterally . Left chest tube CV: Nondisplaced PMI. Heart regular S1/S2, no S3/S4, no murmur. No peripheral edema. No carotid bruit.  Abdomen: Soft, nontender, no hepatosplenomegaly, no distention.  Skin: Intact without lesions or rashes.  Neurologic: Awake, confused.  Psych: Normal affect.  Extremities: No clubbing or cyanosis. S/p transmetatarsal amp HEENT: Normal.    LABS: Basic Metabolic Panel:  Recent Labs  40/98/11 0515 09/12/12 0420  NA 131* 134*  K 3.2* 3.3*  CL 89* 95*  CO2 26 27  GLUCOSE 113* 85  BUN 28* 16  CREATININE 7.65* 4.68*  CALCIUM 8.8 8.4  PHOS 6.4* 4.5   Liver Function Tests:  Recent Labs  09/09/12 1344  09/11/12 0515 09/12/12 0420  PROT 6.6  --   --   --   ALBUMIN  --   < > 1.9* 1.9*  < > = values in this interval not displayed. No results found for this basename: LIPASE, AMYLASE,  in the last 72 hours CBC:  Recent Labs  09/11/12 0515 09/12/12 0420  WBC 5.8 5.0  HGB 7.9* 7.9*  HCT 25.2* 26.0*  MCV 85.4 88.1  PLT 167 174   Cardiac Enzymes:  Recent Labs  09/09/12 1344 09/10/12 0425  TROPONINI 3.43* 4.26*   BNP: No components found  with this basename: POCBNP,  D-Dimer: No results found for this basename: DDIMER,  in the last 72 hours Hemoglobin A1C: No results found for this basename: HGBA1C,  in the last 72 hours Fasting Lipid Panel: No results found for this basename: CHOL, HDL, LDLCALC, TRIG, CHOLHDL, LDLDIRECT,  in the last 72 hours Thyroid Function Tests: No results found for this basename: TSH, T4TOTAL, FREET3, T3FREE, THYROIDAB,  in the last 72 hours  RADIOLOGY: Dg Chest 2 View    Dg Chest Port 1 View  09/10/2012   *RADIOLOGY REPORT*  Clinical Data: Follow up infiltrates and pneumothorax  PORTABLE CHEST - 1 VIEW  Comparison: 09/09/2012  Findings: Support apparatus including the endotracheal tube, nasogastric tube and dual lumen, femoral placed central venous line are stable.  Right-sided pigtail catheter, superimposed over the right atrium is also stable.  Small inferolateral pneumothorax is unchanged.  Hazy areas of airspace lung opacity and prominence of the bronchovascular markings is also unchanged allowing for differences in patient positioning and radiographic technique.  More dense consolidation is noted at the right lung base.  IMPRESSION: Stable appearance from the previous day's study.  Persistent  small right inferolateral pneumothorax.  Hazy airspace lung opacities described previously are also stable, likely due to airspace edema.  No new abnormalities.   Original Report Authenticated By: Amie Portland, M.D.   Dg Chest Port 1 View  09/09/2012   *RADIOLOGY REPORT*  Clinical Data: Right chest tube  PORTABLE CHEST - 1 VIEW  Comparison: 09/09/2012  Findings: Pigtail drainage catheter has been placed overlying the right lung base.  Decrease in right basilar pneumothorax.  There remains a small right basilar pneumothorax.  Endotracheal tube in good position.  NG tube enters the stomach. Dual lumen femoral catheter enters the right atrium, unchanged.  Progression of diffuse bilateral airspace disease most likely  edema.  Right lower lobe consolidation is present  IMPRESSION: Right pigtail drainage catheter has been placed in good position. Decrease in right basilar pneumothorax.  Progression of bilateral edema.   Original Report Authenticated By: Janeece Riggers, M.D.     TELEMETRY:   I reviewed 09/12/2012.  NSR 70s  ASSESSMENT     Acute encephalopathy   NSTEMI   End stage renal disease   Pleural effusion s/p L chest tube   Pneumonia   Hyperkalemia   CAD per cath 5/13   Acute respiratory failure with hypercapnia  PLAN:  I suspect he had a small NSTEMI due to hemodynamic stress in setting of chronically occluded RCA and ESRD. He is currently asymptomatic. LV function is stable. No further cardiac w/u needed. Continue RF management. We will sign off. Please call with questions.   Arvilla Meres 09/12/2012 8:56 AM

## 2012-09-13 ENCOUNTER — Inpatient Hospital Stay (HOSPITAL_COMMUNITY): Payer: Medicare Other

## 2012-09-13 DIAGNOSIS — R4182 Altered mental status, unspecified: Secondary | ICD-10-CM

## 2012-09-13 LAB — RENAL FUNCTION PANEL
CO2: 26 mEq/L (ref 19–32)
Calcium: 8.5 mg/dL (ref 8.4–10.5)
GFR calc Af Amer: 10 mL/min — ABNORMAL LOW (ref >90)
GFR calc non Af Amer: 8 mL/min — ABNORMAL LOW (ref >90)
Phosphorus: 4.8 mg/dL — ABNORMAL HIGH (ref 2.3–4.6)
Potassium: 4 mEq/L (ref 3.5–5.1)
Sodium: 135 mEq/L (ref 135–145)

## 2012-09-13 LAB — CBC
Hemoglobin: 8.4 g/dL — ABNORMAL LOW (ref 13.0–17.0)
MCHC: 29.7 g/dL — ABNORMAL LOW (ref 30.0–36.0)
RDW: 19.3 % — ABNORMAL HIGH (ref 11.5–15.5)
WBC: 6.4 10*3/uL (ref 4.0–10.5)

## 2012-09-13 LAB — GLUCOSE, CAPILLARY: Glucose-Capillary: 40 mg/dL — CL (ref 70–99)

## 2012-09-13 MED ORDER — ONDANSETRON HCL 4 MG/2ML IJ SOLN
4.0000 mg | Freq: Three times a day (TID) | INTRAMUSCULAR | Status: DC | PRN
  Administered 2012-09-13: 4 mg via INTRAVENOUS
  Filled 2012-09-13: qty 2

## 2012-09-13 MED ORDER — DOXERCALCIFEROL 4 MCG/2ML IV SOLN
INTRAVENOUS | Status: AC
  Filled 2012-09-13: qty 2

## 2012-09-13 MED ORDER — HYDRALAZINE HCL 10 MG PO TABS
10.0000 mg | ORAL_TABLET | Freq: Three times a day (TID) | ORAL | Status: DC
  Administered 2012-09-13 – 2012-09-14 (×2): 10 mg via ORAL
  Filled 2012-09-13 (×6): qty 1

## 2012-09-13 MED ORDER — SODIUM CHLORIDE 0.9 % IV SOLN: INTRAVENOUS | Status: DC | PRN

## 2012-09-13 MED ORDER — ISOSORBIDE MONONITRATE 15 MG HALF TABLET
15.0000 mg | ORAL_TABLET | Freq: Every day | ORAL | Status: DC
Start: 2012-09-13 — End: 2012-09-15
  Administered 2012-09-13 – 2012-09-15 (×3): 15 mg via ORAL
  Filled 2012-09-13 (×3): qty 1

## 2012-09-13 MED ORDER — OXYCODONE-ACETAMINOPHEN 5-325 MG PO TABS
ORAL_TABLET | ORAL | Status: AC
  Filled 2012-09-13: qty 1

## 2012-09-13 MED ORDER — ONDANSETRON HCL 4 MG/2ML IJ SOLN
INTRAMUSCULAR | Status: AC
  Filled 2012-09-13: qty 2

## 2012-09-13 NOTE — Progress Notes (Signed)
R pigtail Chest tube removed.  PT tol well.  F/u CXR pending.    Danford Bad, NP 09/13/2012  11:10 AM Pager: [phone] or [phone]

## 2012-09-13 NOTE — Progress Notes (Signed)
Pt has small serous drainage from Lt upper arm graft Rn change dress. 6-23 1716

## 2012-09-13 NOTE — Progress Notes (Signed)
Pt complain of nausea Zofran giving. Pt did not eat breakfast and refuse renal meds that are to be taking with meds.

## 2012-09-13 NOTE — Progress Notes (Signed)
Pt. Complaining of nausea.  No prn medications for nausea at this time.  MD at e-link made aware.  New orders received.  Will continue to monitor patient.

## 2012-09-13 NOTE — Progress Notes (Signed)
PULMONARY  / CRITICAL CARE MEDICINE  Name: [name] MRN: [id number] DOB: 1950-05-05    ADMISSION DATE:  09/08/2012  REFERRING MD :  ER  CHIEF COMPLAINT: Change in mental status  BRIEF PATIENT DESCRIPTION:  62 yo male brought to ED by family with change in mental status.  Noted to have fever, and hyperkalemia.  Transferred from [facility] to [facility] for emergent HD.  ABG was 7.33/49/68 on 3 liters.  UDS was negative.  Lactic acid was 1.9.  Troponin was 3.92. On 6/17 he had an access procedure with ligation of nonmaturing LUA BVT, placement new LUA AV graft and placement of new R femoral tunneled HD cath.   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  Denies chest pain, SOB.    VITAL SIGNS: Temp:  [97.9 F (36.6 C)-98.6 F (37 C)] 98.5 F (36.9 C) (06/23 0541) Pulse Rate:  [66-81] 80 (06/23 0900) Resp:  [18] 18 (06/23 0900) BP: (82-155)/(35-54) 121/41 mmHg (06/23 0900) SpO2:  [88 %-100 %] 88 % (06/23 0900) Weight:  [162 lb 4.1 oz (73.6 kg)-162 lb 7.7 oz (73.7 kg)] 162 lb 4.1 oz (73.6 kg) (06/23 0541)   INTAKE / OUTPUT: Intake/Output     06/22 0701 - 06/23 0700 06/23 0701 - 06/24 0700   P.O. 360    IV Piggyback     Total Intake(mL/kg) 360 (4.9)    Other     Chest Tube 50    Total Output 50     Net +310          Urine Occurrence 1 x    Stool Occurrence 1 x    Emesis Occurrence 1 x      PHYSICAL EXAMINATION: General:wd male in nad Neuro: Wide awake,  follows commands, Moves all 4. HEENT: mm dry, nose without purulence or d/c, no TMG or LN Cardiovascular: regular, no murmur Lungs: mildly decreased bs, no wheezing, scattered crackles, Rt chest tube in place Abdomen:  Soft, + bowel sounds, non tender, no hsm Musculoskeletal:  Lt and Rt TMA, no edema, AV graft Lt arm, HD cath Rt femoral region  LABS:  Recent Labs Lab 09/11/12 0515 09/12/12 0420 09/13/12 0505  HGB 7.9* 7.9* 8.4*  HCT 25.2* 26.0* 28.3*  WBC 5.8 5.0 6.4  PLT 167 174 234    Recent Labs Lab 09/08/12 1930 09/09/12 0630 09/10/12 0425 09/11/12 0515 09/12/12 0420 09/13/12 0505  NA 139 131* 135 131* 134* 135  K 5.7* 4.5 3.4* 3.2* 3.3* 4.0  CL 96 92* 93* 89* 95* 95*  CO2 21 25 27 26 27 26   GLUCOSE 31* 142* 121* 113* 85 90  BUN 98* 42* 21 28* 16 24*  CREATININE 14.25* 8.11* 5.64* 7.65* 4.68* 6.38*  CALCIUM 9.2 9.3 9.3 8.8 8.4 8.5  PHOS 9.8*  --  4.5 6.4* 4.5 4.8*    Recent Labs Lab 09/08/12 1930 09/09/12 1344 09/10/12 0425 09/11/12 0515 09/12/12 0420 09/13/12 0505  PROT  --  6.6  --   --   --   --   ALBUMIN 2.4*  --  2.1* 1.9* 1.9* 2.2*    Recent Labs Lab 09/09/12 0114 09/09/12 0505 09/09/12 1319 09/10/12 0338  PHART 7.374 7.323* 7.333* 7.537*  PCO2ART 52.8* 55.9* 54.2* 34.1*  PO2ART 370.0* 66.0* 308.0* 144.0*  HCO3 30.6* 29.0* 28.7* 28.6*  TCO2 32 31 30 30   O2SAT 100.0 91.0 100.0 99.0      Recent Labs Lab 09/12/12 0741 09/12/12 1159 09/12/12 1540 09/12/12 2145 09/13/12 0634  GLUCAP 75 105* 105* 93 77    Imaging: Dg Chest Port 1 View  09/13/2012   *RADIOLOGY REPORT*  Clinical Data: Follow up chest tube  PORTABLE CHEST - 1 VIEW  Comparison: Prior chest x-ray 06/21 1014  Findings: Right pigtail thoracostomy tube remains in unchanged position.  There is a femoral approach split tip hemodialysis catheter with the tips projecting over the right atrium.  There is a small loculated right hydropneumothorax.  Trace left pleural effusion.   Background pulmonary edema has improved.  There is residual pulmonary vascular congestion.  Atherosclerotic calcifications noted in the transverse aorta. Stable heart size.  IMPRESSION:  1.  Unchanged right basilar hydropneumothorax with a pigtail thoracostomy tube in place. 2.  Improving pulmonary edema with residual mild vascular congestion.   Original Report Authenticated By: Malachy Moan, M.D.     ASSESSMENT / PLAN:  A: Acute respiratory failure 2nd to hypervolemia and hypoventilation in setting renal failure with volume overload and aspiration pneumonia. Chronic right pleural effusion with loculation.  Pneumothorax Ex Vacuo after thoracentesis 6/19 >> this is chronic. P:   -d/c chest tube 6/23 >> not serving any purpose -D6/7 Abx, current on augmentin  A: NSTEMI due to hyperdynamic stress with chronically occluded RCA>> preserved LV systolic fx; no further cardiac w/u at this time. Hx of PVD, HTN, Diastolic heart failure, hyperlipidemia. P:  -continue ASA, lipitor -resume imdur, hydralazine >> titrate doses up to outpt regimen as tolerated  A:  ESRD. Hyperkalemia, resolved P:   -HD per renal  A: Healed sacral decubitus ulcer. P:   -wound care  A: Neuropathic pain. P: -continue cymbalta, neurontin  A: Deconditioning >> PT recommending SNF. P: -consult social worker for SNF placement  [facility], NP 09/13/2012  9:42 AM Pager: [phone] or [phone]   Reviewed above, examined pt, and agree with assessment/plan.  He has chronic loculated Rt pleural effusion.  This will not likely resolve w/o surgical intervention.  He fluid culture and cytology results are both negative.  He has not clinical impact from trapped lung.  Will d/c chest tube and monitor clinically and on f/u chest xray.  Will consult social work to assess for SNF placement >> if he is stable after d/c of chest tube, then he will be ready for transfer to SNF when bed available.  Coralyn Helling, MD [facility] Pulmonary/Critical Care 09/13/2012, 11:07 AM Pager:  [phone] After 3pm call: [phone]

## 2012-09-13 NOTE — Progress Notes (Signed)
Physical Therapy Treatment Patient Details Name: [name] MRN: [id number] DOB: 08-31-1950 Today's Date: 09/13/2012 Time: 8469-6295 PT Time Calculation (min): 23 min  PT Assessment / Plan / Recommendation Comments on Treatment Session  Pt. was limited by pain and fatigue. Pt. required frequent VC for hand and body placement to ensure effecient movement. Continue to recommend d/c to SNF in order to improve functional ability.     Follow Up Recommendations  SNF     Does the patient have the potential to tolerate intense rehabilitation     Barriers to Discharge        Equipment Recommendations  Rolling walker with 5" wheels    Recommendations for Other Services    Frequency Min 3X/week   Plan Discharge plan remains appropriate    Precautions / Restrictions Precautions Precautions: Fall Restrictions Weight Bearing Restrictions: No   Pertinent Vitals/Pain Patient reported generalized pain throughout his body.   02 sats 95% RA at rest.  95% RA after 20' ambulating.  88% RA after ambulation but recovered back to 94-95% RA with pursed lip breathing.      Mobility  Bed Mobility Bed Mobility: Supine to Sit Supine to Sit: 4: Min guard;HOB elevated;With rails Details for Bed Mobility Assistance: Pt. required cueing for hand placement and safe movement.  Transfers Transfers: Sit to Stand;Stand to Sit Sit to Stand: 4: Min guard;With upper extremity assist;From bed;From chair/3-in-1;With armrests Stand to Sit: 4: Min guard;With upper extremity assist;With armrests;To chair/3-in-1 Details for Transfer Assistance: Cues for hand and body placement to increase efficiency and safety of transfer.  Ambulation/Gait Ambulation/Gait Assistance: 4: Min assist Ambulation Distance (Feet): 40 Feet (2 rest breaks first 20', none last 20') Assistive device: Rolling walker Ambulation/Gait Assistance Details: Pt. c/o pain and weakness in his UE and LE. Pt. was unable to take a full grip of the  walker with his LUE due to pain. He reported that he had persistant pain in his left hand. Pt. Ambulated on room air, with O2 Sat of 95% after 20' of ambulating.  Gait Pattern: Step-to pattern;Decreased stride length;Antalgic Gait velocity: decr Stairs: No    Exercises General Exercises - Lower Extremity Long Arc Quad: AROM;Both;10 reps;Seated Hip Flexion/Marching: AROM;Both;10 reps;Seated     PT Goals Acute Rehab PT Goals Time For Goal Achievement: 09/25/12 Potential to Achieve Goals: Good Pt will go Supine/Side to Sit: with supervision;with HOB 0 degrees PT Goal: Supine/Side to Sit - Progress: Progressing toward goal Pt will go Sit to Stand: with supervision;with upper extremity assist PT Goal: Sit to Stand - Progress: Progressing toward goal Pt will Ambulate: 51 - 150 feet;with supervision;with rolling walker PT Goal: Ambulate - Progress: Progressing toward goal  Visit Information  Last PT Received On: 09/13/12 Assistance Needed: +2 (to follow with recliner, pt. required multiple rest breaks.)    Subjective Data  Subjective: Pt received supine in bed agreeable to PT. Pt stated that he does not use a walker at home.    Cognition  Cognition Arousal/Alertness: Awake/alert Behavior During Therapy: WFL for tasks assessed/performed Overall Cognitive Status: Within Functional Limits for tasks assessed    Balance     End of Session PT - End of Session Equipment Utilized During Treatment: Gait belt Activity Tolerance: Patient limited by fatigue;Patient limited by pain Patient left: in chair;with call bell/phone within reach Nurse Communication: Mobility status   GP     Jolyn Nap, SPTA 09/13/2012, 10:27 AM

## 2012-09-13 NOTE — Procedures (Signed)
Hemodialysis today with low BP limiting UF.  Green light. Cont as tolerated.  Pt alert and appropriate.  Access via right groin PC. Left arm AVG place 6/17. [name] C

## 2012-09-13 NOTE — Progress Notes (Signed)
Rec PT back from HD. Pt giving pain med by HD before transfer. Pt no complains, NT help Pt to bathroom

## 2012-09-14 ENCOUNTER — Inpatient Hospital Stay (HOSPITAL_COMMUNITY): Payer: Medicare Other

## 2012-09-14 LAB — BASIC METABOLIC PANEL
BUN: 12 mg/dL (ref 6–23)
CO2: 31 mEq/L (ref 19–32)
Calcium: 8.3 mg/dL — ABNORMAL LOW (ref 8.4–10.5)
Creatinine, Ser: 4.06 mg/dL — ABNORMAL HIGH (ref 0.50–1.35)
Glucose, Bld: 87 mg/dL (ref 70–99)

## 2012-09-14 LAB — CBC
Hemoglobin: 8 g/dL — ABNORMAL LOW (ref 13.0–17.0)
MCH: 26.1 pg (ref 26.0–34.0)
MCV: 86.3 fL (ref 78.0–100.0)
RBC: 3.06 MIL/uL — ABNORMAL LOW (ref 4.22–5.81)

## 2012-09-14 MED ORDER — HYDRALAZINE HCL 25 MG PO TABS
25.0000 mg | ORAL_TABLET | Freq: Three times a day (TID) | ORAL | Status: DC
  Administered 2012-09-14 – 2012-09-15 (×3): 25 mg via ORAL
  Filled 2012-09-14 (×6): qty 1

## 2012-09-14 NOTE — Plan of Care (Signed)
Problem: Phase I Progression Outcomes Goal: OOB as tolerated unless otherwise ordered Outcome: Completed/Met Date Met:  09/14/12 Pt oob to chair with assist/supervision x 1, tolerated well Goal: Voiding-avoid urinary catheter unless indicated Outcome: Completed/Met Date Met:  09/14/12 Pt does not void, no need for foley

## 2012-09-14 NOTE — Progress Notes (Signed)
Physical Therapy Treatment Patient Details Name: [name] MRN: [id number] DOB: [birth year] Today's Date: 09/14/2012 Time: 7829-5621 PT Time Calculation (min): 27 min  PT Assessment / Plan / Recommendation Comments on Treatment Session  Pt. was much stronger during today's treatment, demonstrated by increased ambulation distance without the need for rest breaks. Pt. made reports of tingling and numbness in both hands prior to Tx. Pt. stated that numbness in his hands was common PTA.  Pt lives alone & was completely independent with mobility PTA.  Although he is progressing he still is not at independent level therefore Pt would continue to benefit from SNF at d/c in order to increase his functional independence.     Follow Up Recommendations  SNF     Does the patient have the potential to tolerate intense rehabilitation     Barriers to Discharge        Equipment Recommendations  Rolling walker with 5" wheels    Recommendations for Other Services    Frequency Min 3X/week   Plan Discharge plan remains appropriate    Precautions / Restrictions Precautions Precautions: Fall Restrictions Weight Bearing Restrictions: No   Pertinent Vitals/Pain Patient's BP was 156/63 prior to treatment. Pt was asymptomatic. Tolerated session well.  Pt c/o numbness in bil hands.     Mobility  Bed Mobility Bed Mobility: Supine to Sit Supine to Sit: 4: Min guard;HOB elevated;With rails Details for Bed Mobility Assistance: Pt. required cueing for safe postioning at EOB.  Transfers Transfers: Sit to Stand;Stand to Sit Sit to Stand: 4: Min guard;With upper extremity assist;From bed Stand to Sit: 4: Min guard;With upper extremity assist;With armrests;To chair/3-in-1 Details for Transfer Assistance: Pt. required min guard due to impulsivity and ignoring VC to perform transfers safely.  Ambulation/Gait Ambulation/Gait Assistance: 4: Min guard Ambulation Distance (Feet): 180 Feet Assistive device:  Rolling walker Ambulation/Gait Assistance Details: Patient greatly improved in quality of ambulation and distance ambulated compared to yesterdays treatment. Pt. required no rest breaks today during ambulation. Pt. required cueing to extend trunk and retract his shoulders, & safe body positioning inside RW.  Gait Pattern: Step-to pattern;Decreased stride length;Trunk flexed Gait velocity: decr Stairs: No    Exercises General Exercises - Lower Extremity Long Arc Quad: AROM;Both;10 reps;Seated Hip Flexion/Marching: AROM;Both;10 reps;Seated       PT Goals Acute Rehab PT Goals Time For Goal Achievement: 09/25/12 Potential to Achieve Goals: Good Pt will go Supine/Side to Sit: with supervision;with HOB 0 degrees PT Goal: Supine/Side to Sit - Progress: Progressing toward goal Pt will go Sit to Stand: with supervision;with upper extremity assist PT Goal: Sit to Stand - Progress: Progressing toward goal Pt will Ambulate: 51 - 150 feet;with supervision;with rolling walker PT Goal: Ambulate - Progress: Partly met  Visit Information  Last PT Received On: 09/14/12 Assistance Needed: +2    Subjective Data  Subjective: Pt received supine in bed agreeable to PT   Cognition  Cognition Arousal/Alertness: Awake/alert Behavior During Therapy: [facility] for tasks assessed/performed Overall Cognitive Status: Within Functional Limits for tasks assessed    Balance     End of Session PT - End of Session Equipment Utilized During Treatment: Gait belt Activity Tolerance: Patient tolerated treatment well Patient left: in chair;with call bell/phone within reach   GP     Jolyn Nap, SPTA 09/14/2012, 9:12 AM

## 2012-09-14 NOTE — Progress Notes (Signed)
ASSESSMENT/RECOMMENDATIONS  ESRD MWF [facility]  Got extra treatment 6/19 for pulm edema, HD MWF schedule , New right TDC fem + new AVG (6/17)  S/p VDRF S/p extra dialysis, thoracentesis 6/19 Chest tube for PTX 6/19  Elevated cardiac enzymes  NSTEMI due to hemodynamic stress in setting of chronically occluded RCA and ESRD but with stable LV function no additional workup needed  Anemia  Fever RLL infiltrate/effusion + decub  Encephalopathy Resolved and at baseline mental status  (multiple - hypoxemia, hyperecarbia, ?infection)  Plan: ? SNF per PT recs, Next HD Wed  Subjective: Interval History: Improved Objective: Vital signs in last 24 hours: Temp:  [97 F (36.1 C)-98.7 F (37.1 C)] 97.8 F (36.6 C) (06/24 0915) Pulse Rate:  [74-83] 76 (06/24 0915) Resp:  [11-20] 18 (06/24 0552) BP: (84-164)/(24-71) 164/71 mmHg (06/24 0915) SpO2:  [84 %-97 %] 84 % (06/24 0915) Weight:  [70.1 kg (154 lb 8.7 oz)-71.2 kg (156 lb 15.5 oz)] 71.2 kg (156 lb 15.5 oz) (06/24 0552) Weight change: -2.9 kg (-6 lb 6.3 oz)  Intake/Output from previous day: 06/23 0701 - 06/24 0700 In: 540 [P.O.:540] Out: 965 [Stool:1] Intake/Output this shift:    General appearance: alert and cooperative Resp: clear to auscultation bilaterally Chest wall: no tenderness, bandage at r post cw thoracentesisi site Extremities: edema tr  Lab Results:  Recent Labs  09/13/12 0505 09/14/12 0408  WBC 6.4 6.5  HGB 8.4* 8.0*  HCT 28.3* 26.4*  PLT 234 248   BMET:  Recent Labs  09/13/12 0505 09/14/12 0408  NA 135 137  K 4.0 3.4*  CL 95* 97  CO2 26 31  GLUCOSE 90 87  BUN 24* 12  CREATININE 6.38* 4.06*  CALCIUM 8.5 8.3*   No results found for this basename: PTH,  in the last 72 hours Iron Studies: No results found for this basename: IRON, TIBC, TRANSFERRIN, FERRITIN,  in the last 72 hours Studies/Results: Dg Chest Port 1 View  09/14/2012   *RADIOLOGY REPORT*  Clinical Data: Follow up respiratory failure   PORTABLE CHEST - 1 VIEW  Comparison: Prior chest x-ray 09/13/2012  Findings: Femoral approach tunneled hemodialysis catheter remains in unchanged position with the tip projecting over the right atrium and superior cavoatrial junction.  Stable cardiomegaly. Atherosclerotic, tortuous and ectatic thoracic aorta.  Stable patchy airspace disease and probable small effusion in the right base.  Mild pulmonary vascular congestion without overt edema. Inspiratory volumes are slightly improved on today's exam.  IMPRESSION:  Slightly improved inspiratory volumes, otherwise no significant interval change in the appearance of the chest.   Original Report Authenticated By: Malachy Moan, M.D.   Dg Chest Port 1 View  09/13/2012   *RADIOLOGY REPORT*  Clinical Data: Chest tube removal.  PORTABLE CHEST - 1 VIEW  Comparison: 09/13/2012.  Findings: The right-sided drainage catheter has been removed.  No pneumothorax.  Persistent airspace opacity in the right lower lobe. Stable tortuosity, ectasia and calcification of the thoracic aorta. The diatek catheter is stable.  IMPRESSION:  1.  Removal of right-sided drainage catheter without pneumothorax. 2.  Persistent right basilar opacity.   Original Report Authenticated By: Rudie Meyer, M.D.   Dg Chest Port 1 View  09/13/2012   *RADIOLOGY REPORT*  Clinical Data: Follow up chest tube  PORTABLE CHEST - 1 VIEW  Comparison: Prior chest x-ray 06/21 1014  Findings: Right pigtail thoracostomy tube remains in unchanged position.  There is a femoral approach split tip hemodialysis catheter with the tips projecting over  the right atrium.  There is a small loculated right hydropneumothorax.  Trace left pleural effusion.  Background pulmonary edema has improved.  There is residual pulmonary vascular congestion.  Atherosclerotic calcifications noted in the transverse aorta. Stable heart size.  IMPRESSION:  1.  Unchanged right basilar hydropneumothorax with a pigtail thoracostomy tube in place.  2.  Improving pulmonary edema with residual mild vascular congestion.   Original Report Authenticated By: Malachy Moan, M.D.   Scheduled: . amoxicillin-clavulanate  1 tablet Oral Daily  . antiseptic oral rinse  15 mL Mouth Rinse q12n4p  . aspirin  81 mg Oral Daily  . atorvastatin  80 mg Oral q1800  . calcium acetate  2,001 mg Oral TID WC  . cinacalcet  30 mg Oral Q breakfast  . [START ON 09/17/2012] darbepoetin (ARANESP) injection - DIALYSIS  100 mcg Intravenous Q Fri-HD  . doxercalciferol  3.5 mcg Intravenous Q M,W,F-HD  . DULoxetine  20 mg Oral Daily  . ferric gluconate (FERRLECIT/NULECIT) IV  125 mg Intravenous Q Mon-HD  . gabapentin  100 mg Oral QHS  . heparin subcutaneous  5,000 Units Subcutaneous Q8H  . hydrALAZINE  10 mg Oral Q8H  . isosorbide mononitrate  15 mg Oral Daily  . lanthanum  2,000 mg Oral TID WC  . pantoprazole  40 mg Oral Daily     LOS: 6 days   Sims Laday C 09/14/2012,11:10 AM

## 2012-09-14 NOTE — Progress Notes (Signed)
Pt agreed to stay one more day, plan is after HD to be dc, pt has home health aid from prior to admission, will continue to monitor

## 2012-09-14 NOTE — Progress Notes (Signed)
[name], [place] [phone] 09/14/2012

## 2012-09-14 NOTE — Plan of Care (Signed)
Problem: Phase I Progression Outcomes Goal: Voiding-avoid urinary catheter unless indicated Outcome: Not Progressing Pt is anuric and is on hemodialysis

## 2012-09-14 NOTE — Progress Notes (Signed)
PULMONARY  / CRITICAL CARE MEDICINE  Name: [name] MRN: [id number] DOB: [birth year]    ADMISSION DATE:  09/08/2012  REFERRING MD :  ER  CHIEF COMPLAINT: Change in mental status  BRIEF PATIENT DESCRIPTION:  62 yo male brought to ED by family with change in mental status.  Noted to have fever, and hyperkalemia.  Transferred from [facility] to [facility] for emergent HD. ABG was 7.33/49/68 on 3 liters.  UDS was negative.  Lactic acid was 1.9.  Troponin was 3.92.  ON 6/17 he had an access procedure with ligation of nonmaturing LUA BVT, placement new LUA AV graft and placement of new R femoral tunneled HD cath.     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  Denies chest pain.  C/o burning pain in hands and legs.  VITAL SIGNS: Temp:  [97 F (36.1 C)-98.7 F (37.1 C)] 97.8 F (36.6 C) (06/24 0915) Pulse Rate:  [74-83] 76 (06/24 0915) Resp:  [11-18] 18 (06/24 0552) BP: (84-164)/(27-71) 164/71 mmHg (06/24 0915) SpO2:  [84 %-96 %] 84 % (06/24 0915) Weight:  [70.1 kg (154 lb 8.7 oz)-71.2 kg (156 lb 15.5 oz)] 71.2 kg (156 lb 15.5 oz) (06/24 0552)   INTAKE / OUTPUT: Intake/Output     06/23 0701 - 06/24 0700 06/24 0701 - 06/25 0700   P.O. 540    Total Intake(mL/kg) 540 (7.6)    Other 964    Stool 1    Chest Tube     Total Output 965     Net -425            PHYSICAL EXAMINATION: General:wd male in nad, sitting in chair, wants to go home Neuro: Wide awake, follows commands, Moves all 4. HEENT: nose without purulence or d/c, no TMG or  LN Cardiovascular: regular, no murmur Lungs: mildly decreased bs, no wheezing, scattered crackles, Rt chest tube in place Abdomen:  Soft, + bowel sounds, non tender Musculoskeletal:  Lt and Rt TMA, no edema, AV graft Lt arm, HD cath Rt femoral region  LABS:  Recent Labs Lab 09/12/12 0420 09/13/12 0505 09/14/12 0408  HGB 7.9* 8.4* 8.0*  HCT 26.0* 28.3* 26.4*  WBC 5.0 6.4 6.5  PLT 174 234 248    Recent Labs Lab 09/08/12 1930  09/10/12 0425 09/11/12 0515 09/12/12 0420 09/13/12 0505 09/14/12 0408  NA 139  < > 135 131* 134* 135 137  K 5.7*  < > 3.4* 3.2* 3.3* 4.0 3.4*  CL 96  < > 93* 89* 95* 95* 97  CO2 21  < > 27 26 27 26 31   GLUCOSE 31*  < > 121* 113* 85 90 87  BUN 98*  < > 21 28* 16 24* 12  CREATININE 14.25*  < > 5.64* 7.65* 4.68* 6.38* 4.06*  CALCIUM 9.2  < > 9.3 8.8 8.4 8.5 8.3*  PHOS 9.8*  --  4.5 6.4* 4.5 4.8*  --   < > = values in this interval not displayed.  Recent Labs Lab 09/08/12 1930 09/09/12 1344  09/10/12 0425 09/11/12 0515 09/12/12 0420 09/13/12 0505  PROT  --  6.6  --   --   --   --   ALBUMIN 2.4*  --  2.1* 1.9* 1.9* 2.2*    Recent Labs Lab 09/09/12 0114 09/09/12 0505 09/09/12 1319 09/10/12 0338  PHART 7.374 7.323* 7.333* 7.537*  PCO2ART 52.8* 55.9* 54.2* 34.1*  PO2ART 370.0* 66.0* 308.0* 144.0*  HCO3 30.6* 29.0* 28.7* 28.6*  TCO2 32 31 30 30   O2SAT 100.0 91.0 100.0 99.0      Recent Labs Lab 09/12/12 1540 09/12/12 2145 09/13/12 0634 09/13/12 1138 09/13/12 1640  GLUCAP 105* 93 77 86 78    Imaging: Dg Chest Port 1 View  09/14/2012   *RADIOLOGY REPORT*  Clinical Data: Follow up respiratory failure  PORTABLE CHEST - 1 VIEW  Comparison: Prior chest x-ray 09/13/2012  Findings: Femoral approach tunneled hemodialysis catheter remains in unchanged position with the tip projecting over the right atrium and superior cavoatrial junction.  Stable cardiomegaly. Atherosclerotic, tortuous and ectatic thoracic aorta.  Stable patchy airspace  disease and probable small effusion in the right base.  Mild pulmonary vascular congestion without overt edema. Inspiratory volumes are slightly improved on today's exam.  IMPRESSION:  Slightly improved inspiratory volumes, otherwise no significant interval change in the appearance of the chest.   Original Report Authenticated By: Malachy Moan, M.D.   Dg Chest Port 1 View  09/13/2012   *RADIOLOGY REPORT*  Clinical Data: Chest tube removal.  PORTABLE CHEST - 1 VIEW  Comparison: 09/13/2012.  Findings: The right-sided drainage catheter has been removed.  No pneumothorax.  Persistent airspace opacity in the right lower lobe. Stable tortuosity, ectasia and calcification of the thoracic aorta. The diatek catheter is stable.  IMPRESSION:  1.  Removal of right-sided drainage catheter without pneumothorax. 2.  Persistent right basilar opacity.   Original Report Authenticated By: Rudie Meyer, M.D.   Dg Chest Port 1 View  09/13/2012   *RADIOLOGY REPORT*  Clinical Data: Follow up chest tube  PORTABLE CHEST - 1 VIEW  Comparison: Prior chest x-ray 06/21 1014  Findings: Right pigtail thoracostomy tube remains in unchanged position.  There is a femoral approach split tip hemodialysis catheter with the tips projecting over the right atrium.  There is a small loculated right hydropneumothorax.  Trace left pleural effusion.  Background pulmonary edema has improved.  There is residual pulmonary vascular congestion.  Atherosclerotic calcifications noted in the transverse aorta. Stable heart size.  IMPRESSION:  1.  Unchanged right basilar hydropneumothorax with a pigtail thoracostomy tube in place. 2.  Improving pulmonary edema with residual mild vascular congestion.   Original Report Authenticated By: Malachy Moan, M.D.     ASSESSMENT / PLAN:  PULMONARY A: Hypoxemia with progressive chronic Rt effusion and B infiltrates.  Likely also has some degree of hypoventilation. The pt has bilat pulmonary infiltrates of  unknown origin.  Cultures unhelpful so far.  He is continuing on broad spectrum abx for possible infection. Thoracentesis from 08/15/11 >> protein 3.2, LDH 155, WBC 214, reactive mesothelial cells.  Culture negative on fluid so far.  He is tolerating extubation well on low flow oxygen with no increased wob. P:   -DC chest tube  Dc 'd 6-23 -f/u CXR unremarkable  CARDIOVASCULAR A: Elevated cardiac enzymes >> uncertain significance in setting of renal failure. Hx of PVD, HTN, Diastolic heart failure. P:  -fluid management using HD -probable CAD, w/u to unfold once acute events resolve, appreciate cardiology's help   RENAL  Recent Labs Lab 09/12/12 0420 09/13/12 0505 09/14/12 0408  K 3.3* 4.0 3.4*     A:  ESRD. Hyperkalemia, resolved P:   -HD per renal -electrolyte replacement as indicated   INFECTIOUS A:  Fever, progressive Rt infiltrate/effusion, healed sacral decubitus ulcer. P:   -change to po augmentin  ENDOCRINE A: Hypoglycemia.  Suspect related to poor insulin clearance, renal status P:   -monitor blood sugars  NEUROLOGY A: Neuropathic pain. P: -Add cymbalta -resume neurontin  Transfer to telemetry.   6-24 he wants to go home. Plan for dc after HD in am [facility] Minor ACNP Adolph Pollack PCCM Pager [phone] till 3 pm If no answer page [phone]  Independently examined pt, evaluated data & formulated above care plan with NP  [facility] V. [phone] 09/14/2012, 12:40 PM

## 2012-09-14 NOTE — Progress Notes (Signed)
NUTRITION FOLLOW UP  Intervention:   1. No interventions at this time  Nutrition Dx:   Inadequate oral intake related to inability to eat as evidenced by NPO for poor mental status. Resolved  Goal:   Diet advance- Met  Monitor:   PO intake, weight trends  Assessment:   Pt continues on HD per regular schedule. Chest tube has been d/c'd. Possible D/c to SNF per notes.  Pt reports he is eating well, appetite is at baseline. Ready to go home.   Height: Ht Readings from Last 1 Encounters:  09/12/12 5\' 6"  (1.676 m)    Weight Status:   Wt Readings from Last 1 Encounters:  09/14/12 156 lb 15.5 oz (71.2 kg)    Re-estimated needs:  Kcal: 2100-2300  Protein: >/= 84 gm  Fluid: urin output + 1000 CC  Skin: healing sacral ulcer  Diet Order: Renal   Intake/Output Summary (Last 24 hours) at 09/14/12 1112 Last data filed at 09/14/12 0527  Gross per 24 hour  Intake    540 ml  Output    965 ml  Net   -425 ml    Last BM: 6/22   Labs:   Recent Labs Lab 09/11/12 0515 09/12/12 0420 09/13/12 0505 09/14/12 0408  NA 131* 134* 135 137  K 3.2* 3.3* 4.0 3.4*  CL 89* 95* 95* 97  CO2 26 27 26 31   BUN 28* 16 24* 12  CREATININE 7.65* 4.68* 6.38* 4.06*  CALCIUM 8.8 8.4 8.5 8.3*  PHOS 6.4* 4.5 4.8*  --   GLUCOSE 113* 85 90 87    CBG (last 3)   Recent Labs  09/13/12 0634 09/13/12 1138 09/13/12 1640  GLUCAP 77 86 78    Scheduled Meds: . amoxicillin-clavulanate  1 tablet Oral Daily  . antiseptic oral rinse  15 mL Mouth Rinse q12n4p  . aspirin  81 mg Oral Daily  . atorvastatin  80 mg Oral q1800  . calcium acetate  2,001 mg Oral TID WC  . cinacalcet  30 mg Oral Q breakfast  . [START ON 09/17/2012] darbepoetin (ARANESP) injection - DIALYSIS  100 mcg Intravenous Q Fri-HD  . doxercalciferol  3.5 mcg Intravenous Q M,W,F-HD  . DULoxetine  20 mg Oral Daily  . ferric gluconate (FERRLECIT/NULECIT) IV  125 mg Intravenous Q Mon-HD  . gabapentin  100 mg Oral QHS  . heparin  subcutaneous  5,000 Units Subcutaneous Q8H  . hydrALAZINE  10 mg Oral Q8H  . isosorbide mononitrate  15 mg Oral Daily  . lanthanum  2,000 mg Oral TID WC  . pantoprazole  40 mg Oral Daily     Clarene Duke RD, LDN Pager [phone] After Hours pager [phone]

## 2012-09-15 ENCOUNTER — Encounter: Payer: Self-pay | Admitting: Gastroenterology

## 2012-09-15 LAB — CULTURE, BLOOD (ROUTINE X 2): Culture: NO GROWTH

## 2012-09-15 MED ORDER — LANTHANUM CARBONATE 1000 MG PO CHEW: 2000.0000 mg | CHEWABLE_TABLET | Freq: Three times a day (TID) | ORAL | Status: DC

## 2012-09-15 MED ORDER — LIDOCAINE HCL (PF) 1 % IJ SOLN: 5.0000 mL | INTRAMUSCULAR | Status: DC | PRN

## 2012-09-15 MED ORDER — LIDOCAINE-PRILOCAINE 2.5-2.5 % EX CREA: 1.0000 "application " | TOPICAL_CREAM | CUTANEOUS | Status: DC | PRN

## 2012-09-15 MED ORDER — DOXERCALCIFEROL 4 MCG/2ML IV SOLN
INTRAVENOUS | Status: AC
  Administered 2012-09-15: 3.5 ug via INTRAVENOUS
  Filled 2012-09-15: qty 2

## 2012-09-15 MED ORDER — AMOXICILLIN-POT CLAVULANATE 500-125 MG PO TABS: 1.0000 | ORAL_TABLET | Freq: Every day | ORAL | Status: DC

## 2012-09-15 MED ORDER — NEPRO/CARBSTEADY PO LIQD
237.0000 mL | ORAL | Status: DC | PRN
  Filled 2012-09-15: qty 237

## 2012-09-15 MED ORDER — PENTAFLUOROPROP-TETRAFLUOROETH EX AERO: 1.0000 "application " | INHALATION_SPRAY | CUTANEOUS | Status: DC | PRN

## 2012-09-15 MED ORDER — OXYCODONE-ACETAMINOPHEN 5-325 MG PO TABS
ORAL_TABLET | ORAL | Status: AC
  Filled 2012-09-15: qty 1

## 2012-09-15 MED ORDER — ASPIRIN 81 MG PO CHEW: 81.0000 mg | CHEWABLE_TABLET | Freq: Every day | ORAL | Status: DC

## 2012-09-15 MED ORDER — SODIUM CHLORIDE 0.9 % IV SOLN: 100.0000 mL | INTRAVENOUS | Status: DC | PRN

## 2012-09-15 MED ORDER — HEPARIN SODIUM (PORCINE) 1000 UNIT/ML DIALYSIS
1000.0000 [IU] | INTRAMUSCULAR | Status: DC | PRN
  Filled 2012-09-15: qty 1

## 2012-09-15 MED ORDER — ALTEPLASE 2 MG IJ SOLR
2.0000 mg | Freq: Once | INTRAMUSCULAR | Status: DC | PRN
  Filled 2012-09-15: qty 2

## 2012-09-15 MED ORDER — HEPARIN SODIUM (PORCINE) 1000 UNIT/ML DIALYSIS
20.0000 [IU]/kg | INTRAMUSCULAR | Status: DC | PRN
  Filled 2012-09-15: qty 2

## 2012-09-15 MED ORDER — ISOSORBIDE MONONITRATE 15 MG HALF TABLET: 15.0000 mg | ORAL_TABLET | Freq: Every day | ORAL | Status: DC

## 2012-09-15 MED ORDER — DULOXETINE HCL 20 MG PO CPEP: 20.0000 mg | ORAL_CAPSULE | Freq: Every day | ORAL | Status: DC

## 2012-09-15 NOTE — Procedures (Signed)
Tolerating HD. Alert and appropriate.  Will try to reduce EDW at discharge. Prior EDW was 69.5Kg. BP generous. Will try to lower before discharge. Nyko Gell C

## 2012-09-15 NOTE — Clinical Social Work Psychosocial (Signed)
     Clinical Social Work Department BRIEF PSYCHOSOCIAL ASSESSMENT 09/15/2012  Patient:  [name], [name]     Account Number:  [id number]     Admit date:  09/08/2012  Clinical Social Worker:  Tiburcio Pea  Date/Time:  09/14/2012 02:00 PM  Referred by:  Physician  Date Referred:  09/13/2012 Referred for  SNF Placement   Other Referral:   Interview type:  Patient Other interview type:    PSYCHOSOCIAL DATA Living Status:  ALONE Admitted from facility:   Level of care:   Primary support name:   Primary support relationship to patient:  NEIGHBOR Degree of support available:   Strong support from neighbors and family.    CURRENT CONCERNS Current Concerns  None Noted   Other Concerns:    SOCIAL WORK ASSESSMENT / PLAN CSW referred to talk to patient about PT's recommendaton for short term SNF.  Patient recieves hemodialysis 3 times a week and he has been noted to have increased weakness. Patient relates that he will not agree to SNF placement and feels that he manages wel at home.  He has neighbors and family who help him as needed; taking him to the grocery store and places that he needs to go. He utilizes [employer] for transportation.  Patient has [facility] and he wants to continue this service. Feels he has all necessary DME.  CSW discussed benefits of short term SNF but he refuses.   Assessment/plan status:  No Further Intervention Required Other assessment/ plan:   Information/referral to community resources:   RNCM will arranged for continued Home health at d/c.  SNF list offered but deferred.    PATIENTS/FAMILYS RESPONSE TO PLAN OF CARE: Patient is alert and oriented; very pleasant gentleman who is currently living alone and feels he has been managing well at home. He is appreicative of PT's recommendation for short term SNF but he does not feel that he needs it.  CSW discussed wtih RNCM- Ronnie Derby; CSW signing off.  Lorri Frederick. Anira Senegal, LCSWA  [phone]

## 2012-09-15 NOTE — Progress Notes (Signed)
Pt d.c home. D/c instructions and medications reviewed with PT. Pt states understanding. All Pt questions answer.   Pt is to go home with Rt groin HD cath per Renal MD.

## 2012-09-15 NOTE — Progress Notes (Signed)
Rec PT from HD. Pt O4x

## 2012-09-15 NOTE — Progress Notes (Signed)
Pt in HD at being of shift.

## 2012-09-15 NOTE — Discharge Summary (Signed)
Physician Discharge Summary  Patient ID: [name] MRN: [id number] DOB/AGE: 1950-05-17 62 y.o.  Admit date: 09/08/2012 Discharge date: 09/15/2012    Discharge Diagnoses:  Principal Problem:   Acute encephalopathy Active Problems:   End stage renal disease   Pleural effusion   Pneumonia   Hyperkalemia   Elevated troponin   Acute respiratory failure with hypercapnia    Brief Summary: [name] is a 62 y.o. y/o male with a PMH of ESRD, stroke, HTN, CHF presented 6/18 to [facility] hospital with AMS, hyperkalemia, mildly elevated troponin.  CT head was neg for acute findings, CXR showed R basilar infiltrate and effusion.  Also noted to have sacral decubitus ulcer.  He was tx to [facility] for emergent dialysis.  He developed worsening hypoxia and resp failure, underwent R thoracentesis prior to intubation/positive pressure.  Developed R ptx post thoracentesis and had R pigtail chest tube placed.  Extubated after 24 hours, chest tube removed.  Now OOB in chair, on RA, ready for d/c.     STUDIES/Significant events:  6/18 CT head [facility]) >>progressive microvascular ischemic changes, old Rt parietal and Lt cerebellar infarcts  6/19 R thoracentesis >>>1047ml bloody pleural fluid 6/19 R chest tube for ptx post thora   LINES / TUBES:  Rt femoral HD cath >>  LEJ 6/18 >>  ETT 6/19 >> 6/20  R chest tube 6/19 >> 6-23   CULTURES:  Blood 6/18 >> neg Resp 6/19 >> oral flora  R pleural fluid 6/19 >>   ANTIBIOTICS:  Levaquin 6/18 >> 6/18  Vancomycin 6/18 >> 6/22  Zosyn 6/18 >> 6/22  Augmentin 6/22 >>                                                                    Hospital Summary by Discharge Diagnosis  Acute hypoxemic/ hypercarbic respiratory failure - r/t progressive chronic R effusion and Bilat infiltrates with underlying hypoventilation.  Intubated 6/19-6/20.  Had R thoracentesis with bloody fluid. Thoracentesis from 08/15/11 >> protein 3.2, LDH 155, WBC 214,  reactive mesothelial cells.  He was treated with abx for bilat infiltrates and will d/c on 5 more days augmentin.  Other abx as above.   Elevated cardiac enzymes Hx CHF Hx HTN   Followed by cardiology.  EF 50-55% with inferior hypokinesis and known subtotal occlusion of RCA by cath 5/13. ? Small NSTEMI in setting acute resp failure.  LV function stable and no further cardiac w/u indicated per Dr. Gala Romney.  He will f/u with PCP as outpt.   ESRD  Hyperkalemia  Renal followed closely.  Emergent HD initially in setting hyperkalemia.  Now tol reg HD schedule.  F/u with renal as outpt.   Hypoglycemia - Suspect related to poor insulin clearance, renal status.  Improved.   Neuropathic pain - cymbalta added, cont neurontin.   Filed Vitals:   09/15/12 0830 09/15/12 0900 09/15/12 0930 09/15/12 1000  BP: 150/57 150/55 150/45 131/45  Pulse: 79 78 68 84  Temp:      TempSrc:      Resp: 15 15 15 12   Height:      Weight:      SpO2:         Discharge Labs  BMET  Recent Labs  Lab 09/08/12 1930  09/10/12 0425 09/11/12 0515 09/12/12 0420 09/13/12 0505 09/14/12 0408  NA 139  < > 135 131* 134* 135 137  K 5.7*  < > 3.4* 3.2* 3.3* 4.0 3.4*  CL 96  < > 93* 89* 95* 95* 97  CO2 21  < > 27 26 27 26 31   GLUCOSE 31*  < > 121* 113* 85 90 87  BUN 98*  < > 21 28* 16 24* 12  CREATININE 14.25*  < > 5.64* 7.65* 4.68* 6.38* 4.06*  CALCIUM 9.2  < > 9.3 8.8 8.4 8.5 8.3*  PHOS 9.8*  --  4.5 6.4* 4.5 4.8*  --   < > = values in this interval not displayed.   CBC   Recent Labs Lab 09/12/12 0420 09/13/12 0505 09/14/12 0408  HGB 7.9* 8.4* 8.0*  HCT 26.0* 28.3* 26.4*  WBC 5.0 6.4 6.5  PLT 174 234 248   Anti-Coagulation No results found for this basename: INR,  in the last 168 hours     Future Appointments Provider Department Dept Phone   10/22/2012 2:30 PM [facility] Lab 3 [facility] [phone]   10/22/2012 3:45 PM Fransisco Hertz, MD [facility] [phone]   11/01/2012 10:00 AM [facility]Lab Lab 5 [facility] [phone]   11/01/2012 10:30 AM [facility]Lab Lab 5 [facility] [phone]   11/01/2012 11:30 AM [name], MD [facility] [phone]   11/02/2012 2:30 PM Salley Scarlet, MD [facility] [phone]          Follow-up Information   Follow up with [facility] A, MD.        Medication List    STOP taking these medications       ALEVE 220 MG tablet  Generic drug:  naproxen sodium     sevelamer carbonate 800 MG tablet  Commonly known as:  RENVELA      TAKE these medications       ALPRAZolam 1 MG tablet  Commonly known as:  XANAX  Take 1 tablet by mouth daily.     amoxicillin-clavulanate 500-125 MG per tablet  Commonly known as:  AUGMENTIN  Take 1 tablet (500 mg total) by mouth daily.     aspirin 81 MG chewable tablet  Chew 1 tablet (81 mg total) by mouth daily.     cinacalcet 60 MG tablet  Commonly known as:  SENSIPAR  Take 60 mg by mouth daily.     DULoxetine 20 MG capsule  Commonly known as:  CYMBALTA  Take 1 capsule (20 mg total) by mouth daily.     gabapentin 100 MG capsule  Commonly known as:  NEURONTIN  Take 100 mg by mouth daily.     hydrALAZINE 25 MG tablet  Commonly known as:  APRESOLINE  Take 25 mg by mouth 3 (three) times daily.     isosorbide mononitrate 15 mg Tb24  Commonly known as:  IMDUR  Take 0.5 tablets (15 mg total) by mouth daily.     lanthanum 1000 MG chewable tablet  Commonly known as:  FOSRENOL  Chew 2 tablets (2,000 mg total) by mouth 3 (three) times daily with meals.     multivitamin Tabs tablet  Take 1 tablet by mouth daily.     oxyCODONE-acetaminophen 5-325 MG per tablet  Commonly known as:  PERCOCET/ROXICET  Take 1 tablet by mouth every 6 (six) hours as needed for  pain.     PHOSLO 667 MG capsule  Generic drug:  calcium acetate  Take  1,334-2,001 mg by mouth 3 (three) times daily with meals.     simvastatin 5 MG tablet  Commonly known as:  ZOCOR  Take 5 mg by mouth at bedtime.     traMADol 50 MG tablet  Commonly known as:  ULTRAM  Take 50 mg by mouth every 6 (six) hours as needed for pain. For pain         Disposition: 01-Home or Self Care  Discharged Condition: [name] has met maximum benefit of inpatient care and is medically stable and cleared for discharge.  Patient is pending follow up as above.      Time spent on disposition:  Greater than 35 minutes.   SignedDanford Bad, NP 09/15/2012  10:12 AM Pager: [phone] or [phone]  Independently examined pt, evaluated data & formulated above discharge care plan with NP  [facility] V.

## 2012-09-17 ENCOUNTER — Encounter: Payer: Self-pay | Admitting: Gastroenterology

## 2012-10-06 ENCOUNTER — Other Ambulatory Visit: Payer: Self-pay | Admitting: *Deleted

## 2012-10-06 DIAGNOSIS — Z48812 Encounter for surgical aftercare following surgery on the circulatory system: Secondary | ICD-10-CM

## 2012-10-06 DIAGNOSIS — N186 End stage renal disease: Secondary | ICD-10-CM

## 2012-10-07 ENCOUNTER — Encounter: Payer: Medicare Other | Admitting: *Deleted

## 2012-10-07 ENCOUNTER — Telehealth: Payer: Self-pay | Admitting: *Deleted

## 2012-10-07 NOTE — Telephone Encounter (Signed)
Pt had previsit appointment scheduled 10/07/12 at 230 pm. No show. Attempted to call patient twice with no answer and no message machine. Cancel appointment for previst and EGD scheduled 10/21/12. Will send letter stating that he needs to call to reschedule both appointments.

## 2012-10-11 ENCOUNTER — Telehealth: Payer: Self-pay

## 2012-10-11 NOTE — Telephone Encounter (Signed)
Pt. called to request refill on pain medication.  Complains "my left arm hurts where I had my last surgery."  Questioned about symptoms.  States has pain, redness, and swelling.  Denies fever or drainage.  Stated he just finished antibiotics at dialysis.  Also c/o bilateral leg pain.  States pain is legs is worse with going up 1-2 steps, and has to stop due to the pain.  States his right knee has had a darkened color, since is surgery last October.  Denies any change in temperature of legs.  Advised pt. Has 6 mo f/u appt. W/ Dr. Myra Gianotti 8/11 for evaluation of PVD.  Will schedule appt. for evaluation of left arm AVG.  Informed unable to refill pain medication for post-op pain at this time, without further evaluation.  Verb. Understanding.

## 2012-10-15 DIAGNOSIS — I959 Hypotension, unspecified: Secondary | ICD-10-CM

## 2012-10-21 ENCOUNTER — Encounter: Payer: Self-pay | Admitting: Vascular Surgery

## 2012-10-21 ENCOUNTER — Encounter: Payer: Medicare Other | Admitting: Gastroenterology

## 2012-10-22 ENCOUNTER — Ambulatory Visit: Payer: Medicare Other | Admitting: Vascular Surgery

## 2012-10-28 ENCOUNTER — Encounter: Payer: Self-pay | Admitting: General Practice

## 2012-10-28 ENCOUNTER — Ambulatory Visit (INDEPENDENT_AMBULATORY_CARE_PROVIDER_SITE_OTHER): Payer: Medicare Other | Admitting: General Practice

## 2012-10-28 VITALS — BP 131/59 | HR 84 | Temp 98.5°F | Ht 66.0 in | Wt 151.0 lb

## 2012-10-28 DIAGNOSIS — G4700 Insomnia, unspecified: Secondary | ICD-10-CM

## 2012-10-28 DIAGNOSIS — I10 Essential (primary) hypertension: Secondary | ICD-10-CM

## 2012-10-28 DIAGNOSIS — G8929 Other chronic pain: Secondary | ICD-10-CM

## 2012-10-28 MED ORDER — ALPRAZOLAM 1 MG PO TABS: 1.0000 mg | ORAL_TABLET | Freq: Every day | ORAL | Status: DC

## 2012-10-28 NOTE — Progress Notes (Signed)
  Subjective:    Patient ID: [name], male    DOB: 07-Feb-1951, 62 y.o.   MRN: [id number]  HPI Patient presents today to establish care. He reports his primary care physician was in [place], but seeking someone closer. He reports receiving dialysis on Monday, Wednesday, and Friday. He has a graft to left upper arm that is functioning. He reports being seen at a pain management clinic in [place], where he is receiving percocet for shoulder and back pain. He was also seen by ortho in [place], where he received an injection. He reports inability to sleep and xanax 1 mg is effective in helping him sleep. Reports hypertension being managed by nephrologist office.     Review of Systems  Constitutional: Negative for fever and chills.  HENT: Negative for ear pain, neck pain and neck stiffness.   Respiratory: Negative for chest tightness and shortness of breath.   Cardiovascular: Negative for chest pain and palpitations.  Gastrointestinal: Negative for nausea, vomiting, abdominal pain and blood in stool.  Genitourinary:       Anuric  Musculoskeletal: Positive for joint swelling.       Left shoulder pain   Neurological: Negative for dizziness, weakness and headaches.       Objective:   Physical Exam  Constitutional: He is oriented to person, place, and time. He appears well-developed and well-nourished.  HENT:  Head: Normocephalic and atraumatic.  Right Ear: External ear normal.  Left Ear: External ear normal.  Mouth/Throat: Oropharynx is clear and moist.  Eyes: Conjunctivae and EOM are normal.  Cataracts to bilaterally  Neck: No thyromegaly present.  Cardiovascular: Normal rate, regular rhythm and normal heart sounds.   Pulmonary/Chest: Effort normal and breath sounds normal.  Musculoskeletal: He exhibits no edema and no tenderness.  Left foot transmetatarsal amputation Limited range of motion to left shoulder  Lymphadenopathy:    He has no cervical adenopathy.  Neurological: He is  alert and oriented to person, place, and time.  Skin: Skin is warm and dry.  Psychiatric: He has a normal mood and affect.          Assessment & Plan:  1. Insomnia - ALPRAZolam (XANAX) 1 MG tablet; Take 1 tablet (1 mg total) by mouth daily.  Dispense: 30 tablet; Refill: 0  2. Chronic shoulder pain, left  3. Hypertension Continue all current medications Labs pending (Have copy faxed from dialysis center or renal physicians) F/u in 3 months Discussed healthy eating habits and adhering to renal diet (provided by nephrologist  Patient verbalized understanding Coralie Keens, FNP-C

## 2012-10-28 NOTE — Patient Instructions (Addendum)

## 2012-10-29 ENCOUNTER — Encounter: Payer: Self-pay | Admitting: Surgery

## 2012-11-01 ENCOUNTER — Encounter (INDEPENDENT_AMBULATORY_CARE_PROVIDER_SITE_OTHER): Payer: Medicare Other | Admitting: *Deleted

## 2012-11-01 ENCOUNTER — Encounter: Payer: Self-pay | Admitting: Surgery

## 2012-11-01 ENCOUNTER — Ambulatory Visit (INDEPENDENT_AMBULATORY_CARE_PROVIDER_SITE_OTHER): Payer: Medicare Other | Admitting: Surgery

## 2012-11-01 VITALS — BP 173/74 | HR 64 | Resp 16 | Ht 66.0 in | Wt 150.0 lb

## 2012-11-01 DIAGNOSIS — I739 Peripheral vascular disease, unspecified: Secondary | ICD-10-CM

## 2012-11-01 DIAGNOSIS — Z48812 Encounter for surgical aftercare following surgery on the circulatory system: Secondary | ICD-10-CM

## 2012-11-01 NOTE — Progress Notes (Signed)
[facility] [place]   Patient name: [name] MRN: [id number] DOB: 07/15/1950 Sex: male     Chief Complaint  Patient presents with  . PVD    6 Mo f/up with vascular lab study.    HISTORY OF PRESENT ILLNESS: The patient is back today for followup. He is status post right femoral to above-knee popliteal artery bypass graft with great toe amputation on 01/02/2012. He is also status post left femoral to below knee popliteal bypass graft on 08/15/2011 and left transmetatarsal amputation on 09/12/2011. He is back today for followup.  He has no complaints today. He continues to get dialysis via a catheter although he had a left upper arm graft placed in June  Past Medical History  Diagnosis Date  . Hyperlipidemia   . ESRD (end stage renal disease)     a. MWF dialysis in [place] (followed by Dr. Kristian Covey)  . Arthritis   . Claudication   . Stroke 2005  . Anemia   . Chronic diastolic CHF (congestive heart failure) 07/2010    a. 07/2008 Echo EF 50-55%, mild-mod LVH, Gr 1 DD.  Marland Kitchen Peripheral vascular occlusive disease     a. 07/2011 Periph Angio: No signif Ao-illiac dzs, LCF stenosis, 100% LSFA w recon above knee pop and 3 vessel runoff, 100% RSFA w/ recon above knee --> pending L Fem to below Knee pop bypass.  Marland Kitchen History of tobacco abuse   . Hypertension     takes Amlodipine,Metoprolol,and Prinivil daily  . Dyspnea on exertion     with exertion  . GERD (gastroesophageal reflux disease)   . Cataract     bilateral  . Dialysis patient   . PUD (peptic ulcer disease) mid 1990's    with GI bleed.  endoscoped in mid 1990's at [facility]    Past Surgical History  Procedure Laterality Date  . Arteriovenous graft placement    . Av fistula placement  12/10/2000    Right brachiocephalic arteriovenous   . Hernia repair    . Aortagram  07/29/2011    Abdominal Aortagram  . Cardiac catheterization  08/01/11    Left heart catheterization  . Femoral-popliteal bypass  graft  09/09/2011    Procedure: BYPASS GRAFT FEMORAL-POPLITEAL ARTERY;  Surgeon: [name], MD;  Location: [facility] OR;  Service: Vascular;  Laterality: Left;  . Amputation  09/12/2011    Procedure: AMPUTATION BELOW KNEE;  Surgeon: [name], MD;  Location: [facility] OR;  Service: Vascular;  Laterality: Left;  TRANSMETATARSAL  . Application of wound vac  10/30/2011    Procedure: APPLICATION OF WOUND VAC;  Surgeon: [name], MD;  Location: [facility] OR;  Service: Vascular;  Laterality: Left;  . Groin debridement  11/01/2011    Procedure: Drucie Ip DEBRIDEMENT;  Surgeon: Chuck Hint, MD;  Location: [facility] OR;  Service: Vascular;  Laterality: Left;  . Esophagogastroduodenoscopy  11/06/2011    Procedure: ESOPHAGOGASTRODUODENOSCOPY (EGD);  Surgeon: Beverley Fiedler, MD;  Location: [facility] ENDOSCOPY;  Service: Gastroenterology;  Laterality: N/A;  . Femoral-popliteal bypass graft  01/02/2012    Procedure: BYPASS GRAFT FEMORAL-POPLITEAL ARTERY;  Surgeon: [name], MD;  Location: [facility] OR;  Service: Vascular;  Laterality: Right;  . Amputation  01/02/2012    Procedure: AMPUTATION RAY;  Surgeon: [name], MD;  Location: [facility] OR;  Service: Vascular;  Laterality: Right;  . Toe amputation Right     5/13  . Exchange of a dialysis catheter N/A 07/15/2012  Procedure: EXCHANGE OF A DIALYSIS CATHETER;  Surgeon: Fransisco Hertz, MD;  Location: [facility] OR;  Service: Vascular;  Laterality: N/A;  . Av fistula placement Left 07/15/2012    Procedure: ARTERIOVENOUS (AV) FISTULA CREATION;  Surgeon: Fransisco Hertz, MD;  Location: [facility] OR;  Service: Vascular;  Laterality: Left;  . Av fistula placement Left 09/07/2012    Procedure: INSERTION OF ARTERIOVENOUS (AV) GORE-TEX GRAFT ARM;  Surgeon: Fransisco Hertz, MD;  Location: [facility] OR;  Service: Vascular;  Laterality: Left;  . Insertion of dialysis catheter Right 09/07/2012    Procedure: INSERTION OF a Femoral DIALYSIS CATHETER;  Surgeon: Fransisco Hertz, MD;  Location: [facility] OR;  Service: Vascular;   Laterality: Right;  . Lipoma excision Left 09/07/2012    Procedure: EXCISION Seroma left arm;  Surgeon: Fransisco Hertz, MD;  Location: [facility] OR;  Service: Vascular;  Laterality: Left;    History   Social History  . Marital Status: Legally Separated    Spouse Name: N/A    Number of Children: N/A  . Years of Education: N/A   Occupational History  . DISABLED    Social History Main Topics  . Smoking status: Current Every Day Smoker -- 0.30 packs/day for 40 years    Types: Cigarettes    Last Attempt to Quit: 06/13/2012  . Smokeless tobacco: Never Used     Comment: pt was given 1-800-QUIT-NOW  . Alcohol Use: No  . Drug Use: No  . Sexually Active: Not Currently   Other Topics Concern  . Not on file   Social History Narrative   Lives in [place].  His cousin helps him out and drives him to appts. He quit smoking earlier this year.    Family History  Problem Relation Age of Onset  . Breast cancer Mother   . Cancer Mother   . Cancer Father   . Anesthesia problems Neg Hx   . Hypotension Neg Hx   . Malignant hyperthermia Neg Hx   . Pseudochol deficiency Neg Hx     Allergies as of 11/01/2012 - Review Complete 11/01/2012  Allergen Reaction Noted  . Ambien (zolpidem tartrate) Other (See Comments) 01/26/2012    Current Outpatient Prescriptions on File Prior to Visit  Medication Sig Dispense Refill  . ALPRAZolam (XANAX) 1 MG tablet Take 1 tablet (1 mg total) by mouth daily.  30 tablet  0  . calcium acetate (PHOSLO) 667 MG capsule Take 1,334-2,001 mg by mouth 3 (three) times daily with meals.       . cinacalcet (SENSIPAR) 60 MG tablet Take 60 mg by mouth daily.      . DULoxetine (CYMBALTA) 20 MG capsule Take 60 mg by mouth daily.      Marland Kitchen gabapentin (NEURONTIN) 100 MG capsule Take 100 mg by mouth daily.      . hydrALAZINE (APRESOLINE) 25 MG tablet Take 25 mg by mouth 3 (three) times daily.      . isosorbide mononitrate (IMDUR) 15 mg TB24 Take 0.5 tablets (15 mg total) by mouth daily.   15 tablet  1  . lanthanum (FOSRENOL) 1000 MG chewable tablet Chew 2 tablets (2,000 mg total) by mouth 3 (three) times daily with meals.  60 tablet  0  . multivitamin (RENA-VIT) TABS tablet Take 1 tablet by mouth daily.      Marland Kitchen oxyCODONE-acetaminophen (PERCOCET/ROXICET) 5-325 MG per tablet Take 1 tablet by mouth every 6 (six) hours as needed for pain.  30 tablet  0  . simvastatin (ZOCOR)  5 MG tablet Take 5 mg by mouth at bedtime.      . traMADol (ULTRAM) 50 MG tablet Take 50 mg by mouth every 6 (six) hours as needed for pain. For pain       No current facility-administered medications on file prior to visit.     REVIEW OF SYSTEMS: Per the patient, no changes from prior visit  PHYSICAL EXAMINATION:   Vital signs are BP 173/74  Pulse 64  Resp 16  Ht 5\' 6"  (1.676 m)  Wt 150 lb (68.04 kg)  BMI 24.22 kg/m2  SpO2 92% General: The patient appears their stated age. HEENT:  No gross abnormalities Pulmonary:  Non labored breathing Musculoskeletal: There are no major deformities. Neurologic: No focal weakness or paresthesias are detected, Skin: There are no ulcer or rashes noted. Psychiatric: The patient has normal affect. Cardiovascular: There is a regular rate and rhythm without significant murmur appreciated. Pedal pulses are nonpalpable. Good thrill in left upper arm dialysis graft   Diagnostic Studies Ultrasound was performed today of his lower extremities. Bypass grafts are widely patent with biphasic waveforms.  Assessment: Status post bilateral femoral-popliteal bypass graft Plan: The patient will continue with serial surveillance ultrasound imaging, the next 30 would be in 6 months.  I told the patient start using his left upper arm dialysis graft  V. Charlena Cross, M.D. [facility] [place] Office: [phone] Pager:  [phone]   VASCULAR QUALITY INITIATIVE FOLLOW UP DATA:  Current smoker: [  ] yes  [  ] no  Living status: [x  ]  Home  [   ] Nursing home  [  ] Homeless    MEDS:  ASA [ x ] yes  [  ] no- [  ] medical reason  [  ] non compliant  STATIN  [x  ] yes  [  ] no- [  ] medical reason  [  ] non compliant  Beta blocker [  ] yes  [  ] no- [  ] medical reason  [  ] non compliant  ACE inhibitor [  ] yes  [  ] no- [ x ] medical reason  [  ] non compliant  P2Y12 Antagonist [  x] none  [  ] clopidogrel-Plavix  [  ] ticlopidine-Ticlid   [  ] prasugrel-Effient  [  ] ticagrelor- Brilinta    Anticoagulant [  ] xNone  [  ] warfarin  [  ] rivaroxaban-Xarelto [  ] dabigatran- Pradaxa  Ambulation: [ x ] Amb  [  ] Amb with assistance  [  ] wheelchair  [  ] bedridden  Ipsilateral Sx: [ x ] none   [  ] claudication  [  ] rest pain  [  ] tissue loss  Current Patency: [  x] primary  [  ] primary-assisted  [  ] secondary  [  ] occluded  Patency judged by: [  ] doppler  [  ] palp graft pulse  [  ] palp distal pulse   [  ] ABI increase > 15%   [x  ] Duplex  If occluded, when-   Ipsilateral HQI:[id number]  Ipsilateral TBI: no toes  Infection: [ x ] none  [  ] cellulitis  [  ] deep abscess  [  ] infection of artery or graft  Bypass revision: [ x ] no  [  ] yes- [  ] surg  [  ] catheter  based  [  ] both    Date:   Thrombectomy/ lysis/ revision:  [  x] no  [  ] yes- [  ] surg  [  ] catheter based  [  ] both    Date:   Major amputation: [x  ] no  [  ] minor amp  [  ] BKA  [  ] AKA   Date:

## 2012-11-02 ENCOUNTER — Ambulatory Visit: Payer: Self-pay | Admitting: Family Medicine

## 2012-11-03 ENCOUNTER — Telehealth: Payer: Self-pay | Admitting: *Deleted

## 2012-11-03 NOTE — Telephone Encounter (Signed)
Per Dr. Estanislado Spire note from 11-01-12, [name]'s left upper arm AVG may now be used. Patient called in to triage today and will contact Davida in [place] himself regarding this order.

## 2012-11-12 ENCOUNTER — Other Ambulatory Visit (HOSPITAL_COMMUNITY): Payer: Self-pay | Admitting: Nephrology

## 2012-11-12 DIAGNOSIS — N186 End stage renal disease: Secondary | ICD-10-CM

## 2012-11-15 ENCOUNTER — Other Ambulatory Visit: Payer: Self-pay | Admitting: *Deleted

## 2012-11-15 DIAGNOSIS — I739 Peripheral vascular disease, unspecified: Secondary | ICD-10-CM

## 2012-11-15 DIAGNOSIS — Z48812 Encounter for surgical aftercare following surgery on the circulatory system: Secondary | ICD-10-CM

## 2012-11-18 ENCOUNTER — Ambulatory Visit (HOSPITAL_COMMUNITY)
Admission: RE | Admit: 2012-11-18 | Discharge: 2012-11-18 | Disposition: A | Discharge status: Home / Self Care | Payer: Medicare Other | Source: Ambulatory Visit | Attending: Nephrology | Admitting: Nephrology

## 2012-11-18 DIAGNOSIS — Z452 Encounter for adjustment and management of vascular access device: Secondary | ICD-10-CM | POA: Insufficient documentation

## 2012-11-18 DIAGNOSIS — Z992 Dependence on renal dialysis: Secondary | ICD-10-CM | POA: Insufficient documentation

## 2012-11-18 DIAGNOSIS — N186 End stage renal disease: Secondary | ICD-10-CM | POA: Insufficient documentation

## 2012-11-18 MED ORDER — CHLORHEXIDINE GLUCONATE 4 % EX LIQD
CUTANEOUS | Status: AC
  Filled 2012-11-18: qty 30

## 2012-11-25 ENCOUNTER — Other Ambulatory Visit: Payer: Self-pay

## 2012-11-25 ENCOUNTER — Encounter: Payer: Medicare Other | Admitting: Gastroenterology

## 2012-11-25 ENCOUNTER — Other Ambulatory Visit: Payer: Self-pay | Admitting: General Practice

## 2012-11-25 DIAGNOSIS — G4700 Insomnia, unspecified: Secondary | ICD-10-CM

## 2012-11-25 MED ORDER — ALPRAZOLAM 1 MG PO TABS: 1.0000 mg | ORAL_TABLET | Freq: Every day | ORAL | Status: DC

## 2012-11-25 NOTE — Telephone Encounter (Signed)
Last seen 10/28/12  Mae   If approved route to nurse to call in

## 2012-11-26 ENCOUNTER — Telehealth: Payer: Self-pay | Admitting: *Deleted

## 2012-11-26 NOTE — Telephone Encounter (Signed)
Script for xanax with one refill [facility].  Pt aware.

## 2012-11-27 ENCOUNTER — Other Ambulatory Visit: Payer: Self-pay | Admitting: General Practice

## 2012-11-29 ENCOUNTER — Encounter (HOSPITAL_COMMUNITY): Payer: Self-pay | Admitting: *Deleted

## 2012-11-29 ENCOUNTER — Telehealth: Payer: Self-pay | Admitting: *Deleted

## 2012-11-29 ENCOUNTER — Encounter (HOSPITAL_COMMUNITY): Payer: Self-pay | Admitting: Pharmacy Technician

## 2012-11-29 ENCOUNTER — Other Ambulatory Visit: Payer: Self-pay | Admitting: *Deleted

## 2012-11-29 MED ORDER — DEXTROSE 5 % IV SOLN
1.5000 g | INTRAVENOUS | Status: AC
  Administered 2012-11-30: 1.5 g via INTRAVENOUS
  Filled 2012-11-29: qty 1.5

## 2012-11-29 NOTE — Telephone Encounter (Signed)
[name] stated [name] needed his AVG declotted. He has not dialyzed since Friday. I scheduled him for 11/30/12 at 7:30AM with Dr Edilia Bo.

## 2012-11-30 ENCOUNTER — Ambulatory Visit (HOSPITAL_COMMUNITY)
Admission: RE | Admit: 2012-11-30 | Discharge: 2012-11-30 | Disposition: A | Discharge status: Home / Self Care | Payer: Medicare Other | Source: Ambulatory Visit | Attending: Vascular Surgery | Admitting: Vascular Surgery

## 2012-11-30 ENCOUNTER — Encounter (HOSPITAL_COMMUNITY)
Admission: RE | Disposition: A | Discharge status: Home / Self Care | Payer: Self-pay | Source: Ambulatory Visit | Attending: Vascular Surgery

## 2012-11-30 ENCOUNTER — Ambulatory Visit (HOSPITAL_COMMUNITY): Payer: Medicare Other

## 2012-11-30 ENCOUNTER — Ambulatory Visit (HOSPITAL_COMMUNITY): Payer: Medicare Other | Admitting: Anesthesiology

## 2012-11-30 ENCOUNTER — Encounter (HOSPITAL_COMMUNITY): Payer: Self-pay | Admitting: *Deleted

## 2012-11-30 ENCOUNTER — Encounter (HOSPITAL_COMMUNITY): Payer: Self-pay | Admitting: Anesthesiology

## 2012-11-30 DIAGNOSIS — I82609 Acute embolism and thrombosis of unspecified veins of unspecified upper extremity: Secondary | ICD-10-CM | POA: Insufficient documentation

## 2012-11-30 DIAGNOSIS — D649 Anemia, unspecified: Secondary | ICD-10-CM | POA: Insufficient documentation

## 2012-11-30 DIAGNOSIS — Z79899 Other long term (current) drug therapy: Secondary | ICD-10-CM | POA: Insufficient documentation

## 2012-11-30 DIAGNOSIS — I739 Peripheral vascular disease, unspecified: Secondary | ICD-10-CM | POA: Insufficient documentation

## 2012-11-30 DIAGNOSIS — E785 Hyperlipidemia, unspecified: Secondary | ICD-10-CM | POA: Insufficient documentation

## 2012-11-30 DIAGNOSIS — I509 Heart failure, unspecified: Secondary | ICD-10-CM | POA: Insufficient documentation

## 2012-11-30 DIAGNOSIS — Z992 Dependence on renal dialysis: Secondary | ICD-10-CM | POA: Insufficient documentation

## 2012-11-30 DIAGNOSIS — T82898A Other specified complication of vascular prosthetic devices, implants and grafts, initial encounter: Secondary | ICD-10-CM | POA: Insufficient documentation

## 2012-11-30 DIAGNOSIS — F17200 Nicotine dependence, unspecified, uncomplicated: Secondary | ICD-10-CM | POA: Insufficient documentation

## 2012-11-30 DIAGNOSIS — N186 End stage renal disease: Secondary | ICD-10-CM

## 2012-11-30 DIAGNOSIS — Y832 Surgical operation with anastomosis, bypass or graft as the cause of abnormal reaction of the patient, or of later complication, without mention of misadventure at the time of the procedure: Secondary | ICD-10-CM | POA: Insufficient documentation

## 2012-11-30 DIAGNOSIS — Y929 Unspecified place or not applicable: Secondary | ICD-10-CM | POA: Insufficient documentation

## 2012-11-30 DIAGNOSIS — S98119A Complete traumatic amputation of unspecified great toe, initial encounter: Secondary | ICD-10-CM | POA: Insufficient documentation

## 2012-11-30 DIAGNOSIS — M129 Arthropathy, unspecified: Secondary | ICD-10-CM | POA: Insufficient documentation

## 2012-11-30 DIAGNOSIS — I120 Hypertensive chronic kidney disease with stage 5 chronic kidney disease or end stage renal disease: Secondary | ICD-10-CM | POA: Insufficient documentation

## 2012-11-30 DIAGNOSIS — Z888 Allergy status to other drugs, medicaments and biological substances status: Secondary | ICD-10-CM | POA: Insufficient documentation

## 2012-11-30 DIAGNOSIS — I5032 Chronic diastolic (congestive) heart failure: Secondary | ICD-10-CM | POA: Insufficient documentation

## 2012-11-30 HISTORY — DX: Personal history of other medical treatment: Z92.89

## 2012-11-30 HISTORY — PX: THROMBECTOMY AND REVISION OF ARTERIOVENTOUS (AV) GORETEX  GRAFT: SHX6120

## 2012-11-30 HISTORY — DX: Unspecified asthma, uncomplicated: J45.909

## 2012-11-30 LAB — POCT I-STAT 4, (NA,K, GLUC, HGB,HCT)
Glucose, Bld: 58 mg/dL — ABNORMAL LOW (ref 70–99)
HCT: 39 % (ref 39.0–52.0)
Potassium: 3.9 mEq/L (ref 3.5–5.1)

## 2012-11-30 LAB — GLUCOSE, CAPILLARY: Glucose-Capillary: 64 mg/dL — ABNORMAL LOW (ref 70–99)

## 2012-11-30 SURGERY — THROMBECTOMY AND REVISION OF ARTERIOVENTOUS (AV) GORETEX  GRAFT
Anesthesia: Monitor Anesthesia Care | Site: Arm Upper | Laterality: Left | Wound class: Clean

## 2012-11-30 MED ORDER — 0.9 % SODIUM CHLORIDE (POUR BTL) OPTIME
TOPICAL | Status: DC | PRN
  Administered 2012-11-30: 1000 mL

## 2012-11-30 MED ORDER — SODIUM CHLORIDE 0.9 % IV SOLN
INTRAVENOUS | Status: DC | PRN
  Administered 2012-11-30: 07:00:00 via INTRAVENOUS

## 2012-11-30 MED ORDER — HYDROMORPHONE HCL PF 1 MG/ML IJ SOLN: 0.2500 mg | INTRAMUSCULAR | Status: DC | PRN

## 2012-11-30 MED ORDER — SODIUM CHLORIDE 0.9 % IR SOLN
Status: DC | PRN
  Administered 2012-11-30: 08:00:00

## 2012-11-30 MED ORDER — LIDOCAINE-EPINEPHRINE (PF) 1 %-1:200000 IJ SOLN
INTRAMUSCULAR | Status: DC | PRN
  Administered 2012-11-30: 30 mL

## 2012-11-30 MED ORDER — ONDANSETRON HCL 4 MG/2ML IJ SOLN: 4.0000 mg | Freq: Once | INTRAMUSCULAR | Status: DC | PRN

## 2012-11-30 MED ORDER — PROPOFOL INFUSION 10 MG/ML OPTIME
INTRAVENOUS | Status: DC | PRN
  Administered 2012-11-30: 25 ug/kg/min via INTRAVENOUS

## 2012-11-30 MED ORDER — MIDAZOLAM HCL 2 MG/2ML IJ SOLN
1.0000 mg | INTRAMUSCULAR | Status: DC | PRN
  Administered 2012-11-30: 2 mg via INTRAVENOUS

## 2012-11-30 MED ORDER — PROTAMINE SULFATE 10 MG/ML IV SOLN
INTRAVENOUS | Status: DC | PRN
  Administered 2012-11-30: 30 mg via INTRAVENOUS

## 2012-11-30 MED ORDER — SODIUM CHLORIDE 0.9 % IV SOLN: INTRAVENOUS | Status: DC

## 2012-11-30 MED ORDER — OXYCODONE-ACETAMINOPHEN 5-325 MG PO TABS
ORAL_TABLET | ORAL | Status: AC
  Filled 2012-11-30: qty 2

## 2012-11-30 MED ORDER — FENTANYL CITRATE 0.05 MG/ML IJ SOLN
50.0000 ug | Freq: Once | INTRAMUSCULAR | Status: AC
  Administered 2012-11-30: 50 ug via INTRAVENOUS
  Administered 2012-11-30: 25 ug via INTRAVENOUS
  Administered 2012-11-30 (×2): 50 ug via INTRAVENOUS

## 2012-11-30 MED ORDER — HEPARIN SODIUM (PORCINE) 1000 UNIT/ML IJ SOLN
INTRAMUSCULAR | Status: DC | PRN
  Administered 2012-11-30: 6000 [IU] via INTRAVENOUS

## 2012-11-30 MED ORDER — OXYCODONE-ACETAMINOPHEN 7.5-325 MG PO TABS: 1.0000 | ORAL_TABLET | Freq: Three times a day (TID) | ORAL | Status: DC | PRN

## 2012-11-30 MED ORDER — OXYCODONE-ACETAMINOPHEN 5-325 MG PO TABS
1.5000 | ORAL_TABLET | ORAL | Status: DC | PRN
  Administered 2012-11-30: 1.5 via ORAL

## 2012-11-30 MED ORDER — LIDOCAINE HCL (CARDIAC) 20 MG/ML IV SOLN
INTRAVENOUS | Status: DC | PRN
  Administered 2012-11-30: 60 mg via INTRAVENOUS

## 2012-11-30 MED ORDER — LIDOCAINE-EPINEPHRINE (PF) 1 %-1:200000 IJ SOLN
INTRAMUSCULAR | Status: AC
  Filled 2012-11-30: qty 10

## 2012-11-30 SURGICAL SUPPLY — 40 items
ARMBAND PINK RESTRICT EXTREMIT (MISCELLANEOUS) ×2 IMPLANT
CANISTER SUCTION 2500CC (MISCELLANEOUS) ×2 IMPLANT
CATH EMB 4FR 80CM (CATHETERS) ×2 IMPLANT
CLIP TI MEDIUM 6 (CLIP) ×2 IMPLANT
CLIP TI WIDE RED SMALL 6 (CLIP) ×2 IMPLANT
CLOTH BEACON ORANGE TIMEOUT ST (SAFETY) ×2 IMPLANT
COVER SURGICAL LIGHT HANDLE (MISCELLANEOUS) ×2 IMPLANT
DERMABOND ADVANCED (GAUZE/BANDAGES/DRESSINGS) ×1
DERMABOND ADVANCED .7 DNX12 (GAUZE/BANDAGES/DRESSINGS) ×1 IMPLANT
DRAPE X-RAY CASS 24X20 (DRAPES) IMPLANT
ELECT REM PT RETURN 9FT ADLT (ELECTROSURGICAL) ×2
ELECTRODE REM PT RTRN 9FT ADLT (ELECTROSURGICAL) ×1 IMPLANT
GAUZE SPONGE 4X4 16PLY XRAY LF (GAUZE/BANDAGES/DRESSINGS) IMPLANT
GEL ULTRASOUND 20GR AQUASONIC (MISCELLANEOUS) IMPLANT
GLOVE BIO SURGEON STRL SZ 6.5 (GLOVE) ×4 IMPLANT
GLOVE BIO SURGEON STRL SZ7.5 (GLOVE) ×2 IMPLANT
GLOVE BIOGEL PI IND STRL 8 (GLOVE) ×1 IMPLANT
GLOVE BIOGEL PI INDICATOR 8 (GLOVE) ×1
GLOVE ECLIPSE 7.5 STRL STRAW (GLOVE) ×2 IMPLANT
GOWN STRL NON-REIN LRG LVL3 (GOWN DISPOSABLE) ×6 IMPLANT
GRAFT GORETEX STRT 7X10 (Vascular Products) ×2 IMPLANT
KIT BASIN OR (CUSTOM PROCEDURE TRAY) ×2 IMPLANT
KIT ROOM TURNOVER OR (KITS) ×2 IMPLANT
NEEDLE HYPO 25GX1X1/2 BEV (NEEDLE) ×2 IMPLANT
NS IRRIG 1000ML POUR BTL (IV SOLUTION) ×2 IMPLANT
PACK CV ACCESS (CUSTOM PROCEDURE TRAY) ×2 IMPLANT
PAD ARMBOARD 7.5X6 YLW CONV (MISCELLANEOUS) ×4 IMPLANT
SET COLLECT BLD 21X3/4 12 (NEEDLE) IMPLANT
SPONGE SURGIFOAM ABS GEL 100 (HEMOSTASIS) IMPLANT
STOPCOCK 4 WAY LG BORE MALE ST (IV SETS) IMPLANT
SUCTION FRAZIER TIP 10 FR DISP (SUCTIONS) ×2 IMPLANT
SUT PROLENE 6 0 BV (SUTURE) ×8 IMPLANT
SUT VIC AB 3-0 SH 27 (SUTURE) ×1
SUT VIC AB 3-0 SH 27X BRD (SUTURE) ×1 IMPLANT
SUT VICRYL 4-0 PS2 18IN ABS (SUTURE) ×2 IMPLANT
TOWEL OR 17X24 6PK STRL BLUE (TOWEL DISPOSABLE) ×2 IMPLANT
TOWEL OR 17X26 10 PK STRL BLUE (TOWEL DISPOSABLE) ×2 IMPLANT
TUBING EXTENTION W/L.L. (IV SETS) IMPLANT
UNDERPAD 30X30 INCONTINENT (UNDERPADS AND DIAPERS) ×2 IMPLANT
WATER STERILE IRR 1000ML POUR (IV SOLUTION) ×2 IMPLANT

## 2012-11-30 NOTE — Interval H&P Note (Signed)
History and Physical Interval Note:  11/30/2012 7:18 AM  [name]  has presented today for surgery, with the diagnosis of clotted AVG  The various methods of treatment have been discussed with the patient and family. After consideration of risks, benefits and other options for treatment, the patient has consented to  Procedure(s): THROMBECTOMY AND REVISION OF ARTERIOVENTOUS (AV) GORETEX  GRAFT (Left) as a surgical intervention .  The patient's history has been reviewed, patient examined, no change in status, stable for surgery.  I have reviewed the patient's chart and labs.  Questions were answered to the patient's satisfaction.     DICKSON,CHRISTOPHER S

## 2012-11-30 NOTE — Transfer of Care (Signed)
Immediate Anesthesia Transfer of Care Note  Patient: [name]  Procedure(s) Performed: Procedure(s): THROMBECTOMY AND REVISION OF ARTERIOVENTOUS (AV) GORETEX  GRAFT (Left)  Patient Location: PACU  Anesthesia Type:MAC  Level of Consciousness: awake, alert , oriented and sedated  Airway & Oxygen Therapy: Patient Spontanous Breathing and Patient connected to face mask oxygen  Post-op Assessment: Report given to PACU RN, Post -op Vital signs reviewed and stable and Patient moving all extremities  Post vital signs: Reviewed and stable  Complications: No apparent anesthesia complications

## 2012-11-30 NOTE — H&P (View-Only) (Signed)
[facility] [place]   Patient name: [name] MRN: [id number] DOB: 02/12/1951 Sex: male     Chief Complaint  Patient presents with  . PVD    6 Mo f/up with vascular lab study.    HISTORY OF PRESENT ILLNESS: The patient is back today for followup. He is status post right femoral to above-knee popliteal artery bypass graft with great toe amputation on 01/02/2012. He is also status post left femoral to below knee popliteal bypass graft on 08/15/2011 and left transmetatarsal amputation on 09/12/2011. He is back today for followup.  He has no complaints today. He continues to get dialysis via a catheter although he had a left upper arm graft placed in June  Past Medical History  Diagnosis Date  . Hyperlipidemia   . ESRD (end stage renal disease)     a. MWF dialysis in [place] (followed by Dr. Befekadu)  . Arthritis   . Claudication   . Stroke 2005  . Anemia   . Chronic diastolic CHF (congestive heart failure) 07/2010    a. 07/2008 Echo EF 50-55%, mild-mod LVH, Gr 1 DD.  . Peripheral vascular occlusive disease     a. 07/2011 Periph Angio: No signif Ao-illiac dzs, LCF stenosis, 100% LSFA w recon above knee pop and 3 vessel runoff, 100% RSFA w/ recon above knee --> pending L Fem to below Knee pop bypass.  . History of tobacco abuse   . Hypertension     takes Amlodipine,Metoprolol,and Prinivil daily  . Dyspnea on exertion     with exertion  . GERD (gastroesophageal reflux disease)   . Cataract     bilateral  . Dialysis patient   . PUD (peptic ulcer disease) mid 1990's    with GI bleed.  endoscoped in mid 1990's at [facility]    Past Surgical History  Procedure Laterality Date  . Arteriovenous graft placement    . Av fistula placement  12/10/2000    Right brachiocephalic arteriovenous   . Hernia repair    . Aortagram  07/29/2011    Abdominal Aortagram  . Cardiac catheterization  08/01/11    Left heart catheterization  . Femoral-popliteal bypass  graft  09/09/2011    Procedure: BYPASS GRAFT FEMORAL-POPLITEAL ARTERY;  Surgeon: Ukiah Trawick W Sukaina Toothaker, MD;  Location: [facility] OR;  Service: Vascular;  Laterality: Left;  . Amputation  09/12/2011    Procedure: AMPUTATION BELOW KNEE;  Surgeon: Telly Jawad W Aydn Ferrara, MD;  Location: [facility] OR;  Service: Vascular;  Laterality: Left;  TRANSMETATARSAL  . Application of wound vac  10/30/2011    Procedure: APPLICATION OF WOUND VAC;  Surgeon: Nayan Proch W Arlene Genova, MD;  Location: [facility] OR;  Service: Vascular;  Laterality: Left;  . Groin debridement  11/01/2011    Procedure: GROIN DEBRIDEMENT;  Surgeon: Christopher S Dickson, MD;  Location: [facility] OR;  Service: Vascular;  Laterality: Left;  . Esophagogastroduodenoscopy  11/06/2011    Procedure: ESOPHAGOGASTRODUODENOSCOPY (EGD);  Surgeon: Jay M Pyrtle, MD;  Location: [facility];  Service: Gastroenterology;  Laterality: N/A;  . Femoral-popliteal bypass graft  01/02/2012    Procedure: BYPASS GRAFT FEMORAL-POPLITEAL ARTERY;  Surgeon: Roshawn Lacina W Jonus Coble, MD;  Location: [facility];  Service: Vascular;  Laterality: Right;  . Amputation  01/02/2012    Procedure: AMPUTATION RAY;  Surgeon: Jerold Yoss W Enas Winchel, MD;  Location: [facility];  Service: Vascular;  Laterality: Right;  . Toe amputation Right     5/13  . Exchange of a dialysis catheter N/A 07/15/2012      Procedure: EXCHANGE OF A DIALYSIS CATHETER;  Surgeon: Brian L Chen, MD;  Location: [facility];  Service: Vascular;  Laterality: N/A;  . Av fistula placement Left 07/15/2012    Procedure: ARTERIOVENOUS (AV) FISTULA CREATION;  Surgeon: Brian L Chen, MD;  Location: [facility];  Service: Vascular;  Laterality: Left;  . Av fistula placement Left 09/07/2012    Procedure: INSERTION OF ARTERIOVENOUS (AV) GORE-TEX GRAFT ARM;  Surgeon: Brian L Chen, MD;  Location: [facility];  Service: Vascular;  Laterality: Left;  . Insertion of dialysis catheter Right 09/07/2012    Procedure: INSERTION OF a Femoral DIALYSIS CATHETER;  Surgeon: Brian L Chen, MD;  Location: [facility];  Service: Vascular;   Laterality: Right;  . Lipoma excision Left 09/07/2012    Procedure: EXCISION Seroma left arm;  Surgeon: Brian L Chen, MD;  Location: [facility];  Service: Vascular;  Laterality: Left;    History   Social History  . Marital Status: Legally Separated    Spouse Name: N/A    Number of Children: N/A  . Years of Education: N/A   Occupational History  . DISABLED    Social History Main Topics  . Smoking status: Current Every Day Smoker -- 0.30 packs/day for 40 years    Types: Cigarettes    Last Attempt to Quit: 06/13/2012  . Smokeless tobacco: Never Used     Comment: pt was given 1-800-QUIT-NOW  . Alcohol Use: No  . Drug Use: No  . Sexually Active: Not Currently   Other Topics Concern  . Not on file   Social History Narrative   Lives in [place].  His cousin helps him out and drives him to appts. He quit smoking earlier this year.    Family History  Problem Relation Age of Onset  . Breast cancer Mother   . Cancer Mother   . Cancer Father   . Anesthesia problems Neg Hx   . Hypotension Neg Hx   . Malignant hyperthermia Neg Hx   . Pseudochol deficiency Neg Hx     Allergies as of 11/01/2012 - Review Complete 11/01/2012  Allergen Reaction Noted  . Ambien (zolpidem tartrate) Other (See Comments) 01/26/2012    Current Outpatient Prescriptions on File Prior to Visit  Medication Sig Dispense Refill  . ALPRAZolam (XANAX) 1 MG tablet Take 1 tablet (1 mg total) by mouth daily.  30 tablet  0  . calcium acetate (PHOSLO) 667 MG capsule Take 1,334-2,001 mg by mouth 3 (three) times daily with meals.       . cinacalcet (SENSIPAR) 60 MG tablet Take 60 mg by mouth daily.      . DULoxetine (CYMBALTA) 20 MG capsule Take 60 mg by mouth daily.      . gabapentin (NEURONTIN) 100 MG capsule Take 100 mg by mouth daily.      . hydrALAZINE (APRESOLINE) 25 MG tablet Take 25 mg by mouth 3 (three) times daily.      . isosorbide mononitrate (IMDUR) 15 mg TB24 Take 0.5 tablets (15 mg total) by mouth daily.   15 tablet  1  . lanthanum (FOSRENOL) 1000 MG chewable tablet Chew 2 tablets (2,000 mg total) by mouth 3 (three) times daily with meals.  60 tablet  0  . multivitamin (RENA-VIT) TABS tablet Take 1 tablet by mouth daily.      . oxyCODONE-acetaminophen (PERCOCET/ROXICET) 5-325 MG per tablet Take 1 tablet by mouth every 6 (six) hours as needed for pain.  30 tablet  0  . simvastatin (ZOCOR)   5 MG tablet Take 5 mg by mouth at bedtime.      . traMADol (ULTRAM) 50 MG tablet Take 50 mg by mouth every 6 (six) hours as needed for pain. For pain       No current facility-administered medications on file prior to visit.     REVIEW OF SYSTEMS: Per the patient, no changes from prior visit  PHYSICAL EXAMINATION:   Vital signs are BP 173/74  Pulse 64  Resp 16  Ht 5' 6" (1.676 m)  Wt 150 lb (68.04 kg)  BMI 24.22 kg/m2  SpO2 92% General: The patient appears their stated age. HEENT:  No gross abnormalities Pulmonary:  Non labored breathing Musculoskeletal: There are no major deformities. Neurologic: No focal weakness or paresthesias are detected, Skin: There are no ulcer or rashes noted. Psychiatric: The patient has normal affect. Cardiovascular: There is a regular rate and rhythm without significant murmur appreciated. Pedal pulses are nonpalpable. Good thrill in left upper arm dialysis graft   Diagnostic Studies Ultrasound was performed today of his lower extremities. Bypass grafts are widely patent with biphasic waveforms.  Assessment: Status post bilateral femoral-popliteal bypass graft Plan: The patient will continue with serial surveillance ultrasound imaging, the next 30 would be in 6 months.  I told the patient start using his left upper arm dialysis graft  V. Wells Aidenjames Heckmann IV, M.D. [facility] [place] Office: [phone] Pager:  [phone]   VASCULAR QUALITY INITIATIVE FOLLOW UP DATA:  Current smoker: [  ] yes  [  ] no  Living status: [x  ]  Home  [   ] Nursing home  [  ] Homeless    MEDS:  ASA [ x ] yes  [  ] no- [  ] medical reason  [  ] non compliant  STATIN  [x  ] yes  [  ] no- [  ] medical reason  [  ] non compliant  Beta blocker [  ] yes  [  ] no- [  ] medical reason  [  ] non compliant  ACE inhibitor [  ] yes  [  ] no- [ x ] medical reason  [  ] non compliant  P2Y12 Antagonist [  x] none  [  ] clopidogrel-Plavix  [  ] ticlopidine-Ticlid   [  ] prasugrel-Effient  [  ] ticagrelor- Brilinta    Anticoagulant [  ] xNone  [  ] warfarin  [  ] rivaroxaban-Xarelto [  ] dabigatran- Pradaxa  Ambulation: [ x ] Amb  [  ] Amb with assistance  [  ] wheelchair  [  ] bedridden  Ipsilateral Sx: [ x ] none   [  ] claudication  [  ] rest pain  [  ] tissue loss  Current Patency: [  x] primary  [  ] primary-assisted  [  ] secondary  [  ] occluded  Patency judged by: [  ] doppler  [  ] palp graft pulse  [  ] palp distal pulse   [  ] ABI increase > 15%   [x  ] Duplex  If occluded, when-   Ipsilateral ABI:calcified  Ipsilateral TBI: no toes  Infection: [ x ] none  [  ] cellulitis  [  ] deep abscess  [  ] infection of artery or graft  Bypass revision: [ x ] no  [  ] yes- [  ] surg  [  ] catheter   based  [  ] both    Date:   Thrombectomy/ lysis/ revision:  [  x] no  [  ] yes- [  ] surg  [  ] catheter based  [  ] both    Date:   Major amputation: [x  ] no  [  ] minor amp  [  ] BKA  [  ] AKA   Date:     

## 2012-11-30 NOTE — Anesthesia Procedure Notes (Signed)
Procedure Name: MAC Date/Time: 11/30/2012 7:38 AM Performed by: Fransisca Kaufmann Pre-anesthesia Checklist: Patient identified, Emergency Drugs available, Suction available, Patient being monitored and Timeout performed Oxygen Delivery Method: Simple face mask

## 2012-11-30 NOTE — Anesthesia Postprocedure Evaluation (Signed)
  Anesthesia Post-op Note  Patient: [name]  Procedure(s) Performed: Procedure(s): THROMBECTOMY AND REVISION OF ARTERIOVENTOUS (AV) GORETEX  GRAFT (Left)  Patient Location: PACU  Anesthesia Type:MAC  Level of Consciousness: awake, alert  and oriented  Airway and Oxygen Therapy: Patient Spontanous Breathing  Post-op Pain: mild  Post-op Assessment: Post-op Vital signs reviewed, Patient's Cardiovascular Status Stable, Respiratory Function Stable and Patent Airway  Post-op Vital Signs: stable  Complications: No apparent anesthesia complications

## 2012-11-30 NOTE — Op Note (Signed)
NAME: [name]   MRN: [id number] DOB: 06-29-1950    DATE OF OPERATION: 11/30/2012  PREOP DIAGNOSIS: clotted left upper arm AV graft  POSTOP DIAGNOSIS: same  PROCEDURE: thrombectomy and revision of left upper arm AV graft  SURGEON: Di Kindle. Edilia Bo, MD, FACS  ASSIST: Doreatha Massed, PA  ANESTHESIA: local with sedation   EBL: minimal  INDICATIONS: [name] is a 62 y.o. male who had a left upper arm graft placed approximately 3 months ago. This clotted yesterday. He was set up for a thrombectomy.  FINDINGS: I jump higher on the axillary vein where the vein was larger.  TECHNIQUE: The patient was taken to the operating room and was sedated by anesthesia. The left upper extremity was prepped and draped in the usual sterile fashion. After the skin was infiltrated with 1% lidocaine, a longitudinal incision was made over the venous anastomosis and the venous limb of the graft was dissected free. I elected to jump up higher on the brachial vein where the vein was larger. The patient was heparinized. The graft was divided and graft thrombectomy achieved using a #4 Fogarty catheter. Excellent inflow was established to the graft which was flushed with heparinized saline implant. The vein was then ligated and the more proximal vein spatulated. A 7 mm PTFE graft was brought to the field, spatulated, and sewn end-to-end to the vein using continuous 6-0 Prolene suture. The graft was then cut to the appropriate length and anastomosed end-to-end to the old graft using continuous 6-0 Prolene suture. At the completion was an excellent thrill in the graft. The heparin was partially reversed with protamine. The wounds closed the deep layer 3-0 Vicryl and the skin closed with 4-0 Vicryl. Dermabond was applied. Patient tolerated the procedure well and was transferred to the recovery room in stable condition. All needle and sponge counts were correct.  Waverly Ferrari, MD, FACS [facility]  DATE OF DICTATION:   11/30/2012

## 2012-11-30 NOTE — Preoperative (Signed)
Beta Blockers   Reason not to administer Beta Blockers:Not Applicable 

## 2012-11-30 NOTE — Progress Notes (Signed)
Pt is not diabetic.  He has not eaten or drank anything since 1600 last night.  States feels fine.  CBG 58.  Dr Noreene Larsson notified.

## 2012-11-30 NOTE — Anesthesia Preprocedure Evaluation (Signed)
Anesthesia Evaluation  Patient identified by MRN, date of birth, ID band Patient awake    Reviewed: Allergy & Precautions, H&P , NPO status , Patient's Chart, lab work & pertinent test results  Airway Mallampati: II      Dental   Pulmonary  breath sounds clear to auscultation        Cardiovascular Rhythm:Regular Rate:Normal     Neuro/Psych    GI/Hepatic   Endo/Other    Renal/GU      Musculoskeletal   Abdominal   Peds  Hematology   Anesthesia Other Findings   Reproductive/Obstetrics                           Anesthesia Physical Anesthesia Plan  ASA: III  Anesthesia Plan: MAC   Post-op Pain Management:    Induction: Intravenous  Airway Management Planned: Natural Airway and Simple Face Mask  Additional Equipment:   Intra-op Plan:   Post-operative Plan:   Informed Consent: I have reviewed the patients History and Physical, chart, labs and discussed the procedure including the risks, benefits and alternatives for the proposed anesthesia with the patient or authorized representative who has indicated his/her understanding and acceptance.     Plan Discussed with: CRNA and Anesthesiologist  Anesthesia Plan Comments:         Anesthesia Quick Evaluation

## 2012-12-03 ENCOUNTER — Encounter (HOSPITAL_COMMUNITY): Payer: Self-pay | Admitting: Vascular Surgery

## 2012-12-07 ENCOUNTER — Ambulatory Visit (INDEPENDENT_AMBULATORY_CARE_PROVIDER_SITE_OTHER): Payer: Medicare Other | Admitting: Family Medicine

## 2012-12-07 ENCOUNTER — Encounter: Payer: Self-pay | Admitting: Family Medicine

## 2012-12-07 VITALS — BP 84/46 | HR 83 | Temp 98.7°F | Ht 66.0 in | Wt 146.8 lb

## 2012-12-07 DIAGNOSIS — G609 Hereditary and idiopathic neuropathy, unspecified: Secondary | ICD-10-CM

## 2012-12-07 MED ORDER — GABAPENTIN 300 MG PO CAPS: 300.0000 mg | ORAL_CAPSULE | Freq: Three times a day (TID) | ORAL | Status: DC

## 2012-12-07 NOTE — Patient Instructions (Signed)
Peripheral Neuropathy Peripheral neuropathy is a common disorder of your nerves resulting from damage. CAUSES  This disorder may be caused by a disease of the nerves or illness. Many neuropathies have well known causes such as:  Diabetes. This is one of the most common causes.   Uremia.   AIDS.   Nutritional deficiencies.   Other causes include mechanical pressures. These may be from:   Compression.   Injury.   Contusions or bruises.   Fracture or dislocated bones.   Pressure involving the nerves close to the surface. Nerves such as the ulnar, or radial can be injured by prolonged use of crutches.  Other injuries may come from:  Tumor.   Hemorrhage or bleeding into a nerve.   Exposure to cold or radiation.   Certain medicines or toxic substances (rare).   Vascular or collagen disorders such as:   Atherosclerosis.   Systemic lupus erythematosus.   Scleroderma.   Sarcoidosis.   Rheumatoid arthritis.   Polyarteritis nodosa.   A large number of cases are of unknown cause.  SYMPTOMS  Common problems include:  Weakness.   Numbness.   Abnormal sensations (paresthesia) such as:   Burning.   Tickling.   Pricking.   Tingling.   Pain in the arms, hands, legs and/or feet.  TREATMENT  Therapy for this disorder differs depending on the cause. It may vary from medical treatment with medications or physical therapy among others.   For example, therapy for this disorder caused by diabetes involves control of the diabetes.   In cases where a tumor or ruptured disc is the cause, therapy may involve surgery. This would be to remove the tumor or to repair the ruptured disc.   In entrapment or compression neuropathy, treatment may consist of splinting or surgical decompression of the ulnar or median nerves. A common example of entrapment neuropathy is carpal tunnel syndrome. This has become more common because of the increasing use of computers.   Peroneal and  radial compression neuropathies may require avoidance of pressure.   Physical therapy and/or splints may be useful in preventing contractures. This is a condition in which shortened muscles around joints cause abnormal and sometimes painful positioning of the joints.  Document Released: 02/28/2002 Document Revised: 11/20/2010 Document Reviewed: 03/10/2005 [employer] Patient Information 2012 [employer]. 

## 2012-12-07 NOTE — Progress Notes (Signed)
  Subjective:    Patient ID: QUARTEZ LAGOS, male    DOB: [birth year], 62 y.o.   MRN: [id number]  HPI  This 62 y.o. male presents for evaluation of needing refill on his neurontin for Peripheral neuropathy.  Review of Systems    No chest pain, SOB, HA, dizziness, vision change, N/V, diarrhea, constipation, dysuria, urinary urgency or frequency, myalgias, arthralgias or rash.  Objective:   Physical Exam Vital signs noted  Well developed well nourished male.  HEENT - Head atraumatic Normocephalic Respiratory - Lungs CTA bilateral Cardiac - RRR S1 and S2 without murmur GI - Abdomen soft Nontender and bowel sounds active x 4 Extremities - No edema. Neuro - Grossly intact.       Assessment & Plan:  Unspecified hereditary and idiopathic peripheral neuropathy - Plan: gabapentin (NEURONTIN) 300 MG capsule po tid

## 2012-12-11 ENCOUNTER — Ambulatory Visit (INDEPENDENT_AMBULATORY_CARE_PROVIDER_SITE_OTHER): Payer: Medicare Other | Admitting: Family Medicine

## 2012-12-11 VITALS — BP 135/44 | HR 74 | Temp 96.7°F | Wt 143.0 lb

## 2012-12-11 DIAGNOSIS — J209 Acute bronchitis, unspecified: Secondary | ICD-10-CM

## 2012-12-11 MED ORDER — AMOXICILLIN 875 MG PO TABS: 875.0000 mg | ORAL_TABLET | Freq: Two times a day (BID) | ORAL | Status: DC

## 2012-12-11 MED ORDER — BENZONATATE 100 MG PO CAPS: 100.0000 mg | ORAL_CAPSULE | Freq: Three times a day (TID) | ORAL | Status: DC | PRN

## 2012-12-11 NOTE — Patient Instructions (Addendum)

## 2012-12-11 NOTE — Progress Notes (Signed)
  Subjective:    Patient ID: [name], male    DOB: [birth year], 62 y.o.   MRN: [id number]  HPI  This 62 y.o. male presents for evaluation of uri sx's for over a week. He is a smoker but  Denies any SOB.  He states he did have pneumonia a few months ago.  He states he has  Productive cough and it is yellow. He denies fever.  He has hx of ESRD and is on dialysis.  Review of Systems C/o uri sx's No chest pain, SOB, HA, dizziness, vision change, N/V, diarrhea, constipation, dysuria, urinary urgency or frequency, myalgias, arthralgias or rash.     Objective:   Physical Exam  Vital signs noted  Chronically ill appearing AA male in NAD.  HEENT - Head atraumatic Normocephalic                Eyes - PERRLA, Conjuctiva - clear Sclera- Clear EOMI                Ears - EAC's Wnl TM's Wnl Gross Hearing WNL                Nose - Nares patent                 Throat - oropharanx wnl Respiratory - Lungs with few exp wheezes Cardiac - RRR S1 and S2 without murmur       Assessment & Plan:  Acute bronchitis - Plan: amoxicillin (AMOXIL) 875 MG tablet, benzonatate (TESSALON PERLES) 100 MG capsule Push po fluids, rest, tylenol otc prn pain or fever, and follow up prn if sx's continue or persist.

## 2012-12-23 ENCOUNTER — Other Ambulatory Visit: Payer: Self-pay | Admitting: Nurse Practitioner

## 2012-12-23 ENCOUNTER — Telehealth: Payer: Self-pay | Admitting: General Practice

## 2012-12-23 DIAGNOSIS — R0602 Shortness of breath: Secondary | ICD-10-CM

## 2012-12-23 NOTE — Telephone Encounter (Signed)
Patient aware.

## 2012-12-23 NOTE — Telephone Encounter (Signed)
Needs pulse oximerty testing- will order

## 2012-12-25 ENCOUNTER — Emergency Department (HOSPITAL_COMMUNITY): Payer: Medicare Other

## 2012-12-25 ENCOUNTER — Inpatient Hospital Stay (HOSPITAL_COMMUNITY)
Admission: EM | Admit: 2012-12-25 | Discharge: 2012-12-28 | DRG: 291 | Disposition: A | Discharge status: Home / Self Care | Payer: Medicare Other | Attending: Family Medicine | Admitting: Family Medicine

## 2012-12-25 ENCOUNTER — Ambulatory Visit (INDEPENDENT_AMBULATORY_CARE_PROVIDER_SITE_OTHER): Payer: Medicare Other | Admitting: Family Medicine

## 2012-12-25 ENCOUNTER — Encounter (HOSPITAL_COMMUNITY): Payer: Self-pay | Admitting: Emergency Medicine

## 2012-12-25 VITALS — BP 129/79 | HR 64 | Temp 98.1°F | Wt 141.2 lb

## 2012-12-25 DIAGNOSIS — I120 Hypertensive chronic kidney disease with stage 5 chronic kidney disease or end stage renal disease: Secondary | ICD-10-CM | POA: Diagnosis present

## 2012-12-25 DIAGNOSIS — I70219 Atherosclerosis of native arteries of extremities with intermittent claudication, unspecified extremity: Secondary | ICD-10-CM

## 2012-12-25 DIAGNOSIS — Z8673 Personal history of transient ischemic attack (TIA), and cerebral infarction without residual deficits: Secondary | ICD-10-CM

## 2012-12-25 DIAGNOSIS — J9600 Acute respiratory failure, unspecified whether with hypoxia or hypercapnia: Secondary | ICD-10-CM | POA: Diagnosis present

## 2012-12-25 DIAGNOSIS — I509 Heart failure, unspecified: Secondary | ICD-10-CM

## 2012-12-25 DIAGNOSIS — S88119A Complete traumatic amputation at level between knee and ankle, unspecified lower leg, initial encounter: Secondary | ICD-10-CM

## 2012-12-25 DIAGNOSIS — S98119A Complete traumatic amputation of unspecified great toe, initial encounter: Secondary | ICD-10-CM

## 2012-12-25 DIAGNOSIS — K219 Gastro-esophageal reflux disease without esophagitis: Secondary | ICD-10-CM | POA: Diagnosis present

## 2012-12-25 DIAGNOSIS — R0609 Other forms of dyspnea: Secondary | ICD-10-CM

## 2012-12-25 DIAGNOSIS — I2510 Atherosclerotic heart disease of native coronary artery without angina pectoris: Secondary | ICD-10-CM | POA: Diagnosis present

## 2012-12-25 DIAGNOSIS — I6350 Cerebral infarction due to unspecified occlusion or stenosis of unspecified cerebral artery: Secondary | ICD-10-CM

## 2012-12-25 DIAGNOSIS — D649 Anemia, unspecified: Secondary | ICD-10-CM | POA: Diagnosis present

## 2012-12-25 DIAGNOSIS — I739 Peripheral vascular disease, unspecified: Secondary | ICD-10-CM | POA: Diagnosis present

## 2012-12-25 DIAGNOSIS — I5031 Acute diastolic (congestive) heart failure: Secondary | ICD-10-CM

## 2012-12-25 DIAGNOSIS — N186 End stage renal disease: Secondary | ICD-10-CM

## 2012-12-25 DIAGNOSIS — E8770 Fluid overload, unspecified: Secondary | ICD-10-CM

## 2012-12-25 DIAGNOSIS — Z992 Dependence on renal dialysis: Secondary | ICD-10-CM

## 2012-12-25 DIAGNOSIS — M129 Arthropathy, unspecified: Secondary | ICD-10-CM | POA: Diagnosis present

## 2012-12-25 DIAGNOSIS — J9601 Acute respiratory failure with hypoxia: Secondary | ICD-10-CM

## 2012-12-25 DIAGNOSIS — F17200 Nicotine dependence, unspecified, uncomplicated: Secondary | ICD-10-CM | POA: Diagnosis present

## 2012-12-25 DIAGNOSIS — E785 Hyperlipidemia, unspecified: Secondary | ICD-10-CM | POA: Diagnosis present

## 2012-12-25 DIAGNOSIS — I5033 Acute on chronic diastolic (congestive) heart failure: Principal | ICD-10-CM | POA: Diagnosis present

## 2012-12-25 DIAGNOSIS — R0989 Other specified symptoms and signs involving the circulatory and respiratory systems: Secondary | ICD-10-CM | POA: Insufficient documentation

## 2012-12-25 DIAGNOSIS — I4891 Unspecified atrial fibrillation: Secondary | ICD-10-CM

## 2012-12-25 DIAGNOSIS — D631 Anemia in chronic kidney disease: Secondary | ICD-10-CM

## 2012-12-25 DIAGNOSIS — N189 Chronic kidney disease, unspecified: Secondary | ICD-10-CM

## 2012-12-25 DIAGNOSIS — J811 Chronic pulmonary edema: Secondary | ICD-10-CM

## 2012-12-25 DIAGNOSIS — J45909 Unspecified asthma, uncomplicated: Secondary | ICD-10-CM | POA: Diagnosis present

## 2012-12-25 DIAGNOSIS — I5032 Chronic diastolic (congestive) heart failure: Secondary | ICD-10-CM

## 2012-12-25 DIAGNOSIS — Z79899 Other long term (current) drug therapy: Secondary | ICD-10-CM

## 2012-12-25 DIAGNOSIS — N2581 Secondary hyperparathyroidism of renal origin: Secondary | ICD-10-CM | POA: Diagnosis present

## 2012-12-25 DIAGNOSIS — J90 Pleural effusion, not elsewhere classified: Secondary | ICD-10-CM | POA: Diagnosis present

## 2012-12-25 DIAGNOSIS — Z803 Family history of malignant neoplasm of breast: Secondary | ICD-10-CM

## 2012-12-25 LAB — TROPONIN I
Troponin I: <0.3 ng/mL (ref <0.30)
Troponin I: <0.3 ng/mL (ref <0.30)

## 2012-12-25 LAB — CBC WITH DIFFERENTIAL/PLATELET
Basophils Relative: 1 % (ref 0–1)
Eosinophils Absolute: 0.4 10*3/uL (ref 0.0–0.7)
Lymphs Abs: 1.4 10*3/uL (ref 0.7–4.0)
MCH: 28.2 pg (ref 26.0–34.0)
Neutro Abs: 3.9 10*3/uL (ref 1.7–7.7)
Neutrophils Relative %: 61 % (ref 43–77)
Platelets: 241 10*3/uL (ref 150–400)
RBC: 3.87 MIL/uL — ABNORMAL LOW (ref 4.22–5.81)

## 2012-12-25 LAB — MRSA PCR SCREENING: MRSA by PCR: NEGATIVE

## 2012-12-25 LAB — BASIC METABOLIC PANEL
Chloride: 96 mEq/L (ref 96–112)
GFR calc Af Amer: 12 mL/min — ABNORMAL LOW (ref >90)
GFR calc non Af Amer: 10 mL/min — ABNORMAL LOW (ref >90)
Glucose, Bld: 76 mg/dL (ref 70–99)
Potassium: 3.5 mEq/L (ref 3.5–5.1)
Sodium: 137 mEq/L (ref 135–145)

## 2012-12-25 MED ORDER — PANTOPRAZOLE SODIUM 40 MG PO TBEC
40.0000 mg | DELAYED_RELEASE_TABLET | Freq: Every day | ORAL | Status: DC
  Administered 2012-12-26 – 2012-12-28 (×3): 40 mg via ORAL
  Filled 2012-12-25 (×4): qty 1

## 2012-12-25 MED ORDER — SODIUM CHLORIDE 0.9 % IV SOLN: 100.0000 mL | INTRAVENOUS | Status: DC | PRN

## 2012-12-25 MED ORDER — GABAPENTIN 300 MG PO CAPS
300.0000 mg | ORAL_CAPSULE | Freq: Three times a day (TID) | ORAL | Status: DC
  Administered 2012-12-25 – 2012-12-27 (×5): 300 mg via ORAL
  Filled 2012-12-25 (×5): qty 1

## 2012-12-25 MED ORDER — ALBUTEROL SULFATE HFA 108 (90 BASE) MCG/ACT IN AERS: 1.0000 | INHALATION_SPRAY | RESPIRATORY_TRACT | Status: DC | PRN

## 2012-12-25 MED ORDER — SODIUM CHLORIDE 0.9 % IV SOLN: 250.0000 mL | INTRAVENOUS | Status: DC | PRN

## 2012-12-25 MED ORDER — ONDANSETRON HCL 4 MG/2ML IJ SOLN: 4.0000 mg | Freq: Four times a day (QID) | INTRAMUSCULAR | Status: DC | PRN

## 2012-12-25 MED ORDER — SODIUM CHLORIDE 0.9 % IJ SOLN
3.0000 mL | Freq: Two times a day (BID) | INTRAMUSCULAR | Status: DC
  Administered 2012-12-26 – 2012-12-28 (×4): 3 mL via INTRAVENOUS

## 2012-12-25 MED ORDER — OXYCODONE-ACETAMINOPHEN 5-325 MG PO TABS: 1.5000 | ORAL_TABLET | Freq: Three times a day (TID) | ORAL | Status: DC | PRN

## 2012-12-25 MED ORDER — HEPARIN SODIUM (PORCINE) 1000 UNIT/ML DIALYSIS
1000.0000 [IU] | INTRAMUSCULAR | Status: DC | PRN
  Filled 2012-12-25: qty 1

## 2012-12-25 MED ORDER — HYDRALAZINE HCL 25 MG PO TABS
25.0000 mg | ORAL_TABLET | Freq: Three times a day (TID) | ORAL | Status: DC
  Administered 2012-12-25 – 2012-12-28 (×5): 25 mg via ORAL
  Filled 2012-12-25 (×5): qty 1

## 2012-12-25 MED ORDER — ISOSORBIDE MONONITRATE ER 60 MG PO TB24
30.0000 mg | ORAL_TABLET | Freq: Every day | ORAL | Status: DC
  Administered 2012-12-28: 30 mg via ORAL
  Filled 2012-12-25 (×2): qty 1

## 2012-12-25 MED ORDER — FOLIC ACID 1 MG PO TABS
1.0000 mg | ORAL_TABLET | Freq: Every day | ORAL | Status: DC
  Administered 2012-12-26 – 2012-12-28 (×3): 1 mg via ORAL
  Filled 2012-12-25 (×3): qty 1

## 2012-12-25 MED ORDER — DULOXETINE HCL 60 MG PO CPEP
60.0000 mg | ORAL_CAPSULE | Freq: Every day | ORAL | Status: DC
  Administered 2012-12-26 – 2012-12-28 (×3): 60 mg via ORAL
  Filled 2012-12-25 (×3): qty 1

## 2012-12-25 MED ORDER — LIDOCAINE HCL (PF) 1 % IJ SOLN: 5.0000 mL | INTRAMUSCULAR | Status: DC | PRN

## 2012-12-25 MED ORDER — HEPARIN SODIUM (PORCINE) 5000 UNIT/ML IJ SOLN
5000.0000 [IU] | Freq: Three times a day (TID) | INTRAMUSCULAR | Status: DC
  Administered 2012-12-25 – 2012-12-28 (×8): 5000 [IU] via SUBCUTANEOUS
  Filled 2012-12-25 (×8): qty 1

## 2012-12-25 MED ORDER — RENA-VITE PO TABS
1.0000 | ORAL_TABLET | Freq: Every day | ORAL | Status: DC
  Administered 2012-12-26 – 2012-12-28 (×3): 1 via ORAL
  Filled 2012-12-25 (×8): qty 1

## 2012-12-25 MED ORDER — ALTEPLASE 2 MG IJ SOLR
2.0000 mg | Freq: Once | INTRAMUSCULAR | Status: AC | PRN
  Filled 2012-12-25: qty 2

## 2012-12-25 MED ORDER — TIZANIDINE HCL 4 MG PO TABS
4.0000 mg | ORAL_TABLET | Freq: Four times a day (QID) | ORAL | Status: DC | PRN
  Filled 2012-12-25: qty 1

## 2012-12-25 MED ORDER — LIDOCAINE-PRILOCAINE 2.5-2.5 % EX CREA: 1.0000 "application " | TOPICAL_CREAM | CUTANEOUS | Status: DC | PRN

## 2012-12-25 MED ORDER — ACETAMINOPHEN 325 MG PO TABS: 650.0000 mg | ORAL_TABLET | ORAL | Status: DC | PRN

## 2012-12-25 MED ORDER — HEPARIN SODIUM (PORCINE) 1000 UNIT/ML DIALYSIS
20.0000 [IU]/kg | INTRAMUSCULAR | Status: DC | PRN
  Administered 2012-12-27: 1300 [IU] via INTRAVENOUS_CENTRAL
  Filled 2012-12-25: qty 2

## 2012-12-25 MED ORDER — SODIUM CHLORIDE 0.9 % IJ SOLN: 3.0000 mL | INTRAMUSCULAR | Status: DC | PRN

## 2012-12-25 MED ORDER — PENTAFLUOROPROP-TETRAFLUOROETH EX AERO
1.0000 "application " | INHALATION_SPRAY | CUTANEOUS | Status: DC | PRN
  Filled 2012-12-25: qty 103.5

## 2012-12-25 MED ORDER — ALPRAZOLAM 1 MG PO TABS
1.0000 mg | ORAL_TABLET | Freq: Every evening | ORAL | Status: DC | PRN
  Administered 2012-12-25 – 2012-12-27 (×3): 1 mg via ORAL
  Filled 2012-12-25 (×3): qty 1

## 2012-12-25 MED ORDER — NEPRO/CARBSTEADY PO LIQD: 237.0000 mL | ORAL | Status: DC | PRN

## 2012-12-25 NOTE — ED Provider Notes (Addendum)
CSN: [id number]     Arrival date & time 12/25/12  1135 History  This chart was scribed for Earl Marion, MD by Quintella Reichert, ED scribe.  This patient was seen in room APA04/APA04 and the patient's care was started at 12:25 PM.   Chief Complaint  Patient presents with  . Shortness of Breath    The history is provided by the patient. No language interpreter was used.    HPI Comments: [name] is a 62 y.o. male with h/o CHF, anemia and ESRD (M/W/F dialysis) who presents to the Emergency Department complaining of 3 days of progressively-worsening intermittent exertional SOB with associated cough.  Pt states he feels fine at rest but becomes very short of breath when he walks or exerts himself.  Cough is intermittently productive of yellow sputum.  He had to receive oxygen for low pulse ox at his dialysis on Monday and Wednesday.  He does not have oxygen at home.  He was seeing his PCP for his SOB today and was advised that pneumonia was a possibility and to come to the ER for further evaluation.  He denies fever, chills, CP, back pain, swelling, abdominal pain, nausea, vomiting, loss of appetite, headache, sinus congestion  Nephrologist is Dr. Marykay Lex.  PCP is Dr Krista Blue   Past Medical History  Diagnosis Date  . Hyperlipidemia   . Arthritis   . Claudication   . Anemia   . Chronic diastolic CHF (congestive heart failure) 07/2010    a. 07/2008 Echo EF 50-55%, mild-mod LVH, Gr 1 DD.  Marland Kitchen Peripheral vascular occlusive disease     a. 07/2011 Periph Angio: No signif Ao-illiac dzs, LCF stenosis, 100% LSFA w recon above knee pop and 3 vessel runoff, 100% RSFA w/ recon above knee --> pending L Fem to below Knee pop bypass.  Marland Kitchen History of tobacco abuse   . Hypertension     takes Amlodipine,Metoprolol,and Prinivil daily  . Dyspnea on exertion     with exertion  . Cataract     bilateral  . Dialysis patient   . PUD (peptic ulcer disease) mid 1990's    with GI bleed.  endoscoped in mid  1990's at [facility]  . Stroke 2005    no residual  . Asthma     08/2012  . GERD (gastroesophageal reflux disease)   . History of blood transfusion     peptic ulcer  . ESRD (end stage renal disease)     a. MWF dialysis in [place] (followed by Dr. Kristian Covey)    Past Surgical History  Procedure Laterality Date  . Arteriovenous graft placement    . Av fistula placement  12/10/2000    Right brachiocephalic arteriovenous   . Aortagram  07/29/2011    Abdominal Aortagram  . Cardiac catheterization  08/01/11    Left heart catheterization  . Femoral-popliteal bypass graft  09/09/2011    Procedure: BYPASS GRAFT FEMORAL-POPLITEAL ARTERY;  Surgeon: [name], MD;  Location: [facility] OR;  Service: Vascular;  Laterality: Left;  . Amputation  09/12/2011    Procedure: AMPUTATION BELOW KNEE;  Surgeon: [name], MD;  Location: [facility] OR;  Service: Vascular;  Laterality: Left;  TRANSMETATARSAL  . Application of wound vac  10/30/2011    Procedure: APPLICATION OF WOUND VAC;  Surgeon: [name], MD;  Location: [facility] OR;  Service: Vascular;  Laterality: Left;  . Groin debridement  11/01/2011    Procedure: Drucie Ip DEBRIDEMENT;  Surgeon: Chuck Hint, MD;  Location: [facility] OR;  Service: Vascular;  Laterality: Left;  . Esophagogastroduodenoscopy  11/06/2011    Procedure: ESOPHAGOGASTRODUODENOSCOPY (EGD);  Surgeon: Beverley Fiedler, MD;  Location: [facility] ENDOSCOPY;  Service: Gastroenterology;  Laterality: N/A;  . Femoral-popliteal bypass graft  01/02/2012    Procedure: BYPASS GRAFT FEMORAL-POPLITEAL ARTERY;  Surgeon: [name], MD;  Location: [facility] OR;  Service: Vascular;  Laterality: Right;  . Amputation  01/02/2012    Procedure: AMPUTATION RAY;  Surgeon: [name], MD;  Location: [facility] OR;  Service: Vascular;  Laterality: Right;  . Toe amputation Right     5/13- great toe  . Exchange of a dialysis catheter N/A 07/15/2012    Procedure: EXCHANGE OF A DIALYSIS CATHETER;  Surgeon: Fransisco Hertz, MD;  Location:  [facility] OR;  Service: Vascular;  Laterality: N/A;  . Av fistula placement Left 07/15/2012    Procedure: ARTERIOVENOUS (AV) FISTULA CREATION;  Surgeon: Fransisco Hertz, MD;  Location: [facility] OR;  Service: Vascular;  Laterality: Left;  . Av fistula placement Left 09/07/2012    Procedure: INSERTION OF ARTERIOVENOUS (AV) GORE-TEX GRAFT ARM;  Surgeon: Fransisco Hertz, MD;  Location: [facility] OR;  Service: Vascular;  Laterality: Left;  . Insertion of dialysis catheter Right 09/07/2012    Procedure: INSERTION OF a Femoral DIALYSIS CATHETER;  Surgeon: Fransisco Hertz, MD;  Location: [facility] OR;  Service: Vascular;  Laterality: Right;  . Lipoma excision Left 09/07/2012    Procedure: EXCISION Seroma left arm;  Surgeon: Fransisco Hertz, MD;  Location: [facility] OR;  Service: Vascular;  Laterality: Left;  . Hernia repair      umbilical  . Thrombectomy and revision of arterioventous (av) goretex  graft Left 11/30/2012    Procedure: THROMBECTOMY AND REVISION OF ARTERIOVENTOUS (AV) GORETEX  GRAFT;  Surgeon: Chuck Hint, MD;  Location: [facility] OR;  Service: Vascular;  Laterality: Left;    Family History  Problem Relation Age of Onset  . Breast cancer Mother   . Cancer Mother   . Cancer Father   . Anesthesia problems Neg Hx   . Hypotension Neg Hx   . Malignant hyperthermia Neg Hx   . Pseudochol deficiency Neg Hx     History  Substance Use Topics  . Smoking status: Current Every Day Smoker -- 0.30 packs/day for 40 years    Types: Cigarettes  . Smokeless tobacco: Never Used  . Alcohol Use: No    Review of Systems  Constitutional: Negative for fever, chills, diaphoresis, appetite change and fatigue.  HENT: Negative for sore throat, mouth sores and trouble swallowing.   Eyes: Negative for visual disturbance.  Respiratory: Positive for cough and shortness of breath. Negative for chest tightness and wheezing.   Cardiovascular: Negative for chest pain.  Gastrointestinal: Negative for nausea, vomiting, abdominal pain, diarrhea and abdominal  distention.  Endocrine: Negative for polydipsia, polyphagia and polyuria.  Genitourinary: Negative for dysuria, frequency and hematuria.  Musculoskeletal: Negative for gait problem.  Skin: Negative for color change, pallor and rash.  Neurological: Negative for dizziness, syncope, light-headedness and headaches.  Hematological: Does not bruise/bleed easily.  Psychiatric/Behavioral: Negative for behavioral problems and confusion.    Allergies  Ambien  Home Medications   Current Outpatient Rx  Name  Route  Sig  Dispense  Refill  . ALPRAZolam (XANAX) 1 MG tablet   Oral   Take 1 tablet (1 mg total) by mouth daily.   30 tablet   1   . benzonatate (TESSALON PERLES) 100 MG capsule  Oral   Take 1 capsule (100 mg total) by mouth 3 (three) times daily as needed for cough.   20 capsule   0   . calcium acetate (PHOSLO) 667 MG capsule   Oral   Take 1,334-2,001 mg by mouth 3 (three) times daily with meals.          . cinacalcet (SENSIPAR) 60 MG tablet   Oral   Take 60 mg by mouth daily.         . diclofenac (VOLTAREN) 50 MG EC tablet   Oral   Take 50 mg by mouth daily.          . DULoxetine (CYMBALTA) 60 MG capsule   Oral   Take 60 mg by mouth daily.         . folic acid (FOLVITE) 1 MG tablet   Oral   Take 1 mg by mouth daily.         Marland Kitchen gabapentin (NEURONTIN) 300 MG capsule   Oral   Take 1 capsule (300 mg total) by mouth 3 (three) times daily.   90 capsule   11   . hydrALAZINE (APRESOLINE) 25 MG tablet   Oral   Take 25 mg by mouth 3 (three) times daily.         . isosorbide mononitrate (IMDUR) 30 MG 24 hr tablet   Oral   Take 30 mg by mouth daily.         Marland Kitchen lanthanum (FOSRENOL) 1000 MG chewable tablet   Oral   Chew 2 tablets (2,000 mg total) by mouth 3 (three) times daily with meals.   60 tablet   0   . multivitamin (RENA-VIT) TABS tablet   Oral   Take 1 tablet by mouth daily.         Marland Kitchen omeprazole (PRILOSEC) 20 MG capsule   Oral   Take 20  mg by mouth daily.         Marland Kitchen oxyCODONE-acetaminophen (PERCOCET) 7.5-325 MG per tablet   Oral   Take 1 tablet by mouth every 8 (eight) hours as needed for pain.   20 tablet   0   . PROAIR HFA 108 (90 BASE) MCG/ACT inhaler   Inhalation   Inhale 1 puff into the lungs every 4 (four) hours as needed for wheezing.          Marland Kitchen tiZANidine (ZANAFLEX) 4 MG tablet   Oral   Take 4 mg by mouth every 6 (six) hours as needed.           BP 164/67  Pulse 82  Temp(Src) 98.5 F (36.9 C) (Oral)  Resp 25  Ht 5\' 6"  (1.676 m)  Wt 148 lb (67.132 kg)  BMI 23.9 kg/m2  SpO2 91%  Physical Exam  Nursing note and vitals reviewed. Constitutional: He is oriented to person, place, and time. He appears well-developed and well-nourished. No distress.  HENT:  Head: Normocephalic.  Mouth/Throat: Oropharynx is clear and moist. No oropharyngeal exudate.  Eyes: Conjunctivae are normal. Pupils are equal, round, and reactive to light. No scleral icterus.  Neck: Normal range of motion. Neck supple. No JVD present. No thyromegaly present.  Cardiovascular: Normal rate and regular rhythm.  Exam reveals no gallop and no friction rub.   Murmur heard.  Systolic murmur is present with a grade of 2/6  Pulmonary/Chest: Effort normal. No respiratory distress. He has wheezes.  Diminished bibasilar breath sounds with wheezing in both bases and crackles in all fields  Abdominal: Soft.  Bowel sounds are normal. He exhibits no distension. There is no tenderness. There is no rebound.  Musculoskeletal: Normal range of motion.  Neurological: He is alert and oriented to person, place, and time.  Skin: Skin is warm and dry. No rash noted.  Psychiatric: He has a normal mood and affect. His behavior is normal.    ED Course  Procedures (including critical care time)  DIAGNOSTIC STUDIES: Oxygen Saturation is 91% on room air, low by my interpretation.    COORDINATION OF CARE: 12:30 PM-Discussed treatment plan which includes  EKG, CXR and labs with pt at bedside and pt agreed to plan.    Labs Review Labs Reviewed  CBC WITH DIFFERENTIAL - Abnormal; Notable for the following:    RBC 3.87 (*)    Hemoglobin 10.9 (*)    HCT 34.6 (*)    RDW 19.8 (*)    Eosinophils Relative 6 (*)    All other components within normal limits  BASIC METABOLIC PANEL - Abnormal; Notable for the following:    Creatinine, Ser 5.42 (*)    GFR calc non Af Amer 10 (*)    GFR calc Af Amer 12 (*)    All other components within normal limits  PRO B NATRIURETIC PEPTIDE - Abnormal; Notable for the following:    Pro B Natriuretic peptide (BNP) 32295.0 (*)    All other components within normal limits  CULTURE, BLOOD (ROUTINE X 2)  CULTURE, BLOOD (ROUTINE X 2)  TROPONIN I    Imaging Review Dg Chest Portable 1 View  12/25/2012   CLINICAL DATA:  Anemia, CHF and shortness of breath.  EXAM: PORTABLE CHEST - 1 VIEW  COMPARISON:  11/30/2012  FINDINGS: Moderately severe pulmonary edema present as well as larger bilateral pleural effusions since the prior study. The heart size is stable.  IMPRESSION: Moderate pulmonary edema with enlarged bilateral pleural effusions.   Electronically Signed   By: Irish Lack M.D.   On: 12/25/2012 12:23    EKG: Indication dyspnea. Interpretation as he depressions laterally this is unchanged versus comparison. No acute or ischemic changes. No ectopy. Sinus  based rhythm.  MDM   1. Congestive heart failure     Patient remains hypoxemic. Chest x-ray shows pulmonary edema/congestive heart failure. I discussed case with Dr. Bobbye Charleston.  He will see the patient emergency room. Please call hospitalist at his request regarding admission.  EKG showed no acute changes. No changes in rhythm. I do not know his dry weight. He was dialyzed routinely over the last 2 days ago was hypoxemic afterwards having to receive oxygen. Plan will be admission for diuresis for congestive heart failure.  I personally performed the  services described in this documentation, which was scribed in my presence. The recorded information has been reviewed and is accurate.    Earl Marion, MD 12/25/12 1358  Earl Marion, MD 12/25/12 [phone]

## 2012-12-25 NOTE — Consult Note (Signed)
[name] MRN: [id number] DOB/AGE: March 24, 1951 62 y.o. Primary Care Physician:WONG,FRANCIS PATRICK, MD Admit date: 12/25/2012 Chief Complaint:  Chief Complaint  Patient presents with  . Shortness of Breath   HPI:  Pt is 62 year old  With past medical history of ESRD who presented to ER with c/o Shortness of breath.  History of present illness dates back to past 3-4 days when pt started having dyspnea, it was progressive ,  associated with cough. Pt cough is productive, pt has yellowish sputum. Pt went to see pcp as was suggested to go to ER. Pt is on MWF schedule for Hd-pt last Hd was on Friday. Pt says he used to cut his tx short . No c/o nausea/ Vomiting  no c/o hematuria NO c/o orthopnea No c/o syncope   Past Medical History  Diagnosis Date  . Hyperlipidemia   . Arthritis   . Claudication   . Anemia   . Chronic diastolic CHF (congestive heart failure) 07/2010    a. 07/2008 Echo EF 50-55%, mild-mod LVH, Gr 1 DD.  Marland Kitchen Peripheral vascular occlusive disease     a. 07/2011 Periph Angio: No signif Ao-illiac dzs, LCF stenosis, 100% LSFA w recon above knee pop and 3 vessel runoff, 100% RSFA w/ recon above knee --> pending L Fem to below Knee pop bypass.  Marland Kitchen History of tobacco abuse   . Hypertension     takes Amlodipine,Metoprolol,and Prinivil daily  . Dyspnea on exertion     with exertion  . Cataract     bilateral  . Dialysis patient   . PUD (peptic ulcer disease) mid 1990's    with GI bleed.  endoscoped in mid 1990's at [facility]  . Stroke 2005    no residual  . Asthma     08/2012  . GERD (gastroesophageal reflux disease)   . History of blood transfusion     peptic ulcer  . ESRD (end stage renal disease)     a. MWF dialysis in [place] (followed by Dr. Kristian Covey)        Family History  Problem Relation Age of Onset  . Breast cancer Mother   . Cancer Mother   . Cancer Father   . Anesthesia problems Neg Hx   . Hypotension Neg Hx   . Malignant hyperthermia  Neg Hx   . Pseudochol deficiency Neg Hx   NO hx of ESRD  Social History:  reports that he has been smoking Cigarettes.  He has a 12 pack-year smoking history. He has never used smokeless tobacco. He reports that he does not drink alcohol or use illicit drugs.  Still smokes Allergies:  Allergies  Allergen Reactions  . Ambien [Zolpidem Tartrate] Other (See Comments)    Hallucinations    Medication Pt does not remember his med. Will ask RN / pharm tech to help with reconciling his meds.     WUJ:WJXBJ from the symptoms mentioned above,there are no other symptoms referable to all systems reviewed.    Physical Exam: Vital signs in last 24 hours: Temp:  [98.1 F (36.7 C)-98.5 F (36.9 C)] 98.5 F (36.9 C) (10/04 1156) Pulse Rate:  [64-82] 80 (10/04 1358) Resp:  [22-25] 22 (10/04 1358) BP: (129-164)/(47-79) 136/47 mmHg (10/04 1358) SpO2:  [91 %-100 %] 95 % (10/04 1358) Weight:  [141 lb 3.2 oz (64.048 kg)-148 lb (67.132 kg)] 148 lb (67.132 kg) (10/04 1156) Weight change:      Physical Exam: General- pt is awake,alert, oriented to time place and  person Resp- Mild REsp distress, Crackles + at bases CVS- S1S2 regular in rate and rhythm GIT- BS+, soft, NT, ND EXT- NO LE Edema, Cyanosis CNS- CN 2-12 grossly intact. Moving all 4 extremities Psych- normal mod and affect Access-/ AVF   Lab Results: CBC  Recent Labs  12/25/12 1300  WBC 6.5  HGB 10.9*  HCT 34.6*  PLT 241    BMET  Recent Labs  12/25/12 1300  NA 137  K 3.5  CL 96  CO2 27  GLUCOSE 76  BUN 19  CREATININE 5.42*  CALCIUM 10.1      Lab Results  Component Value Date   PTH 278.6* 11/01/2011   CALCIUM 10.1 12/25/2012   CAION 1.23 06/30/2012   PHOS 4.8* 09/13/2012   CXR  Moderate pulmonary edema with enlarged bilateral pleural effusions.     Impression: 1)Renal   ESRD  On HD  on MWF schedule Pt says" I used to cut my tx short- not now" Will dialyze  Today.  2)HTN  BP at goal Target  Organ damage  CKD CHF CVA   3)Anemia HGb at goal (9--11) Epo during HD   4)CKD Mineral-Bone Disorder PTH acceptable. Secondary Hyperparathyroidism  Present. Phosphorus at goal.   5)CHF- admitted with CHF  6)Electrolytes Normokalemic NOrmonatremic   7)Acid base Co2 at goal  8) Resp admitted with Duspnea Pleural effusion May need Pleural tap   Plan:  Will dialyze today Will use 3 k bath Will try to take 3 liters off Pt ay benefit from decub X-Ray to see if he needs Pleural tap      Gentry Seeber S 12/25/2012, 2:00 PM

## 2012-12-25 NOTE — ED Notes (Signed)
Admitting MD at bedside.

## 2012-12-25 NOTE — ED Notes (Signed)
Patient arrives POV with c/o Shortness of breath since Wednesday. Dialysis patient with M/W/F schedule and reports completed dialysis yesterday. + Cough. Dyspnea upon exertion.

## 2012-12-25 NOTE — ED Notes (Signed)
Report given to Nikki, RN unit 300. Ready to receive patient. 

## 2012-12-25 NOTE — H&P (Signed)
History and Physical  [name] ZOX:[id number] DOB: 1950-09-06 DOA: 12/25/2012  Referring physician: Dr. Rolland Porter in ED PCP: Redmond Baseman, MD   Chief Complaint: short of breath  HPI:  62 year old man presented to the emergency department with dyspnea on exertion. Initial evaluation was notable for hypoxia and dyspnea with movement. Chest x-ray suggested pleural effusions and pulmonary edema, BNP markedly elevated and patient was referred for admission for hemodialysis for volume overload.  Patient was seen by his primary care provider 9/20 for productive cough and URI symptoms. He was diagnosed with acute bronchitis at that time and started on amoxicillin. He was seen again in the office today 10/4 for cough and shortness of breath. He reported that he had been placed on oxygen while at dialysis 10/3. He was referred to the emergency department further evaluation of dyspnea on exertion.  Patient reports his symptoms began 10/1 with dyspnea on exertion. Antibiotics have not helped. Aggravating factors dyspnea. He does well at rest and has been able to sleep well at night and lie flat. He denies chest pain. He reports compliance with diet and his medications. He reports compliance with hemodialysis and that each of his 3 treatments last week. He carries a diagnosis of diastolic heart failure.  In the emergency department he was noted to be afebrile, tachypneic but otherwise stable vitals. EDP noted hypoxia, none charted. Chemistry panel is consistent with end-stage renal disease, BNP 32,295, troponin negative. Basic metabolic panel unremarkable. Chest x-ray revealed pulmonary edema and pleural effusions.  Review of Systems:  Negative for fever, visual changes, sore throat, rash, new muscle aches, chest pain, dysuria, bleeding, n/v/abdominal pain.  Past Medical History  Diagnosis Date  . Hyperlipidemia   . Arthritis   . Claudication   . Anemia   . Chronic diastolic CHF  (congestive heart failure) 07/2010    a. 07/2008 Echo EF 50-55%, mild-mod LVH, Gr 1 DD.  Marland Kitchen Peripheral vascular occlusive disease     a. 07/2011 Periph Angio: No signif Ao-illiac dzs, LCF stenosis, 100% LSFA w recon above knee pop and 3 vessel runoff, 100% RSFA w/ recon above knee --> pending L Fem to below Knee pop bypass.  Marland Kitchen History of tobacco abuse   . Hypertension     takes Amlodipine,Metoprolol,and Prinivil daily  . Dyspnea on exertion     with exertion  . Cataract     bilateral  . Dialysis patient   . PUD (peptic ulcer disease) mid 1990's    with GI bleed.  endoscoped in mid 1990's at [facility]  . Stroke 2005    no residual  . Asthma     08/2012  . GERD (gastroesophageal reflux disease)   . History of blood transfusion     peptic ulcer  . ESRD (end stage renal disease)     a. MWF dialysis in [place] (followed by Dr. Kristian Covey)    Past Surgical History  Procedure Laterality Date  . Arteriovenous graft placement    . Av fistula placement  12/10/2000    Right brachiocephalic arteriovenous   . Aortagram  07/29/2011    Abdominal Aortagram  . Cardiac catheterization  08/01/11    Left heart catheterization  . Femoral-popliteal bypass graft  09/09/2011    Procedure: BYPASS GRAFT FEMORAL-POPLITEAL ARTERY;  Surgeon: [name], MD;  Location: [facility] OR;  Service: Vascular;  Laterality: Left;  . Amputation  09/12/2011    Procedure: AMPUTATION BELOW KNEE;  Surgeon: [name], MD;  Location: [facility]  OR;  Service: Vascular;  Laterality: Left;  TRANSMETATARSAL  . Application of wound vac  10/30/2011    Procedure: APPLICATION OF WOUND VAC;  Surgeon: [name], MD;  Location: [facility] OR;  Service: Vascular;  Laterality: Left;  . Groin debridement  11/01/2011    Procedure: Drucie Ip DEBRIDEMENT;  Surgeon: Chuck Hint, MD;  Location: [facility] OR;  Service: Vascular;  Laterality: Left;  . Esophagogastroduodenoscopy  11/06/2011    Procedure: ESOPHAGOGASTRODUODENOSCOPY (EGD);  Surgeon: Beverley Fiedler, MD;  Location: [facility] ENDOSCOPY;  Service: Gastroenterology;  Laterality: N/A;  . Femoral-popliteal bypass graft  01/02/2012    Procedure: BYPASS GRAFT FEMORAL-POPLITEAL ARTERY;  Surgeon: [name], MD;  Location: [facility] OR;  Service: Vascular;  Laterality: Right;  . Amputation  01/02/2012    Procedure: AMPUTATION RAY;  Surgeon: [name], MD;  Location: [facility] OR;  Service: Vascular;  Laterality: Right;  . Toe amputation Right     5/13- great toe  . Exchange of a dialysis catheter N/A 07/15/2012    Procedure: EXCHANGE OF A DIALYSIS CATHETER;  Surgeon: Fransisco Hertz, MD;  Location: [facility] OR;  Service: Vascular;  Laterality: N/A;  . Av fistula placement Left 07/15/2012    Procedure: ARTERIOVENOUS (AV) FISTULA CREATION;  Surgeon: Fransisco Hertz, MD;  Location: [facility] OR;  Service: Vascular;  Laterality: Left;  . Av fistula placement Left 09/07/2012    Procedure: INSERTION OF ARTERIOVENOUS (AV) GORE-TEX GRAFT ARM;  Surgeon: Fransisco Hertz, MD;  Location: [facility] OR;  Service: Vascular;  Laterality: Left;  . Insertion of dialysis catheter Right 09/07/2012    Procedure: INSERTION OF a Femoral DIALYSIS CATHETER;  Surgeon: Fransisco Hertz, MD;  Location: [facility] OR;  Service: Vascular;  Laterality: Right;  . Lipoma excision Left 09/07/2012    Procedure: EXCISION Seroma left arm;  Surgeon: Fransisco Hertz, MD;  Location: [facility] OR;  Service: Vascular;  Laterality: Left;  . Hernia repair      umbilical  . Thrombectomy and revision of arterioventous (av) goretex  graft Left 11/30/2012    Procedure: THROMBECTOMY AND REVISION OF ARTERIOVENTOUS (AV) GORETEX  GRAFT;  Surgeon: Chuck Hint, MD;  Location: [facility] OR;  Service: Vascular;  Laterality: Left;    Social History:  reports that he has been smoking Cigarettes.  He has a 12 pack-year smoking history. He has never used smokeless tobacco. He reports that he does not drink alcohol or use illicit drugs.  Allergies  Allergen Reactions  . Ambien [Zolpidem Tartrate] Other (See  Comments)    Hallucinations    Family History  Problem Relation Age of Onset  . Breast cancer Mother   . Cancer Mother   . Cancer Father   . Anesthesia problems Neg Hx   . Hypotension Neg Hx   . Malignant hyperthermia Neg Hx   . Pseudochol deficiency Neg Hx      Prior to Admission medications   Medication Sig Start Date End Date Taking? Authorizing Provider  ALPRAZolam Prudy Feeler) 1 MG tablet Take 1 tablet (1 mg total) by mouth daily. 11/25/12  Yes Mae Shelda Altes, FNP  benzonatate (TESSALON PERLES) 100 MG capsule Take 1 capsule (100 mg total) by mouth 3 (three) times daily as needed for cough. 12/11/12  Yes Deatra Canter, FNP  calcium acetate (PHOSLO) 667 MG capsule Take 1,334-2,001 mg by mouth 3 (three) times daily with meals.    Yes Historical Provider, MD  cinacalcet (SENSIPAR) 60 MG tablet Take 60 mg by mouth  daily.   Yes Historical Provider, MD  diclofenac (VOLTAREN) 50 MG EC tablet Take 50 mg by mouth daily.  10/25/12  Yes Historical Provider, MD  DULoxetine (CYMBALTA) 60 MG capsule Take 60 mg by mouth daily.   Yes Historical Provider, MD  folic acid (FOLVITE) 1 MG tablet Take 1 mg by mouth daily.   Yes Historical Provider, MD  gabapentin (NEURONTIN) 300 MG capsule Take 1 capsule (300 mg total) by mouth 3 (three) times daily. 12/07/12  Yes Deatra Canter, FNP  hydrALAZINE (APRESOLINE) 25 MG tablet Take 25 mg by mouth 3 (three) times daily.   Yes Historical Provider, MD  isosorbide mononitrate (IMDUR) 30 MG 24 hr tablet Take 30 mg by mouth daily.   Yes Historical Provider, MD  lanthanum (FOSRENOL) 1000 MG chewable tablet Chew 2 tablets (2,000 mg total) by mouth 3 (three) times daily with meals. 09/15/12  Yes Bernadene Person, NP  multivitamin (RENA-VIT) TABS tablet Take 1 tablet by mouth daily.   Yes Historical Provider, MD  omeprazole (PRILOSEC) 20 MG capsule Take 20 mg by mouth daily.   Yes Historical Provider, MD  oxyCODONE-acetaminophen (PERCOCET) 7.5-325 MG per tablet Take 1  tablet by mouth every 8 (eight) hours as needed for pain. 11/30/12  Yes Samantha J Rhyne, PA-C  PROAIR HFA 108 (90 BASE) MCG/ACT inhaler Inhale 1 puff into the lungs every 4 (four) hours as needed for wheezing.  10/22/12  Yes Historical Provider, MD  tiZANidine (ZANAFLEX) 4 MG tablet Take 4 mg by mouth every 6 (six) hours as needed.  12/21/12  Yes Historical Provider, MD   Physical Exam: Filed Vitals:   12/25/12 1156 12/25/12 1200 12/25/12 1358  BP: 164/67 157/57 136/47  Pulse: 82 82 80  Temp: 98.5 F (36.9 C)    TempSrc: Oral    Resp: 25 25 22   Height: 5\' 6"  (1.676 m)    Weight: 67.132 kg (148 lb)    SpO2: 91% 100% 95%   General: Examined in the emergency department. Appears calm and comfortable, lying flat Eyes: PERRL, normal lids, irises  ENT: grossly normal hearing, lips & tongue Neck: no LAD, masses or thyromegaly Cardiovascular: RRR, no m/r/g. No LE edema. Respiratory: Coarse breath sounds bilaterally, some wheezes and inspiratory crackles. Abdomen: soft, ntnd Skin: no rash or induration seen  Musculoskeletal: grossly normal tone BUE/BLE Psychiatric: grossly normal mood and affect, speech fluent and appropriate Neurologic: grossly non-focal.  Wt Readings from Last 3 Encounters:  12/25/12 67.132 kg (148 lb)  12/25/12 64.048 kg (141 lb 3.2 oz)  12/11/12 64.864 kg (143 lb)    Labs on Admission:  Basic Metabolic Panel:  Recent Labs Lab 12/25/12 1300  NA 137  K 3.5  CL 96  CO2 27  GLUCOSE 76  BUN 19  CREATININE 5.42*  CALCIUM 10.1   CBC:  Recent Labs Lab 12/25/12 1300  WBC 6.5  NEUTROABS 3.9  HGB 10.9*  HCT 34.6*  MCV 89.4  PLT 241    Cardiac Enzymes:  Recent Labs Lab 12/25/12 1300  TROPONINI <0.30     Recent Labs  12/25/12 1300  PROBNP 32295.0*     Radiological Exams on Admission: Dg Chest Portable 1 View  12/25/2012   CLINICAL DATA:  Anemia, CHF and shortness of breath.  EXAM: PORTABLE CHEST - 1 VIEW  COMPARISON:  11/30/2012  FINDINGS:  Moderately severe pulmonary edema present as well as larger bilateral pleural effusions since the prior study. The heart size is stable.  IMPRESSION: Moderate  pulmonary edema with enlarged bilateral pleural effusions.   Electronically Signed   By: Irish Lack M.D.   On: 12/25/2012 12:23    EKG: Independently reviewed. Normal sinus rhythm. Lateral ST depression. Compared to previous study 09/09/2012 lateral ST depression is old. Suspect this is reflective of left ventricular hypertrophy and repolarization abnormality.   Principal Problem:   Pulmonary edema Active Problems:   Chronic diastolic CHF (congestive heart failure)   ESRD on hemodialysis   Assessment/Plan 1. Pulmonary edema, Acute congestive heart failure: Secondary to volume overload, possible diastolic component. No history or signs or symptoms to suggest ACS. Reports compliance with medications and diet. 2-D echocardiogram 08/2012: Left ventricular ejection fraction 50-55%. No comment on diastolic function. 2. Bilateral pleural effusions: Repeat chest x-ray in the morning after dialysis. Currently the patient's stable from a respiratory standpoint. If pleural effusions persist could consider thoracentesis, no immediate need for this. 3. Acute respiratory failure with reported hypoxia: Per EDP this was present with movement. I do not see any documentation of hypoxia otherwise. 4. End-stage renal disease: Dialysis per nephrology. 5. Cigarette smoker: Recommend cessation.  Code Status: Full code DVT prophylaxis: Heparin Family Communication: None present Disposition Plan/Anticipated LOS: Admission, 1-2 days.  Time spent: 50 minutes  Brendia Sacks, MD  [facility] Pager [phone] 12/25/2012, 2:08 PM

## 2012-12-25 NOTE — Plan of Care (Signed)
Problem: Consults Goal: Heart Failure Patient Education (See Patient Education module for education specifics.) Outcome: Completed/Met Date Met:  12/25/12 Patient given heart failure packet

## 2012-12-25 NOTE — Progress Notes (Signed)
Patient ID: [name], male   DOB: 1950/07/16, 62 y.o.   MRN: [id number] SUBJECTIVE: CC: Chief Complaint  Patient presents with  . Acute Visit    saw bill in sept for cough was put on antibiotic and states  cough is worse. c/o SOB      HPI: Patient on Dialysis. Also a regulkar smoker and has COPD. Was SOB yesterday at Dialysis and given O2. Has had Chest cold for several weeks. Coughing productive of phlegm.  No fever. Occasional wheeze. Just cannot get rid of the chest cold. No chest pain.  Past Medical History  Diagnosis Date  . Hyperlipidemia   . Arthritis   . Claudication   . Anemia   . Chronic diastolic CHF (congestive heart failure) 07/2010    a. 07/2008 Echo EF 50-55%, mild-mod LVH, Gr 1 DD.  Marland Kitchen Peripheral vascular occlusive disease     a. 07/2011 Periph Angio: No signif Ao-illiac dzs, LCF stenosis, 100% LSFA w recon above knee pop and 3 vessel runoff, 100% RSFA w/ recon above knee --> pending L Fem to below Knee pop bypass.  Marland Kitchen History of tobacco abuse   . Hypertension     takes Amlodipine,Metoprolol,and Prinivil daily  . Dyspnea on exertion     with exertion  . Cataract     bilateral  . Dialysis patient   . PUD (peptic ulcer disease) mid 1990's    with GI bleed.  endoscoped in mid 1990's at [facility]  . Stroke 2005    no residual  . Asthma     08/2012  . GERD (gastroesophageal reflux disease)   . History of blood transfusion     peptic ulcer  . ESRD (end stage renal disease)     a. MWF dialysis in [place] (followed by Dr. Kristian Covey)   Past Surgical History  Procedure Laterality Date  . Arteriovenous graft placement    . Av fistula placement  12/10/2000    Right brachiocephalic arteriovenous   . Aortagram  07/29/2011    Abdominal Aortagram  . Cardiac catheterization  08/01/11    Left heart catheterization  . Femoral-popliteal bypass graft  09/09/2011    Procedure: BYPASS GRAFT FEMORAL-POPLITEAL ARTERY;  Surgeon: [name], MD;  Location: [facility] OR;   Service: Vascular;  Laterality: Left;  . Amputation  09/12/2011    Procedure: AMPUTATION BELOW KNEE;  Surgeon: [name], MD;  Location: [facility] OR;  Service: Vascular;  Laterality: Left;  TRANSMETATARSAL  . Application of wound vac  10/30/2011    Procedure: APPLICATION OF WOUND VAC;  Surgeon: [name], MD;  Location: [facility] OR;  Service: Vascular;  Laterality: Left;  . Groin debridement  11/01/2011    Procedure: Drucie Ip DEBRIDEMENT;  Surgeon: Chuck Hint, MD;  Location: [facility] OR;  Service: Vascular;  Laterality: Left;  . Esophagogastroduodenoscopy  11/06/2011    Procedure: ESOPHAGOGASTRODUODENOSCOPY (EGD);  Surgeon: Beverley Fiedler, MD;  Location: [facility] ENDOSCOPY;  Service: Gastroenterology;  Laterality: N/A;  . Femoral-popliteal bypass graft  01/02/2012    Procedure: BYPASS GRAFT FEMORAL-POPLITEAL ARTERY;  Surgeon: [name], MD;  Location: [facility] OR;  Service: Vascular;  Laterality: Right;  . Amputation  01/02/2012    Procedure: AMPUTATION RAY;  Surgeon: [name], MD;  Location: [facility] OR;  Service: Vascular;  Laterality: Right;  . Toe amputation Right     5/13- great toe  . Exchange of a dialysis catheter N/A 07/15/2012    Procedure: EXCHANGE OF A DIALYSIS CATHETER;  Surgeon: Fransisco Hertz, MD;  Location: [facility] OR;  Service: Vascular;  Laterality: N/A;  . Av fistula placement Left 07/15/2012    Procedure: ARTERIOVENOUS (AV) FISTULA CREATION;  Surgeon: Fransisco Hertz, MD;  Location: [facility] OR;  Service: Vascular;  Laterality: Left;  . Av fistula placement Left 09/07/2012    Procedure: INSERTION OF ARTERIOVENOUS (AV) GORE-TEX GRAFT ARM;  Surgeon: Fransisco Hertz, MD;  Location: [facility] OR;  Service: Vascular;  Laterality: Left;  . Insertion of dialysis catheter Right 09/07/2012    Procedure: INSERTION OF a Femoral DIALYSIS CATHETER;  Surgeon: Fransisco Hertz, MD;  Location: [facility] OR;  Service: Vascular;  Laterality: Right;  . Lipoma excision Left 09/07/2012    Procedure: EXCISION Seroma left arm;  Surgeon: Fransisco Hertz, MD;  Location: [facility] OR;  Service: Vascular;  Laterality: Left;  . Hernia repair      umbilical  . Thrombectomy and revision of arterioventous (av) goretex  graft Left 11/30/2012    Procedure: THROMBECTOMY AND REVISION OF ARTERIOVENTOUS (AV) GORETEX  GRAFT;  Surgeon: Chuck Hint, MD;  Location: [facility] OR;  Service: Vascular;  Laterality: Left;   History   Social History  . Marital Status: Legally Separated    Spouse Name: N/A    Number of Children: N/A  . Years of Education: N/A   Occupational History  . DISABLED    Social History Main Topics  . Smoking status: Current Every Day Smoker -- 0.30 packs/day for 40 years    Types: Cigarettes  . Smokeless tobacco: Never Used  . Alcohol Use: No  . Drug Use: No  . Sexual Activity: Not Currently   Other Topics Concern  . Not on file   Social History Narrative   Lives in [place].  His cousin helps him out and drives him to appts. He quit smoking earlier this year.   Family History  Problem Relation Age of Onset  . Breast cancer Mother   . Cancer Mother   . Cancer Father   . Anesthesia problems Neg Hx   . Hypotension Neg Hx   . Malignant hyperthermia Neg Hx   . Pseudochol deficiency Neg Hx    Current Outpatient Prescriptions on File Prior to Visit  Medication Sig Dispense Refill  . ALPRAZolam (XANAX) 1 MG tablet Take 1 tablet (1 mg total) by mouth daily.  30 tablet  1  . benzonatate (TESSALON PERLES) 100 MG capsule Take 1 capsule (100 mg total) by mouth 3 (three) times daily as needed for cough.  20 capsule  0  . calcium acetate (PHOSLO) 667 MG capsule Take 1,334-2,001 mg by mouth 3 (three) times daily with meals.       . cinacalcet (SENSIPAR) 60 MG tablet Take 60 mg by mouth daily.      . diclofenac (VOLTAREN) 50 MG EC tablet Take 50 mg by mouth daily.       . DULoxetine (CYMBALTA) 60 MG capsule Take 60 mg by mouth daily.      . folic acid (FOLVITE) 1 MG tablet Take 1 mg by mouth daily.      Marland Kitchen gabapentin (NEURONTIN) 300  MG capsule Take 1 capsule (300 mg total) by mouth 3 (three) times daily.  90 capsule  11  . multivitamin (RENA-VIT) TABS tablet Take 1 tablet by mouth daily.      Marland Kitchen omeprazole (PRILOSEC) 20 MG capsule Take 20 mg by mouth daily.      Marland Kitchen oxyCODONE-acetaminophen (PERCOCET) 7.5-325 MG per tablet  Take 1 tablet by mouth every 8 (eight) hours as needed for pain.  20 tablet  0  . PROAIR HFA 108 (90 BASE) MCG/ACT inhaler Inhale 1 puff into the lungs every 4 (four) hours as needed for wheezing.       . hydrALAZINE (APRESOLINE) 25 MG tablet Take 25 mg by mouth 3 (three) times daily.      . isosorbide mononitrate (IMDUR) 30 MG 24 hr tablet Take 30 mg by mouth daily.      Marland Kitchen lanthanum (FOSRENOL) 1000 MG chewable tablet Chew 2 tablets (2,000 mg total) by mouth 3 (three) times daily with meals.  60 tablet  0   No current facility-administered medications on file prior to visit.   Allergies  Allergen Reactions  . Ambien [Zolpidem Tartrate] Other (See Comments)    Hallucinations    There is no immunization history on file for this patient. Prior to Admission medications   Medication Sig Start Date End Date Taking? Authorizing Provider  ALPRAZolam Prudy Feeler) 1 MG tablet Take 1 tablet (1 mg total) by mouth daily. 11/25/12  Yes Mae Shelda Altes, FNP  benzonatate (TESSALON PERLES) 100 MG capsule Take 1 capsule (100 mg total) by mouth 3 (three) times daily as needed for cough. 12/11/12  Yes Deatra Canter, FNP  calcium acetate (PHOSLO) 667 MG capsule Take 1,334-2,001 mg by mouth 3 (three) times daily with meals.    Yes Historical Provider, MD  cinacalcet (SENSIPAR) 60 MG tablet Take 60 mg by mouth daily.   Yes Historical Provider, MD  diclofenac (VOLTAREN) 50 MG EC tablet Take 50 mg by mouth daily.  10/25/12  Yes Historical Provider, MD  DULoxetine (CYMBALTA) 60 MG capsule Take 60 mg by mouth daily.   Yes Historical Provider, MD  folic acid (FOLVITE) 1 MG tablet Take 1 mg by mouth daily.   Yes Historical Provider, MD   gabapentin (NEURONTIN) 300 MG capsule Take 1 capsule (300 mg total) by mouth 3 (three) times daily. 12/07/12  Yes Deatra Canter, FNP  methylPREDNIsolone (MEDROL DOSPACK) 4 MG tablet  11/24/12  Yes Historical Provider, MD  multivitamin (RENA-VIT) TABS tablet Take 1 tablet by mouth daily.   Yes Historical Provider, MD  omeprazole (PRILOSEC) 20 MG capsule Take 20 mg by mouth daily.   Yes Historical Provider, MD  oxyCODONE-acetaminophen (PERCOCET) 7.5-325 MG per tablet Take 1 tablet by mouth every 8 (eight) hours as needed for pain. 11/30/12  Yes Samantha J Rhyne, PA-C  PROAIR HFA 108 (90 BASE) MCG/ACT inhaler Inhale 1 puff into the lungs every 4 (four) hours as needed for wheezing.  10/22/12  Yes Historical Provider, MD  hydrALAZINE (APRESOLINE) 25 MG tablet Take 25 mg by mouth 3 (three) times daily.    Historical Provider, MD  isosorbide mononitrate (IMDUR) 30 MG 24 hr tablet Take 30 mg by mouth daily.    Historical Provider, MD  lanthanum (FOSRENOL) 1000 MG chewable tablet Chew 2 tablets (2,000 mg total) by mouth 3 (three) times daily with meals. 09/15/12   Bernadene Person, NP     ROS: As above in the HPI. All other systems are stable or negative.  OBJECTIVE: APPEARANCE:  Patient in no acute distress.The patient appeared well nourished and normally developed. Acyanotic. Waist: VITAL SIGNS:BP 129/79  Pulse 64  Temp(Src) 98.1 F (36.7 C) (Oral)  Wt 141 lb 3.2 oz (64.048 kg)  BMI 22.8 kg/m2  SpO2 %  AAM unable to get O2 sat reading due to the clubbing and nail  discoloration. However, patient is in no distress.    SKIN: warm and  Dry without overt rashes, tattoos and scars  HEAD and Neck: without JVD, Head and scalp: normal Eyes:No scleral icterus. Fundi normal, eye movements normal. Ears: Auricle normal, canal normal, Tympanic membranes normal, insufflation normal. Nose: normal Throat: normal Neck & thyroid: normal  CHEST & LUNGS: Chest wall: normal Lungs: Rales in bases,  rhonchi and scatterred wheezes.  CVS: Reveals the PMI to be normally located. Regular rhythm, First and Second Heart sounds are normal,  absence of murmurs, rubs or gallops.  ABDOMEN:  Appearance: normal Benign, no organomegaly, no masses, no Abdominal Aortic enlargement. No Guarding , no rebound. No Bruits. Bowel sounds: normal   EXTREMETIES: nonedematous.  MUSCULOSKELETAL:  Spine: normal Joints: intact   ASSESSMENT: Chest congestion  Anemia, chronic renal failure - baseline hgb 9-10  Atherosclerosis of native arteries of the extremities with intermittent claudication  Atrial fibrillation  Chronic diastolic CHF (congestive heart failure)  CAD (coronary artery disease)  CKD (chronic kidney disease) stage V requiring chronic dialysis  CVA  Dyspnea on exertion   PLAN: This patient needs work up in the ED setting today.  Family to take to [facility] ED. Charge nurse informed.  Stable to travel by private vehicle.  No Follow-up on file.  Jujuan Dugo P. Modesto Charon, M.D.

## 2012-12-26 ENCOUNTER — Inpatient Hospital Stay (HOSPITAL_COMMUNITY): Payer: Medicare Other

## 2012-12-26 DIAGNOSIS — E8779 Other fluid overload: Secondary | ICD-10-CM

## 2012-12-26 DIAGNOSIS — E8770 Fluid overload, unspecified: Secondary | ICD-10-CM

## 2012-12-26 DIAGNOSIS — I5031 Acute diastolic (congestive) heart failure: Secondary | ICD-10-CM

## 2012-12-26 LAB — BASIC METABOLIC PANEL
BUN: 10 mg/dL (ref 6–23)
CO2: 29 mEq/L (ref 19–32)
Chloride: 97 mEq/L (ref 96–112)
Creatinine, Ser: 3.41 mg/dL — ABNORMAL HIGH (ref 0.50–1.35)
GFR calc Af Amer: 20 mL/min — ABNORMAL LOW (ref >90)
Potassium: 4 mEq/L (ref 3.5–5.1)
Sodium: 139 mEq/L (ref 135–145)

## 2012-12-26 LAB — TROPONIN I: Troponin I: <0.3 ng/mL (ref <0.30)

## 2012-12-26 NOTE — Progress Notes (Signed)
Subjective: Interval History: has complaints. He complaints of cough nonproductive. His difficulty in breathing has improved and he doesn't have any nausea vomiting. Presently patient also denies any chest pain. Objective: Vital signs in last 24 hours: Temp:  [97.2 F (36.2 C)-98.5 F (36.9 C)] 97.2 F (36.2 C) (10/05 0445) Pulse Rate:  [64-95] 77 (10/05 0445) Resp:  [20-28] 20 (10/05 0445) BP: (99-175)/(35-79) 108/36 mmHg (10/05 0445) SpO2:  [91 %-100 %] 97 % (10/05 0445) Weight:  [63.9 kg (140 lb 14 oz)-67.132 kg (148 lb)] 65.59 kg (144 lb 9.6 oz) (10/05 0445) Weight change:   Intake/Output from previous day: 10/04 0701 - 10/05 0700 In: 200 [P.O.:200] Out: 3000  Intake/Output this shift: Total I/O In: 100 [P.O.:100] Out: -   Patient in no apparent distress Chest decreased breath sound bilaterally he has some expiratory wheezing. Heart exam regular rate and rhythm Abdomen soft nontender Extremities no edema  Lab Results:  Recent Labs  12/25/12 1300  WBC 6.5  HGB 10.9*  HCT 34.6*  PLT 241   BMET:  Recent Labs  12/25/12 1300 12/26/12 0415  NA 137 139  K 3.5 4.0  CL 96 97  CO2 27 29  GLUCOSE 76 75  BUN 19 10  CREATININE 5.42* 3.41*  CALCIUM 10.1 10.3   No results found for this basename: PTH,  in the last 72 hours Iron Studies: No results found for this basename: IRON, TIBC, TRANSFERRIN, FERRITIN,  in the last 72 hours  Studies/Results: Dg Chest Portable 1 View  12/25/2012   CLINICAL DATA:  Anemia, CHF and shortness of breath.  EXAM: PORTABLE CHEST - 1 VIEW  COMPARISON:  11/30/2012  FINDINGS: Moderately severe pulmonary edema present as well as larger bilateral pleural effusions since the prior study. The heart size is stable.  IMPRESSION: Moderate pulmonary edema with enlarged bilateral pleural effusions.   Electronically Signed   By: Irish Lack M.D.   On: 12/25/2012 12:23    I have reviewed the patient's current  medications.  Assessment/Plan: Problem #1 end-stage renal disease is status post hemodialysis yesterday BUN and creatinine was in acceptable range. Problem #2 difficulty in breathing and hypoxemia. Possibly multifactorial including CHF, and bilateral pleural effusion.  Problem #3 pleural effusion possibly parapneumonic however as this moment cannot rule out uremic pleural effusion. Patient advocacy has been good also last couple of months.  Problem #4 history of hypertension. Recently patient with hypotension and most of his current hypertensive medications have been DC'd. Problem #5 history of peripheral vascular disease Problem #6 metabolic bone disease. Patient also the time with high phosphorus and is on a binder. Problem #7 coronary artery disease Problem #8 anemia his hemoglobin and hematocrit is was in acceptable range and is on Epogen. Plan: We'll make arrangements for patient to get daily dialysis to see if that will help his pleural effusion. If not and if patient difficulty in breathing doesn't improve probably patient benefit from thoracentesis. We'll check his CBC, basic metabolic panel and phosphorus in the morning.   LOS: 1 day   Cid Agena S 12/26/2012,9:01 AM

## 2012-12-26 NOTE — Progress Notes (Signed)
[facility] PROGRESS NOTE  [name] [id number]:[id number] DOB: 01-Feb-1951 DOA: 12/25/2012 PCP: Redmond Baseman, MD  Assessment/Plan: 1. Bilateral pulmonary edema, possible acute heart failure, possibly diastolic: Believed to be secondary to volume overload. No signs or symptoms to suggest ACS and troponins negative. Reports compliance with medications and diet. 2-D echocardiogram 08/2012: Left ventricular ejection fraction 50-55%. No comment on diastolic function. No significant change today on x-ray but subjectively the patient feels better. 2. Left pleural effusion, possibly loculated: Plan as below 3. Acute respiratory failure with hypoxia: Appears to be improving. Wean oxygen. 4. End-stage renal disease on hemodialysis 5. Cigarette smoker: Recommend cessation.   Wean oxygen.  Dialysis again today per nephrology.  CT chest 10/6 further evaluate effusions and possible loculated fluid.   Consider thoracentesis 10/6 although patient asymptomatic at this point  Pending studies:     Code Status: Full code DVT prophylaxis: Heparin Family Communication: None present Disposition Plan: Home when improved  Brendia Sacks, MD  [facility]  Pager [phone] If 7PM-7AM, please contact night-coverage at [email or web address], password [facility] 12/26/2012, 12:40 PM  LOS: 1 day   Summary: 62 year old man presented to the emergency department with dyspnea on exertion. Initial evaluation was notable for hypoxia and dyspnea with movement. Chest x-ray suggested pleural effusions and pulmonary edema, BNP markedly elevated and patient was referred for admission for hemodialysis for volume overload.  Consultants:  nephrology  Procedures:  HD  Antibiotics:    HPI/Subjective: Feels better today. Breathing better. No complaints. Successful dialysis yesterday with removal of 3 L.  Objective: Filed Vitals:   12/25/12 2200 12/25/12 2215 12/25/12 2247 12/26/12 0445  BP: 144/46 162/44   108/36  Pulse: 82 81 80 77  Temp:  98.4 F (36.9 C) 98.4 F (36.9 C) 97.2 F (36.2 C)  TempSrc:  Oral  Oral  Resp:  20  20  Height:      Weight:  63.9 kg (140 lb 14 oz)  65.59 kg (144 lb 9.6 oz)  SpO2:   93% 97%    Intake/Output Summary (Last 24 hours) at 12/26/12 1240 Last data filed at 12/26/12 0840  Gross per 24 hour  Intake    300 ml  Output   3000 ml  Net  -2700 ml     Filed Weights   12/25/12 1753 12/25/12 2215 12/26/12 0445  Weight: 67 kg (147 lb 11.3 oz) 63.9 kg (140 lb 14 oz) 65.59 kg (144 lb 9.6 oz)    Exam:   Afebrile, vital signs stable. 97% on 3 L.  General: Appears calm and comfortable. Lying flat.  Cardiovascular: Regular rate and rhythm. No murmur, rub, gallop. No significant lower extremity edema.  Respiratory: Clear to auscultation bilaterally. No wheezes, rales or rhonchi. Normal respiratory effort.  Psychiatric: Grossly normal and affect. Speech fluent and appropriate.  Data Reviewed:  Basic metabolic panel unremarkable  Troponins negative  Repeat chest x-ray without significant change, bilateral pleural effusions, cardiomegaly and bilateral airspace disease representing edema again noted  Left pleural effusion consistent with loculated fluid.  Scheduled Meds: . DULoxetine  60 mg Oral Daily  . folic acid  1 mg Oral Daily  . gabapentin  300 mg Oral TID  . heparin  5,000 Units Subcutaneous Q8H  . hydrALAZINE  25 mg Oral TID  . isosorbide mononitrate  30 mg Oral Daily  . multivitamin  1 tablet Oral Daily  . pantoprazole  40 mg Oral Daily  . sodium chloride  3 mL Intravenous Q12H   Continuous  Infusions:   Principal Problem:   Pulmonary edema Active Problems:   Chronic diastolic CHF (congestive heart failure)   ESRD on hemodialysis   Time spent 20 minutes

## 2012-12-27 ENCOUNTER — Inpatient Hospital Stay (HOSPITAL_COMMUNITY): Payer: Medicare Other

## 2012-12-27 LAB — BASIC METABOLIC PANEL
CO2: 28 mEq/L (ref 19–32)
Calcium: 9.9 mg/dL (ref 8.4–10.5)
Chloride: 95 mEq/L — ABNORMAL LOW (ref 96–112)
Creatinine, Ser: 5.88 mg/dL — ABNORMAL HIGH (ref 0.50–1.35)
Glucose, Bld: 90 mg/dL (ref 70–99)
Potassium: 3.3 mEq/L — ABNORMAL LOW (ref 3.5–5.1)
Sodium: 138 mEq/L (ref 135–145)

## 2012-12-27 LAB — HEPATITIS B SURFACE ANTIGEN: Hepatitis B Surface Ag: NEGATIVE

## 2012-12-27 MED ORDER — SODIUM CHLORIDE 0.9 % IV SOLN: 100.0000 mL | INTRAVENOUS | Status: DC | PRN

## 2012-12-27 MED ORDER — GABAPENTIN 100 MG PO CAPS
100.0000 mg | ORAL_CAPSULE | Freq: Three times a day (TID) | ORAL | Status: DC
  Administered 2012-12-27 – 2012-12-28 (×2): 100 mg via ORAL
  Filled 2012-12-27 (×2): qty 1

## 2012-12-27 MED ORDER — IOHEXOL 300 MG/ML  SOLN
80.0000 mL | Freq: Once | INTRAMUSCULAR | Status: AC | PRN
  Administered 2012-12-27: 80 mL via INTRAVENOUS

## 2012-12-27 MED ORDER — ALTEPLASE 2 MG IJ SOLR
2.0000 mg | Freq: Once | INTRAMUSCULAR | Status: AC | PRN
  Filled 2012-12-27: qty 2

## 2012-12-27 NOTE — Progress Notes (Signed)
Subjective: Interval History: has complaints. Patient still has some cough  But improving. His difficulty in breathing has improved and he doesn't have any nausea vomiting. Presently patient also denies any chest pain. Objective: Vital signs in last 24 hours: Temp:  [98.4 F (36.9 C)-98.7 F (37.1 C)] 98.4 F (36.9 C) (10/06 0400) Pulse Rate:  [71-73] 71 (10/06 0400) Resp:  [20] 20 (10/06 0400) BP: (114-143)/(54-56) 143/54 mmHg (10/06 0400) SpO2:  [91 %-94 %] 91 % (10/06 0400) Weight:  [65.998 kg (145 lb 8 oz)] 65.998 kg (145 lb 8 oz) (10/06 0400) Weight change: -1.134 kg (-2 lb 8 oz)  Intake/Output from previous day: 10/05 0701 - 10/06 0700 In: 820 [P.O.:820] Out: -  Intake/Output this shift:    Patient in no apparent distress Chest decreased breath sound bilaterally he has some expiratory wheezing. Heart exam regular rate and rhythm Abdomen soft nontender Extremities no edema  Lab Results:  Recent Labs  12/25/12 1300  WBC 6.5  HGB 10.9*  HCT 34.6*  PLT 241   BMET:   Recent Labs  12/26/12 0415 12/27/12 0501  NA 139 138  K 4.0 3.3*  CL 97 95*  CO2 29 28  GLUCOSE 75 90  BUN 10 28*  CREATININE 3.41* 5.88*  CALCIUM 10.3 9.9   No results found for this basename: PTH,  in the last 72 hours Iron Studies: No results found for this basename: IRON, TIBC, TRANSFERRIN, FERRITIN,  in the last 72 hours  Studies/Results: Dg Chest 2 View  12/26/2012   CLINICAL DATA:  Cough and congestion  EXAM: CHEST  2 VIEW  COMPARISON:  Chest radiograph 12/25/2012  FINDINGS: Normal stress that stable enlarged cardiac silhouette. There are bilateral pleural effusions unchanged from prior. There is bibasilar airspace disease suggesting edema which is also unchanged. Basilar atelectasis is unchanged. No pneumothorax.  IMPRESSION: 1. No significant change. 2. Cardiomegaly, bilateral pleural effusions, bibasilar airspace disease (likely representing edema) and atelectasis.   Electronically  Signed   By: Genevive Bi M.D.   On: 12/26/2012 09:12   Dg Chest Right Decubitus  12/26/2012   CLINICAL DATA:  Pleural effusions.  EXAM: CHEST - RIGHT DECUBITUS  COMPARISON:  Chest radiographs today and yesterday.  FINDINGS: Decubitus film with the right side down show similar appearance to a likely partially loculated and predominantly lateral left pleural effusion and shows little change in position. No significant component of layering right pleural fluid is identified. Bilateral lower lobe atelectasis/infiltrates present.  IMPRESSION: The left pleural effusion shows little change in position with the patient in a decubitus position consistent with loculated fluid. No significant component of a free-flowing right pleural effusion is identified.   Electronically Signed   By: Irish Lack M.D.   On: 12/26/2012 09:08   Dg Chest Portable 1 View  12/25/2012   CLINICAL DATA:  Anemia, CHF and shortness of breath.  EXAM: PORTABLE CHEST - 1 VIEW  COMPARISON:  11/30/2012  FINDINGS: Moderately severe pulmonary edema present as well as larger bilateral pleural effusions since the prior study. The heart size is stable.  IMPRESSION: Moderate pulmonary edema with enlarged bilateral pleural effusions.   Electronically Signed   By: Irish Lack M.D.   On: 12/25/2012 12:23    I have reviewed the patient's current medications.  Assessment/Plan: Problem #1 end-stage renal disease is status post hemodialysis yesterday BUN and creatinine with in acceptable range. No uremic sign and sympptoms Problem #2 difficulty in breathing and hypoxemia. Possibly multifactorial including CHF, and  bilateral pleural effusion. It is getting better.  Problem #3 pleural effusion possibly parapneumonic however as this moment cannot rule out uremic pleural effusion. Patient advocacy has been good also last couple of months.  Problem #4 history of hypertension. Recently patient with hypotension and most of his current hypertensive  medications have been DC'd. Problem #5 history of peripheral vascular disease Problem #6 metabolic bone disease. Patient also the time with high phosphorus and is on a binder. Problem #7 coronary artery disease Problem #8 anemia his hemoglobin and hematocrit is with  in acceptable range and is on Epogen. Plan: We'll make arrangements for patient to get daily dialysis to see if that will help his pleural effusion. If not and if patient difficulty in breathing doesn't improve probably patient benefit from thoracentesis. We'll check his CBC, basic metabolic panel and phosphorus in the morning.   LOS: 2 days   Harvie Morua S 12/27/2012,7:50 AM

## 2012-12-27 NOTE — Care Management Note (Addendum)
    Page 1 of 1   12/28/2012     11:39:30 AM   CARE MANAGEMENT NOTE 12/28/2012  Patient:  [name], [name]   Account Number:  [id number]  Date Initiated:  12/27/2012  Documentation initiated by:  Sharrie Rothman  Subjective/Objective Assessment:   Pt admitted from home with CHF. Pt has a CAP aide 4 hours a day, M-F and 2 hours on Sat and Sun. Pt states that during the time he is alone, he is fairly independent. Pt receives dialysis M-W-F at [facility] in [place] and rides [employer].     Action/Plan:   No CM needs noted.   Anticipated DC Date:  12/29/2012   Anticipated DC Plan:  HOME/SELF CARE      DC Planning Services  CM consult      Choice offered to / List presented to:             Status of service:  Completed, signed off Medicare Important Message given?  YES (If response is "NO", the following Medicare IM given date fields will be blank) Date Medicare IM given:  12/28/2012 Date Additional Medicare IM given:    Discharge Disposition:  HOME/SELF CARE  Per UR Regulation:    If discussed at Long Length of Stay Meetings, dates discussed:    Comments:  12/28/12 1140 Arlyss Queen, RN BSN CM Pt discharged home today. No CM needs noted. Pt to resume outpt dialysis at [facility] in [place] on 12/29/12.  12/27/12 1155 Arlyss Queen, RN BSN CM

## 2012-12-27 NOTE — Progress Notes (Signed)
[facility] PROGRESS NOTE  [name] ZOX:[id number] DOB: 1950/11/05 DOA: 12/25/2012 PCP: Redmond Baseman, MD  Assessment/Plan: 1. Bilateral pulmonary edema, possible acute heart failure, possibly diastolic: Appears clinically improved. Believed to be secondary to volume overload. No signs or symptoms to suggest ACS and troponins negative. Reports compliance with medications and diet. 2-D echocardiogram 08/2012: Left ventricular ejection fraction 50-55%. No comment on diastolic function.  2. Left pleural effusion, possibly loculated: CT chest to further evaluate. No symptoms to suggest infection at this point. 3. Acute respiratory failure with hypoxia: Appears resolved. Weaned off oxygen. 4. End-stage renal disease on hemodialysis 5. Cigarette smoker: Recommend cessation.   Dialysis again today per nephrology.  CT chest to further evaluate effusions and possible loculated fluid. He has no evidence to suggest infection at this point.  Consider thoracentesis if clinically indicated.  Pending studies:   Blood cultures  Code Status: Full code DVT prophylaxis: Heparin Family Communication: None present Disposition Plan: Home when improved  Brendia Sacks, MD  [facility]  Pager [phone] If 7PM-7AM, please contact night-coverage at [email or web address], password [facility] 12/27/2012, 12:55 PM  LOS: 2 days   Summary: 62 year old man presented to the emergency department with dyspnea on exertion. Initial evaluation was notable for hypoxia and dyspnea with movement. Chest x-ray suggested pleural effusions and pulmonary edema, BNP markedly elevated and patient was referred for admission for hemodialysis for volume overload.  Consultants:  nephrology  Procedures:  HD  Antibiotics:    HPI/Subjective: Continues to feel better. Some cough. Breathing better but not yet back to normal.  Objective: Filed Vitals:   12/26/12 0445 12/26/12 1449 12/26/12 2024 12/27/12 0400   BP: 108/36  114/56 143/54  Pulse: 77  73 71  Temp: 97.2 F (36.2 C)  98.7 F (37.1 C) 98.4 F (36.9 C)  TempSrc: Oral  Oral Oral  Resp: 20  20 20   Height:      Weight: 65.59 kg (144 lb 9.6 oz)   65.998 kg (145 lb 8 oz)  SpO2: 97% 94% 93% 91%    Intake/Output Summary (Last 24 hours) at 12/27/12 1255 Last data filed at 12/27/12 1250  Gross per 24 hour  Intake   1200 ml  Output      0 ml  Net   1200 ml     Filed Weights   12/25/12 2215 12/26/12 0445 12/27/12 0400  Weight: 63.9 kg (140 lb 14 oz) 65.59 kg (144 lb 9.6 oz) 65.998 kg (145 lb 8 oz)    Exam:   Afebrile, vital signs stable. Oxygenation 91% on room air.  General: Appears calm and comfortable. Sitting on the side of the bed.  Cardiovascular: Regular rate and rhythm. No murmur, rub, gallop. No significant lower extremity edema.  Respiratory: Clear to auscultation bilaterally. No wheezes, rales or rhonchi. Normal respiratory effort.  Psychiatric: Grossly normal and affect. Speech fluent and appropriate.  Data Reviewed:  Basic metabolic panel unremarkable  Scheduled Meds: . DULoxetine  60 mg Oral Daily  . folic acid  1 mg Oral Daily  . gabapentin  100 mg Oral TID  . heparin  5,000 Units Subcutaneous Q8H  . hydrALAZINE  25 mg Oral TID  . isosorbide mononitrate  30 mg Oral Daily  . multivitamin  1 tablet Oral Daily  . pantoprazole  40 mg Oral Daily  . sodium chloride  3 mL Intravenous Q12H   Continuous Infusions:   Principal Problem:   Pulmonary edema Active Problems:   Pleural effusion   ESRD  on hemodialysis   Acute diastolic heart failure   Volume overload   Time spent 15 minutes

## 2012-12-27 NOTE — Progress Notes (Signed)
UR chart review completed.  

## 2012-12-28 LAB — BASIC METABOLIC PANEL
Calcium: 10.4 mg/dL (ref 8.4–10.5)
Chloride: 96 mEq/L (ref 96–112)
GFR calc non Af Amer: 12 mL/min — ABNORMAL LOW (ref >90)
Glucose, Bld: 83 mg/dL (ref 70–99)
Potassium: 3.7 mEq/L (ref 3.5–5.1)
Sodium: 139 mEq/L (ref 135–145)

## 2012-12-28 LAB — CBC
Hemoglobin: 11.1 g/dL — ABNORMAL LOW (ref 13.0–17.0)
MCH: 27.4 pg (ref 26.0–34.0)
RBC: 4.05 MIL/uL — ABNORMAL LOW (ref 4.22–5.81)
WBC: 5.2 10*3/uL (ref 4.0–10.5)

## 2012-12-28 LAB — PHOSPHORUS: Phosphorus: 4.7 mg/dL — ABNORMAL HIGH (ref 2.3–4.6)

## 2012-12-28 NOTE — Progress Notes (Signed)
Subjective: Interval History: has complaints. Patient feels much better. Presently no cough and no difficulty in breathing  .  He doesn't have any nausea vomiting. Presently patient also denies any chest pain and wants to go home Objective: Vital signs in last 24 hours: Temp:  [97.5 F (36.4 C)-98.8 F (37.1 C)] 98.8 F (37.1 C) (10/07 0355) Pulse Rate:  [77-96] 77 (10/07 0355) Resp:  [14-20] 20 (10/07 0355) BP: (90-196)/(22-90) 167/66 mmHg (10/07 0355) SpO2:  [95 %-96 %] 95 % (10/07 0355) Weight:  [61.2 kg (134 lb 14.7 oz)-64.411 kg (142 lb)] 64.411 kg (142 lb) (10/07 0458) Weight change: -2.598 kg (-5 lb 11.7 oz)  Intake/Output from previous day: 10/06 0701 - 10/07 0700 In: 480 [P.O.:480] Out: 2209  Intake/Output this shift:    Patient in no apparent distress Chest decreased breath sound bilaterally he has some expiratory wheezing.No rales or egophony  Heart exam regular rate and rhythm Abdomen soft nonetender.Posetive bowl sound Extremities no edema  Lab Results:  Recent Labs  12/25/12 1300 12/28/12 0504  WBC 6.5 5.2  HGB 10.9* 11.1*  HCT 34.6* 36.1*  PLT 241 212   BMET:   Recent Labs  12/27/12 0501 12/28/12 0504  NA 138 139  K 3.3* 3.7  CL 95* 96  CO2 28 30  GLUCOSE 90 83  BUN 28* 17  CREATININE 5.88* 4.54*  CALCIUM 9.9 10.4   No results found for this basename: PTH,  in the last 72 hours Iron Studies: No results found for this basename: IRON, TIBC, TRANSFERRIN, FERRITIN,  in the last 72 hours  Studies/Results: Dg Chest 2 View  12/26/2012   CLINICAL DATA:  Cough and congestion  EXAM: CHEST  2 VIEW  COMPARISON:  Chest radiograph 12/25/2012  FINDINGS: Normal stress that stable enlarged cardiac silhouette. There are bilateral pleural effusions unchanged from prior. There is bibasilar airspace disease suggesting edema which is also unchanged. Basilar atelectasis is unchanged. No pneumothorax.  IMPRESSION: 1. No significant change. 2. Cardiomegaly, bilateral  pleural effusions, bibasilar airspace disease (likely representing edema) and atelectasis.   Electronically Signed   By: Genevive Bi M.D.   On: 12/26/2012 09:12   Dg Chest Right Decubitus  12/26/2012   CLINICAL DATA:  Pleural effusions.  EXAM: CHEST - RIGHT DECUBITUS  COMPARISON:  Chest radiographs today and yesterday.  FINDINGS: Decubitus film with the right side down show similar appearance to a likely partially loculated and predominantly lateral left pleural effusion and shows little change in position. No significant component of layering right pleural fluid is identified. Bilateral lower lobe atelectasis/infiltrates present.  IMPRESSION: The left pleural effusion shows little change in position with the patient in a decubitus position consistent with loculated fluid. No significant component of a free-flowing right pleural effusion is identified.   Electronically Signed   By: Irish Lack M.D.   On: 12/26/2012 09:08    I have reviewed the patient's current medications.  Assessment/Plan: Problem #1 end-stage renal disease is status post hemodialysis yesterday BUN and creatinine with in acceptable range. No uremic sign and sympptoms Problem #2 difficulty in breathing and hypoxemia. Possibly multifactorial including CHF, and bilateral pleural effusion. It is getting better.  Problem #3 pleural effusion possibly parapneumonic . Presently seems to be loculated and no significant change   Problem #4 history of hypertension. His blood pressure is reasonably controlled Problem #5 history of peripheral vascular disease Problem #6 metabolic bone disease. Patient also the time with high phosphorus is reasonably controlled. Problem #7 coronary  artery disease Problem #8 anemia his hemoglobin and hematocrit is with  in acceptable range and is on Epogen. Plan:His next dialysis will be tomorrow and will go to his unit if he is discharged.   LOS: 3 days   Wilmer Berryhill S 12/28/2012,7:35  AM

## 2012-12-28 NOTE — Discharge Summary (Signed)
Physician Discharge Summary  Eugine Bubb Monter RUE:[id number] DOB: July 02, 1950 DOA: 12/25/2012  PCP: Redmond Baseman, MD  Admit date: 12/25/2012 Discharge date: 12/28/2012  Recommendations for Outpatient Follow-up:  1. Followup chronic left pleural effusion 2. 2 need to encourage smoking cessation  Follow-up Information   Follow up with Redmond Baseman, MD In 1 week.   Specialty:  Family Medicine   Contact information:   [address] [place] [address] [phone]      Discharge Diagnoses:  1. Bilateral pulmonary edema, possible acute diastolic heart failure 2. Volume overload in patient on hemodialysis 3. Chronic left pleural effusion 4. Acute respiratory failure with hypoxia 5. End-stage renal disease on hemodialysis 6. Cigarette smoker  Discharge Condition: Improved Disposition: Home  Diet recommendation: Heart healthy, renal diet  Filed Weights   12/27/12 1500 12/27/12 1846 12/28/12 0458  Weight: 63.4 kg (139 lb 12.4 oz) 61.2 kg (134 lb 14.7 oz) 64.411 kg (142 lb)    History of present illness:  62 year old man presented to the emergency department with dyspnea on exertion. Initial evaluation was notable for hypoxia and dyspnea with movement. Chest x-ray suggested pleural effusions and pulmonary edema, BNP markedly elevated and patient was referred for admission for hemodialysis for volume overload.   Hospital Course:  Mr. Papesh was admitted for further evaluation of hypoxia and shortness of breath. Imaging and history suggesting volume overload in the context of end-stage renal disease, possibly contributed to by acute diastolic dysfunction. Also noted were bilateral pleural effusions. He improved gradually with hemodialysis with resolution of hypoxia. Currently he is at baseline. Chest CT but reveal a moderate left-sided pleural effusion. The patient is completely asymptomatic. Do not think this effusion was contributory and recommend outpatient  followup.  Plan:  1. Bilateral pulmonary edema, possible acute heart failure, possibly diastolic: Resolved. Believed to be secondary to volume overload. No signs or symptoms to suggest ACS and troponins negative. Reports compliance with medications and diet. 2-D echocardiogram 08/2012: Left ventricular ejection fraction 50-55%. No comment on diastolic function. Do not think left pleural effusion is contributing. Stop diclofenac. 2. Left pleural effusion: CT chest confirmed a moderate pleural effusion. No evidence of infection. Associated atelectasis. Patient feels quite well, no hypoxia, ambulating without difficulty. Reviewing previous chest x-rays left pleural effusion has been present since at least 09/2012. Given his clinical improvement and lack of symptoms to suggest infection as well as chronicity of effusion will defer thoracentesis. Recommend outpatient followup. 3. Acute respiratory failure with hypoxia: Resolved. Weaned off oxygen. 4. End-stage renal disease on hemodialysis 5. Cigarette smoker: Recommend cessation.  Consultants:  nephrology Procedures:  HD  Discharge Instructions  Discharge Orders   Future Appointments Provider Department Dept Phone   02/01/2013 8:40 AM Coralie Keens, FNP [facility] [phone]   05/09/2013 1:00 PM [facility] [place] [facility] [address] [phone]   05/09/2013 1:30 PM [facility] [place] [facility] [address] [phone]   05/09/2013 2:40 PM Carma Lair Nickel, NP [facility] [phone]   Future Orders Complete By Expires   Diet - low sodium heart healthy  As directed    Discharge instructions  As directed    Comments:     Resume dialysis at your usual center 10/8. Call your physician or seek any medical attention for increased shortness of breath, cough or worsening of condition. Continue renal diet.   Increase activity slowly  As directed        Medication  List  STOP taking these medications       diclofenac 50 MG EC tablet  Commonly known as:  VOLTAREN      TAKE these medications       ALPRAZolam 1 MG tablet  Commonly known as:  XANAX  Take 1 tablet (1 mg total) by mouth daily.     benzonatate 100 MG capsule  Commonly known as:  TESSALON PERLES  Take 1 capsule (100 mg total) by mouth 3 (three) times daily as needed for cough.     cinacalcet 60 MG tablet  Commonly known as:  SENSIPAR  Take 60 mg by mouth daily.     DULoxetine 60 MG capsule  Commonly known as:  CYMBALTA  Take 60 mg by mouth daily.     folic acid 1 MG tablet  Commonly known as:  FOLVITE  Take 1 mg by mouth daily.     gabapentin 300 MG capsule  Commonly known as:  NEURONTIN  Take 1 capsule (300 mg total) by mouth 3 (three) times daily.     hydrALAZINE 25 MG tablet  Commonly known as:  APRESOLINE  Take 25 mg by mouth 3 (three) times daily.     isosorbide mononitrate 30 MG 24 hr tablet  Commonly known as:  IMDUR  Take 30 mg by mouth daily.     lanthanum 1000 MG chewable tablet  Commonly known as:  FOSRENOL  Chew 2 tablets (2,000 mg total) by mouth 3 (three) times daily with meals.     multivitamin Tabs tablet  Take 1 tablet by mouth daily.     omeprazole 20 MG capsule  Commonly known as:  PRILOSEC  Take 20 mg by mouth daily.     oxyCODONE-acetaminophen 7.5-325 MG per tablet  Commonly known as:  PERCOCET  Take 1 tablet by mouth every 8 (eight) hours as needed for pain.     PHOSLO 667 MG capsule  Generic drug:  calcium acetate  Take 1,334-2,001 mg by mouth 3 (three) times daily with meals.     PROAIR HFA 108 (90 BASE) MCG/ACT inhaler  Generic drug:  albuterol  Inhale 1 puff into the lungs every 4 (four) hours as needed for wheezing.     tiZANidine 4 MG tablet  Commonly known as:  ZANAFLEX  Take 4 mg by mouth every 6 (six) hours as needed.       Allergies  Allergen Reactions  . Ambien [Zolpidem Tartrate] Other (See Comments)     Hallucinations    The results of significant diagnostics from this hospitalization (including imaging, microbiology, ancillary and laboratory) are listed below for reference.    Significant Diagnostic Studies: Dg Chest 2 View  12/26/2012   CLINICAL DATA:  Cough and congestion  EXAM: CHEST  2 VIEW  COMPARISON:  Chest radiograph 12/25/2012  FINDINGS: Normal stress that stable enlarged cardiac silhouette. There are bilateral pleural effusions unchanged from prior. There is bibasilar airspace disease suggesting edema which is also unchanged. Basilar atelectasis is unchanged. No pneumothorax.  IMPRESSION: 1. No significant change. 2. Cardiomegaly, bilateral pleural effusions, bibasilar airspace disease (likely representing edema) and atelectasis.   Electronically Signed   By: Genevive Bi M.D.   On: 12/26/2012 09:12   Ct Chest W Contrast  12/28/2012   CLINICAL DATA:  Shortness of breath, acute bronchitis, productive cough, pulmonary edema  EXAM: CT CHEST WITH CONTRAST  TECHNIQUE: Multidetector CT imaging of the chest was performed during intravenous contrast administration.  CONTRAST:  80mL OMNIPAQUE IOHEXOL 300 MG/ML  SOLN  COMPARISON:  Chest radiograph dated 12/26/2012. CT chest dated 08/06/2012. CT abdomen pelvis dated 10/31/2011.  FINDINGS: Mild patchy ground-glass opacity in the left upper lobe (series 3/ image 24) and right middle lobe (series 3/ image 31), suggesting mild interstitial edema. Small right pleural effusion, chronic. Moderate left pleural effusion, increased. Associated right middle, lingular, and bilateral lower lobe atelectasis. Underlying mild paraseptal emphysematous changes. No pneumothorax.  Visualized thyroid is unremarkable.  The heart is top-normal in size. No pericardial effusion. Coronary atherosclerosis. Atherosclerotic calcifications of the aortic arch.  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  Visualized upper abdomen is notable for  multiple complex left renal cysts, many of which are calcified, measuring up to 6.9 cm (series 2/ image 65). Right renal atrophy with multiple cysts. Extensive vascular calcifications. This appearance is unchanged from prior 2013 CT.  Degenerative changes of the visualized thoracolumbar spine.  IMPRESSION: Possible mild interstitial edema the.  Moderate left pleural effusion, increased. Small right pleural effusion, chronic.  Associated right middle, lingular, and bilateral lower lobe atelectasis.   Electronically Signed   By: Charline Bills M.D.   On: 12/28/2012 08:21   Dg Chest Right Decubitus  12/26/2012   CLINICAL DATA:  Pleural effusions.  EXAM: CHEST - RIGHT DECUBITUS  COMPARISON:  Chest radiographs today and yesterday.  FINDINGS: Decubitus film with the right side down show similar appearance to a likely partially loculated and predominantly lateral left pleural effusion and shows little change in position. No significant component of layering right pleural fluid is identified. Bilateral lower lobe atelectasis/infiltrates present.  IMPRESSION: The left pleural effusion shows little change in position with the patient in a decubitus position consistent with loculated fluid. No significant component of a free-flowing right pleural effusion is identified.   Electronically Signed   By: Irish Lack M.D.   On: 12/26/2012 09:08   Dg Chest Portable 1 View  12/25/2012   CLINICAL DATA:  Anemia, CHF and shortness of breath.  EXAM: PORTABLE CHEST - 1 VIEW  COMPARISON:  11/30/2012  FINDINGS: Moderately severe pulmonary edema present as well as larger bilateral pleural effusions since the prior study. The heart size is stable.  IMPRESSION: Moderate pulmonary edema with enlarged bilateral pleural effusions.   Electronically Signed   By: Irish Lack M.D.   On: 12/25/2012 12:23    Microbiology: Recent Results (from the past 240 hour(s))  CULTURE, BLOOD (ROUTINE X 2)     Status: None   Collection Time     12/25/12  1:30 PM      Result Value Range Status   Specimen Description     Final   Value: BLOOD BLOOD RIGHT ARM BOTTLES DRAWN AEROBIC AND ANAEROBIC   Special Requests 8CC   Final   Culture PENDING   Incomplete   Report Status PENDING   Incomplete  CULTURE, BLOOD (ROUTINE X 2)     Status: None   Collection Time    12/25/12  1:45 PM      Result Value Range Status   Specimen Description     Final   Value: BLOOD BLOOD RIGHT HAND BOTTLES DRAWN AEROBIC AND ANAEROBIC   Special Requests 8CC   Final   Culture PENDING   Incomplete   Report Status PENDING   Incomplete  MRSA PCR SCREENING     Status: None   Collection Time    12/25/12  4:28 PM      Result Value Range Status  MRSA by PCR NEGATIVE  NEGATIVE Final   Comment:            The GeneXpert MRSA Assay (FDA     approved for NASAL specimens     only), is one component of a     comprehensive MRSA colonization     surveillance program. It is not     intended to diagnose MRSA     infection nor to guide or     monitor treatment for     MRSA infections.     Labs: Basic Metabolic Panel:  Recent Labs Lab 12/25/12 1300 12/26/12 0415 12/27/12 0501 12/28/12 0504  NA 137 139 138 139  K 3.5 4.0 3.3* 3.7  CL 96 97 95* 96  CO2 27 29 28 30   GLUCOSE 76 75 90 83  BUN 19 10 28* 17  CREATININE 5.42* 3.41* 5.88* 4.54*  CALCIUM 10.1 10.3 9.9 10.4  PHOS  --   --   --  4.7*     Recent Labs Lab 12/25/12 1300 12/28/12 0504  WBC 6.5 5.2  NEUTROABS 3.9  --   HGB 10.9* 11.1*  HCT 34.6* 36.1*  MCV 89.4 89.1  PLT 241 212   Cardiac Enzymes:  Recent Labs Lab 12/25/12 1300 12/25/12 1823 12/26/12 0415  TROPONINI <0.30 <0.30 <0.30     Recent Labs  12/25/12 1300  PROBNP 32295.0*    Principal Problem:   Pulmonary edema Active Problems:   Pleural effusion   ESRD on hemodialysis   Acute diastolic heart failure   Volume overload   Time coordinating discharge: 25 minutes  Signed:  Brendia Sacks, MD [facility] 12/28/2012, 10:50 AM

## 2012-12-28 NOTE — Progress Notes (Signed)
[facility] PROGRESS NOTE  CILLIAN GWINNER NFA:[id number] DOB: 09/23/1950 DOA: 12/25/2012 PCP: Redmond Baseman, MD  Assessment/Plan: 1. Bilateral pulmonary edema, possible acute heart failure, possibly diastolic: Resolved. Believed to be secondary to volume overload. No signs or symptoms to suggest ACS and troponins negative. Reports compliance with medications and diet. 2-D echocardiogram 08/2012: Left ventricular ejection fraction 50-55%. No comment on diastolic function. Do not think left pleural effusion is contributing. Stop diclofenac. 2. Left pleural effusion: CT chest confirmed a moderate pleural effusion. No evidence of infection. Associated atelectasis. Patient feels quite well, no hypoxia, ambulating without difficulty. Reviewing previous chest x-rays left pleural effusion has been present since at least 09/2012. Given his clinical improvement and lack of symptoms to suggest infection as well as chronicity of effusion will defer thoracentesis. Recommend outpatient followup. 3. Acute respiratory failure with hypoxia: Resolved. Weaned off oxygen. 4. End-stage renal disease on hemodialysis 5. Cigarette smoker: Recommend cessation.   Resume outpatient dialysis 10/8  Discharge home  Pending studies:   Blood cultures  Code Status: Full code DVT prophylaxis: Heparin Family Communication: None present Disposition Plan: Home  Brendia Sacks, MD  [facility]  Pager [phone] If 7PM-7AM, please contact night-coverage at [email or web address], password [facility] 12/28/2012, 10:36 AM  LOS: 3 days   Summary: 62 year old man presented to the emergency department with dyspnea on exertion. Initial evaluation was notable for hypoxia and dyspnea with movement. Chest x-ray suggested pleural effusions and pulmonary edema, BNP markedly elevated and patient was referred for admission for hemodialysis for volume  overload.  Consultants:  nephrology  Procedures:  HD  Antibiotics:    HPI/Subjective: Feels very well. No shortness of breath. Ambulating without difficulty. Wants to go home.  Objective: Filed Vitals:   12/27/12 1941 12/27/12 2258 12/28/12 0355 12/28/12 0458  BP: 117/64 161/62 167/66   Pulse: 87  77   Temp: 97.5 F (36.4 C) 98.3 F (36.8 C) 98.8 F (37.1 C)   TempSrc: Oral Oral Oral   Resp: 16 20 20    Height:      Weight:    64.411 kg (142 lb)  SpO2:   95%     Intake/Output Summary (Last 24 hours) at 12/28/12 1036 Last data filed at 12/28/12 0850  Gross per 24 hour  Intake    360 ml  Output   2209 ml  Net  -1849 ml     Filed Weights   12/27/12 1500 12/27/12 1846 12/28/12 0458  Weight: 63.4 kg (139 lb 12.4 oz) 61.2 kg (134 lb 14.7 oz) 64.411 kg (142 lb)    Exam:   Afebrile, vital signs stable. Oxygenation 95% on room air.  General: Appears calm and comfortable. Sitting on the side of the bed.  Cardiovascular: Regular rate and rhythm. No murmur, rub, gallop. No significant lower extremity edema.  Respiratory: Clear to auscultation bilaterally. No wheezes, rales or rhonchi. Normal respiratory effort.  Psychiatric: Grossly normal and affect. Speech fluent and appropriate.   Exam current 10/7  Data Reviewed:  Basic metabolic panel consistent with end-stage renal disease  CT chest mild interstitial edema. Moderate left pleural effusion.   Scheduled Meds: . DULoxetine  60 mg Oral Daily  . folic acid  1 mg Oral Daily  . gabapentin  100 mg Oral TID  . heparin  5,000 Units Subcutaneous Q8H  . hydrALAZINE  25 mg Oral TID  . isosorbide mononitrate  30 mg Oral Daily  . multivitamin  1 tablet Oral Daily  . pantoprazole  40 mg Oral  Daily  . sodium chloride  3 mL Intravenous Q12H   Continuous Infusions:   Principal Problem:   Pulmonary edema Active Problems:   Pleural effusion   ESRD on hemodialysis   Acute diastolic heart failure   Volume  overload

## 2012-12-30 LAB — CULTURE, BLOOD (ROUTINE X 2): Culture: NO GROWTH

## 2013-01-03 ENCOUNTER — Other Ambulatory Visit: Payer: Self-pay | Admitting: Nurse Practitioner

## 2013-01-03 DIAGNOSIS — G4734 Idiopathic sleep related nonobstructive alveolar hypoventilation: Secondary | ICD-10-CM

## 2013-01-20 ENCOUNTER — Ambulatory Visit: Payer: Medicare Other | Admitting: Family Medicine

## 2013-01-20 ENCOUNTER — Ambulatory Visit (INDEPENDENT_AMBULATORY_CARE_PROVIDER_SITE_OTHER): Payer: Medicare Other

## 2013-01-20 ENCOUNTER — Encounter: Payer: Self-pay | Admitting: Family Medicine

## 2013-01-20 ENCOUNTER — Ambulatory Visit (INDEPENDENT_AMBULATORY_CARE_PROVIDER_SITE_OTHER): Payer: Medicare Other | Admitting: Family Medicine

## 2013-01-20 VITALS — BP 132/68 | HR 77 | Temp 97.9°F | Ht 66.0 in | Wt 144.0 lb

## 2013-01-20 DIAGNOSIS — J441 Chronic obstructive pulmonary disease with (acute) exacerbation: Secondary | ICD-10-CM

## 2013-01-20 DIAGNOSIS — N186 End stage renal disease: Secondary | ICD-10-CM

## 2013-01-20 MED ORDER — SODIUM CHLORIDE 0.9 % IV SOLN: 125.0000 mg | Freq: Once | INTRAVENOUS | Status: DC

## 2013-01-20 MED ORDER — LEVOFLOXACIN 250 MG PO TABS: ORAL_TABLET | ORAL | Status: DC

## 2013-01-20 MED ORDER — PREDNISONE 50 MG PO TABS: ORAL_TABLET | ORAL | Status: DC

## 2013-01-20 MED ORDER — METHYLPREDNISOLONE SODIUM SUCC 125 MG IJ SOLR
125.0000 mg | Freq: Once | INTRAMUSCULAR | Status: AC
  Administered 2013-01-20: 125 mg via INTRAMUSCULAR

## 2013-01-20 NOTE — Progress Notes (Signed)
Tolerated solu medrol injection with out difficulty

## 2013-01-20 NOTE — Addendum Note (Signed)
Addended by: Pura Spice on: 01/20/2013 02:15 PM   Modules accepted: Orders

## 2013-01-20 NOTE — Progress Notes (Signed)
  Subjective:    Patient ID: [name], male    DOB: 04-Feb-1951, 62 y.o.   MRN: [id number]  HPI Patient presents today with chief complaint of bronchitic symptoms. Patient reports a persistent cough, increased sputum production as well as wheezing. Patient is noted to have been admitted in the hospital on October 4 for acute respiratory failure secondary to CHF exacerbation and volume overload in the setting of end-stage renal disease. Hemodialysis on Monday Wednesdays and Fridays. The patient was diuresed at that time with resolution of respiratory status. Since this point, patient reports that he has had persistent cough as well as wheezing. Baseline 55+-pack-year smoking history. No fevers or chills. Minimal dyspnea. Significant wheezing and cough. No Lyme overload. Patient states he's been at or under his dry weight since hospital discharge.  Review of Systems  All other systems reviewed and are negative.       Objective:   Physical Exam  Constitutional:  Frail, appears older than stated age  HENT:  Head: Normocephalic and atraumatic.  Eyes: Conjunctivae are normal. Pupils are equal, round, and reactive to light.  Neck: Normal range of motion.  Cardiovascular: Normal rate and regular rhythm.   Pulmonary/Chest: Effort normal. He has wheezes.  Abdominal: Soft.  Musculoskeletal: Normal range of motion.  Neurological: He is alert.  Skin: Skin is warm.          Assessment & Plan:  COPD exacerbation - Plan: methylPREDNISolone sodium succinate (SOLU-MEDROL) 130 mg in sodium chloride 0.9 % 50 mL IVPB, predniSONE (DELTASONE) 50 MG tablet, levofloxacin (LEVAQUIN) 250 MG tablet, DG Chest 2 View  ESRD on dialysis - Plan: levofloxacin (LEVAQUIN) 250 MG tablet  Clinically with COPD exacerbation. Solu-Medrol 125 mg IM x1. We'll place on extended course of prednisone. We'll place on Levaquin at renal dosing given recent hospitalization. Should give adequate HCAP coverage.   Chest x-ray pending. Overall stable respiratory status with no increased work of breathing or dyspnea. Discuss infectious and respiratory red flags at length with patient and daughter. Also discussed smoking cessation. Follow up as needed.

## 2013-01-20 NOTE — Patient Instructions (Signed)
Methylprednisolone Solution for Injection What is this medicine? METHYLPREDNISOLONE (meth ill pred NISS oh lone) is a corticosteroid. It is commonly used to treat inflammation of the skin, joints, lungs, and other organs. Common conditions treated include asthma, allergies, and arthritis. It is also used for other conditions, such as blood disorders and diseases of the adrenal glands. This medicine may be used for other purposes; ask your health care provider or pharmacist if you have questions. What should I tell my health care provider before I take this medicine? They need to know if you have any of these conditions: -cataracts or glaucoma -Cushing's syndrome -heart disease -high blood pressure -infection including tuberculosis -low calcium or potassium levels in the blood -recent surgery -seizures -stomach or intestinal disease, including colitis -threadworms -thyroid problems -an unusual or allergic reaction to methylprednisolone, corticosteroids, benzyl alcohol, other medicines, foods, dyes, or preservatives -pregnant or trying to get pregnant -breast-feeding How should I use this medicine? This medicine is for injection or infusion into a vein. It is also for injection into a muscle. It is given by a health care professional in a hospital or clinic setting. Talk to your pediatrician regarding the use of this medicine in children. While this drug may be prescribed for selected conditions, precautions do apply. Overdosage: If you think you have taken too much of this medicine contact a poison control center or emergency room at once. NOTE: This medicine is only for you. Do not share this medicine with others. What if I miss a dose? This does not apply. What may interact with this medicine? Do not take this medicine with any of the following medications: -mifepristone -radiopaque contrast agents This medicine may also interact with the following medications: -aspirin and  aspirin-like medicines -cyclosporin -ketoconazole -phenobarbital -phenytoin -rifampin -tacrolimus -troleandomycin -vaccines -warfarin This list may not describe all possible interactions. Give your health care provider a list of all the medicines, herbs, non-prescription drugs, or dietary supplements you use. Also tell them if you smoke, drink alcohol, or use illegal drugs. Some items may interact with your medicine. What should I watch for while using this medicine? Visit your doctor or health care professional for regular checks on your progress. If you are taking this medicine for a long time, carry an identification card with your name and address, the type and dose of your medicine, and your doctor's name and address. The medicine may increase your risk of getting an infection. Stay away from people who are sick. Tell your doctor or health care professional if you are around anyone with measles or chickenpox. You may need to avoid some vaccines. Talk to your health care provider for more information. If you are going to have surgery, tell your doctor or health care professional that you have taken this medicine within the last twelve months. Ask your doctor or health care professional about your diet. You may need to lower the amount of salt you eat. The medicine can increase your blood sugar. If you are a diabetic check with your doctor if you need help adjusting the dose of your diabetic medicine. What side effects may I notice from receiving this medicine? Side effects that you should report to your doctor or health care professional as soon as possible: -allergic reactions like skin rash, itching or hives, swelling of the face, lips, or tongue -bloody or tarry stools -changes in vision -eye pain or bulging eyes -fever, sore throat, sneezing, cough, or other signs of infection, wounds that will not heal -  increased thirst -irregular heartbeat -muscle cramps -pain in hips, back,  ribs, arms, shoulders, or legs -swelling of the ankles, feet, hands -trouble passing urine or change in the amount of urine -unusual bleeding or bruising -unusually weak or tired -weight gain or weight loss Side effects that usually do not require medical attention (report to your doctor or health care professional if they continue or are bothersome): -changes in emotions or moods -constipation or diarrhea -headache -irritation at site where injected -nausea, vomiting -skin problems, acne, thin and shiny skin -trouble sleeping -unusual hair growth on the face or body This list may not describe all possible side effects. Call your doctor for medical advice about side effects. You may report side effects to FDA at 1-800-FDA-1088. Where should I keep my medicine? This drug is given in a hospital or clinic and will not be stored at home. NOTE: This sheet is a summary. It may not cover all possible information. If you have questions about this medicine, talk to your doctor, pharmacist, or health care provider.  2013, [employer]. (09/27/2007 1:56:07 PM)  

## 2013-01-21 ENCOUNTER — Other Ambulatory Visit: Payer: Self-pay | Admitting: General Practice

## 2013-01-21 ENCOUNTER — Ambulatory Visit: Payer: Medicare Other | Admitting: Family Medicine

## 2013-01-21 DIAGNOSIS — G4700 Insomnia, unspecified: Secondary | ICD-10-CM

## 2013-01-21 MED ORDER — ALPRAZOLAM 1 MG PO TABS: 1.0000 mg | ORAL_TABLET | Freq: Every day | ORAL | Status: DC

## 2013-01-21 NOTE — Telephone Encounter (Signed)
Rx ready for nurse to Phone in. 

## 2013-01-21 NOTE — Telephone Encounter (Signed)
[facility] called [facility]  for alprazolam  pt aware

## 2013-01-21 NOTE — Telephone Encounter (Signed)
Last filled 11/25/12 with 1 RF, seen by

## 2013-01-21 NOTE — Telephone Encounter (Signed)
Last filled 11/25/12 with 1 RF, last seen 01/20/13. If approved route to pool A, so it can be called into [facility]

## 2013-01-26 ENCOUNTER — Other Ambulatory Visit: Payer: Self-pay | Admitting: Nurse Practitioner

## 2013-01-26 DIAGNOSIS — G4734 Idiopathic sleep related nonobstructive alveolar hypoventilation: Secondary | ICD-10-CM

## 2013-02-01 ENCOUNTER — Ambulatory Visit (INDEPENDENT_AMBULATORY_CARE_PROVIDER_SITE_OTHER): Payer: Medicare Other

## 2013-02-01 ENCOUNTER — Telehealth: Payer: Self-pay | Admitting: General Practice

## 2013-02-01 ENCOUNTER — Telehealth: Payer: Self-pay | Admitting: *Deleted

## 2013-02-01 ENCOUNTER — Ambulatory Visit (INDEPENDENT_AMBULATORY_CARE_PROVIDER_SITE_OTHER): Payer: Medicare Other | Admitting: General Practice

## 2013-02-01 VITALS — BP 147/44 | HR 86 | Temp 97.0°F | Wt 147.0 lb

## 2013-02-01 DIAGNOSIS — R0602 Shortness of breath: Secondary | ICD-10-CM

## 2013-02-01 DIAGNOSIS — R0989 Other specified symptoms and signs involving the circulatory and respiratory systems: Secondary | ICD-10-CM

## 2013-02-01 DIAGNOSIS — G629 Polyneuropathy, unspecified: Secondary | ICD-10-CM

## 2013-02-01 DIAGNOSIS — F411 Generalized anxiety disorder: Secondary | ICD-10-CM

## 2013-02-01 DIAGNOSIS — G4700 Insomnia, unspecified: Secondary | ICD-10-CM

## 2013-02-01 DIAGNOSIS — R05 Cough: Secondary | ICD-10-CM

## 2013-02-01 DIAGNOSIS — J90 Pleural effusion, not elsewhere classified: Secondary | ICD-10-CM

## 2013-02-01 DIAGNOSIS — G589 Mononeuropathy, unspecified: Secondary | ICD-10-CM

## 2013-02-01 MED ORDER — ALPRAZOLAM 1 MG PO TABS: 1.0000 mg | ORAL_TABLET | Freq: Two times a day (BID) | ORAL | Status: DC | PRN

## 2013-02-01 MED ORDER — ALPRAZOLAM 1 MG PO TABS: 1.0000 mg | ORAL_TABLET | Freq: Every day | ORAL | Status: DC

## 2013-02-01 MED ORDER — CLARITHROMYCIN 250 MG PO TABS: 250.0000 mg | ORAL_TABLET | Freq: Two times a day (BID) | ORAL | Status: DC

## 2013-02-01 MED ORDER — AZITHROMYCIN 250 MG PO TABS: ORAL_TABLET | ORAL | Status: DC

## 2013-02-01 NOTE — Telephone Encounter (Signed)
Xanax 1mg  , qty 60 , take bid, called to [facility] vm per provider.

## 2013-02-01 NOTE — Patient Instructions (Signed)
Pleural Effusion  The lining covering your lungs and the inside of your chest is called the pleura. Usually, the space between the 2 pleura contains no air and only a thin layer of fluid. A pleural effusion is an abnormal buildup of fluid in the pleural space.  Fluid gathers when there is increased pressure in the lung vessels. This forces fluids out of the lungs and into the pleural space. Vessels may also leak fluids when there are infections, such as pneumonia, or other causes of soreness and redness (inflammation). Fluids leak into the lungs when protein in the blood is low or when certain vessels (lymphatics) are blocked.  Finding a pleural effusion is important because it is usually caused by another disease. In order to treat a pleural effusion, your caregiver needs to find its cause. If left untreated, a large amount of fluid can build up and cause collapse of the lung.  CAUSES    Heart failure.   Infections (pneumonia, tuberculosis), pulmonary embolism, pulmonary infarction.   Cancer (primary lung and metastatic), asbestosis.   Liver failure (cirrhosis).   Nephrotic syndrome, peritoneal dialysis, kidney problems (uremia).   Collagen vascular disease (systemic lupus erythematosis, rheumatoid arthritis).   Injury (trauma) to the chest or rupture of the digestive tube (esophagus).   Material in the chest or pleural space (hemothorax, chylothorax).   Pancreatitis.   Surgery.   Drug reactions.  SYMPTOMS   A pleural effusion can decrease the amount of space available for breathing and make you short of breath. The fluid can become infected, which may cause pain and fever. Often, the pain is worse when taking a deep breath. The underlying disease (heart failure, pneumonia, blood clot, tuberculosis, cancer) may also cause symptoms.  DIAGNOSIS    Your caregiver can usually tell what is wrong by talking to you (taking a history), doing an exam, and taking a routine X-ray. If the X-ray shows fluid in your  chest, often fluid is removed from your chest with a needle for testing (diagnostic thoracentesis).   Sometimes, more specialized X-rays may be needed.   Sometimes, a small piece of tissue is removed and examined by a specialist (biopsy).  TREATMENT   Treatment varies based on what caused the pleural effusion. Treatments include:   Removing as much fluid as possible using a needle (thoracentesis) to improve the cough and shortness of breath. This is a simple procedure which can be done at bedside. The risks are bleeding, infection, collapse of a lung, or low blood pressure.   Placing a tube in the chest to drain the effusion (tube thoracostomy). This is often used when there is an infection in the fluid. This is a simple procedure which can often be done at bedside or in a clinic. The procedure may be painful. The risks are the same as using a needle to drain the fluid. The chest tube usually remains for a few days and is connected to suction to improve fluid drainage. The tube, after placement, usually does not cause much discomfort.   Surgical removal of fibrous debris in and around the pleural space (decortication). This may be done with a flexible telescope (thoracoscope) through a small or large cut (incision). This is helpful for patients who have fibrosis or scar tissue that prevents complete lung expansion. The risks are infection, blood loss, and side effects from general anesthesia.   Sometimes, a procedure called pleurodesis is done. A chest tube is placed and the fluid is drained. Next, an agent (  tetracycline, talc powder) is added to the pleural space. This causes the lung and chest wall to stick together (adhesion). This leaves no potential space for fluid to build up. The risks include infection, blood loss, and side effects from general anesthesia.   If the effusion is caused by infection, it may be treated with antibiotics and improve without draining.  HOME CARE INSTRUCTIONS    Take any  medicines exactly as prescribed.   Follow up with your caregiver as directed.   Monitor your exercise capacity (the amount of walking you can do before you get short of breath).   Do not smoke. Ask your caregiver for help quitting.  SEEK MEDICAL CARE IF:    Your exercise capacity seems to get worse or does not improve with time.   You do not recover from your illness.  SEEK IMMEDIATE MEDICAL CARE IF:    Shortness of breath or chest pain develops or gets worse.   You have an oral temperature above 102 F (38.9 C), not controlled by medicine.   You develop a new cough, especially if the mucus (phlegm) is discolored.  MAKE SURE YOU:    Understand these instructions.   Will watch your condition.   Will get help right away if you are not doing well or get worse.  Document Released: 03/10/2005 Document Revised: 06/02/2011 Document Reviewed: 10/30/2006  ExitCare Patient Information 2014 [employer].

## 2013-02-01 NOTE — Telephone Encounter (Signed)
Spoke with insurance company, wouldn't cover zpak. Biaxin script sent to pharmacy instead. Patient notified and verbalized understanding

## 2013-02-01 NOTE — Progress Notes (Addendum)
  Subjective:    Patient ID: [name], male    DOB: Nov 05, 1950, 62 y.o.   MRN: [id number]  HPI Patient presents today to for chronic health follow up. He has a history of ESRD (hemodialysis on M-W-F), hypertension, PVD, and chronic pain. Patient reports having a cough and shortness of breath that has been present for about two months. He was admitted into hospital on 10/4 for fluid overload and treated on 11/4 by this office. He reports some improvement with treatment and symptoms worsened. Patient seems non compliant with renal diet and fluid restrictions. Reports smoking only 1 cigarette daily.  Also reports feeling more anxious at times and inability to sleep.    Review of Systems  Constitutional: Negative for fever and chills.  Respiratory: Positive for cough and shortness of breath. Negative for chest tightness.   Cardiovascular: Negative for chest pain and palpitations.  Neurological: Negative for dizziness, weakness and headaches.  Psychiatric/Behavioral: Positive for sleep disturbance. Negative for suicidal ideas. The patient is nervous/anxious.        Objective:   Physical Exam  Constitutional: He is oriented to person, place, and time. He appears well-developed and well-nourished.  Cardiovascular: Normal rate, regular rhythm and normal heart sounds.   Pulmonary/Chest: No respiratory distress. He has decreased breath sounds in the left upper field. He has no wheezes. He has no rhonchi. He has no rales. He exhibits no tenderness.  Decreased breath sounds throughout Dyspnea on exertion  Abdominal: Soft. Bowel sounds are normal. He exhibits no distension. There is no tenderness.  Neurological: He is alert and oriented to person, place, and time.  Skin: Skin is warm and dry.  Psychiatric: He has a normal mood and affect.    [facility] reading (PRIMARY) by Coralie Keens, FNP-C, no change in left worse than right pleural effusions. No new  abnormality.           Assessment & Plan:  1. Cough, 2. Shortness of breath  - DG Chest 2 View; Future  3. Insomnia, 4. Generalized anxiety disorder  - ALPRAZolam (XANAX) 1 MG tablet; Take 1 tablet (1 mg total) by mouth 2 (two) times daily as needed for anxiety or sleep.  Dispense: 60 tablet; Refill: 0  5. Neuropathy   6. Pleural effusion   7. Chest congestion  - clarithromycin (BIAXIN) 250 MG tablet; Take 1 tablet (250 mg total) by mouth 2 (two) times daily.  Dispense: 14 tablet; Refill: 0  8. ESRD -spoke with  Dr. Rhodia Albright who manages ESRD for patient and updated on patient condition -discussed with patient about referral to thoracic surgeon, to discuss thoracentesis, and possible ways to prevent reoccurrence -patient declined referral at this time, he would like to see if medication and time improves symptoms -discussed importance of smoking cessation, adhering to renal diet and fluid restrictions -seek emergency medical treatment if symptoms worsen -Patient verbalized understanding Coralie Keens, FNP-C

## 2013-02-02 ENCOUNTER — Telehealth: Payer: Self-pay | Admitting: General Practice

## 2013-02-02 NOTE — Telephone Encounter (Signed)
Advise

## 2013-02-02 NOTE — Telephone Encounter (Signed)
Attempted to contact patient with out success. 

## 2013-02-03 ENCOUNTER — Other Ambulatory Visit: Payer: Self-pay | Admitting: General Practice

## 2013-02-03 DIAGNOSIS — J90 Pleural effusion, not elsewhere classified: Secondary | ICD-10-CM

## 2013-02-08 ENCOUNTER — Encounter (HOSPITAL_COMMUNITY): Payer: Self-pay | Admitting: Emergency Medicine

## 2013-02-08 ENCOUNTER — Inpatient Hospital Stay (HOSPITAL_COMMUNITY)
Admission: EM | Admit: 2013-02-08 | Discharge: 2013-02-14 | DRG: 673 | Disposition: A | Discharge status: Home / Self Care | Payer: Medicare Other | Attending: Internal Medicine | Admitting: Internal Medicine

## 2013-02-08 ENCOUNTER — Inpatient Hospital Stay (HOSPITAL_COMMUNITY): Payer: Medicare Other

## 2013-02-08 ENCOUNTER — Emergency Department (HOSPITAL_COMMUNITY): Payer: Medicare Other

## 2013-02-08 ENCOUNTER — Encounter: Payer: Medicare Other | Admitting: Cardiothoracic Surgery

## 2013-02-08 DIAGNOSIS — S88119A Complete traumatic amputation at level between knee and ankle, unspecified lower leg, initial encounter: Secondary | ICD-10-CM

## 2013-02-08 DIAGNOSIS — J189 Pneumonia, unspecified organism: Secondary | ICD-10-CM

## 2013-02-08 DIAGNOSIS — E119 Type 2 diabetes mellitus without complications: Secondary | ICD-10-CM | POA: Diagnosis present

## 2013-02-08 DIAGNOSIS — D631 Anemia in chronic kidney disease: Secondary | ICD-10-CM

## 2013-02-08 DIAGNOSIS — I120 Hypertensive chronic kidney disease with stage 5 chronic kidney disease or end stage renal disease: Principal | ICD-10-CM | POA: Diagnosis present

## 2013-02-08 DIAGNOSIS — Z48812 Encounter for surgical aftercare following surgery on the circulatory system: Secondary | ICD-10-CM

## 2013-02-08 DIAGNOSIS — I509 Heart failure, unspecified: Secondary | ICD-10-CM | POA: Diagnosis present

## 2013-02-08 DIAGNOSIS — IMO0002 Reserved for concepts with insufficient information to code with codable children: Secondary | ICD-10-CM

## 2013-02-08 DIAGNOSIS — Z8711 Personal history of peptic ulcer disease: Secondary | ICD-10-CM

## 2013-02-08 DIAGNOSIS — M129 Arthropathy, unspecified: Secondary | ICD-10-CM | POA: Diagnosis present

## 2013-02-08 DIAGNOSIS — K219 Gastro-esophageal reflux disease without esophagitis: Secondary | ICD-10-CM | POA: Diagnosis present

## 2013-02-08 DIAGNOSIS — Y9210 Unspecified residential institution as the place of occurrence of the external cause: Secondary | ICD-10-CM | POA: Diagnosis present

## 2013-02-08 DIAGNOSIS — I5033 Acute on chronic diastolic (congestive) heart failure: Secondary | ICD-10-CM | POA: Diagnosis present

## 2013-02-08 DIAGNOSIS — I2510 Atherosclerotic heart disease of native coronary artery without angina pectoris: Secondary | ICD-10-CM

## 2013-02-08 DIAGNOSIS — T82898A Other specified complication of vascular prosthetic devices, implants and grafts, initial encounter: Secondary | ICD-10-CM | POA: Diagnosis present

## 2013-02-08 DIAGNOSIS — I4891 Unspecified atrial fibrillation: Secondary | ICD-10-CM

## 2013-02-08 DIAGNOSIS — N2581 Secondary hyperparathyroidism of renal origin: Secondary | ICD-10-CM | POA: Diagnosis present

## 2013-02-08 DIAGNOSIS — J4489 Other specified chronic obstructive pulmonary disease: Secondary | ICD-10-CM | POA: Diagnosis present

## 2013-02-08 DIAGNOSIS — E876 Hypokalemia: Secondary | ICD-10-CM | POA: Diagnosis present

## 2013-02-08 DIAGNOSIS — J9602 Acute respiratory failure with hypercapnia: Secondary | ICD-10-CM

## 2013-02-08 DIAGNOSIS — J690 Pneumonitis due to inhalation of food and vomit: Secondary | ICD-10-CM | POA: Diagnosis present

## 2013-02-08 DIAGNOSIS — N186 End stage renal disease: Secondary | ICD-10-CM

## 2013-02-08 DIAGNOSIS — J449 Chronic obstructive pulmonary disease, unspecified: Secondary | ICD-10-CM | POA: Diagnosis present

## 2013-02-08 DIAGNOSIS — J9690 Respiratory failure, unspecified, unspecified whether with hypoxia or hypercapnia: Secondary | ICD-10-CM

## 2013-02-08 DIAGNOSIS — F17200 Nicotine dependence, unspecified, uncomplicated: Secondary | ICD-10-CM | POA: Diagnosis present

## 2013-02-08 DIAGNOSIS — G9340 Encephalopathy, unspecified: Secondary | ICD-10-CM

## 2013-02-08 DIAGNOSIS — J9600 Acute respiratory failure, unspecified whether with hypoxia or hypercapnia: Secondary | ICD-10-CM | POA: Diagnosis present

## 2013-02-08 DIAGNOSIS — Z992 Dependence on renal dialysis: Secondary | ICD-10-CM

## 2013-02-08 DIAGNOSIS — J9601 Acute respiratory failure with hypoxia: Secondary | ICD-10-CM

## 2013-02-08 DIAGNOSIS — Z8673 Personal history of transient ischemic attack (TIA), and cerebral infarction without residual deficits: Secondary | ICD-10-CM

## 2013-02-08 DIAGNOSIS — J90 Pleural effusion, not elsewhere classified: Secondary | ICD-10-CM

## 2013-02-08 DIAGNOSIS — Y838 Other surgical procedures as the cause of abnormal reaction of the patient, or of later complication, without mention of misadventure at the time of the procedure: Secondary | ICD-10-CM | POA: Diagnosis present

## 2013-02-08 DIAGNOSIS — I5031 Acute diastolic (congestive) heart failure: Secondary | ICD-10-CM

## 2013-02-08 DIAGNOSIS — I739 Peripheral vascular disease, unspecified: Secondary | ICD-10-CM | POA: Diagnosis present

## 2013-02-08 DIAGNOSIS — E8770 Fluid overload, unspecified: Secondary | ICD-10-CM

## 2013-02-08 DIAGNOSIS — E785 Hyperlipidemia, unspecified: Secondary | ICD-10-CM | POA: Diagnosis present

## 2013-02-08 DIAGNOSIS — E43 Unspecified severe protein-calorie malnutrition: Secondary | ICD-10-CM | POA: Diagnosis present

## 2013-02-08 DIAGNOSIS — I953 Hypotension of hemodialysis: Secondary | ICD-10-CM | POA: Diagnosis present

## 2013-02-08 LAB — POCT I-STAT 3, ART BLOOD GAS (G3+)
O2 Saturation: 98 %
TCO2: 28 mmol/L (ref 0–100)
pCO2 arterial: 43 mmHg (ref 35.0–45.0)
pH, Arterial: 7.408 (ref 7.350–7.450)
pO2, Arterial: 99 mmHg (ref 80.0–100.0)

## 2013-02-08 LAB — CBC
HCT: 38 % — ABNORMAL LOW (ref 39.0–52.0)
MCH: 27.1 pg (ref 26.0–34.0)
MCV: 88.2 fL (ref 78.0–100.0)
Platelets: 167 10*3/uL (ref 150–400)
RBC: 4.31 MIL/uL (ref 4.22–5.81)
RDW: 22.4 % — ABNORMAL HIGH (ref 11.5–15.5)
WBC: 5.3 10*3/uL (ref 4.0–10.5)

## 2013-02-08 LAB — POCT I-STAT, CHEM 8
BUN: 31 mg/dL — ABNORMAL HIGH (ref 6–23)
Calcium, Ion: 1.16 mmol/L (ref 1.13–1.30)
Chloride: 101 mEq/L (ref 96–112)
Creatinine, Ser: 7.4 mg/dL — ABNORMAL HIGH (ref 0.50–1.35)
Potassium: 3 mEq/L — ABNORMAL LOW (ref 3.5–5.1)
Sodium: 141 mEq/L (ref 135–145)
TCO2: 27 mmol/L (ref 0–100)

## 2013-02-08 LAB — GLUCOSE, CAPILLARY: Glucose-Capillary: 122 mg/dL — ABNORMAL HIGH (ref 70–99)

## 2013-02-08 LAB — TROPONIN I: Troponin I: <0.3 ng/mL (ref <0.30)

## 2013-02-08 LAB — CBC WITH DIFFERENTIAL/PLATELET
Basophils Absolute: 0.1 10*3/uL (ref 0.0–0.1)
Basophils Relative: 1 % (ref 0–1)
Eosinophils Absolute: 0.2 10*3/uL (ref 0.0–0.7)
Eosinophils Relative: 4 % (ref 0–5)
HCT: 33.7 % — ABNORMAL LOW (ref 39.0–52.0)
Lymphocytes Relative: 23 % (ref 12–46)
Lymphs Abs: 1.3 10*3/uL (ref 0.7–4.0)
MCHC: 30.3 g/dL (ref 30.0–36.0)
MCV: 88.7 fL (ref 78.0–100.0)
Monocytes Relative: 4 % (ref 3–12)
Neutrophils Relative %: 68 % (ref 43–77)
Platelets: 194 10*3/uL (ref 150–400)
RDW: 22.2 % — ABNORMAL HIGH (ref 11.5–15.5)
WBC: 5.8 10*3/uL (ref 4.0–10.5)

## 2013-02-08 LAB — COMPREHENSIVE METABOLIC PANEL
ALT: 8 U/L (ref 0–53)
Albumin: 2.8 g/dL — ABNORMAL LOW (ref 3.5–5.2)
Alkaline Phosphatase: 85 U/L (ref 39–117)
BUN: 32 mg/dL — ABNORMAL HIGH (ref 6–23)
Calcium: 9.6 mg/dL (ref 8.4–10.5)
Chloride: 98 mEq/L (ref 96–112)
GFR calc Af Amer: 8 mL/min — ABNORMAL LOW (ref >90)
Glucose, Bld: 94 mg/dL (ref 70–99)
Potassium: 3.4 mEq/L — ABNORMAL LOW (ref 3.5–5.1)
Sodium: 139 mEq/L (ref 135–145)
Total Protein: 7.7 g/dL (ref 6.0–8.3)

## 2013-02-08 LAB — POCT I-STAT TROPONIN I: Troponin i, poc: 0.02 ng/mL (ref 0.00–0.08)

## 2013-02-08 LAB — MRSA PCR SCREENING: MRSA by PCR: NEGATIVE

## 2013-02-08 LAB — CG4 I-STAT (LACTIC ACID): Lactic Acid, Venous: 0.76 mmol/L (ref 0.5–2.2)

## 2013-02-08 LAB — PRO B NATRIURETIC PEPTIDE: Pro B Natriuretic peptide (BNP): 27711 pg/mL — ABNORMAL HIGH (ref 0–125)

## 2013-02-08 LAB — CREATININE, SERUM: GFR calc Af Amer: 9 mL/min — ABNORMAL LOW (ref >90)

## 2013-02-08 MED ORDER — HEPARIN SODIUM (PORCINE) 1000 UNIT/ML DIALYSIS
1000.0000 [IU] | INTRAMUSCULAR | Status: DC | PRN
  Filled 2013-02-08: qty 1

## 2013-02-08 MED ORDER — NOREPINEPHRINE BITARTRATE 1 MG/ML IJ SOLN
2.0000 ug/min | INTRAVENOUS | Status: DC
  Administered 2013-02-08: 5 ug/min via INTRAVENOUS
  Filled 2013-02-08: qty 8

## 2013-02-08 MED ORDER — LIDOCAINE-PRILOCAINE 2.5-2.5 % EX CREA: 1.0000 "application " | TOPICAL_CREAM | CUTANEOUS | Status: DC | PRN

## 2013-02-08 MED ORDER — SODIUM CHLORIDE 0.9 % IV SOLN: 100.0000 mL | INTRAVENOUS | Status: DC | PRN

## 2013-02-08 MED ORDER — FENTANYL BOLUS VIA INFUSION
50.0000 ug | INTRAVENOUS | Status: DC | PRN
  Administered 2013-02-08: 50 ug via INTRAVENOUS
  Administered 2013-02-10: 25 ug via INTRAVENOUS
  Filled 2013-02-08: qty 100

## 2013-02-08 MED ORDER — HYDROCORTISONE SOD SUCCINATE 100 MG IJ SOLR
50.0000 mg | Freq: Four times a day (QID) | INTRAMUSCULAR | Status: DC
  Administered 2013-02-09 – 2013-02-10 (×6): 50 mg via INTRAVENOUS
  Administered 2013-02-10: 23:00:00 via INTRAVENOUS
  Administered 2013-02-10: 50 mg via INTRAVENOUS
  Filled 2013-02-08 (×16): qty 1

## 2013-02-08 MED ORDER — MIDAZOLAM HCL 2 MG/2ML IJ SOLN
INTRAMUSCULAR | Status: AC
  Filled 2013-02-08: qty 4

## 2013-02-08 MED ORDER — ASPIRIN 81 MG PO CHEW: 324.0000 mg | CHEWABLE_TABLET | ORAL | Status: AC

## 2013-02-08 MED ORDER — SODIUM CHLORIDE 0.9 % IV SOLN: 250.0000 mL | INTRAVENOUS | Status: DC | PRN

## 2013-02-08 MED ORDER — NALOXONE HCL 0.4 MG/ML IJ SOLN
INTRAMUSCULAR | Status: AC
  Administered 2013-02-08: 0.4 mg
  Filled 2013-02-08: qty 1

## 2013-02-08 MED ORDER — PANTOPRAZOLE SODIUM 40 MG IV SOLR
40.0000 mg | INTRAVENOUS | Status: DC
  Administered 2013-02-08 – 2013-02-09 (×2): 40 mg via INTRAVENOUS
  Filled 2013-02-08 (×3): qty 40

## 2013-02-08 MED ORDER — FENTANYL CITRATE 0.05 MG/ML IJ SOLN
INTRAMUSCULAR | Status: DC | PRN
  Administered 2013-02-08: 100 ug via INTRAVENOUS

## 2013-02-08 MED ORDER — INSULIN ASPART 100 UNIT/ML ~~LOC~~ SOLN
0.0000 [IU] | SUBCUTANEOUS | Status: DC
  Administered 2013-02-08 – 2013-02-09 (×2): 1 [IU] via SUBCUTANEOUS

## 2013-02-08 MED ORDER — FENTANYL CITRATE 0.05 MG/ML IJ SOLN: 50.0000 ug | Freq: Once | INTRAMUSCULAR | Status: DC

## 2013-02-08 MED ORDER — NEPRO/CARBSTEADY PO LIQD: 237.0000 mL | ORAL | Status: DC | PRN

## 2013-02-08 MED ORDER — POTASSIUM CHLORIDE 10 MEQ/100ML IV SOLN
10.0000 meq | INTRAVENOUS | Status: AC
  Administered 2013-02-08 (×2): 10 meq via INTRAVENOUS
  Filled 2013-02-08: qty 100

## 2013-02-08 MED ORDER — PENTAFLUOROPROP-TETRAFLUOROETH EX AERO: 1.0000 "application " | INHALATION_SPRAY | CUTANEOUS | Status: DC | PRN

## 2013-02-08 MED ORDER — FOLIC ACID 1 MG PO TABS
1.0000 mg | ORAL_TABLET | Freq: Every day | ORAL | Status: DC
  Filled 2013-02-08: qty 1

## 2013-02-08 MED ORDER — HEPARIN SODIUM (PORCINE) 1000 UNIT/ML DIALYSIS
20.0000 [IU]/kg | INTRAMUSCULAR | Status: DC | PRN
  Administered 2013-02-09: 1400 [IU] via INTRAVENOUS_CENTRAL
  Filled 2013-02-08: qty 2

## 2013-02-08 MED ORDER — MIDAZOLAM HCL 2 MG/2ML IJ SOLN
4.0000 mg | Freq: Once | INTRAMUSCULAR | Status: AC
  Administered 2013-02-08: 4 mg via INTRAVENOUS

## 2013-02-08 MED ORDER — VANCOMYCIN HCL 10 G IV SOLR
1500.0000 mg | Freq: Once | INTRAVENOUS | Status: AC
  Administered 2013-02-08: 1500 mg via INTRAVENOUS
  Filled 2013-02-08: qty 1500

## 2013-02-08 MED ORDER — MIDAZOLAM HCL 2 MG/2ML IJ SOLN
INTRAMUSCULAR | Status: DC | PRN
  Administered 2013-02-08: 1 mg via INTRAVENOUS

## 2013-02-08 MED ORDER — ALTEPLASE 2 MG IJ SOLR
2.0000 mg | Freq: Once | INTRAMUSCULAR | Status: AC | PRN
  Filled 2013-02-08: qty 2

## 2013-02-08 MED ORDER — ASPIRIN 300 MG RE SUPP
300.0000 mg | RECTAL | Status: AC
  Filled 2013-02-08: qty 1

## 2013-02-08 MED ORDER — SODIUM CHLORIDE 0.9 % IV SOLN
0.0000 ug/h | INTRAVENOUS | Status: DC
  Administered 2013-02-08: 5 ug/h via INTRAVENOUS
  Administered 2013-02-08 (×3): 100 ug/h via INTRAVENOUS
  Filled 2013-02-08: qty 50

## 2013-02-08 MED ORDER — ETOMIDATE 2 MG/ML IV SOLN
INTRAVENOUS | Status: DC | PRN
  Administered 2013-02-08: 15 mg via INTRAVENOUS
  Administered 2013-02-08: 20 mg via INTRAVENOUS
  Administered 2013-02-08 (×2): 10 mg via INTRAVENOUS

## 2013-02-08 MED ORDER — ETOMIDATE 2 MG/ML IV SOLN
INTRAVENOUS | Status: AC
  Administered 2013-02-08: 15 mg via INTRAVENOUS
  Filled 2013-02-08: qty 10

## 2013-02-08 MED ORDER — PIPERACILLIN-TAZOBACTAM IN DEX 2-0.25 GM/50ML IV SOLN
2.2500 g | Freq: Three times a day (TID) | INTRAVENOUS | Status: DC
  Administered 2013-02-08 – 2013-02-12 (×10): 2.25 g via INTRAVENOUS
  Filled 2013-02-08 (×15): qty 50

## 2013-02-08 MED ORDER — FENTANYL CITRATE 0.05 MG/ML IJ SOLN
INTRAMUSCULAR | Status: AC
  Filled 2013-02-08: qty 2

## 2013-02-08 MED ORDER — LIDOCAINE HCL (PF) 1 % IJ SOLN: 5.0000 mL | INTRAMUSCULAR | Status: DC | PRN

## 2013-02-08 MED ORDER — MIDAZOLAM HCL 2 MG/2ML IJ SOLN
INTRAMUSCULAR | Status: AC
  Filled 2013-02-08: qty 2

## 2013-02-08 MED ORDER — GABAPENTIN 300 MG PO CAPS
300.0000 mg | ORAL_CAPSULE | Freq: Three times a day (TID) | ORAL | Status: DC
  Administered 2013-02-09 (×2): 300 mg via ORAL
  Filled 2013-02-08 (×4): qty 1

## 2013-02-08 MED ORDER — HEPARIN SODIUM (PORCINE) 5000 UNIT/ML IJ SOLN
5000.0000 [IU] | Freq: Three times a day (TID) | INTRAMUSCULAR | Status: DC
  Administered 2013-02-08 – 2013-02-11 (×8): 5000 [IU] via SUBCUTANEOUS
  Filled 2013-02-08 (×12): qty 1

## 2013-02-08 NOTE — ED Notes (Signed)
MD Finestein at bedside.

## 2013-02-08 NOTE — ED Notes (Signed)
ett placed by visiting med provider withcritical care, ventilating well. 7.5 tube

## 2013-02-08 NOTE — ED Notes (Signed)
MD Bary Richard at bedside, updating family on pt's current condition and pt's plan of care

## 2013-02-08 NOTE — Procedures (Signed)
Arterial Catheter Insertion Procedure Note [name] [id number] 1951/02/12  Procedure: Insertion of Arterial Catheter  Indications: Blood pressure monitoring and Frequent blood sampling  Procedure Details Consent: Unable to obtain consent because of emergent medical necessity. Time Out: Verified patient identification, verified procedure, site/side was marked, verified correct patient position, special equipment/implants available, medications/allergies/relevent history reviewed, required imaging and test results available.  Performed  Maximum sterile technique was used including antiseptics, cap, gloves, gown, hand hygiene, mask and sheet. Skin prep: Chlorhexidine; local anesthetic administered 2o gauge  catheter was inserted into left femoral artery using the Seldinger technique.  Evaluation Blood flow good; BP tracing good. Complications: No apparent complications.   [name] 02/08/2013  [place] gudiance Emergent No radial  [name]. Earl Alias, MD, FACP Pgr: [phone] [facility] \

## 2013-02-08 NOTE — Procedures (Signed)
Intubation Procedure Note [name] [id number] [birth year]  Procedure: Intubation Indications: Respiratory insufficiency  Procedure Details Consent: Unable to obtain consent because of emergent medical necessity. Time Out: Verified patient identification, verified procedure, site/side was marked, verified correct patient position, special equipment/implants available, medications/allergies/relevent history reviewed, required imaging and test results available.  Performed  Maximum sterile technique was used including gloves, gown, hand hygiene and mask.  MAC and 3    Evaluation Hemodynamic Status: BP stable throughout; O2 sats: stable throughout Patient's Current Condition: stable Complications: No apparent complications Patient did tolerate procedure well. Chest X-ray ordered to verify placement.  CXR: pending.   [name] 02/08/2013

## 2013-02-08 NOTE — ED Provider Notes (Signed)
CSN: [id number]     Arrival date & time 02/08/13  1356 History   First MD Initiated Contact with Patient 02/08/13 1405     Chief Complaint  Patient presents with  . Respiratory Distress   level V caveat due to altered mental status. (Consider location/radiation/quality/duration/timing/severity/associated sxs/prior Treatment) The history is provided by the patient and the EMS personnel.   patient was brought in for difficulty breathing and altered mental status. Has a history of CHF. Was reportedly seen at [facility] yesterday. Had been unresponsive.  Past Medical History  Diagnosis Date  . Hyperlipidemia   . Arthritis   . Claudication   . Anemia   . Chronic diastolic CHF (congestive heart failure) 07/2010    a. 07/2008 Echo EF 50-55%, mild-mod LVH, Gr 1 DD.  Marland Kitchen Peripheral vascular occlusive disease     a. 07/2011 Periph Angio: No signif Ao-illiac dzs, LCF stenosis, 100% LSFA w recon above knee pop and 3 vessel runoff, 100% RSFA w/ recon above knee --> pending L Fem to below Knee pop bypass.  Marland Kitchen History of tobacco abuse   . Hypertension     takes Amlodipine,Metoprolol,and Prinivil daily  . Dyspnea on exertion     with exertion  . Cataract     bilateral  . Dialysis patient   . PUD (peptic ulcer disease) mid 1990's    with GI bleed.  endoscoped in mid 1990's at [facility]  . Stroke 2005    no residual  . Asthma     08/2012  . GERD (gastroesophageal reflux disease)   . History of blood transfusion     peptic ulcer  . ESRD (end stage renal disease)     a. MWF dialysis in [place] (followed by Dr. Kristian Covey)   Past Surgical History  Procedure Laterality Date  . Arteriovenous graft placement    . Av fistula placement  12/10/2000    Right brachiocephalic arteriovenous   . Aortagram  07/29/2011    Abdominal Aortagram  . Cardiac catheterization  08/01/11    Left heart catheterization  . Femoral-popliteal bypass graft  09/09/2011    Procedure: BYPASS GRAFT FEMORAL-POPLITEAL  ARTERY;  Surgeon: [name], MD;  Location[facility] OR;  Service: Vascular;  Laterality: Left;  . Amputation  09/12/2011    Procedure: AMPUTATION BELOW KNEE;  Surgeon: [name], MD;  Location: [facility] OR;  Service: Vascular;  Laterality: Left;  TRANSMETATARSAL  . Application of wound vac  10/30/2011    Procedure: APPLICATION OF WOUND VAC;  Surgeon: [name], MD;  Location: [facility] OR;  Service: Vascular;  Laterality: Left;  . Groin debridement  11/01/2011    Procedure: Drucie Ip DEBRIDEMENT;  Surgeon: Chuck Hint, MD;  Location: [facility] OR;  Service: Vascular;  Laterality: Left;  . Esophagogastroduodenoscopy  11/06/2011    Procedure: ESOPHAGOGASTRODUODENOSCOPY (EGD);  Surgeon: Beverley Fiedler, MD;  Location: [facility] ENDOSCOPY;  Service: Gastroenterology;  Laterality: N/A;  . Femoral-popliteal bypass graft  01/02/2012    Procedure: BYPASS GRAFT FEMORAL-POPLITEAL ARTERY;  Surgeon: [name], MD;  Location: [facility] OR;  Service: Vascular;  Laterality: Right;  . Amputation  01/02/2012    Procedure: AMPUTATION RAY;  Surgeon: [name], MD;  Location: [facility] OR;  Service: Vascular;  Laterality: Right;  . Toe amputation Right     5/13- great toe  . Exchange of a dialysis catheter N/A 07/15/2012    Procedure: EXCHANGE OF A DIALYSIS CATHETER;  Surgeon: Fransisco Hertz, MD;  Location: [facility]  OR;  Service: Vascular;  Laterality: N/A;  . Av fistula placement Left 07/15/2012    Procedure: ARTERIOVENOUS (AV) FISTULA CREATION;  Surgeon: Fransisco Hertz, MD;  Location: [facility] OR;  Service: Vascular;  Laterality: Left;  . Av fistula placement Left 09/07/2012    Procedure: INSERTION OF ARTERIOVENOUS (AV) GORE-TEX GRAFT ARM;  Surgeon: Fransisco Hertz, MD;  Location: [facility] OR;  Service: Vascular;  Laterality: Left;  . Insertion of dialysis catheter Right 09/07/2012    Procedure: INSERTION OF a Femoral DIALYSIS CATHETER;  Surgeon: Fransisco Hertz, MD;  Location: [facility] OR;  Service: Vascular;  Laterality: Right;  . Lipoma excision Left 09/07/2012     Procedure: EXCISION Seroma left arm;  Surgeon: Fransisco Hertz, MD;  Location: [facility] OR;  Service: Vascular;  Laterality: Left;  . Hernia repair      umbilical  . Thrombectomy and revision of arterioventous (av) goretex  graft Left 11/30/2012    Procedure: THROMBECTOMY AND REVISION OF ARTERIOVENTOUS (AV) GORETEX  GRAFT;  Surgeon: Chuck Hint, MD;  Location: [facility] OR;  Service: Vascular;  Laterality: Left;   Family History  Problem Relation Age of Onset  . Breast cancer Mother   . Cancer Mother   . Cancer Father   . Anesthesia problems Neg Hx   . Hypotension Neg Hx   . Malignant hyperthermia Neg Hx   . Pseudochol deficiency Neg Hx    History  Substance Use Topics  . Smoking status: Current Every Day Smoker -- 0.10 packs/day for 40 years    Types: Cigarettes  . Smokeless tobacco: Never Used  . Alcohol Use: No    Review of Systems  Unable to perform ROS   Allergies  Ambien  Home Medications   Current Outpatient Rx  Name  Route  Sig  Dispense  Refill  . ALPRAZolam (XANAX) 1 MG tablet   Oral   Take 1 tablet (1 mg total) by mouth 2 (two) times daily as needed for anxiety or sleep.   60 tablet   0   . calcium acetate (PHOSLO) 667 MG capsule   Oral   Take 1,334-2,001 mg by mouth 3 (three) times daily with meals.          . cinacalcet (SENSIPAR) 60 MG tablet   Oral   Take 60 mg by mouth daily.         . clarithromycin (BIAXIN) 250 MG tablet   Oral   Take 1 tablet (250 mg total) by mouth 2 (two) times daily.   14 tablet   0   . DULoxetine (CYMBALTA) 60 MG capsule   Oral   Take 60 mg by mouth daily.         . folic acid (FOLVITE) 1 MG tablet   Oral   Take 1 mg by mouth daily.         Marland Kitchen gabapentin (NEURONTIN) 300 MG capsule   Oral   Take 1 capsule (300 mg total) by mouth 3 (three) times daily.   90 capsule   11   . hydrALAZINE (APRESOLINE) 25 MG tablet   Oral   Take 25 mg by mouth 3 (three) times daily.         . isosorbide mononitrate (IMDUR) 30  MG 24 hr tablet   Oral   Take 30 mg by mouth daily.         Marland Kitchen lanthanum (FOSRENOL) 1000 MG chewable tablet   Oral   Chew 2 tablets (2,000 mg total) by  mouth 3 (three) times daily with meals.   60 tablet   0   . multivitamin (RENA-VIT) TABS tablet   Oral   Take 1 tablet by mouth daily.         Marland Kitchen omeprazole (PRILOSEC) 20 MG capsule   Oral   Take 20 mg by mouth daily.         Marland Kitchen oxyCODONE-acetaminophen (PERCOCET) 7.5-325 MG per tablet   Oral   Take 1 tablet by mouth every 8 (eight) hours as needed for pain.   20 tablet   0   . predniSONE (DELTASONE) 50 MG tablet      1 tab daily x 7 days   7 tablet   0   . PROAIR HFA 108 (90 BASE) MCG/ACT inhaler   Inhalation   Inhale 1 puff into the lungs every 4 (four) hours as needed for wheezing.          Marland Kitchen tiZANidine (ZANAFLEX) 4 MG tablet   Oral   Take 4 mg by mouth every 6 (six) hours as needed.           BP 184/39  Pulse 79  Temp(Src) 97.7 F (36.5 C) (Axillary)  Resp 17  Ht 5\' 8"  (1.727 m)  Wt 152 lb 5 oz (69.088 kg)  BMI 23.16 kg/m2  SpO2 100% Physical Exam  Constitutional: He appears well-developed and well-nourished.  HENT:  JVD bilaterally. Nasal airway in place  Eyes:  Pupils mildly constricted  Neck:  JVD bilaterally  Cardiovascular: Normal rate and regular rhythm.   Pulmonary/Chest:  Diffuse rales. Wheezes on the left. Tachypnea.  Abdominal: There is no tenderness.  Musculoskeletal: He exhibits edema.  Mild pitting edema bilateral lower legs.  Neurological:  Will answer some questions. Somewhat somnolent  Skin: Skin is warm.    ED Course  Procedures (including critical care time) Labs Review Labs Reviewed  CBC WITH DIFFERENTIAL - Abnormal; Notable for the following:    RBC 3.80 (*)    Hemoglobin 10.2 (*)    HCT 33.7 (*)    RDW 22.2 (*)    All other components within normal limits  COMPREHENSIVE METABOLIC PANEL - Abnormal; Notable for the following:    Potassium 3.4 (*)    BUN 32 (*)     Creatinine, Ser 7.34 (*)    Albumin 2.8 (*)    GFR calc non Af Amer 7 (*)    GFR calc Af Amer 8 (*)    All other components within normal limits  POCT I-STAT, CHEM 8 - Abnormal; Notable for the following:    Potassium 3.0 (*)    BUN 31 (*)    Creatinine, Ser 7.40 (*)    Hemoglobin 11.6 (*)    HCT 34.0 (*)    All other components within normal limits  CG4 I-STAT (LACTIC ACID)  POCT I-STAT TROPONIN I   Imaging Review Dg Chest Port 1 View  02/08/2013   CLINICAL DATA:  Shortness of breath.  Unresponsive.  EXAM: PORTABLE CHEST - 1 VIEW  COMPARISON:  02/07/2013.  FINDINGS: Persistent prominent bilateral pleural effusions present. Interstitial prominence noted on today's exam suggesting possible component of congestive heart failure. Cardiomegaly. Stable vascular calcification noted. No pneumothorax.  IMPRESSION: 1. Persistent prominent bilateral pleural effusions. 2. New onset of pulmonary interstitial edema bilaterally suggesting a component of congestive heart failure.   Electronically Signed   By: Maisie Fus  Register   On: 02/08/2013 14:43    EKG Interpretation   None  CRITICAL CARE Performed by: Billee Cashing Total critical care time: 30 Critical care time was exclusive of separately billable procedures and treating other patients. Critical care was necessary to treat or prevent imminent or life-threatening deterioration. Critical care was time spent personally by me on the following activities: development of treatment plan with patient and/or surrogate as well as nursing, discussions with consultants, evaluation of patient's response to treatment, examination of patient, obtaining history from patient or surrogate, ordering and performing treatments and interventions, ordering and review of laboratory studies, ordering and review of radiographic studies, pulse oximetry and re-evaluation of patient's condition.  MDM   1. Acute diastolic heart failure   2. Volume overload    3. Respiratory failure    Patient came in with altered mental status. Likely related to respiratory failure due to CHF. No response to conservative measures. Required admission to the ICU and was intubated by critical care. BiPAP attempted in the ED.    Juliet Rude. Rubin Payor, MD 02/08/13 1635

## 2013-02-08 NOTE — Progress Notes (Signed)
Elink routine camera care  VS stable Vent - ok mechanics Looks well sedated on vent  ESRD with pulm edema on cxr  Plan Renal called for HD   Dr. Kalman Shan, M.D., [facility].C.P Pulmonary and Critical Care Medicine Staff Physician [facility] Pager: [phone], If no answer or between  15:00h - 7:00h: call [phone]  02/08/2013 8:34 PM

## 2013-02-08 NOTE — ED Notes (Signed)
Brandi PA CCM at bedside.

## 2013-02-08 NOTE — Progress Notes (Signed)
Met pt's wife and sister-in-law. Pt's wife explained why pt was in ed and that he was better since arriving. Pt's wife and sister-in-law waited in waiting room while pt was getting x-rays. I offered nutrition to both and they said they were ok. Will forward to unit chaplain. Marjory Lies Chaplain  02/08/13 2200  Clinical Encounter Type  Visited With Family

## 2013-02-08 NOTE — Consult Note (Signed)
PULMONARY  / CRITICAL CARE MEDICINE  Name: [name] MRN: [id number] DOB: September 27, 1950    ADMISSION DATE:  02/08/2013 CONSULTATION DATE:  02/08/2013 at 1605  REFERRING MD :  Dr. Rubin Payor PRIMARY SERVICE: PCCM  CHIEF COMPLAINT:  Altered Mental Status  BRIEF PATIENT DESCRIPTION:    62 year old AAM with HLD, Arthritis, Claudication, Chronic Anemia, CHF, PVD, HTN, Dyspnea, Catarac, ESRD on Dialysis, PUD, Stroke, GERD, and bilateral BKA evaluated in the ED for AMS. Recent dc from [place],. Now found hypotension in HD, resp failure, chf, ett  SIGNIFICANT EVENTS / STUDIES:  CBC BMP Troponin 0.02  LINES / TUBES: PIV on Right Arm 02/08/13 >> Left External Jugular IV 02/08/13 >> Left Femoral A and Central Line 02/08/13 >>  CULTURES: None, non indicated  ANTIBIOTICS: Coverage for HCAP/Aspiration PNA  HISTORY OF PRESENT ILLNESS:  62 year old AAM with HLD, Arthritis, Claudication, Chronic Anemia, CHF, PVD, HTN, Dyspnea, Catarac, ESRD on Dialysis, PUD, Stroke, GERD, and bilateral BKA evaluated in the ED for AMS.  He was brought to [facility] ED after he was found to hypotension during dialysis earlier today.  On arrival to the ED, he was only responsive to sternum rub and PCCM was consulted to further taking care of the patient.  At the time of evaluation, the patient is lethargic and minimally responsive to name calling and commands.  He has nonspecific extremities movement with agitation and sternal rub.  He was on a BIPAP with adequate oxygenation at FiO2 of 40%.  He had no desaturation when BiPAP mask was removed but he need to be intubated for airway protection due to his AMS.  During ET, he was found to have thick white secretion that required aggressive suctioning.  Left IJ line was attempted but guide wired could not be advanced due to severe stenosis due ESRD on dialysis.   PAST MEDICAL HISTORY :  Past Medical History  Diagnosis Date  . Hyperlipidemia   . Arthritis   .  Claudication   . Anemia   . Chronic diastolic CHF (congestive heart failure) 07/2010    a. 07/2008 Echo EF 50-55%, mild-mod LVH, Gr 1 DD.  Marland Kitchen Peripheral vascular occlusive disease     a. 07/2011 Periph Angio: No signif Ao-illiac dzs, LCF stenosis, 100% LSFA w recon above knee pop and 3 vessel runoff, 100% RSFA w/ recon above knee --> pending L Fem to below Knee pop bypass.  Marland Kitchen History of tobacco abuse   . Hypertension     takes Amlodipine,Metoprolol,and Prinivil daily  . Dyspnea on exertion     with exertion  . Cataract     bilateral  . Dialysis patient   . PUD (peptic ulcer disease) mid 1990's    with GI bleed.  endoscoped in mid 1990's at [facility]  . Stroke 2005    no residual  . Asthma     08/2012  . GERD (gastroesophageal reflux disease)   . History of blood transfusion     peptic ulcer  . ESRD (end stage renal disease)     a. MWF dialysis in [place] (followed by Dr. Kristian Covey)   Past Surgical History  Procedure Laterality Date  . Arteriovenous graft placement    . Av fistula placement  12/10/2000    Right brachiocephalic arteriovenous   . Aortagram  07/29/2011    Abdominal Aortagram  . Cardiac catheterization  08/01/11    Left heart catheterization  . Femoral-popliteal bypass graft  09/09/2011    Procedure: BYPASS  GRAFT FEMORAL-POPLITEAL ARTERY;  Surgeon: [name], MD;  Location: [facility] OR;  Service: Vascular;  Laterality: Left;  . Amputation  09/12/2011    Procedure: AMPUTATION BELOW KNEE;  Surgeon: [name], MD;  Location: [facility] OR;  Service: Vascular;  Laterality: Left;  TRANSMETATARSAL  . Application of wound vac  10/30/2011    Procedure: APPLICATION OF WOUND VAC;  Surgeon: [name], MD;  Location: [facility] OR;  Service: Vascular;  Laterality: Left;  . Groin debridement  11/01/2011    Procedure: Drucie Ip DEBRIDEMENT;  Surgeon: Chuck Hint, MD;  Location: [facility] OR;  Service: Vascular;  Laterality: Left;  . Esophagogastroduodenoscopy  11/06/2011    Procedure:  ESOPHAGOGASTRODUODENOSCOPY (EGD);  Surgeon: Beverley Fiedler, MD;  Location: [facility] ENDOSCOPY;  Service: Gastroenterology;  Laterality: N/A;  . Femoral-popliteal bypass graft  01/02/2012    Procedure: BYPASS GRAFT FEMORAL-POPLITEAL ARTERY;  Surgeon: [name], MD;  Location: [facility] OR;  Service: Vascular;  Laterality: Right;  . Amputation  01/02/2012    Procedure: AMPUTATION RAY;  Surgeon: [name], MD;  Location: [facility] OR;  Service: Vascular;  Laterality: Right;  . Toe amputation Right     5/13- great toe  . Exchange of a dialysis catheter N/A 07/15/2012    Procedure: EXCHANGE OF A DIALYSIS CATHETER;  Surgeon: Fransisco Hertz, MD;  Location: [facility] OR;  Service: Vascular;  Laterality: N/A;  . Av fistula placement Left 07/15/2012    Procedure: ARTERIOVENOUS (AV) FISTULA CREATION;  Surgeon: Fransisco Hertz, MD;  Location: [facility] OR;  Service: Vascular;  Laterality: Left;  . Av fistula placement Left 09/07/2012    Procedure: INSERTION OF ARTERIOVENOUS (AV) GORE-TEX GRAFT ARM;  Surgeon: Fransisco Hertz, MD;  Location: [facility] OR;  Service: Vascular;  Laterality: Left;  . Insertion of dialysis catheter Right 09/07/2012    Procedure: INSERTION OF a Femoral DIALYSIS CATHETER;  Surgeon: Fransisco Hertz, MD;  Location: [facility] OR;  Service: Vascular;  Laterality: Right;  . Lipoma excision Left 09/07/2012    Procedure: EXCISION Seroma left arm;  Surgeon: Fransisco Hertz, MD;  Location: [facility] OR;  Service: Vascular;  Laterality: Left;  . Hernia repair      umbilical  . Thrombectomy and revision of arterioventous (av) goretex  graft Left 11/30/2012    Procedure: THROMBECTOMY AND REVISION OF ARTERIOVENTOUS (AV) GORETEX  GRAFT;  Surgeon: Chuck Hint, MD;  Location: [facility] OR;  Service: Vascular;  Laterality: Left;   Prior to Admission medications   Medication Sig Start Date End Date Taking? Authorizing Provider  ALPRAZolam Prudy Feeler) 1 MG tablet Take 1 tablet (1 mg total) by mouth 2 (two) times daily as needed for anxiety or sleep. 02/01/13   Coralie Keens, FNP  calcium acetate (PHOSLO) 667 MG capsule Take 1,334-2,001 mg by mouth 3 (three) times daily with meals.     Historical Provider, MD  cinacalcet (SENSIPAR) 60 MG tablet Take 60 mg by mouth daily.    Historical Provider, MD  clarithromycin (BIAXIN) 250 MG tablet Take 1 tablet (250 mg total) by mouth 2 (two) times daily. 02/01/13   Coralie Keens, FNP  DULoxetine (CYMBALTA) 60 MG capsule Take 60 mg by mouth daily.    Historical Provider, MD  folic acid (FOLVITE) 1 MG tablet Take 1 mg by mouth daily.    Historical Provider, MD  gabapentin (NEURONTIN) 300 MG capsule Take 1 capsule (300 mg total) by mouth 3 (three) times daily. 12/07/12   Deatra Canter,  FNP  hydrALAZINE (APRESOLINE) 25 MG tablet Take 25 mg by mouth 3 (three) times daily.    Historical Provider, MD  isosorbide mononitrate (IMDUR) 30 MG 24 hr tablet Take 30 mg by mouth daily.    Historical Provider, MD  lanthanum (FOSRENOL) 1000 MG chewable tablet Chew 2 tablets (2,000 mg total) by mouth 3 (three) times daily with meals. 09/15/12   Bernadene Person, NP  multivitamin (RENA-VIT) TABS tablet Take 1 tablet by mouth daily.    Historical Provider, MD  omeprazole (PRILOSEC) 20 MG capsule Take 20 mg by mouth daily.    Historical Provider, MD  oxyCODONE-acetaminophen (PERCOCET) 7.5-325 MG per tablet Take 1 tablet by mouth every 8 (eight) hours as needed for pain. 11/30/12   Samantha J Rhyne, PA-C  predniSONE (DELTASONE) 50 MG tablet 1 tab daily x 7 days 01/20/13   Doree Albee, MD  PROAIR HFA 108 (90 BASE) MCG/ACT inhaler Inhale 1 puff into the lungs every 4 (four) hours as needed for wheezing.  10/22/12   Historical Provider, MD  tiZANidine (ZANAFLEX) 4 MG tablet Take 4 mg by mouth every 6 (six) hours as needed.  12/21/12   Historical Provider, MD   Allergies  Allergen Reactions  . Ambien [Zolpidem Tartrate] Other (See Comments)    Hallucinations    FAMILY HISTORY:  Family History  Problem Relation Age of Onset  .  Breast cancer Mother   . Cancer Mother   . Cancer Father   . Anesthesia problems Neg Hx   . Hypotension Neg Hx   . Malignant hyperthermia Neg Hx   . Pseudochol deficiency Neg Hx    SOCIAL HISTORY:  reports that he has been smoking Cigarettes.  He has a 4 pack-year smoking history. He has never used smokeless tobacco. He reports that he does not drink alcohol or use illicit drugs.  REVIEW OF SYSTEMS:  NOT POSSIBLE, PATIENT ONLY RESPONSIVE TO STERNAL RUB  SUBJECTIVE:   Patient was found to be hypotensive during dialysis and was brought to ED for further evaluation.  He was found to be in severe encephalopathy and intubation was necessary to protect his airway and prevent aspiration.  VITAL SIGNS: Temp:  [97.7 F (36.5 C)] 97.7 F (36.5 C) (11/18 1405) Pulse Rate:  [70-81] 73 (11/18 1700) Resp:  [16-27] 16 (11/18 1700) BP: (67-194)/(32-85) 154/36 mmHg (11/18 1700) SpO2:  [82 %-100 %] 100 % (11/18 1700) Arterial Line BP: (144)/(64) 144/64 mmHg (11/18 1700) FiO2 (%):  [40 %] 40 % (11/18 1507) Weight:  [69.088 kg (152 lb 5 oz)] 69.088 kg (152 lb 5 oz) (11/18 1405)  HEMODYNAMICS:   VENTILATOR SETTINGS: Vent Mode:  [-] PRVC FiO2 (%):  [40 %] 40 % Set Rate:  [16 bmp] 16 bmp Vt Set:  [500 mL] 500 mL PEEP:  [5 cmH20-8 cmH20] 5 cmH20 Pressure Support:  [8 cmH20] 8 cmH20 Plateau Pressure:  [37 cmH20] 37 cmH20PRVC FiO2 40%, PEEP 5, RR 16, TV500  INTAKE / OUTPUT: Intake/Output   None     PHYSICAL EXAMINATION: General:  Elderly appearing AAM lying in bed minimally responsive to name calling and sternal rub Neuro:    AAOx1 at max pre ett, slightly responsive to name calling and pain stimuli HEENT:  NCAT, No lesion, No tenderness, Coarse dry hair and facial hair     EOMI, PERRLA with light but not following finger spontaneously     Left ear with pulse ox device, bilaterally intact, no discharge, no sign of infection Cardiovascular:  RRR, no murmur rub/gallop, S1 and S2 heard, no s3  nor s4 appreciated Lungs:  Coarse, rhonchi heard, gargling, sound edematous Abdomen:  Abdomen is slightly distended, normal to percussion, non tender, no guarding, not 100% soft Musculoskeletal:  Bilateral BKA ambutation, atrophic muscles, and dry skin.    No edema appreciated    AVF graft on right upper arm Skin:  Dry and scaly for the most part.  No ulcers.  No lesions.  Warm to touch  LABS:  CBC  Recent Labs Lab 02/08/13 1437 02/08/13 1456  WBC 5.8  --   HGB 10.2* 11.6*  HCT 33.7* 34.0*  PLT 194  --    Coag's No results found for this basename: APTT, INR,  in the last 168 hours BMET  Recent Labs Lab 02/08/13 1437 02/08/13 1456  NA 139 141  K 3.4* 3.0*  CL 98 101  CO2 29  --   BUN 32* 31*  CREATININE 7.34* 7.40*  GLUCOSE 94 94   Electrolytes  Recent Labs Lab 02/08/13 1437  CALCIUM 9.6   Sepsis Markers  Recent Labs Lab 02/08/13 1457  LATICACIDVEN 0.76   ABG  Recent Labs Lab 02/08/13 1643  PHART 7.408  PCO2ART 43.0  PO2ART 99.0   Liver Enzymes  Recent Labs Lab 02/08/13 1437  AST 14  ALT 8  ALKPHOS 85  BILITOT 0.7  ALBUMIN 2.8*   Cardiac Enzymes No results found for this basename: TROPONINI, PROBNP,  in the last 168 hours Glucose No results found for this basename: GLUCAP,  in the last 168 hours  Imaging Dg Chest Port 1 View  02/08/2013   CLINICAL DATA:  Shortness of breath.  Unresponsive.  EXAM: PORTABLE CHEST - 1 VIEW  COMPARISON:  02/07/2013.  FINDINGS: Persistent prominent bilateral pleural effusions present. Interstitial prominence noted on today's exam suggesting possible component of congestive heart failure. Cardiomegaly. Stable vascular calcification noted. No pneumothorax.  IMPRESSION: 1. Persistent prominent bilateral pleural effusions. 2. New onset of pulmonary interstitial edema bilaterally suggesting a component of congestive heart failure.   Electronically Signed   By: Maisie Fus  Register   On: 02/08/2013 14:43     CXR:   Order, Pending 1 View Portable  ASSESSMENT / PLAN:  PULMONARY A: Pulmonary Edema, effusions  Likely secondary to volume overload in light of ESRD, CHF, and unknown recent cardiac function  Cannot rule out PNA at this time P:    S/p intubation on ventilator  Nephrology consulted for possibility of dialysis  abg now on current mV wnl, maintain  Keep fio2 at 40%, paO2 wnl  May need ct chest evaluation effusions vs [place]  Need neg balance  CARDIOVASCULAR A: CHF and HTN on history, r/o ischemia  P:    Currently appear euvolumic except for pulmonary secretion on intubation  Attempted cvp, unable, stenosis, appears as terminal access, able to get fem vein  Currently normal tensive borderline hypotensive  A line placed  Hold home Hydralazine and antihypertensive   Last ECHO in June was normal with EF 50-55%, no diastolic dysfunction, may need repeat  RENAL A:  ESRD on Dialysis, hypoK P:    Nephrology consulted  Hold ESRD home medication until extubation  PhosLo, Sensipar, Renal Tabs, Fosrenol  k supp, conservative  GASTROINTESTINAL A:  GERD P:    On Prilosec at home  Provide IV Protonix Daily  LFT  NPO  HEMATOLOGIC A:  Chronic Normocytic Anemia  Likely secondary to ESRD  Current Hbg Level at 10.2 P:  Will monitor cbc in am   Consider EPO if necessary, per renal  Nephrology consulted  INFECTIOUS A:  Suspected Pneumonia  Possible HCAP since ESRD on dialysis  Possible Aspiration due to AMS P:    CXR ordered, Pending Result  Blood and Sputum culture  Consider nosocomial coverage  ENDOCRINE A:  No Documented Problem   P:  Consider ssi    NEUROLOGIC A:  Severe Encephalopathy  Likely uremic since ESRD, ruling out HCAP and CHF P:    S/p intubation and on ventilator support  On Fentanyl drip to rass -2  TODAY'S SUMMARY: Patient was evaluated in the ED for encephalopathy.  Intubation in ED, vented, and sedation with Fentanyl.  Left femoral central and arterial  line placed.  Vanc/Zosyn for suspected HCAP, blood culture, and sputum culture.  Nephrology consulted for dialysis needs.  ECHO for ruling out acute HF, Troponin trending x2 with one negative set, BNP check, Chest XR ordered.  Repeat Lab in the AM.  I have personally obtained a history, examined the patient, evaluated laboratory and imaging results, formulated the assessment and plan and placed orders. CRITICAL CARE: The patient is critically ill with multiple organ systems failure and requires high complexity decision making for assessment and support, frequent evaluation and titration of therapies, application of advanced monitoring technologies and extensive interpretation of multiple databases. Critical Care Time devoted to patient care services described in this note is 90 min  minutes.   [name]. [name], MD, FACP Pgr: [phone] [facility]  Pulmonary and Critical Care Medicine [facility] Pager: [phone]  02/08/2013, 5:15 PM

## 2013-02-08 NOTE — Progress Notes (Signed)
Pt. Here from [facility] for sob and respiratory distress. Patient not available. Prayer was made at bedside for patient while staff continued to provide care. Pt. Being intubated.  I spoke with patient's wife and she is aware that patient is here.  Wife said she would get here as soon as she could get a ride.  Will pass on to fellow Chaplain for ongoing support.  02/08/13 1400  Clinical Encounter Type  Visited With Patient;Health care provider  Visit Type Spiritual support;Critical Care;ED  Referral From Nurse (rounding)  Spiritual Encounters  Spiritual Needs Prayer;Emotional  Venida Jarvis, [address]

## 2013-02-08 NOTE — Progress Notes (Signed)
Unit CM UR Completed by [facility] ED CM  W. Noraa Pickeral RN  

## 2013-02-08 NOTE — ED Notes (Signed)
Pt here by [facility] for sob and respiratory distress. Pt son called ems when he bagan having issues breathing, pt was seen at [facility] yesterday for sob and discharged, pt with hx of chf, emphysema, and dialysis, pt received dialysis yesterday, pt reported to be only responsive to painful stimuli, on arrival pt responds to verbal stimuli, per dr pickering lung sounds junkie and wheezing noted to left.

## 2013-02-08 NOTE — Procedures (Signed)
Central Venous Catheter Insertion Procedure Note left fem ( no other access) Failed sterile rt ij, stenosis? aborted HAYDIN CALANDRA [id number] May 31, 1950  Procedure: Insertion of Central Venous Catheter Indications: Assessment of intravascular volume, Drug and/or fluid administration and Frequent blood sampling  Procedure Details Consent: Unable to obtain consent because of emergent medical necessity. Time Out: Verified patient identification, verified procedure, site/side was marked, verified correct patient position, special equipment/implants available, medications/allergies/relevent history reviewed, required imaging and test results available.  Performed  Maximum sterile technique was used including antiseptics, cap, gloves, gown, hand hygiene, mask and sheet. Skin prep: Chlorhexidine; local anesthetic administered A antimicrobial bonded/coated triple lumen catheter was placed in the left femoral vein due to multiple attempts, no other available access using the Seldinger technique.  Evaluation Blood flow good Complications: No apparent complications Patient did tolerate procedure well. Chest X-ray ordered to verify placement.  CXR: not needed for fem, will assess pcxr.  [name] 02/08/2013, 5:17 PM

## 2013-02-08 NOTE — ED Notes (Signed)
Dr [name] at bedside and intubation in progress on arrival. meds as documented,

## 2013-02-08 NOTE — Progress Notes (Signed)
ANTIBIOTIC CONSULT NOTE - INITIAL  Pharmacy Consult for vancomycin and Zosyn Indication: rule out pneumonia  Allergies  Allergen Reactions  . Ambien [Zolpidem Tartrate] Other (See Comments)    Hallucinations    Patient Measurements: Height: 5\' 8"  (172.7 cm) Weight: 152 lb 5 oz (69.088 kg) IBW/kg (Calculated) : 68.4 Adjusted Body Weight: 70 kg  Vital Signs: Temp: 96.8 F (36 C) (11/18 1815) Temp src: Axillary (11/18 1405) BP: 149/48 mmHg (11/18 1815) Pulse Rate: 70 (11/18 1815) Intake/Output from previous day:   Intake/Output from this shift:    Labs:  Recent Labs  02/08/13 1437 02/08/13 1456  WBC 5.8  --   HGB 10.2* 11.6*  PLT 194  --   CREATININE 7.34* 7.40*   Estimated Creatinine Clearance: 9.8 ml/min (by C-G formula based on Cr of 7.4). No results found for this basename: VANCOTROUGH, VANCOPEAK, VANCORANDOM, GENTTROUGH, GENTPEAK, GENTRANDOM, TOBRATROUGH, TOBRAPEAK, TOBRARND, AMIKACINPEAK, AMIKACINTROU, AMIKACIN,  in the last 72 hours   Microbiology: No results found for this or any previous visit (from the past 720 hour(s)).  Medical History: Past Medical History  Diagnosis Date  . Hyperlipidemia   . Arthritis   . Claudication   . Anemia   . Chronic diastolic CHF (congestive heart failure) 07/2010    a. 07/2008 Echo EF 50-55%, mild-mod LVH, Gr 1 DD.  Marland Kitchen Peripheral vascular occlusive disease     a. 07/2011 Periph Angio: No signif Ao-illiac dzs, LCF stenosis, 100% LSFA w recon above knee pop and 3 vessel runoff, 100% RSFA w/ recon above knee --> pending L Fem to below Knee pop bypass.  Marland Kitchen History of tobacco abuse   . Hypertension     takes Amlodipine,Metoprolol,and Prinivil daily  . Dyspnea on exertion     with exertion  . Cataract     bilateral  . Dialysis patient   . PUD (peptic ulcer disease) mid 1990's    with GI bleed.  endoscoped in mid 1990's at [facility]  . Stroke 2005    no residual  . Asthma     08/2012  . GERD (gastroesophageal  reflux disease)   . History of blood transfusion     peptic ulcer  . ESRD (end stage renal disease)     a. MWF dialysis in [place] (followed by Dr. Kristian Covey)    Medications:  See home med list Assessment: 62 year old man with ESRD and recent hospital admission to start on vancomycin and Zosyn for suspected pneumonia.  Goal of Therapy:  Vancomycin pre-HD level 15-25 mg/L  Plan:  Follow up culture results zosyn 2.25g IV q8h Vancomycin 1.5g IV x 1, then 750mg  after each HD session.  Mickeal Skinner 02/08/2013,6:37 PM

## 2013-02-08 NOTE — ED Notes (Signed)
Pt transported to CT ?

## 2013-02-08 NOTE — Consult Note (Signed)
[facility]  CONSULT  [name] is an 62 y.o. male.    Chief Complaint: AMS and respiratory arrest HPI: Pt is a 62yo AA with PMH sig for ESRD on HD qMWF (in [place] followed by Dr. Sundra Aland), CAD, HTN, PVD s/p Left transmet, right great toe ampututation, CHF (EF50-55%) and CVA who was transferred from [facility] where he presented with AMS and hypotension at dialysis.  The patient has had multiple admissions for pulm edema and pleural effusions.  Recently d/c'd from [place] with similar presentation.  He was transferred to [facility] for further evaluation and subsequently intubated and admitted to the MICU.  We were consulted to help manage his ESRD and volume.    PMH: Past Medical History  Diagnosis Date  . Hyperlipidemia   . Arthritis   . Claudication   . Anemia   . Chronic diastolic CHF (congestive heart failure) 07/2010    a. 07/2008 Echo EF 50-55%, mild-mod LVH, Gr 1 DD.  Marland Kitchen Peripheral vascular occlusive disease     a. 07/2011 Periph Angio: No signif Ao-illiac dzs, LCF stenosis, 100% LSFA w recon above knee pop and 3 vessel runoff, 100% RSFA w/ recon above knee --> pending L Fem to below Knee pop bypass.  Marland Kitchen History of tobacco abuse   . Hypertension     takes Amlodipine,Metoprolol,and Prinivil daily  . Dyspnea on exertion     with exertion  . Cataract     bilateral  . Dialysis patient   . PUD (peptic ulcer disease) mid 1990's    with GI bleed.  endoscoped in mid 1990's at [facility]  . Stroke 2005    no residual  . Asthma     08/2012  . GERD (gastroesophageal reflux disease)   . History of blood transfusion     peptic ulcer  . ESRD (end stage renal disease)     a. MWF dialysis in [place] (followed by Dr. Kristian Covey)   PSH: Past Surgical History  Procedure Laterality Date  . Arteriovenous graft placement    . Av fistula placement  12/10/2000    Right brachiocephalic arteriovenous   . Aortagram  07/29/2011    Abdominal Aortagram  . Cardiac  catheterization  08/01/11    Left heart catheterization  . Femoral-popliteal bypass graft  09/09/2011    Procedure: BYPASS GRAFT FEMORAL-POPLITEAL ARTERY;  Surgeon: [name], MD;  Location: [facility] OR;  Service: Vascular;  Laterality: Left;  . Amputation  09/12/2011    Procedure: AMPUTATION BELOW KNEE;  Surgeon: [name], MD;  Location: [facility] OR;  Service: Vascular;  Laterality: Left;  TRANSMETATARSAL  . Application of wound vac  10/30/2011    Procedure: APPLICATION OF WOUND VAC;  Surgeon: [name], MD;  Location[facility] OR;  Service: Vascular;  Laterality: Left;  . Groin debridement  11/01/2011    Procedure: Drucie Ip DEBRIDEMENT;  Surgeon: Chuck Hint, MD;  Location: [facility] OR;  Service: Vascular;  Laterality: Left;  . Esophagogastroduodenoscopy  11/06/2011    Procedure: ESOPHAGOGASTRODUODENOSCOPY (EGD);  Surgeon: Beverley Fiedler, MD;  Location: [facility] ENDOSCOPY;  Service: Gastroenterology;  Laterality: N/A;  . Femoral-popliteal bypass graft  01/02/2012    Procedure: BYPASS GRAFT FEMORAL-POPLITEAL ARTERY;  Surgeon: [name], MD;  Location: [facility] OR;  Service: Vascular;  Laterality: Right;  . Amputation  01/02/2012    Procedure: AMPUTATION RAY;  Surgeon: [name], MD;  Location: [facility] OR;  Service: Vascular;  Laterality: Right;  . Toe amputation Right  5/13- great toe  . Exchange of a dialysis catheter N/A 07/15/2012    Procedure: EXCHANGE OF A DIALYSIS CATHETER;  Surgeon: Fransisco Hertz, MD;  Location: [facility] OR;  Service: Vascular;  Laterality: N/A;  . Av fistula placement Left 07/15/2012    Procedure: ARTERIOVENOUS (AV) FISTULA CREATION;  Surgeon: Fransisco Hertz, MD;  Location: [facility] OR;  Service: Vascular;  Laterality: Left;  . Av fistula placement Left 09/07/2012    Procedure: INSERTION OF ARTERIOVENOUS (AV) GORE-TEX GRAFT ARM;  Surgeon: Fransisco Hertz, MD;  Location: [facility] OR;  Service: Vascular;  Laterality: Left;  . Insertion of dialysis catheter Right 09/07/2012    Procedure: INSERTION OF a  Femoral DIALYSIS CATHETER;  Surgeon: Fransisco Hertz, MD;  Location: [facility] OR;  Service: Vascular;  Laterality: Right;  . Lipoma excision Left 09/07/2012    Procedure: EXCISION Seroma left arm;  Surgeon: Fransisco Hertz, MD;  Location: [facility] OR;  Service: Vascular;  Laterality: Left;  . Hernia repair      umbilical  . Thrombectomy and revision of arterioventous (av) goretex  graft Left 11/30/2012    Procedure: THROMBECTOMY AND REVISION OF ARTERIOVENTOUS (AV) GORETEX  GRAFT;  Surgeon: Chuck Hint, MD;  Location: [facility] OR;  Service: Vascular;  Laterality: Left;    DIALYSIS: Dialyzes at [facility] HD on MWF. Primary Nephrologist Bevekadu.  Past Medical History  Diagnosis Date  . Hyperlipidemia   . Arthritis   . Claudication   . Anemia   . Chronic diastolic CHF (congestive heart failure) 07/2010    a. 07/2008 Echo EF 50-55%, mild-mod LVH, Gr 1 DD.  Marland Kitchen Peripheral vascular occlusive disease     a. 07/2011 Periph Angio: No signif Ao-illiac dzs, LCF stenosis, 100% LSFA w recon above knee pop and 3 vessel runoff, 100% RSFA w/ recon above knee --> pending L Fem to below Knee pop bypass.  Marland Kitchen History of tobacco abuse   . Hypertension     takes Amlodipine,Metoprolol,and Prinivil daily  . Dyspnea on exertion     with exertion  . Cataract     bilateral  . Dialysis patient   . PUD (peptic ulcer disease) mid 1990's    with GI bleed.  endoscoped in mid 1990's at [facility]  . Stroke 2005    no residual  . Asthma     08/2012  . GERD (gastroesophageal reflux disease)   . History of blood transfusion     peptic ulcer  . ESRD (end stage renal disease)     a. MWF dialysis in [place] (followed by Dr. Kristian Covey)    Medications:  I have reviewed the patient's current medications.  Medications Prior to Admission  Medication Sig Dispense Refill  . ALPRAZolam (XANAX) 1 MG tablet Take 1 tablet (1 mg total) by mouth 2 (two) times daily as needed for anxiety or sleep.  60 tablet  0  . calcium acetate (PHOSLO) 667 MG  capsule Take 1,334-2,001 mg by mouth 3 (three) times daily with meals.       . cinacalcet (SENSIPAR) 60 MG tablet Take 60 mg by mouth daily.      . clarithromycin (BIAXIN) 250 MG tablet Take 1 tablet (250 mg total) by mouth 2 (two) times daily.  14 tablet  0  . DULoxetine (CYMBALTA) 60 MG capsule Take 60 mg by mouth daily.      . folic acid (FOLVITE) 1 MG tablet Take 1 mg by mouth daily.      Marland Kitchen gabapentin (NEURONTIN)  300 MG capsule Take 1 capsule (300 mg total) by mouth 3 (three) times daily.  90 capsule  11  . hydrALAZINE (APRESOLINE) 25 MG tablet Take 25 mg by mouth 3 (three) times daily.      . isosorbide mononitrate (IMDUR) 30 MG 24 hr tablet Take 30 mg by mouth daily.      Marland Kitchen lanthanum (FOSRENOL) 1000 MG chewable tablet Chew 2 tablets (2,000 mg total) by mouth 3 (three) times daily with meals.  60 tablet  0  . multivitamin (RENA-VIT) TABS tablet Take 1 tablet by mouth daily.      Marland Kitchen omeprazole (PRILOSEC) 20 MG capsule Take 20 mg by mouth daily.      Marland Kitchen oxyCODONE-acetaminophen (PERCOCET) 7.5-325 MG per tablet Take 1 tablet by mouth every 8 (eight) hours as needed for pain.  20 tablet  0  . predniSONE (DELTASONE) 50 MG tablet 1 tab daily x 7 days  7 tablet  0  . PROAIR HFA 108 (90 BASE) MCG/ACT inhaler Inhale 1 puff into the lungs every 4 (four) hours as needed for wheezing.       Marland Kitchen tiZANidine (ZANAFLEX) 4 MG tablet Take 4 mg by mouth every 6 (six) hours as needed.         ALLERGIES:   Allergies  Allergen Reactions  . Ambien [Zolpidem Tartrate] Other (See Comments)    Hallucinations    FAM HX: Family History  Problem Relation Age of Onset  . Breast cancer Mother   . Cancer Mother   . Cancer Father   . Anesthesia problems Neg Hx   . Hypotension Neg Hx   . Malignant hyperthermia Neg Hx   . Pseudochol deficiency Neg Hx     Social History:   reports that he has been smoking Cigarettes.  He has a 4 pack-year smoking history. He has never used smokeless tobacco. He reports that he  does not drink alcohol or use illicit drugs.  ROS: Review of systems not obtained due to patient factors.  PE: General appearance: Intubated and sedated Head: Normocephalic, without obvious abnormality, atraumatic Eyes: +hyperpigentation of sclera and arcus senilis bilaterally  Neck: no adenopathy, no carotid bruit, no JVD, supple, symmetrical, trachea midline and thyroid not enlarged, symmetric, no tenderness/mass/nodules Resp: rhonchi bilaterally Cardio: regular rate and rhythm and no rub GI: soft, non-tender; bowel sounds normal; no masses,  no organomegaly Extremities: s/p left transmet amputation, right great toe amputation, LUE AVG +T/B Neuro: intubated and sedated BMP:  Recent Labs Lab 02/08/13 1437 02/08/13 1456 02/08/13 1903  NA 139 141  --   K 3.4* 3.0*  --   CL 98 101  --   CO2 29  --   --   GLUCOSE 94 94  --   BUN 32* 31*  --   CREATININE 7.34* 7.40* 6.99*  CALCIUM 9.6  --   --    Liver Function Tests:  Recent Labs Lab 02/08/13 1437  AST 14  ALT 8  ALKPHOS 85  BILITOT 0.7  PROT 7.7  ALBUMIN 2.8*   No results found for this basename: LIPASE, AMYLASE,  in the last 168 hours No results found for this basename: AMMONIA,  in the last 168 hours CBC:  Recent Labs Lab 02/08/13 1437 02/08/13 1456 02/08/13 1903  WBC 5.8  --  5.3  NEUTROABS 4.0  --   --   HGB 10.2* 11.6* 11.7*  HCT 33.7* 34.0* 38.0*  MCV 88.7  --  88.2  PLT 194  --  167   PT/INR: @labrcntip (inr:5) Cardiac Enzymes: No results found for this basename: CKTOTAL, CKMB, CKMBINDEX, TROPONINI,  in the last 168 hours CBG:  Recent Labs Lab 02/08/13 2005 02/08/13 2007  GLUCAP <10* 122*    Iron Studies: No results found for this basename: IRON, TIBC, TRANSFERRIN, FERRITIN,  in the last 168 hours  Results for orders placed during the hospital encounter of 02/08/13 (from the past 48 hour(s))  CBC WITH DIFFERENTIAL     Status: Abnormal   Collection Time    02/08/13  2:37 PM      Result  Value Range   WBC 5.8  4.0 - 10.5 K/uL   RBC 3.80 (*) 4.22 - 5.81 MIL/uL   Hemoglobin 10.2 (*) 13.0 - 17.0 g/dL   HCT 16.1 (*) 09.6 - 04.5 %   MCV 88.7  78.0 - 100.0 fL   MCH 26.8  26.0 - 34.0 pg   MCHC 30.3  30.0 - 36.0 g/dL   RDW 40.9 (*) 81.1 - 91.4 %   Platelets 194  150 - 400 K/uL   Neutrophils Relative % 68  43 - 77 %   Lymphocytes Relative 23  12 - 46 %   Monocytes Relative 4  3 - 12 %   Eosinophils Relative 4  0 - 5 %   Basophils Relative 1  0 - 1 %   Neutro Abs 4.0  1.7 - 7.7 K/uL   Lymphs Abs 1.3  0.7 - 4.0 K/uL   Monocytes Absolute 0.2  0.1 - 1.0 K/uL   Eosinophils Absolute 0.2  0.0 - 0.7 K/uL   Basophils Absolute 0.1  0.0 - 0.1 K/uL   RBC Morphology POLYCHROMASIA PRESENT     WBC Morphology ATYPICAL LYMPHOCYTES    COMPREHENSIVE METABOLIC PANEL     Status: Abnormal   Collection Time    02/08/13  2:37 PM      Result Value Range   Sodium 139  135 - 145 mEq/L   Potassium 3.4 (*) 3.5 - 5.1 mEq/L   Chloride 98  96 - 112 mEq/L   CO2 29  19 - 32 mEq/L   Glucose, Bld 94  70 - 99 mg/dL   BUN 32 (*) 6 - 23 mg/dL   Creatinine, Ser 7.82 (*) 0.50 - 1.35 mg/dL   Calcium 9.6  8.4 - 95.6 mg/dL   Total Protein 7.7  6.0 - 8.3 g/dL   Albumin 2.8 (*) 3.5 - 5.2 g/dL   AST 14  0 - 37 U/L   ALT 8  0 - 53 U/L   Alkaline Phosphatase 85  39 - 117 U/L   Total Bilirubin 0.7  0.3 - 1.2 mg/dL   GFR calc non Af Amer 7 (*) >90 mL/min   GFR calc Af Amer 8 (*) >90 mL/min   Comment: (NOTE)     The eGFR has been calculated using the CKD EPI equation.     This calculation has not been validated in all clinical situations.     eGFR's persistently <90 mL/min signify possible Chronic Kidney     Disease.  POCT I-STAT TROPONIN I     Status: None   Collection Time    02/08/13  2:55 PM      Result Value Range   Troponin i, poc 0.02  0.00 - 0.08 ng/mL   Comment 3            Comment: Due to the release kinetics of cTnI,     a  negative result within the first hours     of the onset of symptoms does  not rule out     myocardial infarction with certainty.     If myocardial infarction is still suspected,     repeat the test at appropriate intervals.  POCT I-STAT, CHEM 8     Status: Abnormal   Collection Time    02/08/13  2:56 PM      Result Value Range   Sodium 141  135 - 145 mEq/L   Potassium 3.0 (*) 3.5 - 5.1 mEq/L   Chloride 101  96 - 112 mEq/L   BUN 31 (*) 6 - 23 mg/dL   Creatinine, Ser 1.61 (*) 0.50 - 1.35 mg/dL   Glucose, Bld 94  70 - 99 mg/dL   Calcium, Ion 0.96  0.45 - 1.30 mmol/L   TCO2 27  0 - 100 mmol/L   Hemoglobin 11.6 (*) 13.0 - 17.0 g/dL   HCT 40.9 (*) 81.1 - 91.4 %  CG4 I-STAT (LACTIC ACID)     Status: None   Collection Time    02/08/13  2:57 PM      Result Value Range   Lactic Acid, Venous 0.76  0.5 - 2.2 mmol/L  POCT I-STAT 3, BLOOD GAS (G3+)     Status: Abnormal   Collection Time    02/08/13  4:43 PM      Result Value Range   pH, Arterial 7.408  7.350 - 7.450   pCO2 arterial 43.0  35.0 - 45.0 mmHg   pO2, Arterial 99.0  80.0 - 100.0 mmHg   Bicarbonate 27.2 (*) 20.0 - 24.0 mEq/L   TCO2 28  0 - 100 mmol/L   O2 Saturation 98.0     Acid-Base Excess 2.0  0.0 - 2.0 mmol/L   Collection site RADIAL, ALLEN'S TEST ACCEPTABLE     Drawn by RT     Sample type ARTERIAL    CBC     Status: Abnormal   Collection Time    02/08/13  7:03 PM      Result Value Range   WBC 5.3  4.0 - 10.5 K/uL   RBC 4.31  4.22 - 5.81 MIL/uL   Hemoglobin 11.7 (*) 13.0 - 17.0 g/dL   HCT 78.2 (*) 95.6 - 21.3 %   MCV 88.2  78.0 - 100.0 fL   MCH 27.1  26.0 - 34.0 pg   MCHC 30.8  30.0 - 36.0 g/dL   RDW 08.6 (*) 57.8 - 46.9 %   Platelets 167  150 - 400 K/uL  CREATININE, SERUM     Status: Abnormal   Collection Time    02/08/13  7:03 PM      Result Value Range   Creatinine, Ser 6.99 (*) 0.50 - 1.35 mg/dL   GFR calc non Af Amer 7 (*) >90 mL/min   GFR calc Af Amer 9 (*) >90 mL/min   Comment: (NOTE)     The eGFR has been calculated using the CKD EPI equation.     This calculation has not  been validated in all clinical situations.     eGFR's persistently <90 mL/min signify possible Chronic Kidney     Disease.  LACTIC ACID, PLASMA     Status: Abnormal   Collection Time    02/08/13  7:03 PM      Result Value Range   Lactic Acid, Venous 3.6 (*) 0.5 - 2.2 mmol/L  PRO B NATRIURETIC PEPTIDE     Status: Abnormal  Collection Time    02/08/13  7:03 PM      Result Value Range   Pro B Natriuretic peptide (BNP) 27711.0 (*) 0 - 125 pg/mL  GLUCOSE, CAPILLARY     Status: Abnormal   Collection Time    02/08/13  8:05 PM      Result Value Range   Glucose-Capillary <10 (*) 70 - 99 mg/dL  GLUCOSE, CAPILLARY     Status: Abnormal   Collection Time    02/08/13  8:07 PM      Result Value Range   Glucose-Capillary 122 (*) 70 - 99 mg/dL    Ct Head Wo Contrast  02/08/2013   CLINICAL DATA:  Encephalopathy. Shortness of breath. Altered mental status.  EXAM: CT HEAD WITHOUT CONTRAST  TECHNIQUE: Contiguous axial images were obtained from the base of the skull through the vertex without intravenous contrast.  COMPARISON:  09/08/2012  FINDINGS: 1.8 x 0.9 cm hypodensity in the left inferior cerebellum compatible with remote stroke, stable.  Brainstem, thalami, and basal ganglia unremarkable. Ventricular system and basilar cisterns normal.  Confluent hypodensity in the right frontal lobe posteriorly along the sylvian fissure similar to prior exam and likely representing a combination of remote CVA and chronic ischemic microvascular white matter disease. Suspected remote small stroke in the left frontal lobe, stable, with small punctate dystrophic calcification.  Fluid density in the posterior nasopharynx noted. No intracranial hemorrhage, mass lesion, or acute CVA. Atherosclerotic calcification of the carotid siphons noted.  IMPRESSION: 1. Remote strokes in the frontal lobes and left cerebellum. 2. Periventricular white matter and corona radiata hypodensities favor chronic ischemic microvascular white  matter disease. 3. No acute intracranial findings.   Electronically Signed   By: Herbie Baltimore M.D.   On: 02/08/2013 19:57   Dg Chest Port 1 View  02/08/2013   CLINICAL DATA:  Endotracheal tube placement  EXAM: PORTABLE CHEST - 1 VIEW  COMPARISON:  Radiograph 02/08/2013  FINDINGS: Endotracheal tube is 4.3 cm from carinal. Stable enlarged cardiac silhouette. There is bilateral perihilar airspace disease. There are bilateral pleural effusions.  IMPRESSION: Endotracheal tube in good position.  Bilateral airspace disease and pleural fluid suggests pulmonary edema / congestive heart failure.   Electronically Signed   By: Genevive Bi M.D.   On: 02/08/2013 19:02   Dg Chest Port 1 View  02/08/2013   CLINICAL DATA:  Shortness of breath.  Unresponsive.  EXAM: PORTABLE CHEST - 1 VIEW  COMPARISON:  02/07/2013.  FINDINGS: Persistent prominent bilateral pleural effusions present. Interstitial prominence noted on today's exam suggesting possible component of congestive heart failure. Cardiomegaly. Stable vascular calcification noted. No pneumothorax.  IMPRESSION: 1. Persistent prominent bilateral pleural effusions. 2. New onset of pulmonary interstitial edema bilaterally suggesting a component of congestive heart failure.   Electronically Signed   By: Maisie Fus  Register   On: 02/08/2013 14:43    Assessment/Plan 1. VDRF/pulm edema- has been a recurring issue as well as pleural effusions.  Will plan for urgent HD with UF to aid in extubation/oxygenation.  May require pressors if BP continues to drop.  Plan per [facility] 2. ESRD- on HD qMWF in [place].  May need longer HD and lower EDW given recurring pulm edema and resp failure.   3. Vascular access: LUE AVG +T/B placed in June 2014 by Dr. Edilia Bo 4. AMS- possible aspiration PNA- plan per PCCM 5. ?HCAP/aspiration PNA- on zosyn per PCCM 6. Anemia of chronic disease- Hgb 11.6, no epo 7. Severe protein malnutrition- per PCCM 8. Hypokalemia-  added K bath with HD 9. HTN-  slightly hypotensive. ?early SIRS vs versed/fentanyl gtt.  Hold home BP meds.  May need pressors for UF with HD 10. SHPTH/MBD- hold sensipar/phoslo/fosrenol for now  Jhana Giarratano A 02/08/2013, 8:44 PM

## 2013-02-08 NOTE — ED Notes (Signed)
Critical care at bedside  

## 2013-02-09 ENCOUNTER — Inpatient Hospital Stay (HOSPITAL_COMMUNITY): Payer: Medicare Other

## 2013-02-09 ENCOUNTER — Encounter: Payer: Medicare Other | Admitting: Cardiothoracic Surgery

## 2013-02-09 DIAGNOSIS — J90 Pleural effusion, not elsewhere classified: Secondary | ICD-10-CM

## 2013-02-09 DIAGNOSIS — N186 End stage renal disease: Secondary | ICD-10-CM

## 2013-02-09 DIAGNOSIS — J189 Pneumonia, unspecified organism: Secondary | ICD-10-CM

## 2013-02-09 DIAGNOSIS — I517 Cardiomegaly: Secondary | ICD-10-CM

## 2013-02-09 LAB — CBC WITH DIFFERENTIAL/PLATELET
Basophils Absolute: 0 10*3/uL (ref 0.0–0.1)
Basophils Relative: 0 % (ref 0–1)
Eosinophils Absolute: 0 10*3/uL (ref 0.0–0.7)
HCT: 34 % — ABNORMAL LOW (ref 39.0–52.0)
Hemoglobin: 10.6 g/dL — ABNORMAL LOW (ref 13.0–17.0)
Lymphs Abs: 0.5 10*3/uL — ABNORMAL LOW (ref 0.7–4.0)
MCHC: 31.2 g/dL (ref 30.0–36.0)
MCV: 87.9 fL (ref 78.0–100.0)
Monocytes Absolute: 0.1 10*3/uL (ref 0.1–1.0)
Monocytes Relative: 1 % — ABNORMAL LOW (ref 3–12)
Platelets: 209 10*3/uL (ref 150–400)
RDW: 22.3 % — ABNORMAL HIGH (ref 11.5–15.5)
WBC: 10.2 10*3/uL (ref 4.0–10.5)

## 2013-02-09 LAB — HEPATITIS B SURFACE ANTIGEN: Hepatitis B Surface Ag: NEGATIVE

## 2013-02-09 LAB — COMPREHENSIVE METABOLIC PANEL
AST: 16 U/L (ref 0–37)
Albumin: 2.8 g/dL — ABNORMAL LOW (ref 3.5–5.2)
Alkaline Phosphatase: 87 U/L (ref 39–117)
Calcium: 9.4 mg/dL (ref 8.4–10.5)
Chloride: 98 mEq/L (ref 96–112)
Creatinine, Ser: 4.94 mg/dL — ABNORMAL HIGH (ref 0.50–1.35)
GFR calc Af Amer: 13 mL/min — ABNORMAL LOW (ref >90)
GFR calc non Af Amer: 11 mL/min — ABNORMAL LOW (ref >90)
Potassium: 4.3 mEq/L (ref 3.5–5.1)
Total Protein: 8 g/dL (ref 6.0–8.3)

## 2013-02-09 LAB — GLUCOSE, CAPILLARY
Glucose-Capillary: 122 mg/dL — ABNORMAL HIGH (ref 70–99)
Glucose-Capillary: 104 mg/dL — ABNORMAL HIGH (ref 70–99)
Glucose-Capillary: 80 mg/dL (ref 70–99)

## 2013-02-09 LAB — IRON AND TIBC
Iron: 16 ug/dL — ABNORMAL LOW (ref 42–135)
TIBC: 139 ug/dL — ABNORMAL LOW (ref 215–435)

## 2013-02-09 LAB — LACTIC ACID, PLASMA: Lactic Acid, Venous: 1.1 mmol/L (ref 0.5–2.2)

## 2013-02-09 LAB — MAGNESIUM: Magnesium: 2.1 mg/dL (ref 1.5–2.5)

## 2013-02-09 LAB — PHOSPHORUS: Phosphorus: 4.2 mg/dL (ref 2.3–4.6)

## 2013-02-09 LAB — PROTIME-INR
INR: 1.12 (ref 0.00–1.49)
Prothrombin Time: 14.2 seconds (ref 11.6–15.2)

## 2013-02-09 MED ORDER — VANCOMYCIN HCL 500 MG IV SOLR
500.0000 mg | Freq: Once | INTRAVENOUS | Status: AC
  Administered 2013-02-09: 500 mg via INTRAVENOUS
  Filled 2013-02-09 (×2): qty 500

## 2013-02-09 MED ORDER — MIDAZOLAM HCL 2 MG/2ML IJ SOLN
2.0000 mg | Freq: Once | INTRAMUSCULAR | Status: AC
  Administered 2013-02-09: 2 mg via INTRAVENOUS
  Filled 2013-02-09: qty 2

## 2013-02-09 MED ORDER — CHLORHEXIDINE GLUCONATE 0.12 % MT SOLN
15.0000 mL | Freq: Two times a day (BID) | OROMUCOSAL | Status: DC
  Administered 2013-02-09 – 2013-02-11 (×5): 15 mL via OROMUCOSAL
  Filled 2013-02-09 (×7): qty 15

## 2013-02-09 MED ORDER — BIOTENE DRY MOUTH MT LIQD
15.0000 mL | Freq: Four times a day (QID) | OROMUCOSAL | Status: DC
  Administered 2013-02-10 – 2013-02-13 (×9): 15 mL via OROMUCOSAL

## 2013-02-09 MED ORDER — SODIUM CHLORIDE 0.9 % IV SOLN
INTRAVENOUS | Status: DC
  Administered 2013-02-09: 19:00:00 via INTRAVENOUS

## 2013-02-09 MED ORDER — GABAPENTIN 250 MG/5ML PO SOLN
300.0000 mg | Freq: Three times a day (TID) | ORAL | Status: DC
  Administered 2013-02-09 – 2013-02-13 (×12): 300 mg
  Filled 2013-02-09 (×18): qty 6

## 2013-02-09 MED ORDER — DARBEPOETIN ALFA-POLYSORBATE 100 MCG/0.5ML IJ SOLN
100.0000 ug | INTRAMUSCULAR | Status: DC
  Filled 2013-02-09: qty 0.5

## 2013-02-09 NOTE — Progress Notes (Signed)
  ANTIBIOTIC CONSULT NOTE - FOLLOW UP  Pharmacy Consult for vancomycin and Zosyn Indication: rule out pneumonia  Allergies  Allergen Reactions  . Ambien [Zolpidem Tartrate] Other (See Comments)    Hallucinations    Patient Measurements: Height: 5\' 8"  (172.7 cm) Weight: 135 lb 12.9 oz (61.6 kg) IBW/kg (Calculated) : 68.4  Vital Signs: Temp: 98.3 F (36.8 C) (11/19 0800) Temp src: Core (Comment) (11/19 0209) BP: 85/66 mmHg (11/19 0831) Pulse Rate: 80 (11/19 0831) Intake/Output from previous day: 11/18 0701 - 11/19 0700 In: 1004.2 [I.V.:1004.2] Out: 3048  Intake/Output from this shift: Total I/O In: 1.5 [I.V.:1.5] Out: -   Labs:  Recent Labs  02/08/13 1437 02/08/13 1456 02/08/13 1903 02/09/13 0402  WBC 5.8  --  5.3 10.2  HGB 10.2* 11.6* 11.7* 10.6*  PLT 194  --  167 209  CREATININE 7.34* 7.40* 6.99* 4.94*   Estimated Creatinine Clearance: 13.2 ml/min (by C-G formula based on Cr of 4.94). No results found for this basename: VANCOTROUGH, Leodis Binet, VANCORANDOM, GENTTROUGH, GENTPEAK, GENTRANDOM, TOBRATROUGH, TOBRAPEAK, TOBRARND, AMIKACINPEAK, AMIKACINTROU, AMIKACIN,  in the last 72 hours   Microbiology: Recent Results (from the past 720 hour(s))  MRSA PCR SCREENING     Status: None   Collection Time    02/08/13  8:22 PM      Result Value Range Status   MRSA by PCR NEGATIVE  NEGATIVE Final   Comment:            The GeneXpert MRSA Assay (FDA     approved for NASAL specimens     only), is one component of a     comprehensive MRSA colonization     surveillance program. It is not     intended to diagnose MRSA     infection nor to guide or     monitor treatment for     MRSA infections.    Anti-infectives   Start     Dose/Rate Route Frequency Ordered Stop   02/09/13 0930  vancomycin (VANCOCIN) 500 mg in sodium chloride 0.9 % 100 mL IVPB     500 mg 100 mL/hr over 60 Minutes Intravenous  Once 02/09/13 0848     02/08/13 1900  piperacillin-tazobactam (ZOSYN) IVPB  2.25 g     2.25 g 100 mL/hr over 30 Minutes Intravenous 3 times per day 02/08/13 1834     02/08/13 1845  vancomycin (VANCOCIN) 1,500 mg in sodium chloride 0.9 % 500 mL IVPB     1,500 mg 250 mL/hr over 120 Minutes Intravenous  Once 02/08/13 1834 02/08/13 2300      Assessment: 62 yo AAM w/ ESRD transferred from [facility] where presented with AMS and hypotension at dialysis. Of note, also s/p multiple recent admissions for pulm edema, pleural effusions.  Patient was given a vancomycin IV 1500mg  loading dose last night prior to receiving dialysis for ~3hr at a blood flow rate that fluctuated (~300-380), requiring levophed to support blood pressure. Patient needs supplement vanc dose s/p HD.  Zosyn dose is still appropriate.  Goal of Therapy:  Vancomycin pre-HD level 15-25 mg/L  Plan:  - continue Zosyn 2.25g IV q8h - give vanc IV 500mg  x1 dose now - plan for vanc IV 500mg  or 750mg  after each HD session, depending on tolerance/length - f/u HD schedule and tolerance - f/u cultures, WBC, temp curve, and clinical progression

## 2013-02-09 NOTE — Progress Notes (Signed)
Hemodialysis Tx done. Pt ran for 3 hours and of fluid was removed as ordered. Pt was hypotensive prior to starting hemodialysis Tx and was put on a levophed gtt to support his BP during hemodialysis Tx. Pt tolerated the procedure well and goal was met.

## 2013-02-09 NOTE — Progress Notes (Signed)
Subjective: Interval History: has complaints wants tube out.  Objective: Vital signs in last 24 hours: Temp:  [95.7 F (35.4 C)-99.4 F (37.4 C)] 98.3 F (36.8 C) (11/19 0800) Pulse Rate:  [25-94] 80 (11/19 0831) Resp:  [16-28] 28 (11/19 0831) BP: (67-194)/(32-157) 85/66 mmHg (11/19 0831) SpO2:  [79 %-100 %] 100 % (11/19 0700) Arterial Line BP: (118-164)/(58-104) 137/104 mmHg (11/18 1900) FiO2 (%):  [40 %] 40 % (11/19 0831) Weight:  [61.6 kg (135 lb 12.9 oz)-69.088 kg (152 lb 5 oz)] 61.6 kg (135 lb 12.9 oz) (11/19 0209) Weight change:   Intake/Output from previous day: 11/18 0701 - 11/19 0700 In: 1004.2 [I.V.:1004.2] Out: 3048  Intake/Output this shift: Total I/O In: 1.5 [I.V.:1.5] Out: -   General appearance: cooperative and entub, coop Resp: rales bilaterally and rhonchi bilaterally Cardio: S1, S2 normal and systolic murmur: systolic ejection 2/6, decrescendo at 2nd left intercostal space GI: pos bs, liver down 5 cm Extremities: AVF LUA  Lab Results:  Recent Labs  02/08/13 1903 02/09/13 0402  WBC 5.3 10.2  HGB 11.7* 10.6*  HCT 38.0* 34.0*  PLT 167 209   BMET:  Recent Labs  02/08/13 1437 02/08/13 1456 02/08/13 1903 02/09/13 0402  NA 139 141  --  139  K 3.4* 3.0*  --  4.3  CL 98 101  --  98  CO2 29  --   --  25  GLUCOSE 94 94  --  130*  BUN 32* 31*  --  21  CREATININE 7.34* 7.40* 6.99* 4.94*  CALCIUM 9.6  --   --  9.4   No results found for this basename: PTH,  in the last 72 hours Iron Studies: No results found for this basename: IRON, TIBC, TRANSFERRIN, FERRITIN,  in the last 72 hours  Studies/Results: Ct Head Wo Contrast  02/08/2013   CLINICAL DATA:  Encephalopathy. Shortness of breath. Altered mental status.  EXAM: CT HEAD WITHOUT CONTRAST  TECHNIQUE: Contiguous axial images were obtained from the base of the skull through the vertex without intravenous contrast.  COMPARISON:  09/08/2012  FINDINGS: 1.8 x 0.9 cm hypodensity in the left inferior  cerebellum compatible with remote stroke, stable.  Brainstem, thalami, and basal ganglia unremarkable. Ventricular system and basilar cisterns normal.  Confluent hypodensity in the right frontal lobe posteriorly along the sylvian fissure similar to prior exam and likely representing a combination of remote CVA and chronic ischemic microvascular white matter disease. Suspected remote small stroke in the left frontal lobe, stable, with small punctate dystrophic calcification.  Fluid density in the posterior nasopharynx noted. No intracranial hemorrhage, mass lesion, or acute CVA. Atherosclerotic calcification of the carotid siphons noted.  IMPRESSION: 1. Remote strokes in the frontal lobes and left cerebellum. 2. Periventricular white matter and corona radiata hypodensities favor chronic ischemic microvascular white matter disease. 3. No acute intracranial findings.   Electronically Signed   By: Herbie Baltimore M.D.   On: 02/08/2013 19:57   Dg Chest Port 1 View  02/08/2013   CLINICAL DATA:  Endotracheal tube placement  EXAM: PORTABLE CHEST - 1 VIEW  COMPARISON:  Radiograph 02/08/2013  FINDINGS: Endotracheal tube is 4.3 cm from carinal. Stable enlarged cardiac silhouette. There is bilateral perihilar airspace disease. There are bilateral pleural effusions.  IMPRESSION: Endotracheal tube in good position.  Bilateral airspace disease and pleural fluid suggests pulmonary edema / congestive heart failure.   Electronically Signed   By: Genevive Bi M.D.   On: 02/08/2013 19:02   Dg Chest [facility]  1 View  02/08/2013   CLINICAL DATA:  Shortness of breath.  Unresponsive.  EXAM: PORTABLE CHEST - 1 VIEW  COMPARISON:  02/07/2013.  FINDINGS: Persistent prominent bilateral pleural effusions present. Interstitial prominence noted on today's exam suggesting possible component of congestive heart failure. Cardiomegaly. Stable vascular calcification noted. No pneumothorax.  IMPRESSION: 1. Persistent prominent bilateral pleural  effusions. 2. New onset of pulmonary interstitial edema bilaterally suggesting a component of congestive heart failure.   Electronically Signed   By: Maisie Fus  Register   On: 02/08/2013 14:43    I have reviewed the patient's current medications.  Assessment/Plan: 1 ESRD got off < 3liter and bp low.  Will do HD in am. 2 Resp failure vol and suspect asp pneu 3 anemia address 4 HPTH 5 PVD 6 CAD P HD, epo ,fe, AB    LOS: 1 day   Talley Kreiser,Zyan L 02/09/2013,10:00 AM

## 2013-02-09 NOTE — Progress Notes (Signed)
Call placed to [facility] at this time.  Left message with Jola Babinski, Nurse for [facility].  Pt bradycardic in mid 50's.  Pt continues to maintain good BP at this this.  Will monitor.

## 2013-02-09 NOTE — Progress Notes (Addendum)
PULMONARY  / CRITICAL CARE MEDICINE  Name: [name] MRN: [id number] DOB: 12-Jul-1950    ADMISSION DATE:  02/08/2013 CONSULTATION DATE:  02/08/13  REFERRING MD :  Dr Rubin Payor PRIMARY SERVICE: PCCM  CHIEF COMPLAINT:  Altered mental status  BRIEF PATIENT DESCRIPTION: 62 year old AAM with HLD, Arthritis, Claudication, Chronic Anemia, CHF, PVD, HTN, Dyspnea, Cataract, ESRD on Dialysis, PUD, Stroke, GERD, and bilateral BKA evaluated in the ED for AMS and intubated with lack of airway protection. Recently d/c'ed from [facility]. Currently hypotensive in HD. Also question HCAP vs CHF.  SIGNIFICANT EVENTS / STUDIES:  11/18 Admitted for AMS, intubated due to resp failure and inability to protect airway 11/18 started on empiric HCAP cvg with vanc/zosyn, blood and sputum cultured 11/19 2AM urgent HD done for fluid overload, ~3L removed  LINES / TUBES: Left External Jugular IV 02/08/13 >>  Left Femoral A and Central Line 02/08/13 >>  CULTURES: 11/18 blood culture>> 11/18 sputum culture>>  ANTIBIOTICS: 11/18 vancomycin (empiric) >> 11/18 zosyn (empiric) >>  SUBJECTIVE: Minimally follows commands, slightly agitated.  VITAL SIGNS: Temp:  [95.7 F (35.4 C)-99.4 F (37.4 C)] 98.3 F (36.8 C) (11/19 0800) Pulse Rate:  [25-94] 80 (11/19 0831) Resp:  [16-28] 28 (11/19 0831) BP: (67-194)/(32-157) 85/66 mmHg (11/19 0831) SpO2:  [79 %-100 %] 100 % (11/19 0700) Arterial Line BP: (118-164)/(58-104) 137/104 mmHg (11/18 1900) FiO2 (%):  [40 %] 40 % (11/19 0831) Weight:  [135 lb 12.9 oz (61.6 kg)-152 lb 5 oz (69.088 kg)] 135 lb 12.9 oz (61.6 kg) (11/19 0209) HEMODYNAMICS:   VENTILATOR SETTINGS: Vent Mode:  [-] PRVC FiO2 (%):  [40 %] 40 % Set Rate:  [16 bmp] 16 bmp Vt Set:  [500 mL] 500 mL PEEP:  [5 cmH20-8 cmH20] 5 cmH20 Pressure Support:  [8 cmH20] 8 cmH20 Plateau Pressure:  [28 cmH20-37 cmH20] 37 cmH20 INTAKE / OUTPUT: Intake/Output     11/18 0701 - 11/19 0700 11/19 0701 -  11/20 0700   I.V. (mL/kg) 1004.2 (16.3) 1.5 (0)   Total Intake(mL/kg) 1004.2 (16.3) 1.5 (0)   Other 3048    Total Output 3048     Net -2043.8 +1.5          PHYSICAL EXAMINATION: General:  Awake, no distress Neuro:  Awake, minimally follows commands, moves all extremities spontaneously and nonpurposefully, RASS 1 HEENT:  AT/[place], sclera icteric Cardiovascular:  Mild tachycardia Lungs:  Coarse bilaterally, ETT in place, occasional cough Abdomen:  Soft, nontender Musculoskeletal:  Left mid-ray amputation, right great toe amputation, no LE edema Skin:  No rash or cyanosis, skin dry  LABS:   Recent Labs Lab 02/08/13 1437 02/08/13 1456 02/08/13 1903 02/09/13 0402  NA 139 141  --  139  K 3.4* 3.0*  --  4.3  CL 98 101  --  98  CO2 29  --   --  25  GLUCOSE 94 94  --  130*  BUN 32* 31*  --  21  CREATININE 7.34* 7.40* 6.99* 4.94*  CALCIUM 9.6  --   --  9.4  MG  --   --   --  2.1  PHOS  --   --   --  4.2    Recent Labs Lab 02/08/13 1437 02/08/13 1456 02/08/13 1903 02/09/13 0402  HGB 10.2* 11.6* 11.7* 10.6*  HCT 33.7* 34.0* 38.0* 34.0*  WBC 5.8  --  5.3 10.2  PLT 194  --  167 209    Recent Labs Lab 02/08/13 1437  02/09/13 0402  AST 14 16  ALT 8 7  ALKPHOS 85 87  BILITOT 0.7 0.8  PROT 7.7 8.0  ALBUMIN 2.8* 2.8*  INR  --  1.12     Recent Labs Lab 02/08/13 2100 02/09/13 0205  TROPONINI <0.30 <0.30    Recent Labs Lab 02/08/13 2005 02/08/13 2007 02/08/13 2359 02/09/13 0415 02/09/13 0758  GLUCAP <10* 122* 106* 122* 104*    CXR: 11/19: Little change in effusions and pulmonary edema  ASSESSMENT / PLAN:  PULMONARY A: Acute Respiratory failure, VDRF Pulmonary Edema, acute on effusions  B infiltrates, Likely volume overload with ESRD, CHF, and unknown recent cardiac function; Possible HCAP  Chronic thoracic lymphadenopathy, unclear etiology P:  Nephrology consulted and dialyzed for fluid overload, -3000cc 11/18; continue negative balance Follow ABGs,  normal 11/18. Keep fio2 at 40%, paO2 wnl. May need pleural [place] and thora. These are chronic, but thora may facilitate vent weaning  CARDIOVASCULAR  A: CHF and HTN on history, r/o ischemia, appears euvolemic on adm  Pulmonary secretions on intubation Borderline hypotension P:  Hold home Hydralazine and antihypertensive  Repeat echo; Normal in June with EF 50-55%, no diastolic dysfunction  RENAL  A: ESRD on Dialysis,  hypoK  P:  Nephrology consulted, appreciate their help Holding ESRD home medication until extubation  PhosLo, Sensipar, Renal Tabs, Fosrenol  Supplemented K, will be conservative  GASTROINTESTINAL  A: GERD  P:  IV protonix NPO  HEMATOLOGIC  A: Chronic Normocytic Anemia , Likely secondary to ESRD  P: Check CBC intermittently EPO if necessary; defer to nephrology  INFECTIOUS  A: Possible HCAP based on CXR, no sputum, leukocytosis P:  Intermittent CXR Follow blood and sputum cx Consider d/c nosocomial coverage quickly in absence clear PNA  ENDOCRINE  A: No Documented Problem  P: Consider ssi   NEUROLOGIC  A: Severe Encephalopathy  Likely uremic since ESRD, ruling out HCAP and CHF  P:  S/p intubation in ED and on ventilator support  On Fentanyl drip to rass -2  Leona Singleton, MD Family Practice PGY2 02/09/2013 9:19 AM  TODAY'S SUMMARY:  Continue supporting on vent while encephalopathic, continue Vanc/Zosyn for suspected HCAP and follow blood culture and sputum culture.   I have personally obtained a history, examined the patient, evaluated laboratory and imaging results, formulated the assessment and plan and placed orders. CRITICAL CARE: The patient is critically ill with multiple organ systems failure and requires high complexity decision making for assessment and support, frequent evaluation and titration of therapies, application of advanced monitoring technologies and extensive interpretation of multiple databases. Critical Care Time  devoted to patient care services described in this note is 35  minutes.   Levy Pupa, MD, PhD 02/09/2013, 2:23 PM [facility] [phone] or if no answer [phone]

## 2013-02-09 NOTE — Progress Notes (Signed)
Echocardiogram 2D Echocardiogram has been performed.  Darrol, [place] 02/09/2013, 4:05 PM

## 2013-02-09 NOTE — Significant Event (Signed)
Pt noted to have HR in 40's and 50's while asleep.  Maintaining blood pressure.  HR increases when pt is woken up.  Will monitor hemodynamics.  Coralyn Helling, MD 02/09/2013, 11:51 PM

## 2013-02-10 ENCOUNTER — Inpatient Hospital Stay (HOSPITAL_COMMUNITY): Payer: Medicare Other

## 2013-02-10 DIAGNOSIS — Z992 Dependence on renal dialysis: Secondary | ICD-10-CM

## 2013-02-10 LAB — CBC
HCT: 28.1 % — ABNORMAL LOW (ref 39.0–52.0)
Hemoglobin: 9.2 g/dL — ABNORMAL LOW (ref 13.0–17.0)
MCH: 27.5 pg (ref 26.0–34.0)
MCHC: 32.7 g/dL (ref 30.0–36.0)
MCV: 84.1 fL (ref 78.0–100.0)
Platelets: 139 10*3/uL — ABNORMAL LOW (ref 150–400)
RBC: 3.34 MIL/uL — ABNORMAL LOW (ref 4.22–5.81)

## 2013-02-10 LAB — COMPREHENSIVE METABOLIC PANEL
ALT: 6 U/L (ref 0–53)
Alkaline Phosphatase: 67 U/L (ref 39–117)
BUN: 42 mg/dL — ABNORMAL HIGH (ref 6–23)
CO2: 21 mEq/L (ref 19–32)
Calcium: 9.3 mg/dL (ref 8.4–10.5)
Chloride: 99 mEq/L (ref 96–112)
Creatinine, Ser: 6.54 mg/dL — ABNORMAL HIGH (ref 0.50–1.35)
GFR calc Af Amer: 9 mL/min — ABNORMAL LOW (ref >90)
GFR calc non Af Amer: 8 mL/min — ABNORMAL LOW (ref >90)
Glucose, Bld: 104 mg/dL — ABNORMAL HIGH (ref 70–99)
Potassium: 4.5 mEq/L (ref 3.5–5.1)
Total Bilirubin: 0.7 mg/dL (ref 0.3–1.2)

## 2013-02-10 LAB — GLUCOSE, CAPILLARY
Glucose-Capillary: 106 mg/dL — ABNORMAL HIGH (ref 70–99)
Glucose-Capillary: 106 mg/dL — ABNORMAL HIGH (ref 70–99)
Glucose-Capillary: 91 mg/dL (ref 70–99)
Glucose-Capillary: 98 mg/dL (ref 70–99)

## 2013-02-10 MED ORDER — SODIUM CHLORIDE 0.9 % IV SOLN
125.0000 mg | INTRAVENOUS | Status: DC
  Administered 2013-02-10 – 2013-02-14 (×2): 125 mg via INTRAVENOUS
  Filled 2013-02-10 (×5): qty 10

## 2013-02-10 MED ORDER — SODIUM CHLORIDE 0.9 % IV SOLN: 100.0000 mL | INTRAVENOUS | Status: DC | PRN

## 2013-02-10 MED ORDER — LIDOCAINE-PRILOCAINE 2.5-2.5 % EX CREA: 1.0000 "application " | TOPICAL_CREAM | CUTANEOUS | Status: DC | PRN

## 2013-02-10 MED ORDER — PENTAFLUOROPROP-TETRAFLUOROETH EX AERO: 1.0000 "application " | INHALATION_SPRAY | CUTANEOUS | Status: DC | PRN

## 2013-02-10 MED ORDER — HEPARIN SODIUM (PORCINE) 1000 UNIT/ML DIALYSIS
1000.0000 [IU] | INTRAMUSCULAR | Status: DC | PRN
  Filled 2013-02-10: qty 1

## 2013-02-10 MED ORDER — ALTEPLASE 2 MG IJ SOLR
2.0000 mg | Freq: Once | INTRAMUSCULAR | Status: AC | PRN
  Filled 2013-02-10: qty 2

## 2013-02-10 MED ORDER — HEPARIN SODIUM (PORCINE) 1000 UNIT/ML DIALYSIS
100.0000 [IU]/kg | INTRAMUSCULAR | Status: DC | PRN
  Filled 2013-02-10: qty 7

## 2013-02-10 MED ORDER — ALPRAZOLAM 0.5 MG PO TABS
0.5000 mg | ORAL_TABLET | Freq: Two times a day (BID) | ORAL | Status: DC | PRN
  Administered 2013-02-10 – 2013-02-14 (×5): 0.5 mg via ORAL
  Filled 2013-02-10 (×5): qty 1

## 2013-02-10 MED ORDER — NEPRO/CARBSTEADY PO LIQD: 237.0000 mL | ORAL | Status: DC | PRN

## 2013-02-10 MED ORDER — LIDOCAINE HCL (PF) 1 % IJ SOLN: 5.0000 mL | INTRAMUSCULAR | Status: DC | PRN

## 2013-02-10 MED ORDER — DARBEPOETIN ALFA-POLYSORBATE 150 MCG/0.3ML IJ SOLN
150.0000 ug | INTRAMUSCULAR | Status: DC
  Administered 2013-02-10: 150 ug via INTRAVENOUS
  Filled 2013-02-10 (×2): qty 0.3

## 2013-02-10 MED ORDER — ALTEPLASE 2 MG IJ SOLR
2.0000 mg | Freq: Once | INTRAMUSCULAR | Status: DC | PRN
  Filled 2013-02-10: qty 2

## 2013-02-10 MED ORDER — PANTOPRAZOLE SODIUM 40 MG PO TBEC
40.0000 mg | DELAYED_RELEASE_TABLET | Freq: Every day | ORAL | Status: DC
  Administered 2013-02-10 – 2013-02-14 (×5): 40 mg via ORAL
  Filled 2013-02-10 (×5): qty 1

## 2013-02-10 NOTE — Progress Notes (Signed)
PULMONARY  / CRITICAL CARE MEDICINE  Name: [name] MRN: [id number] DOB: Jun 18, 1950    ADMISSION DATE:  02/08/2013 CONSULTATION DATE:  02/08/13  REFERRING MD :  Dr Rubin Payor PRIMARY SERVICE: PCCM  CHIEF COMPLAINT:  Altered mental status  BRIEF PATIENT DESCRIPTION: 62 year old AAM with HLD, Arthritis, Chronic Anemia, CHF, PVD, HTN, Cataract, ESRD on Dialysis, PUD/GERD, Stroke, and bilateral BKA evaluated in the ED for AMS and intubated with lack of airway protection. Recently d/[facility] from [facility]. Also question HCAP vs CHF.  SIGNIFICANT EVENTS / STUDIES:  11/18 Admitted for AMS, intubated due to resp failure and inability to protect airway 11/18 started on empiric HCAP cvg with vanc/zosyn, blood and sputum cultured 11/19 2AM urgent HD done for fluid overload, ~3L removed, pt became hypotensive.  11/20 Plan repeat HD for persistent fluid overload  LINES / TUBES: Left External Jugular IV 02/08/13 >>  Left Femoral A and Central Line 02/08/13 >> ETT 02/09/13>> 11/20 OG tube 02/09/13>> 11/20  CULTURES: 11/18 blood culture>> 11/18 sputum culture>>  ANTIBIOTICS: 11/18 vancomycin (empiric) >> 11/18 zosyn (empiric) >>  SUBJECTIVE: Self-extubated this morning, doing well.  VITAL SIGNS: Temp:  [97.6 F (36.4 C)-99 F (37.2 C)] 99 F (37.2 C) (11/20 0401) Pulse Rate:  [52-80] 55 (11/20 0300) Resp:  [16-28] 16 (11/20 0700) BP: (62-124)/(33-66) 118/49 mmHg (11/20 0300) SpO2:  [94 %-96 %] 94 % (11/20 0400) FiO2 (%):  [40 %] 40 % (11/20 0400) Weight:  [138 lb 10.7 oz (62.9 kg)] 138 lb 10.7 oz (62.9 kg) (11/20 0400) HEMODYNAMICS: CVP:  [4 mmHg-7 mmHg] 5 mmHg VENTILATOR SETTINGS: Vent Mode:  [-] PRVC FiO2 (%):  [40 %] 40 % Set Rate:  [16 bmp] 16 bmp Vt Set:  [500 mL] 500 mL PEEP:  [5 cmH20] 5 cmH20 Plateau Pressure:  [36 cmH20-40 cmH20] 40 cmH20 INTAKE / OUTPUT: Intake/Output     11/19 0701 - 11/20 0700 11/20 0701 - 11/21 0700   I.V. (mL/kg) 177 (2.8)    NG/GT  230    IV Piggyback 350    Total Intake(mL/kg) 757 (12)    Urine (mL/kg/hr) 5 (0)    Other     Total Output 5     Net +752            PHYSICAL EXAMINATION:  General:  Awake, no distress Neuro:  Awake, follows commands, moves all extremities spontaneously, RASS 0 HEENT:  AT/[place] Cardiovascular:  Distant heart sounds Lungs:  CTA left, decreased sounds right, normal WOB Abdomen:  Soft, nontender Musculoskeletal:  Left mid-ray amputation, right great toe amputation, no LE edema Skin:  No rash or cyanosis, skin dry  LABS:   Recent Labs Lab 02/08/13 1437 02/08/13 1456 02/08/13 1903 02/09/13 0402 02/10/13 0405  NA 139 141  --  139 138  K 3.4* 3.0*  --  4.3 4.5  CL 98 101  --  98 99  CO2 29  --   --  25 21  GLUCOSE 94 94  --  130* 104*  BUN 32* 31*  --  21 42*  CREATININE 7.34* 7.40* 6.99* 4.94* 6.54*  CALCIUM 9.6  --   --  9.4 9.3  MG  --   --   --  2.1  --   PHOS  --   --   --  4.2 6.2*    Recent Labs Lab 02/08/13 1903 02/09/13 0402 02/10/13 0405  HGB 11.7* 10.6* 9.2*  HCT 38.0* 34.0* 28.1*  WBC 5.3 10.2  5.6  PLT 167 209 139*    Recent Labs Lab 02/08/13 1437 02/09/13 0402 02/10/13 0405  AST 14 16 10   ALT 8 7 6   ALKPHOS 85 87 67  BILITOT 0.7 0.8 0.7  PROT 7.7 8.0 6.9  ALBUMIN 2.8* 2.8* 2.6*  INR  --  1.12  --       Recent Labs Lab 02/08/13 2100 02/09/13 0205  TROPONINI <0.30 <0.30    Recent Labs Lab 02/09/13 1145 02/09/13 1522 02/09/13 1943 02/10/13 0004 02/10/13 0359  GLUCAP 80 103* 98 106* 97    CXR: 11/20: Pleural effusions and pulmonary edema unchanged/mildly worsened.  ASSESSMENT / PLAN:  PULMONARY A: Acute Respiratory failure, VDRF>>self-extubated this AM Acute Pulmonary Edema on chronic effusions  B infiltrates, likely volume overload with ESRD; CHF less likely cause with EF 50-55% Possible HCAP  Chronic thoracic lymphadenopathy, unclear etiology Apparent R rounded atelectasis (noted on past films), consider occult  malignancy P:  Nephrology consulted and dialyzed for fluid overload, -3000cc 11/19 early AM; continue negative balance with plans for HD again today Keep fio2 at 40%, paO2 wnl. May need pleural [place] and thora. These are chronic, but thora may facilitate resp improvement Currently doing well on room air, unable to obtain O2 sat but retry if pt develops respiratory distress and may need to reintubate Will need surveillance CT's for both thoracic LAD and also his apparent R rounded atx. No clear indication to pursue bx at this time  CARDIOVASCULAR  A: CHF and HTN on history, r/o ischemia Pulmonary secretions on intubation Borderline hypotension EF unchanged since June 50-55%, no diastolic dysfunction Bradycardia to 50s P:  Hold home antihypertensives  RENAL  A: ESRD on Dialysis HypoK, resolved HPTH P:  Nephrology consulted, appreciate their help Holding ESRD home medication until extubation  PhosLo, Sensipar, Renal Tabs, Fosrenol  Return to HD today 11/20 Follow HPTH, give phos binder once POing  GASTROINTESTINAL  A: GERD  P:  IV protonix to PO Start renal diet  HEMATOLOGIC  A: Chronic Normocytic Anemia , Likely secondary to ESRD  Sat ratio 12% P: Check CBC intermittently IV Fe and EPO per renal  INFECTIOUS  A: Possible HCAP based on CXR, no sputum, leukocytosis P:  Intermittent CXR Follow blood and sputum cx Consider d/c nosocomial coverage quickly in absence clear PNA  ENDOCRINE  A: No Documented Problem  P: Consider ssi if >180 persistently  NEUROLOGIC  A: Severe Encephalopathy  Likely uremic since ESRD, ruling out HCAP and CHF  P:  Follow   Leona Singleton, MD Family Practice PGY2 02/10/2013 7:13 AM  TODAY'S SUMMARY:    I have personally obtained a history, examined the patient, evaluated laboratory and imaging results, formulated the assessment and plan and placed orders. CRITICAL CARE: The patient is critically ill with multiple organ systems  failure and requires high complexity decision making for assessment and support, frequent evaluation and titration of therapies, application of advanced monitoring technologies and extensive interpretation of multiple databases. Critical Care Time devoted to patient care services described in this note is 35  minutes.   Levy Pupa, MD, PhD 02/10/2013, 9:35 AM [place] [facility] [phone] or if no answer [phone]

## 2013-02-10 NOTE — Progress Notes (Signed)
Pt self-extubated at 0730 before even assessing the pt. Respiratory called, MD made aware. Pt is oriented, following commands and oxygenating adequately.

## 2013-02-10 NOTE — Progress Notes (Signed)
02/10/2013 patient transfer from 2 [place] to 6700 at 1745. He is alert, oriented. Patient stated he lives alone a home and normally ambulated. 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 His foot is dry and cracked and his sacrum have a old wound, it is pink with little white area. [facility] RN.

## 2013-02-10 NOTE — Procedures (Signed)
I was present at this session.  I have reviewed the session itself and made appropriate changes.  prob with machine delayed.  bp marginal, but xs vol, get as much as poss. Access Lua avf ok..  Milind Raether,Brenon L 11/20/201412:33 PM

## 2013-02-10 NOTE — Progress Notes (Signed)
Subjective: Interval History: has complaints wants tube out.  Objective: Vital signs in last 24 hours: Temp:  [97.6 F (36.4 C)-99.1 F (37.3 C)] 99 F (37.2 C) (11/20 0401) Pulse Rate:  [52-94] 55 (11/20 0300) Resp:  [16-28] 20 (11/20 0500) BP: (62-124)/(33-68) 118/49 mmHg (11/20 0300) SpO2:  [94 %-100 %] 94 % (11/20 0400) FiO2 (%):  [40 %] 40 % (11/20 0400) Weight:  [62.9 kg (138 lb 10.7 oz)] 62.9 kg (138 lb 10.7 oz) (11/20 0400) Weight change: -6.188 kg (-13 lb 10.3 oz)  Intake/Output from previous day: 11/19 0701 - 11/20 0700 In: 757 [I.V.:177; NG/GT:230; IV Piggyback:350] Out: 5 [Urine:5] Intake/Output this shift: Total I/O In: 342.5 [I.V.:152.5; NG/GT:90; IV Piggyback:100] Out: -   General appearance: alert, cooperative and entub but cooperative and obeys commands Resp: diminished breath sounds bilaterally and rhonchi bilaterally Cardio: S1, S2 normal and systolic murmur: holosystolic 2/6, blowing at apex GI: pos bs, soft,liver down 5 cm Extremities: edema 1+, avf LUA, B&T  Lab Results:  Recent Labs  02/09/13 0402 02/10/13 0405  WBC 10.2 5.6  HGB 10.6* 9.2*  HCT 34.0* 28.1*  PLT 209 139*   BMET:  Recent Labs  02/09/13 0402 02/10/13 0405  NA 139 138  K 4.3 4.5  CL 98 99  CO2 25 21  GLUCOSE 130* 104*  BUN 21 42*  CREATININE 4.94* 6.54*  CALCIUM 9.4 9.3   No results found for this basename: PTH,  in the last 72 hours Iron Studies:  Recent Labs  02/09/13 1030  IRON 16*  TIBC 139*    Studies/Results: Ct Head Wo Contrast  02/08/2013   CLINICAL DATA:  Encephalopathy. Shortness of breath. Altered mental status.  EXAM: CT HEAD WITHOUT CONTRAST  TECHNIQUE: Contiguous axial images were obtained from the base of the skull through the vertex without intravenous contrast.  COMPARISON:  09/08/2012  FINDINGS: 1.8 x 0.9 cm hypodensity in the left inferior cerebellum compatible with remote stroke, stable.  Brainstem, thalami, and basal ganglia unremarkable.  Ventricular system and basilar cisterns normal.  Confluent hypodensity in the right frontal lobe posteriorly along the sylvian fissure similar to prior exam and likely representing a combination of remote CVA and chronic ischemic microvascular white matter disease. Suspected remote small stroke in the left frontal lobe, stable, with small punctate dystrophic calcification.  Fluid density in the posterior nasopharynx noted. No intracranial hemorrhage, mass lesion, or acute CVA. Atherosclerotic calcification of the carotid siphons noted.  IMPRESSION: 1. Remote strokes in the frontal lobes and left cerebellum. 2. Periventricular white matter and corona radiata hypodensities favor chronic ischemic microvascular white matter disease. 3. No acute intracranial findings.   Electronically Signed   By: Herbie Baltimore M.D.   On: 02/08/2013 19:57   Dg Chest Port 1 View  02/09/2013   CLINICAL DATA:  Respiratory failure.  EXAM: PORTABLE CHEST - 1 VIEW  COMPARISON:  February 08, 2013.  FINDINGS: Endotracheal tube is in grossly good position with distal tip 5 cm above the carina. Bilateral pleural effusions are stable with left larger than right. No pneumothorax is noted. Degenerative joint disease of both glenohumeral joints is noted.  IMPRESSION: Stable bilateral pleural effusions with left greater than right. Endotracheal tube in grossly good position.   Electronically Signed   By: Roque Lias M.D.   On: 02/09/2013 12:14   Dg Chest Port 1 View  02/08/2013   CLINICAL DATA:  Endotracheal tube placement  EXAM: PORTABLE CHEST - 1 VIEW  COMPARISON:  Radiograph 02/08/2013  FINDINGS: Endotracheal tube is 4.3 cm from carinal. Stable enlarged cardiac silhouette. There is bilateral perihilar airspace disease. There are bilateral pleural effusions.  IMPRESSION: Endotracheal tube in good position.  Bilateral airspace disease and pleural fluid suggests pulmonary edema / congestive heart failure.   Electronically Signed   By:  Genevive Bi M.D.   On: 02/08/2013 19:02   Dg Chest Port 1 View  02/08/2013   CLINICAL DATA:  Shortness of breath.  Unresponsive.  EXAM: PORTABLE CHEST - 1 VIEW  COMPARISON:  02/07/2013.  FINDINGS: Persistent prominent bilateral pleural effusions present. Interstitial prominence noted on today's exam suggesting possible component of congestive heart failure. Cardiomegaly. Stable vascular calcification noted. No pneumothorax.  IMPRESSION: 1. Persistent prominent bilateral pleural effusions. 2. New onset of pulmonary interstitial edema bilaterally suggesting a component of congestive heart failure.   Electronically Signed   By: Maisie Fus  Register   On: 02/08/2013 14:43    I have reviewed the patient's current medications.  Assessment/Plan: 1  ESRD vol xs. Do Hd , hopefully to hlp extub.  Bp Soft. ??infx 2 Anemia Fe low , give IV. And use epo 3 Resp failure  Vol but suspect asp process with pneumonitis.   4 Pneumonia 5 HPTH Pending 6 ^ phos give binder oncetakin pos 7 Arrthymias stabel 8 PVD P HD, epo, fe, AB hopefully wean    LOS: 2 days   Jewell Haught,Zebulon L 02/10/2013,6:51 AM

## 2013-02-10 NOTE — Procedures (Signed)
Extubation Procedure Note  Patient Details:   Name: [name] DOB: Feb 26, 1951 MRN: [id number]  Pt self-extubated, VS WNL, pt able to vocalize, no stridor noted, tolerating well at this time. RT will continue to monitor.    Evaluation  O2 sats: stable throughout Complications: No apparent complications Patient did tolerate procedure well. Bilateral Breath Sounds: Rhonchi   Yes  Harley Hallmark 02/10/2013, 7:50 AM

## 2013-02-11 ENCOUNTER — Inpatient Hospital Stay (HOSPITAL_COMMUNITY): Payer: Medicare Other

## 2013-02-11 DIAGNOSIS — N189 Chronic kidney disease, unspecified: Secondary | ICD-10-CM

## 2013-02-11 DIAGNOSIS — D631 Anemia in chronic kidney disease: Secondary | ICD-10-CM

## 2013-02-11 LAB — CBC
HCT: 34.9 % — ABNORMAL LOW (ref 39.0–52.0)
MCV: 88.8 fL (ref 78.0–100.0)
RBC: 3.93 MIL/uL — ABNORMAL LOW (ref 4.22–5.81)
WBC: 6.1 10*3/uL (ref 4.0–10.5)

## 2013-02-11 LAB — GLUCOSE, CAPILLARY
Glucose-Capillary: 125 mg/dL — ABNORMAL HIGH (ref 70–99)
Glucose-Capillary: 99 mg/dL (ref 70–99)

## 2013-02-11 LAB — RENAL FUNCTION PANEL
BUN: 28 mg/dL — ABNORMAL HIGH (ref 6–23)
CO2: 27 mEq/L (ref 19–32)
Chloride: 98 mEq/L (ref 96–112)
Creatinine, Ser: 4.5 mg/dL — ABNORMAL HIGH (ref 0.50–1.35)
GFR calc non Af Amer: 13 mL/min — ABNORMAL LOW (ref >90)
Glucose, Bld: 120 mg/dL — ABNORMAL HIGH (ref 70–99)
Potassium: 3.8 mEq/L (ref 3.5–5.1)

## 2013-02-11 MED ORDER — LIDOCAINE HCL (PF) 1 % IJ SOLN: 5.0000 mL | INTRAMUSCULAR | Status: DC | PRN

## 2013-02-11 MED ORDER — SODIUM CHLORIDE 0.9 % IV SOLN: 100.0000 mL | INTRAVENOUS | Status: DC | PRN

## 2013-02-11 MED ORDER — NEPRO/CARBSTEADY PO LIQD: 237.0000 mL | ORAL | Status: DC | PRN

## 2013-02-11 MED ORDER — PENTAFLUOROPROP-TETRAFLUOROETH EX AERO: 1.0000 "application " | INHALATION_SPRAY | CUTANEOUS | Status: DC | PRN

## 2013-02-11 MED ORDER — HYDROCORTISONE SOD SUCCINATE 100 MG IJ SOLR
25.0000 mg | Freq: Two times a day (BID) | INTRAMUSCULAR | Status: DC
  Administered 2013-02-11 – 2013-02-12 (×2): 25 mg via INTRAVENOUS
  Filled 2013-02-11 (×4): qty 0.5

## 2013-02-11 MED ORDER — HEPARIN SODIUM (PORCINE) 1000 UNIT/ML DIALYSIS: 100.0000 [IU]/kg | INTRAMUSCULAR | Status: DC | PRN

## 2013-02-11 MED ORDER — HEPARIN SODIUM (PORCINE) 1000 UNIT/ML DIALYSIS: 1000.0000 [IU] | INTRAMUSCULAR | Status: DC | PRN

## 2013-02-11 MED ORDER — ALTEPLASE 2 MG IJ SOLR
2.0000 mg | Freq: Once | INTRAMUSCULAR | Status: DC | PRN
  Filled 2013-02-11: qty 2

## 2013-02-11 MED ORDER — PENTAFLUOROPROP-TETRAFLUOROETH EX AERO
INHALATION_SPRAY | CUTANEOUS | Status: AC
  Filled 2013-02-11: qty 103.5

## 2013-02-11 MED ORDER — LIDOCAINE-PRILOCAINE 2.5-2.5 % EX CREA: 1.0000 "application " | TOPICAL_CREAM | CUTANEOUS | Status: DC | PRN

## 2013-02-11 NOTE — Progress Notes (Signed)
02/11/2013  Reported by tech patient blood pressure at 1633 was 88/29. Rn rechecked patient blood pressure, unable to get pressure reading, also did ask another nurse to checked blood pressure, but unable to get a reading. Md is aware and patient was asymptomatic. Did pass report to third shift nurse about patients blood pressure. Continue to monitor [facility] RN.

## 2013-02-11 NOTE — Procedures (Signed)
I was present at this session.  I have reviewed the session itself and made appropriate changes.  AVF LUA, lower vol. Access press ok.  Eirene Rather,Tarence L 11/21/20149:08 AM

## 2013-02-11 NOTE — Progress Notes (Signed)
[facility] PROGRESS NOTE  HERRICK HARTOG JXB:[id number] DOB: [birth year] DOA: 02/08/2013 PCP: Rudi Heap, MD  Assessment/Plan: 1. Acute resp failure - pleural effusions/?HCAP - s/p VDRF - improved with HD and abx  2. ESRD on HD/volume overload -being dialyzed per renal -chronic effusions, improved compared to october  3. HCAP -was on Vanc/Zosyn -continue Zosyn today -FU CXR -transition to PO levaquin tomorrow  4. Anemia of chronic disease - stable  5. DM -stable, SSI  Code Status: FULL Family Communication: none at bedside Disposition Plan: home tomorrow   Consultants:  PCCM  Renal  HPI/Subjective: No complaints  Objective: Filed Vitals:   02/11/13 1307  BP: 111/35  Pulse: 57  Temp: 98 F (36.7 C)  Resp: 18    Intake/Output Summary (Last 24 hours) at 02/11/13 1354 Last data filed at 02/11/13 1307  Gross per 24 hour  Intake    400 ml  Output    600 ml  Net   -200 ml   Filed Weights   02/10/13 2054 02/11/13 0900 02/11/13 1307  Weight: 58.786 kg (129 lb 9.6 oz) 58 kg (127 lb 13.9 oz) 57.5 kg (126 lb 12.2 oz)    Exam:   General:  AAOx3, no distress  Cardiovascular: S1S2/RRR  Respiratory: CTAB  Abdomen: soft, NT, BS present  Musculoskeletal: no edema c/c  Data Reviewed: Basic Metabolic Panel:  Recent Labs Lab 02/08/13 1437 02/08/13 1456 02/08/13 1903 02/09/13 0402 02/10/13 0405 02/11/13 0558  NA 139 141  --  139 138 140  K 3.4* 3.0*  --  4.3 4.5 3.8  CL 98 101  --  98 99 98  CO2 29  --   --  25 21 27   GLUCOSE 94 94  --  130* 104* 120*  BUN 32* 31*  --  21 42* 28*  CREATININE 7.34* 7.40* 6.99* 4.94* 6.54* 4.50*  CALCIUM 9.6  --   --  9.4 9.3 9.4  MG  --   --   --  2.1  --   --   PHOS  --   --   --  4.2 6.2* 5.3*   Liver Function Tests:  Recent Labs Lab 02/08/13 1437 02/09/13 0402 02/10/13 0405 02/11/13 0558  AST 14 16 10   --   ALT 8 7 6   --   ALKPHOS 85 87 67  --   BILITOT 0.7 0.8 0.7  --   PROT 7.7 8.0  6.9  --   ALBUMIN 2.8* 2.8* 2.6* 2.7*   No results found for this basename: LIPASE, AMYLASE,  in the last 168 hours No results found for this basename: AMMONIA,  in the last 168 hours CBC:  Recent Labs Lab 02/08/13 1437 02/08/13 1456 02/08/13 1903 02/09/13 0402 02/10/13 0405 02/11/13 0558  WBC 5.8  --  5.3 10.2 5.6 6.1  NEUTROABS 4.0  --   --  9.6*  --   --   HGB 10.2* 11.6* 11.7* 10.6* 9.2* 10.6*  HCT 33.7* 34.0* 38.0* 34.0* 28.1* 34.9*  MCV 88.7  --  88.2 87.9 84.1 88.8  PLT 194  --  167 209 139* 140*   Cardiac Enzymes:  Recent Labs Lab 02/08/13 2100 02/09/13 0205  TROPONINI <0.30 <0.30   BNP (last 3 results)  Recent Labs  12/25/12 1300 02/08/13 1903  PROBNP 32295.0* 27711.0*   CBG:  Recent Labs Lab 02/10/13 1131 02/10/13 1533 02/10/13 1749 02/10/13 2047 02/11/13 0755  GLUCAP 98 125* 91 106* 99    Recent Results (  from the past 240 hour(s))  CULTURE, BLOOD (SINGLE)     Status: None   Collection Time    02/08/13  7:02 PM      Result Value Range Status   Specimen Description BLOOD HAND LEFT   Final   Special Requests BOTTLES DRAWN AEROBIC ONLY 1CC   Final   Culture  Setup Time     Final   Value: 02/09/2013 01:45     Performed at [facility]   Culture     Final   Value:        BLOOD CULTURE RECEIVED NO GROWTH TO DATE CULTURE WILL BE HELD FOR 5 DAYS BEFORE ISSUING A FINAL NEGATIVE REPORT     Performed at [facility]   Report Status PENDING   Incomplete  MRSA PCR SCREENING     Status: None   Collection Time    02/08/13  8:22 PM      Result Value Range Status   MRSA by PCR NEGATIVE  NEGATIVE Final   Comment:            The GeneXpert MRSA Assay (FDA     approved for NASAL specimens     only), is one component of a     comprehensive MRSA colonization     surveillance program. It is not     intended to diagnose MRSA     infection nor to guide or     monitor treatment for     MRSA infections.     Studies: Dg Chest Port 1  View  02/10/2013   CLINICAL DATA:  Endotracheal tube position  EXAM: PORTABLE CHEST - 1 VIEW  COMPARISON:  02/09/2013  FINDINGS: Endotracheal tube in good position.  NG tube enters the stomach.  Moderately large bilateral pleural effusions and bibasilar consolidation unchanged.  Mild vascular congestion. Mild progression of bilateral airspace disease, possible edema.  IMPRESSION: Endotracheal tube in good position.  Mild progression of bilateral airspace disease suggesting pulmonary edema. Moderate bilateral effusions are unchanged.   Electronically Signed   By: Marlan Palau M.D.   On: 02/10/2013 07:35    Scheduled Meds: . antiseptic oral rinse  15 mL Mouth Rinse QID  . chlorhexidine  15 mL Mouth/Throat BID  . darbepoetin (ARANESP) injection - DIALYSIS  150 mcg Intravenous Q Thu-HD  . ferric gluconate (FERRLECIT/NULECIT) IV  125 mg Intravenous Q T,Th,Sa-HD  . gabapentin  300 mg Per Tube Q8H  . hydrocortisone sodium succinate  25 mg Intravenous Q12H  . pantoprazole  40 mg Oral Q breakfast  . pentafluoroprop-tetrafluoroeth      . piperacillin-tazobactam (ZOSYN)  IV  2.25 g Intravenous Q8H   Continuous Infusions:   Active Problems:   CKD (chronic kidney disease) stage V requiring chronic dialysis   Atrial fibrillation   Acute respiratory failure with hypoxia   ESRD on hemodialysis    Time spent:    [facility]  [facility] Pager [phone] If 7PM-7AM, please contact night-coverage at [email or web address], password [facility] 02/11/2013, 1:54 PM  LOS: 3 days

## 2013-02-11 NOTE — Progress Notes (Signed)
Subjective: Interval History: has no complaint of SOB.  Objective: Vital signs in last 24 hours: Temp:  [97.5 F (36.4 C)-98.5 F (36.9 C)] 98 F (36.7 C) (11/21 0515) Pulse Rate:  [58-69] 61 (11/21 0515) Resp:  [9-23] 19 (11/21 0515) BP: (78-121)/(34-58) 119/55 mmHg (11/21 0515) SpO2:  [94 %-95 %] 94 % (11/21 0515) Weight:  [58.786 kg (129 lb 9.6 oz)] 58.786 kg (129 lb 9.6 oz) (11/20 2054) Weight change: 0 kg (0 lb)  Intake/Output from previous day: 11/20 0701 - 11/21 0700 In: 570 [P.O.:360; I.V.:100; IV Piggyback:110] Out: 3124 [Stool:1] Intake/Output this shift:    General appearance: alert and cooperative Resp: diminished breath sounds bilaterally and wheezes bilaterally Cardio: S1, S2 normal and systolic murmur: holosystolic 2/6, blowing at apex GI: liver down 5 cm, pos bs soft Extremities: avf LUA B&T,,no edema  Lab Results:  Recent Labs  02/10/13 0405 02/11/13 0558  WBC 5.6 6.1  HGB 9.2* 10.6*  HCT 28.1* 34.9*  PLT 139* 140*   BMET:  Recent Labs  02/10/13 0405 02/11/13 0558  NA 138 140  K 4.5 3.8  CL 99 98  CO2 21 27  GLUCOSE 104* 120*  BUN 42* 28*  CREATININE 6.54* 4.50*  CALCIUM 9.3 9.4   No results found for this basename: PTH,  in the last 72 hours Iron Studies:  Recent Labs  02/09/13 1030  IRON 16*  TIBC 139*    Studies/Results: Dg Chest Port 1 View  02/10/2013   CLINICAL DATA:  Endotracheal tube position  EXAM: PORTABLE CHEST - 1 VIEW  COMPARISON:  02/09/2013  FINDINGS: Endotracheal tube in good position.  NG tube enters the stomach.  Moderately large bilateral pleural effusions and bibasilar consolidation unchanged.  Mild vascular congestion. Mild progression of bilateral airspace disease, possible edema.  IMPRESSION: Endotracheal tube in good position.  Mild progression of bilateral airspace disease suggesting pulmonary edema. Moderate bilateral effusions are unchanged.   Electronically Signed   By: Marlan Palau M.D.   On: 02/10/2013  07:35   Dg Chest Port 1 View  02/09/2013   CLINICAL DATA:  Respiratory failure.  EXAM: PORTABLE CHEST - 1 VIEW  COMPARISON:  February 08, 2013.  FINDINGS: Endotracheal tube is in grossly good position with distal tip 5 cm above the carina. Bilateral pleural effusions are stable with left larger than right. No pneumothorax is noted. Degenerative joint disease of both glenohumeral joints is noted.  IMPRESSION: Stable bilateral pleural effusions with left greater than right. Endotracheal tube in grossly good position.   Electronically Signed   By: Roque Lias M.D.   On: 02/09/2013 12:14    I have reviewed the patient's current medications.  Assessment/Plan: 1 CRF for HD.  Vol improving, cont tolower. Check CXR post to help assess need for daily HD 2 Anemia stable on med 3 Resp some bronchspasm, ???role of asp, on AB 4 HPTH 5 ^ lipids 6 COPD 7 PVD P Hd,epo, fe, CXR    LOS: 3 days   [name],[name] 02/11/2013,9:09 AM

## 2013-02-12 DIAGNOSIS — I2510 Atherosclerotic heart disease of native coronary artery without angina pectoris: Secondary | ICD-10-CM

## 2013-02-12 LAB — CBC
HCT: 37.5 % — ABNORMAL LOW (ref 39.0–52.0)
Hemoglobin: 11.2 g/dL — ABNORMAL LOW (ref 13.0–17.0)
MCH: 27 pg (ref 26.0–34.0)
MCHC: 29.9 g/dL — ABNORMAL LOW (ref 30.0–36.0)
RDW: 22.3 % — ABNORMAL HIGH (ref 11.5–15.5)

## 2013-02-12 LAB — GLUCOSE, CAPILLARY
Glucose-Capillary: 64 mg/dL — ABNORMAL LOW (ref 70–99)
Glucose-Capillary: 35 mg/dL — CL (ref 70–99)
Glucose-Capillary: 96 mg/dL (ref 70–99)
Glucose-Capillary: 38 mg/dL — CL (ref 70–99)
Glucose-Capillary: 104 mg/dL — ABNORMAL HIGH (ref 70–99)

## 2013-02-12 LAB — GLUCOSE, RANDOM: Glucose, Bld: 105 mg/dL — ABNORMAL HIGH (ref 70–99)

## 2013-02-12 LAB — BASIC METABOLIC PANEL
BUN: 22 mg/dL (ref 6–23)
CO2: 23 mEq/L (ref 19–32)
Calcium: 9.7 mg/dL (ref 8.4–10.5)
Creatinine, Ser: 3.75 mg/dL — ABNORMAL HIGH (ref 0.50–1.35)
GFR calc Af Amer: 18 mL/min — ABNORMAL LOW (ref >90)
GFR calc non Af Amer: 16 mL/min — ABNORMAL LOW (ref >90)
Glucose, Bld: 110 mg/dL — ABNORMAL HIGH (ref 70–99)
Potassium: 4.4 mEq/L (ref 3.5–5.1)

## 2013-02-12 MED ORDER — LEVOFLOXACIN 750 MG PO TABS
750.0000 mg | ORAL_TABLET | Freq: Once | ORAL | Status: AC
  Administered 2013-02-12: 750 mg via ORAL
  Filled 2013-02-12: qty 1

## 2013-02-12 MED ORDER — LEVOFLOXACIN 500 MG PO TABS
500.0000 mg | ORAL_TABLET | ORAL | Status: DC
  Administered 2013-02-14: 500 mg via ORAL
  Filled 2013-02-12: qty 1

## 2013-02-12 NOTE — Progress Notes (Signed)
ANTIBIOTIC CONSULT NOTE - FOLLOW UP  Pharmacy Consult for levaquin Indication: pneumonia  Allergies  Allergen Reactions  . Ambien [Zolpidem Tartrate] Other (See Comments)    Hallucinations    Patient Measurements: Height: 5\' 8"  (172.7 cm) Weight: 126 lb 12.2 oz (57.5 kg) IBW/kg (Calculated) : 68.4  Vital Signs: Temp: 98.1 F (36.7 C) (11/22 0815) Temp src: Oral (11/22 0815) BP: 100/52 mmHg (11/22 0815) Pulse Rate: 95 (11/22 0815) Intake/Output from previous day: 11/21 0701 - 11/22 0700 In: 120 [P.O.:120] Out: 600  Intake/Output from this shift: Total I/O In: 120 [P.O.:120] Out: 0   Labs:  Recent Labs  02/10/13 0405 02/11/13 0558 02/12/13 0359  WBC 5.6 6.1 5.0  HGB 9.2* 10.6* 11.2*  PLT 139* 140* 124*  CREATININE 6.54* 4.50* 3.75*   Estimated Creatinine Clearance: 16.2 ml/min (by C-G formula based on Cr of 3.75). No results found for this basename: VANCOTROUGH, Leodis Binet, VANCORANDOM, GENTTROUGH, GENTPEAK, GENTRANDOM, TOBRATROUGH, TOBRAPEAK, TOBRARND, AMIKACINPEAK, AMIKACINTROU, AMIKACIN,  in the last 72 hours   Microbiology: Recent Results (from the past 720 hour(s))  CULTURE, BLOOD (SINGLE)     Status: None   Collection Time    02/08/13  7:02 PM      Result Value Range Status   Specimen Description BLOOD HAND LEFT   Final   Special Requests BOTTLES DRAWN AEROBIC ONLY 1CC   Final   Culture  Setup Time     Final   Value: 02/09/2013 01:45     Performed at [facility]   Culture     Final   Value:        BLOOD CULTURE RECEIVED NO GROWTH TO DATE CULTURE WILL BE HELD FOR 5 DAYS BEFORE ISSUING A FINAL NEGATIVE REPORT     Performed at [facility]   Report Status PENDING   Incomplete  MRSA PCR SCREENING     Status: None   Collection Time    02/08/13  8:22 PM      Result Value Range Status   MRSA by PCR NEGATIVE  NEGATIVE Final   Comment:            The GeneXpert MRSA Assay (FDA     approved for NASAL specimens     only), is one component  of a     comprehensive MRSA colonization     surveillance program. It is not     intended to diagnose MRSA     infection nor to guide or     monitor treatment for     MRSA infections.    Anti-infectives   Start     Dose/Rate Route Frequency Ordered Stop   02/14/13 1030  levofloxacin (LEVAQUIN) tablet 500 mg     500 mg Oral Every 48 hours 02/12/13 1000     02/12/13 1030  levofloxacin (LEVAQUIN) tablet 750 mg     750 mg Oral  Once 02/12/13 1000     02/09/13 0930  vancomycin (VANCOCIN) 500 mg in sodium chloride 0.9 % 100 mL IVPB     500 mg 100 mL/hr over 60 Minutes Intravenous  Once 02/09/13 0848 02/09/13 1220   02/08/13 1900  piperacillin-tazobactam (ZOSYN) IVPB 2.25 g  Status:  Discontinued     2.25 g 100 mL/hr over 30 Minutes Intravenous 3 times per day 02/08/13 1834 02/12/13 0843   02/08/13 1845  vancomycin (VANCOCIN) 1,500 mg in sodium chloride 0.9 % 500 mL IVPB     1,500 mg 250 mL/hr over 120 Minutes Intravenous  Once 02/08/13 1834 02/08/13 2300      Assessment: 62 yo AAM w/ ESRD transferred from [facility] where presented with AMS and hypotension at dialysis. Of note, also s/p multiple recent admissions for pulm edema, pleural effusions.  Patient previously received vancomycin and zosyn for suspected pneumonia. Those antibiotics are to be transitioned to PO therapy today, and pharmacy is consulted to dose PO levaquin.  WBCs remain WNL, blood cultures remain NGTD, and patient is afebrile.   vancomycin 11/18 >> 11/20 Zosyn 11/18 >> 11/22 levaquin 11/22 >>  Goal of Therapy:  Eradication of infection. Appropriate Levaquin dosing in HD patient.  Plan:  - Begin levaquin 750 mg PO x1 today, then 500 mg PO q 48 hours - f/u cultures, WBC, temp curve, and clinical progression  Kristol Almanzar C. Archibald Marchetta, PharmD Clinical Pharmacist-Resident Pager: [phone] Pharmacy: [phone] 02/12/2013 10:03 AM

## 2013-02-12 NOTE — Progress Notes (Signed)
Hypoglycemic Event  CBG: 54  Treatment: 15 GM carbohydrate snack  Symptoms: None  Follow-up CBG: Time:1800  CBG Result:38  Possible Reasons for Event: Unknown  Comments/MD notified:Dr. Jomarie Longs- ordered stat blood glucose to confirm finger stick results.    [name]  Remember to initiate Hypoglycemia Order Set & complete

## 2013-02-12 NOTE — Evaluation (Signed)
Physical Therapy Evaluation Patient Details Name:  [name] MRN: [id number] DOB: 01/04/1951 Today's Date: 02/12/2013 Time: 4098-1191 PT Time Calculation (min): 14 min  PT Assessment / Plan / Recommendation History of Present Illness  Pt is a 62 y.o. male adm from home secondary to AMS.  Pt with HLD, Arthritis, Claduication, chronic anemia, CHF, PVD, HTN, Dyspnea, ESRD (on dialysis MWF), stroke, GERD, partial Lt midfoot amputation. Pt was found this time to be undergoing ARF and is being treated for HCAP.  Clinical Impression  Pt amd secondary to above. Presents with limitations in functional mobility secondary to deficits listed below (see PT problem list). Pt to benefit from skilled PT to maximize functional mobility and increase independence. Pt has caregiver for multiple hours 7days a week but is alone at night and needs to be mod I for mobility.      PT Assessment  Patient needs continued PT services    Follow Up Recommendations  No PT follow up;Supervision for mobility/OOB;Supervision - Intermittent    Does the patient have the potential to tolerate intense rehabilitation      Barriers to Discharge Inaccessible home environment pt needs to be mod I for mobility; is at home alone at night    Equipment Recommendations  None recommended by PT    Recommendations for Other Services OT consult   Frequency Min 3X/week    Precautions / Restrictions Precautions Precautions: Fall Precaution Comments: denies any recent falls Restrictions Weight Bearing Restrictions: No   Pertinent Vitals/Pain No new complaints. See VSS     Mobility  Bed Mobility Bed Mobility: Not assessed Details for Bed Mobility Assistance: pt in receliner and returned to reclienr  Transfers Transfers: Sit to Stand;Stand to Sit Sit to Stand: 4: Min guard;From chair/3-in-1;With armrests Stand to Sit: 4: Min guard;To chair/3-in-1;With armrests Details for Transfer Assistance: min guard to steady and  for safety; cues for hand placement and sequencing Ambulation/Gait Ambulation/Gait Assistance: 4: Min guard Ambulation Distance (Feet): 100 Feet Assistive device: 1 person hand held assist Ambulation/Gait Assistance Details: pt amb with handheld (A) PRN; had single episode of LOB but recovered by reaching for external object in hallway; would benefit from using RW for ambulation; slightly antalgic gt secondary to partial Lt foot ampuation ~1 year ago Gait Pattern: Decreased stance time - left;Decreased step length - right;Antalgic;Narrow base of support;Trunk flexed Gait velocity: decreased General Gait Details: pt amb on RA; able to converse throughout walking; no SOB noted Stairs: No         PT Diagnosis: Abnormality of gait  PT Problem List: Decreased strength;Decreased activity tolerance;Decreased mobility;Decreased balance;Decreased knowledge of use of DME PT Treatment Interventions: DME instruction;Gait training;Functional mobility training;Therapeutic activities;Therapeutic exercise;Balance training;Neuromuscular re-education;Patient/family education     PT Goals(Current goals can be found in the care plan section) Acute Rehab PT Goals Patient Stated Goal: to get back home PT Goal Formulation: With patient Time For Goal Achievement: 02/26/13 Potential to Achieve Goals: Good  Visit Information  Last PT Received On: 02/12/13 Assistance Needed: +1 History of Present Illness: Pt is a 62 y.o. male adm from home secondary to AMS.        Prior Functioning  Home Living Family/patient expects to be discharged to:: Private residence Living Arrangements: Alone Available Help at Discharge: Personal care attendant Type of Home: Apartment Home Access: Level entry Home Layout: One level Home Equipment: Walker - 2 wheels;Hand held shower head;Grab bars - toilet;Grab bars - tub/shower;Shower seat Prior Function Level of Independence: Needs assistance Gait /  Transfers Assistance  Needed: pt reports he ambulates with RW for long distances and without AD within house  ADL's / Homemaking Assistance Needed: has caregiver multiple hours 7days/week to help him with bathing, dressing and cooking Communication Communication: No difficulties Dominant Hand: Right    Cognition  Cognition Arousal/Alertness: Awake/alert Behavior During Therapy: WFL for tasks assessed/performed Overall Cognitive Status: Within Functional Limits for tasks assessed    Extremity/Trunk Assessment Upper Extremity Assessment Upper Extremity Assessment: Defer to OT evaluation Lower Extremity Assessment Lower Extremity Assessment: Overall WFL for tasks assessed Cervical / Trunk Assessment Cervical / Trunk Assessment: Normal   Balance Balance Balance Assessed: Yes Static Standing Balance Static Standing - Balance Support: Right upper extremity supported;No upper extremity supported;During functional activity Static Standing - Level of Assistance: 5: Stand by assistance (Min guard)  End of Session PT - End of Session Equipment Utilized During Treatment: Gait belt Activity Tolerance: Patient tolerated treatment well Patient left: in chair;with call bell/phone within reach Nurse Communication: Mobility status  GP     Donell Sievert, Winter [phone] 02/12/2013, 9:44 AM

## 2013-02-12 NOTE — Progress Notes (Signed)
Hemodialysis-Dr Schertz notified of clotted AVG. Unable to hear or feel. Cannulated and clots noted. Will return pt to room.

## 2013-02-12 NOTE — Progress Notes (Signed)
Hypoglycemic Event  CBG: 64  Treatment: 15 GM carbohydrate snack  Symptoms: None  Follow-up CBG: Time: 1250 CBG Result:48/35  Possible Reasons for Event: Unknown  Comments/MD notified:    Crystale Giannattasio Hope  Remember to initiate Hypoglycemia Order Set & complete

## 2013-02-12 NOTE — Progress Notes (Signed)
Subjective: Interval History: has no complaint, feels better.  Objective: Vital signs in last 24 hours: Temp:  [97.4 F (36.3 C)-98.2 F (36.8 C)] 98.1 F (36.7 C) (11/22 0815) Pulse Rate:  [54-95] 95 (11/22 0815) Resp:  [17-18] 18 (11/22 0815) BP: (82-111)/(29-55) 100/52 mmHg (11/22 0815) SpO2:  [94 %-100 %] 95 % (11/22 0815) Weight:  [57.5 kg (126 lb 12.2 oz)] 57.5 kg (126 lb 12.2 oz) (11/21 1307) Weight change: -4.9 kg (-10 lb 12.8 oz)  Intake/Output from previous day: 11/21 0701 - 11/22 0700 In: 120 [P.O.:120] Out: 600  Intake/Output this shift: Total I/O In: 120 [P.O.:120] Out: 0   General appearance: alert, cooperative and appears older than stated age Resp: diminished breath sounds bibasilar, dullness to percussion bibasilar, rales bibasilar and wheezes bilaterally Cardio: systolic murmur: holosystolic 2/6, blowing at apex GI: pos bs,liver down 6 cm,soft Extremities: avf  LUA , B&T  Lab Results:  Recent Labs  02/11/13 0558 02/12/13 0359  WBC 6.1 5.0  HGB 10.6* 11.2*  HCT 34.9* 37.5*  PLT 140* 124*   BMET:  Recent Labs  02/11/13 0558 02/12/13 0359  NA 140 140  K 3.8 4.4  CL 98 99  CO2 27 23  GLUCOSE 120* 110*  BUN 28* 22  CREATININE 4.50* 3.75*  CALCIUM 9.4 9.7   No results found for this basename: PTH,  in the last 72 hours Iron Studies: No results found for this basename: IRON, TIBC, TRANSFERRIN, FERRITIN,  in the last 72 hours  Studies/Results: Dg Chest 2 View  02/11/2013   CLINICAL DATA:  CHF and pneumonia  EXAM: CHEST  2 VIEW  COMPARISON:  02/10/2013  FINDINGS: There has been interval removal of the endotracheal tube and nasogastric tube. There are moderate to large bilateral pleural effusions present left-greater-than-right. These appear increased in volume from previous exam with associated decreased aeration to the lungs. Mild edema pattern is unchanged from previous exam.  IMPRESSION: 1. Persistent moderate bilateral pleural effusions and  interstitial edema. When compared with the previous exam the pleural effusions are slightly increased in the interval.   Electronically Signed   By: Signa Kell M.D.   On: 02/11/2013 16:27    I have reviewed the patient's current medications.  Assessment/Plan: 1 CRF for extra HD today. Slowly lower vol . Does not tol much off at one time. CXR xs vol 2 anemia stable epo./fe 3 HPTH 4 ??PNEU 5 AFIB NSR P HD, needs 4X/wk epo , fer    LOS: 4 days   Joelyn Lover,Ammon L 02/12/2013,11:47 AM

## 2013-02-12 NOTE — Progress Notes (Signed)
[facility] PROGRESS NOTE  [name] ZOX:[id number] DOB: [birth year] DOA: 02/08/2013 PCP: Rudi Heap, MD  Assessment/Plan: 1. Acute resp failure - pleural effusions/volume overload and ?HCAP - s/p VDRF - improved with HD and abx  2. ESRD on HD/volume overload -being dialyzed per renal -chronic effusions -CXR 11/21 without significant change -continue HD per Renal  3. HCAP -was on Vanc/Zosyn -transition from Zosyn to PO levaquin today  4. Anemia of chronic disease - stable  5. DM -stable, SSI  Ambulate, Dc tele  Code Status: FULL Family Communication: none at bedside Disposition Plan: home when euvolemic per renal   Consultants:  PCCM  Renal  HPI/Subjective: No complaints  Objective: Filed Vitals:   02/12/13 0542  BP: 96/55  Pulse: 94  Temp: 97.4 F (36.3 C)  Resp: 17    Intake/Output Summary (Last 24 hours) at 02/12/13 0845 Last data filed at 02/11/13 2100  Gross per 24 hour  Intake    120 ml  Output    600 ml  Net   -480 ml   Filed Weights   02/10/13 2054 02/11/13 0900 02/11/13 1307  Weight: 58.786 kg (129 lb 9.6 oz) 58 kg (127 lb 13.9 oz) 57.5 kg (126 lb 12.2 oz)    Exam:   General:  AAOx3, no distress  Cardiovascular: S1S2/RRR  Respiratory: CTAB  Abdomen: soft, NT, BS present  Musculoskeletal: no edema c/c  Data Reviewed: Basic Metabolic Panel:  Recent Labs Lab 02/08/13 1437 02/08/13 1456 02/08/13 1903 02/09/13 0402 02/10/13 0405 02/11/13 0558 02/12/13 0359  NA 139 141  --  139 138 140 140  K 3.4* 3.0*  --  4.3 4.5 3.8 4.4  CL 98 101  --  98 99 98 99  CO2 29  --   --  25 21 27 23   GLUCOSE 94 94  --  130* 104* 120* 110*  BUN 32* 31*  --  21 42* 28* 22  CREATININE 7.34* 7.40* 6.99* 4.94* 6.54* 4.50* 3.75*  CALCIUM 9.6  --   --  9.4 9.3 9.4 9.7  MG  --   --   --  2.1  --   --   --   PHOS  --   --   --  4.2 6.2* 5.3*  --    Liver Function Tests:  Recent Labs Lab 02/08/13 1437 02/09/13 0402  02/10/13 0405 02/11/13 0558  AST 14 16 10   --   ALT 8 7 6   --   ALKPHOS 85 87 67  --   BILITOT 0.7 0.8 0.7  --   PROT 7.7 8.0 6.9  --   ALBUMIN 2.8* 2.8* 2.6* 2.7*   No results found for this basename: LIPASE, AMYLASE,  in the last 168 hours No results found for this basename: AMMONIA,  in the last 168 hours CBC:  Recent Labs Lab 02/08/13 1437  02/08/13 1903 02/09/13 0402 02/10/13 0405 02/11/13 0558 02/12/13 0359  WBC 5.8  --  5.3 10.2 5.6 6.1 5.0  NEUTROABS 4.0  --   --  9.6*  --   --   --   HGB 10.2*  < > 11.7* 10.6* 9.2* 10.6* 11.2*  HCT 33.7*  < > 38.0* 34.0* 28.1* 34.9* 37.5*  MCV 88.7  --  88.2 87.9 84.1 88.8 90.4  PLT 194  --  167 209 139* 140* 124*  < > = values in this interval not displayed. Cardiac Enzymes:  Recent Labs Lab 02/08/13 2100 02/09/13 0205  TROPONINI <  0.30 <0.30   BNP (last 3 results)  Recent Labs  12/25/12 1300 02/08/13 1903  PROBNP 32295.0* 27711.0*   CBG:  Recent Labs Lab 02/11/13 0755 02/11/13 1402 02/11/13 1559 02/11/13 1841 02/12/13 0819  GLUCAP 99 66* 94 102* 127*    Recent Results (from the past 240 hour(s))  CULTURE, BLOOD (SINGLE)     Status: None   Collection Time    02/08/13  7:02 PM      Result Value Range Status   Specimen Description BLOOD HAND LEFT   Final   Special Requests BOTTLES DRAWN AEROBIC ONLY 1CC   Final   Culture  Setup Time     Final   Value: 02/09/2013 01:45     Performed at [facility]   Culture     Final   Value:        BLOOD CULTURE RECEIVED NO GROWTH TO DATE CULTURE WILL BE HELD FOR 5 DAYS BEFORE ISSUING A FINAL NEGATIVE REPORT     Performed at [facility]   Report Status PENDING   Incomplete  MRSA PCR SCREENING     Status: None   Collection Time    02/08/13  8:22 PM      Result Value Range Status   MRSA by PCR NEGATIVE  NEGATIVE Final   Comment:            The GeneXpert MRSA Assay (FDA     approved for NASAL specimens     only), is one component of a      comprehensive MRSA colonization     surveillance program. It is not     intended to diagnose MRSA     infection nor to guide or     monitor treatment for     MRSA infections.     Studies: Dg Chest 2 View  02/11/2013   CLINICAL DATA:  CHF and pneumonia  EXAM: CHEST  2 VIEW  COMPARISON:  02/10/2013  FINDINGS: There has been interval removal of the endotracheal tube and nasogastric tube. There are moderate to large bilateral pleural effusions present left-greater-than-right. These appear increased in volume from previous exam with associated decreased aeration to the lungs. Mild edema pattern is unchanged from previous exam.  IMPRESSION: 1. Persistent moderate bilateral pleural effusions and interstitial edema. When compared with the previous exam the pleural effusions are slightly increased in the interval.   Electronically Signed   By: Signa Kell M.D.   On: 02/11/2013 16:27    Scheduled Meds: . antiseptic oral rinse  15 mL Mouth Rinse QID  . darbepoetin (ARANESP) injection - DIALYSIS  150 mcg Intravenous Q Thu-HD  . ferric gluconate (FERRLECIT/NULECIT) IV  125 mg Intravenous Q T,Th,Sa-HD  . gabapentin  300 mg Per Tube Q8H  . pantoprazole  40 mg Oral Q breakfast   Continuous Infusions:   Active Problems:   CKD (chronic kidney disease) stage V requiring chronic dialysis   Atrial fibrillation   Acute respiratory failure with hypoxia   ESRD on hemodialysis    Time spent:    [facility]  [facility] Pager [phone] If 7PM-7AM, please contact night-coverage at [email or web address], password [facility] 02/12/2013, 8:45 AM  LOS: 4 days

## 2013-02-13 LAB — GLUCOSE, CAPILLARY
Glucose-Capillary: 92 mg/dL (ref 70–99)
Glucose-Capillary: 103 mg/dL — ABNORMAL HIGH (ref 70–99)

## 2013-02-13 LAB — CBC
HCT: 35.1 % — ABNORMAL LOW (ref 39.0–52.0)
Hemoglobin: 10.8 g/dL — ABNORMAL LOW (ref 13.0–17.0)
MCHC: 30.8 g/dL (ref 30.0–36.0)
RBC: 3.96 MIL/uL — ABNORMAL LOW (ref 4.22–5.81)
WBC: 6.1 10*3/uL (ref 4.0–10.5)

## 2013-02-13 LAB — BASIC METABOLIC PANEL
BUN: 39 mg/dL — ABNORMAL HIGH (ref 6–23)
CO2: 26 mEq/L (ref 19–32)
Chloride: 97 mEq/L (ref 96–112)
Glucose, Bld: 82 mg/dL (ref 70–99)
Potassium: 3.6 mEq/L (ref 3.5–5.1)
Sodium: 138 mEq/L (ref 135–145)

## 2013-02-13 MED ORDER — RENA-VITE PO TABS
1.0000 | ORAL_TABLET | Freq: Every day | ORAL | Status: DC
  Administered 2013-02-13: 1 via ORAL
  Filled 2013-02-13 (×2): qty 1

## 2013-02-13 NOTE — Progress Notes (Signed)
[facility] PROGRESS NOTE  [name] NWG:[id number] DOB: 16-Oct-1950 DOA: 02/08/2013 PCP: Rudi Heap, MD  Assessment/Plan: 1. Acute resp failure - pleural effusions/volume overload and ?HCAP - s/p VDRF - improved with HD and abx  2. ESRD on HD/volume overload -was being dialyzed daily per renal, 11/22 with clotted AVG-per Renal -didn't dialyze yesterday -chronic effusions -CXR 11/21 without significant change -mgt per Renal  3. HCAP -was on Vanc/Zosyn -transitioned from Zosyn to PO levaquin 11/22, continue for 2 more days  4. Anemia of chronic disease - stable  5. DM -stable, SSI  Ambulate,   Code Status: FULL Family Communication: none at bedside Disposition Plan: home when stable per renal   Consultants:  PCCM  Renal  HPI/Subjective: No complaints  Objective: Filed Vitals:   02/13/13 0735  BP: 103/70  Pulse: 80  Temp: 97.5 F (36.4 C)  Resp: 18    Intake/Output Summary (Last 24 hours) at 02/13/13 1001 Last data filed at 02/13/13 0900  Gross per 24 hour  Intake    720 ml  Output      0 ml  Net    720 ml   Filed Weights   02/11/13 1307 02/12/13 1100 02/12/13 2022  Weight: 57.5 kg (126 lb 12.2 oz) 57.5 kg (126 lb 12.2 oz) 61.2 kg (134 lb 14.7 oz)    Exam:   General:  AAOx3, no distress  Cardiovascular: S1S2/RRR  Respiratory:diminished at bases, rest clear  Abdomen: soft, NT, BS present  Musculoskeletal: no edema c/c, L arm AVG  Data Reviewed: Basic Metabolic Panel:  Recent Labs Lab 02/09/13 0402 02/10/13 0405 02/11/13 0558 02/12/13 0359 02/12/13 1945 02/13/13 0601  NA 139 138 140 140  --  138  K 4.3 4.5 3.8 4.4  --  3.6  CL 98 99 98 99  --  97  CO2 25 21 27 23   --  26  GLUCOSE 130* 104* 120* 110* 105* 82  BUN 21 42* 28* 22  --  39*  CREATININE 4.94* 6.54* 4.50* 3.75*  --  5.46*  CALCIUM 9.4 9.3 9.4 9.7  --  9.5  MG 2.1  --   --   --   --   --   PHOS 4.2 6.2* 5.3*  --   --   --    Liver Function  Tests:  Recent Labs Lab 02/08/13 1437 02/09/13 0402 02/10/13 0405 02/11/13 0558  AST 14 16 10   --   ALT 8 7 6   --   ALKPHOS 85 87 67  --   BILITOT 0.7 0.8 0.7  --   PROT 7.7 8.0 6.9  --   ALBUMIN 2.8* 2.8* 2.6* 2.7*   No results found for this basename: LIPASE, AMYLASE,  in the last 168 hours No results found for this basename: AMMONIA,  in the last 168 hours CBC:  Recent Labs Lab 02/08/13 1437  02/09/13 0402 02/10/13 0405 02/11/13 0558 02/12/13 0359 02/13/13 0601  WBC 5.8  < > 10.2 5.6 6.1 5.0 6.1  NEUTROABS 4.0  --  9.6*  --   --   --   --   HGB 10.2*  < > 10.6* 9.2* 10.6* 11.2* 10.8*  HCT 33.7*  < > 34.0* 28.1* 34.9* 37.5* 35.1*  MCV 88.7  < > 87.9 84.1 88.8 90.4 88.6  PLT 194  < > 209 139* 140* 124* 141*  < > = values in this interval not displayed. Cardiac Enzymes:  Recent Labs Lab 02/08/13 2100 02/09/13 0205  TROPONINI <0.30 <0.30   BNP (last 3 results)  Recent Labs  12/25/12 1300 02/08/13 1903  PROBNP 32295.0* 27711.0*   CBG:  Recent Labs Lab 02/12/13 1716 02/12/13 1807 02/12/13 1810 02/12/13 2148 02/13/13 0753  GLUCAP 52* 33* 38* 104* 89    Recent Results (from the past 240 hour(s))  CULTURE, BLOOD (SINGLE)     Status: None   Collection Time    02/08/13  7:02 PM      Result Value Range Status   Specimen Description BLOOD HAND LEFT   Final   Special Requests BOTTLES DRAWN AEROBIC ONLY 1CC   Final   Culture  Setup Time     Final   Value: 02/09/2013 01:45     Performed at [facility]   Culture     Final   Value:        BLOOD CULTURE RECEIVED NO GROWTH TO DATE CULTURE WILL BE HELD FOR 5 DAYS BEFORE ISSUING A FINAL NEGATIVE REPORT     Performed at [facility]   Report Status PENDING   Incomplete  MRSA PCR SCREENING     Status: None   Collection Time    02/08/13  8:22 PM      Result Value Range Status   MRSA by PCR NEGATIVE  NEGATIVE Final   Comment:            The GeneXpert MRSA Assay (FDA     approved for NASAL  specimens     only), is one component of a     comprehensive MRSA colonization     surveillance program. It is not     intended to diagnose MRSA     infection nor to guide or     monitor treatment for     MRSA infections.     Studies: Dg Chest 2 View  02/11/2013   CLINICAL DATA:  CHF and pneumonia  EXAM: CHEST  2 VIEW  COMPARISON:  02/10/2013  FINDINGS: There has been interval removal of the endotracheal tube and nasogastric tube. There are moderate to large bilateral pleural effusions present left-greater-than-right. These appear increased in volume from previous exam with associated decreased aeration to the lungs. Mild edema pattern is unchanged from previous exam.  IMPRESSION: 1. Persistent moderate bilateral pleural effusions and interstitial edema. When compared with the previous exam the pleural effusions are slightly increased in the interval.   Electronically Signed   By: Signa Kell M.D.   On: 02/11/2013 16:27    Scheduled Meds: . antiseptic oral rinse  15 mL Mouth Rinse QID  . darbepoetin (ARANESP) injection - DIALYSIS  150 mcg Intravenous Q Thu-HD  . ferric gluconate (FERRLECIT/NULECIT) IV  125 mg Intravenous Q T,Th,Sa-HD  . gabapentin  300 mg Per Tube Q8H  . [START ON 02/14/2013] levofloxacin  500 mg Oral Q48H  . pantoprazole  40 mg Oral Q breakfast   Continuous Infusions:   Active Problems:   CKD (chronic kidney disease) stage V requiring chronic dialysis   Atrial fibrillation   Acute respiratory failure with hypoxia   ESRD on hemodialysis    Time spent:    [facility]  [facility] Pager [phone] If 7PM-7AM, please contact night-coverage at [email or web address], password [facility] 02/13/2013, 10:01 AM  LOS: 5 days

## 2013-02-13 NOTE — Progress Notes (Signed)
Subjective: Interval History: has complaints wants to get home.  Objective: Vital signs in last 24 hours: Temp:  [97.3 F (36.3 C)-97.7 F (36.5 C)] 97.5 F (36.4 C) (11/23 0735) Pulse Rate:  [76-88] 80 (11/23 0735) Resp:  [17-18] 18 (11/23 0735) BP: (100-165)/(27-70) 103/70 mmHg (11/23 0735) SpO2:  [95 %-98 %] 95 % (11/23 0735) Weight:  [57.5 kg (126 lb 12.2 oz)-61.2 kg (134 lb 14.7 oz)] 61.2 kg (134 lb 14.7 oz) (11/22 2022) Weight change: -0.5 kg (-1 lb 1.6 oz)  Intake/Output from previous day: 11/22 0701 - 11/23 0700 In: 600 [P.O.:600] Out: 0  Intake/Output this shift: Total I/O In: 240 [P.O.:240] Out: 0   General appearance: alert, cooperative and slowed mentation Resp: diminished breath sounds bibasilar, rales bibasilar and rhonchi bibasilar Cardio: S1, S2 normal and systolic murmur: systolic ejection 2/6, decrescendo at 2nd left intercostal space GI: pos bs, liver down 6cm, firm Extremities: avg with no B&T, firm  Lab Results:  Recent Labs  02/12/13 0359 02/13/13 0601  WBC 5.0 6.1  HGB 11.2* 10.8*  HCT 37.5* 35.1*  PLT 124* 141*   BMET:  Recent Labs  02/12/13 0359 02/12/13 1945 02/13/13 0601  NA 140  --  138  K 4.4  --  3.6  CL 99  --  97  CO2 23  --  26  GLUCOSE 110* 105* 82  BUN 22  --  39*  CREATININE 3.75*  --  5.46*  CALCIUM 9.7  --  9.5   No results found for this basename: PTH,  in the last 72 hours Iron Studies: No results found for this basename: IRON, TIBC, TRANSFERRIN, FERRITIN,  in the last 72 hours  Studies/Results: Dg Chest 2 View  02/11/2013   CLINICAL DATA:  CHF and pneumonia  EXAM: CHEST  2 VIEW  COMPARISON:  02/10/2013  FINDINGS: There has been interval removal of the endotracheal tube and nasogastric tube. There are moderate to large bilateral pleural effusions present left-greater-than-right. These appear increased in volume from previous exam with associated decreased aeration to the lungs. Mild edema pattern is unchanged from  previous exam.  IMPRESSION: 1. Persistent moderate bilateral pleural effusions and interstitial edema. When compared with the previous exam the pleural effusions are slightly increased in the interval.   Electronically Signed   By: Signa Kell M.D.   On: 02/11/2013 16:27    I have reviewed the patient's current medications.  Assessment/Plan: 1 CRf clotted avg , get declot, and do HD 2 Anemia epo/fe 3 HTPH vit D 4 CM 5 ? Pneu 6 Volatile bp P declot, hd. Epo, fe    LOS: 5 days   [name],Imre L 02/13/2013,10:58 AM

## 2013-02-14 ENCOUNTER — Encounter (HOSPITAL_COMMUNITY): Payer: Self-pay | Admitting: Radiology

## 2013-02-14 ENCOUNTER — Inpatient Hospital Stay (HOSPITAL_COMMUNITY): Payer: Medicare Other

## 2013-02-14 DIAGNOSIS — I4891 Unspecified atrial fibrillation: Secondary | ICD-10-CM

## 2013-02-14 LAB — GLUCOSE, CAPILLARY
Glucose-Capillary: 33 mg/dL — CL (ref 70–99)
Glucose-Capillary: 78 mg/dL (ref 70–99)
Glucose-Capillary: 94 mg/dL (ref 70–99)
Glucose-Capillary: <10 mg/dL — CL (ref 70–99)

## 2013-02-14 LAB — GLUCOSE, RANDOM: Glucose, Bld: 136 mg/dL — ABNORMAL HIGH (ref 70–99)

## 2013-02-14 MED ORDER — DEXTROSE 50 % IV SOLN: 50.0000 mL | Freq: Once | INTRAVENOUS | Status: DC | PRN

## 2013-02-14 MED ORDER — HYDROCODONE-ACETAMINOPHEN 5-325 MG PO TABS
1.0000 | ORAL_TABLET | Freq: Four times a day (QID) | ORAL | Status: DC | PRN
  Administered 2013-02-14: 1 via ORAL
  Filled 2013-02-14: qty 1

## 2013-02-14 MED ORDER — SODIUM CHLORIDE 0.9 % IV SOLN
INTRAVENOUS | Status: AC | PRN
  Administered 2013-02-14: 50 mL/h via INTRAVENOUS

## 2013-02-14 MED ORDER — ALTEPLASE 2 MG IJ SOLR
2.0000 mg | Freq: Once | INTRAMUSCULAR | Status: AC
  Administered 2013-02-14: 2 mg
  Filled 2013-02-14: qty 2

## 2013-02-14 MED ORDER — DEXTROSE 50 % IV SOLN
INTRAVENOUS | Status: AC
  Administered 2013-02-14: 25 mL
  Filled 2013-02-14: qty 50

## 2013-02-14 MED ORDER — DEXTROSE 50 % IV SOLN
25.0000 mL | Freq: Once | INTRAVENOUS | Status: AC | PRN
  Administered 2013-02-14: 25 mL via INTRAVENOUS

## 2013-02-14 MED ORDER — FENTANYL CITRATE 0.05 MG/ML IJ SOLN
INTRAMUSCULAR | Status: AC
  Filled 2013-02-14: qty 4

## 2013-02-14 MED ORDER — MIDAZOLAM HCL 2 MG/2ML IJ SOLN
INTRAMUSCULAR | Status: AC
  Filled 2013-02-14: qty 6

## 2013-02-14 MED ORDER — HEPARIN SODIUM (PORCINE) 1000 UNIT/ML IJ SOLN
INTRAMUSCULAR | Status: AC
  Administered 2013-02-14: 3000 [IU]
  Filled 2013-02-14: qty 1

## 2013-02-14 MED ORDER — LEVOFLOXACIN 500 MG PO TABS: 500.0000 mg | ORAL_TABLET | ORAL | Status: DC

## 2013-02-14 MED ORDER — IOHEXOL 300 MG/ML  SOLN
100.0000 mL | Freq: Once | INTRAMUSCULAR | Status: AC | PRN
  Administered 2013-02-14: 50 mL via INTRAVENOUS

## 2013-02-14 MED ORDER — ALTEPLASE 100 MG IV SOLR
2.0000 mg | Freq: Once | INTRAVENOUS | Status: DC
  Filled 2013-02-14: qty 2

## 2013-02-14 MED ORDER — MIDAZOLAM HCL 2 MG/2ML IJ SOLN
INTRAMUSCULAR | Status: AC | PRN
  Administered 2013-02-14: 2 mg via INTRAVENOUS

## 2013-02-14 MED ORDER — FENTANYL CITRATE 0.05 MG/ML IJ SOLN
INTRAMUSCULAR | Status: AC | PRN
  Administered 2013-02-14: 50 ug via INTRAVENOUS

## 2013-02-14 NOTE — Progress Notes (Signed)
Patient ID: [name], male   DOB: 1951-03-24, 62 y.o.   MRN: [id number]  [facility] Progress Note   Assessment/ Plan:   1. HCAP/Volume overload- improved with BS-ABx and frequent dialysis. Plans for DC home today. 2.ESRD: on HD today, s/p declot of his AVG today. 3. 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 Nutrition:continue ONS 6. Hypertension:BP well controlled  Subjective:   Reports to be feeling well and wants to go home   Objective:   BP 103/38  Pulse 67  Temp(Src) 98 F (36.7 C) (Oral)  Resp 18  Ht 5\' 8"  (1.727 m)  Wt 61.735 kg (136 lb 1.6 oz)  BMI 20.70 kg/m2  SpO2 97%  Physical Exam: AOZ:HYQMVHQIONG on HD EXB:MWUXL RRR, normal S1 and S2  Resp:CTA bilaterally, no rales/rhonchi KGM:WNUU, flat, NT, BS normal Ext: No LE edema  Labs: BMET  Recent Labs Lab 02/08/13 1437 02/08/13 1456 02/08/13 1903 02/09/13 0402 02/10/13 0405 02/11/13 0558 02/12/13 0359 02/12/13 1945 02/13/13 0601  NA 139 141  --  139 138 140 140  --  138  K 3.4* 3.0*  --  4.3 4.5 3.8 4.4  --  3.6  CL 98 101  --  98 99 98 99  --  97  CO2 29  --   --  25 21 27 23   --  26  GLUCOSE 94 94  --  130* 104* 120* 110* 105* 82  BUN 32* 31*  --  21 42* 28* 22  --  39*  CREATININE 7.34* 7.40* 6.99* 4.94* 6.54* 4.50* 3.75*  --  5.46*  CALCIUM 9.6  --   --  9.4 9.3 9.4 9.7  --  9.5  PHOS  --   --   --  4.2 6.2* 5.3*  --   --   --    CBC  Recent Labs Lab 02/08/13 1437  02/09/13 0402 02/10/13 0405 02/11/13 0558 02/12/13 0359 02/13/13 0601  WBC 5.8  < > 10.2 5.6 6.1 5.0 6.1  NEUTROABS 4.0  --  9.6*  --   --   --   --   HGB 10.2*  < > 10.6* 9.2* 10.6* 11.2* 10.8*  HCT 33.7*  < > 34.0* 28.1* 34.9* 37.5* 35.1*  MCV 88.7  < > 87.9 84.1 88.8 90.4 88.6  PLT 194  < > 209 139* 140* 124* 141*  < > = values in this interval not displayed.  Medications:    . antiseptic oral rinse  15 mL Mouth Rinse QID  .  darbepoetin (ARANESP) injection - DIALYSIS  150 mcg Intravenous Q Thu-HD  . fentaNYL      . ferric gluconate (FERRLECIT/NULECIT) IV  125 mg Intravenous Q T,Th,Sa-HD  . gabapentin  300 mg Per Tube Q8H  . levofloxacin  500 mg Oral Q48H  . midazolam      . multivitamin  1 tablet Oral QHS  . pantoprazole  40 mg Oral Q breakfast   Zetta Bills, MD 02/14/2013, 1:52 PM

## 2013-02-14 NOTE — Procedures (Signed)
Declot L arm HD graft, venous and art PTA No complication No blood loss. See complete dictation in [facility].

## 2013-02-14 NOTE — Discharge Summary (Addendum)
Physician Discharge Summary  [name] WUJ:[id number] DOB: January 29, 1951 DOA: 02/08/2013  PCP: Rudi Heap, MD  Admit date: 02/08/2013 Discharge date: 02/14/2013  Time spent: 45 minutes  Recommendations for Outpatient Follow-up:  1. PCP in 1 week 2. Needs Outpatient HD 4 times a week if possible  Discharge Diagnoses:    Acute resp failure   Volume overload   S/p VDRF   Altered mental status   CKD (chronic kidney disease) stage V requiring chronic dialysis   Atrial fibrillation   ESRD on hemodialysis   Chronic pleural effusions   Discharge Condition: stable  Diet recommendation: Renal  Filed Weights   02/12/13 2022 02/13/13 2134 02/14/13 1330  Weight: 61.2 kg (134 lb 14.7 oz) 61.735 kg (136 lb 1.6 oz) 60.8 kg (134 lb 0.6 oz)    History of present illness:  CHIEF COMPLAINT: Altered mental status  BRIEF PATIENT DESCRIPTION: 62 year old AAM with ESRD on HD, HLD, Arthritis, Claudication, Chronic Anemia, CHF, PVD, HTN, Dyspnea, Cataract, PUD, Stroke, GERD, and bilateral BKA evaluated in the ED for AMS and intubated with lack of airway protection. Recently d/c'ed from [facility] Currently hypotensive in HD. Also question HCAP vs CHF.   Hospital Course:  1. Acute resp failure - pleural effusions/volume overload and ?HCAP  - s/p VDRF  - improved with HD and abx   2. ESRD on HD/volume overload  -was being dialyzed daily per renal,  -11/22 with clotted AVG-per Renal  -declotted per IR today 11/24 and completed HD -chronic effusions  -CXR 11/21 without significant change  -mgt per Renal  -clinically improved, weaned off O2  3. HCAP  -was on Vanc/Zosyn  -transitioned from Zosyn to PO levaquin 11/22, continue for 2 more days   4. Anemia of chronic disease  - stable   5. DM  -stable, normal CBGs without any Insulin -today CBGs this am were very low, but patient was completely asymptomatic and ambulating in halls, hence we checked stat Blood glucose, which was  136,  -noted multiple times that his CBgs and Blood glucose was not correlating   Discharge Exam: Filed Vitals:   02/14/13 1400  BP: 87/31  Pulse: 69  Temp:   Resp:     General: AAOx3, no distress Cardiovascular:S1S2/RRR Respiratory: Mildly diminished at bases, rest clear  Discharge Instructions  Discharge Orders   Future Appointments Provider Department Dept Phone   05/09/2013 1:00 PM [facility] [place] [facility] [address] [phone]   05/09/2013 1:30 PM [facility] [place] [facility] [address] [phone]   05/09/2013 2:40 PM Carma Lair Nickel, NP [facility] [phone]   Future Orders Complete By Expires   Discharge instructions  As directed    Comments:     Renal Diet   Increase activity slowly  As directed        Medication List    STOP taking these medications       clarithromycin 250 MG tablet  Commonly known as:  BIAXIN      TAKE these medications       ALPRAZolam 1 MG tablet  Commonly known as:  XANAX  Take 1 tablet (1 mg total) by mouth 2 (two) times daily as needed for anxiety or sleep.     cinacalcet 60 MG tablet  Commonly known as:  SENSIPAR  Take 60 mg by mouth daily.     diclofenac 50 MG EC tablet  Commonly known as:  VOLTAREN  Take 50 mg by  mouth 2 (two) times daily.     DULoxetine 60 MG capsule  Commonly known as:  CYMBALTA  Take 60 mg by mouth daily.     folic acid 1 MG tablet  Commonly known as:  FOLVITE  Take 1 mg by mouth daily.     gabapentin 300 MG capsule  Commonly known as:  NEURONTIN  Take 1 capsule (300 mg total) by mouth 3 (three) times daily.     hydrALAZINE 25 MG tablet  Commonly known as:  APRESOLINE  Take 25 mg by mouth 3 (three) times daily.     isosorbide mononitrate 30 MG 24 hr tablet  Commonly known as:  IMDUR  Take 30 mg by mouth daily.     levofloxacin 500 MG tablet  Commonly known as:  LEVAQUIN  Take 1 tablet (500 mg total) by mouth every other  day. For 4 days     multivitamin Tabs tablet  Take 1 tablet by mouth daily.     omeprazole 20 MG capsule  Commonly known as:  PRILOSEC  Take 20 mg by mouth daily.     oxyCODONE-acetaminophen 7.5-325 MG per tablet  Commonly known as:  PERCOCET  Take 1 tablet by mouth every 8 (eight) hours as needed for pain.     PHOSLO 667 MG capsule  Generic drug:  calcium acetate  Take 1,334 mg by mouth 2 (two) times daily.     PROAIR HFA 108 (90 BASE) MCG/ACT inhaler  Generic drug:  albuterol  Inhale 1 puff into the lungs every 4 (four) hours as needed for wheezing.     tiZANidine 4 MG tablet  Commonly known as:  ZANAFLEX  Take 4 mg by mouth every 6 (six) hours as needed for muscle spasms.       Allergies  Allergen Reactions  . Ambien [Zolpidem Tartrate] Other (See Comments)    Hallucinations       Follow-up Information   Follow up with Rudi Heap, MD. Schedule an appointment as soon as possible for a visit in 1 week.   Specialty:  Family Medicine   Contact information:   [address] [place] [address] [phone]        The results of significant diagnostics from this hospitalization (including imaging, microbiology, ancillary and laboratory) are listed below for reference.    Significant Diagnostic Studies: Dg Chest 2 View  02/11/2013   CLINICAL DATA:  CHF and pneumonia  EXAM: CHEST  2 VIEW  COMPARISON:  02/10/2013  FINDINGS: There has been interval removal of the endotracheal tube and nasogastric tube. There are moderate to large bilateral pleural effusions present left-greater-than-right. These appear increased in volume from previous exam with associated decreased aeration to the lungs. Mild edema pattern is unchanged from previous exam.  IMPRESSION: 1. Persistent moderate bilateral pleural effusions and interstitial edema. When compared with the previous exam the pleural effusions are slightly increased in the interval.   Electronically Signed   By: Signa Kell  M.D.   On: 02/11/2013 16:27   Dg Chest 2 View  02/01/2013   CLINICAL DATA:  COPD exacerbation. History of pleural effusion.  EXAM: CHEST  2 VIEW  COMPARISON:  Plain films of the chest 01/20/2013 and CT chest 12/27/2012. CT abdomen and pelvis 10/31/2011.  FINDINGS: Left worse than right pleural effusions persist without interval change. Associated atelectasis is noted. Heart size is upper normal. No pneumothorax is identified. Visualized upper abdomen demonstrates multiple calcifications consistent with calcified renal cysts.  IMPRESSION: No  change in left worse than right pleural effusions. No new abnormality.   Electronically Signed   By: Drusilla Kanner M.D.   On: 02/01/2013 09:47   Dg Chest 2 View  01/20/2013   CLINICAL DATA:  COPD exacerbation.  EXAM: CHEST  2 VIEW  COMPARISON:  Chest radiograph of December 26, 2012 ; CT scan of December 27, 2012.  FINDINGS: Stable cardiomegaly. Bilateral pleural effusions are again noted and unchanged compared to prior exam ; left is greater in right and most likely both are loculated. No pneumothorax is noted.  IMPRESSION: Stable bilateral loculated pleural effusions with left greater than right.   Electronically Signed   By: Roque Lias M.D.   On: 01/20/2013 15:04   Ct Head Wo Contrast  02/08/2013   CLINICAL DATA:  Encephalopathy. Shortness of breath. Altered mental status.  EXAM: CT HEAD WITHOUT CONTRAST  TECHNIQUE: Contiguous axial images were obtained from the base of the skull through the vertex without intravenous contrast.  COMPARISON:  09/08/2012  FINDINGS: 1.8 x 0.9 cm hypodensity in the left inferior cerebellum compatible with remote stroke, stable.  Brainstem, thalami, and basal ganglia unremarkable. Ventricular system and basilar cisterns normal.  Confluent hypodensity in the right frontal lobe posteriorly along the sylvian fissure similar to prior exam and likely representing a combination of remote CVA and chronic ischemic microvascular white matter  disease. Suspected remote small stroke in the left frontal lobe, stable, with small punctate dystrophic calcification.  Fluid density in the posterior nasopharynx noted. No intracranial hemorrhage, mass lesion, or acute CVA. Atherosclerotic calcification of the carotid siphons noted.  IMPRESSION: 1. Remote strokes in the frontal lobes and left cerebellum. 2. Periventricular white matter and corona radiata hypodensities favor chronic ischemic microvascular white matter disease. 3. No acute intracranial findings.   Electronically Signed   By: Herbie Baltimore M.D.   On: 02/08/2013 19:57   Ir Pta Venous Left  02/14/2013   CLINICAL DATA:  Occluded dialysis graft  TECHNIQUE: DIALYSIS GRAFT DECLOT  VENOUS ANGIOPLASTY  ARTERIAL ANASTOMOSIS ANGIOPLASTY  ULTRASOUND GUIDANCE FOR VASCULAR ACCESS X2  The procedure, risks (including but not limited to bleeding, infection, organ damage ), benefits, and alternatives were explained to the patient. Questions regarding the procedure were encouraged and answered. The patient understands and consents to the procedure.  Intravenous Fentanyl and Versed were administered as conscious sedation during continuous cardiorespiratory monitoring by the radiology RN, with a total moderate sedation time of 44 minutes.  The graft just central to the arterial anastomosis was accessed antegrade with a 21-gauge micropuncture needle under real-time ultrasonic guidance after the overlying skin prepped with Betadine, draped in usual sterile fashion, infiltrated locally with 1% lidocaine. Needle exchanged over 018 guidewire for transitional dilator through which 2 mg t-PA was administered. Ultrasound imaging documentation was saved. Through the dilator, a Bentson wire was advanced to the venous anastomosis. Over this a 36F sheath was placed, through which a 5 [place] Kumpe catheter was advanced for outflow venography. This showed patency of the outflow venous system through the SVC. 3000 units heparin  were administered. The Arrow PTD device was used to macerate thrombus in the graft.  In similar fashion, the more central aspect of the graft was accessed retrograde under ultrasound with a micropuncture needle, exchanged for a transitional dilator. The venous limb dilator was exchanged in similar fashion for a 6 French vascular sheath. A Kumpe the catheter was negotiated across the arterial anastomosis and into the proximal brachial artery. Kumpe exchanged over a  wire for the Fogarty device, used to dislodge the platelet plug into the arterial limb of the graft, where it was macerated. The PTD device was used again to remove any residual platelet plug from the graft. Injection showed clearance of thrombus from the graft. Balloon angioplasty of a long segment central graft and a venous anastomotic Mild stenosis was performed using a 7 mm x 4 cm Conquest angioplasty balloon with good response. Balloon was removed and injection showed patency of the anastomosis, without extravasation or other apparent complication.  Residual thrombus/stenosis in the graft just central to the arterial anastomosis was initially treated with a 5 mm most IM balloon, with still some residual partial occlusion of flow at this level. The graft was then further dilated at this point with the 7 mm Conquest balloon after removal of the antegrade sheath.  A follow shuntogram was performed, demonstrating good flow through the outflow vein, no extravasation. Injection across the arterial anastomosis demonstrate this to be widely patent, with unremarkable appearance of the visualized native arterial circulation. The catheter and sheaths were then removed and hemostasis achieved with 2-0 Ethilon sutures. Patient tolerated procedure well.  COMPARISON:  None  FLUOROSCOPY TIME:  3 min 24 seconds  IMPRESSION: 1. Technically successful declot of left upper arm straight synthetic hemodialysis graft. 2. Technically successful balloon angioplasty of venous  anastomotic stenosis. 3. Technically successful 5 and 7 mm balloon angioplasty at the arterial anastomosis.   Electronically Signed   By: Oley Balm M.D.   On: 02/14/2013 11:47   Ir Pta Venous Left  02/14/2013   CLINICAL DATA:  Occluded dialysis graft  TECHNIQUE: DIALYSIS GRAFT DECLOT  VENOUS ANGIOPLASTY  ARTERIAL ANASTOMOSIS ANGIOPLASTY  ULTRASOUND GUIDANCE FOR VASCULAR ACCESS X2  The procedure, risks (including but not limited to bleeding, infection, organ damage ), benefits, and alternatives were explained to the patient. Questions regarding the procedure were encouraged and answered. The patient understands and consents to the procedure.  Intravenous Fentanyl and Versed were administered as conscious sedation during continuous cardiorespiratory monitoring by the radiology RN, with a total moderate sedation time of 44 minutes.  The graft just central to the arterial anastomosis was accessed antegrade with a 21-gauge micropuncture needle under real-time ultrasonic guidance after the overlying skin prepped with Betadine, draped in usual sterile fashion, infiltrated locally with 1% lidocaine. Needle exchanged over 018 guidewire for transitional dilator through which 2 mg t-PA was administered. Ultrasound imaging documentation was saved. Through the dilator, a Bentson wire was advanced to the venous anastomosis. Over this a 50F sheath was placed, through which a 5 [place] Kumpe catheter was advanced for outflow venography. This showed patency of the outflow venous system through the SVC. 3000 units heparin were administered. The Arrow PTD device was used to macerate thrombus in the graft.  In similar fashion, the more central aspect of the graft was accessed retrograde under ultrasound with a micropuncture needle, exchanged for a transitional dilator. The venous limb dilator was exchanged in similar fashion for a 6 French vascular sheath. A Kumpe the catheter was negotiated across the arterial anastomosis and  into the proximal brachial artery. Kumpe exchanged over a wire for the Fogarty device, used to dislodge the platelet plug into the arterial limb of the graft, where it was macerated. The PTD device was used again to remove any residual platelet plug from the graft. Injection showed clearance of thrombus from the graft. Balloon angioplasty of a long segment central graft and a venous anastomotic Mild stenosis was  performed using a 7 mm x 4 cm Conquest angioplasty balloon with good response. Balloon was removed and injection showed patency of the anastomosis, without extravasation or other apparent complication.  Residual thrombus/stenosis in the graft just central to the arterial anastomosis was initially treated with a 5 mm most IM balloon, with still some residual partial occlusion of flow at this level. The graft was then further dilated at this point with the 7 mm Conquest balloon after removal of the antegrade sheath.  A follow shuntogram was performed, demonstrating good flow through the outflow vein, no extravasation. Injection across the arterial anastomosis demonstrate this to be widely patent, with unremarkable appearance of the visualized native arterial circulation. The catheter and sheaths were then removed and hemostasis achieved with 2-0 Ethilon sutures. Patient tolerated procedure well.  COMPARISON:  None  FLUOROSCOPY TIME:  3 min 24 seconds  IMPRESSION: 1. Technically successful declot of left upper arm straight synthetic hemodialysis graft. 2. Technically successful balloon angioplasty of venous anastomotic stenosis. 3. Technically successful 5 and 7 mm balloon angioplasty at the arterial anastomosis.   Electronically Signed   By: Oley Balm M.D.   On: 02/14/2013 11:47   Ir Angio Av Shunt Addl Access  02/14/2013   CLINICAL DATA:  Occluded dialysis graft  TECHNIQUE: DIALYSIS GRAFT DECLOT  VENOUS ANGIOPLASTY  ARTERIAL ANASTOMOSIS ANGIOPLASTY  ULTRASOUND GUIDANCE FOR VASCULAR ACCESS X2  The  procedure, risks (including but not limited to bleeding, infection, organ damage ), benefits, and alternatives were explained to the patient. Questions regarding the procedure were encouraged and answered. The patient understands and consents to the procedure.  Intravenous Fentanyl and Versed were administered as conscious sedation during continuous cardiorespiratory monitoring by the radiology RN, with a total moderate sedation time of 44 minutes.  The graft just central to the arterial anastomosis was accessed antegrade with a 21-gauge micropuncture needle under real-time ultrasonic guidance after the overlying skin prepped with Betadine, draped in usual sterile fashion, infiltrated locally with 1% lidocaine. Needle exchanged over 018 guidewire for transitional dilator through which 2 mg t-PA was administered. Ultrasound imaging documentation was saved. Through the dilator, a Bentson wire was advanced to the venous anastomosis. Over this a 56F sheath was placed, through which a 5 [place] Kumpe catheter was advanced for outflow venography. This showed patency of the outflow venous system through the SVC. 3000 units heparin were administered. The Arrow PTD device was used to macerate thrombus in the graft.  In similar fashion, the more central aspect of the graft was accessed retrograde under ultrasound with a micropuncture needle, exchanged for a transitional dilator. The venous limb dilator was exchanged in similar fashion for a 6 French vascular sheath. A Kumpe the catheter was negotiated across the arterial anastomosis and into the proximal brachial artery. Kumpe exchanged over a wire for the Fogarty device, used to dislodge the platelet plug into the arterial limb of the graft, where it was macerated. The PTD device was used again to remove any residual platelet plug from the graft. Injection showed clearance of thrombus from the graft. Balloon angioplasty of a long segment central graft and a venous anastomotic  Mild stenosis was performed using a 7 mm x 4 cm Conquest angioplasty balloon with good response. Balloon was removed and injection showed patency of the anastomosis, without extravasation or other apparent complication.  Residual thrombus/stenosis in the graft just central to the arterial anastomosis was initially treated with a 5 mm most IM balloon, with still some residual partial occlusion  of flow at this level. The graft was then further dilated at this point with the 7 mm Conquest balloon after removal of the antegrade sheath.  A follow shuntogram was performed, demonstrating good flow through the outflow vein, no extravasation. Injection across the arterial anastomosis demonstrate this to be widely patent, with unremarkable appearance of the visualized native arterial circulation. The catheter and sheaths were then removed and hemostasis achieved with 2-0 Ethilon sutures. Patient tolerated procedure well.  COMPARISON:  None  FLUOROSCOPY TIME:  3 min 24 seconds  IMPRESSION: 1. Technically successful declot of left upper arm straight synthetic hemodialysis graft. 2. Technically successful balloon angioplasty of venous anastomotic stenosis. 3. Technically successful 5 and 7 mm balloon angioplasty at the arterial anastomosis.   Electronically Signed   By: Oley Balm M.D.   On: 02/14/2013 11:47   Ir US Guide Vasc Access Left  02/14/2013   CLINICAL DATA:  Occluded dialysis graft  TECHNIQUE: DIALYSIS GRAFT DECLOT  VENOUS ANGIOPLASTY  ARTERIAL ANASTOMOSIS ANGIOPLASTY  ULTRASOUND GUIDANCE FOR VASCULAR ACCESS X2  The procedure, risks (including but not limited to bleeding, infection, organ damage ), benefits, and alternatives were explained to the patient. Questions regarding the procedure were encouraged and answered. The patient understands and consents to the procedure.  Intravenous Fentanyl and Versed were administered as conscious sedation during continuous cardiorespiratory monitoring by the radiology RN,  with a total moderate sedation time of 44 minutes.  The graft just central to the arterial anastomosis was accessed antegrade with a 21-gauge micropuncture needle under real-time ultrasonic guidance after the overlying skin prepped with Betadine, draped in usual sterile fashion, infiltrated locally with 1% lidocaine. Needle exchanged over 018 guidewire for transitional dilator through which 2 mg t-PA was administered. Ultrasound imaging documentation was saved. Through the dilator, a Bentson wire was advanced to the venous anastomosis. Over this a 1F sheath was placed, through which a 5 [place] Kumpe catheter was advanced for outflow venography. This showed patency of the outflow venous system through the SVC. 3000 units heparin were administered. The Arrow PTD device was used to macerate thrombus in the graft.  In similar fashion, the more central aspect of the graft was accessed retrograde under ultrasound with a micropuncture needle, exchanged for a transitional dilator. The venous limb dilator was exchanged in similar fashion for a 6 French vascular sheath. A Kumpe the catheter was negotiated across the arterial anastomosis and into the proximal brachial artery. Kumpe exchanged over a wire for the Fogarty device, used to dislodge the platelet plug into the arterial limb of the graft, where it was macerated. The PTD device was used again to remove any residual platelet plug from the graft. Injection showed clearance of thrombus from the graft. Balloon angioplasty of a long segment central graft and a venous anastomotic Mild stenosis was performed using a 7 mm x 4 cm Conquest angioplasty balloon with good response. Balloon was removed and injection showed patency of the anastomosis, without extravasation or other apparent complication.  Residual thrombus/stenosis in the graft just central to the arterial anastomosis was initially treated with a 5 mm most IM balloon, with still some residual partial occlusion of  flow at this level. The graft was then further dilated at this point with the 7 mm Conquest balloon after removal of the antegrade sheath.  A follow shuntogram was performed, demonstrating good flow through the outflow vein, no extravasation. Injection across the arterial anastomosis demonstrate this to be widely patent, with unremarkable appearance of the visualized  native arterial circulation. The catheter and sheaths were then removed and hemostasis achieved with 2-0 Ethilon sutures. Patient tolerated procedure well.  COMPARISON:  None  FLUOROSCOPY TIME:  3 min 24 seconds  IMPRESSION: 1. Technically successful declot of left upper arm straight synthetic hemodialysis graft. 2. Technically successful balloon angioplasty of venous anastomotic stenosis. 3. Technically successful 5 and 7 mm balloon angioplasty at the arterial anastomosis.   Electronically Signed   By: Oley Balm M.D.   On: 02/14/2013 11:47   Dg Chest Port 1 View  02/10/2013   CLINICAL DATA:  Endotracheal tube position  EXAM: PORTABLE CHEST - 1 VIEW  COMPARISON:  02/09/2013  FINDINGS: Endotracheal tube in good position.  NG tube enters the stomach.  Moderately large bilateral pleural effusions and bibasilar consolidation unchanged.  Mild vascular congestion. Mild progression of bilateral airspace disease, possible edema.  IMPRESSION: Endotracheal tube in good position.  Mild progression of bilateral airspace disease suggesting pulmonary edema. Moderate bilateral effusions are unchanged.   Electronically Signed   By: Marlan Palau M.D.   On: 02/10/2013 07:35   Dg Chest Port 1 View  02/09/2013   CLINICAL DATA:  Respiratory failure.  EXAM: PORTABLE CHEST - 1 VIEW  COMPARISON:  February 08, 2013.  FINDINGS: Endotracheal tube is in grossly good position with distal tip 5 cm above the carina. Bilateral pleural effusions are stable with left larger than right. No pneumothorax is noted. Degenerative joint disease of both glenohumeral joints is  noted.  IMPRESSION: Stable bilateral pleural effusions with left greater than right. Endotracheal tube in grossly good position.   Electronically Signed   By: Roque Lias M.D.   On: 02/09/2013 12:14   Dg Chest Port 1 View  02/08/2013   CLINICAL DATA:  Endotracheal tube placement  EXAM: PORTABLE CHEST - 1 VIEW  COMPARISON:  Radiograph 02/08/2013  FINDINGS: Endotracheal tube is 4.3 cm from carinal. Stable enlarged cardiac silhouette. There is bilateral perihilar airspace disease. There are bilateral pleural effusions.  IMPRESSION: Endotracheal tube in good position.  Bilateral airspace disease and pleural fluid suggests pulmonary edema / congestive heart failure.   Electronically Signed   By: Genevive Bi M.D.   On: 02/08/2013 19:02   Dg Chest Port 1 View  02/08/2013   CLINICAL DATA:  Shortness of breath.  Unresponsive.  EXAM: PORTABLE CHEST - 1 VIEW  COMPARISON:  02/07/2013.  FINDINGS: Persistent prominent bilateral pleural effusions present. Interstitial prominence noted on today's exam suggesting possible component of congestive heart failure. Cardiomegaly. Stable vascular calcification noted. No pneumothorax.  IMPRESSION: 1. Persistent prominent bilateral pleural effusions. 2. New onset of pulmonary interstitial edema bilaterally suggesting a component of congestive heart failure.   Electronically Signed   By: Maisie Fus  Register   On: 02/08/2013 14:43   Ir Declot Left Mod Sed  02/14/2013   CLINICAL DATA:  Occluded dialysis graft  TECHNIQUE: DIALYSIS GRAFT DECLOT  VENOUS ANGIOPLASTY  ARTERIAL ANASTOMOSIS ANGIOPLASTY  ULTRASOUND GUIDANCE FOR VASCULAR ACCESS X2  The procedure, risks (including but not limited to bleeding, infection, organ damage ), benefits, and alternatives were explained to the patient. Questions regarding the procedure were encouraged and answered. The patient understands and consents to the procedure.  Intravenous Fentanyl and Versed were administered as conscious sedation during  continuous cardiorespiratory monitoring by the radiology RN, with a total moderate sedation time of 44 minutes.  The graft just central to the arterial anastomosis was accessed antegrade with a 21-gauge micropuncture needle under real-time ultrasonic guidance after the  overlying skin prepped with Betadine, draped in usual sterile fashion, infiltrated locally with 1% lidocaine. Needle exchanged over 018 guidewire for transitional dilator through which 2 mg t-PA was administered. Ultrasound imaging documentation was saved. Through the dilator, a Bentson wire was advanced to the venous anastomosis. Over this a 25F sheath was placed, through which a 5 [place] Kumpe catheter was advanced for outflow venography. This showed patency of the outflow venous system through the SVC. 3000 units heparin were administered. The Arrow PTD device was used to macerate thrombus in the graft.  In similar fashion, the more central aspect of the graft was accessed retrograde under ultrasound with a micropuncture needle, exchanged for a transitional dilator. The venous limb dilator was exchanged in similar fashion for a 6 French vascular sheath. A Kumpe the catheter was negotiated across the arterial anastomosis and into the proximal brachial artery. Kumpe exchanged over a wire for the Fogarty device, used to dislodge the platelet plug into the arterial limb of the graft, where it was macerated. The PTD device was used again to remove any residual platelet plug from the graft. Injection showed clearance of thrombus from the graft. Balloon angioplasty of a long segment central graft and a venous anastomotic Mild stenosis was performed using a 7 mm x 4 cm Conquest angioplasty balloon with good response. Balloon was removed and injection showed patency of the anastomosis, without extravasation or other apparent complication.  Residual thrombus/stenosis in the graft just central to the arterial anastomosis was initially treated with a 5 mm most  IM balloon, with still some residual partial occlusion of flow at this level. The graft was then further dilated at this point with the 7 mm Conquest balloon after removal of the antegrade sheath.  A follow shuntogram was performed, demonstrating good flow through the outflow vein, no extravasation. Injection across the arterial anastomosis demonstrate this to be widely patent, with unremarkable appearance of the visualized native arterial circulation. The catheter and sheaths were then removed and hemostasis achieved with 2-0 Ethilon sutures. Patient tolerated procedure well.  COMPARISON:  None  FLUOROSCOPY TIME:  3 min 24 seconds  IMPRESSION: 1. Technically successful declot of left upper arm straight synthetic hemodialysis graft. 2. Technically successful balloon angioplasty of venous anastomotic stenosis. 3. Technically successful 5 and 7 mm balloon angioplasty at the arterial anastomosis.   Electronically Signed   By: Oley Balm M.D.   On: 02/14/2013 11:47    Microbiology: Recent Results (from the past 240 hour(s))  CULTURE, BLOOD (SINGLE)     Status: None   Collection Time    02/08/13  7:02 PM      Result Value Range Status   Specimen Description BLOOD HAND LEFT   Final   Special Requests BOTTLES DRAWN AEROBIC ONLY 1CC   Final   Culture  Setup Time     Final   Value: 02/09/2013 01:45     Performed at [facility]   Culture     Final   Value:        BLOOD CULTURE RECEIVED NO GROWTH TO DATE CULTURE WILL BE HELD FOR 5 DAYS BEFORE ISSUING A FINAL NEGATIVE REPORT     Performed at [facility]   Report Status PENDING   Incomplete  MRSA PCR SCREENING     Status: None   Collection Time    02/08/13  8:22 PM      Result Value Range Status   MRSA by PCR NEGATIVE  NEGATIVE Final  Comment:            The GeneXpert MRSA Assay (FDA     approved for NASAL specimens     only), is one component of a     comprehensive MRSA colonization     surveillance program. It is not      intended to diagnose MRSA     infection nor to guide or     monitor treatment for     MRSA infections.     Labs: Basic Metabolic Panel:  Recent Labs Lab 02/09/13 0402 02/10/13 0405 02/11/13 0558 02/12/13 0359 02/12/13 1945 02/13/13 0601 02/14/13 1247  NA 139 138 140 140  --  138  --   K 4.3 4.5 3.8 4.4  --  3.6  --   CL 98 99 98 99  --  97  --   CO2 25 21 27 23   --  26  --   GLUCOSE 130* 104* 120* 110* 105* 82 136*  BUN 21 42* 28* 22  --  39*  --   CREATININE 4.94* 6.54* 4.50* 3.75*  --  5.46*  --   CALCIUM 9.4 9.3 9.4 9.7  --  9.5  --   MG 2.1  --   --   --   --   --   --   PHOS 4.2 6.2* 5.3*  --   --   --   --    Liver Function Tests:  Recent Labs Lab 02/08/13 1437 02/09/13 0402 02/10/13 0405 02/11/13 0558  AST 14 16 10   --   ALT 8 7 6   --   ALKPHOS 85 87 67  --   BILITOT 0.7 0.8 0.7  --   PROT 7.7 8.0 6.9  --   ALBUMIN 2.8* 2.8* 2.6* 2.7*   No results found for this basename: LIPASE, AMYLASE,  in the last 168 hours No results found for this basename: AMMONIA,  in the last 168 hours CBC:  Recent Labs Lab 02/08/13 1437  02/09/13 0402 02/10/13 0405 02/11/13 0558 02/12/13 0359 02/13/13 0601  WBC 5.8  < > 10.2 5.6 6.1 5.0 6.1  NEUTROABS 4.0  --  9.6*  --   --   --   --   HGB 10.2*  < > 10.6* 9.2* 10.6* 11.2* 10.8*  HCT 33.7*  < > 34.0* 28.1* 34.9* 37.5* 35.1*  MCV 88.7  < > 87.9 84.1 88.8 90.4 88.6  PLT 194  < > 209 139* 140* 124* 141*  < > = values in this interval not displayed. Cardiac Enzymes:  Recent Labs Lab 02/08/13 2100 02/09/13 0205  TROPONINI <0.30 <0.30   BNP: BNP (last 3 results)  Recent Labs  12/25/12 1300 02/08/13 1903  PROBNP 32295.0* 27711.0*   CBG:  Recent Labs Lab 02/14/13 0730 02/14/13 0954 02/14/13 1129 02/14/13 1203 02/14/13 1430  GLUCAP 78 80 20* 13* 94       Signed:  Juanjose Mojica  [facility] 02/14/2013, 2:33 PM

## 2013-02-14 NOTE — Procedures (Signed)
Patient seen on Hemodialysis. QB 400, UF goal 2.5L Treatment adjusted as needed.  Zetta Bills MD [facility]. Office # [phone] Pager # [phone] 1:57 PM

## 2013-02-14 NOTE — H&P (Signed)
[name] is an 62 y.o. male.   Chief Complaint: scheduled for left upper arm dialysis fistula thrombolysis with possible angioplasty/stent placement. possible dialysis catheter placement if needed Pt dialysed Th without incident. Sat- clotted Scheduled now for procedure. Most recent declot was 07/2011- same fistula- successful. Has run well since then per pt.  HPI: HLD; anemia; CHF; PVD; smoker; HTN; ESRD; CVA  Past Medical History  Diagnosis Date  . Hyperlipidemia   . Arthritis   . Claudication   . Anemia   . Chronic diastolic CHF (congestive heart failure) 07/2010    a. 07/2008 Echo EF 50-55%, mild-mod LVH, Gr 1 DD.  Marland Kitchen Peripheral vascular occlusive disease     a. 07/2011 Periph Angio: No signif Ao-illiac dzs, LCF stenosis, 100% LSFA w recon above knee pop and 3 vessel runoff, 100% RSFA w/ recon above knee --> pending L Fem to below Knee pop bypass.  Marland Kitchen History of tobacco abuse   . Hypertension     takes Amlodipine,Metoprolol,and Prinivil daily  . Dyspnea on exertion     with exertion  . Cataract     bilateral  . Dialysis patient   . PUD (peptic ulcer disease) mid 1990's    with GI bleed.  endoscoped in mid 1990's at [facility]  . Stroke 2005    no residual  . Asthma     08/2012  . GERD (gastroesophageal reflux disease)   . History of blood transfusion     peptic ulcer  . ESRD (end stage renal disease)     a. MWF dialysis in [place] (followed by Dr. Kristian Covey)    Past Surgical History  Procedure Laterality Date  . Arteriovenous graft placement    . Av fistula placement  12/10/2000    Right brachiocephalic arteriovenous   . Aortagram  07/29/2011    Abdominal Aortagram  . Cardiac catheterization  08/01/11    Left heart catheterization  . Femoral-popliteal bypass graft  09/09/2011    Procedure: BYPASS GRAFT FEMORAL-POPLITEAL ARTERY;  Surgeon: [name], MD;  Location: [facility] OR;  Service: Vascular;  Laterality: Left;  . Amputation  09/12/2011    Procedure:  AMPUTATION BELOW KNEE;  Surgeon: [name], MD;  Location: [facility] OR;  Service: Vascular;  Laterality: Left;  TRANSMETATARSAL  . Application of wound vac  10/30/2011    Procedure: APPLICATION OF WOUND VAC;  Surgeon: [name], MD;  Location: [facility] OR;  Service: Vascular;  Laterality: Left;  . Groin debridement  11/01/2011    Procedure: Drucie Ip DEBRIDEMENT;  Surgeon: Chuck Hint, MD;  Location: [facility] OR;  Service: Vascular;  Laterality: Left;  . Esophagogastroduodenoscopy  11/06/2011    Procedure: ESOPHAGOGASTRODUODENOSCOPY (EGD);  Surgeon: Beverley Fiedler, MD;  Location: [facility] ENDOSCOPY;  Service: Gastroenterology;  Laterality: N/A;  . Femoral-popliteal bypass graft  01/02/2012    Procedure: BYPASS GRAFT FEMORAL-POPLITEAL ARTERY;  Surgeon: [name], MD;  Location: [facility] OR;  Service: Vascular;  Laterality: Right;  . Amputation  01/02/2012    Procedure: AMPUTATION RAY;  Surgeon: [name], MD;  Location: [facility] OR;  Service: Vascular;  Laterality: Right;  . Toe amputation Right     5/13- great toe  . Exchange of a dialysis catheter N/A 07/15/2012    Procedure: EXCHANGE OF A DIALYSIS CATHETER;  Surgeon: Fransisco Hertz, MD;  Location: [facility] OR;  Service: Vascular;  Laterality: N/A;  . Av fistula placement Left 07/15/2012    Procedure: ARTERIOVENOUS (AV) FISTULA CREATION;  Surgeon:  Fransisco Hertz, MD;  Location: [facility] OR;  Service: Vascular;  Laterality: Left;  . Av fistula placement Left 09/07/2012    Procedure: INSERTION OF ARTERIOVENOUS (AV) GORE-TEX GRAFT ARM;  Surgeon: Fransisco Hertz, MD;  Location: [facility] OR;  Service: Vascular;  Laterality: Left;  . Insertion of dialysis catheter Right 09/07/2012    Procedure: INSERTION OF a Femoral DIALYSIS CATHETER;  Surgeon: Fransisco Hertz, MD;  Location: [facility] OR;  Service: Vascular;  Laterality: Right;  . Lipoma excision Left 09/07/2012    Procedure: EXCISION Seroma left arm;  Surgeon: Fransisco Hertz, MD;  Location: [facility] OR;  Service: Vascular;  Laterality: Left;  . Hernia  repair      umbilical  . Thrombectomy and revision of arterioventous (av) goretex  graft Left 11/30/2012    Procedure: THROMBECTOMY AND REVISION OF ARTERIOVENTOUS (AV) GORETEX  GRAFT;  Surgeon: Chuck Hint, MD;  Location: [facility] OR;  Service: Vascular;  Laterality: Left;    Family History  Problem Relation Age of Onset  . Breast cancer Mother   . Cancer Mother   . Cancer Father   . Anesthesia problems Neg Hx   . Hypotension Neg Hx   . Malignant hyperthermia Neg Hx   . Pseudochol deficiency Neg Hx    Social History:  reports that he has been smoking Cigarettes.  He has a 4 pack-year smoking history. He has never used smokeless tobacco. He reports that he does not drink alcohol or use illicit drugs.  Allergies:  Allergies  Allergen Reactions  . Ambien [Zolpidem Tartrate] Other (See Comments)    Hallucinations    Medications Prior to Admission  Medication Sig Dispense Refill  . ALPRAZolam (XANAX) 1 MG tablet Take 1 tablet (1 mg total) by mouth 2 (two) times daily as needed for anxiety or sleep.  60 tablet  0  . calcium acetate (PHOSLO) 667 MG capsule Take 1,334 mg by mouth 2 (two) times daily.       . cinacalcet (SENSIPAR) 60 MG tablet Take 60 mg by mouth daily.      . clarithromycin (BIAXIN) 250 MG tablet Take 1 tablet (250 mg total) by mouth 2 (two) times daily.  14 tablet  0  . diclofenac (VOLTAREN) 50 MG EC tablet Take 50 mg by mouth 2 (two) times daily.      . DULoxetine (CYMBALTA) 60 MG capsule Take 60 mg by mouth daily.      . folic acid (FOLVITE) 1 MG tablet Take 1 mg by mouth daily.      Marland Kitchen gabapentin (NEURONTIN) 300 MG capsule Take 1 capsule (300 mg total) by mouth 3 (three) times daily.  90 capsule  11  . hydrALAZINE (APRESOLINE) 25 MG tablet Take 25 mg by mouth 3 (three) times daily.      . isosorbide mononitrate (IMDUR) 30 MG 24 hr tablet Take 30 mg by mouth daily.      . multivitamin (RENA-VIT) TABS tablet Take 1 tablet by mouth daily.      Marland Kitchen omeprazole (PRILOSEC)  20 MG capsule Take 20 mg by mouth daily.      Marland Kitchen oxyCODONE-acetaminophen (PERCOCET) 7.5-325 MG per tablet Take 1 tablet by mouth every 8 (eight) hours as needed for pain.  20 tablet  0  . PROAIR HFA 108 (90 BASE) MCG/ACT inhaler Inhale 1 puff into the lungs every 4 (four) hours as needed for wheezing.       Marland Kitchen tiZANidine (ZANAFLEX) 4 MG tablet Take 4 mg by  mouth every 6 (six) hours as needed for muscle spasms.         Results for orders placed during the hospital encounter of 02/08/13 (from the past 48 hour(s))  GLUCOSE, CAPILLARY     Status: Abnormal   Collection Time    02/12/13 11:57 AM      Result Value Range   Glucose-Capillary 64 (*) 70 - 99 mg/dL  GLUCOSE, CAPILLARY     Status: Abnormal   Collection Time    02/12/13 12:50 PM      Result Value Range   Glucose-Capillary 48 (*) 70 - 99 mg/dL  GLUCOSE, CAPILLARY     Status: Abnormal   Collection Time    02/12/13 12:52 PM      Result Value Range   Glucose-Capillary 35 (*) 70 - 99 mg/dL  GLUCOSE, CAPILLARY     Status: None   Collection Time    02/12/13  1:10 PM      Result Value Range   Glucose-Capillary 96  70 - 99 mg/dL  GLUCOSE, CAPILLARY     Status: Abnormal   Collection Time    02/12/13  5:16 PM      Result Value Range   Glucose-Capillary 52 (*) 70 - 99 mg/dL   Comment 1 Notify RN     Comment 2 Documented in Chart    GLUCOSE, CAPILLARY     Status: Abnormal   Collection Time    02/12/13  6:07 PM      Result Value Range   Glucose-Capillary 33 (*) 70 - 99 mg/dL  GLUCOSE, CAPILLARY     Status: Abnormal   Collection Time    02/12/13  6:10 PM      Result Value Range   Glucose-Capillary 38 (*) 70 - 99 mg/dL  GLUCOSE, RANDOM     Status: Abnormal   Collection Time    02/12/13  7:45 PM      Result Value Range   Glucose, Bld 105 (*) 70 - 99 mg/dL  GLUCOSE, CAPILLARY     Status: Abnormal   Collection Time    02/12/13  9:48 PM      Result Value Range   Glucose-Capillary 104 (*) 70 - 99 mg/dL  CBC     Status: Abnormal    Collection Time    02/13/13  6:01 AM      Result Value Range   WBC 6.1  4.0 - 10.5 K/uL   RBC 3.96 (*) 4.22 - 5.81 MIL/uL   Hemoglobin 10.8 (*) 13.0 - 17.0 g/dL   HCT 52.8 (*) 41.3 - 24.4 %   MCV 88.6  78.0 - 100.0 fL   MCH 27.3  26.0 - 34.0 pg   MCHC 30.8  30.0 - 36.0 g/dL   RDW 01.0 (*) 27.2 - 53.6 %   Platelets 141 (*) 150 - 400 K/uL   Comment: PLATELET COUNT CONFIRMED BY SMEAR  BASIC METABOLIC PANEL     Status: Abnormal   Collection Time    02/13/13  6:01 AM      Result Value Range   Sodium 138  135 - 145 mEq/L   Potassium 3.6  3.5 - 5.1 mEq/L   Comment: DELTA CHECK NOTED   Chloride 97  96 - 112 mEq/L   CO2 26  19 - 32 mEq/L   Glucose, Bld 82  70 - 99 mg/dL   BUN 39 (*) 6 - 23 mg/dL   Comment: DELTA CHECK NOTED   Creatinine, Ser 5.46 (*)  0.50 - 1.35 mg/dL   Calcium 9.5  8.4 - 09.8 mg/dL   GFR calc non Af Amer 10 (*) >90 mL/min   GFR calc Af Amer 12 (*) >90 mL/min   Comment: (NOTE)     The eGFR has been calculated using the CKD EPI equation.     This calculation has not been validated in all clinical situations.     eGFR's persistently <90 mL/min signify possible Chronic Kidney     Disease.  GLUCOSE, CAPILLARY     Status: None   Collection Time    02/13/13  7:53 AM      Result Value Range   Glucose-Capillary 89  70 - 99 mg/dL  GLUCOSE, CAPILLARY     Status: None   Collection Time    02/13/13 11:06 AM      Result Value Range   Glucose-Capillary 83  70 - 99 mg/dL  GLUCOSE, CAPILLARY     Status: None   Collection Time    02/13/13  4:19 PM      Result Value Range   Glucose-Capillary 92  70 - 99 mg/dL   Comment 1 Notify RN     Comment 2 Documented in Chart    GLUCOSE, CAPILLARY     Status: Abnormal   Collection Time    02/13/13  9:26 PM      Result Value Range   Glucose-Capillary 103 (*) 70 - 99 mg/dL  GLUCOSE, CAPILLARY     Status: None   Collection Time    02/14/13  7:30 AM      Result Value Range   Glucose-Capillary 78  70 - 99 mg/dL   Comment 1  Documented in Chart     Comment 2 Notify RN     No results found.  Review of Systems  Constitutional: Negative for fever and weight loss.  Respiratory: Negative for cough.   Cardiovascular: Negative for chest pain.  Gastrointestinal: Negative for nausea, vomiting and abdominal pain.  Neurological: Negative for dizziness and weakness.    Blood pressure 144/81, pulse 76, temperature 98 F (36.7 C), temperature source Oral, resp. rate 17, height 5\' 8"  (1.727 m), weight 136 lb 1.6 oz (61.735 kg), SpO2 97.00%. Physical Exam  Constitutional: He is oriented to person, place, and time. He appears well-nourished.  Cardiovascular: Normal rate and normal heart sounds.   No murmur heard. Respiratory: Effort normal and breath sounds normal. He has no wheezes.  GI: Soft. Bowel sounds are normal. There is no tenderness.  Musculoskeletal: Normal range of motion.  LUA fistula- no pulse; no thrill  Neurological: He is alert and oriented to person, place, and time. Coordination normal.  Skin: Skin is dry.  Psychiatric: He has a normal mood and affect. His behavior is normal. Judgment and thought content normal.     Assessment/Plan Clotted LUA dialysis fistula Last use Thur 11/20- goof flow 11/22- clotted Scheduled for thrombolysis and poss pta/stent. Poss catheter if needed Pt aware of procedure benefits and risks and agreeable to proceed Consent signed and in chart  Zhavia Cunanan A 02/14/2013, 9:09 AM

## 2013-02-14 NOTE — Progress Notes (Signed)
Physical Therapy Treatment Patient Details Name: [name] MRN: [id number] DOB: Feb 26, 1951 Today's Date: 02/14/2013 Time: 4098-1191 PT Time Calculation (min): 12 min  PT Assessment / Plan / Recommendation  History of Present Illness Pt is a 62 y.o. male adm from home secondary to AMS.    PT Comments   Pt progressing well with therapy. Required min cues for safety during transfers. Pt safe from mobility standpoint to D/c home. Encouraged pt to amb with least restrictive AD to increase stability with gt;pt agreeable.   Follow Up Recommendations  No PT follow up;Supervision for mobility/OOB;Supervision - Intermittent     Does the patient have the potential to tolerate intense rehabilitation     Barriers to Discharge        Equipment Recommendations  None recommended by PT    Recommendations for Other Services OT consult  Frequency Min 3X/week   Progress towards PT Goals Progress towards PT goals: Progressing toward goals  Plan Current plan remains appropriate    Precautions / Restrictions Precautions Precautions: None Precaution Comments: denies any recent falls Restrictions Weight Bearing Restrictions: No   Pertinent Vitals/Pain No new complaints.     Mobility  Bed Mobility Bed Mobility: Supine to Sit;Sitting - Scoot to Edge of Bed;Sit to Supine Supine to Sit: 6: Modified independent (Device/Increase time);With rails Sitting - Scoot to Edge of Bed: 7: Independent Sit to Supine: 6: Modified independent (Device/Increase time);HOB flat Details for Bed Mobility Assistance: pt demo good technique; used handrails; no physical (A) needed Transfers Transfers: Stand to Sit;Sit to Stand Sit to Stand: 5: Supervision;From bed Stand to Sit: 5: Supervision;To bed Details for Transfer Assistance: supervision for min cues for hand placement and safety  Ambulation/Gait Ambulation/Gait Assistance: 6: Modified independent (Device/Increase time) Ambulation Distance (Feet): 150  Feet Assistive device: Rolling walker Ambulation/Gait Assistance Details: pt demo good technique with ambulating; able to converse and no SOB or LOB noted Gait Pattern: Within Functional Limits Gait velocity: WFL Stairs: No Wheelchair Mobility Wheelchair Mobility: No    Exercises  pt being transferred to dialysis.    PT Diagnosis:    PT Problem List:   PT Treatment Interventions:     PT Goals (current goals can now be found in the care plan section) Acute Rehab PT Goals PT Goal Formulation: With patient Time For Goal Achievement: 02/26/13 Potential to Achieve Goals: Good  Visit Information  Last PT Received On: 02/14/13 Assistance Needed: +1 History of Present Illness: Pt is a 62 y.o. male adm from home secondary to AMS.     Subjective Data  Subjective: pt lying supine; agreeable to walk. "im going home today after dialysis"    Cognition  Cognition Arousal/Alertness: Awake/alert Behavior During Therapy: WFL for tasks assessed/performed Overall Cognitive Status: Within Functional Limits for tasks assessed    Balance  Balance Balance Assessed: No  End of Session PT - End of Session Equipment Utilized During Treatment: Gait belt Activity Tolerance: Patient tolerated treatment well Patient left: in bed;with call bell/phone within reach;with nursing/sitter in room Nurse Communication: Mobility status   GP     Donell Sievert, [place] [phone] 02/14/2013, 3:12 PM

## 2013-02-14 NOTE — Significant Event (Signed)
CBG: 20  Treatment: 15 GM carbohydrate snack  Symptoms: None  Follow-up CBG: Time:1203 CBG Result:13  Possible Reasons for Event: Inadequate meal intake  Comments/MD notified: Dr. Ellwood Sayers RN

## 2013-02-14 NOTE — Progress Notes (Signed)
Pt discharged to home after visit summary reviewed and pt capable of re verbalizing medications and follow up appointments. Pt remains stable. No signs and symptoms of distress. Educated to return to ER in the event of SOB, dizziness, chest pain, or fainting. Clancy Mullarkey, RN   

## 2013-02-14 NOTE — Progress Notes (Signed)
OT Cancellation Note  Patient Details Name: HARLON KUTNER MRN: [id number] DOB: 1950/06/28   Cancelled Treatment:    Reason Eval/Treat Not Completed: Patient at procedure or test/ unavailable--interventional radiology.  Evette Georges [phone] 02/14/2013, 10:17 AM

## 2013-02-14 NOTE — ED Notes (Signed)
O2 sat probe moved to many different locations, only able to pick up one sat

## 2013-02-14 NOTE — Significant Event (Signed)
CBG: 13  Treatment: D50 IV 25 mL  Symptoms: None  Follow-up CBG: Time:1247 CBG Result:136  Possible Reasons for Event: Unknown  Comments/MD notified: Dr. Ellwood Sayers RN

## 2013-02-15 LAB — CULTURE, BLOOD (SINGLE): Culture: NO GROWTH

## 2013-02-21 ENCOUNTER — Other Ambulatory Visit (HOSPITAL_COMMUNITY): Payer: Self-pay | Admitting: Nephrology

## 2013-02-21 DIAGNOSIS — N186 End stage renal disease: Secondary | ICD-10-CM

## 2013-02-21 DIAGNOSIS — T82868S Thrombosis of vascular prosthetic devices, implants and grafts, sequela: Secondary | ICD-10-CM

## 2013-02-21 DIAGNOSIS — T82868A Thrombosis of vascular prosthetic devices, implants and grafts, initial encounter: Secondary | ICD-10-CM

## 2013-02-22 ENCOUNTER — Other Ambulatory Visit (HOSPITAL_COMMUNITY): Payer: Self-pay | Admitting: Nephrology

## 2013-02-22 ENCOUNTER — Ambulatory Visit (INDEPENDENT_AMBULATORY_CARE_PROVIDER_SITE_OTHER): Payer: Medicare Other | Admitting: General Practice

## 2013-02-22 ENCOUNTER — Ambulatory Visit (HOSPITAL_COMMUNITY)
Admission: RE | Admit: 2013-02-22 | Discharge: 2013-02-22 | Disposition: A | Discharge status: Home / Self Care | Payer: Medicare Other | Source: Ambulatory Visit | Attending: Nephrology | Admitting: Nephrology

## 2013-02-22 ENCOUNTER — Encounter: Payer: Self-pay | Admitting: General Practice

## 2013-02-22 ENCOUNTER — Encounter (HOSPITAL_COMMUNITY): Payer: Self-pay

## 2013-02-22 VITALS — BP 98/46 | HR 81 | Temp 96.8°F | Ht 68.0 in | Wt 146.0 lb

## 2013-02-22 VITALS — BP 132/41 | HR 74 | Resp 20

## 2013-02-22 DIAGNOSIS — M129 Arthropathy, unspecified: Secondary | ICD-10-CM | POA: Insufficient documentation

## 2013-02-22 DIAGNOSIS — Y832 Surgical operation with anastomosis, bypass or graft as the cause of abnormal reaction of the patient, or of later complication, without mention of misadventure at the time of the procedure: Secondary | ICD-10-CM | POA: Insufficient documentation

## 2013-02-22 DIAGNOSIS — I509 Heart failure, unspecified: Secondary | ICD-10-CM

## 2013-02-22 DIAGNOSIS — Z79899 Other long term (current) drug therapy: Secondary | ICD-10-CM | POA: Insufficient documentation

## 2013-02-22 DIAGNOSIS — T82898A Other specified complication of vascular prosthetic devices, implants and grafts, initial encounter: Secondary | ICD-10-CM | POA: Insufficient documentation

## 2013-02-22 DIAGNOSIS — J45909 Unspecified asthma, uncomplicated: Secondary | ICD-10-CM | POA: Insufficient documentation

## 2013-02-22 DIAGNOSIS — K219 Gastro-esophageal reflux disease without esophagitis: Secondary | ICD-10-CM | POA: Insufficient documentation

## 2013-02-22 DIAGNOSIS — I739 Peripheral vascular disease, unspecified: Secondary | ICD-10-CM | POA: Insufficient documentation

## 2013-02-22 DIAGNOSIS — Z833 Family history of diabetes mellitus: Secondary | ICD-10-CM

## 2013-02-22 DIAGNOSIS — S88119A Complete traumatic amputation at level between knee and ankle, unspecified lower leg, initial encounter: Secondary | ICD-10-CM | POA: Insufficient documentation

## 2013-02-22 DIAGNOSIS — I5032 Chronic diastolic (congestive) heart failure: Secondary | ICD-10-CM | POA: Insufficient documentation

## 2013-02-22 DIAGNOSIS — T82868A Thrombosis of vascular prosthetic devices, implants and grafts, initial encounter: Secondary | ICD-10-CM

## 2013-02-22 DIAGNOSIS — E785 Hyperlipidemia, unspecified: Secondary | ICD-10-CM | POA: Insufficient documentation

## 2013-02-22 DIAGNOSIS — Z992 Dependence on renal dialysis: Secondary | ICD-10-CM | POA: Insufficient documentation

## 2013-02-22 DIAGNOSIS — I120 Hypertensive chronic kidney disease with stage 5 chronic kidney disease or end stage renal disease: Secondary | ICD-10-CM | POA: Insufficient documentation

## 2013-02-22 DIAGNOSIS — N186 End stage renal disease: Secondary | ICD-10-CM | POA: Insufficient documentation

## 2013-02-22 DIAGNOSIS — Z8673 Personal history of transient ischemic attack (TIA), and cerebral infarction without residual deficits: Secondary | ICD-10-CM | POA: Insufficient documentation

## 2013-02-22 DIAGNOSIS — Z87891 Personal history of nicotine dependence: Secondary | ICD-10-CM | POA: Insufficient documentation

## 2013-02-22 MED ORDER — IOHEXOL 300 MG/ML  SOLN
100.0000 mL | Freq: Once | INTRAMUSCULAR | Status: AC | PRN
  Administered 2013-02-22: 50 mL via INTRAVENOUS

## 2013-02-22 MED ORDER — HEPARIN SODIUM (PORCINE) 1000 UNIT/ML IJ SOLN
INTRAMUSCULAR | Status: AC | PRN
  Administered 2013-02-22: 3000 [IU] via INTRAVENOUS

## 2013-02-22 MED ORDER — FENTANYL CITRATE 0.05 MG/ML IJ SOLN
INTRAMUSCULAR | Status: AC
  Filled 2013-02-22: qty 4

## 2013-02-22 MED ORDER — ALTEPLASE 100 MG IV SOLR
4.0000 mg | Freq: Once | INTRAVENOUS | Status: AC
  Administered 2013-02-22: 2 mg
  Filled 2013-02-22: qty 4

## 2013-02-22 MED ORDER — HEPARIN SODIUM (PORCINE) 1000 UNIT/ML IJ SOLN
INTRAMUSCULAR | Status: AC
  Filled 2013-02-22: qty 1

## 2013-02-22 NOTE — Procedures (Signed)
Successful LUE AVG DECLOT W/ VENOUS PTA NO COMP STABLE FULL REPORT IN PACS READY FOR USE

## 2013-02-22 NOTE — Progress Notes (Signed)
   Subjective:    Patient ID: [name], male    DOB: 1950/09/21, 62 y.o.   MRN: [id number]  HPI Patient presents today for hospital follow up. He was admitted to hospital on 02/08/13 for shortness of breath. Diagnosed with CHF and encephalopathy. Discharged on 02/14/13 and denies shortness of breath since then. He denies a follow up visit with cardiologist. He has had two recent admission to hospital for fluid volume overload. He reports a family history of diabetes and would like to be tested.     Review of Systems  Constitutional: Negative for fever and chills.  Respiratory: Negative for cough, chest tightness, shortness of breath and wheezing.   Cardiovascular: Negative for chest pain and palpitations.  Genitourinary:       Anuric. Hemodialysis three days weekly.   Neurological: Negative for dizziness, weakness and headaches.       Objective:   Physical Exam  Constitutional: He is oriented to person, place, and time. He appears well-developed and well-nourished.  Cardiovascular: Normal rate and regular rhythm.   Distant heart sounds  Pulmonary/Chest: Effort normal. No respiratory distress. He has no wheezes. He has no rales. He exhibits no tenderness.  Diminished breath sounds throughout  Neurological: He is alert and oriented to person, place, and time.  Skin: Skin is warm and dry.  Psychiatric: He has a normal mood and affect.          Assessment & Plan:  1. CHF (congestive heart failure)  - Ambulatory referral to Cardiology  2. Family history of diabetes mellitus  - POCT glycosylated hemoglobin (Hb A1C); Future -RTO if symptoms develop, prn and scheduled appointments -make seek emergency medical treatment if needed -Patient verbalized understanding Coralie Keens, FNP-C

## 2013-02-22 NOTE — ED Notes (Signed)
Pt not receiving sedation as he is riding public transportation home

## 2013-02-22 NOTE — H&P (Signed)
Chief Complaint: "I'm here for a declot" HPI: [name] is an 62 y.o. male with ESRD. He was here on 11/24 for declot procedure. He has had HD 3 times with no issue since then. They tried to access it yesterday but it was again clotted. Pt referred back for repeat declot. PMHx and meds reviewed. Pt only ride is public transportation today so cannot have full sedation.   Past Medical History:  Past Medical History  Diagnosis Date  . Hyperlipidemia   . Arthritis   . Claudication   . Anemia   . Chronic diastolic CHF (congestive heart failure) 07/2010    a. 07/2008 Echo EF 50-55%, mild-mod LVH, Gr 1 DD.  Marland Kitchen Peripheral vascular occlusive disease     a. 07/2011 Periph Angio: No signif Ao-illiac dzs, LCF stenosis, 100% LSFA w recon above knee pop and 3 vessel runoff, 100% RSFA w/ recon above knee --> pending L Fem to below Knee pop bypass.  Marland Kitchen History of tobacco abuse   . Hypertension     takes Amlodipine,Metoprolol,and Prinivil daily  . Dyspnea on exertion     with exertion  . Cataract     bilateral  . Dialysis patient   . PUD (peptic ulcer disease) mid 1990's    with GI bleed.  endoscoped in mid 1990's at [facility]  . Stroke 2005    no residual  . Asthma     08/2012  . GERD (gastroesophageal reflux disease)   . History of blood transfusion     peptic ulcer  . ESRD (end stage renal disease)     a. MWF dialysis in [place] (followed by Dr. Kristian Covey)    Past Surgical History:  Past Surgical History  Procedure Laterality Date  . Arteriovenous graft placement    . Av fistula placement  12/10/2000    Right brachiocephalic arteriovenous   . Aortagram  07/29/2011    Abdominal Aortagram  . Cardiac catheterization  08/01/11    Left heart catheterization  . Femoral-popliteal bypass graft  09/09/2011    Procedure: BYPASS GRAFT FEMORAL-POPLITEAL ARTERY;  Surgeon: [name], MD;  Location: [facility] OR;  Service: Vascular;  Laterality: Left;  . Amputation  09/12/2011    Procedure:  AMPUTATION BELOW KNEE;  Surgeon: [name], MD;  Location: [facility] OR;  Service: Vascular;  Laterality: Left;  TRANSMETATARSAL  . Application of wound vac  10/30/2011    Procedure: APPLICATION OF WOUND VAC;  Surgeon: [name], MD;  Location: [facility] OR;  Service: Vascular;  Laterality: Left;  . Groin debridement  11/01/2011    Procedure: Drucie Ip DEBRIDEMENT;  Surgeon: Chuck Hint, MD;  Location: [facility] OR;  Service: Vascular;  Laterality: Left;  . Esophagogastroduodenoscopy  11/06/2011    Procedure: ESOPHAGOGASTRODUODENOSCOPY (EGD);  Surgeon: Beverley Fiedler, MD;  Location: [facility] ENDOSCOPY;  Service: Gastroenterology;  Laterality: N/A;  . Femoral-popliteal bypass graft  01/02/2012    Procedure: BYPASS GRAFT FEMORAL-POPLITEAL ARTERY;  Surgeon: [name], MD;  Location: [facility] OR;  Service: Vascular;  Laterality: Right;  . Amputation  01/02/2012    Procedure: AMPUTATION RAY;  Surgeon: [name], MD;  Location: [facility] OR;  Service: Vascular;  Laterality: Right;  . Toe amputation Right     5/13- great toe  . Exchange of a dialysis catheter N/A 07/15/2012    Procedure: EXCHANGE OF A DIALYSIS CATHETER;  Surgeon: Fransisco Hertz, MD;  Location: [facility] OR;  Service: Vascular;  Laterality: N/A;  . Av fistula  placement Left 07/15/2012    Procedure: ARTERIOVENOUS (AV) FISTULA CREATION;  Surgeon: Fransisco Hertz, MD;  Location: [facility] OR;  Service: Vascular;  Laterality: Left;  . Av fistula placement Left 09/07/2012    Procedure: INSERTION OF ARTERIOVENOUS (AV) GORE-TEX GRAFT ARM;  Surgeon: Fransisco Hertz, MD;  Location: [facility] OR;  Service: Vascular;  Laterality: Left;  . Insertion of dialysis catheter Right 09/07/2012    Procedure: INSERTION OF a Femoral DIALYSIS CATHETER;  Surgeon: Fransisco Hertz, MD;  Location: [facility] OR;  Service: Vascular;  Laterality: Right;  . Lipoma excision Left 09/07/2012    Procedure: EXCISION Seroma left arm;  Surgeon: Fransisco Hertz, MD;  Location: [facility] OR;  Service: Vascular;  Laterality: Left;  . Hernia  repair      umbilical  . Thrombectomy and revision of arterioventous (av) goretex  graft Left 11/30/2012    Procedure: THROMBECTOMY AND REVISION OF ARTERIOVENTOUS (AV) GORETEX  GRAFT;  Surgeon: Chuck Hint, MD;  Location: [facility] OR;  Service: Vascular;  Laterality: Left;    Family History:  Family History  Problem Relation Age of Onset  . Breast cancer Mother   . Cancer Mother   . Cancer Father   . Anesthesia problems Neg Hx   . Hypotension Neg Hx   . Malignant hyperthermia Neg Hx   . Pseudochol deficiency Neg Hx     Social History:  reports that he quit smoking about 4 weeks ago. His smoking use included Cigarettes. He has a 4 pack-year smoking history. He has never used smokeless tobacco. He reports that he does not drink alcohol or use illicit drugs.  Allergies:  Allergies  Allergen Reactions  . Ambien [Zolpidem Tartrate] Other (See Comments)    Hallucinations    Medications:   Medication List    ASK your doctor about these medications       ALPRAZolam 1 MG tablet  Commonly known as:  XANAX  Take 1 tablet (1 mg total) by mouth 2 (two) times daily as needed for anxiety or sleep.     cinacalcet 60 MG tablet  Commonly known as:  SENSIPAR  Take 60 mg by mouth daily.     diclofenac 50 MG EC tablet  Commonly known as:  VOLTAREN  Take 50 mg by mouth 2 (two) times daily.     DULoxetine 60 MG capsule  Commonly known as:  CYMBALTA  Take 60 mg by mouth daily.     folic acid 1 MG tablet  Commonly known as:  FOLVITE  Take 1 mg by mouth daily.     gabapentin 300 MG capsule  Commonly known as:  NEURONTIN  Take 1 capsule (300 mg total) by mouth 3 (three) times daily.     hydrALAZINE 25 MG tablet  Commonly known as:  APRESOLINE  Take 25 mg by mouth 3 (three) times daily.     isosorbide mononitrate 30 MG 24 hr tablet  Commonly known as:  IMDUR  Take 30 mg by mouth daily.     levofloxacin 500 MG tablet  Commonly known as:  LEVAQUIN  Take 1 tablet (500 mg total)  by mouth every other day. For 4 days     multivitamin Tabs tablet  Take 1 tablet by mouth daily.     omeprazole 20 MG capsule  Commonly known as:  PRILOSEC  Take 20 mg by mouth daily.     oxyCODONE-acetaminophen 7.5-325 MG per tablet  Commonly known as:  PERCOCET  Take 1 tablet by  mouth every 8 (eight) hours as needed for pain.     PHOSLO 667 MG capsule  Generic drug:  calcium acetate  Take 1,334 mg by mouth 2 (two) times daily.     PROAIR HFA 108 (90 BASE) MCG/ACT inhaler  Generic drug:  albuterol  Inhale 1 puff into the lungs every 4 (four) hours as needed for wheezing.     tiZANidine 4 MG tablet  Commonly known as:  ZANAFLEX  Take 4 mg by mouth every 6 (six) hours as needed for muscle spasms.        Please HPI for pertinent positives, otherwise complete 10 system ROS negative.  Physical Exam: BP 93/53  Resp 14  SpO2 100% There is no weight on file to calculate BMI.   General Appearance:  Alert, cooperative, no distress, appears stated age  Head:  Normocephalic, without obvious abnormality, atraumatic  ENT: Unremarkable  Neck: Supple, symmetrical, trachea midline  Lungs:   Clear to auscultation bilaterally, no w/r/r, respirations unlabored without use of accessory muscles.  Chest Wall:  No tenderness or deformity  Heart:  Regular rate and rhythm, S1, S2 normal, no murmur, rub or gallop.  Extremities: (L)UE AVG without pulse, hand warm.  Pulses: 2+ and symmetric  Neurologic: Normal affect, no gross deficits.   No results found for this or any previous visit (from the past 48 hour(s)). No results found.  Assessment/Plan Clotted left AVG Discussed thrombolysis/thrombectomy of AV Graft/Fistula, possible angioplasty, possible stent, possible HD catheter placement if necessary. Risks, benefits, use of sedation thoroughly explained.  Brayton El PA-C 02/22/2013, 11:35 AM

## 2013-02-22 NOTE — Patient Instructions (Signed)

## 2013-02-24 ENCOUNTER — Other Ambulatory Visit (HOSPITAL_COMMUNITY): Payer: Self-pay | Admitting: Nephrology

## 2013-02-24 ENCOUNTER — Ambulatory Visit: Payer: Medicare Other | Admitting: Cardiovascular Disease

## 2013-02-24 DIAGNOSIS — T82868A Thrombosis of vascular prosthetic devices, implants and grafts, initial encounter: Secondary | ICD-10-CM

## 2013-03-02 LAB — POCT I-STAT, CHEM 8
BUN: 43 mg/dL — ABNORMAL HIGH (ref 6–23)
Calcium, Ion: 1.16 mmol/L (ref 1.13–1.30)
Chloride: 104 mEq/L (ref 96–112)
Creatinine, Ser: 9.7 mg/dL — ABNORMAL HIGH (ref 0.50–1.35)
Glucose, Bld: 79 mg/dL (ref 70–99)
Potassium: 4.1 mEq/L (ref 3.5–5.1)
TCO2: 26 mmol/L (ref 0–100)

## 2013-03-03 ENCOUNTER — Other Ambulatory Visit: Payer: Self-pay | Admitting: *Deleted

## 2013-03-03 DIAGNOSIS — F411 Generalized anxiety disorder: Secondary | ICD-10-CM

## 2013-03-03 DIAGNOSIS — G4700 Insomnia, unspecified: Secondary | ICD-10-CM

## 2013-03-03 NOTE — Telephone Encounter (Signed)
Last ov 02/22/13. Call in [facility].

## 2013-03-04 ENCOUNTER — Telehealth: Payer: Self-pay | Admitting: *Deleted

## 2013-03-04 MED ORDER — ALPRAZOLAM 1 MG PO TABS: 1.0000 mg | ORAL_TABLET | Freq: Two times a day (BID) | ORAL | Status: DC | PRN

## 2013-03-04 NOTE — Telephone Encounter (Signed)
Please inform script ready for pick up 

## 2013-03-04 NOTE — Telephone Encounter (Signed)
Called in.

## 2013-03-04 NOTE — Telephone Encounter (Signed)
Aware,xanax rx ready.

## 2013-03-08 ENCOUNTER — Ambulatory Visit (INDEPENDENT_AMBULATORY_CARE_PROVIDER_SITE_OTHER): Payer: Medicare Other | Admitting: Cardiology

## 2013-03-08 ENCOUNTER — Encounter: Payer: Self-pay | Admitting: Cardiology

## 2013-03-08 VITALS — BP 185/66 | HR 67 | Ht 66.0 in | Wt 138.1 lb

## 2013-03-08 DIAGNOSIS — I2510 Atherosclerotic heart disease of native coronary artery without angina pectoris: Secondary | ICD-10-CM

## 2013-03-08 DIAGNOSIS — R002 Palpitations: Secondary | ICD-10-CM

## 2013-03-08 NOTE — Patient Instructions (Signed)
Your physician recommends that you schedule a follow-up appointment in: 3 month with Dr. Wyline Mood. This appointment will be scheduled today before you leave.  Your physician has recommended that you wear a 7 day event monitor. Event monitors are medical devices that record the heart's electrical activity. Doctors most often [place] these monitors to diagnose arrhythmias. Arrhythmias are problems with the speed or rhythm of the heartbeat. The monitor is a small, portable device. You can wear one while you do your normal daily activities. This is usually used to diagnose what is causing palpitations/syncope (passing out).  Your physician has recommended you make the following change in your medication:  Stop: Hydralazine Stop: Isosorbide Mononitrate (IMDUR)   Continue all other medications the same.

## 2013-03-08 NOTE — Progress Notes (Signed)
Clinical Summary [name] is a 62 y.o.male seen today for the following medicla problems.  1. Chronic diastolic heart failure - recent admit 01/2013 with respiratory failure requiring intubation, secondary to volume overload and possible HCAP - he is anuric, volume removal is only per fluid removal.   2. Afib - episode 07/2011 during hopsital admission with sepsis.  - reports feeling of heart racing, feels weak all over with SOB. Occurs approx every other day. Symptoms started approx 2 month ago.   3. CAD  - prior cath as described below - denies any recent chest pain  Past Medical History  Diagnosis Date  . Hyperlipidemia   . Arthritis   . Claudication   . Anemia   . Chronic diastolic CHF (congestive heart failure) 07/2010    a. 07/2008 Echo EF 50-55%, mild-mod LVH, Gr 1 DD.  Marland Kitchen Peripheral vascular occlusive disease     a. 07/2011 Periph Angio: No signif Ao-illiac dzs, LCF stenosis, 100% LSFA w recon above knee pop and 3 vessel runoff, 100% RSFA w/ recon above knee --> pending L Fem to below Knee pop bypass.  Marland Kitchen History of tobacco abuse   . Hypertension     takes Amlodipine,Metoprolol,and Prinivil daily  . Dyspnea on exertion     with exertion  . Cataract     bilateral  . Dialysis patient   . PUD (peptic ulcer disease) mid 1990's    with GI bleed.  endoscoped in mid 1990's at [facility]  . Stroke 2005    no residual  . Asthma     08/2012  . GERD (gastroesophageal reflux disease)   . History of blood transfusion     peptic ulcer  . ESRD (end stage renal disease)     a. MWF dialysis in [place] (followed by Dr. Kristian Covey)     Allergies  Allergen Reactions  . Ambien [Zolpidem Tartrate] Other (See Comments)    Hallucinations     Current Outpatient Prescriptions  Medication Sig Dispense Refill  . ALPRAZolam (XANAX) 1 MG tablet Take 1 tablet (1 mg total) by mouth 2 (two) times daily as needed for anxiety or sleep.  60 tablet  2  . calcium acetate (PHOSLO)  667 MG capsule Take 1,334 mg by mouth 2 (two) times daily.       . cinacalcet (SENSIPAR) 60 MG tablet Take 60 mg by mouth daily.      . diclofenac (VOLTAREN) 50 MG EC tablet Take 50 mg by mouth 2 (two) times daily.      . DULoxetine (CYMBALTA) 60 MG capsule Take 60 mg by mouth daily.      . folic acid (FOLVITE) 1 MG tablet Take 1 mg by mouth daily.      Marland Kitchen gabapentin (NEURONTIN) 300 MG capsule Take 1 capsule (300 mg total) by mouth 3 (three) times daily.  90 capsule  11  . hydrALAZINE (APRESOLINE) 25 MG tablet Take 25 mg by mouth 3 (three) times daily.      . isosorbide mononitrate (IMDUR) 30 MG 24 hr tablet Take 30 mg by mouth daily.      Marland Kitchen levofloxacin (LEVAQUIN) 500 MG tablet Take 1 tablet (500 mg total) by mouth every other day. For 4 days  2 tablet  0  . multivitamin (RENA-VIT) TABS tablet Take 1 tablet by mouth daily.      Marland Kitchen omeprazole (PRILOSEC) 20 MG capsule Take 20 mg by mouth daily.      Marland Kitchen oxyCODONE-acetaminophen (PERCOCET)  7.5-325 MG per tablet Take 1 tablet by mouth every 8 (eight) hours as needed for pain.  20 tablet  0  . PROAIR HFA 108 (90 BASE) MCG/ACT inhaler Inhale 1 puff into the lungs every 4 (four) hours as needed for wheezing.       Marland Kitchen tiZANidine (ZANAFLEX) 4 MG tablet Take 4 mg by mouth every 6 (six) hours as needed for muscle spasms.        No current facility-administered medications for this visit.     Past Surgical History  Procedure Laterality Date  . Arteriovenous graft placement    . Av fistula placement  12/10/2000    Right brachiocephalic arteriovenous   . Aortagram  07/29/2011    Abdominal Aortagram  . Cardiac catheterization  08/01/11    Left heart catheterization  . Femoral-popliteal bypass graft  09/09/2011    Procedure: BYPASS GRAFT FEMORAL-POPLITEAL ARTERY;  Surgeon: [name], MD;  Location: [facility] OR;  Service: Vascular;  Laterality: Left;  . Amputation  09/12/2011    Procedure: AMPUTATION BELOW KNEE;  Surgeon: [name], MD;  Location: [facility] OR;   Service: Vascular;  Laterality: Left;  TRANSMETATARSAL  . Application of wound vac  10/30/2011    Procedure: APPLICATION OF WOUND VAC;  Surgeon: [name], MD;  Location: [facility] OR;  Service: Vascular;  Laterality: Left;  . Groin debridement  11/01/2011    Procedure: Drucie Ip DEBRIDEMENT;  Surgeon: Chuck Hint, MD;  Location: [facility] OR;  Service: Vascular;  Laterality: Left;  . Esophagogastroduodenoscopy  11/06/2011    Procedure: ESOPHAGOGASTRODUODENOSCOPY (EGD);  Surgeon: Beverley Fiedler, MD;  Location: [facility] ENDOSCOPY;  Service: Gastroenterology;  Laterality: N/A;  . Femoral-popliteal bypass graft  01/02/2012    Procedure: BYPASS GRAFT FEMORAL-POPLITEAL ARTERY;  Surgeon: [name], MD;  Location: [facility] OR;  Service: Vascular;  Laterality: Right;  . Amputation  01/02/2012    Procedure: AMPUTATION RAY;  Surgeon: [name], MD;  Location: [facility] OR;  Service: Vascular;  Laterality: Right;  . Toe amputation Right     5/13- great toe  . Exchange of a dialysis catheter N/A 07/15/2012    Procedure: EXCHANGE OF A DIALYSIS CATHETER;  Surgeon: Fransisco Hertz, MD;  Location: [facility] OR;  Service: Vascular;  Laterality: N/A;  . Av fistula placement Left 07/15/2012    Procedure: ARTERIOVENOUS (AV) FISTULA CREATION;  Surgeon: Fransisco Hertz, MD;  Location: [facility] OR;  Service: Vascular;  Laterality: Left;  . Av fistula placement Left 09/07/2012    Procedure: INSERTION OF ARTERIOVENOUS (AV) GORE-TEX GRAFT ARM;  Surgeon: Fransisco Hertz, MD;  Location: [facility] OR;  Service: Vascular;  Laterality: Left;  . Insertion of dialysis catheter Right 09/07/2012    Procedure: INSERTION OF a Femoral DIALYSIS CATHETER;  Surgeon: Fransisco Hertz, MD;  Location: [facility] OR;  Service: Vascular;  Laterality: Right;  . Lipoma excision Left 09/07/2012    Procedure: EXCISION Seroma left arm;  Surgeon: Fransisco Hertz, MD;  Location: [facility] OR;  Service: Vascular;  Laterality: Left;  . Hernia repair      umbilical  . Thrombectomy and revision of arterioventous (av)  goretex  graft Left 11/30/2012    Procedure: THROMBECTOMY AND REVISION OF ARTERIOVENTOUS (AV) GORETEX  GRAFT;  Surgeon: Chuck Hint, MD;  Location: [place] OR;  Service: Vascular;  Laterality: Left;     Allergies  Allergen Reactions  . Ambien [Zolpidem Tartrate] Other (See Comments)    Hallucinations      Family  History  Problem Relation Age of Onset  . Breast cancer Mother   . Cancer Mother   . Cancer Father   . Anesthesia problems Neg Hx   . Hypotension Neg Hx   . Malignant hyperthermia Neg Hx   . Pseudochol deficiency Neg Hx      Social History [name] reports that he quit smoking about 6 weeks ago. His smoking use included Cigarettes. He has a 4 pack-year smoking history. He has never used smokeless tobacco. [name] reports that he does not drink alcohol.   Review of Systems CONSTITUTIONAL: No weight loss, fever, chills, weakness or fatigue.  HEENT: Eyes: No visual loss, blurred vision, double vision or yellow sclerae.No hearing loss, sneezing, congestion, runny nose or sore throat.  SKIN: No rash or itching.  CARDIOVASCULAR: per HPI RESPIRATORY: No shortness of breath, cough or sputum.  GASTROINTESTINAL: No anorexia, nausea, vomiting or diarrhea. No abdominal pain or blood.  GENITOURINARY: No burning on urination, no polyuria NEUROLOGICAL: No headache, dizziness, syncope, paralysis, ataxia, numbness or tingling in the extremities. No change in bowel or bladder control.  MUSCULOSKELETAL: No muscle, back pain, joint pain or stiffness.  LYMPHATICS: No enlarged nodes. No history of splenectomy.  PSYCHIATRIC: No history of depression or anxiety.  ENDOCRINOLOGIC: No reports of sweating, cold or heat intolerance. No polyuria or polydipsia.  Marland Kitchen   Physical Examination Gen: resting comfortably, no acute distress HEENT: no scleral icterus, pupils equal round and reactive, no palptable cervical adenopathy,  CV: RRR, no m/r/g, no JVD, no carotid bruits Resp:  Clear to auscultation bilaterally GI: abdomen is soft, non-tender, non-distended, normal bowel sounds, no hepatosplenomegaly MSK: extremities are warm, no edema.  Skin: warm, no rash Neuro:  no focal deficits Psych: appropriate affect   Diagnostic Studies 01/2013 Echo LVEF 50-55%, moderate LVH, no WMAs, diastolic dysfunction not described,   07/2011 Cath Hemodynamics:  AO 162/64  LV 169/21  Coronary angiography:  Coronary dominance: Right  Left mainstem: Heavily calcified. Distal 50%.  Left anterior descending (LAD): Heavy calcification. Ostial 50%. Large vessel wrapping the apex. D1 moderate and normal.  Left circumflex (LCx): Large vessel. AV groove normal. OM1 large and luminal irregularities. OM2 moderate and normal. PL large and branching and normal.  Right coronary artery (RCA): Dominant. Long subtotal chronic occlusion with TIMI 1 flow. Distal vessel with scant left to right collaterals.  Left ventriculography: Left ventricular systolic function is decreased, LVEF is estimated at 50 with moderate inferior akinesis, there is no significant mitral regurgitation  Final Conclusions: Single vessel obstructive CAD. Moderate nonobstructive plaque elsewhere. Mild LV dysfunction.  Recommendations: Medical management and risk factor modification. The patient will be at acceptable risk for the planned vascular surgery according to ACC/AHA guidelines.    Assessment and Plan  1. Chronic diastolic heart failure - appears euvolemic - continue bp control  2. Afib - reports symptoms of palpitations - unclear if afib was isolated episode in setting of sepsis or if recurring issue - obtain event monitor  3. CAD - no current symptoms - continue current medications      Antoine Poche, M.D., F.A.C.C.

## 2013-03-28 ENCOUNTER — Telehealth: Payer: Self-pay | Admitting: *Deleted

## 2013-03-28 NOTE — Telephone Encounter (Signed)
One day history of SOB. Hx of CHF and renal failure.  Patient is scheduled for dialysis today. He leaves at 10 and it isn't over until 4. They called at 9 and I advised that we wouldn't be able to get him in and out in time to meet the bus for dialysis at 10. After 4 we wouldn't have time to run labs and get them back today. He wants to go to dialysis and I avised patient to go the ED if he doesn't feel better after dialysis. He believes that once he has dialysis he will feel better. He agreed to go to the ED if he doesn't.

## 2013-03-29 ENCOUNTER — Ambulatory Visit: Payer: Medicare Other | Admitting: General Practice

## 2013-03-30 ENCOUNTER — Emergency Department (HOSPITAL_COMMUNITY)
Admission: EM | Admit: 2013-03-30 | Discharge: 2013-03-30 | Disposition: A | Discharge status: Home / Self Care | Payer: Medicare Other | Source: Home / Self Care | Attending: Emergency Medicine | Admitting: Emergency Medicine

## 2013-03-30 ENCOUNTER — Emergency Department (HOSPITAL_COMMUNITY): Payer: Medicare Other

## 2013-03-30 ENCOUNTER — Encounter (HOSPITAL_COMMUNITY): Payer: Self-pay | Admitting: Emergency Medicine

## 2013-03-30 DIAGNOSIS — Z8673 Personal history of transient ischemic attack (TIA), and cerebral infarction without residual deficits: Secondary | ICD-10-CM

## 2013-03-30 DIAGNOSIS — K219 Gastro-esophageal reflux disease without esophagitis: Secondary | ICD-10-CM | POA: Insufficient documentation

## 2013-03-30 DIAGNOSIS — Z79899 Other long term (current) drug therapy: Secondary | ICD-10-CM

## 2013-03-30 DIAGNOSIS — I5032 Chronic diastolic (congestive) heart failure: Secondary | ICD-10-CM | POA: Insufficient documentation

## 2013-03-30 DIAGNOSIS — I120 Hypertensive chronic kidney disease with stage 5 chronic kidney disease or end stage renal disease: Secondary | ICD-10-CM

## 2013-03-30 DIAGNOSIS — Z992 Dependence on renal dialysis: Secondary | ICD-10-CM

## 2013-03-30 DIAGNOSIS — Z791 Long term (current) use of non-steroidal anti-inflammatories (NSAID): Secondary | ICD-10-CM | POA: Insufficient documentation

## 2013-03-30 DIAGNOSIS — F17200 Nicotine dependence, unspecified, uncomplicated: Secondary | ICD-10-CM

## 2013-03-30 DIAGNOSIS — S98119A Complete traumatic amputation of unspecified great toe, initial encounter: Secondary | ICD-10-CM

## 2013-03-30 DIAGNOSIS — M129 Arthropathy, unspecified: Secondary | ICD-10-CM

## 2013-03-30 DIAGNOSIS — I509 Heart failure, unspecified: Secondary | ICD-10-CM | POA: Insufficient documentation

## 2013-03-30 DIAGNOSIS — Z8711 Personal history of peptic ulcer disease: Secondary | ICD-10-CM

## 2013-03-30 DIAGNOSIS — D649 Anemia, unspecified: Secondary | ICD-10-CM | POA: Insufficient documentation

## 2013-03-30 DIAGNOSIS — N186 End stage renal disease: Secondary | ICD-10-CM | POA: Insufficient documentation

## 2013-03-30 DIAGNOSIS — J441 Chronic obstructive pulmonary disease with (acute) exacerbation: Secondary | ICD-10-CM | POA: Insufficient documentation

## 2013-03-30 DIAGNOSIS — Z8669 Personal history of other diseases of the nervous system and sense organs: Secondary | ICD-10-CM | POA: Insufficient documentation

## 2013-03-30 DIAGNOSIS — J3489 Other specified disorders of nose and nasal sinuses: Secondary | ICD-10-CM

## 2013-03-30 DIAGNOSIS — J45901 Unspecified asthma with (acute) exacerbation: Secondary | ICD-10-CM

## 2013-03-30 LAB — POTASSIUM: Potassium: 3.4 mEq/L — ABNORMAL LOW (ref 3.7–5.3)

## 2013-03-30 MED ORDER — ALBUTEROL SULFATE (2.5 MG/3ML) 0.083% IN NEBU
5.0000 mg | INHALATION_SOLUTION | Freq: Once | RESPIRATORY_TRACT | Status: AC
  Administered 2013-03-30: 5 mg via RESPIRATORY_TRACT
  Filled 2013-03-30: qty 6

## 2013-03-30 MED ORDER — PREDNISONE 50 MG PO TABS
60.0000 mg | ORAL_TABLET | Freq: Once | ORAL | Status: AC
  Administered 2013-03-30: 12:00:00 60 mg via ORAL
  Filled 2013-03-30 (×2): qty 1

## 2013-03-30 MED ORDER — IPRATROPIUM BROMIDE 0.02 % IN SOLN
0.5000 mg | Freq: Once | RESPIRATORY_TRACT | Status: AC
  Administered 2013-03-30: 0.5 mg via RESPIRATORY_TRACT
  Filled 2013-03-30: qty 2.5

## 2013-03-30 NOTE — ED Notes (Signed)
[email or web address] paged to [phone]

## 2013-03-30 NOTE — ED Notes (Signed)
Discharge instructions reviewed with pt, questions answered. Pt verbalized understanding.  

## 2013-03-30 NOTE — ED Notes (Addendum)
Pt is a dialysis pt, scheduled for dialysis today, did not receive dialysis today, pt has had increased SOB. Old fistula on rt, restricted extremity on lt.

## 2013-03-30 NOTE — ED Notes (Signed)
Pt sob since Sunday, h/o chf, +cough but unable to "get it up", no fever or nausea.  Missed dialysis today (m/w/f) due to sob. Denies any swelling or cp.

## 2013-03-30 NOTE — ED Notes (Signed)
Pt is 100% on 4L [place] O2

## 2013-03-30 NOTE — Discharge Instructions (Signed)
Go to dialysis now and then return tomorrow.

## 2013-03-30 NOTE — ED Provider Notes (Signed)
CSN: [id number]     Arrival date & time 03/30/13  1023 History   First MD Initiated Contact with Patient 03/30/13 1047 This chart was scribed for Maudry Diego, MD by Anastasia Pall, ED Scribe. This patient was seen in room APA02/APA02 and the patient's care was started at 10:49 AM.      Chief Complaint  Patient presents with  . Shortness of Breath    Patient is a 63 y.o. male presenting with shortness of breath. The history is provided by the patient. No language interpreter was used.  Shortness of Breath Onset quality:  Gradual Duration:  4 days Progression:  Worsening Associated symptoms: cough (unproductive)   Associated symptoms: no abdominal pain, no chest pain, no fever, no headaches and no rash   Cough:    Cough characteristics:  Non-productive   Duration:  4 days Risk factors comment:  H/o CHF, dialysis  HPI Comments: [name] is a 63 y.o. male brought from group home, with h/o CHF and dialysis, who presents to the Emergency Department complaining of gradually worsening SOB, with associated congestion and unproductive cough, onset 4 days ago. He reports he has been sitting down to try to relieve his SOB. He reports being on O2 2 L at home. He is on dialysis at [facility], but did not receive treatment today due to him being too SOB. He reports h/o smoking, 4-5 cigarettes per day. He denies, fever, chest pain, LE swelling, and any other associated symptoms.   PCP - Redge Gainer, MD   Past Medical History  Diagnosis Date  . Hyperlipidemia   . Arthritis   . Claudication   . Anemia   . Chronic diastolic CHF (congestive heart failure) 07/2010    a. 07/2008 Echo EF 50-55%, mild-mod LVH, Gr 1 DD.  Marland Kitchen Peripheral vascular occlusive disease     a. 07/2011 Periph Angio: No signif Ao-illiac dzs, LCF stenosis, 100% LSFA w recon above knee pop and 3 vessel runoff, 100% RSFA w/ recon above knee --> pending L Fem to below Knee pop bypass.  Marland Kitchen History of tobacco abuse   .  Hypertension     takes Amlodipine,Metoprolol,and Prinivil daily  . Dyspnea on exertion     with exertion  . Cataract     bilateral  . Dialysis patient   . PUD (peptic ulcer disease) mid 1990's    with GI bleed.  endoscoped in mid 1990's at [facility]  . Stroke 2005    no residual  . Asthma     08/2012  . GERD (gastroesophageal reflux disease)   . History of blood transfusion     peptic ulcer  . ESRD (end stage renal disease)     a. MWF dialysis in [place] (followed by Dr. Lowanda Foster)   Past Surgical History  Procedure Laterality Date  . Arteriovenous graft placement    . Av fistula placement  12/10/2000    Right brachiocephalic arteriovenous   . Aortagram  07/29/2011    Abdominal Aortagram  . Cardiac catheterization  08/01/11    Left heart catheterization  . Femoral-popliteal bypass graft  09/09/2011    Procedure: BYPASS GRAFT FEMORAL-POPLITEAL ARTERY;  Surgeon: Serafina Mitchell, MD;  Location: [place];  Service: Vascular;  Laterality: Left;  . Amputation  09/12/2011    Procedure: AMPUTATION BELOW KNEE;  Surgeon: Serafina Mitchell, MD;  Location: [facility] OR;  Service: Vascular;  Laterality: Left;  TRANSMETATARSAL  . Application of wound vac  10/30/2011  Procedure: APPLICATION OF WOUND VAC;  Surgeon: Serafina Mitchell, MD;  Location: [place];  Service: Vascular;  Laterality: Left;  . Groin debridement  11/01/2011    Procedure: Virl Son DEBRIDEMENT;  Surgeon: Angelia Mould, MD;  Location: [facility] OR;  Service: Vascular;  Laterality: Left;  . Esophagogastroduodenoscopy  11/06/2011    Procedure: ESOPHAGOGASTRODUODENOSCOPY (EGD);  Surgeon: Jerene Bears, MD;  Location: [place];  Service: Gastroenterology;  Laterality: N/A;  . Femoral-popliteal bypass graft  01/02/2012    Procedure: BYPASS GRAFT FEMORAL-POPLITEAL ARTERY;  Surgeon: Serafina Mitchell, MD;  Location: [place];  Service: Vascular;  Laterality: Right;  . Amputation  01/02/2012    Procedure: AMPUTATION RAY;  Surgeon: Serafina Mitchell, MD;   Location: [facility] OR;  Service: Vascular;  Laterality: Right;  . Toe amputation Right     5/13- great toe  . Exchange of a dialysis catheter N/A 07/15/2012    Procedure: EXCHANGE OF A DIALYSIS CATHETER;  Surgeon: Conrad Carrollton, MD;  Location: [place];  Service: Vascular;  Laterality: N/A;  . Av fistula placement Left 07/15/2012    Procedure: ARTERIOVENOUS (AV) FISTULA CREATION;  Surgeon: Conrad Cary, MD;  Location: [place];  Service: Vascular;  Laterality: Left;  . Av fistula placement Left 09/07/2012    Procedure: INSERTION OF ARTERIOVENOUS (AV) GORE-TEX GRAFT ARM;  Surgeon: Conrad San Geronimo, MD;  Location: [place];  Service: Vascular;  Laterality: Left;  . Insertion of dialysis catheter Right 09/07/2012    Procedure: INSERTION OF a Femoral DIALYSIS CATHETER;  Surgeon: Conrad Stella, MD;  Location: [place];  Service: Vascular;  Laterality: Right;  . Lipoma excision Left 09/07/2012    Procedure: EXCISION Seroma left arm;  Surgeon: Conrad , MD;  Location: [place];  Service: Vascular;  Laterality: Left;  . Hernia repair      umbilical  . Thrombectomy and revision of arterioventous (av) goretex  graft Left 11/30/2012    Procedure: THROMBECTOMY AND REVISION OF ARTERIOVENTOUS (AV) GORETEX  GRAFT;  Surgeon: Angelia Mould, MD;  Location: [facility] OR;  Service: Vascular;  Laterality: Left;   Family History  Problem Relation Age of Onset  . Breast cancer Mother   . Cancer Mother   . Cancer Father   . Anesthesia problems Neg Hx   . Hypotension Neg Hx   . Malignant hyperthermia Neg Hx   . Pseudochol deficiency Neg Hx    History  Substance Use Topics  . Smoking status: Current Some Day Smoker -- 0.10 packs/day for 40 years    Types: Cigarettes    Last Attempt to Quit: 01/22/2013  . Smokeless tobacco: Never Used     Comment: quit, but still "sneaks a smoke" occasionally  . Alcohol Use: No    Review of Systems  Constitutional: Negative for fever, appetite change and fatigue.  HENT: Negative for congestion, ear  discharge and sinus pressure.   Eyes: Negative for discharge.  Respiratory: Positive for cough (unproductive) and shortness of breath.   Cardiovascular: Negative for chest pain and leg swelling.  Gastrointestinal: Negative for nausea, abdominal pain and diarrhea.  Genitourinary: Negative for frequency and hematuria.  Musculoskeletal: Negative for back pain.  Skin: Negative for rash.  Neurological: Negative for seizures and headaches.  Psychiatric/Behavioral: Negative for hallucinations.    Allergies  Ambien  Home Medications   Current Outpatient Rx  Name  Route  Sig  Dispense  Refill  . ALPRAZolam (XANAX) 1 MG tablet   Oral   Take  1 tablet (1 mg total) by mouth 2 (two) times daily as needed for anxiety or sleep.   60 tablet   2   . calcium acetate (PHOSLO) 667 MG capsule   Oral   Take 1,334 mg by mouth 2 (two) times daily.          . cinacalcet (SENSIPAR) 60 MG tablet   Oral   Take 60 mg by mouth daily.         . diclofenac (VOLTAREN) 50 MG EC tablet   Oral   Take 50 mg by mouth 2 (two) times daily.         . DULoxetine (CYMBALTA) 60 MG capsule   Oral   Take 60 mg by mouth daily.         . folic acid (FOLVITE) 1 MG tablet   Oral   Take 1 mg by mouth daily.         Marland Kitchen gabapentin (NEURONTIN) 300 MG capsule   Oral   Take 1 capsule (300 mg total) by mouth 3 (three) times daily.   90 capsule   11   . multivitamin (RENA-VIT) TABS tablet   Oral   Take 1 tablet by mouth daily.         Marland Kitchen omeprazole (PRILOSEC) 20 MG capsule   Oral   Take 20 mg by mouth daily.         Marland Kitchen oxyCODONE-acetaminophen (PERCOCET) 7.5-325 MG per tablet   Oral   Take 1 tablet by mouth every 8 (eight) hours as needed for pain.   20 tablet   0   . PROAIR HFA 108 (90 BASE) MCG/ACT inhaler   Inhalation   Inhale 1 puff into the lungs every 4 (four) hours as needed for wheezing.          Marland Kitchen tiZANidine (ZANAFLEX) 4 MG tablet   Oral   Take 4 mg by mouth every 6 (six) hours as  needed for muscle spasms.           BP 116/34  Pulse 61  Temp(Src) 97.7 F (36.5 C) (Oral)  Resp 32  Ht 5\' 6"  (1.676 m)  Wt 144 lb (65.318 kg)  BMI 23.25 kg/m2  SpO2 81%  Physical Exam  Constitutional: He is oriented to person, place, and time. He appears well-developed.  HENT:  Head: Normocephalic.  Eyes: Conjunctivae and EOM are normal. No scleral icterus.  Neck: Neck supple. No thyromegaly present.  Cardiovascular: Normal rate and regular rhythm.  Exam reveals no gallop and no friction rub.   No murmur heard. Pulmonary/Chest: No stridor. He has wheezes (moderate wheezing bilaterally). He has no rales. He exhibits no tenderness.  Abdominal: He exhibits no distension. There is no tenderness. There is no rebound.  Musculoskeletal: Normal range of motion. He exhibits no edema.  Amputated right toes.  Lymphadenopathy:    He has no cervical adenopathy.  Neurological: He is oriented to person, place, and time. He exhibits normal muscle tone. Coordination normal.  Skin: No rash noted. No erythema.  Dialysis graft over left arm.  Psychiatric: He has a normal mood and affect. His behavior is normal.    ED Course  Procedures (including critical care time) DIAGNOSTIC STUDIES: Oxygen Saturation is 81% on room air, low by my interpretation.    COORDINATION OF CARE: 10:54 AM-Discussed treatment plan which includes CXR, EKG, IV fluids, and breathing treatment with pt at bedside and pt agreed to plan.    Labs Review Labs Reviewed  POTASSIUM  Imaging Review Dg Chest Portable 1 View  03/30/2013   CLINICAL DATA:  Shortness of breath. Hypertension. Chronic renal failure on dialysis.  EXAM: PORTABLE CHEST - 1 VIEW  COMPARISON:  02/11/2013  FINDINGS: Moderate bilateral pleural effusions show no significant change. Bibasilar atelectasis is stable.  There is increased diffuse interstitial infiltrates consistent with interstitial edema. Cardiomegaly is stable.  IMPRESSION: Increased  diffuse interstitial infiltrates, consistent with interstitial edema.  Stable cardiomegaly, moderate bilateral pleural effusions, and bibasilar atelectasis.   Electronically Signed   By: Earle Gell M.D.   On: 03/30/2013 11:13    EKG Interpretation    Date/Time:  Wednesday March 30 2013 10:40:27 EST Ventricular Rate:  69 PR Interval:  168 QRS Duration: 110 QT Interval:  426 QTC Calculation: 456 R Axis:   9 Text Interpretation:  Normal sinus rhythm ST \\T \ T wave abnormality, consider lateral ischemia Abnormal ECG When compared with ECG of 08-Feb-2013 14:01, PREVIOUS ECG IS PRESENT Confirmed by Anjelita Sheahan  MD, Jatara Huettner ([id number]) on 03/30/2013 12:55:05 PM            MDM  Chf,  I spoke to renal and Dr. Kathee Polite stated to send pt to dialysis now and have him go back again tomorrow The chart was scribed for me under my direct supervision.  I personally performed the history, physical, and medical decision making and all procedures in the evaluation of this patient.Maudry Diego, MD 03/30/13 1256

## 2013-03-30 NOTE — ED Notes (Signed)
Pt O2 backed down to 2L [place], pt states he is on 2L [place] at home.

## 2013-04-01 ENCOUNTER — Emergency Department (HOSPITAL_COMMUNITY): Payer: Medicare Other

## 2013-04-01 ENCOUNTER — Encounter (HOSPITAL_COMMUNITY): Payer: Self-pay | Admitting: Emergency Medicine

## 2013-04-01 ENCOUNTER — Inpatient Hospital Stay (HOSPITAL_COMMUNITY)
Admission: EM | Admit: 2013-04-01 | Discharge: 2013-04-07 | DRG: 252 | Disposition: A | Discharge status: Home / Self Care | Payer: Medicare Other | Attending: Internal Medicine | Admitting: Internal Medicine

## 2013-04-01 DIAGNOSIS — D631 Anemia in chronic kidney disease: Secondary | ICD-10-CM | POA: Diagnosis present

## 2013-04-01 DIAGNOSIS — S88119A Complete traumatic amputation at level between knee and ankle, unspecified lower leg, initial encounter: Secondary | ICD-10-CM

## 2013-04-01 DIAGNOSIS — R590 Localized enlarged lymph nodes: Secondary | ICD-10-CM

## 2013-04-01 DIAGNOSIS — R112 Nausea with vomiting, unspecified: Secondary | ICD-10-CM | POA: Diagnosis present

## 2013-04-01 DIAGNOSIS — J9601 Acute respiratory failure with hypoxia: Secondary | ICD-10-CM

## 2013-04-01 DIAGNOSIS — I5031 Acute diastolic (congestive) heart failure: Secondary | ICD-10-CM

## 2013-04-01 DIAGNOSIS — R599 Enlarged lymph nodes, unspecified: Secondary | ICD-10-CM | POA: Diagnosis present

## 2013-04-01 DIAGNOSIS — I739 Peripheral vascular disease, unspecified: Secondary | ICD-10-CM | POA: Diagnosis present

## 2013-04-01 DIAGNOSIS — Z8673 Personal history of transient ischemic attack (TIA), and cerebral infarction without residual deficits: Secondary | ICD-10-CM

## 2013-04-01 DIAGNOSIS — Z992 Dependence on renal dialysis: Secondary | ICD-10-CM

## 2013-04-01 DIAGNOSIS — J811 Chronic pulmonary edema: Secondary | ICD-10-CM

## 2013-04-01 DIAGNOSIS — J90 Pleural effusion, not elsewhere classified: Secondary | ICD-10-CM

## 2013-04-01 DIAGNOSIS — I5033 Acute on chronic diastolic (congestive) heart failure: Secondary | ICD-10-CM

## 2013-04-01 DIAGNOSIS — I5043 Acute on chronic combined systolic (congestive) and diastolic (congestive) heart failure: Principal | ICD-10-CM | POA: Diagnosis present

## 2013-04-01 DIAGNOSIS — I5032 Chronic diastolic (congestive) heart failure: Secondary | ICD-10-CM

## 2013-04-01 DIAGNOSIS — N186 End stage renal disease: Secondary | ICD-10-CM | POA: Diagnosis present

## 2013-04-01 DIAGNOSIS — Z888 Allergy status to other drugs, medicaments and biological substances status: Secondary | ICD-10-CM

## 2013-04-01 DIAGNOSIS — T82598A Other mechanical complication of other cardiac and vascular devices and implants, initial encounter: Secondary | ICD-10-CM | POA: Diagnosis not present

## 2013-04-01 DIAGNOSIS — I509 Heart failure, unspecified: Secondary | ICD-10-CM | POA: Diagnosis present

## 2013-04-01 DIAGNOSIS — I959 Hypotension, unspecified: Secondary | ICD-10-CM | POA: Diagnosis present

## 2013-04-01 DIAGNOSIS — E876 Hypokalemia: Secondary | ICD-10-CM | POA: Diagnosis present

## 2013-04-01 DIAGNOSIS — J9600 Acute respiratory failure, unspecified whether with hypoxia or hypercapnia: Secondary | ICD-10-CM

## 2013-04-01 DIAGNOSIS — Z9981 Dependence on supplemental oxygen: Secondary | ICD-10-CM

## 2013-04-01 DIAGNOSIS — J441 Chronic obstructive pulmonary disease with (acute) exacerbation: Secondary | ICD-10-CM | POA: Diagnosis present

## 2013-04-01 DIAGNOSIS — J45901 Unspecified asthma with (acute) exacerbation: Secondary | ICD-10-CM

## 2013-04-01 DIAGNOSIS — Y832 Surgical operation with anastomosis, bypass or graft as the cause of abnormal reaction of the patient, or of later complication, without mention of misadventure at the time of the procedure: Secondary | ICD-10-CM | POA: Diagnosis not present

## 2013-04-01 DIAGNOSIS — J189 Pneumonia, unspecified organism: Secondary | ICD-10-CM | POA: Diagnosis present

## 2013-04-01 DIAGNOSIS — E785 Hyperlipidemia, unspecified: Secondary | ICD-10-CM | POA: Diagnosis present

## 2013-04-01 DIAGNOSIS — I120 Hypertensive chronic kidney disease with stage 5 chronic kidney disease or end stage renal disease: Secondary | ICD-10-CM | POA: Diagnosis present

## 2013-04-01 DIAGNOSIS — J449 Chronic obstructive pulmonary disease, unspecified: Secondary | ICD-10-CM

## 2013-04-01 DIAGNOSIS — J9620 Acute and chronic respiratory failure, unspecified whether with hypoxia or hypercapnia: Secondary | ICD-10-CM | POA: Diagnosis present

## 2013-04-01 DIAGNOSIS — F17200 Nicotine dependence, unspecified, uncomplicated: Secondary | ICD-10-CM | POA: Diagnosis present

## 2013-04-01 DIAGNOSIS — N039 Chronic nephritic syndrome with unspecified morphologic changes: Secondary | ICD-10-CM

## 2013-04-01 DIAGNOSIS — K219 Gastro-esophageal reflux disease without esophagitis: Secondary | ICD-10-CM | POA: Diagnosis present

## 2013-04-01 HISTORY — DX: Chronic obstructive pulmonary disease, unspecified: J44.9

## 2013-04-01 LAB — POCT I-STAT, CHEM 8
BUN: 21 mg/dL (ref 6–23)
Calcium, Ion: 1.16 mmol/L (ref 1.13–1.30)
Chloride: 101 mEq/L (ref 96–112)
Creatinine, Ser: 4.8 mg/dL — ABNORMAL HIGH (ref 0.50–1.35)
Glucose, Bld: 92 mg/dL (ref 70–99)
HEMATOCRIT: 39 % (ref 39.0–52.0)
HEMOGLOBIN: 13.3 g/dL (ref 13.0–17.0)
POTASSIUM: 3.2 meq/L — AB (ref 3.7–5.3)
Sodium: 142 mEq/L (ref 137–147)
TCO2: 31 mmol/L (ref 0–100)

## 2013-04-01 LAB — TROPONIN I

## 2013-04-01 LAB — CBC WITH DIFFERENTIAL/PLATELET
BASOS ABS: 0.1 10*3/uL (ref 0.0–0.1)
Basophils Relative: 1 % (ref 0–1)
EOS ABS: 0.1 10*3/uL (ref 0.0–0.7)
Eosinophils Relative: 2 % (ref 0–5)
HCT: 36 % — ABNORMAL LOW (ref 39.0–52.0)
Hemoglobin: 10.9 g/dL — ABNORMAL LOW (ref 13.0–17.0)
Lymphocytes Relative: 13 % (ref 12–46)
Lymphs Abs: 0.8 10*3/uL (ref 0.7–4.0)
MCH: 26.2 pg (ref 26.0–34.0)
MCHC: 30.3 g/dL (ref 30.0–36.0)
MCV: 86.5 fL (ref 78.0–100.0)
MONOS PCT: 7 % (ref 3–12)
Monocytes Absolute: 0.4 10*3/uL (ref 0.1–1.0)
Neutro Abs: 4.6 10*3/uL (ref 1.7–7.7)
Neutrophils Relative %: 77 % (ref 43–77)
Platelets: 137 10*3/uL — ABNORMAL LOW (ref 150–400)
RBC: 4.16 MIL/uL — ABNORMAL LOW (ref 4.22–5.81)
RDW: 21.5 % — AB (ref 11.5–15.5)
WBC: 6 10*3/uL (ref 4.0–10.5)

## 2013-04-01 LAB — PHOSPHORUS: PHOSPHORUS: 3.8 mg/dL (ref 2.3–4.6)

## 2013-04-01 LAB — POCT I-STAT TROPONIN I: Troponin i, poc: 0.07 ng/mL (ref 0.00–0.08)

## 2013-04-01 LAB — PRO B NATRIURETIC PEPTIDE: PRO B NATRI PEPTIDE: 48968 pg/mL — AB (ref 0–125)

## 2013-04-01 LAB — GLUCOSE, CAPILLARY: GLUCOSE-CAPILLARY: 73 mg/dL (ref 70–99)

## 2013-04-01 LAB — MRSA PCR SCREENING: MRSA BY PCR: NEGATIVE

## 2013-04-01 MED ORDER — OXYCODONE-ACETAMINOPHEN 5-325 MG PO TABS
1.0000 | ORAL_TABLET | Freq: Three times a day (TID) | ORAL | Status: DC | PRN
Start: 2013-04-01 — End: 2013-04-07
  Administered 2013-04-06 (×2): 1 via ORAL
  Filled 2013-04-01: qty 1

## 2013-04-01 MED ORDER — METHYLPREDNISOLONE SODIUM SUCC 125 MG IJ SOLR
125.0000 mg | Freq: Once | INTRAMUSCULAR | Status: AC
  Administered 2013-04-01: 125 mg via INTRAVENOUS
  Filled 2013-04-01: qty 2

## 2013-04-01 MED ORDER — VANCOMYCIN HCL 10 G IV SOLR
1250.0000 mg | Freq: Once | INTRAVENOUS | Status: AC
  Administered 2013-04-01: 1250 mg via INTRAVENOUS
  Filled 2013-04-01: qty 1250

## 2013-04-01 MED ORDER — RENA-VITE PO TABS
1.0000 | ORAL_TABLET | Freq: Every day | ORAL | Status: DC
  Administered 2013-04-01 – 2013-04-02 (×2): 1 via ORAL
  Administered 2013-04-03: 22:00:00 via ORAL
  Administered 2013-04-05 – 2013-04-06 (×3): 1 via ORAL
  Filled 2013-04-01 (×7): qty 1

## 2013-04-01 MED ORDER — DEXTROSE 5 % IV SOLN
2.0000 g | INTRAVENOUS | Status: DC
  Filled 2013-04-01: qty 2

## 2013-04-01 MED ORDER — OXYCODONE HCL 5 MG PO TABS
2.5000 mg | ORAL_TABLET | Freq: Three times a day (TID) | ORAL | Status: DC | PRN
  Administered 2013-04-05 (×2): 2.5 mg via ORAL
  Filled 2013-04-01 (×2): qty 1

## 2013-04-01 MED ORDER — TIZANIDINE HCL 4 MG PO TABS
4.0000 mg | ORAL_TABLET | Freq: Four times a day (QID) | ORAL | Status: DC | PRN
  Filled 2013-04-01: qty 1

## 2013-04-01 MED ORDER — ALPRAZOLAM 0.5 MG PO TABS
1.0000 mg | ORAL_TABLET | Freq: Two times a day (BID) | ORAL | Status: DC | PRN
  Administered 2013-04-01 – 2013-04-06 (×5): 1 mg via ORAL
  Filled 2013-04-01 (×6): qty 2

## 2013-04-01 MED ORDER — SODIUM CHLORIDE 0.9 % IJ SOLN
3.0000 mL | Freq: Two times a day (BID) | INTRAMUSCULAR | Status: DC
  Administered 2013-04-02 – 2013-04-05 (×6): 3 mL via INTRAVENOUS
  Administered 2013-04-05: 6 mL via INTRAVENOUS
  Administered 2013-04-05 – 2013-04-07 (×3): 3 mL via INTRAVENOUS

## 2013-04-01 MED ORDER — ONDANSETRON HCL 4 MG/2ML IJ SOLN
4.0000 mg | Freq: Four times a day (QID) | INTRAMUSCULAR | Status: DC | PRN
Start: 2013-04-01 — End: 2013-04-07
  Administered 2013-04-03 – 2013-04-05 (×3): 4 mg via INTRAVENOUS
  Filled 2013-04-01 (×3): qty 2

## 2013-04-01 MED ORDER — ACETAMINOPHEN 650 MG RE SUPP: 650.0000 mg | Freq: Four times a day (QID) | RECTAL | Status: DC | PRN

## 2013-04-01 MED ORDER — METHYLPREDNISOLONE SODIUM SUCC 125 MG IJ SOLR: 40.0000 mg | Freq: Two times a day (BID) | INTRAMUSCULAR | Status: DC

## 2013-04-01 MED ORDER — GABAPENTIN 300 MG PO CAPS
300.0000 mg | ORAL_CAPSULE | Freq: Three times a day (TID) | ORAL | Status: DC
  Administered 2013-04-01 – 2013-04-06 (×14): 300 mg via ORAL
  Filled 2013-04-01 (×16): qty 1

## 2013-04-01 MED ORDER — DOXERCALCIFEROL 4 MCG/2ML IV SOLN
INTRAVENOUS | Status: AC
  Filled 2013-04-01: qty 2

## 2013-04-01 MED ORDER — GUAIFENESIN ER 600 MG PO TB12
600.0000 mg | ORAL_TABLET | Freq: Two times a day (BID) | ORAL | Status: DC
  Administered 2013-04-01 – 2013-04-07 (×11): 600 mg via ORAL
  Filled 2013-04-01 (×14): qty 1

## 2013-04-01 MED ORDER — ACETAMINOPHEN 325 MG PO TABS: 650.0000 mg | ORAL_TABLET | Freq: Four times a day (QID) | ORAL | Status: DC | PRN

## 2013-04-01 MED ORDER — ALBUTEROL (5 MG/ML) CONTINUOUS INHALATION SOLN
10.0000 mg/h | INHALATION_SOLUTION | Freq: Once | RESPIRATORY_TRACT | Status: AC
Start: 2013-04-01 — End: 2013-04-01
  Administered 2013-04-01: 10 mg/h via RESPIRATORY_TRACT
  Filled 2013-04-01: qty 20

## 2013-04-01 MED ORDER — CINACALCET HCL 30 MG PO TABS
30.0000 mg | ORAL_TABLET | Freq: Every day | ORAL | Status: DC
  Administered 2013-04-02 – 2013-04-07 (×6): 30 mg via ORAL
  Filled 2013-04-01 (×7): qty 1

## 2013-04-01 MED ORDER — ONDANSETRON HCL 4 MG PO TABS: 4.0000 mg | ORAL_TABLET | Freq: Four times a day (QID) | ORAL | Status: DC | PRN

## 2013-04-01 MED ORDER — NEPRO/CARBSTEADY PO LIQD
237.0000 mL | Freq: Two times a day (BID) | ORAL | Status: DC
  Administered 2013-04-01 – 2013-04-07 (×9): 237 mL via ORAL
  Filled 2013-04-01 (×15): qty 237

## 2013-04-01 MED ORDER — CALCIUM ACETATE 667 MG PO CAPS
2001.0000 mg | ORAL_CAPSULE | Freq: Three times a day (TID) | ORAL | Status: DC
  Administered 2013-04-01 – 2013-04-07 (×14): 2001 mg via ORAL
  Filled 2013-04-01 (×20): qty 3

## 2013-04-01 MED ORDER — SODIUM CHLORIDE 0.9 % IV SOLN
250.0000 mL | INTRAVENOUS | Status: DC | PRN
  Administered 2013-04-01: 250 mL via INTRAVENOUS

## 2013-04-01 MED ORDER — PANTOPRAZOLE SODIUM 40 MG PO TBEC
40.0000 mg | DELAYED_RELEASE_TABLET | Freq: Every day | ORAL | Status: DC
  Administered 2013-04-01 – 2013-04-07 (×7): 40 mg via ORAL
  Filled 2013-04-01 (×7): qty 1

## 2013-04-01 MED ORDER — CEFEPIME HCL 2 G IJ SOLR
2.0000 g | INTRAMUSCULAR | Status: DC
  Administered 2013-04-01: 2 g via INTRAVENOUS
  Filled 2013-04-01 (×2): qty 2

## 2013-04-01 MED ORDER — ENOXAPARIN SODIUM 30 MG/0.3ML ~~LOC~~ SOLN
30.0000 mg | SUBCUTANEOUS | Status: DC
  Administered 2013-04-01 – 2013-04-05 (×5): 30 mg via SUBCUTANEOUS
  Filled 2013-04-01 (×7): qty 0.3

## 2013-04-01 MED ORDER — OXYCODONE-ACETAMINOPHEN 7.5-325 MG PO TABS: 1.0000 | ORAL_TABLET | Freq: Three times a day (TID) | ORAL | Status: DC | PRN

## 2013-04-01 MED ORDER — DOXERCALCIFEROL 4 MCG/2ML IV SOLN
2.5000 ug | INTRAVENOUS | Status: DC
  Administered 2013-04-04 – 2013-04-06 (×2): 2.5 ug via INTRAVENOUS
  Filled 2013-04-01 (×2): qty 2

## 2013-04-01 MED ORDER — LEVALBUTEROL HCL 0.63 MG/3ML IN NEBU
0.6300 mg | INHALATION_SOLUTION | Freq: Three times a day (TID) | RESPIRATORY_TRACT | Status: AC
  Administered 2013-04-01 – 2013-04-02 (×2): 0.63 mg via RESPIRATORY_TRACT
  Filled 2013-04-01 (×3): qty 3

## 2013-04-01 MED ORDER — METHYLPREDNISOLONE SODIUM SUCC 40 MG IJ SOLR
40.0000 mg | Freq: Two times a day (BID) | INTRAMUSCULAR | Status: DC
  Administered 2013-04-01 – 2013-04-03 (×4): 40 mg via INTRAVENOUS
  Filled 2013-04-01 (×7): qty 1

## 2013-04-01 MED ORDER — IPRATROPIUM BROMIDE 0.02 % IN SOLN
0.5000 mg | Freq: Four times a day (QID) | RESPIRATORY_TRACT | Status: DC
  Administered 2013-04-01: 0.5 mg via RESPIRATORY_TRACT
  Filled 2013-04-01: qty 2.5

## 2013-04-01 MED ORDER — VANCOMYCIN HCL 500 MG IV SOLR: 500.0000 mg | INTRAVENOUS | Status: DC

## 2013-04-01 MED ORDER — FOLIC ACID 1 MG PO TABS
1.0000 mg | ORAL_TABLET | Freq: Every day | ORAL | Status: DC
  Administered 2013-04-01 – 2013-04-07 (×7): 1 mg via ORAL
  Filled 2013-04-01 (×7): qty 1

## 2013-04-01 MED ORDER — SODIUM CHLORIDE 0.9 % IJ SOLN: 3.0000 mL | INTRAMUSCULAR | Status: DC | PRN

## 2013-04-01 MED ORDER — DULOXETINE HCL 60 MG PO CPEP
60.0000 mg | ORAL_CAPSULE | Freq: Every day | ORAL | Status: DC
  Administered 2013-04-01 – 2013-04-07 (×7): 60 mg via ORAL
  Filled 2013-04-01 (×7): qty 1

## 2013-04-01 MED ORDER — LEVALBUTEROL HCL 0.63 MG/3ML IN NEBU: 0.6300 mg | INHALATION_SOLUTION | RESPIRATORY_TRACT | Status: DC | PRN

## 2013-04-01 MED ORDER — BIOTENE DRY MOUTH MT LIQD
15.0000 mL | Freq: Two times a day (BID) | OROMUCOSAL | Status: DC
  Administered 2013-04-01: 15 mL via OROMUCOSAL

## 2013-04-01 MED ORDER — IPRATROPIUM BROMIDE 0.02 % IN SOLN
0.5000 mg | Freq: Three times a day (TID) | RESPIRATORY_TRACT | Status: DC
  Administered 2013-04-02 – 2013-04-03 (×3): 0.5 mg via RESPIRATORY_TRACT
  Filled 2013-04-01 (×3): qty 2.5

## 2013-04-01 NOTE — ED Notes (Signed)
MD at bedside. 

## 2013-04-01 NOTE — ED Notes (Signed)
RN called back to say the bed is ready for the pt to come upstairs now.

## 2013-04-01 NOTE — ED Notes (Signed)
Per EMS - coming from home. D/c from [facility] on Tuesday. Pt is dialysis pt MWF, went Wednesday but didn't finish entire treatment, graft in left arm, needs to go today. wheezing in all lobes. Hx of COPD. Administered 3 duo nebs. Placed on CPAP, pt reports he is getting some relief now. Initial O2 sat 84%, was on 2 liters. BP 118/76 HR 77 irregular with PVCs.

## 2013-04-01 NOTE — H&P (Addendum)
[facility] History and Physical  [name] [id number] DOB: February 22, 1951 DOA: 04/01/2013  Referring physician: EDP PCP: Redge Gainer, MD   Chief Complaint: Cough and shortness of breath   HPI: [name] is a 63 y.o. male history of COPD-on O2 dependent, chronic diastolic CHF, end-stage renal disease on MWF and other medical problems as listed below who presents with above complaints. He states for the past 3 days he's had a cough productive of brownish sputum and overnight he developed worsening shortness of breath and came to the ED. He denies chest pain, fevers and no leg swelling. He was seen in the ED and chest x-ray showed persistent interstitial edema unchanged compared to prior exam (03/30/13), and persistent bilateral moderate pleural effusions with consolidation of both lungs.Per EDP he was hypoxic in the 70s initially, and wheezing on exam and treated with a continuous nebs, was placed on 5 L nasal cannula and O2 sats improved.  It was noted that today is his dialysis, renal was consulted and he is admitted to [facility] service for further evaluation and management.    Review of Systems The patient denies anorexia, fever, weight loss, vision loss, decreased hearing, hoarseness, chest pain, syncope, peripheral edema, balance deficits, hemoptysis, abdominal pain, melena, hematochezia, severe, hematuria, incontinence, genital sores, muscle weakness, suspicious skin lesions, transient blindness, difficulty walking, depression, unusual weight change, abnormal bleeding, enlarged lymph nodes, angioedema, and breast masses.   Past Medical History  Diagnosis Date  . Hyperlipidemia   . Arthritis   . Claudication   . Anemia   . Chronic diastolic CHF (congestive heart failure) 07/2010    a. 07/2008 Echo EF 50-55%, mild-mod LVH, Gr 1 DD.  Marland Kitchen Peripheral vascular occlusive disease     a. 07/2011 Periph Angio: No signif Ao-illiac dzs, LCF stenosis, 100% LSFA w recon above knee pop and 3  vessel runoff, 100% RSFA w/ recon above knee --> pending L Fem to below Knee pop bypass.  Marland Kitchen History of tobacco abuse   . Hypertension     takes Amlodipine,Metoprolol,and Prinivil daily  . Dyspnea on exertion     with exertion  . Cataract     bilateral  . Dialysis patient   . PUD (peptic ulcer disease) mid 1990's    with GI bleed.  endoscoped in mid 1990's at [facility]  . Stroke 2005    no residual  . Asthma     08/2012  . GERD (gastroesophageal reflux disease)   . History of blood transfusion     peptic ulcer  . ESRD (end stage renal disease)     a. MWF dialysis in [place] (followed by Dr. Lowanda Foster)  . COPD (chronic obstructive pulmonary disease) 04/01/2013   Past Surgical History  Procedure Laterality Date  . Arteriovenous graft placement    . Av fistula placement  12/10/2000    Right brachiocephalic arteriovenous   . Aortagram  07/29/2011    Abdominal Aortagram  . Cardiac catheterization  08/01/11    Left heart catheterization  . Femoral-popliteal bypass graft  09/09/2011    Procedure: BYPASS GRAFT FEMORAL-POPLITEAL ARTERY;  Surgeon: Serafina Mitchell, MD;  Location: [place];  Service: Vascular;  Laterality: Left;  . Amputation  09/12/2011    Procedure: AMPUTATION BELOW KNEE;  Surgeon: Serafina Mitchell, MD;  Location: [facility] OR;  Service: Vascular;  Laterality: Left;  TRANSMETATARSAL  . Application of wound vac  10/30/2011    Procedure: APPLICATION OF WOUND VAC;  Surgeon: Serafina Mitchell, MD;  Location: [facility] OR;  Service: Vascular;  Laterality: Left;  . Groin debridement  11/01/2011    Procedure: GROIN DEBRIDEMENT;  Surgeon: Chuck Hint, MD;  Location: [facility] OR;  Service: Vascular;  Laterality: Left;  . Esophagogastroduodenoscopy  11/06/2011    Procedure: ESOPHAGOGASTRODUODENOSCOPY (EGD);  Surgeon: Beverley Fiedler, MD;  Location: [facility] ENDOSCOPY;  Service: Gastroenterology;  Laterality: N/A;  . Femoral-popliteal bypass graft  01/02/2012    Procedure: BYPASS GRAFT FEMORAL-POPLITEAL ARTERY;   Surgeon: [name], MD;  Location: [facility] OR;  Service: Vascular;  Laterality: Right;  . Amputation  01/02/2012    Procedure: AMPUTATION RAY;  Surgeon: [name], MD;  Location: [facility] OR;  Service: Vascular;  Laterality: Right;  . Toe amputation Right     5/13- great toe  . Exchange of a dialysis catheter N/A 07/15/2012    Procedure: EXCHANGE OF A DIALYSIS CATHETER;  Surgeon: Fransisco Hertz, MD;  Location: [facility] OR;  Service: Vascular;  Laterality: N/A;  . Av fistula placement Left 07/15/2012    Procedure: ARTERIOVENOUS (AV) FISTULA CREATION;  Surgeon: Fransisco Hertz, MD;  Location: [facility] OR;  Service: Vascular;  Laterality: Left;  . Av fistula placement Left 09/07/2012    Procedure: INSERTION OF ARTERIOVENOUS (AV) GORE-TEX GRAFT ARM;  Surgeon: Fransisco Hertz, MD;  Location: [facility] OR;  Service: Vascular;  Laterality: Left;  . Insertion of dialysis catheter Right 09/07/2012    Procedure: INSERTION OF a Femoral DIALYSIS CATHETER;  Surgeon: Fransisco Hertz, MD;  Location: [facility] OR;  Service: Vascular;  Laterality: Right;  . Lipoma excision Left 09/07/2012    Procedure: EXCISION Seroma left arm;  Surgeon: Fransisco Hertz, MD;  Location: [facility] OR;  Service: Vascular;  Laterality: Left;  . Hernia repair      umbilical  . Thrombectomy and revision of arterioventous (av) goretex  graft Left 11/30/2012    Procedure: THROMBECTOMY AND REVISION OF ARTERIOVENTOUS (AV) GORETEX  GRAFT;  Surgeon: Chuck Hint, MD;  Location: [facility] OR;  Service: Vascular;  Laterality: Left;   Social History:  reports that he has been smoking Cigarettes.  He has a 20 pack-year smoking history. He has never used smokeless tobacco. He reports that he does not drink alcohol or use illicit drugs.  Allergies  Allergen Reactions  . Ambien [Zolpidem Tartrate] Other (See Comments)    Hallucinations    Family History  Problem Relation Age of Onset  . Breast cancer Mother   . Cancer Mother   . Cancer Father   . Anesthesia problems Neg Hx   .  Hypotension Neg Hx   . Malignant hyperthermia Neg Hx   . Pseudochol deficiency Neg Hx      Prior to Admission medications   Medication Sig Start Date End Date Taking? Authorizing Provider  ALPRAZolam Prudy Feeler) 1 MG tablet Take 1 tablet (1 mg total) by mouth 2 (two) times daily as needed for anxiety or sleep. 03/03/13  Yes Coralie Keens, FNP  calcium acetate (PHOSLO) 667 MG capsule Take 1,334 mg by mouth 3 (three) times daily with meals.    Yes Historical Provider, MD  DULoxetine (CYMBALTA) 60 MG capsule Take 60 mg by mouth daily.   Yes Historical Provider, MD  folic acid (FOLVITE) 1 MG tablet Take 1 mg by mouth daily.   Yes Historical Provider, MD  gabapentin (NEURONTIN) 300 MG capsule Take 1 capsule (300 mg total) by mouth 3 (three) times daily. 12/07/12  Yes Deatra Canter, FNP  multivitamin (RENA-VIT)  TABS tablet Take 1 tablet by mouth daily.   Yes Historical Provider, MD  omeprazole (PRILOSEC) 20 MG capsule Take 20 mg by mouth daily.   Yes Historical Provider, MD  oxyCODONE-acetaminophen (PERCOCET) 7.5-325 MG per tablet Take 1 tablet by mouth every 8 (eight) hours as needed for pain. 11/30/12  Yes Samantha J Rhyne, PA-C  PROAIR HFA 108 (90 BASE) MCG/ACT inhaler Inhale 1 puff into the lungs every 4 (four) hours as needed for wheezing.  10/22/12  Yes Historical Provider, MD  tiZANidine (ZANAFLEX) 4 MG tablet Take 4 mg by mouth every 6 (six) hours as needed for muscle spasms.  12/21/12  Yes Historical Provider, MD   Physical Exam: Filed Vitals:   04/01/13 1630  BP: 113/59  Pulse: 67  Temp:   Resp:     BP 113/59  Pulse 67  Temp(Src) 97.5 F (36.4 C) (Oral)  Resp 24  Ht 5\' 6"  (1.676 m)  Wt 61.9 kg (136 lb 7.4 oz)  BMI 22.04 kg/m2  SpO2 91% Constitutional: Vital signs reviewed.  Patient is a well-developed and well-nourished  in no acute distress and cooperative with exam. Alert and oriented x3.  Head: Normocephalic and atraumatic Mouth: no erythema or exudates, MMM Eyes: PERRL,  EOMI, conjunctivae normal, No scleral icterus.  Neck: Supple, Trachea midline normal ROM, No JVD, mass, thyromegaly, or carotid bruit present.  Cardiovascular: RRR, S1 normal, S2 normal, no MRG, pulses symmetric and intact bilaterally Pulmonary/Chest: Decreased air entry bilaterally, no wheezes. Respirations nonlabored Abdominal: Soft. Non-tender, non-distended, bowel sounds are normal, no masses, organomegaly, or guarding present.  GU: no CVA tenderness  extremities no cyanosis and no edema. 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  Skin: Warm, dry and intact. No rash.  Psychiatric: Normal mood and affect.               Labs on Admission:  Basic Metabolic Panel:  Recent Labs Lab 03/30/13 1153 04/01/13 0754 04/01/13 1313  NA  --  142  --   K 3.4* 3.2*  --   CL  --  101  --   GLUCOSE  --  92  --   BUN  --  21  --   CREATININE  --  4.80*  --   PHOS  --   --  3.8   Liver Function Tests: No results found for this basename: AST, ALT, ALKPHOS, BILITOT, PROT, ALBUMIN,  in the last 168 hours No results found for this basename: LIPASE, AMYLASE,  in the last 168 hours No results found for this basename: AMMONIA,  in the last 168 hours CBC:  Recent Labs Lab 04/01/13 0741 04/01/13 0754  WBC 6.0  --   NEUTROABS 4.6  --   HGB 10.9* 13.3  HCT 36.0* 39.0  MCV 86.5  --   PLT 137*  --    Cardiac Enzymes: No results found for this basename: CKTOTAL, CKMB, CKMBINDEX, TROPONINI,  in the last 168 hours  BNP (last 3 results)  Recent Labs  12/25/12 1300 02/08/13 1903  PROBNP 32295.0* 27711.0*   CBG:  Recent Labs Lab 04/01/13 1149  GLUCAP 73    Radiological Exams on Admission: Dg Chest Port 1 View  04/01/2013   CLINICAL DATA:  Shortness of breath and cough  EXAM:  PORTABLE CHEST - 1 VIEW  COMPARISON:  March 30, 2013  FINDINGS: The heart size and mediastinal contours are stable. The heart size is enlarged. There are bilateral pleural effusions unchanged. Consolidation of the bilateral mid and lung bases are unchanged. There is increased diffuse pulmonary interstitium bilaterally unchanged. The visualized skeletal structures are stable.  IMPRESSION: Persistent interstitial edema unchanged compared to prior exam. Persists in bilateral moderate pleural effusions with consolidation of both lungs unchanged.   Electronically Signed   By: Abelardo Diesel M.D.   On: 04/01/2013 07:44     Assessment/Plan Active Problems: Present on Admission:  . COPD with acute exacerbation -As discussed will continue nebulized bronchodilators, place on Solu-Medrol and mucolytics -Also place on empiric antibiotics to cover for possible HCAP as discussed below -Continue supplemental oxygen and follow. . Acute on chronic diastolic CHF +/-HCAP (healthcare-associated pneumonia) -Dialysis per renal for volume control -Will place on empiric antibiotics for HCAP for now follow recheck x-ray>> line adequate diuresis and reassess need continued antibiotics as clinically appropriate . Acute on chronic respiratory failure -Secondary to above C. treatment as discussed . End-stage renal disease on dialysis -Dialysis per renal . GERD   Continue PPI renal . h/o Hypertension -No outpatient medications listed on med rec, monitor BPs and treat as appropriate . History of CVA . Hypokalemia -Follow and recheck after dialysis, and further treat accordingly Code Status: FULL Family Communication: None at bedside Disposition Plan: Admit to step down  Time spent: >71mins  [facility] Pager [phone]

## 2013-04-01 NOTE — ED Notes (Signed)
Portable xray at bedside.

## 2013-04-01 NOTE — Care Management Note (Signed)
    Page 1 of 1   04/01/2013     12:54:18 PM   CARE MANAGEMENT NOTE 04/01/2013  Patient:  DERIK, FULTS   Account Number:  [id number]  Date Initiated:  04/01/2013  Documentation initiated by:  Elissa Hefty  Subjective/Objective Assessment:   adm w chf,resp distress     Action/Plan:   lives alone, has aide, pcp dr don Laurance Flatten   Anticipated DC Date:     Anticipated DC Plan:           Choice offered to / List presented to:             Status of service:   Medicare Important Message given?   (If response is "NO", the following Medicare IM given date fields will be blank) Date Medicare IM given:   Date Additional Medicare IM given:    Discharge Disposition:    Per UR Regulation:  Reviewed for med. necessity/level of care/duration of stay  If discussed at [place] of Stay Meetings, dates discussed:    Comments:

## 2013-04-01 NOTE — ED Notes (Addendum)
Pt taken off CPAP, speaking in full sentences, nad, resp e/u. RR 25. O2 sats 95% on 5 liters/[facility]

## 2013-04-01 NOTE — ED Notes (Addendum)
Pt reports he did a full treatment of dialysis yesterday. Started to feel sob this morning around 5am. Denies CP along with it. Denies increased swelling.

## 2013-04-01 NOTE — Progress Notes (Signed)
INITIAL NUTRITION ASSESSMENT  DOCUMENTATION CODES Per approved criteria  -Unable to assess   INTERVENTION: 1.  Supplements; Nepro Shake po BID, each supplement provides 425 kcal and 19 grams protein 2.  General healthful diet; encourage intake of foods and beverages as able.  RD to follow and assess for nutritional adequacy.   NUTRITION DIAGNOSIS: Unintended wt change related to increased metabolic demand as evidenced by pt on dialysis.   Monitor:  1.  Food/Beverage; pt meeting >/=90% estimated needs with tolerance. 2.  Wt/wt change; monitor trends  Reason for Assessment: MST  63 y.o. male  Admitting Dx: shortness of breath  ASSESSMENT: Pt admitted with shortness of breath, pulmonary edema.  Pt with h/o CKD on HD.  Pt s/p left BKA.  Pt unavailable at time of visit.  RD notes pt followed by clinical RD staff in previous admissions due to poor appetite and intake.  Pt with recent wt change from 72 kg to 62 kg that has occurred over the past 6 months (13.8% of his usual wt).  This is clinically significant.  Pt is at risk for malnutrition based on degree of recent wt loss.  RD to follow for ongoing assessment and appropriate interventions.  PO intake 75% of meal x1 since admission.   Height: Ht Readings from Last 1 Encounters:  04/01/13 5\' 6"  (1.676 m)    Weight: Wt Readings from Last 1 Encounters:  04/01/13 136 lb 7.4 oz (61.9 kg)    Ideal Body Weight: 135 lbs  % Ideal Body Weight: 101%  Wt Readings from Last 10 Encounters:  04/01/13 136 lb 7.4 oz (61.9 kg)  03/30/13 144 lb (65.318 kg)  03/08/13 138 lb 1.8 oz (62.647 kg)  02/22/13 146 lb (66.225 kg)  02/14/13 134 lb 4.2 oz (60.9 kg)  02/01/13 147 lb (66.679 kg)  01/20/13 144 lb (65.318 kg)  12/28/12 142 lb (64.411 kg)  12/25/12 141 lb 3.2 oz (64.048 kg)  12/11/12 143 lb (64.864 kg)    Usual Body Weight: 72 kg  % Usual Body Weight: 86%  Estimated Nutritional Needs: Kcal: 1900-2100 Protein: 80-90g Fluid:  per MD discretion  Skin: intact, non-pitting edema  Diet Order: Renal 80/90  EDUCATION NEEDS: -Education not appropriate at this time   Intake/Output Summary (Last 24 hours) at 04/01/13 1449 Last data filed at 04/01/13 1356  Gross per 24 hour  Intake    480 ml  Output      0 ml  Net    480 ml    Last BM: 1/8  Labs:   Recent Labs Lab 03/30/13 1153 04/01/13 0754  NA  --  142  K 3.4* 3.2*  CL  --  101  BUN  --  21  CREATININE  --  4.80*  GLUCOSE  --  92    CBG (last 3)   Recent Labs  04/01/13 1149  GLUCAP 73    Scheduled Meds: . antiseptic oral rinse  15 mL Mouth Rinse BID  . calcium acetate  2,001 mg Oral TID WC  . [START ON 04/02/2013] cinacalcet  30 mg Oral Q breakfast  . [START ON 04/04/2013] doxercalciferol  2.5 mcg Intravenous Q M,W,F-HD  . multivitamin  1 tablet Oral QHS    Continuous Infusions:   Past Medical History  Diagnosis Date  . Hyperlipidemia   . Arthritis   . Claudication   . Anemia   . Chronic diastolic CHF (congestive heart failure) 07/2010    a. 07/2008 Echo EF 50-55%, mild-mod  LVH, Gr 1 DD.  Marland Kitchen Peripheral vascular occlusive disease     a. 07/2011 Periph Angio: No signif Ao-illiac dzs, LCF stenosis, 100% LSFA w recon above knee pop and 3 vessel runoff, 100% RSFA w/ recon above knee --> pending L Fem to below Knee pop bypass.  Marland Kitchen History of tobacco abuse   . Hypertension     takes Amlodipine,Metoprolol,and Prinivil daily  . Dyspnea on exertion     with exertion  . Cataract     bilateral  . Dialysis patient   . PUD (peptic ulcer disease) mid 1990's    with GI bleed.  endoscoped in mid 1990's at [facility]  . Stroke 2005    no residual  . Asthma     08/2012  . GERD (gastroesophageal reflux disease)   . History of blood transfusion     peptic ulcer  . ESRD (end stage renal disease)     a. MWF dialysis in [place] (followed by Dr. Lowanda Foster)  . COPD (chronic obstructive pulmonary disease) 04/01/2013    Past Surgical History   Procedure Laterality Date  . Arteriovenous graft placement    . Av fistula placement  12/10/2000    Right brachiocephalic arteriovenous   . Aortagram  07/29/2011    Abdominal Aortagram  . Cardiac catheterization  08/01/11    Left heart catheterization  . Femoral-popliteal bypass graft  09/09/2011    Procedure: BYPASS GRAFT FEMORAL-POPLITEAL ARTERY;  Surgeon: Serafina Mitchell, MD;  Location: [place];  Service: Vascular;  Laterality: Left;  . Amputation  09/12/2011    Procedure: AMPUTATION BELOW KNEE;  Surgeon: Serafina Mitchell, MD;  Location[facility] OR;  Service: Vascular;  Laterality: Left;  TRANSMETATARSAL  . Application of wound vac  10/30/2011    Procedure: APPLICATION OF WOUND VAC;  Surgeon: Serafina Mitchell, MD;  Location: [place];  Service: Vascular;  Laterality: Left;  . Groin debridement  11/01/2011    Procedure: Virl Son DEBRIDEMENT;  Surgeon: Angelia Mould, MD;  Location: [facility] OR;  Service: Vascular;  Laterality: Left;  . Esophagogastroduodenoscopy  11/06/2011    Procedure: ESOPHAGOGASTRODUODENOSCOPY (EGD);  Surgeon: Jerene Bears, MD;  Location: [place];  Service: Gastroenterology;  Laterality: N/A;  . Femoral-popliteal bypass graft  01/02/2012    Procedure: BYPASS GRAFT FEMORAL-POPLITEAL ARTERY;  Surgeon: Serafina Mitchell, MD;  Location: [place];  Service: Vascular;  Laterality: Right;  . Amputation  01/02/2012    Procedure: AMPUTATION RAY;  Surgeon: Serafina Mitchell, MD;  Location: [facility] OR;  Service: Vascular;  Laterality: Right;  . Toe amputation Right     5/13- great toe  . Exchange of a dialysis catheter N/A 07/15/2012    Procedure: EXCHANGE OF A DIALYSIS CATHETER;  Surgeon: Conrad [place], MD;  Location: [place];  Service: Vascular;  Laterality: N/A;  . Av fistula placement Left 07/15/2012    Procedure: ARTERIOVENOUS (AV) FISTULA CREATION;  Surgeon: Conrad Centuria, MD;  Location: [place];  Service: Vascular;  Laterality: Left;  . Av fistula placement Left 09/07/2012    Procedure: INSERTION OF  ARTERIOVENOUS (AV) GORE-TEX GRAFT ARM;  Surgeon: Conrad Klawock, MD;  Location: [place];  Service: Vascular;  Laterality: Left;  . Insertion of dialysis catheter Right 09/07/2012    Procedure: INSERTION OF a Femoral DIALYSIS CATHETER;  Surgeon: Conrad Millersburg, MD;  Location: [place];  Service: Vascular;  Laterality: Right;  . Lipoma excision Left 09/07/2012    Procedure: EXCISION Seroma left arm;  Surgeon:  Conrad [place], MD;  Location: [place];  Service: Vascular;  Laterality: Left;  . Hernia repair      umbilical  . Thrombectomy and revision of arterioventous (av) goretex  graft Left 11/30/2012    Procedure: THROMBECTOMY AND REVISION OF ARTERIOVENTOUS (AV) GORETEX  GRAFT;  Surgeon: Angelia Mould, MD;  Location: [place];  Service: Vascular;  Laterality: Left;    Brynda Greathouse, MS RD LDN Clinical Inpatient Dietitian Pager: [phone] Weekend/After hours pager: [phone]

## 2013-04-01 NOTE — ED Notes (Signed)
Gave reports to [facility], RN on 2H, there is no bed or monitor in the room currently but they are working on getting one, they will call back as soon as the bed is ready.

## 2013-04-01 NOTE — ED Notes (Signed)
Pt c/o increased sob. Pt just finished neb treatment a few mins ago.

## 2013-04-01 NOTE — Procedures (Signed)
Pt seen on HD.  HD just getting started.  TRY for 3 liters.   I think most of his resp distress is from intrinsic lung Ds rather than fluid but will see what pulling 3L will do.

## 2013-04-01 NOTE — ED Provider Notes (Signed)
CSN: [id number]     Arrival date & time 04/01/13  5397 History   First MD Initiated Contact with Patient 04/01/13 [phone]     Chief Complaint  Patient presents with  . Shortness of Breath   (Consider location/radiation/quality/duration/timing/severity/associated sxs/prior Treatment) Patient is a 63 y.o. male presenting with shortness of breath. The history is provided by the patient.  Shortness of Breath Severity:  Moderate Associated symptoms: cough   Associated symptoms: no abdominal pain, no chest pain, no headaches, no rash and no vomiting    patient presents with shortness of breath since 5:30 this morning. He states he feels wheezes. He states he does not feel I'm overloaded. He was seen at anytime 2 days ago and had a short run of dialysis that he had a full dialysis run yesterday. She is due to be dialyzed this morning. The chest pain. He's had a little bit of cough with no sputum production. No chest pain. No abdominal pain.  Past Medical History  Diagnosis Date  . Hyperlipidemia   . Arthritis   . Claudication   . Anemia   . Chronic diastolic CHF (congestive heart failure) 07/2010    a. 07/2008 Echo EF 50-55%, mild-mod LVH, Gr 1 DD.  Marland Kitchen Peripheral vascular occlusive disease     a. 07/2011 Periph Angio: No signif Ao-illiac dzs, LCF stenosis, 100% LSFA w recon above knee pop and 3 vessel runoff, 100% RSFA w/ recon above knee --> pending L Fem to below Knee pop bypass.  Marland Kitchen History of tobacco abuse   . Hypertension     takes Amlodipine,Metoprolol,and Prinivil daily  . Dyspnea on exertion     with exertion  . Cataract     bilateral  . Dialysis patient   . PUD (peptic ulcer disease) mid 1990's    with GI bleed.  endoscoped in mid 1990's at [facility]  . Stroke 2005    no residual  . Asthma     08/2012  . GERD (gastroesophageal reflux disease)   . History of blood transfusion     peptic ulcer  . ESRD (end stage renal disease)     a. MWF dialysis in [place] (followed by Dr.  Lowanda Foster)  . COPD (chronic obstructive pulmonary disease) 04/01/2013   Past Surgical History  Procedure Laterality Date  . Arteriovenous graft placement    . Av fistula placement  12/10/2000    Right brachiocephalic arteriovenous   . Aortagram  07/29/2011    Abdominal Aortagram  . Cardiac catheterization  08/01/11    Left heart catheterization  . Femoral-popliteal bypass graft  09/09/2011    Procedure: BYPASS GRAFT FEMORAL-POPLITEAL ARTERY;  Surgeon: Serafina Mitchell, MD;  Location: [place];  Service: Vascular;  Laterality: Left;  . Amputation  09/12/2011    Procedure: AMPUTATION BELOW KNEE;  Surgeon: Serafina Mitchell, MD;  Location: [facility] OR;  Service: Vascular;  Laterality: Left;  TRANSMETATARSAL  . Application of wound vac  10/30/2011    Procedure: APPLICATION OF WOUND VAC;  Surgeon: Serafina Mitchell, MD;  Location: [place];  Service: Vascular;  Laterality: Left;  . Groin debridement  11/01/2011    Procedure: Virl Son DEBRIDEMENT;  Surgeon: Angelia Mould, MD;  Location: [facility] OR;  Service: Vascular;  Laterality: Left;  . Esophagogastroduodenoscopy  11/06/2011    Procedure: ESOPHAGOGASTRODUODENOSCOPY (EGD);  Surgeon: Jerene Bears, MD;  Location: [place];  Service: Gastroenterology;  Laterality: N/A;  . Femoral-popliteal bypass graft  01/02/2012    Procedure: BYPASS GRAFT  FEMORAL-POPLITEAL ARTERY;  Surgeon: Serafina Mitchell, MD;  Location: [place];  Service: Vascular;  Laterality: Right;  . Amputation  01/02/2012    Procedure: AMPUTATION RAY;  Surgeon: Serafina Mitchell, MD;  Location: [facility] OR;  Service: Vascular;  Laterality: Right;  . Toe amputation Right     5/13- great toe  . Exchange of a dialysis catheter N/A 07/15/2012    Procedure: EXCHANGE OF A DIALYSIS CATHETER;  Surgeon: Conrad [place], MD;  Location: [place];  Service: Vascular;  Laterality: N/A;  . Av fistula placement Left 07/15/2012    Procedure: ARTERIOVENOUS (AV) FISTULA CREATION;  Surgeon: Conrad [place]burg, MD;  Location: [place];  Service: Vascular;   Laterality: Left;  . Av fistula placement Left 09/07/2012    Procedure: INSERTION OF ARTERIOVENOUS (AV) GORE-TEX GRAFT ARM;  Surgeon: Conrad Skwent[place], MD;  Location: [place];  Service: Vascular;  Laterality: Left;  . Insertion of dialysis catheter Right 09/07/2012    Procedure: INSERTION OF a Femoral DIALYSIS CATHETER;  Surgeon: Conrad Mount Wolf, MD;  Location: [place];  Service: Vascular;  Laterality: Right;  . Lipoma excision Left 09/07/2012    Procedure: EXCISION Seroma left arm;  Surgeon: Conrad [place]wn, MD;  Location: [place];  Service: Vascular;  Laterality: Left;  . Hernia repair      umbilical  . Thrombectomy and revision of arterioventous (av) goretex  graft Left 11/30/2012    Procedure: THROMBECTOMY AND REVISION OF ARTERIOVENTOUS (AV) GORETEX  GRAFT;  Surgeon: Angelia Mould, MD;  Location: [facility] OR;  Service: Vascular;  Laterality: Left;   Family History  Problem Relation Age of Onset  . Breast cancer Mother   . Cancer Mother   . Cancer Father   . Anesthesia problems Neg Hx   . Hypotension Neg Hx   . Malignant hyperthermia Neg Hx   . Pseudochol deficiency Neg Hx    History  Substance Use Topics  . Smoking status: Current Every Day Smoker -- 0.50 packs/day for 40 years    Types: Cigarettes  . Smokeless tobacco: Never Used     Comment: quit, but still "sneaks a smoke" occasionally  . Alcohol Use: No    Review of Systems  Constitutional: Negative for activity change and appetite change.  Eyes: Negative for pain.  Respiratory: Positive for cough and shortness of breath. Negative for chest tightness.   Cardiovascular: Negative for chest pain and leg swelling.  Gastrointestinal: Negative for nausea, vomiting, abdominal pain and diarrhea.  Genitourinary: Negative for flank pain.  Musculoskeletal: Negative for back pain and neck stiffness.  Skin: Negative for rash.  Neurological: Negative for weakness, numbness and headaches.  Psychiatric/Behavioral: Negative for behavioral problems.     Allergies  Ambien  Home Medications   No current outpatient prescriptions on file. BP 113/59  Pulse 67  Temp(Src) 97.5 F (36.4 C) (Oral)  Resp 24  Ht 5\' 6"  (1.676 m)  Wt 136 lb 7.4 oz (61.9 kg)  BMI 22.04 kg/m2  SpO2 91% Physical Exam  Nursing note and vitals reviewed. Constitutional: He is oriented to person, place, and time. He appears well-developed and well-nourished.  HENT:  Head: Normocephalic and atraumatic.  Eyes: EOM are normal. Pupils are equal, round, and reactive to light.  Neck: Normal range of motion. Neck supple.  Cardiovascular: Normal rate, regular rhythm and normal heart sounds.   No murmur heard. Pulmonary/Chest: Effort normal. He has wheezes.  Diffuse wheezes and prolonged expirations.  Abdominal: Soft. Bowel sounds are normal.  He exhibits no distension and no mass. There is no tenderness. There is no rebound and no guarding.  Musculoskeletal: Normal range of motion. He exhibits no edema.  Ray amputation of left foot  Neurological: He is alert and oriented to person, place, and time. No cranial nerve deficit.  Skin: Skin is warm and dry.  Psychiatric: He has a normal mood and affect.    ED Course  Procedures (including critical care time) Labs Review Labs Reviewed  CBC WITH DIFFERENTIAL - Abnormal; Notable for the following:    RBC 4.16 (*)    Hemoglobin 10.9 (*)    HCT 36.0 (*)    RDW 21.5 (*)    Platelets 137 (*)    All other components within normal limits  POCT I-STAT, CHEM 8 - Abnormal; Notable for the following:    Potassium 3.2 (*)    Creatinine, Ser 4.80 (*)    All other components within normal limits  MRSA PCR SCREENING  GLUCOSE, CAPILLARY  PHOSPHORUS  POCT I-STAT TROPONIN I   Imaging Review Dg Chest Port 1 View  04/01/2013   CLINICAL DATA:  Shortness of breath and cough  EXAM: PORTABLE CHEST - 1 VIEW  COMPARISON:  March 30, 2013  FINDINGS: The heart size and mediastinal contours are stable. The heart size is enlarged. There  are bilateral pleural effusions unchanged. Consolidation of the bilateral mid and lung bases are unchanged. There is increased diffuse pulmonary interstitium bilaterally unchanged. The visualized skeletal structures are stable.  IMPRESSION: Persistent interstitial edema unchanged compared to prior exam. Persists in bilateral moderate pleural effusions with consolidation of both lungs unchanged.   Electronically Signed   By: Abelardo Diesel M.D.   On: 04/01/2013 07:44    EKG Interpretation    Date/Time:  Friday April 01 2013 07:21:51 EST Ventricular Rate:  80 PR Interval:  167 QRS Duration: 102 QT Interval:  429 QTC Calculation: 495 R Axis:   11 Text Interpretation:  Atrial-paced complexes Probable left atrial enlargement LVH with secondary repolarization abnormality Minimal ST elevation, inferior leads Borderline prolonged QT interval No significant change since last tracing Confirmed by Mehran Guderian  MD, Saintclair Schroader ([id number]) on 04/01/2013 4:51:06 PM            MDM   1. Acute diastolic heart failure   2. COPD (chronic obstructive pulmonary disease)   3. End stage renal disease on dialysis    Patient with shortness of breath and wheezes. Likely combination of volume overload and COPD. Potassium is reassuring. X-rays shows persistent pulmonary edema from before he had dialysis the last 2 days. Limit to internal medicine and have discussed with nephrology    Jasper Riling. Alvino Chapel, [place] 04/01/13 [phone]

## 2013-04-01 NOTE — Progress Notes (Signed)
04/01/2013 2:11 PM To HD with staff on monitor  [name], [name]

## 2013-04-01 NOTE — Consult Note (Signed)
[facility] Consult Note     Date: 04/01/2013                  Patient Name:  [name]  MRN: [id number]  DOB: 11-14-1950  Age / Sex: 63 y.o., male         PCP: Redge Gainer, MD                 Service Requesting Consult: ED Physician                 Reason for Consult: Admit for COPD with pulmonary edema, already on HD            Chief Complaint: shortness of breath HPI: Pt is a 63yo male with ESRD on HD MWF in [place] (primary nephrologist Dr. Lowanda Foster) presenting to the ED for SOB and cough (+/- production) for about 1 week, worse for the past few days; PMH significant for CAD / PVD, HTN, CHF, CVA, and is s/p left transmetatarsal and right great toe amputations. Nephrology consulted for HD and fluid / volume management.  Pt reports he has had SOB for about a week, worse for the past two days, and bad enough that he felt he couldn't breathe at all this morning starting around 0530. He had a full dialysis session on Monday 1/5, a partial session on 1/7, and a full (extra) session yesterday 1/8. He was due to go to dialysis today but felt too sick and came to the ED; of note, he was put on CPAP and had breathing treatments in the ED. He denies chest pain, abdominal pain. He has not had fevers. He reports compliance with medications at home.   Of note, pt was seen in the ED at [facility] on 1/7 for similar complaints and was sent to dialysis immediately and had extra dialysis yesterday, as per pt-given history above. Pt has had admissions in the past for similar complaints and has had recurrent pulmonary edema / respiratory failure; recently he required admission on 10/4 to hospitalist service and again on 11/18 to MICU requiring intubation.  Past Medical History  Diagnosis Date  . Hyperlipidemia   . Arthritis   . Claudication   . Anemia   . Chronic diastolic CHF (congestive heart failure) 07/2010    a. 07/2008 Echo EF 50-55%, mild-mod LVH, Gr 1 DD.  Marland Kitchen Peripheral  vascular occlusive disease     a. 07/2011 Periph Angio: No signif Ao-illiac dzs, LCF stenosis, 100% LSFA w recon above knee pop and 3 vessel runoff, 100% RSFA w/ recon above knee --> pending L Fem to below Knee pop bypass.  Marland Kitchen History of tobacco abuse   . Hypertension     takes Amlodipine,Metoprolol,and Prinivil daily  . Dyspnea on exertion     with exertion  . Cataract     bilateral  . Dialysis patient   . PUD (peptic ulcer disease) mid 1990's    with GI bleed.  endoscoped in mid 1990's at [facility]  . Stroke 2005    no residual  . Asthma     08/2012  . GERD (gastroesophageal reflux disease)   . History of blood transfusion     peptic ulcer  . ESRD (end stage renal disease)     a. MWF dialysis in [place] (followed by Dr. Lowanda Foster)  . COPD (chronic obstructive pulmonary disease) 04/01/2013    Past Surgical History  Procedure Laterality Date  . Arteriovenous graft placement    .  Av fistula placement  12/10/2000    Right brachiocephalic arteriovenous   . Aortagram  07/29/2011    Abdominal Aortagram  . Cardiac catheterization  08/01/11    Left heart catheterization  . Femoral-popliteal bypass graft  09/09/2011    Procedure: BYPASS GRAFT FEMORAL-POPLITEAL ARTERY;  Surgeon: Serafina Mitchell, MD;  Location: [place];  Service: Vascular;  Laterality: Left;  . Amputation  09/12/2011    Procedure: AMPUTATION BELOW KNEE;  Surgeon: Serafina Mitchell, MD;  Location: [facility] OR;  Service: Vascular;  Laterality: Left;  TRANSMETATARSAL  . Application of wound vac  10/30/2011    Procedure: APPLICATION OF WOUND VAC;  Surgeon: Serafina Mitchell, MD;  Location: [place];  Service: Vascular;  Laterality: Left;  . Groin debridement  11/01/2011    Procedure: Virl Son DEBRIDEMENT;  Surgeon: Angelia Mould, MD;  Location: [facility] OR;  Service: Vascular;  Laterality: Left;  . Esophagogastroduodenoscopy  11/06/2011    Procedure: ESOPHAGOGASTRODUODENOSCOPY (EGD);  Surgeon: Jerene Bears, MD;  Location: [place];  Service:  Gastroenterology;  Laterality: N/A;  . Femoral-popliteal bypass graft  01/02/2012    Procedure: BYPASS GRAFT FEMORAL-POPLITEAL ARTERY;  Surgeon: Serafina Mitchell, MD;  Location: [place];  Service: Vascular;  Laterality: Right;  . Amputation  01/02/2012    Procedure: AMPUTATION RAY;  Surgeon: Serafina Mitchell, MD;  Location: [facility] OR;  Service: Vascular;  Laterality: Right;  . Toe amputation Right     5/13- great toe  . Exchange of a dialysis catheter N/A 07/15/2012    Procedure: EXCHANGE OF A DIALYSIS CATHETER;  Surgeon: Conrad G[place] Mills, MD;  Location: [place];  Service: Vascular;  Laterality: N/A;  . Av fistula placement Left 07/15/2012    Procedure: ARTERIOVENOUS (AV) FISTULA CREATION;  Surgeon: Conrad El Dorado, MD;  Location: [place];  Service: Vascular;  Laterality: Left;  . Av fistula placement Left 09/07/2012    Procedure: INSERTION OF ARTERIOVENOUS (AV) GORE-TEX GRAFT ARM;  Surgeon: Conrad George Mason, MD;  Location: [place];  Service: Vascular;  Laterality: Left;  . Insertion of dialysis catheter Right 09/07/2012    Procedure: INSERTION OF a Femoral DIALYSIS CATHETER;  Surgeon: Conrad Plain, MD;  Location: [place];  Service: Vascular;  Laterality: Right;  . Lipoma excision Left 09/07/2012    Procedure: EXCISION Seroma left arm;  Surgeon: Conrad , MD;  Location: [place];  Service: Vascular;  Laterality: Left;  . Hernia repair      umbilical  . Thrombectomy and revision of arterioventous (av) goretex  graft Left 11/30/2012    Procedure: THROMBECTOMY AND REVISION OF ARTERIOVENTOUS (AV) GORETEX  GRAFT;  Surgeon: Angelia Mould, MD;  Location: [facility] OR;  Service: Vascular;  Laterality: Left;    Family History  Problem Relation Age of Onset  . Breast cancer Mother   . Cancer Mother   . Cancer Father   . Anesthesia problems Neg Hx   . Hypotension Neg Hx   . Malignant hyperthermia Neg Hx   . Pseudochol deficiency Neg Hx    Social History:  reports that he has been smoking Cigarettes.  He has a 20 pack-year  smoking history. He has never used smokeless tobacco. He reports that he does not drink alcohol or use illicit drugs.  Allergies:  Allergies  Allergen Reactions  . Ambien [Zolpidem Tartrate] Other (See Comments)    Hallucinations     (Not in a hospital admission)  Results for orders placed during the hospital encounter of 04/01/13 (  from the past 48 hour(s))  CBC WITH DIFFERENTIAL     Status: Abnormal   Collection Time    04/01/13  7:41 AM      Result Value Range   WBC 6.0  4.0 - 10.5 K/uL   RBC 4.16 (*) 4.22 - 5.81 MIL/uL   Hemoglobin 10.9 (*) 13.0 - 17.0 g/dL   HCT 36.0 (*) 39.0 - 52.0 %   MCV 86.5  78.0 - 100.0 fL   MCH 26.2  26.0 - 34.0 pg   MCHC 30.3  30.0 - 36.0 g/dL   RDW 21.5 (*) 11.5 - 15.5 %   Platelets 137 (*) 150 - 400 K/uL   Comment: PLATELET COUNT CONFIRMED BY SMEAR   Neutrophils Relative % 77  43 - 77 %   Lymphocytes Relative 13  12 - 46 %   Monocytes Relative 7  3 - 12 %   Eosinophils Relative 2  0 - 5 %   Basophils Relative 1  0 - 1 %   Neutro Abs 4.6  1.7 - 7.7 K/uL   Lymphs Abs 0.8  0.7 - 4.0 K/uL   Monocytes Absolute 0.4  0.1 - 1.0 K/uL   Eosinophils Absolute 0.1  0.0 - 0.7 K/uL   Basophils Absolute 0.1  0.0 - 0.1 K/uL   Smear Review MORPHOLOGY UNREMARKABLE    POCT I-STAT TROPONIN I     Status: None   Collection Time    04/01/13  7:53 AM      Result Value Range   Troponin i, poc 0.07  0.00 - 0.08 ng/mL   Comment 3            Comment: Due to the release kinetics of cTnI,     a negative result within the first hours     of the onset of symptoms does not rule out     myocardial infarction with certainty.     If myocardial infarction is still suspected,     repeat the test at appropriate intervals.  POCT I-STAT, CHEM 8     Status: Abnormal   Collection Time    04/01/13  7:54 AM      Result Value Range   Sodium 142  137 - 147 mEq/L   Potassium 3.2 (*) 3.7 - 5.3 mEq/L   Chloride 101  96 - 112 mEq/L   BUN 21  6 - 23 mg/dL   Creatinine, Ser 4.80  (*) 0.50 - 1.35 mg/dL   Glucose, Bld 92  70 - 99 mg/dL   Calcium, Ion 1.16  1.13 - 1.30 mmol/L   TCO2 31  0 - 100 mmol/L   Hemoglobin 13.3  13.0 - 17.0 g/dL   HCT 39.0  39.0 - 52.0 %   Dg Chest Port 1 View  04/01/2013   CLINICAL DATA:  Shortness of breath and cough  EXAM: PORTABLE CHEST - 1 VIEW  COMPARISON:  March 30, 2013  FINDINGS: The heart size and mediastinal contours are stable. The heart size is enlarged. There are bilateral pleural effusions unchanged. Consolidation of the bilateral mid and lung bases are unchanged. There is increased diffuse pulmonary interstitium bilaterally unchanged. The visualized skeletal structures are stable.  IMPRESSION: Persistent interstitial edema unchanged compared to prior exam. Persists in bilateral moderate pleural effusions with consolidation of both lungs unchanged.   Electronically Signed   By: Abelardo Diesel M.D.   On: 04/01/2013 07:44    ROS: As above in HPI. Otherwise feels "okay. Denies fever /  chills, N/V, change in bowel habits. Otherwise, full 12-system ROS was reviewed and all negative.  Blood pressure 190/57, pulse 72, temperature 98.3 F (36.8 C), temperature source Oral, resp. rate 20, SpO2 90.00%. 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 COPD exacerbation + recurrent pulmonary edema, ?component of CHF:   Uncertain trigger, ?viral or other pulmonary process, as pt denies missing HD appointments. CXR suggests interstitial edema but no definite consolidation / PNA and pt does not have leukocytosis or fever. Some improvement in the ED with breathing treatments and short duration of CPAP, now on [place]. - needs HD today, may need extra session(s) over the weekend - previous notes indicate considering readjusting HD duration / target dry weight given recurrence of edema - otherwise COPD management per primary team - will need to communicate with outpt dialysis center as well  2. ESRD - on HD MWF in [place]; does not make "much" urine at baseline, likely HD as above 3. CHF / CAD - HD for volume management, as above, other management per primary team 4. HTN - elevated today, ?fluid overload, expect to improve with HD 5. Anemia of chronic disease - related to ESRD, Hb appears stable, not on EPO 6. Secondary hyperPTH - on Phoslo at home, has been on Sensipar in the past?; monitor with labs on HD 7. Mild hypokalemia - can manage with K bath on HD  Please see also pending attending cosign for edits / additions.  Emmaline Kluver, MD PGY-2, [facility] 04/01/2013, 11:53 AM I have seen and examined this patient and agree with plan per Dr Venetia Maxon. Pt seen yest.   63yo BM with ESRD on HD in [place]. Admitted for SOB combination of intrinsic lung ds and CHF.  Will plan HD today to pull fluid to see if helps and if so will try to pull more fluid on Sat.   Jesson Foskey T,MD 04/02/2013 4:12 PM

## 2013-04-01 NOTE — Progress Notes (Addendum)
ANTIBIOTIC CONSULT NOTE - INITIAL  Pharmacy Consult for vancomycin Indication: pneumonia  Allergies  Allergen Reactions  . Ambien [Zolpidem Tartrate] Other (See Comments)    Hallucinations    Patient Measurements: Height: 5\' 6"  (167.6 cm) Weight: 136 lb 7.4 oz (61.9 kg) IBW/kg (Calculated) : 63.8   Vital Signs: Temp: 97.5 F (36.4 C) (01/09 1425) Temp src: Oral (01/09 1425) BP: 121/46 mmHg (01/09 1700) Pulse Rate: 64 (01/09 1700) Intake/Output from previous day:   Intake/Output from this shift: Total I/O In: 480 [P.O.:480] Out: -   Labs:  Recent Labs  04/01/13 0741 04/01/13 0754  WBC 6.0  --   HGB 10.9* 13.3  PLT 137*  --   CREATININE  --  4.80*   Estimated Creatinine Clearance: 13.6 ml/min (by C-G formula based on Cr of 4.8). No results found for this basename: VANCOTROUGH, Corlis Leak, VANCORANDOM, GENTTROUGH, GENTPEAK, GENTRANDOM, TOBRATROUGH, TOBRAPEAK, TOBRARND, AMIKACINPEAK, AMIKACINTROU, AMIKACIN,  in the last 72 hours   Microbiology: Recent Results (from the past 720 hour(s))  MRSA PCR SCREENING     Status: None   Collection Time    04/01/13 12:04 PM      Result Value Range Status   MRSA by PCR NEGATIVE  NEGATIVE Final   Comment:            The GeneXpert MRSA Assay (FDA     approved for NASAL specimens     only), is one component of a     comprehensive MRSA colonization     surveillance program. It is not     intended to diagnose MRSA     infection nor to guide or     monitor treatment for     MRSA infections.    Medical History: Past Medical History  Diagnosis Date  . Hyperlipidemia   . Arthritis   . Claudication   . Anemia   . Chronic diastolic CHF (congestive heart failure) 07/2010    a. 07/2008 Echo EF 50-55%, mild-mod LVH, Gr 1 DD.  Marland Kitchen Peripheral vascular occlusive disease     a. 07/2011 Periph Angio: No signif Ao-illiac dzs, LCF stenosis, 100% LSFA w recon above knee pop and 3 vessel runoff, 100% RSFA w/ recon above knee --> pending L  Fem to below Knee pop bypass.  Marland Kitchen History of tobacco abuse   . Hypertension     takes Amlodipine,Metoprolol,and Prinivil daily  . Dyspnea on exertion     with exertion  . Cataract     bilateral  . Dialysis patient   . PUD (peptic ulcer disease) mid 1990's    with GI bleed.  endoscoped in mid 1990's at [facility]  . Stroke 2005    no residual  . Asthma     08/2012  . GERD (gastroesophageal reflux disease)   . History of blood transfusion     peptic ulcer  . ESRD (end stage renal disease)     a. MWF dialysis in [place] (followed by Dr. Lowanda Foster)  . COPD (chronic obstructive pulmonary disease) 04/01/2013    Medications:  Prescriptions prior to admission  Medication Sig Dispense Refill  . ALPRAZolam (XANAX) 1 MG tablet Take 1 tablet (1 mg total) by mouth 2 (two) times daily as needed for anxiety or sleep.  60 tablet  2  . calcium acetate (PHOSLO) 667 MG capsule Take 1,334 mg by mouth 3 (three) times daily with meals.       . DULoxetine (CYMBALTA) 60 MG capsule Take 60 mg by mouth  daily.      . folic acid (FOLVITE) 1 MG tablet Take 1 mg by mouth daily.      Marland Kitchen gabapentin (NEURONTIN) 300 MG capsule Take 1 capsule (300 mg total) by mouth 3 (three) times daily.  90 capsule  11  . multivitamin (RENA-VIT) TABS tablet Take 1 tablet by mouth daily.      Marland Kitchen omeprazole (PRILOSEC) 20 MG capsule Take 20 mg by mouth daily.      Marland Kitchen oxyCODONE-acetaminophen (PERCOCET) 7.5-325 MG per tablet Take 1 tablet by mouth every 8 (eight) hours as needed for pain.  20 tablet  0  . PROAIR HFA 108 (90 BASE) MCG/ACT inhaler Inhale 1 puff into the lungs every 4 (four) hours as needed for wheezing.       Marland Kitchen tiZANidine (ZANAFLEX) 4 MG tablet Take 4 mg by mouth every 6 (six) hours as needed for muscle spasms.        Assessment: 63 yo man with ESRD on HD to start vancomycin for r/o PNA He has had extra HD sessions recently due to SOB.  His usual schedule is MWF  Goal of Therapy:  PreHD vanc levels 20-25 mg/L  Plan:   Vancomycin 1250 mg IV X 1 then 500 mg after each scheduled HD Will f/u future dialysis sessions. F/u clinical course and cultures.   Sameera Betton Poteet 04/01/2013,5:20 PM  Addum:  Add Cefepime 2gm IV after each HD

## 2013-04-01 NOTE — ED Notes (Signed)
Admitting dr at bedside.  

## 2013-04-02 ENCOUNTER — Inpatient Hospital Stay (HOSPITAL_COMMUNITY): Payer: Medicare Other

## 2013-04-02 DIAGNOSIS — J449 Chronic obstructive pulmonary disease, unspecified: Secondary | ICD-10-CM

## 2013-04-02 LAB — BASIC METABOLIC PANEL
BUN: 13 mg/dL (ref 6–23)
CALCIUM: 9.9 mg/dL (ref 8.4–10.5)
CO2: 28 mEq/L (ref 19–32)
Chloride: 99 mEq/L (ref 96–112)
Creatinine, Ser: 2.92 mg/dL — ABNORMAL HIGH (ref 0.50–1.35)
GFR calc Af Amer: 25 mL/min — ABNORMAL LOW (ref >90)
GFR, EST NON AFRICAN AMERICAN: 21 mL/min — AB (ref >90)
Glucose, Bld: 120 mg/dL — ABNORMAL HIGH (ref 70–99)
POTASSIUM: 4.4 meq/L (ref 3.7–5.3)
SODIUM: 141 meq/L (ref 137–147)

## 2013-04-02 LAB — TROPONIN I: Troponin I: <0.3 ng/mL (ref <0.30)

## 2013-04-02 LAB — GLUCOSE, CAPILLARY: GLUCOSE-CAPILLARY: 100 mg/dL — AB (ref 70–99)

## 2013-04-02 MED ORDER — HEPARIN SODIUM (PORCINE) 1000 UNIT/ML IJ SOLN
5800.0000 [IU] | Freq: Once | INTRAMUSCULAR | Status: AC
  Administered 2013-04-02: 5800 [IU] via INTRAVENOUS

## 2013-04-02 MED ORDER — IOHEXOL 300 MG/ML  SOLN
80.0000 mL | Freq: Once | INTRAMUSCULAR | Status: AC | PRN
  Administered 2013-04-02: 80 mL via INTRAVENOUS

## 2013-04-02 NOTE — Progress Notes (Signed)
[facility] PROGRESS NOTE  [name] [id number] DOB: 28-Mar-1950 DOA: 04/01/2013 PCP: Redge Gainer, MD  Assessment/Plan: . Bilateral loculated pleural effusion  -On plain x-ray, will obtain CT with contrast (OK with renal)  - Noted that CT of his chest in November showed moderate L. pleural effusion -Follow and consider pulmonary versus CVTS consultation pending CT .COPD with acute exacerbation  -continue nebulized bronchodilators, Solu-Medrol and mucolytics  -antibiotics to cover for probable HCAP as discussed below  -Continue supplemental oxygen and follow.  . Acute on chronic diastolic CHF +/-HCAP (healthcare-associated pneumonia)  -Dialysis per renal for volume control  -on empiric antibiotics for HCAP for now follow  . Acute on chronic respiratory failure  -Secondary to above see treatment as discussed  . End-stage renal disease on dialysis  -Dialysis per renal  . GERD  Continue PPI  . h/o Hypertension  -No outpatient medications listed on med rec, monitor BPs and treat as appropriate  . History of CVA  . Hypokalemia  -Resolved   Code Status: full Family Communication: None at the bedside Disposition Plan: Monitor in step today   Consultants:  renal  Procedures:  none  Antibiotics:  vanc and cefepime started on 1/9  HPI/Subjective: + Productive cough, states breathing better-on 4 L [place]  Objective: Filed Vitals:   04/02/13 0806  BP:   Pulse:   Temp: 98.2 F (36.8 C)  Resp:     Intake/Output Summary (Last 24 hours) at 04/02/13 0946 Last data filed at 04/02/13 0000  Gross per 24 hour  Intake   1060 ml  Output   2660 ml  Net  -1600 ml   Filed Weights   04/01/13 1200 04/01/13 1425 04/01/13 1824  Weight: 59.8 kg (131 lb 13.4 oz) 61.9 kg (136 lb 7.4 oz) 59.4 kg (130 lb 15.3 oz)    Exam:  General: alert & oriented x 3 In NAD Cardiovascular: RRR, nl S1 s2 Respiratory: Scattered wheezes and rhonchi bilaterally. Decreased breath  sounds at the bases Abdomen: soft +BS NT/ND, no masses palpable Extremities: No cyanosis and no edema    Data Reviewed: Basic Metabolic Panel:  Recent Labs Lab 03/30/13 1153 04/01/13 0754 04/01/13 1313 04/02/13 0330  NA  --  142  --  141  K 3.4* 3.2*  --  4.4  CL  --  101  --  99  CO2  --   --   --  28  GLUCOSE  --  92  --  120*  BUN  --  21  --  13  CREATININE  --  4.80*  --  2.92*  CALCIUM  --   --   --  9.9  PHOS  --   --  3.8  --    Liver Function Tests: No results found for this basename: AST, ALT, ALKPHOS, BILITOT, PROT, ALBUMIN,  in the last 168 hours No results found for this basename: LIPASE, AMYLASE,  in the last 168 hours No results found for this basename: AMMONIA,  in the last 168 hours CBC:  Recent Labs Lab 04/01/13 0741 04/01/13 0754  WBC 6.0  --   NEUTROABS 4.6  --   HGB 10.9* 13.3  HCT 36.0* 39.0  MCV 86.5  --   PLT 137*  --    Cardiac Enzymes:  Recent Labs Lab 04/01/13 2030 04/02/13 0200 04/02/13 0735  TROPONINI <0.30 <0.30 <0.30   BNP (last 3 results)  Recent Labs  12/25/12 1300 02/08/13 1903 04/01/13 2030  PROBNP 32295.0*  27711.0* 48968.0*   CBG:  Recent Labs Lab 04/01/13 1149  GLUCAP 73    Recent Results (from the past 240 hour(s))  MRSA PCR SCREENING     Status: None   Collection Time    04/01/13 12:04 PM      Result Value Range Status   MRSA by PCR NEGATIVE  NEGATIVE Final   Comment:            The GeneXpert MRSA Assay (FDA     approved for NASAL specimens     only), is one component of a     comprehensive MRSA colonization     surveillance program. It is not     intended to diagnose MRSA     infection nor to guide or     monitor treatment for     MRSA infections.     Studies: Dg Chest Port 1 View  04/02/2013   CLINICAL DATA:  Cough, COPD and infiltrates.  EXAM: PORTABLE CHEST - 1 VIEW  COMPARISON:  04/01/2013  FINDINGS: Lung volumes are slightly lower bilaterally compared to the prior exam. There remains  evidence of bilateral lower lobe airspace disease suspicious for pneumonia with associated interstitial edema and bilateral pleural effusions. The pleural effusions appear at least partially loculated bilaterally. Component of empyema cannot be excluded. The heart size is stable and mildly enlarged.  IMPRESSION: Lower bilateral lung volumes with persistent bilateral lower lobe airspace disease, interstitial edema and bilateral loculated pleural fluid. Presence of empyema cannot be excluded.   Electronically Signed   By: Aletta Edouard M.D.   On: 04/02/2013 08:23   Dg Chest Port 1 View  04/01/2013   CLINICAL DATA:  Shortness of breath and cough  EXAM: PORTABLE CHEST - 1 VIEW  COMPARISON:  March 30, 2013  FINDINGS: The heart size and mediastinal contours are stable. The heart size is enlarged. There are bilateral pleural effusions unchanged. Consolidation of the bilateral mid and lung bases are unchanged. There is increased diffuse pulmonary interstitium bilaterally unchanged. The visualized skeletal structures are stable.  IMPRESSION: Persistent interstitial edema unchanged compared to prior exam. Persists in bilateral moderate pleural effusions with consolidation of both lungs unchanged.   Electronically Signed   By: Abelardo Diesel M.D.   On: 04/01/2013 07:44    Scheduled Meds: . calcium acetate  2,001 mg Oral TID WC  . ceFEPime (MAXIPIME) IV  2 g Intravenous Q M,W,F-HD  . cinacalcet  30 mg Oral Q breakfast  . [START ON 04/04/2013] doxercalciferol  2.5 mcg Intravenous Q M,W,F-HD  . DULoxetine  60 mg Oral Daily  . enoxaparin (LOVENOX) injection  30 mg Subcutaneous Q24H  . feeding supplement (NEPRO CARB STEADY)  237 mL Oral BID BM  . folic acid  1 mg Oral Daily  . gabapentin  300 mg Oral TID  . guaiFENesin  600 mg Oral BID  . ipratropium  0.5 mg Nebulization TID  . levalbuterol  0.63 mg Nebulization TID PC & HS  . methylPREDNISolone (SOLU-MEDROL) injection  40 mg Intravenous Q12H  . multivitamin  1  tablet Oral QHS  . pantoprazole  40 mg Oral Daily  . sodium chloride  3 mL Intravenous Q12H  . [START ON 04/04/2013] vancomycin  500 mg Intravenous Q M,W,F-HD   Continuous Infusions:   Active Problems:   ESRD on hemodialysis   Acute diastolic heart failure   COPD with acute exacerbation   HCAP (healthcare-associated pneumonia)    Time spent: Stuart  C  [facility] Pager [phone]. If 7PM-7AM, please contact night-coverage at [email or web address], password [facility] 04/02/2013, 9:46 AM  LOS: 1 day

## 2013-04-02 NOTE — Progress Notes (Signed)
[name] Progress Note   Subjective:   Pt states breathing is some better, getting a breathing treatment during interview. Denies new swelling. States he thinks HD helped his breathing some, yesterday and had a little less than 3L off. No new pain, fever / chills, N/V.   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3  HCT 36.0* 39.0  MCV 86.5  --   PLT 137*  --    CXR 1/10 - FINDINGS:  Lung volumes are slightly lower bilaterally compared to the prior  exam. There remains evidence of bilateral lower lobe airspace  disease suspicious for pneumonia with associated interstitial edema  and bilateral pleural effusions. The pleural effusions appear at  least partially loculated bilaterally. Component  of empyema cannot  be excluded. The heart size is stable and mildly enlarged.  IMPRESSION:  Lower bilateral lung volumes with persistent bilateral lower lobe  airspace disease, interstitial edema and bilateral loculated pleural  fluid. Presence of empyema cannot be excluded.   Medications:    . calcium acetate  2,001 mg Oral TID WC  . ceFEPime (MAXIPIME) IV  2 g Intravenous Q M,W,F-HD  . cinacalcet  30 mg Oral Q breakfast  . [START ON 04/04/2013] doxercalciferol  2.5 mcg Intravenous Q M,W,F-HD  . DULoxetine  60 mg Oral Daily  . enoxaparin (LOVENOX) injection  30 mg Subcutaneous Q24H  . feeding supplement (NEPRO CARB STEADY)  237 mL Oral BID BM  . folic acid  1 mg Oral Daily  . gabapentin  300 mg Oral TID  . guaiFENesin  600 mg Oral BID  . ipratropium  0.5 mg Nebulization TID  . levalbuterol  0.63 mg Nebulization TID PC & HS  . methylPREDNISolone (SOLU-MEDROL) injection  40 mg Intravenous Q12H  . multivitamin  1 tablet Oral QHS  . pantoprazole  40 mg Oral Daily  . sodium chloride  3 mL Intravenous Q12H  . [  START ON 04/04/2013] vancomycin  500 mg Intravenous Q M,W,F-HD   Assessment/ Plan:   1. ESRD - on HD MWF in [place]; does not make "much" urine at baseline. Continue HD per schedule. May need extra sessions to help with fluid overload, but overall appears euvolemic and berathing likely secondary to lung pathology rather than fluid. 2. Mild hypokalemia - resolved  3. COPD exacerbation + recurrent/chronic pulmonary edema/effusions, ?component of CHF, possible HCAP:  - Uncertain trigger, ?viral or other pulmonary process, as pt denies missing HD appointments.  - COPD / pneumonia management per primary team; CXR suggests ?loculated effusions  4. CHF / CAD - HD for volume management, as above, other management per primary team  5. HTN - elevated intermittently, improved s/p HD and with breathing treatments 6. Anemia of chronic disease - related to ESRD, Hb appears stable, not on EPO  7.  Secondary hyperPTH - continue Phoslo, Sensipar, Hectorol in the past; monitor with labs on HD   Please see also pending attending cosign for edits / additions.  Earl Kluver, MD PGY-2, [name] 04/02/2013, 8:54 AM I have seen and examined this patient and agree with plan per Dr [name].  Pt seen on 04/02/13 but note not signed.  He feels better.  Will plan HD again today to try to remove more fluid. [name] T,MD 04/05/2013 5:02 PM

## 2013-04-02 NOTE — Procedures (Signed)
Pt seen on HD.  Ap 130 Vp 220.  SBP 181, will increase goal to 3 liters.  BFR 450.

## 2013-04-03 DIAGNOSIS — R590 Localized enlarged lymph nodes: Secondary | ICD-10-CM

## 2013-04-03 DIAGNOSIS — I5033 Acute on chronic diastolic (congestive) heart failure: Secondary | ICD-10-CM

## 2013-04-03 DIAGNOSIS — R599 Enlarged lymph nodes, unspecified: Secondary | ICD-10-CM

## 2013-04-03 DIAGNOSIS — J90 Pleural effusion, not elsewhere classified: Secondary | ICD-10-CM

## 2013-04-03 DIAGNOSIS — J9620 Acute and chronic respiratory failure, unspecified whether with hypoxia or hypercapnia: Secondary | ICD-10-CM

## 2013-04-03 DIAGNOSIS — J811 Chronic pulmonary edema: Secondary | ICD-10-CM

## 2013-04-03 LAB — CBC
HCT: 38.3 % — ABNORMAL LOW (ref 39.0–52.0)
Hemoglobin: 11.2 g/dL — ABNORMAL LOW (ref 13.0–17.0)
MCH: 25.6 pg — ABNORMAL LOW (ref 26.0–34.0)
MCHC: 29.2 g/dL — ABNORMAL LOW (ref 30.0–36.0)
MCV: 87.4 fL (ref 78.0–100.0)
Platelets: 215 10*3/uL (ref 150–400)
RBC: 4.38 MIL/uL (ref 4.22–5.81)
RDW: 20.9 % — ABNORMAL HIGH (ref 11.5–15.5)
WBC: 5.6 10*3/uL (ref 4.0–10.5)

## 2013-04-03 LAB — BASIC METABOLIC PANEL
BUN: 14 mg/dL (ref 6–23)
CO2: 30 mEq/L (ref 19–32)
Calcium: 9.8 mg/dL (ref 8.4–10.5)
Chloride: 93 mEq/L — ABNORMAL LOW (ref 96–112)
Creatinine, Ser: 2.52 mg/dL — ABNORMAL HIGH (ref 0.50–1.35)
GFR calc Af Amer: 29 mL/min — ABNORMAL LOW (ref >90)
GFR, EST NON AFRICAN AMERICAN: 25 mL/min — AB (ref >90)
Glucose, Bld: 119 mg/dL — ABNORMAL HIGH (ref 70–99)
POTASSIUM: 3.8 meq/L (ref 3.7–5.3)
SODIUM: 138 meq/L (ref 137–147)

## 2013-04-03 LAB — PROCALCITONIN: Procalcitonin: 2.72 ng/mL

## 2013-04-03 MED ORDER — PREDNISONE 50 MG PO TABS
60.0000 mg | ORAL_TABLET | Freq: Every day | ORAL | Status: DC
  Administered 2013-04-04 – 2013-04-07 (×4): 60 mg via ORAL
  Filled 2013-04-03 (×5): qty 1

## 2013-04-03 MED ORDER — ALBUTEROL SULFATE (2.5 MG/3ML) 0.083% IN NEBU: 2.5000 mg | INHALATION_SOLUTION | RESPIRATORY_TRACT | Status: DC | PRN

## 2013-04-03 MED ORDER — LEVOFLOXACIN 750 MG PO TABS
750.0000 mg | ORAL_TABLET | Freq: Once | ORAL | Status: AC
  Administered 2013-04-03: 750 mg via ORAL
  Filled 2013-04-03: qty 1

## 2013-04-03 MED ORDER — DEXTROSE 5 % IV SOLN
2.0000 g | Freq: Once | INTRAVENOUS | Status: DC
  Filled 2013-04-03: qty 2

## 2013-04-03 MED ORDER — SODIUM CHLORIDE 0.9 % IV SOLN
500.0000 mg | Freq: Once | INTRAVENOUS | Status: DC
  Filled 2013-04-03: qty 500

## 2013-04-03 MED ORDER — IPRATROPIUM-ALBUTEROL 0.5-2.5 (3) MG/3ML IN SOLN
3.0000 mL | Freq: Four times a day (QID) | RESPIRATORY_TRACT | Status: DC
Start: 2013-04-03 — End: 2013-04-06
  Administered 2013-04-03 – 2013-04-06 (×10): 3 mL via RESPIRATORY_TRACT
  Filled 2013-04-03 (×10): qty 3

## 2013-04-03 MED ORDER — LEVOFLOXACIN 500 MG PO TABS
500.0000 mg | ORAL_TABLET | ORAL | Status: DC
  Administered 2013-04-05 – 2013-04-07 (×2): 500 mg via ORAL
  Filled 2013-04-03 (×2): qty 1

## 2013-04-03 NOTE — Progress Notes (Signed)
S: Breathing better O:BP 156/37  Pulse 59  Temp(Src) 98.4 F (36.9 C) (Oral)  Resp 16  Ht 5\' 6"  (1.676 m)  Wt 55.9 kg (123 lb 3.8 oz)  BMI 19.90 kg/m2  SpO2 99%  Intake/Output Summary (Last 24 hours) at 04/03/13 0707 Last data filed at 04/02/13 2200  Gross per 24 hour  Intake    417 ml  Output   3000 ml  Net  -2583 ml   Weight change: -0.9 kg (-1 lb 15.7 oz) TKZ:SWFUX and alert CVS:RRR Resp:decreased BS bases R>l.  Wheezing better Abd:+ BS NTND Ext: No edema L AVG + bruit NEURO:CNI Ox3 no asterixis   . calcium acetate  2,001 mg Oral TID WC  . ceFEPime (MAXIPIME) IV  2 g Intravenous Q M,W,F-HD  . cinacalcet  30 mg Oral Q breakfast  . [START ON 04/04/2013] doxercalciferol  2.5 mcg Intravenous Q M,W,F-HD  . DULoxetine  60 mg Oral Daily  . enoxaparin (LOVENOX) injection  30 mg Subcutaneous Q24H  . feeding supplement (NEPRO CARB STEADY)  237 mL Oral BID BM  . folic acid  1 mg Oral Daily  . gabapentin  300 mg Oral TID  . guaiFENesin  600 mg Oral BID  . ipratropium  0.5 mg Nebulization TID  . methylPREDNISolone (SOLU-MEDROL) injection  40 mg Intravenous Q12H  . multivitamin  1 tablet Oral QHS  . pantoprazole  40 mg Oral Daily  . sodium chloride  3 mL Intravenous Q12H  . [START ON 04/04/2013] vancomycin  500 mg Intravenous Q M,W,F-HD   Ct Chest W Contrast  04/02/2013   CLINICAL DATA:  63 year old male with shortness of breath. Loculated bilateral pleural effusions. History of COPD, CHF and end-stage renal disease.  EXAM: CT CHEST WITH CONTRAST  TECHNIQUE: Multidetector CT imaging of the chest was performed during intravenous contrast administration.  CONTRAST:  31mL OMNIPAQUE IOHEXOL 300 MG/ML  SOLN  COMPARISON:  04/02/2013 and prior chest radiographs. 12/27/2012 and prior chest CTs.  FINDINGS: Cardiomegaly, heavy coronary artery calcifications and vascular calcifications are again noted. Calcification along the left ventricular wall again noted.  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  Bilateral loculated pleural effusions are slightly decreased in size since 12/27/2012, moderate-large on the left and small-moderate on the right. Mild pleural enhancement is again identified without definite pleural nodularity or masses.  There is no evidence of pericardial effusion.  Stable enlarged hilar and subcarinal lymph nodes are identified with index nodes as follows:  A 2 cm right hilar node (image 31).  A 2.2 x 2.3 cm subcarinal node (image 26).  Moderate atelectasis within both lower lungs again noted.  A focal area of ground-glass opacity within the anterior right upper lobe may represent pneumonia/pneumonitis.  Bilateral renal atrophy, splenomegaly and chronic changes involving both kidneys again noted.  No acute or suspicious bony abnormalities are identified.  IMPRESSION: Slightly smaller bilateral loculated pleural effusions, moderate-large on the left and small-moderate on the right, with stable apparent pleural enhancement - no pleural nodularity/masses.  Focal right upper lobe ground-glass opacity - question pneumonia/pneumonitis.  Unchanged moderate bilateral lower lung atelectasis, hilar/mediastinal lymphadenopathy, cardiomegaly and coronary artery disease.   Electronically Signed   By: Hassan Rowan M.D.   On: 04/02/2013 13:16   Dg Chest Port 1 View  04/02/2013   CLINICAL DATA:  Cough, COPD and infiltrates.  EXAM: PORTABLE CHEST - 1 VIEW  COMPARISON:  04/01/2013  FINDINGS: Lung volumes are slightly  lower bilaterally compared to the prior exam. There remains evidence of bilateral lower lobe airspace disease suspicious for pneumonia with associated interstitial edema and bilateral pleural effusions. The pleural effusions appear at least partially loculated bilaterally. Component of empyema cannot be excluded. The heart size is stable and mildly enlarged.  IMPRESSION: Lower bilateral lung volumes with persistent bilateral lower  lobe airspace disease, interstitial edema and bilateral loculated pleural fluid. Presence of empyema cannot be excluded.   Electronically Signed   By: Aletta Edouard M.D.   On: 04/02/2013 08:23   Dg Chest Port 1 View  04/01/2013   CLINICAL DATA:  Shortness of breath and cough  EXAM: PORTABLE CHEST - 1 VIEW  COMPARISON:  March 30, 2013  FINDINGS: The heart size and mediastinal contours are stable. The heart size is enlarged. There are bilateral pleural effusions unchanged. Consolidation of the bilateral mid and lung bases are unchanged. There is increased diffuse pulmonary interstitium bilaterally unchanged. The visualized skeletal structures are stable.  IMPRESSION: Persistent interstitial edema unchanged compared to prior exam. Persists in bilateral moderate pleural effusions with consolidation of both lungs unchanged.   Electronically Signed   By: Abelardo Diesel M.D.   On: 04/01/2013 07:44   BMET    Component Value Date/Time   NA 138 04/03/2013 0300   K 3.8 04/03/2013 0300   CL 93* 04/03/2013 0300   CO2 30 04/03/2013 0300   GLUCOSE 119* 04/03/2013 0300   BUN 14 04/03/2013 0300   CREATININE 2.52* 04/03/2013 0300   CALCIUM 9.8 04/03/2013 0300   GFRNONAA 25* 04/03/2013 0300   GFRAA 29* 04/03/2013 0300   CBC    Component Value Date/Time   WBC 5.6 04/03/2013 0300   RBC 4.38 04/03/2013 0300   RBC 3.05* 09/15/2011 1610   HGB 11.2* 04/03/2013 0300   HCT 38.3* 04/03/2013 0300   PLT 215 04/03/2013 0300   MCV 87.4 04/03/2013 0300   MCH 25.6* 04/03/2013 0300   MCHC 29.2* 04/03/2013 0300   RDW 20.9* 04/03/2013 0300   LYMPHSABS 0.8 04/01/2013 0741   MONOABS 0.4 04/01/2013 0741   EOSABS 0.1 04/01/2013 0741   BASOSABS 0.1 04/01/2013 0741     Assessment: 1. SOB combination of intrinsic lung Ds and fluid 2. HTN 3.ESRD 4. Sec HPTH on hectorol, sensipar 5. Mild anemia, not on EPO Plan: 1. HD in AM 2. He needs to stop smoking! 3. Rec rapid  taper of steroids   [name] T

## 2013-04-03 NOTE — Progress Notes (Signed)
[name]  BRONSEN TAJIMA ZRA:[id number] DOB: 18-May-1950 DOA: 04/01/2013 PCP: Rudi Heap, MD  Assessment/Plan: . Bilateral loculated pleural effusion  -CT with contrast shows left greater than right loculated effusions>> discussed with pulmonary and Dr. Craige Cotta to see pt - Noted that CT of his chest in November showed moderate L. pleural effusion .COPD with acute exacerbation  -continue nebulized bronchodilators, Solu-Medrol and mucolytics  -antibiotics to cover for probable HCAP as discussed below  -Continue supplemental oxygen and follow.  . Acute on chronic diastolic CHF +/-HCAP (healthcare-associated pneumonia)  -Dialysis per renal for volume control  -on empiric antibiotics for HCAP for now follow  . Acute on chronic respiratory failure  -Secondary to above see treatment as discussed  . End-stage renal disease on dialysis  -Dialysis per renal  . GERD  Continue PPI  . h/o Hypertension  -No outpatient medications listed on med rec, monitor BPs and treat as appropriate  . History of CVA  . Hypokalemia  -Resolved   Code Status: full Family Communication: None at the bedside Disposition Plan: Transfer to telemetry   Consultants:  renal  Procedures:  none  Antibiotics:  vanc and cefepime started on 1/9  HPI/Subjective: + Productive cough, states breathing about the same  Objective: Filed Vitals:   04/03/13 0800  BP: 144/45  Pulse: 81  Temp:   Resp: 16    Intake/Output Summary (Last 24 hours) at 04/03/13 1041 Last data filed at 04/03/13 0800  Gross per 24 hour  Intake    190 ml  Output   3000 ml  Net  -2810 ml   Filed Weights   04/01/13 1824 04/02/13 1406 04/02/13 1755  Weight: 59.4 kg (130 lb 15.3 oz) 58.9 kg (129 lb 13.6 oz) 55.9 kg (123 lb 3.8 oz)    Exam:  General: alert & oriented x 3 In NAD Cardiovascular: RRR, nl S1 s2 Respiratory: Moderate air movement, no wheezes. Decreased breath sounds at the bases Abdomen: soft  +BS NT/ND, no masses palpable Extremities: No cyanosis and no edema    Data Reviewed: Basic Metabolic Panel:  Recent Labs Lab 03/30/13 1153 04/01/13 0754 04/01/13 1313 04/02/13 0330 04/03/13 0300  NA  --  142  --  141 138  K 3.4* 3.2*  --  4.4 3.8  CL  --  101  --  99 93*  CO2  --   --   --  28 30  GLUCOSE  --  92  --  120* 119*  BUN  --  21  --  13 14  CREATININE  --  4.80*  --  2.92* 2.52*  CALCIUM  --   --   --  9.9 9.8  PHOS  --   --  3.8  --   --    Liver Function Tests: No results found for this basename: AST, ALT, ALKPHOS, BILITOT, PROT, ALBUMIN,  in the last 168 hours No results found for this basename: LIPASE, AMYLASE,  in the last 168 hours No results found for this basename: AMMONIA,  in the last 168 hours CBC:  Recent Labs Lab 04/01/13 0741 04/01/13 0754 04/03/13 0300  WBC 6.0  --  5.6  NEUTROABS 4.6  --   --   HGB 10.9* 13.3 11.2*  HCT 36.0* 39.0 38.3*  MCV 86.5  --  87.4  PLT 137*  --  215   Cardiac Enzymes:  Recent Labs Lab 04/01/13 2030 04/02/13 0200 04/02/13 0735  TROPONINI <0.30 <0.30 <0.30  BNP (last 3 results)  Recent Labs  12/25/12 1300 02/08/13 1903 04/01/13 2030  PROBNP 32295.0* 27711.0* 48968.0*   CBG:  Recent Labs Lab 04/01/13 1149 04/02/13 2210  GLUCAP 73 100*    Recent Results (from the past 240 hour(s))  MRSA PCR SCREENING     Status: None   Collection Time    04/01/13 12:04 PM      Result Value Range Status   MRSA by PCR NEGATIVE  NEGATIVE Final   Comment:            The GeneXpert MRSA Assay (FDA     approved for NASAL specimens     only), is one component of a     comprehensive MRSA colonization     surveillance program. It is not     intended to diagnose MRSA     infection nor to guide or     monitor treatment for     MRSA infections.     Studies: Ct Chest W Contrast  04/02/2013   CLINICAL DATA:  63 year old male with shortness of breath. Loculated bilateral pleural effusions. History of COPD,  CHF and end-stage renal disease.  EXAM: CT CHEST WITH CONTRAST  TECHNIQUE: Multidetector CT imaging of the chest was performed during intravenous contrast administration.  CONTRAST:  68mL OMNIPAQUE IOHEXOL 300 MG/ML  SOLN  COMPARISON:  04/02/2013 and prior chest radiographs. 12/27/2012 and prior chest CTs.  FINDINGS: Cardiomegaly, heavy coronary artery calcifications and vascular calcifications are again noted. Calcification along the left ventricular wall again noted.  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  Bilateral loculated pleural effusions are slightly decreased in size since 12/27/2012, moderate-large on the left and small-moderate on the right. Mild pleural enhancement is again identified without definite pleural nodularity or masses.  There is no evidence of pericardial effusion.  Stable enlarged hilar and subcarinal lymph nodes are identified with index nodes as follows:  A 2 cm right hilar node (image 31).  A 2.2 x 2.3 cm subcarinal node (image 26).  Moderate atelectasis within both lower lungs again noted.  A focal area of ground-glass opacity within the anterior right upper lobe may represent pneumonia/pneumonitis.  Bilateral renal atrophy, splenomegaly and chronic changes involving both kidneys again noted.  No acute or suspicious bony abnormalities are identified.  IMPRESSION: Slightly smaller bilateral loculated pleural effusions, moderate-large on the left and small-moderate on the right, with stable apparent pleural enhancement - no pleural nodularity/masses.  Focal right upper lobe ground-glass opacity - question pneumonia/pneumonitis.  Unchanged moderate bilateral lower lung atelectasis, hilar/mediastinal lymphadenopathy, cardiomegaly and coronary artery disease.   Electronically Signed   By: Hassan Rowan M.D.   On: 04/02/2013 13:16   Dg Chest Port 1 View  04/02/2013   CLINICAL DATA:  Cough, COPD and infiltrates.  EXAM: PORTABLE  CHEST - 1 VIEW  COMPARISON:  04/01/2013  FINDINGS: Lung volumes are slightly lower bilaterally compared to the prior exam. There remains evidence of bilateral lower lobe airspace disease suspicious for pneumonia with associated interstitial edema and bilateral pleural effusions. The pleural effusions appear at least partially loculated bilaterally. Component of empyema cannot be excluded. The heart size is stable and mildly enlarged.  IMPRESSION: Lower bilateral lung volumes with persistent bilateral lower lobe airspace disease, interstitial edema and bilateral loculated pleural fluid. Presence of empyema cannot be excluded.   Electronically Signed   By: Aletta Edouard M.D.   On: 04/02/2013 08:23    Scheduled Meds: .  calcium acetate  2,001 mg Oral TID WC  . ceFEPime (MAXIPIME) IV  2 g Intravenous Q M,W,F-HD  . cinacalcet  30 mg Oral Q breakfast  . [START ON 04/04/2013] doxercalciferol  2.5 mcg Intravenous Q M,W,F-HD  . DULoxetine  60 mg Oral Daily  . enoxaparin (LOVENOX) injection  30 mg Subcutaneous Q24H  . feeding supplement (NEPRO CARB STEADY)  237 mL Oral BID BM  . folic acid  1 mg Oral Daily  . gabapentin  300 mg Oral TID  . guaiFENesin  600 mg Oral BID  . ipratropium  0.5 mg Nebulization TID  . methylPREDNISolone (SOLU-MEDROL) injection  40 mg Intravenous Q12H  . multivitamin  1 tablet Oral QHS  . pantoprazole  40 mg Oral Daily  . sodium chloride  3 mL Intravenous Q12H  . [START ON 04/04/2013] vancomycin  500 mg Intravenous Q M,W,F-HD   Continuous Infusions:   Active Problems:   ESRD on hemodialysis   Acute diastolic heart failure   COPD with acute exacerbation   HCAP (healthcare-associated pneumonia)    Time spent: [facility] Pager [phone]. If 7PM-7AM, please contact night-coverage at [email or web address], password [facility] 04/03/2013, 10:41 AM  LOS: 2 days

## 2013-04-03 NOTE — Progress Notes (Signed)
ANTIBIOTIC CONSULT NOTE - INITIAL  Pharmacy Consult for Levofloxacin Indication: Setting of COPD Exacerbation  Allergies  Allergen Reactions  . Ambien [Zolpidem Tartrate] Other (See Comments)    Hallucinations    Patient Measurements: Height: 5\' 6"  (167.6 cm) Weight: 123 lb 3.8 oz (55.9 kg) IBW/kg (Calculated) : 63.8  Vital Signs: Temp: 97.9 F (36.6 C) (01/11 0743) Temp src: Oral (01/11 0743) BP: 144/45 mmHg (01/11 0800) Pulse Rate: 81 (01/11 0800) Intake/Output from previous day: 01/10 0701 - 01/11 0700 In: 417 [P.O.:180; NG/GT:237] Out: 3000  Intake/Output from this shift: Total I/O In: 10 [I.V.:10] Out: -   Labs:  Recent Labs  04/01/13 0741 04/01/13 0754 04/02/13 0330 04/03/13 0300  WBC 6.0  --   --  5.6  HGB 10.9* 13.3  --  11.2*  PLT 137*  --   --  215  CREATININE  --  4.80* 2.92* 2.52*   Estimated Creatinine Clearance: 23.4 ml/min (by C-G formula based on Cr of 2.52). No results found for this basename: VANCOTROUGH, Corlis Leak, VANCORANDOM, GENTTROUGH, GENTPEAK, GENTRANDOM, TOBRATROUGH, TOBRAPEAK, TOBRARND, AMIKACINPEAK, AMIKACINTROU, AMIKACIN,  in the last 72 hours   Microbiology: Recent Results (from the past 720 hour(s))  MRSA PCR SCREENING     Status: None   Collection Time    04/01/13 12:04 PM      Result Value Range Status   MRSA by PCR NEGATIVE  NEGATIVE Final   Comment:            The GeneXpert MRSA Assay (FDA     approved for NASAL specimens     only), is one component of a     comprehensive MRSA colonization     surveillance program. It is not     intended to diagnose MRSA     infection nor to guide or     monitor treatment for     MRSA infections.    Medical History: Past Medical History  Diagnosis Date  . Hyperlipidemia   . Arthritis   . Claudication   . Anemia   . Chronic diastolic CHF (congestive heart failure) 07/2010    a. 07/2008 Echo EF 50-55%, mild-mod LVH, Gr 1 DD.  Marland Kitchen Peripheral vascular occlusive disease     a.  07/2011 Periph Angio: No signif Ao-illiac dzs, LCF stenosis, 100% LSFA w recon above knee pop and 3 vessel runoff, 100% RSFA w/ recon above knee --> pending L Fem to below Knee pop bypass.  Marland Kitchen History of tobacco abuse   . Hypertension     takes Amlodipine,Metoprolol,and Prinivil daily  . Dyspnea on exertion     with exertion  . Cataract     bilateral  . Dialysis patient   . PUD (peptic ulcer disease) mid 1990's    with GI bleed.  endoscoped in mid 1990's at [facility]  . Stroke 2005    no residual  . Asthma     08/2012  . GERD (gastroesophageal reflux disease)   . History of blood transfusion     peptic ulcer  . ESRD (end stage renal disease)     a. MWF dialysis in [place] (followed by Dr. Lowanda Foster)  . COPD (chronic obstructive pulmonary disease) 04/01/2013    Assessment: 63 yo male with ESRD on HD previously on vancomycin and cefepime to r/o PNA. Antibiotics have been changed to levofloxacin in the setting of COPD exacerbation. Pharmacy has been consulted to dose levofloxacin. Patient has been afebrile and WBC normal. No cultures have been obtained.  Vanc 1/9 >> 1/11 Cefepime 1/9 >> 1/11 Levofloxacin 1/11 >>  Goal of Therapy:  Eradication of infection  Plan:  - Levofloxacin 750mg  PO x 1, then 500mg  PO q48h - Monitor renal function, cultures, WBC, and Tmax  Marinus Eicher A. Pincus Badder, PharmD Clinical Pharmacist - Resident Pager: [phone] Pharmacy: [phone] 04/03/2013 1:20 PM

## 2013-04-03 NOTE — Consult Note (Addendum)
PULMONARY / CRITICAL CARE MEDICINE  Name: [name] MRN: [id number] DOB: [birth year]    ADMISSION DATE:  04/01/2013 CONSULTATION DATE: 04/03/2013  REFERRING MD :  [facility] PRIMARY SERVICE:  TRH  CHIEF COMPLAINT:  Dyspnea  BRIEF PATIENT DESCRIPTION: 63 yo with oxygen dependent COPD and chronic diastolic CHF  And ESRD on HD admitted with COPD / CHF exacerbation and bilateral pleural effusions.  PCCM was asked to assist in management.  SIGNIFICANT EVENTS / STUDIES:  1/10  Chest CT >>>  Bilateral effusions, RUL GGO, hilar / mediastinal lymphadenopathy   LINE / TUBES:  CULTURES:  ANTIBIOTICS: Cefepime 1/9 >>> 1/11 Vancomycin 1/9 >>> 1/11 Levaquin 1/11 >>>  HISTORY OF PRESENT ILLNESS:  63 yo with oxygen dependent COPD and chronic diastolic CHF and ESRD on HD presented with 3 days of increased dyspnea and cough productive of brownish sputum.  He denied chest pain, fevers, chills, frank hemoptysis or pedal edema.  Imaging revealed bilateral pleural effusions.  PAST MEDICAL HISTORY :  Past Medical History  Diagnosis Date  . Hyperlipidemia   . Arthritis   . Claudication   . Anemia   . Chronic diastolic CHF (congestive heart failure) 07/2010    a. 07/2008 Echo EF 50-55%, mild-mod LVH, Gr 1 DD.  Marland Kitchen Peripheral vascular occlusive disease     a. 07/2011 Periph Angio: No signif Ao-illiac dzs, LCF stenosis, 100% LSFA w recon above knee pop and 3 vessel runoff, 100% RSFA w/ recon above knee --> pending L Fem to below Knee pop bypass.  Marland Kitchen History of tobacco abuse   . Hypertension     takes Amlodipine,Metoprolol,and Prinivil daily  . Dyspnea on exertion     with exertion  . Cataract     bilateral  . Dialysis patient   . PUD (peptic ulcer disease) mid 1990's    with GI bleed.  endoscoped in mid 1990's at [facility]  . Stroke 2005    no residual  . Asthma     08/2012  . GERD (gastroesophageal reflux disease)   . History of blood transfusion     peptic ulcer  . ESRD (end stage renal  disease)     a. MWF dialysis in [place] (followed by Dr. Lowanda Foster)  . COPD (chronic obstructive pulmonary disease) 04/01/2013   Past Surgical History  Procedure Laterality Date  . Arteriovenous graft placement    . Av fistula placement  12/10/2000    Right brachiocephalic arteriovenous   . Aortagram  07/29/2011    Abdominal Aortagram  . Cardiac catheterization  08/01/11    Left heart catheterization  . Femoral-popliteal bypass graft  09/09/2011    Procedure: BYPASS GRAFT FEMORAL-POPLITEAL ARTERY;  Surgeon: Serafina Mitchell, MD;  Location: [place];  Service: Vascular;  Laterality: Left;  . Amputation  09/12/2011    Procedure: AMPUTATION BELOW KNEE;  Surgeon: Serafina Mitchell, MD;  Location: [facility] OR;  Service: Vascular;  Laterality: Left;  TRANSMETATARSAL  . Application of wound vac  10/30/2011    Procedure: APPLICATION OF WOUND VAC;  Surgeon: Serafina Mitchell, MD;  Location: [place];  Service: Vascular;  Laterality: Left;  . Groin debridement  11/01/2011    Procedure: Virl Son DEBRIDEMENT;  Surgeon: Angelia Mould, MD;  Location: [facility] OR;  Service: Vascular;  Laterality: Left;  . Esophagogastroduodenoscopy  11/06/2011    Procedure: ESOPHAGOGASTRODUODENOSCOPY (EGD);  Surgeon: Jerene Bears, MD;  Location: [place];  Service: Gastroenterology;  Laterality: N/A;  . Femoral-popliteal bypass graft  01/02/2012  Procedure: BYPASS GRAFT FEMORAL-POPLITEAL ARTERY;  Surgeon: Serafina Mitchell, MD;  Location: [place];  Service: Vascular;  Laterality: Right;  . Amputation  01/02/2012    Procedure: AMPUTATION RAY;  Surgeon: Serafina Mitchell, MD;  Location: [facility] OR;  Service: Vascular;  Laterality: Right;  . Toe amputation Right     5/13- great toe  . Exchange of a dialysis catheter N/A 07/15/2012    Procedure: EXCHANGE OF A DIALYSIS CATHETER;  Surgeon: Conrad Calcutta, MD;  Location: [place];  Service: Vascular;  Laterality: N/A;  . Av fistula placement Left 07/15/2012    Procedure: ARTERIOVENOUS (AV) FISTULA CREATION;  Surgeon:  Conrad Twilight, MD;  Location: [place];  Service: Vascular;  Laterality: Left;  . Av fistula placement Left 09/07/2012    Procedure: INSERTION OF ARTERIOVENOUS (AV) GORE-TEX GRAFT ARM;  Surgeon: Conrad Youngwood, MD;  Location: [place];  Service: Vascular;  Laterality: Left;  . Insertion of dialysis catheter Right 09/07/2012    Procedure: INSERTION OF a Femoral DIALYSIS CATHETER;  Surgeon: Conrad Hamilton, MD;  Location: [place];  Service: Vascular;  Laterality: Right;  . Lipoma excision Left 09/07/2012    Procedure: EXCISION Seroma left arm;  Surgeon: Conrad Belleair, MD;  Location: [place];  Service: Vascular;  Laterality: Left;  . Hernia repair      umbilical  . Thrombectomy and revision of arterioventous (av) goretex  graft Left 11/30/2012    Procedure: THROMBECTOMY AND REVISION OF ARTERIOVENTOUS (AV) GORETEX  GRAFT;  Surgeon: Angelia Mould, MD;  Location: [place];  Service: Vascular;  Laterality: Left;   Prior to Admission medications   Medication Sig Start Date End Date Taking? Authorizing Provider  ALPRAZolam Duanne Moron) 1 MG tablet Take 1 tablet (1 mg total) by mouth 2 (two) times daily as needed for anxiety or sleep. 03/03/13  Yes Erby Pian, FNP  calcium acetate (PHOSLO) 667 MG capsule Take 1,334 mg by mouth 3 (three) times daily with meals.    Yes Historical Provider, MD  DULoxetine (CYMBALTA) 60 MG capsule Take 60 mg by mouth daily.   Yes Historical Provider, MD  folic acid (FOLVITE) 1 MG tablet Take 1 mg by mouth daily.   Yes Historical Provider, MD  gabapentin (NEURONTIN) 300 MG capsule Take 1 capsule (300 mg total) by mouth 3 (three) times daily. 12/07/12  Yes [name], FNP  multivitamin (RENA-VIT) TABS tablet Take 1 tablet by mouth daily.   Yes Historical Provider, MD  omeprazole (PRILOSEC) 20 MG capsule Take 20 mg by mouth daily.   Yes Historical Provider, MD  oxyCODONE-acetaminophen (PERCOCET) 7.5-325 MG per tablet Take 1 tablet by mouth every 8 (eight) hours as needed for pain. 11/30/12   Yes Samantha J Rhyne, PA-C  PROAIR HFA 108 (90 BASE) MCG/ACT inhaler Inhale 1 puff into the lungs every 4 (four) hours as needed for wheezing.  10/22/12  Yes Historical Provider, MD  tiZANidine (ZANAFLEX) 4 MG tablet Take 4 mg by mouth every 6 (six) hours as needed for muscle spasms.  12/21/12  Yes Historical Provider, MD   Allergies  Allergen Reactions  . Ambien [Zolpidem Tartrate] Other (See Comments)    Hallucinations   FAMILY HISTORY:  Family History  Problem Relation Age of Onset  . Breast cancer Mother   . Cancer Mother   . Cancer Father   . Anesthesia problems Neg Hx   . Hypotension Neg Hx   . Malignant hyperthermia Neg Hx   . Pseudochol  deficiency Neg Hx    SOCIAL HISTORY:  reports that he has been smoking Cigarettes.  He has a 20 pack-year smoking history. He has never used smokeless tobacco. He reports that he does not drink alcohol or use illicit drugs.  REVIEW OF SYSTEMS:   Constitutional: Negative for fever, chills, weight loss, malaise/fatigue and diaphoresis.  HENT: Negative for hearing loss, ear pain, nosebleeds, congestion, sore throat, neck pain, tinnitus and ear discharge.   Eyes: Negative for blurred vision, double vision, photophobia, pain, discharge and redness.  Respiratory: Negative for hemoptysis, wheezing and stridor.  Positive for dyspnea and cough with brownish sputum. Cardiovascular: Negative for chest pain, palpitations, orthopnea, claudication, leg swelling and PND.  Gastrointestinal: Negative for heartburn, nausea, vomiting, abdominal pain, diarrhea, constipation, blood in stool and melena.  Genitourinary: Negative for dysuria, urgency, frequency, hematuria and flank pain.  Musculoskeletal: Negative for myalgias, back pain, joint pain and falls.  Skin: Negative for itching and rash.  Neurological: Negative for dizziness, tingling, tremors, sensory change, speech change, focal weakness, seizures, loss of consciousness, weakness and headaches.   Endo/Heme/Allergies: Negative for environmental allergies and polydipsia. Does not bruise/bleed easily.  SUBJECTIVE:   VITAL SIGNS: Temp:  [97.9 F (36.6 C)-98.4 F (36.9 C)] 97.9 F (36.6 C) (01/11 0743) Pulse Rate:  [26-95] 81 (01/11 0800) Resp:  [12-23] 16 (01/11 0800) BP: (111-203)/(25-135) 144/45 mmHg (01/11 0800) SpO2:  [89 %-100 %] 97 % (01/11 0831) Weight:  [55.9 kg (123 lb 3.8 oz)-58.9 kg (129 lb 13.6 oz)] 55.9 kg (123 lb 3.8 oz) (01/10 1755)  PHYSICAL EXAMINATION: General:  Resting comfortably, appears to be in no acute distress Neuro:  Awake, alert, cooperative with examination, non-focal HEENT:  NCAT, PERRL, moist membranes Neck:  Soft, no bruits, no lymphadenopathy, no carotid btuits Cardiovascular:  RRR, no m/r/g Lungs:  Bilateral air entry, no w/r/r Abdomen:  Soft, nontender, bowel sounds present, no organomegaly Musculoskeletal:  No edema, no digital clubbing, amputated toes LLE Skin:  Intact, no rash   Recent Labs Lab 04/01/13 0754 04/02/13 0330 04/03/13 0300  NA 142 141 138  K 3.2* 4.4 3.8  CL 101 99 93*  CO2  --  28 30  BUN 21 13 14   CREATININE 4.80* 2.92* 2.52*  GLUCOSE 92 120* 119*    Recent Labs Lab 04/01/13 0741 04/01/13 0754 04/03/13 0300  HGB 10.9* 13.3 11.2*  HCT 36.0* 39.0 38.3*  WBC 6.0  --  5.6  PLT 137*  --  215   CXR:  1/10 >>>  Lower bilateral lung volumes with persistent bilateral lower lobe airspace disease, interstitial edema and bilateral loculated pleural fluid. Presence of empyema cannot be excluded.  ASSESSMENT / PLAN:  Acute on chronic respiratory failure Acute on chronic diastolic congestive heart failure Acute pulmonary edema COPD, oxygen dependent, with possible exacerbation Pneumonia unlikely Bilateral pleural effusions, likely secondary to congestive heart failure Mediastinal / hilar lymphadenopathy, not uncommon finding in setting of acute congestive heart failure RUL GGO ESRD on HD  -->  Supplemental  oxygen for goal SpO2 > 92 -->  Bronchodilators changed to Albuterol / Atrovent QID + Albuterol q2h PRN -->  D/c Solu-Medrol -->  Start Prednisone 40 daily -->  Send PCT -->  D/c Cefepime / Vancomycin -->  Start Levaquin in setting of COPD exacerbation -->  HD per renal with goal negative fluid balance -->  Echo ordered -->  No indications for thoracentesis -->  May need re imaging at some point -->  Would limit opioids /  sedatives / hypnotics  -->  Appreciate the consult, will follow  I have personally obtained history, examined patient, evaluated and interpreted laboratory and imaging results, reviewed medical records, formulated assessment / plan and placed orders.  Doree Fudge, MD Pulmonary and [place] Pager: [phone]  04/03/2013, 11:52 AM

## 2013-04-04 DIAGNOSIS — R002 Palpitations: Secondary | ICD-10-CM

## 2013-04-04 DIAGNOSIS — I517 Cardiomegaly: Secondary | ICD-10-CM

## 2013-04-04 LAB — PROCALCITONIN: Procalcitonin: 2.02 ng/mL

## 2013-04-04 MED ORDER — ASPIRIN 325 MG PO TABS: 325.0000 mg | ORAL_TABLET | Freq: Once | ORAL | Status: AC

## 2013-04-04 MED ORDER — DOXERCALCIFEROL 4 MCG/2ML IV SOLN
INTRAVENOUS | Status: AC
  Filled 2013-04-04: qty 2

## 2013-04-04 MED ORDER — ONDANSETRON HCL 4 MG/2ML IJ SOLN
INTRAMUSCULAR | Status: AC
  Filled 2013-04-04: qty 2

## 2013-04-04 NOTE — Progress Notes (Signed)
[facility] PROGRESS NOTE  [name] [id number] DOB: October 16, 1950 DOA: 04/01/2013 PCP: Redge Gainer, MD  Assessment/Plan: . Bilateral loculated pleural effusion  -CT with contrast shows left greater than right loculated effusions - Noted that CT of his chest in November showed moderate L. pleural effusion -Per pulmonary effusions likely secondary to CHF and 2-D echo was ordered, follow. Marland KitchenCOPD with acute exacerbation  -continue nebulized bronchodilators, changed to prednisone and mucolytics  -Continue Levaquin -Continue supplemental oxygen and follow.  -Appreciate pulmonary input . Acute on chronic diastolic CHF +/-HCAP (healthcare-associated pneumonia)  -Dialysis per renal for volume control  -Await echo -on empiric antibiotics for HCAP changed to Levaquin per pulmonary . Acute on chronic respiratory failure  -Secondary to above see treatment as discussed  . End-stage renal disease on dialysis  -Dialysis per renal  . GERD  Continue PPI  . h/o Hypertension  -No outpatient medications listed on med rec, monitor BPs and treat as appropriate  . History of CVA  . Hypokalemia  -Resolved   Code Status: full Family Communication: None at the bedside Disposition Plan: Transfer to telemetry   Consultants:  renal  Procedures:  none  Antibiotics:  vanc and cefepime started on 1/9>>1/11  Levaquin started on 1/11  HPI/Subjective: States cough better,and breathing better.  Objective: Filed Vitals:   04/04/13 0737  BP:   Pulse:   Temp: 98.2 F (36.8 C)  Resp:     Intake/Output Summary (Last 24 hours) at 04/04/13 0905 Last data filed at 04/03/13 1800  Gross per 24 hour  Intake    820 ml  Output      0 ml  Net    820 ml   Filed Weights   04/01/13 1824 04/02/13 1406 04/02/13 1755  Weight: 59.4 kg (130 lb 15.3 oz) 58.9 kg (129 lb 13.6 oz) 55.9 kg (123 lb 3.8 oz)    Exam:  General: alert & oriented x 3 In NAD Cardiovascular: RRR, nl S1  s2 Respiratory: Moderate air movement, no wheezes. Decreased breath sounds at the bases Abdomen: soft +BS NT/ND, no masses palpable Extremities: No cyanosis and no edema    Data Reviewed: Basic Metabolic Panel:  Recent Labs Lab 03/30/13 1153 04/01/13 0754 04/01/13 1313 04/02/13 0330 04/03/13 0300  NA  --  142  --  141 138  K 3.4* 3.2*  --  4.4 3.8  CL  --  101  --  99 93*  CO2  --   --   --  28 30  GLUCOSE  --  92  --  120* 119*  BUN  --  21  --  13 14  CREATININE  --  4.80*  --  2.92* 2.52*  CALCIUM  --   --   --  9.9 9.8  PHOS  --   --  3.8  --   --    Liver Function Tests: No results found for this basename: AST, ALT, ALKPHOS, BILITOT, PROT, ALBUMIN,  in the last 168 hours No results found for this basename: LIPASE, AMYLASE,  in the last 168 hours No results found for this basename: AMMONIA,  in the last 168 hours CBC:  Recent Labs Lab 04/01/13 0741 04/01/13 0754 04/03/13 0300  WBC 6.0  --  5.6  NEUTROABS 4.6  --   --   HGB 10.9* 13.3 11.2*  HCT 36.0* 39.0 38.3*  MCV 86.5  --  87.4  PLT 137*  --  215   Cardiac Enzymes:  Recent Labs  Lab 04/01/13 2030 04/02/13 0200 04/02/13 0735  TROPONINI <0.30 <0.30 <0.30   BNP (last 3 results)  Recent Labs  12/25/12 1300 02/08/13 1903 04/01/13 2030  PROBNP 32295.0* 27711.0* 48968.0*   CBG:  Recent Labs Lab 04/01/13 1149 04/02/13 2210  GLUCAP 73 100*    Recent Results (from the past 240 hour(s))  MRSA PCR SCREENING     Status: None   Collection Time    04/01/13 12:04 PM      Result Value Range Status   MRSA by PCR NEGATIVE  NEGATIVE Final   Comment:            The GeneXpert MRSA Assay (FDA     approved for NASAL specimens     only), is one component of a     comprehensive MRSA colonization     surveillance program. It is not     intended to diagnose MRSA     infection nor to guide or     monitor treatment for     MRSA infections.     Studies: Ct Chest W Contrast  04/02/2013   CLINICAL  DATA:  63 year old male with shortness of breath. Loculated bilateral pleural effusions. History of COPD, CHF and end-stage renal disease.  EXAM: CT CHEST WITH CONTRAST  TECHNIQUE: Multidetector CT imaging of the chest was performed during intravenous contrast administration.  CONTRAST:  43mL OMNIPAQUE IOHEXOL 300 MG/ML  SOLN  COMPARISON:  04/02/2013 and prior chest radiographs. 12/27/2012 and prior chest CTs.  FINDINGS: Cardiomegaly, heavy coronary artery calcifications and vascular calcifications are again noted. Calcification along the left ventricular wall again noted.  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  Bilateral loculated pleural effusions are slightly decreased in size since 12/27/2012, moderate-large on the left and small-moderate on the right. Mild pleural enhancement is again identified without definite pleural nodularity or masses.  There is no evidence of pericardial effusion.  Stable enlarged hilar and subcarinal lymph nodes are identified with index nodes as follows:  A 2 cm right hilar node (image 31).  A 2.2 x 2.3 cm subcarinal node (image 26).  Moderate atelectasis within both lower lungs again noted.  A focal area of ground-glass opacity within the anterior right upper lobe may represent pneumonia/pneumonitis.  Bilateral renal atrophy, splenomegaly and chronic changes involving both kidneys again noted.  No acute or suspicious bony abnormalities are identified.  IMPRESSION: Slightly smaller bilateral loculated pleural effusions, moderate-large on the left and small-moderate on the right, with stable apparent pleural enhancement - no pleural nodularity/masses.  Focal right upper lobe ground-glass opacity - question pneumonia/pneumonitis.  Unchanged moderate bilateral lower lung atelectasis, hilar/mediastinal lymphadenopathy, cardiomegaly and coronary artery disease.   Electronically Signed   By: Hassan Rowan M.D.   On: 04/02/2013  13:16    Scheduled Meds: . calcium acetate  2,001 mg Oral TID WC  . cinacalcet  30 mg Oral Q breakfast  . doxercalciferol  2.5 mcg Intravenous Q M,W,F-HD  . DULoxetine  60 mg Oral Daily  . enoxaparin (LOVENOX) injection  30 mg Subcutaneous Q24H  . feeding supplement (NEPRO CARB STEADY)  237 mL Oral BID BM  . folic acid  1 mg Oral Daily  . gabapentin  300 mg Oral TID  . guaiFENesin  600 mg Oral BID  . ipratropium-albuterol  3 mL Nebulization QID  . [START ON 04/05/2013] levofloxacin  500 mg Oral Q48H  . multivitamin  1 tablet Oral QHS  . pantoprazole  40 mg Oral Daily  . predniSONE  60 mg Oral Q breakfast  . sodium chloride  3 mL Intravenous Q12H   Continuous Infusions:   Active Problems:   ESRD on hemodialysis   Acute diastolic heart failure   COPD with acute exacerbation   HCAP (healthcare-associated pneumonia)   Pulmonary edema   Acute on chronic diastolic congestive heart failure   Acute-on-chronic respiratory failure   Mediastinal lymphadenopathy    Time spent: [place] [facility] Pager [phone]. If 7PM-7AM, please contact night-coverage at [email or web address], password [facility] 04/04/2013, 9:05 AM  LOS: 3 days

## 2013-04-04 NOTE — Progress Notes (Signed)
  Echocardiogram 2D Echocardiogram has been performed.  [name] 04/04/2013, 1:09 PM

## 2013-04-04 NOTE — Progress Notes (Signed)
[facility] ROUNDING NOTE   Subjective:   Interval History: breathing improved  Objective:  Vital signs in last 24 hours:  Temp:  [98.1 F (36.7 C)-98.6 F (37 C)] 98.2 F (36.8 C) (01/12 0737) Pulse Rate:  [47-109] 66 (01/12 0700) Resp:  [15-26] 15 (01/12 0700) BP: (112-187)/(14-103) 159/42 mmHg (01/12 0700) SpO2:  [85 %-100 %] 98 % (01/12 0923)  Weight change:  Filed Weights   04/01/13 1824 04/02/13 1406 04/02/13 1755  Weight: 59.4 kg (130 lb 15.3 oz) 58.9 kg (129 lb 13.6 oz) 55.9 kg (123 lb 3.8 oz)    Intake/Output: I/O last 3 completed shifts: In: 890 [P.O.:880; I.V.:10] Out: -    Intake/Output this shift:     CVS:RRR  Resp:decreased BS bases R>l. Wheezing better  Abd:+ BS NTND  Ext: No edema L AVG + bruit  NEURO:CNI Ox3 no asterixis    Basic Metabolic Panel:  Recent Labs Lab 03/30/13 1153 04/01/13 0754 04/01/13 1313 04/02/13 0330 04/03/13 0300  NA  --  142  --  141 138  K 3.4* 3.2*  --  4.4 3.8  CL  --  101  --  99 93*  CO2  --   --   --  28 30  GLUCOSE  --  92  --  120* 119*  BUN  --  21  --  13 14  CREATININE  --  4.80*  --  2.92* 2.52*  CALCIUM  --   --   --  9.9 9.8  PHOS  --   --  3.8  --   --     Liver Function Tests: No results found for this basename: AST, ALT, ALKPHOS, BILITOT, PROT, ALBUMIN,  in the last 168 hours No results found for this basename: LIPASE, AMYLASE,  in the last 168 hours No results found for this basename: AMMONIA,  in the last 168 hours  CBC:  Recent Labs Lab 04/01/13 0741 04/01/13 0754 04/03/13 0300  WBC 6.0  --  5.6  NEUTROABS 4.6  --   --   HGB 10.9* 13.3 11.2*  HCT 36.0* 39.0 38.3*  MCV 86.5  --  87.4  PLT 137*  --  215    Cardiac Enzymes:  Recent Labs Lab 04/01/13 2030 04/02/13 0200 04/02/13 0735  TROPONINI <0.30 <0.30 <0.30    BNP: No components found with this basename: POCBNP,   CBG:  Recent Labs Lab 04/01/13 1149 04/02/13 2210  GLUCAP 73 100*     Microbiology: Results for orders placed during the hospital encounter of 04/01/13  MRSA PCR SCREENING     Status: None   Collection Time    04/01/13 12:04 PM      Result Value Range Status   MRSA by PCR NEGATIVE  NEGATIVE Final   Comment:            The GeneXpert MRSA Assay (FDA     approved for NASAL specimens     only), is one component of a     comprehensive MRSA colonization     surveillance program. It is not     intended to diagnose MRSA     infection nor to guide or     monitor treatment for     MRSA infections.    Coagulation Studies: No results found for this basename: LABPROT, INR,  in the last 72 hours  Urinalysis: No results found for this basename: COLORURINE, APPERANCEUR, LABSPEC, [place], GLUCOSEU, [place], [place], [place], PROTEINUR, [place], NITRITE, LEUKOCYTESUR,  in the last 72 hours    Imaging: Ct Chest W Contrast  04/02/2013   CLINICAL DATA:  63 year old male with shortness of breath. Loculated bilateral pleural effusions. History of COPD, CHF and end-stage renal disease.  EXAM: CT CHEST WITH CONTRAST  TECHNIQUE: Multidetector CT imaging of the chest was performed during intravenous contrast administration.  CONTRAST:  24mL OMNIPAQUE IOHEXOL 300 MG/ML  SOLN  COMPARISON:  04/02/2013 and prior chest radiographs. 12/27/2012 and prior chest CTs.  FINDINGS: Cardiomegaly, heavy coronary artery calcifications and vascular calcifications are again noted. Calcification along the left ventricular wall again noted.  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  Bilateral loculated pleural effusions are slightly decreased in size since 12/27/2012, moderate-large on the left and small-moderate on the right. Mild pleural enhancement is again identified without definite pleural nodularity or masses.  There is no evidence of pericardial effusion.  Stable enlarged hilar and subcarinal lymph nodes are identified  with index nodes as follows:  A 2 cm right hilar node (image 31).  A 2.2 x 2.3 cm subcarinal node (image 26).  Moderate atelectasis within both lower lungs again noted.  A focal area of ground-glass opacity within the anterior right upper lobe may represent pneumonia/pneumonitis.  Bilateral renal atrophy, splenomegaly and chronic changes involving both kidneys again noted.  No acute or suspicious bony abnormalities are identified.  IMPRESSION: Slightly smaller bilateral loculated pleural effusions, moderate-large on the left and small-moderate on the right, with stable apparent pleural enhancement - no pleural nodularity/masses.  Focal right upper lobe ground-glass opacity - question pneumonia/pneumonitis.  Unchanged moderate bilateral lower lung atelectasis, hilar/mediastinal lymphadenopathy, cardiomegaly and coronary artery disease.   Electronically Signed   By: Hassan Rowan M.D.   On: 04/02/2013 13:16     Medications:     . calcium acetate  2,001 mg Oral TID WC  . cinacalcet  30 mg Oral Q breakfast  . doxercalciferol  2.5 mcg Intravenous Q M,W,F-HD  . DULoxetine  60 mg Oral Daily  . enoxaparin (LOVENOX) injection  30 mg Subcutaneous Q24H  . feeding supplement (NEPRO CARB STEADY)  237 mL Oral BID BM  . folic acid  1 mg Oral Daily  . gabapentin  300 mg Oral TID  . guaiFENesin  600 mg Oral BID  . ipratropium-albuterol  3 mL Nebulization QID  . [START ON 04/05/2013] levofloxacin  500 mg Oral Q48H  . multivitamin  1 tablet Oral QHS  . pantoprazole  40 mg Oral Daily  . predniSONE  60 mg Oral Q breakfast  . sodium chloride  3 mL Intravenous Q12H   sodium chloride, acetaminophen, acetaminophen, albuterol, ALPRAZolam, ondansetron (ZOFRAN) IV, ondansetron, oxyCODONE, oxyCODONE-acetaminophen, sodium chloride, tiZANidine  Assessment/ Plan:  1.Dyspnea   Intrinsic lung Ds and pulmonary edema 2. HTN  controlled 3.ESRD MWF [name] 4. 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 Mild anemia, not on EPO      LOS: 3 [place] W @TODAY @10 :47 AM

## 2013-04-04 NOTE — Progress Notes (Signed)
PULMONARY / CRITICAL CARE MEDICINE  Name: [name] MRN: [id number] DOB: 23-Jul-1950    ADMISSION DATE:  04/01/2013 CONSULTATION DATE: 04/03/2013  REFERRING MD :  [facility] PRIMARY SERVICE:  [facility]  CHIEF COMPLAINT:  Dyspnea  BRIEF PATIENT DESCRIPTION: 63 yo with oxygen dependent COPD and chronic diastolic CHF  And ESRD on HD admitted with COPD / CHF exacerbation and bilateral pleural effusions.  PCCM was asked to assist in management.  SIGNIFICANT EVENTS / STUDIES:  1/10  Chest CT >>>  Bilateral effusions, RUL GGO, hilar / mediastinal lymphadenopathy   LINE / TUBES:  CULTURES:  ANTIBIOTICS: Cefepime 1/9 >>> 1/11 Vancomycin 1/9 >>> 1/11 Levaquin 1/11 >>>   SUBJECTIVE:  NAD. No new complaints  VITAL SIGNS: Temp:  [97.7 F (36.5 C)-98.6 F (37 C)] 98 F (36.7 C) (01/12 1617) Pulse Rate:  [34-109] 34 (01/12 0800) Resp:  [15-26] 21 (01/12 0800) BP: (112-187)/(14-103) 129/58 mmHg (01/12 1617) SpO2:  [44 %-100 %] 95 % (01/12 1617)  PHYSICAL EXAMINATION: General: NAD Neuro: non-focal HEENT:  WNL Cardiovascular:  RRR, no m/r/g Lungs: No wheezes, diminished in B bases Abdomen:  Soft, NT, NABS Ext:  No edema, no digital clubbing, amputated toes LLE  I have reviewed all of today's lab results. Relevant abnormalities are discussed in the A/P section  CXR:  NNF  ASSESSMENT / PLAN:  Acute on chronic respiratory failure Acute on chronic diastolic congestive heart failure Acute pulmonary edema COPD, oxygen dependent, with possible exacerbation Bilateral chronic pleural effusions - partially loculated ESRD on HD   Cont current Rx Taper steroids as tolerated Complete 5-7 days abx for AECOPD DC home on baseline regimen PCCM will sign off. Please call if we can be of further assistance    Merton Border, MD Pulmonary and [place] Pager: [phone]  04/04/2013, 6:08 PM

## 2013-04-05 ENCOUNTER — Ambulatory Visit: Payer: Medicare Other | Admitting: General Practice

## 2013-04-05 ENCOUNTER — Encounter: Payer: Self-pay | Admitting: Cardiology

## 2013-04-05 ENCOUNTER — Inpatient Hospital Stay (HOSPITAL_COMMUNITY): Payer: Medicare Other

## 2013-04-05 DIAGNOSIS — I959 Hypotension, unspecified: Secondary | ICD-10-CM

## 2013-04-05 LAB — BASIC METABOLIC PANEL
BUN: 25 mg/dL — ABNORMAL HIGH (ref 6–23)
CO2: 31 mEq/L (ref 19–32)
Calcium: 9.9 mg/dL (ref 8.4–10.5)
Chloride: 93 mEq/L — ABNORMAL LOW (ref 96–112)
Creatinine, Ser: 3.93 mg/dL — ABNORMAL HIGH (ref 0.50–1.35)
GFR calc Af Amer: 17 mL/min — ABNORMAL LOW (ref >90)
GFR, EST NON AFRICAN AMERICAN: 15 mL/min — AB (ref >90)
Glucose, Bld: 125 mg/dL — ABNORMAL HIGH (ref 70–99)
POTASSIUM: 3.7 meq/L (ref 3.7–5.3)
SODIUM: 138 meq/L (ref 137–147)

## 2013-04-05 LAB — TROPONIN I: Troponin I: <0.3 ng/mL (ref <0.30)

## 2013-04-05 LAB — GLUCOSE, CAPILLARY
GLUCOSE-CAPILLARY: 77 mg/dL (ref 70–99)
GLUCOSE-CAPILLARY: 138 mg/dL — AB (ref 70–99)
GLUCOSE-CAPILLARY: 111 mg/dL — AB (ref 70–99)
Glucose-Capillary: 99 mg/dL (ref 70–99)

## 2013-04-05 LAB — PROCALCITONIN: PROCALCITONIN: 1.59 ng/mL

## 2013-04-05 LAB — LIPASE, BLOOD: LIPASE: 20 U/L (ref 11–59)

## 2013-04-05 LAB — MAGNESIUM: MAGNESIUM: 2.1 mg/dL (ref 1.5–2.5)

## 2013-04-05 MED ORDER — SODIUM CHLORIDE 0.9 % IV BOLUS (SEPSIS)
250.0000 mL | Freq: Once | INTRAVENOUS | Status: AC
  Administered 2013-04-05: 250 mL via INTRAVENOUS

## 2013-04-05 MED ORDER — HYDROMORPHONE HCL PF 1 MG/ML IJ SOLN: 0.5000 mg | Freq: Once | INTRAMUSCULAR | Status: DC

## 2013-04-05 MED ORDER — ASPIRIN 81 MG PO CHEW
CHEWABLE_TABLET | ORAL | Status: AC
  Administered 2013-04-05: 81 mg
  Filled 2013-04-05: qty 1

## 2013-04-05 NOTE — Progress Notes (Signed)
Pt ran 2hr 80min of a 3.5hr hemodialysis Tx. Tx was stopped early for continuous drops in BP and vomiting that did not respond to getting saline. Dr. Florene Glen called and was informed of this and stated to stop the Tx.

## 2013-04-05 NOTE — Progress Notes (Signed)
[facility] PROGRESS NOTE  [name] BTD:[id number] DOB: [birth year] DOA: 04/01/2013 PCP: Redge Gainer, MD Brief narrative Pt is a 63 y.o. 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 He stated for the past 3 days he's had a cough productive of brownish sputum and overnight he developed worsening shortness of breath and came to the ED.chest x-ray showed persistent interstitial edema unchanged compared to prior exam (03/30/13), and persistent bilateral moderate pleural effusions with consolidation of both lungs.Per EDP he was hypoxic in the 70s initially, and wheezing on exam and treated with a continuous nebs, was placed on 5 L nasal cannula and O2 sats improved. Pulmonary was consulted regarding bilateral pleural effusions(reported to be loculated on CT) and per CCM more likely to secondary to CHF and no further eval recommended.  Assessment/Plan: . Bilateral loculated pleural effusion  -CT with contrast shows left greater than right loculated effusions - Noted that CT of his chest in November showed moderate L. pleural effusion -Per pulmonary effusions likely secondary to CHF and 2-D echo>> shows EF of 45-50% with grade 1 diastolic dysfunction .COPD with acute exacerbation  -continue nebulized bronchodilators, continue prednisone and mucolytics  -Continue Levaquin -Continue supplemental oxygen and follow.  -Appreciate pulmonary input . Acute on chronic diastolic & systolic CHF +/-HCAP (healthcare-associated pneumonia)  -Dialysis per renal for volume control  - echo with EF 45-50% and grade 1 diastolic dysfunction -on empiric antibiotics for HCAP changed to Levaquin per pulmonary . Acute on chronic respiratory failure  -Secondary to above see treatment as discussed  . End-stage renal disease on dialysis  -Dialysis per renal  . GERD  Continue  PPI  . h/o Hypertension  -No outpatient medications listed on med rec, monitor BPs and treat as appropriate  . History of CVA  . Hypokalemia  -Resolved . Nausea vomiting -s/p dialysis>>Abdomen x-ray negative  - now resolved .Hypotension -likely related to volume pulled off at dialysis -He is asymptomatic today, will monitor for now and avoid fluids given his CHF-consider cautious fluids  only if he becomes symptomatic.  Code Status: full Family Communication: None at the bedside Disposition Plan: monitor in step down for now, follow and Transfer to telemetry when BP stabilizes   Consultants:  renal  Procedures: Echo   Study Conclusions  - Left ventricle: The cavity size was normal. Wall thickness was increased in a pattern of mild LVH. Systolic function was mildly reduced. The estimated ejection fraction was in the range of 45% to 50%. Wall motion was normal; there were no regional wall motion abnormalities. Doppler parameters are consistent with abnormal left ventricular relaxation (grade 1 diastolic dysfunction). - Aortic valve: Valve area: 1.73cm^2(VTI). Valve area: 1.48cm^2 (Vmax). - Left atrium: The atrium was mildly dilated. - Right atrium: The atrium was mildly dilated. - Atrial septum: No defect or patent foramen ovale was identified.  Antibiotics:  vanc and cefepime started on 1/9>>1/11  Levaquin started on 1/11  HPI/Subjective: Events of last PM noted-nausea vomiting status post dialysis, also complained of abdominal pain last PM. States breathing better today denies chest pain, abdominal pain, no N/V today  Objective: Filed Vitals:   04/05/13 1100  BP: 98/38  Pulse:   Temp:   Resp: 18    Intake/Output Summary (Last 24 hours) at 04/05/13 1148 Last data filed at 04/05/13 1100  Gross per 24 hour  Intake  850 ml  Output   1115 ml  Net   -265 ml   Filed Weights   04/01/13 1824 04/02/13 1406 04/02/13 1755  Weight: 59.4 kg (130 lb 15.3 oz)  58.9 kg (129 lb 13.6 oz) 55.9 kg (123 lb 3.8 oz)    Exam:  General: alert & oriented x 3 In NAD Cardiovascular: RRR, nl S1 s2 Respiratory: Moderate air movement, no wheezes. Decreased breath sounds at the bases Abdomen: soft +BS NT/ND, no masses palpable Extremities: No cyanosis and no edema    Data Reviewed: Basic Metabolic Panel:  Recent Labs Lab 03/30/13 1153 04/01/13 0754 04/01/13 1313 04/02/13 0330 04/03/13 0300 04/05/13 0050  NA  --  142  --  141 138 138  K 3.4* 3.2*  --  4.4 3.8 3.7  CL  --  101  --  99 93* 93*  CO2  --   --   --  28 30 31   GLUCOSE  --  92  --  120* 119* 125*  BUN  --  21  --  13 14 25*  CREATININE  --  4.80*  --  2.92* 2.52* 3.93*  CALCIUM  --   --   --  9.9 9.8 9.9  MG  --   --   --   --   --  2.1  PHOS  --   --  3.8  --   --   --    Liver Function Tests: No results found for this basename: AST, ALT, ALKPHOS, BILITOT, PROT, ALBUMIN,  in the last 168 hours  Recent Labs Lab 04/05/13 0045  LIPASE 20   No results found for this basename: AMMONIA,  in the last 168 hours CBC:  Recent Labs Lab 04/01/13 0741 04/01/13 0754 04/03/13 0300  WBC 6.0  --  5.6  NEUTROABS 4.6  --   --   HGB 10.9* 13.3 11.2*  HCT 36.0* 39.0 38.3*  MCV 86.5  --  87.4  PLT 137*  --  215   Cardiac Enzymes:  Recent Labs Lab 04/01/13 2030 04/02/13 0200 04/02/13 0735 04/05/13 0050 04/05/13 0320  TROPONINI <0.30 <0.30 <0.30 <0.30 <0.30   BNP (last 3 results)  Recent Labs  12/25/12 1300 02/08/13 1903 04/01/13 2030  PROBNP 32295.0* 27711.0* 48968.0*   CBG:  Recent Labs Lab 04/01/13 1149 04/02/13 2210  GLUCAP 73 100*    Recent Results (from the past 240 hour(s))  MRSA PCR SCREENING     Status: None   Collection Time    04/01/13 12:04 PM      Result Value Range Status   MRSA by PCR NEGATIVE  NEGATIVE Final   Comment:            The GeneXpert MRSA Assay (FDA     approved for NASAL specimens     only), is one component of a     comprehensive  MRSA colonization     surveillance program. It is not     intended to diagnose MRSA     infection nor to guide or     monitor treatment for     MRSA infections.     Studies: Dg Abd Portable 1v  04/05/2013   CLINICAL DATA:  Mid abdominal pain with nausea and vomiting.  EXAM: PORTABLE ABDOMEN - 1 VIEW  COMPARISON:  Chest CT scanogram 04/02/2013  FINDINGS: No evidence of bowel obstruction. Left-sided calcified renal masses and diffuse arterial calcification are grossly stable from prior. No acute osseous findings.  IMPRESSION: No evidence of bowel obstruction.   Electronically Signed   By: Jorje Guild M.D.   On: 04/05/2013 01:54    Scheduled Meds: . calcium acetate  2,001 mg Oral TID WC  . cinacalcet  30 mg Oral Q breakfast  . doxercalciferol  2.5 mcg Intravenous Q M,W,F-HD  . DULoxetine  60 mg Oral Daily  . enoxaparin (LOVENOX) injection  30 mg Subcutaneous Q24H  . feeding supplement (NEPRO CARB STEADY)  237 mL Oral BID BM  . folic acid  1 mg Oral Daily  . gabapentin  300 mg Oral TID  . guaiFENesin  600 mg Oral BID  .  HYDROmorphone (DILAUDID) injection  0.5 mg Intravenous Once  . ipratropium-albuterol  3 mL Nebulization QID  . levofloxacin  500 mg Oral Q48H  . multivitamin  1 tablet Oral QHS  . pantoprazole  40 mg Oral Daily  . predniSONE  60 mg Oral Q breakfast  . sodium chloride  3 mL Intravenous Q12H   Continuous Infusions:   Active Problems:   ESRD on hemodialysis   Acute diastolic heart failure   COPD with acute exacerbation   HCAP (healthcare-associated pneumonia)   Pulmonary edema   Acute on chronic diastolic congestive heart failure   Acute-on-chronic respiratory failure   Mediastinal lymphadenopathy    Time spent: [facility] Pager [phone]. If 7PM-7AM, please contact night-coverage at [email or web address], password [employer] 04/05/2013, 11:48 AM  LOS: 4 days

## 2013-04-05 NOTE — Progress Notes (Signed)
Update: BP still fluctuating with last one systolic 975. NP to bedside. S: pt says he feels "terrible". Last episode of vomitus one hour ago. Feels "dry". No chest pain. Abd pain is generalized and dull. Last BM yesterday.  O: VS reviewed. HR stable, RR normal, O2 sat normal on 2L. Chronically ill appearing AAM in NAD. Alert and oriented.  Speech and behavior is appropriate. S1S2, RRR, few PACs, confirmed with EKG. Abd with hypoactive bowel sounds. Soft. No rebound or guarding.  A/P: 1. Hypotension/fluctuating perhaps secondary to being dry post HD-will give small 250cc NS bolus and monitor.         2. abd pain, n/v- Labs drawn for troponin, lipase, bmp and magnesium. Abd 1V ordered to r/o ileus or other cause            of n/v. Will cont to follow. Clance Boll, NP [facility]

## 2013-04-05 NOTE — Progress Notes (Signed)
Pt c/o new onset of aching mid abdominal pain that is 10/10. Notified Tylene Fantasia, NP via text page. New orders given.

## 2013-04-05 NOTE — Progress Notes (Signed)
Shift event: RN had paged this NP earlier in shift because pt was vomiting in HD and having fluctuating BPs. There was an order for pt to go to a tele bed after HD, but HD RN felt pt was too unstable and should go back to SDU where he was prior. I agreed and placed order for pt to return to [place].  Later, after pt back on unit, RN paged this NP because they thought they saw ST depression on the tele monitor and obtained an EKG. Pt having NO active chest pain at the moment. Still nauseated with soft BP and having abd pain. Pt says he has "done this before" when he had "too much fluid" pulled off in HD. Pt given 325mg  ASA once. Is already on O2.   New and old EKG taken for comparison to Dr. Shanon Brow of [facility] who doesn't see any acute changes. Will order troponins and monitor at this time since there are no noted EKG changes and no active chest pain.  Clance Boll, NP [facility]

## 2013-04-05 NOTE — Progress Notes (Signed)
Dr. Baltazar Najjar called and informed of pt having frequent hypotensive episodes in which the pt would begin vomiting following his hemodialysis Tx. Pt was to transfer to [place]. Pt to go back to 2H due to hypotension. Attempted to give pt Zofran but IV was clotted off. Pt transported back to [place] by his receiving nurse.

## 2013-04-05 NOTE — Progress Notes (Signed)
[facility] ROUNDING NOTE   Subjective:   Interval History: improved breathing  Objective:  Vital signs in last 24 hours:  Temp:  [97.7 F (36.5 C)-99 F (37.2 C)] 98.2 F (36.8 C) (01/13 0846) Pulse Rate:  [71-99] 99 (01/13 0100) Resp:  [10-29] 17 (01/13 1000) BP: (48-150)/(13-88) 87/29 mmHg (01/13 1000) SpO2:  [95 %-100 %] 100 % (01/13 0603)  Weight change:  Filed Weights   04/01/13 1824 04/02/13 1406 04/02/13 1755  Weight: 59.4 kg (130 lb 15.3 oz) 58.9 kg (129 lb 13.6 oz) 55.9 kg (123 lb 3.8 oz)    Intake/Output: I/O last 3 completed shifts: In: 1000 [P.O.:750; IV Piggyback:250] Out: 1829 [Other:1115]   Intake/Output this shift:  Total I/O In: 120 [P.O.:120] Out: -   CVS:RRR  Resp:decreased BS bases R>l. Wheezing better  Abd:+ BS NTND  Ext: No edema L AVG + bruit  NEURO:CNI Ox3 no asterixis    Basic Metabolic Panel:  Recent Labs Lab 03/30/13 1153 04/01/13 0754 04/01/13 1313 04/02/13 0330 04/03/13 0300 04/05/13 0050  NA  --  142  --  141 138 138  K 3.4* 3.2*  --  4.4 3.8 3.7  CL  --  101  --  99 93* 93*  CO2  --   --   --  28 30 31   GLUCOSE  --  92  --  120* 119* 125*  BUN  --  21  --  13 14 25*  CREATININE  --  4.80*  --  2.92* 2.52* 3.93*  CALCIUM  --   --   --  9.9 9.8 9.9  MG  --   --   --   --   --  2.1  PHOS  --   --  3.8  --   --   --     Liver Function Tests: No results found for this basename: AST, ALT, ALKPHOS, BILITOT, PROT, ALBUMIN,  in the last 168 hours  Recent Labs Lab 04/05/13 0045  LIPASE 20   No results found for this basename: AMMONIA,  in the last 168 hours  CBC:  Recent Labs Lab 04/01/13 0741 04/01/13 0754 04/03/13 0300  WBC 6.0  --  5.6  NEUTROABS 4.6  --   --   HGB 10.9* 13.3 11.2*  HCT 36.0* 39.0 38.3*  MCV 86.5  --  87.4  PLT 137*  --  215    Cardiac Enzymes:  Recent Labs Lab 04/01/13 2030 04/02/13 0200 04/02/13 0735 04/05/13 0050 04/05/13 0320  TROPONINI <0.30 <0.30 <0.30 <0.30  <0.30    BNP: No components found with this basename: POCBNP,   CBG:  Recent Labs Lab 04/01/13 1149 04/02/13 2210  GLUCAP 73 100*    Microbiology: Results for orders placed during the hospital encounter of 04/01/13  MRSA PCR SCREENING     Status: None   Collection Time    04/01/13 12:04 PM      Result Value Range Status   MRSA by PCR NEGATIVE  NEGATIVE Final   Comment:            The GeneXpert MRSA Assay (FDA     approved for NASAL specimens     only), is one component of a     comprehensive MRSA colonization     surveillance program. It is not     intended to diagnose MRSA     infection nor to guide or     monitor treatment for  MRSA infections.    Coagulation Studies: No results found for this basename: LABPROT, INR,  in the last 72 hours  Urinalysis: No results found for this basename: COLORURINE, APPERANCEUR, LABSPEC, PHURINE, GLUCOSEU, HGBUR, BILIRUBINUR, KETONESUR, PROTEINUR, UROBILINOGEN, NITRITE, LEUKOCYTESUR,  in the last 72 hours    Imaging: Dg Abd Portable 1v  04/05/2013   CLINICAL DATA:  Mid abdominal pain with nausea and vomiting.  EXAM: PORTABLE ABDOMEN - 1 VIEW  COMPARISON:  Chest CT scanogram 04/02/2013  FINDINGS: No evidence of bowel obstruction. Left-sided calcified renal masses and diffuse arterial calcification are grossly stable from prior. No acute osseous findings.  IMPRESSION: No evidence of bowel obstruction.   Electronically Signed   By: Jorje Guild M.D.   On: 04/05/2013 01:54     Medications:     . calcium acetate  2,001 mg Oral TID WC  . cinacalcet  30 mg Oral Q breakfast  . doxercalciferol  2.5 mcg Intravenous Q M,W,F-HD  . DULoxetine  60 mg Oral Daily  . enoxaparin (LOVENOX) injection  30 mg Subcutaneous Q24H  . feeding supplement (NEPRO CARB STEADY)  237 mL Oral BID BM  . folic acid  1 mg Oral Daily  . gabapentin  300 mg Oral TID  . guaiFENesin  600 mg Oral BID  .  HYDROmorphone (DILAUDID) injection  0.5 mg Intravenous  Once  . ipratropium-albuterol  3 mL Nebulization QID  . levofloxacin  500 mg Oral Q48H  . multivitamin  1 tablet Oral QHS  . ondansetron      . pantoprazole  40 mg Oral Daily  . predniSONE  60 mg Oral Q breakfast  . sodium chloride  3 mL Intravenous Q12H   sodium chloride, acetaminophen, acetaminophen, albuterol, ALPRAZolam, ondansetron (ZOFRAN) IV, ondansetron, oxyCODONE, oxyCODONE-acetaminophen, sodium chloride, tiZANidine  Assessment/ Plan:  1.Dyspnea Intrinsic lung Ds and pulmonary edema  2. HTN controlled  3.ESRD MWF [name]  4. 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 Mild anemia, not on EPO    LOS: 4 [name] W @TODAY @11 :18 AM

## 2013-04-06 ENCOUNTER — Inpatient Hospital Stay (HOSPITAL_COMMUNITY): Payer: Medicare Other

## 2013-04-06 ENCOUNTER — Encounter (HOSPITAL_COMMUNITY): Payer: Self-pay | Admitting: Radiology

## 2013-04-06 LAB — GLUCOSE, CAPILLARY
Glucose-Capillary: 61 mg/dL — ABNORMAL LOW (ref 70–99)
Glucose-Capillary: 47 mg/dL — ABNORMAL LOW (ref 70–99)
Glucose-Capillary: 53 mg/dL — ABNORMAL LOW (ref 70–99)
Glucose-Capillary: 118 mg/dL — ABNORMAL HIGH (ref 70–99)
Glucose-Capillary: 84 mg/dL (ref 70–99)
Glucose-Capillary: 95 mg/dL (ref 70–99)

## 2013-04-06 LAB — CBC
HCT: 36.8 % — ABNORMAL LOW (ref 39.0–52.0)
HCT: 34.7 % — ABNORMAL LOW (ref 39.0–52.0)
Hemoglobin: 10.9 g/dL — ABNORMAL LOW (ref 13.0–17.0)
Hemoglobin: 10.5 g/dL — ABNORMAL LOW (ref 13.0–17.0)
MCH: 25.9 pg — ABNORMAL LOW (ref 26.0–34.0)
MCH: 26.3 pg (ref 26.0–34.0)
MCHC: 29.6 g/dL — ABNORMAL LOW (ref 30.0–36.0)
MCHC: 30.3 g/dL (ref 30.0–36.0)
MCV: 87.4 fL (ref 78.0–100.0)
MCV: 86.8 fL (ref 78.0–100.0)
PLATELETS: 234 10*3/uL (ref 150–400)
PLATELETS: 266 10*3/uL (ref 150–400)
RBC: 4.21 MIL/uL — AB (ref 4.22–5.81)
RBC: 4 MIL/uL — AB (ref 4.22–5.81)
RDW: 20.8 % — AB (ref 11.5–15.5)
RDW: 21.2 % — ABNORMAL HIGH (ref 11.5–15.5)
WBC: 6.7 10*3/uL (ref 4.0–10.5)
WBC: 7.3 10*3/uL (ref 4.0–10.5)

## 2013-04-06 LAB — RENAL FUNCTION PANEL
Albumin: 2.8 g/dL — ABNORMAL LOW (ref 3.5–5.2)
BUN: 51 mg/dL — ABNORMAL HIGH (ref 6–23)
CALCIUM: 9.6 mg/dL (ref 8.4–10.5)
CHLORIDE: 92 meq/L — AB (ref 96–112)
CO2: 27 mEq/L (ref 19–32)
CREATININE: 6.8 mg/dL — AB (ref 0.50–1.35)
GFR, EST AFRICAN AMERICAN: 9 mL/min — AB (ref >90)
GFR, EST NON AFRICAN AMERICAN: 8 mL/min — AB (ref >90)
GLUCOSE: 136 mg/dL — AB (ref 70–99)
Phosphorus: 3.5 mg/dL (ref 2.3–4.6)
Potassium: 4.1 mEq/L (ref 3.7–5.3)
SODIUM: 135 meq/L — AB (ref 137–147)

## 2013-04-06 LAB — BASIC METABOLIC PANEL
BUN: 42 mg/dL — ABNORMAL HIGH (ref 6–23)
CHLORIDE: 93 meq/L — AB (ref 96–112)
CO2: 28 mEq/L (ref 19–32)
CREATININE: 5.87 mg/dL — AB (ref 0.50–1.35)
Calcium: 10.1 mg/dL (ref 8.4–10.5)
GFR calc non Af Amer: 9 mL/min — ABNORMAL LOW (ref >90)
GFR, EST AFRICAN AMERICAN: 11 mL/min — AB (ref >90)
Glucose, Bld: 96 mg/dL (ref 70–99)
Potassium: 4.1 mEq/L (ref 3.7–5.3)
SODIUM: 137 meq/L (ref 137–147)

## 2013-04-06 MED ORDER — FENTANYL CITRATE 0.05 MG/ML IJ SOLN
INTRAMUSCULAR | Status: AC | PRN
  Administered 2013-04-06: 50 ug via INTRAVENOUS

## 2013-04-06 MED ORDER — MIDAZOLAM HCL 2 MG/2ML IJ SOLN
INTRAMUSCULAR | Status: AC
  Filled 2013-04-06: qty 4

## 2013-04-06 MED ORDER — HEPARIN SODIUM (PORCINE) 1000 UNIT/ML IJ SOLN
INTRAMUSCULAR | Status: AC | PRN
  Administered 2013-04-06: 3000 [IU] via INTRAVENOUS

## 2013-04-06 MED ORDER — OXYCODONE-ACETAMINOPHEN 5-325 MG PO TABS
ORAL_TABLET | ORAL | Status: AC
  Filled 2013-04-06: qty 1

## 2013-04-06 MED ORDER — IOHEXOL 300 MG/ML  SOLN
100.0000 mL | Freq: Once | INTRAMUSCULAR | Status: AC | PRN
  Administered 2013-04-06: 50 mL via INTRAVENOUS

## 2013-04-06 MED ORDER — FENTANYL CITRATE 0.05 MG/ML IJ SOLN
INTRAMUSCULAR | Status: AC
  Filled 2013-04-06: qty 4

## 2013-04-06 MED ORDER — HEPARIN SODIUM (PORCINE) 5000 UNIT/ML IJ SOLN
5000.0000 [IU] | Freq: Three times a day (TID) | INTRAMUSCULAR | Status: DC
  Administered 2013-04-06 – 2013-04-07 (×2): 5000 [IU] via SUBCUTANEOUS
  Filled 2013-04-06 (×5): qty 1

## 2013-04-06 MED ORDER — GLUCOSE 40 % PO GEL
ORAL | Status: AC
  Administered 2013-04-06: 0.5
  Filled 2013-04-06: qty 1

## 2013-04-06 MED ORDER — MIDAZOLAM HCL 2 MG/2ML IJ SOLN
INTRAMUSCULAR | Status: AC | PRN
  Administered 2013-04-06: 1 mg via INTRAVENOUS

## 2013-04-06 MED ORDER — DOXERCALCIFEROL 4 MCG/2ML IV SOLN
INTRAVENOUS | Status: AC
  Administered 2013-04-06: 22:00:00
  Filled 2013-04-06: qty 2

## 2013-04-06 MED ORDER — HEPARIN SODIUM (PORCINE) 1000 UNIT/ML IJ SOLN
INTRAMUSCULAR | Status: AC
  Filled 2013-04-06: qty 1

## 2013-04-06 MED ORDER — IPRATROPIUM-ALBUTEROL 0.5-2.5 (3) MG/3ML IN SOLN
3.0000 mL | Freq: Two times a day (BID) | RESPIRATORY_TRACT | Status: DC
  Administered 2013-04-07: 3 mL via RESPIRATORY_TRACT
  Filled 2013-04-06: qty 3

## 2013-04-06 MED ORDER — ALTEPLASE 100 MG IV SOLR
4.0000 mg | Freq: Once | INTRAVENOUS | Status: AC
  Administered 2013-04-06: 3 mg
  Filled 2013-04-06: qty 4

## 2013-04-06 MED ORDER — GABAPENTIN 300 MG PO CAPS
300.0000 mg | ORAL_CAPSULE | Freq: Two times a day (BID) | ORAL | Status: DC
  Administered 2013-04-07: 300 mg via ORAL
  Filled 2013-04-06 (×2): qty 1

## 2013-04-06 NOTE — Procedures (Signed)
Technically successful declot of left upper arm dialysis graft.  No immediate post procedural complications.  

## 2013-04-06 NOTE — Progress Notes (Signed)
[facility] ROUNDING NOTE   Subjective:   Interval History: stable brought to dialysis left AVG clotted   Objective:  Vital signs in last 24 hours:  Temp:  [97.5 F (36.4 C)-98.8 F (37.1 C)] 97.5 F (36.4 C) (01/14 0719) Resp:  [15-25] 19 (01/14 0719) BP: (73-124)/(13-103) 124/77 mmHg (01/14 0719) SpO2:  [95 %-97 %] 96 % (01/14 0719) Weight:  [57.2 kg (126 lb 1.7 oz)] 57.2 kg (126 lb 1.7 oz) (01/14 0500)  Weight change:  Filed Weights   04/02/13 1406 04/02/13 1755 04/06/13 0500  Weight: 58.9 kg (129 lb 13.6 oz) 55.9 kg (123 lb 3.8 oz) 57.2 kg (126 lb 1.7 oz)    Intake/Output: I/O last 3 completed shifts: In: 796 [P.O.:540; I.V.:6; IV Piggyback:250] Out: 4174 [Other:1115]   Intake/Output this shift:     CVS:RRR  Resp:decreased BS bases R>l. Wheezing better  Abd:+ BS NTND  Ext: No edema L AVG + bruit  NEURO:CNI Ox3 no asterixis    Basic Metabolic Panel:  Recent Labs Lab 04/01/13 0754 04/01/13 1313  04/02/13 0330 04/03/13 0300 04/05/13 0050 04/06/13 0215  NA 142  --   --  141 138 138 137  K 3.2*  --   --  4.4 3.8 3.7 4.1  CL 101  --   --  99 93* 93* 93*  CO2  --   --   --  28 30 31 28   GLUCOSE 92  --   --  120* 119* 125* 96  BUN 21  --   --  13 14 25* 42*  CREATININE 4.80*  --   --  2.92* 2.52* 3.93* 5.87*  CALCIUM  --   --   < > 9.9 9.8 9.9 10.1  MG  --   --   --   --   --  2.1  --   PHOS  --  3.8  --   --   --   --   --   < > = values in this interval not displayed.  Liver Function Tests: No results found for this basename: AST, ALT, ALKPHOS, BILITOT, PROT, ALBUMIN,  in the last 168 hours  Recent Labs Lab 04/05/13 0045  LIPASE 20   No results found for this basename: AMMONIA,  in the last 168 hours  CBC:  Recent Labs Lab 04/01/13 0741 04/01/13 0754 04/03/13 0300 04/06/13 0215  WBC 6.0  --  5.6 6.7  NEUTROABS 4.6  --   --   --   HGB 10.9* 13.3 11.2* 10.9*  HCT 36.0* 39.0 38.3* 36.8*  MCV 86.5  --  87.4 87.4  PLT 137*   --  215 234    Cardiac Enzymes:  Recent Labs Lab 04/02/13 0200 04/02/13 0735 04/05/13 0050 04/05/13 0320 04/05/13 1210  TROPONINI <0.30 <0.30 <0.30 <0.30 <0.30    BNP: No components found with this basename: POCBNP,   CBG:  Recent Labs Lab 04/05/13 1729 04/05/13 2144 04/06/13 0721 04/06/13 0812 04/06/13 0816  GLUCAP 138* 111* 61* 47* 26*    Microbiology: Results for orders placed during the hospital encounter of 04/01/13  MRSA PCR SCREENING     Status: None   Collection Time    04/01/13 12:04 PM      Result Value Range Status   MRSA by PCR NEGATIVE  NEGATIVE Final   Comment:            The GeneXpert MRSA Assay (FDA     approved for NASAL specimens  only), is one component of a     comprehensive MRSA colonization     surveillance program. It is not     intended to diagnose MRSA     infection nor to guide or     monitor treatment for     MRSA infections.    Coagulation Studies: No results found for this basename: LABPROT, INR,  in the last 72 hours  Urinalysis: No results found for this basename: COLORURINE, APPERANCEUR, LABSPEC, PHURINE, GLUCOSEU, HGBUR, BILIRUBINUR, KETONESUR, PROTEINUR, UROBILINOGEN, NITRITE, LEUKOCYTESUR,  in the last 72 hours    Imaging: Dg Abd Portable 1v  04/05/2013   CLINICAL DATA:  Mid abdominal pain with nausea and vomiting.  EXAM: PORTABLE ABDOMEN - 1 VIEW  COMPARISON:  Chest CT scanogram 04/02/2013  FINDINGS: No evidence of bowel obstruction. Left-sided calcified renal masses and diffuse arterial calcification are grossly stable from prior. No acute osseous findings.  IMPRESSION: No evidence of bowel obstruction.   Electronically Signed   By: Jorje Guild M.D.   On: 04/05/2013 01:54     Medications:     . calcium acetate  2,001 mg Oral TID WC  . cinacalcet  30 mg Oral Q breakfast  . dextrose      . doxercalciferol  2.5 mcg Intravenous Q M,W,F-HD  . DULoxetine  60 mg Oral Daily  . enoxaparin (LOVENOX) injection  30  mg Subcutaneous Q24H  . feeding supplement (NEPRO CARB STEADY)  237 mL Oral BID BM  . folic acid  1 mg Oral Daily  . gabapentin  300 mg Oral TID  . guaiFENesin  600 mg Oral BID  .  HYDROmorphone (DILAUDID) injection  0.5 mg Intravenous Once  . ipratropium-albuterol  3 mL Nebulization QID  . levofloxacin  500 mg Oral Q48H  . multivitamin  1 tablet Oral QHS  . pantoprazole  40 mg Oral Daily  . predniSONE  60 mg Oral Q breakfast  . sodium chloride  3 mL Intravenous Q12H   sodium chloride, acetaminophen, acetaminophen, albuterol, ALPRAZolam, ondansetron (ZOFRAN) IV, ondansetron, oxyCODONE, oxyCODONE-acetaminophen, sodium chloride, tiZANidine  Assessment/ Plan:  1.Dyspnea Intrinsic lung Ds and pulmonary edema  2. HTN controlled  3.ESRD MWF [name]  4. 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 Mild anemia, not on EPO 6.Clotted AVG  Arrange declot     LOS: 5 Richelle Glick W @TODAY @8 :49 AM

## 2013-04-06 NOTE — Progress Notes (Signed)
NUTRITION FOLLOW UP  Intervention:   1.  Supplements; continue Nepro Shake po BID, each supplement provides 425 kcal and 19 grams protein 2.  General healthful diet; encourage intake of foods and beverages as able.  RD to follow and assess for nutritional adequacy.   Nutrition Dx:   Unintended wt change related to increased metabolic demand as evidenced by pt on dialysis.   Monitor:  1. Food/Beverage; pt meeting >/=90% estimated needs with tolerance.  2. Wt/wt change; monitor trends  Assessment:   Pt admitted with shortness of breath, pulmonary edema. Pt with h/o CKD on HD. Pt s/p left transmetatarsal amputation.   Pt states that sometimes he gets nauseated.  Events from most recent HD session noted.  Pt reports he is eating 50% of his meals which he feels is slightly less than his usual.  He did get Nepro today which he liked. He typically drinks Nepro BID at home. Encouraged pt to continue this as inpatient.   Nutrition Focused Physical Exam: Subcutaneous Fat:  Orbital Region: moderate wasting Upper Arm Region: moderate wasting Thoracic and Lumbar Region: moderate wasting  Muscle:  Temple Region: moderate-severe wasting Clavicle Bone Region: severe wasting Clavicle and Acromion Bone Region: moderate wasting Scapular Bone Region: moderate-severe wasting Dorsal Hand: moderate wasting Patellar Region: severe wasting Anterior Thigh Region: severe wasting Posterior Calf Region: severe wasting  Edema: none present    Height: Ht Readings from Last 1 Encounters:  04/01/13 5\' 6"  (1.676 m)    Weight Status:   Wt Readings from Last 1 Encounters:  04/06/13 126 lb 1.7 oz (57.2 kg)    Re-estimated needs:  Kcal: 1900-2100 Protein: 80-90g Fluid: per MD discretion  Skin:  Non-pitting edema Intact  Diet Order: NPO   Intake/Output Summary (Last 24 hours) at 04/06/13 1011 Last data filed at 04/06/13 0700  Gross per 24 hour  Intake    666 ml  Output      0 ml  Net     666 ml    Last BM: 1/13   Labs:   Recent Labs Lab 04/01/13 1313  04/03/13 0300 04/05/13 0050 04/06/13 0215  NA  --   < > 138 138 137  K  --   < > 3.8 3.7 4.1  CL  --   < > 93* 93* 93*  CO2  --   < > 30 31 28   BUN  --   < > 14 25* 42*  CREATININE  --   < > 2.52* 3.93* 5.87*  CALCIUM  --   < > 9.8 9.9 10.1  MG  --   --   --  2.1  --   PHOS 3.8  --   --   --   --   GLUCOSE  --   < > 119* 125* 96  < > = values in this interval not displayed.  CBG (last 3)   Recent Labs  04/06/13 0812 04/06/13 0816 04/06/13 0851  GLUCAP 47* 53* 118*    Scheduled Meds: . calcium acetate  2,001 mg Oral TID WC  . cinacalcet  30 mg Oral Q breakfast  . doxercalciferol  2.5 mcg Intravenous Q M,W,F-HD  . DULoxetine  60 mg Oral Daily  . enoxaparin (LOVENOX) injection  30 mg Subcutaneous Q24H  . feeding supplement (NEPRO CARB STEADY)  237 mL Oral BID BM  . folic acid  1 mg Oral Daily  . gabapentin  300 mg Oral TID  . guaiFENesin  600 mg  Oral BID  .  HYDROmorphone (DILAUDID) injection  0.5 mg Intravenous Once  . ipratropium-albuterol  3 mL Nebulization QID  . levofloxacin  500 mg Oral Q48H  . multivitamin  1 tablet Oral QHS  . pantoprazole  40 mg Oral Daily  . predniSONE  60 mg Oral Q breakfast  . sodium chloride  3 mL Intravenous Q12H    Continuous Infusions:   Brynda Greathouse, MS RD LDN Clinical Inpatient Dietitian Pager: [phone] Weekend/After hours pager: [phone]

## 2013-04-06 NOTE — Progress Notes (Signed)
[facility] PROGRESS NOTE  MORRILL BOMKAMP YNW:[id number] DOB: 05-15-1950 DOA: 04/01/2013 PCP: Redge Gainer, MD Brief narrative Pt is a 63 y.o. 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 Chest x-ray showed persistent interstitial edema unchanged compared to prior exam (03/30/13), and persistent bilateral moderate pleural effusions with consolidation of both lungs. He was hypoxic in the 70s initially, and was wheezing on exam and treated with a continuous nebs, was placed on 5 L nasal cannula and O2 sats improved. Pulmonary was consulted regarding bilateral pleural effusions(reported to be loculated on CT) and per CCM more likely to secondary to CHF and no further eval recommended.  Assessment/Plan: . Bilateral loculated pleural effusion  -CT with contrast shows left greater than right loculated effusions - Noted that CT of his chest in November showed moderate L. pleural effusion -Per pulmonary effusions likely secondary to CHF and 2-D echo>> shows EF of 45-50% with grade 1 diastolic dysfunction No further intervention.   Marland KitchenCOPD with acute exacerbation  -continue nebulized bronchodilators, continue prednisone and mucolytics  -Continue Levaquin -Continue supplemental oxygen and follow.  -Appreciate pulmonary input He is much improved.  . Acute on chronic diastolic & systolic CHF +/-HCAP (healthcare-associated pneumonia)  -Dialysis per renal for volume control  - echo with EF 45-50% and grade 1 diastolic dysfunction -on empiric antibiotics for HCAP changed to Levaquin per pulmonary  . Acute on chronic respiratory failure  -Secondary to above see treatment as discussed   . End-stage renal disease on dialysis  -Dialysis per renal  -Access seems to have clotted. IR to intervene today. BP fluctuates while on HD.  Marland Kitchen GERD  Continue PPI   .  History of CVA  Stable. Ambulatory per patient.  . Nausea vomiting -s/p dialysis>>Abdomen x-ray negative  - now resolved  . Hypotension -likely related to volume pulled off at dialysis -He is asymptomatic today, will monitor for now and avoid fluids given his CHF-consider cautious fluids  only if he becomes symptomatic.  Code Status: full DVT Prophylaxis: Enoxaparin Family Communication: Discussed with patient Disposition Plan: monitor in step down for now till HD is completed today. Transfer to telemetry when BP stabilizes. Will go home when medically ready.   Consultants:  Nephrology  IR  Procedures: Echo Study Conclusions - Left ventricle: The cavity size was normal. Wall thickness was increased in a pattern of mild LVH. Systolic function was mildly reduced. The estimated ejection fraction was in the range of 45% to 50%. Wall motion was normal; there were no regional wall motion abnormalities. Doppler parameters are consistent with abnormal left ventricular relaxation (grade 1 diastolic dysfunction). - Aortic valve: Valve area: 1.73cm^2(VTI). Valve area: 1.48cm^2 (Vmax). - Left atrium: The atrium was mildly dilated. - Right atrium: The atrium was mildly dilated. - Atrial septum: No defect or patent foramen ovale was identified.  Antibiotics:  vanc and cefepime started on 1/9>>1/11  Levaquin started on 1/11  HPI/Subjective: Patient feels better today. Denies shortness of breath or chest pain. No nausea or vomiting.  Objective: Filed Vitals:   04/06/13 1100  BP: 103/37  Pulse:   Temp: 98.3 F (36.8 C)  Resp: 19    Intake/Output Summary (Last 24 hours) at 04/06/13 1201 Last data filed at 04/06/13 0700  Gross per 24 hour  Intake    426 ml  Output      0 ml  Net  426 ml   Filed Weights   04/02/13 1406 04/02/13 1755 04/06/13 0500  Weight: 58.9 kg (129 lb 13.6 oz) 55.9 kg (123 lb 3.8 oz) 57.2 kg (126 lb 1.7 oz)    Exam:  General: alert & oriented x 3 In  NAD Cardiovascular: RRR, nl S1 s2 Respiratory: Moderate air movement, no wheezes. Decreased breath sounds at the bases Abdomen: soft +BS NT/ND, no masses palpable Extremities: No cyanosis and no edema    Data Reviewed: Basic Metabolic Panel:  Recent Labs Lab 04/01/13 0754 04/01/13 1313 04/02/13 0330 04/03/13 0300 04/05/13 0050 04/06/13 0215  NA 142  --  141 138 138 137  K 3.2*  --  4.4 3.8 3.7 4.1  CL 101  --  99 93* 93* 93*  CO2  --   --  28 30 31 28   GLUCOSE 92  --  120* 119* 125* 96  BUN 21  --  13 14 25* 42*  CREATININE 4.80*  --  2.92* 2.52* 3.93* 5.87*  CALCIUM  --   --  9.9 9.8 9.9 10.1  MG  --   --   --   --  2.1  --   PHOS  --  3.8  --   --   --   --     Recent Labs Lab 04/05/13 0045  LIPASE 20   CBC:  Recent Labs Lab 04/01/13 0741 04/01/13 0754 04/03/13 0300 04/06/13 0215  WBC 6.0  --  5.6 6.7  NEUTROABS 4.6  --   --   --   HGB 10.9* 13.3 11.2* 10.9*  HCT 36.0* 39.0 38.3* 36.8*  MCV 86.5  --  87.4 87.4  PLT 137*  --  215 234   Cardiac Enzymes:  Recent Labs Lab 04/02/13 0200 04/02/13 0735 04/05/13 0050 04/05/13 0320 04/05/13 1210  TROPONINI <0.30 <0.30 <0.30 <0.30 <0.30   BNP (last 3 results)  Recent Labs  12/25/12 1300 02/08/13 1903 04/01/13 2030  PROBNP 32295.0* 27711.0* 48968.0*   CBG:  Recent Labs Lab 04/05/13 2144 04/06/13 0721 04/06/13 0812 04/06/13 0816 04/06/13 0851  GLUCAP 111* 61* 47* 53* 118*    Recent Results (from the past 240 hour(s))  MRSA PCR SCREENING     Status: None   Collection Time    04/01/13 12:04 PM      Result Value Range Status   MRSA by PCR NEGATIVE  NEGATIVE Final   Comment:            The GeneXpert MRSA Assay (FDA     approved for NASAL specimens     only), is one component of a     comprehensive MRSA colonization     surveillance program. It is not     intended to diagnose MRSA     infection nor to guide or     monitor treatment for     MRSA infections.     Studies: Dg Abd  Portable 1v  04/05/2013   CLINICAL DATA:  Mid abdominal pain with nausea and vomiting.  EXAM: PORTABLE ABDOMEN - 1 VIEW  COMPARISON:  Chest CT scanogram 04/02/2013  FINDINGS: No evidence of bowel obstruction. Left-sided calcified renal masses and diffuse arterial calcification are grossly stable from prior. No acute osseous findings.  IMPRESSION: No evidence of bowel obstruction.   Electronically Signed   By: Jorje Guild M.D.   On: 04/05/2013 01:54    Scheduled Meds: . alteplase  4 mg Intracatheter Once  . calcium acetate  2,001  mg Oral TID WC  . cinacalcet  30 mg Oral Q breakfast  . doxercalciferol  2.5 mcg Intravenous Q M,W,F-HD  . DULoxetine  60 mg Oral Daily  . enoxaparin (LOVENOX) injection  30 mg Subcutaneous Q24H  . feeding supplement (NEPRO CARB STEADY)  237 mL Oral BID BM  . folic acid  1 mg Oral Daily  . gabapentin  300 mg Oral TID  . guaiFENesin  600 mg Oral BID  .  HYDROmorphone (DILAUDID) injection  0.5 mg Intravenous Once  . ipratropium-albuterol  3 mL Nebulization QID  . levofloxacin  500 mg Oral Q48H  . multivitamin  1 tablet Oral QHS  . pantoprazole  40 mg Oral Daily  . predniSONE  60 mg Oral Q breakfast  . sodium chloride  3 mL Intravenous Q12H   Continuous Infusions:   Active Problems:   ESRD on hemodialysis   Acute diastolic heart failure   COPD with acute exacerbation   HCAP (healthcare-associated pneumonia)   Pulmonary edema   Acute on chronic diastolic congestive heart failure   Acute-on-chronic respiratory failure   Mediastinal lymphadenopathy    [facility]  [facility] Pager [phone].   If 7PM-7AM, please contact night-coverage at [email or web address], password [facility] 04/06/2013, 12:01 PM  LOS: 5 days

## 2013-04-06 NOTE — H&P (Signed)
[name] is an 63 y.o. male.   Chief Complaint: clotted left upper arm dialysis graft Last use 04/04/13: good flow but only dialyzed for 2 hrs- became hypotensive and admitted to hospital Attempted this am: clotted Previous intervention (declot): same graft- 02/14/13 and 02/22/13- successful Now scheduled for Left upper arm dialysis graft thrombolysis and possible angioplasty/stent placement. Possible dialysis catheter placement if needed.  HPI: ESRD; HLD; anemia; CHF; PVD; smoker; cataracts; CVA; asthma  Past Medical History  Diagnosis Date  . Hyperlipidemia   . Arthritis   . Claudication   . Anemia   . Chronic diastolic CHF (congestive heart failure) 07/2010    a. 07/2008 Echo EF 50-55%, mild-mod LVH, Gr 1 DD.  Marland Kitchen Peripheral vascular occlusive disease     a. 07/2011 Periph Angio: No signif Ao-illiac dzs, LCF stenosis, 100% LSFA w recon above knee pop and 3 vessel runoff, 100% RSFA w/ recon above knee --> pending L Fem to below Knee pop bypass.  Marland Kitchen History of tobacco abuse   . Hypertension     takes Amlodipine,Metoprolol,and Prinivil daily  . Dyspnea on exertion     with exertion  . Cataract     bilateral  . Dialysis patient   . PUD (peptic ulcer disease) mid 1990's    with GI bleed.  endoscoped in mid 1990's at [facility]  . Stroke 2005    no residual  . Asthma     08/2012  . GERD (gastroesophageal reflux disease)   . History of blood transfusion     peptic ulcer  . ESRD (end stage renal disease)     a. MWF dialysis in [place] (followed by Dr. Lowanda Foster)  . COPD (chronic obstructive pulmonary disease) 04/01/2013    Past Surgical History  Procedure Laterality Date  . Arteriovenous graft placement    . Av fistula placement  12/10/2000    Right brachiocephalic arteriovenous   . Aortagram  07/29/2011    Abdominal Aortagram  . Cardiac catheterization  08/01/11    Left heart catheterization  . Femoral-popliteal bypass graft  09/09/2011    Procedure: BYPASS GRAFT  FEMORAL-POPLITEAL ARTERY;  Surgeon: Serafina Mitchell, MD;  Location: [place];  Service: Vascular;  Laterality: Left;  . Amputation  09/12/2011    Procedure: AMPUTATION BELOW KNEE;  Surgeon: Serafina Mitchell, MD;  Location: [facility] OR;  Service: Vascular;  Laterality: Left;  TRANSMETATARSAL  . Application of wound vac  10/30/2011    Procedure: APPLICATION OF WOUND VAC;  Surgeon: Serafina Mitchell, MD;  Location: [place];  Service: Vascular;  Laterality: Left;  . Groin debridement  11/01/2011    Procedure: Virl Son DEBRIDEMENT;  Surgeon: Angelia Mould, MD;  Location: [facility] OR;  Service: Vascular;  Laterality: Left;  . Esophagogastroduodenoscopy  11/06/2011    Procedure: ESOPHAGOGASTRODUODENOSCOPY (EGD);  Surgeon: Jerene Bears, MD;  Location: [place];  Service: Gastroenterology;  Laterality: N/A;  . Femoral-popliteal bypass graft  01/02/2012    Procedure: BYPASS GRAFT FEMORAL-POPLITEAL ARTERY;  Surgeon: Serafina Mitchell, MD;  Location: [place];  Service: Vascular;  Laterality: Right;  . Amputation  01/02/2012    Procedure: AMPUTATION RAY;  Surgeon: Serafina Mitchell, MD;  Location: [facility] OR;  Service: Vascular;  Laterality: Right;  . Toe amputation Right     5/13- great toe  . Exchange of a dialysis catheter N/A 07/15/2012    Procedure: EXCHANGE OF A DIALYSIS CATHETER;  Surgeon: Conrad Alcalde, MD;  Location: [place];  Service: Vascular;  Laterality: N/A;  . Av fistula placement Left 07/15/2012    Procedure: ARTERIOVENOUS (AV) FISTULA CREATION;  Surgeon: Conrad Golovin, MD;  Location: [place];  Service: Vascular;  Laterality: Left;  . Av fistula placement Left 09/07/2012    Procedure: INSERTION OF ARTERIOVENOUS (AV) GORE-TEX GRAFT ARM;  Surgeon: Conrad Tomah, MD;  Location: [place];  Service: Vascular;  Laterality: Left;  . Insertion of dialysis catheter Right 09/07/2012    Procedure: INSERTION OF a Femoral DIALYSIS CATHETER;  Surgeon: Conrad Cathcart, MD;  Location: [place];  Service: Vascular;  Laterality: Right;  . Lipoma excision  Left 09/07/2012    Procedure: EXCISION Seroma left arm;  Surgeon: Conrad Nichols, MD;  Location: [place];  Service: Vascular;  Laterality: Left;  . Hernia repair      umbilical  . Thrombectomy and revision of arterioventous (av) goretex  graft Left 11/30/2012    Procedure: THROMBECTOMY AND REVISION OF ARTERIOVENTOUS (AV) GORETEX  GRAFT;  Surgeon: Angelia Mould, MD;  Location: [facility] OR;  Service: Vascular;  Laterality: Left;    Family History  Problem Relation Age of Onset  . Breast cancer Mother   . Cancer Mother   . Cancer Father   . Anesthesia problems Neg Hx   . Hypotension Neg Hx   . Malignant hyperthermia Neg Hx   . Pseudochol deficiency Neg Hx    Social History:  reports that he has been smoking Cigarettes.  He has a 20 pack-year smoking history. He has never used smokeless tobacco. He reports that he does not drink alcohol or use illicit drugs.  Allergies:  Allergies  Allergen Reactions  . Ambien [Zolpidem Tartrate] Other (See Comments)    Hallucinations    Medications Prior to Admission  Medication Sig Dispense Refill  . ALPRAZolam (XANAX) 1 MG tablet Take 1 tablet (1 mg total) by mouth 2 (two) times daily as needed for anxiety or sleep.  60 tablet  2  . calcium acetate (PHOSLO) 667 MG capsule Take 1,334 mg by mouth 3 (three) times daily with meals.       . DULoxetine (CYMBALTA) 60 MG capsule Take 60 mg by mouth daily.      . folic acid (FOLVITE) 1 MG tablet Take 1 mg by mouth daily.      Marland Kitchen gabapentin (NEURONTIN) 300 MG capsule Take 1 capsule (300 mg total) by mouth 3 (three) times daily.  90 capsule  11  . multivitamin (RENA-VIT) TABS tablet Take 1 tablet by mouth daily.      Marland Kitchen omeprazole (PRILOSEC) 20 MG capsule Take 20 mg by mouth daily.      Marland Kitchen oxyCODONE-acetaminophen (PERCOCET) 7.5-325 MG per tablet Take 1 tablet by mouth every 8 (eight) hours as needed for pain.  20 tablet  0  . PROAIR HFA 108 (90 BASE) MCG/ACT inhaler Inhale 1 puff into the lungs every 4 (four) hours  as needed for wheezing.       Marland Kitchen tiZANidine (ZANAFLEX) 4 MG tablet Take 4 mg by mouth every 6 (six) hours as needed for muscle spasms.         Results for orders placed during the hospital encounter of 04/01/13 (from the past 48 hour(s))  LIPASE, BLOOD     Status: None   Collection Time    04/05/13 12:45 AM      Result Value Range   Lipase 20  11 - 59 U/L  TROPONIN I     Status: None   Collection  Time    04/05/13 12:50 AM      Result Value Range   Troponin I <0.30  <0.30 ng/mL   Comment:            Due to the release kinetics of cTnI,     a negative result within the first hours     of the onset of symptoms does not rule out     myocardial infarction with certainty.     If myocardial infarction is still suspected,     repeat the test at appropriate intervals.  BASIC METABOLIC PANEL     Status: Abnormal   Collection Time    04/05/13 12:50 AM      Result Value Range   Sodium 138  137 - 147 mEq/L   Potassium 3.7  3.7 - 5.3 mEq/L   Chloride 93 (*) 96 - 112 mEq/L   CO2 31  19 - 32 mEq/L   Glucose, Bld 125 (*) 70 - 99 mg/dL   BUN 25 (*) 6 - 23 mg/dL   Comment: DELTA CHECK NOTED   Creatinine, Ser 3.93 (*) 0.50 - 1.35 mg/dL   Comment: DELTA CHECK NOTED   Calcium 9.9  8.4 - 10.5 mg/dL   GFR calc non Af Amer 15 (*) >90 mL/min   GFR calc Af Amer 17 (*) >90 mL/min   Comment: (NOTE)     The eGFR has been calculated using the CKD EPI equation.     This calculation has not been validated in all clinical situations.     eGFR's persistently <90 mL/min signify possible Chronic Kidney     Disease.  MAGNESIUM     Status: None   Collection Time    04/05/13 12:50 AM      Result Value Range   Magnesium 2.1  1.5 - 2.5 mg/dL  PROCALCITONIN     Status: None   Collection Time    04/05/13  3:20 AM      Result Value Range   Procalcitonin 1.59     Comment:            Interpretation:     PCT > 0.5 ng/mL and <= 2 ng/mL:     Systemic infection (sepsis) is possible,     but other conditions are  known to elevate     PCT as well.     (NOTE)             ICU PCT Algorithm               Non ICU PCT Algorithm        ----------------------------     ------------------------------             PCT < 0.25 ng/mL                 PCT < 0.1 ng/mL         Stopping of antibiotics            Stopping of antibiotics           strongly encouraged.               strongly encouraged.        ----------------------------     ------------------------------           PCT level decrease by               PCT < 0.25 ng/mL           >= 80%  from peak PCT           OR PCT 0.25 - 0.5 ng/mL          Stopping of antibiotics                                                 encouraged.         Stopping of antibiotics               encouraged.        ----------------------------     ------------------------------           PCT level decrease by              PCT >= 0.25 ng/mL           < 80% from peak PCT            AND PCT >= 0.5 ng/mL            Continuing antibiotics                                                  encouraged.           Continuing antibiotics                encouraged.        ----------------------------     ------------------------------         PCT level increase compared          PCT > 0.5 ng/mL             with peak PCT AND              PCT >= 0.5 ng/mL             Escalation of antibiotics                                              strongly encouraged.          Escalation of antibiotics            strongly encouraged.  TROPONIN I     Status: None   Collection Time    04/05/13  3:20 AM      Result Value Range   Troponin I <0.30  <0.30 ng/mL   Comment:            Due to the release kinetics of cTnI,     a negative result within the first hours     of the onset of symptoms does not rule out     myocardial infarction with certainty.     If myocardial infarction is still suspected,     repeat the test at appropriate intervals.  GLUCOSE, CAPILLARY     Status: None   Collection Time     04/05/13  8:53 AM      Result Value Range   Glucose-Capillary 77  70 - 99 mg/dL  TROPONIN I     Status: None   Collection Time    04/05/13 12:10 PM  Result Value Range   Troponin I <0.30  <0.30 ng/mL   Comment:            Due to the release kinetics of cTnI,     a negative result within the first hours     of the onset of symptoms does not rule out     myocardial infarction with certainty.     If myocardial infarction is still suspected,     repeat the test at appropriate intervals.  GLUCOSE, CAPILLARY     Status: None   Collection Time    04/05/13 12:44 PM      Result Value Range   Glucose-Capillary 99  70 - 99 mg/dL  GLUCOSE, CAPILLARY     Status: Abnormal   Collection Time    04/05/13  5:29 PM      Result Value Range   Glucose-Capillary 138 (*) 70 - 99 mg/dL  GLUCOSE, CAPILLARY     Status: Abnormal   Collection Time    04/05/13  9:44 PM      Result Value Range   Glucose-Capillary 111 (*) 70 - 99 mg/dL  CBC     Status: Abnormal   Collection Time    04/06/13  2:15 AM      Result Value Range   WBC 6.7  4.0 - 10.5 K/uL   RBC 4.21 (*) 4.22 - 5.81 MIL/uL   Hemoglobin 10.9 (*) 13.0 - 17.0 g/dL   HCT 36.8 (*) 39.0 - 52.0 %   MCV 87.4  78.0 - 100.0 fL   MCH 25.9 (*) 26.0 - 34.0 pg   MCHC 29.6 (*) 30.0 - 36.0 g/dL   RDW 20.8 (*) 11.5 - 15.5 %   Platelets 234  150 - 400 K/uL   Comment: PLATELET COUNT CONFIRMED BY SMEAR  BASIC METABOLIC PANEL     Status: Abnormal   Collection Time    04/06/13  2:15 AM      Result Value Range   Sodium 137  137 - 147 mEq/L   Potassium 4.1  3.7 - 5.3 mEq/L   Chloride 93 (*) 96 - 112 mEq/L   CO2 28  19 - 32 mEq/L   Glucose, Bld 96  70 - 99 mg/dL   BUN 42 (*) 6 - 23 mg/dL   Creatinine, Ser 5.87 (*) 0.50 - 1.35 mg/dL   Calcium 10.1  8.4 - 10.5 mg/dL   GFR calc non Af Amer 9 (*) >90 mL/min   GFR calc Af Amer 11 (*) >90 mL/min   Comment: (NOTE)     The eGFR has been calculated using the CKD EPI equation.     This calculation has not  been validated in all clinical situations.     eGFR's persistently <90 mL/min signify possible Chronic Kidney     Disease.  GLUCOSE, CAPILLARY     Status: Abnormal   Collection Time    04/06/13  7:21 AM      Result Value Range   Glucose-Capillary 61 (*) 70 - 99 mg/dL  GLUCOSE, CAPILLARY     Status: Abnormal   Collection Time    04/06/13  8:12 AM      Result Value Range   Glucose-Capillary 47 (*) 70 - 99 mg/dL  GLUCOSE, CAPILLARY     Status: Abnormal   Collection Time    04/06/13  8:16 AM      Result Value Range   Glucose-Capillary 53 (*) 70 - 99 mg/dL  GLUCOSE, CAPILLARY  Status: Abnormal   Collection Time    04/06/13  8:51 AM      Result Value Range   Glucose-Capillary 118 (*) 70 - 99 mg/dL   Dg Abd Portable 1v  04/05/2013   CLINICAL DATA:  Mid abdominal pain with nausea and vomiting.  EXAM: PORTABLE ABDOMEN - 1 VIEW  COMPARISON:  Chest CT scanogram 04/02/2013  FINDINGS: No evidence of bowel obstruction. Left-sided calcified renal masses and diffuse arterial calcification are grossly stable from prior. No acute osseous findings.  IMPRESSION: No evidence of bowel obstruction.   Electronically Signed   By: Jorje Guild M.D.   On: 04/05/2013 01:54    Review of Systems  Constitutional: Positive for weight loss. Negative for fever.  Respiratory: Negative for shortness of breath.   Cardiovascular: Negative for chest pain.  Gastrointestinal: Negative for nausea, vomiting and abdominal pain.  Neurological: Positive for weakness. Negative for dizziness and headaches.    Blood pressure 95/57, pulse 99, temperature 97.5 F (36.4 C), temperature source Oral, resp. rate 18, height 5' 6"  (1.676 m), weight 126 lb 1.7 oz (57.2 kg), SpO2 96.00%. Physical Exam  Constitutional: He is oriented to person, place, and time.  Thin;frail  Cardiovascular: Normal rate and regular rhythm.   No murmur heard. Respiratory: Effort normal and breath sounds normal.  GI: Soft. Bowel sounds are  normal.  Musculoskeletal: Normal range of motion.  +pulse in graft distally  Left foot partial amputation  Neurological: He is alert and oriented to person, place, and time.  Skin: Skin is warm and dry.  Psychiatric: He has a normal mood and affect. His behavior is normal. Judgment and thought content normal.     Assessment/Plan Clotted L arm dialysis graft Scheduled for thrombolysis and poss pta/stent Possible catheter if needed Pt aware of procedure benefits and risks and agreeable to proceed Consent signed and in chart  [place] A 04/06/2013, 10:25 AM

## 2013-04-07 ENCOUNTER — Ambulatory Visit: Payer: Medicare Other | Admitting: Cardiology

## 2013-04-07 LAB — GLUCOSE, CAPILLARY
GLUCOSE-CAPILLARY: 87 mg/dL (ref 70–99)
GLUCOSE-CAPILLARY: 121 mg/dL — AB (ref 70–99)

## 2013-04-07 MED ORDER — ALBUTEROL SULFATE (2.5 MG/3ML) 0.083% IN NEBU: 2.5000 mg | INHALATION_SOLUTION | Freq: Four times a day (QID) | RESPIRATORY_TRACT | Status: DC | PRN

## 2013-04-07 MED ORDER — CINACALCET HCL 30 MG PO TABS: 30.0000 mg | ORAL_TABLET | Freq: Every day | ORAL | Status: DC

## 2013-04-07 MED ORDER — LEVOFLOXACIN 500 MG PO TABS: 500.0000 mg | ORAL_TABLET | ORAL | Status: DC

## 2013-04-07 MED ORDER — BUDESONIDE-FORMOTEROL FUMARATE 80-4.5 MCG/ACT IN AERO: 2.0000 | INHALATION_SPRAY | Freq: Two times a day (BID) | RESPIRATORY_TRACT | Status: DC

## 2013-04-07 MED ORDER — PREDNISONE 20 MG PO TABS: ORAL_TABLET | ORAL | Status: DC

## 2013-04-07 NOTE — Discharge Summary (Signed)
[facility]  Physician Discharge Summary   Patient ID: [name] MRN: [id number] DOB/AGE: [birth year] 63 y.o.  Admit date: 04/01/2013 Discharge date: 04/07/2013  PCP: Redge Gainer, MD  DISCHARGE DIAGNOSES:  Active Problems:   Chronic diastolic CHF (congestive heart failure)   ESRD on hemodialysis   Hypotension   COPD (chronic obstructive pulmonary disease)   HCAP (healthcare-associated pneumonia)   Mediastinal lymphadenopathy   RECOMMENDATIONS FOR OUTPATIENT FOLLOW UP: 1. Needs CXR in 2 weeks 2. Needs PFT as OP if one hasn't been done recently.  DISCHARGE CONDITION: fair  Diet recommendation: Low Sodium  Filed Weights   04/06/13 0500 04/06/13 1749 04/07/13 0500  Weight: 57.2 kg (126 lb 1.7 oz) 57 kg (125 lb 10.6 oz) 57.425 kg (126 lb 9.6 oz)    INITIAL HISTORY: Pt is a 63 y.o. 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 Chest x-ray showed persistent interstitial edema unchanged compared to prior exam (03/30/13), and persistent bilateral moderate pleural effusions with consolidation of both lungs. He was hypoxic in the 70s initially, and was wheezing on exam and treated with a continuous nebs, was placed on 5 L nasal cannula and O2 sats improved. Pulmonary was consulted regarding bilateral pleural effusions (reported to be loculated on CT) and per PCCM more likely to secondary to CHF and no further eval recommended.  Consultations:  Nephrology  Pulmonology  IR  Procedures: 2D Echo Study Conclusions - Left ventricle: The cavity size was normal. Wall thickness was increased in a pattern of mild LVH. Systolic function was mildly reduced. The estimated ejection fraction was in the range of 45% to 50%. Wall motion was normal; there were no regional wall motion abnormalities. Doppler parameters are consistent with abnormal left  ventricular relaxation (grade 1 diastolic dysfunction). - Aortic valve: Valve area: 1.73cm^2(VTI). Valve area: 1.48cm^2 (Vmax). - Left atrium: The atrium was mildly dilated. - Right atrium: The atrium was mildly dilated. - Atrial septum: No defect or patent foramen ovale was identified.  Declotting of Fistula 1/14  HOSPITAL COURSE:   . Bilateral loculated pleural effusion  CT with contrast shows left greater than right loculated effusions. It was also noted that CT of his chest in November showed moderate L. pleural effusion. Patient was seen by pulmonology and they felt that the effusions were likely secondary to CHF. 2-D echo shows EF of 45-50% with grade 1 diastolic dysfunction. They did not feel that thoracentesis was necessary. With dialysis the effusions should resolve.  Marland KitchenCOPD with acute exacerbation  He was noted to be wheezing on admission. He was placed on nebulized bronchodilators, prednisone and mucolytics. Steroids will be tapered. He will be initiated on inhaled steroids. Continue Levaquin for few more days. He is on home oxygen per patient. He is much better.  . Acute on chronic diastolic & systolic CHF +/-HCAP (healthcare-associated pneumonia)  He was dialysed for this issue. His breathing has improved. It remained unclear if HCAP was present or not.  . Acute on chronic respiratory failure  Secondary to above see treatment as discussed   . End-stage renal disease on dialysis  He was dialysed per his MWF schedule. His access did clot and it was declotted by IR on 1/14. He was then dialysed without difficulty on 1/14.   Marland Kitchen GERD  Continue PPI   . History of CVA  Stable. Ambulatory per patient.   Marland Kitchen  Hypotension  Likely related to volume pulled off at dialysis. He is completely asymptomatic with these low pressures. He will be ambulated and if he tolerates he should be able to go home. Discussed with Dr. Justin Mend and he feels that patient may be used to a low BP. Patient is  agreeable with the plan.   Overall patient is much better. If he is able to ambulate he should be able to go home later today.   PERTINENT LABS:  The results of significant diagnostics from this hospitalization (including imaging, microbiology, ancillary and laboratory) are listed below for reference.    Microbiology: Recent Results (from the past 240 hour(s))  MRSA PCR SCREENING     Status: None   Collection Time    04/01/13 12:04 PM      Result Value Range Status   MRSA by PCR NEGATIVE  NEGATIVE Final   Comment:            The GeneXpert MRSA Assay (FDA     approved for NASAL specimens     only), is one component of a     comprehensive MRSA colonization     surveillance program. It is not     intended to diagnose MRSA     infection nor to guide or     monitor treatment for     MRSA infections.     Labs: Basic Metabolic Panel:  Recent Labs Lab 04/01/13 1313 04/02/13 0330 04/03/13 0300 04/05/13 0050 04/06/13 0215 04/06/13 1756  NA  --  141 138 138 137 135*  K  --  4.4 3.8 3.7 4.1 4.1  CL  --  99 93* 93* 93* 92*  CO2  --  28 30 31 28 27   GLUCOSE  --  120* 119* 125* 96 136*  BUN  --  13 14 25* 42* 51*  CREATININE  --  2.92* 2.52* 3.93* 5.87* 6.80*  CALCIUM  --  9.9 9.8 9.9 10.1 9.6  MG  --   --   --  2.1  --   --   PHOS 3.8  --   --   --   --  3.5   Liver Function Tests:  Recent Labs Lab 04/06/13 1756  ALBUMIN 2.8*    Recent Labs Lab 04/05/13 0045  LIPASE 20   CBC:  Recent Labs Lab 04/01/13 0741 04/01/13 0754 04/03/13 0300 04/06/13 0215 04/06/13 1756  WBC 6.0  --  5.6 6.7 7.3  NEUTROABS 4.6  --   --   --   --   HGB 10.9* 13.3 11.2* 10.9* 10.5*  HCT 36.0* 39.0 38.3* 36.8* 34.7*  MCV 86.5  --  87.4 87.4 86.8  PLT 137*  --  215 234 266   Cardiac Enzymes:  Recent Labs Lab 04/02/13 0200 04/02/13 0735 04/05/13 0050 04/05/13 0320 04/05/13 1210  TROPONINI <0.30 <0.30 <0.30 <0.30 <0.30   BNP: BNP (last 3 results)  Recent Labs   12/25/12 1300 02/08/13 1903 04/01/13 2030  PROBNP 32295.0* 27711.0* 48968.0*   CBG:  Recent Labs Lab 04/06/13 0816 04/06/13 0851 04/06/13 1148 04/06/13 2303 04/07/13 0815  GLUCAP 53* 118* 84 95 87     IMAGING STUDIES Ct Chest W Contrast  04/02/2013   CLINICAL DATA:  63 year old male with shortness of breath. Loculated bilateral pleural effusions. History of COPD, CHF and end-stage renal disease.  EXAM: CT CHEST WITH CONTRAST  TECHNIQUE: Multidetector CT imaging of the chest was performed during intravenous contrast administration.  CONTRAST:  75mL OMNIPAQUE IOHEXOL 300 MG/ML  SOLN  COMPARISON:  04/02/2013 and prior chest radiographs. 12/27/2012 and prior chest CTs.  FINDINGS: Cardiomegaly, heavy coronary artery calcifications and vascular calcifications are again noted. Calcification along the left ventricular wall again noted.  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  Bilateral loculated pleural effusions are slightly decreased in size since 12/27/2012, moderate-large on the left and small-moderate on the right. Mild pleural enhancement is again identified without definite pleural nodularity or masses.  There is no evidence of pericardial effusion.  Stable enlarged hilar and subcarinal lymph nodes are identified with index nodes as follows:  A 2 cm right hilar node (image 31).  A 2.2 x 2.3 cm subcarinal node (image 26).  Moderate atelectasis within both lower lungs again noted.  A focal area of ground-glass opacity within the anterior right upper lobe may represent pneumonia/pneumonitis.  Bilateral renal atrophy, splenomegaly and chronic changes involving both kidneys again noted.  No acute or suspicious bony abnormalities are identified.  IMPRESSION: Slightly smaller bilateral loculated pleural effusions, moderate-large on the left and small-moderate on the right, with stable apparent pleural enhancement - no pleural  nodularity/masses.  Focal right upper lobe ground-glass opacity - question pneumonia/pneumonitis.  Unchanged moderate bilateral lower lung atelectasis, hilar/mediastinal lymphadenopathy, cardiomegaly and coronary artery disease.   Electronically Signed   By: Hassan Rowan M.D.   On: 04/02/2013 13:16   Dg Chest Port 1 View  04/02/2013   CLINICAL DATA:  Cough, COPD and infiltrates.  EXAM: PORTABLE CHEST - 1 VIEW  COMPARISON:  04/01/2013  FINDINGS: Lung volumes are slightly lower bilaterally compared to the prior exam. There remains evidence of bilateral lower lobe airspace disease suspicious for pneumonia with associated interstitial edema and bilateral pleural effusions. The pleural effusions appear at least partially loculated bilaterally. Component of empyema cannot be excluded. The heart size is stable and mildly enlarged.  IMPRESSION: Lower bilateral lung volumes with persistent bilateral lower lobe airspace disease, interstitial edema and bilateral loculated pleural fluid. Presence of empyema cannot be excluded.   Electronically Signed   By: Aletta Edouard M.D.   On: 04/02/2013 08:23   Dg Chest Port 1 View  04/01/2013   CLINICAL DATA:  Shortness of breath and cough  EXAM: PORTABLE CHEST - 1 VIEW  COMPARISON:  March 30, 2013  FINDINGS: The heart size and mediastinal contours are stable. The heart size is enlarged. There are bilateral pleural effusions unchanged. Consolidation of the bilateral mid and lung bases are unchanged. There is increased diffuse pulmonary interstitium bilaterally unchanged. The visualized skeletal structures are stable.  IMPRESSION: Persistent interstitial edema unchanged compared to prior exam. Persists in bilateral moderate pleural effusions with consolidation of both lungs unchanged.   Electronically Signed   By: Abelardo Diesel M.D.   On: 04/01/2013 07:44   Dg Abd Portable 1v  04/05/2013   CLINICAL DATA:  Mid abdominal pain with nausea and vomiting.  EXAM: PORTABLE ABDOMEN - 1 VIEW   COMPARISON:  Chest CT scanogram 04/02/2013  FINDINGS: No evidence of bowel obstruction. Left-sided calcified renal masses and diffuse arterial calcification are grossly stable from prior. No acute osseous findings.  IMPRESSION: No evidence of bowel obstruction.   Electronically Signed   By: Jorje Guild M.D.   On: 04/05/2013 01:54    DISCHARGE EXAMINATION: Filed Vitals:   04/07/13 0030 04/07/13 0400 04/07/13 0500 04/07/13 0800  BP: 131/46 80/15  78/47  Pulse:  72  65  Temp:  98.5 F (36.9 C)  98 F (36.7 C)  TempSrc:  Oral  Oral  Resp: 24 18  17   Height:      Weight:   57.425 kg (126 lb 9.6 oz)   SpO2: 95% 94%  95%   General appearance: alert, cooperative, appears stated age and no distress Resp: decreased air entry at bases. Few crackles but no wheezing. Cardio: regular rate and rhythm, S1, S2 normal, no murmur, click, rub or gallop GI: soft, non-tender; bowel sounds normal; no masses,  no organomegaly  DISPOSITION: Home with family   Future Appointments Provider Department Dept Phone   04/26/2013 1:00 PM Arnoldo Lenis, MD [place] [phone]   05/09/2013 1:00 PM [facility] [place] [facility] [address] [phone]   05/09/2013 1:30 PM [facility]  [facility] [address] [phone]   05/09/2013 2:40 PM Sharmon Leyden Nickel, NP [facility] [phone]      ALLERGIES:  Allergies  Allergen Reactions  . Ambien [Zolpidem Tartrate] Other (See Comments)    Hallucinations    Current Discharge Medication List    START taking these medications   Details  albuterol (PROVENTIL) (2.5 MG/3ML) 0.083% nebulizer solution Take 3 mLs (2.5 mg total) by nebulization every 6 (six) hours as needed for wheezing or shortness of breath. Qty: 75 mL, Refills: 1    budesonide-formoterol (SYMBICORT) 80-4.5 MCG/ACT inhaler Inhale 2 puffs into the lungs 2 (two) times daily. Qty: 1 Inhaler, Refills: 12     cinacalcet (SENSIPAR) 30 MG tablet Take 1 tablet (30 mg total) by mouth daily with breakfast. Qty: 30 tablet, Refills: 1    levofloxacin (LEVAQUIN) 500 MG tablet Take 1 tablet (500 mg total) by mouth every other day. For 3 more doses Qty: 3 tablet, Refills: 0    predniSONE (DELTASONE) 20 MG tablet Take 3 tabs once daily for 3 days, then take 2 tablets once daily for 3 days, then take 1 tablet once daily for 3 days and then STOP. Qty: 18 tablet, Refills: 0      CONTINUE these medications which have NOT CHANGED   Details  ALPRAZolam (XANAX) 1 MG tablet Take 1 tablet (1 mg total) by mouth 2 (two) times daily as needed for anxiety or sleep. Qty: 60 tablet, Refills: 2   Associated Diagnoses: Insomnia; Generalized anxiety disorder    calcium acetate (PHOSLO) 667 MG capsule Take 1,334 mg by mouth 3 (three) times daily with meals.     DULoxetine (CYMBALTA) 60 MG capsule Take 60 mg by mouth daily.    folic acid (FOLVITE) 1 MG tablet Take 1 mg by mouth daily.    gabapentin (NEURONTIN) 300 MG capsule Take 1 capsule (300 mg total) by mouth 3 (three) times daily. Qty: 90 capsule, Refills: 11   Associated Diagnoses: Unspecified hereditary and idiopathic peripheral neuropathy    multivitamin (RENA-VIT) TABS tablet Take 1 tablet by mouth daily.    omeprazole (PRILOSEC) 20 MG capsule Take 20 mg by mouth daily.    oxyCODONE-acetaminophen (PERCOCET) 7.5-325 MG per tablet Take 1 tablet by mouth every 8 (eight) hours as needed for pain. Qty: 20 tablet, Refills: 0    PROAIR HFA 108 (90 BASE) MCG/ACT inhaler Inhale 1 puff into the lungs every 4 (four) hours as needed for wheezing.     tiZANidine (ZANAFLEX) 4 MG tablet Take 4 mg by mouth every 6 (six) hours as needed for muscle spasms.  Follow-up Information   Follow up with Redge Gainer, MD In 2 weeks.   Specialty:  Family Medicine   Contact information:   [address] [place] [address] [phone]       TOTAL DISCHARGE  TIME: 42 mins  [facility] Pager [phone]  04/07/2013, 10:57 AM

## 2013-04-07 NOTE — Progress Notes (Signed)
Reviewed AVS with patient and his caregiver. New Rx's given to patient with a copy of his AVS. Patient and caregiver denied any further questions at this time.  Jess Barters, RN

## 2013-04-07 NOTE — Progress Notes (Signed)
[facility] ROUNDING NOTE   Subjective:   Interval History: none.  Objective:  Vital signs in last 24 hours:  Temp:  [98 F (36.7 C)-98.6 F (37 C)] 98 F (36.7 C) (01/15 0800) Pulse Rate:  [65-72] 65 (01/15 0800) Resp:  [15-24] 17 (01/15 0800) BP: (50-131)/(15-69) 78/47 mmHg (01/15 0800) SpO2:  [91 %-100 %] 95 % (01/15 0800) Weight:  [57 kg (125 lb 10.6 oz)-57.425 kg (126 lb 9.6 oz)] 57.425 kg (126 lb 9.6 oz) (01/15 0500)  Weight change: -0.2 kg (-7.1 oz) Filed Weights   04/06/13 0500 04/06/13 1749 04/07/13 0500  Weight: 57.2 kg (126 lb 1.7 oz) 57 kg (125 lb 10.6 oz) 57.425 kg (126 lb 9.6 oz)    Intake/Output: I/O last 3 completed shifts: In: 82 [P.O.:480; I.V.:6] Out: 900 [Other:900]   Intake/Output this shift:  Total I/O In: 120 [P.O.:120] Out: -   CVS:RRR  Resp:decreased BS bases R>l. Wheezing better  Abd:+ BS NTND  Ext: No edema L AVG + bruit  NEURO:CNI Ox3 no asterixis    Basic Metabolic Panel:  Recent Labs Lab 04/01/13 1313  04/02/13 0330 04/03/13 0300 04/05/13 0050 04/06/13 0215 04/06/13 1756  NA  --   --  141 138 138 137 135*  K  --   --  4.4 3.8 3.7 4.1 4.1  CL  --   --  99 93* 93* 93* 92*  CO2  --   --  28 30 31 28 27   GLUCOSE  --   --  120* 119* 125* 96 136*  BUN  --   --  13 14 25* 42* 51*  CREATININE  --   --  2.92* 2.52* 3.93* 5.87* 6.80*  CALCIUM  --   < > 9.9 9.8 9.9 10.1 9.6  MG  --   --   --   --  2.1  --   --   PHOS 3.8  --   --   --   --   --  3.5  < > = values in this interval not displayed.  Liver Function Tests:  Recent Labs Lab 04/06/13 1756  ALBUMIN 2.8*    Recent Labs Lab 04/05/13 0045  LIPASE 20   No results found for this basename: AMMONIA,  in the last 168 hours  CBC:  Recent Labs Lab 04/01/13 0741 04/01/13 0754 04/03/13 0300 04/06/13 0215 04/06/13 1756  WBC 6.0  --  5.6 6.7 7.3  NEUTROABS 4.6  --   --   --   --   HGB 10.9* 13.3 11.2* 10.9* 10.5*  HCT 36.0* 39.0 38.3* 36.8* 34.7*   MCV 86.5  --  87.4 87.4 86.8  PLT 137*  --  215 234 266    Cardiac Enzymes:  Recent Labs Lab 04/02/13 0200 04/02/13 0735 04/05/13 0050 04/05/13 0320 04/05/13 1210  TROPONINI <0.30 <0.30 <0.30 <0.30 <0.30    BNP: No components found with this basename: POCBNP,   CBG:  Recent Labs Lab 04/06/13 0816 04/06/13 0851 04/06/13 1148 04/06/13 2303 04/07/13 0815  GLUCAP 53* 118* 84 95 87    Microbiology: Results for orders placed during the hospital encounter of 04/01/13  MRSA PCR SCREENING     Status: None   Collection Time    04/01/13 12:04 PM      Result Value Range Status   MRSA by PCR NEGATIVE  NEGATIVE Final   Comment:            The GeneXpert MRSA Assay (  FDA     approved for NASAL specimens     only), is one component of a     comprehensive MRSA colonization     surveillance program. It is not     intended to diagnose MRSA     infection nor to guide or     monitor treatment for     MRSA infections.    Coagulation Studies: No results found for this basename: LABPROT, INR,  in the last 72 hours  Urinalysis: No results found for this basename: COLORURINE, APPERANCEUR, LABSPEC, PHURINE, GLUCOSEU, HGBUR, BILIRUBINUR, KETONESUR, PROTEINUR, UROBILINOGEN, NITRITE, LEUKOCYTESUR,  in the last 72 hours    Imaging: No results found.   Medications:     . calcium acetate  2,001 mg Oral TID WC  . cinacalcet  30 mg Oral Q breakfast  . doxercalciferol  2.5 mcg Intravenous Q M,W,F-HD  . DULoxetine  60 mg Oral Daily  . feeding supplement (NEPRO CARB STEADY)  237 mL Oral BID BM  . folic acid  1 mg Oral Daily  . gabapentin  300 mg Oral BID  . guaiFENesin  600 mg Oral BID  . heparin subcutaneous  5,000 Units Subcutaneous Q8H  .  HYDROmorphone (DILAUDID) injection  0.5 mg Intravenous Once  . ipratropium-albuterol  3 mL Nebulization BID  . levofloxacin  500 mg Oral Q48H  . multivitamin  1 tablet Oral QHS  . pantoprazole  40 mg Oral Daily  . predniSONE  60 mg Oral Q  breakfast  . sodium chloride  3 mL Intravenous Q12H   sodium chloride, acetaminophen, acetaminophen, albuterol, ALPRAZolam, ondansetron (ZOFRAN) IV, ondansetron, oxyCODONE, oxyCODONE-acetaminophen, sodium chloride, tiZANidine  Assessment/ Plan:  Pt is a 63 y.o. 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 Declotted AVG 1/14 and dialysis 1/14  1.Dyspnea        pulmonary edema  Responsible resolved  patient improved 2. HTN       controlled  3.ESRD      MWF    [name]  4. Sec HPTH on      hectorol   4 mcg  , sensipar 30mg       Calcium and phosphorus controlled    5. Mild anemia, not on EPO  6.Clotted AVG Arrange declot    LOS: 6 Latash Nouri W @TODAY @8 :45 AM

## 2013-04-12 ENCOUNTER — Telehealth: Payer: Self-pay | Admitting: General Practice

## 2013-04-12 NOTE — Telephone Encounter (Signed)
appt made for wed for hospital f/u

## 2013-04-13 ENCOUNTER — Ambulatory Visit (INDEPENDENT_AMBULATORY_CARE_PROVIDER_SITE_OTHER): Payer: Medicare Other | Admitting: General Practice

## 2013-04-13 ENCOUNTER — Ambulatory Visit (INDEPENDENT_AMBULATORY_CARE_PROVIDER_SITE_OTHER): Payer: Medicare Other

## 2013-04-13 ENCOUNTER — Encounter: Payer: Self-pay | Admitting: General Practice

## 2013-04-13 ENCOUNTER — Ambulatory Visit: Payer: Medicare Other | Admitting: Cardiology

## 2013-04-13 VITALS — BP 91/44 | HR 92 | Temp 97.2°F | Ht 66.0 in | Wt 143.5 lb

## 2013-04-13 DIAGNOSIS — R05 Cough: Secondary | ICD-10-CM

## 2013-04-13 DIAGNOSIS — Z09 Encounter for follow-up examination after completed treatment for conditions other than malignant neoplasm: Secondary | ICD-10-CM

## 2013-04-13 DIAGNOSIS — J449 Chronic obstructive pulmonary disease, unspecified: Secondary | ICD-10-CM

## 2013-04-13 DIAGNOSIS — R059 Cough, unspecified: Secondary | ICD-10-CM

## 2013-04-13 DIAGNOSIS — N186 End stage renal disease: Secondary | ICD-10-CM

## 2013-04-13 DIAGNOSIS — I509 Heart failure, unspecified: Secondary | ICD-10-CM

## 2013-04-13 DIAGNOSIS — I2510 Atherosclerotic heart disease of native coronary artery without angina pectoris: Secondary | ICD-10-CM

## 2013-04-13 DIAGNOSIS — I739 Peripheral vascular disease, unspecified: Secondary | ICD-10-CM

## 2013-04-13 DIAGNOSIS — Z992 Dependence on renal dialysis: Secondary | ICD-10-CM

## 2013-04-13 MED ORDER — BENZONATATE 100 MG PO CAPS: 100.0000 mg | ORAL_CAPSULE | Freq: Three times a day (TID) | ORAL | Status: DC | PRN

## 2013-04-13 NOTE — Patient Instructions (Addendum)
Smoking Cessation Quitting smoking is important to your health and has many advantages. However, it is not always easy to quit since nicotine is a very addictive drug. Often times, people try 3 times or more before being able to quit. This document explains the best ways for you to prepare to quit smoking. Quitting takes hard work and a lot of effort, but you can do it. ADVANTAGES OF QUITTING SMOKING  You will live longer, feel better, and live better.  Your body will feel the impact of quitting smoking almost immediately.  Within 20 minutes, blood pressure decreases. Your pulse returns to its normal level.  After 8 hours, carbon monoxide levels in the blood return to normal. Your oxygen level increases.  After 24 hours, the chance of having a heart attack starts to decrease. Your breath, hair, and body stop smelling like smoke.  After 48 hours, damaged nerve endings begin to recover. Your sense of taste and smell improve.  After 72 hours, the body is virtually free of nicotine. Your bronchial tubes relax and breathing becomes easier.  After 2 to 12 weeks, lungs can hold more air. Exercise becomes easier and circulation improves.  The risk of having a heart attack, stroke, cancer, or lung disease is greatly reduced.  After 1 year, the risk of coronary heart disease is cut in half.  After 5 years, the risk of stroke falls to the same as a nonsmoker.  After 10 years, the risk of lung cancer is cut in half and the risk of other cancers decreases significantly.  After 15 years, the risk of coronary heart disease drops, usually to the level of a nonsmoker.  If you are pregnant, quitting smoking will improve your chances of having a healthy baby.  The people you live with, especially any children, will be healthier.  You will have extra money to spend on things other than cigarettes. QUESTIONS TO THINK ABOUT BEFORE ATTEMPTING TO QUIT You may want to talk about your answers with your  caregiver.  Why do you want to quit?  If you tried to quit in the past, what helped and what did not?  What will be the most difficult situations for you after you quit? How will you plan to handle them?  Who can help you through the tough times? Your family? Friends? A caregiver?  What pleasures do you get from smoking? What ways can you still get pleasure if you quit? Here are some questions to ask your caregiver:  How can you help me to be successful at quitting?  What medicine do you think would be best for me and how should I take it?  What should I do if I need more help?  What is smoking withdrawal like? How can I get information on withdrawal? GET READY  Set a quit date.  Change your environment by getting rid of all cigarettes, ashtrays, matches, and lighters in your home, car, or work. Do not let people smoke in your home.  Review your past attempts to quit. Think about what worked and what did not. GET SUPPORT AND ENCOURAGEMENT You have a better chance of being successful if you have help. You can get support in many ways.  Tell your family, friends, and co-workers that you are going to quit and need their support. Ask them not to smoke around you.  Get individual, group, or telephone counseling and support. Programs are available at local hospitals and health centers. Call your local health department for   information about programs in your area.  Spiritual beliefs and practices may help some smokers quit.  Download a "quit meter" on your computer to keep track of quit statistics, such as how long you have gone without smoking, cigarettes not smoked, and money saved.  Get a self-help book about quitting smoking and staying off of tobacco. LEARN NEW SKILLS AND BEHAVIORS  Distract yourself from urges to smoke. Talk to someone, go for a walk, or occupy your time with a task.  Change your normal routine. Take a different route to work. Drink tea instead of coffee.  Eat breakfast in a different place.  Reduce your stress. Take a hot bath, exercise, or read a book.  Plan something enjoyable to do every day. Reward yourself for not smoking.  Explore interactive web-based programs that specialize in helping you quit. GET MEDICINE AND USE IT CORRECTLY Medicines can help you stop smoking and decrease the urge to smoke. Combining medicine with the above behavioral methods and support can greatly increase your chances of successfully quitting smoking.  Nicotine replacement therapy helps deliver nicotine to your body without the negative effects and risks of smoking. Nicotine replacement therapy includes nicotine gum, lozenges, inhalers, nasal sprays, and skin patches. Some may be available over-the-counter and others require a prescription.  Antidepressant medicine helps people abstain from smoking, but how this works is unknown. This medicine is available by prescription.  Nicotinic receptor partial agonist medicine simulates the effect of nicotine in your brain. This medicine is available by prescription. Ask your caregiver for advice about which medicines to use and how to use them based on your health history. Your caregiver will tell you what side effects to look out for if you choose to be on a medicine or therapy. Carefully read the information on the package. Do not use any other product containing nicotine while using a nicotine replacement product.  RELAPSE OR DIFFICULT SITUATIONS Most relapses occur within the first 3 months after quitting. Do not be discouraged if you start smoking again. Remember, most people try several times before finally quitting. You may have symptoms of withdrawal because your body is used to nicotine. You may crave cigarettes, be irritable, feel very hungry, cough often, get headaches, or have difficulty concentrating. The withdrawal symptoms are only temporary. They are strongest when you first quit, but they will go away within  10 14 days. To reduce the chances of relapse, try to:  Avoid drinking alcohol. Drinking lowers your chances of successfully quitting.  Reduce the amount of caffeine you consume. Once you quit smoking, the amount of caffeine in your body increases and can give you symptoms, such as a rapid heartbeat, sweating, and anxiety.  Avoid smokers because they can make you want to smoke.  Do not let weight gain distract you. Many smokers will gain weight when they quit, usually less than 10 pounds. Eat a healthy diet and stay active. You can always lose the weight gained after you quit.  Find ways to improve your mood other than smoking. FOR MORE INFORMATION  www.smokefree.gov  Document Released: 03/04/2001 Document Revised: 09/09/2011 Document Reviewed: 06/19/2011 [employer] Patient Information 2014 [employer].  

## 2013-04-13 NOTE — Progress Notes (Signed)
   Subjective:    Patient ID: [name], male    DOB: 11-12-1950, 63 y.o.   MRN: [id number]  HPI Patient presents today for hospital follow up. He was reports having shortness of breath, prior to hospital admission. He reports taking medications as prescribed, since discharge and denies similar symptoms at this time. Reports feeling better. Reports he had decreased cigarette smoking, but has since then increased number per day.     Review of Systems  Constitutional: Negative for fever and chills.  Respiratory: Positive for shortness of breath. Negative for chest tightness and wheezing.        Shortness of breath upon exertion  Cardiovascular: Negative for chest pain and palpitations.  Gastrointestinal: Negative for nausea, vomiting, abdominal pain, diarrhea, constipation and blood in stool.  Genitourinary:       Dialysis three days week  Neurological: Negative for dizziness, weakness and headaches.       Objective:   Physical Exam  Constitutional: He is oriented to person, place, and time. He appears well-developed and well-nourished.  Neck: Normal range of motion. Neck supple. No thyromegaly present.  Cardiovascular: Normal rate, regular rhythm and normal heart sounds.   No murmur heard. Pulmonary/Chest: Effort normal. No respiratory distress. He exhibits no tenderness.  Diminished breath sounds throughout  Abdominal: Soft. Bowel sounds are normal. He exhibits no distension. There is no tenderness.  Lymphadenopathy:    He has no cervical adenopathy.  Neurological: He is alert and oriented to person, place, and time.  Skin: Skin is warm and dry.  Psychiatric: He has a normal mood and affect.     [facility] reading (PRIMARY) by Erby Pian, FNP-C, no acute change.      Assessment & Plan:  1. Cough  - benzonatate (TESSALON) 100 MG capsule; Take 1 capsule (100 mg total) by mouth 3 (three) times daily as needed.  Dispense: 30 capsule; Refill: 0 - DG Chest 2 View;  Future  2. Hospital discharge follow-up  - DG Chest 2 View; Future  3. CAD (coronary artery disease)   4. CHF (congestive heart failure)   5. PVD (peripheral vascular disease)   6. COPD (chronic obstructive pulmonary disease)   7. ESRD on hemodialysis Continue all current medications Labs drawn at hemodialysis Discussed benefits healthy eating and adhering to fluid restriction Discussed smoking cessation  Patient verbalized understanding Erby Pian, FNP-C

## 2013-04-18 ENCOUNTER — Other Ambulatory Visit: Payer: Self-pay | Admitting: General Practice

## 2013-04-18 ENCOUNTER — Telehealth: Payer: Self-pay | Admitting: General Practice

## 2013-04-18 DIAGNOSIS — J449 Chronic obstructive pulmonary disease, unspecified: Secondary | ICD-10-CM

## 2013-04-18 MED ORDER — ALBUTEROL SULFATE (2.5 MG/3ML) 0.083% IN NEBU: 2.5000 mg | INHALATION_SOLUTION | Freq: Four times a day (QID) | RESPIRATORY_TRACT | Status: DC | PRN

## 2013-04-18 NOTE — Telephone Encounter (Signed)
Please inform script ready for pick up, nebulizer machine.

## 2013-04-18 NOTE — Telephone Encounter (Signed)
Need order

## 2013-04-19 ENCOUNTER — Telehealth: Payer: Self-pay | Admitting: General Practice

## 2013-04-19 NOTE — Telephone Encounter (Signed)
Pt.notified

## 2013-04-21 IMAGING — CR DG ABDOMEN 1V
1 series · 1 of 1 positions shown · non-contrast
Comparison: Plain film the abdomen 08/16/2011 and CT abdomen and
pelvis 08/13/2011.

CLINICAL DATA: Abdominal distention.

ABDOMEN - 1 VIEW

[t abdomen supine]
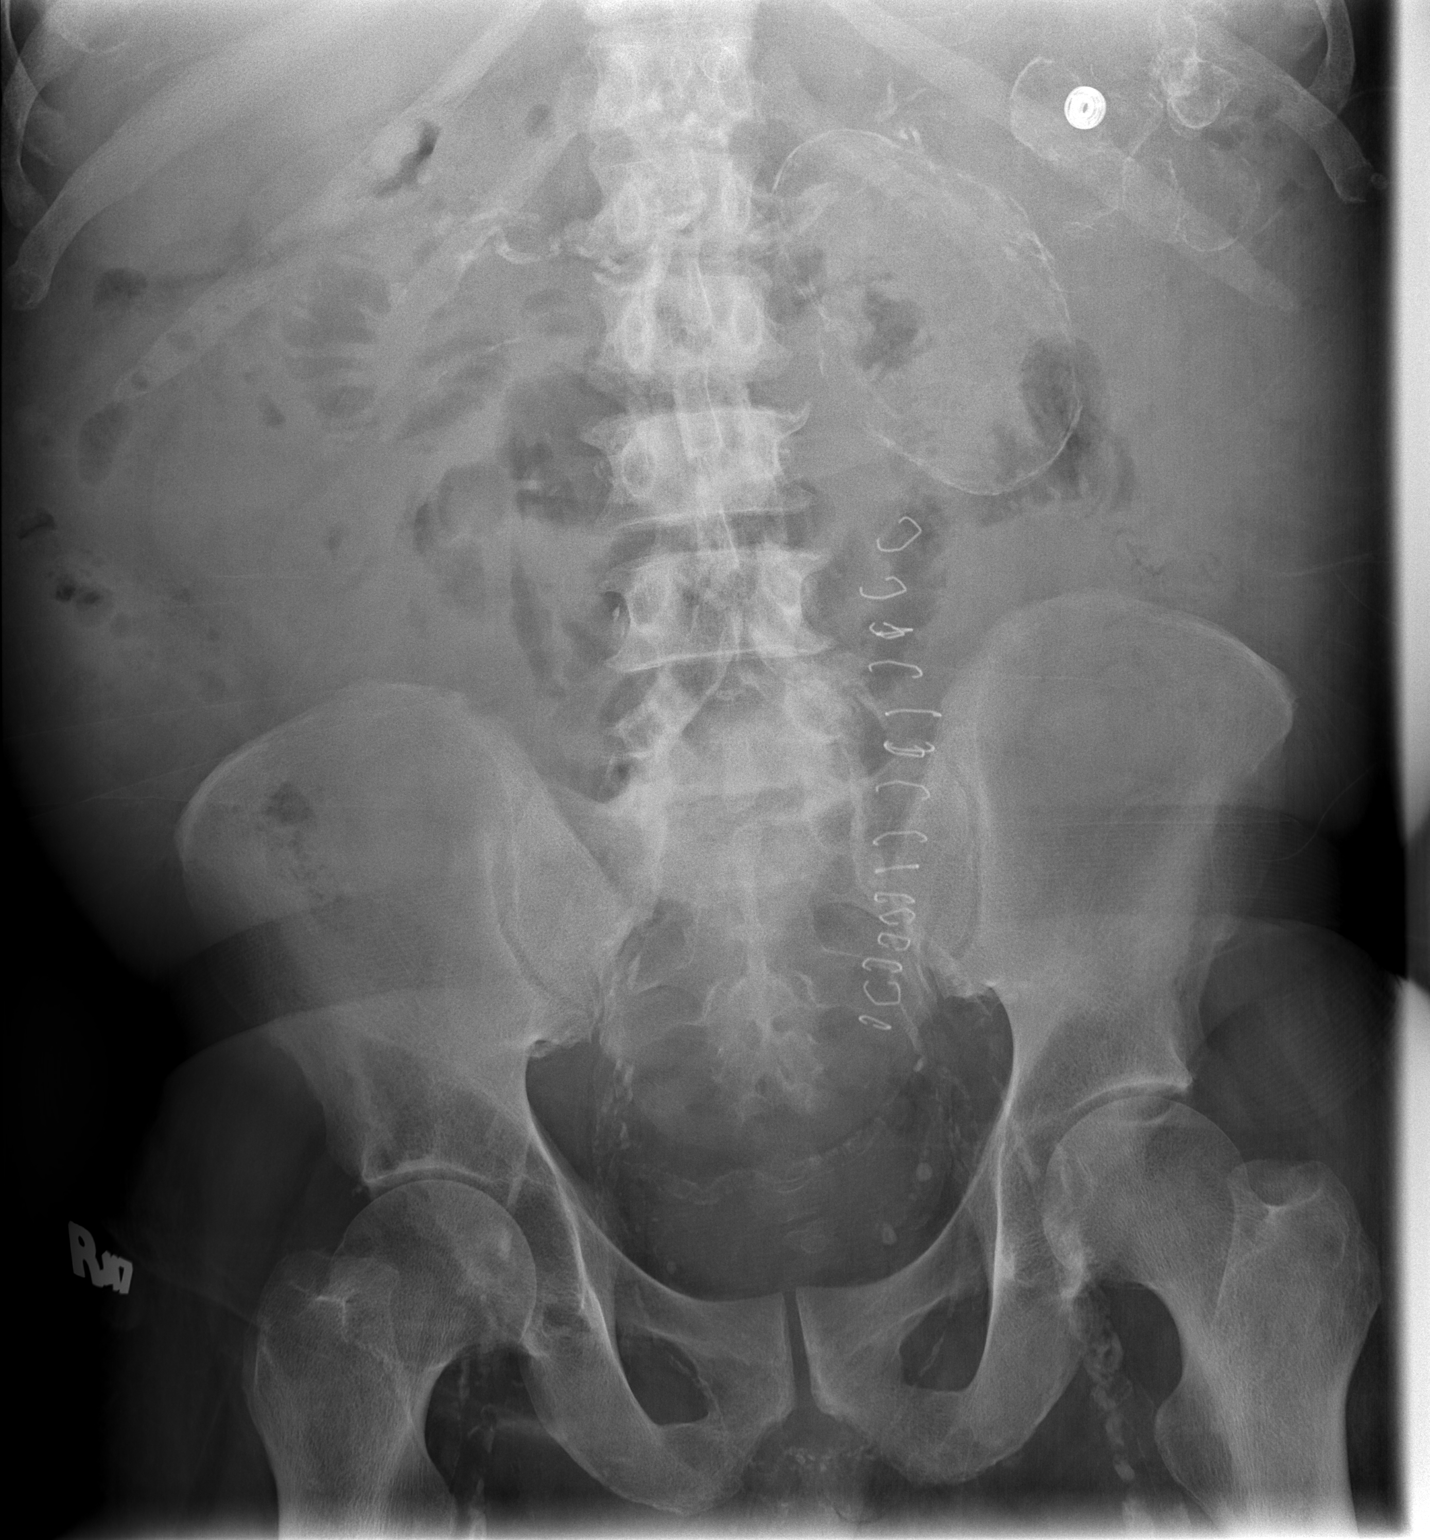

[1 of 1 positions shown; findings below may reference images not displayed]

FINDINGS: There are some scattered gas filled but nondilated loops
of small bowel.  Small amount of gas is seen in the ascending
colon.  NG tube is no longer visualized.  Calcified cystic lesion
in the left upper quadrant as seen on CT noted.  Atherosclerotic
vascular disease is also noted.
IMPRESSION: Some increase in gas filled but nondilated loops of small bowel
could be due to progressive small bowel obstruction.

## 2013-04-26 ENCOUNTER — Encounter: Payer: Medicare Other | Admitting: Cardiology

## 2013-04-26 NOTE — Progress Notes (Signed)
ERROR

## 2013-04-29 IMAGING — CR DG CHEST 1V PORT
1 series · 1 of 1 positions shown · non-contrast
Comparison: Portable chest 08/27/2011.

CLINICAL DATA: Intubated patient.

PORTABLE CHEST - 1 VIEW

[AP]
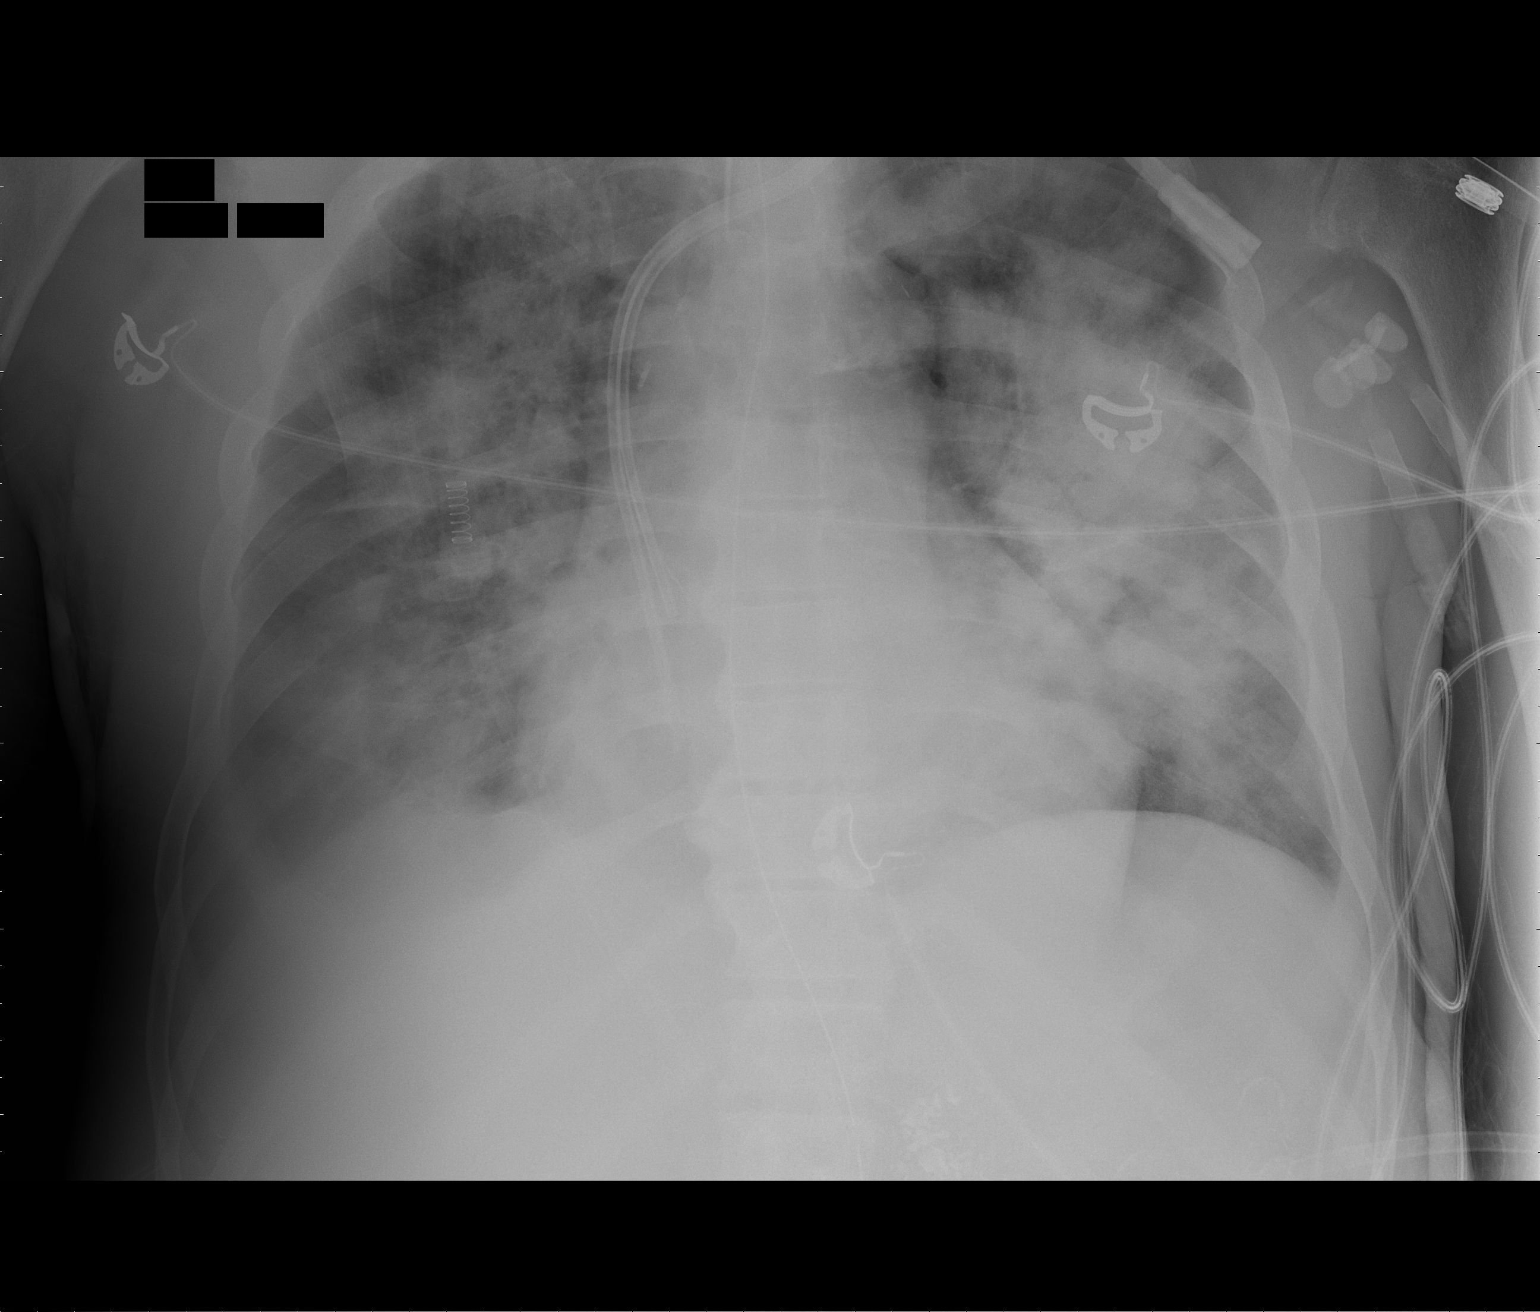

[1 of 1 positions shown; findings below may reference images not displayed]

FINDINGS: Support tubes and lines are unchanged.  Extensive
bilateral airspace disease persists.  Aeration in the right chest
has worsened.  Right greater than left pleural effusions noted.
IMPRESSION: Extensive bilateral airspace disease shows worsening on the right.

## 2013-05-01 IMAGING — CR DG CHEST 1V PORT
1 series · 1 of 1 positions shown · non-contrast
Comparison: Portable chest 08/28/2011.

CLINICAL DATA: Check endotracheal tube placement.  Pneumonia.

PORTABLE CHEST - 1 VIEW

[view not recorded]
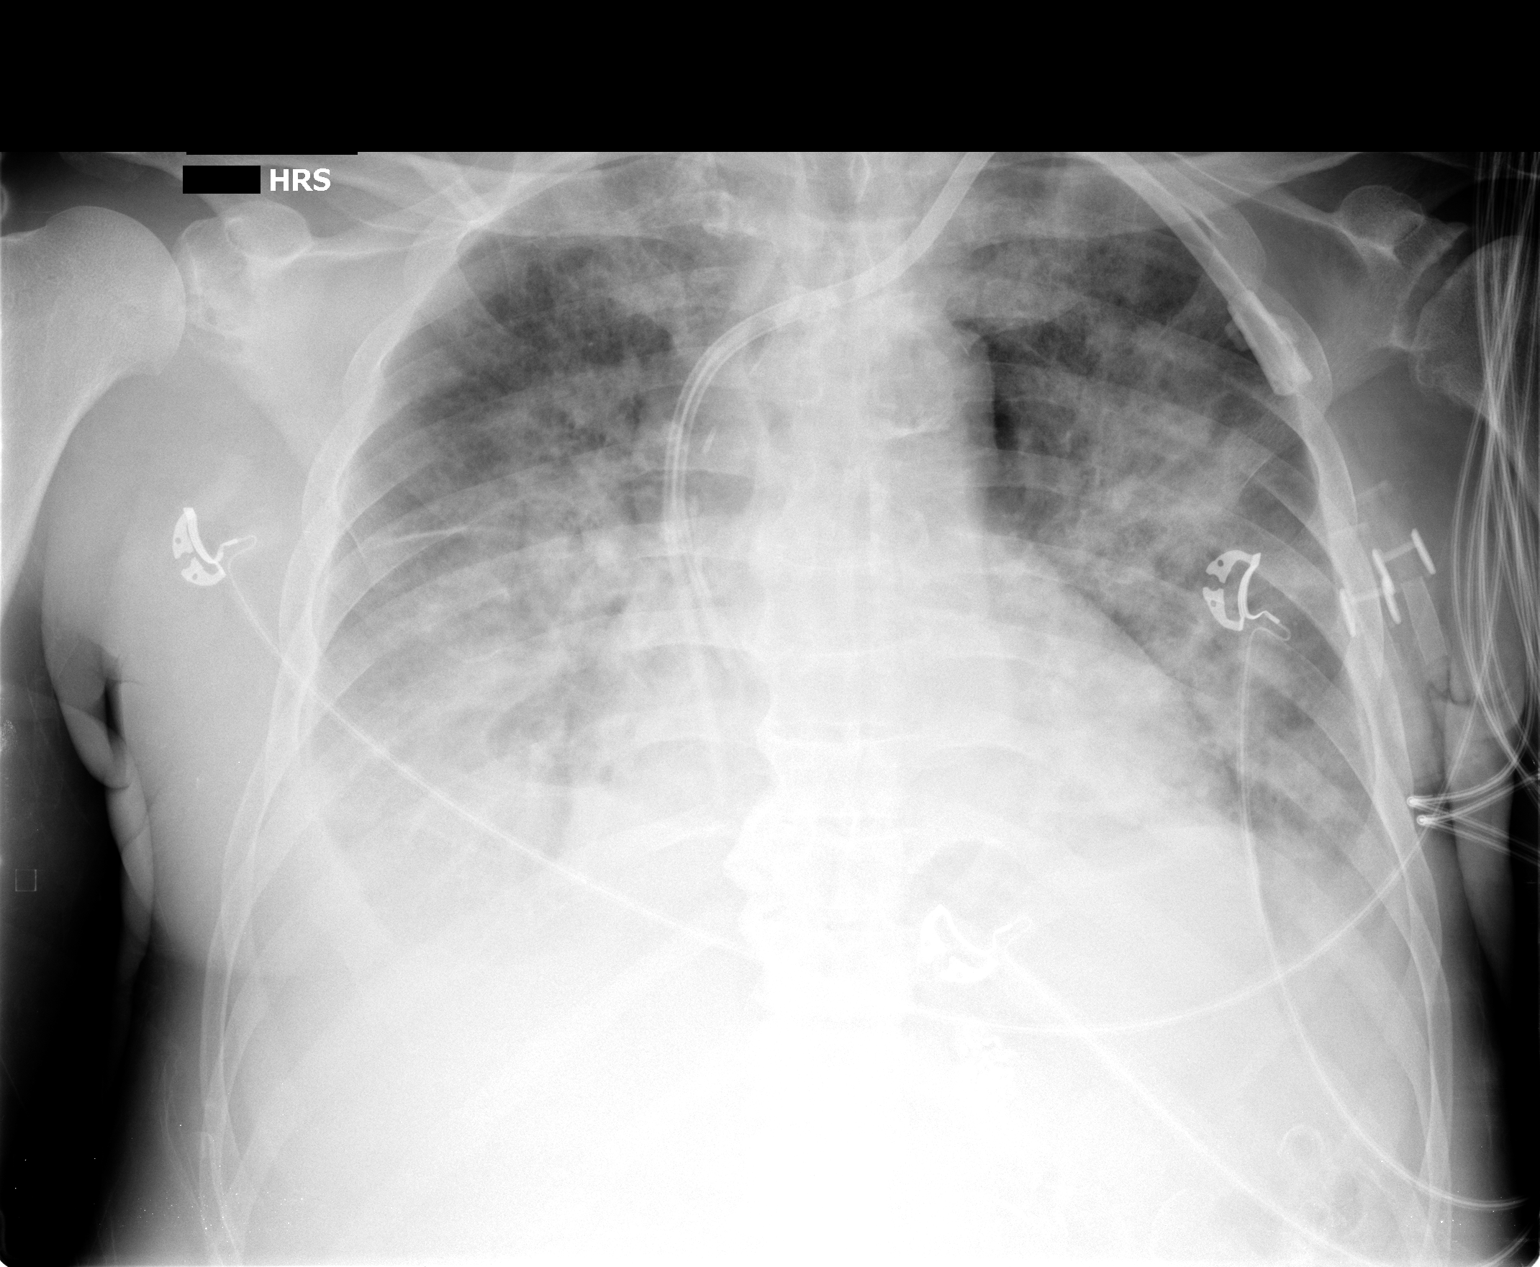

[1 of 1 positions shown; findings below may reference images not displayed]

FINDINGS: The patient has been extubated.  The NG tube was removed
as well.  A left IJ dialysis catheter is stable.  There is slight
improved aeration of the left lung.  Right-sided airspace disease
is not significantly changed.  Bilateral pleural effusions are
evident, right greater than left.  Mild cardiomegaly is stable.
Pancreatic calcifications are again noted.
IMPRESSION: 1.  Slight improved aeration of the left lung.
2.  Right-sided airspace disease is stable.
3.  Slight increase in bilateral pleural effusions, right greater
than left.
4.  The patient has been extubated.  The NG tube was removed as
well.
5.  The appearance remains concerning for bilateral pneumonia.

## 2013-05-06 ENCOUNTER — Encounter: Payer: Self-pay | Admitting: Family

## 2013-05-09 ENCOUNTER — Inpatient Hospital Stay (HOSPITAL_COMMUNITY): Admission: RE | Admit: 2013-05-09 | Payer: Medicare Other | Source: Ambulatory Visit

## 2013-05-09 ENCOUNTER — Ambulatory Visit: Payer: Medicare Other | Admitting: Family

## 2013-05-12 ENCOUNTER — Ambulatory Visit: Payer: Medicare Other | Admitting: Nurse Practitioner

## 2013-05-19 ENCOUNTER — Ambulatory Visit: Payer: Medicare Other | Admitting: Family Medicine

## 2013-05-27 ENCOUNTER — Other Ambulatory Visit: Payer: Self-pay | Admitting: General Practice

## 2013-05-27 DIAGNOSIS — Z89439 Acquired absence of unspecified foot: Secondary | ICD-10-CM

## 2013-05-30 ENCOUNTER — Telehealth: Payer: Self-pay | Admitting: *Deleted

## 2013-05-30 NOTE — Telephone Encounter (Signed)
Script for cane at our office.

## 2013-06-02 ENCOUNTER — Other Ambulatory Visit: Payer: Self-pay | Admitting: *Deleted

## 2013-06-02 ENCOUNTER — Inpatient Hospital Stay (HOSPITAL_COMMUNITY)
Admission: EM | Admit: 2013-06-02 | Discharge: 2013-06-05 | DRG: 291 | Disposition: A | Discharge status: Home / Self Care | Payer: Medicare Other | Attending: Internal Medicine | Admitting: Internal Medicine

## 2013-06-02 ENCOUNTER — Encounter (HOSPITAL_COMMUNITY): Payer: Self-pay | Admitting: Emergency Medicine

## 2013-06-02 ENCOUNTER — Emergency Department (HOSPITAL_COMMUNITY): Payer: Medicare Other

## 2013-06-02 DIAGNOSIS — R0902 Hypoxemia: Secondary | ICD-10-CM

## 2013-06-02 DIAGNOSIS — I4892 Unspecified atrial flutter: Secondary | ICD-10-CM

## 2013-06-02 DIAGNOSIS — N186 End stage renal disease: Secondary | ICD-10-CM

## 2013-06-02 DIAGNOSIS — F17200 Nicotine dependence, unspecified, uncomplicated: Secondary | ICD-10-CM | POA: Diagnosis present

## 2013-06-02 DIAGNOSIS — E8770 Fluid overload, unspecified: Secondary | ICD-10-CM

## 2013-06-02 DIAGNOSIS — R6521 Severe sepsis with septic shock: Secondary | ICD-10-CM

## 2013-06-02 DIAGNOSIS — IMO0002 Reserved for concepts with insufficient information to code with codable children: Secondary | ICD-10-CM

## 2013-06-02 DIAGNOSIS — E41 Nutritional marasmus: Secondary | ICD-10-CM | POA: Diagnosis present

## 2013-06-02 DIAGNOSIS — R590 Localized enlarged lymph nodes: Secondary | ICD-10-CM

## 2013-06-02 DIAGNOSIS — J4489 Other specified chronic obstructive pulmonary disease: Secondary | ICD-10-CM | POA: Diagnosis present

## 2013-06-02 DIAGNOSIS — J9601 Acute respiratory failure with hypoxia: Secondary | ICD-10-CM

## 2013-06-02 DIAGNOSIS — L89159 Pressure ulcer of sacral region, unspecified stage: Secondary | ICD-10-CM

## 2013-06-02 DIAGNOSIS — Z803 Family history of malignant neoplasm of breast: Secondary | ICD-10-CM

## 2013-06-02 DIAGNOSIS — K5520 Angiodysplasia of colon without hemorrhage: Secondary | ICD-10-CM

## 2013-06-02 DIAGNOSIS — G9340 Encephalopathy, unspecified: Secondary | ICD-10-CM

## 2013-06-02 DIAGNOSIS — I5042 Chronic combined systolic (congestive) and diastolic (congestive) heart failure: Secondary | ICD-10-CM

## 2013-06-02 DIAGNOSIS — M899 Disorder of bone, unspecified: Secondary | ICD-10-CM | POA: Diagnosis present

## 2013-06-02 DIAGNOSIS — R4182 Altered mental status, unspecified: Secondary | ICD-10-CM

## 2013-06-02 DIAGNOSIS — I509 Heart failure, unspecified: Principal | ICD-10-CM

## 2013-06-02 DIAGNOSIS — K219 Gastro-esophageal reflux disease without esophagitis: Secondary | ICD-10-CM | POA: Diagnosis present

## 2013-06-02 DIAGNOSIS — Z87891 Personal history of nicotine dependence: Secondary | ICD-10-CM

## 2013-06-02 DIAGNOSIS — J189 Pneumonia, unspecified organism: Secondary | ICD-10-CM

## 2013-06-02 DIAGNOSIS — M949 Disorder of cartilage, unspecified: Secondary | ICD-10-CM

## 2013-06-02 DIAGNOSIS — D631 Anemia in chronic kidney disease: Secondary | ICD-10-CM

## 2013-06-02 DIAGNOSIS — I6350 Cerebral infarction due to unspecified occlusion or stenosis of unspecified cerebral artery: Secondary | ICD-10-CM

## 2013-06-02 DIAGNOSIS — I739 Peripheral vascular disease, unspecified: Secondary | ICD-10-CM

## 2013-06-02 DIAGNOSIS — N189 Chronic kidney disease, unspecified: Secondary | ICD-10-CM

## 2013-06-02 DIAGNOSIS — A419 Sepsis, unspecified organism: Secondary | ICD-10-CM

## 2013-06-02 DIAGNOSIS — Z8673 Personal history of transient ischemic attack (TIA), and cerebral infarction without residual deficits: Secondary | ICD-10-CM

## 2013-06-02 DIAGNOSIS — J449 Chronic obstructive pulmonary disease, unspecified: Secondary | ICD-10-CM

## 2013-06-02 DIAGNOSIS — D638 Anemia in other chronic diseases classified elsewhere: Secondary | ICD-10-CM | POA: Diagnosis present

## 2013-06-02 DIAGNOSIS — E785 Hyperlipidemia, unspecified: Secondary | ICD-10-CM

## 2013-06-02 DIAGNOSIS — G609 Hereditary and idiopathic neuropathy, unspecified: Secondary | ICD-10-CM

## 2013-06-02 DIAGNOSIS — N2581 Secondary hyperparathyroidism of renal origin: Secondary | ICD-10-CM | POA: Diagnosis present

## 2013-06-02 DIAGNOSIS — S88919A Complete traumatic amputation of unspecified lower leg, level unspecified, initial encounter: Secondary | ICD-10-CM

## 2013-06-02 DIAGNOSIS — I959 Hypotension, unspecified: Secondary | ICD-10-CM

## 2013-06-02 DIAGNOSIS — R599 Enlarged lymph nodes, unspecified: Secondary | ICD-10-CM | POA: Diagnosis present

## 2013-06-02 DIAGNOSIS — S98119A Complete traumatic amputation of unspecified great toe, initial encounter: Secondary | ICD-10-CM

## 2013-06-02 DIAGNOSIS — E8779 Other fluid overload: Secondary | ICD-10-CM

## 2013-06-02 DIAGNOSIS — J9610 Chronic respiratory failure, unspecified whether with hypoxia or hypercapnia: Secondary | ICD-10-CM | POA: Diagnosis present

## 2013-06-02 DIAGNOSIS — I120 Hypertensive chronic kidney disease with stage 5 chronic kidney disease or end stage renal disease: Secondary | ICD-10-CM | POA: Diagnosis present

## 2013-06-02 DIAGNOSIS — J90 Pleural effusion, not elsewhere classified: Secondary | ICD-10-CM

## 2013-06-02 DIAGNOSIS — Z992 Dependence on renal dialysis: Secondary | ICD-10-CM

## 2013-06-02 DIAGNOSIS — G4700 Insomnia, unspecified: Secondary | ICD-10-CM

## 2013-06-02 DIAGNOSIS — I4891 Unspecified atrial fibrillation: Secondary | ICD-10-CM

## 2013-06-02 DIAGNOSIS — J690 Pneumonitis due to inhalation of food and vomit: Secondary | ICD-10-CM

## 2013-06-02 DIAGNOSIS — T8189XA Other complications of procedures, not elsewhere classified, initial encounter: Secondary | ICD-10-CM

## 2013-06-02 DIAGNOSIS — I2510 Atherosclerotic heart disease of native coronary artery without angina pectoris: Secondary | ICD-10-CM

## 2013-06-02 DIAGNOSIS — S88119A Complete traumatic amputation at level between knee and ankle, unspecified lower leg, initial encounter: Secondary | ICD-10-CM

## 2013-06-02 DIAGNOSIS — M79609 Pain in unspecified limb: Secondary | ICD-10-CM

## 2013-06-02 DIAGNOSIS — Z9981 Dependence on supplemental oxygen: Secondary | ICD-10-CM

## 2013-06-02 LAB — CBC WITH DIFFERENTIAL/PLATELET
BASOS ABS: 0 10*3/uL (ref 0.0–0.1)
Basophils Relative: 0 % (ref 0–1)
EOS ABS: 0 10*3/uL (ref 0.0–0.7)
Eosinophils Relative: 0 % (ref 0–5)
HCT: 37.7 % — ABNORMAL LOW (ref 39.0–52.0)
Hemoglobin: 11.5 g/dL — ABNORMAL LOW (ref 13.0–17.0)
LYMPHS PCT: 7 % — AB (ref 12–46)
Lymphs Abs: 0.9 10*3/uL (ref 0.7–4.0)
MCH: 27.1 pg (ref 26.0–34.0)
MCHC: 30.5 g/dL (ref 30.0–36.0)
MCV: 88.9 fL (ref 78.0–100.0)
Monocytes Absolute: 0.6 10*3/uL (ref 0.1–1.0)
Monocytes Relative: 5 % (ref 3–12)
NEUTROS PCT: 88 % — AB (ref 43–77)
Neutro Abs: 11.3 10*3/uL — ABNORMAL HIGH (ref 1.7–7.7)
Platelets: 165 10*3/uL (ref 150–400)
RBC: 4.24 MIL/uL (ref 4.22–5.81)
RDW: 20.9 % — ABNORMAL HIGH (ref 11.5–15.5)
WBC: 12.8 10*3/uL — AB (ref 4.0–10.5)

## 2013-06-02 LAB — COMPREHENSIVE METABOLIC PANEL
ALBUMIN: 2.9 g/dL — AB (ref 3.5–5.2)
ALK PHOS: 89 U/L (ref 39–117)
ALT: 7 U/L (ref 0–53)
AST: 15 U/L (ref 0–37)
BUN: 19 mg/dL (ref 6–23)
CHLORIDE: 96 meq/L (ref 96–112)
CO2: 27 mEq/L (ref 19–32)
Calcium: 9.9 mg/dL (ref 8.4–10.5)
Creatinine, Ser: 5.16 mg/dL — ABNORMAL HIGH (ref 0.50–1.35)
GFR calc Af Amer: 12 mL/min — ABNORMAL LOW (ref >90)
GFR calc non Af Amer: 11 mL/min — ABNORMAL LOW (ref >90)
Glucose, Bld: 106 mg/dL — ABNORMAL HIGH (ref 70–99)
Potassium: 3.4 mEq/L — ABNORMAL LOW (ref 3.7–5.3)
SODIUM: 140 meq/L (ref 137–147)
TOTAL PROTEIN: 7.7 g/dL (ref 6.0–8.3)
Total Bilirubin: 0.8 mg/dL (ref 0.3–1.2)

## 2013-06-02 LAB — PRO B NATRIURETIC PEPTIDE: PRO B NATRI PEPTIDE: 45128 pg/mL — AB (ref 0–125)

## 2013-06-02 MED ORDER — ENOXAPARIN SODIUM 40 MG/0.4ML ~~LOC~~ SOLN
40.0000 mg | SUBCUTANEOUS | Status: DC
  Administered 2013-06-02: 40 mg via SUBCUTANEOUS
  Filled 2013-06-02 (×2): qty 0.4

## 2013-06-02 MED ORDER — RENA-VITE PO TABS
1.0000 | ORAL_TABLET | Freq: Every day | ORAL | Status: DC
  Administered 2013-06-02 – 2013-06-03 (×2): 1 via ORAL
  Administered 2013-06-04: 19:00:00 via ORAL
  Administered 2013-06-05: 1 via ORAL
  Filled 2013-06-02 (×4): qty 1

## 2013-06-02 MED ORDER — ISOSORBIDE MONONITRATE ER 30 MG PO TB24
30.0000 mg | ORAL_TABLET | Freq: Every day | ORAL | Status: DC
  Administered 2013-06-03 – 2013-06-05 (×3): 30 mg via ORAL
  Filled 2013-06-02 (×4): qty 1

## 2013-06-02 MED ORDER — LIDOCAINE HCL (PF) 1 % IJ SOLN: 5.0000 mL | INTRAMUSCULAR | Status: DC | PRN

## 2013-06-02 MED ORDER — VANCOMYCIN HCL 10 G IV SOLR
1500.0000 mg | Freq: Once | INTRAVENOUS | Status: AC
  Administered 2013-06-03: 1500 mg via INTRAVENOUS
  Filled 2013-06-02 (×2): qty 1500

## 2013-06-02 MED ORDER — NEPRO/CARBSTEADY PO LIQD
237.0000 mL | ORAL | Status: DC | PRN
  Filled 2013-06-02: qty 237

## 2013-06-02 MED ORDER — CINACALCET HCL 30 MG PO TABS
30.0000 mg | ORAL_TABLET | Freq: Every day | ORAL | Status: DC
  Administered 2013-06-03 – 2013-06-05 (×3): 30 mg via ORAL
  Filled 2013-06-02 (×5): qty 1

## 2013-06-02 MED ORDER — SODIUM CHLORIDE 0.9 % IV SOLN: 100.0000 mL | INTRAVENOUS | Status: DC | PRN

## 2013-06-02 MED ORDER — DM-GUAIFENESIN ER 30-600 MG PO TB12
1.0000 | ORAL_TABLET | Freq: Two times a day (BID) | ORAL | Status: DC
  Administered 2013-06-03 – 2013-06-05 (×5): 1 via ORAL
  Filled 2013-06-02 (×7): qty 1

## 2013-06-02 MED ORDER — HEPARIN SODIUM (PORCINE) 1000 UNIT/ML DIALYSIS
1000.0000 [IU] | INTRAMUSCULAR | Status: DC | PRN
  Filled 2013-06-02: qty 1

## 2013-06-02 MED ORDER — DEXTROSE 5 % IV SOLN: 2.0000 g | Freq: Once | INTRAVENOUS | Status: AC

## 2013-06-02 MED ORDER — METHYLPREDNISOLONE SODIUM SUCC 125 MG IJ SOLR
60.0000 mg | INTRAMUSCULAR | Status: DC
  Administered 2013-06-03 (×2): 60 mg via INTRAVENOUS
  Filled 2013-06-02 (×2): qty 0.96
  Filled 2013-06-02: qty 2

## 2013-06-02 MED ORDER — PENTAFLUOROPROP-TETRAFLUOROETH EX AERO
1.0000 "application " | INHALATION_SPRAY | CUTANEOUS | Status: DC | PRN
  Filled 2013-06-02: qty 103.5

## 2013-06-02 MED ORDER — CEFEPIME HCL 2 G IJ SOLR
2.0000 g | INTRAMUSCULAR | Status: DC | PRN
  Administered 2013-06-03: 2 g via INTRAVENOUS
  Filled 2013-06-02: qty 2

## 2013-06-02 MED ORDER — DULOXETINE HCL 60 MG PO CPEP
60.0000 mg | ORAL_CAPSULE | Freq: Every day | ORAL | Status: DC
  Administered 2013-06-02 – 2013-06-05 (×4): 60 mg via ORAL
  Filled 2013-06-02 (×4): qty 1

## 2013-06-02 MED ORDER — LIDOCAINE-PRILOCAINE 2.5-2.5 % EX CREA
1.0000 "application " | TOPICAL_CREAM | CUTANEOUS | Status: DC | PRN
  Filled 2013-06-02: qty 5

## 2013-06-02 MED ORDER — ALTEPLASE 2 MG IJ SOLR
2.0000 mg | Freq: Once | INTRAMUSCULAR | Status: AC | PRN
  Filled 2013-06-02: qty 2

## 2013-06-02 MED ORDER — CALCIUM ACETATE 667 MG PO CAPS
1334.0000 mg | ORAL_CAPSULE | Freq: Three times a day (TID) | ORAL | Status: DC
  Administered 2013-06-02 – 2013-06-03 (×3): 1334 mg via ORAL
  Filled 2013-06-02 (×8): qty 2

## 2013-06-02 MED ORDER — ALBUTEROL SULFATE (2.5 MG/3ML) 0.083% IN NEBU
2.5000 mg | INHALATION_SOLUTION | Freq: Four times a day (QID) | RESPIRATORY_TRACT | Status: DC | PRN
  Administered 2013-06-03: 2.5 mg via RESPIRATORY_TRACT
  Filled 2013-06-02: qty 3

## 2013-06-02 MED ORDER — ASPIRIN EC 81 MG PO TBEC
81.0000 mg | DELAYED_RELEASE_TABLET | Freq: Every day | ORAL | Status: DC
  Administered 2013-06-02 – 2013-06-05 (×4): 81 mg via ORAL
  Filled 2013-06-02 (×4): qty 1

## 2013-06-02 MED ORDER — OXYCODONE HCL 5 MG PO TABS: 5.0000 mg | ORAL_TABLET | ORAL | Status: DC | PRN

## 2013-06-02 MED ORDER — ALBUTEROL SULFATE (2.5 MG/3ML) 0.083% IN NEBU
5.0000 mg | INHALATION_SOLUTION | Freq: Once | RESPIRATORY_TRACT | Status: AC
  Administered 2013-06-02: 5 mg via RESPIRATORY_TRACT
  Filled 2013-06-02: qty 6

## 2013-06-02 MED ORDER — HEPARIN SODIUM (PORCINE) 1000 UNIT/ML DIALYSIS
20.0000 [IU]/kg | INTRAMUSCULAR | Status: DC | PRN
  Filled 2013-06-02: qty 2

## 2013-06-02 MED ORDER — FOLIC ACID 1 MG PO TABS
1.0000 mg | ORAL_TABLET | Freq: Every day | ORAL | Status: DC
  Administered 2013-06-03 – 2013-06-05 (×3): 1 mg via ORAL
  Filled 2013-06-02 (×3): qty 1

## 2013-06-02 MED ORDER — SODIUM CHLORIDE 0.9 % IV SOLN
INTRAVENOUS | Status: DC
  Administered 2013-06-02: 18:00:00 via INTRAVENOUS

## 2013-06-02 MED ORDER — IPRATROPIUM-ALBUTEROL 0.5-2.5 (3) MG/3ML IN SOLN
3.0000 mL | Freq: Four times a day (QID) | RESPIRATORY_TRACT | Status: DC
  Administered 2013-06-02 – 2013-06-04 (×7): 3 mL via RESPIRATORY_TRACT
  Filled 2013-06-02 (×7): qty 3

## 2013-06-02 MED ORDER — ACETAMINOPHEN 650 MG RE SUPP: 650.0000 mg | Freq: Four times a day (QID) | RECTAL | Status: DC | PRN

## 2013-06-02 MED ORDER — VANCOMYCIN HCL IN DEXTROSE 750-5 MG/150ML-% IV SOLN
750.0000 mg | INTRAVENOUS | Status: DC
  Filled 2013-06-02 (×2): qty 150

## 2013-06-02 MED ORDER — IPRATROPIUM BROMIDE 0.02 % IN SOLN
0.5000 mg | Freq: Once | RESPIRATORY_TRACT | Status: AC
  Administered 2013-06-02: 0.5 mg via RESPIRATORY_TRACT
  Filled 2013-06-02: qty 2.5

## 2013-06-02 MED ORDER — BUDESONIDE-FORMOTEROL FUMARATE 80-4.5 MCG/ACT IN AERO
2.0000 | INHALATION_SPRAY | Freq: Two times a day (BID) | RESPIRATORY_TRACT | Status: DC
  Administered 2013-06-03 – 2013-06-05 (×5): 2 via RESPIRATORY_TRACT
  Filled 2013-06-02 (×2): qty 6.9

## 2013-06-02 MED ORDER — PANTOPRAZOLE SODIUM 40 MG PO TBEC
40.0000 mg | DELAYED_RELEASE_TABLET | Freq: Every day | ORAL | Status: DC
  Administered 2013-06-03 – 2013-06-05 (×3): 40 mg via ORAL
  Filled 2013-06-02 (×3): qty 1

## 2013-06-02 MED ORDER — DOCUSATE SODIUM 100 MG PO CAPS
100.0000 mg | ORAL_CAPSULE | Freq: Two times a day (BID) | ORAL | Status: DC
  Administered 2013-06-03 – 2013-06-05 (×5): 100 mg via ORAL
  Filled 2013-06-02 (×7): qty 1

## 2013-06-02 MED ORDER — ACETAMINOPHEN 325 MG PO TABS: 650.0000 mg | ORAL_TABLET | Freq: Four times a day (QID) | ORAL | Status: DC | PRN

## 2013-06-02 MED ORDER — HYDRALAZINE HCL 25 MG PO TABS
25.0000 mg | ORAL_TABLET | Freq: Every day | ORAL | Status: DC
  Administered 2013-06-04: 25 mg via ORAL
  Filled 2013-06-02 (×4): qty 1

## 2013-06-02 MED ORDER — GABAPENTIN 300 MG PO CAPS
300.0000 mg | ORAL_CAPSULE | Freq: Three times a day (TID) | ORAL | Status: DC
Start: 2013-06-02 — End: 2013-06-02

## 2013-06-02 NOTE — ED Provider Notes (Addendum)
CSN: [id number]     Arrival date & time 06/02/13  1314 History   First MD Initiated Contact with Patient 06/02/13 1346     Chief Complaint  Patient presents with  . Shortness of Breath     (Consider location/radiation/quality/duration/timing/severity/associated sxs/prior Treatment) HPI Comments: Patient is a 63 year old male with history of end-stage renal disease on hemodialysis, peripheral vascular disease, hypertension. He is brought here for evaluation of shortness of breath, chest congestion and increased confusion since last night. He denies fevers or chills. He denies productive cough. His last dialysis session was yesterday. His nephrologist is Dr. Lowanda Foster in [place].  Patient is a 63 y.o. male presenting with shortness of breath. The history is provided by the patient.  Shortness of Breath Severity:  Moderate Onset quality:  Gradual Duration:  12 hours Timing:  Constant Progression:  Worsening Chronicity:  New Context: activity   Relieved by:  Nothing Worsened by:  Nothing tried   Past Medical History  Diagnosis Date  . Hyperlipidemia   . Arthritis   . Claudication   . Anemia   . Chronic diastolic CHF (congestive heart failure) 07/2010    a. 07/2008 Echo EF 50-55%, mild-mod LVH, Gr 1 DD.  Marland Kitchen Peripheral vascular occlusive disease     a. 07/2011 Periph Angio: No signif Ao-illiac dzs, LCF stenosis, 100% LSFA w recon above knee pop and 3 vessel runoff, 100% RSFA w/ recon above knee --> pending L Fem to below Knee pop bypass.  Marland Kitchen History of tobacco abuse   . Hypertension     takes Amlodipine,Metoprolol,and Prinivil daily  . Dyspnea on exertion     with exertion  . Cataract     bilateral  . Dialysis patient   . PUD (peptic ulcer disease) mid 1990's    with GI bleed.  endoscoped in mid 1990's at [facility]  . Stroke 2005    no residual  . Asthma     08/2012  . GERD (gastroesophageal reflux disease)   . History of blood transfusion     peptic ulcer  . ESRD  (end stage renal disease)     a. MWF dialysis in [place] (followed by Dr. Lowanda Foster)  . COPD (chronic obstructive pulmonary disease) 04/01/2013   Past Surgical History  Procedure Laterality Date  . Arteriovenous graft placement    . Av fistula placement  12/10/2000    Right brachiocephalic arteriovenous   . Aortagram  07/29/2011    Abdominal Aortagram  . Cardiac catheterization  08/01/11    Left heart catheterization  . Femoral-popliteal bypass graft  09/09/2011    Procedure: BYPASS GRAFT FEMORAL-POPLITEAL ARTERY;  Surgeon: Serafina Mitchell, MD;  Location: [place];  Service: Vascular;  Laterality: Left;  . Amputation  09/12/2011    Procedure: AMPUTATION BELOW KNEE;  Surgeon: Serafina Mitchell, MD;  Location: [facility] OR;  Service: Vascular;  Laterality: Left;  TRANSMETATARSAL  . Application of wound vac  10/30/2011    Procedure: APPLICATION OF WOUND VAC;  Surgeon: Serafina Mitchell, MD;  Location: [place];  Service: Vascular;  Laterality: Left;  . Groin debridement  11/01/2011    Procedure: Virl Son DEBRIDEMENT;  Surgeon: Angelia Mould, MD;  Location: [facility] OR;  Service: Vascular;  Laterality: Left;  . Esophagogastroduodenoscopy  11/06/2011    Procedure: ESOPHAGOGASTRODUODENOSCOPY (EGD);  Surgeon: Jerene Bears, MD;  Location: [place];  Service: Gastroenterology;  Laterality: N/A;  . Femoral-popliteal bypass graft  01/02/2012    Procedure: BYPASS GRAFT FEMORAL-POPLITEAL ARTERY;  Surgeon:  Serafina Mitchell, MD;  Location: [facility] OR;  Service: Vascular;  Laterality: Right;  . Amputation  01/02/2012    Procedure: AMPUTATION RAY;  Surgeon: Serafina Mitchell, MD;  Location: [facility] OR;  Service: Vascular;  Laterality: Right;  . Toe amputation Right     5/13- great toe  . Exchange of a dialysis catheter N/A 07/15/2012    Procedure: EXCHANGE OF A DIALYSIS CATHETER;  Surgeon: Conrad Orin, MD;  Location: [place];  Service: Vascular;  Laterality: N/A;  . Av fistula placement Left 07/15/2012    Procedure: ARTERIOVENOUS (AV) FISTULA  CREATION;  Surgeon: Conrad LaCrosse, MD;  Location: [place];  Service: Vascular;  Laterality: Left;  . Av fistula placement Left 09/07/2012    Procedure: INSERTION OF ARTERIOVENOUS (AV) GORE-TEX GRAFT ARM;  Surgeon: Conrad Balta, MD;  Location: [place];  Service: Vascular;  Laterality: Left;  . Insertion of dialysis catheter Right 09/07/2012    Procedure: INSERTION OF a Femoral DIALYSIS CATHETER;  Surgeon: Conrad Big Sky, MD;  Location: [place];  Service: Vascular;  Laterality: Right;  . Lipoma excision Left 09/07/2012    Procedure: EXCISION Seroma left arm;  Surgeon: Conrad Blossom, MD;  Location: [place];  Service: Vascular;  Laterality: Left;  . Hernia repair      umbilical  . Thrombectomy and revision of arterioventous (av) goretex  graft Left 11/30/2012    Procedure: THROMBECTOMY AND REVISION OF ARTERIOVENTOUS (AV) GORETEX  GRAFT;  Surgeon: Angelia Mould, MD;  Location: [facility] OR;  Service: Vascular;  Laterality: Left;   Family History  Problem Relation Age of Onset  . Breast cancer Mother   . Cancer Mother   . Cancer Father   . Anesthesia problems Neg Hx   . Hypotension Neg Hx   . Malignant hyperthermia Neg Hx   . Pseudochol deficiency Neg Hx    History  Substance Use Topics  . Smoking status: Current Every Day Smoker -- 0.50 packs/day for 40 years    Types: Cigarettes  . Smokeless tobacco: Never Used     Comment: quit, but still "sneaks a smoke" occasionally  . Alcohol Use: No    Review of Systems  Respiratory: Positive for shortness of breath.   All other systems reviewed and are negative.      Allergies  Ambien  Home Medications   Current Outpatient Rx  Name  Route  Sig  Dispense  Refill  . albuterol (PROVENTIL) (2.5 MG/3ML) 0.083% nebulizer solution   Nebulization   Take 3 mLs (2.5 mg total) by nebulization every 6 (six) hours as needed for wheezing or shortness of breath.   75 mL   3   . ALPRAZolam (XANAX) 1 MG tablet   Oral   Take 1 tablet (1 mg total) by mouth 2  (two) times daily as needed for anxiety or sleep.   60 tablet   2   . benzonatate (TESSALON) 100 MG capsule   Oral   Take 1 capsule (100 mg total) by mouth 3 (three) times daily as needed.   30 capsule   0   . budesonide-formoterol (SYMBICORT) 80-4.5 MCG/ACT inhaler   Inhalation   Inhale 2 puffs into the lungs 2 (two) times daily.   1 Inhaler   12   . calcium acetate (PHOSLO) 667 MG capsule   Oral   Take 1,334 mg by mouth 3 (three) times daily with meals.          . cinacalcet (SENSIPAR)  30 MG tablet   Oral   Take 1 tablet (30 mg total) by mouth daily with breakfast.   30 tablet   1   . DULoxetine (CYMBALTA) 60 MG capsule   Oral   Take 60 mg by mouth daily.         . folic acid (FOLVITE) 1 MG tablet   Oral   Take 1 mg by mouth daily.         Marland Kitchen gabapentin (NEURONTIN) 300 MG capsule   Oral   Take 1 capsule (300 mg total) by mouth 3 (three) times daily.   270 capsule   0   . hydrALAZINE (APRESOLINE) 25 MG tablet   Oral   Take 25 mg by mouth daily.         . isosorbide mononitrate (IMDUR) 30 MG 24 hr tablet   Oral   Take 30 mg by mouth daily.         Marland Kitchen levofloxacin (LEVAQUIN) 500 MG tablet   Oral   Take 1 tablet (500 mg total) by mouth every other day. For 3 more doses   3 tablet   0   . multivitamin (RENA-VIT) TABS tablet   Oral   Take 1 tablet by mouth daily.         Marland Kitchen omeprazole (PRILOSEC) 20 MG capsule   Oral   Take 20 mg by mouth daily.         Marland Kitchen oxyCODONE-acetaminophen (PERCOCET) 7.5-325 MG per tablet   Oral   Take 1 tablet by mouth every 8 (eight) hours as needed for pain.   20 tablet   0   . predniSONE (DELTASONE) 20 MG tablet      Take 3 tabs once daily for 3 days, then take 2 tablets once daily for 3 days, then take 1 tablet once daily for 3 days and then STOP.   18 tablet   0   . PROAIR HFA 108 (90 BASE) MCG/ACT inhaler   Inhalation   Inhale 1 puff into the lungs every 4 (four) hours as needed for wheezing.           Marland Kitchen tiZANidine (ZANAFLEX) 4 MG tablet   Oral   Take 4 mg by mouth every 6 (six) hours as needed for muscle spasms.           There were no vitals taken for this visit. Physical Exam  Nursing note and vitals reviewed. Constitutional: He is oriented to person, place, and time.  Patient is an elderly male who appears thin and somewhat confused. He is in no acute distress.  HENT:  Head: Normocephalic and atraumatic.  Mouth/Throat: Oropharynx is clear and moist.  Neck: Normal range of motion. Neck supple.  Cardiovascular: Normal rate, regular rhythm and normal heart sounds.   Pulmonary/Chest: Effort normal.  There are bilateral rales present.  Abdominal: Soft. Bowel sounds are normal. He exhibits no distension. There is no tenderness.  Musculoskeletal: Normal range of motion. He exhibits no edema.  Neurological: He is alert and oriented to person, place, and time.  He is alert and oriented however appears somewhat confused when asked about his dialysis schedule.  Skin: Skin is warm and dry.    ED Course  Procedures (including critical care time) Labs Review Labs Reviewed - No data to display Imaging Review No results found.   Date: 06/02/2013  Rate: 90  Rhythm: normal sinus rhythm  QRS Axis: normal  Intervals: normal  ST/T Wave abnormalities: nonspecific T wave  changes  Conduction Disutrbances:none  Narrative Interpretation:   Old EKG Reviewed: unchanged    MDM   Final diagnoses:  None    A patient is a 63 year old male with history of end-stage renal disease. He is on hemodialysis. He presents today with shortness of breath and confusion since last night. He has bilateral rales on exam and his BNP is markedly elevated. Chest x-ray reveals bilateral pleural effusions and evidence for pulmonary edema. I suspect he will require dialysis on urgent basis and will consult internal medicine for admission. He has been somewhat hypotensive while in the emergency department. He  tells me his blood pressure is normally low however I feel as though admission is in the patient's best interest. I discussed with Dr. Clydene Laming from [facility] who agrees to admit.    Veryl Speak, MD 06/02/13 [place], MD 06/02/13 [id number]

## 2013-06-02 NOTE — Consult Note (Signed)
[facility] Renal Consultation Note    Indication for Consultation:  Management of ESRD/hemodialysis; anemia, hypertension/volume and secondary hyperparathyroidism  HPI: [name] is a 63 y.o. male.  Pt is a 63yo male with ESRD on HD MWF in [place] (primary nephrologist Dr. Lowanda Foster) presenting to the ED for SOB and AMS that started yesterday.  PMH significant for CAD / PVD, HTN, CHF, CVA, Afib/flutter, nonhealing sacral decubitus, and is s/p left transmetatarsal and right great toe amputations. Nephrology consulted for HD and fluid / volume management.  Pt reports that he went to HD yesterday but "didn't have any fluid on".  It is unclear if his home unit challenged his EDW as the dialysis unit was closed.  He also reports cough prod of clear sputum as well as fevers/chills.   Denies any CP, hemoptysis, orthopnea.   Past Medical History  Diagnosis Date  . Hyperlipidemia   . Arthritis   . Claudication   . Anemia   . Chronic diastolic CHF (congestive heart failure) 07/2010    a. 07/2008 Echo EF 50-55%, mild-mod LVH, Gr 1 DD.  Marland Kitchen Peripheral vascular occlusive disease     a. 07/2011 Periph Angio: No signif Ao-illiac dzs, LCF stenosis, 100% LSFA w recon above knee pop and 3 vessel runoff, 100% RSFA w/ recon above knee --> pending L Fem to below Knee pop bypass.  Marland Kitchen History of tobacco abuse   . Hypertension     takes Amlodipine,Metoprolol,and Prinivil daily  . Dyspnea on exertion     with exertion  . Cataract     bilateral  . Dialysis patient   . PUD (peptic ulcer disease) mid 1990's    with GI bleed.  endoscoped in mid 1990's at [facility]  . Stroke 2005    no residual  . Asthma     08/2012  . GERD (gastroesophageal reflux disease)   . History of blood transfusion     peptic ulcer  . ESRD (end stage renal disease)     a. MWF dialysis in [place] (followed by Dr. Lowanda Foster)  . COPD (chronic obstructive pulmonary disease) 04/01/2013   Past Surgical History  Procedure  Laterality Date  . Arteriovenous graft placement    . Av fistula placement  12/10/2000    Right brachiocephalic arteriovenous   . Aortagram  07/29/2011    Abdominal Aortagram  . Cardiac catheterization  08/01/11    Left heart catheterization  . Femoral-popliteal bypass graft  09/09/2011    Procedure: BYPASS GRAFT FEMORAL-POPLITEAL ARTERY;  Surgeon: Serafina Mitchell, MD;  Location: [place];  Service: Vascular;  Laterality: Left;  . Amputation  09/12/2011    Procedure: AMPUTATION BELOW KNEE;  Surgeon: Serafina Mitchell, MD;  Location: [facility] OR;  Service: Vascular;  Laterality: Left;  TRANSMETATARSAL  . Application of wound vac  10/30/2011    Procedure: APPLICATION OF WOUND VAC;  Surgeon: Serafina Mitchell, MD;  Location: [place];  Service: Vascular;  Laterality: Left;  . Groin debridement  11/01/2011    Procedure: Virl Son DEBRIDEMENT;  Surgeon: Angelia Mould, MD;  Location: [facility] OR;  Service: Vascular;  Laterality: Left;  . Esophagogastroduodenoscopy  11/06/2011    Procedure: ESOPHAGOGASTRODUODENOSCOPY (EGD);  Surgeon: Jerene Bears, MD;  Location: [place];  Service: Gastroenterology;  Laterality: N/A;  . Femoral-popliteal bypass graft  01/02/2012    Procedure: BYPASS GRAFT FEMORAL-POPLITEAL ARTERY;  Surgeon: Serafina Mitchell, MD;  Location: [place];  Service: Vascular;  Laterality: Right;  . Amputation  01/02/2012    Procedure: AMPUTATION RAY;  Surgeon: Serafina Mitchell, MD;  Location: [place];  Service: Vascular;  Laterality: Right;  . Toe amputation Right     5/13- great toe  . Exchange of a dialysis catheter N/A 07/15/2012    Procedure: EXCHANGE OF A DIALYSIS CATHETER;  Surgeon: Conrad [place], MD;  Location: [place];  Service: Vascular;  Laterality: N/A;  . Av fistula placement Left 07/15/2012    Procedure: ARTERIOVENOUS (AV) FISTULA CREATION;  Surgeon: Conrad [place]chi, MD;  Location: [place];  Service: Vascular;  Laterality: Left;  . Av fistula placement Left 09/07/2012    Procedure: INSERTION OF ARTERIOVENOUS (AV)  GORE-TEX GRAFT ARM;  Surgeon: Conrad [place], MD;  Location: [place];  Service: Vascular;  Laterality: Left;  . Insertion of dialysis catheter Right 09/07/2012    Procedure: INSERTION OF a Femoral DIALYSIS CATHETER;  Surgeon: Conrad [place], MD;  Location: [place];  Service: Vascular;  Laterality: Right;  . Lipoma excision Left 09/07/2012    Procedure: EXCISION Seroma left arm;  Surgeon: Conrad , MD;  Location: [place];  Service: Vascular;  Laterality: Left;  . Hernia repair      umbilical  . Thrombectomy and revision of arterioventous (av) goretex  graft Left 11/30/2012    Procedure: THROMBECTOMY AND REVISION OF ARTERIOVENTOUS (AV) GORETEX  GRAFT;  Surgeon: Angelia Mould, MD;  Location: [facility] OR;  Service: Vascular;  Laterality: Left;   Family History:   Family History  Problem Relation Age of Onset  . Breast cancer Mother   . Cancer Mother   . Cancer Father   . Anesthesia problems Neg Hx   . Hypotension Neg Hx   . Malignant hyperthermia Neg Hx   . Pseudochol deficiency Neg Hx    Social History:  reports that he has been smoking Cigarettes.  He has a 20 pack-year smoking history. He has never used smokeless tobacco. He reports that he does not drink alcohol or use illicit drugs. Allergies  Allergen Reactions  . Ambien [Zolpidem Tartrate] Other (See Comments)    Hallucinations   Prior to Admission medications   Medication Sig Start Date End Date Taking? Authorizing Provider  albuterol (PROVENTIL HFA;VENTOLIN HFA) 108 (90 BASE) MCG/ACT inhaler Inhale 1-2 puffs into the lungs every 6 (six) hours as needed for wheezing or shortness of breath.   Yes Historical Provider, MD  albuterol (PROVENTIL) (2.5 MG/3ML) 0.083% nebulizer solution Take 2.5 mg by nebulization every 6 (six) hours as needed for wheezing or shortness of breath.   Yes Historical Provider, MD  ALPRAZolam Duanne Moron) 1 MG tablet Take 1 mg by mouth 2 (two) times daily as needed for anxiety.   Yes Historical Provider, MD   budesonide-formoterol (SYMBICORT) 80-4.5 MCG/ACT inhaler Inhale 2 puffs into the lungs 2 (two) times daily.   Yes Historical Provider, MD  calcium acetate (PHOSLO) 667 MG capsule Take 1,334 mg by mouth 3 (three) times daily with meals.    Yes Historical Provider, MD  cinacalcet (SENSIPAR) 30 MG tablet Take 30 mg by mouth daily with breakfast.   Yes Historical Provider, MD  DULoxetine (CYMBALTA) 60 MG capsule Take 60 mg by mouth daily.   Yes Historical Provider, MD  folic acid (FOLVITE) 1 MG tablet Take 1 mg by mouth daily.   Yes Historical Provider, MD  gabapentin (NEURONTIN) 300 MG capsule Take 300 mg by mouth 3 (three) times daily.   Yes Historical Provider, MD  hydrALAZINE (APRESOLINE) 25 MG  tablet Take 25 mg by mouth daily. 04/11/13  Yes Historical Provider, MD  isosorbide mononitrate (IMDUR) 30 MG 24 hr tablet Take 30 mg by mouth daily. 04/11/13  Yes Historical Provider, MD  multivitamin (RENA-VIT) TABS tablet Take 1 tablet by mouth daily.   Yes Historical Provider, MD  omeprazole (PRILOSEC) 20 MG capsule Take 20 mg by mouth daily.   Yes Historical Provider, MD  oxyCODONE-acetaminophen (PERCOCET) 7.5-325 MG per tablet Take 1 tablet by mouth every 4 (four) hours as needed for pain.   Yes Historical Provider, MD  tiZANidine (ZANAFLEX) 4 MG tablet Take 4 mg by mouth every 6 (six) hours as needed for muscle spasms.  12/21/12  Yes Historical Provider, MD   Current Facility-Administered Medications  Medication Dose Route Frequency Provider Last Rate Last Dose  . 0.9 %  sodium chloride infusion   Intravenous Continuous Allie Bossier, MD      . acetaminophen (TYLENOL) tablet 650 mg  650 mg Oral Q6H PRN Allie Bossier, MD       Or  . acetaminophen (TYLENOL) suppository 650 mg  650 mg Rectal Q6H PRN Allie Bossier, MD      . albuterol (PROVENTIL) (2.5 MG/3ML) 0.083% nebulizer solution 2.5 mg  2.5 mg Nebulization Q6H PRN Allie Bossier, MD      . aspirin EC tablet 81 mg  81 mg Oral Daily Allie Bossier, MD      . budesonide-formoterol (SYMBICORT) 80-4.5 MCG/ACT inhaler 2 puff  2 puff Inhalation BID Allie Bossier, MD      . calcium acetate (PHOSLO) capsule 1,334 mg  1,334 mg Oral TID WC Allie Bossier, MD      . Derrill Memo ON 06/03/2013] cinacalcet (SENSIPAR) tablet 30 mg  30 mg Oral Q breakfast Allie Bossier, MD      . docusate sodium (COLACE) capsule 100 mg  100 mg Oral BID Allie Bossier, MD      . DULoxetine (CYMBALTA) DR capsule 60 mg  60 mg Oral Daily Allie Bossier, MD      . enoxaparin (LOVENOX) injection 40 mg  40 mg Subcutaneous Q24H Allie Bossier, MD      . folic acid (FOLVITE) tablet 1 mg  1 mg Oral Daily Allie Bossier, MD      . hydrALAZINE (APRESOLINE) tablet 25 mg  25 mg Oral Daily Allie Bossier, MD      . isosorbide mononitrate (IMDUR) 24 hr tablet 30 mg  30 mg Oral Daily Allie Bossier, MD      . multivitamin (RENA-VIT) tablet 1 tablet  1 tablet Oral Daily Allie Bossier, MD      . oxyCODONE (Oxy IR/ROXICODONE) immediate release tablet 5 mg  5 mg Oral Q4H PRN Allie Bossier, MD      . pantoprazole (PROTONIX) EC tablet 40 mg  40 mg Oral Daily Allie Bossier, MD       Current Outpatient Prescriptions  Medication Sig Dispense Refill  . albuterol (PROVENTIL HFA;VENTOLIN HFA) 108 (90 BASE) MCG/ACT inhaler Inhale 1-2 puffs into the lungs every 6 (six) hours as needed for wheezing or shortness of breath.      Marland Kitchen albuterol (PROVENTIL) (2.5 MG/3ML) 0.083% nebulizer solution Take 2.5 mg by nebulization every 6 (six) hours as needed for wheezing or shortness of breath.      . ALPRAZolam (XANAX) 1 MG tablet Take 1 mg by mouth 2 (two) times daily as needed for anxiety.      Marland Kitchen  budesonide-formoterol (SYMBICORT) 80-4.5 MCG/ACT inhaler Inhale 2 puffs into the lungs 2 (two) times daily.      . calcium acetate (PHOSLO) 667 MG capsule Take 1,334 mg by mouth 3 (three) times daily with meals.       . cinacalcet (SENSIPAR) 30 MG tablet Take 30 mg by mouth daily with breakfast.      . DULoxetine  (CYMBALTA) 60 MG capsule Take 60 mg by mouth daily.      . folic acid (FOLVITE) 1 MG tablet Take 1 mg by mouth daily.      Marland Kitchen gabapentin (NEURONTIN) 300 MG capsule Take 300 mg by mouth 3 (three) times daily.      . hydrALAZINE (APRESOLINE) 25 MG tablet Take 25 mg by mouth daily.      . isosorbide mononitrate (IMDUR) 30 MG 24 hr tablet Take 30 mg by mouth daily.      . multivitamin (RENA-VIT) TABS tablet Take 1 tablet by mouth daily.      Marland Kitchen omeprazole (PRILOSEC) 20 MG capsule Take 20 mg by mouth daily.      Marland Kitchen oxyCODONE-acetaminophen (PERCOCET) 7.5-325 MG per tablet Take 1 tablet by mouth every 4 (four) hours as needed for pain.      Marland Kitchen tiZANidine (ZANAFLEX) 4 MG tablet Take 4 mg by mouth every 6 (six) hours as needed for muscle spasms.        Labs: Basic Metabolic Panel:  Recent Labs Lab 06/02/13 1426  NA 140  K 3.4*  CL 96  CO2 27  GLUCOSE 106*  BUN 19  CREATININE 5.16*  CALCIUM 9.9   Liver Function Tests:  Recent Labs Lab 06/02/13 1426  AST 15  ALT 7  ALKPHOS 89  BILITOT 0.8  PROT 7.7  ALBUMIN 2.9*   No results found for this basename: LIPASE, AMYLASE,  in the last 168 hours No results found for this basename: AMMONIA,  in the last 168 hours CBC:  Recent Labs Lab 06/02/13 1426  WBC 12.8*  NEUTROABS 11.3*  HGB 11.5*  HCT 37.7*  MCV 88.9  PLT 165   Cardiac Enzymes: No results found for this basename: CKTOTAL, CKMB, CKMBINDEX, TROPONINI,  in the last 168 hours CBG: No results found for this basename: GLUCAP,  in the last 168 hours Iron Studies: No results found for this basename: IRON, TIBC, TRANSFERRIN, FERRITIN,  in the last 72 hours Studies/Results: Dg Chest 2 View  06/02/2013   CLINICAL DATA:  Shortness of breath, cough, dialysis patient, smoking history, COPD  EXAM: CHEST  2 VIEW  COMPARISON:  DG CHEST 2V dated 04/13/2013; CT CHEST W/CM dated 04/02/2013; DG CHEST 1V PORT dated 04/02/2013  FINDINGS: Stable mild cardiac enlargement. There are moderately large and  apparently partially loculated bilateral pleural effusions. These appear stable. There is bilateral perihilar opacity somewhat more hyper attenuating and masslike on the left. There is calcification of the aortic arch. There are extensive left upper quadrant calcifications partially visualized on this study, unchanged from the prior examination. This is been described on multiple prior studies.  IMPRESSION: 1. Chronic moderately large loculated pleural effusions 2. Perihilar opacities bilaterally suggesting pulmonary edema. This is mildly more prominent on the left. Follow-up radiographs are recommended to ensure resolution.   Electronically Signed   By: Skipper Cliche M.D.   On: 06/02/2013 15:12    ROS: A comprehensive review of systems was negative except for: Constitutional: positive for chills, fatigue, fevers and malaise Respiratory: positive for cough and dyspnea on exertion Physical  Exam: Filed Vitals:   06/02/13 1416  BP: 128/30  Pulse: 100  Temp: 98.5 F (36.9 C)  TempSrc: Oral  Resp: 20  SpO2: 99%      Weight change:  No intake or output data in the 24 hours ending 06/02/13 1802 BP 128/30  Pulse 100  Temp(Src) 98.5 F (36.9 C) (Oral)  Resp 20  SpO2 99% General appearance: cachectic and fatigued Head: Normocephalic, without obvious abnormality, atraumatic Neck: no adenopathy, no carotid bruit, supple, symmetrical, trachea midline, thyroid not enlarged, symmetric, no tenderness/mass/nodules and +JVD Resp: diminished breath sounds bibasilar, rhonchi bilaterally and wheezes bilaterally Cardio: irregularly irregular rhythm and no rub GI: soft, non-tender; bowel sounds normal; no masses,  no organomegaly Extremities: no edema, s/p right great toe amputation, s/p left transmet amputation, LUE AVG +T/B Dialysis Access:  Dialysis Orders: [facility] on M[facility] he does not know his EDW or any other info regarding his HD orders .  Assessment/Plan: 1.  SOB/hypoxia- CXR  actually looks better than last admission, however he has loculated effusions and hilar infiltrates.  Unclear if this is just pulm edema or infectious, or possible malignancy.  Will plan for HD today for short session to help with UF and hypoxia.  Agree with antibiotics per primary svc.  May require thoracentesis vs. Pigtail placement given loculated effusions. 2.  ESRD -  Normally MWF, will contact home dialysis unit in am for more information regarding prescription and dialysis-related meds 3.  Hypertension/volume  - low BP would hold hydralazine for now.  Pt reports large swings in BP but always drops on HD 4.  Anemia  - will hold off on epo until we get outpt dose tomorrow 5.  Metabolic bone disease -  Cont with binders and sensipar.  Will check phos level in am 6.  Nutrition - severe protein malnutrition and may benefit from dietician eval. 7. Vascular access- LUE AVG +T/B  Donetta Potts, MD [facility] [phone] 06/02/2013, 6:02 PM

## 2013-06-02 NOTE — ED Notes (Signed)
IV attempted x's 3 without success.  IV team paged for same.  Pt alert and oriented per norm. Pt denies any pain or discomfort at this time.

## 2013-06-02 NOTE — Progress Notes (Signed)
ANTIBIOTIC CONSULT NOTE - INITIAL  Pharmacy Consult for Cefepime and Vancomycin Indication: pneumonia  Allergies  Allergen Reactions  . Ambien [Zolpidem Tartrate] Other (See Comments)    Hallucinations    Patient Measurements: Height: 5' 6.14" (168 cm) Weight:  (on a strecher, not able to weigh) IBW/kg (Calculated) : 64.13 Adjusted Body Weight:   Vital Signs: Temp: 99.2 F (37.3 C) (03/12 2015) Temp src: Oral (03/12 2015) BP: 115/48 mmHg (03/12 2030) Pulse Rate: 74 (03/12 2030) Intake/Output from previous day:   Intake/Output from this shift:    Labs:  Recent Labs  06/02/13 1426  WBC 12.8*  HGB 11.5*  PLT 165  CREATININE 5.16*   Estimated Creatinine Clearance: 13.1 ml/min (by C-G formula based on Cr of 5.16). No results found for this basename: VANCOTROUGH, VANCOPEAK, VANCORANDOM, GENTTROUGH, GENTPEAK, GENTRANDOM, TOBRATROUGH, TOBRAPEAK, TOBRARND, AMIKACINPEAK, AMIKACINTROU, AMIKACIN,  in the last 72 hours   Microbiology: No results found for this or any previous visit (from the past 720 hour(s)).  Medical History: Past Medical History  Diagnosis Date  . Hyperlipidemia   . Arthritis   . Claudication   . Anemia   . Chronic diastolic CHF (congestive heart failure) 07/2010    a. 07/2008 Echo EF 50-55%, mild-mod LVH, Gr 1 DD.  Marland Kitchen Peripheral vascular occlusive disease     a. 07/2011 Periph Angio: No signif Ao-illiac dzs, LCF stenosis, 100% LSFA w recon above knee pop and 3 vessel runoff, 100% RSFA w/ recon above knee --> pending L Fem to below Knee pop bypass.  Marland Kitchen History of tobacco abuse   . Hypertension     takes Amlodipine,Metoprolol,and Prinivil daily  . Dyspnea on exertion     with exertion  . Cataract     bilateral  . Dialysis patient   . PUD (peptic ulcer disease) mid 1990's    with GI bleed.  [facility]oscoped in mid 1990's at [facility]  . Stroke 2005    no residual  . Asthma     08/2012  . GERD (gastroesophageal reflux disease)   . History of blood  transfusion     peptic ulcer  . ESRD (end stage renal disease)     a. MWF dialysis in [place] (followed by Dr. Lowanda Foster)  . COPD (chronic obstructive pulmonary disease) 04/01/2013    Medications:  Scheduled:  . aspirin EC  81 mg Oral Daily  . budesonide-formoterol  2 puff Inhalation BID  . calcium acetate  1,334 mg Oral TID WC  . [START ON 06/03/2013] cinacalcet  30 mg Oral Q breakfast  . dextromethorphan-guaiFENesin  1 tablet Oral BID  . docusate sodium  100 mg Oral BID  . DULoxetine  60 mg Oral Daily  . enoxaparin (LOVENOX) injection  40 mg Subcutaneous Q24H  . folic acid  1 mg Oral Daily  . hydrALAZINE  25 mg Oral Daily  . ipratropium-albuterol  3 mL Nebulization Q6H  . isosorbide mononitrate  30 mg Oral Daily  . methylPREDNISolone (SOLU-MEDROL) injection  60 mg Intravenous Q24H  . multivitamin  1 tablet Oral Daily  . pantoprazole  40 mg Oral Daily  . [START ON 06/03/2013] vancomycin  750 mg Intravenous Q M,W,F-HD   Assessment: 63 yr old male ESRD on HD, CAD/PVD HTN, CHF, CVA went home from HD yesterday and developed fever/chils and a cough. He was admitted and Vancomycin and Cefepime were ordered for pharmacy to dose.  Goal of Therapy: Resolution of pneumonia   Plan:  Cefepime, 2 Gm now and after  each HD Vancomycin, 1500  mg now and 750 mg after each HD  Minta Balsam 06/02/2013,9:10 PM

## 2013-06-02 NOTE — ED Notes (Signed)
Sob that started last night pt is dialysis pt per aid MWF pt is slightly confused pt has cough very congested hx of chf

## 2013-06-02 NOTE — H&P (Signed)
[facility] History and Physical  Earl MARTINE FBP:[id number] DOB: February 27, 1951 DOA: 06/02/2013  Referring physician:  PCP: Earl Gainer, MD  Specialists:   Chief Complaint: SOB, chest congestion, mental status change  HPI: [name] is a 63 y.o. BM PMHx  PVD, nonhealing sacral wound, mediastinal lymphadenopathy, HLD, Hx CVA, COPD, atrial fibrillation/flutter, chronic diastolic CHF, ESRD on hemodialysis M/W/F. He is brought here for evaluation of shortness of breath, chest congestion and increased confusion since last night. He denies fevers or chills. He denies productive cough. His last dialysis session was yesterday. His nephrologist is Dr. Lowanda Foster in [place]. Patient states went to his HD on Monday, and initially said did not go to his Wednesday HD appointment but then changed his story to did attend HD on Wednesday. States on home O2 at 2 L via [place] chronically. States became acutely SOB/DOE after his HD session on Wednesday.  NOTE; Patient's proBNP chronically elevated however (02/08/2013) was 27,711-(04/01/2013) 48,968-(06/02/2013) 45,128  Review of Systems: The patient denies anorexia, fever, weight loss,, vision loss, decreased hearing, hoarseness, chest pain, syncope, peripheral edema, balance deficits, hemoptysis, abdominal pain, melena, hematochezia, severe indigestion/heartburn, hematuria, incontinence, genital sores, muscle weakness, suspicious skin lesions, transient blindness, difficulty walking, depression, unusual weight change, abnormal bleeding, enlarged lymph nodes, angioedema, and breast masses.    TRAVEL HISTORY:   Procedure CXR 06/02/2013 1. Chronic moderately large loculated pleural effusions  2. Perihilar opacities bilaterally suggesting pulmonary edema. This  is mildly more prominent on the left. Follow-up radiographs are  recommended to ensure resolution.  Echocardiogram 04/04/2013 - Left ventricle: The cavity size was normal. mild LVH.  -LV EF= 45%  to 50%.  -(grade 1 diastolic dysfunction). - Aortic valve: Valve area: 1.73cm^2(VTI). Valve area: 1.48cm^2 (Vmax). - Left atrium: The atrium was mildly dilated. - Right atrium: The atrium was mildly dilated. - Atrial septum: No defect or patent foramen ovale was identified.    Antibiotics Cefepime 3/12 Vancomycin 3/12   Consultation Dr.Coladonato (nephrology)    Past Medical History  Diagnosis Date  . Hyperlipidemia   . Arthritis   . Claudication   . Anemia   . Chronic diastolic CHF (congestive heart failure) 07/2010    a. 07/2008 Echo EF 50-55%, mild-mod LVH, Gr 1 DD.  Marland Kitchen Peripheral vascular occlusive disease     a. 07/2011 Periph Angio: No signif Ao-illiac dzs, LCF stenosis, 100% LSFA w recon above knee pop and 3 vessel runoff, 100% RSFA w/ recon above knee --> pending L Fem to below Knee pop bypass.  Marland Kitchen History of tobacco abuse   . Hypertension     takes Amlodipine,Metoprolol,and Prinivil daily  . Dyspnea on exertion     with exertion  . Cataract     bilateral  . Dialysis patient   . PUD (peptic ulcer disease) mid 1990's    with GI bleed.  endoscoped in mid 1990's at [facility]  . Stroke 2005    no residual  . Asthma     08/2012  . GERD (gastroesophageal reflux disease)   . History of blood transfusion     peptic ulcer  . ESRD (end stage renal disease)     a. MWF dialysis in [place] (followed by Dr. Lowanda Foster)  . COPD (chronic obstructive pulmonary disease) 04/01/2013   Past Surgical History  Procedure Laterality Date  . Arteriovenous graft placement    . Av fistula placement  12/10/2000    Right brachiocephalic arteriovenous   . Aortagram  07/29/2011    Abdominal  Aortagram  . Cardiac catheterization  08/01/11    Left heart catheterization  . Femoral-popliteal bypass graft  09/09/2011    Procedure: BYPASS GRAFT FEMORAL-POPLITEAL ARTERY;  Surgeon: Serafina Mitchell, MD;  Location: [place];  Service: Vascular;  Laterality: Left;  . Amputation  09/12/2011    Procedure:  AMPUTATION BELOW KNEE;  Surgeon: Serafina Mitchell, MD;  Location: [facility] OR;  Service: Vascular;  Laterality: Left;  TRANSMETATARSAL  . Application of wound vac  10/30/2011    Procedure: APPLICATION OF WOUND VAC;  Surgeon: Serafina Mitchell, MD;  Location: [place];  Service: Vascular;  Laterality: Left;  . Groin debridement  11/01/2011    Procedure: Virl Son DEBRIDEMENT;  Surgeon: Angelia Mould, MD;  Location: [facility] OR;  Service: Vascular;  Laterality: Left;  . Esophagogastroduodenoscopy  11/06/2011    Procedure: ESOPHAGOGASTRODUODENOSCOPY (EGD);  Surgeon: Jerene Bears, MD;  Location: [place];  Service: Gastroenterology;  Laterality: N/A;  . Femoral-popliteal bypass graft  01/02/2012    Procedure: BYPASS GRAFT FEMORAL-POPLITEAL ARTERY;  Surgeon: Serafina Mitchell, MD;  Location: [place];  Service: Vascular;  Laterality: Right;  . Amputation  01/02/2012    Procedure: AMPUTATION RAY;  Surgeon: Serafina Mitchell, MD;  Location: [facility] OR;  Service: Vascular;  Laterality: Right;  . Toe amputation Right     5/13- great toe  . Exchange of a dialysis catheter N/A 07/15/2012    Procedure: EXCHANGE OF A DIALYSIS CATHETER;  Surgeon: Conrad [place], MD;  Location: [place];  Service: Vascular;  Laterality: N/A;  . Av fistula placement Left 07/15/2012    Procedure: ARTERIOVENOUS (AV) FISTULA CREATION;  Surgeon: Conrad [place], MD;  Location: [place];  Service: Vascular;  Laterality: Left;  . Av fistula placement Left 09/07/2012    Procedure: INSERTION OF ARTERIOVENOUS (AV) GORE-TEX GRAFT ARM;  Surgeon: Conrad Adams, MD;  Location: [place];  Service: Vascular;  Laterality: Left;  . Insertion of dialysis catheter Right 09/07/2012    Procedure: INSERTION OF a Femoral DIALYSIS CATHETER;  Surgeon: Conrad [place], MD;  Location: [place];  Service: Vascular;  Laterality: Right;  . Lipoma excision Left 09/07/2012    Procedure: EXCISION Seroma left arm;  Surgeon: Conrad [place], MD;  Location: [place];  Service: Vascular;  Laterality: Left;  . Hernia  repair      umbilical  . Thrombectomy and revision of arterioventous (av) goretex  graft Left 11/30/2012    Procedure: THROMBECTOMY AND REVISION OF ARTERIOVENTOUS (AV) GORETEX  GRAFT;  Surgeon: Angelia Mould, MD;  Location: [place];  Service: Vascular;  Laterality: Left;   Social History:  reports that he has been smoking Cigarettes.  He has a 20 pack-year smoking history. He has never used smokeless tobacco. He reports that he does not drink alcohol or use illicit drugs.    Allergies  Allergen Reactions  . Ambien [Zolpidem Tartrate] Other (See Comments)    Hallucinations    Family History  Problem Relation Age of Onset  . Breast cancer Mother   . Cancer Mother   . Cancer Father   . Anesthesia problems Neg Hx   . Hypotension Neg Hx   . Malignant hyperthermia Neg Hx   . Pseudochol deficiency Neg Hx     Prior to Admission medications   Medication Sig Start Date End Date Taking? Authorizing Provider  albuterol (PROVENTIL) (2.5 MG/3ML) 0.083% nebulizer solution Take 3 mLs (2.5 mg total) by nebulization every 6 (six) hours as needed for  wheezing or shortness of breath. 04/18/13   Erby Pian, FNP  ALPRAZolam Duanne Moron) 1 MG tablet Take 1 tablet (1 mg total) by mouth 2 (two) times daily as needed for anxiety or sleep. 03/03/13   Erby Pian, FNP  benzonatate (TESSALON) 100 MG capsule Take 1 capsule (100 mg total) by mouth 3 (three) times daily as needed. 04/13/13   Erby Pian, FNP  budesonide-formoterol (SYMBICORT) 80-4.5 MCG/ACT inhaler Inhale 2 puffs into the lungs 2 (two) times daily. 04/07/13   Bonnielee Haff, MD  calcium acetate (PHOSLO) 667 MG capsule Take 1,334 mg by mouth 3 (three) times daily with meals.     Historical Provider, MD  cinacalcet (SENSIPAR) 30 MG tablet Take 1 tablet (30 mg total) by mouth daily with breakfast. 04/07/13   Bonnielee Haff, MD  DULoxetine (CYMBALTA) 60 MG capsule Take 60 mg by mouth daily.    Historical Provider, MD  folic acid (FOLVITE)  1 MG tablet Take 1 mg by mouth daily.    Historical Provider, MD  gabapentin (NEURONTIN) 300 MG capsule Take 1 capsule (300 mg total) by mouth 3 (three) times daily. 06/02/13   Erby Pian, FNP  hydrALAZINE (APRESOLINE) 25 MG tablet Take 25 mg by mouth daily. 04/11/13   Historical Provider, MD  isosorbide mononitrate (IMDUR) 30 MG 24 hr tablet Take 30 mg by mouth daily. 04/11/13   Historical Provider, MD  levofloxacin (LEVAQUIN) 500 MG tablet Take 1 tablet (500 mg total) by mouth every other day. For 3 more doses 04/07/13   Bonnielee Haff, MD  multivitamin (RENA-VIT) TABS tablet Take 1 tablet by mouth daily.    Historical Provider, MD  omeprazole (PRILOSEC) 20 MG capsule Take 20 mg by mouth daily.    Historical Provider, MD  oxyCODONE-acetaminophen (PERCOCET) 7.5-325 MG per tablet Take 1 tablet by mouth every 8 (eight) hours as needed for pain. 11/30/12   Samantha J Rhyne, PA-C  predniSONE (DELTASONE) 20 MG tablet Take 3 tabs once daily for 3 days, then take 2 tablets once daily for 3 days, then take 1 tablet once daily for 3 days and then STOP. 04/07/13   Bonnielee Haff, MD  PROAIR HFA 108 (90 BASE) MCG/ACT inhaler Inhale 1 puff into the lungs every 4 (four) hours as needed for wheezing.  10/22/12   Historical Provider, MD  tiZANidine (ZANAFLEX) 4 MG tablet Take 4 mg by mouth every 6 (six) hours as needed for muscle spasms.  12/21/12   Historical Provider, MD   Physical Exam: Filed Vitals:   06/02/13 1416  BP: 128/30  Pulse: 100  Temp: 98.5 F (36.9 C)  TempSrc: Oral  Resp: 20  SpO2: 99%     General: A./O. x3 (does not know when), NAD*  Eyes: He was equal reactive to light and accommodation, icteric (chronic per patient)  Neck: Negative JVD, negative lymphadenopathy  Cardiovascular: Regular rhythm and rate, negative murmurs rubs gallops  Respiratory: Mild inspiratory and expiratory wheezing bilaterally, poor air movement upper lobes/right middle lobe/left lingula, no air movement sounds  by basilar  Abdomen: Soft, nontender, nondistended plus bowel sound  Skin: Negative lacerations/lesion; AVF present right arm  Musculoskeletal: Right arm AVF present  Neurologic: Cranial nerve II through XII intact, follows all commands, tongue/uvula  midline, extremity strength 5/5, sensation intact throughout  Labs on Admission:  Basic Metabolic Panel:  Recent Labs Lab 06/02/13 1426  NA 140  K 3.4*  CL 96  CO2 27  GLUCOSE 106*  BUN 19  CREATININE  5.16*  CALCIUM 9.9   Liver Function Tests:  Recent Labs Lab 06/02/13 1426  AST 15  ALT 7  ALKPHOS 89  BILITOT 0.8  PROT 7.7  ALBUMIN 2.9*   No results found for this basename: LIPASE, AMYLASE,  in the last 168 hours No results found for this basename: AMMONIA,  in the last 168 hours CBC:  Recent Labs Lab 06/02/13 1426  WBC 12.8*  NEUTROABS 11.3*  HGB 11.5*  HCT 37.7*  MCV 88.9  PLT 165   Cardiac Enzymes: No results found for this basename: CKTOTAL, CKMB, CKMBINDEX, TROPONINI,  in the last 168 hours  BNP (last 3 results)  Recent Labs  02/08/13 1903 04/01/13 2030 06/02/13 1426  PROBNP 27711.0* 48968.0* 45128.0*   CBG: No results found for this basename: GLUCAP,  in the last 168 hours  Radiological Exams on Admission: Dg Chest 2 View  06/02/2013   CLINICAL DATA:  Shortness of breath, cough, dialysis patient, smoking history, COPD  EXAM: CHEST  2 VIEW  COMPARISON:  DG CHEST 2V dated 04/13/2013; CT CHEST W/CM dated 04/02/2013; DG CHEST 1V PORT dated 04/02/2013  FINDINGS: Stable mild cardiac enlargement. There are moderately large and apparently partially loculated bilateral pleural effusions. These appear stable. There is bilateral perihilar opacity somewhat more hyper attenuating and masslike on the left. There is calcification of the aortic arch. There are extensive left upper quadrant calcifications partially visualized on this study, unchanged from the prior examination. This is been described on multiple  prior studies.  IMPRESSION: 1. Chronic moderately large loculated pleural effusions 2. Perihilar opacities bilaterally suggesting pulmonary edema. This is mildly more prominent on the left. Follow-up radiographs are recommended to ensure resolution.   Electronically Signed   By: Skipper Cliche M.D.   On: 06/02/2013 15:12    EKG:   Assessment/Plan Active Problems:   CVA   Peripheral vascular occlusive disease   Hyperlipidemia   Pleural effusion   Atrial fibrillation   Atrial flutter   ESRD on hemodialysis   Non-healing surgical wound   COPD (chronic obstructive pulmonary disease)   Mediastinal lymphadenopathy   Volume overload   Mental status change   Systolic and diastolic CHF, chronic   Mental status change  -Patient appears mildly confused. Initially stating that last HD was on Monday, however did not go on Wednesday then changed stories to did go to his Wednesday HD. -Unsure of patient's baseline, no family available to verify baseline. -Patient not moving air well to any of his lobes. May be COPD exacerbation. CXR shows left hilar irregular is opacification in (PNA?) Will empirically start patient on HCAP protocol -Start DuoNeb -Solu-Medrol 60 mg daily -Mucinex DM BID -Respiratory viral panel    HCAP -See mental status change  ESRD on HD M/W/F -Spoke with Dr.Coladonato (nephrology) and he has agreed to see patient -Patient most likely is not need emergent HD however will need HD on Friday.  Systolic and diastolic CHF chronic -Patient's proBNP chronically elevated  (02/08/2013) was 27,711-(04/01/2013) 48,968-(06/02/2013) 45,128. Patient possibly decompensated secondary to volume overload secondary to his ESRD -Obtain troponin x3 -EKG pending  Volume overload  -See ESRD  PVD -Stable -Previous amputation of left metacarpals  Atrial fibrillation/atrial flutter -Currently in NSR by exam will obtain EKG  COPD -See mental status change  Chronic respiratory  failure -Patient on 2 L O2 via [place] at home  Bilateral pleural effusion -Chronic; per patient was told that would resolve with HD. Will need to research further.  Code Status: Full Family Communication: None available Disposition Plan: Per nephrology  Time spent: 90 minutes  Allie Bossier [facility] Pager [phone]  If 7PM-7AM, please contact night-coverage [email or web address] Password [facility] 06/02/2013, 5:11 PM

## 2013-06-02 NOTE — ED Notes (Signed)
Pt resting with no complaints

## 2013-06-03 ENCOUNTER — Encounter (HOSPITAL_COMMUNITY): Payer: Self-pay | Admitting: *Deleted

## 2013-06-03 DIAGNOSIS — G9340 Encephalopathy, unspecified: Secondary | ICD-10-CM

## 2013-06-03 DIAGNOSIS — N039 Chronic nephritic syndrome with unspecified morphologic changes: Secondary | ICD-10-CM

## 2013-06-03 DIAGNOSIS — S88919A Complete traumatic amputation of unspecified lower leg, level unspecified, initial encounter: Secondary | ICD-10-CM

## 2013-06-03 DIAGNOSIS — D631 Anemia in chronic kidney disease: Secondary | ICD-10-CM

## 2013-06-03 DIAGNOSIS — N189 Chronic kidney disease, unspecified: Secondary | ICD-10-CM

## 2013-06-03 LAB — COMPREHENSIVE METABOLIC PANEL
ALBUMIN: 2.6 g/dL — AB (ref 3.5–5.2)
ALT: 8 U/L (ref 0–53)
AST: 16 U/L (ref 0–37)
Alkaline Phosphatase: 88 U/L (ref 39–117)
BILIRUBIN TOTAL: 0.7 mg/dL (ref 0.3–1.2)
BUN: 15 mg/dL (ref 6–23)
CO2: 23 mEq/L (ref 19–32)
CREATININE: 3.77 mg/dL — AB (ref 0.50–1.35)
Calcium: 10.2 mg/dL (ref 8.4–10.5)
Chloride: 97 mEq/L (ref 96–112)
GFR calc Af Amer: 18 mL/min — ABNORMAL LOW (ref >90)
GFR calc non Af Amer: 16 mL/min — ABNORMAL LOW (ref >90)
Glucose, Bld: 100 mg/dL — ABNORMAL HIGH (ref 70–99)
Potassium: 4.1 mEq/L (ref 3.7–5.3)
Sodium: 139 mEq/L (ref 137–147)
TOTAL PROTEIN: 7.4 g/dL (ref 6.0–8.3)

## 2013-06-03 LAB — RESPIRATORY VIRUS PANEL
Adenovirus: NOT DETECTED
INFLUENZA A H1: NOT DETECTED
INFLUENZA A: NOT DETECTED
INFLUENZA B 1: NOT DETECTED
Influenza A H3: NOT DETECTED
METAPNEUMOVIRUS: NOT DETECTED
Parainfluenza 1: NOT DETECTED
Parainfluenza 2: NOT DETECTED
Parainfluenza 3: NOT DETECTED
RESPIRATORY SYNCYTIAL VIRUS A: NOT DETECTED
RESPIRATORY SYNCYTIAL VIRUS B: NOT DETECTED
Rhinovirus: NOT DETECTED

## 2013-06-03 LAB — CBC
HCT: 37.4 % — ABNORMAL LOW (ref 39.0–52.0)
HEMOGLOBIN: 11.6 g/dL — AB (ref 13.0–17.0)
MCH: 27.2 pg (ref 26.0–34.0)
MCHC: 31 g/dL (ref 30.0–36.0)
MCV: 87.6 fL (ref 78.0–100.0)
Platelets: 130 10*3/uL — ABNORMAL LOW (ref 150–400)
RBC: 4.27 MIL/uL (ref 4.22–5.81)
RDW: 20.8 % — AB (ref 11.5–15.5)
WBC: 9.7 10*3/uL (ref 4.0–10.5)

## 2013-06-03 LAB — HEMOGLOBIN A1C
HEMOGLOBIN A1C: 4.9 % (ref <5.7)
MEAN PLASMA GLUCOSE: 94 mg/dL (ref <117)

## 2013-06-03 LAB — TROPONIN I: Troponin I: <0.3 ng/mL (ref <0.30)

## 2013-06-03 LAB — CREATININE, SERUM
Creatinine, Ser: 3.78 mg/dL — ABNORMAL HIGH (ref 0.50–1.35)
GFR calc non Af Amer: 16 mL/min — ABNORMAL LOW (ref >90)
GFR, EST AFRICAN AMERICAN: 18 mL/min — AB (ref >90)

## 2013-06-03 LAB — TSH: TSH: 0.497 u[IU]/mL (ref 0.350–4.500)

## 2013-06-03 LAB — HEPATITIS B SURFACE ANTIGEN: HEP B S AG: NEGATIVE

## 2013-06-03 LAB — PHOSPHORUS: Phosphorus: 3.5 mg/dL (ref 2.3–4.6)

## 2013-06-03 LAB — MRSA PCR SCREENING: MRSA BY PCR: NEGATIVE

## 2013-06-03 LAB — MAGNESIUM: Magnesium: 1.8 mg/dL (ref 1.5–2.5)

## 2013-06-03 MED ORDER — ENOXAPARIN SODIUM 30 MG/0.3ML ~~LOC~~ SOLN
30.0000 mg | SUBCUTANEOUS | Status: DC
  Administered 2013-06-03 – 2013-06-04 (×2): 30 mg via SUBCUTANEOUS
  Filled 2013-06-03 (×3): qty 0.3

## 2013-06-03 MED ORDER — ALPRAZOLAM 0.5 MG PO TABS
0.5000 mg | ORAL_TABLET | Freq: Every evening | ORAL | Status: DC | PRN
  Administered 2013-06-03 – 2013-06-04 (×3): 0.5 mg via ORAL
  Filled 2013-06-03 (×3): qty 1

## 2013-06-03 NOTE — Progress Notes (Signed)
[facility] PROGRESS NOTE  [name] YQM:[id number] DOB: June 19, 1950 DOA: 06/02/2013 PCP: Redge Gainer, MD  Assessment/Plan:  Pulm edema with chronic effusions -denies being symptomatic at this time -multifactorial, has COPD on 2L Home O2 and has chronic partially loculated effusions and pulm edema  -Extra HD as tolerated per Renal -I called and D/w Pulm Dr.Zubilivitsky, Pulm saw pt last admission in 1/15 too, has chronic, partially loculated effusions for a very long time time and pulm recommended no further workup for this. - i stopped Abx, do not see anything to suggest infection at this time   ESRD - Normally MWF regimen   COPD/Chronic resp failure -stable, nebs PRN, on 2L home O2  Hypertension/volume -Bp fluctuates and low in HD -on hydralazine and imdur   Anemia - of chronic disease, stable    Metabolic bone disease - -per Renal   Severe malnutrition -RD consult  PVD  -s/p Toe and TMT amputations  Code Status: Full Code Family Communication: none at bedside Disposition Plan: transfer to floor   Consultants:  Renal  D/W Pulm via telephone  HPI/Subjective: Denies any complaints, DYspnea at baseline with activity  Objective: Filed Vitals:   06/03/13 1326  BP: 172/48  Pulse: 68  Temp:   Resp: 18    Intake/Output Summary (Last 24 hours) at 06/03/13 1332 Last data filed at 06/03/13 0209  Gross per 24 hour  Intake    550 ml  Output   2000 ml  Net  -1450 ml   Filed Weights   06/02/13 2358 06/03/13 0500 06/03/13 1304  Weight: 55.8 kg (123 lb 0.3 oz) 56.4 kg (124 lb 5.4 oz) 57.1 kg (125 lb 14.1 oz)    Exam:   General:  AAOx3  Cardiovascular: S1S2/RRR  Respiratory: Diminished BS at bases  Abdomen: soft, Nt, BS present  Musculoskeletal: no edema c/c,  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, s/p left transmet amputation   Data Reviewed: Basic Metabolic Panel:  Recent Labs Lab 06/02/13 1426 06/03/13 0110  06/03/13 0611  NA 140  --  139  K 3.4*  --  4.1  CL 96  --  97  CO2 27  --  23  GLUCOSE 106*  --  100*  BUN 19  --  15  CREATININE 5.16* 3.78* 3.77*  CALCIUM 9.9  --  10.2  MG  --  1.8  --   PHOS  --  3.5  --    Liver Function Tests:  Recent Labs Lab 06/02/13 1426 06/03/13 0611  AST 15 16  ALT 7 8  ALKPHOS 89 88  BILITOT 0.8 0.7  PROT 7.7 7.4  ALBUMIN 2.9* 2.6*   No results found for this basename: LIPASE, AMYLASE,  in the last 168 hours No results found for this basename: AMMONIA,  in the last 168 hours CBC:  Recent Labs Lab 06/02/13 1426 06/03/13 0110  WBC 12.8* 9.7  NEUTROABS 11.3*  --   HGB 11.5* 11.6*  HCT 37.7* 37.4*  MCV 88.9 87.6  PLT 165 130*   Cardiac Enzymes:  Recent Labs Lab 06/03/13 0110 06/03/13 0449  TROPONINI <0.30 <0.30   BNP (last 3 results)  Recent Labs  02/08/13 1903 04/01/13 2030 06/02/13 1426  PROBNP 27711.0* 48968.0* 45128.0*   CBG: No results found for this basename: GLUCAP,  in the last 168 hours  Recent Results (from the past 240 hour(s))  MRSA PCR SCREENING     Status: None   Collection Time  06/02/13 11:45 PM      Result Value Ref Range Status   MRSA by PCR NEGATIVE  NEGATIVE Final   Comment:            The GeneXpert MRSA Assay (FDA     approved for NASAL specimens     only), is one component of a     comprehensive MRSA colonization     surveillance program. It is not     intended to diagnose MRSA     infection nor to guide or     monitor treatment for     MRSA infections.     Studies: Dg Chest 2 View  06/02/2013   CLINICAL DATA:  Shortness of breath, cough, dialysis patient, smoking history, COPD  EXAM: CHEST  2 VIEW  COMPARISON:  DG CHEST 2V dated 04/13/2013; CT CHEST W/CM dated 04/02/2013; DG CHEST 1V PORT dated 04/02/2013  FINDINGS: Stable mild cardiac enlargement. There are moderately large and apparently partially loculated bilateral pleural effusions. These appear stable. There is bilateral perihilar  opacity somewhat more hyper attenuating and masslike on the left. There is calcification of the aortic arch. There are extensive left upper quadrant calcifications partially visualized on this study, unchanged from the prior examination. This is been described on multiple prior studies.  IMPRESSION: 1. Chronic moderately large loculated pleural effusions 2. Perihilar opacities bilaterally suggesting pulmonary edema. This is mildly more prominent on the left. Follow-up radiographs are recommended to ensure resolution.   Electronically Signed   By: Skipper Cliche M.D.   On: 06/02/2013 15:12    Scheduled Meds: . aspirin EC  81 mg Oral Daily  . budesonide-formoterol  2 puff Inhalation BID  . calcium acetate  1,334 mg Oral TID WC  . cinacalcet  30 mg Oral Q breakfast  . dextromethorphan-guaiFENesin  1 tablet Oral BID  . docusate sodium  100 mg Oral BID  . DULoxetine  60 mg Oral Daily  . enoxaparin (LOVENOX) injection  30 mg Subcutaneous Q24H  . folic acid  1 mg Oral Daily  . hydrALAZINE  25 mg Oral Daily  . ipratropium-albuterol  3 mL Nebulization Q6H  . isosorbide mononitrate  30 mg Oral Daily  . methylPREDNISolone (SOLU-MEDROL) injection  60 mg Intravenous Q24H  . multivitamin  1 tablet Oral Daily  . pantoprazole  40 mg Oral Daily   Continuous Infusions:  Antibiotics Given (last 72 hours)   Date/Time Action Medication Dose Rate   06/03/13 0026 Given   ceFEPIme (MAXIPIME) 2 g in dextrose 5 % 50 mL IVPB 2 g 100 mL/hr   06/03/13 0209 Given   vancomycin (VANCOCIN) 1,500 mg in sodium chloride 0.9 % 500 mL IVPB 1,500 mg 250 mL/hr      Active Problems:   CVA   Peripheral vascular occlusive disease   Hyperlipidemia   Pleural effusion   Atrial fibrillation   Atrial flutter   ESRD on hemodialysis   Non-healing surgical wound   COPD (chronic obstructive pulmonary disease)   Mediastinal lymphadenopathy   Volume overload   Mental status change   Systolic and diastolic CHF,  chronic    Time spent: 56min    [facility] Pager [phone]. If 7PM-7AM, please contact night-coverage at [email or web address], password [facility] 06/03/2013, 1:32 PM  LOS: 1 day

## 2013-06-03 NOTE — Progress Notes (Signed)
Utilization review completed. Dayja Loveridge, RN, BSN. 

## 2013-06-03 NOTE — Procedures (Signed)
Pt seen on HD.  Ap 200 Vp 220. SBP 172.  BFR 400.  Will try for 4L if BP will tolerate

## 2013-06-03 NOTE — Progress Notes (Signed)
S: Breathing a little better O:BP 109/37  Pulse 76  Temp(Src) 98.6 F (37 C) (Other (Comment))  Resp 24  Ht 5\' 6"  (1.676 m)  Wt 56.4 kg (124 lb 5.4 oz)  BMI 20.08 kg/m2  SpO2 96%  Intake/Output Summary (Last 24 hours) at 06/03/13 0823 Last data filed at 06/03/13 0209  Gross per 24 hour  Intake    550 ml  Output   2000 ml  Net  -1450 ml   Weight change:  WJX:BJYNW and alert CVS:RRR Resp:decreased BS in bases with bibasilar crackles Abd:+ BS NTND Ext:no edema.  LUA AVG + bruit NEURO:CNI Ox3 no asterixis   . aspirin EC  81 mg Oral Daily  . budesonide-formoterol  2 puff Inhalation BID  . calcium acetate  1,334 mg Oral TID WC  . cinacalcet  30 mg Oral Q breakfast  . dextromethorphan-guaiFENesin  1 tablet Oral BID  . docusate sodium  100 mg Oral BID  . DULoxetine  60 mg Oral Daily  . enoxaparin (LOVENOX) injection  40 mg Subcutaneous Q24H  . folic acid  1 mg Oral Daily  . hydrALAZINE  25 mg Oral Daily  . ipratropium-albuterol  3 mL Nebulization Q6H  . isosorbide mononitrate  30 mg Oral Daily  . methylPREDNISolone (SOLU-MEDROL) injection  60 mg Intravenous Q24H  . multivitamin  1 tablet Oral Daily  . pantoprazole  40 mg Oral Daily  . vancomycin  750 mg Intravenous Q M,W,F-HD   Dg Chest 2 View  06/02/2013   CLINICAL DATA:  Shortness of breath, cough, dialysis patient, smoking history, COPD  EXAM: CHEST  2 VIEW  COMPARISON:  DG CHEST 2V dated 04/13/2013; CT CHEST W/CM dated 04/02/2013; DG CHEST 1V PORT dated 04/02/2013  FINDINGS: Stable mild cardiac enlargement. There are moderately large and apparently partially loculated bilateral pleural effusions. These appear stable. There is bilateral perihilar opacity somewhat more hyper attenuating and masslike on the left. There is calcification of the aortic arch. There are extensive left upper quadrant calcifications partially visualized on this study, unchanged from the prior examination. This is been described on multiple prior studies.   IMPRESSION: 1. Chronic moderately large loculated pleural effusions 2. Perihilar opacities bilaterally suggesting pulmonary edema. This is mildly more prominent on the left. Follow-up radiographs are recommended to ensure resolution.   Electronically Signed   By: Skipper Cliche M.D.   On: 06/02/2013 15:12   BMET    Component Value Date/Time   NA 139 06/03/2013 0611   K 4.1 06/03/2013 0611   CL 97 06/03/2013 0611   CO2 23 06/03/2013 0611   GLUCOSE 100* 06/03/2013 0611   BUN 15 06/03/2013 0611   CREATININE 3.77* 06/03/2013 0611   CALCIUM 10.2 06/03/2013 0611   GFRNONAA 16* 06/03/2013 0611   GFRAA 18* 06/03/2013 0611   CBC    Component Value Date/Time   WBC 9.7 06/03/2013 0110   RBC 4.27 06/03/2013 0110   RBC 3.05* 09/15/2011 1610   HGB 11.6* 06/03/2013 0110   HCT 37.4* 06/03/2013 0110   PLT 130* 06/03/2013 0110   MCV 87.6 06/03/2013 0110   MCH 27.2 06/03/2013 0110   MCHC 31.0 06/03/2013 0110   RDW 20.8* 06/03/2013 0110   LYMPHSABS 0.9 06/02/2013 1426   MONOABS 0.6 06/02/2013 1426   EOSABS 0.0 06/02/2013 1426   BASOSABS 0.0 06/02/2013 1426     Assessment: 1.  SOB ? PNA vs pulm edema vs both 2. ESRD 3. Sec HPTH 4. Anemia 5. Mild hypercalcemia, will  follow  Plan: 1. HD today 2. Hold hydralazine for SBP < 120  [name] T

## 2013-06-04 DIAGNOSIS — J9600 Acute respiratory failure, unspecified whether with hypoxia or hypercapnia: Secondary | ICD-10-CM

## 2013-06-04 LAB — COMPREHENSIVE METABOLIC PANEL
ALT: 7 U/L (ref 0–53)
AST: 12 U/L (ref 0–37)
Albumin: 2.7 g/dL — ABNORMAL LOW (ref 3.5–5.2)
Alkaline Phosphatase: 102 U/L (ref 39–117)
BUN: 21 mg/dL (ref 6–23)
CO2: 29 mEq/L (ref 19–32)
Calcium: 10.9 mg/dL — ABNORMAL HIGH (ref 8.4–10.5)
Chloride: 95 mEq/L — ABNORMAL LOW (ref 96–112)
Creatinine, Ser: 3.27 mg/dL — ABNORMAL HIGH (ref 0.50–1.35)
GFR calc Af Amer: 21 mL/min — ABNORMAL LOW (ref >90)
GFR calc non Af Amer: 19 mL/min — ABNORMAL LOW (ref >90)
Glucose, Bld: 166 mg/dL — ABNORMAL HIGH (ref 70–99)
Potassium: 3.9 mEq/L (ref 3.7–5.3)
SODIUM: 139 meq/L (ref 137–147)
Total Bilirubin: 0.5 mg/dL (ref 0.3–1.2)
Total Protein: 7.4 g/dL (ref 6.0–8.3)

## 2013-06-04 LAB — CBC
HEMATOCRIT: 36.3 % — AB (ref 39.0–52.0)
Hemoglobin: 11.1 g/dL — ABNORMAL LOW (ref 13.0–17.0)
MCH: 27.2 pg (ref 26.0–34.0)
MCHC: 30.6 g/dL (ref 30.0–36.0)
MCV: 89 fL (ref 78.0–100.0)
Platelets: 182 10*3/uL (ref 150–400)
RBC: 4.08 MIL/uL — AB (ref 4.22–5.81)
RDW: 20.7 % — ABNORMAL HIGH (ref 11.5–15.5)
WBC: 6.7 10*3/uL (ref 4.0–10.5)

## 2013-06-04 MED ORDER — IPRATROPIUM-ALBUTEROL 0.5-2.5 (3) MG/3ML IN SOLN: 3.0000 mL | Freq: Four times a day (QID) | RESPIRATORY_TRACT | Status: DC

## 2013-06-04 MED ORDER — PENTAFLUOROPROP-TETRAFLUOROETH EX AERO: 1.0000 "application " | INHALATION_SPRAY | CUTANEOUS | Status: DC | PRN

## 2013-06-04 MED ORDER — HEPARIN SODIUM (PORCINE) 1000 UNIT/ML DIALYSIS: 1000.0000 [IU] | INTRAMUSCULAR | Status: DC | PRN

## 2013-06-04 MED ORDER — IPRATROPIUM-ALBUTEROL 0.5-2.5 (3) MG/3ML IN SOLN: 3.0000 mL | Freq: Three times a day (TID) | RESPIRATORY_TRACT | Status: DC

## 2013-06-04 MED ORDER — LIDOCAINE-PRILOCAINE 2.5-2.5 % EX CREA
1.0000 "application " | TOPICAL_CREAM | CUTANEOUS | Status: DC | PRN
  Filled 2013-06-04: qty 5

## 2013-06-04 MED ORDER — LIDOCAINE HCL (PF) 1 % IJ SOLN: 5.0000 mL | INTRAMUSCULAR | Status: DC | PRN

## 2013-06-04 MED ORDER — ALBUTEROL SULFATE (2.5 MG/3ML) 0.083% IN NEBU: 2.5000 mg | INHALATION_SOLUTION | RESPIRATORY_TRACT | Status: DC | PRN

## 2013-06-04 MED ORDER — SODIUM CHLORIDE 0.9 % IV SOLN: 100.0000 mL | INTRAVENOUS | Status: DC | PRN

## 2013-06-04 MED ORDER — NEPRO/CARBSTEADY PO LIQD
237.0000 mL | ORAL | Status: DC | PRN
  Filled 2013-06-04: qty 237

## 2013-06-04 MED ORDER — ALTEPLASE 2 MG IJ SOLR
2.0000 mg | Freq: Once | INTRAMUSCULAR | Status: DC | PRN
  Filled 2013-06-04: qty 2

## 2013-06-04 MED ORDER — IPRATROPIUM-ALBUTEROL 0.5-2.5 (3) MG/3ML IN SOLN
3.0000 mL | Freq: Three times a day (TID) | RESPIRATORY_TRACT | Status: DC
  Administered 2013-06-05: 3 mL via RESPIRATORY_TRACT
  Filled 2013-06-04: qty 3

## 2013-06-04 NOTE — Progress Notes (Signed)
S: breathing better.  Pull 2.8l at HD yest O:BP 106/35  Pulse 62  Temp(Src) 97.7 F (36.5 C) (Oral)  Resp 18  Ht 5\' 6"  (1.676 m)  Wt 52.3 kg (115 lb 4.8 oz)  BMI 18.62 kg/m2  SpO2 97%  Intake/Output Summary (Last 24 hours) at 06/04/13 0846 Last data filed at 06/03/13 1654  Gross per 24 hour  Intake      0 ml  Output   2303 ml  Net  -2303 ml   Weight change: -8 kg (-17 lb 10.2 oz) XNA:TFTDD and alert CVS:RRR Resp:decreased BS throughout. Scattered wheeze Abd:+ BS NTNT Ext:no edema.  LUA AVG + bruit NEURO:CNI Ox3 no asterixis   . aspirin EC  81 mg Oral Daily  . budesonide-formoterol  2 puff Inhalation BID  . calcium acetate  1,334 mg Oral TID WC  . cinacalcet  30 mg Oral Q breakfast  . dextromethorphan-guaiFENesin  1 tablet Oral BID  . docusate sodium  100 mg Oral BID  . DULoxetine  60 mg Oral Daily  . enoxaparin (LOVENOX) injection  30 mg Subcutaneous Q24H  . folic acid  1 mg Oral Daily  . hydrALAZINE  25 mg Oral Daily  . ipratropium-albuterol  3 mL Nebulization Q6H  . isosorbide mononitrate  30 mg Oral Daily  . methylPREDNISolone (SOLU-MEDROL) injection  60 mg Intravenous Q24H  . multivitamin  1 tablet Oral Daily  . pantoprazole  40 mg Oral Daily   Dg Chest 2 View  06/02/2013   CLINICAL DATA:  Shortness of breath, cough, dialysis patient, smoking history, COPD  EXAM: CHEST  2 VIEW  COMPARISON:  DG CHEST 2V dated 04/13/2013; CT CHEST W/CM dated 04/02/2013; DG CHEST 1V PORT dated 04/02/2013  FINDINGS: Stable mild cardiac enlargement. There are moderately large and apparently partially loculated bilateral pleural effusions. These appear stable. There is bilateral perihilar opacity somewhat more hyper attenuating and masslike on the left. There is calcification of the aortic arch. There are extensive left upper quadrant calcifications partially visualized on this study, unchanged from the prior examination. This is been described on multiple prior studies.  IMPRESSION: 1. Chronic  moderately large loculated pleural effusions 2. Perihilar opacities bilaterally suggesting pulmonary edema. This is mildly more prominent on the left. Follow-up radiographs are recommended to ensure resolution.   Electronically Signed   By: Skipper Cliche M.D.   On: 06/02/2013 15:12   BMET    Component Value Date/Time   NA 139 06/04/2013 0629   K 3.9 06/04/2013 0629   CL 95* 06/04/2013 0629   CO2 29 06/04/2013 0629   GLUCOSE 166* 06/04/2013 0629   BUN 21 06/04/2013 0629   CREATININE 3.27* 06/04/2013 0629   CALCIUM 10.9* 06/04/2013 0629   GFRNONAA 19* 06/04/2013 0629   GFRAA 21* 06/04/2013 0629   CBC    Component Value Date/Time   WBC 6.7 06/04/2013 0629   RBC 4.08* 06/04/2013 0629   RBC 3.05* 09/15/2011 1610   HGB 11.1* 06/04/2013 0629   HCT 36.3* 06/04/2013 0629   PLT 182 06/04/2013 0629   MCV 89.0 06/04/2013 0629   MCH 27.2 06/04/2013 0629   MCHC 30.6 06/04/2013 0629   RDW 20.7* 06/04/2013 0629   LYMPHSABS 0.9 06/02/2013 1426   MONOABS 0.6 06/02/2013 1426   EOSABS 0.0 06/02/2013 1426   BASOSABS 0.0 06/02/2013 1426     Assessment: 1. CHF 2. ESRD 3. Hypercalcemia 4. Anemia, not on EPO as outpt  Plan: 1. Hold hectorol and phoslo for now  2.  HD again today to try to pull more volume 3  please  Wean steroids   [name] T

## 2013-06-04 NOTE — Progress Notes (Addendum)
[facility] PROGRESS NOTE  TAE VONADA LNL:[id number] DOB: December 29, 1950 DOA: 06/02/2013 PCP: Redge Gainer, MD  Assessment/Plan:  Pulm edema with chronic effusions -denies being symptomatic at this time -multifactorial, has COPD on 2L Home O2 and has chronic partially loculated effusions and pulm edema  -Extra HD as tolerated per Renal, for HD again today -I called and D/w Pulm Dr.Zubilivitsky 3/13, Pulmonary saw pt last admission in 1/15 too, has chronic, partially loculated effusions for a very long time time and pulm recommended no further workup for this. -I stopped Abx, do not see anything to suggest infection at this time   ESRD  - Normally MWF regimen  - as above  COPD/Chronic resp failure -stable, nebs PRN, on 2L home O2  Hypertension/volume -Bp fluctuates and low in HD -on hydralazine and imdur   Anemia - of chronic disease,  - stable    Metabolic bone disease - -per Renal   Severe malnutrition -RD consult  PVD  -s/p Toe and TMT amputations  Code Status: Full Code Family Communication: none at bedside Disposition Plan: transfer to floor   Consultants:  Renal  D/W Pulm via telephone  HPI/Subjective: Denies any complaints, DYspnea at baseline with activity  Objective: Filed Vitals:   06/04/13 1323  BP: 141/33  Pulse: 59  Temp: 97.8 F (36.6 C)  Resp: 16    Intake/Output Summary (Last 24 hours) at 06/04/13 1327 Last data filed at 06/04/13 1322  Gross per 24 hour  Intake     20 ml  Output   2303 ml  Net  -2283 ml   Filed Weights   06/03/13 1304 06/03/13 1654 06/03/13 2100  Weight: 57.1 kg (125 lb 14.1 oz) 51.1 kg (112 lb 10.5 oz) 52.3 kg (115 lb 4.8 oz)    Exam:   General:  AAOx3  Cardiovascular: S1S2/RRR  Respiratory: Diminished BS at bases  Abdomen: soft, Nt, BS present  Musculoskeletal: no edema c/c,  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, s/p left transmet amputation   Data Reviewed: Basic  Metabolic Panel:  Recent Labs Lab 06/02/13 1426 06/03/13 0110 06/03/13 0611 06/04/13 0629  NA 140  --  139 139  K 3.4*  --  4.1 3.9  CL 96  --  97 95*  CO2 27  --  23 29  GLUCOSE 106*  --  100* 166*  BUN 19  --  15 21  CREATININE 5.16* 3.78* 3.77* 3.27*  CALCIUM 9.9  --  10.2 10.9*  MG  --  1.8  --   --   PHOS  --  3.5  --   --    Liver Function Tests:  Recent Labs Lab 06/02/13 1426 06/03/13 0611 06/04/13 0629  AST _0 ALT _1 ALKPHOS 89 88 102  BILITOT 0.8 0.7 0.5  PROT 7.7 7.4 7.4  ALBUMIN 2.9* 2.6* 2.7*   No results found for this basename: LIPASE, AMYLASE,  in the last 168 hours No results found for this basename: AMMONIA,  in the last 168 hours CBC:  Recent Labs Lab 06/02/13 1426 06/03/13 0110 06/04/13 0629  WBC 12.8* 9.7 6.7  NEUTROABS 11.3*  --   --   HGB 11.5* 11.6* 11.1*  HCT 37.7* 37.4* 36.3*  MCV 88.9 87.6 89.0  PLT 165 130* 182   Cardiac Enzymes:  Recent Labs Lab 06/03/13 0110 06/03/13 0449  TROPONINI <0.30 <0.30   BNP (last 3 results)  Recent Labs  02/08/13 1903  04/01/13 2030 06/02/13 1426  PROBNP 27711.0* 48968.0* 45128.0*   CBG: No results found for this basename: GLUCAP,  in the last 168 hours  Recent Results (from the past 240 hour(s))  MRSA PCR SCREENING     Status: None   Collection Time    06/02/13 11:45 PM      Result Value Ref Range Status   MRSA by PCR NEGATIVE  NEGATIVE Final   Comment:            The GeneXpert MRSA Assay (FDA     approved for NASAL specimens     only), is one component of a     comprehensive MRSA colonization     surveillance program. It is not     intended to diagnose MRSA     infection nor to guide or     monitor treatment for     MRSA infections.  RESPIRATORY VIRUS PANEL     Status: None   Collection Time    06/02/13 11:46 PM      Result Value Ref Range Status   Source - RVPAN NASAL SWAB   Corrected   Comment: CORRECTED ON 03/13 AT 2240: PREVIOUSLY REPORTED AS NASAL SWAB    Respiratory Syncytial Virus A NOT DETECTED   Final   Respiratory Syncytial Virus B NOT DETECTED   Final   Influenza A NOT DETECTED   Final   Influenza B NOT DETECTED   Final   Parainfluenza 1 NOT DETECTED   Final   Parainfluenza 2 NOT DETECTED   Final   Parainfluenza 3 NOT DETECTED   Final   Metapneumovirus NOT DETECTED   Final   Rhinovirus NOT DETECTED   Final   Adenovirus NOT DETECTED   Final   Influenza A H1 NOT DETECTED   Final   Influenza A H3 NOT DETECTED   Final   Comment: (NOTE)           Normal Reference Range for each Analyte: NOT DETECTED     Testing performed using the Luminex xTAG Respiratory Viral Panel test     kit.     This test was developed and its performance characteristics determined     by [facility]. It has not been cleared or approved by the [place]     Food and Drug Administration. This test is used for clinical purposes.     It should not be regarded as investigational or for research. This     laboratory is certified under the [place] (CLIA) as qualified to perform high complexity     clinical laboratory testing.     Performed at [facility]     Studies: Dg Chest 2 View  06/02/2013   CLINICAL DATA:  Shortness of breath, cough, dialysis patient, smoking history, COPD  EXAM: CHEST  2 VIEW  COMPARISON:  DG CHEST 2V dated 04/13/2013; CT CHEST W/CM dated 04/02/2013; DG CHEST 1V PORT dated 04/02/2013  FINDINGS: Stable mild cardiac enlargement. There are moderately large and apparently partially loculated bilateral pleural effusions. These appear stable. There is bilateral perihilar opacity somewhat more hyper attenuating and masslike on the left. There is calcification of the aortic arch. There are extensive left upper quadrant calcifications partially visualized on this study, unchanged from the prior examination. This is been described on multiple prior studies.  IMPRESSION: 1. Chronic moderately large  loculated pleural effusions 2. Perihilar opacities bilaterally suggesting pulmonary edema. This is mildly  more prominent on the left. Follow-up radiographs are recommended to ensure resolution.   Electronically Signed   By: Skipper Cliche M.D.   On: 06/02/2013 15:12    Scheduled Meds: . aspirin EC  81 mg Oral Daily  . budesonide-formoterol  2 puff Inhalation BID  . cinacalcet  30 mg Oral Q breakfast  . dextromethorphan-guaiFENesin  1 tablet Oral BID  . docusate sodium  100 mg Oral BID  . DULoxetine  60 mg Oral Daily  . enoxaparin (LOVENOX) injection  30 mg Subcutaneous Q24H  . folic acid  1 mg Oral Daily  . hydrALAZINE  25 mg Oral Daily  . ipratropium-albuterol  3 mL Nebulization Q6H  . isosorbide mononitrate  30 mg Oral Daily  . multivitamin  1 tablet Oral Daily  . pantoprazole  40 mg Oral Daily   Continuous Infusions:  Antibiotics Given (last 72 hours)   Date/Time Action Medication Dose Rate   06/03/13 0026 Given   ceFEPIme (MAXIPIME) 2 g in dextrose 5 % 50 mL IVPB 2 g 100 mL/hr   06/03/13 0209 Given   vancomycin (VANCOCIN) 1,500 mg in sodium chloride 0.9 % 500 mL IVPB 1,500 mg 250 mL/hr      Active Problems:   CVA   Peripheral vascular occlusive disease   Hyperlipidemia   Pleural effusion   Atrial fibrillation   Atrial flutter   ESRD on hemodialysis   Non-healing surgical wound   COPD (chronic obstructive pulmonary disease)   Mediastinal lymphadenopathy   Volume overload   Mental status change   Systolic and diastolic CHF, chronic    Time spent: 88mn    [facility] Pager [phone] If 7PM-7AM, please contact night-coverage at [email or web address], password [email or web address]3/14/2015, 1:27 PM  LOS: 2 days

## 2013-06-05 LAB — COMPREHENSIVE METABOLIC PANEL WITH GFR
ALT: 10 U/L (ref 0–53)
AST: 15 U/L (ref 0–37)
Albumin: 2.7 g/dL — ABNORMAL LOW (ref 3.5–5.2)
Alkaline Phosphatase: 78 U/L (ref 39–117)
BUN: 18 mg/dL (ref 6–23)
CO2: 29 meq/L (ref 19–32)
Calcium: 10.4 mg/dL (ref 8.4–10.5)
Chloride: 96 meq/L (ref 96–112)
Creatinine, Ser: 2.98 mg/dL — ABNORMAL HIGH (ref 0.50–1.35)
GFR calc Af Amer: 24 mL/min — ABNORMAL LOW (ref >90)
GFR calc non Af Amer: 21 mL/min — ABNORMAL LOW (ref >90)
Glucose, Bld: 102 mg/dL — ABNORMAL HIGH (ref 70–99)
Potassium: 3.2 meq/L — ABNORMAL LOW (ref 3.7–5.3)
Sodium: 140 meq/L (ref 137–147)
Total Bilirubin: 0.4 mg/dL (ref 0.3–1.2)
Total Protein: 7.1 g/dL (ref 6.0–8.3)

## 2013-06-05 NOTE — Progress Notes (Signed)
S: breathing better.  Pull 2.8l at HD yest O:BP 95/57  Pulse 68  Temp(Src) 98.3 F (36.8 C) (Oral)  Resp 18  Ht 5' 6.14" (1.68 m)  Wt 55.067 kg (121 lb 6.4 oz)  BMI 19.51 kg/m2  SpO2 95%  Intake/Output Summary (Last 24 hours) at 06/05/13 0935 Last data filed at 06/05/13 0700  Gross per 24 hour  Intake    720 ml  Output    624 ml  Net     96 ml   Weight change: -1.5 kg (-3 lb 4.9 oz) JHE:RDEYC and alert CVS:RRR Resp:decreased BS throughout Abd:+ BS NTNT Ext:no edema.  LUA AVG + bruit NEURO:CNI Ox3 no asterixis   . aspirin EC  81 mg Oral Daily  . budesonide-formoterol  2 puff Inhalation BID  . cinacalcet  30 mg Oral Q breakfast  . dextromethorphan-guaiFENesin  1 tablet Oral BID  . docusate sodium  100 mg Oral BID  . DULoxetine  60 mg Oral Daily  . enoxaparin (LOVENOX) injection  30 mg Subcutaneous Q24H  . folic acid  1 mg Oral Daily  . hydrALAZINE  25 mg Oral Daily  . ipratropium-albuterol  3 mL Nebulization TID  . isosorbide mononitrate  30 mg Oral Daily  . multivitamin  1 tablet Oral Daily  . pantoprazole  40 mg Oral Daily   No results found. BMET    Component Value Date/Time   NA 140 06/05/2013 0623   K 3.2* 06/05/2013 0623   CL 96 06/05/2013 0623   CO2 29 06/05/2013 0623   GLUCOSE 102* 06/05/2013 0623   BUN 18 06/05/2013 0623   CREATININE 2.98* 06/05/2013 0623   CALCIUM 10.4 06/05/2013 0623   GFRNONAA 21* 06/05/2013 0623   GFRAA 24* 06/05/2013 0623   CBC    Component Value Date/Time   WBC 6.7 06/04/2013 0629   RBC 4.08* 06/04/2013 0629   RBC 3.05* 09/15/2011 1610   HGB 11.1* 06/04/2013 0629   HCT 36.3* 06/04/2013 0629   PLT 182 06/04/2013 0629   MCV 89.0 06/04/2013 0629   MCH 27.2 06/04/2013 0629   MCHC 30.6 06/04/2013 0629   RDW 20.7* 06/04/2013 0629   LYMPHSABS 0.9 06/02/2013 1426   MONOABS 0.6 06/02/2013 1426   EOSABS 0.0 06/02/2013 1426   BASOSABS 0.0 06/02/2013 1426     Assessment: 1. CHF 2. ESRD 3. Hypercalcemia 4. Anemia, not on EPO as  outpt  Plan: 1. DC hydralazine  2. Spoke with Dr Broadus John and plan is for DC today to FU at outpt unit. 3. Leave off phoslo and hectorol for now 4. My PA Ernest Haber will call his unit in AM and notify them of his return and DW change  Glorene Leitzke T

## 2013-06-05 NOTE — Discharge Summary (Signed)
Physician Discharge Summary  [name] VCB:[id number] DOB: [birth year] DOA: 06/02/2013  PCP: Earl Gainer, MD  Admit date: 06/02/2013 Discharge date: 06/05/2013  Time spent:45 minutes  Recommendations for Outpatient Follow-up:  1. PCP in 1 week 2. Dr. Lowanda [name], does he need dry weight lowered  Discharge Diagnoses:    Volume overload   Chronic pleural effusions,    CVA   Peripheral vascular occlusive disease   Hyperlipidemia   Pleural effusion   Atrial fibrillation   Atrial flutter   ESRD on hemodialysis   Non-healing surgical wound   COPD (chronic obstructive pulmonary disease)   Mediastinal lymphadenopathy   Volume overload   Mental status change   Systolic and diastolic CHF, chronic   Discharge Condition: stable  Diet recommendation: Renal  Filed Weights   06/04/13 1355 06/04/13 1744 06/04/13 2019  Weight: 55.6 kg (122 lb 9.2 oz) 55 kg (121 lb 4.1 oz) 55.067 kg (121 lb 6.4 oz)    History of present illness:   [name] is a 63 y.o. BM PMHx PVD, nonhealing sacral wound, mediastinal lymphadenopathy, HLD, Hx CVA, COPD, atrial fibrillation/flutter, chronic diastolic CHF, ESRD on hemodialysis M/W/F. He is brought here for evaluation of shortness of breath, chest congestion and increased confusion since last night. He denies fevers or chills. He denies productive cough. His last dialysis session was yesterday. His nephrologist is Dr. Lowanda [name] in [place]. Patient states went to his HD on Monday, and initially said did not go to his Wednesday HD appointment but then changed his story to did attend HD on Wednesday. States on home O2 at 2 L via  chronically. States became acutely SOB/DOE after his HD session on Wednesday.    Hospital Course:  Pulm edema with chronic effusions  -denies being symptomatic at this time  -multifactorial, has COPD on 2L Home O2 and has chronic partially loculated effusions and pulm edema  -Was Dialyzed extra per Renal -I called and  D/w Pulm [name] 3/13, Pulmonary saw pt last admission in 1/15 too, has chronic, partially loculated effusions for a very long time time and pulm recommended no further workup for this.  -I stopped Abx, do not see anything to suggest infection at this time  -stopped BP meds since BP soft/low, has H/o BP fluctuations and we hope that if his BP is higher he can tolerate more volume removal with HD  ESRD  - Normally MWF regimen  - as above   COPD/Chronic resp failure  -stable, nebs PRN, on 2L home O2   Hypertension/volume  -Bp fluctuates and low in HD  - Stopped  hydralazine and imdur since BP soft to low and hopefully be able to tolerate more volume removal at HD if BPs higher Anemia - of chronic disease,  - stable   Metabolic bone disease -  -per Renal   Severe malnutrition  -RD consulted   Consultations:  Renal  Discharge Exam: Filed Vitals:   06/05/13 0856  BP: 95/57  Pulse: 68  Temp: 98.3 F (36.8 C)  Resp: 18    General: AAOx3 Cardiovascular: S1S2/RRR Respiratory: Diminshed at bases  Discharge Instructions  Discharge Orders   Future Appointments Provider Department Dept Phone   06/08/2013 4:30 PM [name], [facility] [phone]   Future Orders Complete By Expires   Discharge instructions  As directed    Comments:     Renal Diet   Increase activity slowly  As directed        Medication  List    STOP taking these medications       hydrALAZINE 25 MG tablet  Commonly known as:  APRESOLINE     isosorbide mononitrate 30 MG 24 hr tablet  Commonly known as:  IMDUR      TAKE these medications       albuterol (2.5 MG/3ML) 0.083% nebulizer solution  Commonly known as:  PROVENTIL  Take 2.5 mg by nebulization every 6 (six) hours as needed for wheezing or shortness of breath.     albuterol 108 (90 BASE) MCG/ACT inhaler  Commonly known as:  PROVENTIL HFA;VENTOLIN HFA  Inhale 1-2 puffs into the lungs every 6  (six) hours as needed for wheezing or shortness of breath.     ALPRAZolam 1 MG tablet  Commonly known as:  XANAX  Take 1 mg by mouth 2 (two) times daily as needed for anxiety.     budesonide-formoterol 80-4.5 MCG/ACT inhaler  Commonly known as:  SYMBICORT  Inhale 2 puffs into the lungs 2 (two) times daily.     cinacalcet 30 MG tablet  Commonly known as:  SENSIPAR  Take 30 mg by mouth daily with breakfast.     DULoxetine 60 MG capsule  Commonly known as:  CYMBALTA  Take 60 mg by mouth daily.     folic acid 1 MG tablet  Commonly known as:  FOLVITE  Take 1 mg by mouth daily.     gabapentin 300 MG capsule  Commonly known as:  NEURONTIN  Take 300 mg by mouth 3 (three) times daily.     multivitamin Tabs tablet  Take 1 tablet by mouth daily.     omeprazole 20 MG capsule  Commonly known as:  PRILOSEC  Take 20 mg by mouth daily.     oxyCODONE-acetaminophen 7.5-325 MG per tablet  Commonly known as:  PERCOCET  Take 1 tablet by mouth every 4 (four) hours as needed for pain.     PHOSLO 667 MG capsule  Generic drug:  calcium acetate  Take 1,334 mg by mouth 3 (three) times daily with meals.     tiZANidine 4 MG tablet  Commonly known as:  ZANAFLEX  Take 4 mg by mouth every 6 (six) hours as needed for muscle spasms.       Allergies  Allergen Reactions  . Ambien [Zolpidem Tartrate] Other (See Comments)    Hallucinations      The results of significant diagnostics from this hospitalization (including imaging, microbiology, ancillary and laboratory) are listed below for reference.    Significant Diagnostic Studies: Dg Chest 2 View  06/02/2013   CLINICAL DATA:  Shortness of breath, cough, dialysis patient, smoking history, COPD  EXAM: CHEST  2 VIEW  COMPARISON:  DG CHEST 2V dated 04/13/2013; CT CHEST W/CM dated 04/02/2013; DG CHEST 1V PORT dated 04/02/2013  FINDINGS: Stable mild cardiac enlargement. There are moderately large and apparently partially loculated bilateral pleural  effusions. These appear stable. There is bilateral perihilar opacity somewhat more hyper attenuating and masslike on the left. There is calcification of the aortic arch. There are extensive left upper quadrant calcifications partially visualized on this study, unchanged from the prior examination. This is been described on multiple prior studies.  IMPRESSION: 1. Chronic moderately large loculated pleural effusions 2. Perihilar opacities bilaterally suggesting pulmonary edema. This is mildly more prominent on the left. Follow-up radiographs are recommended to ensure resolution.   Electronically Signed   By: Skipper Cliche M.D.   On: 06/02/2013 15:12    Microbiology:  Recent Results (from the past 240 hour(s))  MRSA PCR SCREENING     Status: None   Collection Time    06/02/13 11:45 PM      Result Value Ref Range Status   MRSA by PCR NEGATIVE  NEGATIVE Final   Comment:            The GeneXpert MRSA Assay (FDA     approved for NASAL specimens     only), is one component of a     comprehensive MRSA colonization     surveillance program. It is not     intended to diagnose MRSA     infection nor to guide or     monitor treatment for     MRSA infections.  RESPIRATORY VIRUS PANEL     Status: None   Collection Time    06/02/13 11:46 PM      Result Value Ref Range Status   Source - RVPAN NASAL SWAB   Corrected   Comment: CORRECTED ON 03/13 AT 2240: PREVIOUSLY REPORTED AS NASAL SWAB   Respiratory Syncytial Virus A NOT DETECTED   Final   Respiratory Syncytial Virus B NOT DETECTED   Final   Influenza A NOT DETECTED   Final   Influenza B NOT DETECTED   Final   Parainfluenza 1 NOT DETECTED   Final   Parainfluenza 2 NOT DETECTED   Final   Parainfluenza 3 NOT DETECTED   Final   Metapneumovirus NOT DETECTED   Final   Rhinovirus NOT DETECTED   Final   Adenovirus NOT DETECTED   Final   Influenza A H1 NOT DETECTED   Final   Influenza A H3 NOT DETECTED   Final   Comment: (NOTE)           Normal  Reference Range for each Analyte: NOT DETECTED     Testing performed using the Luminex xTAG Respiratory Viral Panel test     kit.     This test was developed and its performance characteristics determined     by [facility]. It has not been cleared or approved by the [place]     Food and Drug Administration. This test is used for clinical purposes.     It should not be regarded as investigational or for research. This     laboratory is certified under the [place] (CLIA) as qualified to perform high complexity     clinical laboratory testing.     Performed at [facility]: Basic Metabolic Panel:  Recent Labs Lab 06/02/13 1426 06/03/13 0110 06/03/13 0158 06/04/13 0629 06/05/13 0623  NA 140  --  139 139 140  K 3.4*  --  4.1 3.9 3.2*  CL 96  --  97 95* 96  CO2 27  --  23 29 29   GLUCOSE 106*  --  100* 166* 102*  BUN 19  --  15 21 18   CREATININE 5.16* 3.78* 3.77* 3.27* 2.98*  CALCIUM 9.9  --  10.2 10.9* 10.4  MG  --  1.8  --   --   --   PHOS  --  3.5  --   --   --    Liver Function Tests:  Recent Labs Lab 06/02/13 1426 06/03/13 0611 06/04/13 0629 06/05/13 0623  AST 15 16 12 15   ALT 7 8 7 10   ALKPHOS 89 88 102 78  BILITOT 0.8 0.7 0.5  0.4  PROT 7.7 7.4 7.4 7.1  ALBUMIN 2.9* 2.6* 2.7* 2.7*   No results found for this basename: LIPASE, AMYLASE,  in the last 168 hours No results found for this basename: AMMONIA,  in the last 168 hours CBC:  Recent Labs Lab 06/02/13 1426 06/03/13 0110 06/04/13 0629  WBC 12.8* 9.7 6.7  NEUTROABS 11.3*  --   --   HGB 11.5* 11.6* 11.1*  HCT 37.7* 37.4* 36.3*  MCV 88.9 87.6 89.0  PLT 165 130* 182   Cardiac Enzymes:  Recent Labs Lab 06/03/13 0110 06/03/13 0449  TROPONINI <0.30 <0.30   BNP: BNP (last 3 results)  Recent Labs  02/08/13 1903 04/01/13 2030 06/02/13 1426  PROBNP 27711.0* 48968.0* 45128.0*   CBG: No results found for this basename: GLUCAP,   in the last 168 hours     Signed:  Saran Laviolette  [facility] 06/05/2013, 11:16 AM

## 2013-06-08 ENCOUNTER — Ambulatory Visit (INDEPENDENT_AMBULATORY_CARE_PROVIDER_SITE_OTHER): Payer: Medicare Other | Admitting: Family Medicine

## 2013-06-08 VITALS — Temp 96.7°F

## 2013-06-08 DIAGNOSIS — J069 Acute upper respiratory infection, unspecified: Secondary | ICD-10-CM

## 2013-06-08 DIAGNOSIS — I6350 Cerebral infarction due to unspecified occlusion or stenosis of unspecified cerebral artery: Secondary | ICD-10-CM

## 2013-06-08 MED ORDER — AMOXICILLIN 875 MG PO TABS: 875.0000 mg | ORAL_TABLET | Freq: Two times a day (BID) | ORAL | Status: DC

## 2013-06-08 MED ORDER — METHYLPREDNISOLONE (PAK) 4 MG PO TABS: ORAL_TABLET | ORAL | Status: DC

## 2013-06-08 NOTE — Progress Notes (Signed)
   Subjective:    Patient ID: [name], male    DOB: 10-10-1950, 64 y.o.   MRN: [id number]  HPI This 63 y.o. male presents for evaluation of URI sx's.  He has ESRD and has had dialysis today And his daughter states his bp medicine was discontinued.  He has difficulty getting BP in bilateral Arms.  He has chronic arthritis pain. Review of Systems C/o uri sx's and arthritis No chest pain, SOB, HA, dizziness, vision change, N/V, diarrhea, constipation, dysuria, urinary urgency or frequency, myalgias, arthralgias or rash.     Objective:   Physical Exam Vital signs noted  Well developed well nourished male.  HEENT - Head atraumatic Normocephalic                Throat - oropharanx wnl Respiratory - Lungs CTA bilateral Cardiac - RRR S1 and S2 without murmur CV - AV shunt right forearm      Assessment & Plan:  URI (upper respiratory infection) - Plan: methylPREDNIsolone (MEDROL DOSPACK) 4 MG tablet, amoxicillin (AMOXIL) 875 MG tablet  Arthritis pain - He gets oxcodone from NP at neurology and recommend he follow up there for pain meds and instructed that the medrol dose pack will help with arthritis pain.  ESRD - Discussed with daughter that his bp med was probably held by dr. At [facility] because  His bp is low after dialysis tx's.    [name] FNP

## 2013-06-09 ENCOUNTER — Telehealth: Payer: Self-pay | Admitting: *Deleted

## 2013-06-09 NOTE — Telephone Encounter (Signed)
Xanax refills called to [facility] . Qty 60 with 2 refills per M. Haliburton.

## 2013-06-21 ENCOUNTER — Ambulatory Visit: Payer: Medicare Other | Admitting: General Practice

## 2013-07-14 ENCOUNTER — Encounter: Payer: Self-pay | Admitting: General Practice

## 2013-07-14 ENCOUNTER — Ambulatory Visit (INDEPENDENT_AMBULATORY_CARE_PROVIDER_SITE_OTHER): Payer: Medicare Other | Admitting: General Practice

## 2013-07-14 VITALS — BP 122/62 | HR 69 | Temp 96.7°F | Ht 64.0 in | Wt 133.2 lb

## 2013-07-14 DIAGNOSIS — J069 Acute upper respiratory infection, unspecified: Secondary | ICD-10-CM

## 2013-07-14 DIAGNOSIS — I6350 Cerebral infarction due to unspecified occlusion or stenosis of unspecified cerebral artery: Secondary | ICD-10-CM

## 2013-07-14 MED ORDER — AZITHROMYCIN 250 MG PO TABS: ORAL_TABLET | ORAL | Status: DC

## 2013-07-14 NOTE — Progress Notes (Signed)
   Subjective:    Patient ID: [name], male    DOB: [birth year], 63 y.o.   MRN: [id number]  Cough This is a new problem. The current episode started in the past 7 days. The problem has been unchanged. The cough is non-productive. Pertinent negatives include no chest pain, chills, fever, headaches, nasal congestion, postnasal drip or shortness of breath. The symptoms are aggravated by lying down. He has tried nothing for the symptoms. His past medical history is significant for pneumonia. There is no history of asthma or bronchitis.  Patients reports nephrologist discontinued most medications for now.     Review of Systems  Constitutional: Negative for fever and chills.  HENT: Negative for postnasal drip.   Respiratory: Positive for cough. Negative for chest tightness and shortness of breath.   Cardiovascular: Negative for chest pain and palpitations.  Neurological: Negative for dizziness and headaches.       Objective:   Physical Exam  Constitutional: He is oriented to person, place, and time. He appears well-developed and well-nourished.  Cardiovascular: Normal rate, regular rhythm and normal heart sounds.   Pulmonary/Chest: Effort normal. No respiratory distress. He has decreased breath sounds in the right lower field and the left lower field. He exhibits no tenderness.  Neurological: He is alert and oriented to person, place, and time.  Skin: Skin is warm and dry.  Psychiatric: He has a normal mood and affect.          Assessment & Plan:  1. Upper respiratory infection - azithromycin (ZITHROMAX) 250 MG tablet; Take as prescribed  Dispense: 6 tablet; Refill: 0 -RTO in 2 weeks for follow up and sooner if symptoms worsen -may seek emergency medical treatment -Patient verbalized understanding Erby Pian, FNP-C

## 2013-07-14 NOTE — Patient Instructions (Signed)

## 2013-07-19 ENCOUNTER — Ambulatory Visit: Payer: Medicare Other | Admitting: General Practice

## 2013-07-25 ENCOUNTER — Telehealth: Payer: Self-pay | Admitting: Family Medicine

## 2013-07-25 NOTE — Telephone Encounter (Signed)
Appt given for in the am with Rush Landmark

## 2013-07-26 ENCOUNTER — Ambulatory Visit (INDEPENDENT_AMBULATORY_CARE_PROVIDER_SITE_OTHER): Payer: Medicare Other

## 2013-07-26 ENCOUNTER — Encounter: Payer: Self-pay | Admitting: Family Medicine

## 2013-07-26 ENCOUNTER — Ambulatory Visit (INDEPENDENT_AMBULATORY_CARE_PROVIDER_SITE_OTHER): Payer: Medicare Other | Admitting: Family Medicine

## 2013-07-26 VITALS — BP 129/65 | HR 90 | Temp 98.2°F | Ht 64.0 in | Wt 134.6 lb

## 2013-07-26 DIAGNOSIS — R059 Cough, unspecified: Secondary | ICD-10-CM

## 2013-07-26 DIAGNOSIS — R05 Cough: Secondary | ICD-10-CM

## 2013-07-26 DIAGNOSIS — R0602 Shortness of breath: Secondary | ICD-10-CM

## 2013-07-26 DIAGNOSIS — I6350 Cerebral infarction due to unspecified occlusion or stenosis of unspecified cerebral artery: Secondary | ICD-10-CM

## 2013-07-26 DIAGNOSIS — J441 Chronic obstructive pulmonary disease with (acute) exacerbation: Secondary | ICD-10-CM

## 2013-07-26 DIAGNOSIS — J209 Acute bronchitis, unspecified: Secondary | ICD-10-CM

## 2013-07-26 MED ORDER — AMOXICILLIN 875 MG PO TABS: 875.0000 mg | ORAL_TABLET | Freq: Two times a day (BID) | ORAL | Status: DC

## 2013-07-26 MED ORDER — BENZONATATE 100 MG PO CAPS: 100.0000 mg | ORAL_CAPSULE | Freq: Three times a day (TID) | ORAL | Status: DC | PRN

## 2013-07-26 MED ORDER — PREDNISONE 10 MG PO TABS: ORAL_TABLET | ORAL | Status: DC

## 2013-07-26 MED ORDER — METHYLPREDNISOLONE ACETATE 80 MG/ML IJ SUSP
80.0000 mg | Freq: Once | INTRAMUSCULAR | Status: AC
Start: 2013-07-26 — End: 2013-07-26
  Administered 2013-07-26: 80 mg via INTRAMUSCULAR

## 2013-07-26 MED ORDER — ALBUTEROL SULFATE HFA 108 (90 BASE) MCG/ACT IN AERS: 2.0000 | INHALATION_SPRAY | Freq: Four times a day (QID) | RESPIRATORY_TRACT | Status: DC | PRN

## 2013-07-26 NOTE — Progress Notes (Signed)
   Subjective:    Patient ID: KYAIRE GRUENEWALD, male    DOB: Jan 25, 1951, 63 y.o.   MRN: [id number]  HPI This 63 y.o. male presents for evaluation of cough and uri sx's.  He has been having a lot Of night coughing spells and he has hx of copd and tobacco abuse.  He has had sx's for 3 weeks And has worsened over the last week.  He has hx of ESRD and CHF.  He has had problems with chronic pleural effusion  And overload and CHF.  He has had CXR today   Review of Systems C/o cough and uri sx's   No chest pain, SOB, HA, dizziness, vision change, N/V, diarrhea, constipation, dysuria, urinary urgency or frequency, myalgias, arthralgias or rash.  Objective:   Physical Exam   Vital signs noted  Chronicall ill appearing male.  HEENT - Head atraumatic Normocephalic                Eyes - PERRLA, Conjuctiva - clear Sclera- Clear EOMI                Ears - EAC's Wnl TM's Wnl Gross Hearing WNL                Nose - Nares patent                 Throat - oropharanx wnl Respiratory - Lungs diminished bilateral Cardiac - RRR S1 and S2 without murmur GI - Abdomen soft Nontender and bowel sounds active x 4 Extremities - No edema. Neuro - Grossly intact.    CXR - Pleural effusion improved since last CXR Prelimnary reading by Gwyndolyn Saxon [place],FNP Assessment & Plan:  SOB (shortness of breath) - Plan: DG Chest 2 View, predniSONE (DELTASONE) 10 MG tablet, albuterol (PROVENTIL HFA;VENTOLIN HFA) 108 (90 BASE) MCG/ACT inhaler, amoxicillin (AMOXIL) 875 MG tablet, benzonatate (TESSALON PERLES) 100 MG capsule, methylPREDNISolone acetate (DEPO-MEDROL) injection 80 mg  Cough - Plan: DG Chest 2 View, predniSONE (DELTASONE) 10 MG tablet, albuterol (PROVENTIL HFA;VENTOLIN HFA) 108 (90 BASE) MCG/ACT inhaler, amoxicillin (AMOXIL) 875 MG tablet, benzonatate (TESSALON PERLES) 100 MG capsule, methylPREDNISolone acetate (DEPO-MEDROL) injection 80 mg  COPD exacerbation - Plan: predniSONE (DELTASONE) 10 MG tablet, albuterol  (PROVENTIL HFA;VENTOLIN HFA) 108 (90 BASE) MCG/ACT inhaler, amoxicillin (AMOXIL) 875 MG tablet, benzonatate (TESSALON PERLES) 100 MG capsule, methylPREDNISolone acetate (DEPO-MEDROL) injection 80 mg  Acute bronchitis - Plan: predniSONE (DELTASONE) 10 MG tablet, albuterol (PROVENTIL HFA;VENTOLIN HFA) 108 (90 BASE) MCG/ACT inhaler, amoxicillin (AMOXIL) 875 MG tablet, benzonatate (TESSALON PERLES) 100 MG capsule, methylPREDNISolone acetate (DEPO-MEDROL) injection 80 mg  [name] FNP

## 2013-07-28 ENCOUNTER — Telehealth: Payer: Self-pay | Admitting: Family Medicine

## 2013-07-28 NOTE — Telephone Encounter (Signed)
Is he on oxygen?  How much?  He may need to come in for saturation on room air for documentation

## 2013-07-29 NOTE — Telephone Encounter (Signed)
Patient has an appointment made

## 2013-07-29 NOTE — Telephone Encounter (Signed)
He is on it just at night, they are not sure how much he is on.Marland KitchenMarland Kitchen

## 2013-07-29 NOTE — Telephone Encounter (Signed)
NTBS.

## 2013-08-03 ENCOUNTER — Ambulatory Visit: Payer: Self-pay | Admitting: Family Medicine

## 2013-08-10 ENCOUNTER — Other Ambulatory Visit: Payer: Self-pay | Admitting: *Deleted

## 2013-08-10 DIAGNOSIS — J449 Chronic obstructive pulmonary disease, unspecified: Secondary | ICD-10-CM

## 2013-08-10 NOTE — Progress Notes (Signed)
Per phone call from [place] placed for referral to pulmonary for COPD due to recurrent episodes of SOB

## 2013-08-24 ENCOUNTER — Telehealth: Payer: Self-pay | Admitting: Family Medicine

## 2013-08-24 NOTE — Telephone Encounter (Signed)
appt given for tomorrow with Bill 

## 2013-08-25 ENCOUNTER — Ambulatory Visit: Payer: Medicare Other | Admitting: Family Medicine

## 2013-08-25 ENCOUNTER — Telehealth: Payer: Self-pay | Admitting: Family Medicine

## 2013-08-30 ENCOUNTER — Telehealth: Payer: Self-pay | Admitting: Family Medicine

## 2013-08-30 NOTE — Telephone Encounter (Signed)
I called Roselyn Reef and he asked that we call patient. No answer at patients number

## 2013-09-12 NOTE — Telephone Encounter (Signed)
Multiple messages left- may call if still needed

## 2013-09-15 ENCOUNTER — Ambulatory Visit: Payer: Medicare Other | Admitting: Family Medicine

## 2013-09-24 ENCOUNTER — Other Ambulatory Visit: Payer: Self-pay | Admitting: General Practice

## 2013-09-26 ENCOUNTER — Telehealth: Payer: Self-pay | Admitting: Family Medicine

## 2013-09-27 ENCOUNTER — Other Ambulatory Visit: Payer: Self-pay | Admitting: Family Medicine

## 2013-09-27 NOTE — Telephone Encounter (Signed)
appt given for 7/16 with [place]

## 2013-09-27 NOTE — Telephone Encounter (Signed)
Last seen 07/26/13  B Oxford  If approved route to nurse to call in to [facility]

## 2013-09-28 ENCOUNTER — Telehealth: Payer: Self-pay | Admitting: Family Medicine

## 2013-09-28 NOTE — Telephone Encounter (Signed)
Called in.

## 2013-10-06 ENCOUNTER — Ambulatory Visit: Payer: Medicare Other | Admitting: Family Medicine

## 2013-10-29 ENCOUNTER — Other Ambulatory Visit: Payer: Self-pay | Admitting: Family Medicine

## 2013-10-31 NOTE — Telephone Encounter (Signed)
Patient last seen in office on 07-26-13. Rx last filled on 09-28-13 for #[phone]. Please advise. If approved please route to Pool A so nurse can phone in to pharmacy

## 2013-11-01 NOTE — Telephone Encounter (Signed)
rx called in

## 2013-11-01 NOTE — Telephone Encounter (Signed)
Please call in xanax with 1 refills 

## 2013-11-22 ENCOUNTER — Ambulatory Visit: Payer: Medicare Other | Admitting: Family Medicine

## 2013-11-29 ENCOUNTER — Ambulatory Visit (INDEPENDENT_AMBULATORY_CARE_PROVIDER_SITE_OTHER): Payer: Medicare Other | Admitting: Family

## 2013-11-29 ENCOUNTER — Encounter: Payer: Self-pay | Admitting: Family

## 2013-11-29 VITALS — BP 113/47 | HR 78 | Temp 97.0°F | Wt 131.0 lb

## 2013-11-29 DIAGNOSIS — R0602 Shortness of breath: Secondary | ICD-10-CM

## 2013-11-29 DIAGNOSIS — R059 Cough, unspecified: Secondary | ICD-10-CM

## 2013-11-29 DIAGNOSIS — I6350 Cerebral infarction due to unspecified occlusion or stenosis of unspecified cerebral artery: Secondary | ICD-10-CM

## 2013-11-29 DIAGNOSIS — I517 Cardiomegaly: Secondary | ICD-10-CM

## 2013-11-29 DIAGNOSIS — J441 Chronic obstructive pulmonary disease with (acute) exacerbation: Secondary | ICD-10-CM

## 2013-11-29 DIAGNOSIS — J209 Acute bronchitis, unspecified: Secondary | ICD-10-CM

## 2013-11-29 DIAGNOSIS — R05 Cough: Secondary | ICD-10-CM

## 2013-11-29 MED ORDER — ALBUTEROL SULFATE HFA 108 (90 BASE) MCG/ACT IN AERS: 2.0000 | INHALATION_SPRAY | Freq: Four times a day (QID) | RESPIRATORY_TRACT | Status: AC | PRN

## 2013-11-29 MED ORDER — BUDESONIDE-FORMOTEROL FUMARATE 160-4.5 MCG/ACT IN AERO: 2.0000 | INHALATION_SPRAY | Freq: Two times a day (BID) | RESPIRATORY_TRACT | Status: AC

## 2013-11-29 MED ORDER — PREDNISONE 10 MG PO TABS: ORAL_TABLET | ORAL | Status: DC

## 2013-11-29 MED ORDER — BENZONATATE 200 MG PO CAPS: 200.0000 mg | ORAL_CAPSULE | Freq: Three times a day (TID) | ORAL | Status: AC | PRN

## 2013-11-29 MED ORDER — AMOXICILLIN-POT CLAVULANATE 875-125 MG PO TABS: 1.0000 | ORAL_TABLET | Freq: Two times a day (BID) | ORAL | Status: DC

## 2013-11-29 NOTE — Patient Instructions (Signed)
Acute Bronchitis Bronchitis is inflammation of the airways that extend from the windpipe into the lungs (bronchi). The inflammation often causes mucus to develop. This leads to a cough, which is the most common symptom of bronchitis.  In acute bronchitis, the condition usually develops suddenly and goes away over time, usually in a couple weeks. Smoking, allergies, and asthma can make bronchitis worse. Repeated episodes of bronchitis may cause further lung problems.  CAUSES Acute bronchitis is most often caused by the same virus that causes a cold. The virus can spread from person to person (contagious) through coughing, sneezing, and touching contaminated objects. SIGNS AND SYMPTOMS   Cough.   Fever.   Coughing up mucus.   Body aches.   Chest congestion.   Chills.   Shortness of breath.   Sore throat.  DIAGNOSIS  Acute bronchitis is usually diagnosed through a physical exam. Your health care provider will also ask you questions about your medical history. Tests, such as chest X-rays, are sometimes done to rule out other conditions.  TREATMENT  Acute bronchitis usually goes away in a couple weeks. Oftentimes, no medical treatment is necessary. Medicines are sometimes given for relief of fever or cough. Antibiotic medicines are usually not needed but may be prescribed in certain situations. In some cases, an inhaler may be recommended to help reduce shortness of breath and control the cough. A cool mist vaporizer may also be used to help thin bronchial secretions and make it easier to clear the chest.  HOME CARE INSTRUCTIONS  Get plenty of rest.   Drink enough fluids to keep your urine clear or pale yellow (unless you have a medical condition that requires fluid restriction). Increasing fluids may help thin your respiratory secretions (sputum) and reduce chest congestion, and it will prevent dehydration.   Take medicines only as directed by your health care provider.  If  you were prescribed an antibiotic medicine, finish it all even if you start to feel better.  Avoid smoking and secondhand smoke. Exposure to cigarette smoke or irritating chemicals will make bronchitis worse. If you are a smoker, consider using nicotine gum or skin patches to help control withdrawal symptoms. Quitting smoking will help your lungs heal faster.   Reduce the chances of another bout of acute bronchitis by washing your hands frequently, avoiding people with cold symptoms, and trying not to touch your hands to your mouth, nose, or eyes.   Keep all follow-up visits as directed by your health care provider.  SEEK MEDICAL CARE IF: Your symptoms do not improve after 1 week of treatment.  SEEK IMMEDIATE MEDICAL CARE IF:  You develop an increased fever or chills.   You have chest pain.   You have severe shortness of breath.  You have bloody sputum.   You develop dehydration.  You faint or repeatedly feel like you are going to pass out.  You develop repeated vomiting.  You develop a severe headache. MAKE SURE YOU:   Understand these instructions.  Will watch your condition.  Will get help right away if you are not doing well or get worse. Document Released: 04/17/2004 Document Revised: 07/25/2013 Document Reviewed: 08/31/2012 [facility] Patient Information 2015 [place], [place]. This information is not intended to replace advice given to you by your health care provider. Make sure you discuss any questions you have with your health care provider.  - Take meds as prescribed - Use a cool mist humidifier  -Use saline nose sprays frequently -Saline irrigations of the nose can  be very helpful if done frequently.  * 4X daily for 1 week*  * Use of a nettie pot can be helpful with this. Follow directions with this* -Force fluids -For any cough or congestion  Use plain Mucinex- regular strength or max strength is fine   * Children- consult with Pharmacist for dosing -For  fever or aces or pains- take tylenol or ibuprofen appropriate for age and weight.  * for fevers greater than 101 orally you may alternate ibuprofen and tylenol every  3 hours.   Evelina Dun, FNP

## 2013-11-29 NOTE — Progress Notes (Signed)
Subjective:    Patient ID: [name], male    DOB: 12-03-1950, 63 y.o.   MRN: [id number]  Cough This is a chronic problem. The current episode started more than 1 year ago. The problem has been waxing and waning. The problem occurs every few minutes. The cough is productive of sputum. Associated symptoms include chills, ear pain, postnasal drip, rhinorrhea, shortness of breath and wheezing. Pertinent negatives include no fever, headaches, hemoptysis, nasal congestion or sore throat. The symptoms are aggravated by lying down. Risk factors for lung disease include smoking/tobacco exposure. He has tried nothing for the symptoms. The treatment provided no relief. His past medical history is significant for COPD. There is no history of asthma.      Review of Systems  Constitutional: Positive for chills. Negative for fever.  HENT: Positive for ear pain, postnasal drip and rhinorrhea. Negative for sore throat.   Eyes: Negative.   Respiratory: Positive for cough, shortness of breath and wheezing. Negative for hemoptysis.   Cardiovascular: Negative.   Gastrointestinal: Negative.   Endocrine: Negative.   Genitourinary: Negative.   Musculoskeletal: Negative.   Neurological: Negative.  Negative for headaches.  Hematological: Negative.   Psychiatric/Behavioral: Negative.   All other systems reviewed and are negative.      Objective:   Physical Exam  Vitals reviewed. Constitutional: He is oriented to person, place, and time. He appears well-developed and well-nourished. No distress.  HENT:  Head: Normocephalic.  Right Ear: External ear normal.  Left Ear: External ear normal.  Mouth/Throat: Oropharynx is clear and moist.  Nasal passage erythemas  Eyes: Pupils are equal, round, and reactive to light. Right eye exhibits no discharge. Left eye exhibits no discharge.  Neck: Normal range of motion. Neck supple. No thyromegaly present.  Cardiovascular: Normal rate, regular rhythm, normal  heart sounds and intact distal pulses.   No murmur heard. Pulmonary/Chest: Effort normal and breath sounds normal. No respiratory distress.  Coarse breath sounds in bilateral lungs   Abdominal: Soft. Bowel sounds are normal. He exhibits no distension. There is no tenderness.  Musculoskeletal: Normal range of motion. He exhibits no edema and no tenderness.  Neurological: He is alert and oriented to person, place, and time. He has normal reflexes. No cranial nerve deficit.  Skin: Skin is warm and dry. No rash noted. No erythema.  Psychiatric: He has a normal mood and affect. His behavior is normal. Judgment and thought content normal.      BP 113/47  Pulse 78  Temp(Src) 97 F (36.1 C) (Oral)  Wt 131 lb (59.421 kg)     Assessment & Plan:  1. Acute bronchitis, unspecified organism -- Take meds as prescribed - Use a cool mist humidifier  -Use saline nose sprays frequently -Saline irrigations of the nose can be very helpful if done frequently.  * 4X daily for 1 week*  * Use of a nettie pot can be helpful with this. Follow directions with this* -Force fluids -For any cough or congestion  Use plain Mucinex- regular strength or max strength is fine   * Children- consult with Pharmacist for dosing -For fever or aces or pains- take tylenol or ibuprofen appropriate for age and weight.  * for fevers greater than 101 orally you may alternate ibuprofen and tylenol every  3 hours.  albuterol (PROVENTIL HFA;VENTOLIN HFA) 108 (90 BASE) MCG/ACT inhaler; Inhale 2 puffs into the lungs every 6 (six) hours as needed for wheezing or shortness of breath.  Dispense: 1 Inhaler; Refill:  5 - predniSONE (DELTASONE) 10 MG tablet; Take 4 po qd x 2 days and then 3 po qd x 3 days then 2 po qd x 2 days and then 1 po qd x 2 days then stop  Dispense: 20 tablet; Refill: 0  2. SOB (shortness of breath) - albuterol (PROVENTIL HFA;VENTOLIN HFA) 108 (90 BASE) MCG/ACT inhaler; Inhale 2 puffs into the lungs every 6 (six)  hours as needed for wheezing or shortness of breath.  Dispense: 1 Inhaler; Refill: 5 - predniSONE (DELTASONE) 10 MG tablet; Take 4 po qd x 2 days and then 3 po qd x 3 days then 2 po qd x 2 days and then 1 po qd x 2 days then stop  Dispense: 20 tablet; Refill: 0 - Ambulatory referral to Cardiology  3. Cough - albuterol (PROVENTIL HFA;VENTOLIN HFA) 108 (90 BASE) MCG/ACT inhaler; Inhale 2 puffs into the lungs every 6 (six) hours as needed for wheezing or shortness of breath.  Dispense: 1 Inhaler; Refill: 5 - predniSONE (DELTASONE) 10 MG tablet; Take 4 po qd x 2 days and then 3 po qd x 3 days then 2 po qd x 2 days and then 1 po qd x 2 days then stop  Dispense: 20 tablet; Refill: 0  4. COPD exacerbation - albuterol (PROVENTIL HFA;VENTOLIN HFA) 108 (90 BASE) MCG/ACT inhaler; Inhale 2 puffs into the lungs every 6 (six) hours as needed for wheezing or shortness of breath.  Dispense: 1 Inhaler; Refill: 5 - predniSONE (DELTASONE) 10 MG tablet; Take 4 po qd x 2 days and then 3 po qd x 3 days then 2 po qd x 2 days and then 1 po qd x 2 days then stop  Dispense: 20 tablet; Refill: 0  5. Cardiomegaly - Ambulatory referral to Cardiology   Evelina Dun, FNP

## 2013-12-01 ENCOUNTER — Ambulatory Visit (INDEPENDENT_AMBULATORY_CARE_PROVIDER_SITE_OTHER): Payer: Medicare Other | Admitting: Cardiovascular Disease

## 2013-12-01 ENCOUNTER — Encounter: Payer: Self-pay | Admitting: Cardiovascular Disease

## 2013-12-01 ENCOUNTER — Other Ambulatory Visit: Payer: Self-pay | Admitting: Family Medicine

## 2013-12-01 VITALS — BP 136/66 | HR 79 | Ht 66.0 in | Wt 129.0 lb

## 2013-12-01 DIAGNOSIS — I4891 Unspecified atrial fibrillation: Secondary | ICD-10-CM

## 2013-12-01 DIAGNOSIS — Z992 Dependence on renal dialysis: Secondary | ICD-10-CM

## 2013-12-01 DIAGNOSIS — I2510 Atherosclerotic heart disease of native coronary artery without angina pectoris: Secondary | ICD-10-CM

## 2013-12-01 DIAGNOSIS — J438 Other emphysema: Secondary | ICD-10-CM

## 2013-12-01 DIAGNOSIS — N186 End stage renal disease: Secondary | ICD-10-CM

## 2013-12-01 DIAGNOSIS — I6350 Cerebral infarction due to unspecified occlusion or stenosis of unspecified cerebral artery: Secondary | ICD-10-CM

## 2013-12-01 DIAGNOSIS — I5042 Chronic combined systolic (congestive) and diastolic (congestive) heart failure: Secondary | ICD-10-CM

## 2013-12-01 DIAGNOSIS — I509 Heart failure, unspecified: Secondary | ICD-10-CM

## 2013-12-01 DIAGNOSIS — Z9289 Personal history of other medical treatment: Secondary | ICD-10-CM

## 2013-12-01 DIAGNOSIS — I739 Peripheral vascular disease, unspecified: Secondary | ICD-10-CM

## 2013-12-01 DIAGNOSIS — Z87898 Personal history of other specified conditions: Secondary | ICD-10-CM

## 2013-12-01 DIAGNOSIS — D649 Anemia, unspecified: Secondary | ICD-10-CM

## 2013-12-01 DIAGNOSIS — I10 Essential (primary) hypertension: Secondary | ICD-10-CM

## 2013-12-01 DIAGNOSIS — I480 Paroxysmal atrial fibrillation: Secondary | ICD-10-CM

## 2013-12-01 DIAGNOSIS — E785 Hyperlipidemia, unspecified: Secondary | ICD-10-CM

## 2013-12-01 MED ORDER — ATORVASTATIN CALCIUM 40 MG PO TABS: 40.0000 mg | ORAL_TABLET | Freq: Every day | ORAL | Status: DC

## 2013-12-01 MED ORDER — ASPIRIN EC 81 MG PO TBEC: 81.0000 mg | DELAYED_RELEASE_TABLET | Freq: Every day | ORAL | Status: AC

## 2013-12-01 NOTE — Patient Instructions (Signed)
   Start Aspirin 81mg  daily.  Start Lipitor 40mg  daily. Continue all other medications.   Labs for CBC to be done in 2 weeks Labs for Lipids & LFT to be done in 2 months Office will contact with results via phone or letter.   Your physician wants you to follow up in:  3 months.  You will receive a reminder letter in the mail one-two months in advance.  If you don't receive a letter, please call our office to schedule the follow up appointment.

## 2013-12-01 NOTE — Progress Notes (Signed)
Patient ID: [name], male   DOB: 12-Nov-1950, 63 y.o.   MRN: [id number]      SUBJECTIVE: The patient is a 63 year old male with a past medical history significant for coronary artery disease, paroxysmal atrial fibrillation/flutter, chronic diastolic heart failure, hypertension, peripheral vascular disease, CVA, COPD and hyperlipidemia. Most recent echocardiogram on 04/04/2013 demonstrated mildly reduced left into a systolic function, EF 27-25% with mild LVH, grade 1 diastolic dysfunction and mild biatrial enlargement. 7 day event monitor in January 2015 demonstrated normal sinus rhythm with no evidence of atrial fibrillation or flutter.  He is currently being treated for acute bronchitis and is taking Augmentin. He describes hematuria but denies a history of GI bleeding. He is not on aspirin. Upon an extensive review of previous hospitalizations, it appears he had been on aspirin and Lipitor in June 2014. It also appears hydralazine and Imdur were discontinued in March 2015 as his blood pressure was noted to drop repeatedly with hemodialysis, which occurs on Mondays, Wednesdays and Fridays. He denies chest pain. He has some dyspnea with a dry nonproductive cough. He denies leg swelling. He also denies palpitations.  His primary complaints relate to arthritic pain of his hands and neuropathic pain in his legs and feet.  Review of Systems: As per "subjective", otherwise negative.  Allergies  Allergen Reactions  . Ambien [Zolpidem Tartrate] Other (See Comments)    Hallucinations    Current Outpatient Prescriptions  Medication Sig Dispense Refill  . albuterol (PROVENTIL HFA;VENTOLIN HFA) 108 (90 BASE) MCG/ACT inhaler Inhale 2 puffs into the lungs every 6 (six) hours as needed for wheezing or shortness of breath.  1 Inhaler  5  . albuterol (PROVENTIL) (2.5 MG/3ML) 0.083% nebulizer solution Take 2.5 mg by nebulization every 6 (six) hours as needed for wheezing or shortness of breath.      .  ALPRAZolam (XANAX) 1 MG tablet TAKE 1 TABLET TWICE A DAY AS NEEDED FOR ANXIETY  60 tablet  0  . amoxicillin-clavulanate (AUGMENTIN) 875-125 MG per tablet Take 1 tablet by mouth 2 (two) times daily.  20 tablet  0  . benzonatate (TESSALON) 200 MG capsule Take 1 capsule (200 mg total) by mouth 3 (three) times daily as needed for cough.  30 capsule  2  . budesonide-formoterol (SYMBICORT) 160-4.5 MCG/ACT inhaler Inhale 2 puffs into the lungs 2 (two) times daily.  1 Inhaler  3  . calcium acetate (PHOSLO) 667 MG capsule Take 1,334 mg by mouth 3 (three) times daily with meals.       . cinacalcet (SENSIPAR) 30 MG tablet Take 30 mg by mouth daily with breakfast.      . DULoxetine (CYMBALTA) 60 MG capsule Take 60 mg by mouth daily.       . folic acid (FOLVITE) 1 MG tablet Take 1 mg by mouth daily.      Marland Kitchen gabapentin (NEURONTIN) 300 MG capsule Take 300 mg by mouth 3 (three) times daily.      . multivitamin (RENA-VIT) TABS tablet Take 1 tablet by mouth daily.      Marland Kitchen omeprazole (PRILOSEC) 20 MG capsule Take 20 mg by mouth daily.      . predniSONE (DELTASONE) 10 MG tablet Take 4 po qd x 2 days and then 3 po qd x 3 days then 2 po qd x 2 days and then 1 po qd x 2 days then stop  20 tablet  0  . traMADol (ULTRAM) 50 MG tablet Take 1 tablet by mouth every 6 (  six) hours as needed.       No current facility-administered medications for this visit.    Past Medical History  Diagnosis Date  . Hyperlipidemia   . Arthritis   . Claudication   . Anemia   . Chronic diastolic CHF (congestive heart failure) 07/2010    a. 07/2008 Echo EF 50-55%, mild-mod LVH, Gr 1 DD.  Marland Kitchen Peripheral vascular occlusive disease     a. 07/2011 Periph Angio: No signif Ao-illiac dzs, LCF stenosis, 100% LSFA w recon above knee pop and 3 vessel runoff, 100% RSFA w/ recon above knee --> pending L Fem to below Knee pop bypass.  Marland Kitchen History of tobacco abuse   . Hypertension     takes Amlodipine,Metoprolol,and Prinivil daily  . Dyspnea on exertion      with exertion  . Cataract     bilateral  . Dialysis patient   . PUD (peptic ulcer disease) mid 1990's    with GI bleed.  endoscoped in mid 1990's at [facility]  . Stroke 2005    no residual  . Asthma     08/2012  . GERD (gastroesophageal reflux disease)   . History of blood transfusion     peptic ulcer  . ESRD (end stage renal disease)     a. MWF dialysis in [place] (followed by Dr. Lowanda Foster)  . COPD (chronic obstructive pulmonary disease) 04/01/2013    Past Surgical History  Procedure Laterality Date  . Arteriovenous graft placement    . Av fistula placement  12/10/2000    Right brachiocephalic arteriovenous   . Aortagram  07/29/2011    Abdominal Aortagram  . Cardiac catheterization  08/01/11    Left heart catheterization  . Femoral-popliteal bypass graft  09/09/2011    Procedure: BYPASS GRAFT FEMORAL-POPLITEAL ARTERY;  Surgeon: Serafina Mitchell, MD;  Location: [place];  Service: Vascular;  Laterality: Left;  . Amputation  09/12/2011    Procedure: AMPUTATION BELOW KNEE;  Surgeon: Serafina Mitchell, MD;  Location: [facility] OR;  Service: Vascular;  Laterality: Left;  TRANSMETATARSAL  . Application of wound vac  10/30/2011    Procedure: APPLICATION OF WOUND VAC;  Surgeon: Serafina Mitchell, MD;  Location: [place];  Service: Vascular;  Laterality: Left;  . Groin debridement  11/01/2011    Procedure: Virl Son DEBRIDEMENT;  Surgeon: Angelia Mould, MD;  Location: [facility] OR;  Service: Vascular;  Laterality: Left;  . Esophagogastroduodenoscopy  11/06/2011    Procedure: ESOPHAGOGASTRODUODENOSCOPY (EGD);  Surgeon: Jerene Bears, MD;  Location: [place];  Service: Gastroenterology;  Laterality: N/A;  . Femoral-popliteal bypass graft  01/02/2012    Procedure: BYPASS GRAFT FEMORAL-POPLITEAL ARTERY;  Surgeon: Serafina Mitchell, MD;  Location: [place];  Service: Vascular;  Laterality: Right;  . Amputation  01/02/2012    Procedure: AMPUTATION RAY;  Surgeon: Serafina Mitchell, MD;  Location: [facility] OR;  Service: Vascular;   Laterality: Right;  . Toe amputation Right     5/13- great toe  . Exchange of a dialysis catheter N/A 07/15/2012    Procedure: EXCHANGE OF A DIALYSIS CATHETER;  Surgeon: Conrad Birchwood, MD;  Location: [place];  Service: Vascular;  Laterality: N/A;  . Av fistula placement Left 07/15/2012    Procedure: ARTERIOVENOUS (AV) FISTULA CREATION;  Surgeon: Conrad Ivanhoe, MD;  Location: [place];  Service: Vascular;  Laterality: Left;  . Av fistula placement Left 09/07/2012    Procedure: INSERTION OF ARTERIOVENOUS (AV) GORE-TEX GRAFT ARM;  Surgeon: Conrad Dellwood, MD;  [facility]: [facility] OR;  Service: Vascular;  Laterality: Left;  . Insertion of dialysis catheter Right 09/07/2012    Procedure: INSERTION OF a Femoral DIALYSIS CATHETER;  Surgeon: Conrad [place], MD;  Location: [place];  Service: Vascular;  Laterality: Right;  . Lipoma excision Left 09/07/2012    Procedure: EXCISION Seroma left arm;  Surgeon: Conrad , MD;  Location: [place];  Service: Vascular;  Laterality: Left;  . Hernia repair      umbilical  . Thrombectomy and revision of arterioventous (av) goretex  graft Left 11/30/2012    Procedure: THROMBECTOMY AND REVISION OF ARTERIOVENTOUS (AV) GORETEX  GRAFT;  Surgeon: Angelia Mould, MD;  Location: [place];  Service: Vascular;  Laterality: Left;    History   Social History  . Marital Status: Legally Separated    Spouse Name: N/A    Number of Children: N/A  . Years of Education: N/A   Occupational History  . DISABLED    Social History Main Topics  . Smoking status: Current Every Day Smoker -- 0.50 packs/day for 40 years    Types: Cigarettes    Start date: 12/20/1964  . Smokeless tobacco: Never Used     Comment: quit, but still "sneaks a smoke" occasionally  . Alcohol Use: No  . Drug Use: No  . Sexual Activity: Not Currently   Other Topics Concern  . Not on file   Social History Narrative   Lives in [place].  His cousin helps him out and drives him to appts. He quit smoking earlier this year.      Filed Vitals:   12/01/13 0942  BP: 136/66  Pulse: 79  Height: 5\' 6"  (1.676 m)  Weight: 129 lb (58.514 kg)    PHYSICAL EXAM General: NAD HEENT: Normal. Neck: No JVD, no thyromegaly. Lungs: Diminished b/l, faint inspiratory wheezes which are intermittent, no crackles. CV: Nondisplaced PMI.  Regular rate and rhythm, normal S1/S2, no S3/S4, II-III/VI pansystolic murmur along left sternal border. No pretibial or periankle edema.  No carotid bruit.  Diminished pedal pulses.  Abdomen: Soft, nontender, no hepatosplenomegaly, no distention.  Neurologic: Alert and oriented x 3.  Psych: Normal affect. Skin: Normal. Musculoskeletal: Normal range of motion, no gross deformities. Extremities: No clubbing or cyanosis.   ECG: Most recent ECG reviewed.  07/2011 Cath  Hemodynamics:  AO 162/64  LV 169/21  Coronary angiography:  Coronary dominance: Right  Left mainstem: Heavily calcified. Distal 50%.  Left anterior descending (LAD): Heavy calcification. Ostial 50%. Large vessel wrapping the apex. D1 moderate and normal.  Left circumflex (LCx): Large vessel. AV groove normal. OM1 large and luminal irregularities. OM2 moderate and normal. PL large and branching and normal.  Right coronary artery (RCA): Dominant. Long subtotal chronic occlusion with TIMI 1 flow. Distal vessel with scant left to right collaterals.  Left ventriculography: Left ventricular systolic function is decreased, LVEF is estimated at 50 with moderate inferior akinesis, there is no significant mitral regurgitation  Final Conclusions: Single vessel obstructive CAD. Moderate nonobstructive plaque elsewhere. Mild LV dysfunction.  Recommendations: Medical management and risk factor modification. The patient will be at acceptable risk for the planned vascular surgery according to ACC/AHA guidelines.        ASSESSMENT AND PLAN: 1. Chronic systolic and diastolic heart failure: Euvolemic and stable. Volume will be managed with  hemodialysis. Reportedly not a candidate for antihypertensives due to precipitous BP drops with hemodialysis. 2. Paroxysmal atrial fibrillation: No recurrence demonstrated. Event monitor demonstrated normal sinus rhythm. Not  on anticoagulation at present. No palpitations reported. 3. CAD: Stable ischemic heart disease, EF 45-50%. I will start ASA 81 mg and Lipitor 40 mg and I am unclear as to why he is not already on these medications. Check CBC in 2 weeks and lipids/LFT's in 2 months. 4. Essential HTN: Presently controlled. 5. COPD: Stable. 6. PVD: No claudication pain. Followed by vascular surgery. 7. Hyperlipidemia: Will start Lipitor 40 mg and check labs as noted above. 8. ESRD: Dialysis Mon, Wed, Fri. Follows with Dr. Lowanda Foster.  Dispo: f/u 3 months.  Time spent: 40 minutes, of which >50% was spent on hospital record review, counseling for CAD and heart failure and relevant treatment strategies.  Kate Sable, M.D., F.A.C.C.

## 2013-12-02 NOTE — Telephone Encounter (Signed)
[facility],vm.

## 2013-12-02 NOTE — Telephone Encounter (Signed)
Last filled 11/01/13, last seen 11/29/13. Route to pool. [facility]

## 2013-12-08 ENCOUNTER — Telehealth: Payer: Self-pay | Admitting: Family Medicine

## 2013-12-08 NOTE — Telephone Encounter (Signed)
Done today by Zadie Rhine

## 2013-12-13 ENCOUNTER — Emergency Department (HOSPITAL_COMMUNITY): Payer: Medicare Other

## 2013-12-13 ENCOUNTER — Inpatient Hospital Stay (HOSPITAL_COMMUNITY)
Admission: EM | Admit: 2013-12-13 | Discharge: 2013-12-15 | DRG: 190 | Disposition: A | Discharge status: Hospice | Payer: Medicare Other | Attending: Internal Medicine | Admitting: Internal Medicine

## 2013-12-13 DIAGNOSIS — H269 Unspecified cataract: Secondary | ICD-10-CM | POA: Diagnosis present

## 2013-12-13 DIAGNOSIS — I739 Peripheral vascular disease, unspecified: Secondary | ICD-10-CM | POA: Diagnosis present

## 2013-12-13 DIAGNOSIS — E785 Hyperlipidemia, unspecified: Secondary | ICD-10-CM | POA: Diagnosis present

## 2013-12-13 DIAGNOSIS — J9600 Acute respiratory failure, unspecified whether with hypoxia or hypercapnia: Secondary | ICD-10-CM | POA: Diagnosis present

## 2013-12-13 DIAGNOSIS — Z8673 Personal history of transient ischemic attack (TIA), and cerebral infarction without residual deficits: Secondary | ICD-10-CM | POA: Diagnosis not present

## 2013-12-13 DIAGNOSIS — Z992 Dependence on renal dialysis: Secondary | ICD-10-CM

## 2013-12-13 DIAGNOSIS — J441 Chronic obstructive pulmonary disease with (acute) exacerbation: Secondary | ICD-10-CM | POA: Diagnosis present

## 2013-12-13 DIAGNOSIS — J9622 Acute and chronic respiratory failure with hypercapnia: Secondary | ICD-10-CM

## 2013-12-13 DIAGNOSIS — I5042 Chronic combined systolic (congestive) and diastolic (congestive) heart failure: Secondary | ICD-10-CM | POA: Diagnosis present

## 2013-12-13 DIAGNOSIS — I4892 Unspecified atrial flutter: Secondary | ICD-10-CM | POA: Diagnosis present

## 2013-12-13 DIAGNOSIS — I4891 Unspecified atrial fibrillation: Secondary | ICD-10-CM | POA: Diagnosis present

## 2013-12-13 DIAGNOSIS — N186 End stage renal disease: Secondary | ICD-10-CM | POA: Diagnosis present

## 2013-12-13 DIAGNOSIS — Z515 Encounter for palliative care: Secondary | ICD-10-CM

## 2013-12-13 DIAGNOSIS — I509 Heart failure, unspecified: Secondary | ICD-10-CM | POA: Diagnosis present

## 2013-12-13 DIAGNOSIS — Z66 Do not resuscitate: Secondary | ICD-10-CM | POA: Diagnosis present

## 2013-12-13 DIAGNOSIS — Z79899 Other long term (current) drug therapy: Secondary | ICD-10-CM | POA: Diagnosis not present

## 2013-12-13 DIAGNOSIS — I959 Hypotension, unspecified: Secondary | ICD-10-CM | POA: Diagnosis present

## 2013-12-13 DIAGNOSIS — S88119A Complete traumatic amputation at level between knee and ankle, unspecified lower leg, initial encounter: Secondary | ICD-10-CM | POA: Diagnosis not present

## 2013-12-13 DIAGNOSIS — F17200 Nicotine dependence, unspecified, uncomplicated: Secondary | ICD-10-CM | POA: Diagnosis present

## 2013-12-13 DIAGNOSIS — M129 Arthropathy, unspecified: Secondary | ICD-10-CM | POA: Diagnosis present

## 2013-12-13 DIAGNOSIS — K219 Gastro-esophageal reflux disease without esophagitis: Secondary | ICD-10-CM | POA: Diagnosis present

## 2013-12-13 DIAGNOSIS — R4182 Altered mental status, unspecified: Secondary | ICD-10-CM | POA: Diagnosis present

## 2013-12-13 DIAGNOSIS — J9620 Acute and chronic respiratory failure, unspecified whether with hypoxia or hypercapnia: Secondary | ICD-10-CM

## 2013-12-13 DIAGNOSIS — G9340 Encephalopathy, unspecified: Secondary | ICD-10-CM

## 2013-12-13 DIAGNOSIS — Z7982 Long term (current) use of aspirin: Secondary | ICD-10-CM | POA: Diagnosis not present

## 2013-12-13 DIAGNOSIS — Z87891 Personal history of nicotine dependence: Secondary | ICD-10-CM

## 2013-12-13 DIAGNOSIS — E162 Hypoglycemia, unspecified: Secondary | ICD-10-CM | POA: Diagnosis present

## 2013-12-13 DIAGNOSIS — R7989 Other specified abnormal findings of blood chemistry: Secondary | ICD-10-CM

## 2013-12-13 DIAGNOSIS — G9341 Metabolic encephalopathy: Secondary | ICD-10-CM | POA: Diagnosis present

## 2013-12-13 DIAGNOSIS — R590 Localized enlarged lymph nodes: Secondary | ICD-10-CM | POA: Diagnosis present

## 2013-12-13 DIAGNOSIS — R778 Other specified abnormalities of plasma proteins: Secondary | ICD-10-CM

## 2013-12-13 LAB — I-STAT VENOUS BLOOD GAS, ED
Acid-base deficit: 1 mmol/L (ref 0.0–2.0)
BICARBONATE: 29.8 meq/L — AB (ref 20.0–24.0)
O2 Saturation: 44 %
PCO2 VEN: 77.8 mmHg — AB (ref 45.0–50.0)
PH VEN: 7.19 — AB (ref 7.250–7.300)
PO2 VEN: 31 mmHg (ref 30.0–45.0)
Patient temperature: 98.6
TCO2: 32 mmol/L (ref 0–100)

## 2013-12-13 LAB — CBC WITH DIFFERENTIAL/PLATELET
Basophils Absolute: 0 10*3/uL (ref 0.0–0.1)
Basophils Relative: 0 % (ref 0–1)
EOS ABS: 0.1 10*3/uL (ref 0.0–0.7)
EOS PCT: 2 % (ref 0–5)
HEMATOCRIT: 36.8 % — AB (ref 39.0–52.0)
Hemoglobin: 11 g/dL — ABNORMAL LOW (ref 13.0–17.0)
LYMPHS ABS: 1 10*3/uL (ref 0.7–4.0)
LYMPHS PCT: 18 % (ref 12–46)
MCH: 27.8 pg (ref 26.0–34.0)
MCHC: 29.9 g/dL — ABNORMAL LOW (ref 30.0–36.0)
MCV: 93.2 fL (ref 78.0–100.0)
MONO ABS: 0.7 10*3/uL (ref 0.1–1.0)
Monocytes Relative: 13 % — ABNORMAL HIGH (ref 3–12)
Neutro Abs: 3.8 10*3/uL (ref 1.7–7.7)
Neutrophils Relative %: 67 % (ref 43–77)
Platelets: 167 10*3/uL (ref 150–400)
RBC: 3.95 MIL/uL — AB (ref 4.22–5.81)
RDW: 19.5 % — ABNORMAL HIGH (ref 11.5–15.5)
WBC: 5.6 10*3/uL (ref 4.0–10.5)

## 2013-12-13 LAB — CBG MONITORING, ED
GLUCOSE-CAPILLARY: 47 mg/dL — AB (ref 70–99)
Glucose-Capillary: 35 mg/dL — CL (ref 70–99)
Glucose-Capillary: 314 mg/dL — ABNORMAL HIGH (ref 70–99)
Glucose-Capillary: 191 mg/dL — ABNORMAL HIGH (ref 70–99)

## 2013-12-13 LAB — COMPREHENSIVE METABOLIC PANEL
ALT: 9 U/L (ref 0–53)
ANION GAP: 16 — AB (ref 5–15)
AST: 35 U/L (ref 0–37)
Albumin: 2.5 g/dL — ABNORMAL LOW (ref 3.5–5.2)
Alkaline Phosphatase: 82 U/L (ref 39–117)
BUN: 35 mg/dL — AB (ref 6–23)
CALCIUM: 8.5 mg/dL (ref 8.4–10.5)
CO2: 25 meq/L (ref 19–32)
CREATININE: 5.73 mg/dL — AB (ref 0.50–1.35)
Chloride: 97 mEq/L (ref 96–112)
GFR, EST AFRICAN AMERICAN: 11 mL/min — AB (ref >90)
GFR, EST NON AFRICAN AMERICAN: 9 mL/min — AB (ref >90)
GLUCOSE: 219 mg/dL — AB (ref 70–99)
Potassium: 3.7 mEq/L (ref 3.7–5.3)
SODIUM: 138 meq/L (ref 137–147)
TOTAL PROTEIN: 6.6 g/dL (ref 6.0–8.3)
Total Bilirubin: 0.4 mg/dL (ref 0.3–1.2)

## 2013-12-13 LAB — GLUCOSE, CAPILLARY
GLUCOSE-CAPILLARY: 90 mg/dL (ref 70–99)
GLUCOSE-CAPILLARY: 96 mg/dL (ref 70–99)
GLUCOSE-CAPILLARY: 131 mg/dL — AB (ref 70–99)
Glucose-Capillary: 45 mg/dL — ABNORMAL LOW (ref 70–99)

## 2013-12-13 LAB — LACTIC ACID, PLASMA: Lactic Acid, Venous: 0.2 mmol/L — ABNORMAL LOW (ref 0.5–2.2)

## 2013-12-13 LAB — I-STAT CHEM 8, ED
BUN: 37 mg/dL — AB (ref 6–23)
CALCIUM ION: 1.06 mmol/L — AB (ref 1.13–1.30)
Chloride: 100 mEq/L (ref 96–112)
Creatinine, Ser: 5.8 mg/dL — ABNORMAL HIGH (ref 0.50–1.35)
GLUCOSE: 227 mg/dL — AB (ref 70–99)
HEMATOCRIT: 41 % (ref 39.0–52.0)
Hemoglobin: 13.9 g/dL (ref 13.0–17.0)
Potassium: 3.5 mEq/L — ABNORMAL LOW (ref 3.7–5.3)
Sodium: 137 mEq/L (ref 137–147)
TCO2: 27 mmol/L (ref 0–100)

## 2013-12-13 LAB — I-STAT TROPONIN, ED: TROPONIN I, POC: 0.64 ng/mL — AB (ref 0.00–0.08)

## 2013-12-13 LAB — I-STAT ARTERIAL BLOOD GAS, ED
Bicarbonate: 28.5 mEq/L — ABNORMAL HIGH (ref 20.0–24.0)
O2 SAT: 94 %
TCO2: 31 mmol/L (ref 0–100)
pCO2 arterial: 66.2 mmHg (ref 35.0–45.0)
pH, Arterial: 7.243 — ABNORMAL LOW (ref 7.350–7.450)
pO2, Arterial: 84 mmHg (ref 80.0–100.0)

## 2013-12-13 LAB — I-STAT CG4 LACTIC ACID, ED: LACTIC ACID, VENOUS: 1.15 mmol/L (ref 0.5–2.2)

## 2013-12-13 LAB — AMMONIA: Ammonia: 19 umol/L (ref 11–60)

## 2013-12-13 LAB — TROPONIN I: Troponin I: 3.58 ng/mL (ref <0.30)

## 2013-12-13 MED ORDER — DEXTROSE 50 % IV SOLN
INTRAVENOUS | Status: AC
  Administered 2013-12-13: 50 mL via INTRAVENOUS
  Filled 2013-12-13: qty 50

## 2013-12-13 MED ORDER — ALBUTEROL SULFATE (2.5 MG/3ML) 0.083% IN NEBU
2.5000 mg | INHALATION_SOLUTION | Freq: Four times a day (QID) | RESPIRATORY_TRACT | Status: DC
  Administered 2013-12-13: 2.5 mg via RESPIRATORY_TRACT
  Filled 2013-12-13: qty 3

## 2013-12-13 MED ORDER — PIPERACILLIN-TAZOBACTAM 3.375 G IVPB
3.3750 g | Freq: Once | INTRAVENOUS | Status: DC
  Administered 2013-12-13: 3.375 g via INTRAVENOUS
  Filled 2013-12-13: qty 50

## 2013-12-13 MED ORDER — ALBUTEROL SULFATE (2.5 MG/3ML) 0.083% IN NEBU
5.0000 mg | INHALATION_SOLUTION | Freq: Once | RESPIRATORY_TRACT | Status: AC
  Administered 2013-12-13: 5 mg via RESPIRATORY_TRACT
  Filled 2013-12-13: qty 6

## 2013-12-13 MED ORDER — ONDANSETRON HCL 4 MG/2ML IJ SOLN: 4.0000 mg | Freq: Four times a day (QID) | INTRAMUSCULAR | Status: DC | PRN

## 2013-12-13 MED ORDER — ACETAMINOPHEN 650 MG RE SUPP: 650.0000 mg | Freq: Four times a day (QID) | RECTAL | Status: DC | PRN

## 2013-12-13 MED ORDER — ALBUTEROL SULFATE (2.5 MG/3ML) 0.083% IN NEBU: 2.5000 mg | INHALATION_SOLUTION | RESPIRATORY_TRACT | Status: DC | PRN

## 2013-12-13 MED ORDER — VANCOMYCIN HCL IN DEXTROSE 1-5 GM/200ML-% IV SOLN
1000.0000 mg | Freq: Once | INTRAVENOUS | Status: AC
  Administered 2013-12-13: 1000 mg via INTRAVENOUS
  Filled 2013-12-13: qty 200

## 2013-12-13 MED ORDER — DEXTROSE 50 % IV SOLN
INTRAVENOUS | Status: AC
  Administered 2013-12-13: 50 mL
  Filled 2013-12-13: qty 50

## 2013-12-13 MED ORDER — ACETAMINOPHEN 325 MG PO TABS: 650.0000 mg | ORAL_TABLET | Freq: Four times a day (QID) | ORAL | Status: DC | PRN

## 2013-12-13 MED ORDER — METHYLPREDNISOLONE SODIUM SUCC 125 MG IJ SOLR
125.0000 mg | Freq: Once | INTRAMUSCULAR | Status: AC
  Administered 2013-12-13: 125 mg via INTRAVENOUS
  Filled 2013-12-13: qty 2

## 2013-12-13 MED ORDER — SODIUM CHLORIDE 0.9 % IJ SOLN
3.0000 mL | Freq: Two times a day (BID) | INTRAMUSCULAR | Status: DC
  Administered 2013-12-14 (×2): 3 mL via INTRAVENOUS

## 2013-12-13 MED ORDER — ONDANSETRON HCL 4 MG PO TABS: 4.0000 mg | ORAL_TABLET | Freq: Four times a day (QID) | ORAL | Status: DC | PRN

## 2013-12-13 MED ORDER — DEXTROSE 50 % IV SOLN
1.0000 | Freq: Once | INTRAVENOUS | Status: AC
  Administered 2013-12-13: 50 mL via INTRAVENOUS

## 2013-12-13 MED ORDER — HEPARIN SODIUM (PORCINE) 5000 UNIT/ML IJ SOLN
5000.0000 [IU] | Freq: Three times a day (TID) | INTRAMUSCULAR | Status: DC
  Administered 2013-12-13: 5000 [IU] via SUBCUTANEOUS
  Filled 2013-12-13: qty 1

## 2013-12-13 MED ORDER — METHYLPREDNISOLONE SODIUM SUCC 125 MG IJ SOLR
60.0000 mg | Freq: Four times a day (QID) | INTRAMUSCULAR | Status: DC
  Administered 2013-12-13 – 2013-12-14 (×2): 60 mg via INTRAVENOUS
  Filled 2013-12-13 (×3): qty 0.96
  Filled 2013-12-13: qty 2

## 2013-12-13 MED ORDER — MORPHINE SULFATE 2 MG/ML IJ SOLN
1.0000 mg | INTRAMUSCULAR | Status: DC | PRN
  Administered 2013-12-13: 1 mg via INTRAVENOUS
  Filled 2013-12-13: qty 1

## 2013-12-13 NOTE — H&P (Addendum)
[facility] History and Physical  [name] QIW:[id number] DOB: April 11, 1950 DOA: 12/13/2013  Referring physician: er PCP: Redge Gainer, MD   Chief Complaint: AMS- found down  HPI: [name] is a 63 y.o. male - history from EMS and ER physican With multiple medical issues.  He was brought in by EMS.  He was found to be altered and weak per EMS report.  His Blood sugar was 31- given d50 and improved to 91.  He then dropped back to 23 and 2nd amp was given.  He then dropped to 35 and d50 was given again  In the ER, he was found to be wheezing.  Patient was c/o pain all over and decreased appetite.  Patient continues to smoke.  He and his brother expressed to the ER that he does not wish to have CPR or be intubated.   He was found to have an low pH, elevated PCO2 and placed on bipap.  His blood sugars stabilized in the ER with out requiring a drip.  He was given abx and steroids.  His chest x ray showed fluid overload.  His troponin was mildly elevated as well.    Review of Systems:  Unable to do ROS as patient is on BIpap   Past Medical History  Diagnosis Date  . Hyperlipidemia   . Arthritis   . Claudication   . Anemia   . Chronic diastolic CHF (congestive heart failure) 07/2010    a. 07/2008 Echo EF 50-55%, mild-mod LVH, Gr 1 DD.  Marland Kitchen Peripheral vascular occlusive disease     a. 07/2011 Periph Angio: No signif Ao-illiac dzs, LCF stenosis, 100% LSFA w recon above knee pop and 3 vessel runoff, 100% RSFA w/ recon above knee --> pending L Fem to below Knee pop bypass.  Marland Kitchen History of tobacco abuse   . Hypertension     takes Amlodipine,Metoprolol,and Prinivil daily  . Dyspnea on exertion     with exertion  . Cataract     bilateral  . Dialysis patient   . PUD (peptic ulcer disease) mid 1990's    with GI bleed.  [facility]oscoped in mid 1990's at [facility]  . Stroke 2005    no residual  . Asthma     08/2012  . GERD (gastroesophageal reflux disease)   . History of  blood transfusion     peptic ulcer  . ESRD (end stage renal disease)     a. MWF dialysis in [place] (followed by Dr. Lowanda Foster)  . COPD (chronic obstructive pulmonary disease) 04/01/2013   Past Surgical History  Procedure Laterality Date  . Arteriovenous graft placement    . Av fistula placement  12/10/2000    Right brachiocephalic arteriovenous   . Aortagram  07/29/2011    Abdominal Aortagram  . Cardiac catheterization  08/01/11    Left heart catheterization  . Femoral-popliteal bypass graft  09/09/2011    Procedure: BYPASS GRAFT FEMORAL-POPLITEAL ARTERY;  Surgeon: Serafina Mitchell, MD;  Location: [place];  Service: Vascular;  Laterality: Left;  . Amputation  09/12/2011    Procedure: AMPUTATION BELOW KNEE;  Surgeon: Serafina Mitchell, MD;  [facility];  Service: Vascular;  Laterality: Left;  TRANSMETATARSAL  . Application of wound vac  10/30/2011    Procedure: APPLICATION OF WOUND VAC;  Surgeon: Serafina Mitchell, MD;  Location: [place];  Service: Vascular;  Laterality: Left;  . Groin debridement  11/01/2011    Procedure: Virl Son DEBRIDEMENT;  Surgeon: Angelia Mould,  MD;  Location: [facility];  Service: Vascular;  Laterality: Left;  . Esophagogastroduodenoscopy  11/06/2011    Procedure: ESOPHAGOGASTRODUODENOSCOPY (EGD);  Surgeon: Jerene Bears, MD;  Location: [place];  Service: Gastroenterology;  Laterality: N/A;  . Femoral-popliteal bypass graft  01/02/2012    Procedure: BYPASS GRAFT FEMORAL-POPLITEAL ARTERY;  Surgeon: Serafina Mitchell, MD;  Location: [place];  Service: Vascular;  Laterality: Right;  . Amputation  01/02/2012    Procedure: AMPUTATION RAY;  Surgeon: Serafina Mitchell, MD;  Location: [facility] OR;  Service: Vascular;  Laterality: Right;  . Toe amputation Right     5/13- great toe  . Exchange of a dialysis catheter N/A 07/15/2012    Procedure: EXCHANGE OF A DIALYSIS CATHETER;  Surgeon: Conrad Northwood, MD;  Location: [place];  Service: Vascular;  Laterality: N/A;  . Av fistula placement Left 07/15/2012      Procedure: ARTERIOVENOUS (AV) FISTULA CREATION;  Surgeon: Conrad Climax, MD;  Location: [place];  Service: Vascular;  Laterality: Left;  . Av fistula placement Left 09/07/2012    Procedure: INSERTION OF ARTERIOVENOUS (AV) GORE-TEX GRAFT ARM;  Surgeon: Conrad [place], MD;  Location: [place];  Service: Vascular;  Laterality: Left;  . Insertion of dialysis catheter Right 09/07/2012    Procedure: INSERTION OF a Femoral DIALYSIS CATHETER;  Surgeon: Conrad [place], MD;  Location: [place];  Service: Vascular;  Laterality: Right;  . Lipoma excision Left 09/07/2012    Procedure: EXCISION Seroma left arm;  Surgeon: Conrad [place], MD;  Location: [place];  Service: Vascular;  Laterality: Left;  . Hernia repair      umbilical  . Thrombectomy and revision of arterioventous (av) goretex  graft Left 11/30/2012    Procedure: THROMBECTOMY AND REVISION OF ARTERIOVENTOUS (AV) GORETEX  GRAFT;  Surgeon: Angelia Mould, MD;  Location: [place];  Service: Vascular;  Laterality: Left;   Social History:  reports that he has been smoking Cigarettes.  He started smoking about 49 years ago. He has a 20 pack-year smoking history. He has never used smokeless tobacco. He reports that he does not drink alcohol or use illicit drugs.  Allergies  Allergen Reactions  . Ambien [Zolpidem Tartrate] Other (See Comments)    Hallucinations    Family History  Problem Relation Age of Onset  . Breast cancer Mother   . Cancer Mother   . Cancer Father   . Anesthesia problems Neg Hx   . Hypotension Neg Hx   . Malignant hyperthermia Neg Hx   . Pseudochol deficiency Neg Hx      Prior to Admission medications   Medication Sig Start Date End Date Taking? Authorizing Provider  albuterol (PROVENTIL HFA;VENTOLIN HFA) 108 (90 BASE) MCG/ACT inhaler Inhale 2 puffs into the lungs every 6 (six) hours as needed for wheezing or shortness of breath. 11/29/13   Sharion Balloon, FNP  albuterol (PROVENTIL) (2.5 MG/3ML) 0.083% nebulizer solution Take 2.5 mg by  nebulization every 6 (six) hours as needed for wheezing or shortness of breath.    Historical Provider, MD  ALPRAZolam Duanne Moron) 1 MG tablet TAKE 1 TABLET TWICE A DAY AS NEEDED FOR ANXIETY 12/02/13   Sharion Balloon, FNP  amoxicillin-clavulanate (AUGMENTIN) 875-125 MG per tablet Take 1 tablet by mouth 2 (two) times daily. 11/29/13   Sharion Balloon, FNP  aspirin EC 81 MG tablet Take 1 tablet (81 mg total) by mouth daily. 12/01/13   Herminio Commons, MD  atorvastatin (LIPITOR) 40 MG  tablet Take 1 tablet (40 mg total) by mouth daily. 12/01/13   Herminio Commons, MD  benzonatate (TESSALON) 200 MG capsule Take 1 capsule (200 mg total) by mouth 3 (three) times daily as needed for cough. 11/29/13   Sharion Balloon, FNP  budesonide-formoterol (SYMBICORT) 160-4.5 MCG/ACT inhaler Inhale 2 puffs into the lungs 2 (two) times daily. 11/29/13   Sharion Balloon, FNP  calcium acetate (PHOSLO) 667 MG capsule Take 1,334 mg by mouth 3 (three) times daily with meals.     Historical Provider, MD  cinacalcet (SENSIPAR) 30 MG tablet Take 30 mg by mouth daily with breakfast.    Historical Provider, MD  DULoxetine (CYMBALTA) 60 MG capsule Take 60 mg by mouth daily.  11/17/13   Historical Provider, MD  folic acid (FOLVITE) 1 MG tablet Take 1 mg by mouth daily.    Historical Provider, MD  gabapentin (NEURONTIN) 300 MG capsule Take 300 mg by mouth 3 (three) times daily.    Historical Provider, MD  meloxicam (MOBIC) 7.5 MG tablet Take 7.5 mg by mouth daily. 11/17/13   Historical Provider, MD  multivitamin (RENA-VIT) TABS tablet Take 1 tablet by mouth daily.    Historical Provider, MD  omeprazole (PRILOSEC) 20 MG capsule Take 20 mg by mouth daily.    Historical Provider, MD  predniSONE (DELTASONE) 10 MG tablet Take 4 po qd x 2 days and then 3 po qd x 3 days then 2 po qd x 2 days and then 1 po qd x 2 days then stop 11/29/13   Sharion Balloon, FNP  traMADol (ULTRAM) 50 MG tablet Take 1 tablet by mouth every 6 (six) hours as needed. 11/25/13    Historical Provider, MD   Physical Exam: Filed Vitals:   12/13/13 1550 12/13/13 1613 12/13/13 1644 12/13/13 1735  BP: 71/52 124/62 96/52 89/41   Pulse:  72 76 81  Temp:      TempSrc:      Resp: 19 18 16 20   SpO2:  100% 100%     Wt Readings from Last 3 Encounters:  12/01/13 58.514 kg (129 lb)  11/29/13 59.421 kg (131 lb)  07/26/13 61.054 kg (134 lb 9.6 oz)    General:  On bipap, opens eyes to voice Eyes: PERRL, normal lids, irises & conjunctiva ENT: dry appearing mouth Cardiovascular: RRR, no m/r/g. . Respiratory: CTA bilaterally, no w/r/r. Normal respiratory effort. Abdomen: soft, ntnd Skin: no rash or induration seen on limited exam Musculoskeletal: missing toes and left foot to midfoot Psychiatric: unable to assess Neurologic: grossly non-focal- not following commands          Labs on Admission:  Basic Metabolic Panel:  Recent Labs Lab 12/13/13 1600 12/13/13 1629  NA 138 137  K 3.7 3.5*  CL 97 100  CO2 25  --   GLUCOSE 219* 227*  BUN 35* 37*  CREATININE 5.73* 5.80*  CALCIUM 8.5  --    Liver Function Tests:  Recent Labs Lab 12/13/13 1600  AST 35  ALT 9  ALKPHOS 82  BILITOT 0.4  PROT 6.6  ALBUMIN 2.5*   No results found for this basename: LIPASE, AMYLASE,  in the last 168 hours No results found for this basename: AMMONIA,  in the last 168 hours CBC:  Recent Labs Lab 12/13/13 1627 12/13/13 1629  WBC 5.6  --   NEUTROABS 3.8  --   HGB 11.0* 13.9  HCT 36.8* 41.0  MCV 93.2  --   PLT 167  --  Cardiac Enzymes: No results found for this basename: CKTOTAL, CKMB, CKMBINDEX, TROPONINI,  in the last 168 hours  BNP (last 3 results)  Recent Labs  02/08/13 1903 04/01/13 2030 06/02/13 1426  PROBNP 27711.0* 48968.0* 45128.0*   CBG:  Recent Labs Lab 12/13/13 1444 12/13/13 1504 12/13/13 1539 12/13/13 1654  GLUCAP 35* 47* 314* 191*    Radiological Exams on Admission: Dg Chest Port 1 View  12/13/2013   CLINICAL DATA:  Chest pain.  Weakness. Altered level of consciousness. Labored breathing.  EXAM: PORTABLE CHEST - 1 VIEW  COMPARISON:  09/16/2013 and 08/30/2013 and 07/08/2013  FINDINGS: The patient has chronic loculated bilateral pleural effusions as well as chronic cardiomegaly and tortuosity and calcification of the thoracic aorta.  The patient has bilateral slight pulmonary edema superimposed on these findings. No acute osseous abnormality.  IMPRESSION: Pulmonary edema superimposed on chronic cardiomegaly and loculated chronic pleural effusions.   Electronically Signed   By: Rozetta Nunnery M.D.   On: 12/13/2013 16:32    EKG: Independently reviewed. incomplete LBBB  Assessment/Plan Active Problems:   History of tobacco abuse   CKD (chronic kidney disease) stage V requiring chronic dialysis   Hypoglycemia   COPD exacerbation   Elevated troponin   COPD exacerbation with CO2 retention in a tobacco abuser- Bipap, will place in SDU, patient would not want to be intubated.   -steroids -given abx in ER, will hold off  ESRD on HD- left message with [facility] for dialysis  Elevated troponin- cycle CE- suspected elevated with renal failure, cards consult if troponin continues to rise  Hypoglycemia- replaced with D50, will try to avoid drip and excess volume.  Check BS q 2 hours for now  Hypotension- appears to be related to cuff placement, not may places able to get a good read  Metabolic encephalopathy- ?CO2 retention vs hypoglycemia -check ammonia  Poor overall prognosis  Nephrology  Code Status: DNR DVT Prophylaxis: Family Communication: no family at bedside- Rolene Arbour- [phone]- did not answer phone Disposition Plan: SDU with bipap  Time spent: 75 min  Eulogio Bear [facility] Pager [phone]

## 2013-12-13 NOTE — ED Provider Notes (Addendum)
CSN: [id number]     Arrival date & time 12/13/13  1435 History   First MD Initiated Contact with Patient 12/13/13 1446     Chief Complaint  Patient presents with  . Altered Mental Status    Level 5 caveat secondary to altered mental status (Consider location/radiation/quality/duration/timing/severity/associated sxs/prior Treatment) HPI 63 year old male on dialysis presents today from home with report that he had numbness and decreased responsiveness. EMS reports that upon their arrival his CBG was 31-1/2 an amp of D50 given with a repeat CBG of 91 and then third CBG was 23 and in second amp of epinephrine was. Have wheezing throughout all lung fields and 10 mg albuterol given. Patient states that he has not felt well and has not been taking by mouth well for 2 days. He is unable to give me more specific history. He states he hurts all over. He states that he was at home and he has aids who are there with him. He is able to tell me that his nephrologist is Dr. Luana Shu.  He states that he does not want intubation or his heart started if it stops.  Past Medical History  Diagnosis Date  . Hyperlipidemia   . Arthritis   . Claudication   . Anemia   . Chronic diastolic CHF (congestive heart failure) 07/2010    a. 07/2008 Echo EF 50-55%, mild-mod LVH, Gr 1 DD.  Marland Kitchen Peripheral vascular occlusive disease     a. 07/2011 Periph Angio: No signif Ao-illiac dzs, LCF stenosis, 100% LSFA w recon above knee pop and 3 vessel runoff, 100% RSFA w/ recon above knee --> pending L Fem to below Knee pop bypass.  Marland Kitchen History of tobacco abuse   . Hypertension     takes Amlodipine,Metoprolol,and Prinivil daily  . Dyspnea on exertion     with exertion  . Cataract     bilateral  . Dialysis patient   . PUD (peptic ulcer disease) mid 1990's    with GI bleed.  endoscoped in mid 1990's at [facility]  . Stroke 2005    no residual  . Asthma     08/2012  . GERD (gastroesophageal reflux disease)   . History of blood  transfusion     peptic ulcer  . ESRD (end stage renal disease)     a. MWF dialysis in [place] (followed by Dr. Lowanda Foster)  . COPD (chronic obstructive pulmonary disease) 04/01/2013   Past Surgical History  Procedure Laterality Date  . Arteriovenous graft placement    . Av fistula placement  12/10/2000    Right brachiocephalic arteriovenous   . Aortagram  07/29/2011    Abdominal Aortagram  . Cardiac catheterization  08/01/11    Left heart catheterization  . Femoral-popliteal bypass graft  09/09/2011    Procedure: BYPASS GRAFT FEMORAL-POPLITEAL ARTERY;  Surgeon: Serafina Mitchell, MD;  Location: [place];  Service: Vascular;  Laterality: Left;  . Amputation  09/12/2011    Procedure: AMPUTATION BELOW KNEE;  Surgeon: Serafina Mitchell, MD;  Location: [facility] OR;  Service: Vascular;  Laterality: Left;  TRANSMETATARSAL  . Application of wound vac  10/30/2011    Procedure: APPLICATION OF WOUND VAC;  Surgeon: Serafina Mitchell, MD;  Location: [place];  Service: Vascular;  Laterality: Left;  . Groin debridement  11/01/2011    Procedure: Virl Son DEBRIDEMENT;  Surgeon: Angelia Mould, MD;  Location: [facility] OR;  Service: Vascular;  Laterality: Left;  . Esophagogastroduodenoscopy  11/06/2011    Procedure: ESOPHAGOGASTRODUODENOSCOPY (EGD);  Surgeon: Jerene Bears, MD;  Location: [place];  Service: Gastroenterology;  Laterality: N/A;  . Femoral-popliteal bypass graft  01/02/2012    Procedure: BYPASS GRAFT FEMORAL-POPLITEAL ARTERY;  Surgeon: Serafina Mitchell, MD;  Location: [place];  Service: Vascular;  Laterality: Right;  . Amputation  01/02/2012    Procedure: AMPUTATION Kiyana Vazguez;  Surgeon: Serafina Mitchell, MD;  Location: [facility] OR;  Service: Vascular;  Laterality: Right;  . Toe amputation Right     5/13- great toe  . Exchange of a dialysis catheter N/A 07/15/2012    Procedure: EXCHANGE OF A DIALYSIS CATHETER;  Surgeon: Conrad [place], MD;  Location: [place];  Service: Vascular;  Laterality: N/A;  . Av fistula placement Left 07/15/2012     Procedure: ARTERIOVENOUS (AV) FISTULA CREATION;  Surgeon: Conrad Inkster, MD;  Location: [place];  Service: Vascular;  Laterality: Left;  . Av fistula placement Left 09/07/2012    Procedure: INSERTION OF ARTERIOVENOUS (AV) GORE-TEX GRAFT ARM;  Surgeon: Conrad [place], MD;  Location: [place];  Service: Vascular;  Laterality: Left;  . Insertion of dialysis catheter Right 09/07/2012    Procedure: INSERTION OF a Femoral DIALYSIS CATHETER;  Surgeon: Conrad Carl, MD;  Location: [place];  Service: Vascular;  Laterality: Right;  . Lipoma excision Left 09/07/2012    Procedure: EXCISION Seroma left arm;  Surgeon: Conrad Fromberg, MD;  Location: [place];  Service: Vascular;  Laterality: Left;  . Hernia repair      umbilical  . Thrombectomy and revision of arterioventous (av) goretex  graft Left 11/30/2012    Procedure: THROMBECTOMY AND REVISION OF ARTERIOVENTOUS (AV) GORETEX  GRAFT;  Surgeon: Angelia Mould, MD;  Location: [facility] OR;  Service: Vascular;  Laterality: Left;   Family History  Problem Relation Age of Onset  . Breast cancer Mother   . Cancer Mother   . Cancer Father   . Anesthesia problems Neg Hx   . Hypotension Neg Hx   . Malignant hyperthermia Neg Hx   . Pseudochol deficiency Neg Hx    History  Substance Use Topics  . Smoking status: Current Every Day Smoker -- 0.50 packs/day for 40 years    Types: Cigarettes    Start date: 12/20/1964  . Smokeless tobacco: Never Used     Comment: quit, but still "sneaks a smoke" occasionally  . Alcohol Use: No    Review of Systems  All other systems reviewed and are negative.     Allergies  Ambien  Home Medications   Prior to Admission medications   Medication Sig Start Date End Date Taking? Authorizing Provider  albuterol (PROVENTIL HFA;VENTOLIN HFA) 108 (90 BASE) MCG/ACT inhaler Inhale 2 puffs into the lungs every 6 (six) hours as needed for wheezing or shortness of breath. 11/29/13   Sharion Balloon, FNP  albuterol (PROVENTIL) (2.5 MG/3ML) 0.083%  nebulizer solution Take 2.5 mg by nebulization every 6 (six) hours as needed for wheezing or shortness of breath.    Historical Provider, MD  ALPRAZolam Duanne Moron) 1 MG tablet TAKE 1 TABLET TWICE A DAY AS NEEDED FOR ANXIETY 12/02/13   Sharion Balloon, FNP  amoxicillin-clavulanate (AUGMENTIN) 875-125 MG per tablet Take 1 tablet by mouth 2 (two) times daily. 11/29/13   Sharion Balloon, FNP  aspirin EC 81 MG tablet Take 1 tablet (81 mg total) by mouth daily. 12/01/13   Herminio Commons, MD  atorvastatin (LIPITOR) 40 MG tablet Take 1 tablet (40 mg total) by mouth daily.  12/01/13   Herminio Commons, MD  benzonatate (TESSALON) 200 MG capsule Take 1 capsule (200 mg total) by mouth 3 (three) times daily as needed for cough. 11/29/13   Sharion Balloon, FNP  budesonide-formoterol (SYMBICORT) 160-4.5 MCG/ACT inhaler Inhale 2 puffs into the lungs 2 (two) times daily. 11/29/13   Sharion Balloon, FNP  calcium acetate (PHOSLO) 667 MG capsule Take 1,334 mg by mouth 3 (three) times daily with meals.     Historical Provider, MD  cinacalcet (SENSIPAR) 30 MG tablet Take 30 mg by mouth daily with breakfast.    Historical Provider, MD  DULoxetine (CYMBALTA) 60 MG capsule Take 60 mg by mouth daily.  11/17/13   Historical Provider, MD  folic acid (FOLVITE) 1 MG tablet Take 1 mg by mouth daily.    Historical Provider, MD  gabapentin (NEURONTIN) 300 MG capsule Take 300 mg by mouth 3 (three) times daily.    Historical Provider, MD  multivitamin (RENA-VIT) TABS tablet Take 1 tablet by mouth daily.    Historical Provider, MD  omeprazole (PRILOSEC) 20 MG capsule Take 20 mg by mouth daily.    Historical Provider, MD  predniSONE (DELTASONE) 10 MG tablet Take 4 po qd x 2 days and then 3 po qd x 3 days then 2 po qd x 2 days and then 1 po qd x 2 days then stop 11/29/13   Sharion Balloon, FNP  traMADol (ULTRAM) 50 MG tablet Take 1 tablet by mouth every 6 (six) hours as needed. 11/25/13   Historical Provider, MD   BP 96/52  Pulse 76  Temp(Src)  97.7 F (36.5 C) (Rectal)  Resp 16  SpO2 100% Physical Exam  Nursing note and vitals reviewed. Constitutional: He appears well-developed and well-nourished.  HENT:  Head: Normocephalic and atraumatic.  Mucous membranes dry. Crusting noted on lips  Eyes: Conjunctivae and EOM are normal. Pupils are equal, round, and reactive to light.  Bilateral cataracts  Neck: Normal range of motion. Neck supple.  Cardiovascular: Normal rate and regular rhythm.   Pulmonary/Chest:  Diffuse rhonchi  Abdominal: Soft.  Mild diffuse ttp  Genitourinary: Rectum normal and penis normal.  Musculoskeletal: Normal range of motion.  Bilateral lower extremity amputations  Neurological: He is alert. No cranial nerve deficit.  Oriented to person, place, and month  Skin: Skin is warm and dry.    ED Course  CENTRAL LINE Date/Time: 12/13/2013 5:16 PM Performed by: Shaune Pollack Authorized by: Shaune Pollack Consent: Verbal consent obtained. Risks and benefits: risks, benefits and alternatives were discussed Consent given by: patient Patient identity confirmed: verbally with patient Time out: Immediately prior to procedure a "time out" was called to verify the correct patient, procedure, equipment, support staff and site/side marked as required. Indications: vascular access Anesthesia: local infiltration Local anesthetic: lidocaine 1% without epinephrine Anesthetic total: 3 ml Patient sedated: no Preparation: skin prepped with 2% chlorhexidine Skin prep agent dried: skin prep agent completely dried prior to procedure Sterile barriers: all five maximum sterile barriers used - cap, mask, sterile gown, sterile gloves, and large sterile sheet Hand hygiene: hand hygiene performed prior to central venous catheter insertion Location details: left femoral Site selection rationale: patient on dialysis with upper extremity graft and other extremity with clotted fistula Patient position: flat Catheter type:  triple lumen Pre-procedure: landmarks identified Ultrasound guidance: yes Number of attempts: 2 Successful placement: yes Post-procedure: line sutured and dressing applied Assessment: blood return through all ports and free fluid flow Patient tolerance: Patient  tolerated the procedure well with no immediate complications.   (including critical care time) Labs Review Labs Reviewed  CBC WITH DIFFERENTIAL - Abnormal; Notable for the following:    RBC 3.95 (*)    Hemoglobin 11.0 (*)    HCT 36.8 (*)    MCHC 29.9 (*)    RDW 19.5 (*)    Monocytes Relative 13 (*)    All other components within normal limits  I-STAT TROPOININ, ED - Abnormal; Notable for the following:    Troponin i, poc 0.64 (*)    All other components within normal limits  I-STAT ARTERIAL BLOOD GAS, ED - Abnormal; Notable for the following:    pH, Arterial 7.243 (*)    pCO2 arterial 66.2 (*)    Bicarbonate 28.5 (*)    All other components within normal limits  CBG MONITORING, ED - Abnormal; Notable for the following:    Glucose-Capillary 35 (*)    All other components within normal limits  CBG MONITORING, ED - Abnormal; Notable for the following:    Glucose-Capillary 47 (*)    All other components within normal limits  CBG MONITORING, ED - Abnormal; Notable for the following:    Glucose-Capillary 314 (*)    All other components within normal limits  I-STAT CHEM 8, ED - Abnormal; Notable for the following:    Potassium 3.5 (*)    BUN 37 (*)    Creatinine, Ser 5.80 (*)    Glucose, Bld 227 (*)    Calcium, Ion 1.06 (*)    All other components within normal limits  COMPREHENSIVE METABOLIC PANEL  LACTIC ACID, PLASMA  URINALYSIS, ROUTINE W REFLEX MICROSCOPIC  I-STAT CREATININE, ED  I-STAT CG4 LACTIC ACID, ED    Imaging Review Dg Chest Port 1 View  12/13/2013   CLINICAL DATA:  Chest pain. Weakness. Altered level of consciousness. Labored breathing.  EXAM: PORTABLE CHEST - 1 VIEW  COMPARISON:  09/16/2013 and  08/30/2013 and 07/08/2013  FINDINGS: The patient has chronic loculated bilateral pleural effusions as well as chronic cardiomegaly and tortuosity and calcification of the thoracic aorta.  The patient has bilateral slight pulmonary edema superimposed on these findings. No acute osseous abnormality.  IMPRESSION: Pulmonary edema superimposed on chronic cardiomegaly and loculated chronic pleural effusions.   Electronically Signed   By: Rozetta Nunnery M.D.   On: 12/13/2013 16:32     EKG Interpretation   Date/Time:  Tuesday December 13 2013 15:00:24 EDT Ventricular Rate:  84 PR Interval:  198 QRS Duration: 112 QT Interval:  411 QTC Calculation: 486 R Axis:   67 Text Interpretation:  Normal sinus rhythm Incomplete left bundle branch  block nsst consider inferolateral ischemia Confirmed by Chermaine Schnyder MD, Andee Poles  ([id number]) on 12/13/2013 4:02:49 PM      MDM   Final diagnoses:  Hypoglycemia  Acute on chronic respiratory failure with hypercapnia  Chronic obstructive pulmonary disease with acute exacerbation  Elevated troponin    63 y.o. Male on dialysis with decreased loc likely secondary to hypoglycemia.  Patient with hypotension here responsive to iv fluids but now with volume overload on cxr.  Patient with known copd.  Patient on bipap and albuterol and steroids given.  Patient given iv antibiotics to cover possible hcap. EKG stable from prior.  Troponin elevated.   I discussed intubation with patient and his brother who is next of kin.  Patient does not want cpr or intubation.  Brother voices that these wishes have been present prior to now.  Patient care discussed with Dr. Lucianne Lei and she will admit.   Dr. Burt Knack consulted per Dr. Gaynelle Adu request.  CRITICAL CARE Performed by: Shaune Pollack Total critical care time: 75 Critical care time was exclusive of separately billable procedures and treating other patients. Critical care was necessary to treat or prevent imminent or life-threatening  deterioration. Critical care was time spent personally by me on the following activities: development of treatment plan with patient and/or surrogate as well as nursing, discussions with consultants, evaluation of patient's response to treatment, examination of patient, obtaining history from patient or surrogate, ordering and performing treatments and interventions, ordering and review of laboratory studies, ordering and review of radiographic studies, pulse oximetry and re-evaluation of patient's condition.    Shaune Pollack, MD 12/13/13 [place] Jerod Mcquain, MD 12/13/13 1916

## 2013-12-13 NOTE — ED Notes (Signed)
To ED from home via [facility], dialysis yesterday, weak and ALOC today, CBG 31, 1 Amp D50 given with CBG 91, repeat CBG 23 with 2nd Amp given, wheezing throughout, 10mg  Alb, 1 mg Atrovent with improvement, CBG 35 on arrival, D 50 given and Dr Jeanell Sparrow to bedside

## 2013-12-13 NOTE — Progress Notes (Signed)
Pt arrived to unit accompanied by RN and RTT. Pt is on BiPAP, placed on cardiac monitor. Dr. Justin Mend with [facility] paged to eval pt for dialysis, he is on his way in to see pt. Pt is minimally responsive to voice and pain. Will continue to monitor pt's VS. Dr. Justin Mend is attempting to get in touch with pt's wife.

## 2013-12-13 NOTE — Progress Notes (Signed)
Called brother Rolene Arbour again to discuss poor prognosis and withdrawing care- had to leave message as no answer  Eulogio Bear

## 2013-12-13 NOTE — Plan of Care (Addendum)
Brief note, this NP was called by attending at shift change and informed of pt's grave condition and likely impending death. Pt expressed wishes to be DNR/DNI in ED and chart reflects that. However, pt deteriorated further and attending attempted to get in touch with family and was unsuccessful. Brother was apparently present in the ED but left, and no other family at hospital.  NP to bedside. This NP has been dealing with situation since 1910 hours. Was unable to get in touch with any family in spite of calling several numbers on the chart.  Because of grave situation, called Dr. Hal Hope of [facility] and related condition of pt and that he was actively dying and any further escalation of care at this time was futile. Upon Dr. Marthann Schiller instruction, this NP called [facility] and spoke to Dr. Alva Garnet. He reviewed chart and stated he too agreed that any further care would be futile. While speaking with Dr. Alva Garnet, 2 family members came in, but neither are close blood relatives and this NP expressed desire to speak with nearest blood relative by phone since no one else was here at that time. Family members never got anyone on the phone for this NP to speak with. This NP spoke with Dr. Justin Mend, nephro, who had been called for consult. Dr. Justin Mend told me he had previously spoke to pt's wife and informed her that pt was no longer a candidate for HD and that HD at this time would not "fix" the problem. This NP did discuss the gravity of the situation and that further care would likely not change the pt's outcome with the 2 family members present until further family members could arrive.  Finally, after another hour or so, other family came in including wife and 2 sons who are the nearest relatives. (please note there was some questions as to whether this woman was the pt's wife or girlfriend but sons enured me she was truly his wife in legal marriage).  Will finish charting note but for now, wife agrees not to elevate care and will  consider moving to complete comfort care and removing bipap later tonight after they have had time to spend with the pt. Will f/up with family.  Clance Boll, NP [facility] Addendum: Some of family said pt was moving his legs. NP to bedside. Pt opened eyes and mumbled to tactile stimulation. She shook his head "no" that he is not in pain. Can not do ROS or neuro exam due to bipap.  I explained to family at bedside that he is likely more alert secondary to Bipap getting rid of PCO2 and that this doesn';t change the big picture of how sick he is and that he has multi system organ failure.  Later, this NP came back to speak to wife and she has gone home. Will attempt to get her number from the son and speak to her about possible cardio consult for elevated troponins vs. with holding care and making pt true comfort care. Pt is not a candidate for HD anymore and would not, in my opinion, be a candidate for invasive cardiac procedure should he have had an MI. Am repeating ABG, 12 lead and troponin at this time. Will follow.  If wife wants to proceed with care, will call [facility] consult. I spoke to Dr. Hal Hope of [facility] about situation and he suggested I again speak to wife about further plan given the elevated troponin. ABG showed no improvement in respiratory failure with bipap. EKG with [place]  abnormality ? Ischemia. Troponin pending.  While on unit, son came out to speak to this NP. Discussed further plan and that pt would probably not be a candidate for any invasive procedure at this point including "fixing" any arteries that may be blocked. Son states he doesn't want his father to suffer. However, he agrees to get an opinion from [facility] and go with further recommendations. I communicated to son that I just wanted to give them enough information to make them feel more comfortable with their decisions. I explained that we can just keep doing what we are doing and see how he does over the next  24 hours or so, but I still didn't think he is likely to recover from the organ damage. Son knows he is a DNR. Reluctant to "give up" at this point. This NP feels that it would be prudent to get critical care's input on situation to help family in further decision making. Will call [facility].  KJKG, NP Addendum: Spoke to Dr. Lamonte Sakai of [facility]. Reviewed chart, labs, xrays, etc. Dr. Lamonte Sakai agrees that escalating care is not in pt's best interest and would only prolong suffering. Agrees pt is in MSOF and he is not a candidate for HD or other invasive procedures. Dr. Lamonte Sakai disagreed with using bipap further to treat respiratory failure as the 2nd ABG showed no improvement on bipap. After discussion, this NP spoke to son who agrees to discontinue bipap and make pt comfort care. Son spoke to pt's wife who also agrees. Son says that pt's wife is not returning to hospital tonight. Pt's daughter at bedside and she also agrees. Morphine continues, will d/c other care at this time, but doubt pt will survive long after bipap is discontinued as he was continually desatting while on the bipap.  Clance Boll, NP [facility]

## 2013-12-13 NOTE — ED Notes (Signed)
Rolene Arbour, brother, [phone]

## 2013-12-13 NOTE — Progress Notes (Signed)
Pt not easily aroused. Despite increasing support on Bipap pt has very little effort. RT will inform MD.

## 2013-12-14 LAB — BLOOD GAS, ARTERIAL
Acid-base deficit: 1.3 mmol/L (ref 0.0–2.0)
BICARBONATE: 26.4 meq/L — AB (ref 20.0–24.0)
Delivery systems: POSITIVE
Drawn by: 28701
Expiratory PAP: 12
FIO2: 60 %
Inspiratory PAP: 22
Mode: POSITIVE
O2 Saturation: 99.1 %
PATIENT TEMPERATURE: 97.7
PH ART: 7.175 — AB (ref 7.350–7.450)
PO2 ART: 173 mmHg — AB (ref 80.0–100.0)
TCO2: 28.7 mmol/L (ref 0–100)
pCO2 arterial: 73.8 mmHg (ref 35.0–45.0)

## 2013-12-14 LAB — GLUCOSE, CAPILLARY
Glucose-Capillary: 146 mg/dL — ABNORMAL HIGH (ref 70–99)
Glucose-Capillary: 162 mg/dL — ABNORMAL HIGH (ref 70–99)

## 2013-12-14 LAB — TROPONIN I: TROPONIN I: 4.38 ng/mL — AB (ref <0.30)

## 2013-12-14 MED ORDER — MORPHINE SULFATE 2 MG/ML IJ SOLN
2.0000 mg | INTRAMUSCULAR | Status: DC | PRN
Start: 2013-12-14 — End: 2013-12-15
  Administered 2013-12-14 – 2013-12-15 (×3): 2 mg via INTRAVENOUS
  Filled 2013-12-14 (×3): qty 1

## 2013-12-14 MED ORDER — LORAZEPAM 2 MG/ML IJ SOLN
1.0000 mg | INTRAMUSCULAR | Status: DC | PRN
  Administered 2013-12-14: 1 mg via INTRAVENOUS
  Filled 2013-12-14: qty 1

## 2013-12-14 MED ORDER — ATROPINE SULFATE 1 % OP SOLN
1.0000 [drp] | OPHTHALMIC | Status: DC | PRN
  Filled 2013-12-14: qty 2

## 2013-12-14 MED ORDER — ATROPINE ORAL SOLUTION 0.08 MG/ML
0.8000 mg | ORAL | Status: DC | PRN
  Filled 2013-12-14: qty 10

## 2013-12-14 MED ORDER — ONDANSETRON HCL 4 MG/2ML IJ SOLN: 4.0000 mg | INTRAMUSCULAR | Status: DC | PRN

## 2013-12-14 MED ORDER — ATROPINE SULFATE 1 % OP SOLN
2.0000 [drp] | OPHTHALMIC | Status: DC | PRN
  Filled 2013-12-14: qty 2

## 2013-12-14 MED ORDER — LORAZEPAM 2 MG/ML IJ SOLN: 1.0000 mg | INTRAMUSCULAR | Status: DC | PRN

## 2013-12-14 NOTE — Progress Notes (Signed)
Panic values from ABG called to this RT from April Hunt, RRT, RCP.  Forwarded values to both RN and Clance Boll, NP (pH 7.175, CO2 73.8, Po2 173, CO3 26.4).

## 2013-12-14 NOTE — Progress Notes (Signed)
eLink Physician-Brief Progress Note Patient Name: [name] DOB: 13-Sep-1950 MRN: [id number]   Date of Service  12/14/2013  HPI/Events of Note  Reviewed the patient's history and presentation with K. Kirby-Graham this evening. Also reviewed chart, labs, films. He has MODS with progressive respiratory failure this pm. I do not see any role for escalation - he is not an HD or cardiac cath candidate, would do extremely poorly with MV, in fact would likely not be extubated. He has made clear that his code status is DNR. I support the plan of initiation of comfort meds. BiPAP would seem to be reasonable IF it is being used to prevent work of breathing. I would not continue BiPAP to treat his respiratory failure, prolong discomfort  eICU Interventions       Intervention Category Major Interventions: Respiratory failure - evaluation and management  Taina Landry S. 12/14/2013, 1:56 AM

## 2013-12-14 NOTE — Progress Notes (Signed)
Utilization review completed.  

## 2013-12-14 NOTE — Progress Notes (Signed)
Per family request and MD recommendation Pt removed from BiPAP and placed on 4 lpm [place].

## 2013-12-14 NOTE — Progress Notes (Signed)
CRITICAL VALUE ALERT  Critical value received:  Troponin 3.58  Date of notification:  12/13/13  Time of notification:  2148  Critical value read back:Yes.    Nurse who received alert:  K. Royden Purl  MD notified (1st page):  Lionel December, NP  Time of first page:  2200  MD notified (2nd page):  Time of second page:  Responding MD:  Lionel December, NP  Time MD responded:  2205

## 2013-12-14 NOTE — Progress Notes (Signed)
TEAM 1 - Stepdown/ICU TEAM Progress Note  [name] NLG:[id number] DOB: January 25, 1951 DOA: 12/13/2013 PCP: Redge Gainer, MD  Admit HPI / Brief Narrative: 63 yo male brought in by EMS. He was found to be altered and weak per EMS report. His Blood sugar was 31- given d50 and improved to 91. He then dropped back to 23 and 2nd amp was given. He then dropped to 35 and d50 was given again.  In the ER, he was found to be wheezing. Patient was c/o pain all over and decreased appetite. He and his brother expressed to the ER that he does not wish to have CPR or be intubated. He was found to have a low pH, elevated PCO2 and was placed on bipap. His blood sugars stabilized in the ER. He was given abx and steroids. His chest x ray showed fluid overload. His troponin was mildly elevated as well.   Following his admission, the pt decompensated further from a respiratory standpoint.  Despite use of BIPAP, his ABG failed to show evidence of improving resp status.  Nephrology was consulted, and did not feel he was an appropriate candidate for ongoing HD.  After a lengthy discussion w/ the family, the night team transitioned him to a comfort focused tx plan.    HPI/Subjective: Pt is obtunded th/o most of my interview.  He did at one point open his eyes, but did not communicate with the examiner or his loved ones.  He did not appear to be uncomfortable.    Assessment/Plan:  The decision has been made by family to pursue comfort directed care.  The pt appears comfortable at the time of my visit.  The family did express a desire to resume aggressive medical care to include HD should the pt prove to make an unexpected recovery in regard to his resp status.  The following are the active issues at the present time:  Acute severe hypercarbic respiratory failure due to acute COPD exacerbation   ESRD on HD  Elevated troponin  Chronic diastolic & systolic CHF  echocardiogram on 04/04/2013 demonstrated  mildly reduced left vent systolic function, EF 08-14% with mild LVH, grade 1 diastolic dysfunction and mild biatrial enlargement.  Hypoglycemia  Hypotension  Metabolic encephalopathy  PVD  Code Status: FULL Family Communication: spoke w/ multiple family members at the bedside at length  Disposition Plan: SDU for comfort only care until trajectory of illness more clear   Consultants: Nephrology PCCM  Procedures: none  Antibiotics: Zosyn 9/22 Vanc 9/22  DVT prophylaxis: comfort only  Objective: Blood pressure 91/50, pulse 66, temperature 97.4 F (36.3 C), temperature source Axillary, resp. rate 29, SpO2 99.00%.  Intake/Output Summary (Last 24 hours) at 12/14/13 1309 Last data filed at 12/14/13 1100  Gross per 24 hour  Intake      0 ml  Output      0 ml  Net      0 ml   Exam: General: No acute respiratory distress at present Pulm:  No evidence of sob - no discomfort evident  Data Reviewed: Basic Metabolic Panel:  Recent Labs Lab 12/13/13 1600 12/13/13 1629  NA 138 137  K 3.7 3.5*  CL 97 100  CO2 25  --   GLUCOSE 219* 227*  BUN 35* 37*  CREATININE 5.73* 5.80*  CALCIUM 8.5  --    Liver Function Tests:  Recent Labs Lab 12/13/13 1600  AST 35  ALT 9  ALKPHOS 82  BILITOT 0.4  PROT  6.6  ALBUMIN 2.5*    Recent Labs Lab 12/13/13 1800  AMMONIA 19   CBC:  Recent Labs Lab 12/13/13 1627 12/13/13 1629  WBC 5.6  --   NEUTROABS 3.8  --   HGB 11.0* 13.9  HCT 36.8* 41.0  MCV 93.2  --   PLT 167  --     Cardiac Enzymes:  Recent Labs Lab 12/13/13 1837 12/14/13 0037  TROPONINI 3.58* 4.38*   BNP (last 3 results)  Recent Labs  02/08/13 1903 04/01/13 2030 06/02/13 1426  PROBNP 27711.0* 48968.0* 45128.0*    CBG:  Recent Labs Lab 12/13/13 1924 12/13/13 2020 12/13/13 2249 12/14/13 0033 12/14/13 0331  GLUCAP 90 96 131* 146* 162*   Studies:  Recent x-ray studies have been reviewed in detail by the Attending  Physician  Scheduled Meds:  Scheduled Meds: . sodium chloride  3 mL Intravenous Q12H    Time spent on care of this patient: 25 mins   Jasmon Graffam T , MD   [facility] Office  [phone] Pager - Text Page per Shea Evans as per below:  On-Call/Text Page:      [email or web address]      password [email or web address]  If 7PM-7AM, please contact night-coverage [email or web address] Password [email or web address] 12/14/2013, 1:09 PM   LOS: 1 day

## 2013-12-14 NOTE — Progress Notes (Signed)
Pt is unresponsive. T 94.3 (axillary) BP 70s-80/30s-40s, MAP = 42. HR 50s SpO2 80s-90s on bipap. NP notified and is at the bedside with pt and family. Will continue to monitor.

## 2013-12-14 NOTE — Plan of Care (Signed)
NP received a page from RN awhile ago stating pt wanted to "go home". Stated if he was going to die, he wanted to do it at home. Family was OK with this and son was going to stay with him tonight. This NP spoke to son and to pt discouraging this stating they would not have access to home health or hospice, pain meds or support at home. [name] is alert and oriented and knows he is in the hospital and why. He is at peace with his situation and knows that he will likely die soon. He knows he is no longer a candidate for HD and says he is "tired anyway". He is aware that he likely had a MI this hospitalization that can not be treated in his condition also. He doesn't recall the events of last night.  After speaking with him on the phone and in person, I am confident of his ability to make this decision and believe he totally understands the situation and his poor prognosis. After discussion with RN and this NP, pt decided to stay until tomorrow so that home services can be set up to get him some help and support at home; as well as, medications to make this process more bearable at home.  Son is OK with this as well.  Clance Boll, NP [facility]

## 2013-12-14 NOTE — Progress Notes (Signed)
CRITICAL VALUE ALERT  Critical value received:  Troponin 4.38  Date of notification:  12/14/2013  Time of notification:  0130 (read by RN)  Critical value read back:No.  Nurse who received alert:  K. Royden Purl, RN  MD notified (1st page):    Time of first page:  NP at bedside  MD notified (2nd page):  Time of second page:  Responding MD:  Lionel December, NP at bedside  Time MD responded:     New orders received to do a 12 lead EKG. Will carry out orders and continue to monitor closely.

## 2013-12-15 DIAGNOSIS — I959 Hypotension, unspecified: Secondary | ICD-10-CM

## 2013-12-15 DIAGNOSIS — G9341 Metabolic encephalopathy: Secondary | ICD-10-CM

## 2013-12-15 DIAGNOSIS — I5042 Chronic combined systolic (congestive) and diastolic (congestive) heart failure: Secondary | ICD-10-CM

## 2013-12-15 DIAGNOSIS — I509 Heart failure, unspecified: Secondary | ICD-10-CM

## 2013-12-15 LAB — GLUCOSE, CAPILLARY: Glucose-Capillary: 17 mg/dL — CL (ref 70–99)

## 2013-12-15 MED ORDER — MORPHINE SULFATE (CONCENTRATE) 10 MG /0.5 ML PO SOLN: 5.0000 mg | ORAL | Status: AC | PRN

## 2013-12-15 MED ORDER — LORAZEPAM 2 MG/ML IJ SOLN: 1.0000 mg | INTRAMUSCULAR | Status: DC | PRN

## 2013-12-15 MED ORDER — ONDANSETRON HCL 4 MG/2ML IJ SOLN: 4.0000 mg | INTRAMUSCULAR | Status: DC | PRN

## 2013-12-15 MED ORDER — SCOPOLAMINE 1 MG/3DAYS TD PT72: 1.0000 | MEDICATED_PATCH | TRANSDERMAL | Status: AC

## 2013-12-15 MED ORDER — LORAZEPAM 1 MG PO TABS: 1.0000 mg | ORAL_TABLET | ORAL | Status: AC | PRN

## 2013-12-15 MED ORDER — MORPHINE SULFATE (CONCENTRATE) 10 MG /0.5 ML PO SOLN: 5.0000 mg | ORAL | Status: DC | PRN

## 2013-12-15 MED ORDER — SCOPOLAMINE 1 MG/3DAYS TD PT72
1.0000 | MEDICATED_PATCH | TRANSDERMAL | Status: DC
  Administered 2013-12-15: 1.5 mg via TRANSDERMAL
  Filled 2013-12-15: qty 1

## 2013-12-15 NOTE — Progress Notes (Signed)
Nutrition Brief Note  Chart reviewed due to low Braden score. Pt transitioning to comfort care.  No nutrition interventions warranted at this time.  Please consult as needed.   Eloisa Chokshi, RD, LDN, CNSC Pager [phone] After Hours Pager [phone]    

## 2013-12-15 NOTE — Progress Notes (Signed)
Pt not on bipap at this time.

## 2013-12-15 NOTE — Progress Notes (Signed)
Notified by [name], patient and family request services of [name]) after discharge. Patient information reviewed with Earl Alferd Patee, [place] Director hospice eligible with ESRD 585.6.  Spoke with pt and son [name] in room (a male family member also present did not introduce herself) initiated education related to hospice services, philosophy and team approach to care with good understanding voiced by son. Both son and patient discussed with this Probation officer they understand patient is declining, they confirmed previous discussions r/t ongoing HD may not be tolerated or of benefit and pt stated he does not wish to continue HD.   Per notes/discussion plan is to discharge as soon as medically ready. Pt and son state they want pt to transport home ASAP and it is OK with them if pt arrives prior to the hospital bed- as son will be in the home and able to assist with pt *Pt will transport by [name] confirmed pt has a home aide through [name] and this will be able to continue once home- son [name] aware of this *Please send completed GOLD DNR form home with pt  *Discussed with son [name] [phone], question has arisen who is primary contact person - pt has spouse listed in [name] relayed to Probation officer he has spoken with his father and his father relayed that [name] and he were not together and he wanted [name] to be the contact person; [name] relayed he has been the Media planner during the last few days in the hospital and will continue to be with his father once he returns home  DME needs discussed with pt/son - [facility] DME manager [name] contacted to place order with [facility] for delivery of Complete Pkg D: fully electric hospital bed with half rails upper & lower, AP&P mattress, over-bed table and add a 3n1 BSC - per family pt currently has O2 and Neb machine and walker in the home through [name]  Patient's PCP: [name]  pharmacy: [name] Initial paperwork faxed to [name]  Please notify [facility] when patient is ready to leave unit at d/c call [phone] (or [phone] if after 5 pm);  [facility] information and contact numbers also given to son [name] during visit.   Above information shared with [name] Please call with any questions or concerns   Danton Sewer, RN 12/15/2013, 2:13 PM Hospice and [place] RN Liaison [phone]

## 2013-12-15 NOTE — Care Management Note (Signed)
    Page 1 of 2   12/15/2013     3:07:13 PM CARE MANAGEMENT NOTE 12/15/2013  Patient:  [name], [name]   Account Number:  [id number]  Date Initiated:  12/14/2013  Documentation initiated by:  Marvetta Gibbons  Subjective/Objective Assessment:   Pt admitted with hypoglycemia, COPD with acute resp. distress     Action/Plan:   PTA Pt lived at home   Anticipated DC Date:  12/15/2013   Anticipated DC Plan:  [place] referral  Clinical Social Worker      DC Forensic scientist  CM consult      PAC Choice  HOSPICE   Choice offered to / List presented to:  C-4 Adult Children   DME arranged  OTHER - SEE COMMENT      DME agency  [place] arranged  [facility] DISEASE MANAGEMENT      HH agency  [facility] [place]   Status of service:  Completed, signed off Medicare Important Message given?  NA - LOS <3 / Initial given by admissions (If response is "NO", the following Medicare IM given date fields will be blank) Date Medicare IM given:   Medicare IM given by:   Date Additional Medicare IM given:   Additional Medicare IM given by:    Discharge Disposition:  [place]  Per UR Regulation:  Reviewed for med. necessity/level of care/duration of stay  If discussed at [place] of Stay Meetings, dates discussed:    Comments:  12/15/13- 1000- Marvetta Gibbons RN, BSN [phone] Referral for home with Hospice received- confirmed with Dr. Sherral Hammers pt for d/c today once everything ready- spoke with pt and son Ruston at bedside regarding plan to return home with Hospice- pt lives in [place]- list of Hospice agencies for [facility] given to pt and son- per choice they would like to use [facility]- referral called in to referral line at [facility]- discussed DME needs- per pt he wants to go home and sleep in his bed and does not want a hospital bed at this time- already has RW, [facility]- and home 02- confirmed with [facility] that pt has 02 with  them. Margie with [facility] to come speak with pt and family later this AM. plan is for pt to go home by ambulance when everything in place. Any DME needs will be arranged by [facility] through [facility]-

## 2013-12-15 NOTE — Discharge Summary (Addendum)
Physician Discharge Summary  [name] PJA:[id number] DOB: Mar 03, 1951 DOA: 12/13/2013  PCP: Redge Gainer, MD  Admit date: 12/13/2013 Discharge date: 12/15/2013  Time spent: 86minutes  Recommendations for Outpatient Follow-up:  Acute severe hypercarbic respiratory failure due to acute COPD exacerbation   ESRD on HD M/W/F (followed by Dr. Lowanda Foster) -Patient understands that he is a poor candidate for continued hemodialysis and does not want to continue on hemodialysis. Patient understands that he will have a few days to no more than a couple weeks upon discharge and is at peace with this decision. -Have spoken with NCM Kristi spoken with the patient and family and is arranging home hospice. -Discharge on home hospice  Elevated troponin  -No further diagnostic workup warranted -Discharge on home hospice  Chronic diastolic & systolic CHF  -echocardiogram on 04/04/2013 demonstrated mildly reduced left vent systolic function, EF 34-19% with mild LVH, grade 1 diastolic dysfunction and mild biatrial enlargement.  -Discharge on home hospice  Hypoglycemia  -Resolved  Hypotension  -Continue hypotension however patient asymptomatic; mentating well  Metabolic encephalopathy  -Discharge on home hospice  PVD -Discharge on home hospice   Discharge Diagnoses:  Active Problems:   History of tobacco abuse   CKD (chronic kidney disease) stage V requiring chronic dialysis   Atrial fibrillation   ESRD on hemodialysis   Hypotension   Mediastinal lymphadenopathy   Systolic and diastolic CHF, chronic   Hypoglycemia   COPD exacerbation   Elevated troponin   Discharge Condition: Hospice  Diet recommendation: Regular   There were no vitals filed for this visit.  History of present illness:  63 yo BM PMHx  chronic combined systolic and diastolic CHF, mediastinal lymphadenopathy, COPD, atrial fibrillation/flutter, end-stage renal disease on hemodialysis. Brought in by EMS. He was  found to be altered and weak per EMS report. His Blood sugar was 31- given d50 and improved to 91. He then dropped back to 23 and 2nd amp was given. He then dropped to 35 and d50 was given again. In the ER, he was found to be wheezing. Patient was c/o pain all over and decreased appetite. He and his brother expressed to the ER that he does not wish to have CPR or be intubated. He was found to have a low pH, elevated PCO2 and was placed on bipap. His blood sugars stabilized in the ER. He was given abx and steroids. His chest x ray showed fluid overload. His troponin was mildly elevated as well.  Following his admission, the pt decompensated further from a respiratory standpoint. Despite use of BIPAP, his ABG failed to show evidence of improving resp status. Nephrology was consulted, and did not feel he was an appropriate candidate for ongoing HD.  9/24 After a lengthy discussion w/ the family, the night team transitioned him to a comfort focused tx plan. Patient woke up overnight and became A./O. x4 and stated to his family and medical staff that he understood that he was gravely ill and dying, and wished to go home to die. Medical staff convinced patient to remain in the hospital until today in order that we could arrange hospice care. Currently patient states that he did have a discussion overnight with the medical staff stating that he wanted to be discharged no later than today, and the family members present in his room currently concur with his decision.   Procedures: 04/04/2013 echocardiogram;- Left ventricle: mild LVH.  -LVEF=45% to 50%. - (grade 1 diastolic dysfunction). -Left/Rt atrium: mildly dilated.  Consultations: Dr. Pattricia Boss (PCCM)    Antibiotics Zosyn 9/22 >> stopped 9/24 Vanc 9/22>> stopped 9/24    Discharge Exam: Filed Vitals:   12/15/13 0351 12/15/13 0527 12/15/13 0700 12/15/13 0720  BP: 71/26 80/36  91/35  Pulse: 59 63    Temp: 97 F (36.1 C)  97.4 F (36.3 C)    TempSrc: Axillary  Axillary   Resp: 10 16  18   SpO2: 100% 100%  100%    General: A./O. x4, moderate pain secondary to right IJ, rated at 8/10 . Cardiovascular: Regular rhythm and rate, negative murmurs rubs or gallops, normal S1/S2 Respiratory: Diffuse coarse breath sounds, diffuse rhonchi  Discharge Instructions     Medication List    STOP taking these medications       atorvastatin 40 MG tablet  Commonly known as:  LIPITOR     cinacalcet 30 MG tablet  Commonly known as:  SENSIPAR     folic acid 1 MG tablet  Commonly known as:  FOLVITE     multivitamin Tabs tablet     omeprazole 20 MG capsule  Commonly known as:  PRILOSEC     PHOSLO 667 MG capsule  Generic drug:  calcium acetate     predniSONE 10 MG tablet  Commonly known as:  DELTASONE      TAKE these medications       albuterol (2.5 MG/3ML) 0.083% nebulizer solution  Commonly known as:  PROVENTIL  Take 2.5 mg by nebulization every 6 (six) hours as needed for wheezing or shortness of breath.     albuterol 108 (90 BASE) MCG/ACT inhaler  Commonly known as:  PROVENTIL HFA;VENTOLIN HFA  Inhale 2 puffs into the lungs every 6 (six) hours as needed for wheezing or shortness of breath.     ALPRAZolam 1 MG tablet  Commonly known as:  XANAX  Take 1 mg by mouth 2 (two) times daily as needed for anxiety.     aspirin EC 81 MG tablet  Take 1 tablet (81 mg total) by mouth daily.     benzonatate 200 MG capsule  Commonly known as:  TESSALON  Take 1 capsule (200 mg total) by mouth 3 (three) times daily as needed for cough.     budesonide-formoterol 160-4.5 MCG/ACT inhaler  Commonly known as:  SYMBICORT  Inhale 2 puffs into the lungs 2 (two) times daily.     DULoxetine 60 MG capsule  Commonly known as:  CYMBALTA  Take 60 mg by mouth daily.     gabapentin 300 MG capsule  Commonly known as:  NEURONTIN  Take 300 mg by mouth 3 (three) times daily.     LORazepam 1 MG tablet  Commonly known as:  ATIVAN  Take 1  tablet (1 mg total) by mouth every 4 (four) hours as needed for anxiety (SOB).     meloxicam 7.5 MG tablet  Commonly known as:  MOBIC  Take 7.5 mg by mouth daily.     morphine CONCENTRATE 10 mg / 0.5 ml concentrated solution  Take 0.25 mLs (5 mg total) by mouth every 2 (two) hours as needed for severe pain or shortness of breath.     scopolamine 1 MG/3DAYS  Commonly known as:  TRANSDERM-SCOP  Place 1 patch (1.5 mg total) onto the skin every 3 (three) days.     traMADol 50 MG tablet  Commonly known as:  ULTRAM  Take 1 tablet by mouth every 6 (six) hours as needed for moderate pain.  Allergies  Allergen Reactions  . Ambien [Zolpidem Tartrate] Other (See Comments)    Hallucinations   Follow-up Information   Follow up with Hospice at [facility]. (home with Hospice services)    Specialty:  Hospice and Palliative Medicine   Contact information:   [place] [address] [phone]        The results of significant diagnostics from this hospitalization (including imaging, microbiology, ancillary and laboratory) are listed below for reference.    Significant Diagnostic Studies: Dg Chest Port 1 View  12/13/2013   CLINICAL DATA:  Chest pain. Weakness. Altered level of consciousness. Labored breathing.  EXAM: PORTABLE CHEST - 1 VIEW  COMPARISON:  09/16/2013 and 08/30/2013 and 07/08/2013  FINDINGS: The patient has chronic loculated bilateral pleural effusions as well as chronic cardiomegaly and tortuosity and calcification of the thoracic aorta.  The patient has bilateral slight pulmonary edema superimposed on these findings. No acute osseous abnormality.  IMPRESSION: Pulmonary edema superimposed on chronic cardiomegaly and loculated chronic pleural effusions.   Electronically Signed   By: Rozetta Nunnery M.D.   On: 12/13/2013 16:32    Microbiology: No results found for this or any previous visit (from the past 240 hour(s)).   Labs: Basic Metabolic  Panel:  Recent Labs Lab 12/13/13 1600 12/13/13 1629  NA 138 137  K 3.7 3.5*  CL 97 100  CO2 25  --   GLUCOSE 219* 227*  BUN 35* 37*  CREATININE 5.73* 5.80*  CALCIUM 8.5  --    Liver Function Tests:  Recent Labs Lab 12/13/13 1600  AST 35  ALT 9  ALKPHOS 82  BILITOT 0.4  PROT 6.6  ALBUMIN 2.5*   No results found for this basename: LIPASE, AMYLASE,  in the last 168 hours  Recent Labs Lab 12/13/13 1800  AMMONIA 19   CBC:  Recent Labs Lab 12/13/13 1627 12/13/13 1629  WBC 5.6  --   NEUTROABS 3.8  --   HGB 11.0* 13.9  HCT 36.8* 41.0  MCV 93.2  --   PLT 167  --    Cardiac Enzymes:  Recent Labs Lab 12/13/13 1837 12/14/13 0037  TROPONINI 3.58* 4.38*   BNP: BNP (last 3 results)  Recent Labs  02/08/13 1903 04/01/13 2030 06/02/13 1426  PROBNP 27711.0* 48968.0* 45128.0*   CBG:  Recent Labs Lab 12/13/13 1924 12/13/13 2020 12/13/13 2249 12/14/13 0033 12/14/13 0331  GLUCAP 90 96 131* 146* 162*       Signed:  Dia Crawford, MD [facility] [phone] pager

## 2013-12-15 NOTE — Progress Notes (Signed)
Pt and family are requesting to sign out AMA. Pt stated " I want to die at home I'm tired I don't want to do HD any more". Pt was educated about leaving the hospital against medical advice. Pt and family were also informed about the dying process and hospice.  NP notified of pt request. Np spoke with pt and family. Pt agreed to stay till morning, but again stated he wanted to die at home and not in the hospital and wanted to get home as soon as possible. Will continue to monitor pt.

## 2013-12-21 DIAGNOSIS — I5030 Unspecified diastolic (congestive) heart failure: Secondary | ICD-10-CM

## 2013-12-21 DIAGNOSIS — E8779 Other fluid overload: Secondary | ICD-10-CM

## 2013-12-21 DIAGNOSIS — N186 End stage renal disease: Secondary | ICD-10-CM

## 2013-12-22 ENCOUNTER — Telehealth: Payer: Self-pay | Admitting: Family Medicine

## 2013-12-22 NOTE — Telephone Encounter (Signed)
Practice is aware

## 2013-12-22 DEATH — deceased

## 2014-02-23 ENCOUNTER — Ambulatory Visit: Payer: Medicare Other | Admitting: Cardiovascular Disease

## 2014-03-02 ENCOUNTER — Encounter (HOSPITAL_COMMUNITY): Payer: Self-pay | Admitting: Surgery

## 2014-04-06 ENCOUNTER — Encounter (HOSPITAL_COMMUNITY): Payer: Self-pay | Admitting: Vascular Surgery

## 2014-05-14 IMAGING — DX DG CHEST 1V PORT
1 series · 1 of 1 positions shown · non-contrast
Comparison: Portable exam 9353 hours compared to 09/10/2012

CLINICAL DATA: Pneumothorax, right chest tube

PORTABLE CHEST - 1 VIEW

[portable]
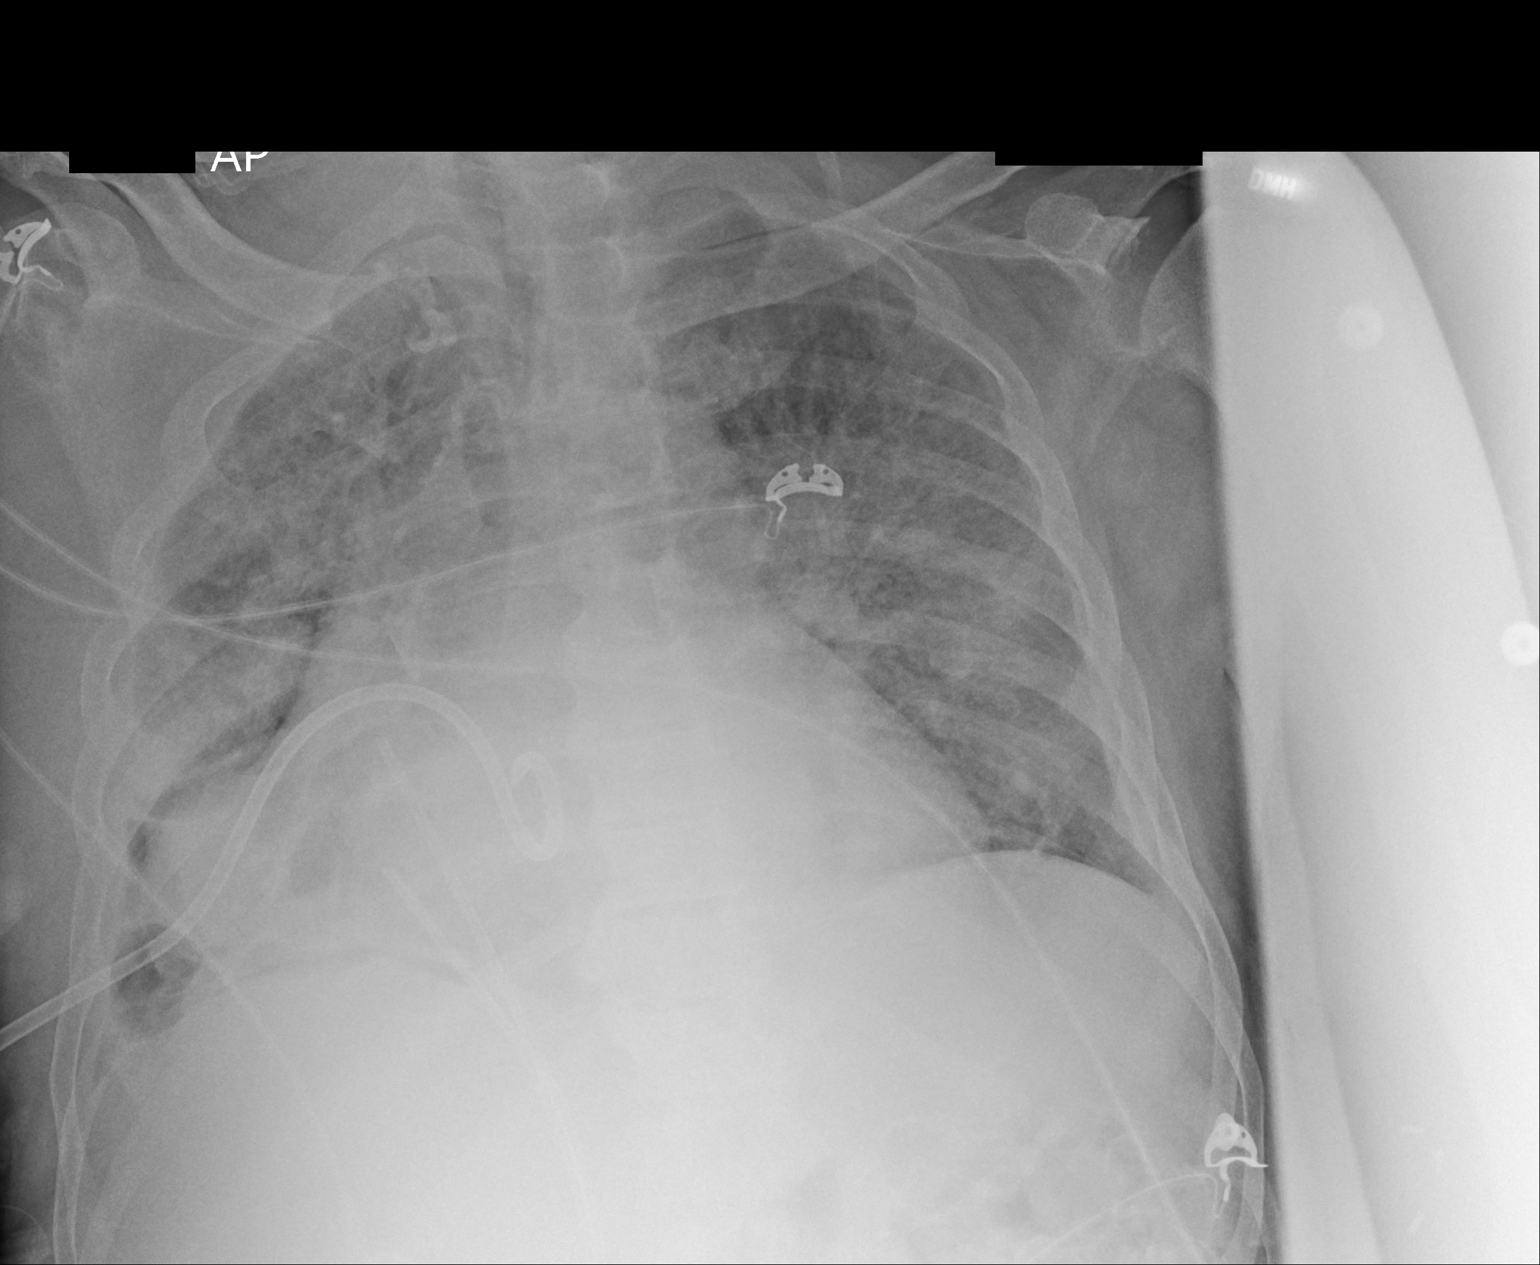

[1 of 1 positions shown; findings below may reference images not displayed]

FINDINGS: Pigtail right thoracostomy tube again seen with small residual
right basilar pneumothorax little change from previous study.
Enlargement of cardiac silhouette.
Femoral line tip projects over right atrium.
Diffuse bilateral pulmonary infiltrates question edema versus
infection.
Bones unremarkable.
IMPRESSION: Persistent small basilar right pneumothorax despite thoracostomy
tube.
Bilateral pulmonary infiltrates, little changed.
